# Patient Record
Sex: Female | Born: 1945 | Race: White | Hispanic: No | Marital: Married | Smoking: Former smoker
Health system: Southern US, Community
[De-identification: ages and names within clinical notes are randomized; demographics above are authoritative.]

## PROBLEM LIST (undated history)

## (undated) DIAGNOSIS — J189 Pneumonia, unspecified organism: Secondary | ICD-10-CM

## (undated) DIAGNOSIS — I209 Angina pectoris, unspecified: Secondary | ICD-10-CM

## (undated) DIAGNOSIS — M1990 Unspecified osteoarthritis, unspecified site: Secondary | ICD-10-CM

## (undated) DIAGNOSIS — K5720 Diverticulitis of large intestine with perforation and abscess without bleeding: Secondary | ICD-10-CM

## (undated) DIAGNOSIS — K769 Liver disease, unspecified: Secondary | ICD-10-CM

## (undated) DIAGNOSIS — K5792 Diverticulitis of intestine, part unspecified, without perforation or abscess without bleeding: Secondary | ICD-10-CM

## (undated) DIAGNOSIS — E7800 Pure hypercholesterolemia, unspecified: Secondary | ICD-10-CM

## (undated) DIAGNOSIS — I739 Peripheral vascular disease, unspecified: Secondary | ICD-10-CM

## (undated) DIAGNOSIS — G4730 Sleep apnea, unspecified: Secondary | ICD-10-CM

## (undated) DIAGNOSIS — K219 Gastro-esophageal reflux disease without esophagitis: Secondary | ICD-10-CM

## (undated) DIAGNOSIS — I499 Cardiac arrhythmia, unspecified: Secondary | ICD-10-CM

## (undated) DIAGNOSIS — M797 Fibromyalgia: Secondary | ICD-10-CM

## (undated) DIAGNOSIS — I639 Cerebral infarction, unspecified: Secondary | ICD-10-CM

## (undated) DIAGNOSIS — I2510 Atherosclerotic heart disease of native coronary artery without angina pectoris: Secondary | ICD-10-CM

## (undated) DIAGNOSIS — E119 Type 2 diabetes mellitus without complications: Secondary | ICD-10-CM

## (undated) DIAGNOSIS — I10 Essential (primary) hypertension: Secondary | ICD-10-CM

## (undated) HISTORY — PX: FRACTURE SURGERY: SHX138

## (undated) HISTORY — PX: GASTROSTOMY W/ FEEDING TUBE: SUR642

## (undated) HISTORY — DX: Pure hypercholesterolemia, unspecified: E78.00

## (undated) HISTORY — PX: COLOSTOMY: SHX63

## (undated) HISTORY — PX: CARDIAC CATHETERIZATION: SHX172

## (undated) HISTORY — DX: Diverticulitis of large intestine with perforation and abscess without bleeding: K57.20

## (undated) HISTORY — DX: Diverticulitis of intestine, part unspecified, without perforation or abscess without bleeding: K57.92

---
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
## 1984-06-11 HISTORY — PX: CORONARY ARTERY BYPASS GRAFT: SHX141

## 1994-06-11 HISTORY — PX: CHOLECYSTECTOMY: SHX55

## 1998-07-25 ENCOUNTER — Ambulatory Visit (HOSPITAL_COMMUNITY): Admission: RE | Admit: 1998-07-25 | Discharge: 1998-07-25 | Payer: Self-pay | Admitting: Family Medicine

## 2000-04-02 ENCOUNTER — Other Ambulatory Visit: Admission: RE | Admit: 2000-04-02 | Discharge: 2000-04-02 | Payer: Self-pay | Admitting: Family Medicine

## 2000-06-11 HISTORY — PX: EYE SURGERY: SHX253

## 2000-10-14 ENCOUNTER — Encounter: Payer: Self-pay | Admitting: Family Medicine

## 2000-10-14 ENCOUNTER — Encounter: Admission: RE | Admit: 2000-10-14 | Discharge: 2000-10-14 | Payer: Self-pay | Admitting: Family Medicine

## 2001-03-18 ENCOUNTER — Other Ambulatory Visit: Admission: RE | Admit: 2001-03-18 | Discharge: 2001-03-18 | Payer: Self-pay | Admitting: Family Medicine

## 2001-07-08 ENCOUNTER — Encounter: Payer: Self-pay | Admitting: Family Medicine

## 2001-07-08 ENCOUNTER — Ambulatory Visit (HOSPITAL_COMMUNITY): Admission: RE | Admit: 2001-07-08 | Discharge: 2001-07-08 | Payer: Self-pay | Admitting: Family Medicine

## 2002-01-12 ENCOUNTER — Encounter: Payer: Self-pay | Admitting: Family Medicine

## 2002-01-12 ENCOUNTER — Encounter: Admission: RE | Admit: 2002-01-12 | Discharge: 2002-01-12 | Payer: Self-pay | Admitting: Family Medicine

## 2003-04-02 ENCOUNTER — Encounter: Admission: RE | Admit: 2003-04-02 | Discharge: 2003-04-02 | Payer: Self-pay | Admitting: Family Medicine

## 2003-04-02 ENCOUNTER — Encounter: Payer: Self-pay | Admitting: Family Medicine

## 2003-04-06 ENCOUNTER — Other Ambulatory Visit: Admission: RE | Admit: 2003-04-06 | Discharge: 2003-04-06 | Payer: Self-pay | Admitting: Family Medicine

## 2005-04-09 ENCOUNTER — Other Ambulatory Visit: Admission: RE | Admit: 2005-04-09 | Discharge: 2005-04-09 | Payer: Self-pay | Admitting: Family Medicine

## 2005-09-12 ENCOUNTER — Ambulatory Visit (HOSPITAL_BASED_OUTPATIENT_CLINIC_OR_DEPARTMENT_OTHER): Admission: RE | Admit: 2005-09-12 | Discharge: 2005-09-12 | Payer: Self-pay | Admitting: Orthopedic Surgery

## 2007-01-07 ENCOUNTER — Encounter: Admission: RE | Admit: 2007-01-07 | Discharge: 2007-01-07 | Payer: Self-pay | Admitting: Family Medicine

## 2007-07-10 ENCOUNTER — Other Ambulatory Visit: Admission: RE | Admit: 2007-07-10 | Discharge: 2007-07-10 | Payer: Self-pay | Admitting: Family Medicine

## 2007-10-06 ENCOUNTER — Encounter: Admission: RE | Admit: 2007-10-06 | Discharge: 2007-10-06 | Payer: Self-pay | Admitting: Family Medicine

## 2009-05-03 ENCOUNTER — Encounter (HOSPITAL_COMMUNITY): Admission: RE | Admit: 2009-05-03 | Discharge: 2009-06-10 | Payer: Self-pay | Admitting: Unknown Physician Specialty

## 2009-06-11 DIAGNOSIS — I639 Cerebral infarction, unspecified: Secondary | ICD-10-CM

## 2009-06-11 HISTORY — DX: Cerebral infarction, unspecified: I63.9

## 2009-06-27 ENCOUNTER — Encounter (HOSPITAL_COMMUNITY): Admission: RE | Admit: 2009-06-27 | Discharge: 2009-09-25 | Payer: Self-pay | Admitting: Unknown Physician Specialty

## 2010-10-27 NOTE — Op Note (Signed)
NAME:  [name], CHRETIEN              ACCOUNT NO.:  [id number]   MEDICAL RECORD NO.:  [id number]          PATIENT TYPE:  AMB   LOCATION:  DSC                          FACILITY:  [facility]   PHYSICIAN:  Matthew A. Weingold, M.D.DATE OF BIRTH:  Aug 30, 1945   DATE OF PROCEDURE:  09/12/2005  DATE OF DISCHARGE:                                 OPERATIVE REPORT   PREOPERATIVE DIAGNOSIS:  Left distal radius malunion.   POSTOPERATIVE DIAGNOSIS:  Left distal radius malunion.   PROCEDURE:  Takedown of malunion, left distal radius, and open reduction and  internal fixation with DVR plate and screws.   SURGEONS:  1.  Artist Pais Mina Marble, M.D.  2.  Cindee Salt, M.D.   ANESTHESIA:  Axillary block.   TOURNIQUET TIME:  One hour.   COMPLICATIONS:  No complications.   DRAINS:  No drains.   OPERATIVE REPORT:  The patient was taken to the operating room and after the  induction of adequate axillary block analgesia and IV sedation, the left  upper extremity was prepped and draped in the usual sterile fashion.  An  Esmarch was used to exsanguinate the limb.  Tourniquet was inflated to 250  mmHg.  At this point in time, an FCR approach to the distal radius was  undertaken and the skin was incised for 8 cm over the palpable border of the  FCR tendon.  Skin was incised and sheath over the FCR was incised; this was  retracted to the midline.  The radial artery to the lateral-sided interval  was developed down to the level of the pronator quadratus.  The pronator  quadratus was subperiosteally stripped off the distal radius, exposing a  volarly angulated malunion of the distal radius.  The malunion was taken  down carefully using curettes, rongeurs and osteotomes.  Once this was done,  the DVR plate was fastened to the proximal aspect of the radius and a  reduction was performed.  Intraoperative fluoroscopy revealed adequate  reduction in both lateral and lateral plane.  K-wires were then used to  transfix  the distal fragment to the plate; this was then followed by  placement of the remaining cortical screws proximally and smooth pegs  distally.  Next, cancellous graft was placed into a fairly large void  created by the takedown of the malunion; this was packed tightly until  fluoroscopy revealed no lucency.  The wound was then thoroughly irrigated.  The pronator  quadratus was repaired with 3-0 Vicryl and the skin with a 3-0 Prolene  subcuticular stitch.  Steri-Strips, 4 x 4's, fluffs and a compressive  dressing were applied.  The patient tolerated the procedure well and went to  the recovery room in stable fashion.      Artist Pais Mina Marble, M.D.  Electronically Signed     MAW/MEDQ  D:  09/13/2005  T:  09/14/2005  Job:  [id number]

## 2011-01-10 DIAGNOSIS — K769 Liver disease, unspecified: Secondary | ICD-10-CM

## 2011-01-10 HISTORY — DX: Liver disease, unspecified: K76.9

## 2011-06-12 HISTORY — PX: EYE SURGERY: SHX253

## 2011-08-21 DIAGNOSIS — H409 Unspecified glaucoma: Secondary | ICD-10-CM | POA: Diagnosis not present

## 2011-08-21 DIAGNOSIS — H4011X0 Primary open-angle glaucoma, stage unspecified: Secondary | ICD-10-CM | POA: Diagnosis not present

## 2011-08-23 DIAGNOSIS — I10 Essential (primary) hypertension: Secondary | ICD-10-CM | POA: Diagnosis not present

## 2011-08-23 DIAGNOSIS — Z951 Presence of aortocoronary bypass graft: Secondary | ICD-10-CM | POA: Diagnosis not present

## 2011-08-23 DIAGNOSIS — R0602 Shortness of breath: Secondary | ICD-10-CM | POA: Diagnosis not present

## 2011-08-23 DIAGNOSIS — I2510 Atherosclerotic heart disease of native coronary artery without angina pectoris: Secondary | ICD-10-CM | POA: Diagnosis not present

## 2011-09-03 DIAGNOSIS — I10 Essential (primary) hypertension: Secondary | ICD-10-CM | POA: Diagnosis not present

## 2011-09-03 DIAGNOSIS — I25810 Atherosclerosis of coronary artery bypass graft(s) without angina pectoris: Secondary | ICD-10-CM | POA: Diagnosis not present

## 2011-09-03 DIAGNOSIS — E785 Hyperlipidemia, unspecified: Secondary | ICD-10-CM | POA: Diagnosis not present

## 2011-09-03 DIAGNOSIS — E119 Type 2 diabetes mellitus without complications: Secondary | ICD-10-CM | POA: Diagnosis not present

## 2011-09-05 DIAGNOSIS — R82998 Other abnormal findings in urine: Secondary | ICD-10-CM | POA: Diagnosis not present

## 2011-09-05 DIAGNOSIS — E119 Type 2 diabetes mellitus without complications: Secondary | ICD-10-CM | POA: Diagnosis not present

## 2011-09-05 DIAGNOSIS — H6120 Impacted cerumen, unspecified ear: Secondary | ICD-10-CM | POA: Diagnosis not present

## 2011-09-12 DIAGNOSIS — M1990 Unspecified osteoarthritis, unspecified site: Secondary | ICD-10-CM | POA: Diagnosis not present

## 2011-09-12 DIAGNOSIS — M25549 Pain in joints of unspecified hand: Secondary | ICD-10-CM | POA: Diagnosis not present

## 2011-09-12 DIAGNOSIS — M069 Rheumatoid arthritis, unspecified: Secondary | ICD-10-CM | POA: Diagnosis not present

## 2011-09-21 DIAGNOSIS — R109 Unspecified abdominal pain: Secondary | ICD-10-CM | POA: Diagnosis not present

## 2011-09-21 DIAGNOSIS — R82998 Other abnormal findings in urine: Secondary | ICD-10-CM | POA: Diagnosis not present

## 2011-10-03 DIAGNOSIS — H2510 Age-related nuclear cataract, unspecified eye: Secondary | ICD-10-CM | POA: Diagnosis not present

## 2011-10-10 DIAGNOSIS — H2510 Age-related nuclear cataract, unspecified eye: Secondary | ICD-10-CM | POA: Diagnosis not present

## 2011-10-12 DIAGNOSIS — M069 Rheumatoid arthritis, unspecified: Secondary | ICD-10-CM | POA: Diagnosis not present

## 2011-10-15 DIAGNOSIS — H269 Unspecified cataract: Secondary | ICD-10-CM | POA: Diagnosis not present

## 2011-10-15 DIAGNOSIS — IMO0002 Reserved for concepts with insufficient information to code with codable children: Secondary | ICD-10-CM | POA: Diagnosis not present

## 2011-10-15 DIAGNOSIS — H2510 Age-related nuclear cataract, unspecified eye: Secondary | ICD-10-CM | POA: Diagnosis not present

## 2011-10-22 DIAGNOSIS — H2510 Age-related nuclear cataract, unspecified eye: Secondary | ICD-10-CM | POA: Diagnosis not present

## 2011-10-29 DIAGNOSIS — IMO0002 Reserved for concepts with insufficient information to code with codable children: Secondary | ICD-10-CM | POA: Diagnosis not present

## 2011-10-29 DIAGNOSIS — H269 Unspecified cataract: Secondary | ICD-10-CM | POA: Diagnosis not present

## 2011-10-29 DIAGNOSIS — H2510 Age-related nuclear cataract, unspecified eye: Secondary | ICD-10-CM | POA: Diagnosis not present

## 2011-10-30 DIAGNOSIS — M47814 Spondylosis without myelopathy or radiculopathy, thoracic region: Secondary | ICD-10-CM | POA: Diagnosis not present

## 2011-10-30 DIAGNOSIS — M546 Pain in thoracic spine: Secondary | ICD-10-CM | POA: Diagnosis not present

## 2011-10-30 DIAGNOSIS — M069 Rheumatoid arthritis, unspecified: Secondary | ICD-10-CM | POA: Diagnosis not present

## 2011-11-06 DIAGNOSIS — M545 Low back pain: Secondary | ICD-10-CM | POA: Diagnosis not present

## 2011-11-06 DIAGNOSIS — M546 Pain in thoracic spine: Secondary | ICD-10-CM | POA: Diagnosis not present

## 2011-11-08 DIAGNOSIS — M069 Rheumatoid arthritis, unspecified: Secondary | ICD-10-CM | POA: Diagnosis not present

## 2011-11-08 DIAGNOSIS — M545 Low back pain: Secondary | ICD-10-CM | POA: Diagnosis not present

## 2011-11-08 DIAGNOSIS — Z79899 Other long term (current) drug therapy: Secondary | ICD-10-CM | POA: Diagnosis not present

## 2011-11-08 DIAGNOSIS — M546 Pain in thoracic spine: Secondary | ICD-10-CM | POA: Diagnosis not present

## 2011-11-12 DIAGNOSIS — M069 Rheumatoid arthritis, unspecified: Secondary | ICD-10-CM | POA: Diagnosis not present

## 2011-11-13 DIAGNOSIS — M545 Low back pain: Secondary | ICD-10-CM | POA: Diagnosis not present

## 2011-11-13 DIAGNOSIS — M546 Pain in thoracic spine: Secondary | ICD-10-CM | POA: Diagnosis not present

## 2011-11-15 DIAGNOSIS — M545 Low back pain: Secondary | ICD-10-CM | POA: Diagnosis not present

## 2011-11-15 DIAGNOSIS — M546 Pain in thoracic spine: Secondary | ICD-10-CM | POA: Diagnosis not present

## 2011-11-20 DIAGNOSIS — M546 Pain in thoracic spine: Secondary | ICD-10-CM | POA: Diagnosis not present

## 2011-11-20 DIAGNOSIS — M545 Low back pain: Secondary | ICD-10-CM | POA: Diagnosis not present

## 2011-11-27 DIAGNOSIS — M545 Low back pain: Secondary | ICD-10-CM | POA: Diagnosis not present

## 2011-11-27 DIAGNOSIS — M546 Pain in thoracic spine: Secondary | ICD-10-CM | POA: Diagnosis not present

## 2011-11-30 DIAGNOSIS — M546 Pain in thoracic spine: Secondary | ICD-10-CM | POA: Diagnosis not present

## 2011-11-30 DIAGNOSIS — M545 Low back pain: Secondary | ICD-10-CM | POA: Diagnosis not present

## 2011-12-04 DIAGNOSIS — M546 Pain in thoracic spine: Secondary | ICD-10-CM | POA: Diagnosis not present

## 2011-12-04 DIAGNOSIS — M545 Low back pain: Secondary | ICD-10-CM | POA: Diagnosis not present

## 2011-12-06 DIAGNOSIS — M545 Low back pain: Secondary | ICD-10-CM | POA: Diagnosis not present

## 2011-12-06 DIAGNOSIS — M546 Pain in thoracic spine: Secondary | ICD-10-CM | POA: Diagnosis not present

## 2011-12-12 DIAGNOSIS — M545 Low back pain: Secondary | ICD-10-CM | POA: Diagnosis not present

## 2011-12-12 DIAGNOSIS — M546 Pain in thoracic spine: Secondary | ICD-10-CM | POA: Diagnosis not present

## 2011-12-18 DIAGNOSIS — M546 Pain in thoracic spine: Secondary | ICD-10-CM | POA: Diagnosis not present

## 2011-12-18 DIAGNOSIS — M545 Low back pain: Secondary | ICD-10-CM | POA: Diagnosis not present

## 2011-12-24 DIAGNOSIS — M1990 Unspecified osteoarthritis, unspecified site: Secondary | ICD-10-CM | POA: Diagnosis not present

## 2011-12-24 DIAGNOSIS — M069 Rheumatoid arthritis, unspecified: Secondary | ICD-10-CM | POA: Diagnosis not present

## 2011-12-24 DIAGNOSIS — M542 Cervicalgia: Secondary | ICD-10-CM | POA: Diagnosis not present

## 2011-12-24 DIAGNOSIS — M5030 Other cervical disc degeneration, unspecified cervical region: Secondary | ICD-10-CM | POA: Diagnosis not present

## 2011-12-25 DIAGNOSIS — M546 Pain in thoracic spine: Secondary | ICD-10-CM | POA: Diagnosis not present

## 2011-12-25 DIAGNOSIS — M545 Low back pain: Secondary | ICD-10-CM | POA: Diagnosis not present

## 2011-12-28 DIAGNOSIS — R109 Unspecified abdominal pain: Secondary | ICD-10-CM | POA: Diagnosis not present

## 2011-12-28 DIAGNOSIS — R82998 Other abnormal findings in urine: Secondary | ICD-10-CM | POA: Diagnosis not present

## 2011-12-28 DIAGNOSIS — I10 Essential (primary) hypertension: Secondary | ICD-10-CM | POA: Diagnosis not present

## 2011-12-28 DIAGNOSIS — R0602 Shortness of breath: Secondary | ICD-10-CM | POA: Diagnosis not present

## 2011-12-28 DIAGNOSIS — M069 Rheumatoid arthritis, unspecified: Secondary | ICD-10-CM | POA: Diagnosis not present

## 2011-12-28 DIAGNOSIS — E119 Type 2 diabetes mellitus without complications: Secondary | ICD-10-CM | POA: Diagnosis not present

## 2012-01-03 DIAGNOSIS — R109 Unspecified abdominal pain: Secondary | ICD-10-CM | POA: Diagnosis not present

## 2012-01-03 DIAGNOSIS — K5730 Diverticulosis of large intestine without perforation or abscess without bleeding: Secondary | ICD-10-CM | POA: Diagnosis not present

## 2012-02-28 DIAGNOSIS — I10 Essential (primary) hypertension: Secondary | ICD-10-CM | POA: Diagnosis not present

## 2012-02-28 DIAGNOSIS — E7800 Pure hypercholesterolemia, unspecified: Secondary | ICD-10-CM | POA: Diagnosis not present

## 2012-02-28 DIAGNOSIS — G459 Transient cerebral ischemic attack, unspecified: Secondary | ICD-10-CM | POA: Diagnosis not present

## 2012-02-28 DIAGNOSIS — I2510 Atherosclerotic heart disease of native coronary artery without angina pectoris: Secondary | ICD-10-CM | POA: Diagnosis not present

## 2012-02-28 DIAGNOSIS — Z951 Presence of aortocoronary bypass graft: Secondary | ICD-10-CM | POA: Diagnosis not present

## 2012-02-28 DIAGNOSIS — R0602 Shortness of breath: Secondary | ICD-10-CM | POA: Diagnosis not present

## 2012-03-03 DIAGNOSIS — H43819 Vitreous degeneration, unspecified eye: Secondary | ICD-10-CM | POA: Diagnosis not present

## 2012-03-17 ENCOUNTER — Encounter (INDEPENDENT_AMBULATORY_CARE_PROVIDER_SITE_OTHER): Payer: Medicare Other | Admitting: Ophthalmology

## 2012-03-17 DIAGNOSIS — H43819 Vitreous degeneration, unspecified eye: Secondary | ICD-10-CM

## 2012-03-17 DIAGNOSIS — I10 Essential (primary) hypertension: Secondary | ICD-10-CM

## 2012-03-17 DIAGNOSIS — H4310 Vitreous hemorrhage, unspecified eye: Secondary | ICD-10-CM

## 2012-03-17 DIAGNOSIS — E1165 Type 2 diabetes mellitus with hyperglycemia: Secondary | ICD-10-CM

## 2012-03-17 DIAGNOSIS — H348192 Central retinal vein occlusion, unspecified eye, stable: Secondary | ICD-10-CM

## 2012-03-17 DIAGNOSIS — H35039 Hypertensive retinopathy, unspecified eye: Secondary | ICD-10-CM

## 2012-03-17 DIAGNOSIS — E11319 Type 2 diabetes mellitus with unspecified diabetic retinopathy without macular edema: Secondary | ICD-10-CM

## 2012-03-18 NOTE — H&P (Signed)
[name] is an 66 y.o. female.   Chief Complaint:loss of vision right eye HPI: History of Central retinal vein occlusion right eye  Now sudden loss of vision with vitreous hemorrhage right eye  No past medical history on file.  No past surgical history on file.  No family history on file. Social History:  does not have a smoking history on file. She does not have any smokeless tobacco history on file. Her alcohol and drug histories not on file.  Allergies: Allergies not on file  No prescriptions prior to admission    Review of systems otherwise negative  There were no vitals taken for this visit.  Physical exam: Mental status: oriented x3. Eyes: See eye exam associated with this date of surgery in media tab.  Scanned in by scanning center Ears, Nose, Throat: within normal limits Neck: Within Normal limits General: within normal limits Chest: Within normal limits Breast: deferred Heart: Within normal limits Abdomen: Within normal limits GU: deferred Extremities: within normal limits Skin: within normal limits  Assessment/Plan Vitreous hemorrhage Plan: To [facility] for Pars plana vitrectomy, laser therapy right eye  Sherrie George 03/18/2012, 3:29 PM

## 2012-03-19 ENCOUNTER — Encounter (HOSPITAL_COMMUNITY): Payer: Self-pay | Admitting: Pharmacy Technician

## 2012-03-20 ENCOUNTER — Ambulatory Visit (HOSPITAL_COMMUNITY): Admission: RE | Admit: 2012-03-20 | Payer: Medicare Other | Source: Ambulatory Visit

## 2012-03-20 ENCOUNTER — Encounter (HOSPITAL_COMMUNITY): Payer: Self-pay

## 2012-03-20 ENCOUNTER — Encounter (HOSPITAL_COMMUNITY)
Admission: RE | Admit: 2012-03-20 | Discharge: 2012-03-20 | Disposition: A | Discharge status: Home / Self Care | Payer: Medicare Other | Source: Ambulatory Visit | Attending: Ophthalmology | Admitting: Ophthalmology

## 2012-03-20 DIAGNOSIS — H349 Unspecified retinal vascular occlusion: Secondary | ICD-10-CM | POA: Diagnosis not present

## 2012-03-20 DIAGNOSIS — IMO0001 Reserved for inherently not codable concepts without codable children: Secondary | ICD-10-CM | POA: Diagnosis not present

## 2012-03-20 DIAGNOSIS — F17200 Nicotine dependence, unspecified, uncomplicated: Secondary | ICD-10-CM | POA: Diagnosis not present

## 2012-03-20 DIAGNOSIS — E119 Type 2 diabetes mellitus without complications: Secondary | ICD-10-CM | POA: Diagnosis not present

## 2012-03-20 DIAGNOSIS — I2510 Atherosclerotic heart disease of native coronary artery without angina pectoris: Secondary | ICD-10-CM | POA: Diagnosis not present

## 2012-03-20 DIAGNOSIS — Z23 Encounter for immunization: Secondary | ICD-10-CM | POA: Diagnosis not present

## 2012-03-20 DIAGNOSIS — K219 Gastro-esophageal reflux disease without esophagitis: Secondary | ICD-10-CM | POA: Diagnosis not present

## 2012-03-20 DIAGNOSIS — I739 Peripheral vascular disease, unspecified: Secondary | ICD-10-CM | POA: Diagnosis not present

## 2012-03-20 DIAGNOSIS — Z951 Presence of aortocoronary bypass graft: Secondary | ICD-10-CM | POA: Diagnosis not present

## 2012-03-20 DIAGNOSIS — G4730 Sleep apnea, unspecified: Secondary | ICD-10-CM | POA: Diagnosis not present

## 2012-03-20 DIAGNOSIS — H4310 Vitreous hemorrhage, unspecified eye: Secondary | ICD-10-CM | POA: Diagnosis not present

## 2012-03-20 DIAGNOSIS — I10 Essential (primary) hypertension: Secondary | ICD-10-CM | POA: Diagnosis not present

## 2012-03-20 HISTORY — DX: Liver disease, unspecified: K76.9

## 2012-03-20 HISTORY — DX: Peripheral vascular disease, unspecified: I73.9

## 2012-03-20 HISTORY — DX: Cardiac arrhythmia, unspecified: I49.9

## 2012-03-20 HISTORY — DX: Essential (primary) hypertension: I10

## 2012-03-20 HISTORY — DX: Gastro-esophageal reflux disease without esophagitis: K21.9

## 2012-03-20 HISTORY — DX: Sleep apnea, unspecified: G47.30

## 2012-03-20 HISTORY — DX: Unspecified osteoarthritis, unspecified site: M19.90

## 2012-03-20 HISTORY — DX: Cerebral infarction, unspecified: I63.9

## 2012-03-20 HISTORY — DX: Angina pectoris, unspecified: I20.9

## 2012-03-20 HISTORY — DX: Type 2 diabetes mellitus without complications: E11.9

## 2012-03-20 HISTORY — DX: Fibromyalgia: M79.7

## 2012-03-20 LAB — SURGICAL PCR SCREEN: MRSA, PCR: NEGATIVE

## 2012-03-20 LAB — CBC
HCT: 34.6 % — ABNORMAL LOW (ref 36.0–46.0)
Hemoglobin: 11.7 g/dL — ABNORMAL LOW (ref 12.0–15.0)
MCH: 33.7 pg (ref 26.0–34.0)
MCV: 99.7 fL (ref 78.0–100.0)
RBC: 3.47 MIL/uL — ABNORMAL LOW (ref 3.87–5.11)

## 2012-03-20 LAB — BASIC METABOLIC PANEL
CO2: 25 mEq/L (ref 19–32)
Chloride: 102 mEq/L (ref 96–112)
Glucose, Bld: 133 mg/dL — ABNORMAL HIGH (ref 70–99)
Potassium: 4.8 mEq/L (ref 3.5–5.1)
Sodium: 140 mEq/L (ref 135–145)

## 2012-03-20 NOTE — Pre-Procedure Instructions (Signed)
20 [name]  03/20/2012   Your procedure is scheduled on:  03/25/2012 Tuesday  Report to [facility] Short Stay Center at 0945 AM.  Call this number if you have problems the morning of surgery: [phone]   Remember:   Do not eat food or drink liquids :After Midnight.      Take these medicines the morning of surgery with A SIP OF WATER: cymbalta  nexium  prempro  lortab  Metoprolol     Do not wear jewelry, make-up or nail polish.  Do not wear lotions, powders, or perfumes. You may wear deodorant.  Do not shave 48 hours prior to surgery. Men may shave face and neck.  Do not bring valuables to the hospital.  Contacts, dentures or bridgework may not be worn into surgery.  Leave suitcase in the car. After surgery it may be brought to your room.  For patients admitted to the hospital, checkout time is 11:00 AM the day of discharge.   Patients discharged the day of surgery will not be allowed to drive home.  Name and phone number of your driver: [name] [phone]  Special Instructions: Shower using CHG 2 nights before surgery and the night before surgery.  If you shower the day of surgery use CHG.  Use special wash - you have one bottle of CHG for all showers.  You should use approximately 1/3 of the bottle for each shower.   Please read over the following fact sheets that you were given: Pain Booklet, Coughing and Deep Breathing, Lab Information, MRSA Information and Surgical Site Infection Prevention

## 2012-03-21 ENCOUNTER — Encounter (HOSPITAL_COMMUNITY): Payer: Self-pay | Admitting: Vascular Surgery

## 2012-03-21 NOTE — Consult Note (Signed)
Anesthesia Chart Review:  Patient is a 66 year old female scheduled for pars plana vitrectomy, laser therapy, right eye by Dr. Ashley Royalty on 03/25/12.  History includes morbid obesity, OSA with CPAP use, HTN, GERD, PVD, DM2, CAD s/p CABG '86, TIA, RA, "enlarged liver" by history with normal AST/ALT on 12/28/11.  PCP is Dr. Felipa Eth.  Her Cardiologist is Dr. Jacinto Halim.  He last saw her on 02/28/12.  By notes, she was doing well from a cardiac standpoint.  No changes were made in her medications, and six month follow-up was recommended.  EKG then showed NSR, first degree AVB, low voltage complexes.  Lexiscan myocardial perfusion study from 02/16/2011 showed a very small sized apical ischemia (versus gut artifact) that does not reach statistical significance, normal wall motion and endocardial thickening. EF estimated at 70%. This represents a low risk study. Continued medical therapy was recommended.  Echo at [facility] on 06/29/10 showed no LVH, normal wall motion, LVEF normal and in access of 65%. Mild LA enlargement. Trace MR. No MVP or MS. Mild MAC. No AS or AR, and no LVOT obstruction. Trace TR with no pulmonary hypertension. RVSP 21 mmHg with RA mean 10.  Her last CXR from Dr. Felipa Eth was from 09/16/09.  Since it is > 53 year old, she will need a repeat CXR on the day of surgery.  Labs noted from 03/20/12.  AST was 36 and ALT was 22 on 12/28/11 [facility]), so will not plan to get any additional labs preoperatively.  If no significant change in her status then would anticipate she can proceed as planned.  Shonna Chock, PA-C

## 2012-03-24 MED ORDER — CEFAZOLIN SODIUM-DEXTROSE 2-3 GM-% IV SOLR
2.0000 g | INTRAVENOUS | Status: AC
  Administered 2012-03-25: 2 g via INTRAVENOUS
  Filled 2012-03-24: qty 50

## 2012-03-24 MED ORDER — GATIFLOXACIN 0.5 % OP SOLN
1.0000 [drp] | OPHTHALMIC | Status: AC | PRN
  Administered 2012-03-25 (×3): 1 [drp] via OPHTHALMIC
  Filled 2012-03-24: qty 2.5

## 2012-03-24 MED ORDER — CYCLOPENTOLATE HCL 1 % OP SOLN
1.0000 [drp] | OPHTHALMIC | Status: AC | PRN
  Administered 2012-03-25 (×3): 1 [drp] via OPHTHALMIC
  Filled 2012-03-24: qty 2

## 2012-03-24 MED ORDER — TROPICAMIDE 1 % OP SOLN
1.0000 [drp] | OPHTHALMIC | Status: AC | PRN
  Administered 2012-03-25 (×3): 1 [drp] via OPHTHALMIC
  Filled 2012-03-24: qty 3

## 2012-03-24 MED ORDER — PHENYLEPHRINE HCL 2.5 % OP SOLN
1.0000 [drp] | OPHTHALMIC | Status: AC | PRN
  Administered 2012-03-25 (×3): 1 [drp] via OPHTHALMIC
  Filled 2012-03-24: qty 3

## 2012-03-24 NOTE — Progress Notes (Signed)
Spoke with [facility] state they will fax over sleep study.

## 2012-03-25 ENCOUNTER — Encounter (HOSPITAL_COMMUNITY): Payer: Self-pay | Admitting: Vascular Surgery

## 2012-03-25 ENCOUNTER — Ambulatory Visit (HOSPITAL_COMMUNITY): Payer: Medicare Other

## 2012-03-25 ENCOUNTER — Encounter (HOSPITAL_COMMUNITY): Payer: Self-pay | Admitting: General Practice

## 2012-03-25 ENCOUNTER — Encounter (HOSPITAL_COMMUNITY)
Admission: RE | Disposition: A | Discharge status: Home / Self Care | Payer: Self-pay | Source: Ambulatory Visit | Attending: Ophthalmology

## 2012-03-25 ENCOUNTER — Ambulatory Visit (HOSPITAL_COMMUNITY): Payer: Medicare Other | Admitting: Vascular Surgery

## 2012-03-25 ENCOUNTER — Ambulatory Visit (HOSPITAL_COMMUNITY)
Admission: RE | Admit: 2012-03-25 | Discharge: 2012-03-26 | Disposition: A | Discharge status: Home / Self Care | Payer: Medicare Other | Source: Ambulatory Visit | Attending: Ophthalmology | Admitting: Ophthalmology

## 2012-03-25 DIAGNOSIS — IMO0001 Reserved for inherently not codable concepts without codable children: Secondary | ICD-10-CM | POA: Insufficient documentation

## 2012-03-25 DIAGNOSIS — Z951 Presence of aortocoronary bypass graft: Secondary | ICD-10-CM | POA: Insufficient documentation

## 2012-03-25 DIAGNOSIS — G4710 Hypersomnia, unspecified: Secondary | ICD-10-CM | POA: Diagnosis not present

## 2012-03-25 DIAGNOSIS — K219 Gastro-esophageal reflux disease without esophagitis: Secondary | ICD-10-CM | POA: Diagnosis not present

## 2012-03-25 DIAGNOSIS — G4730 Sleep apnea, unspecified: Secondary | ICD-10-CM | POA: Insufficient documentation

## 2012-03-25 DIAGNOSIS — I739 Peripheral vascular disease, unspecified: Secondary | ICD-10-CM | POA: Insufficient documentation

## 2012-03-25 DIAGNOSIS — H4310 Vitreous hemorrhage, unspecified eye: Secondary | ICD-10-CM | POA: Diagnosis not present

## 2012-03-25 DIAGNOSIS — I2510 Atherosclerotic heart disease of native coronary artery without angina pectoris: Secondary | ICD-10-CM | POA: Insufficient documentation

## 2012-03-25 DIAGNOSIS — I10 Essential (primary) hypertension: Secondary | ICD-10-CM | POA: Insufficient documentation

## 2012-03-25 DIAGNOSIS — Z01811 Encounter for preprocedural respiratory examination: Secondary | ICD-10-CM | POA: Diagnosis not present

## 2012-03-25 DIAGNOSIS — E119 Type 2 diabetes mellitus without complications: Secondary | ICD-10-CM | POA: Diagnosis not present

## 2012-03-25 DIAGNOSIS — H348192 Central retinal vein occlusion, unspecified eye, stable: Secondary | ICD-10-CM | POA: Diagnosis not present

## 2012-03-25 DIAGNOSIS — F17200 Nicotine dependence, unspecified, uncomplicated: Secondary | ICD-10-CM | POA: Insufficient documentation

## 2012-03-25 DIAGNOSIS — H349 Unspecified retinal vascular occlusion: Secondary | ICD-10-CM | POA: Diagnosis not present

## 2012-03-25 HISTORY — PX: PARS PLANA VITRECTOMY: SHX2166

## 2012-03-25 HISTORY — PX: GAS/FLUID EXCHANGE: SHX5334

## 2012-03-25 LAB — GLUCOSE, CAPILLARY
Glucose-Capillary: 134 mg/dL — ABNORMAL HIGH (ref 70–99)
Glucose-Capillary: 257 mg/dL — ABNORMAL HIGH (ref 70–99)

## 2012-03-25 SURGERY — PARS PLANA VITRECTOMY WITH 25 GAUGE
Anesthesia: General | Site: Eye | Laterality: Right | Wound class: Clean

## 2012-03-25 MED ORDER — LIDOCAINE HCL (CARDIAC) 20 MG/ML IV SOLN
INTRAVENOUS | Status: DC | PRN
  Administered 2012-03-25: 20 mg via INTRAVENOUS

## 2012-03-25 MED ORDER — ACETAZOLAMIDE SODIUM 500 MG IJ SOLR
500.0000 mg | Freq: Once | INTRAMUSCULAR | Status: AC
  Administered 2012-03-26: 500 mg via INTRAVENOUS
  Filled 2012-03-25: qty 500

## 2012-03-25 MED ORDER — HYPROMELLOSE (GONIOSCOPIC) 2.5 % OP SOLN
OPHTHALMIC | Status: AC
  Filled 2012-03-25: qty 15

## 2012-03-25 MED ORDER — BSS IO SOLN
INTRAOCULAR | Status: AC
  Filled 2012-03-25: qty 15

## 2012-03-25 MED ORDER — SODIUM CHLORIDE 0.9 % IJ SOLN
INTRAMUSCULAR | Status: DC | PRN
  Administered 2012-03-25: 12:00:00

## 2012-03-25 MED ORDER — GATIFLOXACIN 0.5 % OP SOLN
1.0000 [drp] | Freq: Four times a day (QID) | OPHTHALMIC | Status: DC
  Filled 2012-03-25: qty 2.5

## 2012-03-25 MED ORDER — BUPIVACAINE HCL 0.75 % IJ SOLN
INTRAMUSCULAR | Status: AC
  Filled 2012-03-25: qty 10

## 2012-03-25 MED ORDER — TOFACITINIB CITRATE 5 MG PO TABS: 5.0000 mg | ORAL_TABLET | Freq: Two times a day (BID) | ORAL | Status: DC

## 2012-03-25 MED ORDER — BACITRACIN-POLYMYXIN B 500-10000 UNIT/GM OP OINT
1.0000 "application " | TOPICAL_OINTMENT | Freq: Four times a day (QID) | OPHTHALMIC | Status: DC
  Filled 2012-03-25: qty 3.5

## 2012-03-25 MED ORDER — MORPHINE SULFATE 2 MG/ML IJ SOLN: 1.0000 mg | INTRAMUSCULAR | Status: DC | PRN

## 2012-03-25 MED ORDER — SODIUM CHLORIDE 0.45 % IV SOLN
INTRAVENOUS | Status: DC
  Administered 2012-03-25: 15:00:00 via INTRAVENOUS

## 2012-03-25 MED ORDER — HYDROCODONE-ACETAMINOPHEN 5-325 MG PO TABS
1.0000 | ORAL_TABLET | ORAL | Status: DC | PRN
  Administered 2012-03-25: 2 via ORAL
  Administered 2012-03-25: 1 via ORAL
  Filled 2012-03-25: qty 2
  Filled 2012-03-25: qty 1

## 2012-03-25 MED ORDER — HYALURONIDASE HUMAN 150 UNIT/ML IJ SOLN
INTRAMUSCULAR | Status: AC
  Filled 2012-03-25: qty 1

## 2012-03-25 MED ORDER — METOPROLOL SUCCINATE ER 25 MG PO TB24
25.0000 mg | ORAL_TABLET | Freq: Every day | ORAL | Status: DC
  Filled 2012-03-25: qty 1

## 2012-03-25 MED ORDER — HYDROCODONE-ACETAMINOPHEN 5-325 MG PO TABS: 1.0000 | ORAL_TABLET | ORAL | Status: DC | PRN

## 2012-03-25 MED ORDER — SODIUM CHLORIDE 0.9 % IV SOLN
INTRAVENOUS | Status: DC
  Administered 2012-03-25 (×2): via INTRAVENOUS

## 2012-03-25 MED ORDER — DULOXETINE HCL 60 MG PO CPEP
60.0000 mg | ORAL_CAPSULE | Freq: Every day | ORAL | Status: DC
  Filled 2012-03-25: qty 1

## 2012-03-25 MED ORDER — SPIRONOLACTONE 25 MG PO TABS
25.0000 mg | ORAL_TABLET | Freq: Every day | ORAL | Status: DC
  Filled 2012-03-25: qty 1

## 2012-03-25 MED ORDER — EPINEPHRINE HCL 1 MG/ML IJ SOLN
INTRAOCULAR | Status: DC | PRN
  Administered 2012-03-25: 12:00:00

## 2012-03-25 MED ORDER — AMLODIPINE BESYLATE 5 MG PO TABS
5.0000 mg | ORAL_TABLET | Freq: Every day | ORAL | Status: DC
  Filled 2012-03-25: qty 1

## 2012-03-25 MED ORDER — TETRACAINE HCL 0.5 % OP SOLN
2.0000 [drp] | Freq: Once | OPHTHALMIC | Status: DC
  Filled 2012-03-25: qty 2

## 2012-03-25 MED ORDER — OXYCODONE HCL 5 MG/5ML PO SOLN: 5.0000 mg | Freq: Once | ORAL | Status: DC | PRN

## 2012-03-25 MED ORDER — BRIMONIDINE TARTRATE 0.2 % OP SOLN
1.0000 [drp] | Freq: Two times a day (BID) | OPHTHALMIC | Status: DC
  Filled 2012-03-25: qty 5

## 2012-03-25 MED ORDER — HYDROMORPHONE HCL PF 1 MG/ML IJ SOLN: 0.2500 mg | INTRAMUSCULAR | Status: DC | PRN

## 2012-03-25 MED ORDER — ACETAZOLAMIDE SODIUM 500 MG IJ SOLR
INTRAMUSCULAR | Status: AC
  Filled 2012-03-25: qty 500

## 2012-03-25 MED ORDER — MINERAL OIL LIGHT 100 % EX OIL
TOPICAL_OIL | CUTANEOUS | Status: AC
  Filled 2012-03-25: qty 25

## 2012-03-25 MED ORDER — ASPIRIN EC 81 MG PO TBEC
81.0000 mg | DELAYED_RELEASE_TABLET | Freq: Every day | ORAL | Status: DC
  Filled 2012-03-25 (×2): qty 1

## 2012-03-25 MED ORDER — ATROPINE SULFATE 1 % OP SOLN
OPHTHALMIC | Status: AC
  Filled 2012-03-25: qty 2

## 2012-03-25 MED ORDER — MIDAZOLAM HCL 2 MG/2ML IJ SOLN: 0.5000 mg | Freq: Once | INTRAMUSCULAR | Status: DC | PRN

## 2012-03-25 MED ORDER — SODIUM HYALURONATE 10 MG/ML IO SOLN
INTRAOCULAR | Status: AC
  Filled 2012-03-25: qty 0.85

## 2012-03-25 MED ORDER — ONDANSETRON HCL 4 MG/2ML IJ SOLN: 4.0000 mg | Freq: Four times a day (QID) | INTRAMUSCULAR | Status: DC | PRN

## 2012-03-25 MED ORDER — MAGNESIUM HYDROXIDE 400 MG/5ML PO SUSP: 15.0000 mL | Freq: Four times a day (QID) | ORAL | Status: DC | PRN

## 2012-03-25 MED ORDER — INSULIN ASPART 100 UNIT/ML ~~LOC~~ SOLN
0.0000 [IU] | SUBCUTANEOUS | Status: DC
  Administered 2012-03-25: 5 [IU] via SUBCUTANEOUS
  Administered 2012-03-25: 8 [IU] via SUBCUTANEOUS
  Administered 2012-03-26: 3 [IU] via SUBCUTANEOUS

## 2012-03-25 MED ORDER — BSS PLUS IO SOLN
INTRAOCULAR | Status: AC
  Filled 2012-03-25: qty 500

## 2012-03-25 MED ORDER — INSULIN GLARGINE 100 UNIT/ML ~~LOC~~ SOLN
5.0000 [IU] | Freq: Every day | SUBCUTANEOUS | Status: DC
  Administered 2012-03-25: 5 [IU] via SUBCUTANEOUS

## 2012-03-25 MED ORDER — TAFLUPROST 0.0015 % OP SOLN: 1.0000 [drp] | Freq: Every day | OPHTHALMIC | Status: DC

## 2012-03-25 MED ORDER — HYDROCHLOROTHIAZIDE 25 MG PO TABS
25.0000 mg | ORAL_TABLET | Freq: Every day | ORAL | Status: DC
  Filled 2012-03-25: qty 1

## 2012-03-25 MED ORDER — GENTAMICIN SULFATE 40 MG/ML IJ SOLN
INTRAMUSCULAR | Status: AC
  Filled 2012-03-25: qty 2

## 2012-03-25 MED ORDER — METHOTREXATE 2.5 MG PO TABS: 12.5000 mg | ORAL_TABLET | ORAL | Status: DC

## 2012-03-25 MED ORDER — TEMAZEPAM 15 MG PO CAPS
15.0000 mg | ORAL_CAPSULE | Freq: Every evening | ORAL | Status: DC | PRN
  Administered 2012-03-25: 15 mg via ORAL
  Filled 2012-03-25: qty 1

## 2012-03-25 MED ORDER — PROMETHAZINE HCL 25 MG/ML IJ SOLN
6.2500 mg | INTRAMUSCULAR | Status: DC | PRN
Start: 2012-03-25 — End: 2012-03-25

## 2012-03-25 MED ORDER — INFLUENZA VIRUS VACC SPLIT PF IM SUSP
0.5000 mL | INTRAMUSCULAR | Status: AC
  Administered 2012-03-26: 0.5 mL via INTRAMUSCULAR
  Filled 2012-03-25: qty 0.5

## 2012-03-25 MED ORDER — OXYCODONE HCL 5 MG PO TABS: 5.0000 mg | ORAL_TABLET | Freq: Once | ORAL | Status: DC | PRN

## 2012-03-25 MED ORDER — ACETAMINOPHEN 325 MG PO TABS: 325.0000 mg | ORAL_TABLET | ORAL | Status: DC | PRN

## 2012-03-25 MED ORDER — PREDNISOLONE ACETATE 1 % OP SUSP
1.0000 [drp] | Freq: Four times a day (QID) | OPHTHALMIC | Status: DC
  Filled 2012-03-25: qty 1

## 2012-03-25 MED ORDER — PROPOFOL 10 MG/ML IV BOLUS
INTRAVENOUS | Status: DC | PRN
  Administered 2012-03-25: 30 mg via INTRAVENOUS
  Administered 2012-03-25: 130 mg via INTRAVENOUS

## 2012-03-25 MED ORDER — ATROPINE SULFATE 1 % OP SOLN
OPHTHALMIC | Status: DC | PRN
  Administered 2012-03-25: 1 [drp] via OPHTHALMIC

## 2012-03-25 MED ORDER — EPINEPHRINE HCL 1 MG/ML IJ SOLN
INTRAMUSCULAR | Status: AC
  Filled 2012-03-25: qty 1

## 2012-03-25 MED ORDER — POLYMYXIN B SULFATE 500000 UNITS IJ SOLR
INTRAMUSCULAR | Status: AC
  Filled 2012-03-25: qty 1

## 2012-03-25 MED ORDER — PANTOPRAZOLE SODIUM 40 MG PO TBEC: 40.0000 mg | DELAYED_RELEASE_TABLET | Freq: Every day | ORAL | Status: DC

## 2012-03-25 MED ORDER — PRENATAL 27-0.8 MG PO TABS
1.0000 | ORAL_TABLET | Freq: Every day | ORAL | Status: DC
  Filled 2012-03-25: qty 1

## 2012-03-25 MED ORDER — SUCCINYLCHOLINE CHLORIDE 20 MG/ML IJ SOLN
INTRAMUSCULAR | Status: DC | PRN
  Administered 2012-03-25: 100 mg via INTRAVENOUS

## 2012-03-25 MED ORDER — IRBESARTAN 300 MG PO TABS
300.0000 mg | ORAL_TABLET | Freq: Every day | ORAL | Status: DC
  Filled 2012-03-25: qty 1

## 2012-03-25 MED ORDER — LIDOCAINE HCL 2 % IJ SOLN
INTRAMUSCULAR | Status: AC
  Filled 2012-03-25: qty 20

## 2012-03-25 MED ORDER — ONDANSETRON HCL 4 MG/2ML IJ SOLN
INTRAMUSCULAR | Status: DC | PRN
  Administered 2012-03-25: 4 mg via INTRAVENOUS

## 2012-03-25 MED ORDER — MEPERIDINE HCL 25 MG/ML IJ SOLN: 6.2500 mg | INTRAMUSCULAR | Status: DC | PRN

## 2012-03-25 MED ORDER — BACITRACIN-POLYMYXIN B 500-10000 UNIT/GM OP OINT
TOPICAL_OINTMENT | OPHTHALMIC | Status: AC
  Filled 2012-03-25: qty 3.5

## 2012-03-25 MED ORDER — BUPIVACAINE HCL 0.75 % IJ SOLN
INTRAMUSCULAR | Status: DC | PRN
  Administered 2012-03-25: 10 mL

## 2012-03-25 MED ORDER — WHITE PETROLATUM GEL
Status: AC
  Administered 2012-03-25: 22:00:00
  Filled 2012-03-25: qty 5

## 2012-03-25 MED ORDER — BACITRACIN-POLYMYXIN B 500-10000 UNIT/GM OP OINT
TOPICAL_OINTMENT | OPHTHALMIC | Status: DC | PRN
  Administered 2012-03-25: 1 via OPHTHALMIC

## 2012-03-25 MED ORDER — LATANOPROST 0.005 % OP SOLN
1.0000 [drp] | Freq: Every day | OPHTHALMIC | Status: DC
  Filled 2012-03-25: qty 2.5

## 2012-03-25 MED ORDER — BSS IO SOLN
INTRAOCULAR | Status: DC | PRN
  Administered 2012-03-25: 15 mL via INTRAOCULAR

## 2012-03-25 MED ORDER — TEMAZEPAM 15 MG PO CAPS: 30.0000 mg | ORAL_CAPSULE | Freq: Every day | ORAL | Status: DC

## 2012-03-25 MED ORDER — OLMESARTAN-AMLODIPINE-HCTZ 40-5-25 MG PO TABS: 1.0000 | ORAL_TABLET | Freq: Every day | ORAL | Status: DC

## 2012-03-25 MED ORDER — FENTANYL CITRATE 0.05 MG/ML IJ SOLN
INTRAMUSCULAR | Status: DC | PRN
  Administered 2012-03-25: 100 ug via INTRAVENOUS
  Administered 2012-03-25: 50 ug via INTRAVENOUS

## 2012-03-25 MED ORDER — DOCUSATE SODIUM 100 MG PO CAPS
100.0000 mg | ORAL_CAPSULE | Freq: Two times a day (BID) | ORAL | Status: DC
  Administered 2012-03-25: 100 mg via ORAL
  Filled 2012-03-25: qty 1

## 2012-03-25 MED ORDER — DEXAMETHASONE SODIUM PHOSPHATE 10 MG/ML IJ SOLN
INTRAMUSCULAR | Status: AC
  Filled 2012-03-25: qty 1

## 2012-03-25 SURGICAL SUPPLY — 59 items
APPLICATOR DR MATTHEWS STRL (MISCELLANEOUS) IMPLANT
BLADE EYE CATARACT 19 1.4 BEAV (BLADE) IMPLANT
BLADE MVR KNIFE 19G (BLADE) IMPLANT
BLADE MVR KNIFE 20G (BLADE) IMPLANT
CANNULA DUAL BORE 23G (CANNULA) IMPLANT
CANNULA FLEX TIP 25G (CANNULA) IMPLANT
CLOTH BEACON ORANGE TIMEOUT ST (SAFETY) ×2 IMPLANT
CORDS BIPOLAR (ELECTRODE) IMPLANT
COTTONBALL LRG STERILE PKG (GAUZE/BANDAGES/DRESSINGS) ×6 IMPLANT
DRAPE INCISE 51X51 W/FILM STRL (DRAPES) IMPLANT
DRAPE OPHTHALMIC 77X100 STRL (CUSTOM PROCEDURE TRAY) ×2 IMPLANT
FILTER BLUE MILLIPORE (MISCELLANEOUS) ×2 IMPLANT
FORCEPS ECKARDT ILM 25G SERR (OPHTHALMIC RELATED) IMPLANT
GLOVE SS BIOGEL STRL SZ 6.5 (GLOVE) ×1 IMPLANT
GLOVE SS BIOGEL STRL SZ 7 (GLOVE) ×1 IMPLANT
GLOVE SUPERSENSE BIOGEL SZ 6.5 (GLOVE) ×1
GLOVE SUPERSENSE BIOGEL SZ 7 (GLOVE) ×1
GLOVE SURG 8.5 LATEX PF (GLOVE) ×2 IMPLANT
GOWN STRL NON-REIN LRG LVL3 (GOWN DISPOSABLE) ×6 IMPLANT
ILLUMINATOR CHOW PICK 25GA (MISCELLANEOUS) ×2 IMPLANT
KIT BASIN OR (CUSTOM PROCEDURE TRAY) ×2 IMPLANT
KIT ROOM TURNOVER OR (KITS) ×2 IMPLANT
KNIFE CRESCENT 2.5 55 ANG (BLADE) IMPLANT
LENS BIOM SUPER VIEW SET DISP (OPHTHALMIC RELATED) IMPLANT
MARKER SKIN DUAL TIP RULER LAB (MISCELLANEOUS) IMPLANT
MASK EYE SHIELD (GAUZE/BANDAGES/DRESSINGS) ×2 IMPLANT
MICROPICK 25G (MISCELLANEOUS)
NEEDLE 18GX1X1/2 (RX/OR ONLY) (NEEDLE) ×2 IMPLANT
NEEDLE 25GX 5/8IN NON SAFETY (NEEDLE) ×2 IMPLANT
NEEDLE 27GAX1X1/2 (NEEDLE) IMPLANT
NEEDLE FILTER BLUNT 18X 1/2SAF (NEEDLE) ×1
NEEDLE FILTER BLUNT 18X1 1/2 (NEEDLE) ×1 IMPLANT
NEEDLE HYPO 30X.5 LL (NEEDLE) ×6 IMPLANT
NS IRRIG 1000ML POUR BTL (IV SOLUTION) ×2 IMPLANT
PACK VITRECTOMY CUSTOM (CUSTOM PROCEDURE TRAY) ×2 IMPLANT
PAD ARMBOARD 7.5X6 YLW CONV (MISCELLANEOUS) ×4 IMPLANT
PAD EYE OVAL STERILE LF (GAUZE/BANDAGES/DRESSINGS) ×2 IMPLANT
PAK VITRECTOMY PIK 25 GA (OPHTHALMIC RELATED) ×2 IMPLANT
PENCIL BIPOLAR 25GA STR DISP (OPHTHALMIC RELATED) IMPLANT
PICK MICROPICK 25G (MISCELLANEOUS) IMPLANT
PROBE DIRECTIONAL LASER (MISCELLANEOUS) ×2 IMPLANT
PROBE VITREOUS 25+ ACCURUS (MISCELLANEOUS) ×2 IMPLANT
ROLLS DENTAL (MISCELLANEOUS) ×4 IMPLANT
SPONGE SURGIFOAM ABS GEL 12-7 (HEMOSTASIS) ×2 IMPLANT
STOPCOCK 4 WAY LG BORE MALE ST (IV SETS) IMPLANT
SUT CHROMIC 7 0 TG140 8 (SUTURE) IMPLANT
SUT ETHILON 10 0 CS140 6 (SUTURE) IMPLANT
SUT ETHILON 9 0 TG140 8 (SUTURE) IMPLANT
SYR 20CC LL (SYRINGE) ×2 IMPLANT
SYR 5ML LL (SYRINGE) IMPLANT
SYR BULB 3OZ (MISCELLANEOUS) ×2 IMPLANT
SYR TB 1ML LUER SLIP (SYRINGE) ×2 IMPLANT
SYRINGE 10CC LL (SYRINGE) IMPLANT
TAPE SURG TRANSPORE 1 IN (GAUZE/BANDAGES/DRESSINGS) ×1 IMPLANT
TAPE SURGICAL TRANSPORE 1 IN (GAUZE/BANDAGES/DRESSINGS) ×1
TOWEL OR 17X24 6PK STRL BLUE (TOWEL DISPOSABLE) ×4 IMPLANT
TROCAR CANNULA 25GA (CANNULA) ×2 IMPLANT
WATER STERILE IRR 1000ML POUR (IV SOLUTION) ×2 IMPLANT
WIPE INSTRUMENT VISIWIPE 73X73 (MISCELLANEOUS) ×2 IMPLANT

## 2012-03-25 NOTE — H&P (Signed)
I examined the patient today and there is no change in the medical status 

## 2012-03-25 NOTE — Anesthesia Preprocedure Evaluation (Addendum)
Anesthesia Evaluation  Patient identified by MRN, date of birth, ID band Patient awake    Reviewed: Allergy & Precautions, H&P , NPO status , Patient's Chart, lab work & pertinent test results, reviewed documented beta blocker date and time   History of Anesthesia Complications Negative for: history of anesthetic complications  Airway Mallampati: II TM Distance: >3 FB Neck ROM: Full    Dental  (+) Edentulous Upper and Edentulous Lower   Pulmonary sleep apnea and Continuous Positive Airway Pressure Ventilation , former smoker (quit 30 year ago),  breath sounds clear to auscultation  Pulmonary exam normal       Cardiovascular hypertension, Pt. on medications and Pt. on home beta blockers - angina+ CAD, + CABG and + Peripheral Vascular Disease - dysrhythmias Rhythm:Regular Rate:Normal  '12 myoview: small apical ischemia vs artifact, EF 70%, low risk scan   Neuro/Psych TIAnegative psych ROS   GI/Hepatic Neg liver ROS, GERD-  Medicated and Controlled,  Endo/Other  diabetes (diet controlled, glu 134), Well Controlled, Type obesity  Renal/GU negative Renal ROS     Musculoskeletal  (+) Fibromyalgia -, narcotic dependent  Abdominal (+) + obese,   Peds  Hematology   Anesthesia Other Findings   Reproductive/Obstetrics                          Anesthesia Physical Anesthesia Plan  ASA: III  Anesthesia Plan: General   Post-op Pain Management:    Induction: Intravenous  Airway Management Planned: Oral ETT  Additional Equipment:   Intra-op Plan:   Post-operative Plan: Extubation in OR  Informed Consent: I have reviewed the patients History and Physical, chart, labs and discussed the procedure including the risks, benefits and alternatives for the proposed anesthesia with the patient or authorized representative who has indicated his/her understanding and acceptance.   Dental advisory  given  Plan Discussed with: CRNA and Surgeon  Anesthesia Plan Comments: (Plan routine monitors, GETA)        Anesthesia Quick Evaluation

## 2012-03-25 NOTE — Progress Notes (Signed)
Dentures returned to pt per pts request 

## 2012-03-25 NOTE — Brief Op Note (Signed)
Brief Operative note   Preoperative diagnosis:  Pre-Op Diagnosis Codes:    * Vitreous hemorrhage [379.23] Postoperative diagnosis  Post-Op Diagnosis Codes:    * Vitreous hemorrhage [379.23]  Procedures: Pars plana vitrectomy right eye with laser and gas injection  Surgeon:  Sherrie George, MD...  Assistant:  Rosalie Doctor SA   Anesthesia: General  Specimen: none  Estimated blood loss:  1cc  Complications: none  Patient sent to PACU in good condition  Composed by Sherrie George MD  Dictation number: [id number]

## 2012-03-25 NOTE — Transfer of Care (Signed)
Immediate Anesthesia Transfer of Care Note  Patient: [name]  Procedure(s) Performed: Procedure(s) (LRB) with comments: PARS PLANA VITRECTOMY WITH 25 GAUGE (Right) GAS/FLUID EXCHANGE (Right) PHOTOCOAGULATION WITH LASER (Right)  Patient Location: PACU  Anesthesia Type: General  Level of Consciousness: awake, alert  and oriented  Airway & Oxygen Therapy: Patient Spontanous Breathing and Patient connected to nasal cannula oxygen  Post-op Assessment: Report given to PACU RN and Post -op Vital signs reviewed and stable  Post vital signs: Reviewed and stable  Complications: No apparent anesthesia complications

## 2012-03-25 NOTE — Anesthesia Procedure Notes (Signed)
Procedure Name: Intubation Date/Time: 03/25/2012 12:03 PM Performed by: Arlice Colt B Pre-anesthesia Checklist: Patient identified, Emergency Drugs available, Suction available, Patient being monitored and Timeout performed Patient Re-evaluated:Patient Re-evaluated prior to inductionOxygen Delivery Method: Circle system utilized Preoxygenation: Pre-oxygenation with 100% oxygen Intubation Type: IV induction and Rapid sequence Laryngoscope Size: Mac and 3 Grade View: Grade I Tube type: Oral Tube size: 7.5 mm Number of attempts: 1 Airway Equipment and Method: Stylet Placement Confirmation: ETT inserted through vocal cords under direct vision,  positive ETCO2 and breath sounds checked- equal and bilateral Secured at: 21 cm Tube secured with: Tape Dental Injury: Teeth and Oropharynx as per pre-operative assessment

## 2012-03-25 NOTE — Anesthesia Postprocedure Evaluation (Signed)
  Anesthesia Post-op Note  Patient: [name]  Procedure(s) Performed: Procedure(s) (LRB) with comments: PARS PLANA VITRECTOMY WITH 25 GAUGE (Right) GAS/FLUID EXCHANGE (Right) PHOTOCOAGULATION WITH LASER (Right)  Patient Location: PACU  Anesthesia Type: General  Level of Consciousness: awake, alert , oriented and patient cooperative  Airway and Oxygen Therapy: Patient Spontanous Breathing  Post-op Pain: none  Post-op Assessment: Post-op Vital signs reviewed, Patient's Cardiovascular Status Stable, Respiratory Function Stable, Patent Airway, No signs of Nausea or vomiting and Pain level controlled  Post-op Vital Signs: Reviewed and stable  Complications: No apparent anesthesia complications

## 2012-03-26 ENCOUNTER — Encounter (HOSPITAL_COMMUNITY): Payer: Self-pay | Admitting: Ophthalmology

## 2012-03-26 LAB — GLUCOSE, CAPILLARY

## 2012-03-26 MED ORDER — BACITRACIN-POLYMYXIN B 500-10000 UNIT/GM OP OINT: 1.0000 "application " | TOPICAL_OINTMENT | Freq: Four times a day (QID) | OPHTHALMIC | Status: DC

## 2012-03-26 MED ORDER — GATIFLOXACIN 0.5 % OP SOLN: 1.0000 [drp] | Freq: Four times a day (QID) | OPHTHALMIC | Status: DC

## 2012-03-26 MED ORDER — LATANOPROST 0.005 % OP SOLN: 1.0000 [drp] | Freq: Every day | OPHTHALMIC | Status: DC

## 2012-03-26 MED ORDER — BRIMONIDINE TARTRATE 0.2 % OP SOLN: 1.0000 [drp] | Freq: Two times a day (BID) | OPHTHALMIC | Status: DC

## 2012-03-26 MED ORDER — PREDNISOLONE ACETATE 1 % OP SUSP: 1.0000 [drp] | Freq: Four times a day (QID) | OPHTHALMIC | Status: DC

## 2012-03-26 NOTE — Op Note (Signed)
NAMENNENNA, MEADOR NO.:  [id number]  MEDICAL RECORD NO.:  [id number]  LOCATION:  [id number]                        FACILITY:  [facility]  PHYSICIAN:  Ainslie Mazurek D. Ashley Royalty, M.D. DATE OF BIRTH:  1945/06/30  DATE OF PROCEDURE:  03/25/2012 DATE OF DISCHARGE:                              OPERATIVE REPORT   ADMISSION DIAGNOSES:  Vitreous hemorrhage, central retinal vein occlusion, right eye.  PROCEDURES:  Pars plana vitrectomy with retinal photocoagulation gas fluid exchange, right eye.  SURGEON:  Beulah Gandy. Ashley Royalty, M.D.  ASSISTANT:  Rosalie Doctor, SA.  ANESTHESIA:  General.  DETAILS:  Usual prep and drape, 25-gauge trocars placed at 8, 10, and 2 o'clock.  Provisc placed on the corneal surface and the biome viewing system was moved into place.  Infusion was placed at 8 o'clock.  Lighted pick and the cutter were placed at 10 and 2 o'clock respectively.  Pars plana vitrectomy was begun just behind the pseudophakos where dark red blood was encountered.  This was carefully removed under low suction and rapid cutting.  The vitrectomy was carried down to the macular surface and core vitrectomy was performed.  Some previous laser marks were seen. The vitrectomy was carried in the mid periphery where surface proliferation was removed with the vitreous cutter and the pick.  The vitrectomy is carried to the far periphery where the vitreous base was examined and all blood was removed from the vitreous base.  Scleral depression was used at 6 o'clock.  The wide field viewing system was then moved into place.  Additional vitrectomy was carried out removing all vitreous from the anterior vitreous areas.  Once this was accomplished, the endolaser was positioned in the eye, 1219 burns were placed around the periphery.  The power was 1000 mW 1000 microns each and 0.1 seconds each.  A 30% gas fluid exchange was performed.  The instruments were removed from the eye.  The 25-gauge trocars  were removed.  The wounds were held until tight and tested until secure. Polymyxin and gentamicin were irrigated into tenon space, atropine solution was applied.  Closing pressure was 10 with a Risk manager. Marcaine was injected around the globe for postop pain.  Decadron 10 mg was injected into the lower subconjunctival space.  Polysporin ophthalmic ointment and patch and shield were placed.  The patient was awakened and taken to recovery in satisfactory condition.  COMPLICATIONS:  None.  DURATION:  1 hour.     Beulah Gandy. Ashley Royalty, M.D.     JDM/MEDQ  D:  03/25/2012  T:  03/26/2012  Job:  [id number]

## 2012-03-26 NOTE — Progress Notes (Signed)
Patient discharged to home with husband.  Discharge teaching completed including follow up care, medications, activity and eye care.  Verbalizes understanding with no further questions.  Vital signs stable, no complaints of pain.

## 2012-03-26 NOTE — Discharge Summary (Signed)
Discharge summary not needed on [facility] patients per medical records. 

## 2012-03-26 NOTE — Progress Notes (Signed)
03/26/2012, 6:42 AM  Mental Status:  Awake, Alert, Oriented  Anterior segment: Cornea  Clear    Anterior Chamber Clear    Lens:    IOL  Intra Ocular Pressure 28 mmHg with Tonopen  Vitreous: Clear 30%gas bubble   Retina:  Attached Good laser reaction   Impression: Excellent result Retina attached   Final Diagnosis: Active Problems:  Vitreous hemorrhage   Plan: start post operative eye drops.  Discharge to home.  Give post operative instructions  Sherrie George 03/26/2012, 6:42 AM

## 2012-04-01 DIAGNOSIS — Z23 Encounter for immunization: Secondary | ICD-10-CM | POA: Diagnosis not present

## 2012-04-01 DIAGNOSIS — M546 Pain in thoracic spine: Secondary | ICD-10-CM | POA: Diagnosis not present

## 2012-04-01 DIAGNOSIS — M069 Rheumatoid arthritis, unspecified: Secondary | ICD-10-CM | POA: Diagnosis not present

## 2012-04-02 ENCOUNTER — Inpatient Hospital Stay (INDEPENDENT_AMBULATORY_CARE_PROVIDER_SITE_OTHER): Payer: Medicare Other | Admitting: Ophthalmology

## 2012-04-02 DIAGNOSIS — H348192 Central retinal vein occlusion, unspecified eye, stable: Secondary | ICD-10-CM

## 2012-04-17 DIAGNOSIS — R5383 Other fatigue: Secondary | ICD-10-CM | POA: Diagnosis not present

## 2012-04-17 DIAGNOSIS — R5381 Other malaise: Secondary | ICD-10-CM | POA: Diagnosis not present

## 2012-04-17 DIAGNOSIS — M1990 Unspecified osteoarthritis, unspecified site: Secondary | ICD-10-CM | POA: Diagnosis not present

## 2012-04-21 DIAGNOSIS — Z1331 Encounter for screening for depression: Secondary | ICD-10-CM | POA: Diagnosis not present

## 2012-04-21 DIAGNOSIS — R5381 Other malaise: Secondary | ICD-10-CM | POA: Diagnosis not present

## 2012-04-21 DIAGNOSIS — R5383 Other fatigue: Secondary | ICD-10-CM | POA: Diagnosis not present

## 2012-04-21 DIAGNOSIS — E119 Type 2 diabetes mellitus without complications: Secondary | ICD-10-CM | POA: Diagnosis not present

## 2012-04-21 DIAGNOSIS — E039 Hypothyroidism, unspecified: Secondary | ICD-10-CM | POA: Diagnosis not present

## 2012-04-23 ENCOUNTER — Encounter (INDEPENDENT_AMBULATORY_CARE_PROVIDER_SITE_OTHER): Payer: Medicare Other | Admitting: Ophthalmology

## 2012-05-01 ENCOUNTER — Encounter (INDEPENDENT_AMBULATORY_CARE_PROVIDER_SITE_OTHER): Payer: Medicare Other | Admitting: Ophthalmology

## 2012-05-01 DIAGNOSIS — H348192 Central retinal vein occlusion, unspecified eye, stable: Secondary | ICD-10-CM

## 2012-05-05 DIAGNOSIS — M069 Rheumatoid arthritis, unspecified: Secondary | ICD-10-CM | POA: Diagnosis not present

## 2012-05-05 DIAGNOSIS — M1990 Unspecified osteoarthritis, unspecified site: Secondary | ICD-10-CM | POA: Diagnosis not present

## 2012-05-20 DIAGNOSIS — R109 Unspecified abdominal pain: Secondary | ICD-10-CM | POA: Diagnosis not present

## 2012-05-26 DIAGNOSIS — B029 Zoster without complications: Secondary | ICD-10-CM | POA: Diagnosis not present

## 2012-07-10 ENCOUNTER — Encounter (INDEPENDENT_AMBULATORY_CARE_PROVIDER_SITE_OTHER): Payer: Medicare Other | Admitting: Ophthalmology

## 2012-07-31 ENCOUNTER — Encounter (INDEPENDENT_AMBULATORY_CARE_PROVIDER_SITE_OTHER): Payer: Medicare Other | Admitting: Ophthalmology

## 2012-07-31 DIAGNOSIS — R109 Unspecified abdominal pain: Secondary | ICD-10-CM | POA: Diagnosis not present

## 2012-07-31 DIAGNOSIS — J209 Acute bronchitis, unspecified: Secondary | ICD-10-CM | POA: Diagnosis not present

## 2012-09-02 DIAGNOSIS — E119 Type 2 diabetes mellitus without complications: Secondary | ICD-10-CM | POA: Diagnosis not present

## 2012-09-02 DIAGNOSIS — I25810 Atherosclerosis of coronary artery bypass graft(s) without angina pectoris: Secondary | ICD-10-CM | POA: Diagnosis not present

## 2012-09-02 DIAGNOSIS — M069 Rheumatoid arthritis, unspecified: Secondary | ICD-10-CM | POA: Diagnosis not present

## 2012-09-02 DIAGNOSIS — I10 Essential (primary) hypertension: Secondary | ICD-10-CM | POA: Diagnosis not present

## 2012-09-02 DIAGNOSIS — K219 Gastro-esophageal reflux disease without esophagitis: Secondary | ICD-10-CM | POA: Diagnosis not present

## 2012-09-02 DIAGNOSIS — E039 Hypothyroidism, unspecified: Secondary | ICD-10-CM | POA: Diagnosis not present

## 2012-09-03 DIAGNOSIS — R82998 Other abnormal findings in urine: Secondary | ICD-10-CM | POA: Diagnosis not present

## 2012-09-03 DIAGNOSIS — I10 Essential (primary) hypertension: Secondary | ICD-10-CM | POA: Diagnosis not present

## 2012-09-03 DIAGNOSIS — E785 Hyperlipidemia, unspecified: Secondary | ICD-10-CM | POA: Diagnosis not present

## 2012-11-04 DIAGNOSIS — M069 Rheumatoid arthritis, unspecified: Secondary | ICD-10-CM | POA: Diagnosis not present

## 2012-11-04 DIAGNOSIS — B359 Dermatophytosis, unspecified: Secondary | ICD-10-CM | POA: Diagnosis not present

## 2012-11-04 DIAGNOSIS — M1990 Unspecified osteoarthritis, unspecified site: Secondary | ICD-10-CM | POA: Diagnosis not present

## 2012-11-21 DIAGNOSIS — M069 Rheumatoid arthritis, unspecified: Secondary | ICD-10-CM | POA: Diagnosis not present

## 2012-12-02 DIAGNOSIS — J13 Pneumonia due to Streptococcus pneumoniae: Secondary | ICD-10-CM | POA: Diagnosis not present

## 2012-12-02 DIAGNOSIS — I10 Essential (primary) hypertension: Secondary | ICD-10-CM | POA: Diagnosis not present

## 2012-12-02 DIAGNOSIS — J309 Allergic rhinitis, unspecified: Secondary | ICD-10-CM | POA: Diagnosis not present

## 2012-12-11 DIAGNOSIS — M1990 Unspecified osteoarthritis, unspecified site: Secondary | ICD-10-CM | POA: Diagnosis not present

## 2012-12-11 DIAGNOSIS — M069 Rheumatoid arthritis, unspecified: Secondary | ICD-10-CM | POA: Diagnosis not present

## 2013-01-07 DIAGNOSIS — H524 Presbyopia: Secondary | ICD-10-CM | POA: Diagnosis not present

## 2013-01-07 DIAGNOSIS — I10 Essential (primary) hypertension: Secondary | ICD-10-CM | POA: Diagnosis not present

## 2013-01-07 DIAGNOSIS — H35039 Hypertensive retinopathy, unspecified eye: Secondary | ICD-10-CM | POA: Diagnosis not present

## 2013-01-14 DIAGNOSIS — R0609 Other forms of dyspnea: Secondary | ICD-10-CM | POA: Diagnosis not present

## 2013-01-14 DIAGNOSIS — R0989 Other specified symptoms and signs involving the circulatory and respiratory systems: Secondary | ICD-10-CM | POA: Diagnosis not present

## 2013-01-14 DIAGNOSIS — E039 Hypothyroidism, unspecified: Secondary | ICD-10-CM | POA: Diagnosis not present

## 2013-01-14 DIAGNOSIS — IMO0001 Reserved for inherently not codable concepts without codable children: Secondary | ICD-10-CM | POA: Diagnosis not present

## 2013-01-14 DIAGNOSIS — M069 Rheumatoid arthritis, unspecified: Secondary | ICD-10-CM | POA: Diagnosis not present

## 2013-01-14 DIAGNOSIS — I10 Essential (primary) hypertension: Secondary | ICD-10-CM | POA: Diagnosis not present

## 2013-01-14 DIAGNOSIS — R0602 Shortness of breath: Secondary | ICD-10-CM | POA: Diagnosis not present

## 2013-01-14 DIAGNOSIS — I25810 Atherosclerosis of coronary artery bypass graft(s) without angina pectoris: Secondary | ICD-10-CM | POA: Diagnosis not present

## 2013-01-14 DIAGNOSIS — E1169 Type 2 diabetes mellitus with other specified complication: Secondary | ICD-10-CM | POA: Diagnosis not present

## 2013-02-11 DIAGNOSIS — M1990 Unspecified osteoarthritis, unspecified site: Secondary | ICD-10-CM | POA: Diagnosis not present

## 2013-02-11 DIAGNOSIS — M069 Rheumatoid arthritis, unspecified: Secondary | ICD-10-CM | POA: Diagnosis not present

## 2013-02-11 DIAGNOSIS — M25569 Pain in unspecified knee: Secondary | ICD-10-CM | POA: Diagnosis not present

## 2013-02-12 DIAGNOSIS — IMO0001 Reserved for inherently not codable concepts without codable children: Secondary | ICD-10-CM | POA: Diagnosis not present

## 2013-02-12 DIAGNOSIS — I10 Essential (primary) hypertension: Secondary | ICD-10-CM | POA: Diagnosis not present

## 2013-02-12 DIAGNOSIS — Z6841 Body Mass Index (BMI) 40.0 and over, adult: Secondary | ICD-10-CM | POA: Diagnosis not present

## 2013-02-12 DIAGNOSIS — M069 Rheumatoid arthritis, unspecified: Secondary | ICD-10-CM | POA: Diagnosis not present

## 2013-03-03 DIAGNOSIS — M069 Rheumatoid arthritis, unspecified: Secondary | ICD-10-CM | POA: Diagnosis not present

## 2013-03-03 DIAGNOSIS — M2550 Pain in unspecified joint: Secondary | ICD-10-CM | POA: Diagnosis not present

## 2013-03-03 DIAGNOSIS — M47817 Spondylosis without myelopathy or radiculopathy, lumbosacral region: Secondary | ICD-10-CM | POA: Diagnosis not present

## 2013-03-03 DIAGNOSIS — G894 Chronic pain syndrome: Secondary | ICD-10-CM | POA: Diagnosis not present

## 2013-03-19 DIAGNOSIS — M47814 Spondylosis without myelopathy or radiculopathy, thoracic region: Secondary | ICD-10-CM | POA: Diagnosis not present

## 2013-04-16 DIAGNOSIS — M546 Pain in thoracic spine: Secondary | ICD-10-CM | POA: Diagnosis not present

## 2013-04-16 DIAGNOSIS — G894 Chronic pain syndrome: Secondary | ICD-10-CM | POA: Diagnosis not present

## 2013-04-20 ENCOUNTER — Other Ambulatory Visit (HOSPITAL_COMMUNITY): Payer: Self-pay | Admitting: Pain Medicine

## 2013-04-20 DIAGNOSIS — M47814 Spondylosis without myelopathy or radiculopathy, thoracic region: Secondary | ICD-10-CM

## 2013-04-28 ENCOUNTER — Ambulatory Visit (HOSPITAL_COMMUNITY)
Admission: RE | Admit: 2013-04-28 | Discharge: 2013-04-28 | Disposition: A | Discharge status: Home / Self Care | Payer: Medicare Other | Source: Ambulatory Visit | Attending: Pain Medicine | Admitting: Pain Medicine

## 2013-04-28 DIAGNOSIS — M546 Pain in thoracic spine: Secondary | ICD-10-CM | POA: Insufficient documentation

## 2013-04-28 DIAGNOSIS — M47814 Spondylosis without myelopathy or radiculopathy, thoracic region: Secondary | ICD-10-CM

## 2013-04-28 DIAGNOSIS — IMO0002 Reserved for concepts with insufficient information to code with codable children: Secondary | ICD-10-CM | POA: Insufficient documentation

## 2013-04-28 DIAGNOSIS — M5124 Other intervertebral disc displacement, thoracic region: Secondary | ICD-10-CM | POA: Insufficient documentation

## 2013-04-30 DIAGNOSIS — IMO0002 Reserved for concepts with insufficient information to code with codable children: Secondary | ICD-10-CM | POA: Diagnosis not present

## 2013-05-12 DIAGNOSIS — R0609 Other forms of dyspnea: Secondary | ICD-10-CM | POA: Diagnosis not present

## 2013-05-12 DIAGNOSIS — R5381 Other malaise: Secondary | ICD-10-CM | POA: Diagnosis not present

## 2013-05-12 DIAGNOSIS — R0989 Other specified symptoms and signs involving the circulatory and respiratory systems: Secondary | ICD-10-CM | POA: Diagnosis not present

## 2013-05-12 DIAGNOSIS — Z79899 Other long term (current) drug therapy: Secondary | ICD-10-CM | POA: Diagnosis not present

## 2013-05-12 DIAGNOSIS — K219 Gastro-esophageal reflux disease without esophagitis: Secondary | ICD-10-CM | POA: Diagnosis not present

## 2013-05-12 DIAGNOSIS — I10 Essential (primary) hypertension: Secondary | ICD-10-CM | POA: Diagnosis not present

## 2013-05-12 DIAGNOSIS — R82998 Other abnormal findings in urine: Secondary | ICD-10-CM | POA: Diagnosis not present

## 2013-05-12 DIAGNOSIS — E039 Hypothyroidism, unspecified: Secondary | ICD-10-CM | POA: Diagnosis not present

## 2013-05-12 DIAGNOSIS — IMO0001 Reserved for inherently not codable concepts without codable children: Secondary | ICD-10-CM | POA: Diagnosis not present

## 2013-05-12 DIAGNOSIS — E119 Type 2 diabetes mellitus without complications: Secondary | ICD-10-CM | POA: Diagnosis not present

## 2013-05-12 DIAGNOSIS — R0902 Hypoxemia: Secondary | ICD-10-CM | POA: Diagnosis not present

## 2013-05-13 ENCOUNTER — Other Ambulatory Visit (HOSPITAL_COMMUNITY): Payer: Self-pay | Admitting: Internal Medicine

## 2013-05-13 DIAGNOSIS — R0902 Hypoxemia: Secondary | ICD-10-CM

## 2013-05-13 LAB — PULMONARY FUNCTION TEST
DL/VA: 3.7 ml/min/mmHg/L
DLCO cor % pred: 61 %
DLCO cor: 15.69 ml/min/mmHg
DLCO unc % pred: 61 %
DLCO unc: 15.69 ml/min/mmHg
FEF 25-75 Post: 4.28 L/sec
FEF 25-75 Pre: 4.16 L/sec
FEF2575-%Pred-Pre: 206 %
FEV6-%Change-Post: -1 %
FEV6-%Pred-Post: 100 %
FEV6-%Pred-Pre: 101 %
FEV6-Pre: 3.09 L
FEV6FVC-%Pred-Post: 104 %
FVC-%Change-Post: -1 %
FVC-%Pred-Post: 96 %
FVC-Post: 3.04 L
FVC-Pre: 3.09 L
Post FEV6/FVC ratio: 100 %
RV % pred: 71 %

## 2013-05-18 DIAGNOSIS — R0902 Hypoxemia: Secondary | ICD-10-CM | POA: Diagnosis not present

## 2013-05-18 DIAGNOSIS — R109 Unspecified abdominal pain: Secondary | ICD-10-CM | POA: Diagnosis not present

## 2013-05-18 DIAGNOSIS — Z1331 Encounter for screening for depression: Secondary | ICD-10-CM | POA: Diagnosis not present

## 2013-05-18 DIAGNOSIS — IMO0001 Reserved for inherently not codable concepts without codable children: Secondary | ICD-10-CM | POA: Diagnosis not present

## 2013-05-18 DIAGNOSIS — Z6841 Body Mass Index (BMI) 40.0 and over, adult: Secondary | ICD-10-CM | POA: Diagnosis not present

## 2013-05-18 DIAGNOSIS — M069 Rheumatoid arthritis, unspecified: Secondary | ICD-10-CM | POA: Diagnosis not present

## 2013-05-18 DIAGNOSIS — I25810 Atherosclerosis of coronary artery bypass graft(s) without angina pectoris: Secondary | ICD-10-CM | POA: Diagnosis not present

## 2013-05-20 ENCOUNTER — Ambulatory Visit (HOSPITAL_COMMUNITY)
Admission: RE | Admit: 2013-05-20 | Discharge: 2013-05-20 | Disposition: A | Discharge status: Home / Self Care | Payer: Medicare Other | Source: Ambulatory Visit | Attending: Internal Medicine | Admitting: Internal Medicine

## 2013-05-20 DIAGNOSIS — R0902 Hypoxemia: Secondary | ICD-10-CM | POA: Diagnosis not present

## 2013-05-20 MED ORDER — ALBUTEROL SULFATE (5 MG/ML) 0.5% IN NEBU
2.5000 mg | INHALATION_SOLUTION | Freq: Once | RESPIRATORY_TRACT | Status: AC
  Administered 2013-05-20: 2.5 mg via RESPIRATORY_TRACT

## 2013-05-28 DIAGNOSIS — R0602 Shortness of breath: Secondary | ICD-10-CM | POA: Diagnosis not present

## 2013-05-28 DIAGNOSIS — R5381 Other malaise: Secondary | ICD-10-CM | POA: Diagnosis not present

## 2013-05-28 DIAGNOSIS — Z951 Presence of aortocoronary bypass graft: Secondary | ICD-10-CM | POA: Diagnosis not present

## 2013-05-28 DIAGNOSIS — I2510 Atherosclerotic heart disease of native coronary artery without angina pectoris: Secondary | ICD-10-CM | POA: Diagnosis not present

## 2013-06-06 ENCOUNTER — Encounter (HOSPITAL_COMMUNITY): Payer: Self-pay | Admitting: Emergency Medicine

## 2013-06-06 ENCOUNTER — Emergency Department (HOSPITAL_COMMUNITY)
Admission: EM | Admit: 2013-06-06 | Discharge: 2013-06-06 | Discharge status: Against Medical Advice | Payer: Medicare Other | Attending: Emergency Medicine | Admitting: Emergency Medicine

## 2013-06-06 DIAGNOSIS — Z87891 Personal history of nicotine dependence: Secondary | ICD-10-CM | POA: Insufficient documentation

## 2013-06-06 DIAGNOSIS — E119 Type 2 diabetes mellitus without complications: Secondary | ICD-10-CM | POA: Insufficient documentation

## 2013-06-06 DIAGNOSIS — R110 Nausea: Secondary | ICD-10-CM | POA: Diagnosis not present

## 2013-06-06 DIAGNOSIS — I209 Angina pectoris, unspecified: Secondary | ICD-10-CM | POA: Insufficient documentation

## 2013-06-06 DIAGNOSIS — Z8673 Personal history of transient ischemic attack (TIA), and cerebral infarction without residual deficits: Secondary | ICD-10-CM | POA: Insufficient documentation

## 2013-06-06 DIAGNOSIS — R109 Unspecified abdominal pain: Secondary | ICD-10-CM | POA: Diagnosis not present

## 2013-06-06 DIAGNOSIS — Z951 Presence of aortocoronary bypass graft: Secondary | ICD-10-CM | POA: Diagnosis not present

## 2013-06-06 DIAGNOSIS — I10 Essential (primary) hypertension: Secondary | ICD-10-CM | POA: Diagnosis not present

## 2013-06-06 NOTE — ED Notes (Signed)
Pt c/o abd pain and nausea for several weeks.  Reports feeling very tired recently.  Says was recently put on home 02.  C/O chills.  Reports has had diarrhea this week up until 2 days ago.  Says hasn't had bm in 2 days.  Denies vomiting.

## 2013-06-15 DIAGNOSIS — M069 Rheumatoid arthritis, unspecified: Secondary | ICD-10-CM | POA: Diagnosis not present

## 2013-06-15 DIAGNOSIS — M1990 Unspecified osteoarthritis, unspecified site: Secondary | ICD-10-CM | POA: Diagnosis not present

## 2013-06-15 DIAGNOSIS — R109 Unspecified abdominal pain: Secondary | ICD-10-CM | POA: Diagnosis not present

## 2013-06-20 ENCOUNTER — Observation Stay (HOSPITAL_COMMUNITY)
Admission: EM | Admit: 2013-06-20 | Discharge: 2013-06-23 | Disposition: A | Discharge status: Home / Self Care | Payer: Medicare Other | Attending: Internal Medicine | Admitting: Internal Medicine

## 2013-06-20 ENCOUNTER — Emergency Department (HOSPITAL_COMMUNITY): Payer: Medicare Other

## 2013-06-20 ENCOUNTER — Encounter (HOSPITAL_COMMUNITY): Payer: Self-pay | Admitting: Emergency Medicine

## 2013-06-20 DIAGNOSIS — Z951 Presence of aortocoronary bypass graft: Secondary | ICD-10-CM | POA: Diagnosis not present

## 2013-06-20 DIAGNOSIS — R82998 Other abnormal findings in urine: Secondary | ICD-10-CM | POA: Diagnosis not present

## 2013-06-20 DIAGNOSIS — I10 Essential (primary) hypertension: Secondary | ICD-10-CM | POA: Diagnosis present

## 2013-06-20 DIAGNOSIS — E119 Type 2 diabetes mellitus without complications: Secondary | ICD-10-CM | POA: Diagnosis present

## 2013-06-20 DIAGNOSIS — M1990 Unspecified osteoarthritis, unspecified site: Secondary | ICD-10-CM | POA: Diagnosis not present

## 2013-06-20 DIAGNOSIS — IMO0001 Reserved for inherently not codable concepts without codable children: Secondary | ICD-10-CM | POA: Insufficient documentation

## 2013-06-20 DIAGNOSIS — K219 Gastro-esophageal reflux disease without esophagitis: Secondary | ICD-10-CM | POA: Insufficient documentation

## 2013-06-20 DIAGNOSIS — I739 Peripheral vascular disease, unspecified: Secondary | ICD-10-CM | POA: Insufficient documentation

## 2013-06-20 DIAGNOSIS — G4733 Obstructive sleep apnea (adult) (pediatric): Secondary | ICD-10-CM | POA: Insufficient documentation

## 2013-06-20 DIAGNOSIS — G459 Transient cerebral ischemic attack, unspecified: Secondary | ICD-10-CM | POA: Diagnosis not present

## 2013-06-20 DIAGNOSIS — R209 Unspecified disturbances of skin sensation: Secondary | ICD-10-CM | POA: Diagnosis not present

## 2013-06-20 DIAGNOSIS — Z7982 Long term (current) use of aspirin: Secondary | ICD-10-CM | POA: Diagnosis not present

## 2013-06-20 DIAGNOSIS — I2510 Atherosclerotic heart disease of native coronary artery without angina pectoris: Secondary | ICD-10-CM | POA: Insufficient documentation

## 2013-06-20 DIAGNOSIS — I129 Hypertensive chronic kidney disease with stage 1 through stage 4 chronic kidney disease, or unspecified chronic kidney disease: Secondary | ICD-10-CM | POA: Insufficient documentation

## 2013-06-20 DIAGNOSIS — I509 Heart failure, unspecified: Secondary | ICD-10-CM | POA: Diagnosis not present

## 2013-06-20 DIAGNOSIS — R29898 Other symptoms and signs involving the musculoskeletal system: Secondary | ICD-10-CM | POA: Diagnosis present

## 2013-06-20 DIAGNOSIS — M069 Rheumatoid arthritis, unspecified: Secondary | ICD-10-CM | POA: Insufficient documentation

## 2013-06-20 DIAGNOSIS — E7800 Pure hypercholesterolemia, unspecified: Secondary | ICD-10-CM | POA: Diagnosis not present

## 2013-06-20 DIAGNOSIS — G8929 Other chronic pain: Secondary | ICD-10-CM | POA: Diagnosis present

## 2013-06-20 DIAGNOSIS — H4310 Vitreous hemorrhage, unspecified eye: Secondary | ICD-10-CM

## 2013-06-20 DIAGNOSIS — Z87891 Personal history of nicotine dependence: Secondary | ICD-10-CM | POA: Insufficient documentation

## 2013-06-20 DIAGNOSIS — N189 Chronic kidney disease, unspecified: Secondary | ICD-10-CM | POA: Insufficient documentation

## 2013-06-20 DIAGNOSIS — Z8673 Personal history of transient ischemic attack (TIA), and cerebral infarction without residual deficits: Secondary | ICD-10-CM | POA: Insufficient documentation

## 2013-06-20 LAB — DIFFERENTIAL
BASOS ABS: 0 10*3/uL (ref 0.0–0.1)
BASOS PCT: 1 % (ref 0–1)
EOS PCT: 2 % (ref 0–5)
Eosinophils Absolute: 0.2 10*3/uL (ref 0.0–0.7)
LYMPHS PCT: 17 % (ref 12–46)
Lymphs Abs: 1.5 10*3/uL (ref 0.7–4.0)
MONO ABS: 1.2 10*3/uL — AB (ref 0.1–1.0)
Monocytes Relative: 13 % — ABNORMAL HIGH (ref 3–12)
NEUTROS ABS: 5.9 10*3/uL (ref 1.7–7.7)
Neutrophils Relative %: 67 % (ref 43–77)

## 2013-06-20 LAB — URINALYSIS, ROUTINE W REFLEX MICROSCOPIC
BILIRUBIN URINE: NEGATIVE
Glucose, UA: NEGATIVE mg/dL
Hgb urine dipstick: NEGATIVE
KETONES UR: NEGATIVE mg/dL
NITRITE: NEGATIVE
PROTEIN: NEGATIVE mg/dL
Specific Gravity, Urine: 1.02 (ref 1.005–1.030)
UROBILINOGEN UA: 0.2 mg/dL (ref 0.0–1.0)
pH: 5.5 (ref 5.0–8.0)

## 2013-06-20 LAB — COMPREHENSIVE METABOLIC PANEL
ALBUMIN: 4 g/dL (ref 3.5–5.2)
ALT: 18 U/L (ref 0–35)
AST: 23 U/L (ref 0–37)
Alkaline Phosphatase: 51 U/L (ref 39–117)
BUN: 29 mg/dL — ABNORMAL HIGH (ref 6–23)
CALCIUM: 9.4 mg/dL (ref 8.4–10.5)
CO2: 22 meq/L (ref 19–32)
CREATININE: 1.36 mg/dL — AB (ref 0.50–1.10)
Chloride: 100 mEq/L (ref 96–112)
GFR calc Af Amer: 45 mL/min — ABNORMAL LOW (ref >90)
GFR calc non Af Amer: 39 mL/min — ABNORMAL LOW (ref >90)
Glucose, Bld: 127 mg/dL — ABNORMAL HIGH (ref 70–99)
Potassium: 4.4 mEq/L (ref 3.7–5.3)
SODIUM: 138 meq/L (ref 137–147)
Total Bilirubin: 0.2 mg/dL — ABNORMAL LOW (ref 0.3–1.2)
Total Protein: 7.3 g/dL (ref 6.0–8.3)

## 2013-06-20 LAB — CBC
HEMATOCRIT: 36.1 % (ref 36.0–46.0)
Hemoglobin: 12.1 g/dL (ref 12.0–15.0)
MCH: 32.5 pg (ref 26.0–34.0)
MCHC: 33.5 g/dL (ref 30.0–36.0)
MCV: 97 fL (ref 78.0–100.0)
PLATELETS: 318 10*3/uL (ref 150–400)
RBC: 3.72 MIL/uL — ABNORMAL LOW (ref 3.87–5.11)
RDW: 13.8 % (ref 11.5–15.5)
WBC: 8.7 10*3/uL (ref 4.0–10.5)

## 2013-06-20 LAB — RAPID URINE DRUG SCREEN, HOSP PERFORMED
AMPHETAMINES: NOT DETECTED
BARBITURATES: NOT DETECTED
BENZODIAZEPINES: NOT DETECTED
COCAINE: NOT DETECTED
Opiates: NOT DETECTED
TETRAHYDROCANNABINOL: NOT DETECTED

## 2013-06-20 LAB — URINE MICROSCOPIC-ADD ON

## 2013-06-20 LAB — PROTIME-INR
INR: 0.91 (ref 0.00–1.49)
PROTHROMBIN TIME: 12.1 s (ref 11.6–15.2)

## 2013-06-20 LAB — TROPONIN I

## 2013-06-20 LAB — GLUCOSE, CAPILLARY: GLUCOSE-CAPILLARY: 125 mg/dL — AB (ref 70–99)

## 2013-06-20 LAB — ETHANOL

## 2013-06-20 LAB — APTT: APTT: 28 s (ref 24–37)

## 2013-06-20 MED ORDER — TOFACITINIB CITRATE 5 MG PO TABS
5.0000 mg | ORAL_TABLET | Freq: Two times a day (BID) | ORAL | Status: DC
  Administered 2013-06-21: 5 mg via ORAL
  Filled 2013-06-20: qty 1

## 2013-06-20 MED ORDER — ADULT MULTIVITAMIN W/MINERALS CH
1.0000 | ORAL_TABLET | Freq: Every day | ORAL | Status: DC
  Administered 2013-06-21 – 2013-06-23 (×3): 1 via ORAL
  Filled 2013-06-20 (×3): qty 1

## 2013-06-20 MED ORDER — IRBESARTAN 300 MG PO TABS
300.0000 mg | ORAL_TABLET | Freq: Every day | ORAL | Status: DC
Start: 2013-06-21 — End: 2013-06-21
  Filled 2013-06-20: qty 1

## 2013-06-20 MED ORDER — GATIFLOXACIN 0.5 % OP SOLN
1.0000 [drp] | Freq: Four times a day (QID) | OPHTHALMIC | Status: DC
  Filled 2013-06-20: qty 2.5

## 2013-06-20 MED ORDER — LATANOPROST 0.005 % OP SOLN
1.0000 [drp] | Freq: Every day | OPHTHALMIC | Status: DC
  Administered 2013-06-20 – 2013-06-22 (×2): 1 [drp] via OPHTHALMIC
  Filled 2013-06-20 (×3): qty 2.5

## 2013-06-20 MED ORDER — ACETAMINOPHEN 325 MG PO TABS
650.0000 mg | ORAL_TABLET | ORAL | Status: DC | PRN
  Administered 2013-06-22: 650 mg via ORAL
  Filled 2013-06-20: qty 2

## 2013-06-20 MED ORDER — SPIRONOLACTONE 25 MG PO TABS
25.0000 mg | ORAL_TABLET | Freq: Every day | ORAL | Status: DC
  Administered 2013-06-21 – 2013-06-23 (×2): 25 mg via ORAL
  Filled 2013-06-20 (×3): qty 1

## 2013-06-20 MED ORDER — ENOXAPARIN SODIUM 40 MG/0.4ML ~~LOC~~ SOLN
40.0000 mg | SUBCUTANEOUS | Status: DC
  Administered 2013-06-21 – 2013-06-23 (×3): 40 mg via SUBCUTANEOUS
  Filled 2013-06-20 (×3): qty 0.4

## 2013-06-20 MED ORDER — OLMESARTAN-AMLODIPINE-HCTZ 40-5-25 MG PO TABS: 1.0000 | ORAL_TABLET | Freq: Every day | ORAL | Status: DC

## 2013-06-20 MED ORDER — ASPIRIN EC 81 MG PO TBEC
81.0000 mg | DELAYED_RELEASE_TABLET | Freq: Every day | ORAL | Status: DC
  Filled 2013-06-20: qty 1

## 2013-06-20 MED ORDER — CONJ ESTROG-MEDROXYPROGEST ACE 0.625-5 MG PO TABS: 1.0000 | ORAL_TABLET | Freq: Every day | ORAL | Status: DC

## 2013-06-20 MED ORDER — TEMAZEPAM 7.5 MG PO CAPS
30.0000 mg | ORAL_CAPSULE | Freq: Every day | ORAL | Status: DC
  Administered 2013-06-21 – 2013-06-22 (×2): 30 mg via ORAL
  Filled 2013-06-20 (×2): qty 4

## 2013-06-20 MED ORDER — PREDNISOLONE ACETATE 1 % OP SUSP
1.0000 [drp] | Freq: Four times a day (QID) | OPHTHALMIC | Status: DC
  Filled 2013-06-20: qty 1

## 2013-06-20 MED ORDER — ESTROGENS CONJUGATED 0.625 MG PO TABS
0.6250 mg | ORAL_TABLET | Freq: Every day | ORAL | Status: DC
  Administered 2013-06-21 – 2013-06-23 (×3): 0.625 mg via ORAL
  Filled 2013-06-20 (×3): qty 1

## 2013-06-20 MED ORDER — METHOTREXATE 2.5 MG PO TABS
12.5000 mg | ORAL_TABLET | ORAL | Status: DC
Start: 2013-06-20 — End: 2013-06-20

## 2013-06-20 MED ORDER — HYDROCHLOROTHIAZIDE 25 MG PO TABS
25.0000 mg | ORAL_TABLET | Freq: Every day | ORAL | Status: DC
Start: 2013-06-21 — End: 2013-06-23
  Administered 2013-06-22 – 2013-06-23 (×2): 25 mg via ORAL
  Filled 2013-06-20 (×3): qty 1

## 2013-06-20 MED ORDER — BACITRACIN-POLYMYXIN B 500-10000 UNIT/GM OP OINT
1.0000 "application " | TOPICAL_OINTMENT | Freq: Four times a day (QID) | OPHTHALMIC | Status: DC
  Filled 2013-06-20: qty 3.5

## 2013-06-20 MED ORDER — PRENATAL 27-0.8 MG PO TABS: 1.0000 | ORAL_TABLET | Freq: Every day | ORAL | Status: DC

## 2013-06-20 MED ORDER — PANTOPRAZOLE SODIUM 40 MG PO TBEC
40.0000 mg | DELAYED_RELEASE_TABLET | Freq: Every day | ORAL | Status: DC
  Administered 2013-06-21 – 2013-06-23 (×3): 40 mg via ORAL
  Filled 2013-06-20 (×2): qty 1

## 2013-06-20 MED ORDER — BRIMONIDINE TARTRATE 0.2 % OP SOLN
1.0000 [drp] | Freq: Two times a day (BID) | OPHTHALMIC | Status: DC
  Filled 2013-06-20: qty 5

## 2013-06-20 MED ORDER — METOPROLOL SUCCINATE ER 25 MG PO TB24
25.0000 mg | ORAL_TABLET | Freq: Every day | ORAL | Status: DC
  Administered 2013-06-22 – 2013-06-23 (×2): 25 mg via ORAL
  Filled 2013-06-20 (×3): qty 1

## 2013-06-20 MED ORDER — MEDROXYPROGESTERONE ACETATE 5 MG PO TABS
5.0000 mg | ORAL_TABLET | Freq: Every day | ORAL | Status: DC
  Administered 2013-06-21 – 2013-06-23 (×3): 5 mg via ORAL
  Filled 2013-06-20 (×3): qty 1

## 2013-06-20 MED ORDER — DULOXETINE HCL 60 MG PO CPEP
60.0000 mg | ORAL_CAPSULE | Freq: Every day | ORAL | Status: DC
  Administered 2013-06-21 – 2013-06-23 (×2): 60 mg via ORAL
  Filled 2013-06-20 (×3): qty 1

## 2013-06-20 NOTE — ED Notes (Signed)
Episode around 1400 today where she could not move her left arm.  States she could not use it to pick up the telephone. States her arm was just "flopping around"  Lasted around 5 minutes.

## 2013-06-20 NOTE — ED Provider Notes (Signed)
CSN: [id number]     Arrival date & time 06/20/13  1624 History   First MD Initiated Contact with Patient 06/20/13 1726     Chief Complaint  Patient presents with  . Numbness   (Consider location/radiation/quality/duration/timing/severity/associated sxs/prior Treatment) HPI 68 year old prior TIA has diabetes hypertension obesity sleep apnea last known well 4:00 this afternoon at 5 minutes spell left arm weakness numbness incoordination it resolves completely she feels back to baseline since then no pain in the arm no neck pain no headache no confusion no change in speech vision swallowing or understanding no weakness or numbness or incoordination to her right arm or legs she was able to walk and talk during her episode. There is no treatment prior to arrival other than 4 baby aspirin that she swallowed without difficulty at home. Her last TIA symptoms were about 5 years ago. Past Medical History  Diagnosis Date  . Hypertension   . Dysrhythmia   . Sleep apnea     sleep study  oct 2012  . Anginal pain     sees Dr Einar Gip.   . Diabetes mellitus without complication     no meds -diet controlled  . Stroke 2011    [place]    . Peripheral vascular disease     legs  . GERD (gastroesophageal reflux disease)   . Arthritis     rheumatoid ..   . Fibromyalgia   . Liver disease 08- 2012    per Dr Dagmar Hait pt has enlarged liver   Past Surgical History  Procedure Laterality Date  . Coronary artery bypass graft  1986  . Cardiac catheterization  1996  1986  . Eye surgery  2013    cat ext bilateral .  . Eye surgery  2002    laser  . Fracture surgery  -1988- 2007    rod right leg - right wrist plate  . Cholecystectomy  1996  . Pars plana vitrectomy  03/25/2012    Procedure: PARS PLANA VITRECTOMY WITH 25 GAUGE;  Surgeon: Hayden Pedro, MD;  Location: [facility];  Service: Ophthalmology;  Laterality: Right;  . Gas/fluid exchange  03/25/2012    Procedure: GAS/FLUID EXCHANGE;  Surgeon: Hayden Pedro, MD;  Location: [facility];  Service: Ophthalmology;  Laterality: Right;   Family History  Problem Relation Age of Onset  . Hypertension Daughter    History  Substance Use Topics  . Smoking status: Former Research scientist (life sciences)  . Smokeless tobacco: Not on file  . Alcohol Use: 0.0 oz/week     Comment: occ   OB History   Grav Para Term Preterm Abortions TAB SAB Ect Mult Living                 Review of Systems 10 Systems reviewed and are negative for acute change except as noted in the HPI. Allergies  Fentanyl; Lactase; Butrans; and Simvastatin  Home Medications   No current outpatient prescriptions on file. BP 136/65  Pulse 85  Temp(Src) 97.9 F (36.6 C) (Oral)  Resp 18  Ht 5\' 5"  (1.651 m)  Wt 268 lb 14.4 oz (121.972 kg)  BMI 44.75 kg/m2  SpO2 95% Physical Exam  Nursing note and vitals reviewed. Constitutional:  Awake, alert, nontoxic appearance with baseline speech for patient.  HENT:  Head: Atraumatic.  Mouth/Throat: No oropharyngeal exudate.  Eyes: EOM are normal. Pupils are equal, round, and reactive to light. Right eye exhibits no discharge. Left eye exhibits no discharge.  Neck: Neck supple.  Cardiovascular:  Normal rate and regular rhythm.   No murmur heard. Pulmonary/Chest: Effort normal and breath sounds normal. No stridor. No respiratory distress. She has no wheezes. She has no rales. She exhibits no tenderness.  Abdominal: Soft. Bowel sounds are normal. She exhibits no mass. There is no tenderness. There is no rebound.  Musculoskeletal: She exhibits no tenderness.  Baseline ROM, moves extremities with no obvious new focal weakness.  Lymphadenopathy:    She has no cervical adenopathy.  Neurological: She is alert.  Awake, alert, cooperative and aware of situation; motor strength 5/5 bilaterally; sensation normal to light touch bilaterally; peripheral visual fields full to confrontation; no facial asymmetry; tongue midline; major cranial nerves appear intact; no  pronator drift, normal finger to nose bilaterally, Pt and husband report Pt has baseline gait without new ataxia upon arrival  Skin: No rash noted.  Psychiatric: She has a normal mood and affect.    ED Course  Procedures (including critical care time) Seen by [facility] in ED who arranged transfer to [facility] since no MRI at [facility] this weekend.1950 Patient / Family / Caregiver informed of clinical course, understand medical decision-making process, and agree with plan. Labs Review Labs Reviewed  CBC - Abnormal; Notable for the following:    RBC 3.72 (*)    All other components within normal limits  DIFFERENTIAL - Abnormal; Notable for the following:    Monocytes Relative 13 (*)    Monocytes Absolute 1.2 (*)    All other components within normal limits  COMPREHENSIVE METABOLIC PANEL - Abnormal; Notable for the following:    Glucose, Bld 127 (*)    BUN 29 (*)    Creatinine, Ser 1.36 (*)    Total Bilirubin 0.2 (*)    GFR calc non Af Amer 39 (*)    GFR calc Af Amer 45 (*)    All other components within normal limits  URINALYSIS, ROUTINE W REFLEX MICROSCOPIC - Abnormal; Notable for the following:    Leukocytes, UA TRACE (*)    All other components within normal limits  GLUCOSE, CAPILLARY - Abnormal; Notable for the following:    Glucose-Capillary 125 (*)    All other components within normal limits  URINE MICROSCOPIC-ADD ON - Abnormal; Notable for the following:    Squamous Epithelial / LPF FEW (*)    All other components within normal limits  LIPID PANEL - Abnormal; Notable for the following:    Triglycerides 175 (*)    All other components within normal limits  GLUCOSE, CAPILLARY - Abnormal; Notable for the following:    Glucose-Capillary 106 (*)    All other components within normal limits  GLUCOSE, CAPILLARY - Abnormal; Notable for the following:    Glucose-Capillary 117 (*)    All other components within normal limits  ETHANOL  PROTIME-INR  APTT  URINE RAPID DRUG SCREEN (HOSP  PERFORMED)  TROPONIN I  HEMOGLOBIN A1C   Imaging Review Ct Head Wo Contrast  06/20/2013   CLINICAL DATA:  Transient ischemic attack, numbness.  EXAM: CT HEAD WITHOUT CONTRAST  TECHNIQUE: Contiguous axial images were obtained from the base of the skull through the vertex without intravenous contrast.  COMPARISON:  CT scan of October 06, 2007.  FINDINGS: Bony calvarium appears intact. Mild diffuse cortical atrophy is noted. No mass effect or midline shift is noted. Ventricular size is within normal limits. There is no evidence of mass lesion, hemorrhage or acute infarction.  IMPRESSION: Mild diffuse cortical atrophy. No acute intracranial abnormality seen.   Electronically Signed  By: Sabino Dick M.D.   On: 06/20/2013 18:29    EKG Interpretation    Date/Time:  Saturday June 20 2013 18:26:41 EST Ventricular Rate:  79 PR Interval:  168 QRS Duration: 88 QT Interval:  388 QTC Calculation: 444 R Axis:   65 Text Interpretation:  Normal sinus rhythm Low voltage QRS Nonspecific T wave abnormality No previous ECGs available Confirmed by [facility]  MD, Joneric Streight ([id number]) on 06/20/2013 6:40:20 PM            MDM   1. TIA (transient ischemic attack)   2. Chronic pain   3. DM (diabetes mellitus)   4. HTN (hypertension)   5. Left arm weakness    The patient appears reasonably stabilized for admission considering the current resources, flow, and capabilities available in the ED at this time, and I doubt any other [facility] requiring further screening and/or treatment in the ED prior to admission.    Babette Relic, MD 06/21/13 1323

## 2013-06-20 NOTE — ED Notes (Signed)
Report called to Merleen Nicely, RN on unit 4N at [facility].

## 2013-06-20 NOTE — Progress Notes (Signed)
Received report from [place] at [place] at 2118. Room ready for pt arrival.

## 2013-06-20 NOTE — ED Notes (Signed)
Neuro assessment normal.  Pt states she is ok right now.

## 2013-06-20 NOTE — Progress Notes (Signed)
[facility] progress note. Chief complaint. Transfer note. History of present illness. This 68 year old female presented to [facility] emergency room with complaints of left arm weakness and tingling. Symptoms thought likely secondary to TIA no CVA and C-spine degenerative joint disease also considerations. No acute findings per CT of the head. Patient felt to require transfer to [facility] for TIA workup. I'm seeing the patient at bedside to ensure she remains stable post transfer and all orders transferred here appropriately. Vital signs. Temperature 98.1, pulse 87, respirations 17, blood pressure 106/72. O2 sats 94%. General appearance. Morbidly obese elderly female who is alert, cooperative, and in no distress. Cardiac. Rate and rhythm regular. Lungs. Breath sounds clear and equal. Abdomen. Soft obese with positive bowel sounds. No pain. Neurologic. Cranial nerves 2-12 grossly intact. No unilateral or focal defects noted. Impression/plan. Problem #1. Left arm weakness. Etiology is yet unclear TIA versus CVA versus C-spine degenerative joint disease. Patient for TIA workup and neurology consult if MRI is positive. Patient appears clinically stable post transfer. All orders appear to transferred appropriately.

## 2013-06-20 NOTE — H&P (Signed)
[facility] History and Physical  [name] NFA:[id number] DOB: [birth year] DOA: 06/20/2013  Referring physician:  PCP: Tivis Ringer, MD  Specialists:   Chief Complaint: L arm weakness, tingling   HPI: [name] is a 68 y.o. female with PMH of DM,. HTN, HPL, CKD, DJD, OSA, chronic pain, h/o congenital heart disease, h/o TIA presented with [facility] ED with L arm weakness, tingling; she reports symptoms onset at 4:00 this afternoon lasted 5 minutes mostly weakness, and numbness of left arm which resolved completely; patient denies any other focal neurological symptoms, no vertigo, gait ataxia, no other constitutional symptom s, no fever, chills, no chest pain, no SOB; but reports chronic fatigue, tiredness and DOE; she had similar episode 5 years ago;  -due to unavailability of MRI at [facility], patient is being TF to [facility] to complete TIA work up    Review of Systems: The patient denies anorexia, fever, weight loss,, vision loss, decreased hearing, hoarseness, chest pain, syncope, dyspnea on exertion, peripheral edema, balance deficits, hemoptysis, abdominal pain, melena, hematochezia, severe indigestion/heartburn, hematuria, incontinence, genital sores, muscle weakness, suspicious skin lesions, transient blindness, difficulty walking, depression, unusual weight change, abnormal bleeding, enlarged lymph nodes, angioedema, and breast masses.   Past Medical History  Diagnosis Date  . Hypertension   . Dysrhythmia   . Sleep apnea     sleep study  oct 2012  . Anginal pain     sees Dr Einar Gip.   . Diabetes mellitus without complication     no meds -diet controlled  . Stroke 2011    [place]    . Peripheral vascular disease     legs  . GERD (gastroesophageal reflux disease)   . Arthritis     rheumatoid ..   . Fibromyalgia   . Liver disease 08- 2012    per Dr Dagmar Hait pt has enlarged liver   Past Surgical History  Procedure Laterality Date  . Coronary artery bypass graft  1986   . Cardiac catheterization  1996  1986  . Eye surgery  2013    cat ext bilateral .  . Eye surgery  2002    laser  . Fracture surgery  -1988- 2007    rod right leg - right wrist plate  . Cholecystectomy  1996  . Pars plana vitrectomy  03/25/2012    Procedure: PARS PLANA VITRECTOMY WITH 25 GAUGE;  Surgeon: Hayden Pedro, MD;  Location: [facility];  Service: Ophthalmology;  Laterality: Right;  . Gas/fluid exchange  03/25/2012    Procedure: GAS/FLUID EXCHANGE;  Surgeon: Hayden Pedro, MD;  Location: [place];  Service: Ophthalmology;  Laterality: Right;   Social History:  reports that she has quit smoking. She does not have any smokeless tobacco history on file. She reports that she drinks alcohol. She reports that she does not use illicit drugs. Home;  where does patient live--home, ALF, SNF? and with whom if at home? Yes;  Can patient participate in ADLs?  Allergies  Allergen Reactions  . Fentanyl   . Other     butrans patch  . Simvastatin Rash    Family History  Problem Relation Age of Onset  . Hypertension Daughter     (be sure to complete)  Prior to Admission medications   Medication Sig Start Date End Date Taking? Authorizing Provider  aspirin EC 81 MG tablet Take 81 mg by mouth daily.    Historical Provider, MD  bacitracin-polymyxin b (POLYSPORIN) ophthalmic ointment Place 1 application into the  right eye 4 (four) times daily. apply to eye every 12 hours while awake 03/26/12   Hayden Pedro, MD  brimonidine [facility]) 0.2 % ophthalmic solution Place 1 drop into the right eye 2 (two) times daily. 03/26/12   Hayden Pedro, MD  DULoxetine (CYMBALTA) 60 MG capsule Take 60 mg by mouth daily.    Historical Provider, MD  esomeprazole (NEXIUM) 40 MG capsule Take 40 mg by mouth daily before breakfast.    Historical Provider, MD  estrogen, conjugated,-medroxyprogesterone (PREMPRO) 0.625-5 MG per tablet Take 1 tablet by mouth daily.    Historical Provider, MD  gatifloxacin (ZYMAXID)  0.5 % SOLN Place 1 drop into the right eye 4 (four) times daily. 03/26/12   Hayden Pedro, MD  HYDROcodone-acetaminophen (LORTAB) 7.5-500 MG per tablet Take 1 tablet by mouth 4 (four) times daily as needed. For pain    Historical Provider, MD  latanoprost (XALATAN) 0.005 % ophthalmic solution Place 1 drop into the right eye at bedtime. 03/26/12   Hayden Pedro, MD  methotrexate (RHEUMATREX) 2.5 MG tablet Take 12.5 mg by mouth once a week. Thursday; Caution:Chemotherapy. Protect from light.    Historical Provider, MD  metoprolol succinate (TOPROL-XL) 25 MG 24 hr tablet Take 25 mg by mouth daily.    Historical Provider, MD  Olmesartan-Amlodipine-HCTZ (TRIBENZOR) 40-5-25 MG TABS Take 1 tablet by mouth daily.    Historical Provider, MD  prednisoLONE acetate (PRED FORTE) 1 % ophthalmic suspension Place 1 drop into the right eye 4 (four) times daily. 03/26/12   Hayden Pedro, MD  Prenatal Vit-Fe Fumarate-FA (MULTIVITAMIN-PRENATAL) 27-0.8 MG TABS Take 1 tablet by mouth daily.    Historical Provider, MD  spironolactone (ALDACTONE) 25 MG tablet Take 25 mg by mouth daily.    Historical Provider, MD  Tafluprost (ZIOPTAN) 0.0015 % SOLN Place 1 drop into both eyes at bedtime.    Historical Provider, MD  temazepam (RESTORIL) 30 MG capsule Take 30 mg by mouth at bedtime.    Historical Provider, MD  Tofacitinib Citrate (XELJANZ) 5 MG TABS Take 5 mg by mouth 2 (two) times daily.    Historical Provider, MD   Physical Exam: Filed Vitals:   06/20/13 1844  BP: 124/52  Pulse: 81  Temp:   Resp: 18     General:  alert  Eyes: EOM-I, perrla   ENT: no oral ulcers   Neck: supple, no JVD  Cardiovascular: s1,s2 rrr  Respiratory: CTA BL  Abdomen: soft, obese, NT  Skin: no rash   Musculoskeletal: non pitting edema LE  Psychiatric: no hallucinations   Neurologic: CN 2-12 intact, motor 5/5 BL  Labs on Admission:  Basic Metabolic Panel:  Recent Labs Lab 06/20/13 1833  NA 138  K 4.4  CL 100   CO2 22  GLUCOSE 127*  BUN 29*  CREATININE 1.36*  CALCIUM 9.4   Liver Function Tests:  Recent Labs Lab 06/20/13 1833  AST 23  ALT 18  ALKPHOS 51  BILITOT 0.2*  PROT 7.3  ALBUMIN 4.0   No results found for this basename: LIPASE, AMYLASE,  in the last 168 hours No results found for this basename: AMMONIA,  in the last 168 hours CBC:  Recent Labs Lab 06/20/13 1833  WBC 8.7  NEUTROABS 5.9  HGB 12.1  HCT 36.1  MCV 97.0  PLT 318   Cardiac Enzymes:  Recent Labs Lab 06/20/13 1833  TROPONINI <0.30    BNP (last 3 results) No results found for this basename: PROBNP,  in the last 8760 hours CBG:  Recent Labs Lab 06/20/13 1841  GLUCAP 125*    Radiological Exams on Admission: Ct Head Wo Contrast  06/20/2013   CLINICAL DATA:  Transient ischemic attack, numbness.  EXAM: CT HEAD WITHOUT CONTRAST  TECHNIQUE: Contiguous axial images were obtained from the base of the skull through the vertex without intravenous contrast.  COMPARISON:  CT scan of October 06, 2007.  FINDINGS: Bony calvarium appears intact. Mild diffuse cortical atrophy is noted. No mass effect or midline shift is noted. Ventricular size is within normal limits. There is no evidence of mass lesion, hemorrhage or acute infarction.  IMPRESSION: Mild diffuse cortical atrophy. No acute intracranial abnormality seen.   Electronically Signed   By: Sabino Dick M.D.   On: 06/20/2013 18:29    EKG: Independently reviewed. NSR, low voltage, no specific t wave changes   Assessment/Plan Active Problems:   TIA (transient ischemic attack)   Left arm weakness   DM (diabetes mellitus)   HTN (hypertension)   Chronic pain  68 y.o. female with PMH of DM,. HTN, HPL, DJD, CKD, OSA, chronic pain, h/o congenital heart disease, h/o TIA presented with [facility] ED with L arm weakness, tingling  1. L arm weakness, paraesthesia likely TIA; r/o CVA vs probable C spine DJD, but + risk factors for CVA -symptoms resolved, CT head: no acute  findings; neuro exam non focal;  -cont ASA; control risk factors, to complete TIA work up; TF to [facility]; if MRI positive may need neurology eval;   2. HTN stable; cont home meds; titrate per response  3. DM no recent HA1C; patient does not take meds at home;  -ISS if need while inpatient; check A1C;   4. DJD, chronic pain; off opioids; methotrexate, cont prn tylenol;   5. Asymptomatic bacteruria; cont monitoring   None;  if consultant consulted, please document name and whether formally or informally consulted  Code Status: full (must indicate code status--if unknown or must be presumed, indicate so) Family Communication: d/w patient, ED physician at [facility]  (indicate person spoken with, if applicable, with phone number if by telephone) Disposition Plan: TF to [facility] (indicate anticipated LOS)  Time spent: >35 minutes   [place], [facility] Pager [phone]  If 7PM-7AM, please contact night-coverage [email or web address] Password [facility] 06/20/2013, 7:53 PM

## 2013-06-20 NOTE — ED Notes (Signed)
Report called to Lajoyce Corners w/[facility].

## 2013-06-20 NOTE — ED Notes (Signed)
Dr. Stevie Kern at bedside,

## 2013-06-21 DIAGNOSIS — I10 Essential (primary) hypertension: Secondary | ICD-10-CM | POA: Diagnosis not present

## 2013-06-21 DIAGNOSIS — G459 Transient cerebral ischemic attack, unspecified: Secondary | ICD-10-CM

## 2013-06-21 DIAGNOSIS — R29898 Other symptoms and signs involving the musculoskeletal system: Secondary | ICD-10-CM | POA: Diagnosis not present

## 2013-06-21 DIAGNOSIS — E119 Type 2 diabetes mellitus without complications: Secondary | ICD-10-CM | POA: Diagnosis not present

## 2013-06-21 LAB — GLUCOSE, CAPILLARY
GLUCOSE-CAPILLARY: 106 mg/dL — AB (ref 70–99)
GLUCOSE-CAPILLARY: 117 mg/dL — AB (ref 70–99)
GLUCOSE-CAPILLARY: 162 mg/dL — AB (ref 70–99)
GLUCOSE-CAPILLARY: 125 mg/dL — AB (ref 70–99)

## 2013-06-21 LAB — HEMOGLOBIN A1C
Hgb A1c MFr Bld: 6.7 % — ABNORMAL HIGH (ref <5.7)
MEAN PLASMA GLUCOSE: 146 mg/dL — AB (ref <117)

## 2013-06-21 LAB — LIPID PANEL
Cholesterol: 145 mg/dL (ref 0–200)
HDL: 45 mg/dL (ref >39)
LDL Cholesterol: 65 mg/dL (ref 0–99)
Total CHOL/HDL Ratio: 3.2 RATIO
Triglycerides: 175 mg/dL — ABNORMAL HIGH (ref <150)
VLDL: 35 mg/dL (ref 0–40)

## 2013-06-21 MED ORDER — LORAZEPAM 2 MG/ML IJ SOLN
0.5000 mg | Freq: Once | INTRAMUSCULAR | Status: AC
  Administered 2013-06-22: 0.5 mg via INTRAVENOUS
  Filled 2013-06-21: qty 1

## 2013-06-21 MED ORDER — CLOPIDOGREL BISULFATE 75 MG PO TABS
75.0000 mg | ORAL_TABLET | Freq: Every day | ORAL | Status: DC
  Administered 2013-06-21 – 2013-06-23 (×3): 75 mg via ORAL
  Filled 2013-06-21 (×3): qty 1

## 2013-06-21 MED ORDER — SODIUM CHLORIDE 0.9 % IV SOLN
INTRAVENOUS | Status: AC
  Administered 2013-06-21: 15:00:00 via INTRAVENOUS

## 2013-06-21 MED ORDER — ALUM & MAG HYDROXIDE-SIMETH 200-200-20 MG/5ML PO SUSP
30.0000 mL | ORAL | Status: DC | PRN
  Administered 2013-06-21: 30 mL via ORAL
  Filled 2013-06-21: qty 30

## 2013-06-21 MED ORDER — ONDANSETRON HCL 4 MG/2ML IJ SOLN
4.0000 mg | Freq: Four times a day (QID) | INTRAMUSCULAR | Status: DC | PRN
  Administered 2013-06-21: 4 mg via INTRAVENOUS
  Filled 2013-06-21: qty 2

## 2013-06-21 NOTE — Consult Note (Signed)
Referring Physician: Dr Candiss Norse    Chief Complaint: left upper extremity numbness and weakness  HPI: [name] is a 68 y.o. female who was seated in a chair yesterday at home around 4 PM when she noticed that her left arm felt tingly. She went to raise her left arm and found it to be weak and difficult to control. According to the patient it was flopping around. She tried to reach behind her and pickup the phone but she could not hold the phone in her hand. Her fingers seem to contract. The episode lasted about 3 minutes. She immediately took 4 baby aspirin and had her husband bring her to the emergency department and she was admitted.  She reports that 5 years ago she had a similar episode which was diagnosed as a TIA. This occurred in [facility]. She was seen at the hospital there and had an MRI which apparently was unrevealing. She has been taking aspirin 81 mg daily ever since. That episode also lasted approximately 3 minutes.  Today, while in the hospital, at approximately 10 or 11 AM the patient had a third such episode which was witnessed by her husband. The nurse was called; however, the episode had essentially resolved by the time the nurse arrived. The patient was changed to Plavix and a neurology consult was requested. An MRI has also been ordered.    Past Medical History  Diagnosis Date  . Hypertension   . Dysrhythmia   . Sleep apnea     sleep study  oct 2012  . Anginal pain     sees Dr Einar Gip.   . Diabetes mellitus without complication     no meds -diet controlled  . Stroke 2011    [place]     . Peripheral vascular disease     legs  . GERD (gastroesophageal reflux disease)   . Arthritis     rheumatoid ..   . Fibromyalgia   . Liver disease 08- 2012    per Dr Dagmar Hait pt has enlarged liver    Past Surgical History  Procedure Laterality Date  . Coronary artery bypass graft  1986  . Cardiac catheterization  1996  1986  . Eye surgery  2013    cat  ext bilateral .  . Eye surgery  2002    laser  . Fracture surgery  -1988- 2007    rod right leg - right wrist plate  . Cholecystectomy  1996  . Pars plana vitrectomy  03/25/2012    Procedure: PARS PLANA VITRECTOMY WITH 25 GAUGE;  Surgeon: Hayden Pedro, MD;  Location: [place];  Service: Ophthalmology;  Laterality: Right;  . Gas/fluid exchange  03/25/2012    Procedure: GAS/FLUID EXCHANGE;  Surgeon: Hayden Pedro, MD;  Location: [place];  Service: Ophthalmology;  Laterality: Right;    Family History  Problem Relation Age of Onset  . Hypertension Daughter   The patient's father died from an MI at age 64. Her mother died at age 83 from heart disease. There is no significant family history of strokes.  Social History:  reports that she has quit smoking. She does not have any smokeless tobacco history on file. She reports that she drinks alcohol. She reports that she does not use illicit drugs.  Allergies:  Allergies  Allergen Reactions  . Fentanyl Shortness Of Breath  . Lactase   . Butrans [Buprenorphine] Rash and Other (See Comments)    Infected skin underneath application  . Simvastatin Rash  Medications:  Scheduled: . clopidogrel  75 mg Oral Q breakfast  . DULoxetine  60 mg Oral Daily  . enoxaparin (LOVENOX) injection  40 mg Subcutaneous Q24H  . estrogens (conjugated)  0.625 mg Oral Daily  . hydrochlorothiazide  25 mg Oral Daily  . latanoprost  1 drop Right Eye QHS  . LORazepam  0.5 mg Intravenous Once  . medroxyPROGESTERone  5 mg Oral Daily  . metoprolol succinate  25 mg Oral Daily  . multivitamin with minerals  1 tablet Oral Daily  . pantoprazole  40 mg Oral Daily  . spironolactone  25 mg Oral Daily  . temazepam  30 mg Oral QHS  . Tofacitinib Citrate  5 mg Oral BID    ROS: History obtained from the patient  General ROS: negative for - fever, night sweats, weight gain or weight loss. Positive for chills and fatigue for the past year. Psychological ROS: negative for  - behavioral disorder, hallucinations, memory difficulties, mood swings or suicidal ideation Ophthalmic ROS: negative for - blurry vision, double vision, eye pain or loss of vision ENT ROS: negative for - epistaxis, nasal discharge, oral lesions, sore throat, tinnitus or vertigo Allergy and Immunology ROS: negative for - hives or itchy/watery eyes Hematological and Lymphatic ROS: negative for - bleeding problems, bruising or swollen lymph nodes Endocrine ROS: negative for - galactorrhea, hair pattern changes, polydipsia/polyuria or temperature intolerance Respiratory ROS: negative for - cough, hemoptysis, shortness of breath or wheezing Cardiovascular ROS: negative for - chest pain, edema or irregular ositiheartbeat. Positive for dyspnea on exertion. Gastrointestinal ROS: negative for - abdominal pain, diarrhea, hematemesis, nausea/vomiting or stool incontinence. Positive for a burning sensation in her stomach which stopped recently when she discontinued Naprosyn. Genito-Urinary ROS: negative for - dysuria, hematuria, incontinence or urinary frequency/urgency Musculoskeletal ROS: negative for - joint swelling or muscular weakness Neurological ROS: as noted in HPI Dermatological ROS: negative for rash and skin lesion changes   Physical Examination: Blood pressure 125/67, pulse 97, temperature 98.2 F (36.8 C), temperature source Oral, resp. rate 20, height 5\' 5"  (1.651 m), weight 121.972 kg (268 lb 14.4 oz), SpO2 97.00%.  Neurologic Examination: Mental Status: Alert, oriented, thought content appropriate.  Speech fluent without evidence of aphasia.  Able to follow 3 step commands without difficulty. Cranial Nerves: II: Discs not visualized; Visual fields grossly normal, pupils equal, round, reactive to light and accommodation III,IV, VI: ptosis not present, extra-ocular motions intact bilaterally V,VII: smile symmetric, facial light touch sensation normal bilaterally VIII: hearing normal  bilaterally IX,X: gag reflex present XI: bilateral shoulder shrug XII: midline tongue extension Motor: Right : Upper extremity   5/5    Left:     Upper extremity   5/5  Lower extremity   5/5     Lower extremity   5/5 Tone and bulk:normal tone throughout; no atrophy noted Sensory: Pinprick and light touch intact throughout, bilaterally Deep Tendon Reflexes: 2+ and symmetric throughout Plantars: Right: downgoing   Left: downgoing Cerebellar: normal finger-to-nose, normal rapid alternating movements and normal heel-to-shin test Gait: deferred at this time   Laboratory Studies:  Basic Metabolic Panel:  Recent Labs Lab 06/20/13 1833  NA 138  K 4.4  CL 100  CO2 22  GLUCOSE 127*  BUN 29*  CREATININE 1.36*  CALCIUM 9.4    Liver Function Tests:  Recent Labs Lab 06/20/13 1833  AST 23  ALT 18  ALKPHOS 51  BILITOT 0.2*  PROT 7.3  ALBUMIN 4.0   No results found for  this basename: LIPASE, AMYLASE,  in the last 168 hours No results found for this basename: AMMONIA,  in the last 168 hours  CBC:  Recent Labs Lab 06/20/13 1833  WBC 8.7  NEUTROABS 5.9  HGB 12.1  HCT 36.1  MCV 97.0  PLT 318    Cardiac Enzymes:  Recent Labs Lab 06/20/13 1833  TROPONINI <0.30    BNP: No components found with this basename: POCBNP,   CBG:  Recent Labs Lab 06/20/13 1841 06/21/13 0636 06/21/13 1145  GLUCAP 125* 106* 117*    Microbiology: Results for orders placed during the hospital encounter of 03/20/12  SURGICAL PCR SCREEN     Status: None   Collection Time    03/20/12  2:06 PM      Result Value Range Status   MRSA, PCR NEGATIVE  NEGATIVE Final   Staphylococcus aureus NEGATIVE  NEGATIVE Final   Comment:            The Xpert SA Assay (FDA     approved for NASAL specimens     in patients over 32 years of age),     is one component of     a comprehensive surveillance     program.  Test performance has     been validated by [employer] for patients greater      than or equal to 83 year old.     It is not intended     to diagnose infection nor to     guide or monitor treatment.    Coagulation Studies:  Recent Labs  06/20/13 1833  LABPROT 12.1  INR 0.91    Urinalysis:  Recent Labs Lab 06/20/13 1832  COLORURINE YELLOW  LABSPEC 1.020  PHURINE 5.5  GLUCOSEU NEGATIVE  HGBUR NEGATIVE  BILIRUBINUR NEGATIVE  KETONESUR NEGATIVE  PROTEINUR NEGATIVE  UROBILINOGEN 0.2  NITRITE NEGATIVE  LEUKOCYTESUR TRACE*    Lipid Panel:    Component Value Date/Time   CHOL 145 06/21/2013 0410   TRIG 175* 06/21/2013 0410   HDL 45 06/21/2013 0410   CHOLHDL 3.2 06/21/2013 0410   VLDL 35 06/21/2013 0410   LDLCALC 65 06/21/2013 0410    HgbA1C:  Lab Results  Component Value Date   HGBA1C 6.7* 06/21/2013    Urine Drug Screen:     Component Value Date/Time   LABOPIA NONE DETECTED 06/20/2013 1830   COCAINSCRNUR NONE DETECTED 06/20/2013 1830   LABBENZ NONE DETECTED 06/20/2013 1830   AMPHETMU NONE DETECTED 06/20/2013 1830   THCU NONE DETECTED 06/20/2013 1830   LABBARB NONE DETECTED 06/20/2013 1830    Alcohol Level:  Recent Labs Lab 06/20/13 1833  ETH <11    Other results: EKG: sinus rhythm rate 79 beats per minute.  Imaging:  Ct Head Wo Contrast 06/20/2013    Mild diffuse cortical atrophy. No acute intracranial abnormality seen.     Assessment: 68 y.o. female with at least 3 episodes of transient left upper extremity weakness with sensory disturbance. Recurrent TIAs cannot be ruled out. She is to have an MRI. An MRA should be considered as well. It might be worthwhile to perform an EEG to rule out possible seizure activity.   Stroke Risk Factors - diabetes mellitus and hypertension  Plan: 1. HgbA1c, fasting lipid panel 2. MRI, MRA  of the brain without contrast 3. PT consult, OT consult, Speech consult 4. Echocardiogram 5. Carotid dopplers 6. Prophylactic therapy- Plavix 75 mg daily 7. Risk factor modification 8. Telemetry  monitoring  9. Frequent neuro checks   Lowry Ram [facility] Pager [phone] 06/21/2013, 4:20 PM  I personally participate in this patient's evaluation and management, including formulating the above clinical assessment and management recommendations.  Rush Farmer M.D. [facility] [phone]

## 2013-06-21 NOTE — Progress Notes (Signed)
Utilization review completed.  

## 2013-06-21 NOTE — Progress Notes (Signed)
[facility]                                                                                Patient Demographics  [name], is a 68 y.o. female, DOB - 1946/06/03, UVO:[id number]  Admit date - 06/20/2013   Admitting Physician Kinnie Feil, MD  Outpatient Primary MD for the patient is Tivis Ringer, MD  LOS - 1   Chief Complaint  Patient presents with  . Numbness        Assessment & Plan    1. 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  Switch from aspirin to Plavix for now  Complete TIA/stroke workup and new to consulted.      2. HTN stable; cont home meds; titrate per response      3. DM 2 on diet control - no recent HA1C; patient does not take meds at home; ISS as needed  No results found for this basename: HGBA1C   CBG (last 3)   Recent Labs  06/20/13 1841 06/21/13 0636  GLUCAP 125* 106*        4. DJD, chronic pain; off opioids; methotrexate, cont prn tylenol;     5. Asymptomatic bacteruria; cont monitoring     6. Borderline creatinine. Avoid contrast, gentle hydration an outpatient followup by PCP .     Code Status: Full  Family Communication:    Disposition Plan: home   Procedures CT head, MRI MRA brain, echo, carotid duplex   Consults Neuro   Medications  Scheduled Meds: . clopidogrel  75 mg Oral Q breakfast  . DULoxetine  60 mg Oral Daily  . enoxaparin (LOVENOX) injection  40 mg Subcutaneous Q24H  . estrogens (conjugated)  0.625 mg Oral Daily  . hydrochlorothiazide  25 mg Oral Daily  . irbesartan  300 mg Oral Daily  . latanoprost  1 drop Right Eye QHS  . LORazepam  0.5 mg Intravenous Once  . medroxyPROGESTERone  5 mg Oral Daily  . metoprolol succinate  25 mg Oral Daily  . multivitamin with minerals  1  tablet Oral Daily  . pantoprazole  40 mg Oral Daily  . spironolactone  25 mg Oral Daily  . temazepam  30 mg Oral QHS  . Tofacitinib Citrate  5 mg Oral BID   Continuous Infusions:  PRN Meds:.acetaminophen  DVT Prophylaxis  Lovenox    Lab Results  Component Value Date   PLT 318 06/20/2013    Antibiotics   Anti-infectives   None          Subjective:   [name] today has, No headache, No chest pain, No abdominal pain - No Nausea, No new weakness tingling or numbness, No Cough - SOB.    Objective:   Filed Vitals:   06/21/13 0600 06/21/13 0822 06/21/13 0828 06/21/13 1031  BP: 113/56 94/42 97/34  136/65  Pulse: 84 73  85  Temp: 97.5 F (36.4 C) 97.8 F (36.6 C)  97.9 F (36.6 C)  TempSrc: Oral Oral  Oral  Resp: 18 16  18   Height:      Weight:      SpO2: 100% 96%  95%    Wt Readings from Last 3 Encounters:  06/20/13 121.972 kg (268 lb 14.4 oz)  06/06/13 122.471 kg (270 lb)  03/25/12 113.399 kg (250 lb)    No intake or output data in the 24 hours ending 06/21/13 1051  Exam Awake Alert, Oriented X 3, No new F.N deficits, Normal affect [place].AT,PERRAL Supple Neck,No JVD, No cervical lymphadenopathy appriciated.  Symmetrical Chest wall movement, Good air movement bilaterally, CTAB RRR,No Gallops,Rubs or new Murmurs, No Parasternal Heave +ve B.Sounds, Abd Soft, Non tender, No organomegaly appriciated, No rebound - guarding or rigidity. No Cyanosis, Clubbing or edema, No new Rash or bruise    Data Review   Micro Results No results found for this or any previous visit (from the past 240 hour(s)).  Radiology Reports Ct Head Wo Contrast  06/20/2013   CLINICAL DATA:  Transient ischemic attack, numbness.  EXAM: CT HEAD WITHOUT CONTRAST  TECHNIQUE: Contiguous axial images were obtained from the base of the skull through the vertex without intravenous contrast.  COMPARISON:  CT scan of October 06, 2007.  FINDINGS: Bony calvarium appears intact. Mild diffuse cortical  atrophy is noted. No mass effect or midline shift is noted. Ventricular size is within normal limits. There is no evidence of mass lesion, hemorrhage or acute infarction.  IMPRESSION: Mild diffuse cortical atrophy. No acute intracranial abnormality seen.   Electronically Signed   By: Sabino Dick M.D.   On: 06/20/2013 18:29    CBC  Recent Labs Lab 06/20/13 1833  WBC 8.7  HGB 12.1  HCT 36.1  PLT 318  MCV 97.0  MCH 32.5  MCHC 33.5  RDW 13.8  LYMPHSABS 1.5  MONOABS 1.2*  EOSABS 0.2  BASOSABS 0.0    Chemistries   Recent Labs Lab 06/20/13 1833  NA 138  K 4.4  CL 100  CO2 22  GLUCOSE 127*  BUN 29*  CREATININE 1.36*  CALCIUM 9.4  AST 23  ALT 18  ALKPHOS 51  BILITOT 0.2*   ------------------------------------------------------------------------------------------------------------------ estimated creatinine clearance is 51.9 ml/min (by C-G formula based on Cr of 1.36). ------------------------------------------------------------------------------------------------------------------ No results found for this basename: HGBA1C,  in the last 72 hours ------------------------------------------------------------------------------------------------------------------  Recent Labs  06/21/13 0410  CHOL 145  HDL 45  LDLCALC 65  TRIG 175*  CHOLHDL 3.2   ------------------------------------------------------------------------------------------------------------------ No results found for this basename: TSH, T4TOTAL, FREET3, T3FREE, THYROIDAB,  in the last 72 hours ------------------------------------------------------------------------------------------------------------------ No results found for this basename: VITAMINB12, FOLATE, FERRITIN, TIBC, IRON, RETICCTPCT,  in the last 72 hours  Coagulation profile  Recent Labs Lab 06/20/13 1833  INR 0.91    No results found for this basename: DDIMER,  in the last 72 hours  Cardiac Enzymes  Recent Labs Lab 06/20/13 1833   TROPONINI <0.30   ------------------------------------------------------------------------------------------------------------------ No components found with this basename: POCBNP,      Time Spent in minutes   35   Lala Lund K M.D on 06/21/2013 at 10:51 AM  Between 7am to 7pm - Pager - [phone]  After 7pm go to [email or web address] - password [email or web address]  And look for the night coverage person covering for me after hours  [facility] Office  [phone]

## 2013-06-21 NOTE — Progress Notes (Signed)
RN called to room. Pt stated she thought she was having another TIA. Pt left hand was weak and numb. Within two minutes symptoms resolved. MD notified. Orders given.

## 2013-06-22 ENCOUNTER — Observation Stay (HOSPITAL_COMMUNITY): Payer: Medicare Other

## 2013-06-22 DIAGNOSIS — R0602 Shortness of breath: Secondary | ICD-10-CM | POA: Diagnosis not present

## 2013-06-22 DIAGNOSIS — G8929 Other chronic pain: Secondary | ICD-10-CM | POA: Diagnosis not present

## 2013-06-22 DIAGNOSIS — R29818 Other symptoms and signs involving the nervous system: Secondary | ICD-10-CM | POA: Diagnosis not present

## 2013-06-22 DIAGNOSIS — G459 Transient cerebral ischemic attack, unspecified: Secondary | ICD-10-CM | POA: Diagnosis not present

## 2013-06-22 DIAGNOSIS — R29898 Other symptoms and signs involving the musculoskeletal system: Secondary | ICD-10-CM

## 2013-06-22 DIAGNOSIS — I672 Cerebral atherosclerosis: Secondary | ICD-10-CM | POA: Diagnosis not present

## 2013-06-22 DIAGNOSIS — E119 Type 2 diabetes mellitus without complications: Secondary | ICD-10-CM | POA: Diagnosis not present

## 2013-06-22 DIAGNOSIS — I5042 Chronic combined systolic (congestive) and diastolic (congestive) heart failure: Secondary | ICD-10-CM | POA: Diagnosis not present

## 2013-06-22 DIAGNOSIS — I10 Essential (primary) hypertension: Secondary | ICD-10-CM | POA: Diagnosis not present

## 2013-06-22 LAB — PRO B NATRIURETIC PEPTIDE: Pro B Natriuretic peptide (BNP): 444.9 pg/mL — ABNORMAL HIGH (ref 0–125)

## 2013-06-22 LAB — BASIC METABOLIC PANEL
BUN: 19 mg/dL (ref 6–23)
CALCIUM: 8.5 mg/dL (ref 8.4–10.5)
CO2: 23 mEq/L (ref 19–32)
Chloride: 103 mEq/L (ref 96–112)
Creatinine, Ser: 1.07 mg/dL (ref 0.50–1.10)
GFR, EST AFRICAN AMERICAN: 60 mL/min — AB (ref >90)
GFR, EST NON AFRICAN AMERICAN: 52 mL/min — AB (ref >90)
Glucose, Bld: 115 mg/dL — ABNORMAL HIGH (ref 70–99)
POTASSIUM: 4 meq/L (ref 3.7–5.3)
SODIUM: 139 meq/L (ref 137–147)

## 2013-06-22 LAB — GLUCOSE, CAPILLARY
GLUCOSE-CAPILLARY: 115 mg/dL — AB (ref 70–99)
Glucose-Capillary: 114 mg/dL — ABNORMAL HIGH (ref 70–99)
Glucose-Capillary: 117 mg/dL — ABNORMAL HIGH (ref 70–99)
Glucose-Capillary: 118 mg/dL — ABNORMAL HIGH (ref 70–99)

## 2013-06-22 MED ORDER — FUROSEMIDE 20 MG PO TABS
20.0000 mg | ORAL_TABLET | Freq: Once | ORAL | Status: DC
  Filled 2013-06-22: qty 1

## 2013-06-22 MED ORDER — IOHEXOL 350 MG/ML SOLN
100.0000 mL | Freq: Once | INTRAVENOUS | Status: AC | PRN
  Administered 2013-06-22: 100 mL via INTRAVENOUS

## 2013-06-22 MED ORDER — SODIUM CHLORIDE 0.9 % IV SOLN
INTRAVENOUS | Status: AC
  Administered 2013-06-22: 12:00:00 via INTRAVENOUS

## 2013-06-22 NOTE — Consult Note (Signed)
CARDIOLOGY CONSULT NOTE  Patient ID: [name] MRN: [id number] DOB/AGE: 69/27/1947 69 y.o.  Admit date: 06/20/2013 Referring Physician  Lala Lund, MD Primary Physician:  Tivis Ringer, MD Reason for Consultation  Tachycardia and CAD  HPI: The patient is a 68 year old female who presents for evaluation of abdominal discomfort.  Patient has chronic fibromyalgia and has been on NSAIDs for a long time, had developed significant abdominal discomfort and well-being evaluated she also complained of left arm weakness of the sudden in onset 2 days ago.  She had a recurrence of the same yesterday again she states that her whole left side of the arms and left upper body felt very weak.  No other associated symptoms of slurred speech, visual disturbances, altered mental status.  She is being worked up for TIA.  Nursing has also noticed that with physical activity, urine minimal activity she gets profoundly tachycardic with a heart rate jumping from 140-150 bpm from resting heart rate of 80s.  Due to worsening dyspnea over the past one year and marked tachycardic response even with been able activity, she also underwent CT angiogram of the chest this morning which did not reveal any pulmonary embolus however did reveal pulmonary infiltrates.  Patient states that over the past one year she has not felt well, noticed worsening dyspnea, worsening fatigue, worsening ill feeling.  She also has history of known coronary artery disease, had CABG in 1986 (records not available).She also has HTN, Hypercholesterolemia, DM type 2, Morbid Obesity, Rheumatoid arthritis, Fobromyalgia, OSA and prior h/o TIA.  Past Medical History  Diagnosis Date  . Hypertension   . Dysrhythmia   . Sleep apnea     sleep study  oct 2012  . Anginal pain     sees Dr Einar Gip.   . Diabetes mellitus without complication     no meds -diet controlled  . Stroke 2011    [place]    . Peripheral vascular disease     legs   . GERD (gastroesophageal reflux disease)   . Arthritis     rheumatoid ..   . Fibromyalgia   . Liver disease 08- 2012    per Dr Dagmar Hait pt has enlarged liver     Past Surgical History  Procedure Laterality Date  . Coronary artery bypass graft  1986  . Cardiac catheterization  1996  1986  . Eye surgery  2013    cat ext bilateral .  . Eye surgery  2002    laser  . Fracture surgery  -1988- 2007    rod right leg - right wrist plate  . Cholecystectomy  1996  . Pars plana vitrectomy  03/25/2012    Procedure: PARS PLANA VITRECTOMY WITH 25 GAUGE;  Surgeon: Hayden Pedro, MD;  Location: [facility];  Service: Ophthalmology;  Laterality: Right;  . Gas/fluid exchange  03/25/2012    Procedure: GAS/FLUID EXCHANGE;  Surgeon: Hayden Pedro, MD;  Location: [facility];  Service: Ophthalmology;  Laterality: Right;     Family History  Problem Relation Age of Onset  . Hypertension Daughter      Social History: History   Social History  . Marital Status: Married    Spouse Name: N/A    Number of Children: N/A  . Years of Education: N/A   Occupational History  . Not on file.   Social History Main Topics  . Smoking status: Former Research scientist (life sciences)  . Smokeless tobacco: Not on file  . Alcohol Use: 0.0 oz/week  Comment: occ  . Drug Use: No  . Sexual Activity: Yes   Other Topics Concern  . Not on file   Social History Narrative  . No narrative on file     Prescriptions prior to admission  Medication Sig Dispense Refill  . aspirin EC 81 MG tablet Take 81 mg by mouth daily.      Marland Kitchen esomeprazole (NEXIUM) 40 MG capsule Take 40 mg by mouth 2 (two) times daily.      Marland Kitchen estrogen, conjugated,-medroxyprogesterone (PREMPRO) 0.625-5 MG per tablet Take 1 tablet by mouth daily.      Marland Kitchen HYDROcodone-acetaminophen (NORCO) 7.5-325 MG per tablet Take 1 tablet by mouth daily as needed for moderate pain.      . metoprolol succinate (TOPROL-XL) 25 MG 24 hr tablet Take 25 mg by mouth daily.      .  Olmesartan-Amlodipine-HCTZ (TRIBENZOR) 40-5-25 MG TABS Take 1 tablet by mouth daily.      Donell Sievert IN Inhale into the lungs as needed.      . pravastatin (PRAVACHOL) 40 MG tablet Take 40 mg by mouth at bedtime.      Marland Kitchen spironolactone (ALDACTONE) 25 MG tablet Take 25 mg by mouth at bedtime.      . Tafluprost (ZIOPTAN) 0.0015 % SOLN Place 1 drop into both eyes at bedtime.      . temazepam (RESTORIL) 30 MG capsule Take 30 mg by mouth at bedtime.      . Tofacitinib Citrate (XELJANZ) 5 MG TABS Take 5 mg by mouth 2 (two) times daily.        Scheduled Meds: . clopidogrel  75 mg Oral Q breakfast  . DULoxetine  60 mg Oral Daily  . enoxaparin (LOVENOX) injection  40 mg Subcutaneous Q24H  . estrogens (conjugated)  0.625 mg Oral Daily  . furosemide  20 mg Oral Once  . hydrochlorothiazide  25 mg Oral Daily  . latanoprost  1 drop Right Eye QHS  . medroxyPROGESTERone  5 mg Oral Daily  . metoprolol succinate  25 mg Oral Daily  . multivitamin with minerals  1 tablet Oral Daily  . pantoprazole  40 mg Oral Daily  . spironolactone  25 mg Oral Daily  . temazepam  30 mg Oral QHS  . Tofacitinib Citrate  5 mg Oral BID   Continuous Infusions:  PRN Meds:.acetaminophen, alum & mag hydroxide-simeth, ondansetron (ZOFRAN) IV  ROS: General:Present- Fatigue and Feeling well. Not Present- Fever, Night Sweats, Weight Gain > 10lbs. and Weight Loss > 10lbs.. Skin:Not Present- Itching and Rash. HEENT:Not Present- Headache. Respiratory:Present- Difficulty Breathing. Cardiovascular:Present- Swelling of Extremities ((Mild)). Not Present- Claudications, Fainting and Orthopnea. Gastrointestinal:Not Present- Abdominal Pain, Constipation, Diarrhea, Nausea and Vomiting. Musculoskeletal:Present- Back Pain, Backache and Joint Pain. Not Present- Joint Swelling. Neurological:Not Present- Headaches. Hematology:Not Present- Blood Clots, Easy Bruising and Nose Bleed.    Physical Exam: Blood pressure 132/74,  pulse 87, temperature 98.1 F (36.7 C), temperature source Oral, resp. rate 18, height 5\' 5"  (1.651 m), weight 121.972 kg (268 lb 14.4 oz), SpO2 92.00%.    General: Well built and Morbidly obese body habitus who is in no acute distress. Appears stated age. Alert Ox3.   There is no cyanosis. HEENT: normal limits. PERRLA, No JVD.   CARDIAC EXAM: S1, S2 normal, no gallop present. No murmur.   CHEST EXAM: No tenderness of chest wall. LUNGS: Clear to percuss and auscultate.  ABDOMEN: V. obese. Pannus present. No obvious hepatosplenomegaly. BS normal in all 4 quadrants. Abdomen is non-tender.  EXTREMITY: No clubbing or cyanosis, Minimal edema on legs. No calf tenderness, no cords palpable, Homan's sign- Negative.  NEUROLOGIC EXAM: Grossly intact without any focal deficits. Alert O x 3.   VASCULAR EXAM: No skin breakdown. Carotids normal. Extremities: Femoral pulse distant and feeble without bruit. Popliteal pulse feeblel ; Pedal pulse normal. Otherwise No prominent pulse felt in the abdomen. No varicose veins.  Labs:   Lab Results  Component Value Date   WBC 8.7 06/20/2013   HGB 12.1 06/20/2013   HCT 36.1 06/20/2013   MCV 97.0 06/20/2013   PLT 318 06/20/2013    Recent Labs Lab 06/20/13 1833 06/22/13 0346  NA 138 139  K 4.4 4.0  CL 100 103  CO2 22 23  BUN 29* 19  CREATININE 1.36* 1.07  CALCIUM 9.4 8.5  PROT 7.3  --   BILITOT 0.2*  --   ALKPHOS 51  --   ALT 18  --   AST 23  --   GLUCOSE 127* 115*   Lab Results  Component Value Date   TROPONINI <0.30 06/20/2013    Lipid Panel     Component Value Date/Time   CHOL 145 06/21/2013 0410   TRIG 175* 06/21/2013 0410   HDL 45 06/21/2013 0410   CHOLHDL 3.2 06/21/2013 0410   VLDL 35 06/21/2013 0410   LDLCALC 65 06/21/2013 0410    EKG: normal sinus rhythm at rate of 76 bpm, normal axis.  Incomplete right bundle branch block.  Nonspecific diffuse the abnormality.  No significant change from prior EKG.    Radiology:   IMPRESSION: Within the limitations of respiratory motion artifact and artifact caused by the patient's habitus, no pulmonary embolus is noted.  Mosaic pulmonary parenchymal changes throughout both lungs. Considerations include changes secondary to asymmetric pulmonary edema, chronic changes, pneumonitis whether related to inflammatory process or infectious process or combination of the above.  Slight nodularity lung parenchyma in the most notable anterior right middle lobe measuring up to 6.3 mm (7 series 7, image 56). If the patient is at high risk for bronchogenic carcinoma, follow-up chest CT at 6-12 months is recommended. If the patient is at low risk for bronchogenic carcinoma, follow-up chest CT at 12 months is recommended. This recommendation follows the consensus statement: Guidelines for Management of Small Pulmonary Nodules Detected on CT Scans: A Statement from the [place] as published in Radiology 2005;237:395-400.  Post CABG.  Cardiomegaly.   Electronically Signed   By: Chauncey Cruel M.D.   On: 06/22/2013 13:59   MRI brain: 06/11/2013: IMPRESSION: No acute infarct.  Intracranial atherosclerotic type changes as detailed above.   Electronically Signed   By: Chauncey Cruel M.D.   On: 06/22/2013 08:56    ASSESSMENT AND PLAN:  1. Shortness of breath and dyspnea on exertion. 2. Fatigue 3. Fibromyalgia 4. Morbid obesity with alveolar hypoventilation. 5. Coronary atherosclerosis of native coronary artery  EKG 08/27/2012 SR @ 91/min with left atrial abnormality. Low voltage complexes. NO ischemia. Otherwise normal EKG.  6. CAD S/P CABG 1986 ([place]) records not available  Lexiscan Stress: 02/16/11: Non diagnostic EKG. Very small apical ischemia vs gut artifact. Normal EF. Low risk stress.  Echo 1/12: normal echocardiogram. Poor echo window. Echo 06/22/2013: Hyperdynamic LV. Mod. RV dilatation. No significant valvular abnormality. Poor echo window.  7. Type II or unspecified type diabetes  mellitus without mention of complication, uncontrolled (250.02)  8. Pure hypercholesterolemia   9. History of TIA (transient ischemic attack) and stroke  Admitted  in [place], [place] in January 2012 with TIA. She developed numbness in the left side of her face and also left arm. MRI Head 06/28/10 No Stroke. Now admitted with left arm weakness and has had two episodes in the hospital and last episode yesterday.  Carotid duplex pending.   Recommendation: From cardiac standpoint I do not think that she needs any further  Cardiac evaluation at this time in the inpatient setting.  Her marked dyspnea is due to obesity hypoventilation and tachycardia could be a sign of deconditioning versus chronic cor pulmonale due to morbid obesity and obesity hypoventilation syndrome.  Echocardiogram does reveal moderate RV dilatation, however the RV systolic function was well-preserved.  The  CT scan of the chest did not reveal any significant pulmonary arterial dilatation hence pulmonary hypertension is probably less likely so is chronic pulmonary embolism.  I would recommend holding off on giving Lasix tonight, agree with following up off BNP, consider furosemide in the morning.  Will also consider increasing beta blockers as tolerated.  I am not very convinced whether she has truly had TIAs however I'll leave to the judgment of  Neurology at this point.  Patient is on appropriate medical therapy.  I'll make a final recommendation after the availability of BMP in the morning.   Laverda Page, MD 06/22/2013, 10:07 PM [facility]. [place] Pager: [phone] Office: [phone] If no answer Cell [phone]

## 2013-06-22 NOTE — Progress Notes (Signed)
VASCULAR LAB PRELIMINARY  PRELIMINARY  PRELIMINARY  PRELIMINARY  Carotid duplex  completed.    Preliminary report:  Bilateral:  1-39% ICA stenosis.  Vertebral artery flow is antegrade.      Cooper Stamp, RVT 06/22/2013, 4:36 PM

## 2013-06-22 NOTE — Progress Notes (Signed)
Pateint's heart rate goes upto 150s and become SOB when walk to the bathroom, But once she is back HR sustains in 90s-100s and oxygen Sat at 99% on RA

## 2013-06-22 NOTE — Progress Notes (Signed)
Stroke Team Progress Note  HISTORY [name] is a 69 y.o. female who was seated in a chair yesterday 06/20/2013 at home when around 4 PM when she noticed that her left arm felt tingly. She went to raise her left arm and found it to be weak and difficult to control. According to the patient it was flopping around. She tried to reach behind her and pickup the phone but she could not hold the phone in her hand. Her fingers seem to contract. The episode lasted about 3 minutes. She immediately took 4 baby aspirin and had her husband bring her to the emergency department and she was admitted.   She reports that 5 years ago she had a similar episode which was diagnosed as a TIA. This occurred in [facility]. She was seen at the hospital there and had an MRI which apparently was unrevealing. She has been taking aspirin 81 mg daily ever since. That episode also lasted approximately 3 minutes.  Today 06/21/2013, while in the hospital, at approximately 10 or 11 AM the patient had a third such episode which was witnessed by her husband. The nurse was called; however, the episode had essentially resolved by the time the nurse arrived. The patient was changed to Plavix and a neurology consult was requested. An MRI has also been ordered.   Patient was not a TPA candidate secondary to symptom resolution.   SUBJECTIVE Her husband is at the bedside, she is in the EEG lab undergoing testing.  Overall she feels her condition is stable.   OBJECTIVE Most recent Vital Signs: Filed Vitals:   06/21/13 2140 06/22/13 0149 06/22/13 0544 06/22/13 1003  BP: 132/53 133/53 122/48 138/54  Pulse: 95 95 86 71  Temp: 98.6 F (37 C) 97.9 F (36.6 C) 98.1 F (36.7 C) 97.6 F (36.4 C)  TempSrc: Oral Oral Oral Oral  Resp: 18 18 18 18   Height:      Weight:      SpO2: 92% 98% 94% 93%   CBG (last 3)   Recent Labs  06/21/13 1723 06/21/13 2142 06/22/13 0642  GLUCAP 162* 125* 114*    IV Fluid Intake:    . sodium chloride      MEDICATIONS  . clopidogrel  75 mg Oral Q breakfast  . DULoxetine  60 mg Oral Daily  . enoxaparin (LOVENOX) injection  40 mg Subcutaneous Q24H  . estrogens (conjugated)  0.625 mg Oral Daily  . hydrochlorothiazide  25 mg Oral Daily  . latanoprost  1 drop Right Eye QHS  . medroxyPROGESTERone  5 mg Oral Daily  . metoprolol succinate  25 mg Oral Daily  . multivitamin with minerals  1 tablet Oral Daily  . pantoprazole  40 mg Oral Daily  . spironolactone  25 mg Oral Daily  . temazepam  30 mg Oral QHS  . Tofacitinib Citrate  5 mg Oral BID   PRN:  acetaminophen, alum & mag hydroxide-simeth, ondansetron (ZOFRAN) IV  Diet:  Cardiac thin liquids Activity:   Bathroom privileges with assistance DVT Prophylaxis:  Lovenox 40 mg sq daily   CLINICALLY SIGNIFICANT STUDIES Basic Metabolic Panel:   Recent Labs Lab 06/20/13 1833 06/22/13 0346  NA 138 139  K 4.4 4.0  CL 100 103  CO2 22 23  GLUCOSE 127* 115*  BUN 29* 19  CREATININE 1.36* 1.07  CALCIUM 9.4 8.5   Liver Function Tests:   Recent Labs Lab 06/20/13 1833  AST 23  ALT 18  ALKPHOS  51  BILITOT 0.2*  PROT 7.3  ALBUMIN 4.0   CBC:   Recent Labs Lab 06/20/13 1833  WBC 8.7  NEUTROABS 5.9  HGB 12.1  HCT 36.1  MCV 97.0  PLT 318   Coagulation:   Recent Labs Lab 06/20/13 1833  LABPROT 12.1  INR 0.91   Cardiac Enzymes:   Recent Labs Lab 06/20/13 1833  TROPONINI <0.30   Urinalysis:   Recent Labs Lab 06/20/13 1832  COLORURINE YELLOW  LABSPEC 1.020  PHURINE 5.5  GLUCOSEU NEGATIVE  HGBUR NEGATIVE  BILIRUBINUR NEGATIVE  KETONESUR NEGATIVE  PROTEINUR NEGATIVE  UROBILINOGEN 0.2  NITRITE NEGATIVE  LEUKOCYTESUR TRACE*   Lipid Panel    Component Value Date/Time   CHOL 145 06/21/2013 0410   TRIG 175* 06/21/2013 0410   HDL 45 06/21/2013 0410   CHOLHDL 3.2 06/21/2013 0410   VLDL 35 06/21/2013 0410   LDLCALC 65 06/21/2013 0410   HgbA1C  Lab Results  Component Value Date   HGBA1C  6.7* 06/21/2013    Urine Drug Screen:     Component Value Date/Time   LABOPIA NONE DETECTED 06/20/2013 1830   COCAINSCRNUR NONE DETECTED 06/20/2013 1830   LABBENZ NONE DETECTED 06/20/2013 1830   AMPHETMU NONE DETECTED 06/20/2013 1830   THCU NONE DETECTED 06/20/2013 1830   LABBARB NONE DETECTED 06/20/2013 1830    Alcohol Level:   Recent Labs Lab 06/20/13 1833  ETH <11     CT of the brain  06/20/2013    Transient ischemic attack, numbness.   Mild diffuse cortical atrophy. No acute intracranial abnormality seen.   MRI of the brain  06/22/2013    No acute infarct.   MRA of the brain  06/22/2013    Intracranial atherosclerotic type changes   2D Echocardiogram    Carotid Doppler  No evidence of hemodynamically significant internal carotid artery stenosis. Vertebral artery flow is antegrade.   EKG  Normal sinus rhythm. Low voltage QRS. Nonspecific T wave abnormality   EEG    Therapy Recommendations   Physical Exam   Middle aged obese lady not in distress.Awake alert. Afebrile. Head is nontraumatic. Neck is supple without bruit. Hearing is normal. Cardiac exam no murmur or gallop. Lungs are clear to auscultation. Distal pulses are well felt. Neurological Exam ;  Awake  Alert oriented x 3. Normal speech and language.eye movements full without nystagmus.fundi were not visualized. Vision acuity and fields appear normal. Hearing is normal. Palatal movements are normal. Face symmetric. Tongue midline. Normal strength, tone, reflexes and coordination. Normal sensation. Gait deferred. ASSESSMENT [name] is a 68 y.o. female presenting with left upper extremity weakness and numbness. Imaging confirms no acute infarct. Diagnosis: Right brain TIA. TIA etiology unclear at this time. On aspirin 81 mg orally every day prior to admission. Now on clopidogrel 75 mg orally every day for secondary stroke prevention. Patient with resultant resolved neurologic deficits. Work up  underway.   Hypertension  Obstructive sleep apnea  Diabetes, hemoglobin A1c 6.7  History of stroke 2011, [place]  Coronary artery disease-CABG 1986  Alcohol use Hyperlipidemia, LDL 65, on Pravachol 40 PTA, now on no statin, goal LDL < 100 (< 70 for diabetics)   Hospital day # 2  TREATMENT/PLAN  Continue clopidogrel 75 mg orally every day for secondary stroke prevention.  Resume statin  Follow 2-D echocardiogram  Follow up EEG  SHARON BIBY, MSN, RN, ANVP-BC, ANP-BC, GNP-BC [facility] Pager: [phone] 06/22/2013 11:18 AM  I have personally  obtained a history, examined the patient, evaluated imaging results, and formulated the assessment and plan of care. I agree with the above. Antony Contras, MD

## 2013-06-22 NOTE — Progress Notes (Signed)
Pt removed cpap saying it makes too much noise.  The machine she was placed on was the only type we have available.  Rt removed machine from pts room, as to not to charge pt for the night, but left the circuit and mask in case the pt changes her mind.

## 2013-06-22 NOTE — Progress Notes (Addendum)
[facility]                                                                                Patient Demographics  [name], is a 68 y.o. female, DOB - 10-10-1945, [id number]  Admit date - 06/20/2013   Admitting Physician Kinnie Feil, MD  Outpatient Primary MD for the patient is Tivis Ringer, MD  LOS - 2   Chief Complaint  Patient presents with  . Numbness        Assessment & Plan     1.L arm weakness, paraesthesia likely TIA; r/o CVA vs probable C spine DJD, but + risk factors for CVA -   Left arm pain and numbness again happened this morning, so far negative CT head, completely resolved with no focal deficits at this time.  Switch from aspirin to Plavix for now  Complete TIA/stroke workup and new to consulted.  Neuro seeing patient - EEG ordered by [facility] is pending along with echogram and carotid duplex, MRI MRA brain which appears stable       2. HTN stable; cont home meds; titrate per response      3. DM 2 on diet control - no recent HA1C; patient does not take meds at home; ISS as needed  Lab Results  Component Value Date   HGBA1C 6.7* 06/21/2013   CBG (last 3)   Recent Labs  06/21/13 1723 06/21/13 2142 06/22/13 0642  GLUCAP 162* 125* 114*        4. DJD, chronic pain; off opioids; methotrexate, cont prn tylenol;      5. Asymptomatic bacteruria; cont monitoring      6. Borderline creatinine. Stable now after gentle hydration continue, follow with PCP post DC.    7. She provides long-standing history of exertional shortness of breath and palpitations. She was due for her outpatient stress test by cardiologist Dr. Einar Gip, discussed the case with him in detail. Prudent to rule out PE. Will order CT angiogram also.     Code Status: Full  Family Communication:  husband  Disposition Plan: home   Procedures CT head, MRI MRA brain, echo, carotid duplex, EEG, CTA chest   EEG -  This is a normal EEG recording  during wakefulness. No evidence of an epileptic disorder was demonstrated.    Carotid - Bilateral: 1-39% ICA stenosis. Vertebral artery flow is antegrade.    Echo -    Consults Neuro, Cards   Medications  Scheduled Meds: . clopidogrel  75 mg Oral Q breakfast  . DULoxetine  60 mg Oral Daily  . enoxaparin (LOVENOX) injection  40 mg Subcutaneous Q24H  . estrogens (conjugated)  0.625 mg Oral Daily  . hydrochlorothiazide  25 mg Oral Daily  . latanoprost  1 drop Right Eye QHS  . medroxyPROGESTERone  5 mg Oral Daily  . metoprolol succinate  25 mg Oral Daily  . multivitamin with minerals  1 tablet Oral Daily  . pantoprazole  40 mg Oral Daily  . spironolactone  25 mg Oral Daily  . temazepam  30 mg Oral QHS  . Tofacitinib Citrate  5 mg Oral BID   Continuous Infusions: . sodium chloride  PRN Meds:.acetaminophen, alum & mag hydroxide-simeth, ondansetron (ZOFRAN) IV  DVT Prophylaxis  Lovenox    Lab Results  Component Value Date   PLT 318 06/20/2013    Antibiotics   Anti-infectives   None          Subjective:   [name] today has, No headache, No chest pain, No abdominal pain - No Nausea, No new weakness tingling or numbness, No Cough - SOB.    Objective:   Filed Vitals:   06/21/13 2140 06/22/13 0149 06/22/13 0544 06/22/13 1003  BP: 132/53 133/53 122/48 138/54  Pulse: 95 95 86 71  Temp: 98.6 F (37 C) 97.9 F (36.6 C) 98.1 F (36.7 C) 97.6 F (36.4 C)  TempSrc: Oral Oral Oral Oral  Resp: 18 18 18 18   Height:      Weight:      SpO2: 92% 98% 94% 93%    Wt Readings from Last 3 Encounters:  06/20/13 121.972 kg (268 lb 14.4 oz)  06/06/13 122.471 kg (270 lb)  03/25/12 113.399 kg (250 lb)     Intake/Output Summary (Last 24 hours) at 06/22/13 1129 Last data filed at 06/22/13 0900  Gross per 24 hour  Intake    600 ml  Output      0 ml  Net    600 ml    Exam Awake Alert, Oriented X 3, No new F.N deficits, Normal affect [place].AT,PERRAL Supple  Neck,No JVD, No cervical lymphadenopathy appriciated.  Symmetrical Chest wall movement, Good air movement bilaterally, CTAB RRR,No Gallops,Rubs or new Murmurs, No Parasternal Heave +ve B.Sounds, Abd Soft, Non tender, No organomegaly appriciated, No rebound - guarding or rigidity. No Cyanosis, Clubbing or edema, No new Rash or bruise    Data Review   Micro Results No results found for this or any previous visit (from the past 240 hour(s)).  Radiology Reports Ct Head Wo Contrast  06/20/2013   CLINICAL DATA:  Transient ischemic attack, numbness.  EXAM: CT HEAD WITHOUT CONTRAST  TECHNIQUE: Contiguous axial images were obtained from the base of the skull through the vertex without intravenous contrast.  COMPARISON:  CT scan of October 06, 2007.  FINDINGS: Bony calvarium appears intact. Mild diffuse cortical atrophy is noted. No mass effect or midline shift is noted. Ventricular size is within normal limits. There is no evidence of mass lesion, hemorrhage or acute infarction.  IMPRESSION: Mild diffuse cortical atrophy. No acute intracranial abnormality seen.   Electronically Signed   By: Sabino Dick M.D.   On: 06/20/2013 18:29    CBC  Recent Labs Lab 06/20/13 1833  WBC 8.7  HGB 12.1  HCT 36.1  PLT 318  MCV 97.0  MCH 32.5  MCHC 33.5  RDW 13.8  LYMPHSABS 1.5  MONOABS 1.2*  EOSABS 0.2  BASOSABS 0.0    Chemistries   Recent Labs Lab 06/20/13 1833 06/22/13 0346  NA 138 139  K 4.4 4.0  CL 100 103  CO2 22 23  GLUCOSE 127* 115*  BUN 29* 19  CREATININE 1.36* 1.07  CALCIUM 9.4 8.5  AST 23  --   ALT 18  --   ALKPHOS 51  --   BILITOT 0.2*  --    ------------------------------------------------------------------------------------------------------------------ estimated creatinine clearance is 65.9 ml/min (by C-G formula based on Cr of 1.07). ------------------------------------------------------------------------------------------------------------------  Recent Labs   06/21/13 0410  HGBA1C 6.7*   ------------------------------------------------------------------------------------------------------------------  Recent Labs  06/21/13 0410  CHOL 145  HDL 45  LDLCALC 65  TRIG 175*  CHOLHDL  3.2   ------------------------------------------------------------------------------------------------------------------ No results found for this basename: TSH, T4TOTAL, FREET3, T3FREE, THYROIDAB,  in the last 72 hours ------------------------------------------------------------------------------------------------------------------ No results found for this basename: VITAMINB12, FOLATE, FERRITIN, TIBC, IRON, RETICCTPCT,  in the last 72 hours  Coagulation profile  Recent Labs Lab 06/20/13 1833  INR 0.91    No results found for this basename: DDIMER,  in the last 72 hours  Cardiac Enzymes  Recent Labs Lab 06/20/13 1833  TROPONINI <0.30   ------------------------------------------------------------------------------------------------------------------ No components found with this basename: POCBNP,      Time Spent in minutes   35   Lala Lund K M.D on 06/22/2013 at 11:29 AM  Between 7am to 7pm - Pager - [phone]  After 7pm go to [email or web address] - password [email or web address]  And look for the night coverage person covering for me after hours  [facility] Office  [phone]

## 2013-06-22 NOTE — Progress Notes (Signed)
EEG completed; results pending.    

## 2013-06-22 NOTE — Procedures (Signed)
ELECTROENCEPHALOGRAM REPORT  Patient: [name]       Room #: 6Q22 EEG No. ID: [id number] Age: 68 y.o.        Sex: female Referring Physician: Signh Report Date:  06/22/2013        Interpreting Physician: Anthony Sar  History: [name] is an 68 y.o. female with recurrent spells of transient weakness and numbness involving left upper extremity.  Indications for study:  Rule out focal seizure disorder.  Technique: This is an 18 channel routine scalp EEG performed at the bedside with bipolar and monopolar montages arranged in accordance to the international 10/20 system of electrode placement.   Description: This EEG recording was performed during wakefulness. The background activity consists of 9 Hz symmetrical alpha rhythm recorder from posterior head regions as well as low amplitude beta activity reported from central and frontal regions. Photic stimulation was not performed. Hyperventilation was not performed. No epileptiform discharges were recorded. Significant muscle artifact was recorded throughout the study. There was no abnormal slowing of cerebral activity.  Interpretation: This is a normal EEG recording during wakefulness. No evidence of an epileptic disorder was demonstrated.   Rush Farmer M.D. [facility] [phone]

## 2013-06-22 NOTE — Progress Notes (Signed)
Echo Lab  2D Echocardiogram completed.  [place], RDCS 06/22/2013 8:50 AM

## 2013-06-22 NOTE — Progress Notes (Signed)
Pt has refused use of cpap for the night.  She states she was placed on our machine and mask last night and it was terribly uncomfortable and was too loud.  She had planned to have her husband bring hers from home but forgot, but says she will be fine w/o it for the night and she is being discharged tomorrow.  Rt left circuit and mask in case pt decided to use cpap.

## 2013-06-23 DIAGNOSIS — G459 Transient cerebral ischemic attack, unspecified: Secondary | ICD-10-CM | POA: Diagnosis not present

## 2013-06-23 LAB — GLUCOSE, CAPILLARY
Glucose-Capillary: 108 mg/dL — ABNORMAL HIGH (ref 70–99)
Glucose-Capillary: 137 mg/dL — ABNORMAL HIGH (ref 70–99)

## 2013-06-23 MED ORDER — CLOPIDOGREL BISULFATE 75 MG PO TABS: 75.0000 mg | ORAL_TABLET | Freq: Every day | ORAL | Status: DC

## 2013-06-23 MED ORDER — LOSARTAN POTASSIUM 25 MG PO TABS
25.0000 mg | ORAL_TABLET | Freq: Every day | ORAL | Status: DC
Start: 2013-06-23 — End: 2013-06-23
  Filled 2013-06-23: qty 1

## 2013-06-23 NOTE — Progress Notes (Addendum)
Subjective:  No further arm weakness. Laying flat in bed comfortably. No chest pain or dyspnea on rest.  Objective:  Vital Signs in the last 24 hours: Temp:  [97.6 F (36.4 C)-98.1 F (36.7 C)] 98 F (36.7 C) (01/13 0803) Pulse Rate:  [71-94] 93 (01/13 0803) Resp:  [16-18] 18 (01/13 0803) BP: (119-142)/(54-74) 133/68 mmHg (01/13 0803) SpO2:  [84 %-97 %] 97 % (01/13 0803)  Intake/Output from previous day: 01/12 0701 - 01/13 0700 In: 240 [P.O.:240] Out: -   Physical Exam: General: Well built and Morbidly obese body habitus who is in no acute distress. Appears stated age. Alert Ox3.  There is no cyanosis. HEENT: normal limits. PERRLA, No JVD.  CARDIAC EXAM: S1, S2 normal, no gallop present. No murmur.  CHEST EXAM: No tenderness of chest wall. LUNGS: Clear to percuss and auscultate.  ABDOMEN: V. obese. Pannus present. No obvious hepatosplenomegaly. BS normal in all 4 quadrants. Abdomen is non-tender.  EXTREMITY: No clubbing or cyanosis, Minimal edema on legs. No calf tenderness, no cords palpable, Homan's sign- Negative.  NEUROLOGIC EXAM: Grossly intact without any focal deficits. Alert O x 3.  VASCULAR EXAM: No skin breakdown. Carotids normal. Extremities: Femoral pulse distant and feeble without bruit. Popliteal pulse feeble ; Pedal pulse normal. Otherwise No obvious prominent pulse felt in the abdomen. No varicose veins. Lab Results:  Recent Labs  06/20/13 1833  WBC 8.7  HGB 12.1  PLT 318    Recent Labs  06/20/13 1833 06/22/13 0346  NA 138 139  K 4.4 4.0  CL 100 103  CO2 22 23  GLUCOSE 127* 115*  BUN 29* 19  CREATININE 1.36* 1.07    Recent Labs  06/20/13 1833  TROPONINI <0.30   Hepatic Function Panel  Recent Labs  06/20/13 1833  PROT 7.3  ALBUMIN 4.0  AST 23  ALT 18  ALKPHOS 51  BILITOT 0.2*    Recent Labs  06/21/13 0410  CHOL 145     Lipid Panel     Component Value Date/Time   CHOL 145 06/21/2013 0410   TRIG 175* 06/21/2013 0410   HDL 45  06/21/2013 0410   CHOLHDL 3.2 06/21/2013 0410   VLDL 35 06/21/2013 0410   LDLCALC 65 06/21/2013 0410    Scheduled Meds: . clopidogrel  75 mg Oral Q breakfast  . DULoxetine  60 mg Oral Daily  . enoxaparin (LOVENOX) injection  40 mg Subcutaneous Q24H  . estrogens (conjugated)  0.625 mg Oral Daily  . furosemide  20 mg Oral Once  . hydrochlorothiazide  25 mg Oral Daily  . latanoprost  1 drop Right Eye QHS  . medroxyPROGESTERone  5 mg Oral Daily  . metoprolol succinate  25 mg Oral Daily  . multivitamin with minerals  1 tablet Oral Daily  . pantoprazole  40 mg Oral Daily  . spironolactone  25 mg Oral Daily  . temazepam  30 mg Oral QHS  . Tofacitinib Citrate  5 mg Oral BID   Continuous Infusions:  PRN Meds:.acetaminophen, alum & mag hydroxide-simeth, ondansetron (ZOFRAN) IV   Assessment/Plan:  Please see my consult note for complete assessment and plan in detail.  1. Chronic diastolic heart failure, patient takes Lasix on a when necessary basis, would recommend the same without any change. 2. Shortness of breath and dyspnea on exertion due to multifactorial etiology including diastolic dysfunction, morbid obesity contributing to obesity hypoventilation and worsening the diastolic dysfunction, deconditioning due to comorbidities including severe backache, patient states that she's unable  to do any kind of activity even for 5 minutes without having to sit down. 3. Spinal stenosis and chronic backache 4. Morbid obesity with alveolar hypoventilation.  5. Coronary atherosclerosis of native coronary artery  EKG 08/27/2012 SR @ 91/min with left atrial abnormality. Low voltage complexes. NO ischemia. Otherwise normal EKG.  6. CAD S/P CABG 1986 ([place]) records not available  Lexiscan Stress: 02/16/11: Non diagnostic EKG. Very small apical ischemia vs gut artifact. Normal EF. Low risk stress.  Echo 1/12: normal echocardiogram. Poor echo window. Echo 06/22/2013: Hyperdynamic LV. Mod. RV dilatation.  No significant valvular abnormality. Poor echo window.  7. Type II or unspecified type diabetes mellitus without mention of complication, uncontrolled (250.02)  8. Pure hypercholesterolemia  9. History of TIA (transient ischemic attack) and stroke  Admitted in [place], [place] in January 2012 with TIA. She developed numbness in the left side of her face and also left arm. MRI Head 06/28/10 No Stroke. Now admitted with left arm weakness and has had two episodes in the hospital and last episode two days ago.   Recommendation: From cardiac standpoint no further inpatient cardiac workup is indicated at this time. I had a long and lengthy discussion with the patient regarding weight loss and probably joining water aerobics. Consideration for neurosurgical consultation for evaluation of back pain is also indicated. Patient has already been treated conservatively by Dr. Vira Blanco without any relief.  And Cozaar 25 mg by mouth daily for CHF and diastolic dysfunction. She will follow up with me in the outpatient basis and will also set her up for a stress test in the outpatient basis. Change on discharge losartan HCT as patient is also on hydrochlorothiazide. Outpatient BMP in about 10 days.     Laverda Page, M.D. 06/23/2013, 9:17 AM [facility] Pager: [phone] Office: [phone] If no answer: [phone]

## 2013-06-23 NOTE — Progress Notes (Signed)
Stroke Team Progress Note  HISTORY [name] is a 68 y.o. female who was seated in a chair yesterday 06/20/2013 at home when around 4 PM when she noticed that her left arm felt tingly. She went to raise her left arm and found it to be weak and difficult to control. According to the patient it was flopping around. She tried to reach behind her and pickup the phone but she could not hold the phone in her hand. Her fingers seem to contract. The episode lasted about 3 minutes. She immediately took 4 baby aspirin and had her husband bring her to the emergency department and she was admitted.   She reports that 5 years ago she had a similar episode which was diagnosed as a TIA. This occurred in [facility] She was seen at the hospital there and had an MRI which apparently was unrevealing. She has been taking aspirin 81 mg daily ever since. That episode also lasted approximately 3 minutes.  Today 06/21/2013, while in the hospital, at approximately 10 or 11 AM the patient had a third such episode which was witnessed by her husband. The nurse was called; however, the episode had essentially resolved by the time the nurse arrived. The patient was changed to Plavix and a neurology consult was requested. An MRI has also been ordered.   Patient was not a TPA candidate secondary to symptom resolution.   SUBJECTIVE Patient lying in the bed. No complaints this am.   OBJECTIVE Most recent Vital Signs: Filed Vitals:   06/22/13 2050 06/23/13 0134 06/23/13 0504 06/23/13 0803  BP: 132/74 142/73 125/70 133/68  Pulse: 87 87 87 93  Temp: 98.1 F (36.7 C) 97.8 F (36.6 C) 97.9 F (36.6 C) 98 F (36.7 C)  TempSrc: Oral Oral Oral Axillary  Resp: 18 16 18 18   Height:      Weight:      SpO2: 92% 97% 96% 97%   CBG (last 3)   Recent Labs  06/22/13 1715 06/22/13 2150 06/23/13 0609  GLUCAP 115* 118* 108*    IV Fluid Intake:      MEDICATIONS  . clopidogrel  75 mg Oral Q breakfast  .  DULoxetine  60 mg Oral Daily  . enoxaparin (LOVENOX) injection  40 mg Subcutaneous Q24H  . estrogens (conjugated)  0.625 mg Oral Daily  . furosemide  20 mg Oral Once  . hydrochlorothiazide  25 mg Oral Daily  . latanoprost  1 drop Right Eye QHS  . losartan  25 mg Oral Daily  . medroxyPROGESTERone  5 mg Oral Daily  . metoprolol succinate  25 mg Oral Daily  . multivitamin with minerals  1 tablet Oral Daily  . pantoprazole  40 mg Oral Daily  . spironolactone  25 mg Oral Daily  . temazepam  30 mg Oral QHS  . Tofacitinib Citrate  5 mg Oral BID   PRN:  acetaminophen, alum & mag hydroxide-simeth, ondansetron (ZOFRAN) IV  Diet:  Cardiac thin liquids Activity:   Bathroom privileges with assistance DVT Prophylaxis:  Lovenox 40 mg sq daily   CLINICALLY SIGNIFICANT STUDIES Basic Metabolic Panel:   Recent Labs Lab 06/20/13 1833 06/22/13 0346  NA 138 139  K 4.4 4.0  CL 100 103  CO2 22 23  GLUCOSE 127* 115*  BUN 29* 19  CREATININE 1.36* 1.07  CALCIUM 9.4 8.5   Liver Function Tests:   Recent Labs Lab 06/20/13 1833  AST 23  ALT 18  ALKPHOS 51  BILITOT 0.2*  PROT 7.3  ALBUMIN 4.0   CBC:   Recent Labs Lab 06/20/13 1833  WBC 8.7  NEUTROABS 5.9  HGB 12.1  HCT 36.1  MCV 97.0  PLT 318   Coagulation:   Recent Labs Lab 06/20/13 1833  LABPROT 12.1  INR 0.91   Cardiac Enzymes:   Recent Labs Lab 06/20/13 1833  TROPONINI <0.30   Urinalysis:   Recent Labs Lab 06/20/13 1832  COLORURINE YELLOW  LABSPEC 1.020  PHURINE 5.5  GLUCOSEU NEGATIVE  HGBUR NEGATIVE  BILIRUBINUR NEGATIVE  KETONESUR NEGATIVE  PROTEINUR NEGATIVE  UROBILINOGEN 0.2  NITRITE NEGATIVE  LEUKOCYTESUR TRACE*   Lipid Panel    Component Value Date/Time   CHOL 145 06/21/2013 0410   TRIG 175* 06/21/2013 0410   HDL 45 06/21/2013 0410   CHOLHDL 3.2 06/21/2013 0410   VLDL 35 06/21/2013 0410   LDLCALC 65 06/21/2013 0410   HgbA1C  Lab Results  Component Value Date   HGBA1C 6.7* 06/21/2013     Urine Drug Screen:     Component Value Date/Time   LABOPIA NONE DETECTED 06/20/2013 1830   COCAINSCRNUR NONE DETECTED 06/20/2013 1830   LABBENZ NONE DETECTED 06/20/2013 1830   AMPHETMU NONE DETECTED 06/20/2013 1830   THCU NONE DETECTED 06/20/2013 1830   LABBARB NONE DETECTED 06/20/2013 1830    Alcohol Level:   Recent Labs Lab 06/20/13 1833  ETH <11     CT of the brain  06/20/2013    Transient ischemic attack, numbness.   Mild diffuse cortical atrophy. No acute intracranial abnormality seen.   MRI of the brain  06/22/2013    No acute infarct.   MRA of the brain  06/22/2013    Intracranial atherosclerotic type changes   2D Echocardiogram  EF 65-70% with no source of embolus.   Carotid Doppler  No evidence of hemodynamically significant internal carotid artery stenosis. Vertebral artery flow is antegrade.   EKG  Normal sinus rhythm. Low voltage QRS. Nonspecific T wave abnormality   EEG  normal EEG recording during wakefulness. No evidence of an epileptic disorder was demonstrated  Therapy Recommendations   Physical Exam   Middle aged obese lady not in distress.Awake alert. Afebrile. Head is nontraumatic. Neck is supple without bruit. Hearing is normal. Cardiac exam no murmur or gallop. Lungs are clear to auscultation. Distal pulses are well felt. Neurological Exam ;  Awake  Alert oriented x 3. Normal speech and language.eye movements full without nystagmus.fundi were not visualized. Vision acuity and fields appear normal. Hearing is normal. Palatal movements are normal. Face symmetric. Tongue midline. Normal strength, tone, reflexes and coordination. Normal sensation. Gait deferred.  ASSESSMENT [name] is a 68 y.o. female presenting with left upper extremity weakness and numbness. Imaging confirms no acute infarct. Diagnosis: Right brain TIA. TIA etiology unclear at this time. On aspirin 81 mg orally every day prior to admission. Now on clopidogrel 75 mg orally every  day for secondary stroke prevention. Patient with resultant resolved neurologic deficits. Work up completed.    Hypertension  Obstructive sleep apnea, uses CPAP at home (unable to tolerate mask here)  Diabetes, hemoglobin A1c 6.7  History of stroke 2011, [place]  Coronary artery disease-CABG 1986  Alcohol use Hyperlipidemia, LDL 65, on Pravachol 40 PTA, now on no statin, goal LDL < 100 (< 70 for diabetics) Morbid obesity, Body mass index is 44.75 kg/(m^2).   Hospital day # 3  TREATMENT/PLAN  Continue clopidogrel 75 mg orally every day  for secondary stroke prevention.  Resume statin at discharge - takes pravachol at home, has rash allergy to zocor, our substitute  Ongoing risk factor control  No further stroke workup indicated. Patient has a 10-15% risk of having another stroke over the next year, the highest risk is within 2 weeks of the most recent stroke/TIA (risk of having a stroke following a stroke or TIA is the same). Ongoing risk factor control by Primary Care Physician Stroke Service will sign off. Please call should any needs arise. Follow up with Dr. Leonie Man, [facility], in 2 months.   Burnetta Sabin, MSN, RN, ANVP-BC, ANP-BC, [facility] Pager: [phone] 06/23/2013 10:04 AM  I have personally obtained a history, examined the patient, evaluated imaging results, and formulated the assessment and plan of care. I agree with the above.  Antony Contras, MD

## 2013-06-23 NOTE — Discharge Instructions (Signed)
Follow with Primary MD Tivis Ringer, MD in 7 days   Get CBC, CMP, checked 7 days by Primary MD and again as instructed by your Primary MD. Get a 2 view Chest X ray done next visit if you had Pneumonia of Lung problems at the Hospital.   Activity: As tolerated with Full fall precautions use walker/cane & assistance as needed   Disposition Home     Diet:  Heart Healthy Low carb  Accuchecks 4 times/day, Once in AM empty stomach and then before each meal. Log in all results and show them to your Prim.MD in 3 days. If any glucose reading is under 80 or above 300 call your Prim MD immidiately. Follow Low glucose instructions for glucose under 80 as instructed.   For Heart failure patients - Check your Weight same time everyday, if you gain over 2 pounds, or you develop in leg swelling, experience more shortness of breath or chest pain, call your Primary MD immediately. Follow Cardiac Low Salt Diet and 1.8 lit/day fluid restriction.   On your next visit with her primary care physician please Get Medicines reviewed and adjusted.  Please request your Prim.MD to go over all Hospital Tests and Procedure/Radiological results at the follow up, please get all Hospital records sent to your Prim MD by signing hospital release before you go home.   If you experience worsening of your admission symptoms, develop shortness of breath, life threatening emergency, suicidal or homicidal thoughts you must seek medical attention immediately by calling 911 or calling your MD immediately  if symptoms less severe.  You Must read complete instructions/literature along with all the possible adverse reactions/side effects for all the Medicines you take and that have been prescribed to you. Take any new Medicines after you have completely understood and accpet all the possible adverse reactions/side effects.   Do not drive and provide baby sitting services if your were admitted for syncope or siezures until you  have seen by Primary MD or a Neurologist and advised to do so again.  Do not drive when taking Pain medications.    Do not take more than prescribed Pain, Sleep and Anxiety Medications  Special Instructions: If you have smoked or chewed Tobacco  in the last 2 yrs please stop smoking, stop any regular Alcohol  and or any Recreational drug use.  Wear Seat belts while driving.   Please note  You were cared for by a hospitalist during your hospital stay. If you have any questions about your discharge medications or the care you received while you were in the hospital after you are discharged, you can call the unit and asked to speak with the hospitalist on call if the hospitalist that took care of you is not available. Once you are discharged, your primary care physician will handle any further medical issues. Please note that NO REFILLS for any discharge medications will be authorized once you are discharged, as it is imperative that you return to your primary care physician (or establish a relationship with a primary care physician if you do not have one) for your aftercare needs so that they can reassess your need for medications and monitor your lab values.

## 2013-06-23 NOTE — Progress Notes (Signed)
Patient is discharged from room 4N07 at this time. Alert and in stable condition. IV site d/c'd as well as tele. Instructions read to patient and understanding verbalized. All questions answered appropriately. Husband at side and took all belongings. Left unit via wheelchair.

## 2013-06-23 NOTE — Discharge Summary (Signed)
[facility]                                                                                   [name], is a 68 y.o. female  DOB [birth year]  MRN [id number].  Admission date:  06/20/2013  Admitting Physician  Kinnie Feil, MD  Discharge Date:  06/23/2013   Primary MD  Tivis Ringer, MD  Recommendations for primary care physician for things to follow:   Kindly ensure patient follows with recommended subspecialists in a rapid timeframe   Admission Diagnosis  TIA (transient ischemic attack) [435.9] HTN (hypertension) [401.9] Chronic pain [338.29] DM (diabetes mellitus) [250.00] Left arm weakness [729.89]  Discharge Diagnosis   TIA  Active Problems:   TIA (transient ischemic attack)   Left arm weakness   DM (diabetes mellitus)   HTN (hypertension)   Chronic pain      Past Medical History  Diagnosis Date  . Hypertension   . Dysrhythmia   . Sleep apnea     sleep study  oct 2012  . Anginal pain     sees Dr Einar Gip.   . Diabetes mellitus without complication     no meds -diet controlled  . Stroke 2011    [place]    . Peripheral vascular disease     legs  . GERD (gastroesophageal reflux disease)   . Arthritis     rheumatoid ..   . Fibromyalgia   . Liver disease 08- 2012    per Dr Dagmar Hait pt has enlarged liver    Past Surgical History  Procedure Laterality Date  . Coronary artery bypass graft  1986  . Cardiac catheterization  1996  1986  . Eye surgery  2013    cat ext bilateral .  . Eye surgery  2002    laser  . Fracture surgery  -1988- 2007    rod right leg - right wrist plate  . Cholecystectomy  1996  . Pars plana vitrectomy  03/25/2012    Procedure: PARS PLANA VITRECTOMY WITH 25 GAUGE;  Surgeon: Hayden Pedro, MD;  Location: [place];  Service: Ophthalmology;  Laterality: Right;  . Gas/fluid exchange  03/25/2012    Procedure: GAS/FLUID EXCHANGE;  Surgeon: Hayden Pedro, MD;  Location: [place] [facility];  Service: Ophthalmology;  Laterality: Right;      Discharge Condition: Stable   Follow-up Information   Follow up with Tivis Ringer, MD. Schedule an appointment as soon as possible for a visit in 1 week.   Specialty:  Internal Medicine   Contact information:   [facility] [place] [address] [phone]       Follow up with [facility], MD. Schedule an appointment as soon as possible for a visit in 1 week.   Specialty:  Pulmonary Disease   Contact information:   [place] [address] [phone]       Follow up with Laverda Page, MD. Schedule an appointment as soon as possible for a visit in 1 week.   Specialty:  Cardiology   Contact information:   [address] [address] [phone]  Follow up with Forbes Cellar, MD. Schedule an appointment as soon as possible for a visit in 1 week.   Specialties:  Neurology, Radiology   Contact information:   [address] [place] [address] [phone]         [place], [facility]   Discharge Medications      Medication List    STOP taking these medications       aspirin EC 81 MG tablet      TAKE these medications       clopidogrel 75 MG tablet  Commonly known as:  PLAVIX  Take 1 tablet (75 mg total) by mouth daily with breakfast.     esomeprazole 40 MG capsule  Commonly known as:  NEXIUM  Take 40 mg by mouth 2 (two) times daily.     estrogen (conjugated)-medroxyprogesterone 0.625-5 MG per tablet  Commonly known as:  PREMPRO  Take 1 tablet by mouth daily.     HYDROcodone-acetaminophen 7.5-325 MG per tablet  Commonly known as:  NORCO  Take 1 tablet by mouth daily as needed for moderate pain.     metoprolol succinate 25 MG 24 hr tablet  Commonly known as:  TOPROL-XL  Take 25 mg by mouth daily.     OXYGEN-HELIUM IN  Inhale into the lungs as needed.     pravastatin 40 MG tablet  Commonly known as:  PRAVACHOL   Take 40 mg by mouth at bedtime.     spironolactone 25 MG tablet  Commonly known as:  ALDACTONE  Take 25 mg by mouth at bedtime.     temazepam 30 MG capsule  Commonly known as:  RESTORIL  Take 30 mg by mouth at bedtime.     TRIBENZOR 40-5-25 MG Tabs  Generic drug:  Olmesartan-Amlodipine-HCTZ  Take 1 tablet by mouth daily.     XELJANZ 5 MG Tabs  Generic drug:  Tofacitinib Citrate  Take 5 mg by mouth 2 (two) times daily.     ZIOPTAN 0.0015 % Soln  Generic drug:  Tafluprost  Place 1 drop into both eyes at bedtime.         Diet and Activity recommendation: See Discharge Instructions below   Discharge Instructions     Follow with Primary MD Tivis Ringer, MD in 7 days   Get CBC, CMP, checked 7 days by Primary MD and again as instructed by your Primary MD. Get a 2 view Chest X ray done next visit if you had Pneumonia of Lung problems at the Hospital.   Activity: As tolerated with Full fall precautions use walker/cane & assistance as needed   Disposition Home     Diet:  Heart Healthy Low carb  Accuchecks 4 times/day, Once in AM empty stomach and then before each meal. Log in all results and show them to your Prim.MD in 3 days. If any glucose reading is under 80 or above 300 call your Prim MD immidiately. Follow Low glucose instructions for glucose under 80 as instructed.   For Heart failure patients - Check your Weight same time everyday, if you gain over 2 pounds, or you develop in leg swelling, experience more shortness of breath or chest pain, call your Primary MD immediately. Follow Cardiac Low Salt Diet and 1.8 lit/day fluid restriction.   On your next visit with her primary care physician please Get Medicines reviewed and adjusted.  Please request your Prim.MD to go over all Hospital Tests and Procedure/Radiological results at the follow up, please  get all Hospital records sent to your Prim MD by signing hospital release before you go home.   If you  experience worsening of your admission symptoms, develop shortness of breath, life threatening emergency, suicidal or homicidal thoughts you must seek medical attention immediately by calling 911 or calling your MD immediately  if symptoms less severe.  You Must read complete instructions/literature along with all the possible adverse reactions/side effects for all the Medicines you take and that have been prescribed to you. Take any new Medicines after you have completely understood and accpet all the possible adverse reactions/side effects.   Do not drive and provide baby sitting services if your were admitted for syncope or siezures until you have seen by Primary MD or a Neurologist and advised to do so again.  Do not drive when taking Pain medications.    Do not take more than prescribed Pain, Sleep and Anxiety Medications  Special Instructions: If you have smoked or chewed Tobacco  in the last 2 yrs please stop smoking, stop any regular Alcohol  and or any Recreational drug use.  Wear Seat belts while driving.   Please note  You were cared for by a hospitalist during your hospital stay. If you have any questions about your discharge medications or the care you received while you were in the hospital after you are discharged, you can call the unit and asked to speak with the hospitalist on call if the hospitalist that took care of you is not available. Once you are discharged, your primary care physician will handle any further medical issues. Please note that NO REFILLS for any discharge medications will be authorized once you are discharged, as it is imperative that you return to your primary care physician (or establish a relationship with a primary care physician if you do not have one) for your aftercare needs so that they can reassess your need for medications and monitor your lab values.    Major procedures and Radiology Reports - PLEASE review detailed and final reports for all  details, in brief -     EEG - This is a normal EEG recording during wakefulness. No evidence of an epileptic disorder was demonstrated.    Carotid - Bilateral: 1-39% ICA stenosis. Vertebral artery flow is antegrade.    Echo - Left ventricle: The cavity size was normal. There was moderate concentric hypertrophy. Systolic function was vigorous. The estimated ejection fraction was in the range of 65% to 70%. Wall motion was normal; there were no regional wall motion abnormalities. Left ventricular diastolic function parameters were normal. - Mitral valve: Calcified annulus. - Left atrium: The atrium was mildly to moderately dilated. - Right ventricle: The cavity size was moderately dilated. - Right atrium: The atrium was mildly dilated.       Ct Head Wo Contrast  06/20/2013   CLINICAL DATA:  Transient ischemic attack, numbness.  EXAM: CT HEAD WITHOUT CONTRAST  TECHNIQUE: Contiguous axial images were obtained from the base of the skull through the vertex without intravenous contrast.  COMPARISON:  CT scan of October 06, 2007.  FINDINGS: Bony calvarium appears intact. Mild diffuse cortical atrophy is noted. No mass effect or midline shift is noted. Ventricular size is within normal limits. There is no evidence of mass lesion, hemorrhage or acute infarction.  IMPRESSION: Mild diffuse cortical atrophy. No acute intracranial abnormality seen.   Electronically Signed   By: Roque Lias M.D.   On: 06/20/2013 18:29   Ct Angio Chest Pe  W/cm &/or Wo Cm  06/22/2013   CLINICAL DATA:  Shortness breath for the past 18 months worsening over the past 3 months. Hypertensive diabetic hyperlipidemia patient.  EXAM: CT ANGIOGRAPHY CHEST WITH CONTRAST  TECHNIQUE: Multidetector CT imaging of the chest was performed using the standard protocol during bolus administration of intravenous contrast. Multiplanar CT image reconstructions including MIPs were obtained to evaluate the vascular anatomy.  CONTRAST:  152mL  OMNIPAQUE IOHEXOL 350 MG/ML SOLN  COMPARISON:  None.  FINDINGS: Within the limitations of respiratory motion artifact and artifact caused by the patient's habitus, no pulmonary embolus is noted.  Mosaic pulmonary parenchymal changes throughout both lungs. Considerations include changes secondary to asymmetric pulmonary edema, chronic changes, pneumonitis whether related to inflammatory process or infectious process or combination of the above.  Slight nodularity lung parenchyma in the most notable anterior right middle lobe measuring up to 6.3 mm (7 series 7, image 56). If the patient is at high risk for bronchogenic carcinoma, follow-up chest CT at 6-12 months is recommended. If the patient is at low risk for bronchogenic carcinoma, follow-up chest CT at 12 months is recommended. This recommendation follows the consensus statement: Guidelines for Management of Small Pulmonary Nodules Detected on CT Scans: A Statement from the [employer] as published in Radiology 2005;237:395-400.  Scattered calcified granuloma consistent with prior granulomatous exposure.  No bony destructive lesion.  Post CABG.  Cardiomegaly.  Coronary artery calcifications.  Atherosclerotic type changes of the thoracic aorta without focal aneurysm or evidence of dissection.  No mediastinal or hilar are adenopathy.  Patulous esophagus.  Fatty liver. Post cholecystectomy. Radiopaque structure left upper abdomen of questionable etiology. No free intraperitoneal air.  Review of the MIP images confirms the above findings.  IMPRESSION: Within the limitations of respiratory motion artifact and artifact caused by the patient's habitus, no pulmonary embolus is noted.  Mosaic pulmonary parenchymal changes throughout both lungs. Considerations include changes secondary to asymmetric pulmonary edema, chronic changes, pneumonitis whether related to inflammatory process or infectious process or combination of the above.  Slight nodularity lung  parenchyma in the most notable anterior right middle lobe measuring up to 6.3 mm (7 series 7, image 56). If the patient is at high risk for bronchogenic carcinoma, follow-up chest CT at 6-12 months is recommended. If the patient is at low risk for bronchogenic carcinoma, follow-up chest CT at 12 months is recommended. This recommendation follows the consensus statement: Guidelines for Management of Small Pulmonary Nodules Detected on CT Scans: A Statement from the [employer] as published in Radiology 2005;237:395-400.  Post CABG.  Cardiomegaly.   Electronically Signed   By: Chauncey Cruel M.D.   On: 06/22/2013 13:59   Mr Jodene Nam Head Wo Contrast  06/22/2013   CLINICAL DATA:  Left arm weakness.  EXAM: MRI HEAD WITHOUT CONTRAST  MRA HEAD WITHOUT CONTRAST  TECHNIQUE: Multiplanar, multiecho pulse sequences of the brain and surrounding structures were obtained without intravenous contrast. Angiographic images of the head were obtained using MRA technique without contrast.  COMPARISON:  06/20/2013 CT.  10/22/2007 MR.  FINDINGS: MRI HEAD FINDINGS  No acute infarct.  Minimal small vessel disease type changes.  No intracranial hemorrhage.  No intracranial mass lesion noted on this unenhanced exam.  Mild atrophy without hydrocephalus.  Top-normal size pituitary gland. Cervical medullary junction, pineal region and orbital structures unremarkable.  Minimal to mild paranasal sinus mucosal thickening most notable ethmoid sinus air cells.  MRA HEAD FINDINGS  Mild narrowing and ectasia cavernous segment left internal  carotid artery.  Mild to moderate narrowing proximal M1 segment right middle cerebral artery.  Mild narrowing A1 segment anterior cerebral artery bilaterally.  Middle cerebral artery mild branch vessel irregularity bilaterally.  Left vertebral artery is dominant. Mild ectasia and irregularity of the left vertebral artery.  Mild to moderate narrowing distal right vertebral artery.  Mild to moderate narrowing  proximal basilar artery.  Mild irregularity of the posterior inferior cerebellar artery bilaterally.  Poor delineation of the anterior inferior cerebellar artery bilaterally.  Mild irregularity and narrowing of portions of the superior cerebellar artery and posterior cerebral arteries bilaterally.  No aneurysm noted.  IMPRESSION: No acute infarct.  Intracranial atherosclerotic type changes as detailed above.  Please see above.   Electronically Signed   By: Chauncey Cruel M.D.   On: 06/22/2013 08:56   Mri Brain Without Contrast  06/22/2013   CLINICAL DATA:  Left arm weakness.  EXAM: MRI HEAD WITHOUT CONTRAST  MRA HEAD WITHOUT CONTRAST  TECHNIQUE: Multiplanar, multiecho pulse sequences of the brain and surrounding structures were obtained without intravenous contrast. Angiographic images of the head were obtained using MRA technique without contrast.  COMPARISON:  06/20/2013 CT.  10/22/2007 MR.  FINDINGS: MRI HEAD FINDINGS  No acute infarct.  Minimal small vessel disease type changes.  No intracranial hemorrhage.  No intracranial mass lesion noted on this unenhanced exam.  Mild atrophy without hydrocephalus.  Top-normal size pituitary gland. Cervical medullary junction, pineal region and orbital structures unremarkable.  Minimal to mild paranasal sinus mucosal thickening most notable ethmoid sinus air cells.  MRA HEAD FINDINGS  Mild narrowing and ectasia cavernous segment left internal carotid artery.  Mild to moderate narrowing proximal M1 segment right middle cerebral artery.  Mild narrowing A1 segment anterior cerebral artery bilaterally.  Middle cerebral artery mild branch vessel irregularity bilaterally.  Left vertebral artery is dominant. Mild ectasia and irregularity of the left vertebral artery.  Mild to moderate narrowing distal right vertebral artery.  Mild to moderate narrowing proximal basilar artery.  Mild irregularity of the posterior inferior cerebellar artery bilaterally.  Poor delineation of the  anterior inferior cerebellar artery bilaterally.  Mild irregularity and narrowing of portions of the superior cerebellar artery and posterior cerebral arteries bilaterally.  No aneurysm noted.  IMPRESSION: No acute infarct.  Intracranial atherosclerotic type changes as detailed above.  Please see above.   Electronically Signed   By: Chauncey Cruel M.D.   On: 06/22/2013 08:56    Micro Results      No results found for this or any previous visit (from the past 240 hour(s)).   History of present illness and  Hospital Course:     Kindly see H&P for history of present illness and admission details, please review complete Labs, Consult reports and Test reports for all details in brief [name], is a 68 y.o. female, patient with history of  diabetes mellitus type 2, hypertension, possible TIA in the past, DJD, obstructive sleep apnea, chronic pain, [facility] New[facility] with intermittent left arm tingling and numbness which happened for a few minutes x2.   Was seen by neurology, underwent a thorough stroke workup which was essentially unremarkable including CT scan of the brain, MRI MRA of the brain, echo gram, carotid duplex and EEG.    She also provided history of exertional shortness of breath for the last several months, for which she scheduled to have an outpatient stress test with her cardiologist Dr. Einar Gip, due to this history she underwent  CT angiogram of the chest which showed a lung nodule and some chronic lung findings suggestive more of inflammation , he not consistent with acute infection. I have requested her to follow with the pulmonologist in the outpatient setting in addition to her personal cardiologist.    She is now symptom-free. Most likely her left arm tingling numbness was secondary to TIA, she was on aspirin and will be placed on Plavix thereafter, she will follow with her primary care physician post discharge along with a neurologist one time. We'll request PCP  to kindly monitor her glycemic control, A1c, blood pressure and lipids for secondary prevention in this factor modulation. Her other home medications for her chronic medical issues will remain unchanged .      Today   Subjective:   [name] today has no headache,no chest abdominal pain,no new weakness tingling or numbness, feels much better wants to go home today.   Objective:   Blood pressure 133/68, pulse 93, temperature 98 F (36.7 C), temperature source Axillary, resp. rate 18, height 5\' 5"  (1.651 m), weight 121.972 kg (268 lb 14.4 oz), SpO2 97.00%.  No intake or output data in the 24 hours ending 06/23/13 0935  Exam Awake Alert, Oriented *3, No new F.N deficits, Normal affect [place].AT,PERRAL Supple Neck,No JVD, No cervical lymphadenopathy appriciated.  Symmetrical Chest wall movement, Good air movement bilaterally, CTAB RRR,No Gallops,Rubs or new Murmurs, No Parasternal Heave +ve B.Sounds, Abd Soft, Non tender, No organomegaly appriciated, No rebound -guarding or rigidity. No Cyanosis, Clubbing or edema, No new Rash or bruise  Data Review   Lab Results  Component Value Date   HGBA1C 6.7* 06/21/2013    Lab Results  Component Value Date   CHOL 145 06/21/2013   HDL 45 06/21/2013   LDLCALC 65 06/21/2013   TRIG 175* 06/21/2013   CHOLHDL 3.2 06/21/2013    CBC w Diff:  Lab Results  Component Value Date   WBC 8.7 06/20/2013   HGB 12.1 06/20/2013   HCT 36.1 06/20/2013   PLT 318 06/20/2013   LYMPHOPCT 17 06/20/2013   MONOPCT 13* 06/20/2013   EOSPCT 2 06/20/2013   BASOPCT 1 06/20/2013    CMP:  Lab Results  Component Value Date   NA 139 06/22/2013   K 4.0 06/22/2013   CL 103 06/22/2013   CO2 23 06/22/2013   BUN 19 06/22/2013   CREATININE 1.07 06/22/2013   PROT 7.3 06/20/2013   ALBUMIN 4.0 06/20/2013   BILITOT 0.2* 06/20/2013   ALKPHOS 51 06/20/2013   AST 23 06/20/2013   ALT 18 06/20/2013  .   Total Time in preparing paper work, data evaluation and todays exam - 35  minutes  Thurnell Lose M.D on 06/23/2013 at 9:35 AM  [place]  [phone]

## 2013-06-29 ENCOUNTER — Telehealth: Payer: Self-pay | Admitting: Internal Medicine

## 2013-06-29 DIAGNOSIS — E119 Type 2 diabetes mellitus without complications: Secondary | ICD-10-CM | POA: Diagnosis not present

## 2013-06-29 DIAGNOSIS — I10 Essential (primary) hypertension: Secondary | ICD-10-CM | POA: Diagnosis not present

## 2013-06-29 DIAGNOSIS — R0609 Other forms of dyspnea: Secondary | ICD-10-CM | POA: Diagnosis not present

## 2013-06-29 NOTE — Telephone Encounter (Signed)
Pt is a new patient, never seen in office.  Was seen by Pulmonary in hospital.  D/C on TUE 06/22/13--advised to followed with MR within 1 week of D/C  Please advise Anderson [place] where patient can be scheduled as MR schedule is booked up. Thanks.

## 2013-06-30 NOTE — Telephone Encounter (Signed)
Called and spoke with pt. She confirmed she did not see any of our docs in the hospital. She is scheduled to come in and see MW tomorrow at 12 PM. She is aware to arrive 15 min early and to bring her medications with her. Also aware of the address. Nothing further needed

## 2013-06-30 NOTE — Telephone Encounter (Signed)
Follow up with RAMASWAMY,MURALI, MD. Schedule an appointment as soon as possible for a visit in 1 week.  Patient returning call.

## 2013-06-30 NOTE — Telephone Encounter (Signed)
Pt did NOT see pulmonary in hospital so will need consult with doc. MR first available is not until 07-29-13. Is pt ok with another MD? [id number] with the pt husband.

## 2013-07-01 ENCOUNTER — Encounter (INDEPENDENT_AMBULATORY_CARE_PROVIDER_SITE_OTHER): Payer: Self-pay

## 2013-07-01 ENCOUNTER — Encounter: Payer: Self-pay | Admitting: Internal Medicine

## 2013-07-01 ENCOUNTER — Ambulatory Visit (INDEPENDENT_AMBULATORY_CARE_PROVIDER_SITE_OTHER): Payer: Medicare Other | Admitting: Internal Medicine

## 2013-07-01 VITALS — BP 110/68 | HR 86 | Temp 97.9°F | Ht 65.0 in | Wt 274.0 lb

## 2013-07-01 DIAGNOSIS — R918 Other nonspecific abnormal finding of lung field: Secondary | ICD-10-CM

## 2013-07-01 DIAGNOSIS — R0609 Other forms of dyspnea: Secondary | ICD-10-CM

## 2013-07-01 DIAGNOSIS — R0989 Other specified symptoms and signs involving the circulatory and respiratory systems: Secondary | ICD-10-CM

## 2013-07-01 DIAGNOSIS — R0600 Dyspnea, unspecified: Secondary | ICD-10-CM

## 2013-07-01 NOTE — Patient Instructions (Signed)
Incentive spirometer at least four times daily   Please schedule a follow up visit in 3 months but call sooner if needed with cxr

## 2013-07-01 NOTE — Progress Notes (Signed)
   Subjective:    Patient ID: [name], female    DOB: 1945-12-27    MRN: [id number]  HPI  27 yowf  With RA/ on cpap for MO quit smoking 1986  [employer] wife travel all over including [place] and [place] on the [place] with new doe x 2014 and ct a showing multiple nodules referred 07/01/2013 to pulmonary clinic  07/01/2013 1st  Pulmonary office visit/ Alex Mcmanigal cc new onset doe x 1 year on 02 x 2 months. Onset was insidious, min progressive, limited to slow pace flat on 02   No obvious day to day or daytime variabilty or assoc chronic cough or cp or chest tightness, subjective wheeze overt sinus or hb symptoms. No unusual exp hx or h/o childhood pna/ asthma or knowledge of premature birth.  Sleeping ok without nocturnal  or early am exacerbation  of respiratory  c/o's or need for noct saba. Also denies any obvious fluctuation of symptoms with weather or environmental changes or other aggravating or alleviating factors except as outlined above   Current Medications, Allergies, Complete Past Medical History, Past Surgical History, Family History, and Social History were reviewed in [employer] record.   y.        Review of Systems  Constitutional: Negative for fever, chills and unexpected weight change.  HENT: Positive for sneezing. Negative for congestion, dental problem, ear pain, nosebleeds, postnasal drip, rhinorrhea, sinus pressure, sore throat, trouble swallowing and voice change.   Eyes: Negative for visual disturbance.  Respiratory: Positive for shortness of breath. Negative for cough and choking.   Cardiovascular: Negative for chest pain and leg swelling.  Gastrointestinal: Negative for vomiting, abdominal pain and diarrhea.  Genitourinary: Negative for difficulty urinating.       Indigestion  Musculoskeletal: Negative for arthralgias.  Skin: Negative for rash.  Neurological: Negative for tremors, syncope and headaches.  Hematological: Does not  bruise/bleed easily.       Objective:   Physical Exam   Obese wf nad at rest RA  Wt Readings from Last 3 Encounters:  07/01/13 274 lb (124.286 kg)  06/20/13 268 lb 14.4 oz (121.972 kg)  06/06/13 270 lb (122.471 kg)        HEENT: nl dentition, turbinates, and orophanx. Nl external ear canals without cough reflex   NECK :  without JVD/Nodes/TM/ nl carotid upstrokes bilaterally   LUNGS: no acc muscle use, clear to A and P bilaterally without cough on insp or exp maneuvers   CV:  RRR  no s3 or murmur or increase in P2, no edema   ABD:  soft and nontender with nl excursion in the supine position. No bruits or organomegaly, bowel sounds nl  MS:  warm without deformities, calf tenderness, cyanosis or clubbing  SKIN: warm and dry without lesions    NEURO:  alert, approp, no deficits         Assessment & Plan:

## 2013-07-03 DIAGNOSIS — I5030 Unspecified diastolic (congestive) heart failure: Secondary | ICD-10-CM | POA: Diagnosis not present

## 2013-07-03 DIAGNOSIS — R0609 Other forms of dyspnea: Secondary | ICD-10-CM | POA: Diagnosis not present

## 2013-07-03 DIAGNOSIS — R079 Chest pain, unspecified: Secondary | ICD-10-CM | POA: Diagnosis not present

## 2013-07-03 DIAGNOSIS — R918 Other nonspecific abnormal finding of lung field: Secondary | ICD-10-CM | POA: Insufficient documentation

## 2013-07-03 NOTE — Assessment & Plan Note (Signed)
Although there are clearly abnormalities on CT scan, they should probably be considered "microscopic" since not obvious on plain cxr .     In the setting of obvious "macroscopic" health issues,  I am very reluctatnt to embark on an invasive w/u at this point but will arrange consevative  follow up and in the meantime see what we can do to address the patient's subjective concerns.    She is now 1 dep resp failure and very debilitated from RA/ obesity > best option for now is Rx with IS to offset obesity and f/u conservatively with cxr's only  Discussed in detail all the  indications, usual  risks and alternatives  relative to the benefits with patient who agrees to proceed with conservative f/u as outlined.

## 2013-07-06 DIAGNOSIS — E669 Obesity, unspecified: Secondary | ICD-10-CM | POA: Diagnosis not present

## 2013-07-06 DIAGNOSIS — M479 Spondylosis, unspecified: Secondary | ICD-10-CM | POA: Diagnosis not present

## 2013-07-08 DIAGNOSIS — M47812 Spondylosis without myelopathy or radiculopathy, cervical region: Secondary | ICD-10-CM | POA: Diagnosis not present

## 2013-07-08 DIAGNOSIS — M5020 Other cervical disc displacement, unspecified cervical region: Secondary | ICD-10-CM | POA: Diagnosis not present

## 2013-07-08 DIAGNOSIS — M4802 Spinal stenosis, cervical region: Secondary | ICD-10-CM | POA: Diagnosis not present

## 2013-07-08 DIAGNOSIS — M479 Spondylosis, unspecified: Secondary | ICD-10-CM | POA: Diagnosis not present

## 2013-07-09 DIAGNOSIS — M5412 Radiculopathy, cervical region: Secondary | ICD-10-CM | POA: Diagnosis not present

## 2013-07-09 DIAGNOSIS — E669 Obesity, unspecified: Secondary | ICD-10-CM | POA: Diagnosis not present

## 2013-07-10 ENCOUNTER — Telehealth: Payer: Self-pay | Admitting: Neurology

## 2013-07-10 NOTE — Telephone Encounter (Signed)
I spoke to the patient and explained the risk of recurrent TIA/stroke being  maximum the first 3 months. Since she had a TIA on 06/22/13 recommend waiting 3 months at least before stopping Plavix. The patient is in agreement.

## 2013-07-10 NOTE — Telephone Encounter (Signed)
Patient is going to see a neurosurgeon to get an injection in her neck and he is wondering if she needs to come off of her Plavix medication. Please call patient and advise.

## 2013-07-13 ENCOUNTER — Ambulatory Visit: Payer: Medicare Other | Admitting: Gastroenterology

## 2013-07-13 DIAGNOSIS — J111 Influenza due to unidentified influenza virus with other respiratory manifestations: Secondary | ICD-10-CM | POA: Diagnosis not present

## 2013-07-13 DIAGNOSIS — R059 Cough, unspecified: Secondary | ICD-10-CM | POA: Diagnosis not present

## 2013-07-13 DIAGNOSIS — I10 Essential (primary) hypertension: Secondary | ICD-10-CM | POA: Diagnosis not present

## 2013-07-13 DIAGNOSIS — R5383 Other fatigue: Secondary | ICD-10-CM | POA: Diagnosis not present

## 2013-07-13 DIAGNOSIS — Z6841 Body Mass Index (BMI) 40.0 and over, adult: Secondary | ICD-10-CM | POA: Diagnosis not present

## 2013-07-13 DIAGNOSIS — E119 Type 2 diabetes mellitus without complications: Secondary | ICD-10-CM | POA: Diagnosis not present

## 2013-07-13 DIAGNOSIS — I25810 Atherosclerosis of coronary artery bypass graft(s) without angina pectoris: Secondary | ICD-10-CM | POA: Diagnosis not present

## 2013-07-13 DIAGNOSIS — R05 Cough: Secondary | ICD-10-CM | POA: Diagnosis not present

## 2013-07-13 DIAGNOSIS — J029 Acute pharyngitis, unspecified: Secondary | ICD-10-CM | POA: Diagnosis not present

## 2013-07-13 DIAGNOSIS — R5381 Other malaise: Secondary | ICD-10-CM | POA: Diagnosis not present

## 2013-07-15 ENCOUNTER — Telehealth: Payer: Self-pay | Admitting: Internal Medicine

## 2013-07-15 NOTE — Telephone Encounter (Signed)
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 They have been having problems with [facility] regarding dx for pt O2. I advised her of what MW documented in pt chart. Nothing further needed

## 2013-07-22 DIAGNOSIS — G459 Transient cerebral ischemic attack, unspecified: Secondary | ICD-10-CM | POA: Diagnosis not present

## 2013-07-22 DIAGNOSIS — R918 Other nonspecific abnormal finding of lung field: Secondary | ICD-10-CM | POA: Diagnosis not present

## 2013-07-22 DIAGNOSIS — R0609 Other forms of dyspnea: Secondary | ICD-10-CM | POA: Diagnosis not present

## 2013-07-22 DIAGNOSIS — E119 Type 2 diabetes mellitus without complications: Secondary | ICD-10-CM | POA: Diagnosis not present

## 2013-07-22 DIAGNOSIS — M069 Rheumatoid arthritis, unspecified: Secondary | ICD-10-CM | POA: Diagnosis not present

## 2013-07-22 DIAGNOSIS — E039 Hypothyroidism, unspecified: Secondary | ICD-10-CM | POA: Diagnosis not present

## 2013-07-22 DIAGNOSIS — I10 Essential (primary) hypertension: Secondary | ICD-10-CM | POA: Diagnosis not present

## 2013-07-22 DIAGNOSIS — R0902 Hypoxemia: Secondary | ICD-10-CM | POA: Diagnosis not present

## 2013-07-27 DIAGNOSIS — M542 Cervicalgia: Secondary | ICD-10-CM | POA: Diagnosis not present

## 2013-07-27 DIAGNOSIS — M6281 Muscle weakness (generalized): Secondary | ICD-10-CM | POA: Diagnosis not present

## 2013-07-27 DIAGNOSIS — M47814 Spondylosis without myelopathy or radiculopathy, thoracic region: Secondary | ICD-10-CM | POA: Diagnosis not present

## 2013-07-30 DIAGNOSIS — I10 Essential (primary) hypertension: Secondary | ICD-10-CM | POA: Diagnosis not present

## 2013-07-30 DIAGNOSIS — I25810 Atherosclerosis of coronary artery bypass graft(s) without angina pectoris: Secondary | ICD-10-CM | POA: Diagnosis not present

## 2013-07-30 DIAGNOSIS — R0602 Shortness of breath: Secondary | ICD-10-CM | POA: Diagnosis not present

## 2013-07-30 DIAGNOSIS — M069 Rheumatoid arthritis, unspecified: Secondary | ICD-10-CM | POA: Diagnosis not present

## 2013-07-30 DIAGNOSIS — J984 Other disorders of lung: Secondary | ICD-10-CM | POA: Diagnosis not present

## 2013-07-30 DIAGNOSIS — K219 Gastro-esophageal reflux disease without esophagitis: Secondary | ICD-10-CM | POA: Diagnosis not present

## 2013-07-30 DIAGNOSIS — E039 Hypothyroidism, unspecified: Secondary | ICD-10-CM | POA: Diagnosis not present

## 2013-07-30 DIAGNOSIS — Z1331 Encounter for screening for depression: Secondary | ICD-10-CM | POA: Diagnosis not present

## 2013-07-30 DIAGNOSIS — J9610 Chronic respiratory failure, unspecified whether with hypoxia or hypercapnia: Secondary | ICD-10-CM | POA: Diagnosis not present

## 2013-08-07 ENCOUNTER — Emergency Department (HOSPITAL_COMMUNITY): Payer: Medicare Other

## 2013-08-07 ENCOUNTER — Encounter (HOSPITAL_COMMUNITY): Payer: Self-pay | Admitting: Emergency Medicine

## 2013-08-07 ENCOUNTER — Emergency Department (HOSPITAL_COMMUNITY)
Admission: EM | Admit: 2013-08-07 | Discharge: 2013-08-07 | Disposition: A | Discharge status: Home / Self Care | Payer: Medicare Other | Attending: Emergency Medicine | Admitting: Emergency Medicine

## 2013-08-07 DIAGNOSIS — D649 Anemia, unspecified: Secondary | ICD-10-CM | POA: Insufficient documentation

## 2013-08-07 DIAGNOSIS — M2669 Other specified disorders of temporomandibular joint: Secondary | ICD-10-CM | POA: Insufficient documentation

## 2013-08-07 DIAGNOSIS — I10 Essential (primary) hypertension: Secondary | ICD-10-CM | POA: Diagnosis not present

## 2013-08-07 DIAGNOSIS — K219 Gastro-esophageal reflux disease without esophagitis: Secondary | ICD-10-CM | POA: Diagnosis not present

## 2013-08-07 DIAGNOSIS — M069 Rheumatoid arthritis, unspecified: Secondary | ICD-10-CM | POA: Diagnosis not present

## 2013-08-07 DIAGNOSIS — M79609 Pain in unspecified limb: Secondary | ICD-10-CM | POA: Insufficient documentation

## 2013-08-07 DIAGNOSIS — Z7902 Long term (current) use of antithrombotics/antiplatelets: Secondary | ICD-10-CM | POA: Insufficient documentation

## 2013-08-07 DIAGNOSIS — Z880 Allergy status to penicillin: Secondary | ICD-10-CM | POA: Insufficient documentation

## 2013-08-07 DIAGNOSIS — Z862 Personal history of diseases of the blood and blood-forming organs and certain disorders involving the immune mechanism: Secondary | ICD-10-CM | POA: Diagnosis not present

## 2013-08-07 DIAGNOSIS — Z8639 Personal history of other endocrine, nutritional and metabolic disease: Secondary | ICD-10-CM | POA: Insufficient documentation

## 2013-08-07 DIAGNOSIS — Z79899 Other long term (current) drug therapy: Secondary | ICD-10-CM | POA: Diagnosis not present

## 2013-08-07 DIAGNOSIS — I209 Angina pectoris, unspecified: Secondary | ICD-10-CM | POA: Diagnosis not present

## 2013-08-07 DIAGNOSIS — Z8673 Personal history of transient ischemic attack (TIA), and cerebral infarction without residual deficits: Secondary | ICD-10-CM | POA: Insufficient documentation

## 2013-08-07 DIAGNOSIS — M26609 Unspecified temporomandibular joint disorder, unspecified side: Secondary | ICD-10-CM | POA: Diagnosis not present

## 2013-08-07 DIAGNOSIS — Z951 Presence of aortocoronary bypass graft: Secondary | ICD-10-CM | POA: Insufficient documentation

## 2013-08-07 DIAGNOSIS — E119 Type 2 diabetes mellitus without complications: Secondary | ICD-10-CM | POA: Insufficient documentation

## 2013-08-07 DIAGNOSIS — Z87891 Personal history of nicotine dependence: Secondary | ICD-10-CM | POA: Insufficient documentation

## 2013-08-07 DIAGNOSIS — M26629 Arthralgia of temporomandibular joint, unspecified side: Secondary | ICD-10-CM

## 2013-08-07 DIAGNOSIS — Z9889 Other specified postprocedural states: Secondary | ICD-10-CM | POA: Diagnosis not present

## 2013-08-07 DIAGNOSIS — IMO0001 Reserved for inherently not codable concepts without codable children: Secondary | ICD-10-CM | POA: Diagnosis not present

## 2013-08-07 DIAGNOSIS — G4730 Sleep apnea, unspecified: Secondary | ICD-10-CM | POA: Diagnosis not present

## 2013-08-07 DIAGNOSIS — M79602 Pain in left arm: Secondary | ICD-10-CM

## 2013-08-07 LAB — CBC WITH DIFFERENTIAL/PLATELET
Basophils Absolute: 0.1 10*3/uL (ref 0.0–0.1)
Basophils Relative: 1 % (ref 0–1)
EOS ABS: 0.2 10*3/uL (ref 0.0–0.7)
Eosinophils Relative: 2 % (ref 0–5)
HEMATOCRIT: 34.3 % — AB (ref 36.0–46.0)
Hemoglobin: 11.5 g/dL — ABNORMAL LOW (ref 12.0–15.0)
Lymphocytes Relative: 24 % (ref 12–46)
Lymphs Abs: 2.3 10*3/uL (ref 0.7–4.0)
MCH: 32.2 pg (ref 26.0–34.0)
MCHC: 33.5 g/dL (ref 30.0–36.0)
MCV: 96.1 fL (ref 78.0–100.0)
MONO ABS: 0.9 10*3/uL (ref 0.1–1.0)
MONOS PCT: 9 % (ref 3–12)
Neutro Abs: 6.4 10*3/uL (ref 1.7–7.7)
Neutrophils Relative %: 65 % (ref 43–77)
Platelets: 330 10*3/uL (ref 150–400)
RBC: 3.57 MIL/uL — ABNORMAL LOW (ref 3.87–5.11)
RDW: 14 % (ref 11.5–15.5)
WBC: 9.9 10*3/uL (ref 4.0–10.5)

## 2013-08-07 LAB — COMPREHENSIVE METABOLIC PANEL
ALT: 11 U/L (ref 0–35)
AST: 20 U/L (ref 0–37)
Albumin: 3.7 g/dL (ref 3.5–5.2)
Alkaline Phosphatase: 42 U/L (ref 39–117)
BUN: 27 mg/dL — AB (ref 6–23)
CO2: 21 mEq/L (ref 19–32)
Calcium: 8.9 mg/dL (ref 8.4–10.5)
Chloride: 103 mEq/L (ref 96–112)
Creatinine, Ser: 1.13 mg/dL — ABNORMAL HIGH (ref 0.50–1.10)
GFR calc Af Amer: 57 mL/min — ABNORMAL LOW (ref >90)
GFR calc non Af Amer: 49 mL/min — ABNORMAL LOW (ref >90)
GLUCOSE: 145 mg/dL — AB (ref 70–99)
Potassium: 4.2 mEq/L (ref 3.7–5.3)
Sodium: 140 mEq/L (ref 137–147)
TOTAL PROTEIN: 7.1 g/dL (ref 6.0–8.3)
Total Bilirubin: 0.3 mg/dL (ref 0.3–1.2)

## 2013-08-07 LAB — TROPONIN I
Troponin I: <0.3 ng/mL (ref <0.30)
Troponin I: <0.3 ng/mL (ref <0.30)

## 2013-08-07 LAB — CK: CK TOTAL: 94 U/L (ref 7–177)

## 2013-08-07 LAB — I-STAT CG4 LACTIC ACID, ED: LACTIC ACID, VENOUS: 0.83 mmol/L (ref 0.5–2.2)

## 2013-08-07 MED ORDER — KETOROLAC TROMETHAMINE 30 MG/ML IJ SOLN
30.0000 mg | Freq: Once | INTRAMUSCULAR | Status: AC
Start: 2013-08-07 — End: 2013-08-07
  Administered 2013-08-07: 30 mg via INTRAVENOUS
  Filled 2013-08-07: qty 1

## 2013-08-07 MED ORDER — NITROGLYCERIN 0.4 MG SL SUBL
0.4000 mg | SUBLINGUAL_TABLET | SUBLINGUAL | Status: DC | PRN
  Administered 2013-08-07: 0.4 mg via SUBLINGUAL
  Filled 2013-08-07: qty 25

## 2013-08-07 MED ORDER — DOCUSATE SODIUM 100 MG PO CAPS: 100.0000 mg | ORAL_CAPSULE | Freq: Two times a day (BID) | ORAL | Status: DC

## 2013-08-07 MED ORDER — ASPIRIN 81 MG PO CHEW
324.0000 mg | CHEWABLE_TABLET | Freq: Once | ORAL | Status: AC
  Administered 2013-08-07: 324 mg via ORAL
  Filled 2013-08-07: qty 4

## 2013-08-07 NOTE — ED Notes (Signed)
Pt. Reports that the pain in her jaw "feels like somebody knocked me in the jaw" but denies injury. Pt. Reports pain with movement of jaw. Pt. Reports that left arm pain began overnight. Pt. Reports taking pain medication with no relief. Pt. Denies injury to left arm. Pt. Reports that pain does not increase with arm movement and is not tender to touch. Pt. Denies chest pain, dizziness, n/v/d.   Pt. Placed on 2L of oxygen for comfort. Pt. Wears 2L PRN at home.

## 2013-08-07 NOTE — ED Provider Notes (Signed)
CSN: [id number]     Arrival date & time 08/07/13  0454 History   First MD Initiated Contact with Patient 08/07/13 0503     Chief Complaint  Patient presents with  . Jaw Pain  . Arm Pain     (Consider location/radiation/quality/duration/timing/severity/associated sxs/prior Treatment) Patient is a 68 y.o. female presenting with arm pain. The history is provided by the patient.  Arm Pain  He had onset yesterday afternoon of a dull, achy pain in the right side of her jaw and her left upper arm. Pain is constant and severe and she rates it at 6/10. Jaw pain is worse with speaking, but arm pain is not affected by anything. She tried taking hydrocodone-acetaminophen with no relief. She applied Voltaren gel with no benefit. She denies dyspnea, nausea, diaphoresis. She denies fever or chills. She denies cough. She denies any recent injury your unusual activity. Onset was at rest.  Past Medical History  Diagnosis Date  . Hypertension   . Dysrhythmia   . Sleep apnea     sleep study  oct 2012  . Anginal pain     sees Dr Einar Gip.   . Diabetes mellitus without complication     no meds -diet controlled  . Stroke 2011    [place]    . Peripheral vascular disease     legs  . GERD (gastroesophageal reflux disease)   . Arthritis     rheumatoid ..   . Fibromyalgia   . Liver disease 08- 2012    per Dr Dagmar Hait pt has enlarged liver  . Thyroid disease    Past Surgical History  Procedure Laterality Date  . Coronary artery bypass graft  1986  . Cardiac catheterization  1996  1986  . Eye surgery  2013    cat ext bilateral .  . Eye surgery  2002    laser  . Fracture surgery  -1988- 2007    rod right leg - right wrist plate  . Cholecystectomy  1996  . Pars plana vitrectomy  03/25/2012    Procedure: PARS PLANA VITRECTOMY WITH 25 GAUGE;  Surgeon: Hayden Pedro, MD;  Location: [place];  Service: Ophthalmology;  Laterality: Right;  . Gas/fluid exchange  03/25/2012    Procedure: GAS/FLUID  EXCHANGE;  Surgeon: Hayden Pedro, MD;  Location: [place];  Service: Ophthalmology;  Laterality: Right;   Family History  Problem Relation Age of Onset  . Hypertension Daughter   . Rheum arthritis Maternal Grandmother    History  Substance Use Topics  . Smoking status: Former Smoker -- 0.25 packs/day for 5 years    Types: Cigarettes    Quit date: 06/11/1984  . Smokeless tobacco: Never Used  . Alcohol Use: 0.0 oz/week     Comment: occ   OB History   Grav Para Term Preterm Abortions TAB SAB Ect Mult Living                 Review of Systems  All other systems reviewed and are negative.      Allergies  Fentanyl; Lactase; Butrans; Penicillins; and Simvastatin  Home Medications   Current Outpatient Rx  Name  Route  Sig  Dispense  Refill  . clopidogrel (PLAVIX) 75 MG tablet   Oral   Take 1 tablet (75 mg total) by mouth daily with breakfast.   30 tablet   0   . esomeprazole (NEXIUM) 40 MG capsule   Oral   Take 40 mg by mouth 2 (two)  times daily.         Marland Kitchen estrogen, conjugated,-medroxyprogesterone (PREMPRO) 0.625-5 MG per tablet   Oral   Take 1 tablet by mouth daily.         Marland Kitchen HYDROcodone-acetaminophen (NORCO) 7.5-325 MG per tablet   Oral   Take 1 tablet by mouth daily as needed for moderate pain.         . metoprolol succinate (TOPROL-XL) 25 MG 24 hr tablet   Oral   Take 25 mg by mouth daily.         . Olmesartan-Amlodipine-HCTZ (TRIBENZOR) 40-5-25 MG TABS   Oral   Take 1 tablet by mouth daily.         Donell Sievert [place]      DME- [facility] o2 2 lpm as needed         . pravastatin (PRAVACHOL) 40 MG tablet   Oral   Take 40 mg by mouth at bedtime.         Marland Kitchen spironolactone (ALDACTONE) 25 MG tablet   Oral   Take 25 mg by mouth at bedtime.         . Tafluprost (ZIOPTAN) 0.0015 % SOLN   Both Eyes   Place 1 drop into both eyes at bedtime.         . temazepam (RESTORIL) 30 MG capsule   Oral   Take 30 mg by mouth at bedtime.         .  Tofacitinib Citrate (XELJANZ) 5 MG TABS   Oral   Take 5 mg by mouth 2 (two) times daily.          BP 127/61  Pulse 79  Temp(Src) 97.8 F (36.6 C) (Oral)  Resp 16  SpO2 97% Physical Exam  Nursing note and vitals reviewed.  68 year old female, resting comfortably and in no acute distress. Vital signs are normal. Oxygen saturation is 97%, which is normal. Head is normocephalic and atraumatic. PERRLA, EOMI. Oropharynx is clear. Neck is nontender and supple without adenopathy or JVD. Back is nontender and there is no CVA tenderness. Lungs are clear without rales, wheezes, or rhonchi. Chest is nontender. Heart has regular rate and rhythm without murmur. Abdomen is soft, flat, nontender without masses or hepatosplenomegaly and peristalsis is normoactive. Extremities have trace edema, full range of motion is present. Arms and legs are diffusely tender, which she states is normal for her with her fibromyalgia. Skin is warm and dry without rash. Neurologic: Mental status is normal, cranial nerves are intact, there are no motor or sensory deficits.  ED Course  Procedures (including critical care time) Labs Review Results for orders placed during the hospital encounter of 08/07/13  CBC WITH DIFFERENTIAL      Result Value Ref Range   WBC 9.9  4.0 - 10.5 K/uL   RBC 3.57 (*) 3.87 - 5.11 MIL/uL   Hemoglobin 11.5 (*) 12.0 - 15.0 g/dL   HCT 34.3 (*) 36.0 - 46.0 %   MCV 96.1  78.0 - 100.0 fL   MCH 32.2  26.0 - 34.0 pg   MCHC 33.5  30.0 - 36.0 g/dL   RDW 14.0  11.5 - 15.5 %   Platelets 330  150 - 400 K/uL   Neutrophils Relative % 65  43 - 77 %   Neutro Abs 6.4  1.7 - 7.7 K/uL   Lymphocytes Relative 24  12 - 46 %   Lymphs Abs 2.3  0.7 - 4.0 K/uL   Monocytes Relative 9  3 - 12 %   Monocytes Absolute 0.9  0.1 - 1.0 K/uL   Eosinophils Relative 2  0 - 5 %   Eosinophils Absolute 0.2  0.0 - 0.7 K/uL   Basophils Relative 1  0 - 1 %   Basophils Absolute 0.1  0.0 - 0.1 K/uL  COMPREHENSIVE  METABOLIC PANEL      Result Value Ref Range   Sodium 140  137 - 147 mEq/L   Potassium 4.2  3.7 - 5.3 mEq/L   Chloride 103  96 - 112 mEq/L   CO2 21  19 - 32 mEq/L   Glucose, Bld 145 (*) 70 - 99 mg/dL   BUN 27 (*) 6 - 23 mg/dL   Creatinine, Ser 1.13 (*) 0.50 - 1.10 mg/dL   Calcium 8.9  8.4 - 10.5 mg/dL   Total Protein 7.1  6.0 - 8.3 g/dL   Albumin 3.7  3.5 - 5.2 g/dL   AST 20  0 - 37 U/L   ALT 11  0 - 35 U/L   Alkaline Phosphatase 42  39 - 117 U/L   Total Bilirubin 0.3  0.3 - 1.2 mg/dL   GFR calc non Af Amer 49 (*) >90 mL/min   GFR calc Af Amer 57 (*) >90 mL/min  CK      Result Value Ref Range   Total CK 94  7 - 177 U/L  TROPONIN I      Result Value Ref Range   Troponin I <0.30  <0.30 ng/mL  I-STAT CG4 LACTIC ACID, ED      Result Value Ref Range   Lactic Acid, Venous 0.83  0.5 - 2.2 mmol/L   EKG Interpretation    Date/Time:  Friday August 07 2013 05:05:34 EST Ventricular Rate:  81 PR Interval:  174 QRS Duration: 84 QT Interval:  386 QTC Calculation: 448 R Axis:   94 Text Interpretation:  Normal sinus rhythm Rightward axis Low voltage QRS Nonspecific ST abnormality Abnormal ECG When compared with ECG of 20-Jun-2013 18:26, No significant change was found Confirmed by [facility]  MD, Nyellie Yetter ([id number]) on 08/07/2013 5:12:57 AM            MDM   Final diagnoses:  Pain of left arm  TMJ tenderness  Anemia    Jaw and arm pain of uncertain cause. ECG is unchanged from previous. Screening labs will be obtained.  Initial workup is unremarkable. She is given a dose of ketorolac with significant but not complete relief of pain. She also be given nitroglycerin. She states that she had a normal stress test 3 weeks ago.  Arm pain is completely relieved with one nitroglycerin. Jaw pain has improved but still present. It is worse with opening and closing her jaw. On reexam, she is clearly tender over the right TMJ and pain is elicited with both opening and closing her jaw against  resistance. I feel that her jaw pain is clearly related to her TMJ. Plan is to admit her for serial troponins.  Delora Fuel, MD [id number]

## 2013-08-07 NOTE — ED Provider Notes (Signed)
9:09 AM  Care from Dr Roxanne Mins. Pt feels better. Troponin x 2 negative. ecg unchanged. Doubt ACS. Nuclear stress test 3 weeks ago normal. Point tenderness of right TMJ. Intraorally no lesions or changed noted. Upper and lower dentures. 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 Has vicodin at home. Recommended colace to take with home vicodin  Outpatient follow up with pcp  1. Pain of left arm   2. TMJ tenderness   3. Anemia    Ct Maxillofacial Wo Cm  08/07/2013   CLINICAL DATA:  68 year old female with right TMJ pain, right side mandible and upper extremity numbness. Initial encounter.  EXAM: CT MAXILLOFACIAL WITHOUT CONTRAST  TECHNIQUE: Multidetector CT imaging of the maxillofacial structures was performed. Multiplanar CT image reconstructions were also generated. A small metallic BB was placed on the right temple in order to reliably differentiate right from left.  COMPARISON:  Cervical spine MRI 07/08/2013. Brain MRI 06/22/2013. Head CT 06/20/2013 and earlier.  FINDINGS: Grossly stable visualized non contrast brain parenchyma. Extensive Calcified atherosclerosis at the skull base.  Stable and negative orbits soft tissues. Retropharyngeal course of both carotids with calcified plaque. Negative visualized parapharyngeal spaces, pharyngeal and laryngeal contours, sublingual space, submandibular glands, and parotid glands.  Paranasal sinuses and mastoids are clear.  Mild osseous degenerative changes at the right TMJ which otherwise appears normal. The patient is edentulous. Mandible intact. No acute osseous abnormality identified.  IMPRESSION: Mild osseous degenerative changes at the right TMJ. No acute findings identified in the face. Retropharyngeal carotids and moderately advanced calcified atherosclerosis.   Electronically Signed   By: Lars Pinks M.D.   On: 08/07/2013 08:53   Results for orders placed during the hospital encounter of 08/07/13  CBC WITH DIFFERENTIAL      Result Value Ref Range   WBC  9.9  4.0 - 10.5 K/uL   RBC 3.57 (*) 3.87 - 5.11 MIL/uL   Hemoglobin 11.5 (*) 12.0 - 15.0 g/dL   HCT 34.3 (*) 36.0 - 46.0 %   MCV 96.1  78.0 - 100.0 fL   MCH 32.2  26.0 - 34.0 pg   MCHC 33.5  30.0 - 36.0 g/dL   RDW 14.0  11.5 - 15.5 %   Platelets 330  150 - 400 K/uL   Neutrophils Relative % 65  43 - 77 %   Neutro Abs 6.4  1.7 - 7.7 K/uL   Lymphocytes Relative 24  12 - 46 %   Lymphs Abs 2.3  0.7 - 4.0 K/uL   Monocytes Relative 9  3 - 12 %   Monocytes Absolute 0.9  0.1 - 1.0 K/uL   Eosinophils Relative 2  0 - 5 %   Eosinophils Absolute 0.2  0.0 - 0.7 K/uL   Basophils Relative 1  0 - 1 %   Basophils Absolute 0.1  0.0 - 0.1 K/uL  COMPREHENSIVE METABOLIC PANEL      Result Value Ref Range   Sodium 140  137 - 147 mEq/L   Potassium 4.2  3.7 - 5.3 mEq/L   Chloride 103  96 - 112 mEq/L   CO2 21  19 - 32 mEq/L   Glucose, Bld 145 (*) 70 - 99 mg/dL   BUN 27 (*) 6 - 23 mg/dL   Creatinine, Ser 1.13 (*) 0.50 - 1.10 mg/dL   Calcium 8.9  8.4 - 10.5 mg/dL   Total Protein 7.1  6.0 - 8.3 g/dL   Albumin 3.7  3.5 - 5.2 g/dL   AST 20  0 - 37 U/L   ALT 11  0 - 35 U/L   Alkaline Phosphatase 42  39 - 117 U/L   Total Bilirubin 0.3  0.3 - 1.2 mg/dL   GFR calc non Af Amer 49 (*) >90 mL/min   GFR calc Af Amer 57 (*) >90 mL/min  CK      Result Value Ref Range   Total CK 94  7 - 177 U/L  TROPONIN I      Result Value Ref Range   Troponin I <0.30  <0.30 ng/mL  TROPONIN I      Result Value Ref Range   Troponin I <0.30  <0.30 ng/mL  I-STAT CG4 LACTIC ACID, ED      Result Value Ref Range   Lactic Acid, Venous 0.83  0.5 - 2.2 mmol/L       Hoy Morn, MD 08/07/13 [phone]

## 2013-08-07 NOTE — ED Notes (Addendum)
Pt. States constant right jaw pain starting last night. Pt. Reports left arm pain starting this morning. Left arm pain is constant and aching. Pt. Denies chest pain.

## 2013-08-26 DIAGNOSIS — R0602 Shortness of breath: Secondary | ICD-10-CM | POA: Diagnosis not present

## 2013-08-26 DIAGNOSIS — I2510 Atherosclerotic heart disease of native coronary artery without angina pectoris: Secondary | ICD-10-CM | POA: Diagnosis not present

## 2013-08-26 DIAGNOSIS — G4733 Obstructive sleep apnea (adult) (pediatric): Secondary | ICD-10-CM | POA: Diagnosis not present

## 2013-09-04 DIAGNOSIS — H409 Unspecified glaucoma: Secondary | ICD-10-CM | POA: Diagnosis not present

## 2013-09-04 DIAGNOSIS — H35039 Hypertensive retinopathy, unspecified eye: Secondary | ICD-10-CM | POA: Diagnosis not present

## 2013-09-04 DIAGNOSIS — H4011X0 Primary open-angle glaucoma, stage unspecified: Secondary | ICD-10-CM | POA: Diagnosis not present

## 2013-09-17 ENCOUNTER — Telehealth: Payer: Self-pay | Admitting: *Deleted

## 2013-09-17 NOTE — Telephone Encounter (Signed)
Message copied by Rosana Berger on Thu Sep 17, 2013  3:36 PM ------      Message from: Tanda Rockers      Created: Fri Jul 03, 2013 11:04 AM       Needs ov with cxr now ------

## 2013-09-17 NOTE — Telephone Encounter (Signed)
Spoke with the pt and scheduled ov with cxr 10/05/13 at 10:45 am

## 2013-09-23 ENCOUNTER — Encounter: Payer: Self-pay | Admitting: Neurology

## 2013-09-23 ENCOUNTER — Ambulatory Visit (INDEPENDENT_AMBULATORY_CARE_PROVIDER_SITE_OTHER): Payer: Medicare Other | Admitting: Neurology

## 2013-09-23 VITALS — BP 123/69 | HR 83 | Temp 97.2°F | Ht 66.0 in | Wt 280.0 lb

## 2013-09-23 DIAGNOSIS — E669 Obesity, unspecified: Secondary | ICD-10-CM | POA: Diagnosis not present

## 2013-09-23 DIAGNOSIS — G459 Transient cerebral ischemic attack, unspecified: Secondary | ICD-10-CM

## 2013-09-23 NOTE — Patient Instructions (Signed)
I had a long discussion the patient husband regarding TIAs, results of hospital evaluation, stroke risk factors and risk factor modification and answered questions. Continue Plavix for secondary stroke prevention and strict control of hypertension with blood pressure goal below 130/90, diabetes with hemoglobin A1c goal below 7 and lipids his LDL cholesterol goal below 70 mg percent. I also advised her to exercise and lose some weight. She has neck pain and wants neck surgery but wants to wait for another 3 months to come out of the maximum risk. period for recurrent TIAs. Return for followup in 6 months.  Stroke Prevention Some medical conditions and behaviors are associated with an increased chance of having a stroke. You may prevent a stroke by making healthy choices and managing medical conditions. HOW CAN I REDUCE MY RISK OF HAVING A STROKE?   Stay physically active. Get at least 30 minutes of activity on most or all days.  Do not smoke. It may also be helpful to avoid exposure to secondhand smoke.  Limit alcohol use. Moderate alcohol use is considered to be:  No more than 2 drinks per day for men.  No more than 1 drink per day for nonpregnant women.  Eat healthy foods. This involves  Eating 5 or more servings of fruits and vegetables a day.  Following a diet that addresses high blood pressure (hypertension), high cholesterol, diabetes, or obesity.  Manage your cholesterol levels.  A diet low in saturated fat, trans fat, and cholesterol and high in fiber may control cholesterol levels.  Take any prescribed medicines to control cholesterol as directed by your health care provider.  Manage your diabetes.  A controlled-carbohydrate, controlled-sugar diet is recommended to manage diabetes.  Take any prescribed medicines to control diabetes as directed by your health care provider.  Control your hypertension.  A low-salt (sodium), low-saturated fat, low-trans fat, and  low-cholesterol diet is recommended to manage hypertension.  Take any prescribed medicines to control hypertension as directed by your health care provider.  Maintain a healthy weight.  A reduced-calorie, low-sodium, low-saturated fat, low-trans fat, low-cholesterol diet is recommended to manage weight.  Stop drug abuse.  Avoid taking birth control pills.  Talk to your health care provider about the risks of taking birth control pills if you are over 17 years old, smoke, get migraines, or have ever had a blood clot.  Get evaluated for sleep disorders (sleep apnea).  Talk to your health care provider about getting a sleep evaluation if you snore a lot or have excessive sleepiness.  Take medicines as directed by your health care provider.  For some people, aspirin or blood thinners (anticoagulants) are helpful in reducing the risk of forming abnormal blood clots that can lead to stroke. If you have the irregular heart rhythm of atrial fibrillation, you should be on a blood thinner unless there is a good reason you cannot take them.  Understand all your medicine instructions.  Make sure that other other conditions (such as anemia or atherosclerosis) are addressed. SEEK IMMEDIATE MEDICAL CARE IF:   You have sudden weakness or numbness of the face, arm, or leg, especially on one side of the body.  Your face or eyelid droops to one side.  You have sudden confusion.  You have trouble speaking (aphasia) or understanding.  You have sudden trouble seeing in one or both eyes.  You have sudden trouble walking.  You have dizziness.  You have a loss of balance or coordination.  You have a sudden, severe  headache with no known cause.  You have new chest pain or an irregular heartbeat. Any of these symptoms may represent a serious problem that is an emergency. Do not wait to see if the symptoms will go away. Get medical help at once. Call your local emergency services  (911 in [place] Do  not drive yourself to the hospital. Document Released: 07/05/2004 Document Revised: 03/18/2013 Document Reviewed: 11/28/2012 [facility] Patient Information 2014 [place].

## 2013-09-23 NOTE — Progress Notes (Signed)
[facility] [address] [place]. [place] [address] [phone]       OFFICE FOLLOW-UP NOTE  [name] Date of Birth:  August 19, 1945 Medical Record Number:  [id number]   HPI: 32 year Caucasian lady seen today for first office followup visit following hospital consideration for TIA and 06/20/13. She presented with sudden onset of left hand weakness numbness and inability to hold objects as well as tingling sensation. And was flopping around. She could not reach behind her and hold the telephone her hand. These symptoms lasted only 3-4 minutes and resolved completely. She took 4 tablets of baby aspirin and came to the emergency room. She had a somewhat similar episode 5 years ago in [facility] and at that time evaluation including MRI 1 revealing. She had been taking aspirin 81 mg daily since then. Following this recent episode she was switched to Plavix. Rest of her hospital evaluation was fairly unremarkable including MRI scan of the brain which showed no acute infarct MRA showed no intracranial large vessel stenosis carotid ultrasound showed no extracranial stenosis lipid profile was normal hemoglobin A1c was borderline at 6.7. Urine drug screen was negative. Transthoracic echo showed normal ejection fraction without corrective source of embolism. EKG showed sinus rhythm and telemetry monitoring did not reveal any arrhythmia. Urine drug screen was normal. She states she's done well since discharge according to without bleeding, bruising or other side effects we did she is not been able to exercise a lot because of severe neck and back pain. She has in fact seen neurosurgeon who recommends next is she the patient is reluctant to hold Plavix for fear of recurrent TIA/stroke. She states her sugars have been well controlled with fasting sugars being in the 120s. Blood pressure is quite good and today it is 1236/69 office. She does use CPAP every night.  ROS:   14  system review of systems is positive for fatigue, shortness of breath, joint pain only and all other systems negative PMH:  Past Medical History  Diagnosis Date  . Hypertension   . Dysrhythmia   . Sleep apnea     sleep study  oct 2012  . Anginal pain     sees Dr Einar Gip.   . Diabetes mellitus without complication     no meds -diet controlled  . Stroke 2011    [place]    . Peripheral vascular disease     legs  . GERD (gastroesophageal reflux disease)   . Arthritis     rheumatoid ..   . Fibromyalgia   . Liver disease 08- 2012    per Dr Dagmar Hait pt has enlarged liver  . Thyroid disease     Social History:  History   Social History  . Marital Status: Married    Spouse Name: Herbie Baltimore    Number of Children: 2  . Years of Education: 12   Occupational History  . Retired    Social History Main Topics  . Smoking status: Former Smoker -- 0.25 packs/day for 5 years    Types: Cigarettes    Quit date: 06/11/1984  . Smokeless tobacco: Never Used  . Alcohol Use: 0.0 oz/week     Comment: occ  . Drug Use: No  . Sexual Activity: Yes   Other Topics Concern  . Not on file   Social History Narrative  . No narrative on file    Medications:   Current Outpatient Prescriptions on File Prior to Visit  Medication Sig  Dispense Refill  . clopidogrel (PLAVIX) 75 MG tablet Take 1 tablet (75 mg total) by mouth daily with breakfast.  30 tablet  0  . escitalopram (LEXAPRO) 10 MG tablet Take 10 mg by mouth daily.      Marland Kitchen esomeprazole (NEXIUM) 40 MG capsule Take 40 mg by mouth 2 (two) times daily.      Marland Kitchen estrogen, conjugated,-medroxyprogesterone (PREMPRO) 0.625-5 MG per tablet Take 1 tablet by mouth daily.      Marland Kitchen HYDROcodone-acetaminophen (NORCO) 7.5-325 MG per tablet Take 1 tablet by mouth daily as needed for moderate pain. Up to 4 times daily      . metoprolol succinate (TOPROL-XL) 25 MG 24 hr tablet Take 25 mg by mouth daily.      . Olmesartan-Amlodipine-HCTZ (TRIBENZOR) 40-5-25 MG TABS  Take 1 tablet by mouth daily.      Donell Sievert [facility] [employer] o2 2 lpm as needed      . pravastatin (PRAVACHOL) 40 MG tablet Take 40 mg by mouth at bedtime.      Marland Kitchen spironolactone (ALDACTONE) 25 MG tablet Take 25 mg by mouth at bedtime.      . Tafluprost (ZIOPTAN) 0.0015 % SOLN Place 1 drop into both eyes at bedtime.      . temazepam (RESTORIL) 30 MG capsule Take 30 mg by mouth at bedtime.      . Tofacitinib Citrate (XELJANZ) 5 MG TABS Take 5 mg by mouth 2 (two) times daily.      Marland Kitchen docusate sodium (COLACE) 100 MG capsule Take 1 capsule (100 mg total) by mouth every 12 (twelve) hours.  60 capsule  0  . levothyroxine (SYNTHROID, LEVOTHROID) 50 MCG tablet Take 50 mcg by mouth daily before breakfast.       No current facility-administered medications on file prior to visit.    Allergies:   Allergies  Allergen Reactions  . Fentanyl Shortness Of Breath  . Lactase   . Butrans [Buprenorphine] Rash and Other (See Comments)    Infected skin underneath application  . Penicillins Rash    Facial rash  . Simvastatin Rash    Physical Exam General: Obese middle-age Caucasian lady seated, in no evident distress Head: head normocephalic and atraumatic. Orohparynx benign Neck: supple with no carotid or supraclavicular bruits Cardiovascular: regular rate and rhythm, no murmurs Musculoskeletal: no deformity Skin:  no rash/petichiae Vascular:  Normal pulses all extremities Filed Vitals:   09/23/13 1505  BP: 123/69  Pulse: 83  Temp: 97.2 F (36.2 C)   Neurologic Exam Mental Status: Awake and fully alert. Oriented to place and time. Recent and remote memory intact. Attention span, concentration and fund of knowledge appropriate. Mood and affect appropriate.  Cranial Nerves: Fundoscopic exam reveals sharp disc margins. Pupils equal, briskly reactive to light. Extraocular movements full without nystagmus. Visual fields full to confrontation. Hearing intact. Facial sensation intact. Face, tongue,  palate moves normally and symmetrically.  Motor: Normal bulk and tone. Normal strength in all tested extremity muscles. Sensory.: intact to touch and pinprick and vibratory sensation.  Coordination: Rapid alternating movements normal in all extremities. Finger-to-nose and heel-to-shin performed accurately bilaterally. Gait and Station: Arises from chair without difficulty. Stance is normal. Gait demonstrates normal stride length and balance . Unable to heel, toe and tandem walk without difficulty.  Reflexes: 1+ and symmetric. Toes downgoing.   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    PLAN: I had a long discussion the patient husband regarding TIAs, results of hospital evaluation, stroke risk factors and risk factor modification and answered questions. Continue Plavix for secondary stroke prevention and strict control of hypertension with blood pressure goal below 130/90, diabetes with hemoglobin A1c goal below 7 and lipids his LDL cholesterol goal below 70 mg percent. I also advised her to exercise and lose some weight. She has neck pain and wants neck surgery but wants to wait for another 3 months to come out of the maximum risk. period for recurrent TIAs. Return for followup in 6 months.     Note: This document was prepared with digital dictation and possible smart phrase technology. Any transcriptional errors that result from this process are unintentional

## 2013-09-28 DIAGNOSIS — M069 Rheumatoid arthritis, unspecified: Secondary | ICD-10-CM | POA: Diagnosis not present

## 2013-09-28 DIAGNOSIS — IMO0001 Reserved for inherently not codable concepts without codable children: Secondary | ICD-10-CM | POA: Diagnosis not present

## 2013-09-28 DIAGNOSIS — M1990 Unspecified osteoarthritis, unspecified site: Secondary | ICD-10-CM | POA: Diagnosis not present

## 2013-10-05 ENCOUNTER — Ambulatory Visit: Payer: Medicare Other | Admitting: Internal Medicine

## 2013-10-12 DIAGNOSIS — N390 Urinary tract infection, site not specified: Secondary | ICD-10-CM | POA: Diagnosis not present

## 2013-10-12 DIAGNOSIS — M069 Rheumatoid arthritis, unspecified: Secondary | ICD-10-CM | POA: Diagnosis not present

## 2013-12-02 DIAGNOSIS — IMO0001 Reserved for inherently not codable concepts without codable children: Secondary | ICD-10-CM | POA: Diagnosis not present

## 2013-12-02 DIAGNOSIS — M069 Rheumatoid arthritis, unspecified: Secondary | ICD-10-CM | POA: Diagnosis not present

## 2013-12-02 DIAGNOSIS — M79609 Pain in unspecified limb: Secondary | ICD-10-CM | POA: Diagnosis not present

## 2013-12-02 DIAGNOSIS — M1990 Unspecified osteoarthritis, unspecified site: Secondary | ICD-10-CM | POA: Diagnosis not present

## 2013-12-04 DIAGNOSIS — Z8673 Personal history of transient ischemic attack (TIA), and cerebral infarction without residual deficits: Secondary | ICD-10-CM | POA: Diagnosis not present

## 2013-12-04 DIAGNOSIS — I2510 Atherosclerotic heart disease of native coronary artery without angina pectoris: Secondary | ICD-10-CM | POA: Diagnosis not present

## 2013-12-04 DIAGNOSIS — R002 Palpitations: Secondary | ICD-10-CM | POA: Diagnosis not present

## 2013-12-12 ENCOUNTER — Encounter (HOSPITAL_COMMUNITY): Payer: Self-pay | Admitting: Emergency Medicine

## 2013-12-12 ENCOUNTER — Emergency Department (HOSPITAL_COMMUNITY)
Admission: EM | Admit: 2013-12-12 | Discharge: 2013-12-12 | Disposition: A | Discharge status: Home / Self Care | Payer: Medicare Other | Attending: Emergency Medicine | Admitting: Emergency Medicine

## 2013-12-12 DIAGNOSIS — E079 Disorder of thyroid, unspecified: Secondary | ICD-10-CM | POA: Diagnosis not present

## 2013-12-12 DIAGNOSIS — K769 Liver disease, unspecified: Secondary | ICD-10-CM | POA: Diagnosis not present

## 2013-12-12 DIAGNOSIS — Y929 Unspecified place or not applicable: Secondary | ICD-10-CM | POA: Insufficient documentation

## 2013-12-12 DIAGNOSIS — I10 Essential (primary) hypertension: Secondary | ICD-10-CM | POA: Diagnosis not present

## 2013-12-12 DIAGNOSIS — S0083XA Contusion of other part of head, initial encounter: Secondary | ICD-10-CM | POA: Insufficient documentation

## 2013-12-12 DIAGNOSIS — S0003XA Contusion of scalp, initial encounter: Secondary | ICD-10-CM | POA: Diagnosis not present

## 2013-12-12 DIAGNOSIS — Z8673 Personal history of transient ischemic attack (TIA), and cerebral infarction without residual deficits: Secondary | ICD-10-CM | POA: Diagnosis not present

## 2013-12-12 DIAGNOSIS — Z87891 Personal history of nicotine dependence: Secondary | ICD-10-CM | POA: Insufficient documentation

## 2013-12-12 DIAGNOSIS — I499 Cardiac arrhythmia, unspecified: Secondary | ICD-10-CM | POA: Diagnosis not present

## 2013-12-12 DIAGNOSIS — Z79899 Other long term (current) drug therapy: Secondary | ICD-10-CM | POA: Insufficient documentation

## 2013-12-12 DIAGNOSIS — I209 Angina pectoris, unspecified: Secondary | ICD-10-CM | POA: Diagnosis not present

## 2013-12-12 DIAGNOSIS — M531 Cervicobrachial syndrome: Secondary | ICD-10-CM | POA: Insufficient documentation

## 2013-12-12 DIAGNOSIS — G4730 Sleep apnea, unspecified: Secondary | ICD-10-CM | POA: Insufficient documentation

## 2013-12-12 DIAGNOSIS — I739 Peripheral vascular disease, unspecified: Secondary | ICD-10-CM | POA: Insufficient documentation

## 2013-12-12 DIAGNOSIS — M5481 Occipital neuralgia: Secondary | ICD-10-CM

## 2013-12-12 DIAGNOSIS — S1093XA Contusion of unspecified part of neck, initial encounter: Secondary | ICD-10-CM

## 2013-12-12 DIAGNOSIS — Z7902 Long term (current) use of antithrombotics/antiplatelets: Secondary | ICD-10-CM | POA: Insufficient documentation

## 2013-12-12 DIAGNOSIS — IMO0001 Reserved for inherently not codable concepts without codable children: Secondary | ICD-10-CM | POA: Insufficient documentation

## 2013-12-12 DIAGNOSIS — M129 Arthropathy, unspecified: Secondary | ICD-10-CM | POA: Diagnosis not present

## 2013-12-12 DIAGNOSIS — Y939 Activity, unspecified: Secondary | ICD-10-CM | POA: Insufficient documentation

## 2013-12-12 DIAGNOSIS — K219 Gastro-esophageal reflux disease without esophagitis: Secondary | ICD-10-CM | POA: Insufficient documentation

## 2013-12-12 DIAGNOSIS — E119 Type 2 diabetes mellitus without complications: Secondary | ICD-10-CM | POA: Insufficient documentation

## 2013-12-12 MED ORDER — GABAPENTIN 100 MG PO CAPS
100.0000 mg | ORAL_CAPSULE | Freq: Three times a day (TID) | ORAL | Status: DC
Start: 2013-12-12 — End: 2014-03-24

## 2013-12-12 MED ORDER — BUPIVACAINE HCL (PF) 0.5 % IJ SOLN
10.0000 mL | Freq: Once | INTRAMUSCULAR | Status: DC
  Filled 2013-12-12: qty 30

## 2013-12-12 NOTE — Discharge Instructions (Signed)
Occipital Neuralgia Neuralgias are attacks of pain. Often sharp and shock like but can also be dull, burning, etc. They may be intermittent (comes and goes) or constant in nature. They may be brief attacks that last seconds to minutes and may come back for days to weeks. TYPES OF NEURALGIA  When these pains are located in the back of the head and neck they are called occipital neuralgias.  When the pain is located between ribs it is called intercostal neuralgia.  When the pain is located in the face it is called trigeminal neuralgia. This is the most common neuralgia. It causes sharp, shock like pain on one side of your face. The attacks of pain may come from injury or inflammation (irritation) to a nerve. Often the cause is unknown. The episodes of pain may be caused by light touch, movement, or even eating and sneezing. Usually these neuralgias occur after age 46. The neuralgias following shingles and trigeminal neuralgia are the most common. Although painful, these episodes do not threaten life and tend to lessen as we grow older. TREATMENT  There are many medications that may be helpful in the treatment of this disorder. Sometimes several medications may have to be tried before the right combination can be found for you. Some of these medications are:  Only take over-the-counter or prescription medications for pain, discomfort, or fever as directed by your caregiver.  Narcotic medications may be used to control the pain.  Antidepressants and medications used in epilepsy (seizure disorders) may be useful. LET YOUR CAREGIVER KNOW ABOUT:  If you do not obtain relief from medications.  Problems that are getting worse rather than better.  Troubling side effects that you think are coming from the medication. Do not be discouraged if you do not obtain instant relief from the medications or help given you. Your caregiver can help you get through these episodes of pain with some persistence  (continued trying) on your part also. Document Released: 05/22/2001 Document Revised: 08/20/2011 Document Reviewed: 05/20/2013 [facility] Patient Information 2015 [place], [place]. This information is not intended to replace advice given to you by your health care provider. Make sure you discuss any questions you have with your health care provider.

## 2013-12-12 NOTE — ED Provider Notes (Signed)
CSN: [id number]     Arrival date & time 12/12/13  1047 History  This chart was scribed for Virgel Manifold, MD by Martinique Peace, ED Scribe. The patient was seen in [facility]. The patient's care was started at 12:38 PM.    Chief Complaint  Patient presents with  . Headache      Patient is a 68 y.o. female presenting with headaches. The history is provided by the patient and the spouse. No language interpreter was used.  Headache Associated symptoms: no cough, no fever and no nausea    HPI Comments: [name] is a 68 y.o. female who presents to the Emergency Department complaining severe constant headache onset 2 or 3 days ago. Pt describes the pain as sharp that transitions into a dull ache and states that it radiates to other parts of her head intermittently. Pt describes that pain as being towards the lateral aspect of the occipital region of the right side of her head. She further goes on to relate the pain to the feeling of getting hit in the head with a hammer. Pt states that the pain is exacerbated with touching to that area. She reports that she did discover a tick bite on the crown aspect of her head but denies any recent falls or hitting her head on anything. She further denies any fever, cough, nausea, or vision changes.   Past Medical History  Diagnosis Date  . Hypertension   . Dysrhythmia   . Sleep apnea     sleep study  oct 2012  . Anginal pain     sees Dr Einar Gip.   . Diabetes mellitus without complication     no meds -diet controlled  . Stroke 2011    [place]    . Peripheral vascular disease     legs  . GERD (gastroesophageal reflux disease)   . Arthritis     rheumatoid ..   . Fibromyalgia   . Liver disease 08- 2012    per Dr Dagmar Hait pt has enlarged liver  . Thyroid disease    Past Surgical History  Procedure Laterality Date  . Coronary artery bypass graft  1986  . Cardiac catheterization  1996  1986  . Eye surgery  2013    cat ext bilateral .  .  Eye surgery  2002    laser  . Fracture surgery  -1988- 2007    rod right leg - right wrist plate  . Cholecystectomy  1996  . Pars plana vitrectomy  03/25/2012    Procedure: PARS PLANA VITRECTOMY WITH 25 GAUGE;  Surgeon: Hayden Pedro, MD;  Location: [facility];  Service: Ophthalmology;  Laterality: Right;  . Gas/fluid exchange  03/25/2012    Procedure: GAS/FLUID EXCHANGE;  Surgeon: Hayden Pedro, MD;  Location: [facility];  Service: Ophthalmology;  Laterality: Right;   Family History  Problem Relation Age of Onset  . Hypertension Daughter   . Rheum arthritis Maternal Grandmother    History  Substance Use Topics  . Smoking status: Former Smoker -- 0.25 packs/day for 5 years    Types: Cigarettes    Quit date: 06/11/1984  . Smokeless tobacco: Never Used  . Alcohol Use: 0.0 oz/week     Comment: occ   OB History   Grav Para Term Preterm Abortions TAB SAB Ect Mult Living                 Review of Systems  Constitutional: Negative for fever.  Eyes: Negative  for visual disturbance.  Respiratory: Negative for cough.   Gastrointestinal: Negative for nausea.  Skin: Positive for wound (tick bite).  Neurological: Positive for headaches.  All other systems reviewed and are negative.     Allergies  Fentanyl; Lactose intolerance (gi); Butrans; Penicillins; and Simvastatin  Home Medications   Prior to Admission medications   Medication Sig Start Date End Date Taking? Authorizing Provider  clopidogrel (PLAVIX) 75 MG tablet Take 1 tablet (75 mg total) by mouth daily with breakfast. 06/23/13  Yes Thurnell Lose, MD  diclofenac sodium (VOLTAREN) 1 % GEL Apply 2 g topically daily as needed (Pain).   Yes Historical Provider, MD  escitalopram (LEXAPRO) 10 MG tablet Take 10 mg by mouth daily.   Yes Historical Provider, MD  esomeprazole (NEXIUM) 40 MG capsule Take 40 mg by mouth daily.    Yes Historical Provider, MD  estrogen, conjugated,-medroxyprogesterone (PREMPRO) 0.625-5 MG per tablet Take  1 tablet by mouth daily.   Yes Historical Provider, MD  HYDROcodone-acetaminophen (NORCO) 7.5-325 MG per tablet Take 1 tablet by mouth every 6 (six) hours as needed for moderate pain. Up to 4 times daily   Yes Historical Provider, MD  metoprolol succinate (TOPROL-XL) 25 MG 24 hr tablet Take 25 mg by mouth daily.   Yes Historical Provider, MD  Naltrexone HCl POWD Take 5 mg by mouth daily. Has it compounded into tablet form   Yes Historical Provider, MD  Olmesartan-Amlodipine-HCTZ (TRIBENZOR) 40-5-25 MG TABS Take 1 tablet by mouth daily.   Yes Historical Provider, MD  pravastatin (PRAVACHOL) 40 MG tablet Take 40 mg by mouth at bedtime.   Yes Historical Provider, MD  spironolactone (ALDACTONE) 25 MG tablet Take 25 mg by mouth daily.    Yes Historical Provider, MD  Tafluprost (ZIOPTAN) 0.0015 % SOLN Place 1 drop into both eyes at bedtime.   Yes Historical Provider, MD  temazepam (RESTORIL) 30 MG capsule Take 30 mg by mouth at bedtime.   Yes Historical Provider, MD  Tofacitinib Citrate (XELJANZ) 5 MG TABS Take 5 mg by mouth 2 (two) times daily.   Yes Historical Provider, MD   Triage Vitals: BP 131/71  Pulse 91  Temp(Src) 97.9 F (36.6 C) (Oral)  Resp 18  Ht 5\' 6"  (1.676 m)  Wt 260 lb (117.935 kg)  BMI 41.99 kg/m2  SpO2 96% Physical Exam  Nursing note and vitals reviewed. Constitutional: She is oriented to person, place, and time. She appears well-developed and well-nourished. No distress.  HENT:  Head: Normocephalic and atraumatic.  Eyes: Conjunctivae and EOM are normal.  Neck: Neck supple. No rigidity. No tracheal deviation present.  Cardiovascular: Normal rate.   Pulmonary/Chest: Effort normal. No respiratory distress.  Musculoskeletal: Normal range of motion. She exhibits tenderness.  Scalp tenderness over left occipital nerve.   Neurological: She is alert and oriented to person, place, and time. She has normal reflexes. No cranial nerve deficit.  5/5 strength in UE and LE.  Skin: Skin  is warm and dry.  No concerning skin lesions.  Psychiatric: She has a normal mood and affect. Her behavior is normal.    ED Course  NERVE BLOCK Date/Time: 12/12/2013 12:30 PM Performed by: Virgel Manifold Authorized by: Virgel Manifold Consent: Verbal consent obtained. Risks and benefits: risks, benefits and alternatives were discussed Consent given by: patient Patient identity confirmed: provided demographic data and verbally with patient Time out: Immediately prior to procedure a "time out" was called to verify the correct patient, procedure, equipment, support staff and site/side  marked as required. Indications: pain relief Body area: head Nerve: greater occipital Laterality: left Patient sedated: no Preparation: Patient was prepped and draped in the usual sterile fashion. Patient position: sitting Needle gauge: 25 G Location technique: anatomical landmarks Local anesthetic: bupivacaine 0.5% without epinephrine Anesthetic total: 2 ml Outcome: pain improved Patient tolerance: Patient tolerated the procedure well with no immediate complications.   (including critical care time)   DIAGNOSTIC STUDIES: Oxygen Saturation is 96% on room air, adequate by my interpretation.    COORDINATION OF CARE: 12:43 PM- Treatment plan was discussed with patient who verbalizes understanding and agrees.     Labs Review Labs Reviewed - No data to display  Imaging Review No results found.   EKG Interpretation None     Medications - No data to display   MDM   Final diagnoses:  Occipital neuralgia of left side    68yF with intermittent sharp pain in occipital region consistent with occipital neuralgia. Given nerve block. Symptoms improved. I suspect tick bite unrelated. Return precautions discussed.   I personally preformed the services scribed in my presence. The recorded information has been reviewed is accurate. Virgel Manifold, MD.    Virgel Manifold, MD 12/22/13 [phone]

## 2013-12-12 NOTE — ED Notes (Addendum)
C/o pain left lower  posterior head.  Times 2 days.  States she can feel a lump .  Tick bite on head 7 days ago. Pt also wants to let us know that she is  wearing a halter monitor.,  And can we look at the monitor while she is here.

## 2014-01-12 DIAGNOSIS — I2510 Atherosclerotic heart disease of native coronary artery without angina pectoris: Secondary | ICD-10-CM | POA: Diagnosis not present

## 2014-01-12 DIAGNOSIS — Z8673 Personal history of transient ischemic attack (TIA), and cerebral infarction without residual deficits: Secondary | ICD-10-CM | POA: Diagnosis not present

## 2014-01-12 DIAGNOSIS — R002 Palpitations: Secondary | ICD-10-CM | POA: Diagnosis not present

## 2014-01-25 ENCOUNTER — Encounter: Payer: Self-pay | Admitting: Neurology

## 2014-01-25 ENCOUNTER — Ambulatory Visit (INDEPENDENT_AMBULATORY_CARE_PROVIDER_SITE_OTHER): Payer: Medicare Other | Admitting: Neurology

## 2014-01-25 VITALS — BP 140/75 | HR 88 | Temp 98.0°F | Ht 65.0 in | Wt 271.0 lb

## 2014-01-25 DIAGNOSIS — G4733 Obstructive sleep apnea (adult) (pediatric): Secondary | ICD-10-CM

## 2014-01-25 DIAGNOSIS — I10 Essential (primary) hypertension: Secondary | ICD-10-CM

## 2014-01-25 DIAGNOSIS — E669 Obesity, unspecified: Secondary | ICD-10-CM

## 2014-01-25 DIAGNOSIS — G459 Transient cerebral ischemic attack, unspecified: Secondary | ICD-10-CM | POA: Diagnosis not present

## 2014-01-25 NOTE — Patient Instructions (Addendum)
We will try you on a autoPAP for a month or two and then I will see you back. Use your machine for now until we can get you on autoPAP. I will fax my order [place]. Call us back if you don't hear from them. They will likely give you a loaner CPAP machine.  Use biotene before bedtime and use your humidifier.

## 2014-01-25 NOTE — Progress Notes (Signed)
Subjective:    Patient ID: [name] is a 68 y.o. female.  HPI    Star Age, MD, PhD [facility] [address] [place], [place] [address]  Dear [name],   I saw your patient, [name], upon your kind request in my neurologic clinic today for initial consultation of her sleep disorder, in particular her prior diagnosis of OSA and for reevaluation. The patient is unaccompanied by today. As you know, [name] is a 68 year old right-handed woman with an underlying medical history of hyperlipidemia, type 2 diabetes, hypertension, and heart disease, status post CABG in 1986, obesity, and history of TIA in 2012, who was diagnosed with obstructive sleep apnea several years ago with intermittent CPAP use. She has not been tolerating it and feels that she sleeps better without it. Her last sleep study was about 3 years ago. I do not have any sleep related records available for review. She reports difficulty with her mask and the pressure and c/o severe mouth dryness. She tells me up front, that she does not wish to have another sleep study. She has a Geophysicist/field seismologist, on a pressure of 11 cm and uses a small Quattro FFM. She has sinus issues and uses a saline spray. She has not used her CPAP for the last 3 months. I reviewed the patient's CPAP compliance data from 08/09/13 to 01/24/14, which is a total of 170 days, during which time the patient used CPAP only on 15 nights. Residual AHI was 3.6/hour. She reports a prior diagnosis of severe sleep apnea. She has seen Dr. Leonie Braun for her TIA.  Her typical bedtime is reported to be around 10 PM and reads for an hour and takes Temazepam 30 mg nightly, per PCP, Dr. Dagmar Braun. Her usual wake time is around 6 to 8 AM. Sleep onset typically occurs within an hour. She reports feeling adequately rested upon awakening, but not well with CPAP. She wakes up on an average 1 times in the middle of the night and has to go to the  bathroom 1 times on a typical night. She denies morning headaches. She does not nap. She drinks one caffeine beverage per day and rarely drinks alcohol, she quit smoking.  She reports excessive daytime somnolence (EDS) and Her Epworth Sleepiness Score (ESS) is 5/24 today. She has not fallen asleep while driving. The patient has not been taking a planned nap maybe 2-3 times per week.   Her Past Medical History Is Significant For: Past Medical History  Diagnosis Date  . Hypertension   . Dysrhythmia   . Sleep apnea     sleep study  oct 2012  . Anginal pain     sees Dr [name].   . Diabetes mellitus without complication     no meds -diet controlled  . Stroke 2011    [place]    . Peripheral vascular disease     legs  . GERD (gastroesophageal reflux disease)   . Arthritis     rheumatoid ..   . Fibromyalgia   . Liver disease 08- 2012    per Dr [name] pt has enlarged liver  . Thyroid disease   . Diverticulitis   . High cholesterol     Her Past Surgical History Is Significant For: Past Surgical History  Procedure Laterality Date  . Coronary artery bypass graft  1986  . Cardiac catheterization  1996  1986  . Eye surgery  2013    cat ext  bilateral .  . Eye surgery  2002    laser  . Fracture surgery  -1988- 2007    rod right leg - right wrist plate  . Cholecystectomy  1996  . Pars plana vitrectomy  03/25/2012    Procedure: PARS PLANA VITRECTOMY WITH 25 GAUGE;  Surgeon: Melody Pedro, MD;  Location: [facility];  Service: Ophthalmology;  Laterality: Right;  . Gas/fluid exchange  03/25/2012    Procedure: GAS/FLUID EXCHANGE;  Surgeon: Melody Pedro, MD;  Location: [facility];  Service: Ophthalmology;  Laterality: Right;    Her Family History Is Significant For: Family History  Problem Relation Age of Onset  . Hypertension Daughter   . Rheum arthritis Maternal Grandmother     Her Social History Is Significant For: History   Social History  . Marital Status: Married    Spouse  Name: [name]    Number of Children: 2  . Years of Education: 12   Occupational History  . Retired    Social History Main Topics  . Smoking status: Former Smoker -- 0.25 packs/day for 5 years    Types: Cigarettes    Quit date: 06/11/1984  . Smokeless tobacco: Never Used  . Alcohol Use: 0.0 oz/week     Comment: occ  . Drug Use: No  . Sexual Activity: Yes   Other Topics Concern  . None   Social History Narrative  . None    Her Allergies Are:  Allergies  Allergen Reactions  . Fentanyl Anaphylaxis and Shortness Of Breath  . Lactose Intolerance (Gi) Other (See Comments)    G.I. Upset  . Butrans [Buprenorphine] Rash and Other (See Comments)    Infected skin underneath application  . Penicillins Rash    Facial rash  . Simvastatin Rash  :   Her Current Medications Are:  Outpatient Encounter Prescriptions as of 01/25/2014  Medication Sig  . clopidogrel (PLAVIX) 75 MG tablet Take 1 tablet (75 mg total) by mouth daily with breakfast.  . diclofenac sodium (VOLTAREN) 1 % GEL Apply 2 g topically daily as needed (Pain).  Marland Kitchen escitalopram (LEXAPRO) 10 MG tablet Take 10 mg by mouth daily.  Marland Kitchen esomeprazole (NEXIUM) 40 MG capsule Take 40 mg by mouth daily.   Marland Kitchen estrogen, conjugated,-medroxyprogesterone (PREMPRO) 0.625-5 MG per tablet Take 1 tablet by mouth daily.  Marland Kitchen gabapentin (NEURONTIN) 100 MG capsule Take 1 capsule (100 mg total) by mouth 3 (three) times daily.  Marland Kitchen HYDROcodone-acetaminophen (NORCO) 7.5-325 MG per tablet Take 1 tablet by mouth every 6 (six) hours as needed for moderate pain. Up to 4 times daily  . metoprolol succinate (TOPROL-XL) 25 MG 24 hr tablet Take 25 mg by mouth daily.  . Naltrexone HCl POWD Take 5 mg by mouth daily. Has it compounded into tablet form  . Olmesartan-Amlodipine-HCTZ (TRIBENZOR) 40-5-25 MG TABS Take 1 tablet by mouth daily.  . pravastatin (PRAVACHOL) 40 MG tablet Take 40 mg by mouth at bedtime.  Marland Kitchen spironolactone (ALDACTONE) 25 MG tablet Take 25 mg by  mouth daily.   . temazepam (RESTORIL) 30 MG capsule Take 30 mg by mouth at bedtime.  . Tofacitinib Citrate (XELJANZ) 5 MG TABS Take 5 mg by mouth 2 (two) times daily.  . [DISCONTINUED] Tafluprost (ZIOPTAN) 0.0015 % SOLN Place 1 drop into both eyes at bedtime.  :  Review of Systems:  Out of a complete 14 point review of systems, all are reviewed and negative with the exception of these symptoms as listed below:   Review  of Systems  Constitutional: Positive for fatigue.  Musculoskeletal:       Joint pain  Allergic/Immunologic:       Allergies  Psychiatric/Behavioral:       Decreased energy    Objective:  Neurologic Exam  Physical Exam Physical Examination:   Filed Vitals:   01/25/14 1034  BP: 140/75  Pulse: 88  Temp: 98 F (36.7 C)    General Examination: The patient is a very pleasant 68 y.o. female in no acute distress. She appears well-developed and well-nourished and well groomed. She is obese.   HEENT: Normocephalic, atraumatic, pupils are equal, round and reactive to light and accommodation. Funduscopic exam is normal with sharp disc margins noted. Extraocular tracking is good without limitation to gaze excursion or nystagmus noted. Normal smooth pursuit is noted. Hearing is grossly intact. Tympanic membranes are clear bilaterally. Face is symmetric with normal facial animation and normal facial sensation. Speech is clear with no dysarthria noted. There is no hypophonia. There is no lip, neck/head, jaw or voice tremor. Neck is supple with full range of passive and active motion. There are no carotid bruits on auscultation. Oropharynx exam reveals: moderate mouth dryness, adequate dental hygiene and marked airway crowding, due to large tongue and redundant soft palate . Mallampati is class III. Tongue protrudes centrally and palate elevates symmetrically. Tonsils are absent. Neck size is 17 inches.    Chest: Clear to auscultation without wheezing, rhonchi or crackles  noted.  Heart: S1+S2+0, regular and normal without murmurs, rubs or gallops noted.   Abdomen: Soft, non-tender and non-distended with normal bowel sounds appreciated on auscultation.  Extremities: There is no pitting edema in the distal lower extremities bilaterally. Pedal pulses are intact.  Skin: Warm and dry without trophic changes noted. There are no varicose veins.  Musculoskeletal: exam reveals no obvious joint deformities, tenderness or joint swelling or erythema.   Neurologically:  Mental status: The patient is awake, alert and oriented in all 4 spheres. Her immediate and remote memory, attention, language skills and fund of knowledge are appropriate. There is no evidence of aphasia, agnosia, apraxia or anomia. Speech is clear with normal prosody and enunciation. Thought process is linear. Mood is normal and affect is normal.  Cranial nerves II - XII are as described above under HEENT exam. In addition: shoulder shrug is normal with equal shoulder height noted. Motor exam: Normal bulk, strength and tone is noted. There is no drift, tremor or rebound. Romberg is negative. Reflexes are 2+ throughout. Fine motor skills and coordination: intact with normal finger taps, normal hand movements, normal rapid alternating patting, normal foot taps and normal foot agility.  Cerebellar testing: No dysmetria or intention tremor on finger to nose testing. There is no truncal or gait ataxia.  Sensory exam: intact to light touch, vibration, temperature sense in the upper and lower extremities.  Gait, station and balance: She stands with mild difficulty due to back pain. No veering to one side is noted. No leaning to one side is noted. Posture is age-appropriate and stance is narrow based. Gait shows normal stride length and normal pace. No problems turning are noted. She turns en bloc.                Assessment and Plan:   In summary, Taylynn Easton Ewalt is a very pleasant 68 y.o.-year old female with an  underlying medical history of hyperlipidemia, type 2 diabetes, hypertension, and heart disease, status post CABG in 1986, obesity, and history of TIA  in 2012, who was diagnosed with obstructive sleep apnea several years ago and she has not been compliant with CPAP use. She feels that she sleeps that her without it. She does report a prior diagnosis of severe sleep apnea and her pressure currently is at 11. She uses a full face mask. She does not seem to have a trouble with the mask or the pressure per se but her main complaint is mouth dryness. When she uses her CPAP she may have adequate treatment as her residual AHI was less than 5 per hour on for download. She had only used her machine 15 days out of the last 170 days. Given her medical history of heart disease and TIA she is at risk for cardiovascular complications especially if she truly has severe sleep apnea. I advised her very strongly to be fully compliant with treatment. She is extremely hesitant to come back for another sleep study. I explained to her that the only way I could really tell if she is well treated and how that her underlying sleep apnea is is to do another sleep study during which we would adjust her treatment and see if we can get away with a smaller pressure and a different mask. At this juncture however I would be willing to change her CPAP setting to an auto Pap and I have advised her to be compliant with that for a month or 2 and I will see her back with a download. For her mouth dryness I have advised her to increase her humidifier setting to maximum and also try Biotene before bedtime. I would like to see previous test results. She thinks she may have a report on her sleep study at home. If she truly has severe sleep apnea, positive airway pressure treatment is really the only successful treatment option she may have.  I answered all her questions today and the patient was in agreement. I would like to see her back in about 2-3  months, once she has had a chance to be on AutoPAP.  Thank you very much for allowing me to participate in the care of this nice patient. If I can be of any further assistance to you please do not hesitate to call me at [phone].  Sincerely,   Star Age, MD, PhD

## 2014-02-17 DIAGNOSIS — R002 Palpitations: Secondary | ICD-10-CM | POA: Diagnosis not present

## 2014-02-17 DIAGNOSIS — R5383 Other fatigue: Secondary | ICD-10-CM | POA: Diagnosis not present

## 2014-02-17 DIAGNOSIS — R5381 Other malaise: Secondary | ICD-10-CM | POA: Diagnosis not present

## 2014-02-17 DIAGNOSIS — I499 Cardiac arrhythmia, unspecified: Secondary | ICD-10-CM | POA: Diagnosis not present

## 2014-02-17 DIAGNOSIS — I498 Other specified cardiac arrhythmias: Secondary | ICD-10-CM | POA: Diagnosis not present

## 2014-03-10 DIAGNOSIS — M069 Rheumatoid arthritis, unspecified: Secondary | ICD-10-CM | POA: Diagnosis not present

## 2014-03-10 DIAGNOSIS — M1990 Unspecified osteoarthritis, unspecified site: Secondary | ICD-10-CM | POA: Diagnosis not present

## 2014-03-10 DIAGNOSIS — Z23 Encounter for immunization: Secondary | ICD-10-CM | POA: Diagnosis not present

## 2014-03-19 DIAGNOSIS — I10 Essential (primary) hypertension: Secondary | ICD-10-CM | POA: Diagnosis not present

## 2014-03-19 DIAGNOSIS — M069 Rheumatoid arthritis, unspecified: Secondary | ICD-10-CM | POA: Diagnosis not present

## 2014-03-19 DIAGNOSIS — D649 Anemia, unspecified: Secondary | ICD-10-CM | POA: Diagnosis not present

## 2014-03-19 DIAGNOSIS — M797 Fibromyalgia: Secondary | ICD-10-CM | POA: Diagnosis not present

## 2014-03-19 DIAGNOSIS — G459 Transient cerebral ischemic attack, unspecified: Secondary | ICD-10-CM | POA: Diagnosis not present

## 2014-03-19 DIAGNOSIS — J9610 Chronic respiratory failure, unspecified whether with hypoxia or hypercapnia: Secondary | ICD-10-CM | POA: Diagnosis not present

## 2014-03-19 DIAGNOSIS — E119 Type 2 diabetes mellitus without complications: Secondary | ICD-10-CM | POA: Diagnosis not present

## 2014-03-19 DIAGNOSIS — Z79899 Other long term (current) drug therapy: Secondary | ICD-10-CM | POA: Diagnosis not present

## 2014-03-24 ENCOUNTER — Encounter: Payer: Self-pay | Admitting: Neurology

## 2014-03-24 ENCOUNTER — Ambulatory Visit: Payer: Medicare Other | Admitting: Neurology

## 2014-03-24 ENCOUNTER — Ambulatory Visit (INDEPENDENT_AMBULATORY_CARE_PROVIDER_SITE_OTHER): Payer: Medicare Other | Admitting: Neurology

## 2014-03-24 VITALS — BP 131/71 | HR 88 | Wt 276.0 lb

## 2014-03-24 DIAGNOSIS — IMO0001 Reserved for inherently not codable concepts without codable children: Secondary | ICD-10-CM | POA: Insufficient documentation

## 2014-03-24 DIAGNOSIS — R209 Unspecified disturbances of skin sensation: Secondary | ICD-10-CM

## 2014-03-24 DIAGNOSIS — G5602 Carpal tunnel syndrome, left upper limb: Secondary | ICD-10-CM

## 2014-03-24 DIAGNOSIS — G5600 Carpal tunnel syndrome, unspecified upper limb: Secondary | ICD-10-CM | POA: Insufficient documentation

## 2014-03-24 DIAGNOSIS — G5603 Carpal tunnel syndrome, bilateral upper limbs: Secondary | ICD-10-CM

## 2014-03-24 DIAGNOSIS — G5601 Carpal tunnel syndrome, right upper limb: Secondary | ICD-10-CM | POA: Diagnosis not present

## 2014-03-24 NOTE — Patient Instructions (Signed)
I had a long discussion with the patient and her husband regarding her new complaints of hand history she is a night which likely represent mild carpal tunnel syndrome. She is refusing diagnostic workup for that and I've advised her to wear wrist extension splints as long as possible and to avoid activities with rapid repetitive wrist flexion movements. The patient has been diagnosed with anemia recently and is likely going to get an endoscopy procedures. She is neurologically cleared to hold the Plavix for 3-5 days prior to the procedure and to restart it when it is safe postprocedure. She may also want to coordinate and get the epidural steroid injection, neurosurgeon during this same period when Plavix is on hold. She was again counseled to maintain strict control of hypertension with blood pressure goal below 130/90 and lipids the LDL cholesterol goal below 70 mg percent. Return for followup in 6 months or call as he necessary  Carpal Tunnel Syndrome The carpal tunnel is a narrow area located on the palm side of your wrist. The tunnel is formed by the wrist bones and ligaments. Nerves, blood vessels, and tendons pass through the carpal tunnel. Repeated wrist motion or certain diseases may cause swelling within the tunnel. This swelling pinches the main nerve in the wrist (median nerve) and causes the painful hand and arm condition called carpal tunnel syndrome. CAUSES   Repeated wrist motions.  Wrist injuries.  Certain diseases like arthritis, diabetes, alcoholism, hyperthyroidism, and kidney failure.  Obesity.  Pregnancy. SYMPTOMS   A "pins and needles" feeling in your fingers or hand, especially in your thumb, index and middle fingers.  Tingling or numbness in your fingers or hand.  An aching feeling in your entire arm, especially when your wrist and elbow are bent for long periods of time.  Wrist pain that goes up your arm to your shoulder.  Pain that goes down into your palm or  fingers.  A weak feeling in your hands. DIAGNOSIS  Your health care provider will take your history and perform a physical exam. An electromyography test may be needed. This test measures electrical signals sent out by your nerves into the muscles. The electrical signals are usually slowed by carpal tunnel syndrome. You may also need X-rays. TREATMENT  Carpal tunnel syndrome may clear up by itself. Your health care provider may recommend a wrist splint or medicine such as a nonsteroidal anti-inflammatory medicine. Cortisone injections may help. Sometimes, surgery may be needed to free the pinched nerve.  HOME CARE INSTRUCTIONS   Take all medicine as directed by your health care provider. Only take over-the-counter or prescription medicines for pain, discomfort, or fever as directed by your health care provider.  If you were given a splint to keep your wrist from bending, wear it as directed. It is important to wear the splint at night. Wear the splint for as long as you have pain or numbness in your hand, arm, or wrist. This may take 1 to 2 months.  Rest your wrist from any activity that may be causing your pain. If your symptoms are work-related, you may need to talk to your employer about changing to a job that does not require using your wrist.  Put ice on your wrist after long periods of wrist activity.  Put ice in a plastic bag.  Place a towel between your skin and the bag.  Leave the ice on for 15-20 minutes, 03-04 times a day.  Keep all follow-up visits as directed by your  health care provider. This includes any orthopedic referrals, physical therapy, and rehabilitation. Any delay in getting necessary care could result in a delay or failure of your condition to heal. SEEK IMMEDIATE MEDICAL CARE IF:   You have new, unexplained symptoms.  Your symptoms get worse and are not helped or controlled with medicines. MAKE SURE YOU:   Understand these instructions.  Will watch your  condition.  Will get help right away if you are not doing well or get worse. Document Released: 05/25/2000 Document Revised: 10/12/2013 Document Reviewed: 04/13/2011 [facility] Patient Information 2015 [place], [place]. This information is not intended to replace advice given to you by your health care provider. Make sure you discuss any questions you have with your health care provider.

## 2014-03-24 NOTE — Progress Notes (Signed)
[facility] [address] [place]. [place] [address] [phone]       OFFICE FOLLOW-UP NOTE  [name] Date of Birth:  June 25, 1945 Medical Record Number:  [id number]   HPI: 75 year Caucasian lady seen today for first office followup visit following hospital consideration for TIA and 06/20/13. She presented with sudden onset of left hand weakness numbness and inability to hold objects as well as tingling sensation. And was flopping around. She could not reach behind her and hold the telephone her hand. These symptoms lasted only 3-4 minutes and resolved completely. She took 4 tablets of baby aspirin and came to the emergency room. She had a somewhat similar episode 5 years ago in [facility] and at that time evaluation including MRI 1 revealing. She had been taking aspirin 81 mg daily since then. Following this recent episode she was switched to Plavix. Rest of her hospital evaluation was fairly unremarkable including MRI scan of the brain which showed no acute infarct MRA showed no intracranial large vessel stenosis carotid ultrasound showed no extracranial stenosis lipid profile was normal hemoglobin A1c was borderline at 6.7. Urine drug screen was negative. Transthoracic echo showed normal ejection fraction without corrective source of embolism. EKG showed sinus rhythm and telemetry monitoring did not reveal any arrhythmia. Urine drug screen was normal. She states she's done well since discharge according to without bleeding, bruising or other side effects we did she is not been able to exercise a lot because of severe neck and back pain. She has in fact seen neurosurgeon who recommends next is she the patient is reluctant to hold Plavix for fear of recurrent TIA/stroke. She states her sugars have been well controlled with fasting sugars being in the 120s. Blood pressure is quite good and today it is 1236/69 office. She does use CPAP every night. Update  03/24/2014 ; she returns for followup after her last visit with me 6 months ago. She continues to do well from a neurovascular standpoint without TIA or stroke symptoms. She had some palpitations and underwent a 53-week-old the monitor which showed only some couplet beats. She was on Toprol which was discontinued the palpitations were not helped. She had been started by her rheumatologist on naltrexone which she discontinued and seems to have helped the palpitations. She has also been having intermittent occipital headache and neck pain for which she was seen in the ER and was treated with nonsteroidal and inflammatory drugs with modest relief. She still has some intermittent pain. She has some tender points in the back of her head. She has recently been diagnosed with mild anemia but her rheumatologist and is planning to work up for that. She had a stool exam today and plans to see a gastroenterologist for endoscopy procedures. She has also been advised epidural steroid injection in her neck and she is considering that as well by her neurosurgeon. She also doesn't complain of tingling and numbness in her hands particularly night she wakes up with these symptoms and she gets partial relief by shaking her hands. She has not formally been diagnosed with carpal tunnel. She is wondering to try new medications or undergo EMG nerve conduction testing. She has questions about holding Plavix for her pending endoscopy procedures and epidural injections. ROS:   14 system review of systems is positive for fatigue, appetite change, chills, eye itching, palpitations, apnea, and joint pain, back pain, neck pain, walking difficulty, itching and all other systems negative PMH:  Past  Medical History  Diagnosis Date  . Hypertension   . Dysrhythmia   . Sleep apnea     sleep study  oct 2012  . Anginal pain     sees Dr Einar Gip.   . Diabetes mellitus without complication     no meds -diet controlled  . Stroke 2011     [place]    . Peripheral vascular disease     legs  . GERD (gastroesophageal reflux disease)   . Arthritis     rheumatoid ..   . Fibromyalgia   . Liver disease 08- 2012    per Dr Dagmar Hait pt has enlarged liver  . Thyroid disease   . Diverticulitis   . High cholesterol     Social History:  History   Social History  . Marital Status: Married    Spouse Name: Herbie Baltimore    Number of Children: 2  . Years of Education: 12   Occupational History  . Retired    Social History Main Topics  . Smoking status: Former Smoker -- 0.25 packs/day for 5 years    Types: Cigarettes    Quit date: 06/11/1984  . Smokeless tobacco: Never Used  . Alcohol Use: 0.0 oz/week     Comment: occ  . Drug Use: No  . Sexual Activity: Yes   Other Topics Concern  . Not on file   Social History Narrative  . No narrative on file    Medications:   Current Outpatient Prescriptions on File Prior to Visit  Medication Sig Dispense Refill  . clopidogrel (PLAVIX) 75 MG tablet Take 1 tablet (75 mg total) by mouth daily with breakfast.  30 tablet  0  . diclofenac sodium (VOLTAREN) 1 % GEL Apply 2 g topically daily as needed (Pain).      Marland Kitchen escitalopram (LEXAPRO) 10 MG tablet Take 10 mg by mouth daily.      Marland Kitchen esomeprazole (NEXIUM) 40 MG capsule Take 40 mg by mouth daily.       Marland Kitchen estrogen, conjugated,-medroxyprogesterone (PREMPRO) 0.625-5 MG per tablet Take 1 tablet by mouth daily.      . metoprolol succinate (TOPROL-XL) 25 MG 24 hr tablet Take 25 mg by mouth daily.      . Naltrexone HCl POWD Take 5 mg by mouth daily. Has it compounded into tablet form      . Olmesartan-Amlodipine-HCTZ (TRIBENZOR) 40-5-25 MG TABS Take 1 tablet by mouth daily.      . pravastatin (PRAVACHOL) 40 MG tablet Take 40 mg by mouth at bedtime.      Marland Kitchen spironolactone (ALDACTONE) 25 MG tablet Take 25 mg by mouth daily.       . temazepam (RESTORIL) 30 MG capsule Take 30 mg by mouth at bedtime.      . Tofacitinib Citrate (XELJANZ) 5 MG TABS  Take 5 mg by mouth 2 (two) times daily.       No current facility-administered medications on file prior to visit.    Allergies:   Allergies  Allergen Reactions  . Fentanyl Anaphylaxis and Shortness Of Breath  . Lactose Intolerance (Gi) Other (See Comments)    G.I. Upset  . Butrans [Buprenorphine] Rash and Other (See Comments)    Infected skin underneath application  . Penicillins Rash    Facial rash  . Simvastatin Rash    Physical Exam General: Obese middle-age Caucasian lady seated, in no evident distress Head: head normocephalic and atraumatic.   Neck: supple with no carotid or supraclavicular bruits Cardiovascular: regular rate  and rhythm, no murmurs Musculoskeletal: no deformity. Tender points bilateral occipital grooves Skin:  no rash/petichiae Vascular:  Normal pulses all extremities Filed Vitals:   03/24/14 1528  BP: 131/71  Pulse: 88   Neurologic Exam Mental Status: Awake and fully alert. Oriented to place and time. Recent and remote memory intact. Attention span, concentration and fund of knowledge appropriate. Mood and affect appropriate.  Cranial Nerves: Fundoscopic exam reveals sharp disc margins. Pupils equal, briskly reactive to light. Extraocular movements full without nystagmus. Visual fields full to confrontation. Hearing intact. Facial sensation intact. Face, tongue, palate moves normally and symmetrically.  Motor: Normal bulk and tone. Normal strength in all tested extremity muscles. Sensory.: intact to touch and pinprick and vibratory sensation. Positive Tinel`s sign at right wrist. Coordination: Rapid alternating movements normal in all extremities. Finger-to-nose and heel-to-shin performed accurately bilaterally. Gait and Station: Arises from chair without difficulty. Stance is normal. Gait demonstrates normal stride length and balance . Unable to heel, toe and tandem walk without difficulty.  Reflexes: 1+ and symmetric. Toes downgoing.      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 New complain of bilateral hand paresthesias likely from carpal tunnel syndrome    PLAN:  I had a long discussion with the patient and her husband regarding her new complaints of hand history she is a night which likely represent mild carpal tunnel syndrome. She is refusing diagnostic workup for that and I've advised her to wear wrist extension splints as long as possible and to avoid activities with rapid repetitive wrist flexion movements. The patient has been diagnosed with anemia recently and is likely going to get an endoscopy procedures. She is neurologically cleared to hold the Plavix for 3-5 days prior to the procedure and to restart it when it is safe postprocedure. She may also want to coordinate and get the epidural steroid injection, neurosurgeon during this same period when Plavix is on hold. She was again counseled to maintain strict control of hypertension with blood pressure goal below 130/90 and lipids the LDL cholesterol goal below 70 mg percent. Return for followup in 6 months or call as he necessary       Note: This document was prepared with digital dictation and possible smart phrase technology. Any transcriptional errors that result from this process are unintentional

## 2014-03-25 DIAGNOSIS — Z1212 Encounter for screening for malignant neoplasm of rectum: Secondary | ICD-10-CM | POA: Diagnosis not present

## 2014-04-02 DIAGNOSIS — H34813 Central retinal vein occlusion, bilateral: Secondary | ICD-10-CM | POA: Diagnosis not present

## 2014-04-02 DIAGNOSIS — H4011X1 Primary open-angle glaucoma, mild stage: Secondary | ICD-10-CM | POA: Diagnosis not present

## 2014-04-29 ENCOUNTER — Ambulatory Visit (INDEPENDENT_AMBULATORY_CARE_PROVIDER_SITE_OTHER): Payer: Medicare Other | Admitting: Nurse Practitioner

## 2014-04-29 ENCOUNTER — Encounter: Payer: Self-pay | Admitting: Nurse Practitioner

## 2014-04-29 VITALS — BP 135/68 | HR 87 | Temp 96.8°F | Ht 66.0 in | Wt 275.0 lb

## 2014-04-29 DIAGNOSIS — G4733 Obstructive sleep apnea (adult) (pediatric): Secondary | ICD-10-CM

## 2014-04-29 NOTE — Progress Notes (Signed)
PATIENT: [name] DOB: [birth year]  REASON FOR VISIT: routine follow up for OSA HISTORY FROM: patient  HISTORY OF PRESENT ILLNESS: [name] is seen today as followup for her prior diagnosis of OSA.   [name] is a 68 year old right-handed woman with an underlying medical history of hyperlipidemia, type 2 diabetes, hypertension, and heart disease, status post CABG in 1986, obesity, and history of TIA in 2012, who was diagnosed with obstructive sleep apnea several years ago with intermittent CPAP use. She has not been tolerating it and feels that she sleeps better without it. Her last sleep study was about 3 years ago. I do not have any sleep related records available for review. She reports difficulty with her mask and the pressure and c/o severe mouth dryness. She tells me up front, that she does not wish to have another sleep study. She has a Geophysicist/field seismologist, on a pressure of 11 cm and uses a small Quattro FFM. She has sinus issues and uses a saline spray. She has not used her CPAP for the last 3 months. I reviewed the patient's CPAP compliance data from 08/09/13 to 01/24/14, which is a total of 170 days, during which time the patient used CPAP only on 15 nights. Residual AHI was 3.6/hour. She reports a prior diagnosis of severe sleep apnea. She has seen Dr. Leonie Man for her TIA.  Her typical bedtime is reported to be around 10 PM and reads for an hour and takes Temazepam 30 mg nightly, per PCP, Dr. Dagmar Hait. Her usual wake time is around 6 to 8 AM. Sleep onset typically occurs within an hour. She reports feeling adequately rested upon awakening, but not well with CPAP. She wakes up on an average 1 times in the middle of the night and has to go to the bathroom 1 times on a typical night. She denies morning headaches. She does not nap. She drinks one caffeine beverage per day and rarely drinks alcohol, she quit smoking.  She reports excessive daytime somnolence (EDS) and Her Epworth Sleepiness  Score (ESS) is 5/24 today. She has not fallen asleep while driving. The patient has not been taking a planned nap maybe 2-3 times per week.   Update 04/29/14: [name] comes to the office today for review of her her last 3 month cpap compliance download. She states that she has worn her mask every night except one. Her Epworth Sleepiness Score (ESS) is 9/24 today and her Fatigue Severity Score is 59. She is interested in getting a new CPAP machine with auto-titration and the nasal pillow instead of the full-face mask.  No compliance data could be retrieved from her SD card during the visit because the only laptop with the software is broken. Dr. Rexene Alberts will need to get compliance data from [facility] and contact her with recommendations.   REVIEW OF SYSTEMS: Full 14 system review of systems performed and notable only for: chills, fatigue, sleep apnea, daytime sleepiness, joint pain, back pain, aching muscles, muscle cramps, walking difficulty, neck pain   ALLERGIES: Allergies  Allergen Reactions  . Fentanyl Anaphylaxis and Shortness Of Breath  . Lactose Intolerance (Gi) Other (See Comments)    G.I. Upset  . Butrans [Buprenorphine] Rash and Other (See Comments)    Infected skin underneath application  . Penicillins Rash    Facial rash  . Simvastatin Rash    HOME MEDICATIONS: Outpatient Prescriptions Prior to Visit  Medication Sig Dispense Refill  . clopidogrel (PLAVIX) 75 MG tablet Take  1 tablet (75 mg total) by mouth daily with breakfast. 30 tablet 0  . diclofenac sodium (VOLTAREN) 1 % GEL Apply 2 g topically daily as needed (Pain).    Marland Kitchen escitalopram (LEXAPRO) 10 MG tablet Take 10 mg by mouth daily.    Marland Kitchen esomeprazole (NEXIUM) 40 MG capsule Take 40 mg by mouth daily.     Marland Kitchen estrogen, conjugated,-medroxyprogesterone (PREMPRO) 0.625-5 MG per tablet Take 1 tablet by mouth daily.    . metoprolol succinate (TOPROL-XL) 25 MG 24 hr tablet Take 25 mg by mouth daily.    . Naltrexone HCl POWD Take 5  mg by mouth daily. Has it compounded into tablet form    . Olmesartan-Amlodipine-HCTZ (TRIBENZOR) 40-5-25 MG TABS Take 1 tablet by mouth daily.    . pravastatin (PRAVACHOL) 40 MG tablet Take 40 mg by mouth at bedtime.    Marland Kitchen spironolactone (ALDACTONE) 25 MG tablet Take 25 mg by mouth daily.     . Tafluprost (ZIOPTAN) 0.0015 % SOLN Apply 1 drop to eye daily. Both eyes.    Marland Kitchen temazepam (RESTORIL) 30 MG capsule Take 30 mg by mouth at bedtime.    . Tofacitinib Citrate (XELJANZ) 5 MG TABS Take 5 mg by mouth 2 (two) times daily.     No facility-administered medications prior to visit.    PHYSICAL EXAM Filed Vitals:   04/29/14 1351  BP: 135/68  Pulse: 87  Temp: 96.8 F (36 C)  TempSrc: Oral  Height: 5\' 6"  (1.676 m)  Weight: 275 lb (124.739 kg)   Body mass index is 44.41 kg/(m^2).  General Examination: The patient is a very pleasant 68 y.o. female in no acute distress. She appears well-developed and well-nourished and well groomed. She is obese.   HEENT: Normocephalic, atraumatic, pupils are equal, round and reactive to light and accommodation. Funduscopic exam is normal with sharp disc margins noted. Extraocular tracking is good without limitation to gaze excursion or nystagmus noted. Normal smooth pursuit is noted. Hearing is grossly intact. Tympanic membranes are clear bilaterally. Face is symmetric with normal facial animation and normal facial sensation. Speech is clear with no dysarthria noted. There is no hypophonia. There is no lip, neck/head, jaw or voice tremor. Neck is supple with full range of passive and active motion. There are no carotid bruits on auscultation. Oropharynx exam reveals: moderate mouth dryness, adequate dental hygiene and marked airway crowding, due to large tongue and redundant soft palate . Mallampati is class III. Tongue protrudes centrally and palate elevates symmetrically. Tonsils are absent. Neck size is 17 inches.    Chest: Clear to auscultation without wheezing,  rhonchi or crackles noted.  Heart: S1+S2+0, regular and normal without murmurs, rubs or gallops noted.   Abdomen: Soft, non-tender and non-distended with normal bowel sounds appreciated on auscultation.  Extremities: There is no pitting edema in the distal lower extremities bilaterally. Pedal pulses are intact.  Skin: Warm and dry without trophic changes noted. There are no varicose veins.  Musculoskeletal: exam reveals no obvious joint deformities, tenderness or joint swelling or erythema.   Neurologically:  Mental status: The patient is awake, alert and oriented in all 4 spheres. Her immediate and remote memory, attention, language skills and fund of knowledge are appropriate. There is no evidence of aphasia, agnosia, apraxia or anomia. Speech is clear with normal prosody and enunciation. Thought process is linear. Mood is normal and affect is normal.  Cranial nerves II - XII are as described above under HEENT exam. In addition: shoulder shrug is  normal with equal shoulder height noted. Motor exam: Normal bulk, strength and tone is noted. There is no drift, tremor or rebound. Romberg is negative. Reflexes are 2+ throughout. Fine motor skills and coordination: intact with normal finger taps, normal hand movements, normal rapid alternating patting, normal foot taps and normal foot agility.  Cerebellar testing: No dysmetria or intention tremor on finger to nose testing. There is no truncal or gait ataxia.  Sensory exam: intact to light touch, vibration, temperature sense in the upper and lower extremities.  Gait, station and balance: She stands with mild difficulty due to back pain. No veering to one side is noted. No leaning to one side is noted. Posture is age-appropriate and stance is narrow based. Gait shows normal stride length and normal pace. No problems turning are noted. She turns en bloc.                Assessment and Plan:   In summary, [name] is a very pleasant 68 y.o.-year  old female with an underlying medical history of hyperlipidemia, type 2 diabetes, hypertension, and heart disease, status post CABG in 1986, obesity, and history of TIA in 2012, who was diagnosed with obstructive sleep apnea several years ago and she has not been compliant with CPAP use. She feels that she sleeps better without it. She does report a prior diagnosis of severe sleep apnea and her pressure currently is at 11. She uses a full face mask. She does not seem to have a trouble with the mask or the pressure per se but her main complaint is mouth dryness. When she uses her CPAP she may have adequate treatment as her residual AHI was less than 5 per hour on for download. She had only used her machine 15 days out of the last 170 days previously.  Given her medical history of heart disease and TIA she is at risk for cardiovascular complications especially if she truly has severe sleep apnea. I advised her very strongly to be fully compliant with treatment. She is extremely hesitant to come back for another sleep study. I explained to her that the only way I could really tell if she is well treated and how that her underlying sleep apnea is is to do another sleep study during which we would adjust her treatment and see if we can get away with a smaller pressure and a different mask.   She returns for 3 month revisit today for a compliance check. Unfortunately we could not get her dcard downloaded to evaluate the information.   PLAN: Dr. Rexene Alberts will need to get compliance data from [facility] and contact her with recommendations.  Patient would like a new CPAP machine that is autotitrated, with a nasal pillow instead of the full-face mask.    Melody Rummage Donita Newland, MSN, FNP-BC, A/GNP-C 04/29/2014, 2:10 PM [facility] [address] [place], [place] [address] [phone]  Note: This document was prepared with digital dictation and possible smart phrase technology. Any transcriptional errors  that result from this process are unintentional.  I reviewed the above note and documentation by the Nurse Practitioner and agree with the history, physical exam, assessment and plan as outlined above. I was immediately available for face-to-face consultation. Star Age, MD, PhD [facility])

## 2014-05-10 DIAGNOSIS — R899 Unspecified abnormal finding in specimens from other organs, systems and tissues: Secondary | ICD-10-CM | POA: Diagnosis not present

## 2014-05-14 DIAGNOSIS — H4011X2 Primary open-angle glaucoma, moderate stage: Secondary | ICD-10-CM | POA: Diagnosis not present

## 2014-06-06 DIAGNOSIS — R0602 Shortness of breath: Secondary | ICD-10-CM | POA: Diagnosis not present

## 2014-06-07 DIAGNOSIS — Z6841 Body Mass Index (BMI) 40.0 and over, adult: Secondary | ICD-10-CM | POA: Diagnosis not present

## 2014-06-07 DIAGNOSIS — R0600 Dyspnea, unspecified: Secondary | ICD-10-CM | POA: Diagnosis not present

## 2014-06-07 DIAGNOSIS — R0609 Other forms of dyspnea: Secondary | ICD-10-CM | POA: Diagnosis not present

## 2014-06-07 DIAGNOSIS — J189 Pneumonia, unspecified organism: Secondary | ICD-10-CM | POA: Diagnosis not present

## 2014-06-07 DIAGNOSIS — R05 Cough: Secondary | ICD-10-CM | POA: Diagnosis not present

## 2014-06-14 ENCOUNTER — Emergency Department (HOSPITAL_COMMUNITY): Payer: Medicare Other

## 2014-06-14 ENCOUNTER — Encounter (HOSPITAL_COMMUNITY): Payer: Self-pay | Admitting: Emergency Medicine

## 2014-06-14 ENCOUNTER — Emergency Department (HOSPITAL_COMMUNITY)
Admission: EM | Admit: 2014-06-14 | Discharge: 2014-06-14 | Disposition: A | Discharge status: Home / Self Care | Payer: Medicare Other | Attending: Emergency Medicine | Admitting: Emergency Medicine

## 2014-06-14 DIAGNOSIS — I10 Essential (primary) hypertension: Secondary | ICD-10-CM | POA: Insufficient documentation

## 2014-06-14 DIAGNOSIS — R059 Cough, unspecified: Secondary | ICD-10-CM

## 2014-06-14 DIAGNOSIS — Z955 Presence of coronary angioplasty implant and graft: Secondary | ICD-10-CM | POA: Insufficient documentation

## 2014-06-14 DIAGNOSIS — Z8673 Personal history of transient ischemic attack (TIA), and cerebral infarction without residual deficits: Secondary | ICD-10-CM | POA: Diagnosis not present

## 2014-06-14 DIAGNOSIS — M797 Fibromyalgia: Secondary | ICD-10-CM | POA: Insufficient documentation

## 2014-06-14 DIAGNOSIS — K219 Gastro-esophageal reflux disease without esophagitis: Secondary | ICD-10-CM | POA: Diagnosis not present

## 2014-06-14 DIAGNOSIS — Z880 Allergy status to penicillin: Secondary | ICD-10-CM | POA: Diagnosis not present

## 2014-06-14 DIAGNOSIS — J209 Acute bronchitis, unspecified: Secondary | ICD-10-CM | POA: Diagnosis not present

## 2014-06-14 DIAGNOSIS — R05 Cough: Secondary | ICD-10-CM | POA: Diagnosis not present

## 2014-06-14 DIAGNOSIS — Z791 Long term (current) use of non-steroidal anti-inflammatories (NSAID): Secondary | ICD-10-CM | POA: Insufficient documentation

## 2014-06-14 DIAGNOSIS — M069 Rheumatoid arthritis, unspecified: Secondary | ICD-10-CM | POA: Diagnosis not present

## 2014-06-14 DIAGNOSIS — R1110 Vomiting, unspecified: Secondary | ICD-10-CM | POA: Diagnosis not present

## 2014-06-14 DIAGNOSIS — I517 Cardiomegaly: Secondary | ICD-10-CM | POA: Diagnosis not present

## 2014-06-14 DIAGNOSIS — E780 Pure hypercholesterolemia: Secondary | ICD-10-CM | POA: Diagnosis not present

## 2014-06-14 DIAGNOSIS — J40 Bronchitis, not specified as acute or chronic: Secondary | ICD-10-CM

## 2014-06-14 DIAGNOSIS — Z8669 Personal history of other diseases of the nervous system and sense organs: Secondary | ICD-10-CM | POA: Diagnosis not present

## 2014-06-14 DIAGNOSIS — E119 Type 2 diabetes mellitus without complications: Secondary | ICD-10-CM | POA: Insufficient documentation

## 2014-06-14 DIAGNOSIS — Z8781 Personal history of (healed) traumatic fracture: Secondary | ICD-10-CM | POA: Insufficient documentation

## 2014-06-14 DIAGNOSIS — J189 Pneumonia, unspecified organism: Secondary | ICD-10-CM | POA: Diagnosis not present

## 2014-06-14 DIAGNOSIS — Z7902 Long term (current) use of antithrombotics/antiplatelets: Secondary | ICD-10-CM | POA: Diagnosis not present

## 2014-06-14 DIAGNOSIS — Z87891 Personal history of nicotine dependence: Secondary | ICD-10-CM | POA: Diagnosis not present

## 2014-06-14 DIAGNOSIS — R0602 Shortness of breath: Secondary | ICD-10-CM | POA: Diagnosis present

## 2014-06-14 LAB — CBC WITH DIFFERENTIAL/PLATELET
BASOS PCT: 1 % (ref 0–1)
Basophils Absolute: 0.1 10*3/uL (ref 0.0–0.1)
Eosinophils Absolute: 0.1 10*3/uL (ref 0.0–0.7)
Eosinophils Relative: 1 % (ref 0–5)
HCT: 39.5 % (ref 36.0–46.0)
HEMOGLOBIN: 12.9 g/dL (ref 12.0–15.0)
Lymphocytes Relative: 12 % (ref 12–46)
Lymphs Abs: 1.2 10*3/uL (ref 0.7–4.0)
MCH: 30.6 pg (ref 26.0–34.0)
MCHC: 32.7 g/dL (ref 30.0–36.0)
MCV: 93.6 fL (ref 78.0–100.0)
Monocytes Absolute: 1.1 10*3/uL — ABNORMAL HIGH (ref 0.1–1.0)
Monocytes Relative: 11 % (ref 3–12)
NEUTROS PCT: 75 % (ref 43–77)
Neutro Abs: 7.4 10*3/uL (ref 1.7–7.7)
PLATELETS: 480 10*3/uL — AB (ref 150–400)
RBC: 4.22 MIL/uL (ref 3.87–5.11)
RDW: 14.3 % (ref 11.5–15.5)
WBC: 9.9 10*3/uL (ref 4.0–10.5)

## 2014-06-14 LAB — COMPREHENSIVE METABOLIC PANEL
ALK PHOS: 46 U/L (ref 39–117)
ALT: 28 U/L (ref 0–35)
AST: 30 U/L (ref 0–37)
Albumin: 4.4 g/dL (ref 3.5–5.2)
Anion gap: 11 (ref 5–15)
BILIRUBIN TOTAL: 0.5 mg/dL (ref 0.3–1.2)
BUN: 21 mg/dL (ref 6–23)
CO2: 24 mmol/L (ref 19–32)
Calcium: 9.8 mg/dL (ref 8.4–10.5)
Chloride: 99 mEq/L (ref 96–112)
Creatinine, Ser: 1.29 mg/dL — ABNORMAL HIGH (ref 0.50–1.10)
GFR calc Af Amer: 48 mL/min — ABNORMAL LOW (ref >90)
GFR calc non Af Amer: 41 mL/min — ABNORMAL LOW (ref >90)
GLUCOSE: 126 mg/dL — AB (ref 70–99)
POTASSIUM: 4.7 mmol/L (ref 3.5–5.1)
SODIUM: 134 mmol/L — AB (ref 135–145)
Total Protein: 8 g/dL (ref 6.0–8.3)

## 2014-06-14 LAB — BRAIN NATRIURETIC PEPTIDE: B Natriuretic Peptide: 34 pg/mL (ref 0.0–100.0)

## 2014-06-14 MED ORDER — IPRATROPIUM-ALBUTEROL 0.5-2.5 (3) MG/3ML IN SOLN
3.0000 mL | Freq: Once | RESPIRATORY_TRACT | Status: AC
  Administered 2014-06-14: 3 mL via RESPIRATORY_TRACT
  Filled 2014-06-14: qty 3

## 2014-06-14 MED ORDER — AZITHROMYCIN 250 MG PO TABS
ORAL_TABLET | ORAL | Status: AC
  Filled 2014-06-14: qty 2

## 2014-06-14 MED ORDER — LORAZEPAM 2 MG/ML IJ SOLN
0.5000 mg | Freq: Once | INTRAMUSCULAR | Status: AC
  Administered 2014-06-14: 0.5 mg via INTRAVENOUS
  Filled 2014-06-14: qty 1

## 2014-06-14 MED ORDER — IPRATROPIUM BROMIDE 0.02 % IN SOLN: 0.5000 mg | Freq: Once | RESPIRATORY_TRACT | Status: DC

## 2014-06-14 MED ORDER — AZITHROMYCIN 250 MG PO TABS
500.0000 mg | ORAL_TABLET | Freq: Once | ORAL | Status: AC
  Administered 2014-06-14: 500 mg via ORAL

## 2014-06-14 MED ORDER — SODIUM CHLORIDE 0.9 % IV BOLUS (SEPSIS)
1000.0000 mL | Freq: Once | INTRAVENOUS | Status: AC
  Administered 2014-06-14: 1000 mL via INTRAVENOUS

## 2014-06-14 MED ORDER — ALBUTEROL SULFATE (2.5 MG/3ML) 0.083% IN NEBU: 5.0000 mg | INHALATION_SOLUTION | Freq: Once | RESPIRATORY_TRACT | Status: DC

## 2014-06-14 MED ORDER — HYDROCOD POLST-CHLORPHEN POLST 10-8 MG/5ML PO LQCR: 5.0000 mL | Freq: Two times a day (BID) | ORAL | Status: DC | PRN

## 2014-06-14 MED ORDER — AZITHROMYCIN 250 MG PO TABS: 250.0000 mg | ORAL_TABLET | Freq: Every day | ORAL | Status: DC

## 2014-06-14 MED ORDER — GUAIFENESIN-DM 100-10 MG/5ML PO SYRP
5.0000 mL | ORAL_SOLUTION | Freq: Once | ORAL | Status: AC
  Administered 2014-06-14: 5 mL via ORAL
  Filled 2014-06-14: qty 5

## 2014-06-14 NOTE — ED Notes (Signed)
Patient given discharge instruction, verbalized understand. IV removed, band aid applied. Patient ambulatory out of the department.  

## 2014-06-14 NOTE — Discharge Instructions (Signed)
Follow up with your md the end of the week

## 2014-06-14 NOTE — ED Provider Notes (Signed)
CSN: [id number]     Arrival date & time 06/14/14  1221 History  This chart was scribed for Melody Diego, MD by Stephania Fragmin, ED Scribe. This patient was seen in room APA12/APA12 and the patient's care was started at 1:57 PM.    Chief Complaint  Patient presents with  . Shortness of Breath   Patient is a 69 y.o. female presenting with shortness of breath. The history is provided by the patient. No language interpreter was used.  Shortness of Breath Severity:  Moderate Onset quality:  Gradual Duration:  1 week Timing:  Constant Progression:  Unchanged Chronicity:  New Context comment:  Pneumonia Relieved by:  Nothing Worsened by:  Nothing tried Ineffective treatments: antibiotics. Associated symptoms: cough, headaches and vomiting   Associated symptoms: no abdominal pain, no chest pain and no rash   Risk factors comment:  Recent pneumonia infection    HPI Comments: [name] is a 69 y.o. female with a recent diagnosis of pneumonia who presents to the Emergency Department complaining of SOB that has not improved since starting treatment 1 week ago. She complains of associated 1-2 episodes of vomiting per day and loss of appetite, stating that she hasn't eaten anything for over a week. Patient was diagnosed with pneumonia in her lower left lung 1 week ago and prescribed antibiotics once a day. Although she can't remember the name of the antibiotic, she states she took one pill of it the first day.    Past Medical History  Diagnosis Date  . Hypertension   . Dysrhythmia   . Sleep apnea     sleep study  oct 2012  . Anginal pain     sees Dr Einar Gip.   . Diabetes mellitus without complication     no meds -diet controlled  . Stroke 2011    [place]    . Peripheral vascular disease     legs  . GERD (gastroesophageal reflux disease)   . Arthritis     rheumatoid ..   . Fibromyalgia   . Liver disease 08- 2012    per Dr Dagmar Hait pt has enlarged liver  . Thyroid disease   .  Diverticulitis   . High cholesterol    Past Surgical History  Procedure Laterality Date  . Coronary artery bypass graft  1986  . Cardiac catheterization  1996  1986  . Eye surgery  2013    cat ext bilateral .  . Eye surgery  2002    laser  . Fracture surgery  -1988- 2007    rod right leg - right wrist plate  . Cholecystectomy  1996  . Pars plana vitrectomy  03/25/2012    Procedure: PARS PLANA VITRECTOMY WITH 25 GAUGE;  Surgeon: Hayden Pedro, MD;  Location: [facility];  Service: Ophthalmology;  Laterality: Right;  . Gas/fluid exchange  03/25/2012    Procedure: GAS/FLUID EXCHANGE;  Surgeon: Hayden Pedro, MD;  Location: [facility];  Service: Ophthalmology;  Laterality: Right;   Family History  Problem Relation Age of Onset  . Hypertension Daughter   . Rheum arthritis Maternal Grandmother    History  Substance Use Topics  . Smoking status: Former Smoker -- 0.25 packs/day for 5 years    Types: Cigarettes    Quit date: 06/11/1984  . Smokeless tobacco: Never Used  . Alcohol Use: 0.0 oz/week     Comment: occ   OB History    No data available     Review of Systems  Constitutional: Positive for appetite change. Negative for fatigue.  HENT: Negative for congestion, ear discharge and sinus pressure.   Eyes: Negative for discharge.  Respiratory: Positive for cough and shortness of breath.   Cardiovascular: Negative for chest pain.  Gastrointestinal: Positive for vomiting. Negative for abdominal pain and diarrhea.  Genitourinary: Negative for frequency and hematuria.  Musculoskeletal: Negative for back pain.  Skin: Negative for rash.  Neurological: Positive for headaches. Negative for seizures.  Psychiatric/Behavioral: Negative for hallucinations.      Allergies  Fentanyl; Lactose intolerance (gi); Butrans; Penicillins; and Simvastatin  Home Medications   Prior to Admission medications   Medication Sig Start Date End Date Taking? Authorizing Provider  clopidogrel (PLAVIX) 75  MG tablet Take 1 tablet (75 mg total) by mouth daily with breakfast. 06/23/13   Thurnell Lose, MD  diclofenac sodium (VOLTAREN) 1 % GEL Apply 2 g topically daily as needed (Pain).    Historical Provider, MD  escitalopram (LEXAPRO) 10 MG tablet Take 10 mg by mouth daily.    Historical Provider, MD  esomeprazole (NEXIUM) 40 MG capsule Take 40 mg by mouth daily.     Historical Provider, MD  estrogen, conjugated,-medroxyprogesterone (PREMPRO) 0.625-5 MG per tablet Take 1 tablet by mouth daily.    Historical Provider, MD  metoprolol succinate (TOPROL-XL) 25 MG 24 hr tablet Take 25 mg by mouth daily.    Historical Provider, MD  Naltrexone HCl POWD Take 5 mg by mouth daily. Has it compounded into tablet form    Historical Provider, MD  Olmesartan-Amlodipine-HCTZ (TRIBENZOR) 40-5-25 MG TABS Take 1 tablet by mouth daily.    Historical Provider, MD  pravastatin (PRAVACHOL) 40 MG tablet Take 40 mg by mouth at bedtime.    Historical Provider, MD  spironolactone (ALDACTONE) 25 MG tablet Take 25 mg by mouth daily.     Historical Provider, MD  Tafluprost (ZIOPTAN) 0.0015 % SOLN Apply 1 drop to eye daily. Both eyes.    Historical Provider, MD  temazepam (RESTORIL) 30 MG capsule Take 30 mg by mouth at bedtime.    Historical Provider, MD  Tofacitinib Citrate (XELJANZ) 5 MG TABS Take 5 mg by mouth 2 (two) times daily.    Historical Provider, MD   BP 136/55 mmHg  Pulse 88  Temp(Src) 98.2 F (36.8 C) (Oral)  Resp 20  Ht 5' 5.5" (1.664 m)  Wt 270 lb (122.471 kg)  BMI 44.23 kg/m2  SpO2 98% Physical Exam  Constitutional: She is oriented to person, place, and time. She appears well-developed. She appears distressed.  HENT:  Head: Normocephalic.  Eyes: Conjunctivae and EOM are normal. No scleral icterus.  Neck: Neck supple. No thyromegaly present.  Cardiovascular: Normal rate and regular rhythm.  Exam reveals no gallop and no friction rub.   No murmur heard. Pulmonary/Chest: No stridor. She has wheezes  (moderate wheezing bilaterally). She has no rales. She exhibits no tenderness.  Abdominal: She exhibits no distension. There is no tenderness. There is no rebound.  Musculoskeletal: Normal range of motion. She exhibits no edema.  Lymphadenopathy:    She has no cervical adenopathy.  Neurological: She is oriented to person, place, and time. She exhibits normal muscle tone. Coordination normal.  Skin: No rash noted. No erythema.  Psychiatric: She has a normal mood and affect. Her behavior is normal.  Nursing note and vitals reviewed.   ED Course  Procedures (including critical care time)  DIAGNOSTIC STUDIES: Oxygen Saturation is 98% on room air, normal by my interpretation.  COORDINATION OF CARE: 2:04 PM - Discussed treatment plan with pt at bedside which includes fluids and breathing treatment and pt agreed to plan.   Labs Review Labs Reviewed - No data to display  Imaging Review Dg Chest 2 View  06/14/2014   CLINICAL DATA:  History of LEFT lower lobe pneumonia with recent antibiotic treatment.  EXAM: CHEST  2 VIEW  COMPARISON:  No recent chest radiographs. Prior chest 03/25/2012. CT chest 06/22/2013.  FINDINGS: Cardiomegaly. Prior CABG. No mediastinal masses. Cholecystectomy. Both lungs are clear. The visualized skeletal structures are unremarkable.  IMPRESSION: Cardiomegaly.  No active disease.   Electronically Signed   By: Rolla Flatten M.D.   On: 06/14/2014 13:14     EKG Interpretation None      MDM   Final diagnoses:  Cough    Bronchitis,  zpak and tussionex    The chart was scribed for me under my direct supervision.  I personally performed the history, physical, and medical decision making and all procedures in the evaluation of this patient.Melody Diego, MD 06/14/14 [phone]

## 2014-06-14 NOTE — ED Notes (Addendum)
PT reports recent dx of left lower lobe pneumonia and started on antibiotics approx about a week ago. PT reports SOB on exertion and decreased appetite and reports not feeling any better since start of antibitoics. PT reports a productive yellow sputum cough.

## 2014-06-14 NOTE — ED Notes (Signed)
Pt has finished antibiotic, no appetite, sob, cough productive with yellow sputum, Headache

## 2014-07-09 DIAGNOSIS — I25719 Atherosclerosis of autologous vein coronary artery bypass graft(s) with unspecified angina pectoris: Secondary | ICD-10-CM | POA: Diagnosis not present

## 2014-07-09 DIAGNOSIS — G459 Transient cerebral ischemic attack, unspecified: Secondary | ICD-10-CM | POA: Diagnosis not present

## 2014-07-09 DIAGNOSIS — R05 Cough: Secondary | ICD-10-CM | POA: Diagnosis not present

## 2014-07-09 DIAGNOSIS — Z6841 Body Mass Index (BMI) 40.0 and over, adult: Secondary | ICD-10-CM | POA: Diagnosis not present

## 2014-07-09 DIAGNOSIS — J189 Pneumonia, unspecified organism: Secondary | ICD-10-CM | POA: Diagnosis not present

## 2014-07-09 DIAGNOSIS — R0600 Dyspnea, unspecified: Secondary | ICD-10-CM | POA: Diagnosis not present

## 2014-07-09 DIAGNOSIS — E039 Hypothyroidism, unspecified: Secondary | ICD-10-CM | POA: Diagnosis not present

## 2014-07-09 DIAGNOSIS — E118 Type 2 diabetes mellitus with unspecified complications: Secondary | ICD-10-CM | POA: Diagnosis not present

## 2014-07-09 DIAGNOSIS — M069 Rheumatoid arthritis, unspecified: Secondary | ICD-10-CM | POA: Diagnosis not present

## 2014-07-09 DIAGNOSIS — I10 Essential (primary) hypertension: Secondary | ICD-10-CM | POA: Diagnosis not present

## 2014-07-09 DIAGNOSIS — K219 Gastro-esophageal reflux disease without esophagitis: Secondary | ICD-10-CM | POA: Diagnosis not present

## 2014-07-09 DIAGNOSIS — Z1389 Encounter for screening for other disorder: Secondary | ICD-10-CM | POA: Diagnosis not present

## 2014-07-12 DIAGNOSIS — M797 Fibromyalgia: Secondary | ICD-10-CM | POA: Diagnosis not present

## 2014-07-12 DIAGNOSIS — M0579 Rheumatoid arthritis with rheumatoid factor of multiple sites without organ or systems involvement: Secondary | ICD-10-CM | POA: Diagnosis not present

## 2014-07-12 DIAGNOSIS — M5136 Other intervertebral disc degeneration, lumbar region: Secondary | ICD-10-CM | POA: Diagnosis not present

## 2014-07-15 DIAGNOSIS — R002 Palpitations: Secondary | ICD-10-CM | POA: Diagnosis not present

## 2014-07-15 DIAGNOSIS — I2510 Atherosclerotic heart disease of native coronary artery without angina pectoris: Secondary | ICD-10-CM | POA: Diagnosis not present

## 2014-07-15 DIAGNOSIS — G4733 Obstructive sleep apnea (adult) (pediatric): Secondary | ICD-10-CM | POA: Diagnosis not present

## 2014-07-22 DIAGNOSIS — J181 Lobar pneumonia, unspecified organism: Secondary | ICD-10-CM | POA: Diagnosis not present

## 2014-07-22 DIAGNOSIS — J029 Acute pharyngitis, unspecified: Secondary | ICD-10-CM | POA: Diagnosis not present

## 2014-10-08 ENCOUNTER — Ambulatory Visit: Payer: Medicare Other | Admitting: Neurology

## 2014-11-01 DIAGNOSIS — R5383 Other fatigue: Secondary | ICD-10-CM | POA: Diagnosis not present

## 2014-11-01 DIAGNOSIS — E559 Vitamin D deficiency, unspecified: Secondary | ICD-10-CM | POA: Diagnosis not present

## 2014-11-01 DIAGNOSIS — Z79899 Other long term (current) drug therapy: Secondary | ICD-10-CM | POA: Diagnosis not present

## 2014-11-01 DIAGNOSIS — I10 Essential (primary) hypertension: Secondary | ICD-10-CM | POA: Diagnosis not present

## 2014-11-01 DIAGNOSIS — M797 Fibromyalgia: Secondary | ICD-10-CM | POA: Diagnosis not present

## 2014-11-01 DIAGNOSIS — M899 Disorder of bone, unspecified: Secondary | ICD-10-CM | POA: Diagnosis not present

## 2014-11-01 DIAGNOSIS — K219 Gastro-esophageal reflux disease without esophagitis: Secondary | ICD-10-CM | POA: Diagnosis not present

## 2014-11-01 DIAGNOSIS — E785 Hyperlipidemia, unspecified: Secondary | ICD-10-CM | POA: Diagnosis not present

## 2014-11-04 ENCOUNTER — Other Ambulatory Visit (HOSPITAL_COMMUNITY): Payer: Self-pay | Admitting: Internal Medicine

## 2014-11-04 DIAGNOSIS — Z1231 Encounter for screening mammogram for malignant neoplasm of breast: Secondary | ICD-10-CM

## 2014-11-12 DIAGNOSIS — M5136 Other intervertebral disc degeneration, lumbar region: Secondary | ICD-10-CM | POA: Diagnosis not present

## 2014-11-12 DIAGNOSIS — M0579 Rheumatoid arthritis with rheumatoid factor of multiple sites without organ or systems involvement: Secondary | ICD-10-CM | POA: Diagnosis not present

## 2014-11-12 DIAGNOSIS — M797 Fibromyalgia: Secondary | ICD-10-CM | POA: Diagnosis not present

## 2014-11-18 ENCOUNTER — Ambulatory Visit (HOSPITAL_COMMUNITY)
Admission: RE | Admit: 2014-11-18 | Discharge: 2014-11-18 | Disposition: A | Discharge status: Home / Self Care | Payer: Medicare Other | Source: Ambulatory Visit | Attending: Internal Medicine | Admitting: Internal Medicine

## 2014-11-18 DIAGNOSIS — Z1231 Encounter for screening mammogram for malignant neoplasm of breast: Secondary | ICD-10-CM | POA: Insufficient documentation

## 2014-11-24 DIAGNOSIS — I10 Essential (primary) hypertension: Secondary | ICD-10-CM | POA: Diagnosis not present

## 2014-11-24 DIAGNOSIS — E119 Type 2 diabetes mellitus without complications: Secondary | ICD-10-CM | POA: Diagnosis not present

## 2014-11-24 DIAGNOSIS — E785 Hyperlipidemia, unspecified: Secondary | ICD-10-CM | POA: Diagnosis not present

## 2014-11-24 DIAGNOSIS — Z01419 Encounter for gynecological examination (general) (routine) without abnormal findings: Secondary | ICD-10-CM | POA: Diagnosis not present

## 2014-11-24 DIAGNOSIS — E559 Vitamin D deficiency, unspecified: Secondary | ICD-10-CM | POA: Diagnosis not present

## 2014-12-21 ENCOUNTER — Other Ambulatory Visit: Payer: Self-pay | Admitting: Internal Medicine

## 2014-12-21 DIAGNOSIS — R928 Other abnormal and inconclusive findings on diagnostic imaging of breast: Secondary | ICD-10-CM

## 2014-12-31 ENCOUNTER — Ambulatory Visit (INDEPENDENT_AMBULATORY_CARE_PROVIDER_SITE_OTHER): Payer: Medicare Other | Admitting: Neurology

## 2014-12-31 ENCOUNTER — Encounter: Payer: Self-pay | Admitting: Neurology

## 2014-12-31 VITALS — BP 130/74 | HR 81 | Ht 66.0 in | Wt 276.0 lb

## 2014-12-31 DIAGNOSIS — I6529 Occlusion and stenosis of unspecified carotid artery: Secondary | ICD-10-CM

## 2014-12-31 MED ORDER — CLOPIDOGREL BISULFATE 75 MG PO TABS: 75.0000 mg | ORAL_TABLET | Freq: Every day | ORAL | Status: DC

## 2014-12-31 NOTE — Progress Notes (Signed)
[facility] [address] [place]. [place] [address] [phone]       OFFICE FOLLOW-UP NOTE  [name] Date of Birth:  1946/05/08 Medical Record Number:  [id number]   HPI: 60 year Caucasian lady seen today for first office followup visit following hospital consideration for TIA and 06/20/13. She presented with sudden onset of left hand weakness numbness and inability to hold objects as well as tingling sensation. And was flopping around. She could not reach behind her and hold the telephone her hand. These symptoms lasted only 3-4 minutes and resolved completely. She took 4 tablets of baby aspirin and came to the emergency room. She had a somewhat similar episode 5 years ago in [facility] and at that time evaluation including MRI 1 revealing. She had been taking aspirin 81 mg daily since then. Following this recent episode she was switched to Plavix. Rest of her hospital evaluation was fairly unremarkable including MRI scan of the brain which showed no acute infarct MRA showed no intracranial large vessel stenosis carotid ultrasound showed no extracranial stenosis lipid profile was normal hemoglobin A1c was borderline at 6.7. Urine drug screen was negative. Transthoracic echo showed normal ejection fraction without corrective source of embolism. EKG showed sinus rhythm and telemetry monitoring did not reveal any arrhythmia. Urine drug screen was normal. She states she's done well since discharge according to without bleeding, bruising or other side effects we did she is not been able to exercise a lot because of severe neck and back pain. She has in fact seen neurosurgeon who recommends next is she the patient is reluctant to hold Plavix for fear of recurrent TIA/stroke. She states her sugars have been well controlled with fasting sugars being in the 120s. Blood pressure is quite good and today it is 1236/69 office. She does use CPAP every night. Update  03/24/2014 ; she returns for followup after her last visit with me 6 months ago. She continues to do well from a neurovascular standpoint without TIA or stroke symptoms. She had some palpitations and underwent a 25-week-old the monitor which showed only some couplet beats. She was on Toprol which was discontinued the palpitations were not helped. She had been started by her rheumatologist on naltrexone which she discontinued and seems to have helped the palpitations. She has also been having intermittent occipital headache and neck pain for which she was seen in the ER and was treated with nonsteroidal and inflammatory drugs with modest relief. She still has some intermittent pain. She has some tender points in the back of her head. She has recently been diagnosed with mild anemia but her rheumatologist and is planning to work up for that. She had a stool exam today and plans to see a gastroenterologist for endoscopy procedures. She has also been advised epidural steroid injection in her neck and she is considering that as well by her neurosurgeon. She also doesn't complain of tingling and numbness in her hands particularly night she wakes up with these symptoms and she gets partial relief by shaking her hands. She has not formally been diagnosed with carpal tunnel. She is wondering to try new medications or undergo EMG nerve conduction testing. She has questions about holding Plavix for her pending endoscopy procedures and epidural injections. Update 12/31/2014 : She returns for follow-up after last visit with Charlott Holler in November 2015. She has not had any recurrent TIA or stroke symptoms now for almost 2 years. She remains on Plavix which is  tolerating well without bleeding or bruising. She states her last lipid profile was checked a month ago and was fine. She remains on protocol which is tolerating well without myalgias or arthralgias. She does get some occasional night cramps in her legs. She has recently just  changed her primary care physician to Dr. Edwyna Ready call in [place]. She was found to have low vitamin D levels and is currently being treated for it. She has been intolerant to using her CPAP as well as several different mass that she tried for sleep apnea and states that she is sleeping much more comfortably without them. She was recently diagnosed with diabetes and has started metformin and follow-up hemoglobin A1c is pending. She is not exercising as much as she would like or watching her diet and has not lost weight but plans to do so. ROS:   14 system review of systems is positive for fatigue, back pain, muscle cramps, neck pain and all other systems negative PMH:  Past Medical History  Diagnosis Date  . Hypertension   . Dysrhythmia   . Sleep apnea     sleep study  oct 2012  . Anginal pain     sees Dr Einar Gip.   . Diabetes mellitus without complication     no meds -diet controlled  . Stroke 2011    [place]    . Peripheral vascular disease     legs  . GERD (gastroesophageal reflux disease)   . Arthritis     rheumatoid ..   . Fibromyalgia   . Liver disease 08- 2012    per Dr Dagmar Hait pt has enlarged liver  . Thyroid disease   . Diverticulitis   . High cholesterol     Social History:  History   Social History  . Marital Status: Married    Spouse Name: Herbie Baltimore  . Number of Children: 2  . Years of Education: 12   Occupational History  . Retired    Social History Main Topics  . Smoking status: Former Smoker -- 0.25 packs/day for 5 years    Types: Cigarettes    Quit date: 06/11/1984  . Smokeless tobacco: Never Used  . Alcohol Use: 0.0 oz/week     Comment: occ  . Drug Use: No  . Sexual Activity: Yes   Other Topics Concern  . Not on file   Social History Narrative    Medications:   Current Outpatient Prescriptions on File Prior to Visit  Medication Sig Dispense Refill  . diclofenac sodium (VOLTAREN) 1 % GEL Apply 2 g topically daily as needed (Pain).    Marland Kitchen  escitalopram (LEXAPRO) 10 MG tablet Take 10 mg by mouth daily.    Marland Kitchen esomeprazole (NEXIUM) 40 MG capsule Take 40 mg by mouth daily.     Marland Kitchen estrogen, conjugated,-medroxyprogesterone (PREMPRO) 0.625-5 MG per tablet Take 1 tablet by mouth daily.    . Olmesartan-Amlodipine-HCTZ (TRIBENZOR) 40-5-25 MG TABS Take 1 tablet by mouth daily.    Marland Kitchen oxyCODONE-acetaminophen (PERCOCET) 7.5-325 MG per tablet Take 1 tablet by mouth every 6 (six) hours as needed for pain.    . pravastatin (PRAVACHOL) 40 MG tablet Take 40 mg by mouth at bedtime.    Marland Kitchen spironolactone (ALDACTONE) 25 MG tablet Take 25 mg by mouth daily.     . Tafluprost (ZIOPTAN) 0.0015 % SOLN Apply 1 drop to eye daily. Both eyes.    Marland Kitchen temazepam (RESTORIL) 30 MG capsule Take 30 mg by mouth at bedtime.    Marland Kitchen  levothyroxine (SYNTHROID, LEVOTHROID) 50 MCG tablet Take 50 mcg by mouth daily before breakfast.    . Tofacitinib Citrate (XELJANZ) 5 MG TABS Take 5 mg by mouth 2 (two) times daily.     No current facility-administered medications on file prior to visit.    Allergies:   Allergies  Allergen Reactions  . Fentanyl Anaphylaxis and Shortness Of Breath  . Cefdinir   . Lactose Intolerance (Gi) Other (See Comments)    G.I. Upset  . Butrans [Buprenorphine] Rash and Other (See Comments)    Infected skin underneath application  . Penicillins Rash    Facial rash  . Simvastatin Rash    Physical Exam General: Obese middle-age Caucasian lady seated, in no evident distress Head: head normocephalic and atraumatic.   Neck: supple with no carotid or supraclavicular bruits Cardiovascular: regular rate and rhythm, no murmurs Musculoskeletal: no deformity. Tender points bilateral occipital grooves Skin:  no rash/petichiae Vascular:  Normal pulses all extremities Filed Vitals:   12/31/14 0947  BP: 130/74  Pulse: 81   Neurologic Exam Mental Status: Awake and fully alert. Oriented to place and time. Recent and remote memory intact. Attention span,  concentration and fund of knowledge appropriate. Mood and affect appropriate.  Cranial Nerves: Fundoscopic exam not done  . Pupils equal, briskly reactive to light. Extraocular movements full without nystagmus. Visual fields full to confrontation. Hearing intact. Facial sensation intact. Face, tongue, palate moves normally and symmetrically.  Motor: Normal bulk and tone. Normal strength in all tested extremity muscles. Sensory.: intact to touch and pinprick and vibratory sensation. Positive Tinel`s sign at right wrist. Coordination: Rapid alternating movements normal in all extremities. Finger-to-nose and heel-to-shin performed accurately bilaterally. Gait and Station: Arises from chair without difficulty. Stance is normal. Gait demonstrates normal stride length and balance . Unable to heel, toe and tandem walk without difficulty.  Reflexes: 1+ and symmetric. Toes downgoing.     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    PLAN:  I had a long d/w patient about her remote TIA, risk for recurrent stroke/TIAs, personally independently reviewed imaging studies and stroke evaluation results and answered questions.Continue Plavix  for secondary stroke prevention and maintain strict control of hypertension with blood pressure goal below 130/90, diabetes with hemoglobin A1c goal below 6.5% and lipids with LDL cholesterol goal below 100 mg/dL. I also advised the patient to eat a healthy diet with plenty of whole grains, cereals, fruits and vegetables, exercise regularly and maintain ideal body weight. She was counseled to be compliant with her CPAP but she states that she is tried several different mask and CPAP and his better off without it. She was given a given a refill for Plavix and we will check screening carotid ultrasound study. No routine  follow-up appointment is necessary with me. Followup in the future with me only as needed  Antony Contras, MD      Note: This document was prepared with digital dictation and possible smart phrase technology. Any transcriptional errors that result from this process are unintentional

## 2014-12-31 NOTE — Patient Instructions (Signed)
I had a long d/w patient about her remote TIA, risk for recurrent stroke/TIAs, personally independently reviewed imaging studies and stroke evaluation results and answered questions.Continue Plavix  for secondary stroke prevention and maintain strict control of hypertension with blood pressure goal below 130/90, diabetes with hemoglobin A1c goal below 6.5% and lipids with LDL cholesterol goal below 100 mg/dL. I also advised the patient to eat a healthy diet with plenty of whole grains, cereals, fruits and vegetables, exercise regularly and maintain ideal body weight. She was counseled to be compliant with her CPAP but she states that she is tried several different mask and CPAP and his better off without it. She was given a given a refill for Plavix and we will check screening carotid ultrasound study. No routine follow-up appointment is necessary with me. Followup in the future with me only as needed

## 2015-01-06 ENCOUNTER — Other Ambulatory Visit: Payer: Medicare Other

## 2015-01-19 DIAGNOSIS — H4011X2 Primary open-angle glaucoma, moderate stage: Secondary | ICD-10-CM | POA: Diagnosis not present

## 2015-01-19 DIAGNOSIS — E1165 Type 2 diabetes mellitus with hyperglycemia: Secondary | ICD-10-CM | POA: Diagnosis not present

## 2015-01-21 DIAGNOSIS — M7741 Metatarsalgia, right foot: Secondary | ICD-10-CM | POA: Diagnosis not present

## 2015-01-21 DIAGNOSIS — M722 Plantar fascial fibromatosis: Secondary | ICD-10-CM | POA: Diagnosis not present

## 2015-01-24 ENCOUNTER — Ambulatory Visit (INDEPENDENT_AMBULATORY_CARE_PROVIDER_SITE_OTHER): Payer: Medicare Other | Admitting: Orthopedic Surgery

## 2015-01-24 ENCOUNTER — Encounter: Payer: Self-pay | Admitting: Orthopedic Surgery

## 2015-01-24 ENCOUNTER — Ambulatory Visit (INDEPENDENT_AMBULATORY_CARE_PROVIDER_SITE_OTHER): Payer: Medicare Other

## 2015-01-24 VITALS — BP 137/72 | Ht 66.0 in | Wt 276.0 lb

## 2015-01-24 DIAGNOSIS — S83207A Unspecified tear of unspecified meniscus, current injury, left knee, initial encounter: Secondary | ICD-10-CM | POA: Diagnosis not present

## 2015-01-24 DIAGNOSIS — M25562 Pain in left knee: Secondary | ICD-10-CM

## 2015-01-24 DIAGNOSIS — I6529 Occlusion and stenosis of unspecified carotid artery: Secondary | ICD-10-CM

## 2015-01-24 DIAGNOSIS — M1712 Unilateral primary osteoarthritis, left knee: Secondary | ICD-10-CM | POA: Diagnosis not present

## 2015-01-24 DIAGNOSIS — M069 Rheumatoid arthritis, unspecified: Secondary | ICD-10-CM

## 2015-01-24 NOTE — Progress Notes (Signed)
Patient ID: [name], female   DOB: 11-10-1945, 69 y.o.   MRN: [id number]  Chief Complaint  Patient presents with  . Knee Pain    left knee pain x 6 months, no known injury, REFER Z HALL    HPI [name] is a 69 y.o. female.  The patient says she carries a diagnosis of fibromyalgia and of rheumatoid arthritis and presents with history of bypass grafting of the heart, cholecystectomy, IM nailing right tibia, ORIF left wrist for fracture. She is on Plavix and Nexium and hydrocodone 10 mg  The patient reports a six-month history of popping in her left knee with occasional catching she's had 2 episodes with severe pain. She also complains of severe pain when she climbs or negotiates steps. Regular ambulation doesn't seem to bother her unless she pivots or twists the left knee.  System review fatigue hearing loss sinus problem claustrophobia seasonal allergies lightheadedness back pain chronic stiff joints. Joint pain. Remaining systems are normal  She is also diabetic  Review of Systems Review of Systems  Past Medical History  Diagnosis Date  . Hypertension   . Dysrhythmia   . Sleep apnea     sleep study  oct 2012  . Anginal pain     sees Dr Einar Gip.   . Diabetes mellitus without complication     no meds -diet controlled  . Stroke 2011    [place]    . Peripheral vascular disease     legs  . GERD (gastroesophageal reflux disease)   . Arthritis     rheumatoid ..   . Fibromyalgia   . Liver disease 08- 2012    per Dr Dagmar Hait pt has enlarged liver  . Thyroid disease   . Diverticulitis   . High cholesterol     Past Surgical History  Procedure Laterality Date  . Coronary artery bypass graft  1986  . Cardiac catheterization  1996  1986  . Eye surgery  2013    cat ext bilateral .  . Eye surgery  2002    laser  . Fracture surgery  -1988- 2007    rod right leg - right wrist plate  . Cholecystectomy  1996  . Pars plana vitrectomy  03/25/2012    Procedure: PARS  PLANA VITRECTOMY WITH 25 GAUGE;  Surgeon: Hayden Pedro, MD;  Location: [place];  Service: Ophthalmology;  Laterality: Right;  . Gas/fluid exchange  03/25/2012    Procedure: GAS/FLUID EXCHANGE;  Surgeon: Hayden Pedro, MD;  Location: [place];  Service: Ophthalmology;  Laterality: Right;    Family History  Problem Relation Age of Onset  . Hypertension Daughter   . Rheum arthritis Maternal Grandmother     Social History Social History  Substance Use Topics  . Smoking status: Former Smoker -- 0.25 packs/day for 5 years    Types: Cigarettes    Quit date: 06/11/1984  . Smokeless tobacco: Never Used  . Alcohol Use: 0.0 oz/week     Comment: occ    Allergies  Allergen Reactions  . Fentanyl Anaphylaxis and Shortness Of Breath  . Cefdinir   . Lactose Intolerance (Gi) Other (See Comments)    G.I. Upset  . Butrans [Buprenorphine] Rash and Other (See Comments)    Infected skin underneath application  . Penicillins Rash    Facial rash  . Simvastatin Rash    Current Outpatient Prescriptions  Medication Sig Dispense Refill  . Blood Glucose Monitoring Suppl (FREESTYLE LITE) DEVI See admin  instructions.  0  . clopidogrel (PLAVIX) 75 MG tablet Take 1 tablet (75 mg total) by mouth daily with breakfast. 90 tablet 6  . diclofenac sodium (VOLTAREN) 1 % GEL Apply 2 g topically daily as needed (Pain).    Marland Kitchen escitalopram (LEXAPRO) 10 MG tablet Take 10 mg by mouth daily.    Marland Kitchen esomeprazole (NEXIUM) 40 MG capsule Take 40 mg by mouth daily.     Marland Kitchen estrogen, conjugated,-medroxyprogesterone (PREMPRO) 0.625-5 MG per tablet Take 1 tablet by mouth daily.    Marland Kitchen levothyroxine (SYNTHROID, LEVOTHROID) 50 MCG tablet Take 50 mcg by mouth daily before breakfast.    . Olmesartan-Amlodipine-HCTZ (TRIBENZOR) 40-5-25 MG TABS Take 1 tablet by mouth daily.    Marland Kitchen oxyCODONE-acetaminophen (PERCOCET) 7.5-325 MG per tablet Take 1 tablet by mouth every 6 (six) hours as needed for pain.    . pravastatin (PRAVACHOL) 40 MG tablet  Take 40 mg by mouth at bedtime.    Marland Kitchen spironolactone (ALDACTONE) 25 MG tablet Take 25 mg by mouth daily.     . Tafluprost (ZIOPTAN) 0.0015 % SOLN Apply 1 drop to eye daily. Both eyes.    Marland Kitchen temazepam (RESTORIL) 30 MG capsule Take 30 mg by mouth at bedtime.    . Tofacitinib Citrate (XELJANZ) 5 MG TABS Take 5 mg by mouth 2 (two) times daily.     No current facility-administered medications for this visit.       Physical Exam Physical Exam Blood pressure 137/72, height 5\' 6"  (1.676 m), weight 276 lb (125.193 kg). Well-developed well-nourished overweight female with body mass index Body mass index is 44.57 kg/(m^2). Oriented 3 mood pleasant  She says she can do physical therapy she says she is claustrophobic she says that her physical therapy don't  She comes in without support of gait  She has medial joint line tenderness. Her knee flexion arc is 120 with flexion contracture 5. Knee stable. McMurray sign positive. Tenderness along the medial joint line and medial femoral condyle. Neurovascular exam intact skin normal.   Data Reviewed Imaging studies show moderate arthritis of the medial compartment of the left knee with severe arthritis of the patellofemoral joint this is marked by joint space narrowing and not the typical findings of osteoarthritis which would include bone spurs and cyst formation.  Assessment    Her clinical exam suggests meniscal tear her history suggests patellofemoral arthritis  Recommend MRI to differentiate meniscal tear versus arthritis  She will come back for her result review and possible shot    Plan    Mri knee        Arther Abbott 01/24/2015, 2:41 PM

## 2015-01-24 NOTE — Patient Instructions (Signed)
We will schedule MRI for you and call you with appt and results 

## 2015-01-25 DIAGNOSIS — M150 Primary generalized (osteo)arthritis: Secondary | ICD-10-CM | POA: Diagnosis not present

## 2015-01-25 DIAGNOSIS — M797 Fibromyalgia: Secondary | ICD-10-CM | POA: Diagnosis not present

## 2015-01-25 DIAGNOSIS — M0589 Other rheumatoid arthritis with rheumatoid factor of multiple sites: Secondary | ICD-10-CM | POA: Diagnosis not present

## 2015-02-09 ENCOUNTER — Ambulatory Visit (INDEPENDENT_AMBULATORY_CARE_PROVIDER_SITE_OTHER): Payer: Medicare Other

## 2015-02-09 DIAGNOSIS — I6529 Occlusion and stenosis of unspecified carotid artery: Secondary | ICD-10-CM | POA: Diagnosis not present

## 2015-02-11 ENCOUNTER — Ambulatory Visit
Admission: RE | Admit: 2015-02-11 | Discharge: 2015-02-11 | Disposition: A | Discharge status: Home / Self Care | Payer: Medicare Other | Source: Ambulatory Visit | Attending: Orthopedic Surgery | Admitting: Orthopedic Surgery

## 2015-02-11 DIAGNOSIS — S83207A Unspecified tear of unspecified meniscus, current injury, left knee, initial encounter: Secondary | ICD-10-CM

## 2015-02-11 DIAGNOSIS — M25562 Pain in left knee: Secondary | ICD-10-CM | POA: Diagnosis not present

## 2015-02-21 ENCOUNTER — Telehealth: Payer: Self-pay | Admitting: Orthopedic Surgery

## 2015-02-21 NOTE — Telephone Encounter (Signed)
MESSAGE LEFT TO CALL BACK TO ARRANGE SURGERY ARTHROSCOPY OF THE KNEE  IMPRESSION: 1. Lateral meniscal tears. 2. Intact ligamentous structures and no acute bony findings. 3. 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 4. Small joint effusion.

## 2015-02-22 ENCOUNTER — Telehealth: Payer: Self-pay | Admitting: Orthopedic Surgery

## 2015-02-22 NOTE — Telephone Encounter (Signed)
WE CAN DISCUSS THEN

## 2015-02-22 NOTE — Telephone Encounter (Signed)
Dr. Aline Brochure [name] received your message regarding her MRI results, I have her scheduled for an ov on Monday 03/06/14, but she would like to speak to you before then because she is concerned about some of her medical history you mentioned on her voice message, please advise?

## 2015-03-01 ENCOUNTER — Telehealth: Payer: Self-pay | Admitting: *Deleted

## 2015-03-01 NOTE — Telephone Encounter (Signed)
Spoke w/ pt about normal carotid ultrasound study. Pt verbalized understanding.

## 2015-03-01 NOTE — Telephone Encounter (Signed)
-----   Message from Garvin Fila, MD sent at 02/28/2015  5:46 PM EDT ----- Mitchell Heir inform the patient that carotid ultrasound study done in our office was normal

## 2015-03-04 DIAGNOSIS — M722 Plantar fascial fibromatosis: Secondary | ICD-10-CM | POA: Diagnosis not present

## 2015-03-07 ENCOUNTER — Encounter: Payer: Self-pay | Admitting: Orthopedic Surgery

## 2015-03-07 ENCOUNTER — Ambulatory Visit (INDEPENDENT_AMBULATORY_CARE_PROVIDER_SITE_OTHER): Payer: Medicare Other | Admitting: Orthopedic Surgery

## 2015-03-07 VITALS — BP 131/78 | Ht 66.0 in | Wt 276.0 lb

## 2015-03-07 DIAGNOSIS — S83207D Unspecified tear of unspecified meniscus, current injury, left knee, subsequent encounter: Secondary | ICD-10-CM

## 2015-03-07 DIAGNOSIS — M069 Rheumatoid arthritis, unspecified: Secondary | ICD-10-CM

## 2015-03-07 DIAGNOSIS — I6529 Occlusion and stenosis of unspecified carotid artery: Secondary | ICD-10-CM

## 2015-03-07 NOTE — Progress Notes (Signed)
Patient ID: [name], female   DOB: 1946/03/02, 69 y.o.   MRN: [id number]  Follow up visit  Chief Complaint  Patient presents with  . Follow-up    Recheck left knee.    BP 131/78 mmHg  Ht 5\' 6"  (1.676 m)  Wt 276 lb (125.193 kg)  BMI 44.57 kg/m2  No diagnosis found.  Preop evaluation and discussion of the patient's ongoing condition regarding her left knee. She had an MRI which showed she had torn lateral meniscus and some arthritis in all 3 compartments. However at this point the patient is not having any pain in her knee just popping and she wishes to delay surgery. We showed her her imaging studies went over the images with her discussed the areas in her medical record she says she was never told she had any liver disease and she does not have any ongoing angina although she did have a bypass graft  Time spent more than 15 but less than 20 minutes

## 2015-03-09 DIAGNOSIS — E782 Mixed hyperlipidemia: Secondary | ICD-10-CM | POA: Diagnosis not present

## 2015-03-09 DIAGNOSIS — I10 Essential (primary) hypertension: Secondary | ICD-10-CM | POA: Diagnosis not present

## 2015-03-09 DIAGNOSIS — E119 Type 2 diabetes mellitus without complications: Secondary | ICD-10-CM | POA: Diagnosis not present

## 2015-03-09 DIAGNOSIS — E559 Vitamin D deficiency, unspecified: Secondary | ICD-10-CM | POA: Diagnosis not present

## 2015-04-01 DIAGNOSIS — N390 Urinary tract infection, site not specified: Secondary | ICD-10-CM | POA: Diagnosis not present

## 2015-04-29 DIAGNOSIS — R319 Hematuria, unspecified: Secondary | ICD-10-CM | POA: Diagnosis not present

## 2015-05-13 DIAGNOSIS — M0589 Other rheumatoid arthritis with rheumatoid factor of multiple sites: Secondary | ICD-10-CM | POA: Diagnosis not present

## 2015-05-13 DIAGNOSIS — M150 Primary generalized (osteo)arthritis: Secondary | ICD-10-CM | POA: Diagnosis not present

## 2015-05-13 DIAGNOSIS — M797 Fibromyalgia: Secondary | ICD-10-CM | POA: Diagnosis not present

## 2015-05-13 DIAGNOSIS — R5383 Other fatigue: Secondary | ICD-10-CM | POA: Diagnosis not present

## 2015-05-27 DIAGNOSIS — N390 Urinary tract infection, site not specified: Secondary | ICD-10-CM | POA: Diagnosis not present

## 2015-06-15 DIAGNOSIS — M25531 Pain in right wrist: Secondary | ICD-10-CM | POA: Diagnosis not present

## 2015-06-29 DIAGNOSIS — N390 Urinary tract infection, site not specified: Secondary | ICD-10-CM | POA: Diagnosis not present

## 2015-06-30 DIAGNOSIS — N390 Urinary tract infection, site not specified: Secondary | ICD-10-CM | POA: Diagnosis not present

## 2015-07-05 DIAGNOSIS — M67431 Ganglion, right wrist: Secondary | ICD-10-CM | POA: Diagnosis not present

## 2015-07-08 ENCOUNTER — Other Ambulatory Visit (HOSPITAL_COMMUNITY): Payer: Self-pay | Admitting: Internal Medicine

## 2015-07-08 DIAGNOSIS — R509 Fever, unspecified: Secondary | ICD-10-CM

## 2015-07-08 DIAGNOSIS — R109 Unspecified abdominal pain: Secondary | ICD-10-CM

## 2015-07-11 ENCOUNTER — Ambulatory Visit (HOSPITAL_COMMUNITY): Admission: RE | Admit: 2015-07-11 | Payer: Medicare Other | Source: Ambulatory Visit

## 2015-07-14 ENCOUNTER — Other Ambulatory Visit (HOSPITAL_COMMUNITY): Payer: Medicare Other

## 2015-07-15 ENCOUNTER — Encounter (HOSPITAL_COMMUNITY): Payer: Self-pay

## 2015-07-15 ENCOUNTER — Ambulatory Visit (HOSPITAL_COMMUNITY)
Admission: RE | Admit: 2015-07-15 | Discharge: 2015-07-15 | Disposition: A | Discharge status: Home / Self Care | Payer: Medicare Other | Source: Ambulatory Visit | Attending: Internal Medicine | Admitting: Internal Medicine

## 2015-07-15 DIAGNOSIS — N200 Calculus of kidney: Secondary | ICD-10-CM | POA: Diagnosis not present

## 2015-07-15 DIAGNOSIS — R109 Unspecified abdominal pain: Secondary | ICD-10-CM | POA: Diagnosis not present

## 2015-07-15 DIAGNOSIS — K760 Fatty (change of) liver, not elsewhere classified: Secondary | ICD-10-CM | POA: Diagnosis not present

## 2015-07-15 DIAGNOSIS — I7090 Unspecified atherosclerosis: Secondary | ICD-10-CM | POA: Diagnosis not present

## 2015-07-15 DIAGNOSIS — R509 Fever, unspecified: Secondary | ICD-10-CM

## 2015-07-15 DIAGNOSIS — K5730 Diverticulosis of large intestine without perforation or abscess without bleeding: Secondary | ICD-10-CM | POA: Insufficient documentation

## 2015-07-15 DIAGNOSIS — J479 Bronchiectasis, uncomplicated: Secondary | ICD-10-CM | POA: Diagnosis not present

## 2015-07-15 MED ORDER — IOHEXOL 300 MG/ML  SOLN
100.0000 mL | Freq: Once | INTRAMUSCULAR | Status: AC | PRN
  Administered 2015-07-15: 100 mL via INTRAVENOUS

## 2015-07-22 DIAGNOSIS — I2510 Atherosclerotic heart disease of native coronary artery without angina pectoris: Secondary | ICD-10-CM | POA: Diagnosis not present

## 2015-07-22 DIAGNOSIS — G4733 Obstructive sleep apnea (adult) (pediatric): Secondary | ICD-10-CM | POA: Diagnosis not present

## 2015-07-22 DIAGNOSIS — R002 Palpitations: Secondary | ICD-10-CM | POA: Diagnosis not present

## 2015-07-22 DIAGNOSIS — E1122 Type 2 diabetes mellitus with diabetic chronic kidney disease: Secondary | ICD-10-CM | POA: Diagnosis not present

## 2015-08-03 DIAGNOSIS — E119 Type 2 diabetes mellitus without complications: Secondary | ICD-10-CM | POA: Diagnosis not present

## 2015-08-03 DIAGNOSIS — I10 Essential (primary) hypertension: Secondary | ICD-10-CM | POA: Diagnosis not present

## 2015-08-03 DIAGNOSIS — E559 Vitamin D deficiency, unspecified: Secondary | ICD-10-CM | POA: Diagnosis not present

## 2015-08-05 DIAGNOSIS — E782 Mixed hyperlipidemia: Secondary | ICD-10-CM | POA: Diagnosis not present

## 2015-08-05 DIAGNOSIS — M069 Rheumatoid arthritis, unspecified: Secondary | ICD-10-CM | POA: Diagnosis not present

## 2015-08-05 DIAGNOSIS — E119 Type 2 diabetes mellitus without complications: Secondary | ICD-10-CM | POA: Diagnosis not present

## 2015-08-05 DIAGNOSIS — K219 Gastro-esophageal reflux disease without esophagitis: Secondary | ICD-10-CM | POA: Diagnosis not present

## 2015-08-05 DIAGNOSIS — I10 Essential (primary) hypertension: Secondary | ICD-10-CM | POA: Diagnosis not present

## 2015-08-05 DIAGNOSIS — M797 Fibromyalgia: Secondary | ICD-10-CM | POA: Diagnosis not present

## 2015-08-05 DIAGNOSIS — E559 Vitamin D deficiency, unspecified: Secondary | ICD-10-CM | POA: Diagnosis not present

## 2015-08-11 DIAGNOSIS — M150 Primary generalized (osteo)arthritis: Secondary | ICD-10-CM | POA: Diagnosis not present

## 2015-08-11 DIAGNOSIS — M797 Fibromyalgia: Secondary | ICD-10-CM | POA: Diagnosis not present

## 2015-08-11 DIAGNOSIS — M0589 Other rheumatoid arthritis with rheumatoid factor of multiple sites: Secondary | ICD-10-CM | POA: Diagnosis not present

## 2015-08-11 DIAGNOSIS — N183 Chronic kidney disease, stage 3 (moderate): Secondary | ICD-10-CM | POA: Diagnosis not present

## 2015-08-12 ENCOUNTER — Ambulatory Visit (INDEPENDENT_AMBULATORY_CARE_PROVIDER_SITE_OTHER): Payer: Medicare Other | Admitting: Urology

## 2015-08-12 DIAGNOSIS — N390 Urinary tract infection, site not specified: Secondary | ICD-10-CM

## 2015-08-29 DIAGNOSIS — J060 Acute laryngopharyngitis: Secondary | ICD-10-CM | POA: Diagnosis not present

## 2015-08-29 DIAGNOSIS — R05 Cough: Secondary | ICD-10-CM | POA: Diagnosis not present

## 2015-08-30 DIAGNOSIS — H04123 Dry eye syndrome of bilateral lacrimal glands: Secondary | ICD-10-CM | POA: Diagnosis not present

## 2015-08-30 DIAGNOSIS — H348112 Central retinal vein occlusion, right eye, stable: Secondary | ICD-10-CM | POA: Diagnosis not present

## 2015-08-30 DIAGNOSIS — E1165 Type 2 diabetes mellitus with hyperglycemia: Secondary | ICD-10-CM | POA: Diagnosis not present

## 2015-08-30 DIAGNOSIS — H21512 Anterior synechiae (iris), left eye: Secondary | ICD-10-CM | POA: Diagnosis not present

## 2015-08-30 DIAGNOSIS — H26493 Other secondary cataract, bilateral: Secondary | ICD-10-CM | POA: Diagnosis not present

## 2015-08-30 DIAGNOSIS — H401132 Primary open-angle glaucoma, bilateral, moderate stage: Secondary | ICD-10-CM | POA: Diagnosis not present

## 2015-09-09 ENCOUNTER — Ambulatory Visit: Payer: Medicare Other | Admitting: Urology

## 2015-10-02 ENCOUNTER — Emergency Department (HOSPITAL_COMMUNITY)
Admission: EM | Admit: 2015-10-02 | Discharge: 2015-10-02 | Disposition: A | Discharge status: Home / Self Care | Payer: Medicare Other | Attending: Emergency Medicine | Admitting: Emergency Medicine

## 2015-10-02 ENCOUNTER — Emergency Department (HOSPITAL_COMMUNITY): Payer: Medicare Other

## 2015-10-02 ENCOUNTER — Encounter (HOSPITAL_COMMUNITY): Payer: Self-pay | Admitting: Emergency Medicine

## 2015-10-02 DIAGNOSIS — Z87891 Personal history of nicotine dependence: Secondary | ICD-10-CM | POA: Insufficient documentation

## 2015-10-02 DIAGNOSIS — I10 Essential (primary) hypertension: Secondary | ICD-10-CM | POA: Insufficient documentation

## 2015-10-02 DIAGNOSIS — E119 Type 2 diabetes mellitus without complications: Secondary | ICD-10-CM | POA: Insufficient documentation

## 2015-10-02 DIAGNOSIS — Z79899 Other long term (current) drug therapy: Secondary | ICD-10-CM | POA: Insufficient documentation

## 2015-10-02 DIAGNOSIS — Z8673 Personal history of transient ischemic attack (TIA), and cerebral infarction without residual deficits: Secondary | ICD-10-CM | POA: Diagnosis not present

## 2015-10-02 DIAGNOSIS — R109 Unspecified abdominal pain: Secondary | ICD-10-CM | POA: Diagnosis not present

## 2015-10-02 DIAGNOSIS — N200 Calculus of kidney: Secondary | ICD-10-CM | POA: Insufficient documentation

## 2015-10-02 DIAGNOSIS — N12 Tubulo-interstitial nephritis, not specified as acute or chronic: Secondary | ICD-10-CM | POA: Diagnosis not present

## 2015-10-02 DIAGNOSIS — Z7984 Long term (current) use of oral hypoglycemic drugs: Secondary | ICD-10-CM | POA: Insufficient documentation

## 2015-10-02 LAB — COMPREHENSIVE METABOLIC PANEL
ALT: 15 U/L (ref 14–54)
ANION GAP: 12 (ref 5–15)
AST: 24 U/L (ref 15–41)
Albumin: 4.4 g/dL (ref 3.5–5.0)
Alkaline Phosphatase: 30 U/L — ABNORMAL LOW (ref 38–126)
BUN: 22 mg/dL — ABNORMAL HIGH (ref 6–20)
CO2: 22 mmol/L (ref 22–32)
Calcium: 8.7 mg/dL — ABNORMAL LOW (ref 8.9–10.3)
Chloride: 104 mmol/L (ref 101–111)
Creatinine, Ser: 1.11 mg/dL — ABNORMAL HIGH (ref 0.44–1.00)
GFR calc Af Amer: 57 mL/min — ABNORMAL LOW (ref >60)
GFR calc non Af Amer: 49 mL/min — ABNORMAL LOW (ref >60)
Glucose, Bld: 129 mg/dL — ABNORMAL HIGH (ref 65–99)
POTASSIUM: 4 mmol/L (ref 3.5–5.1)
SODIUM: 138 mmol/L (ref 135–145)
Total Bilirubin: 0.6 mg/dL (ref 0.3–1.2)
Total Protein: 7.4 g/dL (ref 6.5–8.1)

## 2015-10-02 LAB — CBC WITH DIFFERENTIAL/PLATELET
BASOS ABS: 0 10*3/uL (ref 0.0–0.1)
BASOS PCT: 0 %
EOS PCT: 1 %
Eosinophils Absolute: 0.1 10*3/uL (ref 0.0–0.7)
HCT: 35.1 % — ABNORMAL LOW (ref 36.0–46.0)
Hemoglobin: 11.8 g/dL — ABNORMAL LOW (ref 12.0–15.0)
Lymphocytes Relative: 15 %
Lymphs Abs: 1.2 10*3/uL (ref 0.7–4.0)
MCH: 31.9 pg (ref 26.0–34.0)
MCHC: 33.6 g/dL (ref 30.0–36.0)
MCV: 94.9 fL (ref 78.0–100.0)
MONO ABS: 0.4 10*3/uL (ref 0.1–1.0)
Monocytes Relative: 5 %
Neutro Abs: 6.1 10*3/uL (ref 1.7–7.7)
Neutrophils Relative %: 79 %
Platelets: 270 10*3/uL (ref 150–400)
RBC: 3.7 MIL/uL — AB (ref 3.87–5.11)
RDW: 14.2 % (ref 11.5–15.5)
WBC: 7.8 10*3/uL (ref 4.0–10.5)

## 2015-10-02 LAB — URINALYSIS, ROUTINE W REFLEX MICROSCOPIC
Bilirubin Urine: NEGATIVE
GLUCOSE, UA: NEGATIVE mg/dL
Ketones, ur: NEGATIVE mg/dL
NITRITE: NEGATIVE
PH: 5.5 (ref 5.0–8.0)
Protein, ur: NEGATIVE mg/dL
Specific Gravity, Urine: 1.01 (ref 1.005–1.030)

## 2015-10-02 LAB — URINE MICROSCOPIC-ADD ON

## 2015-10-02 MED ORDER — DIPHENHYDRAMINE HCL 50 MG/ML IJ SOLN
25.0000 mg | Freq: Once | INTRAMUSCULAR | Status: AC
  Administered 2015-10-02: 25 mg via INTRAVENOUS
  Filled 2015-10-02: qty 1

## 2015-10-02 MED ORDER — HYDROCODONE-ACETAMINOPHEN 5-325 MG PO TABS
2.0000 | ORAL_TABLET | Freq: Once | ORAL | Status: DC
  Filled 2015-10-02: qty 2

## 2015-10-02 MED ORDER — MORPHINE SULFATE (PF) 4 MG/ML IV SOLN
4.0000 mg | Freq: Once | INTRAVENOUS | Status: AC
  Administered 2015-10-02: 4 mg via INTRAVENOUS
  Filled 2015-10-02: qty 1

## 2015-10-02 MED ORDER — HYDROMORPHONE HCL 1 MG/ML IJ SOLN
1.0000 mg | Freq: Once | INTRAMUSCULAR | Status: AC
  Administered 2015-10-02: 1 mg via INTRAMUSCULAR
  Filled 2015-10-02: qty 1

## 2015-10-02 MED ORDER — ONDANSETRON HCL 4 MG/2ML IJ SOLN
4.0000 mg | Freq: Once | INTRAMUSCULAR | Status: AC
  Administered 2015-10-02: 4 mg via INTRAVENOUS
  Filled 2015-10-02: qty 2

## 2015-10-02 MED ORDER — CIPROFLOXACIN HCL 500 MG PO TABS: 500.0000 mg | ORAL_TABLET | Freq: Two times a day (BID) | ORAL | Status: DC

## 2015-10-02 MED ORDER — CIPROFLOXACIN HCL 250 MG PO TABS
500.0000 mg | ORAL_TABLET | Freq: Once | ORAL | Status: AC
  Administered 2015-10-02: 500 mg via ORAL
  Filled 2015-10-02: qty 2

## 2015-10-02 MED ORDER — SODIUM CHLORIDE 0.9 % IV BOLUS (SEPSIS)
1000.0000 mL | Freq: Once | INTRAVENOUS | Status: AC
  Administered 2015-10-02: 1000 mL via INTRAVENOUS

## 2015-10-02 NOTE — ED Notes (Signed)
Patient states that she takes hydrocodone on daily basis and that he has not helped the pain. Per patient can just take hers instead of taking EDP prescribed medication. EDP made aware. EDP to room to talk to patient.

## 2015-10-02 NOTE — ED Notes (Signed)
Pt states she began having right sided flank pain three days ago. States hx of kidney stones, denies fever, V/D/, dysuria, or polyuria. States pain across right flank area with N/.

## 2015-10-02 NOTE — Discharge Instructions (Signed)
It was our pleasure to provide your ER care today - we hope that you feel better.  The lab work shows a probable urine infection - take antibiotic as prescribed. A urine culture was sent the results of which will be back in 2-3 days - have your doctor follow up on that result then.  Take your pain medication as need.  Follow up with your doctor this week if symptoms fail to improve/resolve.  Return to ER if worse, new symptoms, persistent vomiting, other concern.  You were given pain medication in the ER - no driving for the next 4 hours.    Flank Pain Flank pain refers to pain that is located on the side of the body between the upper abdomen and the back. The pain may occur over a short period of time (acute) or may be long-term or reoccurring (chronic). It may be mild or severe. Flank pain can be caused by many things. CAUSES  Some of the more common causes of flank pain include:  Muscle strains.   Muscle spasms.   A disease of your spine (vertebral disk disease).   A lung infection (pneumonia).   Fluid around your lungs (pulmonary edema).   A kidney infection.   Kidney stones.   A very painful skin rash caused by the chickenpox virus (shingles).   Gallbladder disease.  [place] care will depend on the cause of your pain. In general,  Rest as directed by your caregiver.  Drink enough fluids to keep your urine clear or pale yellow.  Only take over-the-counter or prescription medicines as directed by your caregiver. Some medicines may help relieve the pain.  Tell your caregiver about any changes in your pain.  Follow up with your caregiver as directed. SEEK IMMEDIATE MEDICAL CARE IF:   Your pain is not controlled with medicine.   You have new or worsening symptoms.  Your pain increases.   You have abdominal pain.   You have shortness of breath.   You have persistent nausea or vomiting.   You have swelling in your abdomen.    You feel faint or pass out.   You have blood in your urine.  You have a fever or persistent symptoms for more than 2-3 days.  You have a fever and your symptoms suddenly get worse. MAKE SURE YOU:   Understand these instructions.  Will watch your condition.  Will get help right away if you are not doing well or get worse.   This information is not intended to replace advice given to you by your health care provider. Make sure you discuss any questions you have with your health care provider.   Document Released: 07/19/2005 Document Revised: 02/20/2012 Document Reviewed: 01/10/2012 Elsevier Interactive Patient Education 2016 Elsevier Inc.    Pyelonephritis, Adult Pyelonephritis is a kidney infection. The kidneys are the organs that filter a person's blood and move waste out of the bloodstream and into the urine. Urine passes from the kidneys, through the ureters, and into the bladder. There are two main types of pyelonephritis:  Infections that come on quickly without any warning (acute pyelonephritis).  Infections that last for a long period of time (chronic pyelonephritis). In most cases, the infection clears up with treatment and does not cause further problems. More severe infections or chronic infections can sometimes spread to the bloodstream or lead to other problems with the kidneys. CAUSES This condition is usually caused by:  Bacteria traveling from the bladder to  the kidney through infected urine. The urine in the bladder can become infected with bacteria from:  Bladder infection (cystitis).  Inflammation of the prostate gland (prostatitis).  Sexual intercourse, in females.  Bacteria traveling from the bloodstream to the kidney. RISK FACTORS This condition is more likely to develop in:  Pregnant women.  Older people.  People who have diabetes.  People who have kidney stones or bladder stones.  People who have other abnormalities of the kidney or  ureter.  People who have a catheter placed in the bladder.  People who have cancer.  People who are sexually active.  Women who use spermicides.  People who have had a prior urinary tract infection. SYMPTOMS Symptoms of this condition include:  Frequent urination.  Strong or persistent urge to urinate.  Burning or stinging when urinating.  Abdominal pain.  Back pain.  Pain in the side or flank area.  Fever.  Chills.  Blood in the urine, or dark urine.  Nausea.  Vomiting. DIAGNOSIS This condition may be diagnosed based on:  Medical history and physical exam.  Urine tests.  Blood tests. You may also have imaging tests of the kidneys, such as an ultrasound or CT scan. TREATMENT Treatment for this condition may depend on the severity of the infection.  If the infection is mild and is found early, you may be treated with antibiotic medicines taken by mouth. You will need to drink fluids to remain hydrated.  If the infection is more severe, you may need to stay in the hospital and receive antibiotics given directly into a vein through an IV tube. You may also need to receive fluids through an IV tube if you are not able to remain hydrated. After your hospital stay, you may need to take oral antibiotics for a period of time. Other treatments may be required, depending on the cause of the infection. HOME CARE INSTRUCTIONS Medicines  Take over-the-counter and prescription medicines only as told by your health care provider.  If you were prescribed an antibiotic medicine, take it as told by your health care provider. Do not stop taking the antibiotic even if you start to feel better. General Instructions  Drink enough fluid to keep your urine clear or pale yellow.  Avoid caffeine, tea, and carbonated beverages. They tend to irritate the bladder.  Urinate often. Avoid holding in urine for long periods of time.  Urinate before and after sex.  After a bowel  movement, women should cleanse from front to back. Use each tissue only once.  Keep all follow-up visits as told by your health care provider. This is important. SEEK MEDICAL CARE IF:  Your symptoms do not get better after 2 days of treatment.  Your symptoms get worse.  You have a fever. SEEK IMMEDIATE MEDICAL CARE IF:  You are unable to take your antibiotics or fluids.  You have shaking chills.  You vomit.  You have severe flank or back pain.  You have extreme weakness or fainting.   This information is not intended to replace advice given to you by your health care provider. Make sure you discuss any questions you have with your health care provider.   Document Released: 05/28/2005 Document Revised: 02/16/2015 Document Reviewed: 09/20/2014 Elsevier Interactive Patient Education [employer].

## 2015-10-02 NOTE — ED Notes (Signed)
Patient reports improvement in pain and itching.

## 2015-10-02 NOTE — ED Provider Notes (Signed)
Signed out at [place] by Dr Dina Rich that patient was awaiting ct r/o ureteral stone.  CT neg for acute process. ua pending.  Will cath for ua.   UA with tntc wbc and many bact, right cva tenderness on exam.  Will culture and rx.  Confirmed w pt, only abx allergies are pcn/ceph.  Cipro.   Pt requests pain shot (pt had inadvertently pulled iv out).  Dilaudid 1 mg im.   Po fluids.  abd soft nt. Tolerating po.   Filed Vitals:   10/02/15 0641 10/02/15 0905  BP: 130/89 149/88  Pulse: 92 92  Temp: 98.1 F (36.7 C) 97.7 F (36.5 C)  Resp: 20 15   Pt currently appears stable for d/c.   Return precautions provided.     Lajean Saver, MD 10/02/15 1012

## 2015-10-02 NOTE — ED Notes (Signed)
Patient c/o itching after morphine administered. EDP made aware-verbal order given.

## 2015-10-02 NOTE — ED Provider Notes (Signed)
CSN: [id number]     Arrival date & time 10/02/15  [id number] History   First MD Initiated Contact with Patient 10/02/15 [phone]     Chief Complaint  Patient presents with  . Nephrolithiasis     (Consider location/radiation/quality/duration/timing/severity/associated sxs/prior Treatment) HPI  This is a 70 year old female with history of hypertension, diabetes, coronary artery disease who presents with right flank pain. Patient reports 3 day history of colicky right flank pain. She states that radiates from her back forward into her right abdomen. It comes and goes. Currently her pain is 7 out of 10. It is sharp in nature. Patient does report a history of kidney stones proximally 25 years ago. Patient denies any hematuria or dysuria. She denies any fevers.  She reports nausea without vomiting. No diarrhea. Denies abdominal pain.  Of note, patient had a CT scan in February which showed a 2 mm stone in the right renal pelvis.  Past Medical History  Diagnosis Date  . Hypertension   . Dysrhythmia   . Sleep apnea     sleep study  oct 2012  . Anginal pain [facility])     sees Dr Einar Gip.   . Diabetes mellitus without complication (HCC)     Metformin 2.3.2017  . Stroke [facility]) 2011    [facility]    . Peripheral vascular disease (HCC)     legs  . GERD (gastroesophageal reflux disease)   . Arthritis     rheumatoid ..   . Fibromyalgia   . Liver disease 08- 2012    per Dr Dagmar Hait pt has enlarged liver  . Thyroid disease   . Diverticulitis   . High cholesterol    Past Surgical History  Procedure Laterality Date  . Coronary artery bypass graft  1986  . Cardiac catheterization  1996  1986  . Eye surgery  2013    cat ext bilateral .  . Eye surgery  2002    laser  . Fracture surgery  -1988- 2007    rod right leg - right wrist plate  . Cholecystectomy  1996  . Pars plana vitrectomy  03/25/2012    Procedure: PARS PLANA VITRECTOMY WITH 25 GAUGE;  Surgeon: Hayden Pedro, MD;  Location: [place];   Service: Ophthalmology;  Laterality: Right;  . Gas/fluid exchange  03/25/2012    Procedure: GAS/FLUID EXCHANGE;  Surgeon: Hayden Pedro, MD;  Location: [place];  Service: Ophthalmology;  Laterality: Right;   Family History  Problem Relation Age of Onset  . Hypertension Daughter   . Rheum arthritis Maternal Grandmother    Social History  Substance Use Topics  . Smoking status: Former Smoker -- 0.25 packs/day for 5 years    Types: Cigarettes    Quit date: 06/11/1984  . Smokeless tobacco: Never Used  . Alcohol Use: 0.0 oz/week     Comment: occ   OB History    No data available     Review of Systems  Constitutional: Negative for fever.  Respiratory: Negative for cough, chest tightness and shortness of breath.   Cardiovascular: Negative for chest pain.  Gastrointestinal: Positive for nausea. Negative for vomiting and abdominal pain.  Genitourinary: Positive for flank pain. Negative for dysuria.  Skin: Negative for rash.  Neurological: Negative for headaches.  All other systems reviewed and are negative.     Allergies  Fentanyl; Cefdinir; Lactose intolerance (gi); Butrans; Penicillins; and Simvastatin  Home Medications   Prior to Admission medications   Medication Sig Start Date End Date  Taking? Authorizing Provider  Blood Glucose Monitoring Suppl (FREESTYLE LITE) DEVI See admin instructions. 12/22/14   Historical Provider, MD  clopidogrel (PLAVIX) 75 MG tablet Take 1 tablet (75 mg total) by mouth daily with breakfast. 12/31/14   Garvin Fila, MD  diclofenac sodium (VOLTAREN) 1 % GEL Apply 2 g topically daily as needed (Pain).    Historical Provider, MD  escitalopram (LEXAPRO) 10 MG tablet Take 10 mg by mouth daily.    Historical Provider, MD  esomeprazole (NEXIUM) 40 MG capsule Take 40 mg by mouth daily.     Historical Provider, MD  estrogen, conjugated,-medroxyprogesterone (PREMPRO) 0.625-5 MG per tablet Take 1 tablet by mouth daily.    Historical Provider, MD   levothyroxine (SYNTHROID, LEVOTHROID) 50 MCG tablet Take 50 mcg by mouth daily before breakfast.    Historical Provider, MD  Olmesartan-Amlodipine-HCTZ (TRIBENZOR) 40-5-25 MG TABS Take 1 tablet by mouth daily.    Historical Provider, MD  oxyCODONE-acetaminophen (PERCOCET) 7.5-325 MG per tablet Take 1 tablet by mouth every 6 (six) hours as needed for pain.    Historical Provider, MD  pravastatin (PRAVACHOL) 40 MG tablet Take 40 mg by mouth at bedtime.    Historical Provider, MD  spironolactone (ALDACTONE) 25 MG tablet Take 25 mg by mouth daily.     Historical Provider, MD  Tafluprost (ZIOPTAN) 0.0015 % SOLN Apply 1 drop to eye daily. Both eyes.    Historical Provider, MD  temazepam (RESTORIL) 30 MG capsule Take 30 mg by mouth at bedtime.    Historical Provider, MD  Tofacitinib Citrate (XELJANZ) 5 MG TABS Take 5 mg by mouth 2 (two) times daily.    Historical Provider, MD   BP 130/89 mmHg  Pulse 92  Temp(Src) 98.1 F (36.7 C) (Oral)  Resp 20  Ht 5\' 6"  (1.676 m)  Wt 270 lb (122.471 kg)  BMI 43.60 kg/m2  SpO2 97% Physical Exam  Constitutional: She is oriented to person, place, and time. She appears well-developed and well-nourished. No distress.  Obese, uncomfortable appearing  HENT:  Head: Normocephalic and atraumatic.  Cardiovascular: Normal rate, regular rhythm and normal heart sounds.   No murmur heard. Pulmonary/Chest: Effort normal and breath sounds normal. No respiratory distress. She has no wheezes.  Midline sternotomy scar  Abdominal: Soft. Bowel sounds are normal. There is no tenderness. There is no rebound and no guarding.  No rash  Neurological: She is alert and oriented to person, place, and time.  Skin: Skin is warm and dry. No rash noted.  Psychiatric: She has a normal mood and affect.  Nursing note and vitals reviewed.   ED Course  Procedures (including critical care time) Labs Review Labs Reviewed  CBC WITH DIFFERENTIAL/PLATELET  COMPREHENSIVE METABOLIC PANEL   URINALYSIS, ROUTINE W REFLEX MICROSCOPIC (NOT AT [facility])    Imaging Review No results found. I have personally reviewed and evaluated these images and lab results as part of my medical decision-making.   EKG Interpretation None      MDM   Final diagnoses:  Flank pain    Patient presents with right flank pain. History and physical exam suggestive of kidney stones. She is status post cholecystectomy. She is relatively nontender on exam. She is uncomfortable appearing.  Vital signs are reassuring. Basic lab work and urinalysis obtained. Patient was given pain and nausea medication as well as fluids. Unfortunately I do not have access to ultrasound at this time. I feel this is most likely a stone and we'll treat her as such. Given  the patient's age, I do feel like she should have a study to evaluate her kidneys to ensure that there is no significant hydronephrosis. Given this, will obtain a renal stone study. This will also evaluate for possible other etiologies pain.  Signed out pending lab work and imaging.    Merryl Hacker, MD 10/02/15 [phone]

## 2015-10-03 DIAGNOSIS — N10 Acute tubulo-interstitial nephritis: Secondary | ICD-10-CM | POA: Diagnosis not present

## 2015-10-05 LAB — URINE CULTURE

## 2015-10-06 ENCOUNTER — Telehealth: Payer: Self-pay | Admitting: *Deleted

## 2015-10-06 NOTE — ED Notes (Signed)
Post ED Visit - Positive Culture Follow-up  Culture report reviewed by antimicrobial stewardship pharmacist:  []  Elenor Quinones, Pharm.D. [x]  Heide Guile, Pharm.D., BCPS []  Parks Neptune, Pharm.D. []  Alycia Rossetti, Pharm.D., BCPS []  [place], Pharm.D., BCPS, AAHIVP []  Legrand Como, Pharm.D., BCPS, AAHIVP []  Milus Glazier, Pharm.D. []  Stephens November, Pharm.D.  Positive urine culture Treated with Ciprofloxacin, organism sensitive to the same and no further patient follow-up is required at this time.  Harlon Flor [facility] 10/06/2015, 12:09 PM

## 2015-10-17 DIAGNOSIS — B029 Zoster without complications: Secondary | ICD-10-CM | POA: Diagnosis not present

## 2015-11-18 DIAGNOSIS — M797 Fibromyalgia: Secondary | ICD-10-CM | POA: Diagnosis not present

## 2015-11-18 DIAGNOSIS — M150 Primary generalized (osteo)arthritis: Secondary | ICD-10-CM | POA: Diagnosis not present

## 2015-11-18 DIAGNOSIS — N183 Chronic kidney disease, stage 3 (moderate): Secondary | ICD-10-CM | POA: Diagnosis not present

## 2015-11-18 DIAGNOSIS — M0589 Other rheumatoid arthritis with rheumatoid factor of multiple sites: Secondary | ICD-10-CM | POA: Diagnosis not present

## 2016-01-03 ENCOUNTER — Other Ambulatory Visit: Payer: Self-pay | Admitting: Neurology

## 2016-01-11 DIAGNOSIS — M545 Low back pain: Secondary | ICD-10-CM | POA: Diagnosis not present

## 2016-01-25 DIAGNOSIS — M5412 Radiculopathy, cervical region: Secondary | ICD-10-CM | POA: Diagnosis not present

## 2016-01-25 DIAGNOSIS — Z6841 Body Mass Index (BMI) 40.0 and over, adult: Secondary | ICD-10-CM | POA: Diagnosis not present

## 2016-01-26 DIAGNOSIS — E559 Vitamin D deficiency, unspecified: Secondary | ICD-10-CM | POA: Diagnosis not present

## 2016-01-26 DIAGNOSIS — E119 Type 2 diabetes mellitus without complications: Secondary | ICD-10-CM | POA: Diagnosis not present

## 2016-01-30 DIAGNOSIS — I10 Essential (primary) hypertension: Secondary | ICD-10-CM | POA: Diagnosis not present

## 2016-01-30 DIAGNOSIS — Z0000 Encounter for general adult medical examination without abnormal findings: Secondary | ICD-10-CM | POA: Diagnosis not present

## 2016-01-30 DIAGNOSIS — E782 Mixed hyperlipidemia: Secondary | ICD-10-CM | POA: Diagnosis not present

## 2016-01-30 DIAGNOSIS — E119 Type 2 diabetes mellitus without complications: Secondary | ICD-10-CM | POA: Diagnosis not present

## 2016-01-30 DIAGNOSIS — M797 Fibromyalgia: Secondary | ICD-10-CM | POA: Diagnosis not present

## 2016-01-30 DIAGNOSIS — K219 Gastro-esophageal reflux disease without esophagitis: Secondary | ICD-10-CM | POA: Diagnosis not present

## 2016-01-30 DIAGNOSIS — E559 Vitamin D deficiency, unspecified: Secondary | ICD-10-CM | POA: Diagnosis not present

## 2016-02-17 DIAGNOSIS — M797 Fibromyalgia: Secondary | ICD-10-CM | POA: Diagnosis not present

## 2016-02-17 DIAGNOSIS — N183 Chronic kidney disease, stage 3 (moderate): Secondary | ICD-10-CM | POA: Diagnosis not present

## 2016-02-17 DIAGNOSIS — M67441 Ganglion, right hand: Secondary | ICD-10-CM | POA: Diagnosis not present

## 2016-02-17 DIAGNOSIS — M25561 Pain in right knee: Secondary | ICD-10-CM | POA: Diagnosis not present

## 2016-02-17 DIAGNOSIS — M0589 Other rheumatoid arthritis with rheumatoid factor of multiple sites: Secondary | ICD-10-CM | POA: Diagnosis not present

## 2016-02-17 DIAGNOSIS — M150 Primary generalized (osteo)arthritis: Secondary | ICD-10-CM | POA: Diagnosis not present

## 2016-03-09 DIAGNOSIS — H401132 Primary open-angle glaucoma, bilateral, moderate stage: Secondary | ICD-10-CM | POA: Diagnosis not present

## 2016-03-09 DIAGNOSIS — E1165 Type 2 diabetes mellitus with hyperglycemia: Secondary | ICD-10-CM | POA: Diagnosis not present

## 2016-03-09 DIAGNOSIS — H04123 Dry eye syndrome of bilateral lacrimal glands: Secondary | ICD-10-CM | POA: Diagnosis not present

## 2016-03-09 DIAGNOSIS — H348112 Central retinal vein occlusion, right eye, stable: Secondary | ICD-10-CM | POA: Diagnosis not present

## 2016-03-19 DIAGNOSIS — M5412 Radiculopathy, cervical region: Secondary | ICD-10-CM | POA: Diagnosis not present

## 2016-03-19 DIAGNOSIS — Z6841 Body Mass Index (BMI) 40.0 and over, adult: Secondary | ICD-10-CM | POA: Diagnosis not present

## 2016-04-09 DIAGNOSIS — M5412 Radiculopathy, cervical region: Secondary | ICD-10-CM | POA: Diagnosis not present

## 2016-04-09 DIAGNOSIS — Z6841 Body Mass Index (BMI) 40.0 and over, adult: Secondary | ICD-10-CM | POA: Diagnosis not present

## 2016-05-09 DIAGNOSIS — Z23 Encounter for immunization: Secondary | ICD-10-CM | POA: Diagnosis not present

## 2016-05-18 DIAGNOSIS — N183 Chronic kidney disease, stage 3 (moderate): Secondary | ICD-10-CM | POA: Diagnosis not present

## 2016-05-18 DIAGNOSIS — M7989 Other specified soft tissue disorders: Secondary | ICD-10-CM | POA: Diagnosis not present

## 2016-05-18 DIAGNOSIS — M150 Primary generalized (osteo)arthritis: Secondary | ICD-10-CM | POA: Diagnosis not present

## 2016-05-18 DIAGNOSIS — M797 Fibromyalgia: Secondary | ICD-10-CM | POA: Diagnosis not present

## 2016-05-18 DIAGNOSIS — M67441 Ganglion, right hand: Secondary | ICD-10-CM | POA: Diagnosis not present

## 2016-05-18 DIAGNOSIS — M0589 Other rheumatoid arthritis with rheumatoid factor of multiple sites: Secondary | ICD-10-CM | POA: Diagnosis not present

## 2016-05-18 DIAGNOSIS — M25561 Pain in right knee: Secondary | ICD-10-CM | POA: Diagnosis not present

## 2016-05-18 DIAGNOSIS — Z6841 Body Mass Index (BMI) 40.0 and over, adult: Secondary | ICD-10-CM | POA: Diagnosis not present

## 2016-06-17 DIAGNOSIS — J189 Pneumonia, unspecified organism: Secondary | ICD-10-CM

## 2016-06-17 HISTORY — DX: Pneumonia, unspecified organism: J18.9

## 2016-06-18 ENCOUNTER — Other Ambulatory Visit (HOSPITAL_COMMUNITY): Payer: Self-pay | Admitting: Adult Health Nurse Practitioner

## 2016-06-18 ENCOUNTER — Ambulatory Visit (HOSPITAL_COMMUNITY)
Admission: RE | Admit: 2016-06-18 | Discharge: 2016-06-18 | Disposition: A | Discharge status: Home / Self Care | Payer: Medicare Other | Source: Ambulatory Visit | Attending: Adult Health Nurse Practitioner | Admitting: Adult Health Nurse Practitioner

## 2016-06-18 DIAGNOSIS — R509 Fever, unspecified: Secondary | ICD-10-CM

## 2016-06-18 DIAGNOSIS — R05 Cough: Secondary | ICD-10-CM | POA: Insufficient documentation

## 2016-06-18 DIAGNOSIS — R918 Other nonspecific abnormal finding of lung field: Secondary | ICD-10-CM | POA: Diagnosis not present

## 2016-06-18 DIAGNOSIS — R059 Cough, unspecified: Secondary | ICD-10-CM

## 2016-06-18 DIAGNOSIS — J180 Bronchopneumonia, unspecified organism: Secondary | ICD-10-CM | POA: Diagnosis not present

## 2016-06-18 DIAGNOSIS — J060 Acute laryngopharyngitis: Secondary | ICD-10-CM | POA: Diagnosis not present

## 2016-06-27 ENCOUNTER — Inpatient Hospital Stay (HOSPITAL_COMMUNITY)
Admission: EM | Admit: 2016-06-27 | Discharge: 2016-06-29 | DRG: 683 | Disposition: A | Discharge status: Home / Self Care | Payer: Medicare Other | Attending: Internal Medicine | Admitting: Internal Medicine

## 2016-06-27 ENCOUNTER — Emergency Department (HOSPITAL_COMMUNITY): Payer: Medicare Other

## 2016-06-27 ENCOUNTER — Inpatient Hospital Stay (HOSPITAL_COMMUNITY): Payer: Medicare Other

## 2016-06-27 ENCOUNTER — Encounter (HOSPITAL_COMMUNITY): Payer: Self-pay | Admitting: Emergency Medicine

## 2016-06-27 DIAGNOSIS — Z880 Allergy status to penicillin: Secondary | ICD-10-CM | POA: Diagnosis not present

## 2016-06-27 DIAGNOSIS — E119 Type 2 diabetes mellitus without complications: Secondary | ICD-10-CM

## 2016-06-27 DIAGNOSIS — N179 Acute kidney failure, unspecified: Principal | ICD-10-CM | POA: Diagnosis present

## 2016-06-27 DIAGNOSIS — Z7984 Long term (current) use of oral hypoglycemic drugs: Secondary | ICD-10-CM

## 2016-06-27 DIAGNOSIS — K219 Gastro-esophageal reflux disease without esophagitis: Secondary | ICD-10-CM | POA: Diagnosis present

## 2016-06-27 DIAGNOSIS — Y929 Unspecified place or not applicable: Secondary | ICD-10-CM

## 2016-06-27 DIAGNOSIS — R05 Cough: Secondary | ICD-10-CM | POA: Diagnosis not present

## 2016-06-27 DIAGNOSIS — I10 Essential (primary) hypertension: Secondary | ICD-10-CM | POA: Diagnosis present

## 2016-06-27 DIAGNOSIS — S199XXA Unspecified injury of neck, initial encounter: Secondary | ICD-10-CM | POA: Diagnosis not present

## 2016-06-27 DIAGNOSIS — R061 Stridor: Secondary | ICD-10-CM | POA: Diagnosis not present

## 2016-06-27 DIAGNOSIS — Z79891 Long term (current) use of opiate analgesic: Secondary | ICD-10-CM

## 2016-06-27 DIAGNOSIS — Z8673 Personal history of transient ischemic attack (TIA), and cerebral infarction without residual deficits: Secondary | ICD-10-CM | POA: Diagnosis not present

## 2016-06-27 DIAGNOSIS — Z951 Presence of aortocoronary bypass graft: Secondary | ICD-10-CM

## 2016-06-27 DIAGNOSIS — N141 Nephropathy induced by other drugs, medicaments and biological substances: Secondary | ICD-10-CM | POA: Diagnosis present

## 2016-06-27 DIAGNOSIS — D649 Anemia, unspecified: Secondary | ICD-10-CM | POA: Diagnosis present

## 2016-06-27 DIAGNOSIS — R42 Dizziness and giddiness: Secondary | ICD-10-CM | POA: Diagnosis not present

## 2016-06-27 DIAGNOSIS — W010XXA Fall on same level from slipping, tripping and stumbling without subsequent striking against object, initial encounter: Secondary | ICD-10-CM | POA: Diagnosis present

## 2016-06-27 DIAGNOSIS — E669 Obesity, unspecified: Secondary | ICD-10-CM | POA: Diagnosis present

## 2016-06-27 DIAGNOSIS — Z87891 Personal history of nicotine dependence: Secondary | ICD-10-CM

## 2016-06-27 DIAGNOSIS — Z6841 Body Mass Index (BMI) 40.0 and over, adult: Secondary | ICD-10-CM

## 2016-06-27 DIAGNOSIS — Z8249 Family history of ischemic heart disease and other diseases of the circulatory system: Secondary | ICD-10-CM

## 2016-06-27 DIAGNOSIS — E079 Disorder of thyroid, unspecified: Secondary | ICD-10-CM | POA: Diagnosis present

## 2016-06-27 DIAGNOSIS — I959 Hypotension, unspecified: Secondary | ICD-10-CM | POA: Diagnosis not present

## 2016-06-27 DIAGNOSIS — E875 Hyperkalemia: Secondary | ICD-10-CM | POA: Diagnosis present

## 2016-06-27 DIAGNOSIS — M069 Rheumatoid arthritis, unspecified: Secondary | ICD-10-CM | POA: Diagnosis not present

## 2016-06-27 DIAGNOSIS — G4730 Sleep apnea, unspecified: Secondary | ICD-10-CM | POA: Diagnosis present

## 2016-06-27 DIAGNOSIS — F419 Anxiety disorder, unspecified: Secondary | ICD-10-CM | POA: Diagnosis present

## 2016-06-27 DIAGNOSIS — T445X5A Adverse effect of predominantly beta-adrenoreceptor agonists, initial encounter: Secondary | ICD-10-CM | POA: Diagnosis present

## 2016-06-27 DIAGNOSIS — M542 Cervicalgia: Secondary | ICD-10-CM | POA: Diagnosis present

## 2016-06-27 DIAGNOSIS — R404 Transient alteration of awareness: Secondary | ICD-10-CM | POA: Diagnosis not present

## 2016-06-27 DIAGNOSIS — E860 Dehydration: Secondary | ICD-10-CM | POA: Diagnosis present

## 2016-06-27 DIAGNOSIS — Z888 Allergy status to other drugs, medicaments and biological substances status: Secondary | ICD-10-CM

## 2016-06-27 DIAGNOSIS — W19XXXA Unspecified fall, initial encounter: Secondary | ICD-10-CM

## 2016-06-27 DIAGNOSIS — E739 Lactose intolerance, unspecified: Secondary | ICD-10-CM | POA: Diagnosis present

## 2016-06-27 DIAGNOSIS — Z79899 Other long term (current) drug therapy: Secondary | ICD-10-CM

## 2016-06-27 DIAGNOSIS — Z7902 Long term (current) use of antithrombotics/antiplatelets: Secondary | ICD-10-CM

## 2016-06-27 HISTORY — DX: Pneumonia, unspecified organism: J18.9

## 2016-06-27 LAB — URINALYSIS, ROUTINE W REFLEX MICROSCOPIC
Bilirubin Urine: NEGATIVE
GLUCOSE, UA: NEGATIVE mg/dL
HGB URINE DIPSTICK: NEGATIVE
Ketones, ur: NEGATIVE mg/dL
LEUKOCYTES UA: NEGATIVE
Nitrite: NEGATIVE
PH: 5.5 (ref 5.0–8.0)
Protein, ur: NEGATIVE mg/dL
SPECIFIC GRAVITY, URINE: 1.02 (ref 1.005–1.030)

## 2016-06-27 LAB — CBC WITH DIFFERENTIAL/PLATELET
Basophils Absolute: 0 10*3/uL (ref 0.0–0.1)
Basophils Relative: 0 %
EOS ABS: 0.1 10*3/uL (ref 0.0–0.7)
Eosinophils Relative: 1 %
HEMATOCRIT: 32.3 % — AB (ref 36.0–46.0)
Hemoglobin: 10.8 g/dL — ABNORMAL LOW (ref 12.0–15.0)
LYMPHS ABS: 0.6 10*3/uL — AB (ref 0.7–4.0)
Lymphocytes Relative: 6 %
MCH: 31.8 pg (ref 26.0–34.0)
MCHC: 33.4 g/dL (ref 30.0–36.0)
MCV: 95 fL (ref 78.0–100.0)
MONOS PCT: 9 %
Monocytes Absolute: 0.9 10*3/uL (ref 0.1–1.0)
NEUTROS PCT: 84 %
Neutro Abs: 8.5 10*3/uL — ABNORMAL HIGH (ref 1.7–7.7)
PLATELETS: 283 10*3/uL (ref 150–400)
RBC: 3.4 MIL/uL — AB (ref 3.87–5.11)
RDW: 13.7 % (ref 11.5–15.5)
WBC: 10.1 10*3/uL (ref 4.0–10.5)

## 2016-06-27 LAB — COMPREHENSIVE METABOLIC PANEL
ALBUMIN: 3.5 g/dL (ref 3.5–5.0)
ALT: 29 U/L (ref 14–54)
AST: 24 U/L (ref 15–41)
Alkaline Phosphatase: 38 U/L (ref 38–126)
Anion gap: 10 (ref 5–15)
BUN: 72 mg/dL — AB (ref 6–20)
CHLORIDE: 99 mmol/L — AB (ref 101–111)
CO2: 21 mmol/L — ABNORMAL LOW (ref 22–32)
CREATININE: 2.77 mg/dL — AB (ref 0.44–1.00)
Calcium: 8.9 mg/dL (ref 8.9–10.3)
GFR calc Af Amer: 19 mL/min — ABNORMAL LOW (ref >60)
GFR calc non Af Amer: 16 mL/min — ABNORMAL LOW (ref >60)
Glucose, Bld: 128 mg/dL — ABNORMAL HIGH (ref 65–99)
POTASSIUM: 5.4 mmol/L — AB (ref 3.5–5.1)
Sodium: 130 mmol/L — ABNORMAL LOW (ref 135–145)
Total Bilirubin: 0.8 mg/dL (ref 0.3–1.2)
Total Protein: 6.7 g/dL (ref 6.5–8.1)

## 2016-06-27 LAB — I-STAT TROPONIN, ED: Troponin i, poc: 0 ng/mL (ref 0.00–0.08)

## 2016-06-27 LAB — I-STAT CG4 LACTIC ACID, ED: Lactic Acid, Venous: 1.68 mmol/L (ref 0.5–1.9)

## 2016-06-27 LAB — TROPONIN I: Troponin I: <0.03 ng/mL (ref <0.03)

## 2016-06-27 MED ORDER — SODIUM CHLORIDE 0.9 % IV SOLN
INTRAVENOUS | Status: AC
  Administered 2016-06-27 – 2016-06-28 (×4): via INTRAVENOUS

## 2016-06-27 MED ORDER — PANTOPRAZOLE SODIUM 40 MG PO TBEC
40.0000 mg | DELAYED_RELEASE_TABLET | Freq: Every day | ORAL | Status: DC
  Administered 2016-06-28 – 2016-06-29 (×2): 40 mg via ORAL
  Filled 2016-06-27 (×2): qty 1

## 2016-06-27 MED ORDER — SODIUM CHLORIDE 0.9 % IV BOLUS (SEPSIS)
1000.0000 mL | Freq: Once | INTRAVENOUS | Status: AC
  Administered 2016-06-27: 1000 mL via INTRAVENOUS

## 2016-06-27 MED ORDER — PRAVASTATIN SODIUM 40 MG PO TABS
40.0000 mg | ORAL_TABLET | Freq: Every day | ORAL | Status: DC
  Administered 2016-06-27 – 2016-06-28 (×2): 40 mg via ORAL
  Filled 2016-06-27 (×3): qty 1

## 2016-06-27 MED ORDER — INSULIN ASPART 100 UNIT/ML ~~LOC~~ SOLN
0.0000 [IU] | Freq: Three times a day (TID) | SUBCUTANEOUS | Status: DC
  Administered 2016-06-28 (×3): 1 [IU] via SUBCUTANEOUS

## 2016-06-27 MED ORDER — ENSURE ENLIVE PO LIQD
237.0000 mL | Freq: Two times a day (BID) | ORAL | Status: DC
  Administered 2016-06-28 – 2016-06-29 (×2): 237 mL via ORAL

## 2016-06-27 MED ORDER — LATANOPROST 0.005 % OP SOLN
OPHTHALMIC | Status: AC
  Filled 2016-06-27: qty 2.5

## 2016-06-27 MED ORDER — CLOPIDOGREL BISULFATE 75 MG PO TABS
75.0000 mg | ORAL_TABLET | Freq: Every day | ORAL | Status: DC
  Administered 2016-06-28 – 2016-06-29 (×2): 75 mg via ORAL
  Filled 2016-06-27 (×2): qty 1

## 2016-06-27 MED ORDER — LATANOPROST 0.005 % OP SOLN
1.0000 [drp] | Freq: Every day | OPHTHALMIC | Status: DC
  Administered 2016-06-27 – 2016-06-28 (×2): 1 [drp] via OPHTHALMIC
  Filled 2016-06-27: qty 2.5

## 2016-06-27 MED ORDER — ONDANSETRON HCL 4 MG PO TABS: 4.0000 mg | ORAL_TABLET | Freq: Four times a day (QID) | ORAL | Status: DC | PRN

## 2016-06-27 MED ORDER — ESCITALOPRAM OXALATE 10 MG PO TABS
10.0000 mg | ORAL_TABLET | Freq: Every day | ORAL | Status: DC
  Administered 2016-06-28 – 2016-06-29 (×2): 10 mg via ORAL
  Filled 2016-06-27 (×2): qty 1

## 2016-06-27 MED ORDER — SENNOSIDES-DOCUSATE SODIUM 8.6-50 MG PO TABS: 1.0000 | ORAL_TABLET | Freq: Every evening | ORAL | Status: DC | PRN

## 2016-06-27 MED ORDER — ESTROGENS CONJUGATED 0.625 MG PO TABS
0.6250 mg | ORAL_TABLET | Freq: Every day | ORAL | Status: DC
  Administered 2016-06-28 – 2016-06-29 (×2): 0.625 mg via ORAL
  Filled 2016-06-27 (×2): qty 1

## 2016-06-27 MED ORDER — ONDANSETRON HCL 4 MG/2ML IJ SOLN
4.0000 mg | Freq: Four times a day (QID) | INTRAMUSCULAR | Status: DC | PRN
  Administered 2016-06-28: 4 mg via INTRAVENOUS
  Filled 2016-06-27: qty 2

## 2016-06-27 MED ORDER — TEMAZEPAM 15 MG PO CAPS
30.0000 mg | ORAL_CAPSULE | Freq: Every evening | ORAL | Status: DC | PRN
  Administered 2016-06-27 – 2016-06-28 (×2): 30 mg via ORAL
  Filled 2016-06-27 (×2): qty 2

## 2016-06-27 MED ORDER — CONJ ESTROG-MEDROXYPROGEST ACE 0.625-5 MG PO TABS: 1.0000 | ORAL_TABLET | Freq: Every day | ORAL | Status: DC

## 2016-06-27 MED ORDER — MEDROXYPROGESTERONE ACETATE 5 MG PO TABS
5.0000 mg | ORAL_TABLET | Freq: Every day | ORAL | Status: DC
  Administered 2016-06-28 – 2016-06-29 (×2): 5 mg via ORAL
  Filled 2016-06-27 (×5): qty 1

## 2016-06-27 MED ORDER — TOFACITINIB CITRATE 5 MG PO TABS: 5.0000 mg | ORAL_TABLET | Freq: Two times a day (BID) | ORAL | Status: DC

## 2016-06-27 MED ORDER — HEPARIN SODIUM (PORCINE) 5000 UNIT/ML IJ SOLN
5000.0000 [IU] | Freq: Three times a day (TID) | INTRAMUSCULAR | Status: DC
  Administered 2016-06-27 – 2016-06-29 (×5): 5000 [IU] via SUBCUTANEOUS
  Filled 2016-06-27 (×6): qty 1

## 2016-06-27 NOTE — ED Triage Notes (Signed)
Per EMS: Pt reports weakness, dizziness for a couple days.  Pt fell Sunday night and hit back of neck and head and right ribs, without LOC.  Pt not on blood thinners. Hypotensive en route. 100/70, 80/50, 96%, 90 hr, 151 cbg

## 2016-06-27 NOTE — ED Provider Notes (Signed)
[facility] DEPT Provider Note   CSN: [id number] Arrival date & time: 06/27/16  1446     History   Chief Complaint Chief Complaint  Patient presents with  . Hypotension    HPI [name] is a 71 y.o. female.  HPI Patient presents with dizziness and lightheadedness. She's had for last 2 weeks. States that she was diagnosed with pneumonia by her doctor. States she's been feeling bad since. States she has to have people holder so she can walk. States feels like she's been off pass out. Hypotensive for EMS. States she fell 4 days ago and has pain in her right chest and her neck. Had a loss of consciousness prior to the fall. No fevers or chills. States she did have fever 2 weeks ago with pneumonia but has not had a fever since. She has had a decreased appetite. No black stools. No nausea vomiting diarrhea. No localizing numbness or weakness.   Past Medical History:  Diagnosis Date  . Anginal pain [facility])    sees Dr Einar Gip.   . Arthritis    rheumatoid ..   . Diabetes mellitus without complication (HCC)    Metformin 2.3.2017  . Diverticulitis   . Dysrhythmia   . Fibromyalgia   . GERD (gastroesophageal reflux disease)   . High cholesterol   . Hypertension   . Liver disease 08- 2012   per Dr Dagmar Hait pt has enlarged liver  . Peripheral vascular disease (HCC)    legs  . Pneumonia 06/17/2016  . Sleep apnea    sleep study  oct 2012  . Stroke [facility]) 2011   [facility]    . Thyroid disease     Patient Active Problem List   Diagnosis Date Noted  . Carpal tunnel syndrome 03/24/2014  . Paresthesias/numbness 03/24/2014  . Obesity, unspecified 09/23/2013  . Pulmonary nodules 07/03/2013  . TIA (transient ischemic attack) 06/20/2013  . Left arm weakness 06/20/2013  . DM (diabetes mellitus) ([place]) 06/20/2013  . HTN (hypertension) 06/20/2013  . Chronic pain 06/20/2013  . Vitreous hemorrhage ([place]) 03/25/2012    Past Surgical History:  Procedure Laterality Date  . [place]  . CHOLECYSTECTOMY  1996  . CORONARY ARTERY BYPASS GRAFT  1986  . EYE SURGERY  2013   cat ext bilateral .  . EYE SURGERY  2002   laser  . [place]- 2007   rod right leg - right wrist plate  . GAS/FLUID EXCHANGE  03/25/2012   Procedure: GAS/FLUID EXCHANGE;  Surgeon: Hayden Pedro, MD;  Location: [facility];  Service: Ophthalmology;  Laterality: Right;  . PARS PLANA VITRECTOMY  03/25/2012   Procedure: PARS PLANA VITRECTOMY WITH 25 GAUGE;  Surgeon: Hayden Pedro, MD;  Location: [facility];  Service: Ophthalmology;  Laterality: Right;    OB History    No data available       Home Medications    Prior to Admission medications   Medication Sig Start Date End Date Taking? Authorizing Provider  Blood Glucose Monitoring Suppl (FREESTYLE LITE) DEVI See admin instructions. 12/22/14   Historical Provider, MD  ciprofloxacin (CIPRO) 500 MG tablet Take 1 tablet (500 mg total) by mouth 2 (two) times daily. 10/02/15   Lajean Saver, MD  clopidogrel (PLAVIX) 75 MG tablet Take 1 tablet (75 mg total) by mouth daily with breakfast. 12/31/14   Garvin Fila, MD  diclofenac sodium (VOLTAREN) 1 % GEL Apply 2 g topically daily as needed (Pain).  Historical Provider, MD  escitalopram (LEXAPRO) 10 MG tablet Take 10 mg by mouth daily.    Historical Provider, MD  esomeprazole (NEXIUM) 40 MG capsule Take 40 mg by mouth daily.     Historical Provider, MD  estrogen, conjugated,-medroxyprogesterone (PREMPRO) 0.625-5 MG per tablet Take 1 tablet by mouth daily.    Historical Provider, MD  Olmesartan-Amlodipine-HCTZ (TRIBENZOR) 40-5-25 MG TABS Take 1 tablet by mouth daily.    Historical Provider, MD  pravastatin (PRAVACHOL) 40 MG tablet Take 40 mg by mouth at bedtime.    Historical Provider, MD  spironolactone (ALDACTONE) 25 MG tablet Take 25 mg by mouth daily.     Historical Provider, MD  Tafluprost (ZIOPTAN) 0.0015 % SOLN Place 1 drop into both eyes daily.     Historical  Provider, MD  temazepam (RESTORIL) 30 MG capsule Take 30 mg by mouth at bedtime.    Historical Provider, MD  Tofacitinib Citrate (XELJANZ) 5 MG TABS Take 5 mg by mouth 2 (two) times daily.    Historical Provider, MD    Family History Family History  Problem Relation Age of Onset  . Hypertension Daughter   . Rheum arthritis Maternal Grandmother     Social History Social History  Substance Use Topics  . Smoking status: Former Smoker    Packs/day: 0.25    Years: 5.00    Types: Cigarettes    Quit date: 06/11/1984  . Smokeless tobacco: Never Used  . Alcohol use 0.0 oz/week     Comment: occ     Allergies   Fentanyl; Cefdinir; Lactose intolerance (gi); Butrans [buprenorphine]; Penicillins; and Simvastatin   Review of Systems Review of Systems  Constitutional: Positive for appetite change. Negative for fever.  HENT: Negative for congestion.   Respiratory: Positive for cough.   Cardiovascular: Negative for chest pain.  Gastrointestinal: Negative for abdominal pain.  Genitourinary: Negative for flank pain.  Musculoskeletal: Positive for neck pain. Negative for back pain.  Neurological: Positive for weakness and light-headedness. Negative for numbness.  Psychiatric/Behavioral: Negative for confusion.     Physical Exam Updated Vital Signs BP 90/70   Pulse 81   Temp 98.5 F (36.9 C) (Oral)   Resp 14   Ht 5\' 6"  (1.676 m)   Wt 260 lb (117.9 kg)   SpO2 99%   BMI 41.97 kg/m   Physical Exam  Constitutional: She appears well-developed.  HENT:  Head: Normocephalic.  Eyes: EOM are normal.  Neck:  Mild inferior cervical spine tenderness without step-off or deformity.  Cardiovascular: Normal rate.   Pulmonary/Chest:  Mildly harsh breath sounds. No respiratory distress.  Abdominal: Soft. There is no tenderness.  Musculoskeletal: She exhibits no tenderness.  Neurological: She is alert.  Skin: Skin is warm.  Psychiatric:  Patient somewhat anxious and states that she gets  panic attacks and is having one right now.     ED Treatments / Results  Labs (all labs ordered are listed, but only abnormal results are displayed) Labs Reviewed  CBC WITH DIFFERENTIAL/PLATELET - Abnormal; Notable for the following:       Result Value   RBC 3.40 (*)    Hemoglobin 10.8 (*)    HCT 32.3 (*)    Neutro Abs 8.5 (*)    Lymphs Abs 0.6 (*)    All other components within normal limits  COMPREHENSIVE METABOLIC PANEL - Abnormal; Notable for the following:    Sodium 130 (*)    Potassium 5.4 (*)    Chloride 99 (*)    CO2  21 (*)    Glucose, Bld 128 (*)    BUN 72 (*)    Creatinine, Ser 2.77 (*)    GFR calc non Af Amer 16 (*)    GFR calc Af Amer 19 (*)    All other components within normal limits  TROPONIN I  URINALYSIS, ROUTINE W REFLEX MICROSCOPIC  I-STAT CG4 LACTIC ACID, ED    EKG  EKG Interpretation  Date/Time:  Wednesday June 27 2016 14:54:29 EST Ventricular Rate:  94 PR Interval:    QRS Duration: 100 QT Interval:  343 QTC Calculation: 406 R Axis:   98 Text Interpretation:  Sinus rhythm Low voltage, precordial leads Probable right ventricular hypertrophy Confirmed by Alvino Chapel  MD, Ovid Curd [phone]) on 06/27/2016 3:35:13 PM       Radiology Dg Chest Portable 1 View  Result Date: 06/27/2016 CLINICAL DATA:  Cough, dizziness, and weakness. Coronary artery disease. EXAM: PORTABLE CHEST 1 VIEW COMPARISON:  06/18/2016 FINDINGS: The heart size and mediastinal contours are within normal limits. Both lungs are clear. No evidence of pneumothorax or pleural effusion. Prior CABG again noted. IMPRESSION: No active disease. Electronically Signed   By: Earle Gell M.D.   On: 06/27/2016 15:32    Procedures Procedures (including critical care time)  Medications Ordered in ED Medications  sodium chloride 0.9 % bolus 1,000 mL (0 mLs Intravenous Stopped 06/27/16 1610)  sodium chloride 0.9 % bolus 1,000 mL (1,000 mLs Intravenous New Bag/Given 06/27/16 1627)     Initial  Impression / Assessment and Plan / ED Course  I have reviewed the triage vital signs and the nursing notes.  Pertinent labs & imaging results that were available during my care of the patient were reviewed by me and considered in my medical decision making (see chart for details).  Clinical Course   Patient presents with generalized weakness and lightheadedness. Found to be hypotensive. Has been treated with Levaquin for pneumonia. Afebrile here. White count was not elevated. Blood pressure improved somewhat with IV fluids. Patient states she feels better. Has had a little bit of cough without production at this time. Creatinine has increased. I think likely the hypotension is due to dehydration and the fact she has not been eating much for the last 2 weeks. Will admit to internal medicine. Normal lactic acid.  Final Clinical Impressions(s) / ED Diagnoses   Final diagnoses:  Hypotension, unspecified hypotension type  Dehydration  Acute kidney injury [facility])    New Prescriptions New Prescriptions   No medications on file     Davonna Belling, MD 06/27/16 1745

## 2016-06-27 NOTE — H&P (Signed)
Patient Demographics:    [name], is a 71 y.o. female  MRN: [id number]   DOB - 08-Jan-1946  Admit Date - 06/27/2016  Outpatient Primary MD for the patient is Wende Neighbors, MD   Assessment & Plan:    Active Problems:   AKI (acute kidney injury) ([place])   AKI- Creatinine of 2.77 and BUN of 72 on admission, likely prerenal- due to reduced by mouth intake, and ongoing use of diuretics. With hyponatremia of 130, and hypotension with dizziness- systolic as low as [id number], which responded to total of 3 L given in the ED.  - Admit to Inpt, tele - continue IV fluids normal saline at 150 mL/hour for 1 day, reassess need and rate - With hypotension and PNA will get blood cultures considering patient is on immunomodulatory medications - am BMP- Cr, K  Prior pneumonia - diagnosed 06/18/2016, with chest x-ray- LLL airspace dx, was placed on a 10 day course of antibiotic, has 1 day left. Patient doesn't know name of medication. Chest x-ray today- 1/17, unremarkable. - Confirm name of medication, [facility] closed, 9 days of antibiotic enough for CAP, can d/c last dose.  History of essential Hypertension- patient has been hypotensive here due to dehydration and ongoing diuretic use. Home blood pressure medication include Spironolactone 25, Olmesa-Amlodi-HCTZ- 40-5-25mg . - Will hold home blood pressure medications - IV fluid hydration  Fall- patient says she slipped and fell 1 wk ago, from dizziness and wearing socks. She has had persistent neck pain since then, with worsening RUE numbness - Cervical CT Wo Contrast  Hyperkalemia- potassium of 5.4, likely related to ARB use. - Hold ARB  Chronic Anemia- mild drop to 10.8 from Baseline 11-12.. Likely due to chronic dx. - am CBC, check Hgb, trend - ferritin, Serum Fe and  TIBC   Rheumatoid arthritis- agent is on immunomodulatory drug tofacitinib, she is compliant with and takes twice a day. No sign of flare.  - continue while inpatient - Also on chronic opioids hydrocodone/acetaminophen 10/325 4 times daily.   Diabetes - blood sugars in the low to mid 100s. On metformin at home. Hgba1c- 06/21/13- 6.7. - Carb Modified diet  - sensitive SSI  Hx of CABG- in 1986. Patient is on Plavix and pravastatin. - Cont Plavix and statin while inpatient   Anxiety- she is on temazepam-30 mg daily and on SSRI escitalopram. - She is compliant with both, cont while inpt.   Menopausal symptoms- hot flashes. - Continue home conjugated estrogen with progesterone  With History of - Reviewed by me  Past Medical History:  Diagnosis Date  . Anginal pain [facility])    sees Dr Einar Gip.   . Arthritis    rheumatoid ..   . Diabetes mellitus without complication (HCC)    Metformin 2.3.2017  . Diverticulitis   . Dysrhythmia   . Fibromyalgia   . GERD (gastroesophageal reflux disease)   . High cholesterol   . Hypertension   .  Liver disease 08- 2012   per Dr Dagmar Hait pt has enlarged liver  . Peripheral vascular disease (HCC)    legs  . Pneumonia 06/17/2016  . Sleep apnea    sleep study  oct 2012  . Stroke [facility]) 2011   [facility]    . Thyroid disease       Past Surgical History:  Procedure Laterality Date  . [place]  . CHOLECYSTECTOMY  1996  . CORONARY ARTERY BYPASS GRAFT  1986  . EYE SURGERY  2013   cat ext bilateral .  . EYE SURGERY  2002   laser  . [place]- 2007   rod right leg - right wrist plate  . GAS/FLUID EXCHANGE  03/25/2012   Procedure: GAS/FLUID EXCHANGE;  Surgeon: Hayden Pedro, MD;  Location: [facility];  Service: Ophthalmology;  Laterality: Right;  . PARS PLANA VITRECTOMY  03/25/2012   Procedure: PARS PLANA VITRECTOMY WITH 25 GAUGE;  Surgeon: Hayden Pedro, MD;  Location: [place];  Service: Ophthalmology;   Laterality: Right;      Chief Complaint  Patient presents with  . Hypotension      HPI:    [name]  is a 71 y.o. female, With past medical history of obesity, hypertension rheumatoid arthritis and diabetes mellitus. Patient presented to the ED with complaints of dizziness for about 2 weeks.  About 2 weeks ago she was diagnosed with pneumonia by her PCP following a chest x-ray, she had done here. She was placed on the medication for 10 days, ?Name, she has been compliant. She endorses poor by mouth intake over the past 2 weeks, with fall. She has also been compliant with her blood pressure medications , which includes a diuretic. She has been making urine.  She had a fever at that time she was diagnosed with pneumonia and productive cough, no fever for at least 10 days and cough is improving. She denies chest pain, or shortness of breath, she denies dysuria.  In the ED- patient was hypotensive with a pressure of 85/59, temperature 98.5. She was given total of 3L of NS, with improvements in her blood pressure to 95/72, creatinine elevated at 2.7, BUN of 72, sodium of 130.  Lactic acid was normal at 1.68. Chest x-ray, UA unremarkable.   Review of systems:    In addition to the HPI above,   A full 12 point Review of 10 Systems was done, except as stated above, all other Review of 10 Systems were negative.    Social History:  Reviewed by me    Social History  Substance Use Topics  . Smoking status: Former Smoker    Packs/day: 0.25    Years: 5.00    Types: Cigarettes    Quit date: 06/11/1984  . Smokeless tobacco: Never Used  . Alcohol use 0.0 oz/week     Comment: occ     Family History :  Reviewed by me    Family History  Problem Relation Age of Onset  . Hypertension Daughter   . Rheum arthritis Maternal Grandmother      Home Medications:   Prior to Admission medications   Medication Sig Start Date End Date Taking? Authorizing Provider  Blood Glucose Monitoring  Suppl (FREESTYLE LITE) DEVI See admin instructions. 12/22/14   Historical Provider, MD  ciprofloxacin (CIPRO) 500 MG tablet Take 1 tablet (500 mg total) by mouth 2 (two) times daily. 10/02/15   Lajean Saver, MD  clopidogrel (  PLAVIX) 75 MG tablet Take 1 tablet (75 mg total) by mouth daily with breakfast. 12/31/14   Garvin Fila, MD  diclofenac sodium (VOLTAREN) 1 % GEL Apply 2 g topically daily as needed (Pain).    Historical Provider, MD  escitalopram (LEXAPRO) 10 MG tablet Take 10 mg by mouth daily.    Historical Provider, MD  esomeprazole (NEXIUM) 40 MG capsule Take 40 mg by mouth daily.     Historical Provider, MD  estrogen, conjugated,-medroxyprogesterone (PREMPRO) 0.625-5 MG per tablet Take 1 tablet by mouth daily.    Historical Provider, MD  Olmesartan-Amlodipine-HCTZ (TRIBENZOR) 40-5-25 MG TABS Take 1 tablet by mouth daily.    Historical Provider, MD  pravastatin (PRAVACHOL) 40 MG tablet Take 40 mg by mouth at bedtime.    Historical Provider, MD  spironolactone (ALDACTONE) 25 MG tablet Take 25 mg by mouth daily.     Historical Provider, MD  Tafluprost (ZIOPTAN) 0.0015 % SOLN Place 1 drop into both eyes daily.     Historical Provider, MD  temazepam (RESTORIL) 30 MG capsule Take 30 mg by mouth at bedtime.    Historical Provider, MD  Tofacitinib Citrate (XELJANZ) 5 MG TABS Take 5 mg by mouth 2 (two) times daily.    Historical Provider, MD     Allergies:     Allergies  Allergen Reactions  . Fentanyl Anaphylaxis and Shortness Of Breath  . Cefdinir     Patient isn't sure if she is allergic to this medication.  . Lactose Intolerance (Gi) Other (See Comments)    G.I. Upset  . Butrans [Buprenorphine] Rash and Other (See Comments)    Infected skin underneath application  . Penicillins Rash    Facial rash Has patient had a PCN reaction causing immediate rash, facial/tongue/throat swelling, SOB or lightheadedness with hypotension: Yes Has patient had a PCN reaction causing severe rash  involving mucus membranes or skin necrosis: No Has patient had a PCN reaction that required hospitalization No Has patient had a PCN reaction occurring within the last 10 years: Yes If all of the above answers are "NO", then may proceed with Cephalosporin use.   . Simvastatin Rash     Physical Exam:   Vitals  Blood pressure 90/70, pulse 81, temperature 98.5 F (36.9 C), temperature source Oral, resp. rate 14, height 5\' 6"  (1.676 m), weight 117.9 kg (260 lb), SpO2 99 %.  Physical Examination: General appearance - alert, well appearing, and in no distress Mental status - alert, oriented to person, place, and time Eyes - sclera anicteric, PERRL Neck - supple, no tenderness on palpation of neck, but pt obese. Chest - clear  to auscultation bilaterally, symmetrical air movement, Heart - S1 and S2 normal,  no murmurs  Abdomen - soft, obese, minimal tenderness suprapubic area, nondistended, no masses or organomegaly Neurological - screening mental status exam normal, neck supple without rigidity, cranial nerves II through XII intact, DTR's normal and symmetric, strength 5/ 5 all extremities  Extremities - no pedal edema noted, warm and well-perfused  Skin - warm, dry   Data Review:    CBC  Recent Labs Lab 06/27/16 1517  WBC 10.1  HGB 10.8*  HCT 32.3*  PLT 283  MCV 95.0  MCH 31.8  MCHC 33.4  RDW 13.7  LYMPHSABS 0.6*  MONOABS 0.9  EOSABS 0.1  BASOSABS 0.0   ------------------------------------------------------------------------------------------------------------------  Chemistries   Recent Labs Lab 06/27/16 1517  NA 130*  K 5.4*  CL 99*  CO2 21*  GLUCOSE 128*  BUN 72*  CREATININE 2.77*  CALCIUM 8.9  AST 24  ALT 29  ALKPHOS 38  BILITOT 0.8   ------------------------------------------------------------------------------  Cardiac Enzymes  Recent Labs Lab 06/27/16 1517  TROPONINI <0.03    ------------------------------------------------------------------------------------------------------------------    Component Value Date/Time   BNP 34.0 06/14/2014 1415     ---------------------------------------------------------------------------------------------------------------  Urinalysis    Component Value Date/Time   COLORURINE YELLOW 10/02/2015 0908   APPEARANCEUR HAZY (A) 10/02/2015 0908   LABSPEC 1.010 10/02/2015 0908   PHURINE 5.5 10/02/2015 0908   GLUCOSEU NEGATIVE 10/02/2015 0908   HGBUR TRACE (A) 10/02/2015 0908   BILIRUBINUR NEGATIVE 10/02/2015 0908   KETONESUR NEGATIVE 10/02/2015 0908   PROTEINUR NEGATIVE 10/02/2015 0908   UROBILINOGEN 0.2 06/20/2013 1832   NITRITE NEGATIVE 10/02/2015 0908   LEUKOCYTESUR LARGE (A) 10/02/2015 0908    ----------------------------------------------------------------------------------------------------------------   Imaging Results:    Dg Chest Portable 1 View  Result Date: 06/27/2016 CLINICAL DATA:  Cough, dizziness, and weakness. Coronary artery disease. EXAM: PORTABLE CHEST 1 VIEW COMPARISON:  06/18/2016 FINDINGS: The heart size and mediastinal contours are within normal limits. Both lungs are clear. No evidence of pneumothorax or pleural effusion. Prior CABG again noted. IMPRESSION: No active disease. Electronically Signed   By: Earle Gell M.D.   On: 06/27/2016 15:32    Radiological Exams on Admission: Dg Chest Portable 1 View  Result Date: 06/27/2016 CLINICAL DATA:  Cough, dizziness, and weakness. Coronary artery disease. EXAM: PORTABLE CHEST 1 VIEW COMPARISON:  06/18/2016 FINDINGS: The heart size and mediastinal contours are within normal limits. Both lungs are clear. No evidence of pneumothorax or pleural effusion. Prior CABG again noted. IMPRESSION: No active disease. Electronically Signed   By: Earle Gell M.D.   On: 06/27/2016 15:32    DVT Prophylaxis -Heparin  AM Labs Ordered, also please review Full  Orders  Family Communication: Admission, patients condition and plan of care including tests being ordered have been discussed with the patient and  who indicate understanding and agree with the plan   Code Status - Full Code  Likely DC to  Home  Condition   STAble  Bethena Roys M.D on 06/27/2016 at 5:54 PM   Between 7am to 7pm - Pager - [phone]  After 7pm go to [email or web address] - password [email or web address]  [facility] - Office  [phone]  Dragon dictation system was used to create this note, attempts have been made to correct errors, however presence of uncorrected errors is not a reflection quality of care provided.

## 2016-06-27 NOTE — ED Notes (Signed)
Pt refused rectal temp.

## 2016-06-28 DIAGNOSIS — M069 Rheumatoid arthritis, unspecified: Secondary | ICD-10-CM

## 2016-06-28 DIAGNOSIS — I10 Essential (primary) hypertension: Secondary | ICD-10-CM

## 2016-06-28 LAB — CBC
HCT: 31.3 % — ABNORMAL LOW (ref 36.0–46.0)
HEMOGLOBIN: 10.5 g/dL — AB (ref 12.0–15.0)
MCH: 31.9 pg (ref 26.0–34.0)
MCHC: 33.5 g/dL (ref 30.0–36.0)
MCV: 95.1 fL (ref 78.0–100.0)
PLATELETS: 286 10*3/uL (ref 150–400)
RBC: 3.29 MIL/uL — ABNORMAL LOW (ref 3.87–5.11)
RDW: 13.8 % (ref 11.5–15.5)
WBC: 8.7 10*3/uL (ref 4.0–10.5)

## 2016-06-28 LAB — BASIC METABOLIC PANEL
ANION GAP: 10 (ref 5–15)
BUN: 49 mg/dL — ABNORMAL HIGH (ref 6–20)
CALCIUM: 8.2 mg/dL — AB (ref 8.9–10.3)
CO2: 19 mmol/L — ABNORMAL LOW (ref 22–32)
Chloride: 108 mmol/L (ref 101–111)
Creatinine, Ser: 1.78 mg/dL — ABNORMAL HIGH (ref 0.44–1.00)
GFR calc Af Amer: 32 mL/min — ABNORMAL LOW (ref >60)
GFR, EST NON AFRICAN AMERICAN: 28 mL/min — AB (ref >60)
GLUCOSE: 117 mg/dL — AB (ref 65–99)
Potassium: 4.7 mmol/L (ref 3.5–5.1)
Sodium: 137 mmol/L (ref 135–145)

## 2016-06-28 LAB — GLUCOSE, CAPILLARY
GLUCOSE-CAPILLARY: 138 mg/dL — AB (ref 65–99)
Glucose-Capillary: 122 mg/dL — ABNORMAL HIGH (ref 65–99)
Glucose-Capillary: 134 mg/dL — ABNORMAL HIGH (ref 65–99)
Glucose-Capillary: 129 mg/dL — ABNORMAL HIGH (ref 65–99)

## 2016-06-28 MED ORDER — SALINE SPRAY 0.65 % NA SOLN: 1.0000 | NASAL | Status: DC | PRN

## 2016-06-28 NOTE — Progress Notes (Signed)
Initial Nutrition Assessment  DOCUMENTATION CODES:  Morbid obesity  INTERVENTION:  Ensure Enlive po BID, each supplement provides 350 kcal and 20 grams of protein  Food requests  NUTRITION DIAGNOSIS:  Inadequate oral intake related to poor appetite, acute illness as evidenced by per patient/family report.  GOAL:  Patient will meet greater than or equal to 90% of their needs  MONITOR:  PO intake, Supplement acceptance  REASON FOR ASSESSMENT:  Malnutrition Screening Tool    ASSESSMENT:  71 y/o female, PMHx HTN, DM, GERD, PVD, OSA, CVA. Presented with dizziness and lightheadedness x2 weeks.  Also fell 4 days ago and has pain in R chest and neck. Reports she recently had pneumonia. Has decreased appetite. Worked up for AKI, likely r/t poor PO intake  On RD arrival, there was a very large number of individuals in room. Did not divulge any patient history or ask about how she was doing PTA.   Focused on what could be done now to improve PO intake. She says she has no appetite what so ever. She does not think there is any food that she wants at this time. She has been drinking Ensure. She likes these if they are cold. She also asked for just buttered toast and unsweetened tea.   She had a large gap in her weight history, unable to determine if any wt loss attributed to this acute illness.   NFPE: Deferred.   Labs: Renal labs improved, K now WDL, BG 120-140 mg/dl. a1c: 06/21/13- 6.7. Medications: PPI, IVF   Recent Labs Lab 06/27/16 1517 06/28/16 0523  NA 130* 137  K 5.4* 4.7  CL 99* 108  CO2 21* 19*  BUN 72* 49*  CREATININE 2.77* 1.78*  CALCIUM 8.9 8.2*  GLUCOSE 128* 117*    Diet Order:  Diet Carb Modified Fluid consistency: Thin; Room service appropriate? Yes  Skin:  Reviewed, no issues  Last BM:  1/16  Height:  Ht Readings from Last 1 Encounters:  06/27/16 5\' 6"  (1.676 m)   Weight:  Wt Readings from Last 1 Encounters:  06/27/16 264 lb 9.6 oz (120 kg)   Wt  Readings from Last 10 Encounters:  06/27/16 264 lb 9.6 oz (120 kg)  10/02/15 270 lb (122.5 kg)  03/07/15 276 lb (125.2 kg)  01/24/15 276 lb (125.2 kg)  12/31/14 276 lb (125.2 kg)  06/14/14 270 lb (122.5 kg)  04/29/14 275 lb (124.7 kg)  03/24/14 276 lb (125.2 kg)  01/25/14 271 lb (122.9 kg)  12/12/13 260 lb (117.9 kg)   Ideal Body Weight:  59.1 kg  BMI:  Body mass index is 42.71 kg/m.  Estimated Nutritional Needs:  Kcal:  1600-1800 kcals Protein:  65-77 g (1.1-1.3 g/kg ibw) Fluid:  1.6-1.8 L   EDUCATION NEEDS:  No education needs identified at this time  Burtis Junes RD, LDN, [place] Clinical Nutrition Pager: [phone] 06/28/2016 3:46 PM

## 2016-06-28 NOTE — Progress Notes (Signed)
Dr. Truman Hayward made aware of 3 beat run of Vtach. Order to d/c tele.

## 2016-06-28 NOTE — Progress Notes (Signed)
Pt  Requesting something for congestion and nasal drip. Dr. Truman Hayward paged.

## 2016-06-28 NOTE — Progress Notes (Signed)
Pt has BP 92/37, asymptomatic. MD aware, will continue to monitor.

## 2016-06-28 NOTE — Progress Notes (Signed)
PROGRESS NOTE    [name]  [id number] DOB: [birth year] DOA: 06/27/2016 PCP: Wende Neighbors, MD    Brief Narrative: patient 71 yo with hx of DM, HTN, RA, admitted with AKI felt to be pre renal.  Her admission Cr was 2.7, baseline 1.1, and improved today to 1.78. HCTZ held. Her Olmesa Amlodipine HCTZ and Aldactone was held.    Assessment & Plan:   Principal Problem:   AKI (acute kidney injury) ([place]) Active Problems:   DM (diabetes mellitus) ([place])   HTN (hypertension)   Rheumatoid arthritis ([place])  History of essential Hypertension- patient has been hypotensive here due to dehydration and ongoing diuretic use. Home blood pressure medication include Spironolactone 25, Olmesa-Amlodi-HCTZ- 40-5-25mg . - Will hold home blood pressure medications - IV fluid hydration   Hyperkalemia- potassium of 5.4, likely related to ARB use. - Hold ARB, resolved.   Chronic Anemia- mild drop to 10.8 from Baseline 11-12.. Likely due to chronic dx. - am CBC, check Hgb, trend - ferritin, Serum Fe and TIBC   Rheumatoid arthritis- agent is on immunomodulatory drug tofacitinib, she is compliant with and takes twice a day. No sign of flare.  - continue while inpatient - Also on chronic opioids hydrocodone/acetaminophen 10/325 4 times daily.   Diabetes - blood sugars in the low to mid 100s. On metformin at home. Hgba1c- 06/21/13- 6.7. - Carb Modified diet  - sensitive SSI  Hx of CABG- in 1986. Patient is on Plavix and pravastatin. - Cont Plavix and statin while inpatient   Anxiety- she is on temazepam-30 mg daily and on SSRI escitalopram. - She is compliant with both, cont while inpt.    DVT prophylaxis: Heparin.  Code Status: FULL CODE. Family Communication: All members of family. Disposition Plan: To home when appropriate  Consultants:   None.   Procedures:   None.   Antimicrobials: Anti-infectives    None       Subjective:  Feeling better.   Wanting to go home.    Objective: Vitals:   06/27/16 2048 06/28/16 0610 06/28/16 0804 06/28/16 1400  BP:  (!) 92/37 (!) 103/56 (!) 138/59  Pulse:  89 95 98  Resp: 18 18    Temp:  98.4 F (36.9 C) 98.4 F (36.9 C) 98.5 F (36.9 C)  TempSrc: Oral Oral  Oral  SpO2:  95% 96% 97%  Weight: 120 kg (264 lb 9.6 oz)     Height: 5\' 6"  (1.676 m)       Intake/Output Summary (Last 24 hours) at 06/28/16 1519 Last data filed at 06/28/16 1518  Gross per 24 hour  Intake           4572.5 ml  Output              600 ml  Net           3972.5 ml   Filed Weights   06/27/16 1451 06/27/16 2048  Weight: 117.9 kg (260 lb) 120 kg (264 lb 9.6 oz)    Examination:  General exam: Appears calm and comfortable  Respiratory system: Clear to auscultation. Respiratory effort normal. Cardiovascular system: S1 & S2 heard, RRR. No JVD, murmurs, rubs, gallops or clicks. No pedal edema. Gastrointestinal system: Abdomen is nondistended, soft and nontender. No organomegaly or masses felt. Normal bowel sounds heard. Central nervous system: Alert and oriented. No focal neurological deficits. Extremities: Symmetric 5 x 5 power. Skin: No rashes, lesions or ulcers Psychiatry: Judgement and insight appear normal. Mood & affect appropriate.  Data Reviewed: I have personally reviewed following labs and imaging studies  CBC:  Recent Labs Lab 06/27/16 1517 06/28/16 0523  WBC 10.1 8.7  NEUTROABS 8.5*  --   HGB 10.8* 10.5*  HCT 32.3* 31.3*  MCV 95.0 95.1  PLT 283 [id number]   Basic Metabolic Panel:  Recent Labs Lab 06/27/16 1517 06/28/16 0523  NA 130* 137  K 5.4* 4.7  CL 99* 108  CO2 21* 19*  GLUCOSE 128* 117*  BUN 72* 49*  CREATININE 2.77* 1.78*  CALCIUM 8.9 8.2*   GFR: Estimated Creatinine Clearance: 38.3 mL/min (by C-G formula based on SCr of 1.78 mg/dL (H)). Liver Function Tests:  Recent Labs Lab 06/27/16 1517  AST 24  ALT 29  ALKPHOS 38  BILITOT 0.8  PROT 6.7  ALBUMIN 3.5    Recent Labs Lab 06/27/16 1517   TROPONINI <0.03   CBG:  Recent Labs Lab 06/28/16 0752 06/28/16 1231  GLUCAP 122* 138*   Sepsis Labs:  Recent Labs Lab 06/27/16 1554  LATICACIDVEN 1.68    Recent Results (from the past 240 hour(s))  Culture, blood (routine x 2)     Status: None (Preliminary result)   Collection Time: 06/27/16  9:05 PM  Result Value Ref Range Status   Specimen Description BLOOD RIGHT ARM  Final   Special Requests BOTTLES DRAWN AEROBIC AND ANAEROBIC 6CC EACH  Final   Culture NO GROWTH < 12 HOURS  Final   Report Status PENDING  Incomplete  Culture, blood (routine x 2)     Status: None (Preliminary result)   Collection Time: 06/27/16  9:20 PM  Result Value Ref Range Status   Specimen Description BLOOD LEFT ARM  Final   Special Requests BOTTLES DRAWN AEROBIC AND ANAEROBIC 4CC EACH  Final   Culture NO GROWTH < 12 HOURS  Final   Report Status PENDING  Incomplete     Radiology Studies: Ct Cervical Spine Wo Contrast  Result Date: 06/27/2016 CLINICAL DATA:  Posterior neck pain, fell 2 weeks ago striking back of neck on table. EXAM: CT CERVICAL SPINE WITHOUT CONTRAST TECHNIQUE: Multidetector CT imaging of the cervical spine was performed without intravenous contrast. Multiplanar CT image reconstructions were also generated. COMPARISON:  MRI cervical spine July 08, 2013 FINDINGS: Large body habitus results in overall noisy image quality. ALIGNMENT: Straightened lordosis. Vertebral bodies in alignment. SKULL BASE AND VERTEBRAE: Cervical vertebral bodies and posterior elements are intact. Vascular channel RIGHT C7 spinous process. Moderate C6-7, mild C5-6 disc height loss is similar with similar C3-4 thru C6-7 endplate spurring. Mild to moderate facet arthropathy. No destructive bony lesions. C1-2 articulation maintained, mild osteoarthrosis. SOFT TISSUES AND SPINAL CANAL: Nonacute. Moderate calcific atherosclerosis the carotid siphons. DISC LEVELS: No significant osseous canal stenosis. Moderate C5-6 and  C6-7 neural foraminal narrowing. UPPER CHEST: Lung apices are clear. OTHER: Patient is edentulous. Median sternotomy. Surgical clips at the thoracic inlet. IMPRESSION: No acute fracture or malalignment on this habitus limited examination. Electronically Signed   By: Elon Alas M.D.   On: 06/27/2016 21:18   Dg Chest Portable 1 View  Result Date: 06/27/2016 CLINICAL DATA:  Cough, dizziness, and weakness. Coronary artery disease. EXAM: PORTABLE CHEST 1 VIEW COMPARISON:  06/18/2016 FINDINGS: The heart size and mediastinal contours are within normal limits. Both lungs are clear. No evidence of pneumothorax or pleural effusion. Prior CABG again noted. IMPRESSION: No active disease. Electronically Signed   By: Earle Gell M.D.   On: 06/27/2016 15:32    Scheduled Meds: .  clopidogrel  75 mg Oral Q breakfast  . escitalopram  10 mg Oral Daily  . estrogens (conjugated)  0.625 mg Oral Daily  . feeding supplement (ENSURE ENLIVE)  237 mL Oral BID BM  . heparin  5,000 Units Subcutaneous Q8H  . insulin aspart  0-9 Units Subcutaneous TID WC  . latanoprost  1 drop Both Eyes QHS  . medroxyPROGESTERone  5 mg Oral Daily  . pantoprazole  40 mg Oral Daily  . pravastatin  40 mg Oral QHS  . Tofacitinib Citrate  5 mg Oral BID   Continuous Infusions: . sodium chloride 150 mL/hr at 06/28/16 1237     LOS: 1 day   Jassmin Kemmerer, MD FACP Hospitalist.   If 7PM-7AM, please contact night-coverage [email or web address] Password [facility] 06/28/2016, 3:19 PM

## 2016-06-29 LAB — BASIC METABOLIC PANEL
ANION GAP: 9 (ref 5–15)
BUN: 26 mg/dL — ABNORMAL HIGH (ref 6–20)
CO2: 20 mmol/L — ABNORMAL LOW (ref 22–32)
Calcium: 8 mg/dL — ABNORMAL LOW (ref 8.9–10.3)
Chloride: 109 mmol/L (ref 101–111)
Creatinine, Ser: 1.22 mg/dL — ABNORMAL HIGH (ref 0.44–1.00)
GFR calc Af Amer: 50 mL/min — ABNORMAL LOW (ref >60)
GFR, EST NON AFRICAN AMERICAN: 43 mL/min — AB (ref >60)
GLUCOSE: 104 mg/dL — AB (ref 65–99)
Potassium: 4.1 mmol/L (ref 3.5–5.1)
SODIUM: 138 mmol/L (ref 135–145)

## 2016-06-29 LAB — GLUCOSE, CAPILLARY: Glucose-Capillary: 115 mg/dL — ABNORMAL HIGH (ref 65–99)

## 2016-06-29 MED ORDER — SENNOSIDES-DOCUSATE SODIUM 8.6-50 MG PO TABS: 1.0000 | ORAL_TABLET | Freq: Every evening | ORAL | 1 refills | Status: DC | PRN

## 2016-06-29 MED ORDER — MEDROXYPROGESTERONE ACETATE 5 MG PO TABS: 5.0000 mg | ORAL_TABLET | Freq: Every day | ORAL | 0 refills | Status: DC

## 2016-06-29 NOTE — Discharge Summary (Signed)
Physician Discharge Summary  [name] [id number] DOB: August 31, 1945 DOA: 06/27/2016  PCP: Wende Neighbors, MD  Admit date: 06/27/2016 Discharge date: 06/29/2016  Admitted From: Home.  Disposition:  Home.   Recommendations for Outpatient Follow-up:  1. Follow up with PCP in 1-2 weeks  Home Health: None.  Equipment/Devices: None.  Discharge Condition: Improved.  Cr of 1.29 CODE STATUS: FULL CODE.  Diet recommendation: carb modified cardiac diet.   Brief/Interim Summary: patient was admitted for AKI by Dr Denton Brick on Jun 27, 2016.  As per his prior H and P:  "  [name]  is a 71 y.o. female, With past medical history of obesity, hypertension rheumatoid arthritis and diabetes mellitus. Patient presented to the ED with complaints of dizziness for about 2 weeks.  About 2 weeks ago she was diagnosed with pneumonia by her PCP following a chest x-ray, she had done here. She was placed on the medication for 10 days, ?Name, she has been compliant. She endorses poor by mouth intake over the past 2 weeks, with fall. She has also been compliant with her blood pressure medications , which includes a diuretic. She has been making urine.  She had a fever at that time she was diagnosed with pneumonia and productive cough, no fever for at least 10 days and cough is improving. She denies chest pain, or shortness of breath, she denies dysuria.  In the ED- patient was hypotensive with a pressure of 85/59, temperature 98.5. She was given total of 3L of NS, with improvements in her blood pressure to 95/72, creatinine elevated at 2.7, BUN of 72, sodium of 130.  Lactic acid was normal at 1.68. Chest x-ray, UA unremarkable.  HOSPITAL COURSE:  Patient was admitted for AKI, felt to be due to volume depletion and due to ACE I.  She was given IVF, and her Tribenzor medication was discontinued.  Her aldactone was held as well.  Given IVF, her Cr went from 2.7, to 1.29 after 2 days of hydration.  Her pulmonary system  was unremarkable, and her prior antibiotic was not continued.  She feels well and is anxious to go home.  She will be discharged to home today.  Her meds will be adjusted as followed.  No ACE I or ARB for now.  Suggest resuming Norvasc pending BP response.  Now it is only 100, and she doesn't need any BP meds.  No diuretics.  She will see her PCP next week for follow up.  Thank you so much for allowing me to participate in her care.  Good Day.   Discharge Diagnoses:  Principal Problem:   AKI (acute kidney injury) ([place]) Active Problems:   DM (diabetes mellitus) ([place])   HTN (hypertension)   Rheumatoid arthritis ([place])    Discharge Instructions  Discharge Instructions    Diet - low sodium heart healthy    Complete by:  As directed    Increase activity slowly    Complete by:  As directed      Allergies as of 06/29/2016      Reactions   Fentanyl Anaphylaxis, Shortness Of Breath   Cefdinir    Patient isn't sure if she is allergic to this medication.   Lactose Intolerance (gi) Other (See Comments)   G.IUnice Cobble [buprenorphine] Rash, Other (See Comments)   Infected skin underneath application   Penicillins Rash   Facial rash Has patient had a PCN reaction causing immediate rash, facial/tongue/throat swelling, SOB or lightheadedness with hypotension:  Yes Has patient had a PCN reaction causing severe rash involving mucus membranes or skin necrosis: No Has patient had a PCN reaction that required hospitalization No Has patient had a PCN reaction occurring within the last 10 years: Yes If all of the above answers are "NO", then may proceed with Cephalosporin use.   Simvastatin Rash      Medication List    STOP taking these medications   levofloxacin 750 MG tablet Commonly known as:  LEVAQUIN   spironolactone 25 MG tablet Commonly known as:  ALDACTONE   TRIBENZOR 40-5-25 MG Tabs Generic drug:  Olmesartan-Amlodipine-HCTZ     TAKE these medications   clopidogrel 75 MG  tablet Commonly known as:  PLAVIX Take 1 tablet (75 mg total) by mouth daily with breakfast.   diclofenac sodium 1 % Gel Commonly known as:  VOLTAREN Apply 2 g topically daily as needed (Pain).   escitalopram 10 MG tablet Commonly known as:  LEXAPRO Take 10 mg by mouth daily.   esomeprazole 40 MG capsule Commonly known as:  NEXIUM Take 40 mg by mouth daily.   estrogen (conjugated)-medroxyprogesterone 0.625-5 MG tablet Commonly known as:  PREMPRO Take 1 tablet by mouth daily.   FREESTYLE LITE Kerrin Mo See admin instructions.   HYDROcodone-acetaminophen 10-325 MG tablet Commonly known as:  NORCO Take 1 tablet by mouth 4 (four) times daily.   medroxyPROGESTERone 5 MG tablet Commonly known as:  PROVERA Take 1 tablet (5 mg total) by mouth daily. Start taking on:  06/30/2016   pravastatin 40 MG tablet Commonly known as:  PRAVACHOL Take 40 mg by mouth at bedtime.   senna-docusate 8.6-50 MG tablet Commonly known as:  Senokot-S Take 1 tablet by mouth at bedtime as needed for mild constipation.   temazepam 30 MG capsule Commonly known as:  RESTORIL Take 30 mg by mouth at bedtime.   XELJANZ 5 MG Tabs Generic drug:  Tofacitinib Citrate Take 5 mg by mouth 2 (two) times daily.   ZIOPTAN 0.0015 % Soln Generic drug:  Tafluprost Place 1 drop into both eyes at bedtime.       Allergies  Allergen Reactions  . Fentanyl Anaphylaxis and Shortness Of Breath  . Cefdinir     Patient isn't sure if she is allergic to this medication.  . Lactose Intolerance (Gi) Other (See Comments)    G.I. Upset  . Butrans [Buprenorphine] Rash and Other (See Comments)    Infected skin underneath application  . Penicillins Rash    Facial rash Has patient had a PCN reaction causing immediate rash, facial/tongue/throat swelling, SOB or lightheadedness with hypotension: Yes Has patient had a PCN reaction causing severe rash involving mucus membranes or skin necrosis: No Has patient had a PCN reaction  that required hospitalization No Has patient had a PCN reaction occurring within the last 10 years: Yes If all of the above answers are "NO", then may proceed with Cephalosporin use.   . Simvastatin Rash    Consultations:  None;    Procedures/Studies: Dg Chest 2 View  Result Date: 06/18/2016 CLINICAL DATA:  71 year old female with cough and fever. History of angina, diabetes hypertension and CABG EXAM: CHEST  2 VIEW COMPARISON:  Prior chest x-ray 06/14/2014 FINDINGS: Cardiac and mediastinal contours are within normal limits. Patient is status post median sternotomy with evidence of prior multivessel CABG. Focal patchy airspace opacity in the left lower lobe best visualized on the lateral view concerning for bronchopneumonia. New mild background bronchitic changes. Otherwise, the lungs are clear. No  acute osseous abnormality. IMPRESSION: 1. Patchy left lower lobe airspace opacity concerning for bronchopneumonia. 2. Otherwise, stable chest x-ray. Electronically Signed   By: Jacqulynn Cadet M.D.   On: 06/18/2016 16:51   Ct Cervical Spine Wo Contrast  Result Date: 06/27/2016 CLINICAL DATA:  Posterior neck pain, fell 2 weeks ago striking back of neck on table. EXAM: CT CERVICAL SPINE WITHOUT CONTRAST TECHNIQUE: Multidetector CT imaging of the cervical spine was performed without intravenous contrast. Multiplanar CT image reconstructions were also generated. COMPARISON:  MRI cervical spine July 08, 2013 FINDINGS: Large body habitus results in overall noisy image quality. ALIGNMENT: Straightened lordosis. Vertebral bodies in alignment. SKULL BASE AND VERTEBRAE: Cervical vertebral bodies and posterior elements are intact. Vascular channel RIGHT C7 spinous process. Moderate C6-7, mild C5-6 disc height loss is similar with similar C3-4 thru C6-7 endplate spurring. Mild to moderate facet arthropathy. No destructive bony lesions. C1-2 articulation maintained, mild osteoarthrosis. SOFT TISSUES AND SPINAL  CANAL: Nonacute. Moderate calcific atherosclerosis the carotid siphons. DISC LEVELS: No significant osseous canal stenosis. Moderate C5-6 and C6-7 neural foraminal narrowing. UPPER CHEST: Lung apices are clear. OTHER: Patient is edentulous. Median sternotomy. Surgical clips at the thoracic inlet. IMPRESSION: No acute fracture or malalignment on this habitus limited examination. Electronically Signed   By: Elon Alas M.D.   On: 06/27/2016 21:18   Dg Chest Portable 1 View  Result Date: 06/27/2016 CLINICAL DATA:  Cough, dizziness, and weakness. Coronary artery disease. EXAM: PORTABLE CHEST 1 VIEW COMPARISON:  06/18/2016 FINDINGS: The heart size and mediastinal contours are within normal limits. Both lungs are clear. No evidence of pneumothorax or pleural effusion. Prior CABG again noted. IMPRESSION: No active disease. Electronically Signed   By: Earle Gell M.D.   On: 06/27/2016 15:32       Subjective: Feels well.   Discharge Exam: Vitals:   06/28/16 2044 06/29/16 0645  BP: 100/63 100/69  Pulse: 100 85  Resp: 20 20  Temp: 98.1 F (36.7 C) 98.3 F (36.8 C)   Vitals:   06/28/16 1631 06/28/16 2044 06/28/16 2321 06/29/16 0645  BP: 128/60 100/63  100/69  Pulse: 97 100  85  Resp:  20  20  Temp:  98.1 F (36.7 C)  98.3 F (36.8 C)  TempSrc:  Oral  Oral  SpO2:  99% 99% 98%  Weight:      Height:        General: Pt is alert, awake, not in acute distress Cardiovascular: RRR, S1/S2 +, no rubs, no gallops Respiratory: CTA bilaterally, no wheezing, no rhonchi Abdominal: Soft, NT, ND, bowel sounds + Extremities: no edema, no cyanosis    The results of significant diagnostics from this hospitalization (including imaging, microbiology, ancillary and laboratory) are listed below for reference.     Microbiology: Recent Results (from the past 240 hour(s))  Culture, blood (routine x 2)     Status: None (Preliminary result)   Collection Time: 06/27/16  9:05 PM  Result Value Ref  Range Status   Specimen Description BLOOD RIGHT ARM  Final   Special Requests BOTTLES DRAWN AEROBIC AND ANAEROBIC [place]  Final   Culture NO GROWTH 2 DAYS  Final   Report Status PENDING  Incomplete  Culture, blood (routine x 2)     Status: None (Preliminary result)   Collection Time: 06/27/16  9:20 PM  Result Value Ref Range Status   Specimen Description BLOOD LEFT ARM  Final   Special Requests BOTTLES DRAWN AEROBIC AND ANAEROBIC [place]  Final   Culture NO GROWTH 2 DAYS  Final   Report Status PENDING  Incomplete     Labs: BNP (last 3 results) No results for input(s): BNP in the last 8760 hours. Basic Metabolic Panel:  Recent Labs Lab 06/27/16 1517 06/28/16 0523 06/29/16 0423  NA 130* 137 138  K 5.4* 4.7 4.1  CL 99* 108 109  CO2 21* 19* 20*  GLUCOSE 128* 117* 104*  BUN 72* 49* 26*  CREATININE 2.77* 1.78* 1.22*  CALCIUM 8.9 8.2* 8.0*   Liver Function Tests:  Recent Labs Lab 06/27/16 1517  AST 24  ALT 29  ALKPHOS 38  BILITOT 0.8  PROT 6.7  ALBUMIN 3.5   CBC:  Recent Labs Lab 06/27/16 1517 06/28/16 0523  WBC 10.1 8.7  NEUTROABS 8.5*  --   HGB 10.8* 10.5*  HCT 32.3* 31.3*  MCV 95.0 95.1  PLT 283 286   Cardiac Enzymes:  Recent Labs Lab 06/27/16 1517  TROPONINI <0.03   BNP: Invalid input(s): POCBNP CBG:  Recent Labs Lab 06/28/16 0752 06/28/16 1231 06/28/16 1641 06/28/16 2154 06/29/16 0742  GLUCAP 122* 138* 134* 129* 115*    Urinalysis    Component Value Date/Time   COLORURINE YELLOW 06/27/2016 1700   APPEARANCEUR HAZY (A) 06/27/2016 1700   LABSPEC 1.020 06/27/2016 1700   PHURINE 5.5 06/27/2016 1700   GLUCOSEU NEGATIVE 06/27/2016 1700   HGBUR NEGATIVE 06/27/2016 1700   BILIRUBINUR NEGATIVE 06/27/2016 1700   KETONESUR NEGATIVE 06/27/2016 1700   PROTEINUR NEGATIVE 06/27/2016 1700   UROBILINOGEN 0.2 06/20/2013 1832   NITRITE NEGATIVE 06/27/2016 1700   LEUKOCYTESUR NEGATIVE 06/27/2016 1700   Microbiology Recent Results (from the  past 240 hour(s))  Culture, blood (routine x 2)     Status: None (Preliminary result)   Collection Time: 06/27/16  9:05 PM  Result Value Ref Range Status   Specimen Description BLOOD RIGHT ARM  Final   Special Requests BOTTLES DRAWN AEROBIC AND ANAEROBIC 6CC EACH  Final   Culture NO GROWTH 2 DAYS  Final   Report Status PENDING  Incomplete  Culture, blood (routine x 2)     Status: None (Preliminary result)   Collection Time: 06/27/16  9:20 PM  Result Value Ref Range Status   Specimen Description BLOOD LEFT ARM  Final   Special Requests BOTTLES DRAWN AEROBIC AND ANAEROBIC 4CC EACH  Final   Culture NO GROWTH 2 DAYS  Final   Report Status PENDING  Incomplete    Time coordinating discharge: Over 30 minutes SIGNED:  Orvan Falconer, MD FACP [facility] 06/29/2016, 11:05 AM   If 7PM-7AM, please contact night-coverage [email or web address] Password [email or web address]

## 2016-06-29 NOTE — Progress Notes (Signed)
Patient being d/c home with instructions. IV cath removed and intact. No c/o pain at this time or at site. Patient verbalizes understanding.

## 2016-07-02 LAB — CULTURE, BLOOD (ROUTINE X 2)
CULTURE: NO GROWTH
CULTURE: NO GROWTH

## 2016-07-11 DIAGNOSIS — R5383 Other fatigue: Secondary | ICD-10-CM | POA: Diagnosis not present

## 2016-07-11 DIAGNOSIS — I10 Essential (primary) hypertension: Secondary | ICD-10-CM | POA: Diagnosis not present

## 2016-07-11 DIAGNOSIS — E119 Type 2 diabetes mellitus without complications: Secondary | ICD-10-CM | POA: Diagnosis not present

## 2016-07-11 DIAGNOSIS — F5101 Primary insomnia: Secondary | ICD-10-CM | POA: Diagnosis not present

## 2016-07-11 DIAGNOSIS — Z09 Encounter for follow-up examination after completed treatment for conditions other than malignant neoplasm: Secondary | ICD-10-CM | POA: Diagnosis not present

## 2016-07-11 DIAGNOSIS — R944 Abnormal results of kidney function studies: Secondary | ICD-10-CM | POA: Diagnosis not present

## 2016-07-11 DIAGNOSIS — J189 Pneumonia, unspecified organism: Secondary | ICD-10-CM | POA: Diagnosis not present

## 2016-07-16 DIAGNOSIS — N178 Other acute kidney failure: Secondary | ICD-10-CM | POA: Diagnosis not present

## 2016-07-16 DIAGNOSIS — J189 Pneumonia, unspecified organism: Secondary | ICD-10-CM | POA: Diagnosis not present

## 2016-07-16 DIAGNOSIS — E119 Type 2 diabetes mellitus without complications: Secondary | ICD-10-CM | POA: Diagnosis not present

## 2016-07-16 DIAGNOSIS — R5383 Other fatigue: Secondary | ICD-10-CM | POA: Diagnosis not present

## 2016-07-16 DIAGNOSIS — F5101 Primary insomnia: Secondary | ICD-10-CM | POA: Diagnosis not present

## 2016-07-16 DIAGNOSIS — D649 Anemia, unspecified: Secondary | ICD-10-CM | POA: Diagnosis not present

## 2016-07-16 DIAGNOSIS — I2510 Atherosclerotic heart disease of native coronary artery without angina pectoris: Secondary | ICD-10-CM | POA: Diagnosis not present

## 2016-07-18 DIAGNOSIS — I2510 Atherosclerotic heart disease of native coronary artery without angina pectoris: Secondary | ICD-10-CM | POA: Diagnosis not present

## 2016-07-18 DIAGNOSIS — R0602 Shortness of breath: Secondary | ICD-10-CM | POA: Diagnosis not present

## 2016-07-18 DIAGNOSIS — N183 Chronic kidney disease, stage 3 (moderate): Secondary | ICD-10-CM | POA: Diagnosis not present

## 2016-07-18 DIAGNOSIS — E1122 Type 2 diabetes mellitus with diabetic chronic kidney disease: Secondary | ICD-10-CM | POA: Diagnosis not present

## 2016-07-25 DIAGNOSIS — Z6841 Body Mass Index (BMI) 40.0 and over, adult: Secondary | ICD-10-CM | POA: Diagnosis not present

## 2016-07-25 DIAGNOSIS — I10 Essential (primary) hypertension: Secondary | ICD-10-CM | POA: Diagnosis not present

## 2016-08-01 DIAGNOSIS — I10 Essential (primary) hypertension: Secondary | ICD-10-CM | POA: Diagnosis not present

## 2016-08-01 DIAGNOSIS — E119 Type 2 diabetes mellitus without complications: Secondary | ICD-10-CM | POA: Diagnosis not present

## 2016-08-01 DIAGNOSIS — E559 Vitamin D deficiency, unspecified: Secondary | ICD-10-CM | POA: Diagnosis not present

## 2016-08-01 DIAGNOSIS — E782 Mixed hyperlipidemia: Secondary | ICD-10-CM | POA: Diagnosis not present

## 2016-08-01 DIAGNOSIS — R5383 Other fatigue: Secondary | ICD-10-CM | POA: Diagnosis not present

## 2016-08-16 DIAGNOSIS — M150 Primary generalized (osteo)arthritis: Secondary | ICD-10-CM | POA: Diagnosis not present

## 2016-08-16 DIAGNOSIS — M0589 Other rheumatoid arthritis with rheumatoid factor of multiple sites: Secondary | ICD-10-CM | POA: Diagnosis not present

## 2016-08-16 DIAGNOSIS — N183 Chronic kidney disease, stage 3 (moderate): Secondary | ICD-10-CM | POA: Diagnosis not present

## 2016-08-16 DIAGNOSIS — M67441 Ganglion, right hand: Secondary | ICD-10-CM | POA: Diagnosis not present

## 2016-08-16 DIAGNOSIS — M797 Fibromyalgia: Secondary | ICD-10-CM | POA: Diagnosis not present

## 2016-08-16 DIAGNOSIS — Z6841 Body Mass Index (BMI) 40.0 and over, adult: Secondary | ICD-10-CM | POA: Diagnosis not present

## 2016-08-16 DIAGNOSIS — M25561 Pain in right knee: Secondary | ICD-10-CM | POA: Diagnosis not present

## 2016-08-27 DIAGNOSIS — Z6841 Body Mass Index (BMI) 40.0 and over, adult: Secondary | ICD-10-CM | POA: Diagnosis not present

## 2016-08-27 DIAGNOSIS — E559 Vitamin D deficiency, unspecified: Secondary | ICD-10-CM | POA: Diagnosis not present

## 2016-08-27 DIAGNOSIS — I10 Essential (primary) hypertension: Secondary | ICD-10-CM | POA: Diagnosis not present

## 2016-08-27 DIAGNOSIS — E119 Type 2 diabetes mellitus without complications: Secondary | ICD-10-CM | POA: Diagnosis not present

## 2016-08-27 DIAGNOSIS — D649 Anemia, unspecified: Secondary | ICD-10-CM | POA: Diagnosis not present

## 2016-08-27 DIAGNOSIS — E782 Mixed hyperlipidemia: Secondary | ICD-10-CM | POA: Diagnosis not present

## 2016-09-13 ENCOUNTER — Emergency Department (HOSPITAL_COMMUNITY): Payer: Medicare Other

## 2016-09-13 ENCOUNTER — Emergency Department (HOSPITAL_COMMUNITY)
Admission: EM | Admit: 2016-09-13 | Discharge: 2016-09-13 | Disposition: A | Discharge status: Home / Self Care | Payer: Medicare Other | Attending: Emergency Medicine | Admitting: Emergency Medicine

## 2016-09-13 ENCOUNTER — Encounter (HOSPITAL_COMMUNITY): Payer: Self-pay

## 2016-09-13 DIAGNOSIS — Z79899 Other long term (current) drug therapy: Secondary | ICD-10-CM | POA: Insufficient documentation

## 2016-09-13 DIAGNOSIS — I10 Essential (primary) hypertension: Secondary | ICD-10-CM | POA: Insufficient documentation

## 2016-09-13 DIAGNOSIS — Z87891 Personal history of nicotine dependence: Secondary | ICD-10-CM | POA: Diagnosis not present

## 2016-09-13 DIAGNOSIS — I2510 Atherosclerotic heart disease of native coronary artery without angina pectoris: Secondary | ICD-10-CM | POA: Insufficient documentation

## 2016-09-13 DIAGNOSIS — M25572 Pain in left ankle and joints of left foot: Secondary | ICD-10-CM

## 2016-09-13 DIAGNOSIS — E119 Type 2 diabetes mellitus without complications: Secondary | ICD-10-CM | POA: Diagnosis not present

## 2016-09-13 DIAGNOSIS — S99912A Unspecified injury of left ankle, initial encounter: Secondary | ICD-10-CM | POA: Diagnosis not present

## 2016-09-13 MED ORDER — ACETAMINOPHEN 325 MG PO TABS: 650.0000 mg | ORAL_TABLET | Freq: Once | ORAL | Status: DC

## 2016-09-13 MED ORDER — OXYCODONE-ACETAMINOPHEN 5-325 MG PO TABS
2.0000 | ORAL_TABLET | Freq: Once | ORAL | Status: AC
  Administered 2016-09-13: 2 via ORAL
  Filled 2016-09-13: qty 2

## 2016-09-13 NOTE — ED Provider Notes (Signed)
Medical screening examination/treatment/procedure(s) were conducted as a shared visit with non-physician practitioner(s) and myself.  I personally evaluated the patient during the encounter.  Clinical Impression:   Final diagnoses:  Acute left ankle pain      71 year old female on Plavix for prior coronary disease status post bypass grafting who states that last night when she got up out of her chair she felt a catch or pain in her left ankle, she tried to walk back to the bedroom but by the time she got there she had such severe pain that she could not bear weight. She tried taking Vicodin with minimal improvement. This pain is persistent when she is ambulating, it is relieved when she has the foot up in the air, there is no associated swelling that she notes. Of note the patient does wear compression stockings.  Pain is constant, worse with ambulation, associated with mild swelling (not seen on exam), pain was acute in onset  On exam the patient does have significant decreased range of motion of the left ankle secondary to pain. The pain is mostly palpated distal to the tip of the lateral malleolus but there is also pain with all range of motion of the ankle. There is no tenderness to palpation over the medial malleolus or the bones of the foot. There is no obvious swelling redness or warmth to the joint. She has no history of gout, no history of recent surgery to suggest a septic arthritis, no history of trauma to suggest a hemarthrosis however I would consider that this could be a ligamentous injury, would also consider an effusion,  The patient was informed that she will get an x-ray, some pain medication for the pain, she is in agreement with the plan. Anticipate a walker and immobilization. She has been seen by Dr. Aline Brochure in the past for prior surgery, this would be a reasonable referral for outpatient follow-up if x-rays are negative.   Noemi Chapel, MD 09/14/16 [id number]

## 2016-09-13 NOTE — ED Provider Notes (Signed)
[facility] DEPT Provider Note   CSN: [id number] Arrival date & time: 09/13/16  2694     History   Chief Complaint Chief Complaint  Patient presents with  . Ankle Pain    HPI [name] is a 71 y.o. female.  Pt w PMHx fibromyalgia, RA, CAD s/p bypass graft, presents w acute onset L ankle pain that began last night when standing up from chair. Pt reports feeling sharp pain immediately after standing and could not bear weight. Denies injury to ankle or fall. Reports she has pain at rest and worse w movement. Denies N/T, calf pain, CP, SOB. No hx gout or DVT. Pt is taking plavix.       Past Medical History:  Diagnosis Date  . Anginal pain [facility])    sees Dr Einar Gip.   . Arthritis    rheumatoid ..   . Diabetes mellitus without complication (HCC)    Metformin 2.3.2017  . Diverticulitis   . Dysrhythmia   . Fibromyalgia   . GERD (gastroesophageal reflux disease)   . High cholesterol   . Hypertension   . Liver disease 08- 2012   per Dr Dagmar Hait pt has enlarged liver  . Peripheral vascular disease (HCC)    legs  . Pneumonia 06/17/2016  . Sleep apnea    sleep study  oct 2012  . Stroke [facility]) 2011   [facility]      Patient Active Problem List   Diagnosis Date Noted  . AKI (acute kidney injury) ([place]) 06/27/2016  . Rheumatoid arthritis ([place]) 06/27/2016  . Carpal tunnel syndrome 03/24/2014  . Paresthesias/numbness 03/24/2014  . Obesity, unspecified 09/23/2013  . Pulmonary nodules 07/03/2013  . TIA (transient ischemic attack) 06/20/2013  . Left arm weakness 06/20/2013  . DM (diabetes mellitus) ([place]) 06/20/2013  . HTN (hypertension) 06/20/2013  . Chronic pain 06/20/2013  . Vitreous hemorrhage ([place]) 03/25/2012    Past Surgical History:  Procedure Laterality Date  . [place]  . CHOLECYSTECTOMY  1996  . CORONARY ARTERY BYPASS GRAFT  1986  . EYE SURGERY  2013   cat ext bilateral .  . EYE SURGERY  2002   laser  . [place]- 2007   rod right leg - right wrist plate  . GAS/FLUID EXCHANGE  03/25/2012   Procedure: GAS/FLUID EXCHANGE;  Surgeon: Hayden Pedro, MD;  Location: [place];  Service: Ophthalmology;  Laterality: Right;  . PARS PLANA VITRECTOMY  03/25/2012   Procedure: PARS PLANA VITRECTOMY WITH 25 GAUGE;  Surgeon: Hayden Pedro, MD;  Location: [place];  Service: Ophthalmology;  Laterality: Right;    OB History    No data available       Home Medications    Prior to Admission medications   Medication Sig Start Date End Date Taking? Authorizing Provider  Blood Glucose Monitoring Suppl (FREESTYLE LITE) DEVI See admin instructions. 12/22/14  Yes Historical Provider, MD  clopidogrel (PLAVIX) 75 MG tablet Take 1 tablet (75 mg total) by mouth daily with breakfast. 12/31/14  Yes Garvin Fila, MD  diclofenac sodium (VOLTAREN) 1 % GEL Apply 2 g topically daily as needed (Pain).   Yes Historical Provider, MD  escitalopram (LEXAPRO) 10 MG tablet Take 10 mg by mouth daily.   Yes Historical Provider, MD  esomeprazole (NEXIUM) 40 MG capsule Take 40 mg by mouth daily.    Yes Historical Provider, MD  estrogen, conjugated,-medroxyprogesterone (PREMPRO) 0.625-5 MG per tablet Take 1 tablet by mouth  daily.   Yes Historical Provider, MD  HYDROcodone-acetaminophen (NORCO) 10-325 MG tablet Take 1 tablet by mouth 4 (four) times daily.   Yes Historical Provider, MD  medroxyPROGESTERone (PROVERA) 5 MG tablet Take 1 tablet (5 mg total) by mouth daily. 06/30/16  Yes Orvan Falconer, MD  pravastatin (PRAVACHOL) 40 MG tablet Take 40 mg by mouth at bedtime.   Yes Historical Provider, MD  senna-docusate (SENOKOT-S) 8.6-50 MG tablet Take 1 tablet by mouth at bedtime as needed for mild constipation. 06/29/16  Yes Orvan Falconer, MD  Tafluprost (ZIOPTAN) 0.0015 % SOLN Place 1 drop into both eyes at bedtime.    Yes Historical Provider, MD  temazepam (RESTORIL) 30 MG capsule Take 30 mg by mouth at bedtime.   Yes Historical Provider, MD  Tofacitinib  Citrate (XELJANZ) 5 MG TABS Take 5 mg by mouth 2 (two) times daily.    Historical Provider, MD    Family History Family History  Problem Relation Age of Onset  . Hypertension Daughter   . Rheum arthritis Maternal Grandmother     Social History Social History  Substance Use Topics  . Smoking status: Former Smoker    Packs/day: 0.25    Years: 5.00    Types: Cigarettes    Quit date: 06/11/1984  . Smokeless tobacco: Never Used  . Alcohol use 0.0 oz/week     Comment: occ     Allergies   Fentanyl; Cefdinir; Lactose intolerance (gi); Butrans [buprenorphine]; Penicillins; and Simvastatin   Review of Systems Review of Systems  Constitutional: Negative for chills and fever.  Respiratory: Negative for shortness of breath.   Cardiovascular: Negative for chest pain.  Musculoskeletal: Positive for arthralgias (L ankle pain).  Skin: Negative for color change and wound.  Neurological: Negative for numbness.  All other systems reviewed and are negative.    Physical Exam Updated Vital Signs BP (!) 145/75 (BP Location: Left Arm)   Pulse 99   Temp 98.2 F (36.8 C) (Oral)   Resp 19   Ht 5\' 6"  (1.676 m)   Wt 120.2 kg   SpO2 95%   BMI 42.77 kg/m   Physical Exam  Constitutional: She is oriented to person, place, and time. She appears well-developed and well-nourished. No distress.  HENT:  Head: Normocephalic and atraumatic.  Eyes: Conjunctivae are normal.  Neck: Normal range of motion.  Cardiovascular: Normal rate.   Pulmonary/Chest: Effort normal.  Musculoskeletal:  L ankle TTP just below lateral malleolus, dec ROM w dorsiflexion and plantar flexion 2/t pain w normal inversion/eversion of ankle. no ecchymosis, no gross deformities  Neurological: She is alert and oriented to person, place, and time.  Skin: Skin is warm and dry.  Psychiatric: She has a normal mood and affect. Her behavior is normal.     ED Treatments / Results  Labs (all labs ordered are listed, but only  abnormal results are displayed) Labs Reviewed - No data to display  EKG  EKG Interpretation None       Radiology Dg Ankle Complete Left  Result Date: 09/13/2016 CLINICAL DATA:  sitting in her chair watching TV last night and when she stood up to go to bed, it felt like her left ankle was kinked. --knot under her ankle. Limited ROM No injury EXAM: LEFT ANKLE COMPLETE - 3+ VIEW COMPARISON:  None. FINDINGS: There is no evidence for acute fracture or subluxation. There are plantar and calcaneal spurs. There is calcification in the Achilles tendon. Surgical clips identified along the medial aspect of the  lower leg and ankle. IMPRESSION: No evidence for acute  abnormality. Electronically Signed   By: Nolon Nations M.D.   On: 09/13/2016 08:14   Dg Foot Complete Left  Result Date: 09/13/2016 CLINICAL DATA:  Left ankle pain. EXAM: LEFT FOOT - COMPLETE 3+ VIEW COMPARISON:  10/13/2016 FINDINGS: Surgical clips identified along medial aspect of the ankle. There is no acute fracture or subluxation. Mild degenerative changes are identified at the inner phalangeal joints. Plantar and calcaneal spurs are present. There is calcification in the Achilles tendon. IMPRESSION: 1.  No evidence for acute  abnormality. 2. Calcification of the Achilles tendon. Electronically Signed   By: Nolon Nations M.D.   On: 09/13/2016 08:16    Procedures Procedures (including critical care time)  Medications Ordered in ED Medications  oxyCODONE-acetaminophen (PERCOCET/ROXICET) 5-325 MG per tablet 2 tablet (2 tablets Oral Given 09/13/16 0820)     Initial Impression / Assessment and Plan / ED Course  I have reviewed the triage vital signs and the nursing notes.  Pertinent labs & imaging results that were available during my care of the patient were reviewed by me and considered in my medical decision making (see chart for details).     Pt w PMHX RA, fibromyalgia, CAD s/p bypass graft, presents w acute onset L ankle  pain w/o assoc trauma. Xray ankle and foot wnl; NV intact. Pt afebrile, nontoxic. No hx gout, no recent surgeries; septic arthritis unlikely. Likely ligamentous or musculotendinous injury. Pt is unable to weight bear 2/t pain; will send w cam walking boot and walker. She can supplement PTA vicodin w advil for a few days for pain relief. Follow up w orthopedist, Dr. Aline Brochure in 3 days. Pt safe for d/c home.  Patient discussed with and seen by Dr. Sabra Heck. Discussed results, findings, treatment and follow up. Patient advised of return precautions. Patient verbalized understanding and agreed with plan.   Final Clinical Impressions(s) / ED Diagnoses   Final diagnoses:  Acute left ankle pain    New Prescriptions New Prescriptions   No medications on file     [place] N Russo, PA-C 09/13/16 [place], MD 09/14/16 [phone]

## 2016-09-13 NOTE — ED Triage Notes (Signed)
Patient states that she was sitting in her chair watching TV last night and when she stood up to go to bed, it felt like her left ankle was kinked.  Felt similar to a broken bone.  Patient states there is a knot under her ankle.  Denies any injury.

## 2016-09-13 NOTE — Discharge Instructions (Signed)
Please read instructions below. Apply ice to your ankle for 20 minutes at a time. Elevate your ankle, wear the walking boot when you are walking and only bear weight as tolerated. You can take advil (ibuprofen) in addition to your home prescription of Vicodin for the next few days. Follow up with Dr. Aline Brochure. Return to ER if you develop a fever or redness around the ankle.

## 2016-09-13 NOTE — ED Notes (Signed)
Pt made aware to return if symptoms worsen or if any life threatening symptoms occur.   

## 2016-09-17 ENCOUNTER — Ambulatory Visit (INDEPENDENT_AMBULATORY_CARE_PROVIDER_SITE_OTHER): Payer: Medicare Other | Admitting: Orthopedic Surgery

## 2016-09-17 VITALS — BP 141/83 | HR 91 | Ht 66.0 in | Wt 265.0 lb

## 2016-09-17 DIAGNOSIS — M7672 Peroneal tendinitis, left leg: Secondary | ICD-10-CM

## 2016-09-17 NOTE — Progress Notes (Signed)
Patient ID: [name], female   DOB: [birth year], 71 y.o.   MRN: [id number]  Chief Complaint  Patient presents with  . Ankle Injury    ER follow up on left ankle, no injury, pain started on 09-12-16.    HPI [name] is a 71 y.o. female.  Presents for evaluation of lateral ankle and foot pain  The patient reports atraumatic onset of pain on the lateral aspect of her left foot. She denies trauma. She says she sat down for a while got up and then couldn't put weight on her foot. Acute onset of lateral sharp slightly burning type pain over the lateral portion of the foot associated with inability to weight-bear and swelling.  She went to the ER x-rays were negative for fracture she was placed in a Cam Walker placed in a walking device  Review of Systems Review of Systems  Constitutional: Negative for fever.  Musculoskeletal: Positive for gait problem, joint swelling and myalgias.  Skin: Negative for color change.  Neurological: Negative for weakness.   (2 MINIMUM)  Past Medical History:  Diagnosis Date  . Anginal pain [facility])    sees Dr Einar Gip.   . Arthritis    rheumatoid ..   . Diabetes mellitus without complication (HCC)    Metformin 2.3.2017  . Diverticulitis   . Dysrhythmia   . Fibromyalgia   . GERD (gastroesophageal reflux disease)   . High cholesterol   . Hypertension   . Liver disease 08- 2012   per Dr Dagmar Hait pt has enlarged liver  . Peripheral vascular disease (HCC)    legs  . Pneumonia 06/17/2016  . Sleep apnea    sleep study  oct 2012  . Stroke [facility]) 2011   [facility]      Past Surgical History:  Procedure Laterality Date  . [place]  . CHOLECYSTECTOMY  1996  . CORONARY ARTERY BYPASS GRAFT  1986  . EYE SURGERY  2013   cat ext bilateral .  . EYE SURGERY  2002   laser  . [place]- 2007   rod right leg - right wrist plate  . GAS/FLUID EXCHANGE  03/25/2012   Procedure: GAS/FLUID EXCHANGE;  Surgeon: Hayden Pedro, MD;  Location: [place];  Service: Ophthalmology;  Laterality: Right;  . PARS PLANA VITRECTOMY  03/25/2012   Procedure: PARS PLANA VITRECTOMY WITH 25 GAUGE;  Surgeon: Hayden Pedro, MD;  Location: [place];  Service: Ophthalmology;  Laterality: Right;    Social History Social History  Substance Use Topics  . Smoking status: Former Smoker    Packs/day: 0.25    Years: 5.00    Types: Cigarettes    Quit date: 06/11/1984  . Smokeless tobacco: Never Used  . Alcohol use 0.0 oz/week     Comment: occ    Allergies  Allergen Reactions  . Fentanyl Anaphylaxis and Shortness Of Breath  . Cefdinir     Patient isn't sure if she is allergic to this medication.  . Lactose Intolerance (Gi) Other (See Comments)    G.I. Upset  . Butrans [Buprenorphine] Rash and Other (See Comments)    Infected skin underneath application  . Penicillins Rash    Facial rash Has patient had a PCN reaction causing immediate rash, facial/tongue/throat swelling, SOB or lightheadedness with hypotension: Yes Has patient had a PCN reaction causing severe rash involving mucus membranes or skin necrosis: No Has patient had a PCN reaction that required hospitalization  No Has patient had a PCN reaction occurring within the last 10 years: Yes If all of the above answers are "NO", then may proceed with Cephalosporin use.   . Simvastatin Rash    No outpatient prescriptions have been marked as taking for the 09/17/16 encounter (Office Visit) with Carole Civil, MD.      Physical Exam Physical Exam 1.BP (!) 141/83   Pulse 91   Ht 5\' 6"  (1.676 m)   Wt 265 lb (120.2 kg)   BMI 42.77 kg/m   2. Gen. appearance. The patient is well-developed and well-nourished, grooming and hygiene are normal. There are no gross congenital abnormalities  3. The patient is alert and oriented to person place and time  4. Mood and affect are normal  5. Ambulation She walks with limited weightbearing favoring the left foot she is  using a walker  On inspection we find tenderness in the peroneal tendons with slight swelling. Most of the tenderness and pain are distal to the retinaculum. Ankle range of motion is painful and limited by pain the ankle is stable she has weakness of her E for orders painful inversion of the foot the skin is without rash ulceration or erythema sensation remains intact she has a good pulse and normal capillary refill Examination reveals the following: MEDICAL DECISION MAKING:    Data Reviewed X-rays were done at the hospital  There is no fracture seen there some degenerative changes in the foot  Assessment Peroneal tendon tendinitis versus partial rupture  Plan Immobilization for 6 weeks come back reexamine if no improvement may need to perform debridement and/or tenodesis  Arther Abbott 09/17/2016, 12:37 PM

## 2016-09-17 NOTE — Patient Instructions (Signed)
PERONEAL TENDON TEAR

## 2016-09-25 DIAGNOSIS — H401132 Primary open-angle glaucoma, bilateral, moderate stage: Secondary | ICD-10-CM | POA: Diagnosis not present

## 2016-09-25 DIAGNOSIS — H04123 Dry eye syndrome of bilateral lacrimal glands: Secondary | ICD-10-CM | POA: Diagnosis not present

## 2016-09-25 DIAGNOSIS — H524 Presbyopia: Secondary | ICD-10-CM | POA: Diagnosis not present

## 2016-09-25 DIAGNOSIS — H5213 Myopia, bilateral: Secondary | ICD-10-CM | POA: Diagnosis not present

## 2016-09-25 DIAGNOSIS — H348112 Central retinal vein occlusion, right eye, stable: Secondary | ICD-10-CM | POA: Diagnosis not present

## 2016-09-25 DIAGNOSIS — H52223 Regular astigmatism, bilateral: Secondary | ICD-10-CM | POA: Diagnosis not present

## 2016-09-25 DIAGNOSIS — E1165 Type 2 diabetes mellitus with hyperglycemia: Secondary | ICD-10-CM | POA: Diagnosis not present

## 2016-09-27 DIAGNOSIS — D649 Anemia, unspecified: Secondary | ICD-10-CM | POA: Diagnosis not present

## 2016-10-03 DIAGNOSIS — R05 Cough: Secondary | ICD-10-CM | POA: Diagnosis not present

## 2016-10-03 DIAGNOSIS — J060 Acute laryngopharyngitis: Secondary | ICD-10-CM | POA: Diagnosis not present

## 2016-10-29 ENCOUNTER — Ambulatory Visit (INDEPENDENT_AMBULATORY_CARE_PROVIDER_SITE_OTHER): Payer: Medicare Other | Admitting: Orthopedic Surgery

## 2016-10-29 ENCOUNTER — Encounter: Payer: Self-pay | Admitting: Orthopedic Surgery

## 2016-10-29 DIAGNOSIS — M7672 Peroneal tendinitis, left leg: Secondary | ICD-10-CM

## 2016-10-29 NOTE — Progress Notes (Signed)
Follow-up   Chief Complaint  Patient presents with  . Follow-up    LEFT FOOT/ANKLE    The patient peroneal tendinitis Saturday when walking which Achilles tendon. She did apply Voltaren gel is feeling better but still symptomatic   Review of Systems  Neurological: Negative for tingling.   Physical Exam  Constitutional: She is oriented to person, place, and time. She appears well-developed and well-nourished.  Neurological: She is alert and oriented to person, place, and time.  Psychiatric: She has a normal mood and affect.   The patient peroneal tendon is nontender she has normal inversion and eversion including strength and range of motion in the ankle is stable she is neurovascularly intact but she is having tenderness over the Achilles. She is ambulatory with the boot with appropriate gait  Recommend 2 weeks of ice, Voltaren gel and Cam Walker  Call us if no improvement in the Achilles  Encounter Diagnosis  Name Primary?  . Peroneus brevis tendinitis, left Yes   Achilles tendonitis

## 2016-11-20 DIAGNOSIS — N183 Chronic kidney disease, stage 3 (moderate): Secondary | ICD-10-CM | POA: Diagnosis not present

## 2016-11-20 DIAGNOSIS — M67441 Ganglion, right hand: Secondary | ICD-10-CM | POA: Diagnosis not present

## 2016-11-20 DIAGNOSIS — M797 Fibromyalgia: Secondary | ICD-10-CM | POA: Diagnosis not present

## 2016-11-20 DIAGNOSIS — M150 Primary generalized (osteo)arthritis: Secondary | ICD-10-CM | POA: Diagnosis not present

## 2016-11-20 DIAGNOSIS — M0589 Other rheumatoid arthritis with rheumatoid factor of multiple sites: Secondary | ICD-10-CM | POA: Diagnosis not present

## 2016-11-20 DIAGNOSIS — L989 Disorder of the skin and subcutaneous tissue, unspecified: Secondary | ICD-10-CM | POA: Diagnosis not present

## 2016-11-20 DIAGNOSIS — M25561 Pain in right knee: Secondary | ICD-10-CM | POA: Diagnosis not present

## 2016-11-20 DIAGNOSIS — Z6841 Body Mass Index (BMI) 40.0 and over, adult: Secondary | ICD-10-CM | POA: Diagnosis not present

## 2016-12-03 DIAGNOSIS — N390 Urinary tract infection, site not specified: Secondary | ICD-10-CM | POA: Diagnosis not present

## 2016-12-10 DIAGNOSIS — L821 Other seborrheic keratosis: Secondary | ICD-10-CM | POA: Diagnosis not present

## 2016-12-10 DIAGNOSIS — X32XXXA Exposure to sunlight, initial encounter: Secondary | ICD-10-CM | POA: Diagnosis not present

## 2016-12-10 DIAGNOSIS — L570 Actinic keratosis: Secondary | ICD-10-CM | POA: Diagnosis not present

## 2016-12-27 DIAGNOSIS — E119 Type 2 diabetes mellitus without complications: Secondary | ICD-10-CM | POA: Diagnosis not present

## 2016-12-27 DIAGNOSIS — E559 Vitamin D deficiency, unspecified: Secondary | ICD-10-CM | POA: Diagnosis not present

## 2016-12-27 DIAGNOSIS — I10 Essential (primary) hypertension: Secondary | ICD-10-CM | POA: Diagnosis not present

## 2017-01-09 DIAGNOSIS — I10 Essential (primary) hypertension: Secondary | ICD-10-CM | POA: Diagnosis not present

## 2017-01-09 DIAGNOSIS — Z6841 Body Mass Index (BMI) 40.0 and over, adult: Secondary | ICD-10-CM | POA: Diagnosis not present

## 2017-01-09 DIAGNOSIS — D509 Iron deficiency anemia, unspecified: Secondary | ICD-10-CM | POA: Diagnosis not present

## 2017-01-09 DIAGNOSIS — E782 Mixed hyperlipidemia: Secondary | ICD-10-CM | POA: Diagnosis not present

## 2017-01-09 DIAGNOSIS — E559 Vitamin D deficiency, unspecified: Secondary | ICD-10-CM | POA: Diagnosis not present

## 2017-01-09 DIAGNOSIS — E119 Type 2 diabetes mellitus without complications: Secondary | ICD-10-CM | POA: Diagnosis not present

## 2017-01-28 DIAGNOSIS — H6121 Impacted cerumen, right ear: Secondary | ICD-10-CM | POA: Diagnosis not present

## 2017-01-28 DIAGNOSIS — Z6841 Body Mass Index (BMI) 40.0 and over, adult: Secondary | ICD-10-CM | POA: Diagnosis not present

## 2017-01-28 DIAGNOSIS — H9190 Unspecified hearing loss, unspecified ear: Secondary | ICD-10-CM | POA: Diagnosis not present

## 2017-02-18 DIAGNOSIS — F410 Panic disorder [episodic paroxysmal anxiety] without agoraphobia: Secondary | ICD-10-CM | POA: Diagnosis not present

## 2017-02-18 DIAGNOSIS — F411 Generalized anxiety disorder: Secondary | ICD-10-CM | POA: Diagnosis not present

## 2017-02-18 DIAGNOSIS — Z6841 Body Mass Index (BMI) 40.0 and over, adult: Secondary | ICD-10-CM | POA: Diagnosis not present

## 2017-02-18 DIAGNOSIS — R197 Diarrhea, unspecified: Secondary | ICD-10-CM | POA: Diagnosis not present

## 2017-02-20 DIAGNOSIS — M67441 Ganglion, right hand: Secondary | ICD-10-CM | POA: Diagnosis not present

## 2017-02-20 DIAGNOSIS — N183 Chronic kidney disease, stage 3 (moderate): Secondary | ICD-10-CM | POA: Diagnosis not present

## 2017-02-20 DIAGNOSIS — M25561 Pain in right knee: Secondary | ICD-10-CM | POA: Diagnosis not present

## 2017-02-20 DIAGNOSIS — L989 Disorder of the skin and subcutaneous tissue, unspecified: Secondary | ICD-10-CM | POA: Diagnosis not present

## 2017-02-20 DIAGNOSIS — M797 Fibromyalgia: Secondary | ICD-10-CM | POA: Diagnosis not present

## 2017-02-20 DIAGNOSIS — Z6841 Body Mass Index (BMI) 40.0 and over, adult: Secondary | ICD-10-CM | POA: Diagnosis not present

## 2017-02-20 DIAGNOSIS — M150 Primary generalized (osteo)arthritis: Secondary | ICD-10-CM | POA: Diagnosis not present

## 2017-02-20 DIAGNOSIS — M0589 Other rheumatoid arthritis with rheumatoid factor of multiple sites: Secondary | ICD-10-CM | POA: Diagnosis not present

## 2017-03-20 DIAGNOSIS — F410 Panic disorder [episodic paroxysmal anxiety] without agoraphobia: Secondary | ICD-10-CM | POA: Diagnosis not present

## 2017-03-20 DIAGNOSIS — Z23 Encounter for immunization: Secondary | ICD-10-CM | POA: Diagnosis not present

## 2017-03-20 DIAGNOSIS — R197 Diarrhea, unspecified: Secondary | ICD-10-CM | POA: Diagnosis not present

## 2017-03-20 DIAGNOSIS — Z6841 Body Mass Index (BMI) 40.0 and over, adult: Secondary | ICD-10-CM | POA: Diagnosis not present

## 2017-03-20 DIAGNOSIS — F419 Anxiety disorder, unspecified: Secondary | ICD-10-CM | POA: Diagnosis not present

## 2017-03-27 DIAGNOSIS — H401132 Primary open-angle glaucoma, bilateral, moderate stage: Secondary | ICD-10-CM | POA: Diagnosis not present

## 2017-03-27 DIAGNOSIS — H348112 Central retinal vein occlusion, right eye, stable: Secondary | ICD-10-CM | POA: Diagnosis not present

## 2017-03-27 DIAGNOSIS — H04123 Dry eye syndrome of bilateral lacrimal glands: Secondary | ICD-10-CM | POA: Diagnosis not present

## 2017-03-27 DIAGNOSIS — E1165 Type 2 diabetes mellitus with hyperglycemia: Secondary | ICD-10-CM | POA: Diagnosis not present

## 2017-05-13 DIAGNOSIS — I10 Essential (primary) hypertension: Secondary | ICD-10-CM | POA: Diagnosis not present

## 2017-05-13 DIAGNOSIS — E559 Vitamin D deficiency, unspecified: Secondary | ICD-10-CM | POA: Diagnosis not present

## 2017-05-13 DIAGNOSIS — E119 Type 2 diabetes mellitus without complications: Secondary | ICD-10-CM | POA: Diagnosis not present

## 2017-05-13 DIAGNOSIS — E782 Mixed hyperlipidemia: Secondary | ICD-10-CM | POA: Diagnosis not present

## 2017-05-15 DIAGNOSIS — Z0001 Encounter for general adult medical examination with abnormal findings: Secondary | ICD-10-CM | POA: Diagnosis not present

## 2017-05-15 DIAGNOSIS — I10 Essential (primary) hypertension: Secondary | ICD-10-CM | POA: Diagnosis not present

## 2017-05-15 DIAGNOSIS — E782 Mixed hyperlipidemia: Secondary | ICD-10-CM | POA: Diagnosis not present

## 2017-05-15 DIAGNOSIS — E559 Vitamin D deficiency, unspecified: Secondary | ICD-10-CM | POA: Diagnosis not present

## 2017-05-15 DIAGNOSIS — D509 Iron deficiency anemia, unspecified: Secondary | ICD-10-CM | POA: Diagnosis not present

## 2017-05-15 DIAGNOSIS — Z1389 Encounter for screening for other disorder: Secondary | ICD-10-CM | POA: Diagnosis not present

## 2017-05-15 DIAGNOSIS — Z8673 Personal history of transient ischemic attack (TIA), and cerebral infarction without residual deficits: Secondary | ICD-10-CM | POA: Diagnosis not present

## 2017-05-15 DIAGNOSIS — E119 Type 2 diabetes mellitus without complications: Secondary | ICD-10-CM | POA: Diagnosis not present

## 2017-05-15 DIAGNOSIS — R944 Abnormal results of kidney function studies: Secondary | ICD-10-CM | POA: Diagnosis not present

## 2017-05-15 DIAGNOSIS — M797 Fibromyalgia: Secondary | ICD-10-CM | POA: Diagnosis not present

## 2017-06-26 ENCOUNTER — Ambulatory Visit (INDEPENDENT_AMBULATORY_CARE_PROVIDER_SITE_OTHER): Payer: Medicare Other | Admitting: Orthopedic Surgery

## 2017-06-26 ENCOUNTER — Encounter: Payer: Self-pay | Admitting: Orthopedic Surgery

## 2017-06-26 ENCOUNTER — Ambulatory Visit (INDEPENDENT_AMBULATORY_CARE_PROVIDER_SITE_OTHER): Payer: Medicare Other

## 2017-06-26 VITALS — BP 121/69 | HR 85 | Ht 66.0 in | Wt 275.0 lb

## 2017-06-26 DIAGNOSIS — M84375A Stress fracture, left foot, initial encounter for fracture: Secondary | ICD-10-CM

## 2017-06-26 DIAGNOSIS — M79672 Pain in left foot: Secondary | ICD-10-CM

## 2017-06-26 NOTE — Progress Notes (Signed)
Progress Note   Patient ID: [name], female   DOB: 1945/07/25, 72 y.o.   MRN: [id number]  Chief Complaint  Patient presents with  . Follow-up    Left achilles    72 year old female with a history of peroneal tendinitis and Achilles tendinitis presents with increasing pain in the left foot for 1 week.  She denies any trauma.  Initially she said it was the tendon again but her pain is in a different place.  She complains of pain at the proximal aspect of the metatarsal which radiates distally involving the left small toe and the fourth digit.  She has tried various methods to get relief including wearing a Cam walker, walking on her heel, ibuprofen which gave her some relief from the throbbing, Voltaren gel, Ace bandage, ice and rest.  SHE C/O DULL THROBBING SEVERE ACHING PAIN X 1 WEEK IN THE LEFT FOOT      Review of Systems  Constitutional: Negative for chills and fever.  Skin: Negative.   Neurological: Negative for tingling, tremors and sensory change.   Current Meds  Medication Sig  . Blood Glucose Monitoring Suppl (FREESTYLE LITE) DEVI See admin instructions.  . clopidogrel (PLAVIX) 75 MG tablet Take 1 tablet (75 mg total) by mouth daily with breakfast.  . diclofenac sodium (VOLTAREN) 1 % GEL Apply 2 g topically daily as needed (Pain).  Marland Kitchen escitalopram (LEXAPRO) 10 MG tablet Take 10 mg by mouth daily.  Marland Kitchen esomeprazole (NEXIUM) 40 MG capsule Take 40 mg by mouth daily.   Marland Kitchen HYDROcodone-acetaminophen (NORCO) 10-325 MG tablet Take 1 tablet by mouth 4 (four) times daily.  . Olmesartan-Amlodipine-HCTZ (TRIBENZOR) 40-5-25 MG TABS Take by mouth.  . pravastatin (PRAVACHOL) 40 MG tablet Take 40 mg by mouth at bedtime.  . senna-docusate (SENOKOT-S) 8.6-50 MG tablet Take 1 tablet by mouth at bedtime as needed for mild constipation.  . Tafluprost (ZIOPTAN) 0.0015 % SOLN Place 1 drop into both eyes at bedtime.   . temazepam (RESTORIL) 30 MG capsule Take 30 mg by mouth at bedtime.  .  Tofacitinib Citrate (XELJANZ) 5 MG TABS Take 5 mg by mouth 2 (two) times daily.    Past Medical History:  Diagnosis Date  . Anginal pain [facility])    sees Dr Einar Gip.   . Arthritis    rheumatoid ..   . Diabetes mellitus without complication (HCC)    Metformin 2.3.2017  . Diverticulitis   . Dysrhythmia   . Fibromyalgia   . GERD (gastroesophageal reflux disease)   . High cholesterol   . Hypertension   . Liver disease 08- 2012   per Dr Dagmar Hait pt has enlarged liver  . Peripheral vascular disease (HCC)    legs  . Pneumonia 06/17/2016  . Sleep apnea    sleep study  oct 2012  . Stroke [facility]) 2011   [place]       Allergies  Allergen Reactions  . Fentanyl Anaphylaxis and Shortness Of Breath  . Cefdinir     Patient isn't sure if she is allergic to this medication.  . Lactose Intolerance (Gi) Other (See Comments)    G.I. Upset  . Butrans [Buprenorphine] Rash and Other (See Comments)    Infected skin underneath application  . Penicillins Rash    Facial rash Has patient had a PCN reaction causing immediate rash, facial/tongue/throat swelling, SOB or lightheadedness with hypotension: Yes Has patient had a PCN reaction causing severe rash involving mucus membranes or skin necrosis: No Has patient had a  PCN reaction that required hospitalization No Has patient had a PCN reaction occurring within the last 10 years: Yes If all of the above answers are "NO", then may proceed with Cephalosporin use.   . Simvastatin Rash    BP 121/69   Pulse 85   Ht 5\' 6"  (1.676 m)   Wt 275 lb (124.7 kg)   BMI 44.39 kg/m    Physical Exam  Constitutional: She is oriented to person, place, and time. She appears well-developed and well-nourished.  Musculoskeletal:       Feet:  Neurological: She is alert and oriented to person, place, and time. Gait abnormal.  Psychiatric: She has a normal mood and affect. Judgment normal.  Vitals reviewed.   Ortho Exam   Medical  decision-making  Imaging: X-ray was obtained left foot no obvious fracture please see my dictated report    Encounter Diagnoses  Name Primary?  . Left foot pain Yes  . Stress fracture, left foot, initial encounter for fracture      Current Outpatient Medications:  .  Blood Glucose Monitoring Suppl (FREESTYLE LITE) DEVI, See admin instructions., Disp: , Rfl: 0 .  clopidogrel (PLAVIX) 75 MG tablet, Take 1 tablet (75 mg total) by mouth daily with breakfast., Disp: 90 tablet, Rfl: 6 .  diclofenac sodium (VOLTAREN) 1 % GEL, Apply 2 g topically daily as needed (Pain)., Disp: , Rfl:  .  escitalopram (LEXAPRO) 10 MG tablet, Take 10 mg by mouth daily., Disp: , Rfl:  .  esomeprazole (NEXIUM) 40 MG capsule, Take 40 mg by mouth daily. , Disp: , Rfl:  .  HYDROcodone-acetaminophen (NORCO) 10-325 MG tablet, Take 1 tablet by mouth 4 (four) times daily., Disp: , Rfl:  .  Olmesartan-Amlodipine-HCTZ (TRIBENZOR) 40-5-25 MG TABS, Take by mouth., Disp: , Rfl:  .  pravastatin (PRAVACHOL) 40 MG tablet, Take 40 mg by mouth at bedtime., Disp: , Rfl:  .  senna-docusate (SENOKOT-S) 8.6-50 MG tablet, Take 1 tablet by mouth at bedtime as needed for mild constipation., Disp: 60 tablet, Rfl: 1 .  Tafluprost (ZIOPTAN) 0.0015 % SOLN, Place 1 drop into both eyes at bedtime. , Disp: , Rfl:  .  temazepam (RESTORIL) 30 MG capsule, Take 30 mg by mouth at bedtime., Disp: , Rfl:  .  Tofacitinib Citrate (XELJANZ) 5 MG TABS, Take 5 mg by mouth 2 (two) times daily., Disp: , Rfl:   X-RAYS LEFT FOOT: SEE DICTATED REPORT   MRI ordered to rule out a stress fracture  Arther Abbott, MD 06/26/2017 10:27 AM

## 2017-07-04 ENCOUNTER — Ambulatory Visit (HOSPITAL_COMMUNITY)
Admission: RE | Admit: 2017-07-04 | Discharge: 2017-07-04 | Disposition: A | Discharge status: Home / Self Care | Payer: Medicare Other | Source: Ambulatory Visit | Attending: Orthopedic Surgery | Admitting: Orthopedic Surgery

## 2017-07-04 DIAGNOSIS — M25475 Effusion, left foot: Secondary | ICD-10-CM | POA: Diagnosis not present

## 2017-07-04 DIAGNOSIS — M79672 Pain in left foot: Secondary | ICD-10-CM | POA: Insufficient documentation

## 2017-07-04 DIAGNOSIS — M25476 Effusion, unspecified foot: Secondary | ICD-10-CM | POA: Diagnosis not present

## 2017-07-08 ENCOUNTER — Ambulatory Visit (INDEPENDENT_AMBULATORY_CARE_PROVIDER_SITE_OTHER): Payer: Medicare Other | Admitting: Orthopedic Surgery

## 2017-07-08 VITALS — BP 126/78 | HR 73 | Ht 66.0 in | Wt 275.0 lb

## 2017-07-08 DIAGNOSIS — M1990 Unspecified osteoarthritis, unspecified site: Secondary | ICD-10-CM

## 2017-07-08 MED ORDER — PREDNISONE 10 MG (48) PO TBPK: ORAL_TABLET | Freq: Every day | ORAL | 1 refills | Status: DC

## 2017-07-08 NOTE — Patient Instructions (Signed)
Short course prednisone  Boot when out  Rest  Topical voltaren

## 2017-07-08 NOTE — Progress Notes (Signed)
Progress Note   Patient ID: [name], female   DOB: 1945/11/04, 72 y.o.   MRN: [id number]  Chief Complaint  Patient presents with  . Follow-up    MRI results of left foot    72 year old female comes in for repeat evaluation of her foot with left foot and pain inflammation trouble weightbearing she reports some mild improvement but still has persistent pain and inability to bear weight and we are going to read her MRI     Review of Systems  Constitutional: Negative for fever.  Neurological: Negative for tingling.   No outpatient medications have been marked as taking for the 07/08/17 encounter (Office Visit) with Carole Civil, MD.    Allergies  Allergen Reactions  . Fentanyl Anaphylaxis and Shortness Of Breath  . Cefdinir     Patient isn't sure if she is allergic to this medication.  . Lactose Intolerance (Gi) Other (See Comments)    G.I. Upset  . Butrans [Buprenorphine] Rash and Other (See Comments)    Infected skin underneath application  . Penicillins Rash    Facial rash Has patient had a PCN reaction causing immediate rash, facial/tongue/throat swelling, SOB or lightheadedness with hypotension: Yes Has patient had a PCN reaction causing severe rash involving mucus membranes or skin necrosis: No Has patient had a PCN reaction that required hospitalization No Has patient had a PCN reaction occurring within the last 10 years: Yes If all of the above answers are "NO", then may proceed with Cephalosporin use.   . Simvastatin Rash     BP 126/78   Pulse 73   Ht 5\' 6"  (1.676 m)   Wt 275 lb (124.7 kg)   BMI 44.39 kg/m   Physical Exam  Constitutional: She is oriented to person, place, and time. She appears well-developed and well-nourished.  Musculoskeletal:  Decreased swelling in the left foot.  Still has some tenderness at the MTP joint especially on the lateral side of the foot skin remains intact no sensory deficits good pulse and perfusion  Neurological:  She is alert and oriented to person, place, and time.  Psychiatric: She has a normal mood and affect. Judgment normal.  Vitals reviewed.    Medical decision-making Encounter Diagnosis  Name Primary?  . Inflammatory arthritis Yes   MRI.  I read as inflammation of the small joints of the foot.  No fracture no stress fracture.  Recommend steroid Dosepak for 12 days continue rest topical anti-inflammatories  Follow-up in 2 months  Meds ordered this encounter  Medications  . predniSONE (STERAPRED UNI-PAK 48 TAB) 10 MG (48) TBPK tablet    Sig: Take by mouth daily. 10 mg 12-day double strength Dosepak as directed    Dispense:  48 tablet    Refill:  1     Arther Abbott, MD 07/08/2017 11:11 AM

## 2017-07-11 DIAGNOSIS — L989 Disorder of the skin and subcutaneous tissue, unspecified: Secondary | ICD-10-CM | POA: Diagnosis not present

## 2017-07-11 DIAGNOSIS — M0589 Other rheumatoid arthritis with rheumatoid factor of multiple sites: Secondary | ICD-10-CM | POA: Diagnosis not present

## 2017-07-11 DIAGNOSIS — M150 Primary generalized (osteo)arthritis: Secondary | ICD-10-CM | POA: Diagnosis not present

## 2017-07-11 DIAGNOSIS — M67441 Ganglion, right hand: Secondary | ICD-10-CM | POA: Diagnosis not present

## 2017-07-11 DIAGNOSIS — N183 Chronic kidney disease, stage 3 (moderate): Secondary | ICD-10-CM | POA: Diagnosis not present

## 2017-07-11 DIAGNOSIS — Z6841 Body Mass Index (BMI) 40.0 and over, adult: Secondary | ICD-10-CM | POA: Diagnosis not present

## 2017-07-11 DIAGNOSIS — M79672 Pain in left foot: Secondary | ICD-10-CM | POA: Diagnosis not present

## 2017-07-11 DIAGNOSIS — M797 Fibromyalgia: Secondary | ICD-10-CM | POA: Diagnosis not present

## 2017-07-11 DIAGNOSIS — M25561 Pain in right knee: Secondary | ICD-10-CM | POA: Diagnosis not present

## 2017-07-17 DIAGNOSIS — E1122 Type 2 diabetes mellitus with diabetic chronic kidney disease: Secondary | ICD-10-CM | POA: Diagnosis not present

## 2017-07-17 DIAGNOSIS — N183 Chronic kidney disease, stage 3 (moderate): Secondary | ICD-10-CM | POA: Diagnosis not present

## 2017-07-17 DIAGNOSIS — I2510 Atherosclerotic heart disease of native coronary artery without angina pectoris: Secondary | ICD-10-CM | POA: Diagnosis not present

## 2017-07-17 DIAGNOSIS — I493 Ventricular premature depolarization: Secondary | ICD-10-CM | POA: Diagnosis not present

## 2017-09-02 DIAGNOSIS — R350 Frequency of micturition: Secondary | ICD-10-CM | POA: Diagnosis not present

## 2017-09-02 DIAGNOSIS — R944 Abnormal results of kidney function studies: Secondary | ICD-10-CM | POA: Diagnosis not present

## 2017-09-02 DIAGNOSIS — M545 Low back pain: Secondary | ICD-10-CM | POA: Diagnosis not present

## 2017-09-03 ENCOUNTER — Other Ambulatory Visit: Payer: Self-pay | Admitting: Internal Medicine

## 2017-09-03 DIAGNOSIS — M546 Pain in thoracic spine: Secondary | ICD-10-CM

## 2017-09-04 ENCOUNTER — Encounter: Payer: Self-pay | Admitting: Orthopedic Surgery

## 2017-09-04 ENCOUNTER — Ambulatory Visit (INDEPENDENT_AMBULATORY_CARE_PROVIDER_SITE_OTHER): Payer: Medicare Other | Admitting: Orthopedic Surgery

## 2017-09-04 VITALS — BP 145/85 | HR 94 | Ht 66.0 in | Wt 270.0 lb

## 2017-09-04 DIAGNOSIS — M1990 Unspecified osteoarthritis, unspecified site: Secondary | ICD-10-CM | POA: Diagnosis not present

## 2017-09-04 DIAGNOSIS — M79672 Pain in left foot: Secondary | ICD-10-CM

## 2017-09-04 NOTE — Addendum Note (Signed)
Addended byCandice Camp on: 09/04/2017 11:35 AM   Modules accepted: Orders

## 2017-09-04 NOTE — Patient Instructions (Signed)
[employer]: CALL TO SCHEDULE APPT [phone] [id number]

## 2017-09-04 NOTE — Progress Notes (Signed)
Progress Note   Patient ID: [name], female   DOB: 01-Oct-1945, 72 y.o.   MRN: [id number]  Chief Complaint  Patient presents with  . Foot Pain    left / feels better has used 2 prednisone tapers now on pred 5mg / day from Rheumatologist    72 year old female with appointment today to reassess her left foot.  She has had various bouts of rheumatoid arthritis had an MRI showed joint effusions she is now on a 5 mg/day prednisone dose after 2 rounds of prednisone taper which seems to control pain and swelling of the left foot she did have some right foot metatarsalgia last week which seems to resolve on its own    Review of Systems  Musculoskeletal: Positive for joint pain.   Current Meds  Medication Sig  . Blood Glucose Monitoring Suppl (FREESTYLE LITE) DEVI See admin instructions.  . clopidogrel (PLAVIX) 75 MG tablet Take 1 tablet (75 mg total) by mouth daily with breakfast.  . diclofenac sodium (VOLTAREN) 1 % GEL Apply 2 g topically daily as needed (Pain).  Marland Kitchen esomeprazole (NEXIUM) 40 MG capsule Take 40 mg by mouth daily.   Marland Kitchen HYDROcodone-acetaminophen (NORCO) 10-325 MG tablet Take 1 tablet by mouth 4 (four) times daily.  . Olmesartan-Amlodipine-HCTZ (TRIBENZOR) 40-5-25 MG TABS Take by mouth.  . pravastatin (PRAVACHOL) 40 MG tablet Take 40 mg by mouth at bedtime.  . senna-docusate (SENOKOT-S) 8.6-50 MG tablet Take 1 tablet by mouth at bedtime as needed for mild constipation.  . Tafluprost (ZIOPTAN) 0.0015 % SOLN Place 1 drop into both eyes at bedtime.   . temazepam (RESTORIL) 30 MG capsule Take 30 mg by mouth at bedtime.  . Tofacitinib Citrate (XELJANZ) 5 MG TABS Take 5 mg by mouth 2 (two) times daily.    Allergies  Allergen Reactions  . Fentanyl Anaphylaxis and Shortness Of Breath  . Cefdinir     Patient isn't sure if she is allergic to this medication.  . Lactose Intolerance (Gi) Other (See Comments)    G.I. Upset  . Butrans [Buprenorphine] Rash and Other (See Comments)   Infected skin underneath application  . Penicillins Rash    Facial rash Has patient had a PCN reaction causing immediate rash, facial/tongue/throat swelling, SOB or lightheadedness with hypotension: Yes Has patient had a PCN reaction causing severe rash involving mucus membranes or skin necrosis: No Has patient had a PCN reaction that required hospitalization No Has patient had a PCN reaction occurring within the last 10 years: Yes If all of the above answers are "NO", then may proceed with Cephalosporin use.   . Simvastatin Rash     BP (!) 145/85   Pulse 94   Ht 5\' 6"  (1.676 m)   Wt 270 lb (122.5 kg)   BMI 43.58 kg/m   Physical Exam  Constitutional: She is oriented to person, place, and time. She appears well-developed and well-nourished.  Musculoskeletal:       Feet:  Neurological: She is alert and oriented to person, place, and time.  Psychiatric: She has a normal mood and affect. Judgment normal.  Vitals reviewed.    Medical decision-making Encounter Diagnoses  Name Primary?  . Left foot pain Yes  . Inflammatory arthritis      Recommend [employer] for molded foot orthotics   Arther Abbott, MD 09/04/2017 11:26 AM

## 2017-09-05 DIAGNOSIS — N183 Chronic kidney disease, stage 3 (moderate): Secondary | ICD-10-CM | POA: Diagnosis not present

## 2017-09-05 DIAGNOSIS — Z6841 Body Mass Index (BMI) 40.0 and over, adult: Secondary | ICD-10-CM | POA: Diagnosis not present

## 2017-09-05 DIAGNOSIS — M25561 Pain in right knee: Secondary | ICD-10-CM | POA: Diagnosis not present

## 2017-09-05 DIAGNOSIS — M797 Fibromyalgia: Secondary | ICD-10-CM | POA: Diagnosis not present

## 2017-09-05 DIAGNOSIS — M150 Primary generalized (osteo)arthritis: Secondary | ICD-10-CM | POA: Diagnosis not present

## 2017-09-05 DIAGNOSIS — M67441 Ganglion, right hand: Secondary | ICD-10-CM | POA: Diagnosis not present

## 2017-09-05 DIAGNOSIS — M79672 Pain in left foot: Secondary | ICD-10-CM | POA: Diagnosis not present

## 2017-09-05 DIAGNOSIS — M0589 Other rheumatoid arthritis with rheumatoid factor of multiple sites: Secondary | ICD-10-CM | POA: Diagnosis not present

## 2017-09-05 DIAGNOSIS — L989 Disorder of the skin and subcutaneous tissue, unspecified: Secondary | ICD-10-CM | POA: Diagnosis not present

## 2017-09-08 ENCOUNTER — Ambulatory Visit
Admission: RE | Admit: 2017-09-08 | Discharge: 2017-09-08 | Disposition: A | Discharge status: Home / Self Care | Payer: Medicare Other | Source: Ambulatory Visit | Attending: Internal Medicine | Admitting: Internal Medicine

## 2017-09-08 DIAGNOSIS — M546 Pain in thoracic spine: Secondary | ICD-10-CM

## 2017-09-08 DIAGNOSIS — M5124 Other intervertebral disc displacement, thoracic region: Secondary | ICD-10-CM | POA: Diagnosis not present

## 2017-09-16 DIAGNOSIS — M545 Low back pain: Secondary | ICD-10-CM | POA: Diagnosis not present

## 2017-09-16 DIAGNOSIS — N183 Chronic kidney disease, stage 3 (moderate): Secondary | ICD-10-CM | POA: Diagnosis not present

## 2017-09-16 DIAGNOSIS — R3915 Urgency of urination: Secondary | ICD-10-CM | POA: Diagnosis not present

## 2017-09-16 DIAGNOSIS — Z6841 Body Mass Index (BMI) 40.0 and over, adult: Secondary | ICD-10-CM | POA: Diagnosis not present

## 2017-10-04 DIAGNOSIS — G43819 Other migraine, intractable, without status migrainosus: Secondary | ICD-10-CM | POA: Diagnosis not present

## 2017-10-04 DIAGNOSIS — H348112 Central retinal vein occlusion, right eye, stable: Secondary | ICD-10-CM | POA: Diagnosis not present

## 2017-10-04 DIAGNOSIS — H401132 Primary open-angle glaucoma, bilateral, moderate stage: Secondary | ICD-10-CM | POA: Diagnosis not present

## 2017-10-04 DIAGNOSIS — E1165 Type 2 diabetes mellitus with hyperglycemia: Secondary | ICD-10-CM | POA: Diagnosis not present

## 2017-10-04 DIAGNOSIS — H10413 Chronic giant papillary conjunctivitis, bilateral: Secondary | ICD-10-CM | POA: Diagnosis not present

## 2017-10-04 DIAGNOSIS — H04123 Dry eye syndrome of bilateral lacrimal glands: Secondary | ICD-10-CM | POA: Diagnosis not present

## 2017-10-14 DIAGNOSIS — N390 Urinary tract infection, site not specified: Secondary | ICD-10-CM | POA: Diagnosis not present

## 2017-10-30 DIAGNOSIS — M546 Pain in thoracic spine: Secondary | ICD-10-CM | POA: Diagnosis not present

## 2017-10-30 DIAGNOSIS — Z6841 Body Mass Index (BMI) 40.0 and over, adult: Secondary | ICD-10-CM | POA: Diagnosis not present

## 2017-10-30 DIAGNOSIS — M5412 Radiculopathy, cervical region: Secondary | ICD-10-CM | POA: Diagnosis not present

## 2017-10-30 DIAGNOSIS — G8929 Other chronic pain: Secondary | ICD-10-CM | POA: Diagnosis not present

## 2017-11-07 DIAGNOSIS — M542 Cervicalgia: Secondary | ICD-10-CM | POA: Diagnosis not present

## 2017-11-07 DIAGNOSIS — I10 Essential (primary) hypertension: Secondary | ICD-10-CM | POA: Diagnosis not present

## 2017-11-07 DIAGNOSIS — Z6841 Body Mass Index (BMI) 40.0 and over, adult: Secondary | ICD-10-CM | POA: Diagnosis not present

## 2017-11-07 DIAGNOSIS — M5412 Radiculopathy, cervical region: Secondary | ICD-10-CM | POA: Diagnosis not present

## 2017-11-07 DIAGNOSIS — M546 Pain in thoracic spine: Secondary | ICD-10-CM | POA: Diagnosis not present

## 2017-11-08 DIAGNOSIS — H04123 Dry eye syndrome of bilateral lacrimal glands: Secondary | ICD-10-CM | POA: Diagnosis not present

## 2017-11-08 DIAGNOSIS — H401132 Primary open-angle glaucoma, bilateral, moderate stage: Secondary | ICD-10-CM | POA: Diagnosis not present

## 2017-11-08 DIAGNOSIS — E1165 Type 2 diabetes mellitus with hyperglycemia: Secondary | ICD-10-CM | POA: Diagnosis not present

## 2017-11-20 ENCOUNTER — Telehealth: Payer: Self-pay

## 2017-11-20 NOTE — Telephone Encounter (Signed)
LEft vm for Dr. Maryjean Ka assistant. RN receive clearance from  [facility] about patient needing clearance. RN left vm that patient was last seen 7/2016t by Dr. Leonie Man. PT was release back to her primary doctor. Rn also left message that Dr. Leonie Man does not prescribed her plavix. RN also left message that form states cardiac clearance request. Rn stated we are not her cardiologist. Rn also left on vm that they may want to reach out to patients PCP. Rn left contact number if she had any questions.

## 2017-11-25 DIAGNOSIS — E559 Vitamin D deficiency, unspecified: Secondary | ICD-10-CM | POA: Diagnosis not present

## 2017-11-25 DIAGNOSIS — E782 Mixed hyperlipidemia: Secondary | ICD-10-CM | POA: Diagnosis not present

## 2017-11-25 DIAGNOSIS — E119 Type 2 diabetes mellitus without complications: Secondary | ICD-10-CM | POA: Diagnosis not present

## 2017-11-27 DIAGNOSIS — N3281 Overactive bladder: Secondary | ICD-10-CM | POA: Diagnosis not present

## 2017-11-27 DIAGNOSIS — N183 Chronic kidney disease, stage 3 (moderate): Secondary | ICD-10-CM | POA: Diagnosis not present

## 2017-11-27 DIAGNOSIS — I10 Essential (primary) hypertension: Secondary | ICD-10-CM | POA: Diagnosis not present

## 2017-11-27 DIAGNOSIS — M545 Low back pain: Secondary | ICD-10-CM | POA: Diagnosis not present

## 2017-11-27 DIAGNOSIS — E782 Mixed hyperlipidemia: Secondary | ICD-10-CM | POA: Diagnosis not present

## 2017-11-27 DIAGNOSIS — G459 Transient cerebral ischemic attack, unspecified: Secondary | ICD-10-CM | POA: Diagnosis not present

## 2017-11-27 DIAGNOSIS — E559 Vitamin D deficiency, unspecified: Secondary | ICD-10-CM | POA: Diagnosis not present

## 2017-11-27 DIAGNOSIS — E1169 Type 2 diabetes mellitus with other specified complication: Secondary | ICD-10-CM | POA: Diagnosis not present

## 2017-11-27 DIAGNOSIS — M797 Fibromyalgia: Secondary | ICD-10-CM | POA: Diagnosis not present

## 2017-11-27 DIAGNOSIS — R1310 Dysphagia, unspecified: Secondary | ICD-10-CM | POA: Diagnosis not present

## 2017-11-27 DIAGNOSIS — D509 Iron deficiency anemia, unspecified: Secondary | ICD-10-CM | POA: Diagnosis not present

## 2017-11-27 DIAGNOSIS — G894 Chronic pain syndrome: Secondary | ICD-10-CM | POA: Diagnosis not present

## 2017-12-17 DIAGNOSIS — Z6841 Body Mass Index (BMI) 40.0 and over, adult: Secondary | ICD-10-CM | POA: Diagnosis not present

## 2017-12-17 DIAGNOSIS — N183 Chronic kidney disease, stage 3 (moderate): Secondary | ICD-10-CM | POA: Diagnosis not present

## 2017-12-17 DIAGNOSIS — M150 Primary generalized (osteo)arthritis: Secondary | ICD-10-CM | POA: Diagnosis not present

## 2017-12-17 DIAGNOSIS — M797 Fibromyalgia: Secondary | ICD-10-CM | POA: Diagnosis not present

## 2017-12-17 DIAGNOSIS — M79672 Pain in left foot: Secondary | ICD-10-CM | POA: Diagnosis not present

## 2017-12-17 DIAGNOSIS — M25561 Pain in right knee: Secondary | ICD-10-CM | POA: Diagnosis not present

## 2017-12-17 DIAGNOSIS — M67441 Ganglion, right hand: Secondary | ICD-10-CM | POA: Diagnosis not present

## 2017-12-17 DIAGNOSIS — L989 Disorder of the skin and subcutaneous tissue, unspecified: Secondary | ICD-10-CM | POA: Diagnosis not present

## 2017-12-17 DIAGNOSIS — M0589 Other rheumatoid arthritis with rheumatoid factor of multiple sites: Secondary | ICD-10-CM | POA: Diagnosis not present

## 2017-12-23 DIAGNOSIS — M546 Pain in thoracic spine: Secondary | ICD-10-CM | POA: Diagnosis not present

## 2017-12-24 ENCOUNTER — Ambulatory Visit (INDEPENDENT_AMBULATORY_CARE_PROVIDER_SITE_OTHER): Payer: Medicare Other | Admitting: Internal Medicine

## 2017-12-24 ENCOUNTER — Encounter (INDEPENDENT_AMBULATORY_CARE_PROVIDER_SITE_OTHER): Payer: Self-pay | Admitting: Internal Medicine

## 2017-12-24 ENCOUNTER — Encounter (INDEPENDENT_AMBULATORY_CARE_PROVIDER_SITE_OTHER): Payer: Self-pay | Admitting: *Deleted

## 2017-12-24 VITALS — BP 180/82 | HR 72 | Temp 97.5°F | Ht 65.5 in | Wt 266.7 lb

## 2017-12-24 DIAGNOSIS — R1310 Dysphagia, unspecified: Secondary | ICD-10-CM | POA: Diagnosis not present

## 2017-12-24 DIAGNOSIS — E1169 Type 2 diabetes mellitus with other specified complication: Secondary | ICD-10-CM | POA: Diagnosis not present

## 2017-12-24 DIAGNOSIS — G894 Chronic pain syndrome: Secondary | ICD-10-CM | POA: Diagnosis not present

## 2017-12-24 DIAGNOSIS — M545 Low back pain: Secondary | ICD-10-CM | POA: Diagnosis not present

## 2017-12-24 DIAGNOSIS — Z0001 Encounter for general adult medical examination with abnormal findings: Secondary | ICD-10-CM | POA: Diagnosis not present

## 2017-12-24 DIAGNOSIS — R1319 Other dysphagia: Secondary | ICD-10-CM

## 2017-12-24 DIAGNOSIS — Z1389 Encounter for screening for other disorder: Secondary | ICD-10-CM | POA: Diagnosis not present

## 2017-12-24 DIAGNOSIS — D509 Iron deficiency anemia, unspecified: Secondary | ICD-10-CM | POA: Diagnosis not present

## 2017-12-24 DIAGNOSIS — Z6841 Body Mass Index (BMI) 40.0 and over, adult: Secondary | ICD-10-CM | POA: Diagnosis not present

## 2017-12-24 DIAGNOSIS — G459 Transient cerebral ischemic attack, unspecified: Secondary | ICD-10-CM | POA: Diagnosis not present

## 2017-12-24 DIAGNOSIS — N3281 Overactive bladder: Secondary | ICD-10-CM | POA: Diagnosis not present

## 2017-12-24 DIAGNOSIS — R350 Frequency of micturition: Secondary | ICD-10-CM | POA: Diagnosis not present

## 2017-12-24 DIAGNOSIS — I10 Essential (primary) hypertension: Secondary | ICD-10-CM | POA: Diagnosis not present

## 2017-12-24 NOTE — Progress Notes (Signed)
Subjective:    Patient ID: [name], female    DOB: 19-Jul-1945, 72 y.o.   MRN: [id number]  HPI Referred by Dr. Wende Neighbors for dysphagia. She has had dysphagia x 4 -5 months.  Cold water and cold drinks bother eat.  She says any foods feel like they are slow to go down. She says she feels a lump sometimes in her esophagus.  Her appetite is not good. She says her weight is up and down. She has eating spurts at time. GERD controlled with Omeprazole.  She denies any abdominal pain.   Married. Worked at [employer] 4 children. Two deceased. One died at birth. One died of a brain aneurysm at age 31.    Plavix 75mg   For TIAs  Hx of diabetes x 3 yrs.  Hx of CABG in 1986 Review of Systems Past Medical History:  Diagnosis Date  . Anginal pain [facility])    sees Dr Einar Gip.   . Arthritis    rheumatoid ..   . Diabetes mellitus without complication (HCC)    Metformin 2.3.2017  . Diverticulitis   . Dysrhythmia   . Fibromyalgia   . GERD (gastroesophageal reflux disease)   . High cholesterol   . Hypertension   . Liver disease 08- 2012   per Dr Dagmar Hait pt has enlarged liver  . Peripheral vascular disease (HCC)    legs  . Pneumonia 06/17/2016  . Sleep apnea    sleep study  oct 2012  . Stroke [facility]) 2011   [facility]      Past Surgical History:  Procedure Laterality Date  . [place]  . CHOLECYSTECTOMY  1996  . CORONARY ARTERY BYPASS GRAFT  1986  . EYE SURGERY  2013   cat ext bilateral .  . EYE SURGERY  2002   laser  . [place]- 2007   rod right leg - right wrist plate  . GAS/FLUID EXCHANGE  03/25/2012   Procedure: GAS/FLUID EXCHANGE;  Surgeon: Hayden Pedro, MD;  Location: [place];  Service: Ophthalmology;  Laterality: Right;  . PARS PLANA VITRECTOMY  03/25/2012   Procedure: PARS PLANA VITRECTOMY WITH 25 GAUGE;  Surgeon: Hayden Pedro, MD;  Location: [place];  Service: Ophthalmology;  Laterality: Right;    Allergies  Allergen Reactions   . Fentanyl Anaphylaxis and Shortness Of Breath  . Cefdinir     Patient isn't sure if she is allergic to this medication.  . Lactose Intolerance (Gi) Other (See Comments)    G.I. Upset  . Butrans [Buprenorphine] Rash and Other (See Comments)    Infected skin underneath application  . Penicillins Rash    Facial rash Has patient had a PCN reaction causing immediate rash, facial/tongue/throat swelling, SOB or lightheadedness with hypotension: Yes Has patient had a PCN reaction causing severe rash involving mucus membranes or skin necrosis: No Has patient had a PCN reaction that required hospitalization No Has patient had a PCN reaction occurring within the last 10 years: Yes If all of the above answers are "NO", then may proceed with Cephalosporin use.   . Simvastatin Rash    Current Outpatient Medications on File Prior to Visit  Medication Sig Dispense Refill  . clopidogrel (PLAVIX) 75 MG tablet Take 1 tablet (75 mg total) by mouth daily with breakfast. 90 tablet 6  . diclofenac sodium (VOLTAREN) 1 % GEL Apply 2 g topically daily as needed (Pain).    Marland Kitchen diclofenac sodium (VOLTAREN) 1 %  GEL Apply topically 4 (four) times daily.    Marland Kitchen glipiZIDE (GLUCOTROL) 10 MG tablet Take 10 mg by mouth daily before breakfast.    . HYDROcodone-acetaminophen (NORCO) 10-325 MG tablet Take 1 tablet by mouth 4 (four) times daily.    Marland Kitchen leflunomide (ARAVA) 20 MG tablet Take 20 mg by mouth daily.    . metoprolol succinate (TOPROL-XL) 25 MG 24 hr tablet Take 25 mg by mouth daily.    . Olmesartan-amLODIPine-HCTZ (TRIBENZOR) 40-5-25 MG TABS Take by mouth daily.    Marland Kitchen OMEPRAZOLE PO Take 40 mg by mouth.    . pravastatin (PRAVACHOL) 40 MG tablet Take 40 mg by mouth at bedtime.    . predniSONE (DELTASONE) 5 MG tablet Take 5 mg by mouth daily with breakfast.    . Tafluprost (ZIOPTAN) 0.0015 % SOLN Place 1 drop into both eyes at bedtime.     . temazepam (RESTORIL) 30 MG capsule Take 30 mg by mouth at bedtime.    .  timolol (BETIMOL) 0.5 % ophthalmic solution 1 drop 2 (two) times daily.    Marland Kitchen VITAMIN D, ERGOCALCIFEROL, PO Take 50,000 Units by mouth once a week.    . Blood Glucose Monitoring Suppl (FREESTYLE LITE) DEVI See admin instructions.  0   No current facility-administered medications on file prior to visit.         Objective:   Physical Exam Blood pressure (!) 180/82, pulse 72, temperature (!) 97.5 F (36.4 C), height 5' 5.5" (1.664 m), weight 266 lb 11.2 oz (121 kg). Alert and oriented. Skin warm and dry. Oral mucosa is moist.   . Sclera anicteric, conjunctivae is pink. Thyroid not enlarged. No cervical lymphadenopathy. Lungs clear. Heart regular rate and rhythm.  Abdomen is soft. Bowel sounds are positive. No hepatomegaly. No abdominal masses felt. No tenderness.  No edema to lower extremities.          Assessment & Plan:  Dysphagia to solid and liquids. Esophagram.

## 2017-12-24 NOTE — Patient Instructions (Addendum)
Esophagram.  

## 2017-12-26 ENCOUNTER — Ambulatory Visit (HOSPITAL_COMMUNITY)
Admission: RE | Admit: 2017-12-26 | Discharge: 2017-12-26 | Disposition: A | Discharge status: Home / Self Care | Payer: Medicare Other | Source: Ambulatory Visit | Attending: Internal Medicine | Admitting: Internal Medicine

## 2017-12-26 DIAGNOSIS — R1310 Dysphagia, unspecified: Secondary | ICD-10-CM | POA: Diagnosis not present

## 2017-12-26 DIAGNOSIS — R1319 Other dysphagia: Secondary | ICD-10-CM

## 2017-12-26 DIAGNOSIS — T17320A Food in larynx causing asphyxiation, initial encounter: Secondary | ICD-10-CM | POA: Diagnosis not present

## 2017-12-26 DIAGNOSIS — K224 Dyskinesia of esophagus: Secondary | ICD-10-CM | POA: Diagnosis not present

## 2018-01-02 ENCOUNTER — Telehealth (INDEPENDENT_AMBULATORY_CARE_PROVIDER_SITE_OTHER): Payer: Self-pay | Admitting: Internal Medicine

## 2018-01-02 ENCOUNTER — Encounter (INDEPENDENT_AMBULATORY_CARE_PROVIDER_SITE_OTHER): Payer: Self-pay | Admitting: *Deleted

## 2018-01-02 ENCOUNTER — Other Ambulatory Visit (INDEPENDENT_AMBULATORY_CARE_PROVIDER_SITE_OTHER): Payer: Self-pay | Admitting: Internal Medicine

## 2018-01-02 DIAGNOSIS — R1319 Other dysphagia: Secondary | ICD-10-CM

## 2018-01-02 DIAGNOSIS — R1310 Dysphagia, unspecified: Secondary | ICD-10-CM

## 2018-01-02 NOTE — Telephone Encounter (Signed)
EGD/ED. Taking Plavix

## 2018-01-02 NOTE — Telephone Encounter (Signed)
err

## 2018-01-02 NOTE — Telephone Encounter (Signed)
EGD/ED sch'd 01/13/18 at 11:15, patient aware, instructions mailed

## 2018-01-03 DIAGNOSIS — R1319 Other dysphagia: Secondary | ICD-10-CM | POA: Insufficient documentation

## 2018-01-03 DIAGNOSIS — R1310 Dysphagia, unspecified: Secondary | ICD-10-CM | POA: Insufficient documentation

## 2018-01-06 ENCOUNTER — Telehealth (INDEPENDENT_AMBULATORY_CARE_PROVIDER_SITE_OTHER): Payer: Self-pay | Admitting: *Deleted

## 2018-01-06 DIAGNOSIS — R110 Nausea: Secondary | ICD-10-CM | POA: Diagnosis not present

## 2018-01-06 DIAGNOSIS — N3281 Overactive bladder: Secondary | ICD-10-CM | POA: Diagnosis not present

## 2018-01-06 DIAGNOSIS — E782 Mixed hyperlipidemia: Secondary | ICD-10-CM | POA: Diagnosis not present

## 2018-01-06 DIAGNOSIS — R1084 Generalized abdominal pain: Secondary | ICD-10-CM | POA: Diagnosis not present

## 2018-01-06 DIAGNOSIS — R1310 Dysphagia, unspecified: Secondary | ICD-10-CM | POA: Diagnosis not present

## 2018-01-06 DIAGNOSIS — R350 Frequency of micturition: Secondary | ICD-10-CM | POA: Diagnosis not present

## 2018-01-06 DIAGNOSIS — I10 Essential (primary) hypertension: Secondary | ICD-10-CM | POA: Diagnosis not present

## 2018-01-06 DIAGNOSIS — R142 Eructation: Secondary | ICD-10-CM | POA: Diagnosis not present

## 2018-01-06 DIAGNOSIS — E119 Type 2 diabetes mellitus without complications: Secondary | ICD-10-CM | POA: Diagnosis not present

## 2018-01-06 DIAGNOSIS — D509 Iron deficiency anemia, unspecified: Secondary | ICD-10-CM | POA: Diagnosis not present

## 2018-01-06 DIAGNOSIS — N183 Chronic kidney disease, stage 3 (moderate): Secondary | ICD-10-CM | POA: Diagnosis not present

## 2018-01-06 DIAGNOSIS — R1013 Epigastric pain: Secondary | ICD-10-CM | POA: Diagnosis not present

## 2018-01-06 DIAGNOSIS — Z6841 Body Mass Index (BMI) 40.0 and over, adult: Secondary | ICD-10-CM | POA: Diagnosis not present

## 2018-01-06 DIAGNOSIS — G459 Transient cerebral ischemic attack, unspecified: Secondary | ICD-10-CM | POA: Diagnosis not present

## 2018-01-06 DIAGNOSIS — M545 Low back pain: Secondary | ICD-10-CM | POA: Diagnosis not present

## 2018-01-06 DIAGNOSIS — G894 Chronic pain syndrome: Secondary | ICD-10-CM | POA: Diagnosis not present

## 2018-01-06 DIAGNOSIS — E1169 Type 2 diabetes mellitus with other specified complication: Secondary | ICD-10-CM | POA: Diagnosis not present

## 2018-01-06 NOTE — Telephone Encounter (Signed)
Per Caryl Pina with Dr Nevada Crane it is ok for patient stop Plavix 5 days prior to EGD 01/13/18, patient aware

## 2018-01-13 ENCOUNTER — Encounter (HOSPITAL_COMMUNITY): Payer: Self-pay | Admitting: *Deleted

## 2018-01-13 ENCOUNTER — Encounter (HOSPITAL_COMMUNITY)
Admission: RE | Disposition: A | Discharge status: Home / Self Care | Payer: Self-pay | Source: Ambulatory Visit | Attending: Internal Medicine

## 2018-01-13 ENCOUNTER — Ambulatory Visit (HOSPITAL_COMMUNITY)
Admission: RE | Admit: 2018-01-13 | Discharge: 2018-01-13 | Disposition: A | Discharge status: Home / Self Care | Payer: Medicare Other | Source: Ambulatory Visit | Attending: Internal Medicine | Admitting: Internal Medicine

## 2018-01-13 ENCOUNTER — Other Ambulatory Visit: Payer: Self-pay

## 2018-01-13 DIAGNOSIS — D175 Benign lipomatous neoplasm of intra-abdominal organs: Secondary | ICD-10-CM | POA: Insufficient documentation

## 2018-01-13 DIAGNOSIS — Z8673 Personal history of transient ischemic attack (TIA), and cerebral infarction without residual deficits: Secondary | ICD-10-CM | POA: Diagnosis not present

## 2018-01-13 DIAGNOSIS — R1310 Dysphagia, unspecified: Secondary | ICD-10-CM

## 2018-01-13 DIAGNOSIS — I499 Cardiac arrhythmia, unspecified: Secondary | ICD-10-CM | POA: Diagnosis not present

## 2018-01-13 DIAGNOSIS — M797 Fibromyalgia: Secondary | ICD-10-CM | POA: Diagnosis not present

## 2018-01-13 DIAGNOSIS — G4730 Sleep apnea, unspecified: Secondary | ICD-10-CM | POA: Diagnosis not present

## 2018-01-13 DIAGNOSIS — E739 Lactose intolerance, unspecified: Secondary | ICD-10-CM | POA: Insufficient documentation

## 2018-01-13 DIAGNOSIS — Z7984 Long term (current) use of oral hypoglycemic drugs: Secondary | ICD-10-CM | POA: Insufficient documentation

## 2018-01-13 DIAGNOSIS — Z951 Presence of aortocoronary bypass graft: Secondary | ICD-10-CM | POA: Diagnosis not present

## 2018-01-13 DIAGNOSIS — Z9049 Acquired absence of other specified parts of digestive tract: Secondary | ICD-10-CM | POA: Diagnosis not present

## 2018-01-13 DIAGNOSIS — I10 Essential (primary) hypertension: Secondary | ICD-10-CM | POA: Diagnosis not present

## 2018-01-13 DIAGNOSIS — Z888 Allergy status to other drugs, medicaments and biological substances status: Secondary | ICD-10-CM | POA: Diagnosis not present

## 2018-01-13 DIAGNOSIS — Z880 Allergy status to penicillin: Secondary | ICD-10-CM | POA: Insufficient documentation

## 2018-01-13 DIAGNOSIS — R1314 Dysphagia, pharyngoesophageal phase: Secondary | ICD-10-CM | POA: Diagnosis not present

## 2018-01-13 DIAGNOSIS — E1151 Type 2 diabetes mellitus with diabetic peripheral angiopathy without gangrene: Secondary | ICD-10-CM | POA: Diagnosis not present

## 2018-01-13 DIAGNOSIS — Z87891 Personal history of nicotine dependence: Secondary | ICD-10-CM | POA: Diagnosis not present

## 2018-01-13 DIAGNOSIS — K219 Gastro-esophageal reflux disease without esophagitis: Secondary | ICD-10-CM | POA: Insufficient documentation

## 2018-01-13 DIAGNOSIS — Z8711 Personal history of peptic ulcer disease: Secondary | ICD-10-CM | POA: Diagnosis not present

## 2018-01-13 DIAGNOSIS — Z885 Allergy status to narcotic agent status: Secondary | ICD-10-CM | POA: Insufficient documentation

## 2018-01-13 DIAGNOSIS — Z8261 Family history of arthritis: Secondary | ICD-10-CM | POA: Diagnosis not present

## 2018-01-13 DIAGNOSIS — K449 Diaphragmatic hernia without obstruction or gangrene: Secondary | ICD-10-CM | POA: Insufficient documentation

## 2018-01-13 DIAGNOSIS — Z7902 Long term (current) use of antithrombotics/antiplatelets: Secondary | ICD-10-CM | POA: Insufficient documentation

## 2018-01-13 DIAGNOSIS — M069 Rheumatoid arthritis, unspecified: Secondary | ICD-10-CM | POA: Diagnosis not present

## 2018-01-13 DIAGNOSIS — K2950 Unspecified chronic gastritis without bleeding: Secondary | ICD-10-CM | POA: Diagnosis not present

## 2018-01-13 DIAGNOSIS — M1990 Unspecified osteoarthritis, unspecified site: Secondary | ICD-10-CM | POA: Insufficient documentation

## 2018-01-13 DIAGNOSIS — K2970 Gastritis, unspecified, without bleeding: Secondary | ICD-10-CM | POA: Diagnosis not present

## 2018-01-13 DIAGNOSIS — E7800 Pure hypercholesterolemia, unspecified: Secondary | ICD-10-CM | POA: Insufficient documentation

## 2018-01-13 DIAGNOSIS — R160 Hepatomegaly, not elsewhere classified: Secondary | ICD-10-CM | POA: Diagnosis not present

## 2018-01-13 DIAGNOSIS — Z8249 Family history of ischemic heart disease and other diseases of the circulatory system: Secondary | ICD-10-CM | POA: Insufficient documentation

## 2018-01-13 DIAGNOSIS — R1013 Epigastric pain: Secondary | ICD-10-CM | POA: Diagnosis not present

## 2018-01-13 DIAGNOSIS — Z881 Allergy status to other antibiotic agents status: Secondary | ICD-10-CM | POA: Insufficient documentation

## 2018-01-13 DIAGNOSIS — Z79899 Other long term (current) drug therapy: Secondary | ICD-10-CM | POA: Insufficient documentation

## 2018-01-13 DIAGNOSIS — K228 Other specified diseases of esophagus: Secondary | ICD-10-CM | POA: Diagnosis not present

## 2018-01-13 DIAGNOSIS — R1319 Other dysphagia: Secondary | ICD-10-CM

## 2018-01-13 DIAGNOSIS — K3189 Other diseases of stomach and duodenum: Secondary | ICD-10-CM | POA: Insufficient documentation

## 2018-01-13 HISTORY — PX: ESOPHAGOGASTRODUODENOSCOPY: SHX5428

## 2018-01-13 HISTORY — PX: ESOPHAGEAL DILATION: SHX303

## 2018-01-13 LAB — GLUCOSE, CAPILLARY: GLUCOSE-CAPILLARY: 163 mg/dL — AB (ref 70–99)

## 2018-01-13 SURGERY — EGD (ESOPHAGOGASTRODUODENOSCOPY)
Anesthesia: Moderate Sedation

## 2018-01-13 MED ORDER — MIDAZOLAM HCL 5 MG/5ML IJ SOLN
INTRAMUSCULAR | Status: DC | PRN
  Administered 2018-01-13 (×2): 1 mg via INTRAVENOUS
  Administered 2018-01-13 (×2): 2 mg via INTRAVENOUS

## 2018-01-13 MED ORDER — MEPERIDINE HCL 50 MG/ML IJ SOLN
INTRAMUSCULAR | Status: AC
  Filled 2018-01-13: qty 1

## 2018-01-13 MED ORDER — STERILE WATER FOR IRRIGATION IR SOLN
Status: DC | PRN
  Administered 2018-01-13: 12:00:00

## 2018-01-13 MED ORDER — MIDAZOLAM HCL 5 MG/5ML IJ SOLN
INTRAMUSCULAR | Status: AC
  Filled 2018-01-13: qty 10

## 2018-01-13 MED ORDER — MEPERIDINE HCL 50 MG/ML IJ SOLN
INTRAMUSCULAR | Status: DC | PRN
  Administered 2018-01-13 (×2): 25 mg via INTRAVENOUS

## 2018-01-13 MED ORDER — LIDOCAINE VISCOUS HCL 2 % MT SOLN
OROMUCOSAL | Status: AC
  Filled 2018-01-13: qty 15

## 2018-01-13 MED ORDER — SODIUM CHLORIDE 0.9 % IV SOLN
INTRAVENOUS | Status: DC
  Administered 2018-01-13: 11:00:00 via INTRAVENOUS

## 2018-01-13 NOTE — Discharge Instructions (Signed)
No aspirin or NSAIDs for 24 hours. Resume clopidogrel/Plavix on 01/14/2018. Resume other medications as before. Resume usual diet. No driving for 24 hours. Physician will call with biopsy results.   Upper Endoscopy, Care After Refer to this sheet in the next few weeks. These instructions provide you with information about caring for yourself after your procedure. Your health care provider may also give you more specific instructions. Your treatment has been planned according to current medical practices, but problems sometimes occur. Call your health care provider if you have any problems or questions after your procedure. What can I expect after the procedure? After the procedure, it is common to have:  A sore throat.  Bloating.  Nausea.  Follow these instructions at home:  Follow instructions from your health care provider about what to eat or drink after your procedure.  Return to your normal activities as told by your health care provider. Ask your health care provider what activities are safe for you.  Take over-the-counter and prescription medicines only as told by your health care provider.  Do not drive for 24 hours if you received a sedative.  Keep all follow-up visits as told by your health care provider. This is important. Contact a health care provider if:  You have a sore throat that lasts longer than one day.  You have trouble swallowing. Get help right away if:  You have a fever.  You vomit blood or your vomit looks like coffee grounds.  You have bloody, black, or tarry stools.  You have a severe sore throat or you cannot swallow.  You have difficulty breathing.  You have severe pain in your chest or belly. This information is not intended to replace advice given to you by your health care provider. Make sure you discuss any questions you have with your health care provider. Document Released: 11/27/2011 Document Revised: 11/03/2015 Document Reviewed:  03/10/2015 Elsevier Interactive Patient Education  [employer].

## 2018-01-13 NOTE — Op Note (Addendum)
[facility] Patient Name: [name] Procedure Date: 01/13/2018 11:10 AM MRN: [id number] Date of Birth: 1946/04/29 Attending MD: Hildred Laser , MD CSN: [id number] Age: 72 Admit Type: Outpatient Procedure:                Upper GI endoscopy Indications:              Epigastric abdominal pain, Esophageal dysphagia Providers:                Hildred Laser, MD, Hinton Rao, RN, Randa Spike, Technician Referring MD:             Delphina Cahill, MD Medicines:                Lidocaine lelly, Meperidine 50 mg IV, Midazolam 6                            mg IV Complications:            No immediate complications. Estimated Blood Loss:     Estimated blood loss was minimal. Procedure:                Pre-Anesthesia Assessment:                           - Prior to the procedure, a History and Physical                            was performed, and patient medications and                            allergies were reviewed. The patient's tolerance of                            previous anesthesia was also reviewed. The risks                            and benefits of the procedure and the sedation                            options and risks were discussed with the patient.                            All questions were answered, and informed consent                            was obtained. Prior Anticoagulants: The patient                            last took Plavix (clopidogrel) 5 days prior to the                            procedure. ASA Grade Assessment: III - A patient  with severe systemic disease. After reviewing the                            risks and benefits, the patient was deemed in                            satisfactory condition to undergo the procedure.                           After obtaining informed consent, the endoscope was                            passed under direct vision. Throughout the   procedure, the patient's blood pressure, pulse, and                            oxygen saturations were monitored continuously. The                            GIF-H190 ([id number]) scope was introduced through the                            mouth, and advanced to the second part of duodenum.                            The upper GI endoscopy was accomplished without                            difficulty. The patient tolerated the procedure                            well. Scope In: 11:41:23 AM Scope Out: 11:56:26 AM Total Procedure Duration: 0 hours 15 minutes 3 seconds  Findings:      There was a small lipoma 5 mm in diameter in the distal esophagus, 34 cm       from the incisors.      The Z-line was irregular and was found 36 cm from the incisors.      A 2 cm hiatal hernia was present.      No endoscopic abnormality was evident in the esophagus to explain the       patient's complaint of dysphagia. It was decided, however, to proceed       with dilation of the entire esophagus. The dilation site was examined       following endoscope reinsertion and showed no change and no bleeding,       mucosal tear or perforation.      There was a small lipoma, 5 to 7 mm in diameter, in the gastric fundus.       It was palpated wirh foreceps.      A healed ulcer was found in the prepyloric region of the stomach.      Patchy mild inflammation characterized by congestion (edema) and       erythema was found in the gastric antrum and in the prepyloric region of       the stomach. Biopsies were taken with a cold forceps for histology.  The exam of the stomach was otherwise normal.      The duodenal bulb and second portion of the duodenum were normal. Impression:               - Small esophageal lipoma.                           - Z-line irregular, 36 cm from the incisors.                           - 2 cm hiatal hernia.                           - No endoscopic esophageal abnormality to explain                             patient's dysphagia. Esophagus dilated.                           - Small gastric lipoma.                           - Scar in the prepyloric region of the stomach.                           - Gastritis. Biopsied.                           - Normal duodenal bulb and second portion of the                            duodenum. Moderate Sedation:      Moderate (conscious) sedation was administered by the endoscopy nurse       and supervised by the endoscopist. The following parameters were       monitored: oxygen saturation, heart rate, blood pressure, CO2       capnography and response to care. Total physician intraservice time was       25 minutes. Recommendation:           - Patient has a contact number available for                            emergencies. The signs and symptoms of potential                            delayed complications were discussed with the                            patient. Return to normal activities tomorrow.                            Written discharge instructions were provided to the                            patient.                           -  Resume previous diet today.                           - Continue present medications.                           - No aspirin, ibuprofen, naproxen, or other                            non-steroidal anti-inflammatory drugs for 1 day.                           - Resume Plavix (clopidogrel) at prior dose                            tomorrow.                           - Await pathology results. Procedure Code(s):        --- Professional ---                           [phone], Esophagogastroduodenoscopy, flexible,                            transoral; with biopsy, single or multiple                           G0500, Moderate sedation services provided by the                            same physician or other qualified health care                            professional performing a gastrointestinal                             endoscopic service that sedation supports,                            requiring the presence of an independent trained                            observer to assist in the monitoring of the                            patient's level of consciousness and physiological                            status; initial 15 minutes of intra-service time;                            patient age 29 years or older (additional time may                            be reported with [phone], as  appropriate)                           [id number], Moderate sedation services provided by the                            same physician or other qualified health care                            professional performing the diagnostic or                            therapeutic service that the sedation supports,                            requiring the presence of an independent trained                            observer to assist in the monitoring of the                            patient's level of consciousness and physiological                            status; each additional 15 minutes intraservice                            time (List separately in addition to code for                            primary service) Diagnosis Code(s):        --- Professional ---                           D17.5, Benign lipomatous neoplasm of                            intra-abdominal organs                           K22.8, Other specified diseases of esophagus                           K44.9, Diaphragmatic hernia without obstruction or                            gangrene                           K31.89, Other diseases of stomach and duodenum                           K29.70, Gastritis, unspecified, without bleeding                           R10.13, Epigastric pain  R13.14, Dysphagia, pharyngoesophageal phase CPT copyright 2017 American [employer] Association. All rights reserved. The codes documented in  this report are preliminary and upon coder review may  be revised to meet current compliance requirements. Hildred Laser, MD Hildred Laser, MD 01/13/2018 12:09:04 PM This report has been signed electronically. Number of Addenda: 1 Addendum Number: 1   Addendum Date: 02/10/2018 11:01:43 AM      Esophagus dilated by passing 27 Fr Maloney dilator.      Post dilation exam revealed no mucosa disruption. Hildred Laser, MD Hildred Laser, MD 02/10/2018 11:04:17 AM This report has been signed electronically.

## 2018-01-13 NOTE — H&P (Signed)
[name] is an 72 y.o. female.   Chief Complaint: Patient is here for EGD and ED. HPI: Patient is 71 year old Caucasian female with multiple medical problems who presents with 6 months history of dysphagia primarily to solids but she also has difficulty with cold liquids.  She points to the suprasternal area sort of bolus obstruction.  She also has noted burning epigastric pain and frequent burping.  She has not had any nausea or vomiting.  She also denies melena.  She had a barium swallow recently suggested esophageal dysmotility and barium pill got held at [facility] before passing down. Plavix has been on hold for 5 days.  Past Medical History:  Diagnosis Date  . Anginal pain [name])    sees [name].   . Arthritis    rheumatoid ..   . Diabetes mellitus without complication (HCC)    Metformin 2.3.2017  . Diverticulitis   . Dysrhythmia   . Fibromyalgia   . GERD (gastroesophageal reflux disease)   . High cholesterol   . Hypertension   . Liver disease 08- 2012   per [name] pt has enlarged liver  . Peripheral vascular disease (HCC)    legs  . Pneumonia 06/17/2016  . Sleep apnea    sleep study  oct 2012  . Stroke [name]) 2011   [name] [place]      Past Surgical History:  Procedure Laterality Date  . [place]  . CHOLECYSTECTOMY  1996  . CORONARY ARTERY BYPASS GRAFT  1986  . EYE SURGERY  2013   cat ext bilateral .  . EYE SURGERY  2002   laser  . [place]- 2007   rod right leg - right wrist plate  . GAS/FLUID EXCHANGE  03/25/2012   Procedure: GAS/FLUID EXCHANGE;  Surgeon: Hayden Pedro, MD;  Location: [facility];  Service: Ophthalmology;  Laterality: Right;  . PARS PLANA VITRECTOMY  03/25/2012   Procedure: PARS PLANA VITRECTOMY WITH 25 GAUGE;  Surgeon: Hayden Pedro, MD;  Location: [facility];  Service: Ophthalmology;  Laterality: Right;    Family History  Problem Relation Age of Onset  . Hypertension Daughter   . Rheum arthritis  Maternal Grandmother    Social History:  reports that she quit smoking about 33 years ago. Her smoking use included cigarettes. She has a 1.25 pack-year smoking history. She has never used smokeless tobacco. She reports that she drinks alcohol. She reports that she does not use drugs.  Allergies:  Allergies  Allergen Reactions  . Fentanyl Anaphylaxis and Shortness Of Breath  . Cefdinir     Patient isn't sure if she is allergic to this medication.  . Lactose Intolerance (Gi) Other (See Comments)    G.I. Upset  . Butrans [Buprenorphine] Rash and Other (See Comments)    Infected skin underneath application  . Penicillins Rash    Facial rash Has patient had a PCN reaction causing immediate rash, facial/tongue/throat swelling, SOB or lightheadedness with hypotension: Yes Has patient had a PCN reaction causing severe rash involving mucus membranes or skin necrosis: No Has patient had a PCN reaction that required hospitalization No Has patient had a PCN reaction occurring within the last 10 years: Yes If all of the above answers are "NO", then may proceed with Cephalosporin use.   . Simvastatin Rash    Medications Prior to Admission  Medication Sig Dispense Refill  . diclofenac sodium (VOLTAREN) 1 % GEL Apply 2 g topically 4 (  four) times daily as needed (Pain).     Marland Kitchen glipiZIDE (GLUCOTROL) 10 MG tablet Take 10 mg by mouth daily before breakfast.    . HYDROcodone-acetaminophen (NORCO) 10-325 MG tablet Take 1 tablet by mouth 4 (four) times daily.    Marland Kitchen ibuprofen (ADVIL,MOTRIN) 200 MG tablet Take 200 mg by mouth every 6 (six) hours as needed.    . leflunomide (ARAVA) 20 MG tablet Take 20 mg by mouth daily.    . metoprolol succinate (TOPROL-XL) 25 MG 24 hr tablet Take 25 mg by mouth daily.    . Olmesartan-amLODIPine-HCTZ (TRIBENZOR) 40-5-25 MG TABS Take 1 tablet by mouth daily.     . pantoprazole (PROTONIX) 40 MG tablet Take 40 mg by mouth daily.    . pravastatin (PRAVACHOL) 40 MG tablet Take  40 mg by mouth at bedtime.    . Tafluprost (ZIOPTAN) 0.0015 % SOLN Place 1 drop into both eyes at bedtime.     . temazepam (RESTORIL) 30 MG capsule Take 30 mg by mouth at bedtime.    . timolol (BETIMOL) 0.5 % ophthalmic solution 1 drop 2 (two) times daily.    Marland Kitchen VITAMIN D, ERGOCALCIFEROL, PO Take 50,000 Units by mouth once a week.    . Blood Glucose Monitoring Suppl (FREESTYLE LITE) DEVI See admin instructions.  0  . clopidogrel (PLAVIX) 75 MG tablet Take 1 tablet (75 mg total) by mouth daily with breakfast. 90 tablet 6    Results for orders placed or performed during the Braun encounter of 01/13/18 (from the past 48 hour(s))  Glucose, capillary     Status: Abnormal   Collection Time: 01/13/18 10:31 AM  Result Value Ref Range   Glucose-Capillary 163 (H) 70 - 99 mg/dL   No results found.  ROS  Blood pressure (!) 116/57, pulse 86, temperature 97.7 F (36.5 C), temperature source Oral, resp. rate 12, height 5' 5.5" (1.664 m), weight 266 lb (120.7 kg), SpO2 93 %. Physical Exam  Constitutional: She appears well-developed and well-nourished.  HENT:  Mouth/Throat: Oropharynx is clear and moist.  Eyes: Conjunctivae are normal. No scleral icterus.  Neck: No thyromegaly present.  Cardiovascular: Normal rate, regular rhythm and normal heart sounds.  No murmur heard. Respiratory: Effort normal and breath sounds normal.  GI:  Abdomen is full.  Soft and nontender with organomegaly or masses.  Musculoskeletal: She exhibits no edema.  Lymphadenopathy:    She has no cervical adenopathy.  Neurological: She is alert.  Skin: Skin is warm and dry.     Assessment/Plan Dysphagia to solids and cold liquids. Epigastric pain. EGD with ED.  Hildred Laser, MD 01/13/2018, 11:27 AM

## 2018-01-17 ENCOUNTER — Encounter (HOSPITAL_COMMUNITY): Payer: Self-pay | Admitting: Internal Medicine

## 2018-01-20 DIAGNOSIS — Z6841 Body Mass Index (BMI) 40.0 and over, adult: Secondary | ICD-10-CM | POA: Diagnosis not present

## 2018-01-20 DIAGNOSIS — M546 Pain in thoracic spine: Secondary | ICD-10-CM | POA: Diagnosis not present

## 2018-01-20 DIAGNOSIS — I10 Essential (primary) hypertension: Secondary | ICD-10-CM | POA: Diagnosis not present

## 2018-01-29 DIAGNOSIS — K220 Achalasia of cardia: Secondary | ICD-10-CM | POA: Diagnosis not present

## 2018-01-29 DIAGNOSIS — R1310 Dysphagia, unspecified: Secondary | ICD-10-CM | POA: Diagnosis not present

## 2018-02-06 ENCOUNTER — Ambulatory Visit (HOSPITAL_COMMUNITY)
Admission: RE | Admit: 2018-02-06 | Discharge: 2018-02-06 | Disposition: A | Discharge status: Home / Self Care | Payer: Medicare Other | Source: Ambulatory Visit | Attending: Internal Medicine | Admitting: Internal Medicine

## 2018-02-06 ENCOUNTER — Other Ambulatory Visit: Payer: Self-pay | Admitting: Internal Medicine

## 2018-02-06 DIAGNOSIS — N200 Calculus of kidney: Secondary | ICD-10-CM | POA: Insufficient documentation

## 2018-02-06 DIAGNOSIS — K760 Fatty (change of) liver, not elsewhere classified: Secondary | ICD-10-CM | POA: Diagnosis not present

## 2018-02-06 DIAGNOSIS — R109 Unspecified abdominal pain: Secondary | ICD-10-CM | POA: Diagnosis not present

## 2018-02-06 DIAGNOSIS — R634 Abnormal weight loss: Secondary | ICD-10-CM | POA: Insufficient documentation

## 2018-02-06 DIAGNOSIS — R748 Abnormal levels of other serum enzymes: Secondary | ICD-10-CM

## 2018-02-06 DIAGNOSIS — Z6839 Body mass index (BMI) 39.0-39.9, adult: Secondary | ICD-10-CM | POA: Diagnosis not present

## 2018-02-06 DIAGNOSIS — R1013 Epigastric pain: Secondary | ICD-10-CM | POA: Diagnosis not present

## 2018-02-06 DIAGNOSIS — K449 Diaphragmatic hernia without obstruction or gangrene: Secondary | ICD-10-CM | POA: Diagnosis not present

## 2018-02-06 DIAGNOSIS — R110 Nausea: Secondary | ICD-10-CM | POA: Diagnosis not present

## 2018-02-06 DIAGNOSIS — R142 Eructation: Secondary | ICD-10-CM | POA: Diagnosis not present

## 2018-02-06 DIAGNOSIS — K5730 Diverticulosis of large intestine without perforation or abscess without bleeding: Secondary | ICD-10-CM | POA: Insufficient documentation

## 2018-02-07 ENCOUNTER — Other Ambulatory Visit (INDEPENDENT_AMBULATORY_CARE_PROVIDER_SITE_OTHER): Payer: Self-pay | Admitting: *Deleted

## 2018-02-07 ENCOUNTER — Telehealth (INDEPENDENT_AMBULATORY_CARE_PROVIDER_SITE_OTHER): Payer: Self-pay | Admitting: Internal Medicine

## 2018-02-07 DIAGNOSIS — R935 Abnormal findings on diagnostic imaging of other abdominal regions, including retroperitoneum: Secondary | ICD-10-CM

## 2018-02-07 MED ORDER — ISOSORBIDE DINITRATE 5 MG PO TABS: 5.0000 mg | ORAL_TABLET | Freq: Three times a day (TID) | ORAL | 1 refills | Status: DC

## 2018-02-07 NOTE — Telephone Encounter (Signed)
Esophageal manometry results reviewed with patient. 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 Treatment options reviewed with the patient over the phone Will try her on isosorbide dinitrate 5 mg 30 minutes before each meal.  Patient informed of potential side effects.  Patient is in the process of being scheduled for colonoscopy because of abnormal CT. Vernard Gambles advised to hold off Plavix after dose on February 09, 2018.

## 2018-02-11 ENCOUNTER — Telehealth (INDEPENDENT_AMBULATORY_CARE_PROVIDER_SITE_OTHER): Payer: Self-pay | Admitting: *Deleted

## 2018-02-11 ENCOUNTER — Encounter (INDEPENDENT_AMBULATORY_CARE_PROVIDER_SITE_OTHER): Payer: Self-pay | Admitting: *Deleted

## 2018-02-11 DIAGNOSIS — R935 Abnormal findings on diagnostic imaging of other abdominal regions, including retroperitoneum: Secondary | ICD-10-CM | POA: Insufficient documentation

## 2018-02-11 MED ORDER — SUPREP BOWEL PREP KIT 17.5-3.13-1.6 GM/177ML PO SOLN: 1.0000 | Freq: Once | ORAL | 0 refills | Status: AC

## 2018-02-11 NOTE — Telephone Encounter (Signed)
Patient suprep 

## 2018-02-11 NOTE — Telephone Encounter (Signed)
TCS sch'd 03/03/18, left detailed message for patient, instructions mailed

## 2018-02-13 ENCOUNTER — Ambulatory Visit (HOSPITAL_COMMUNITY)
Admission: RE | Admit: 2018-02-13 | Discharge: 2018-02-13 | Disposition: A | Discharge status: Home / Self Care | Payer: Medicare Other | Source: Ambulatory Visit | Attending: Internal Medicine | Admitting: Internal Medicine

## 2018-02-13 ENCOUNTER — Other Ambulatory Visit: Payer: Self-pay

## 2018-02-13 ENCOUNTER — Encounter (HOSPITAL_COMMUNITY)
Admission: RE | Disposition: A | Discharge status: Home / Self Care | Payer: Self-pay | Source: Ambulatory Visit | Attending: Internal Medicine

## 2018-02-13 ENCOUNTER — Encounter (HOSPITAL_COMMUNITY): Payer: Self-pay

## 2018-02-13 DIAGNOSIS — K5730 Diverticulosis of large intestine without perforation or abscess without bleeding: Secondary | ICD-10-CM | POA: Diagnosis not present

## 2018-02-13 DIAGNOSIS — Z880 Allergy status to penicillin: Secondary | ICD-10-CM | POA: Insufficient documentation

## 2018-02-13 DIAGNOSIS — R935 Abnormal findings on diagnostic imaging of other abdominal regions, including retroperitoneum: Secondary | ICD-10-CM | POA: Diagnosis not present

## 2018-02-13 DIAGNOSIS — Z951 Presence of aortocoronary bypass graft: Secondary | ICD-10-CM | POA: Insufficient documentation

## 2018-02-13 DIAGNOSIS — I10 Essential (primary) hypertension: Secondary | ICD-10-CM | POA: Diagnosis not present

## 2018-02-13 DIAGNOSIS — Z7902 Long term (current) use of antithrombotics/antiplatelets: Secondary | ICD-10-CM | POA: Insufficient documentation

## 2018-02-13 DIAGNOSIS — E7800 Pure hypercholesterolemia, unspecified: Secondary | ICD-10-CM | POA: Insufficient documentation

## 2018-02-13 DIAGNOSIS — M797 Fibromyalgia: Secondary | ICD-10-CM | POA: Diagnosis not present

## 2018-02-13 DIAGNOSIS — E1151 Type 2 diabetes mellitus with diabetic peripheral angiopathy without gangrene: Secondary | ICD-10-CM | POA: Insufficient documentation

## 2018-02-13 DIAGNOSIS — Z79899 Other long term (current) drug therapy: Secondary | ICD-10-CM | POA: Diagnosis not present

## 2018-02-13 DIAGNOSIS — K644 Residual hemorrhoidal skin tags: Secondary | ICD-10-CM | POA: Insufficient documentation

## 2018-02-13 DIAGNOSIS — Z87891 Personal history of nicotine dependence: Secondary | ICD-10-CM | POA: Diagnosis not present

## 2018-02-13 DIAGNOSIS — X58XXXA Exposure to other specified factors, initial encounter: Secondary | ICD-10-CM | POA: Insufficient documentation

## 2018-02-13 DIAGNOSIS — Z8673 Personal history of transient ischemic attack (TIA), and cerebral infarction without residual deficits: Secondary | ICD-10-CM | POA: Insufficient documentation

## 2018-02-13 DIAGNOSIS — G4730 Sleep apnea, unspecified: Secondary | ICD-10-CM | POA: Diagnosis not present

## 2018-02-13 DIAGNOSIS — T184XXA Foreign body in colon, initial encounter: Secondary | ICD-10-CM | POA: Insufficient documentation

## 2018-02-13 DIAGNOSIS — K219 Gastro-esophageal reflux disease without esophagitis: Secondary | ICD-10-CM | POA: Diagnosis not present

## 2018-02-13 DIAGNOSIS — Z885 Allergy status to narcotic agent status: Secondary | ICD-10-CM | POA: Insufficient documentation

## 2018-02-13 DIAGNOSIS — Y929 Unspecified place or not applicable: Secondary | ICD-10-CM | POA: Diagnosis not present

## 2018-02-13 DIAGNOSIS — Z7984 Long term (current) use of oral hypoglycemic drugs: Secondary | ICD-10-CM | POA: Diagnosis not present

## 2018-02-13 HISTORY — PX: COLONOSCOPY: SHX5424

## 2018-02-13 HISTORY — PX: FOREIGN BODY REMOVAL: SHX962

## 2018-02-13 LAB — GLUCOSE, CAPILLARY: Glucose-Capillary: 143 mg/dL — ABNORMAL HIGH (ref 70–99)

## 2018-02-13 SURGERY — COLONOSCOPY
Anesthesia: Moderate Sedation | Site: Abdomen

## 2018-02-13 MED ORDER — BENEFIBER DRINK MIX PO PACK: 4.0000 g | PACK | Freq: Every day | ORAL | Status: DC

## 2018-02-13 MED ORDER — MEPERIDINE HCL 50 MG/ML IJ SOLN
INTRAMUSCULAR | Status: AC
  Filled 2018-02-13: qty 1

## 2018-02-13 MED ORDER — MIDAZOLAM HCL 5 MG/5ML IJ SOLN
INTRAMUSCULAR | Status: AC
  Filled 2018-02-13: qty 10

## 2018-02-13 MED ORDER — STERILE WATER FOR IRRIGATION IR SOLN
Status: DC | PRN
  Administered 2018-02-13: 15:00:00

## 2018-02-13 MED ORDER — MEPERIDINE HCL 50 MG/ML IJ SOLN
INTRAMUSCULAR | Status: DC | PRN
  Administered 2018-02-13 (×2): 25 mg

## 2018-02-13 MED ORDER — MIDAZOLAM HCL 5 MG/5ML IJ SOLN
INTRAMUSCULAR | Status: DC | PRN
  Administered 2018-02-13: 2 mg via INTRAVENOUS
  Administered 2018-02-13: 1 mg via INTRAVENOUS
  Administered 2018-02-13: 2 mg via INTRAVENOUS
  Administered 2018-02-13 (×2): 1 mg via INTRAVENOUS
  Administered 2018-02-13: 2 mg via INTRAVENOUS

## 2018-02-13 MED ORDER — DOCUSATE SODIUM 100 MG PO CAPS: 200.0000 mg | ORAL_CAPSULE | Freq: Every day | ORAL | 0 refills | Status: DC

## 2018-02-13 MED ORDER — SODIUM CHLORIDE 0.9 % IV SOLN
INTRAVENOUS | Status: DC
  Administered 2018-02-13: 14:00:00 via INTRAVENOUS

## 2018-02-13 NOTE — Discharge Instructions (Signed)
Resume usual medications including Plavix/clopidogrel as before. Colace 200 mg by mouth every night(stool softener). Benefiber or equivalent 4 g by mouth daily at bedtime. High-fiber diet. No driving for 24 hours.      Colonoscopy, Adult, Care After This sheet gives you information about how to care for yourself after your procedure. Your doctor may also give you more specific instructions. If you have problems or questions, call your doctor. Follow these instructions at home: General instructions   For the first 24 hours after the procedure: ? Do not drive or use machinery. ? Do not sign important documents. ? Do not drink alcohol. ? Do your daily activities more slowly than normal. ? Eat foods that are soft and easy to digest. ? Rest often.  Take over-the-counter or prescription medicines only as told by your doctor.  It is up to you to get the results of your procedure. Ask your doctor, or the department performing the procedure, when your results will be ready. To help cramping and bloating:  Try walking around.  Put heat on your belly (abdomen) as told by your doctor. Use a heat source that your doctor recommends, such as a moist heat pack or a heating pad. ? Put a towel between your skin and the heat source. ? Leave the heat on for 20-30 minutes. ? Remove the heat if your skin turns bright red. This is especially important if you cannot feel pain, heat, or cold. You can get burned. Eating and drinking  Drink enough fluid to keep your pee (urine) clear or pale yellow.  Return to your normal diet as told by your doctor. Avoid heavy or fried foods that are hard to digest.  Avoid drinking alcohol for as long as told by your doctor. Contact a doctor if:  You have blood in your poop (stool) 2-3 days after the procedure. Get help right away if:  You have more than a small amount of blood in your poop.  You see large clumps of tissue (blood clots) in your poop.  Your  belly is swollen.  You feel sick to your stomach (nauseous).  You throw up (vomit).  You have a fever.  You have belly pain that gets worse, and medicine does not help your pain. This information is not intended to replace advice given to you by your health care provider. Make sure you discuss any questions you have with your health care provider. Document Released: 06/30/2010 Document Revised: 02/20/2016 Document Reviewed: 02/20/2016 Elsevier Interactive Patient Education  2017 Elsevier Inc.     Diverticulosis Diverticulosis is a condition that develops when small pouches (diverticula) form in the wall of the large intestine (colon). The colon is where water is absorbed and stool is formed. The pouches form when the inside layer of the colon pushes through weak spots in the outer layers of the colon. You may have a few pouches or many of them. What are the causes? The cause of this condition is not known. What increases the risk? The following factors may make you more likely to develop this condition:  Being older than age 37. Your risk for this condition increases with age. Diverticulosis is rare among people younger than age 59. By age 32, many people have it.  Eating a low-fiber diet.  Having frequent constipation.  Being overweight.  Not getting enough exercise.  Smoking.  Taking over-the-counter pain medicines, like aspirin and ibuprofen.  Having a family history of diverticulosis.  What are the signs or  symptoms? In most people, there are no symptoms of this condition. If you do have symptoms, they may include:  Bloating.  Cramps in the abdomen.  Constipation or diarrhea.  Pain in the lower left side of the abdomen.  How is this diagnosed? This condition is most often diagnosed during an exam for other colon problems. Because diverticulosis usually has no symptoms, it often cannot be diagnosed independently. This condition may be diagnosed by:  Using a  flexible scope to examine the colon (colonoscopy).  Taking an X-ray of the colon after dye has been put into the colon (barium enema).  Doing a CT scan.  How is this treated? You may not need treatment for this condition if you have never developed an infection related to diverticulosis. If you have had an infection before, treatment may include:  Eating a high-fiber diet. This may include eating more fruits, vegetables, and grains.  Taking a fiber supplement.  Taking a live bacteria supplement (probiotic).  Taking medicine to relax your colon.  Taking antibiotic medicines.  Follow these instructions at home:  Drink 6-8 glasses of water or more each day to prevent constipation.  Try not to strain when you have a bowel movement.  If you have had an infection before: ? Eat more fiber as directed by your health care provider or your diet and nutrition specialist (dietitian). ? Take a fiber supplement or probiotic, if your health care provider approves.  Take over-the-counter and prescription medicines only as told by your health care provider.  If you were prescribed an antibiotic, take it as told by your health care provider. Do not stop taking the antibiotic even if you start to feel better.  Keep all follow-up visits as told by your health care provider. This is important. Contact a health care provider if:  You have pain in your abdomen.  You have bloating.  You have cramps.  You have not had a bowel movement in 3 days. Get help right away if:  Your pain gets worse.  Your bloating becomes very bad.  You have a fever or chills, and your symptoms suddenly get worse.  You vomit.  You have bowel movements that are bloody or black.  You have bleeding from your rectum. Summary  Diverticulosis is a condition that develops when small pouches (diverticula) form in the wall of the large intestine (colon).  You may have a few pouches or many of them.  This  condition is most often diagnosed during an exam for other colon problems.  If you have had an infection related to diverticulosis, treatment may include increasing the fiber in your diet, taking supplements, or taking medicines. This information is not intended to replace advice given to you by your health care provider. Make sure you discuss any questions you have with your health care provider. Document Released: 02/23/2004 Document Revised: 04/16/2016 Document Reviewed: 04/16/2016 Elsevier Interactive Patient Education  2017 [place].    High-Fiber Diet Fiber, also called dietary fiber, is a type of carbohydrate found in fruits, vegetables, whole grains, and beans. A high-fiber diet can have many health benefits. Your health care provider may recommend a high-fiber diet to help:  Prevent constipation. Fiber can make your bowel movements more regular.  Lower your cholesterol.  Relieve hemorrhoids, uncomplicated diverticulosis, or irritable bowel syndrome.  Prevent overeating as part of a weight-loss plan.  Prevent heart disease, type 2 diabetes, and certain cancers.  What is my plan? The recommended daily intake of fiber  includes:  38 grams for men under age 59.  65 grams for men over age 34.  82 grams for women under age 14.  64 grams for women over age 28.  You can get the recommended daily intake of dietary fiber by eating a variety of fruits, vegetables, grains, and beans. Your health care provider may also recommend a fiber supplement if it is not possible to get enough fiber through your diet. What do I need to know about a high-fiber diet?  Fiber supplements have not been widely studied for their effectiveness, so it is better to get fiber through food sources.  Always check the fiber content on thenutrition facts label of any prepackaged food. Look for foods that contain at least 5 grams of fiber per serving.  Ask your dietitian if you have questions about  specific foods that are related to your condition, especially if those foods are not listed in the following section.  Increase your daily fiber consumption gradually. Increasing your intake of dietary fiber too quickly may cause bloating, cramping, or gas.  Drink plenty of water. Water helps you to digest fiber. What foods can I eat? Grains Whole-grain breads. Multigrain cereal. Oats and oatmeal. Brown rice. Barley. Bulgur wheat. [place]. Bran muffins. Popcorn. Rye wafer crackers. Vegetables Sweet potatoes. Spinach. Kale. Artichokes. Cabbage. Broccoli. Green peas. Carrots. Squash. Fruits Berries. Pears. Apples. Oranges. Avocados. Prunes and raisins. Dried figs. Meats and Other Protein Sources [place], kidney, pinto, and soy beans. Split peas. Lentils. Nuts and seeds. Dairy Fiber-fortified yogurt. Beverages Fiber-fortified soy milk. Fiber-fortified orange juice. Other Fiber bars. The items listed above may not be a complete list of recommended foods or beverages. Contact your dietitian for more options. What foods are not recommended? Grains White bread. Pasta made with refined flour. White rice. Vegetables Fried potatoes. Canned vegetables. Well-cooked vegetables. Fruits Fruit juice. Cooked, strained fruit. Meats and Other Protein Sources Fatty cuts of meat. Fried Sales executive or fried fish. Dairy Milk. Yogurt. Cream cheese. Sour cream. Beverages Soft drinks. Other Cakes and pastries. Butter and oils. The items listed above may not be a complete list of foods and beverages to avoid. Contact your dietitian for more information. What are some tips for including high-fiber foods in my diet?  Eat a wide variety of high-fiber foods.  Make sure that half of all grains consumed each day are whole grains.  Replace breads and cereals made from refined flour or white flour with whole-grain breads and cereals.  Replace white rice with brown rice, bulgur wheat, or millet.  Start the day  with a breakfast that is high in fiber, such as a cereal that contains at least 5 grams of fiber per serving.  Use beans in place of meat in soups, salads, or pasta.  Eat high-fiber snacks, such as berries, raw vegetables, nuts, or popcorn. This information is not intended to replace advice given to you by your health care provider. Make sure you discuss any questions you have with your health care provider. Document Released: 05/28/2005 Document Revised: 11/03/2015 Document Reviewed: 11/10/2013 Elsevier Interactive Patient Education  [employer].

## 2018-02-13 NOTE — Op Note (Addendum)
[facility] Patient Name: [name] Procedure Date: 02/13/2018 2:36 PM MRN: [id number] Date of Birth: [birth year] Attending MD: Hildred Laser , MD CSN: [id number] Age: 72 Admit Type: Outpatient Procedure:                Colonoscopy Indications:              Abnormal CT of the GI tract Providers:                Hildred Laser, MD, Otis Peak B. Sharon Seller, RN, Aram Candela Referring MD:             Pablo Lawrence, PA Medicines:                Meperidine 50 mg IV, Midazolam 9 mg IV Complications:            No immediate complications. Estimated Blood Loss:     Estimated blood loss: none. Procedure:                Pre-Anesthesia Assessment:                           - Prior to the procedure, a History and Physical                            was performed, and patient medications and                            allergies were reviewed. The patient's tolerance of                            previous anesthesia was also reviewed. The risks                            and benefits of the procedure and the sedation                            options and risks were discussed with the patient.                            All questions were answered, and informed consent                            was obtained. Prior Anticoagulants: The patient                            last took Plavix (clopidogrel) 5 days prior to the                            procedure. ASA Grade Assessment: III - A patient                            with severe systemic disease. After reviewing the  risks and benefits, the patient was deemed in                            satisfactory condition to undergo the procedure.                           After obtaining informed consent, the colonoscope                            was passed under direct vision. Throughout the                            procedure, the patient's blood pressure, pulse, and   oxygen saturations were monitored continuously. The                            [id number]-[id number] ([id number]) was introduced through the                            anus and advanced to the the cecum, identified by                            appendiceal orifice and ileocecal valve. The                            colonoscopy was somewhat difficult due to multiple                            diverticula in the colon. The patient tolerated the                            procedure well. The quality of the bowel                            preparation was adequate. The ileocecal valve,                            appendiceal orifice, and rectum were photographed. Scope In: 3:08:22 PM Scope Out: 3:31:16 PM Scope Withdrawal Time: 0 hours 6 minutes 55 seconds  Total Procedure Duration: 0 hours 22 minutes 54 seconds  Findings:      The perianal and digital rectal examinations were normal.      The hepatic flexure, ascending colon, cecum, appendiceal orifice and       ileocecal valve appeared normal.      Multiple small and large-mouthed diverticula were found in the sigmoid       colon.      A few medium-mouthed diverticula were found in the descending colon,       splenic flexure and transverse colon. Slender foreign body removed from       sigmoid colon.      External hemorrhoids were found during retroflexion. The hemorrhoids       were small. Impression:               - The hepatic flexure, ascending colon, cecum,  appendiceal orifice and ileocecal valve are normal.                           - Diverticulosis in the sigmoid colon.                           - Diverticulosis in the descending colon, at the                            splenic flexure and in the transverse colon.                           - External hemorrhoids.                           - Small foreign body (fruit stem removed from                            sigmoid colon. Moderate Sedation:      Moderate  (conscious) sedation was administered by the endoscopy nurse       and supervised by the endoscopist. The following parameters were       monitored: oxygen saturation, heart rate, blood pressure, CO2       capnography and response to care. Total physician intraservice time was       29 minutes. Recommendation:           - Patient has a contact number available for                            emergencies. The signs and symptoms of potential                            delayed complications were discussed with the                            patient. Return to normal activities tomorrow.                            Written discharge instructions were provided to the                            patient.                           - High fiber diet and diabetic (ADA) diet today.                           - Continue present medications.                           - Resume Plavix (clopidogrel) at prior dose today.                           - No repeat colonoscopy due to age and the absence  of advanced adenomas. Procedure Code(s):        --- Professional ---                           [phone], Colonoscopy, flexible; diagnostic, including                            collection of specimen(s) by brushing or washing,                            when performed (separate procedure)                           G0500, Moderate sedation services provided by the                            same physician or other qualified health care                            professional performing a gastrointestinal                            endoscopic service that sedation supports,                            requiring the presence of an independent trained                            observer to assist in the monitoring of the                            patient's level of consciousness and physiological                            status; initial 15 minutes of intra-service time;                             patient age 82 years or older (additional time may                            be reported with [phone], as appropriate)                           [phone], Moderate sedation services provided by the                            same physician or other qualified health care                            professional performing the diagnostic or                            therapeutic service that the sedation supports,  requiring the presence of an independent trained                            observer to assist in the monitoring of the                            patient's level of consciousness and physiological                            status; each additional 15 minutes intraservice                            time (List separately in addition to code for                            primary service) Diagnosis Code(s):        --- Professional ---                           K64.4, Residual hemorrhoidal skin tags                           K57.30, Diverticulosis of large intestine without                            perforation or abscess without bleeding                           R93.3, Abnormal findings on diagnostic imaging of                            other parts of digestive tract CPT copyright 2017 American [employer]. All rights reserved. The codes documented in this report are preliminary and upon coder review may  be revised to meet current compliance requirements. Hildred Laser, MD Hildred Laser, MD 02/13/2018 4:04:54 PM This report has been signed electronically. Number of Addenda: 0

## 2018-02-13 NOTE — H&P (Signed)
[name] is an 72 y.o. female.   Chief Complaint: Patient is here for colonoscopy. HPI: Patient is 72 year old Caucasian female underwent abdominal pelvic CT recently for abdominal pain.  She was noted to have colonic wall thickening in the region of ascending colon and as well as sigmoid colon.  She is also found to have extensive sigmoid colon diverticulosis.  There is no history of rectal bleeding.  She has never been screened for CRC. Last Plavix dose was 5 days ago. History is negative for CRC.  Past Medical History:  Diagnosis Date  . Anginal pain [facility])    sees Dr Einar Gip.   . Arthritis    rheumatoid ..   . Diabetes mellitus without complication (HCC)    Metformin 2.3.2017  . Diverticulitis   . Dysrhythmia   . Fibromyalgia   . GERD (gastroesophageal reflux disease)   . High cholesterol   . Hypertension   . Liver disease 08- 2012   per Dr Dagmar Hait pt has enlarged liver  . Peripheral vascular disease (HCC)    legs  . Pneumonia 06/17/2016  . Sleep apnea    sleep study  oct 2012  . Stroke [facility]) 2011   [facility]      Past Surgical History:  Procedure Laterality Date  . [place]  . CHOLECYSTECTOMY  1996  . CORONARY ARTERY BYPASS GRAFT  1986  . ESOPHAGEAL DILATION N/A 01/13/2018   Procedure: ESOPHAGEAL DILATION;  Surgeon: Rogene Houston, MD;  [facility];  Service: Endoscopy;  Laterality: N/A;  . ESOPHAGOGASTRODUODENOSCOPY N/A 01/13/2018   Procedure: ESOPHAGOGASTRODUODENOSCOPY (EGD);  Surgeon: Rogene Houston, MD;  [facility];  Service: Endoscopy;  Laterality: N/A;  . EYE SURGERY  2013   cat ext bilateral .  . EYE SURGERY  2002   laser  . [place]- 2007   rod right leg - right wrist plate  . GAS/FLUID EXCHANGE  03/25/2012   Procedure: GAS/FLUID EXCHANGE;  Surgeon: Hayden Pedro, MD;  Location: [place];  Service: Ophthalmology;  Laterality: Right;  . PARS PLANA VITRECTOMY  03/25/2012   Procedure:  PARS PLANA VITRECTOMY WITH 25 GAUGE;  Surgeon: Hayden Pedro, MD;  Location: [place];  Service: Ophthalmology;  Laterality: Right;    Family History  Problem Relation Age of Onset  . Hypertension Daughter   . Rheum arthritis Maternal Grandmother    Social History:  reports that she quit smoking about 33 years ago. Her smoking use included cigarettes. She has a 1.25 pack-year smoking history. She has never used smokeless tobacco. She reports that she drinks alcohol. She reports that she does not use drugs.  Allergies:  Allergies  Allergen Reactions  . Fentanyl Anaphylaxis and Shortness Of Breath  . Lactose Intolerance (Gi) Other (See Comments)    G.I. Upset  . Butrans [Buprenorphine] Rash and Other (See Comments)    Infected skin underneath application  . Penicillins Rash    Facial rash Has patient had a PCN reaction causing immediate rash, facial/tongue/throat swelling, SOB or lightheadedness with hypotension: Yes Has patient had a PCN reaction causing severe rash involving mucus membranes or skin necrosis: No Has patient had a PCN reaction that required hospitalization No Has patient had a PCN reaction occurring within the last 10 years: Yes If all of the above answers are "NO", then may proceed with Cephalosporin use.   . Simvastatin Rash    Medications Prior to Admission  Medication  Sig Dispense Refill  . clopidogrel (PLAVIX) 75 MG tablet Take 1 tablet (75 mg total) by mouth daily with breakfast. 90 tablet 6  . diclofenac sodium (VOLTAREN) 1 % GEL Apply 2 g topically 4 (four) times daily as needed (Pain).     Marland Kitchen glipiZIDE (GLUCOTROL) 10 MG tablet Take 10 mg by mouth daily before breakfast.    . HYDROcodone-acetaminophen (NORCO) 10-325 MG tablet Take 1 tablet by mouth 4 (four) times daily.    . IRON PO Take 1 tablet by mouth daily.    . isosorbide dinitrate (ISORDIL) 5 MG tablet Take 1 tablet (5 mg total) by mouth 3 (three) times daily before meals. 90 tablet 1  . leflunomide  (ARAVA) 20 MG tablet Take 20 mg by mouth daily.    . metoprolol succinate (TOPROL-XL) 25 MG 24 hr tablet Take 25 mg by mouth daily.    . Olmesartan-amLODIPine-HCTZ (TRIBENZOR) 40-5-25 MG TABS Take 1 tablet by mouth daily.     . pantoprazole (PROTONIX) 40 MG tablet Take 40 mg by mouth daily.    . pravastatin (PRAVACHOL) 40 MG tablet Take 40 mg by mouth at bedtime.    . Tafluprost (ZIOPTAN) 0.0015 % SOLN Place 1 drop into both eyes at bedtime.     . temazepam (RESTORIL) 30 MG capsule Take 30 mg by mouth at bedtime.    . timolol (BETIMOL) 0.5 % ophthalmic solution Place 1 drop into both eyes 2 (two) times daily.     Marland Kitchen VITAMIN D, ERGOCALCIFEROL, PO Take 50,000 Units by mouth once a week. Thursdays    . Blood Glucose Monitoring Suppl (FREESTYLE LITE) DEVI See admin instructions.  0    Results for orders placed or performed during the hospital encounter of 02/13/18 (from the past 48 hour(s))  Glucose, capillary     Status: Abnormal   Collection Time: 02/13/18  1:40 PM  Result Value Ref Range   Glucose-Capillary 143 (H) 70 - 99 mg/dL   No results found.  ROS  Blood pressure (!) 109/53, pulse 84, temperature 97.7 F (36.5 C), temperature source Oral, resp. rate 15, height 5\' 5"  (1.651 m), weight 116.1 kg, SpO2 91 %. Physical Exam  Constitutional: She appears well-developed and well-nourished.  HENT:  Mouth/Throat: Oropharynx is clear and moist.  Eyes: Conjunctivae are normal. No scleral icterus.  Neck: No thyromegaly present.  Cardiovascular: Normal rate, regular rhythm and normal heart sounds.  No murmur heard. Respiratory: Effort normal and breath sounds normal.  Midsternal scar.  GI:  Abdomen is full but soft and nontender with organomegaly or masses.  Musculoskeletal: She exhibits no edema.  Lymphadenopathy:    She has no cervical adenopathy.  Neurological: She is alert.  Skin: Skin is warm and dry.     Assessment/Plan Abnormal abdominopelvic CT. Diagnostic  colonoscopy.  Hildred Laser, MD 02/13/2018, 2:55 PM

## 2018-02-13 NOTE — Progress Notes (Signed)
Foreign body which per Dr. Laural Golden appears as stem food debri shown to patient by Dr. Laural Golden

## 2018-02-18 ENCOUNTER — Encounter (HOSPITAL_COMMUNITY): Payer: Self-pay | Admitting: Internal Medicine

## 2018-02-25 ENCOUNTER — Other Ambulatory Visit (HOSPITAL_COMMUNITY)
Admission: RE | Admit: 2018-02-25 | Discharge: 2018-02-25 | Disposition: A | Discharge status: Home / Self Care | Payer: Medicare Other | Source: Other Acute Inpatient Hospital | Attending: Urology | Admitting: Urology

## 2018-02-25 ENCOUNTER — Ambulatory Visit (INDEPENDENT_AMBULATORY_CARE_PROVIDER_SITE_OTHER): Payer: Medicare Other | Admitting: Urology

## 2018-02-25 DIAGNOSIS — N3941 Urge incontinence: Secondary | ICD-10-CM | POA: Diagnosis not present

## 2018-02-25 DIAGNOSIS — R3915 Urgency of urination: Secondary | ICD-10-CM | POA: Diagnosis not present

## 2018-02-26 LAB — URINE CULTURE: Culture: <10000 — AB

## 2018-03-12 DIAGNOSIS — M79606 Pain in leg, unspecified: Secondary | ICD-10-CM | POA: Diagnosis not present

## 2018-03-12 DIAGNOSIS — M797 Fibromyalgia: Secondary | ICD-10-CM | POA: Diagnosis not present

## 2018-03-12 DIAGNOSIS — Z6838 Body mass index (BMI) 38.0-38.9, adult: Secondary | ICD-10-CM | POA: Diagnosis not present

## 2018-03-13 ENCOUNTER — Other Ambulatory Visit (HOSPITAL_COMMUNITY): Payer: Self-pay | Admitting: Adult Health Nurse Practitioner

## 2018-03-13 DIAGNOSIS — I7389 Other specified peripheral vascular diseases: Secondary | ICD-10-CM

## 2018-03-19 ENCOUNTER — Ambulatory Visit (HOSPITAL_COMMUNITY)
Admission: RE | Admit: 2018-03-19 | Discharge: 2018-03-19 | Disposition: A | Discharge status: Home / Self Care | Payer: Medicare Other | Source: Ambulatory Visit | Attending: Adult Health Nurse Practitioner | Admitting: Adult Health Nurse Practitioner

## 2018-03-19 DIAGNOSIS — I7389 Other specified peripheral vascular diseases: Secondary | ICD-10-CM | POA: Insufficient documentation

## 2018-03-19 DIAGNOSIS — M79661 Pain in right lower leg: Secondary | ICD-10-CM | POA: Diagnosis not present

## 2018-03-19 DIAGNOSIS — M79662 Pain in left lower leg: Secondary | ICD-10-CM | POA: Diagnosis not present

## 2018-03-25 DIAGNOSIS — M546 Pain in thoracic spine: Secondary | ICD-10-CM | POA: Diagnosis not present

## 2018-03-25 DIAGNOSIS — M7918 Myalgia, other site: Secondary | ICD-10-CM | POA: Diagnosis not present

## 2018-03-26 DIAGNOSIS — M150 Primary generalized (osteo)arthritis: Secondary | ICD-10-CM | POA: Diagnosis not present

## 2018-03-26 DIAGNOSIS — Z6841 Body Mass Index (BMI) 40.0 and over, adult: Secondary | ICD-10-CM | POA: Diagnosis not present

## 2018-03-26 DIAGNOSIS — M0589 Other rheumatoid arthritis with rheumatoid factor of multiple sites: Secondary | ICD-10-CM | POA: Diagnosis not present

## 2018-03-26 DIAGNOSIS — M79672 Pain in left foot: Secondary | ICD-10-CM | POA: Diagnosis not present

## 2018-03-26 DIAGNOSIS — L989 Disorder of the skin and subcutaneous tissue, unspecified: Secondary | ICD-10-CM | POA: Diagnosis not present

## 2018-03-26 DIAGNOSIS — M797 Fibromyalgia: Secondary | ICD-10-CM | POA: Diagnosis not present

## 2018-03-26 DIAGNOSIS — M67441 Ganglion, right hand: Secondary | ICD-10-CM | POA: Diagnosis not present

## 2018-03-26 DIAGNOSIS — M25561 Pain in right knee: Secondary | ICD-10-CM | POA: Diagnosis not present

## 2018-03-26 DIAGNOSIS — N183 Chronic kidney disease, stage 3 (moderate): Secondary | ICD-10-CM | POA: Diagnosis not present

## 2018-04-04 DIAGNOSIS — R142 Eructation: Secondary | ICD-10-CM | POA: Diagnosis not present

## 2018-04-04 DIAGNOSIS — E1169 Type 2 diabetes mellitus with other specified complication: Secondary | ICD-10-CM | POA: Diagnosis not present

## 2018-04-04 DIAGNOSIS — R110 Nausea: Secondary | ICD-10-CM | POA: Diagnosis not present

## 2018-04-04 DIAGNOSIS — M545 Low back pain: Secondary | ICD-10-CM | POA: Diagnosis not present

## 2018-04-04 DIAGNOSIS — N3281 Overactive bladder: Secondary | ICD-10-CM | POA: Diagnosis not present

## 2018-04-04 DIAGNOSIS — R1013 Epigastric pain: Secondary | ICD-10-CM | POA: Diagnosis not present

## 2018-04-04 DIAGNOSIS — R109 Unspecified abdominal pain: Secondary | ICD-10-CM | POA: Diagnosis not present

## 2018-04-04 DIAGNOSIS — R300 Dysuria: Secondary | ICD-10-CM | POA: Diagnosis not present

## 2018-04-04 DIAGNOSIS — G459 Transient cerebral ischemic attack, unspecified: Secondary | ICD-10-CM | POA: Diagnosis not present

## 2018-04-04 DIAGNOSIS — R1084 Generalized abdominal pain: Secondary | ICD-10-CM | POA: Diagnosis not present

## 2018-04-04 DIAGNOSIS — R350 Frequency of micturition: Secondary | ICD-10-CM | POA: Diagnosis not present

## 2018-04-04 DIAGNOSIS — G894 Chronic pain syndrome: Secondary | ICD-10-CM | POA: Diagnosis not present

## 2018-04-05 ENCOUNTER — Encounter (HOSPITAL_COMMUNITY): Payer: Self-pay | Admitting: Emergency Medicine

## 2018-04-05 ENCOUNTER — Other Ambulatory Visit: Payer: Self-pay

## 2018-04-05 ENCOUNTER — Emergency Department (HOSPITAL_COMMUNITY): Payer: Medicare Other

## 2018-04-05 ENCOUNTER — Inpatient Hospital Stay (HOSPITAL_COMMUNITY)
Admission: EM | Admit: 2018-04-05 | Discharge: 2018-05-13 | DRG: 853 | Disposition: A | Discharge status: Skilled Nursing Facility | Payer: Medicare Other | Attending: Internal Medicine | Admitting: Internal Medicine

## 2018-04-05 DIAGNOSIS — I10 Essential (primary) hypertension: Secondary | ICD-10-CM | POA: Diagnosis present

## 2018-04-05 DIAGNOSIS — G4733 Obstructive sleep apnea (adult) (pediatric): Secondary | ICD-10-CM | POA: Diagnosis present

## 2018-04-05 DIAGNOSIS — L0291 Cutaneous abscess, unspecified: Secondary | ICD-10-CM

## 2018-04-05 DIAGNOSIS — K651 Peritoneal abscess: Secondary | ICD-10-CM | POA: Diagnosis not present

## 2018-04-05 DIAGNOSIS — Z7984 Long term (current) use of oral hypoglycemic drugs: Secondary | ICD-10-CM

## 2018-04-05 DIAGNOSIS — E1169 Type 2 diabetes mellitus with other specified complication: Secondary | ICD-10-CM | POA: Diagnosis not present

## 2018-04-05 DIAGNOSIS — N183 Chronic kidney disease, stage 3 unspecified: Secondary | ICD-10-CM | POA: Diagnosis present

## 2018-04-05 DIAGNOSIS — Z23 Encounter for immunization: Secondary | ICD-10-CM | POA: Diagnosis not present

## 2018-04-05 DIAGNOSIS — R609 Edema, unspecified: Secondary | ICD-10-CM

## 2018-04-05 DIAGNOSIS — T82868A Thrombosis of vascular prosthetic devices, implants and grafts, initial encounter: Secondary | ICD-10-CM | POA: Diagnosis not present

## 2018-04-05 DIAGNOSIS — R52 Pain, unspecified: Secondary | ICD-10-CM | POA: Diagnosis not present

## 2018-04-05 DIAGNOSIS — E876 Hypokalemia: Secondary | ICD-10-CM | POA: Diagnosis not present

## 2018-04-05 DIAGNOSIS — I959 Hypotension, unspecified: Secondary | ICD-10-CM | POA: Diagnosis not present

## 2018-04-05 DIAGNOSIS — I129 Hypertensive chronic kidney disease with stage 1 through stage 4 chronic kidney disease, or unspecified chronic kidney disease: Secondary | ICD-10-CM | POA: Diagnosis present

## 2018-04-05 DIAGNOSIS — K5732 Diverticulitis of large intestine without perforation or abscess without bleeding: Secondary | ICD-10-CM | POA: Diagnosis not present

## 2018-04-05 DIAGNOSIS — Z6841 Body Mass Index (BMI) 40.0 and over, adult: Secondary | ICD-10-CM

## 2018-04-05 DIAGNOSIS — D649 Anemia, unspecified: Secondary | ICD-10-CM | POA: Diagnosis present

## 2018-04-05 DIAGNOSIS — Z79899 Other long term (current) drug therapy: Secondary | ICD-10-CM

## 2018-04-05 DIAGNOSIS — G4700 Insomnia, unspecified: Secondary | ICD-10-CM | POA: Diagnosis present

## 2018-04-05 DIAGNOSIS — K5792 Diverticulitis of intestine, part unspecified, without perforation or abscess without bleeding: Secondary | ICD-10-CM

## 2018-04-05 DIAGNOSIS — R197 Diarrhea, unspecified: Secondary | ICD-10-CM | POA: Diagnosis not present

## 2018-04-05 DIAGNOSIS — Z452 Encounter for adjustment and management of vascular access device: Secondary | ICD-10-CM

## 2018-04-05 DIAGNOSIS — Z7902 Long term (current) use of antithrombotics/antiplatelets: Secondary | ICD-10-CM

## 2018-04-05 DIAGNOSIS — Z8673 Personal history of transient ischemic attack (TIA), and cerebral infarction without residual deficits: Secondary | ICD-10-CM

## 2018-04-05 DIAGNOSIS — I82621 Acute embolism and thrombosis of deep veins of right upper extremity: Secondary | ICD-10-CM | POA: Diagnosis not present

## 2018-04-05 DIAGNOSIS — Z880 Allergy status to penicillin: Secondary | ICD-10-CM

## 2018-04-05 DIAGNOSIS — Z951 Presence of aortocoronary bypass graft: Secondary | ICD-10-CM

## 2018-04-05 DIAGNOSIS — E1151 Type 2 diabetes mellitus with diabetic peripheral angiopathy without gangrene: Secondary | ICD-10-CM | POA: Diagnosis present

## 2018-04-05 DIAGNOSIS — I2510 Atherosclerotic heart disease of native coronary artery without angina pectoris: Secondary | ICD-10-CM | POA: Diagnosis present

## 2018-04-05 DIAGNOSIS — E119 Type 2 diabetes mellitus without complications: Secondary | ICD-10-CM

## 2018-04-05 DIAGNOSIS — G4730 Sleep apnea, unspecified: Secondary | ICD-10-CM | POA: Diagnosis present

## 2018-04-05 DIAGNOSIS — K660 Peritoneal adhesions (postprocedural) (postinfection): Secondary | ICD-10-CM | POA: Diagnosis present

## 2018-04-05 DIAGNOSIS — R0600 Dyspnea, unspecified: Secondary | ICD-10-CM

## 2018-04-05 DIAGNOSIS — N179 Acute kidney failure, unspecified: Secondary | ICD-10-CM | POA: Diagnosis present

## 2018-04-05 DIAGNOSIS — H409 Unspecified glaucoma: Secondary | ICD-10-CM | POA: Diagnosis present

## 2018-04-05 DIAGNOSIS — Z888 Allergy status to other drugs, medicaments and biological substances status: Secondary | ICD-10-CM

## 2018-04-05 DIAGNOSIS — F329 Major depressive disorder, single episode, unspecified: Secondary | ICD-10-CM | POA: Diagnosis present

## 2018-04-05 DIAGNOSIS — E1122 Type 2 diabetes mellitus with diabetic chronic kidney disease: Secondary | ICD-10-CM | POA: Diagnosis present

## 2018-04-05 DIAGNOSIS — Z1611 Resistance to penicillins: Secondary | ICD-10-CM | POA: Diagnosis present

## 2018-04-05 DIAGNOSIS — D509 Iron deficiency anemia, unspecified: Secondary | ICD-10-CM | POA: Diagnosis present

## 2018-04-05 DIAGNOSIS — D6489 Other specified anemias: Secondary | ICD-10-CM | POA: Diagnosis present

## 2018-04-05 DIAGNOSIS — E875 Hyperkalemia: Secondary | ICD-10-CM | POA: Diagnosis not present

## 2018-04-05 DIAGNOSIS — E7800 Pure hypercholesterolemia, unspecified: Secondary | ICD-10-CM | POA: Diagnosis present

## 2018-04-05 DIAGNOSIS — D631 Anemia in chronic kidney disease: Secondary | ICD-10-CM | POA: Diagnosis present

## 2018-04-05 DIAGNOSIS — K5720 Diverticulitis of large intestine with perforation and abscess without bleeding: Secondary | ICD-10-CM

## 2018-04-05 DIAGNOSIS — G894 Chronic pain syndrome: Secondary | ICD-10-CM | POA: Diagnosis present

## 2018-04-05 DIAGNOSIS — A419 Sepsis, unspecified organism: Secondary | ICD-10-CM | POA: Diagnosis not present

## 2018-04-05 DIAGNOSIS — Z87891 Personal history of nicotine dependence: Secondary | ICD-10-CM

## 2018-04-05 DIAGNOSIS — Y832 Surgical operation with anastomosis, bypass or graft as the cause of abnormal reaction of the patient, or of later complication, without mention of misadventure at the time of the procedure: Secondary | ICD-10-CM | POA: Diagnosis not present

## 2018-04-05 DIAGNOSIS — K219 Gastro-esophageal reflux disease without esophagitis: Secondary | ICD-10-CM | POA: Diagnosis present

## 2018-04-05 DIAGNOSIS — M069 Rheumatoid arthritis, unspecified: Secondary | ICD-10-CM | POA: Diagnosis present

## 2018-04-05 DIAGNOSIS — K631 Perforation of intestine (nontraumatic): Secondary | ICD-10-CM | POA: Diagnosis present

## 2018-04-05 DIAGNOSIS — M797 Fibromyalgia: Secondary | ICD-10-CM | POA: Diagnosis present

## 2018-04-05 DIAGNOSIS — E11649 Type 2 diabetes mellitus with hypoglycemia without coma: Secondary | ICD-10-CM | POA: Diagnosis not present

## 2018-04-05 DIAGNOSIS — I471 Supraventricular tachycardia: Secondary | ICD-10-CM | POA: Diagnosis present

## 2018-04-05 DIAGNOSIS — N289 Disorder of kidney and ureter, unspecified: Secondary | ICD-10-CM

## 2018-04-05 DIAGNOSIS — E8770 Fluid overload, unspecified: Secondary | ICD-10-CM | POA: Diagnosis not present

## 2018-04-05 DIAGNOSIS — R5381 Other malaise: Secondary | ICD-10-CM | POA: Diagnosis not present

## 2018-04-05 HISTORY — DX: Diverticulitis of large intestine with perforation and abscess without bleeding: K57.20

## 2018-04-05 LAB — CBC WITH DIFFERENTIAL/PLATELET
Abs Immature Granulocytes: 0.05 10*3/uL (ref 0.00–0.07)
BASOS PCT: 0 %
Basophils Absolute: 0.1 10*3/uL (ref 0.0–0.1)
EOS ABS: 0.1 10*3/uL (ref 0.0–0.5)
EOS PCT: 1 %
HEMATOCRIT: 31.9 % — AB (ref 36.0–46.0)
Hemoglobin: 10 g/dL — ABNORMAL LOW (ref 12.0–15.0)
Immature Granulocytes: 0 %
LYMPHS ABS: 0.5 10*3/uL — AB (ref 0.7–4.0)
Lymphocytes Relative: 3 %
MCH: 30.6 pg (ref 26.0–34.0)
MCHC: 31.3 g/dL (ref 30.0–36.0)
MCV: 97.6 fL (ref 80.0–100.0)
Monocytes Absolute: 0.5 10*3/uL (ref 0.1–1.0)
Monocytes Relative: 4 %
NRBC: 0 % (ref 0.0–0.2)
Neutro Abs: 12.7 10*3/uL — ABNORMAL HIGH (ref 1.7–7.7)
Neutrophils Relative %: 92 %
PLATELETS: 353 10*3/uL (ref 150–400)
RBC: 3.27 MIL/uL — ABNORMAL LOW (ref 3.87–5.11)
RDW: 16.8 % — AB (ref 11.5–15.5)
WBC: 13.8 10*3/uL — ABNORMAL HIGH (ref 4.0–10.5)

## 2018-04-05 LAB — I-STAT CHEM 8, ED
BUN: 31 mg/dL — AB (ref 8–23)
CALCIUM ION: 0.72 mmol/L — AB (ref 1.15–1.40)
CREATININE: 1.5 mg/dL — AB (ref 0.44–1.00)
Chloride: 103 mmol/L (ref 98–111)
GLUCOSE: 101 mg/dL — AB (ref 70–99)
HCT: 32 % — ABNORMAL LOW (ref 36.0–46.0)
Hemoglobin: 10.9 g/dL — ABNORMAL LOW (ref 12.0–15.0)
Potassium: 3.8 mmol/L (ref 3.5–5.1)
Sodium: 137 mmol/L (ref 135–145)
TCO2: 25 mmol/L (ref 22–32)

## 2018-04-05 LAB — GLUCOSE, CAPILLARY: GLUCOSE-CAPILLARY: 74 mg/dL (ref 70–99)

## 2018-04-05 LAB — URINALYSIS, ROUTINE W REFLEX MICROSCOPIC
BILIRUBIN URINE: NEGATIVE
GLUCOSE, UA: NEGATIVE mg/dL
Hgb urine dipstick: NEGATIVE
KETONES UR: NEGATIVE mg/dL
Leukocytes, UA: NEGATIVE
Nitrite: NEGATIVE
PH: 5 (ref 5.0–8.0)
PROTEIN: NEGATIVE mg/dL
Specific Gravity, Urine: 1.021 (ref 1.005–1.030)

## 2018-04-05 LAB — COMPREHENSIVE METABOLIC PANEL
ALK PHOS: 59 U/L (ref 38–126)
ALT: 43 U/L (ref 0–44)
AST: 75 U/L — AB (ref 15–41)
Albumin: 3.3 g/dL — ABNORMAL LOW (ref 3.5–5.0)
Anion gap: 8 (ref 5–15)
BUN: 33 mg/dL — ABNORMAL HIGH (ref 8–23)
CALCIUM: 8.7 mg/dL — AB (ref 8.9–10.3)
CO2: 26 mmol/L (ref 22–32)
CREATININE: 1.43 mg/dL — AB (ref 0.44–1.00)
Chloride: 102 mmol/L (ref 98–111)
GFR calc Af Amer: 41 mL/min — ABNORMAL LOW (ref >60)
GFR calc non Af Amer: 35 mL/min — ABNORMAL LOW (ref >60)
Glucose, Bld: 102 mg/dL — ABNORMAL HIGH (ref 70–99)
Potassium: 3.7 mmol/L (ref 3.5–5.1)
SODIUM: 136 mmol/L (ref 135–145)
TOTAL PROTEIN: 6.9 g/dL (ref 6.5–8.1)
Total Bilirubin: 0.7 mg/dL (ref 0.3–1.2)

## 2018-04-05 LAB — I-STAT CG4 LACTIC ACID, ED: Lactic Acid, Venous: 1.06 mmol/L (ref 0.5–1.9)

## 2018-04-05 MED ORDER — HYDROMORPHONE HCL 1 MG/ML IJ SOLN
1.0000 mg | Freq: Once | INTRAMUSCULAR | Status: AC
  Administered 2018-04-05: 1 mg via INTRAVENOUS
  Filled 2018-04-05: qty 1

## 2018-04-05 MED ORDER — PANTOPRAZOLE SODIUM 40 MG PO TBEC
40.0000 mg | DELAYED_RELEASE_TABLET | Freq: Every day | ORAL | Status: DC
  Administered 2018-04-06 – 2018-04-29 (×23): 40 mg via ORAL
  Filled 2018-04-05 (×24): qty 1

## 2018-04-05 MED ORDER — IOPAMIDOL (ISOVUE-300) INJECTION 61%
80.0000 mL | Freq: Once | INTRAVENOUS | Status: AC | PRN
  Administered 2018-04-05: 80 mL via INTRAVENOUS

## 2018-04-05 MED ORDER — ONDANSETRON HCL 4 MG/2ML IJ SOLN
4.0000 mg | Freq: Four times a day (QID) | INTRAMUSCULAR | Status: DC | PRN
  Administered 2018-04-06 – 2018-04-29 (×6): 4 mg via INTRAVENOUS
  Filled 2018-04-05 (×6): qty 2

## 2018-04-05 MED ORDER — INSULIN ASPART 100 UNIT/ML ~~LOC~~ SOLN: 0.0000 [IU] | Freq: Three times a day (TID) | SUBCUTANEOUS | Status: DC

## 2018-04-05 MED ORDER — LATANOPROST 0.005 % OP SOLN
1.0000 [drp] | Freq: Every day | OPHTHALMIC | Status: DC
  Administered 2018-04-06 – 2018-05-12 (×33): 1 [drp] via OPHTHALMIC
  Filled 2018-04-05 (×3): qty 2.5

## 2018-04-05 MED ORDER — ONDANSETRON HCL 4 MG/2ML IJ SOLN
4.0000 mg | Freq: Once | INTRAMUSCULAR | Status: AC
  Administered 2018-04-05: 4 mg via INTRAVENOUS
  Filled 2018-04-05: qty 2

## 2018-04-05 MED ORDER — LEFLUNOMIDE 20 MG PO TABS
20.0000 mg | ORAL_TABLET | Freq: Every day | ORAL | Status: DC
Start: 2018-04-06 — End: 2018-04-09
  Administered 2018-04-06: 20 mg via ORAL
  Filled 2018-04-05 (×6): qty 1

## 2018-04-05 MED ORDER — ISOSORBIDE DINITRATE 5 MG PO TABS
5.0000 mg | ORAL_TABLET | Freq: Three times a day (TID) | ORAL | Status: DC
  Filled 2018-04-05 (×5): qty 1

## 2018-04-05 MED ORDER — CIPROFLOXACIN IN D5W 400 MG/200ML IV SOLN
400.0000 mg | Freq: Once | INTRAVENOUS | Status: AC
  Administered 2018-04-05: 400 mg via INTRAVENOUS
  Filled 2018-04-05: qty 200

## 2018-04-05 MED ORDER — ACETAMINOPHEN 325 MG PO TABS
650.0000 mg | ORAL_TABLET | Freq: Four times a day (QID) | ORAL | Status: DC | PRN
  Administered 2018-04-06 – 2018-04-19 (×9): 650 mg via ORAL
  Filled 2018-04-05 (×10): qty 2

## 2018-04-05 MED ORDER — SODIUM CHLORIDE 0.9 % IV SOLN
INTRAVENOUS | Status: DC
  Administered 2018-04-05: via INTRAVENOUS

## 2018-04-05 MED ORDER — METOPROLOL SUCCINATE ER 25 MG PO TB24
25.0000 mg | ORAL_TABLET | Freq: Every day | ORAL | Status: DC
  Administered 2018-04-06: 25 mg via ORAL
  Filled 2018-04-05: qty 1

## 2018-04-05 MED ORDER — METRONIDAZOLE IN NACL 5-0.79 MG/ML-% IV SOLN
500.0000 mg | Freq: Once | INTRAVENOUS | Status: AC
  Administered 2018-04-05: 500 mg via INTRAVENOUS
  Filled 2018-04-05: qty 100

## 2018-04-05 MED ORDER — PRAVASTATIN SODIUM 40 MG PO TABS
40.0000 mg | ORAL_TABLET | Freq: Every day | ORAL | Status: DC
  Administered 2018-04-06 – 2018-04-29 (×24): 40 mg via ORAL
  Filled 2018-04-05 (×24): qty 1

## 2018-04-05 MED ORDER — TIMOLOL HEMIHYDRATE 0.5 % OP SOLN
1.0000 [drp] | Freq: Two times a day (BID) | OPHTHALMIC | Status: DC
  Filled 2018-04-05: qty 5

## 2018-04-05 MED ORDER — METRONIDAZOLE IN NACL 5-0.79 MG/ML-% IV SOLN
500.0000 mg | Freq: Three times a day (TID) | INTRAVENOUS | Status: DC
  Administered 2018-04-06: 500 mg via INTRAVENOUS
  Filled 2018-04-05: qty 100

## 2018-04-05 MED ORDER — ACETAMINOPHEN 650 MG RE SUPP: 650.0000 mg | Freq: Four times a day (QID) | RECTAL | Status: DC | PRN

## 2018-04-05 MED ORDER — CLOPIDOGREL BISULFATE 75 MG PO TABS
75.0000 mg | ORAL_TABLET | Freq: Every day | ORAL | Status: DC
  Administered 2018-04-06: 75 mg via ORAL
  Filled 2018-04-05: qty 1

## 2018-04-05 MED ORDER — HYDROMORPHONE HCL 1 MG/ML IJ SOLN
0.5000 mg | INTRAMUSCULAR | Status: DC | PRN
  Administered 2018-04-06 – 2018-04-09 (×14): 0.5 mg via INTRAVENOUS
  Filled 2018-04-05 (×14): qty 0.5

## 2018-04-05 MED ORDER — OLMESARTAN-AMLODIPINE-HCTZ 40-5-25 MG PO TABS: 1.0000 | ORAL_TABLET | Freq: Every day | ORAL | Status: DC

## 2018-04-05 MED ORDER — DOCUSATE SODIUM 100 MG PO CAPS
200.0000 mg | ORAL_CAPSULE | Freq: Every day | ORAL | Status: DC
  Administered 2018-04-06 – 2018-04-08 (×3): 200 mg via ORAL
  Filled 2018-04-05 (×3): qty 2

## 2018-04-05 MED ORDER — TEMAZEPAM 15 MG PO CAPS
30.0000 mg | ORAL_CAPSULE | Freq: Every day | ORAL | Status: DC
Start: 2018-04-06 — End: 2018-04-14
  Administered 2018-04-06 – 2018-04-13 (×8): 30 mg via ORAL
  Filled 2018-04-05 (×8): qty 2

## 2018-04-05 MED ORDER — ENOXAPARIN SODIUM 40 MG/0.4ML ~~LOC~~ SOLN
40.0000 mg | SUBCUTANEOUS | Status: DC
  Administered 2018-04-06: 40 mg via SUBCUTANEOUS
  Filled 2018-04-05: qty 0.4

## 2018-04-05 MED ORDER — SODIUM CHLORIDE 0.9 % IV BOLUS
2000.0000 mL | Freq: Once | INTRAVENOUS | Status: AC
  Administered 2018-04-05: 2000 mL via INTRAVENOUS

## 2018-04-05 NOTE — H&P (Addendum)
TRH H&P   Patient Demographics:    Valeria Boza, is a 72 y.o. female  MRN: [id number]   DOB - Oct 19, 1945  Admit Date - 04/05/2018  Outpatient Primary MD for the patient is [name], MD  Referring MD/NP/PA:  Denton Meek  Outpatient Specialists:     Patient coming from: home  Chief Complaint  Patient presents with  . Abdominal Pain      HPI:    Tommye Lehenbauer  is a 72 y.o. female, w hypertension, hyperlipidemia, dm2, pvd, CVA, OSA, Rheumatoid arthritis apparently c/o LLQ pain over the past several weeks, just started on abx yesterday, pt notes subjective fever as well as nausea.  Pt denies emesis, constipation, diarrhea, brbpr, black stool.  Pt presented due to worsening abdominal pain today.   In ED,  T 99.9 P 86  Bp 102/50 as low as 95/53 pox 96%  CT scan abd/ pelvis IMPRESSION: 1. Acute sigmoid diverticulitis with significant fluid in the sigmoid mesentery. There is no evidence for perforation or abscess. 2. Coronary artery calcifications. 3. Hepatic steatosis.  Cholecystectomy. 4. Nonobstructing intrarenal calculus in the LOWER pole the RIGHT kidney. 5. Normal appendix. 6.  Aortic atherosclerosis. 7. Probably benign ganglion cyst within the LEFT hip abductors.   Na 136, K 3.7, Bun 33, Creatinine 1.43  Ast 75, Alt 43  Wbc 13.8, Hgb 10.0, Plt 353  Pt will be admitted for acute diverticulitis w failure of oral abx, and low bp and anemia.     Review of systems:    In addition to the HPI above,  No Fever-chills, No Headache, No changes with Vision or hearing, No problems swallowing food or Liquids, No Chest pain, Cough or Shortness of Breath,  No Vommitting, Bowel movements are regular, No Blood in stool or Urine, No dysuria, No new skin rashes or bruises, No new joints pains-aches,  No new weakness, tingling, numbness in any extremity, No  recent weight gain or loss, No polyuria, polydypsia or polyphagia, No significant Mental Stressors.  A full 10 point Review of Systems was done, except as stated above, all other Review of Systems were negative.   With Past History of the following :    Past Medical History:  Diagnosis Date  . Anginal pain [facility])    sees Dr Einar Gip.   . Arthritis    rheumatoid ..   . Diabetes mellitus without complication (HCC)    Metformin 2.3.2017  . Diverticulitis   . Dysrhythmia   . Fibromyalgia   . GERD (gastroesophageal reflux disease)   . High cholesterol   . Hypertension   . Liver disease 08- 2012   per Dr Dagmar Hait pt has enlarged liver  . Peripheral vascular disease (HCC)    legs  . Pneumonia 06/17/2016  . Sleep apnea    sleep study  oct 2012  . Stroke [facility]) 2011   [place]  [place]        Past Surgical History:  Procedure Laterality Date  . [place]  . CHOLECYSTECTOMY  1996  . COLONOSCOPY N/A 02/13/2018   Procedure: COLONOSCOPY;  Surgeon: Rogene Houston, MD;  [facility];  Service: Endoscopy;  Laterality: N/A;  8:30  . CORONARY ARTERY BYPASS GRAFT  1986  . ESOPHAGEAL DILATION N/A 01/13/2018   Procedure: ESOPHAGEAL DILATION;  Surgeon: Rogene Houston, MD;  [facility];  Service: Endoscopy;  Laterality: N/A;  . ESOPHAGOGASTRODUODENOSCOPY N/A 01/13/2018   Procedure: ESOPHAGOGASTRODUODENOSCOPY (EGD);  Surgeon: Rogene Houston, MD;  [facility];  Service: Endoscopy;  Laterality: N/A;  . EYE SURGERY  2013   cat ext bilateral .  . EYE SURGERY  2002   laser  . FOREIGN BODY REMOVAL  02/13/2018   Procedure: FOREIGN BODY REMOVAL;  Surgeon: Rogene Houston, MD;  [facility];  Service: Endoscopy;;  foreign body removal which appears to be food debri in a stem form removed from colon by DR. Rehman  . [place]- 2007   rod right leg - right wrist plate  . GAS/FLUID EXCHANGE  03/25/2012   Procedure: GAS/FLUID  EXCHANGE;  Surgeon: Hayden Pedro, MD;  Location: [place];  Service: Ophthalmology;  Laterality: Right;  . PARS PLANA VITRECTOMY  03/25/2012   Procedure: PARS PLANA VITRECTOMY WITH 25 GAUGE;  Surgeon: Hayden Pedro, MD;  Location: [place];  Service: Ophthalmology;  Laterality: Right;      Social History:     Social History   Tobacco Use  . Smoking status: Former Smoker    Packs/day: 0.25    Years: 5.00    Pack years: 1.25    Types: Cigarettes    Last attempt to quit: 06/11/1984    Years since quitting: 33.8  . Smokeless tobacco: Never Used  Substance Use Topics  . Alcohol use: Yes    Comment: occ     Lives - at home  Mobility - walks by self   Family History :     Family History  Problem Relation Age of Onset  . Hypertension Daughter   . Rheum arthritis Maternal Grandmother        Home Medications:   Prior to Admission medications   Medication Sig Start Date End Date Taking? Authorizing Provider  Blood Glucose Monitoring Suppl (FREESTYLE LITE) DEVI See admin instructions. 12/22/14   [provider]  clopidogrel (PLAVIX) 75 MG tablet Take 1 tablet (75 mg total) by mouth daily with breakfast. 01/14/18   Rehman, Mechele Dawley, MD  diclofenac sodium (VOLTAREN) 1 % GEL Apply 2 g topically 4 (four) times daily as needed (Pain).     [provider]  docusate sodium (COLACE) 100 MG capsule Take 2 capsules (200 mg total) by mouth at bedtime. 02/13/18   Rehman, Mechele Dawley, MD  glipiZIDE (GLUCOTROL) 10 MG tablet Take 10 mg by mouth daily before breakfast.    [provider]  HYDROcodone-acetaminophen (NORCO) 10-325 MG tablet Take 1 tablet by mouth 4 (four) times daily.    [provider]  IRON PO Take 1 tablet by mouth daily.    [provider]  isosorbide dinitrate (ISORDIL) 5 MG tablet Take 1 tablet (5 mg total) by mouth 3 (three) times daily before meals. 02/07/18   Rehman, Mechele Dawley, MD  leflunomide (ARAVA) 20 MG tablet Take 20 mg by mouth  daily.  [provider]  metoprolol succinate (TOPROL-XL) 25 MG 24 hr tablet Take 25 mg by mouth daily.    [provider]  Olmesartan-amLODIPine-HCTZ (TRIBENZOR) 40-5-25 MG TABS Take 1 tablet by mouth daily.     [provider]  pantoprazole (PROTONIX) 40 MG tablet Take 40 mg by mouth daily.    [provider]  pravastatin (PRAVACHOL) 40 MG tablet Take 40 mg by mouth at bedtime.    [provider]  Tafluprost (ZIOPTAN) 0.0015 % SOLN Place 1 drop into both eyes at bedtime.     [provider]  temazepam (RESTORIL) 30 MG capsule Take 30 mg by mouth at bedtime.    [provider]  timolol (BETIMOL) 0.5 % ophthalmic solution Place 1 drop into both eyes 2 (two) times daily.     [provider]  VITAMIN D, ERGOCALCIFEROL, PO Take 50,000 Units by mouth once a week. Thursdays    [provider]  Wheat Dextrin (BENEFIBER DRINK MIX) PACK Take 4 g by mouth at bedtime. 02/13/18   Rogene Houston, MD     Allergies:     Allergies  Allergen Reactions  . Fentanyl Anaphylaxis and Shortness Of Breath  . Lactose Intolerance (Gi) Other (See Comments)    G.I. Upset  . Butrans [Buprenorphine] Rash and Other (See Comments)    Infected skin underneath application  . Penicillins Rash    Facial rash Has patient had a PCN reaction causing immediate rash, facial/tongue/throat swelling, SOB or lightheadedness with hypotension: Yes Has patient had a PCN reaction causing severe rash involving mucus membranes or skin necrosis: No Has patient had a PCN reaction that required hospitalization No Has patient had a PCN reaction occurring within the last 10 years: Yes If all of the above answers are "NO", then may proceed with Cephalosporin use.   . Simvastatin Rash     Physical Exam:   Vitals  Blood pressure (!) 98/47, pulse 85, temperature 99.9 F (37.7 C), temperature source Oral, resp. rate 19, weight 116.1 kg, SpO2 100 %.   1.  General  lying in bed in NAD,  2. Normal affect and insight, Not Suicidal or Homicidal, Awake Alert, Oriented X 3.  3. No F.N deficits, ALL C.Nerves Intact, Strength 5/5 all 4 extremities, Sensation intact all 4 extremities, Plantars down going.  4. Ears and Eyes appear Normal, Conjunctivae clear, PERRLA. Moist Oral Mucosa.  5. Supple Neck, No JVD, No cervical lymphadenopathy appriciated, No Carotid Bruits.  6. Symmetrical Chest wall movement, Good air movement bilaterally, CTAB.  7. RRR, No Gallops, Rubs or Murmurs, No Parasternal Heave.  8. Positive Bowel Sounds, Abdomen Soft, tenderness in the LLQ, No organomegaly appriciated,No rebound -guarding or rigidity.  9.  No Cyanosis, Normal Skin Turgor, No Skin Rash or Bruise.  10. Good muscle tone,  joints appear normal , no effusions, Normal ROM.  11. No Palpable Lymph Nodes in Neck or Axillae      Data Review:    CBC Recent Labs  Lab 04/05/18 1948 04/05/18 1952  WBC 13.8*  --   HGB 10.0* 10.9*  HCT 31.9* 32.0*  PLT 353  --   MCV 97.6  --   MCH 30.6  --   MCHC 31.3  --   RDW 16.8*  --   LYMPHSABS 0.5*  --   MONOABS 0.5  --   EOSABS 0.1  --   BASOSABS 0.1  --    ------------------------------------------------------------------------------------------------------------------  Chemistries  Recent Labs  Lab 04/05/18  1948 04/05/18 1952  NA 136 137  K 3.7 3.8  CL 102 103  CO2 26  --   GLUCOSE 102* 101*  BUN 33* 31*  CREATININE 1.43* 1.50*  CALCIUM 8.7*  --   AST 75*  --   ALT 43  --   ALKPHOS 59  --   BILITOT 0.7  --    ------------------------------------------------------------------------------------------------------------------ estimated creatinine clearance is 42.5 mL/min (A) (by C-G formula based on SCr of 1.5 mg/dL (H)). ------------------------------------------------------------------------------------------------------------------ No results for input(s): TSH, T4TOTAL, T3FREE, THYROIDAB in the  last 72 hours.  Invalid input(s): FREET3  Coagulation profile No results for input(s): INR, PROTIME in the last 168 hours. ------------------------------------------------------------------------------------------------------------------- No results for input(s): DDIMER in the last 72 hours. -------------------------------------------------------------------------------------------------------------------  Cardiac Enzymes No results for input(s): CKMB, TROPONINI, MYOGLOBIN in the last 168 hours.  Invalid input(s): CK ------------------------------------------------------------------------------------------------------------------    Component Value Date/Time   BNP 34.0 06/14/2014 1415     ---------------------------------------------------------------------------------------------------------------  Urinalysis    Component Value Date/Time   COLORURINE YELLOW 06/27/2016 1700   APPEARANCEUR HAZY (A) 06/27/2016 1700   LABSPEC 1.020 06/27/2016 1700   PHURINE 5.5 06/27/2016 1700   GLUCOSEU NEGATIVE 06/27/2016 1700   HGBUR NEGATIVE 06/27/2016 1700   BILIRUBINUR NEGATIVE 06/27/2016 1700   KETONESUR NEGATIVE 06/27/2016 1700   PROTEINUR NEGATIVE 06/27/2016 1700   UROBILINOGEN 0.2 06/20/2013 1832   NITRITE NEGATIVE 06/27/2016 1700   LEUKOCYTESUR NEGATIVE 06/27/2016 1700    ----------------------------------------------------------------------------------------------------------------   Imaging Results:    Ct Abdomen Pelvis W Contrast  Result Date: 04/05/2018 CLINICAL DATA:  Patient has bilateral lower abdominal sharp intermittent pain with nausea without vomiting. H/x diverticulitis patient states pain and symptoms similar to past. EXAM: CT ABDOMEN AND PELVIS WITH CONTRAST TECHNIQUE: Multidetector CT imaging of the abdomen and pelvis was performed using the standard protocol following bolus administration of intravenous contrast. CONTRAST:  29mL ISOVUE-300 IOPAMIDOL (ISOVUE-300)  INJECTION 61% COMPARISON:  02/06/2018 FINDINGS: Lower chest: There is scarring in both lung bases. Coronary artery calcifications are present. The heart is normal in size. Hepatobiliary: Liver is diffusely low attenuation. No focal liver lesions. Cholecystectomy. Pancreas: Unremarkable. No pancreatic ductal dilatation or surrounding inflammatory changes. Spleen: Normal in size without focal abnormality. Adrenals/Urinary Tract: Adrenal glands are normal. There is symmetric enhancement and excretion from the kidneys. 1 millimeter intrarenal calcification identified in the LOWER pole of the RIGHT kidney, not associated with obstruction. Ureters are unremarkable. The bladder and visualized portion of the urethra are normal. Stomach/Bowel: The stomach is normal in appearance. Small bowel loops are normal in caliber and wall thickness. The appendix is well seen and has a normal appearance. There is significant inflammatory change in the region of sigmoid colon and associated numerous diverticula in this segment. Fluid extends in the sigmoid mesentery. The diseased segment of sigmoid is adjacent to the uterus and the urinary bladder. No evidence for abscess or perforation. Vascular/Lymphatic: There is atherosclerotic calcification of the abdominal aorta. There is normal vascular opacification of the celiac axis, superior mesenteric artery, and inferior mesenteric artery. Normal appearance of the portal venous system and inferior vena cava. Reproductive: The uterus is present and is surrounded by inflammatory changes from the sigmoid diverticulitis. No adnexal mass. Other: Trace amount of free pelvic fluid. Musculoskeletal: Incompletely imaged fluid attenuation collection within the LEFT hip abductors, measuring at least 7.3 x 4.8 centimeters. The appearance is stable since 2017. There are degenerative changes in the lumbar spine with 7 millimeters anterolisthesis of L4 on L5. Median sternotomy. IMPRESSION:  1. Acute sigmoid  diverticulitis with significant fluid in the sigmoid mesentery. There is no evidence for perforation or abscess. 2. Coronary artery calcifications. 3. Hepatic steatosis.  Cholecystectomy. 4. Nonobstructing intrarenal calculus in the LOWER pole the RIGHT kidney. 5. Normal appendix. 6.  Aortic atherosclerosis. 7. Probably benign ganglion cyst within the LEFT hip abductors. Electronically Signed   By: Nolon Nations M.D.   On: 04/05/2018 22:06       Assessment & Plan:    Principal Problem:   Diverticulitis Active Problems:   DM (diabetes mellitus) ([place])   HTN (hypertension)   Anemia   Renal insufficiency    Acute diverticulitis  Blood culture x2 pending Clear liquids overnite Cipro iv, Flagyl iv pharmacy to dose  Anemia Check cbc in am  Renal insufficiency Hydrate with ns iv Check cmp in am  Dm2 HOLD glipizide fsbs ac and qhs, ISS  Hypertension, PVD Cont Plavix 75mg  po qday Cont Toprol XL 25mg   po qday Cont Tribenzor  Hyperlipidemia Cont Pravastatin 40mg  po qhs  Gerd Cont PPI  Insomnia Cont Restoril 30mg  po qhs prn   Rheumatoid arthritis Cont Arava 20mg  po qday  Glaucoma  Cont Timolol Cont Zioptan   DVT Prophylaxis   Lovenox - SCDs  AM Labs Ordered, also please review Full Orders  Family Communication: Admission, patients condition and plan of care including tests being ordered have been discussed with the patient  who indicate understanding and agree with the plan and Code Status.  Code Status  FULL CODE  Likely DC to home  Condition GUARDED   Consults called:    none  Admission status:  observation  Time spent in minutes : 15   [name] M.D on 04/05/2018 at 10:37 PM  Between 7am to 7pm - Pager - [phone]  . After 7pm go to [email or web address] - password [email or web address]  [facility] - Office  [phone]

## 2018-04-05 NOTE — ED Triage Notes (Signed)
Pt C/O LLQ pain that began "a couple days ago." Pt states that she has taken 10mg  hydrocodone around 1300 with no relief. Pt denies N/V/D. Pt has also finished course of antibiotic.

## 2018-04-05 NOTE — ED Notes (Signed)
Pt's O2 staying at 87-89, placed on [place] 1L/min. O2 at 93 now

## 2018-04-05 NOTE — ED Provider Notes (Signed)
[facility] EMERGENCY DEPARTMENT Provider Note   CSN: [id number] Arrival date & time: 04/05/18  1902     History   Chief Complaint Chief Complaint  Patient presents with  . Abdominal Pain    HPI [name] is a 72 y.o. female.  Patient complains of abdominal pain.  She believes it is her diverticulitis acting up again.  She has been on antibiotics p.o. and the pain is getting worse  The history is provided by the patient. No language interpreter was used.  Abdominal Pain   This is a recurrent problem. The current episode started 2 days ago. The problem occurs constantly. The problem has not changed since onset.The pain is associated with an unknown factor. The pain is located in the LLQ. The quality of the pain is aching. The pain is at a severity of 6/10. The pain is moderate. Associated symptoms include anorexia. Pertinent negatives include diarrhea, frequency, hematuria and headaches. Nothing aggravates the symptoms. Nothing relieves the symptoms. Past workup does not include GI consult. Her past medical history does not include PUD.    Past Medical History:  Diagnosis Date  . Anginal pain [facility])    sees Dr Einar Gip.   . Arthritis    rheumatoid ..   . Diabetes mellitus without complication (HCC)    Metformin 2.3.2017  . Diverticulitis   . Dysrhythmia   . Fibromyalgia   . GERD (gastroesophageal reflux disease)   . High cholesterol   . Hypertension   . Liver disease 08- 2012   per Dr Dagmar Hait pt has enlarged liver  . Peripheral vascular disease (HCC)    legs  . Pneumonia 06/17/2016  . Sleep apnea    sleep study  oct 2012  . Stroke [facility]) 2011   [facility]      Patient Active Problem List   Diagnosis Date Noted  . Abnormal abdominal CT scan 02/11/2018  . Esophageal dysphagia 01/03/2018  . AKI (acute kidney injury) ([place]) 06/27/2016  . Rheumatoid arthritis ([place]) 06/27/2016  . Carpal tunnel syndrome 03/24/2014  . Paresthesias/numbness 03/24/2014  . Obesity,  unspecified 09/23/2013  . Pulmonary nodules 07/03/2013  . TIA (transient ischemic attack) 06/20/2013  . Left arm weakness 06/20/2013  . DM (diabetes mellitus) ([place]) 06/20/2013  . HTN (hypertension) 06/20/2013  . Chronic pain 06/20/2013  . Vitreous hemorrhage ([place]) 03/25/2012    Past Surgical History:  Procedure Laterality Date  . [place]  . CHOLECYSTECTOMY  1996  . COLONOSCOPY N/A 02/13/2018   Procedure: COLONOSCOPY;  Surgeon: Rogene Houston, MD;  Location: [facility] ENDO [facility]ITE;  Service: Endoscopy;  Laterality: N/A;  8:30  . CORONARY ARTERY BYPASS GRAFT  1986  . ESOPHAGEAL DILATION N/A 01/13/2018   Procedure: ESOPHAGEAL DILATION;  Surgeon: Rogene Houston, MD;  Location: [facility] ENDO SUITE;  Service: Endoscopy;  Laterality: N/A;  . ESOPHAGOGASTRODUODENOSCOPY N/A 01/13/2018   Procedure: ESOPHAGOGASTRODUODENOSCOPY (EGD);  Surgeon: Rogene Houston, MD;  Location: [facility] ENDO [facility]ITE;  Service: Endoscopy;  Laterality: N/A;  . EYE SURGERY  2013   cat ext bilateral .  . EYE SURGERY  2002   laser  . FOREIGN BODY REMOVAL  02/13/2018   Procedure: FOREIGN BODY REMOVAL;  Surgeon: Rogene Houston, MD;  Location: [facility] END[facility];  Service: Endoscopy;;  foreign body removal which appears to be food debri in a stem form removed from colon by DR. Rehman  . [place]- 2007   rod right leg - right wrist  plate  . GAS/FLUID EXCHANGE  03/25/2012   Procedure: GAS/FLUID EXCHANGE;  Surgeon: Hayden Pedro, MD;  Location: [place];  Service: Ophthalmology;  Laterality: Right;  . PARS PLANA VITRECTOMY  03/25/2012   Procedure: PARS PLANA VITRECTOMY WITH 25 GAUGE;  Surgeon: Hayden Pedro, MD;  Location: [place];  Service: Ophthalmology;  Laterality: Right;     OB History   None      Home Medications    Prior to Admission medications   Medication Sig Start Date End Date Taking? Authorizing Provider  Blood Glucose Monitoring Suppl (FREESTYLE LITE) DEVI See admin instructions. 12/22/14    [provider]  clopidogrel (PLAVIX) 75 MG tablet Take 1 tablet (75 mg total) by mouth daily with breakfast. 01/14/18   Rehman, Mechele Dawley, MD  diclofenac sodium (VOLTAREN) 1 % GEL Apply 2 g topically 4 (four) times daily as needed (Pain).     [provider]  docusate sodium (COLACE) 100 MG capsule Take 2 capsules (200 mg total) by mouth at bedtime. 02/13/18   Rehman, Mechele Dawley, MD  glipiZIDE (GLUCOTROL) 10 MG tablet Take 10 mg by mouth daily before breakfast.    [provider]  HYDROcodone-acetaminophen (NORCO) 10-325 MG tablet Take 1 tablet by mouth 4 (four) times daily.    [provider]  IRON PO Take 1 tablet by mouth daily.    [provider]  isosorbide dinitrate (ISORDIL) 5 MG tablet Take 1 tablet (5 mg total) by mouth 3 (three) times daily before meals. 02/07/18   Rehman, Mechele Dawley, MD  leflunomide (ARAVA) 20 MG tablet Take 20 mg by mouth daily.    [provider]  metoprolol succinate (TOPROL-XL) 25 MG 24 hr tablet Take 25 mg by mouth daily.    [provider]  Olmesartan-amLODIPine-HCTZ (TRIBENZOR) 40-5-25 MG TABS Take 1 tablet by mouth daily.     [provider]  pantoprazole (PROTONIX) 40 MG tablet Take 40 mg by mouth daily.    [provider]  pravastatin (PRAVACHOL) 40 MG tablet Take 40 mg by mouth at bedtime.    [provider]  Tafluprost (ZIOPTAN) 0.0015 % SOLN Place 1 drop into both eyes at bedtime.     [provider]  temazepam (RESTORIL) 30 MG capsule Take 30 mg by mouth at bedtime.    [provider]  timolol (BETIMOL) 0.5 % ophthalmic solution Place 1 drop into both eyes 2 (two) times daily.     [provider]  VITAMIN D, ERGOCALCIFEROL, PO Take 50,000 Units by mouth once a week. Thursdays    [provider]  Wheat Dextrin (BENEFIBER DRINK MIX) PACK Take 4 g by mouth at bedtime. 02/13/18   Rogene Houston, MD    Family History Family History  Problem  Relation Age of Onset  . Hypertension Daughter   . Rheum arthritis Maternal Grandmother     Social History Social History   Tobacco Use  . Smoking status: Former Smoker    Packs/day: 0.25    Years: 5.00    Pack years: 1.25    Types: Cigarettes    Last attempt to quit: 06/11/1984    Years since quitting: 33.8  . Smokeless tobacco: Never Used  Substance Use Topics  . Alcohol use: Yes    Comment: occ  . Drug use: No    Types: Hydrocodone     Allergies   Fentanyl; Lactose intolerance (gi); Butrans [buprenorphine]; Penicillins; and Simvastatin   Review of Systems Review  of Systems  Constitutional: Negative for appetite change and fatigue.  HENT: Negative for congestion, ear discharge and sinus pressure.   Eyes: Negative for discharge.  Respiratory: Negative for cough.   Cardiovascular: Negative for chest pain.  Gastrointestinal: Positive for abdominal pain and anorexia. Negative for diarrhea.  Genitourinary: Negative for frequency and hematuria.  Musculoskeletal: Negative for back pain.  Skin: Negative for rash.  Neurological: Negative for seizures and headaches.  Psychiatric/Behavioral: Negative for hallucinations.     Physical Exam Updated Vital Signs BP (!) 98/47   Pulse 85   Temp 99.9 F (37.7 C) (Oral)   Resp 19   Wt 116.1 kg   SpO2 100%   BMI 42.59 kg/m   Physical Exam  Constitutional: She is oriented to person, place, and time. She appears well-developed.  HENT:  Head: Normocephalic.  Eyes: Conjunctivae and EOM are normal. No scleral icterus.  Neck: Neck supple. No thyromegaly present.  Cardiovascular: Normal rate and regular rhythm. Exam reveals no gallop and no friction rub.  No murmur heard. Pulmonary/Chest: No stridor. She has no wheezes. She has no rales. She exhibits no tenderness.  Abdominal: She exhibits no distension. There is tenderness. There is no rebound.  Tender left lower quadrant  Musculoskeletal: Normal range of motion. She exhibits  no edema.  Lymphadenopathy:    She has no cervical adenopathy.  Neurological: She is oriented to person, place, and time. She exhibits normal muscle tone. Coordination normal.  Skin: No rash noted. No erythema.  Psychiatric: She has a normal mood and affect. Her behavior is normal.     ED Treatments / Results  Labs (all labs ordered are listed, but only abnormal results are displayed) Labs Reviewed  COMPREHENSIVE METABOLIC PANEL - Abnormal; Notable for the following components:      Result Value   Glucose, Bld 102 (*)    BUN 33 (*)    Creatinine, Ser 1.43 (*)    Calcium 8.7 (*)    Albumin 3.3 (*)    AST 75 (*)    GFR calc non Af Amer 35 (*)    GFR calc Af Amer 41 (*)    All other components within normal limits  CBC WITH DIFFERENTIAL/PLATELET - Abnormal; Notable for the following components:   WBC 13.8 (*)    RBC 3.27 (*)    Hemoglobin 10.0 (*)    HCT 31.9 (*)    RDW 16.8 (*)    Neutro Abs 12.7 (*)    Lymphs Abs 0.5 (*)    All other components within normal limits  I-STAT CHEM 8, ED - Abnormal; Notable for the following components:   BUN 31 (*)    Creatinine, Ser 1.50 (*)    Glucose, Bld 101 (*)    Calcium, Ion 0.72 (*)    Hemoglobin 10.9 (*)    HCT 32.0 (*)    All other components within normal limits  CULTURE, BLOOD (ROUTINE X 2)  CULTURE, BLOOD (ROUTINE X 2)  URINALYSIS, ROUTINE W REFLEX MICROSCOPIC  I-STAT CG4 LACTIC ACID, ED  I-STAT CG4 LACTIC ACID, ED    EKG None  Radiology Ct Abdomen Pelvis W Contrast  Result Date: 04/05/2018 CLINICAL DATA:  Patient has bilateral lower abdominal sharp intermittent pain with nausea without vomiting. H/x diverticulitis patient states pain and symptoms similar to past. EXAM: CT ABDOMEN AND PELVIS WITH CONTRAST TECHNIQUE: Multidetector CT imaging of the abdomen and pelvis was performed using the standard protocol following bolus administration of intravenous contrast. CONTRAST:  18mL  ISOVUE-300 IOPAMIDOL (ISOVUE-300)  INJECTION 61% COMPARISON:  02/06/2018 FINDINGS: Lower chest: There is scarring in both lung bases. Coronary artery calcifications are present. The heart is normal in size. Hepatobiliary: Liver is diffusely low attenuation. No focal liver lesions. Cholecystectomy. Pancreas: Unremarkable. No pancreatic ductal dilatation or surrounding inflammatory changes. Spleen: Normal in size without focal abnormality. Adrenals/Urinary Tract: Adrenal glands are normal. There is symmetric enhancement and excretion from the kidneys. 1 millimeter intrarenal calcification identified in the LOWER pole of the RIGHT kidney, not associated with obstruction. Ureters are unremarkable. The bladder and visualized portion of the urethra are normal. Stomach/Bowel: The stomach is normal in appearance. Small bowel loops are normal in caliber and wall thickness. The appendix is well seen and has a normal appearance. There is significant inflammatory change in the region of sigmoid colon and associated numerous diverticula in this segment. Fluid extends in the sigmoid mesentery. The diseased segment of sigmoid is adjacent to the uterus and the urinary bladder. No evidence for abscess or perforation. Vascular/Lymphatic: There is atherosclerotic calcification of the abdominal aorta. There is normal vascular opacification of the celiac axis, superior mesenteric artery, and inferior mesenteric artery. Normal appearance of the portal venous system and inferior vena cava. Reproductive: The uterus is present and is surrounded by inflammatory changes from the sigmoid diverticulitis. No adnexal mass. Other: Trace amount of free pelvic fluid. Musculoskeletal: Incompletely imaged fluid attenuation collection within the LEFT hip abductors, measuring at least 7.3 x 4.8 centimeters. The appearance is stable since 2017. There are degenerative changes in the lumbar spine with 7 millimeters anterolisthesis of L4 on L5. Median sternotomy. IMPRESSION: 1. Acute sigmoid  diverticulitis with significant fluid in the sigmoid mesentery. There is no evidence for perforation or abscess. 2. Coronary artery calcifications. 3. Hepatic steatosis.  Cholecystectomy. 4. Nonobstructing intrarenal calculus in the LOWER pole the RIGHT kidney. 5. Normal appendix. 6.  Aortic atherosclerosis. 7. Probably benign ganglion cyst within the LEFT hip abductors. Electronically Signed   By: Nolon Nations M.D.   On: 04/05/2018 22:06    Procedures Procedures (including critical care time)  Medications Ordered in ED Medications  sodium chloride 0.9 % bolus 2,000 mL (0 mLs Intravenous Stopped 04/05/18 2124)  ciprofloxacin (CIPRO) IVPB 400 mg (0 mg Intravenous Stopped 04/05/18 2217)  metroNIDAZOLE (FLAGYL) IVPB 500 mg (0 mg Intravenous Stopped 04/05/18 2102)  HYDROmorphone (DILAUDID) injection 1 mg (1 mg Intravenous Given 04/05/18 2043)  ondansetron (ZOFRAN) injection 4 mg (4 mg Intravenous Given 04/05/18 2044)  iopamidol (ISOVUE-300) 61 % injection 80 mL (80 mLs Intravenous Contrast Given 04/05/18 2123)     Initial Impression / Assessment and Plan / ED Course  I have reviewed the triage vital signs and the nursing notes.  Pertinent labs & imaging results that were available during my care of the patient were reviewed by me and considered in my medical decision making (see chart for details).     Patient with diverticulitis that is failed outpatient treatment.  She will be admitted to the hospital for IV antibiotics and pain control  Final Clinical Impressions(s) / ED Diagnoses   Final diagnoses:  Diverticulitis    ED Discharge Orders    None       Milton Ferguson, MD 04/05/18 2235

## 2018-04-06 ENCOUNTER — Observation Stay (HOSPITAL_COMMUNITY): Payer: Medicare Other

## 2018-04-06 DIAGNOSIS — M069 Rheumatoid arthritis, unspecified: Secondary | ICD-10-CM

## 2018-04-06 DIAGNOSIS — A419 Sepsis, unspecified organism: Secondary | ICD-10-CM | POA: Diagnosis not present

## 2018-04-06 DIAGNOSIS — R0600 Dyspnea, unspecified: Secondary | ICD-10-CM | POA: Diagnosis not present

## 2018-04-06 DIAGNOSIS — K5732 Diverticulitis of large intestine without perforation or abscess without bleeding: Secondary | ICD-10-CM | POA: Diagnosis not present

## 2018-04-06 DIAGNOSIS — N183 Chronic kidney disease, stage 3 unspecified: Secondary | ICD-10-CM | POA: Diagnosis present

## 2018-04-06 DIAGNOSIS — K5792 Diverticulitis of intestine, part unspecified, without perforation or abscess without bleeding: Secondary | ICD-10-CM | POA: Diagnosis not present

## 2018-04-06 DIAGNOSIS — M797 Fibromyalgia: Secondary | ICD-10-CM | POA: Diagnosis not present

## 2018-04-06 LAB — CBC
HCT: 25.7 % — ABNORMAL LOW (ref 36.0–46.0)
HEMATOCRIT: 27 % — AB (ref 36.0–46.0)
HEMOGLOBIN: 8.3 g/dL — AB (ref 12.0–15.0)
Hemoglobin: 7.7 g/dL — ABNORMAL LOW (ref 12.0–15.0)
MCH: 30.4 pg (ref 26.0–34.0)
MCH: 29.7 pg (ref 26.0–34.0)
MCHC: 30.7 g/dL (ref 30.0–36.0)
MCHC: 30 g/dL (ref 30.0–36.0)
MCV: 98.9 fL (ref 80.0–100.0)
MCV: 99.2 fL (ref 80.0–100.0)
NRBC: 0 % (ref 0.0–0.2)
PLATELETS: 257 10*3/uL (ref 150–400)
Platelets: 291 10*3/uL (ref 150–400)
RBC: 2.73 MIL/uL — AB (ref 3.87–5.11)
RBC: 2.59 MIL/uL — ABNORMAL LOW (ref 3.87–5.11)
RDW: 16.9 % — ABNORMAL HIGH (ref 11.5–15.5)
RDW: 17.2 % — AB (ref 11.5–15.5)
WBC: 11 10*3/uL — ABNORMAL HIGH (ref 4.0–10.5)
WBC: 13.2 10*3/uL — AB (ref 4.0–10.5)
nRBC: 0 % (ref 0.0–0.2)

## 2018-04-06 LAB — BASIC METABOLIC PANEL
ANION GAP: 8 (ref 5–15)
BUN: 30 mg/dL — ABNORMAL HIGH (ref 8–23)
CALCIUM: 7.1 mg/dL — AB (ref 8.9–10.3)
CO2: 23 mmol/L (ref 22–32)
CREATININE: 1.81 mg/dL — AB (ref 0.44–1.00)
Chloride: 105 mmol/L (ref 98–111)
GFR, EST AFRICAN AMERICAN: 31 mL/min — AB (ref >60)
GFR, EST NON AFRICAN AMERICAN: 27 mL/min — AB (ref >60)
Glucose, Bld: 88 mg/dL (ref 70–99)
Potassium: 3.5 mmol/L (ref 3.5–5.1)
SODIUM: 136 mmol/L (ref 135–145)

## 2018-04-06 LAB — GLUCOSE, CAPILLARY
GLUCOSE-CAPILLARY: 116 mg/dL — AB (ref 70–99)
GLUCOSE-CAPILLARY: 38 mg/dL — AB (ref 70–99)
GLUCOSE-CAPILLARY: 40 mg/dL — AB (ref 70–99)
GLUCOSE-CAPILLARY: 95 mg/dL (ref 70–99)
GLUCOSE-CAPILLARY: 64 mg/dL — AB (ref 70–99)
GLUCOSE-CAPILLARY: 103 mg/dL — AB (ref 70–99)
Glucose-Capillary: 69 mg/dL — ABNORMAL LOW (ref 70–99)
Glucose-Capillary: 90 mg/dL (ref 70–99)

## 2018-04-06 LAB — LACTIC ACID, PLASMA
Lactic Acid, Venous: 1.1 mmol/L (ref 0.5–1.9)
Lactic Acid, Venous: 1 mmol/L (ref 0.5–1.9)

## 2018-04-06 LAB — APTT: APTT: 33 s (ref 24–36)

## 2018-04-06 LAB — COMPREHENSIVE METABOLIC PANEL
ALBUMIN: 2.7 g/dL — AB (ref 3.5–5.0)
ALT: 38 U/L (ref 0–44)
ANION GAP: 7 (ref 5–15)
AST: 58 U/L — AB (ref 15–41)
Alkaline Phosphatase: 45 U/L (ref 38–126)
BILIRUBIN TOTAL: 0.6 mg/dL (ref 0.3–1.2)
BUN: 28 mg/dL — AB (ref 8–23)
CHLORIDE: 104 mmol/L (ref 98–111)
CO2: 25 mmol/L (ref 22–32)
Calcium: 7.8 mg/dL — ABNORMAL LOW (ref 8.9–10.3)
Creatinine, Ser: 1.38 mg/dL — ABNORMAL HIGH (ref 0.44–1.00)
GFR calc Af Amer: 43 mL/min — ABNORMAL LOW (ref >60)
GFR calc non Af Amer: 37 mL/min — ABNORMAL LOW (ref >60)
GLUCOSE: 106 mg/dL — AB (ref 70–99)
Potassium: 3.4 mmol/L — ABNORMAL LOW (ref 3.5–5.1)
SODIUM: 136 mmol/L (ref 135–145)
Total Protein: 5.8 g/dL — ABNORMAL LOW (ref 6.5–8.1)

## 2018-04-06 LAB — PROCALCITONIN: Procalcitonin: 3.25 ng/mL

## 2018-04-06 LAB — PROTIME-INR
INR: 1.22
Prothrombin Time: 15.3 seconds — ABNORMAL HIGH (ref 11.4–15.2)

## 2018-04-06 MED ORDER — SODIUM CHLORIDE 0.9% FLUSH: 10.0000 mL | INTRAVENOUS | Status: DC | PRN

## 2018-04-06 MED ORDER — CHLORHEXIDINE GLUCONATE CLOTH 2 % EX PADS
6.0000 | MEDICATED_PAD | Freq: Every day | CUTANEOUS | Status: DC
  Administered 2018-04-06 – 2018-05-13 (×29): 6 via TOPICAL

## 2018-04-06 MED ORDER — INFLUENZA VAC SPLIT HIGH-DOSE 0.5 ML IM SUSY
0.5000 mL | PREFILLED_SYRINGE | INTRAMUSCULAR | Status: AC
  Administered 2018-04-07: 0.5 mL via INTRAMUSCULAR
  Filled 2018-04-06: qty 0.5

## 2018-04-06 MED ORDER — IRBESARTAN 300 MG PO TABS: 300.0000 mg | ORAL_TABLET | Freq: Every day | ORAL | Status: DC

## 2018-04-06 MED ORDER — POTASSIUM CHLORIDE IN NACL 20-0.9 MEQ/L-% IV SOLN
INTRAVENOUS | Status: DC
  Administered 2018-04-06 – 2018-04-14 (×14): via INTRAVENOUS

## 2018-04-06 MED ORDER — SODIUM CHLORIDE 0.9 % IV SOLN
1.0000 g | Freq: Two times a day (BID) | INTRAVENOUS | Status: DC
  Administered 2018-04-06 – 2018-04-09 (×7): 1 g via INTRAVENOUS
  Filled 2018-04-06 (×11): qty 1

## 2018-04-06 MED ORDER — SODIUM CHLORIDE 0.9 % IV BOLUS
1000.0000 mL | Freq: Once | INTRAVENOUS | Status: AC
  Administered 2018-04-06: 1000 mL via INTRAVENOUS

## 2018-04-06 MED ORDER — CIPROFLOXACIN IN D5W 400 MG/200ML IV SOLN
400.0000 mg | Freq: Two times a day (BID) | INTRAVENOUS | Status: DC
  Administered 2018-04-06: 400 mg via INTRAVENOUS
  Filled 2018-04-06: qty 200

## 2018-04-06 MED ORDER — SODIUM CHLORIDE 0.9% FLUSH
10.0000 mL | Freq: Two times a day (BID) | INTRAVENOUS | Status: DC
  Administered 2018-04-06 – 2018-04-09 (×6): 10 mL
  Administered 2018-04-09: 20 mL
  Administered 2018-04-10 – 2018-04-11 (×3): 10 mL
  Administered 2018-04-11: 20 mL
  Administered 2018-04-12 – 2018-04-13 (×4): 10 mL
  Administered 2018-04-14: 30 mL
  Administered 2018-04-19 – 2018-04-25 (×8): 10 mL

## 2018-04-06 MED ORDER — SODIUM CHLORIDE 0.9 % IV SOLN: 1.0000 g | Freq: Three times a day (TID) | INTRAVENOUS | Status: DC

## 2018-04-06 MED ORDER — DEXTROSE 50 % IV SOLN
INTRAVENOUS | Status: AC
  Administered 2018-04-06: 25 mL
  Filled 2018-04-06: qty 50

## 2018-04-06 MED ORDER — TIMOLOL MALEATE 0.5 % OP SOLN
1.0000 [drp] | Freq: Two times a day (BID) | OPHTHALMIC | Status: DC
  Administered 2018-04-06 – 2018-05-13 (×70): 1 [drp] via OPHTHALMIC
  Filled 2018-04-06 (×4): qty 5

## 2018-04-06 MED ORDER — HYDROCORTISONE NA SUCCINATE PF 100 MG IJ SOLR
100.0000 mg | Freq: Three times a day (TID) | INTRAMUSCULAR | Status: DC
  Administered 2018-04-06 – 2018-04-08 (×5): 100 mg via INTRAVENOUS
  Filled 2018-04-06 (×5): qty 2

## 2018-04-06 MED ORDER — AMLODIPINE BESYLATE 5 MG PO TABS: 5.0000 mg | ORAL_TABLET | Freq: Every day | ORAL | Status: DC

## 2018-04-06 MED ORDER — HYDROCHLOROTHIAZIDE 25 MG PO TABS: 25.0000 mg | ORAL_TABLET | Freq: Every day | ORAL | Status: DC

## 2018-04-06 NOTE — Progress Notes (Signed)
Xcover CT scan shows perforation of sigmoid colon Pt resting comfortably at the present time. No abdominal pain but she is on dilaudid  Exam: T afebrile,  P 75  Sbp 94 pox 97%  Heent: anicteric Neck: no jvd Heart: rrr s1, s2,  Lung: ctab ABd: soft, obese, slight tenderness LLQ,  no guarding, no rebound , no rigidity, + bs Ext: no c/c/e  A/P Perforated sigmoid diverticulitis NPO except for medication Cont cipro iv, Flagyl iv Surgery consulted,  Spoke with Dr. Arnoldo Morale, wishes her to be NPO as above. Appreciate input.

## 2018-04-06 NOTE — Progress Notes (Signed)
Responded to nursing call: hypotension 80s over 40s after fluid bolus    Subjective: Pt c/o abd pain but states it is "20% better".  C/o nausea.  No vomiting, cp, sob, diarrhea  Vitals:   04/06/18 1239 04/06/18 1451 04/06/18 1542 04/06/18 1602  BP: (!) 86/53 (!) 71/31 (!) 69/39 (!) 82/46  Pulse: 84 76 85   Resp:  16    Temp:  (!) 101.2 F (38.4 C) 100.3 F (37.9 C)   TempSrc:  Oral Oral   SpO2:  99% 94%   Weight:       CV--RRR Lung-diminished BS at bases, basilar crackles Abd--soft+BS/LLQ tender LUQ tender     Assessment/Plan: Sepsis -due to diverticulitis -question perforation with free air under diaphragm on CXR -repeat CT abd/pelvis -repeat bolus IV NS -transfer to stepdown -check lactate, PCT, coags, cortisol -check cbc BMP -consult general surgery -place PICC/midline -start IV hydrocortisone -if no improvement--start vasopressors -patient and daughter updated at [place], DO [facility]

## 2018-04-06 NOTE — Progress Notes (Addendum)
PROGRESS NOTE  [name] EPP:[id number] DOB: 08/31/1945 DOA: 04/05/2018 PCP: [name], MD  Brief History:  72 year old female with a history of coronary artery disease, hyperlipidemia, stroke, hypertension, diabetes mellitus, rheumatoid arthritis, fibromyalgia/chronic pain presenting with 2-week history of lower abdominal pain.  The patient states that has progressively worsened.  She felt that she may have had some UTI type symptoms and contacted her primary care provider on 04/03/2018.  The patient was prescribed antibiotic the name of which she does not remember.  She began taking antibiotics on the evening of 04/03/2018.  Her abdominal pain persisted.  As result, the patient presented for further evaluation.  Patient had complained of fevers and chills up to 103.0 F at home.  She denied any headache, neck pain, chest pain, coughing, vomiting, diarrhea, hematochezia, melena, dysuria, hematuria.  She has chronic shortness of breath which she states has not worsened.  Patient has been using hydrocodone for her abdominal pain.  She has been receiving hydrocodone from her primary care provider for her rheumatoid arthritis and fibromyalgia.  Upon presentation, the patient was noted to have WBC 13.8 with a fever 102.5 F.  CT of the abdomen and pelvis showed acute sigmoid diverticulitis with significant fluid around the sigmoid mesentery.  There was no perforation or abscess.  The patient was started on Cipro and Flagyl and IV fluids  Assessment/Plan: Sepsis -Present at the time of admission -Patient presented with relative hypotension, fever and leukocytosis -Continue IV fluids -Continue IV Cipro and Flagyl -Lactic acid 1.06 -Follow blood cultures--suspect they may be compromised as the patient was on outpatient antibiotics prior to admission -04/05/18 UA--no pyuria  Acute sigmoid diverticulitis -Continue Cipro and Flagyl -Clear liquid diet -Judicious IV Dilaudid  CKD stage  III -Baseline creatinine 1.2-1.5 -A.m. BMP  Essential hypertension -Holding amlodipine, ARB, and HCTZ secondary to hypotension and soft blood pressure -Continue metoprolol succinate as blood pressure allows  Dyspnea -obtain CXR  Diabetes mellitus type 2 -Holding glipizide -Hemoglobin A1c -NovoLog sliding scale  Coronary artery disease -No chest pain presently -Continue Plavix -Continue metoprolol succinate as blood pressure allows  Rheumatoid arthritis -Continue Arava -pt follows Dr. Amil Amen in outpatient setting  Hyperlipidemia -Continue statin  Chronic pain syndrome/Fibromyalgia -continue home dose hydrocodone    Disposition Plan:   Home in 2-3 days  Family Communication:   Family at bedside  Consultants:    Code Status:  FULL / DNR  DVT Prophylaxis:  [place] Heparin / [place] Lovenox   Procedures: As Listed in Progress Note Above  Antibiotics: cipro 10/26>>> Flagyl 10/26>>>    Subjective: Patient continues to have lower abdominal pain isolated and suprapubic and left lower quadrant.  She states it is little better than yesterday.  She denies any headache, chest pain, shortness breath, vomiting, diarrhea, hematochezia, melena.  There is no coughing or hemoptysis.  Objective: Vitals:   04/05/18 2300 04/05/18 2331 04/06/18 0604 04/06/18 0637  BP: (!) 102/50 (!) 102/54 (!) 83/33 (!) 111/54  Pulse: 86 83 87 90  Resp: 16 14 18 18   Temp:  99.5 F (37.5 C) (!) 102.5 F (39.2 C) (!) 100.6 F (38.1 C)  TempSrc:  Oral Oral Oral  SpO2: 96% 99% 98% 97%  Weight:    118.1 kg    Intake/Output Summary (Last 24 hours) at 04/06/2018 0844 Last data filed at 04/06/2018 0600 Gross per 24 hour  Intake 2771.15 ml  Output -  Net 2771.15 ml  Weight change:  Exam:   General:  Pt is alert, follows commands appropriately, not in acute distress  HEENT: No icterus, No thrush, No neck mass, [place]/AT  Cardiovascular: RRR, S1/S2, no rubs, no gallops  Respiratory: CTA  bilaterally, no wheezing, no crackles, no rhonchi  Abdomen: Soft/+BS, LLQ tender, non distended, no guarding  Extremities: No edema, No lymphangitis, No petechiae, No rashes, no synovitis   Data Reviewed: I have personally reviewed following labs and imaging studies Basic Metabolic Panel: Recent Labs  Lab 04/05/18 1948 04/05/18 1952 04/06/18 0625  NA 136 137 136  K 3.7 3.8 3.4*  CL 102 103 104  CO2 26  --  25  GLUCOSE 102* 101* 106*  BUN 33* 31* 28*  CREATININE 1.43* 1.50* 1.38*  CALCIUM 8.7*  --  7.8*   Liver Function Tests: Recent Labs  Lab 04/05/18 1948 04/06/18 0625  AST 75* 58*  ALT 43 38  ALKPHOS 59 45  BILITOT 0.7 0.6  PROT 6.9 5.8*  ALBUMIN 3.3* 2.7*   No results for input(s): LIPASE, AMYLASE in the last 168 hours. No results for input(s): AMMONIA in the last 168 hours. Coagulation Profile: No results for input(s): INR, PROTIME in the last 168 hours. CBC: Recent Labs  Lab 04/05/18 1948 04/05/18 1952 04/06/18 0625  WBC 13.8*  --  11.0*  NEUTROABS 12.7*  --   --   HGB 10.0* 10.9* 8.3*  HCT 31.9* 32.0* 27.0*  MCV 97.6  --  98.9  PLT 353  --  291   Cardiac Enzymes: No results for input(s): CKTOTAL, CKMB, CKMBINDEX, TROPONINI in the last 168 hours. BNP: Invalid input(s): POCBNP CBG: Recent Labs  Lab 04/05/18 2346 04/06/18 0354 04/06/18 0413 04/06/18 0423 04/06/18 0730  GLUCAP 74 38* 40* 116* 95   HbA1C: No results for input(s): HGBA1C in the last 72 hours. Urine analysis:    Component Value Date/Time   COLORURINE YELLOW 04/05/2018 1924   APPEARANCEUR CLEAR 04/05/2018 1924   LABSPEC 1.021 04/05/2018 1924   PHURINE 5.0 04/05/2018 1924   GLUCOSEU NEGATIVE 04/05/2018 1924   HGBUR NEGATIVE 04/05/2018 1924   BILIRUBINUR NEGATIVE 04/05/2018 [place] NEGATIVE 04/05/2018 1924   PROTEINUR NEGATIVE 04/05/2018 1924   UROBILINOGEN 0.2 06/20/2013 1832   NITRITE NEGATIVE 04/05/2018 1924   LEUKOCYTESUR NEGATIVE 04/05/2018 1924   Sepsis  Labs: @LABRCNTIP (procalcitonin:4,lacticidven:4) ) Recent Results (from the past 240 hour(s))  Blood Culture (routine x 2)     Status: None (Preliminary result)   Collection Time: 04/05/18  7:48 PM  Result Value Ref Range Status   Specimen Description BLOOD LEFT FOREARM  Final   Special Requests   Final    BOTTLES DRAWN AEROBIC AND ANAEROBIC Blood Culture adequate volume   Culture   Final    NO GROWTH < 12 HOURS Performed at [facility], [address], [place], [place] [address]    Report Status PENDING  Incomplete  Blood Culture (routine x 2)     Status: None (Preliminary result)   Collection Time: 04/05/18  7:57 PM  Result Value Ref Range Status   Specimen Description RIGHT ANTECUBITAL  Final   Special Requests   Final    BOTTLES DRAWN AEROBIC ONLY Blood Culture adequate volume   Culture   Final    NO GROWTH < 12 HOURS Performed at [facility], [address], [place], [place] [address]    Report Status PENDING  Incomplete     Scheduled Meds: . clopidogrel  75 mg Oral  Q breakfast  . docusate sodium  200 mg Oral QHS  . enoxaparin (LOVENOX) injection  40 mg Subcutaneous Q24H  . [START ON 04/07/2018] Influenza vac split quadrivalent PF  0.5 mL Intramuscular Tomorrow-1000  . insulin aspart  0-9 Units Subcutaneous TID WC  . latanoprost  1 drop Both Eyes QHS  . leflunomide  20 mg Oral Daily  . metoprolol succinate  25 mg Oral Daily  . pantoprazole  40 mg Oral Daily  . pravastatin  40 mg Oral QHS  . temazepam  30 mg Oral QHS  . timolol  1 drop Both Eyes BID   Continuous Infusions: . 0.9 % NaCl with KCl 20 mEq / L    . ciprofloxacin    . metronidazole 500 mg (04/06/18 0400)    Procedures/Studies: Ct Abdomen Pelvis W Contrast  Result Date: 04/05/2018 CLINICAL DATA:  Patient has bilateral lower abdominal sharp intermittent pain with nausea without vomiting. H/x diverticulitis patient states pain and symptoms similar to past. EXAM: CT ABDOMEN AND PELVIS WITH CONTRAST  TECHNIQUE: Multidetector CT imaging of the abdomen and pelvis was performed using the standard protocol following bolus administration of intravenous contrast. CONTRAST:  87mL ISOVUE-300 IOPAMIDOL (ISOVUE-300) INJECTION 61% COMPARISON:  02/06/2018 FINDINGS: Lower chest: There is scarring in both lung bases. Coronary artery calcifications are present. The heart is normal in size. Hepatobiliary: Liver is diffusely low attenuation. No focal liver lesions. Cholecystectomy. Pancreas: Unremarkable. No pancreatic ductal dilatation or surrounding inflammatory changes. Spleen: Normal in size without focal abnormality. Adrenals/Urinary Tract: Adrenal glands are normal. There is symmetric enhancement and excretion from the kidneys. 1 millimeter intrarenal calcification identified in the LOWER pole of the RIGHT kidney, not associated with obstruction. Ureters are unremarkable. The bladder and visualized portion of the urethra are normal. Stomach/Bowel: The stomach is normal in appearance. Small bowel loops are normal in caliber and wall thickness. The appendix is well seen and has a normal appearance. There is significant inflammatory change in the region of sigmoid colon and associated numerous diverticula in this segment. Fluid extends in the sigmoid mesentery. The diseased segment of sigmoid is adjacent to the uterus and the urinary bladder. No evidence for abscess or perforation. Vascular/Lymphatic: There is atherosclerotic calcification of the abdominal aorta. There is normal vascular opacification of the celiac axis, superior mesenteric artery, and inferior mesenteric artery. Normal appearance of the portal venous system and inferior vena cava. Reproductive: The uterus is present and is surrounded by inflammatory changes from the sigmoid diverticulitis. No adnexal mass. Other: Trace amount of free pelvic fluid. Musculoskeletal: Incompletely imaged fluid attenuation collection within the LEFT hip abductors, measuring at  least 7.3 x 4.8 centimeters. The appearance is stable since 2017. There are degenerative changes in the lumbar spine with 7 millimeters anterolisthesis of L4 on L5. Median sternotomy. IMPRESSION: 1. Acute sigmoid diverticulitis with significant fluid in the sigmoid mesentery. There is no evidence for perforation or abscess. 2. Coronary artery calcifications. 3. Hepatic steatosis.  Cholecystectomy. 4. Nonobstructing intrarenal calculus in the LOWER pole the RIGHT kidney. 5. Normal appendix. 6.  Aortic atherosclerosis. 7. Probably benign ganglion cyst within the LEFT hip abductors. Electronically Signed   By: Nolon Nations M.D.   On: 04/05/2018 22:06   US Arterial Abi (screening Lower Extremity)  Result Date: 03/19/2018 CLINICAL DATA:  72 year old female with chronic bilateral lower extremity pain EXAM: NONINVASIVE PHYSIOLOGIC VASCULAR STUDY OF BILATERAL LOWER EXTREMITIES TECHNIQUE: Evaluation of both lower extremities were performed at rest, including calculation of ankle-brachial indices with single  level Doppler, pressure and pulse volume recording. COMPARISON:  None. FINDINGS: Right ABI:  1.2 Left ABI:  1.1 Right Lower Extremity:  Normal arterial waveforms at the ankle. Left Lower Extremity:  Normal arterial waveforms at the ankle. 1.0-1.4 Normal IMPRESSION: Normal examination. No evidence of hemodynamically significant peripheral arterial disease. Signed, Criselda Peaches, MD, [facility] Radiology Electronically Signed   By: Jacqulynn Cadet M.D.   On: 03/19/2018 16:28    Orson Eva, DO  [facility] Pager [phone]  If 7PM-7AM, please contact night-coverage [email or web address] Password [email or web address] 04/06/2018, 8:44 AM   LOS: 0 days

## 2018-04-06 NOTE — Progress Notes (Signed)
Notified by tech that BS was 38, snack was immediately given by tech and re-checked with reading of 40. Dextrose administered, BS 116 within 10 mins. Will continue to monitor.

## 2018-04-06 NOTE — Progress Notes (Signed)
Pharmacy Antibiotic Note  [name] is a 72 y.o. female admitted on 04/05/2018 with intra-abdominal infection.  Pharmacy has been consulted for Cipro dosing.  Plan: Cipro 400 mg IV every 12 hours  Monitor labs, c/s, and patient improvement.  Weight: 260 lb 5.8 oz (118.1 kg)  Temp (24hrs), Avg:100.6 F (38.1 C), Min:99.5 F (37.5 C), Max:102.5 F (39.2 C)  Recent Labs  Lab 04/05/18 1948 04/05/18 1952  WBC 13.8*  --   CREATININE 1.43* 1.50*  LATICACIDVEN  --  1.06    Estimated Creatinine Clearance: 42.9 mL/min (A) (by C-G formula based on SCr of 1.5 mg/dL (H)).    Allergies  Allergen Reactions  . Fentanyl Anaphylaxis and Shortness Of Breath  . Lactose Intolerance (Gi) Other (See Comments)    G.I. Upset  . Butrans [Buprenorphine] Rash and Other (See Comments)    Infected skin underneath application  . Penicillins Rash    Facial rash Has patient had a PCN reaction causing immediate rash, facial/tongue/throat swelling, SOB or lightheadedness with hypotension: Yes Has patient had a PCN reaction causing severe rash involving mucus membranes or skin necrosis: No Has patient had a PCN reaction that required hospitalization No Has patient had a PCN reaction occurring within the last 10 years: Yes If all of the above answers are "NO", then may proceed with Cephalosporin use.   . Simvastatin Rash    Antimicrobials this admission: Cipro 10/26 >>  Flagyl 10/26 >>   Dose adjustments this admission: N/A  Microbiology results: 10/26 BCx: pending   Thank you for allowing pharmacy to be a part of this patient's care.  Ramond Craver 04/06/2018 7:33 AM

## 2018-04-06 NOTE — Progress Notes (Addendum)
Responded to nursing call:  hypotension   Subjective: Pt awake and lucid.  C/o abd pain a little better than yesterday.  Denies cp, vomiting.  Breathing comfortably currently.    Vitals:   04/05/18 2331 04/06/18 0604 04/06/18 0637 04/06/18 1109  BP: (!) 102/54 (!) 83/33 (!) 111/54 (!) 70/42  Pulse: 83 87 90 87  Resp: 14 18 18 16   Temp: 99.5 F (37.5 C) (!) 102.5 F (39.2 C) (!) 100.6 F (38.1 C) 99 F (37.2 C)  TempSrc: Oral Oral Oral Oral  SpO2: 99% 98% 97% 96%  Weight:   118.1 kg    CV--RRR Lung--CTA Abd--soft+BS/LLQ tender   Assessment/Plan: Hypotension -repeated BP--97/60 -give 1L bolus -broaden abx coverage pending culture data--d/c cipro/flagyl;  -start merrem -spouse updated at bedside     Orson Eva, DO [facility]

## 2018-04-07 DIAGNOSIS — I129 Hypertensive chronic kidney disease with stage 1 through stage 4 chronic kidney disease, or unspecified chronic kidney disease: Secondary | ICD-10-CM | POA: Diagnosis present

## 2018-04-07 DIAGNOSIS — T82868A Thrombosis of vascular prosthetic devices, implants and grafts, initial encounter: Secondary | ICD-10-CM | POA: Diagnosis not present

## 2018-04-07 DIAGNOSIS — L0291 Cutaneous abscess, unspecified: Secondary | ICD-10-CM | POA: Diagnosis not present

## 2018-04-07 DIAGNOSIS — E1169 Type 2 diabetes mellitus with other specified complication: Secondary | ICD-10-CM | POA: Diagnosis not present

## 2018-04-07 DIAGNOSIS — M069 Rheumatoid arthritis, unspecified: Secondary | ICD-10-CM | POA: Diagnosis present

## 2018-04-07 DIAGNOSIS — K5780 Diverticulitis of intestine, part unspecified, with perforation and abscess without bleeding: Secondary | ICD-10-CM | POA: Diagnosis not present

## 2018-04-07 DIAGNOSIS — K651 Peritoneal abscess: Secondary | ICD-10-CM | POA: Diagnosis not present

## 2018-04-07 DIAGNOSIS — I639 Cerebral infarction, unspecified: Secondary | ICD-10-CM | POA: Diagnosis not present

## 2018-04-07 DIAGNOSIS — K631 Perforation of intestine (nontraumatic): Secondary | ICD-10-CM

## 2018-04-07 DIAGNOSIS — Z1611 Resistance to penicillins: Secondary | ICD-10-CM | POA: Diagnosis present

## 2018-04-07 DIAGNOSIS — K5732 Diverticulitis of large intestine without perforation or abscess without bleeding: Secondary | ICD-10-CM | POA: Diagnosis present

## 2018-04-07 DIAGNOSIS — D509 Iron deficiency anemia, unspecified: Secondary | ICD-10-CM | POA: Diagnosis present

## 2018-04-07 DIAGNOSIS — K219 Gastro-esophageal reflux disease without esophagitis: Secondary | ICD-10-CM | POA: Diagnosis not present

## 2018-04-07 DIAGNOSIS — Z432 Encounter for attention to ileostomy: Secondary | ICD-10-CM | POA: Diagnosis not present

## 2018-04-07 DIAGNOSIS — R9431 Abnormal electrocardiogram [ECG] [EKG]: Secondary | ICD-10-CM | POA: Diagnosis not present

## 2018-04-07 DIAGNOSIS — N739 Female pelvic inflammatory disease, unspecified: Secondary | ICD-10-CM | POA: Diagnosis not present

## 2018-04-07 DIAGNOSIS — M797 Fibromyalgia: Secondary | ICD-10-CM | POA: Diagnosis present

## 2018-04-07 DIAGNOSIS — K5792 Diverticulitis of intestine, part unspecified, without perforation or abscess without bleeding: Secondary | ICD-10-CM | POA: Diagnosis present

## 2018-04-07 DIAGNOSIS — N179 Acute kidney failure, unspecified: Secondary | ICD-10-CM | POA: Diagnosis present

## 2018-04-07 DIAGNOSIS — R109 Unspecified abdominal pain: Secondary | ICD-10-CM | POA: Diagnosis not present

## 2018-04-07 DIAGNOSIS — D6489 Other specified anemias: Secondary | ICD-10-CM | POA: Diagnosis present

## 2018-04-07 DIAGNOSIS — E739 Lactose intolerance, unspecified: Secondary | ICD-10-CM | POA: Diagnosis not present

## 2018-04-07 DIAGNOSIS — I82621 Acute embolism and thrombosis of deep veins of right upper extremity: Secondary | ICD-10-CM | POA: Diagnosis not present

## 2018-04-07 DIAGNOSIS — M6281 Muscle weakness (generalized): Secondary | ICD-10-CM | POA: Diagnosis not present

## 2018-04-07 DIAGNOSIS — E11649 Type 2 diabetes mellitus with hypoglycemia without coma: Secondary | ICD-10-CM | POA: Diagnosis not present

## 2018-04-07 DIAGNOSIS — Z452 Encounter for adjustment and management of vascular access device: Secondary | ICD-10-CM | POA: Diagnosis not present

## 2018-04-07 DIAGNOSIS — Y832 Surgical operation with anastomosis, bypass or graft as the cause of abnormal reaction of the patient, or of later complication, without mention of misadventure at the time of the procedure: Secondary | ICD-10-CM | POA: Diagnosis not present

## 2018-04-07 DIAGNOSIS — N200 Calculus of kidney: Secondary | ICD-10-CM | POA: Diagnosis not present

## 2018-04-07 DIAGNOSIS — M2550 Pain in unspecified joint: Secondary | ICD-10-CM | POA: Diagnosis not present

## 2018-04-07 DIAGNOSIS — I739 Peripheral vascular disease, unspecified: Secondary | ICD-10-CM | POA: Diagnosis not present

## 2018-04-07 DIAGNOSIS — Z6841 Body Mass Index (BMI) 40.0 and over, adult: Secondary | ICD-10-CM | POA: Diagnosis not present

## 2018-04-07 DIAGNOSIS — I82409 Acute embolism and thrombosis of unspecified deep veins of unspecified lower extremity: Secondary | ICD-10-CM | POA: Diagnosis not present

## 2018-04-07 DIAGNOSIS — Z888 Allergy status to other drugs, medicaments and biological substances status: Secondary | ICD-10-CM | POA: Diagnosis not present

## 2018-04-07 DIAGNOSIS — F329 Major depressive disorder, single episode, unspecified: Secondary | ICD-10-CM | POA: Diagnosis present

## 2018-04-07 DIAGNOSIS — N183 Chronic kidney disease, stage 3 (moderate): Secondary | ICD-10-CM | POA: Diagnosis present

## 2018-04-07 DIAGNOSIS — D631 Anemia in chronic kidney disease: Secondary | ICD-10-CM | POA: Diagnosis present

## 2018-04-07 DIAGNOSIS — A419 Sepsis, unspecified organism: Secondary | ICD-10-CM | POA: Diagnosis present

## 2018-04-07 DIAGNOSIS — K5730 Diverticulosis of large intestine without perforation or abscess without bleeding: Secondary | ICD-10-CM | POA: Diagnosis not present

## 2018-04-07 DIAGNOSIS — I471 Supraventricular tachycardia: Secondary | ICD-10-CM | POA: Diagnosis present

## 2018-04-07 DIAGNOSIS — I2510 Atherosclerotic heart disease of native coronary artery without angina pectoris: Secondary | ICD-10-CM | POA: Diagnosis present

## 2018-04-07 DIAGNOSIS — Z48815 Encounter for surgical aftercare following surgery on the digestive system: Secondary | ICD-10-CM | POA: Diagnosis not present

## 2018-04-07 DIAGNOSIS — Z7401 Bed confinement status: Secondary | ICD-10-CM | POA: Diagnosis not present

## 2018-04-07 DIAGNOSIS — I82402 Acute embolism and thrombosis of unspecified deep veins of left lower extremity: Secondary | ICD-10-CM | POA: Diagnosis not present

## 2018-04-07 DIAGNOSIS — Z951 Presence of aortocoronary bypass graft: Secondary | ICD-10-CM | POA: Diagnosis not present

## 2018-04-07 DIAGNOSIS — Z23 Encounter for immunization: Secondary | ICD-10-CM | POA: Diagnosis present

## 2018-04-07 DIAGNOSIS — R160 Hepatomegaly, not elsewhere classified: Secondary | ICD-10-CM | POA: Diagnosis not present

## 2018-04-07 DIAGNOSIS — G4733 Obstructive sleep apnea (adult) (pediatric): Secondary | ICD-10-CM | POA: Diagnosis not present

## 2018-04-07 DIAGNOSIS — K660 Peritoneal adhesions (postprocedural) (postinfection): Secondary | ICD-10-CM | POA: Diagnosis not present

## 2018-04-07 DIAGNOSIS — E785 Hyperlipidemia, unspecified: Secondary | ICD-10-CM | POA: Diagnosis not present

## 2018-04-07 DIAGNOSIS — E1122 Type 2 diabetes mellitus with diabetic chronic kidney disease: Secondary | ICD-10-CM | POA: Diagnosis present

## 2018-04-07 DIAGNOSIS — R262 Difficulty in walking, not elsewhere classified: Secondary | ICD-10-CM | POA: Diagnosis not present

## 2018-04-07 DIAGNOSIS — I10 Essential (primary) hypertension: Secondary | ICD-10-CM | POA: Diagnosis not present

## 2018-04-07 DIAGNOSIS — K5720 Diverticulitis of large intestine with perforation and abscess without bleeding: Secondary | ICD-10-CM | POA: Diagnosis present

## 2018-04-07 DIAGNOSIS — E1121 Type 2 diabetes mellitus with diabetic nephropathy: Secondary | ICD-10-CM | POA: Diagnosis not present

## 2018-04-07 LAB — BASIC METABOLIC PANEL
ANION GAP: 9 (ref 5–15)
BUN: 33 mg/dL — ABNORMAL HIGH (ref 8–23)
CHLORIDE: 108 mmol/L (ref 98–111)
CO2: 21 mmol/L — ABNORMAL LOW (ref 22–32)
Calcium: 7.2 mg/dL — ABNORMAL LOW (ref 8.9–10.3)
Creatinine, Ser: 1.65 mg/dL — ABNORMAL HIGH (ref 0.44–1.00)
GFR calc Af Amer: 34 mL/min — ABNORMAL LOW (ref >60)
GFR calc non Af Amer: 30 mL/min — ABNORMAL LOW (ref >60)
GLUCOSE: 84 mg/dL (ref 70–99)
POTASSIUM: 3.9 mmol/L (ref 3.5–5.1)
Sodium: 138 mmol/L (ref 135–145)

## 2018-04-07 LAB — CBC
HEMATOCRIT: 25 % — AB (ref 36.0–46.0)
HEMOGLOBIN: 7.7 g/dL — AB (ref 12.0–15.0)
MCH: 30.6 pg (ref 26.0–34.0)
MCHC: 30.8 g/dL (ref 30.0–36.0)
MCV: 99.2 fL (ref 80.0–100.0)
Platelets: 246 10*3/uL (ref 150–400)
RBC: 2.52 MIL/uL — AB (ref 3.87–5.11)
RDW: 17 % — ABNORMAL HIGH (ref 11.5–15.5)
WBC: 13.6 10*3/uL — AB (ref 4.0–10.5)
nRBC: 0 % (ref 0.0–0.2)

## 2018-04-07 LAB — CORTISOL-PM, BLOOD: Cortisol - PM: 82.6 ug/dL — ABNORMAL HIGH (ref <10.0)

## 2018-04-07 LAB — PREPARE RBC (CROSSMATCH)

## 2018-04-07 LAB — PHOSPHORUS: Phosphorus: 3.5 mg/dL (ref 2.5–4.6)

## 2018-04-07 LAB — ABO/RH: ABO/RH(D): O POS

## 2018-04-07 LAB — MAGNESIUM: MAGNESIUM: 1.3 mg/dL — AB (ref 1.7–2.4)

## 2018-04-07 LAB — PROCALCITONIN: Procalcitonin: 3.23 ng/mL

## 2018-04-07 MED ORDER — SODIUM CHLORIDE 0.9% IV SOLUTION
Freq: Once | INTRAVENOUS | Status: AC
  Administered 2018-04-07: 12:00:00 via INTRAVENOUS

## 2018-04-07 MED ORDER — DEXTROSE 50 % IV SOLN
INTRAVENOUS | Status: AC
  Administered 2018-04-07: 25 mL
  Filled 2018-04-07: qty 50

## 2018-04-07 MED ORDER — MAGNESIUM SULFATE 2 GM/50ML IV SOLN
2.0000 g | Freq: Once | INTRAVENOUS | Status: AC
  Administered 2018-04-07: 2 g via INTRAVENOUS
  Filled 2018-04-07: qty 50

## 2018-04-07 NOTE — Consult Note (Signed)
Reason for Consult: Pneumoperitoneum Referring Physician: Dr. Norva Pavlov [name] is an 72 y.o. female.  HPI: Patient is a 72 year old white female with multiple medical problems who presented to the emergency room with worsening left lower quadrant abdominal pain which has been present over the past few weeks.  Patient has a history of diverticulosis.  She is status post colonoscopy in early September of this year.  She was admitted to the hospital on 04/05/2018 for treatment of her left lower quadrant abdominal pain.  Yesterday afternoon, the patient was noted to have worsening abdominal pain.  A chest x-ray was performed which revealed pneumoperitoneum.  CT scan of the abdomen shows increased free air under the left diaphragm.  No frank abscess is seen.  She does have a fluid collection in the mesentery of the sigmoid colon which has been present for many years.  The patient has multiple medical problems.  She has been on Plavix.  She states that her abdominal pain is not worse than yesterday.  She is very anxious.  Past Medical History:  Diagnosis Date  . Anginal pain [facility])    sees Dr Einar Gip.   . Arthritis    rheumatoid ..   . Diabetes mellitus without complication (HCC)    Metformin 2.3.2017  . Diverticulitis   . Dysrhythmia   . Fibromyalgia   . GERD (gastroesophageal reflux disease)   . High cholesterol   . Hypertension   . Liver disease 08- 2012   per Dr Dagmar Hait pt has enlarged liver  . Peripheral vascular disease (HCC)    legs  . Pneumonia 06/17/2016  . Sleep apnea    sleep study  oct 2012  . Stroke [facility]) 2011   [facility]      Past Surgical History:  Procedure Laterality Date  . [place]  . CHOLECYSTECTOMY  1996  . COLONOSCOPY N/A 02/13/2018   Procedure: COLONOSCOPY;  Surgeon: Rogene Houston, MD;  [facility];  Service: Endoscopy;  Laterality: N/A;  8:30  . CORONARY ARTERY BYPASS GRAFT  1986  . ESOPHAGEAL DILATION N/A 01/13/2018    Procedure: ESOPHAGEAL DILATION;  Surgeon: Rogene Houston, MD;  [facility];  Service: Endoscopy;  Laterality: N/A;  . ESOPHAGOGASTRODUODENOSCOPY N/A 01/13/2018   Procedure: ESOPHAGOGASTRODUODENOSCOPY (EGD);  Surgeon: Rogene Houston, MD;  [facility];  Service: Endoscopy;  Laterality: N/A;  . EYE SURGERY  2013   cat ext bilateral .  . EYE SURGERY  2002   laser  . FOREIGN BODY REMOVAL  02/13/2018   Procedure: FOREIGN BODY REMOVAL;  Surgeon: Rogene Houston, MD;  [facility];  Service: Endoscopy;;  foreign body removal which appears to be food debri in a stem form removed from colon by DR. Rehman  . [place]- 2007   rod right leg - right wrist plate  . GAS/FLUID EXCHANGE  03/25/2012   Procedure: GAS/FLUID EXCHANGE;  Surgeon: Hayden Pedro, MD;  Location: [place];  Service: Ophthalmology;  Laterality: Right;  . PARS PLANA VITRECTOMY  03/25/2012   Procedure: PARS PLANA VITRECTOMY WITH 25 GAUGE;  Surgeon: Hayden Pedro, MD;  Location: [place];  Service: Ophthalmology;  Laterality: Right;    Family History  Problem Relation Age of Onset  . Hypertension Daughter   . Rheum arthritis Maternal Grandmother     Social History:  reports that she quit smoking about 33 years ago. Her smoking use included  cigarettes. She has a 1.25 pack-year smoking history. She has never used smokeless tobacco. She reports that she drinks alcohol. She reports that she does not use drugs.  Allergies:  Allergies  Allergen Reactions  . Fentanyl Anaphylaxis and Shortness Of Breath  . Lactose Intolerance (Gi) Other (See Comments)    G.I. Upset  . Butrans [Buprenorphine] Rash and Other (See Comments)    Infected skin underneath application  . Penicillins Rash    Facial rash Has patient had a PCN reaction causing immediate rash, facial/tongue/throat swelling, SOB or lightheadedness with hypotension: Yes Has patient had a PCN reaction causing severe rash involving  mucus membranes or skin necrosis: No Has patient had a PCN reaction that required hospitalization No Has patient had a PCN reaction occurring within the last 10 years: Yes If all of the above answers are "NO", then may proceed with Cephalosporin use.   . Simvastatin Rash    Medications: I have reviewed the patient's current medications.  Results for orders placed or performed during the hospital encounter of 04/05/18 (from the past 48 hour(s))  Urinalysis, Routine w reflex microscopic     Status: None   Collection Time: 04/05/18  7:24 PM  Result Value Ref Range   Color, Urine YELLOW YELLOW   APPearance CLEAR CLEAR   Specific Gravity, Urine 1.021 1.005 - 1.030   pH 5.0 5.0 - 8.0   Glucose, UA NEGATIVE NEGATIVE mg/dL   Hgb urine dipstick NEGATIVE NEGATIVE   Bilirubin Urine NEGATIVE NEGATIVE   Ketones, ur NEGATIVE NEGATIVE mg/dL   Protein, ur NEGATIVE NEGATIVE mg/dL   Nitrite NEGATIVE NEGATIVE   Leukocytes, UA NEGATIVE NEGATIVE    Comment: Performed at [facility], [address], [place], [place] [address]  Comprehensive metabolic panel     Status: Abnormal   Collection Time: 04/05/18  7:48 PM  Result Value Ref Range   Sodium 136 135 - 145 mmol/L   Potassium 3.7 3.5 - 5.1 mmol/L   Chloride 102 98 - 111 mmol/L   CO2 26 22 - 32 mmol/L   Glucose, Bld 102 (H) 70 - 99 mg/dL   BUN 33 (H) 8 - 23 mg/dL   Creatinine, Ser 1.43 (H) 0.44 - 1.00 mg/dL   Calcium 8.7 (L) 8.9 - 10.3 mg/dL   Total Protein 6.9 6.5 - 8.1 g/dL   Albumin 3.3 (L) 3.5 - 5.0 g/dL   AST 75 (H) 15 - 41 U/L   ALT 43 0 - 44 U/L   Alkaline Phosphatase 59 38 - 126 U/L   Total Bilirubin 0.7 0.3 - 1.2 mg/dL   GFR calc non Af Amer 35 (L) >60 mL/min   GFR calc Af Amer 41 (L) >60 mL/min    Comment: (NOTE) The eGFR has been calculated using the CKD EPI equation. This calculation has not been validated in all clinical situations. eGFR's persistently <60 mL/min signify possible Chronic Kidney Disease.    Anion gap 8 5 -  15    Comment: Performed at [facility], [address], [place], [place] [address]  CBC WITH DIFFERENTIAL     Status: Abnormal   Collection Time: 04/05/18  7:48 PM  Result Value Ref Range   WBC 13.8 (H) 4.0 - 10.5 K/uL   RBC 3.27 (L) 3.87 - 5.11 MIL/uL   Hemoglobin 10.0 (L) 12.0 - 15.0 g/dL   HCT 31.9 (L) 36.0 - 46.0 %   MCV 97.6 80.0 - 100.0 fL   MCH 30.6 26.0 - 34.0 pg  MCHC 31.3 30.0 - 36.0 g/dL   RDW 16.8 (H) 11.5 - 15.5 %   Platelets 353 150 - 400 K/uL   nRBC 0.0 0.0 - 0.2 %   Neutrophils Relative % 92 %   Neutro Abs 12.7 (H) 1.7 - 7.7 K/uL   Lymphocytes Relative 3 %   Lymphs Abs 0.5 (L) 0.7 - 4.0 K/uL   Monocytes Relative 4 %   Monocytes Absolute 0.5 0.1 - 1.0 K/uL   Eosinophils Relative 1 %   Eosinophils Absolute 0.1 0.0 - 0.5 K/uL   Basophils Relative 0 %   Basophils Absolute 0.1 0.0 - 0.1 K/uL   Immature Granulocytes 0 %   Abs Immature Granulocytes 0.05 0.00 - 0.07 K/uL    Comment: Performed at [facility], [address] [place], [place] [address]  Blood Culture (routine x 2)     Status: None (Preliminary result)   Collection Time: 04/05/18  7:48 PM  Result Value Ref Range   Specimen Description BLOOD LEFT FOREARM    Special Requests      BOTTLES DRAWN AEROBIC AND ANAEROBIC Blood Culture adequate volume   Culture      NO GROWTH < 12 HOURS Performed at [facility], [address], [place], [place] [address]    Report Status PENDING   I-Stat CG4 Lactic Acid, ED  (not at  [facility])     Status: None   Collection Time: 04/05/18  7:52 PM  Result Value Ref Range   Lactic Acid, Venous 1.06 0.5 - 1.9 mmol/L  I-stat chem 8, ed     Status: Abnormal   Collection Time: 04/05/18  7:52 PM  Result Value Ref Range   Sodium 137 135 - 145 mmol/L   Potassium 3.8 3.5 - 5.1 mmol/L   Chloride 103 98 - 111 mmol/L   BUN 31 (H) 8 - 23 mg/dL   Creatinine, Ser 1.50 (H) 0.44 - 1.00 mg/dL   Glucose, Bld 101 (H) 70 - 99 mg/dL   Calcium, Ion 0.72 (LL) 1.15 - 1.40 mmol/L   TCO2 25 22 -  32 mmol/L   Hemoglobin 10.9 (L) 12.0 - 15.0 g/dL   HCT 32.0 (L) 36.0 - 46.0 %   Comment NOTIFIED PHYSICIAN   Blood Culture (routine x 2)     Status: None (Preliminary result)   Collection Time: 04/05/18  7:57 PM  Result Value Ref Range   Specimen Description RIGHT ANTECUBITAL    Special Requests      BOTTLES DRAWN AEROBIC ONLY Blood Culture adequate volume   Culture      NO GROWTH < 12 HOURS Performed at [facility], [address] [place], [place] [address]    Report Status PENDING   Glucose, capillary     Status: None   Collection Time: 04/05/18 11:46 PM  Result Value Ref Range   Glucose-Capillary 74 70 - 99 mg/dL   Comment 1 Notify RN    Comment 2 Document in Chart   Glucose, capillary     Status: Abnormal   Collection Time: 04/06/18  3:54 AM  Result Value Ref Range   Glucose-Capillary 38 (LL) 70 - 99 mg/dL   Comment 1 Notify RN    Comment 2 Document in Chart   Glucose, capillary     Status: Abnormal   Collection Time: 04/06/18  4:13 AM  Result Value Ref Range   Glucose-Capillary 40 (LL) 70 - 99 mg/dL   Comment 1 Notify RN    Comment 2 Document in  Chart   Glucose, capillary     Status: Abnormal   Collection Time: 04/06/18  4:23 AM  Result Value Ref Range   Glucose-Capillary 116 (H) 70 - 99 mg/dL   Comment 1 Notify RN    Comment 2 Document in Chart   Comprehensive metabolic panel     Status: Abnormal   Collection Time: 04/06/18  6:25 AM  Result Value Ref Range   Sodium 136 135 - 145 mmol/L   Potassium 3.4 (L) 3.5 - 5.1 mmol/L   Chloride 104 98 - 111 mmol/L   CO2 25 22 - 32 mmol/L   Glucose, Bld 106 (H) 70 - 99 mg/dL   BUN 28 (H) 8 - 23 mg/dL   Creatinine, Ser 1.38 (H) 0.44 - 1.00 mg/dL   Calcium 7.8 (L) 8.9 - 10.3 mg/dL   Total Protein 5.8 (L) 6.5 - 8.1 g/dL   Albumin 2.7 (L) 3.5 - 5.0 g/dL   AST 58 (H) 15 - 41 U/L   ALT 38 0 - 44 U/L   Alkaline Phosphatase 45 38 - 126 U/L   Total Bilirubin 0.6 0.3 - 1.2 mg/dL   GFR calc non Af Amer 37 (L) >60 mL/min   GFR  calc Af Amer 43 (L) >60 mL/min    Comment: (NOTE) The eGFR has been calculated using the CKD EPI equation. This calculation has not been validated in all clinical situations. eGFR's persistently <60 mL/min signify possible Chronic Kidney Disease.    Anion gap 7 5 - 15    Comment: Performed at [facility], [address], [place], [place] [address]  CBC     Status: Abnormal   Collection Time: 04/06/18  6:25 AM  Result Value Ref Range   WBC 11.0 (H) 4.0 - 10.5 K/uL   RBC 2.73 (L) 3.87 - 5.11 MIL/uL   Hemoglobin 8.3 (L) 12.0 - 15.0 g/dL   HCT 27.0 (L) 36.0 - 46.0 %   MCV 98.9 80.0 - 100.0 fL   MCH 30.4 26.0 - 34.0 pg   MCHC 30.7 30.0 - 36.0 g/dL   RDW 16.9 (H) 11.5 - 15.5 %   Platelets 291 150 - 400 K/uL   nRBC 0.0 0.0 - 0.2 %    Comment: Performed at [facility], [address] [place], [place] [address]  Glucose, capillary     Status: None   Collection Time: 04/06/18  7:30 AM  Result Value Ref Range   Glucose-Capillary 95 70 - 99 mg/dL  Glucose, capillary     Status: Abnormal   Collection Time: 04/06/18 11:19 AM  Result Value Ref Range   Glucose-Capillary 69 (L) 70 - 99 mg/dL  Glucose, capillary     Status: None   Collection Time: 04/06/18 12:06 PM  Result Value Ref Range   Glucose-Capillary 90 70 - 99 mg/dL  Glucose, capillary     Status: Abnormal   Collection Time: 04/06/18  4:23 PM  Result Value Ref Range   Glucose-Capillary 64 (L) 70 - 99 mg/dL  Glucose, capillary     Status: Abnormal   Collection Time: 04/06/18  5:28 PM  Result Value Ref Range   Glucose-Capillary 103 (H) 70 - 99 mg/dL  Lactic acid, plasma     Status: None   Collection Time: 04/06/18  7:51 PM  Result Value Ref Range   Lactic Acid, Venous 1.1 0.5 - 1.9 mmol/L    Comment: Performed at [facility], [address] [place], [place] [address]  Basic metabolic panel  Status: Abnormal   Collection Time: 04/06/18  7:51 PM  Result Value Ref Range   Sodium 136 135 - 145 mmol/L   Potassium 3.5 3.5 -  5.1 mmol/L   Chloride 105 98 - 111 mmol/L   CO2 23 22 - 32 mmol/L   Glucose, Bld 88 70 - 99 mg/dL   BUN 30 (H) 8 - 23 mg/dL   Creatinine, Ser 1.81 (H) 0.44 - 1.00 mg/dL   Calcium 7.1 (L) 8.9 - 10.3 mg/dL   GFR calc non Af Amer 27 (L) >60 mL/min   GFR calc Af Amer 31 (L) >60 mL/min    Comment: (NOTE) The eGFR has been calculated using the CKD EPI equation. This calculation has not been validated in all clinical situations. eGFR's persistently <60 mL/min signify possible Chronic Kidney Disease.    Anion gap 8 5 - 15    Comment: Performed at [facility], [address], [place], [place] [address]  CBC     Status: Abnormal   Collection Time: 04/06/18  7:51 PM  Result Value Ref Range   WBC 13.2 (H) 4.0 - 10.5 K/uL   RBC 2.59 (L) 3.87 - 5.11 MIL/uL   Hemoglobin 7.7 (L) 12.0 - 15.0 g/dL   HCT 25.7 (L) 36.0 - 46.0 %   MCV 99.2 80.0 - 100.0 fL   MCH 29.7 26.0 - 34.0 pg   MCHC 30.0 30.0 - 36.0 g/dL   RDW 17.2 (H) 11.5 - 15.5 %   Platelets 257 150 - 400 K/uL   nRBC 0.0 0.0 - 0.2 %    Comment: Performed at [facility], [address], [place], [place] [address]  Procalcitonin - Baseline     Status: None   Collection Time: 04/06/18  7:51 PM  Result Value Ref Range   Procalcitonin 3.25 ng/mL    Comment:        Interpretation: PCT > 2 ng/mL: Systemic infection (sepsis) is likely, unless other causes are known. (NOTE)       Sepsis PCT Algorithm           Lower Respiratory Tract                                      Infection PCT Algorithm    ----------------------------     ----------------------------         PCT < 0.25 ng/mL                PCT < 0.10 ng/mL         Strongly encourage             Strongly discourage   discontinuation of antibiotics    initiation of antibiotics    ----------------------------     -----------------------------       PCT 0.25 - 0.50 ng/mL            PCT 0.10 - 0.25 ng/mL               OR       >80% decrease in PCT            Discourage initiation of                                             antibiotics  Encourage discontinuation           of antibiotics    ----------------------------     -----------------------------         PCT >= 0.50 ng/mL              PCT 0.26 - 0.50 ng/mL               AND       <80% decrease in PCT              Encourage initiation of                                             antibiotics       Encourage continuation           of antibiotics    ----------------------------     -----------------------------        PCT >= 0.50 ng/mL                  PCT > 0.50 ng/mL               AND         increase in PCT                  Strongly encourage                                      initiation of antibiotics    Strongly encourage escalation           of antibiotics                                     -----------------------------                                           PCT <= 0.25 ng/mL                                                 OR                                        > 80% decrease in PCT                                     Discontinue / Do not initiate                                             antibiotics Performed at [facility], [address] [place], [place] [address]   APTT     Status: None   Collection Time: 04/06/18  7:52 PM  Result Value Ref Range  aPTT 33 24 - 36 seconds    Comment: Performed at [facility], [address] [place], [place] [address]  Protime-INR     Status: Abnormal   Collection Time: 04/06/18  7:52 PM  Result Value Ref Range   Prothrombin Time 15.3 (H) 11.4 - 15.2 seconds   INR 1.22     Comment: Performed at [facility], [address] [place], [place] [address]  Lactic acid, plasma     Status: None   Collection Time: 04/06/18 11:08 PM  Result Value Ref Range   Lactic Acid, Venous 1.0 0.5 - 1.9 mmol/L    Comment: Performed at [facility], [address], [place], [place] [address]  Basic metabolic panel     Status: Abnormal   Collection Time: 04/07/18   4:50 AM  Result Value Ref Range   Sodium 138 135 - 145 mmol/L   Potassium 3.9 3.5 - 5.1 mmol/L   Chloride 108 98 - 111 mmol/L   CO2 21 (L) 22 - 32 mmol/L   Glucose, Bld 84 70 - 99 mg/dL   BUN 33 (H) 8 - 23 mg/dL   Creatinine, Ser 1.65 (H) 0.44 - 1.00 mg/dL   Calcium 7.2 (L) 8.9 - 10.3 mg/dL   GFR calc non Af Amer 30 (L) >60 mL/min   GFR calc Af Amer 34 (L) >60 mL/min    Comment: (NOTE) The eGFR has been calculated using the CKD EPI equation. This calculation has not been validated in all clinical situations. eGFR's persistently <60 mL/min signify possible Chronic Kidney Disease.    Anion gap 9 5 - 15    Comment: Performed at [facility], [address] [place], [place] [address]  CBC     Status: Abnormal   Collection Time: 04/07/18  4:50 AM  Result Value Ref Range   WBC 13.6 (H) 4.0 - 10.5 K/uL   RBC 2.52 (L) 3.87 - 5.11 MIL/uL   Hemoglobin 7.7 (L) 12.0 - 15.0 g/dL   HCT 25.0 (L) 36.0 - 46.0 %   MCV 99.2 80.0 - 100.0 fL   MCH 30.6 26.0 - 34.0 pg   MCHC 30.8 30.0 - 36.0 g/dL   RDW 17.0 (H) 11.5 - 15.5 %   Platelets 246 150 - 400 K/uL   nRBC 0.0 0.0 - 0.2 %    Comment: Performed at [facility], [address], [place], [place] [address]  Magnesium     Status: Abnormal   Collection Time: 04/07/18  4:50 AM  Result Value Ref Range   Magnesium 1.3 (L) 1.7 - 2.4 mg/dL    Comment: Performed at [facility], [address], [place], [place] [address]  Procalcitonin     Status: None   Collection Time: 04/07/18  4:50 AM  Result Value Ref Range   Procalcitonin 3.23 ng/mL    Comment:        Interpretation: PCT > 2 ng/mL: Systemic infection (sepsis) is likely, unless other causes are known. (NOTE)       Sepsis PCT Algorithm           Lower Respiratory Tract                                      Infection PCT Algorithm    ----------------------------     ----------------------------         PCT < 0.25 ng/mL  PCT < 0.10 ng/mL         Strongly encourage              Strongly discourage   discontinuation of antibiotics    initiation of antibiotics    ----------------------------     -----------------------------       PCT 0.25 - 0.50 ng/mL            PCT 0.10 - 0.25 ng/mL               OR       >80% decrease in PCT            Discourage initiation of                                            antibiotics      Encourage discontinuation           of antibiotics    ----------------------------     -----------------------------         PCT >= 0.50 ng/mL              PCT 0.26 - 0.50 ng/mL               AND       <80% decrease in PCT              Encourage initiation of                                             antibiotics       Encourage continuation           of antibiotics    ----------------------------     -----------------------------        PCT >= 0.50 ng/mL                  PCT > 0.50 ng/mL               AND         increase in PCT                  Strongly encourage                                      initiation of antibiotics    Strongly encourage escalation           of antibiotics                                     -----------------------------                                           PCT <= 0.25 ng/mL                                                 OR                                        >  80% decrease in PCT                                     Discontinue / Do not initiate                                             antibiotics Performed at [facility], [address], [place], [place] [address]     Ct Abdomen Pelvis Wo Contrast  Result Date: 04/06/2018 CLINICAL DATA:  72 year old female with concern for perforated diverticulitis. EXAM: CT ABDOMEN AND PELVIS WITHOUT CONTRAST TECHNIQUE: Multidetector CT imaging of the abdomen and pelvis was performed following the standard protocol without IV contrast. COMPARISON:  Chest radiograph dated 04/06/2018 and CT dated 04/05/2018 FINDINGS: Evaluation of this exam is limited in the  absence of intravenous contrast. Lower chest: Diffuse interstitial coarsening and streaky atelectasis/scarring of the lung bases. There is mild cardiomegaly. There is hypoattenuation of the cardiac blood pool suggestive of a degree of anemia. Clinical correlation is recommended. There is pneumoperitoneum and small amount of free fluid within the pelvis. Hepatobiliary: Advanced fatty infiltration of the liver. No intrahepatic biliary ductal dilatation. Cholecystectomy. Pancreas: Unremarkable. No pancreatic ductal dilatation or surrounding inflammatory changes. Spleen: Normal in size without focal abnormality. Adrenals/Urinary Tract: The adrenal glands are unremarkable. There is no hydronephrosis on either side. Excreted contrast in the renal collecting systems bilaterally. The visualized ureters appear unremarkable. There is trabeculated appearance of the bladder wall likely related to chronic bladder dysfunction. Correlation with urinalysis recommended to exclude cystitis. Stomach/Bowel: There is extensive sigmoid diverticulosis. There is associated inflammatory changes and perforation of the sigmoid colon. There is increased size of the extraluminal air since the prior CT. No drainable fluid collection is noted at this time. There is no bowel obstruction. Normal appendix. Vascular/Lymphatic: There is advanced aortoiliac atherosclerotic disease. No portal venous gas. There is no adenopathy. Reproductive: The uterus is grossly unremarkable. Other: None Musculoskeletal: Degenerative changes of the spine. No acute osseous pathology. IMPRESSION: 1. Perforated sigmoid diverticulitis with increased size of the extraluminal air since the prior CT. No drainable fluid collection noted at this time. 2. No bowel obstruction. Normal appendix. 3. Advanced fatty infiltration of the liver. These results were called by telephone at the time of interpretation on 04/06/2018 at 11:09 pm to nurse Hearn, who verbally acknowledged these  results. Electronically Signed   By: Anner Crete M.D.   On: 04/06/2018 23:25   Dg Chest 2 View  Result Date: 04/06/2018 CLINICAL DATA:  Dyspnea, sepsis, pt listless and unable to cooperate EXAM: CHEST - 2 VIEW COMPARISON:  06/27/2016 and 06/18/2016. FINDINGS: On the lateral view, there is evidence of a small amount free intraperitoneal air under the left hemidiaphragm. Lung volumes are low. There is opacity at the bases that is likely due to atelectasis along with bronchovascular crowding. No convincing pneumonia and no evidence of pulmonary edema. 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 Cardiac silhouette is normal in size. No mediastinal hilar masses. No pleural effusion.  No pneumothorax. Skeletal structures are grossly intact. IMPRESSION: 1. Small amount of free intraperitoneal air. Recommend follow-up abdomen and pelvis CT with contrast for further assessment. 2. Somewhat limited study due to low lung volumes. Allowing for this, no acute cardiopulmonary disease. Electronically Signed   By: Lajean Manes  M.D.   On: 04/06/2018 11:23   Ct Abdomen Pelvis W Contrast  Result Date: 04/05/2018 CLINICAL DATA:  Patient has bilateral lower abdominal sharp intermittent pain with nausea without vomiting. H/x diverticulitis patient states pain and symptoms similar to past. EXAM: CT ABDOMEN AND PELVIS WITH CONTRAST TECHNIQUE: Multidetector CT imaging of the abdomen and pelvis was performed using the standard protocol following bolus administration of intravenous contrast. CONTRAST:  101m ISOVUE-300 IOPAMIDOL (ISOVUE-300) INJECTION 61% COMPARISON:  02/06/2018 FINDINGS: Lower chest: There is scarring in both lung bases. Coronary artery calcifications are present. The heart is normal in size. Hepatobiliary: Liver is diffusely low attenuation. No focal liver lesions. Cholecystectomy. Pancreas: Unremarkable. No pancreatic ductal dilatation or surrounding inflammatory changes. Spleen: Normal in size without focal  abnormality. Adrenals/Urinary Tract: Adrenal glands are normal. There is symmetric enhancement and excretion from the kidneys. 1 millimeter intrarenal calcification identified in the LOWER pole of the RIGHT kidney, not associated with obstruction. Ureters are unremarkable. The bladder and visualized portion of the urethra are normal. Stomach/Bowel: The stomach is normal in appearance. Small bowel loops are normal in caliber and wall thickness. The appendix is well seen and has a normal appearance. There is significant inflammatory change in the region of sigmoid colon and associated numerous diverticula in this segment. Fluid extends in the sigmoid mesentery. The diseased segment of sigmoid is adjacent to the uterus and the urinary bladder. No evidence for abscess or perforation. Vascular/Lymphatic: There is atherosclerotic calcification of the abdominal aorta. There is normal vascular opacification of the celiac axis, superior mesenteric artery, and inferior mesenteric artery. Normal appearance of the portal venous system and inferior vena cava. Reproductive: The uterus is present and is surrounded by inflammatory changes from the sigmoid diverticulitis. No adnexal mass. Other: Trace amount of free pelvic fluid. Musculoskeletal: Incompletely imaged fluid attenuation collection within the LEFT hip abductors, measuring at least 7.3 x 4.8 centimeters. The appearance is stable since 2017. There are degenerative changes in the lumbar spine with 7 millimeters anterolisthesis of L4 on L5. Median sternotomy. IMPRESSION: 1. Acute sigmoid diverticulitis with significant fluid in the sigmoid mesentery. There is no evidence for perforation or abscess. 2. Coronary artery calcifications. 3. Hepatic steatosis.  Cholecystectomy. 4. Nonobstructing intrarenal calculus in the LOWER pole the RIGHT kidney. 5. Normal appendix. 6.  Aortic atherosclerosis. 7. Probably benign ganglion cyst within the LEFT hip abductors. Electronically  Signed   By: ENolon NationsM.D.   On: 04/05/2018 22:06    ROS:  Pertinent items are noted in HPI.  Blood pressure 113/60, pulse 74, temperature (!) 97.5 F (36.4 C), resp. rate 12, weight 118.1 kg, SpO2 98 %. Physical Exam: Anxious white female who is well-developed well-nourished Head is normocephalic, atraumatic Lungs clear to auscultation with good breath sounds bilaterally Heart examination reveals regular rate and rhythm without S3, S4, murmurs Abdomen is soft with tenderness noted primarily in left lower quadrant and suprapubic region.  She has referred pain when palpating the right side of her abdomen.  She is obese in nature.  She has no rigidity.  Minimal bowel sounds appreciated.  CT scan images personally reviewed Labs reviewed.  Hypomagnesemia present.  Hematocrit 25.  Platelet count within normal limits.  Assessment/Plan: Impression: Pneumoperitoneum secondary to perforated sigmoid diverticulitis.  Patient at high risk for surgical intervention given her multiple medical problems, Plavix use, and anemia.  Should surgery be required, there is a high likelihood of needing a colostomy.  At this point, she does not have frank peritonitis.  Will perform serial exams to see how she progresses.  I have ordered 2 units of packed red blood cells.  Stop her Plavix.  Her hypomagnesemia will be treated.  This was discussed with Dr. Carles Collet.  Aviva Signs 04/07/2018, 7:27 AM

## 2018-04-07 NOTE — Progress Notes (Addendum)
PROGRESS NOTE  [name] MWN:[id number] DOB: [birth year] DOA: 04/05/2018 PCP: [name], MD  Brief History:  72 year old female with a history of coronary artery disease, hyperlipidemia, stroke, hypertension, diabetes mellitus, rheumatoid arthritis, fibromyalgia/chronic pain presenting with 2-week history of lower abdominal pain.  The patient states that has progressively worsened.  She felt that she may have had some UTI type symptoms and contacted her primary care provider on 04/03/2018.  The patient was prescribed antibiotic the name of which she does not remember.  She began taking antibiotics on the evening of 04/03/2018.  Her abdominal pain persisted.  As result, the patient presented for further evaluation.  Patient had complained of fevers and chills up to 103.0 F at home.  She denied any headache, neck pain, chest pain, coughing, vomiting, diarrhea, hematochezia, melena, dysuria, hematuria.  She has chronic shortness of breath which she states has not worsened.  Patient has been using hydrocodone for her abdominal pain.  She has been receiving hydrocodone from her primary care provider for her rheumatoid arthritis and fibromyalgia.  Upon presentation, the patient was noted to have WBC 13.8 with a fever 102.5 F.  CT of the abdomen and pelvis showed acute sigmoid diverticulitis with significant fluid around the sigmoid mesentery.  There was no perforation or abscess.  The patient was started on Cipro and Flagyl and IV fluids initially.  On the afternoon of 04/06/2018, the patient became progressively hypotensive.  Chest x-ray was obtained at that time and suggested possible free air under the diaphragm.  Repeat CT of the abdomen and pelvis was obtained, and the patient's antibiotic coverage was broadened to meropenem.  The patient was transferred to the stepdown unit.  Subsequently, repeat CT of the abdomen and pelvis showed increasing pneumoperitoneum.  General surgery was  consulted.  Assessment/Plan: Sepsis -Present at the time of admission -secondary to perforated diverticulitis -Continue IV fluids--increased rate to 100 cc/h -Broaden abx coverage to Merrem -Lactic acid 1.1 -Follow blood cultures--suspect they may be compromised as the patient was on outpatient antibiotics prior to admission -04/05/18 UA--no pyuria -PICC placed 04/06/2018 -Continue Solu-Cortef -Procalcitonin peaked 3.25  Perforated sigmoid diverticulitis -Continue Merrem -04/06/2018 CT abdomen--diffuse interstitial coarsening.  Pneumoperitoneum present.  Diverticulitis with inflammatory changes and perforation of the sigmoid colon.  Increased extraluminal air. -NPO for possible surgery -10/28--case discussed with general surgery--Dr. Arnoldo Morale -Judicious IV Dilaudid  CKD stage III -Baseline creatinine 1.2-1.5 -A.m. BMP  Essential hypertension -Holding amlodipine, ARB, and HCTZ secondary to hypotension and soft blood pressure -Holding metoprolol succinate due to hypotension   Diabetes mellitus type 2 -Holding glipizide -Hemoglobin A1c -NovoLog sliding scale  Coronary artery disease -No chest pain presently -Continue Plavix -Continue metoprolol succinate as blood pressure allows  Rheumatoid arthritis -Continue Arava -pt follows Dr. Amil Amen in outpatient setting  Hyperlipidemia -restart statin once able to tolerate po  Chronic pain syndrome/Fibromyalgia -continue home dose hydrocodone when able to tolerate po    Disposition Plan:   remain in stepdown Family Communication:   Family at bedside 10/28--The patient is critically ill with multiple organ systems failure and requires high complexity decision making for assessment and support, frequent evaluation and titration of therapies, application of advanced monitoring technologies and extensive interpretation of multiple databases.  Critical care time - 35 mins.    Consultants:  general surgery  Code  Status:  FULL  DVT Prophylaxis:  holding lovenox for surgery   Procedures: As Listed in Progress Note  Above  Antibiotics: cipro 10/26>>>10/27 Flagyl 10/26>>>10/27 Merrem 10/27>>>       Subjective: Patient continues to complain of abdominal pain about the same as yesterday.  She denies any vomiting, diarrhea, chest pain, shortness of breath, dysuria, hematuria.  She denies any fevers, chills, headache, neck pain.  There is no emesis.  Objective: Vitals:   04/07/18 0600 04/07/18 0615 04/07/18 0630 04/07/18 0645  BP: (!) 90/56 (!) 104/53 (!) 100/54 113/60  Pulse: 73 74 73 74  Resp: 11 12 12 12   Temp:      TempSrc:      SpO2: 98% 98% 96% 98%  Weight:        Intake/Output Summary (Last 24 hours) at 04/07/2018 0738 Last data filed at 04/07/2018 0651 Gross per 24 hour  Intake 3483.09 ml  Output -  Net 3483.09 ml   Weight change:  Exam:   General:  Pt is alert, follows commands appropriately, not in acute distress  HEENT: No icterus, No thrush, No neck mass, [place]/AT  Cardiovascular: RRR, S1/S2, no rubs, no gallops  Respiratory: Bibasilar crackles but no wheeze.  Good air movement.  Abdomen: Soft/+BS, generalized tender, non distended, + peritoneal signs  Extremities: No edema, No lymphangitis, No petechiae, No rashes, no synovitis   Data Reviewed: I have personally reviewed following labs and imaging studies Basic Metabolic Panel: Recent Labs  Lab 04/05/18 1948 04/05/18 1952 04/06/18 0625 04/06/18 1951 04/07/18 0450  NA 136 137 136 136 138  K 3.7 3.8 3.4* 3.5 3.9  CL 102 103 104 105 108  CO2 26  --  25 23 21*  GLUCOSE 102* 101* 106* 88 84  BUN 33* 31* 28* 30* 33*  CREATININE 1.43* 1.50* 1.38* 1.81* 1.65*  CALCIUM 8.7*  --  7.8* 7.1* 7.2*  MG  --   --   --   --  1.3*   Liver Function Tests: Recent Labs  Lab 04/05/18 1948 04/06/18 0625  AST 75* 58*  ALT 43 38  ALKPHOS 59 45  BILITOT 0.7 0.6  PROT 6.9 5.8*  ALBUMIN 3.3* 2.7*   No results  for input(s): LIPASE, AMYLASE in the last 168 hours. No results for input(s): AMMONIA in the last 168 hours. Coagulation Profile: Recent Labs  Lab 04/06/18 1952  INR 1.22   CBC: Recent Labs  Lab 04/05/18 1948 04/05/18 1952 04/06/18 0625 04/06/18 1951 04/07/18 0450  WBC 13.8*  --  11.0* 13.2* 13.6*  NEUTROABS 12.7*  --   --   --   --   HGB 10.0* 10.9* 8.3* 7.7* 7.7*  HCT 31.9* 32.0* 27.0* 25.7* 25.0*  MCV 97.6  --  98.9 99.2 99.2  PLT 353  --  291 257 246   Cardiac Enzymes: No results for input(s): CKTOTAL, CKMB, CKMBINDEX, TROPONINI in the last 168 hours. BNP: Invalid input(s): POCBNP CBG: Recent Labs  Lab 04/06/18 0730 04/06/18 1119 04/06/18 1206 04/06/18 1623 04/06/18 1728  GLUCAP 95 69* 90 64* 103*   HbA1C: No results for input(s): HGBA1C in the last 72 hours. Urine analysis:    Component Value Date/Time   COLORURINE YELLOW 04/05/2018 1924   APPEARANCEUR CLEAR 04/05/2018 1924   LABSPEC 1.021 04/05/2018 1924   PHURINE 5.0 04/05/2018 1924   GLUCOSEU NEGATIVE 04/05/2018 1924   HGBUR NEGATIVE 04/05/2018 1924   BILIRUBINUR NEGATIVE 04/05/2018 1924   KETONESUR NEGATIVE 04/05/2018 1924   PROTEINUR NEGATIVE 04/05/2018 1924   UROBILINOGEN 0.2 06/20/2013 1832   NITRITE NEGATIVE 04/05/2018 1924   LEUKOCYTESUR NEGATIVE  04/05/2018 1924   Sepsis Labs: @LABRCNTIP (procalcitonin:4,lacticidven:4) ) Recent Results (from the past 240 hour(s))  Blood Culture (routine x 2)     Status: None (Preliminary result)   Collection Time: 04/05/18  7:48 PM  Result Value Ref Range Status   Specimen Description BLOOD LEFT FOREARM  Final   Special Requests   Final    BOTTLES DRAWN AEROBIC AND ANAEROBIC Blood Culture adequate volume   Culture   Final    NO GROWTH < 12 HOURS Performed at [facility], [address] [place], [place] [address]    Report Status PENDING  Incomplete  Blood Culture (routine x 2)     Status: None (Preliminary result)   Collection Time: 04/05/18  7:57  PM  Result Value Ref Range Status   Specimen Description RIGHT ANTECUBITAL  Final   Special Requests   Final    BOTTLES DRAWN AEROBIC ONLY Blood Culture adequate volume   Culture   Final    NO GROWTH < 12 HOURS Performed at [facility], [address], [place], [place] [address]    Report Status PENDING  Incomplete     Scheduled Meds: . sodium chloride   Intravenous Once  . Chlorhexidine Gluconate Cloth  6 each Topical Daily  . docusate sodium  200 mg Oral QHS  . hydrocortisone sod succinate (SOLU-CORTEF) inj  100 mg Intravenous Q8H  . Influenza vac split quadrivalent PF  0.5 mL Intramuscular Tomorrow-1000  . latanoprost  1 drop Both Eyes QHS  . leflunomide  20 mg Oral Daily  . pantoprazole  40 mg Oral Daily  . pravastatin  40 mg Oral QHS  . sodium chloride flush  10-40 mL Intracatheter Q12H  . temazepam  30 mg Oral QHS  . timolol  1 drop Both Eyes BID   Continuous Infusions: . 0.9 % NaCl with KCl 20 mEq / L 100 mL/hr at 04/07/18 0651  . magnesium sulfate 1 - 4 g bolus IVPB    . meropenem (MERREM) IV Stopped (04/06/18 2317)    Procedures/Studies: Ct Abdomen Pelvis Wo Contrast  Result Date: 04/06/2018 CLINICAL DATA:  72 year old female with concern for perforated diverticulitis. EXAM: CT ABDOMEN AND PELVIS WITHOUT CONTRAST TECHNIQUE: Multidetector CT imaging of the abdomen and pelvis was performed following the standard protocol without IV contrast. COMPARISON:  Chest radiograph dated 04/06/2018 and CT dated 04/05/2018 FINDINGS: Evaluation of this exam is limited in the absence of intravenous contrast. Lower chest: Diffuse interstitial coarsening and streaky atelectasis/scarring of the lung bases. There is mild cardiomegaly. There is hypoattenuation of the cardiac blood pool suggestive of a degree of anemia. Clinical correlation is recommended. There is pneumoperitoneum and small amount of free fluid within the pelvis. Hepatobiliary: Advanced fatty infiltration of the liver. No  intrahepatic biliary ductal dilatation. Cholecystectomy. Pancreas: Unremarkable. No pancreatic ductal dilatation or surrounding inflammatory changes. Spleen: Normal in size without focal abnormality. Adrenals/Urinary Tract: The adrenal glands are unremarkable. There is no hydronephrosis on either side. Excreted contrast in the renal collecting systems bilaterally. The visualized ureters appear unremarkable. There is trabeculated appearance of the bladder wall likely related to chronic bladder dysfunction. Correlation with urinalysis recommended to exclude cystitis. Stomach/Bowel: There is extensive sigmoid diverticulosis. There is associated inflammatory changes and perforation of the sigmoid colon. There is increased size of the extraluminal air since the prior CT. No drainable fluid collection is noted at this time. There is no bowel obstruction. Normal appendix. Vascular/Lymphatic: There is advanced aortoiliac atherosclerotic disease. No portal venous gas. There  is no adenopathy. Reproductive: The uterus is grossly unremarkable. Other: None Musculoskeletal: Degenerative changes of the spine. No acute osseous pathology. IMPRESSION: 1. Perforated sigmoid diverticulitis with increased size of the extraluminal air since the prior CT. No drainable fluid collection noted at this time. 2. No bowel obstruction. Normal appendix. 3. Advanced fatty infiltration of the liver. These results were called by telephone at the time of interpretation on 04/06/2018 at 11:09 pm to nurse Hearn, who verbally acknowledged these results. Electronically Signed   By: Anner Crete M.D.   On: 04/06/2018 23:25   Dg Chest 2 View  Result Date: 04/06/2018 CLINICAL DATA:  Dyspnea, sepsis, pt listless and unable to cooperate EXAM: CHEST - 2 VIEW COMPARISON:  06/27/2016 and 06/18/2016. FINDINGS: On the lateral view, there is evidence of a small amount free intraperitoneal air under the left hemidiaphragm. Lung volumes are low. There is  opacity at the bases that is likely due to atelectasis along with bronchovascular crowding. No convincing pneumonia and no evidence of pulmonary edema. 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 Cardiac silhouette is normal in size. No mediastinal hilar masses. No pleural effusion.  No pneumothorax. Skeletal structures are grossly intact. IMPRESSION: 1. Small amount of free intraperitoneal air. Recommend follow-up abdomen and pelvis CT with contrast for further assessment. 2. Somewhat limited study due to low lung volumes. Allowing for this, no acute cardiopulmonary disease. Electronically Signed   By: Lajean Manes M.D.   On: 04/06/2018 11:23   Ct Abdomen Pelvis W Contrast  Result Date: 04/05/2018 CLINICAL DATA:  Patient has bilateral lower abdominal sharp intermittent pain with nausea without vomiting. H/x diverticulitis patient states pain and symptoms similar to past. EXAM: CT ABDOMEN AND PELVIS WITH CONTRAST TECHNIQUE: Multidetector CT imaging of the abdomen and pelvis was performed using the standard protocol following bolus administration of intravenous contrast. CONTRAST:  74mL ISOVUE-300 IOPAMIDOL (ISOVUE-300) INJECTION 61% COMPARISON:  02/06/2018 FINDINGS: Lower chest: There is scarring in both lung bases. Coronary artery calcifications are present. The heart is normal in size. Hepatobiliary: Liver is diffusely low attenuation. No focal liver lesions. Cholecystectomy. Pancreas: Unremarkable. No pancreatic ductal dilatation or surrounding inflammatory changes. Spleen: Normal in size without focal abnormality. Adrenals/Urinary Tract: Adrenal glands are normal. There is symmetric enhancement and excretion from the kidneys. 1 millimeter intrarenal calcification identified in the LOWER pole of the RIGHT kidney, not associated with obstruction. Ureters are unremarkable. The bladder and visualized portion of the urethra are normal. Stomach/Bowel: The stomach is normal in appearance. Small bowel loops are normal  in caliber and wall thickness. The appendix is well seen and has a normal appearance. There is significant inflammatory change in the region of sigmoid colon and associated numerous diverticula in this segment. Fluid extends in the sigmoid mesentery. The diseased segment of sigmoid is adjacent to the uterus and the urinary bladder. No evidence for abscess or perforation. Vascular/Lymphatic: There is atherosclerotic calcification of the abdominal aorta. There is normal vascular opacification of the celiac axis, superior mesenteric artery, and inferior mesenteric artery. Normal appearance of the portal venous system and inferior vena cava. Reproductive: The uterus is present and is surrounded by inflammatory changes from the sigmoid diverticulitis. No adnexal mass. Other: Trace amount of free pelvic fluid. Musculoskeletal: Incompletely imaged fluid attenuation collection within the LEFT hip abductors, measuring at least 7.3 x 4.8 centimeters. The appearance is stable since 2017. There are degenerative changes in the lumbar spine with 7 millimeters anterolisthesis of L4 on L5. Median sternotomy. IMPRESSION: 1. Acute sigmoid  diverticulitis with significant fluid in the sigmoid mesentery. There is no evidence for perforation or abscess. 2. Coronary artery calcifications. 3. Hepatic steatosis.  Cholecystectomy. 4. Nonobstructing intrarenal calculus in the LOWER pole the RIGHT kidney. 5. Normal appendix. 6.  Aortic atherosclerosis. 7. Probably benign ganglion cyst within the LEFT hip abductors. Electronically Signed   By: Nolon Nations M.D.   On: 04/05/2018 22:06   US Arterial Abi (screening Lower Extremity)  Result Date: 03/19/2018 CLINICAL DATA:  72 year old female with chronic bilateral lower extremity pain EXAM: NONINVASIVE PHYSIOLOGIC VASCULAR STUDY OF BILATERAL LOWER EXTREMITIES TECHNIQUE: Evaluation of both lower extremities were performed at rest, including calculation of ankle-brachial indices with single  level Doppler, pressure and pulse volume recording. COMPARISON:  None. FINDINGS: Right ABI:  1.2 Left ABI:  1.1 Right Lower Extremity:  Normal arterial waveforms at the ankle. Left Lower Extremity:  Normal arterial waveforms at the ankle. 1.0-1.4 Normal IMPRESSION: Normal examination. No evidence of hemodynamically significant peripheral arterial disease. Signed, Criselda Peaches, MD, [facility] Electronically Signed   By: Jacqulynn Cadet M.D.   On: 03/19/2018 16:28    Orson Eva, DO  [facility] Pager [phone]  If 7PM-7AM, please contact night-coverage [email or web address] Password [email or web address] 04/07/2018, 7:38 AM   LOS: 0 days

## 2018-04-07 NOTE — Progress Notes (Signed)
Initial Nutrition Assessment  DOCUMENTATION CODES:  Morbid obesity  INTERVENTION:  Pt intake PTA sounds to have been suboptimal, but not minimal. TPN not warranted at this time  Will monitor for diet advancement. She is agreeable to supplements when this occurs.   NUTRITION DIAGNOSIS:  Inadequate oral intake related to inability to eat as evidenced by NPO status.  GOAL:  Patient will meet greater than or equal to 90% of their needs  MONITOR:  PO intake, Supplement acceptance, Diet advancement, Labs, Weight trends, I & O's  REASON FOR ASSESSMENT:  Malnutrition Screening Tool    ASSESSMENT:  72 y/o female PMHx CAD, HLD/HTN, CVA, DM2, RA, Fibromyalgia/chronic pain. Presented w/ 2 weeks of lower abd pain. Had recently been dx w/ UTI and palced on abx, but pain continued. Initial CT scan showed diverticulitis w/o perf, but pt developed hypotension and repeat CT did show perforation. Surg consulted.   Pt currently NPO in hopes pneumoperitoneum will resolve w/ conservative tx. No definitive plans for surgery as of yet.   On interview, pt is fairly poor historian. She gets confused with her thoughts easily and demonstrates some issues word finding "says metalozim instead of magnesium".  RD noted he was seeing her to assess nutrition status PTA. She says she has not been eating well "for 3 months". She says she had been undergoing workups for numerous health problems recently, including a "persistent acidic/metallic taste" in her mouth and also a "throat spasm" which she claims was triggered by cold food.   Because of aforementioned issues, she was eating only 2x/day. She gives meals examples: Lunch would be a sandwich and her Dinner would be cereal. She does note she started purchasing premier protein because she was worried about protein levels. She would usually drink 1/day,  But sometimes 2. She took several vitamins/minerals, but she could not name them. Normally, she does not follow any  therapeutic diet- "I eat anything", but says she tries to chew everything well before swallowing.  Wt wise, she says her UBW is 275 lbs. Per chart, she was 266 lbs in July. She is 260.4 lbs now. It appears she had been between 265-275 lbs for several years, though many of these documented measurements suspicious for only being reported.  At this time, pt is NPO and will likely remain so for atleast a couple days. Her intake PTA sounds to have been suboptimal, but not minimal. TPN not warranted at this time. Will monitor for diet advancement. She is agreeable to supplements when this occurs.   Labs: WBC:13.6, BUN/Creat:33/1.65, Albumin: 2.7 Meds: Hydrocortisone, Doc sodium, PPI, IVF, IV abx, PRN opioids, BGs-numerous hypoglycemic episodes, 70-100 more recently  Recent Labs  Lab 04/06/18 0625 04/06/18 1951 04/07/18 0450  NA 136 136 138  K 3.4* 3.5 3.9  CL 104 105 108  CO2 25 23 21*  BUN 28* 30* 33*  CREATININE 1.38* 1.81* 1.65*  CALCIUM 7.8* 7.1* 7.2*  MG  --   --  1.3*  PHOS  --   --  3.5  GLUCOSE 106* 88 84   NUTRITION - FOCUSED PHYSICAL EXAM:   Most Recent Value  Orbital Region  No depletion  Upper Arm Region  No depletion  Thoracic and Lumbar Region  No depletion  Buccal Region  No depletion  Temple Region  No depletion  Clavicle Bone Region  No depletion  Clavicle and Acromion Bone Region  No depletion  Scapular Bone Region  No depletion  Dorsal Hand  No depletion  Patellar Region  No depletion  Anterior Thigh Region  No depletion  Posterior Calf Region  No depletion  Hair  Reviewed  Eyes  Reviewed  Mouth  Reviewed  Skin  Reviewed  Nails  Reviewed       Diet Order:   Diet Order            Diet NPO time specified Except for: Sips with Meds  Diet effective now             EDUCATION NEEDS:  No education needs have been identified at this time  Skin:  Skin Assessment: Reviewed RN Assessment  Last BM:  10/26  Height:  Ht Readings from Last 1 Encounters:   02/13/18 5\' 5"  (1.651 m)   Weight:  Wt Readings from Last 1 Encounters:  04/06/18 118.1 kg   Wt Readings from Last 10 Encounters:  04/06/18 118.1 kg  02/13/18 116.1 kg  01/13/18 120.7 kg  12/24/17 121 kg  09/04/17 122.5 kg  07/08/17 124.7 kg  06/26/17 124.7 kg  09/17/16 120.2 kg  09/13/16 120.2 kg  06/27/16 120 kg   Ideal Body Weight:  56.82 kg  BMI:  Body mass index is 43.33 kg/m.  Estimated Nutritional Needs:  Kcal:  1600-1800 (MSJ +/- 100) Protein:  85-100g Pro (1.5-1.8g/kg ibw) Fluid:  1.6-1.8 L fluid (57ml/kcal)  Burtis Junes RD, LDN, CNSC Clinical Nutrition Available Tues-Sat via Pager: [phone] 04/07/2018 1:05 PM

## 2018-04-08 DIAGNOSIS — N183 Chronic kidney disease, stage 3 unspecified: Secondary | ICD-10-CM | POA: Diagnosis present

## 2018-04-08 DIAGNOSIS — N179 Acute kidney failure, unspecified: Secondary | ICD-10-CM | POA: Diagnosis present

## 2018-04-08 LAB — BPAM RBC
BLOOD PRODUCT EXPIRATION DATE: 201912012359
Blood Product Expiration Date: 201912012359
ISSUE DATE / TIME: 201910281125
ISSUE DATE / TIME: 201910281526
Unit Type and Rh: 5100
Unit Type and Rh: 5100

## 2018-04-08 LAB — COMPREHENSIVE METABOLIC PANEL
ALBUMIN: 2.3 g/dL — AB (ref 3.5–5.0)
ALT: 33 U/L (ref 0–44)
AST: 46 U/L — AB (ref 15–41)
Alkaline Phosphatase: 48 U/L (ref 38–126)
Anion gap: 8 (ref 5–15)
BUN: 35 mg/dL — AB (ref 8–23)
CHLORIDE: 112 mmol/L — AB (ref 98–111)
CO2: 21 mmol/L — ABNORMAL LOW (ref 22–32)
CREATININE: 1.18 mg/dL — AB (ref 0.44–1.00)
Calcium: 7.3 mg/dL — ABNORMAL LOW (ref 8.9–10.3)
GFR calc Af Amer: 52 mL/min — ABNORMAL LOW (ref >60)
GFR calc non Af Amer: 45 mL/min — ABNORMAL LOW (ref >60)
Glucose, Bld: 102 mg/dL — ABNORMAL HIGH (ref 70–99)
Potassium: 4 mmol/L (ref 3.5–5.1)
Sodium: 141 mmol/L (ref 135–145)
Total Bilirubin: 0.6 mg/dL (ref 0.3–1.2)
Total Protein: 5.6 g/dL — ABNORMAL LOW (ref 6.5–8.1)

## 2018-04-08 LAB — GLUCOSE, CAPILLARY
GLUCOSE-CAPILLARY: 60 mg/dL — AB (ref 70–99)
GLUCOSE-CAPILLARY: 100 mg/dL — AB (ref 70–99)
GLUCOSE-CAPILLARY: 105 mg/dL — AB (ref 70–99)
GLUCOSE-CAPILLARY: 116 mg/dL — AB (ref 70–99)
GLUCOSE-CAPILLARY: 96 mg/dL (ref 70–99)
Glucose-Capillary: 76 mg/dL (ref 70–99)
Glucose-Capillary: 76 mg/dL (ref 70–99)
Glucose-Capillary: 80 mg/dL (ref 70–99)
Glucose-Capillary: 83 mg/dL (ref 70–99)
Glucose-Capillary: 87 mg/dL (ref 70–99)
Glucose-Capillary: 88 mg/dL (ref 70–99)
Glucose-Capillary: 90 mg/dL (ref 70–99)
Glucose-Capillary: 95 mg/dL (ref 70–99)
Glucose-Capillary: 103 mg/dL — ABNORMAL HIGH (ref 70–99)

## 2018-04-08 LAB — TYPE AND SCREEN
ABO/RH(D): O POS
Antibody Screen: NEGATIVE
UNIT DIVISION: 0
Unit division: 0

## 2018-04-08 LAB — CBC
HEMATOCRIT: 32.4 % — AB (ref 36.0–46.0)
HEMOGLOBIN: 10.4 g/dL — AB (ref 12.0–15.0)
MCH: 30.1 pg (ref 26.0–34.0)
MCHC: 32.1 g/dL (ref 30.0–36.0)
MCV: 93.6 fL (ref 80.0–100.0)
Platelets: 266 10*3/uL (ref 150–400)
RBC: 3.46 MIL/uL — ABNORMAL LOW (ref 3.87–5.11)
RDW: 17.1 % — AB (ref 11.5–15.5)
WBC: 15.9 10*3/uL — ABNORMAL HIGH (ref 4.0–10.5)
nRBC: 0 % (ref 0.0–0.2)

## 2018-04-08 LAB — MRSA PCR SCREENING: MRSA BY PCR: NEGATIVE

## 2018-04-08 LAB — MAGNESIUM: Magnesium: 1.9 mg/dL (ref 1.7–2.4)

## 2018-04-08 LAB — HEMOGLOBIN A1C
Hgb A1c MFr Bld: 5.9 % — ABNORMAL HIGH (ref 4.8–5.6)
MEAN PLASMA GLUCOSE: 122.63 mg/dL

## 2018-04-08 LAB — PHOSPHORUS: Phosphorus: 2.9 mg/dL (ref 2.5–4.6)

## 2018-04-08 LAB — PROCALCITONIN: Procalcitonin: 1.75 ng/mL

## 2018-04-08 MED ORDER — HYDROCORTISONE NA SUCCINATE PF 100 MG IJ SOLR
50.0000 mg | Freq: Two times a day (BID) | INTRAMUSCULAR | Status: AC
  Administered 2018-04-08 – 2018-04-09 (×2): 50 mg via INTRAVENOUS
  Filled 2018-04-08 (×2): qty 2

## 2018-04-08 MED ORDER — HYDROCORTISONE NA SUCCINATE PF 100 MG IJ SOLR: 50.0000 mg | Freq: Two times a day (BID) | INTRAMUSCULAR | Status: DC

## 2018-04-08 MED ORDER — HYDROCORTISONE NA SUCCINATE PF 100 MG IJ SOLR
50.0000 mg | Freq: Three times a day (TID) | INTRAMUSCULAR | Status: DC
  Administered 2018-04-08: 50 mg via INTRAVENOUS
  Filled 2018-04-08: qty 2

## 2018-04-08 NOTE — Progress Notes (Signed)
Subjective: Patient states her abdominal pain seems to have eased.  She is passing some flatus.  Objective: Vital signs in last 24 hours: Temp:  [97.4 F (36.3 C)-98.3 F (36.8 C)] 97.6 F (36.4 C) (10/29 0400) Pulse Rate:  [71-86] 76 (10/29 0730) Resp:  [9-22] 14 (10/29 0730) BP: (84-144)/(44-82) 128/81 (10/29 0700) SpO2:  [86 %-99 %] 95 % (10/29 0730) Weight:  [119.3 kg] 119.3 kg (10/29 0500) Last BM Date: 04/04/18(per pt)  Intake/Output from previous day: 10/28 0701 - 10/29 0700 In: 3182.7 [I.V.:2249.7; Blood:633; IV Piggyback:300] Out: 650 [Urine:650] Intake/Output this shift: No intake/output data recorded.  General appearance: alert, cooperative and no distress GI: Soft with minimal tenderness to deep palpation in the left lower quadrant.  No referred pain when palpating the right side of her abdomen.  No rigidity is noted.  Occasional bowel sounds appreciated.  Lab Results:  Recent Labs    04/07/18 0450 04/08/18 0415  WBC 13.6* 15.9*  HGB 7.7* 10.4*  HCT 25.0* 32.4*  PLT 246 266   BMET Recent Labs    04/07/18 0450 04/08/18 0415  NA 138 141  K 3.9 4.0  CL 108 112*  CO2 21* 21*  GLUCOSE 84 102*  BUN 33* 35*  CREATININE 1.65* 1.18*  CALCIUM 7.2* 7.3*   PT/INR Recent Labs    04/06/18 1952  LABPROT 15.3*  INR 1.22    Studies/Results: Ct Abdomen Pelvis Wo Contrast  Result Date: 04/06/2018 CLINICAL DATA:  72 year old female with concern for perforated diverticulitis. EXAM: CT ABDOMEN AND PELVIS WITHOUT CONTRAST TECHNIQUE: Multidetector CT imaging of the abdomen and pelvis was performed following the standard protocol without IV contrast. COMPARISON:  Chest radiograph dated 04/06/2018 and CT dated 04/05/2018 FINDINGS: Evaluation of this exam is limited in the absence of intravenous contrast. Lower chest: Diffuse interstitial coarsening and streaky atelectasis/scarring of the lung bases. There is mild cardiomegaly. There is hypoattenuation of the  cardiac blood pool suggestive of a degree of anemia. Clinical correlation is recommended. There is pneumoperitoneum and small amount of free fluid within the pelvis. Hepatobiliary: Advanced fatty infiltration of the liver. No intrahepatic biliary ductal dilatation. Cholecystectomy. Pancreas: Unremarkable. No pancreatic ductal dilatation or surrounding inflammatory changes. Spleen: Normal in size without focal abnormality. Adrenals/Urinary Tract: The adrenal glands are unremarkable. There is no hydronephrosis on either side. Excreted contrast in the renal collecting systems bilaterally. The visualized ureters appear unremarkable. There is trabeculated appearance of the bladder wall likely related to chronic bladder dysfunction. Correlation with urinalysis recommended to exclude cystitis. Stomach/Bowel: There is extensive sigmoid diverticulosis. There is associated inflammatory changes and perforation of the sigmoid colon. There is increased size of the extraluminal air since the prior CT. No drainable fluid collection is noted at this time. There is no bowel obstruction. Normal appendix. Vascular/Lymphatic: There is advanced aortoiliac atherosclerotic disease. No portal venous gas. There is no adenopathy. Reproductive: The uterus is grossly unremarkable. Other: None Musculoskeletal: Degenerative changes of the spine. No acute osseous pathology. IMPRESSION: 1. Perforated sigmoid diverticulitis with increased size of the extraluminal air since the prior CT. No drainable fluid collection noted at this time. 2. No bowel obstruction. Normal appendix. 3. Advanced fatty infiltration of the liver. These results were called by telephone at the time of interpretation on 04/06/2018 at 11:09 pm to nurse Hearn, who verbally acknowledged these results. Electronically Signed   By: Anner Crete M.D.   On: 04/06/2018 23:25   Dg Chest 2 View  Result Date: 04/06/2018 CLINICAL DATA:  Dyspnea,  sepsis, pt listless and unable to  cooperate EXAM: CHEST - 2 VIEW COMPARISON:  06/27/2016 and 06/18/2016. FINDINGS: On the lateral view, there is evidence of a small amount free intraperitoneal air under the left hemidiaphragm. Lung volumes are low. There is opacity at the bases that is likely due to atelectasis along with bronchovascular crowding. No convincing pneumonia and no evidence of pulmonary edema. 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 Cardiac silhouette is normal in size. No mediastinal hilar masses. No pleural effusion.  No pneumothorax. Skeletal structures are grossly intact. IMPRESSION: 1. Small amount of free intraperitoneal air. Recommend follow-up abdomen and pelvis CT with contrast for further assessment. 2. Somewhat limited study due to low lung volumes. Allowing for this, no acute cardiopulmonary disease. Electronically Signed   By: Lajean Manes M.D.   On: 04/06/2018 11:23    Anti-infectives: Anti-infectives (From admission, onward)   Start     Dose/Rate Route Frequency Ordered Stop   04/06/18 1300  meropenem (MERREM) 1 g in sodium chloride 0.9 % 100 mL IVPB     1 g 200 mL/hr over 30 Minutes Intravenous Every 12 hours 04/06/18 1145     04/06/18 1145  meropenem (MERREM) 1 g in sodium chloride 0.9 % 100 mL IVPB  Status:  Discontinued     1 g 200 mL/hr over 30 Minutes Intravenous Every 8 hours 04/06/18 1139 04/06/18 1145   04/06/18 0900  ciprofloxacin (CIPRO) IVPB 400 mg  Status:  Discontinued     400 mg 200 mL/hr over 60 Minutes Intravenous Every 12 hours 04/06/18 0732 04/06/18 1139   04/06/18 0300  metroNIDAZOLE (FLAGYL) IVPB 500 mg  Status:  Discontinued     500 mg 100 mL/hr over 60 Minutes Intravenous Every 8 hours 04/05/18 2335 04/06/18 1139   04/05/18 1930  ciprofloxacin (CIPRO) IVPB 400 mg     400 mg 200 mL/hr over 60 Minutes Intravenous  Once 04/05/18 1925 04/05/18 2217   04/05/18 1930  metroNIDAZOLE (FLAGYL) IVPB 500 mg     500 mg 100 mL/hr over 60 Minutes Intravenous  Once 04/05/18 1925 04/05/18  2102      Assessment/Plan: Impression: Perforated sigmoid diverticulitis, improving.  No peritoneal signs requiring surgical intervention. Plan: May have sips of clears.  Continue IV antibiotics.  May get up in chair.  LOS: 1 day    Aviva Signs 04/08/2018

## 2018-04-08 NOTE — Progress Notes (Addendum)
PROGRESS NOTE  [name] ZOX:[id number] DOB: [birth year] DOA: 04/05/2018 PCP: [name], MD  Brief History:  72 year old female with a history of coronary artery disease, hyperlipidemia, stroke, hypertension, diabetes mellitus, rheumatoid arthritis, fibromyalgia/chronic pain presenting with 2-week history of lower abdominal pain. The patient states that has progressively worsened. She felt that she may have had some UTI type symptoms and contacted her primary care provider on 04/03/2018. The patient was prescribed antibiotic the name of which she does not remember. She began taking antibiotics on the evening of 04/03/2018. Her abdominal pain persisted. As result, the patient presented for further evaluation. Patient had complained of fevers and chills up to 103.0 F at home. She denied any headache, neck pain, chest pain, coughing, vomiting, diarrhea, hematochezia, melena, dysuria, hematuria. She has chronic shortness of breath which she states has not worsened. Patient has been using hydrocodone for her abdominal pain. She has been receiving hydrocodone from her primary care provider for her rheumatoid arthritis and fibromyalgia. Upon presentation, the patient was noted to have WBC 13.8 with a fever 102.5 F. CT of the abdomen and pelvis showed acute sigmoid diverticulitis with significant fluid around the sigmoid mesentery. There was no perforation or abscess. The patient was started on Cipro and Flagyl and IV fluids initially.  On the afternoon of 04/06/2018, the patient became progressively hypotensive.  Chest x-ray was obtained at that time and suggested possible free air under the diaphragm.  Repeat CT of the abdomen and pelvis was obtained, and the patient's antibiotic coverage was broadened to meropenem.  The patient was transferred to the stepdown unit.  Subsequently, repeat CT of the abdomen and pelvis showed increasing pneumoperitoneum.  General surgery was  consulted.  Assessment/Plan: Sepsis -Present at the time of admission -secondary to perforated diverticulitis -Continue IV fluids -Broadened abx coverage to Merrem -Lactic acid 1.1 -Follow blood cultures--suspect they may be compromised as the patient was on outpatient antibiotics prior to admission -04/05/18 UA--no pyuria -PICC placed 04/06/2018 -Continue Solu-Cortef>>>wean -Procalcitonin peaked 3.25>>1.75  Perforated sigmoid diverticulitis -Continue Merrem -04/06/2018 CT abdomen--diffuse interstitial coarsening.  Pneumoperitoneum present.  Diverticulitis with inflammatory changes and perforation of the sigmoid colon.  Increased extraluminal air. -10/29--case discussed with general surgery--Dr. Edythe Lynn of clears only; continue non-operative tx -Judicious IV Dilaudid -WBC increased due to 2 units PRBC (10/28) given, and steroids  Acute on chronic renal failure-CKD stage III -Baseline creatinine 1.2-1.5 -serum creatinine peaked 1.81 -due to sepsis -A.m. BMP  Essential hypertension -Holding amlodipine, ARB, and HCTZ secondary to hypotension  -Holding metoprolol succinate due to hypotension  Diabetes mellitus type 2 -Holding glipizide -Hemoglobin A1c--5.9 -NovoLog sliding scale  Coronary artery disease -No chest pain presently -Holding Plavix in case pt needs to go to surgery -Holding metoprolol succinate due to hypotension  Rheumatoid arthritis -Discontinue Arava--pt states she no longer takes it -pt follows Dr. Amil Amen in outpatient setting  Hyperlipidemia -restart statin once able to tolerate po  Chronic pain syndrome/Fibromyalgia -continue home dose hydrocodone when able to tolerate po    Disposition Plan: remain in stepdown Family Communication: spouse updated at bedside 10/29  Consultants: general surgery  Code Status: FULL  DVT Prophylaxis: [place] heparin   Procedures: As Listed in Progress Note  Above  Antibiotics: cipro 10/26>>>10/27 Flagyl 10/26>>>10/27 Merrem 10/27>>>   Subjective:   Objective: Vitals:   04/08/18 1400 04/08/18 1500 04/08/18 1600 04/08/18 1631  BP:  (!) 154/81 (!) 148/86   Pulse: 84 87 89  Resp: 19 12 20    Temp:    98.3 F (36.8 C)  TempSrc:    Oral  SpO2: 91% 90% 92%   Weight:        Intake/Output Summary (Last 24 hours) at 04/08/2018 1806 Last data filed at 04/08/2018 1634 Gross per 24 hour  Intake 2580.64 ml  Output 1100 ml  Net 1480.64 ml   Weight change:  Exam:   General:  Pt is alert, follows commands appropriately, not in acute distress  HEENT: No icterus, No thrush, No neck mass, [place]/AT  Cardiovascular: RRR, S1/S2, no rubs, no gallops  Respiratory: CTA bilaterally, no wheezing, no crackles, no rhonchi  Abdomen: Soft/+BS, non tender, non distended, no guarding  Extremities: No edema, No lymphangitis, No petechiae, No rashes, no synovitis   Data Reviewed: I have personally reviewed following labs and imaging studies Basic Metabolic Panel: Recent Labs  Lab 04/05/18 1948 04/05/18 1952 04/06/18 0625 04/06/18 1951 04/07/18 0450 04/08/18 0415  NA 136 137 136 136 138 141  K 3.7 3.8 3.4* 3.5 3.9 4.0  CL 102 103 104 105 108 112*  CO2 26  --  25 23 21* 21*  GLUCOSE 102* 101* 106* 88 84 102*  BUN 33* 31* 28* 30* 33* 35*  CREATININE 1.43* 1.50* 1.38* 1.81* 1.65* 1.18*  CALCIUM 8.7*  --  7.8* 7.1* 7.2* 7.3*  MG  --   --   --   --  1.3* 1.9  PHOS  --   --   --   --  3.5 2.9   Liver Function Tests: Recent Labs  Lab 04/05/18 1948 04/06/18 0625 04/08/18 0415  AST 75* 58* 46*  ALT 43 38 33  ALKPHOS 59 45 48  BILITOT 0.7 0.6 0.6  PROT 6.9 5.8* 5.6*  ALBUMIN 3.3* 2.7* 2.3*   No results for input(s): LIPASE, AMYLASE in the last 168 hours. No results for input(s): AMMONIA in the last 168 hours. Coagulation Profile: Recent Labs  Lab 04/06/18 1952  INR 1.22   CBC: Recent Labs  Lab 04/05/18 1948 04/05/18 1952  04/06/18 0625 04/06/18 1951 04/07/18 0450 04/08/18 0415  WBC 13.8*  --  11.0* 13.2* 13.6* 15.9*  NEUTROABS 12.7*  --   --   --   --   --   HGB 10.0* 10.9* 8.3* 7.7* 7.7* 10.4*  HCT 31.9* 32.0* 27.0* 25.7* 25.0* 32.4*  MCV 97.6  --  98.9 99.2 99.2 93.6  PLT 353  --  291 257 246 266   Cardiac Enzymes: No results for input(s): CKTOTAL, CKMB, CKMBINDEX, TROPONINI in the last 168 hours. BNP: Invalid input(s): POCBNP CBG: Recent Labs  Lab 04/08/18 0013 04/08/18 0431 04/08/18 0851 04/08/18 1119 04/08/18 1633  GLUCAP 80 95 103* 105* 116*   HbA1C: Recent Labs    04/07/18 0450  HGBA1C 5.9*   Urine analysis:    Component Value Date/Time   COLORURINE YELLOW 04/05/2018 1924   APPEARANCEUR CLEAR 04/05/2018 1924   LABSPEC 1.021 04/05/2018 1924   PHURINE 5.0 04/05/2018 1924   GLUCOSEU NEGATIVE 04/05/2018 1924   HGBUR NEGATIVE 04/05/2018 1924   BILIRUBINUR NEGATIVE 04/05/2018 [place] NEGATIVE 04/05/2018 1924   PROTEINUR NEGATIVE 04/05/2018 1924   UROBILINOGEN 0.2 06/20/2013 1832   NITRITE NEGATIVE 04/05/2018 1924   LEUKOCYTESUR NEGATIVE 04/05/2018 1924   Sepsis Labs: @LABRCNTIP (procalcitonin:4,lacticidven:4) ) Recent Results (from the past 240 hour(s))  Blood Culture (routine x 2)     Status: None (Preliminary result)   Collection Time: 04/05/18  7:48 PM  Result Value Ref Range Status   Specimen Description BLOOD LEFT FOREARM  Final   Special Requests   Final    BOTTLES DRAWN AEROBIC AND ANAEROBIC Blood Culture adequate volume   Culture   Final    NO GROWTH 3 DAYS Performed at [facility], [address] [place], [place] [address]    Report Status PENDING  Incomplete  Blood Culture (routine x 2)     Status: None (Preliminary result)   Collection Time: 04/05/18  7:57 PM  Result Value Ref Range Status   Specimen Description RIGHT ANTECUBITAL  Final   Special Requests   Final    BOTTLES DRAWN AEROBIC ONLY Blood Culture adequate volume   Culture   Final     NO GROWTH 3 DAYS Performed at [facility], [address], [place], [place] [address]    Report Status PENDING  Incomplete  MRSA PCR Screening     Status: None   Collection Time: 04/07/18  9:08 PM  Result Value Ref Range Status   MRSA by PCR NEGATIVE NEGATIVE Final    Comment:        The GeneXpert MRSA Assay (FDA approved for NASAL specimens only), is one component of a comprehensive MRSA colonization surveillance program. It is not intended to diagnose MRSA infection nor to guide or monitor treatment for MRSA infections. Performed at [facility], [address], [place], [place] [address]      Scheduled Meds: . Chlorhexidine Gluconate Cloth  6 each Topical Daily  . docusate sodium  200 mg Oral QHS  . hydrocortisone sod succinate (SOLU-CORTEF) inj  50 mg Intravenous Q8H  . latanoprost  1 drop Both Eyes QHS  . leflunomide  20 mg Oral Daily  . pantoprazole  40 mg Oral Daily  . pravastatin  40 mg Oral QHS  . sodium chloride flush  10-40 mL Intracatheter Q12H  . temazepam  30 mg Oral QHS  . timolol  1 drop Both Eyes BID   Continuous Infusions: . 0.9 % NaCl with KCl 20 mEq / L 100 mL/hr at 04/08/18 1200  . meropenem (MERREM) IV 1 g (04/08/18 1112)    Procedures/Studies: Ct Abdomen Pelvis Wo Contrast  Result Date: 04/06/2018 CLINICAL DATA:  72 year old female with concern for perforated diverticulitis. EXAM: CT ABDOMEN AND PELVIS WITHOUT CONTRAST TECHNIQUE: Multidetector CT imaging of the abdomen and pelvis was performed following the standard protocol without IV contrast. COMPARISON:  Chest radiograph dated 04/06/2018 and CT dated 04/05/2018 FINDINGS: Evaluation of this exam is limited in the absence of intravenous contrast. Lower chest: Diffuse interstitial coarsening and streaky atelectasis/scarring of the lung bases. There is mild cardiomegaly. There is hypoattenuation of the cardiac blood pool suggestive of a degree of anemia. Clinical correlation is recommended. There is  pneumoperitoneum and small amount of free fluid within the pelvis. Hepatobiliary: Advanced fatty infiltration of the liver. No intrahepatic biliary ductal dilatation. Cholecystectomy. Pancreas: Unremarkable. No pancreatic ductal dilatation or surrounding inflammatory changes. Spleen: Normal in size without focal abnormality. Adrenals/Urinary Tract: The adrenal glands are unremarkable. There is no hydronephrosis on either side. Excreted contrast in the renal collecting systems bilaterally. The visualized ureters appear unremarkable. There is trabeculated appearance of the bladder wall likely related to chronic bladder dysfunction. Correlation with urinalysis recommended to exclude cystitis. Stomach/Bowel: There is extensive sigmoid diverticulosis. There is associated inflammatory changes and perforation of the sigmoid colon. There is increased size of the extraluminal air since the prior CT. No drainable fluid collection  is noted at this time. There is no bowel obstruction. Normal appendix. Vascular/Lymphatic: There is advanced aortoiliac atherosclerotic disease. No portal venous gas. There is no adenopathy. Reproductive: The uterus is grossly unremarkable. Other: None Musculoskeletal: Degenerative changes of the spine. No acute osseous pathology. IMPRESSION: 1. Perforated sigmoid diverticulitis with increased size of the extraluminal air since the prior CT. No drainable fluid collection noted at this time. 2. No bowel obstruction. Normal appendix. 3. Advanced fatty infiltration of the liver. These results were called by telephone at the time of interpretation on 04/06/2018 at 11:09 pm to nurse Hearn, who verbally acknowledged these results. Electronically Signed   By: Anner Crete M.D.   On: 04/06/2018 23:25   Dg Chest 2 View  Result Date: 04/06/2018 CLINICAL DATA:  Dyspnea, sepsis, pt listless and unable to cooperate EXAM: CHEST - 2 VIEW COMPARISON:  06/27/2016 and 06/18/2016. FINDINGS: On the lateral  view, there is evidence of a small amount free intraperitoneal air under the left hemidiaphragm. Lung volumes are low. There is opacity at the bases that is likely due to atelectasis along with bronchovascular crowding. No convincing pneumonia and no evidence of pulmonary edema. 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 Cardiac silhouette is normal in size. No mediastinal hilar masses. No pleural effusion.  No pneumothorax. Skeletal structures are grossly intact. IMPRESSION: 1. Small amount of free intraperitoneal air. Recommend follow-up abdomen and pelvis CT with contrast for further assessment. 2. Somewhat limited study due to low lung volumes. Allowing for this, no acute cardiopulmonary disease. Electronically Signed   By: Lajean Manes M.D.   On: 04/06/2018 11:23   Ct Abdomen Pelvis W Contrast  Result Date: 04/05/2018 CLINICAL DATA:  Patient has bilateral lower abdominal sharp intermittent pain with nausea without vomiting. H/x diverticulitis patient states pain and symptoms similar to past. EXAM: CT ABDOMEN AND PELVIS WITH CONTRAST TECHNIQUE: Multidetector CT imaging of the abdomen and pelvis was performed using the standard protocol following bolus administration of intravenous contrast. CONTRAST:  79mL ISOVUE-300 IOPAMIDOL (ISOVUE-300) INJECTION 61% COMPARISON:  02/06/2018 FINDINGS: Lower chest: There is scarring in both lung bases. Coronary artery calcifications are present. The heart is normal in size. Hepatobiliary: Liver is diffusely low attenuation. No focal liver lesions. Cholecystectomy. Pancreas: Unremarkable. No pancreatic ductal dilatation or surrounding inflammatory changes. Spleen: Normal in size without focal abnormality. Adrenals/Urinary Tract: Adrenal glands are normal. There is symmetric enhancement and excretion from the kidneys. 1 millimeter intrarenal calcification identified in the LOWER pole of the RIGHT kidney, not associated with obstruction. Ureters are unremarkable. The bladder  and visualized portion of the urethra are normal. Stomach/Bowel: The stomach is normal in appearance. Small bowel loops are normal in caliber and wall thickness. The appendix is well seen and has a normal appearance. There is significant inflammatory change in the region of sigmoid colon and associated numerous diverticula in this segment. Fluid extends in the sigmoid mesentery. The diseased segment of sigmoid is adjacent to the uterus and the urinary bladder. No evidence for abscess or perforation. Vascular/Lymphatic: There is atherosclerotic calcification of the abdominal aorta. There is normal vascular opacification of the celiac axis, superior mesenteric artery, and inferior mesenteric artery. Normal appearance of the portal venous system and inferior vena cava. Reproductive: The uterus is present and is surrounded by inflammatory changes from the sigmoid diverticulitis. No adnexal mass. Other: Trace amount of free pelvic fluid. Musculoskeletal: Incompletely imaged fluid attenuation collection within the LEFT hip abductors, measuring at least 7.3 x 4.8 centimeters. The appearance is stable  since 2017. There are degenerative changes in the lumbar spine with 7 millimeters anterolisthesis of L4 on L5. Median sternotomy. IMPRESSION: 1. Acute sigmoid diverticulitis with significant fluid in the sigmoid mesentery. There is no evidence for perforation or abscess. 2. Coronary artery calcifications. 3. Hepatic steatosis.  Cholecystectomy. 4. Nonobstructing intrarenal calculus in the LOWER pole the RIGHT kidney. 5. Normal appendix. 6.  Aortic atherosclerosis. 7. Probably benign ganglion cyst within the LEFT hip abductors. Electronically Signed   By: Nolon Nations M.D.   On: 04/05/2018 22:06   US Arterial Abi (screening Lower Extremity)  Result Date: 03/19/2018 CLINICAL DATA:  72 year old female with chronic bilateral lower extremity pain EXAM: NONINVASIVE PHYSIOLOGIC VASCULAR STUDY OF BILATERAL LOWER EXTREMITIES  TECHNIQUE: Evaluation of both lower extremities were performed at rest, including calculation of ankle-brachial indices with single level Doppler, pressure and pulse volume recording. COMPARISON:  None. FINDINGS: Right ABI:  1.2 Left ABI:  1.1 Right Lower Extremity:  Normal arterial waveforms at the ankle. Left Lower Extremity:  Normal arterial waveforms at the ankle. 1.0-1.4 Normal IMPRESSION: Normal examination. No evidence of hemodynamically significant peripheral arterial disease. Signed, Criselda Peaches, MD, [facility] Electronically Signed   By: Jacqulynn Cadet M.D.   On: 03/19/2018 16:28    Orson Eva, DO  [facility] Pager [phone]  If 7PM-7AM, please contact night-coverage [email or web address] Password [email or web address] 04/08/2018, 6:06 PM   LOS: 1 day

## 2018-04-09 LAB — BASIC METABOLIC PANEL
ANION GAP: 7 (ref 5–15)
BUN: 44 mg/dL — ABNORMAL HIGH (ref 8–23)
CO2: 21 mmol/L — AB (ref 22–32)
CREATININE: 1.13 mg/dL — AB (ref 0.44–1.00)
Calcium: 7.3 mg/dL — ABNORMAL LOW (ref 8.9–10.3)
Chloride: 111 mmol/L (ref 98–111)
GFR calc non Af Amer: 47 mL/min — ABNORMAL LOW (ref >60)
GFR, EST AFRICAN AMERICAN: 54 mL/min — AB (ref >60)
GLUCOSE: 165 mg/dL — AB (ref 70–99)
Potassium: 4.6 mmol/L (ref 3.5–5.1)
Sodium: 139 mmol/L (ref 135–145)

## 2018-04-09 LAB — CBC
HEMATOCRIT: 33.7 % — AB (ref 36.0–46.0)
HEMATOCRIT: 34.2 % — AB (ref 36.0–46.0)
HEMOGLOBIN: 11 g/dL — AB (ref 12.0–15.0)
HEMOGLOBIN: 11.1 g/dL — AB (ref 12.0–15.0)
MCH: 29.7 pg (ref 26.0–34.0)
MCH: 29.6 pg (ref 26.0–34.0)
MCHC: 32.6 g/dL (ref 30.0–36.0)
MCHC: 32.5 g/dL (ref 30.0–36.0)
MCV: 91.1 fL (ref 80.0–100.0)
MCV: 91.2 fL (ref 80.0–100.0)
NRBC: 0.2 % (ref 0.0–0.2)
Platelets: 313 10*3/uL (ref 150–400)
Platelets: 322 10*3/uL (ref 150–400)
RBC: 3.7 MIL/uL — AB (ref 3.87–5.11)
RBC: 3.75 MIL/uL — ABNORMAL LOW (ref 3.87–5.11)
RDW: 17.2 % — ABNORMAL HIGH (ref 11.5–15.5)
RDW: 17.4 % — ABNORMAL HIGH (ref 11.5–15.5)
WBC: 13.2 10*3/uL — ABNORMAL HIGH (ref 4.0–10.5)
WBC: 15.1 10*3/uL — AB (ref 4.0–10.5)
nRBC: 0 % (ref 0.0–0.2)

## 2018-04-09 LAB — COMPREHENSIVE METABOLIC PANEL
ALT: 36 U/L (ref 0–44)
ANION GAP: 7 (ref 5–15)
AST: 54 U/L — ABNORMAL HIGH (ref 15–41)
Albumin: 2.2 g/dL — ABNORMAL LOW (ref 3.5–5.0)
Alkaline Phosphatase: 55 U/L (ref 38–126)
BUN: 40 mg/dL — ABNORMAL HIGH (ref 8–23)
CHLORIDE: 112 mmol/L — AB (ref 98–111)
CO2: 22 mmol/L (ref 22–32)
Calcium: 7.2 mg/dL — ABNORMAL LOW (ref 8.9–10.3)
Creatinine, Ser: 0.99 mg/dL (ref 0.44–1.00)
GFR calc non Af Amer: 55 mL/min — ABNORMAL LOW (ref >60)
Glucose, Bld: 127 mg/dL — ABNORMAL HIGH (ref 70–99)
Potassium: 4.1 mmol/L (ref 3.5–5.1)
SODIUM: 141 mmol/L (ref 135–145)
Total Bilirubin: 0.5 mg/dL (ref 0.3–1.2)
Total Protein: 5.2 g/dL — ABNORMAL LOW (ref 6.5–8.1)

## 2018-04-09 LAB — GLUCOSE, CAPILLARY
GLUCOSE-CAPILLARY: 123 mg/dL — AB (ref 70–99)
GLUCOSE-CAPILLARY: 169 mg/dL — AB (ref 70–99)
GLUCOSE-CAPILLARY: 167 mg/dL — AB (ref 70–99)
Glucose-Capillary: 107 mg/dL — ABNORMAL HIGH (ref 70–99)
Glucose-Capillary: 133 mg/dL — ABNORMAL HIGH (ref 70–99)
Glucose-Capillary: 96 mg/dL (ref 70–99)

## 2018-04-09 LAB — LACTIC ACID, PLASMA: LACTIC ACID, VENOUS: 1.2 mmol/L (ref 0.5–1.9)

## 2018-04-09 LAB — TROPONIN I: Troponin I: 0.07 ng/mL (ref <0.03)

## 2018-04-09 LAB — MAGNESIUM: Magnesium: 1.8 mg/dL (ref 1.7–2.4)

## 2018-04-09 MED ORDER — HYDROMORPHONE HCL 1 MG/ML IJ SOLN
1.0000 mg | Freq: Once | INTRAMUSCULAR | Status: AC
  Administered 2018-04-09: 1 mg via INTRAVENOUS
  Filled 2018-04-09: qty 1

## 2018-04-09 MED ORDER — METOPROLOL TARTRATE 5 MG/5ML IV SOLN
INTRAVENOUS | Status: AC
  Administered 2018-04-09: 5 mg
  Filled 2018-04-09: qty 5

## 2018-04-09 MED ORDER — HEPARIN (PORCINE) IN NACL 100-0.45 UNIT/ML-% IJ SOLN
INTRAMUSCULAR | Status: AC
  Filled 2018-04-09: qty 250

## 2018-04-09 MED ORDER — HYDROMORPHONE HCL 1 MG/ML IJ SOLN
1.0000 mg | INTRAMUSCULAR | Status: DC | PRN
  Administered 2018-04-09 – 2018-04-14 (×28): 1 mg via INTRAVENOUS
  Filled 2018-04-09 (×28): qty 1

## 2018-04-09 MED ORDER — SODIUM CHLORIDE 0.9 % IV SOLN
1.0000 g | Freq: Three times a day (TID) | INTRAVENOUS | Status: DC
  Administered 2018-04-09 – 2018-04-14 (×14): 1 g via INTRAVENOUS
  Filled 2018-04-09 (×19): qty 1

## 2018-04-09 MED ORDER — HYDRALAZINE HCL 20 MG/ML IJ SOLN: 10.0000 mg | Freq: Four times a day (QID) | INTRAMUSCULAR | Status: DC | PRN

## 2018-04-09 MED ORDER — LORAZEPAM 2 MG/ML IJ SOLN
1.0000 mg | INTRAMUSCULAR | Status: DC | PRN
  Administered 2018-04-10 – 2018-04-13 (×3): 1 mg via INTRAVENOUS
  Filled 2018-04-09 (×3): qty 1

## 2018-04-09 MED ORDER — SODIUM CHLORIDE 0.9 % IV BOLUS
500.0000 mL | Freq: Once | INTRAVENOUS | Status: AC
  Administered 2018-04-09: 500 mL via INTRAVENOUS

## 2018-04-09 MED ORDER — METOPROLOL TARTRATE 5 MG/5ML IV SOLN
5.0000 mg | INTRAVENOUS | Status: DC | PRN
  Administered 2018-04-09 (×2): 5 mg via INTRAVENOUS
  Filled 2018-04-09 (×2): qty 5

## 2018-04-09 MED ORDER — SENNOSIDES-DOCUSATE SODIUM 8.6-50 MG PO TABS
2.0000 | ORAL_TABLET | Freq: Two times a day (BID) | ORAL | Status: DC
  Administered 2018-04-09 – 2018-04-14 (×9): 2 via ORAL
  Filled 2018-04-09 (×10): qty 2

## 2018-04-09 NOTE — Progress Notes (Signed)
[facility] Progress Note Patient Name: [name] DOB: 04/16/1946 MRN: [id number]   Date of Service  04/09/2018  HPI/Events of Note  HR 180s on routine monitoring.  Patient denies chest pain, given dilaudid 1 mg IV earlier for lower abdominal pain which she has had since admission. EKG appears to be in SVT  eICU Interventions  Metoprolol 5 mg IV q 9mins ordered. CBC, electrolytes and troponins pending.  Discussed with bedside MD     Intervention Category Major Interventions: Arrhythmia - evaluation and management  Judd Lien 04/09/2018, 9:45 PM

## 2018-04-09 NOTE — Progress Notes (Signed)
Patient Demographics:    [name], is a 72 y.o. female, DOB - 17-Feb-1946, CBS:[id number]  Admit date - 04/05/2018   Admitting Physician [name], MD  Outpatient Primary MD for the patient is [name], MD  LOS - 2   Chief Complaint  Patient presents with  . Abdominal Pain        Subjective:    [name] today has no fevers, no emesis,  No chest pain, abdominal pain especially in the left lower quadrant persists, passing gas, had small BM, husband and sister at bedside, questions answered  Assessment  & Plan :    Principal Problem:   Diverticulitis Active Problems:   DM (diabetes mellitus) ([place])   HTN (hypertension)   Rheumatoid arthritis ([place])   Anemia   Renal insufficiency   Sepsis due to undetermined organism ([place])   CKD (chronic kidney disease) stage 3, GFR 30-59 ml/min (HCC)   Fibromyalgia   Diverticulitis large intestine w/o perforation or abscess w/o bleeding   Acute diverticulitis   Perforated sigmoid colon ([place])   Acute renal failure superimposed on stage 3 chronic kidney disease (HCC)  Brief Summary- 72 year old with past medical history relevant for CAD, prior stroke/TIA, dyslipidemia morbid obesity DM2, hypertension rheumatoid arthritis fibromyalgia and chronic pain syndrome admitted 04/05/2018 with acute sigmoid vasculitis with perforation   Plan:- 1) acute perforated sigmoid diverticulitis--- procalcitonin is down to 1.75 from 3.23, white count is down to 13.2 from 15.9, abdominal pain persist, general surgeon Dr. Arnoldo Morale has adjusted patient's pain medications, continue IV meropenem started on 04/06/2018, patient remains n.p.o., no plans for surgical intervention at this time per general surgeon  2)AKI----acute kidney injury on CKD stage - III    creatinine on admission= 1.43 ,   baseline creatinine = 1.2 to 1.3    , creatinine is now= 0.99 (peaked at 1.81)    ,  renally adjust medications, avoid nephrotoxic agents/dehydration/hypotension  3)FEN----consider TPN, continue normal saline with potassium at this time,   4)HTN--okay to continue to Hold isosorbide, metoprolol, also Hold olmesartan/amlodipine/HCTZ combo ,   may use IV Hydralazine 10 mg  Every 4 hours Prn for systolic blood pressure over 160 mmhg  5)DM2- .  Excellent control with A1c of 5.9, currently n.p.o., may need TPN, Use Novolog/Humalog Sliding scale insulin with Accu-Cheks/Fingersticks as ordered    6)H/o CAD/TIA--- previously on Plavix and pravastatin, may use rectal aspirin for now  7)Morbid obesity--BMI over 44 , this complicates overall care  8) sepsis--- patient met sepsis criteria on admission due to #1 above, decrease stress dose steroids to hydrocortisone 25 mg every 12 hours  9) acute on chronic anemia--- suspect some degree of anemia chronic disease in patient with rheumatoid arthritis, H&H improved after transfusion of 2 units of packed red blood cells on 04/07/2017  Disposition/Need for in-Hospital Stay- patient unable to be discharged at this time due to   Code Status : full   Disposition Plan  : TBD  Consults  :  Gen surgery   DVT Prophylaxis  :   SCDs  Lab Results  Component Value Date   PLT 313 04/09/2018    Inpatient Medications  Scheduled Meds: . Chlorhexidine Gluconate Cloth  6 each Topical Daily  . docusate  sodium  200 mg Oral QHS  . hydrocortisone sod succinate (SOLU-CORTEF) inj  50 mg Intravenous Q12H  . latanoprost  1 drop Both Eyes QHS  . leflunomide  20 mg Oral Daily  . pantoprazole  40 mg Oral Daily  . pravastatin  40 mg Oral QHS  . sodium chloride flush  10-40 mL Intracatheter Q12H  . temazepam  30 mg Oral QHS  . timolol  1 drop Both Eyes BID   Continuous Infusions: . 0.9 % NaCl with KCl 20 mEq / L 100 mL/hr at 04/09/18 0826  . meropenem (MERREM) IV 1 g (04/09/18 1011)   PRN Meds:.acetaminophen **OR** acetaminophen, HYDROmorphone  (DILAUDID) injection, LORazepam, ondansetron (ZOFRAN) IV, sodium chloride flush    Anti-infectives (From admission, onward)   Start     Dose/Rate Route Frequency Ordered Stop   04/06/18 1300  meropenem (MERREM) 1 g in sodium chloride 0.9 % 100 mL IVPB     1 g 200 mL/hr over 30 Minutes Intravenous Every 12 hours 04/06/18 1145     04/06/18 1145  meropenem (MERREM) 1 g in sodium chloride 0.9 % 100 mL IVPB  Status:  Discontinued     1 g 200 mL/hr over 30 Minutes Intravenous Every 8 hours 04/06/18 1139 04/06/18 1145   04/06/18 0900  ciprofloxacin (CIPRO) IVPB 400 mg  Status:  Discontinued     400 mg 200 mL/hr over 60 Minutes Intravenous Every 12 hours 04/06/18 0732 04/06/18 1139   04/06/18 0300  metroNIDAZOLE (FLAGYL) IVPB 500 mg  Status:  Discontinued     500 mg 100 mL/hr over 60 Minutes Intravenous Every 8 hours 04/05/18 2335 04/06/18 1139   04/05/18 1930  ciprofloxacin (CIPRO) IVPB 400 mg     400 mg 200 mL/hr over 60 Minutes Intravenous  Once 04/05/18 1925 04/05/18 2217   04/05/18 1930  metroNIDAZOLE (FLAGYL) IVPB 500 mg     500 mg 100 mL/hr over 60 Minutes Intravenous  Once 04/05/18 1925 04/05/18 2102        Objective:   Vitals:   04/09/18 0500 04/09/18 0600 04/09/18 0756 04/09/18 0900  BP:  129/66 (!) 134/40 126/62  Pulse:  (!) 108 80   Resp:  (!) 22 20   Temp:  98.9 F (37.2 C) 97.7 F (36.5 C)   TempSrc:  Oral Oral   SpO2:  90% 94%   Weight: 121.6 kg       Wt Readings from Last 3 Encounters:  04/09/18 121.6 kg  02/13/18 116.1 kg  01/13/18 120.7 kg     Intake/Output Summary (Last 24 hours) at 04/09/2018 1141 Last data filed at 04/09/2018 1011 Gross per 24 hour  Intake 2587.38 ml  Output 900 ml  Net 1687.38 ml     Physical Exam Patient is examined daily including today on 04/09/18 , exams remain the same as of yesterday except that has changed   Gen:- Awake Alert, morbidly obese, uncomfortable with pain HEENT:- [place].AT, No sclera icterus Neck-Supple  Neck,No JVD,.  Lungs-  CTAB , fairly symmetrical air movement CV- S1, S2 normal Abd-  +ve B.Sounds, Abd Soft, generalized abdominal tenderness worse in the left lower quadrant, no significant distention Extremity/Skin:- No  edema,   good pulses, PICC line site is clean dry and intact Psych-affect is appropriate, oriented x3 Neuro-no new focal deficits, no tremors   Data Review:   Micro Results Recent Results (from the past 240 hour(s))  Blood Culture (routine x 2)     Status: None (Preliminary result)  Collection Time: 04/05/18  7:48 PM  Result Value Ref Range Status   Specimen Description BLOOD LEFT FOREARM  Final   Special Requests   Final    BOTTLES DRAWN AEROBIC AND ANAEROBIC Blood Culture adequate volume   Culture   Final    NO GROWTH 4 DAYS Performed at [facility], [address] [place], [place] [address]    Report Status PENDING  Incomplete  Blood Culture (routine x 2)     Status: None (Preliminary result)   Collection Time: 04/05/18  7:57 PM  Result Value Ref Range Status   Specimen Description RIGHT ANTECUBITAL  Final   Special Requests   Final    BOTTLES DRAWN AEROBIC ONLY Blood Culture adequate volume   Culture   Final    NO GROWTH 4 DAYS Performed at [facility], [address] [place], [place] [address]    Report Status PENDING  Incomplete  MRSA PCR Screening     Status: None   Collection Time: 04/07/18  9:08 PM  Result Value Ref Range Status   MRSA by PCR NEGATIVE NEGATIVE Final    Comment:        The GeneXpert MRSA Assay (FDA approved for NASAL specimens only), is one component of a comprehensive MRSA colonization surveillance program. It is not intended to diagnose MRSA infection nor to guide or monitor treatment for MRSA infections. Performed at [facility], [address], [place], [place] [address]     Radiology Reports Ct Abdomen Pelvis Wo Contrast  Result Date: 04/06/2018 CLINICAL DATA:  72 year old female with concern for perforated  diverticulitis. EXAM: CT ABDOMEN AND PELVIS WITHOUT CONTRAST TECHNIQUE: Multidetector CT imaging of the abdomen and pelvis was performed following the standard protocol without IV contrast. COMPARISON:  Chest radiograph dated 04/06/2018 and CT dated 04/05/2018 FINDINGS: Evaluation of this exam is limited in the absence of intravenous contrast. Lower chest: Diffuse interstitial coarsening and streaky atelectasis/scarring of the lung bases. There is mild cardiomegaly. There is hypoattenuation of the cardiac blood pool suggestive of a degree of anemia. Clinical correlation is recommended. There is pneumoperitoneum and small amount of free fluid within the pelvis. Hepatobiliary: Advanced fatty infiltration of the liver. No intrahepatic biliary ductal dilatation. Cholecystectomy. Pancreas: Unremarkable. No pancreatic ductal dilatation or surrounding inflammatory changes. Spleen: Normal in size without focal abnormality. Adrenals/Urinary Tract: The adrenal glands are unremarkable. There is no hydronephrosis on either side. Excreted contrast in the renal collecting systems bilaterally. The visualized ureters appear unremarkable. There is trabeculated appearance of the bladder wall likely related to chronic bladder dysfunction. Correlation with urinalysis recommended to exclude cystitis. Stomach/Bowel: There is extensive sigmoid diverticulosis. There is associated inflammatory changes and perforation of the sigmoid colon. There is increased size of the extraluminal air since the prior CT. No drainable fluid collection is noted at this time. There is no bowel obstruction. Normal appendix. Vascular/Lymphatic: There is advanced aortoiliac atherosclerotic disease. No portal venous gas. There is no adenopathy. Reproductive: The uterus is grossly unremarkable. Other: None Musculoskeletal: Degenerative changes of the spine. No acute osseous pathology. IMPRESSION: 1. Perforated sigmoid diverticulitis with increased size of the  extraluminal air since the prior CT. No drainable fluid collection noted at this time. 2. No bowel obstruction. Normal appendix. 3. Advanced fatty infiltration of the liver. These results were called by telephone at the time of interpretation on 04/06/2018 at 11:09 pm to nurse Hearn, who verbally acknowledged these results. Electronically Signed   By: Anner Crete M.D.   On: 04/06/2018  23:25   Dg Chest 2 View  Result Date: 04/06/2018 CLINICAL DATA:  Dyspnea, sepsis, pt listless and unable to cooperate EXAM: CHEST - 2 VIEW COMPARISON:  06/27/2016 and 06/18/2016. FINDINGS: On the lateral view, there is evidence of a small amount free intraperitoneal air under the left hemidiaphragm. Lung volumes are low. There is opacity at the bases that is likely due to atelectasis along with bronchovascular crowding. No convincing pneumonia and no evidence of pulmonary edema. 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 Cardiac silhouette is normal in size. No mediastinal hilar masses. No pleural effusion.  No pneumothorax. Skeletal structures are grossly intact. IMPRESSION: 1. Small amount of free intraperitoneal air. Recommend follow-up abdomen and pelvis CT with contrast for further assessment. 2. Somewhat limited study due to low lung volumes. Allowing for this, no acute cardiopulmonary disease. Electronically Signed   By: Lajean Manes M.D.   On: 04/06/2018 11:23   Ct Abdomen Pelvis W Contrast  Result Date: 04/05/2018 CLINICAL DATA:  Patient has bilateral lower abdominal sharp intermittent pain with nausea without vomiting. H/x diverticulitis patient states pain and symptoms similar to past. EXAM: CT ABDOMEN AND PELVIS WITH CONTRAST TECHNIQUE: Multidetector CT imaging of the abdomen and pelvis was performed using the standard protocol following bolus administration of intravenous contrast. CONTRAST:  38m ISOVUE-300 IOPAMIDOL (ISOVUE-300) INJECTION 61% COMPARISON:  02/06/2018 FINDINGS: Lower chest: There is scarring  in both lung bases. Coronary artery calcifications are present. The heart is normal in size. Hepatobiliary: Liver is diffusely low attenuation. No focal liver lesions. Cholecystectomy. Pancreas: Unremarkable. No pancreatic ductal dilatation or surrounding inflammatory changes. Spleen: Normal in size without focal abnormality. Adrenals/Urinary Tract: Adrenal glands are normal. There is symmetric enhancement and excretion from the kidneys. 1 millimeter intrarenal calcification identified in the LOWER pole of the RIGHT kidney, not associated with obstruction. Ureters are unremarkable. The bladder and visualized portion of the urethra are normal. Stomach/Bowel: The stomach is normal in appearance. Small bowel loops are normal in caliber and wall thickness. The appendix is well seen and has a normal appearance. There is significant inflammatory change in the region of sigmoid colon and associated numerous diverticula in this segment. Fluid extends in the sigmoid mesentery. The diseased segment of sigmoid is adjacent to the uterus and the urinary bladder. No evidence for abscess or perforation. Vascular/Lymphatic: There is atherosclerotic calcification of the abdominal aorta. There is normal vascular opacification of the celiac axis, superior mesenteric artery, and inferior mesenteric artery. Normal appearance of the portal venous system and inferior vena cava. Reproductive: The uterus is present and is surrounded by inflammatory changes from the sigmoid diverticulitis. No adnexal mass. Other: Trace amount of free pelvic fluid. Musculoskeletal: Incompletely imaged fluid attenuation collection within the LEFT hip abductors, measuring at least 7.3 x 4.8 centimeters. The appearance is stable since 2017. There are degenerative changes in the lumbar spine with 7 millimeters anterolisthesis of L4 on L5. Median sternotomy. IMPRESSION: 1. Acute sigmoid diverticulitis with significant fluid in the sigmoid mesentery. There is no  evidence for perforation or abscess. 2. Coronary artery calcifications. 3. Hepatic steatosis.  Cholecystectomy. 4. Nonobstructing intrarenal calculus in the LOWER pole the RIGHT kidney. 5. Normal appendix. 6.  Aortic atherosclerosis. 7. Probably benign ganglion cyst within the LEFT hip abductors. Electronically Signed   By: ENolon NationsM.D.   On: 04/05/2018 22:06   UKoreaArterial Abi (screening Lower Extremity)  Result Date: 03/19/2018 CLINICAL DATA:  72year old female with chronic bilateral lower extremity pain EXAM: NONINVASIVE PHYSIOLOGIC VASCULAR STUDY  OF BILATERAL LOWER EXTREMITIES TECHNIQUE: Evaluation of both lower extremities were performed at rest, including calculation of ankle-brachial indices with single level Doppler, pressure and pulse volume recording. COMPARISON:  None. FINDINGS: Right ABI:  1.2 Left ABI:  1.1 Right Lower Extremity:  Normal arterial waveforms at the ankle. Left Lower Extremity:  Normal arterial waveforms at the ankle. 1.0-1.4 Normal IMPRESSION: Normal examination. No evidence of hemodynamically significant peripheral arterial disease. Signed, Criselda Peaches, MD, [facility] Radiology Electronically Signed   By: Jacqulynn Cadet M.D.   On: 03/19/2018 16:28     CBC Recent Labs  Lab 04/05/18 1948  04/06/18 0625 04/06/18 1951 04/07/18 0450 04/08/18 0415 04/09/18 0355  WBC 13.8*  --  11.0* 13.2* 13.6* 15.9* 13.2*  HGB 10.0*   < > 8.3* 7.7* 7.7* 10.4* 11.0*  HCT 31.9*   < > 27.0* 25.7* 25.0* 32.4* 33.7*  PLT 353  --  291 257 246 266 313  MCV 97.6  --  98.9 99.2 99.2 93.6 91.1  MCH 30.6  --  30.4 29.7 30.6 30.1 29.7  MCHC 31.3  --  30.7 30.0 30.8 32.1 32.6  RDW 16.8*  --  16.9* 17.2* 17.0* 17.1* 17.2*  LYMPHSABS 0.5*  --   --   --   --   --   --   MONOABS 0.5  --   --   --   --   --   --   EOSABS 0.1  --   --   --   --   --   --   BASOSABS 0.1  --   --   --   --   --   --    < > = values in this  interval not displayed.    Chemistries  Recent Labs  Lab 04/05/18 1948  04/06/18 0625 04/06/18 1951 04/07/18 0450 04/08/18 0415 04/09/18 0355  NA 136   < > 136 136 138 141 141  K 3.7   < > 3.4* 3.5 3.9 4.0 4.1  CL 102   < > 104 105 108 112* 112*  CO2 26  --  25 23 21* 21* 22  GLUCOSE 102*   < > 106* 88 84 102* 127*  BUN 33*   < > 28* 30* 33* 35* 40*  CREATININE 1.43*   < > 1.38* 1.81* 1.65* 1.18* 0.99  CALCIUM 8.7*  --  7.8* 7.1* 7.2* 7.3* 7.2*  MG  --   --   --   --  1.3* 1.9  --   AST 75*  --  58*  --   --  46* 54*  ALT 43  --  38  --   --  33 36  ALKPHOS 59  --  45  --   --  48 55  BILITOT 0.7  --  0.6  --   --  0.6 0.5   < > = values in this interval not displayed.   ------------------------------------------------------------------------------------------------------------------ No results for input(s): CHOL, HDL, LDLCALC, TRIG, CHOLHDL, LDLDIRECT in the last 72 hours.  Lab Results  Component Value Date   HGBA1C 5.9 (H) 04/07/2018   ------------------------------------------------------------------------------------------------------------------ No results for input(s): TSH, T4TOTAL, T3FREE, THYROIDAB in the last 72 hours.  Invalid input(s): FREET3 ------------------------------------------------------------------------------------------------------------------ No results for input(s): VITAMINB12, FOLATE, FERRITIN, TIBC, IRON, RETICCTPCT in the last 72 hours.  Coagulation profile Recent Labs  Lab 04/06/18 1952  INR 1.22    No results for input(s):  DDIMER in the last 72 hours.  Cardiac Enzymes No results for input(s): CKMB, TROPONINI, MYOGLOBIN in the last 168 hours.  Invalid input(s): CK ------------------------------------------------------------------------------------------------------------------    Component Value Date/Time   BNP 34.0 06/14/2014 1415    Eliu Batch M.D on 04/09/2018 at 11:41 AM  Pager---[phone] Go to [email or web address] -  password [email or web address] for contact info  [facility] - Office  [phone]

## 2018-04-09 NOTE — Progress Notes (Signed)
Pt was Med Surg so she was not hooked up to the monitor. When NT came in room to get bedtime VS her HR was reading 179 on the dinamap so I hooked her up to the monitor and she was still reading in the 170s and 180s. Pt had no c/o chest pain. Got a 12 lead EKG to determine if rate was correct and it read critical results as a STEMI. Paged Dr. Myna Hidalgo and Warren Lacy MD chimed into room. Got STAT labs and gave 3- 5mg  doses of Metoprolol to try to slow rate down enough to see underlying rhythm. Rate came down to 140s, Gave 500cc bolus and rate dropped to 80s. Had pt status changed from med surg back to stepdown.  Critical troponin of 0.07 was called to Dr. Myna Hidalgo

## 2018-04-09 NOTE — Progress Notes (Signed)
  Subjective: Patient having more left lower quadrant abdominal pain and cramping.  This seems to be related to her starting to move her bowels and having multiple episodes of flatus.  Objective: Vital Braun in last 24 hours: Temp:  [97.7 F (36.5 C)-98.9 F (37.2 C)] 97.7 F (36.5 C) (10/30 0756) Pulse Rate:  [80-108] 80 (10/30 0756) Resp:  [11-22] 20 (10/30 0756) BP: (122-154)/(40-101) 126/62 (10/30 0900) SpO2:  [90 %-94 %] 94 % (10/30 0756) Weight:  [121.6 kg] 121.6 kg (10/30 0500) Last BM Date: 04/08/18  Intake/Output from previous day: 10/29 0701 - 10/30 0700 In: 2389.9 [I.V.:2189.9; IV Piggyback:200] Out: 1100 [Urine:1100] Intake/Output this shift: Total I/O In: 217.5 [I.V.:217.5] Out: 700 [Urine:700]  General appearance: alert, cooperative and mild distress GI: Soft with some tenderness in the left lower quadrant to deep palpation.  She does not have referred pain or rigidity when palpating the right side of her abdomen.  Lab Results:  Recent Labs    04/08/18 0415 04/09/18 0355  WBC 15.9* 13.2*  HGB 10.4* 11.0*  HCT 32.4* 33.7*  PLT 266 313   BMET Recent Labs    04/08/18 0415 04/09/18 0355  NA 141 141  K 4.0 4.1  CL 112* 112*  CO2 21* 22  GLUCOSE 102* 127*  BUN 35* 40*  CREATININE 1.18* 0.99  CALCIUM 7.3* 7.2*   PT/INR Recent Labs    04/06/18 1952  LABPROT 15.3*  INR 1.22    Studies/Results: No results found.  Anti-infectives: Anti-infectives (From admission, onward)   Start     Dose/Rate Route Frequency Ordered Stop   04/06/18 1300  meropenem (MERREM) 1 g in sodium chloride 0.9 % 100 mL IVPB     1 g 200 mL/hr over 30 Minutes Intravenous Every 12 hours 04/06/18 1145     04/06/18 1145  meropenem (MERREM) 1 g in sodium chloride 0.9 % 100 mL IVPB  Status:  Discontinued     1 g 200 mL/hr over 30 Minutes Intravenous Every 8 hours 04/06/18 1139 04/06/18 1145   04/06/18 0900  ciprofloxacin (CIPRO) IVPB 400 mg  Status:  Discontinued     400  mg 200 mL/hr over 60 Minutes Intravenous Every 12 hours 04/06/18 0732 04/06/18 1139   04/06/18 0300  metroNIDAZOLE (FLAGYL) IVPB 500 mg  Status:  Discontinued     500 mg 100 mL/hr over 60 Minutes Intravenous Every 8 hours 04/05/18 2335 04/06/18 1139   04/05/18 1930  ciprofloxacin (CIPRO) IVPB 400 mg     400 mg 200 mL/hr over 60 Minutes Intravenous  Once 04/05/18 1925 04/05/18 2217   04/05/18 1930  metroNIDAZOLE (FLAGYL) IVPB 500 mg     500 mg 100 mL/hr over 60 Minutes Intravenous  Once 04/05/18 1925 04/05/18 2102      Assessment/Plan: Impression: Perforated sigmoid diverticulitis.  Leukocytosis is resolving.  She still does not have peritoneal Braun which would warrant emergent colectomy.  Would like to avoid emergent surgery if at all possible.  Elective colectomy in the future would lessen the chances for a colostomy.  Would continue n.p.o. status.  Have adjusted her IV pain medications accordingly.  LOS: 2 days    [name] 04/09/2018

## 2018-04-10 ENCOUNTER — Other Ambulatory Visit (HOSPITAL_COMMUNITY): Payer: Medicare Other

## 2018-04-10 ENCOUNTER — Inpatient Hospital Stay (HOSPITAL_COMMUNITY): Payer: Medicare Other

## 2018-04-10 DIAGNOSIS — R9431 Abnormal electrocardiogram [ECG] [EKG]: Secondary | ICD-10-CM

## 2018-04-10 LAB — COMPREHENSIVE METABOLIC PANEL
ALK PHOS: 51 U/L (ref 38–126)
ALT: 30 U/L (ref 0–44)
AST: 41 U/L (ref 15–41)
Albumin: 2.2 g/dL — ABNORMAL LOW (ref 3.5–5.0)
Anion gap: 7 (ref 5–15)
BUN: 47 mg/dL — ABNORMAL HIGH (ref 8–23)
CALCIUM: 7.4 mg/dL — AB (ref 8.9–10.3)
CO2: 21 mmol/L — ABNORMAL LOW (ref 22–32)
CREATININE: 1.11 mg/dL — AB (ref 0.44–1.00)
Chloride: 113 mmol/L — ABNORMAL HIGH (ref 98–111)
GFR, EST AFRICAN AMERICAN: 56 mL/min — AB (ref >60)
GFR, EST NON AFRICAN AMERICAN: 48 mL/min — AB (ref >60)
Glucose, Bld: 128 mg/dL — ABNORMAL HIGH (ref 70–99)
Potassium: 4.5 mmol/L (ref 3.5–5.1)
SODIUM: 141 mmol/L (ref 135–145)
Total Bilirubin: 0.7 mg/dL (ref 0.3–1.2)
Total Protein: 5.5 g/dL — ABNORMAL LOW (ref 6.5–8.1)

## 2018-04-10 LAB — GLUCOSE, CAPILLARY
GLUCOSE-CAPILLARY: 100 mg/dL — AB (ref 70–99)
GLUCOSE-CAPILLARY: 101 mg/dL — AB (ref 70–99)
GLUCOSE-CAPILLARY: 111 mg/dL — AB (ref 70–99)
GLUCOSE-CAPILLARY: 124 mg/dL — AB (ref 70–99)
Glucose-Capillary: 114 mg/dL — ABNORMAL HIGH (ref 70–99)
Glucose-Capillary: 101 mg/dL — ABNORMAL HIGH (ref 70–99)
Glucose-Capillary: 84 mg/dL (ref 70–99)

## 2018-04-10 LAB — CULTURE, BLOOD (ROUTINE X 2)
CULTURE: NO GROWTH
CULTURE: NO GROWTH
SPECIAL REQUESTS: ADEQUATE
SPECIAL REQUESTS: ADEQUATE

## 2018-04-10 LAB — CBC
HCT: 33 % — ABNORMAL LOW (ref 36.0–46.0)
Hemoglobin: 10.9 g/dL — ABNORMAL LOW (ref 12.0–15.0)
MCH: 30.4 pg (ref 26.0–34.0)
MCHC: 33 g/dL (ref 30.0–36.0)
MCV: 92.2 fL (ref 80.0–100.0)
NRBC: 0 % (ref 0.0–0.2)
Platelets: 314 10*3/uL (ref 150–400)
RBC: 3.58 MIL/uL — AB (ref 3.87–5.11)
RDW: 17.2 % — ABNORMAL HIGH (ref 11.5–15.5)
WBC: 14.4 10*3/uL — ABNORMAL HIGH (ref 4.0–10.5)

## 2018-04-10 LAB — TROPONIN I
TROPONIN I: 0.06 ng/mL — AB (ref <0.03)
Troponin I: 0.08 ng/mL (ref <0.03)

## 2018-04-10 LAB — ECHOCARDIOGRAM COMPLETE: Weight: 4363.34 oz

## 2018-04-10 MED ORDER — BOOST / RESOURCE BREEZE PO LIQD CUSTOM
1.0000 | Freq: Three times a day (TID) | ORAL | Status: DC
  Administered 2018-04-10: 1 via ORAL

## 2018-04-10 NOTE — Progress Notes (Signed)
  Subjective: Patient denies any abdominal pain.  She also denies any chest pain.  She is passing gas.  Objective: Vital signs in last 24 hours: Temp:  [97.5 F (36.4 C)-98.2 F (36.8 C)] 97.5 F (36.4 C) (10/31 0435) Pulse Rate:  [78-183] 78 (10/30 2245) Resp:  [11-22] 12 (10/30 2245) BP: (85-126)/(39-102) 117/58 (10/30 2245) SpO2:  [90 %-98 %] 97 % (10/30 2245) Weight:  [123.7 kg] 123.7 kg (10/31 0435) Last BM Date: 04/08/18  Intake/Output from previous day: 10/30 0701 - 10/31 0700 In: 1209.4 [I.V.:1108.5; IV Piggyback:100.9] Out: 700 [Urine:700] Intake/Output this shift: No intake/output data recorded.  General appearance: alert, cooperative and no distress GI: soft, non-tender; bowel sounds normal; no masses,  no organomegaly  Lab Results:  Recent Labs    04/09/18 2139 04/10/18 0351  WBC 15.1* 14.4*  HGB 11.1* 10.9*  HCT 34.2* 33.0*  PLT 322 314   BMET Recent Labs    04/09/18 2139 04/10/18 0351  NA 139 141  K 4.6 4.5  CL 111 113*  CO2 21* 21*  GLUCOSE 165* 128*  BUN 44* 47*  CREATININE 1.13* 1.11*  CALCIUM 7.3* 7.4*   PT/INR No results for input(s): LABPROT, INR in the last 72 hours.  Studies/Results: No results found.  Anti-infectives: Anti-infectives (From admission, onward)   Start     Dose/Rate Route Frequency Ordered Stop   04/09/18 2200  meropenem (MERREM) 1 g in sodium chloride 0.9 % 100 mL IVPB     1 g 200 mL/hr over 30 Minutes Intravenous Every 8 hours 04/09/18 1244     04/06/18 1300  meropenem (MERREM) 1 g in sodium chloride 0.9 % 100 mL IVPB  Status:  Discontinued     1 g 200 mL/hr over 30 Minutes Intravenous Every 12 hours 04/06/18 1145 04/09/18 1244   04/06/18 1145  meropenem (MERREM) 1 g in sodium chloride 0.9 % 100 mL IVPB  Status:  Discontinued     1 g 200 mL/hr over 30 Minutes Intravenous Every 8 hours 04/06/18 1139 04/06/18 1145   04/06/18 0900  ciprofloxacin (CIPRO) IVPB 400 mg  Status:  Discontinued     400 mg 200 mL/hr  over 60 Minutes Intravenous Every 12 hours 04/06/18 0732 04/06/18 1139   04/06/18 0300  metroNIDAZOLE (FLAGYL) IVPB 500 mg  Status:  Discontinued     500 mg 100 mL/hr over 60 Minutes Intravenous Every 8 hours 04/05/18 2335 04/06/18 1139   04/05/18 1930  ciprofloxacin (CIPRO) IVPB 400 mg     400 mg 200 mL/hr over 60 Minutes Intravenous  Once 04/05/18 1925 04/05/18 2217   04/05/18 1930  metroNIDAZOLE (FLAGYL) IVPB 500 mg     500 mg 100 mL/hr over 60 Minutes Intravenous  Once 04/05/18 1925 04/05/18 2102      Assessment/Plan: Impression: Perforated sigmoid diverticulitis, resolving.  Leukocytosis slowly resolving.  Clinically, she has no evidence of peritonitis.  She does not need surgical intervention at this time. Events of last night noted.  She does have rising troponin levels.  LOS: 3 days    Aviva Signs 04/10/2018

## 2018-04-10 NOTE — Progress Notes (Signed)
Patient Demographics:    [name], is a 72 y.o. female, DOB - 01-04-1946, TKZ:[id number]  Admit date - 04/05/2018   Admitting Physician [name], MD  Outpatient Primary MD for the patient is [name], MD  LOS - 3   Chief Complaint  Patient presents with  . Abdominal Pain        Subjective:    [name] today has no fevers, no emesis,  No chest pain, overnight patient had episode of tachycardia, initially felt to be SVT but upon further review patient has sinus tachycardia, overall no significant shortness of breath at this time,  Assessment  & Plan :    Principal Problem:   Diverticulitis Active Problems:   DM (diabetes mellitus) (HCC)   HTN (hypertension)   Rheumatoid arthritis (HCC)   Anemia   Renal insufficiency   Sepsis due to undetermined organism (HCC)   CKD (chronic kidney disease) stage 3, GFR 30-59 ml/min (HCC)   Fibromyalgia   Diverticulitis large intestine w/o perforation or abscess w/o bleeding   Acute diverticulitis   Perforated sigmoid colon (HCC)   Acute renal failure superimposed on stage 3 chronic kidney disease (HCC)  Brief Summary- 72 year old with past medical history relevant for CAD, prior stroke/TIA, dyslipidemia morbid obesity DM2, hypertension rheumatoid arthritis fibromyalgia and chronic pain syndrome admitted 04/05/2018 with acute sigmoid diverticulitis with perforation   Plan:- 1) acute perforated sigmoid diverticulitis--- procalcitonin is down to 1.75 from 3.23, white count is 14.4 , abdominal pain persist, general surgeon Dr. Arnoldo Morale advised liquid diet,  continue IV meropenem started on 04/06/2018, patient remains n.p.o., no plans for surgical intervention at this time per general surgeon  2)AKI----acute kidney injury on CKD stage - III    creatinine on admission= 1.43 ,   baseline creatinine = 1.2 to 1.3    , creatinine is now= 0.99 (peaked at  1.81)    , renally adjust medications, avoid nephrotoxic agents/dehydration/hypotension  3)FEN----if unable to tolerate liquid diet we will consider TPN, continue normal saline with potassium at this time,   4)HTN--okay to continue to Hold isosorbide, metoprolol, also Hold olmesartan/amlodipine/HCTZ combo ,   may use IV Hydralazine 10 mg  Every 4 hours Prn for systolic blood pressure over 160 mmhg  5)DM2- .  Excellent control with A1c of 5.9, currently n.p.o., may need TPN, Use Novolog/Humalog Sliding scale insulin with Accu-Cheks/Fingersticks as ordered    6)H/o CAD/TIA--- previously on Plavix and pravastatin, may use rectal aspirin for now  7)Morbid obesity--BMI over 44 , this complicates overall care  8) sepsis--- patient met sepsis criteria on admission due to #1 above, completed stress dose steroids  hydrocortisone    9) acute on chronic anemia--- suspect some degree of anemia chronic disease in patient with rheumatoid arthritis, H&H improved after transfusion of 2 units of packed red blood cells on 04/07/2018  Disposition/Need for in-Hospital Stay- patient unable to be discharged at this time due to   Code Status : full   Disposition Plan  : TBD  Consults  :  Gen surgery   DVT Prophylaxis  :   SCDs  Lab Results  Component Value Date   PLT 314 04/10/2018    Inpatient Medications  Scheduled Meds: . Chlorhexidine Gluconate Cloth  6 each Topical Daily  . feeding supplement  1 Container Oral TID BM  . latanoprost  1 drop Both Eyes QHS  . pantoprazole  40 mg Oral Daily  . pravastatin  40 mg Oral QHS  . senna-docusate  2 tablet Oral BID  . sodium chloride flush  10-40 mL Intracatheter Q12H  . temazepam  30 mg Oral QHS  . timolol  1 drop Both Eyes BID   Continuous Infusions: . 0.9 % NaCl with KCl 20 mEq / L 100 mL/hr at 04/10/18 1733  . meropenem (MERREM) IV Stopped (04/10/18 1408)   PRN Meds:.acetaminophen **OR** acetaminophen, hydrALAZINE, HYDROmorphone (DILAUDID)  injection, LORazepam, metoprolol tartrate, ondansetron (ZOFRAN) IV, sodium chloride flush    Anti-infectives (From admission, onward)   Start     Dose/Rate Route Frequency Ordered Stop   04/09/18 2200  meropenem (MERREM) 1 g in sodium chloride 0.9 % 100 mL IVPB     1 g 200 mL/hr over 30 Minutes Intravenous Every 8 hours 04/09/18 1244     04/06/18 1300  meropenem (MERREM) 1 g in sodium chloride 0.9 % 100 mL IVPB  Status:  Discontinued     1 g 200 mL/hr over 30 Minutes Intravenous Every 12 hours 04/06/18 1145 04/09/18 1244   04/06/18 1145  meropenem (MERREM) 1 g in sodium chloride 0.9 % 100 mL IVPB  Status:  Discontinued     1 g 200 mL/hr over 30 Minutes Intravenous Every 8 hours 04/06/18 1139 04/06/18 1145   04/06/18 0900  ciprofloxacin (CIPRO) IVPB 400 mg  Status:  Discontinued     400 mg 200 mL/hr over 60 Minutes Intravenous Every 12 hours 04/06/18 0732 04/06/18 1139   04/06/18 0300  metroNIDAZOLE (FLAGYL) IVPB 500 mg  Status:  Discontinued     500 mg 100 mL/hr over 60 Minutes Intravenous Every 8 hours 04/05/18 2335 04/06/18 1139   04/05/18 1930  ciprofloxacin (CIPRO) IVPB 400 mg     400 mg 200 mL/hr over 60 Minutes Intravenous  Once 04/05/18 1925 04/05/18 2217   04/05/18 1930  metroNIDAZOLE (FLAGYL) IVPB 500 mg     500 mg 100 mL/hr over 60 Minutes Intravenous  Once 04/05/18 1925 04/05/18 2102        Objective:   Vitals:   04/10/18 1400 04/10/18 1500 04/10/18 1600 04/10/18 1700  BP: 131/71 133/81 90/76 129/85  Pulse: 97 96 96 100  Resp: 12 14 17 16   Temp:      TempSrc:      SpO2: 91% 94% 94% 93%  Weight:        Wt Readings from Last 3 Encounters:  04/10/18 123.7 kg  02/13/18 116.1 kg  01/13/18 120.7 kg     Intake/Output Summary (Last 24 hours) at 04/10/2018 1951 Last data filed at 04/10/2018 1733 Gross per 24 hour  Intake 2852.43 ml  Output -  Net 2852.43 ml     Physical Exam Patient is examined daily including today on 04/10/18 , exams remain the same as  of yesterday except that has changed   Gen:- Awake Alert, morbidly obese, uncomfortable with pain HEENT:- [place].AT, No sclera icterus Neck-Supple Neck,No JVD,.  Lungs-  CTAB , fairly symmetrical air movement CV- S1, S2 normal Abd-  +ve B.Sounds, Abd Soft, generalized abdominal tenderness worse in the left lower quadrant, no significant distention Extremity/Skin:- No  edema,   good pulses, PICC line site is clean dry and intact Psych-affect is appropriate, oriented x3 Neuro-no new focal deficits, no tremors  Data Review:   Micro Results Recent Results (from the past 240 hour(s))  Blood Culture (routine x 2)     Status: None   Collection Time: 04/05/18  7:48 PM  Result Value Ref Range Status   Specimen Description BLOOD LEFT FOREARM  Final   Special Requests   Final    BOTTLES DRAWN AEROBIC AND ANAEROBIC Blood Culture adequate volume   Culture   Final    NO GROWTH 5 DAYS Performed at [facility], [address], [place], [place] [address]    Report Status 04/10/2018 FINAL  Final  Blood Culture (routine x 2)     Status: None   Collection Time: 04/05/18  7:57 PM  Result Value Ref Range Status   Specimen Description RIGHT ANTECUBITAL  Final   Special Requests   Final    BOTTLES DRAWN AEROBIC ONLY Blood Culture adequate volume   Culture   Final    NO GROWTH 5 DAYS Performed at [facility], [address], [place], [place] [address]    Report Status 04/10/2018 FINAL  Final  MRSA PCR Screening     Status: None   Collection Time: 04/07/18  9:08 PM  Result Value Ref Range Status   MRSA by PCR NEGATIVE NEGATIVE Final    Comment:        The GeneXpert MRSA Assay (FDA approved for NASAL specimens only), is one component of a comprehensive MRSA colonization surveillance program. It is not intended to diagnose MRSA infection nor to guide or monitor treatment for MRSA infections. Performed at [facility], [address] [place], [place] [address]     Radiology Reports Ct  Abdomen Pelvis Wo Contrast  Result Date: 04/06/2018 CLINICAL DATA:  72 year old female with concern for perforated diverticulitis. EXAM: CT ABDOMEN AND PELVIS WITHOUT CONTRAST TECHNIQUE: Multidetector CT imaging of the abdomen and pelvis was performed following the standard protocol without IV contrast. COMPARISON:  Chest radiograph dated 04/06/2018 and CT dated 04/05/2018 FINDINGS: Evaluation of this exam is limited in the absence of intravenous contrast. Lower chest: Diffuse interstitial coarsening and streaky atelectasis/scarring of the lung bases. There is mild cardiomegaly. There is hypoattenuation of the cardiac blood pool suggestive of a degree of anemia. Clinical correlation is recommended. There is pneumoperitoneum and small amount of free fluid within the pelvis. Hepatobiliary: Advanced fatty infiltration of the liver. No intrahepatic biliary ductal dilatation. Cholecystectomy. Pancreas: Unremarkable. No pancreatic ductal dilatation or surrounding inflammatory changes. Spleen: Normal in size without focal abnormality. Adrenals/Urinary Tract: The adrenal glands are unremarkable. There is no hydronephrosis on either side. Excreted contrast in the renal collecting systems bilaterally. The visualized ureters appear unremarkable. There is trabeculated appearance of the bladder wall likely related to chronic bladder dysfunction. Correlation with urinalysis recommended to exclude cystitis. Stomach/Bowel: There is extensive sigmoid diverticulosis. There is associated inflammatory changes and perforation of the sigmoid colon. There is increased size of the extraluminal air since the prior CT. No drainable fluid collection is noted at this time. There is no bowel obstruction. Normal appendix. Vascular/Lymphatic: There is advanced aortoiliac atherosclerotic disease. No portal venous gas. There is no adenopathy. Reproductive: The uterus is grossly unremarkable. Other: None Musculoskeletal: Degenerative changes of  the spine. No acute osseous pathology. IMPRESSION: 1. Perforated sigmoid diverticulitis with increased size of the extraluminal air since the prior CT. No drainable fluid collection noted at this time. 2. No bowel obstruction. Normal appendix. 3. Advanced fatty infiltration of the liver. These results were called by telephone at the time of  interpretation on 04/06/2018 at 11:09 pm to nurse Hearn, who verbally acknowledged these results. Electronically Signed   By: Anner Crete M.D.   On: 04/06/2018 23:25   Dg Chest 2 View  Result Date: 04/06/2018 CLINICAL DATA:  Dyspnea, sepsis, pt listless and unable to cooperate EXAM: CHEST - 2 VIEW COMPARISON:  06/27/2016 and 06/18/2016. FINDINGS: On the lateral view, there is evidence of a small amount free intraperitoneal air under the left hemidiaphragm. Lung volumes are low. There is opacity at the bases that is likely due to atelectasis along with bronchovascular crowding. No convincing pneumonia and no evidence of pulmonary edema. 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 Cardiac silhouette is normal in size. No mediastinal hilar masses. No pleural effusion.  No pneumothorax. Skeletal structures are grossly intact. IMPRESSION: 1. Small amount of free intraperitoneal air. Recommend follow-up abdomen and pelvis CT with contrast for further assessment. 2. Somewhat limited study due to low lung volumes. Allowing for this, no acute cardiopulmonary disease. Electronically Signed   By: Lajean Manes M.D.   On: 04/06/2018 11:23   Ct Abdomen Pelvis W Contrast  Result Date: 04/05/2018 CLINICAL DATA:  Patient has bilateral lower abdominal sharp intermittent pain with nausea without vomiting. H/x diverticulitis patient states pain and symptoms similar to past. EXAM: CT ABDOMEN AND PELVIS WITH CONTRAST TECHNIQUE: Multidetector CT imaging of the abdomen and pelvis was performed using the standard protocol following bolus administration of intravenous contrast. CONTRAST:  34m  ISOVUE-300 IOPAMIDOL (ISOVUE-300) INJECTION 61% COMPARISON:  02/06/2018 FINDINGS: Lower chest: There is scarring in both lung bases. Coronary artery calcifications are present. The heart is normal in size. Hepatobiliary: Liver is diffusely low attenuation. No focal liver lesions. Cholecystectomy. Pancreas: Unremarkable. No pancreatic ductal dilatation or surrounding inflammatory changes. Spleen: Normal in size without focal abnormality. Adrenals/Urinary Tract: Adrenal glands are normal. There is symmetric enhancement and excretion from the kidneys. 1 millimeter intrarenal calcification identified in the LOWER pole of the RIGHT kidney, not associated with obstruction. Ureters are unremarkable. The bladder and visualized portion of the urethra are normal. Stomach/Bowel: The stomach is normal in appearance. Small bowel loops are normal in caliber and wall thickness. The appendix is well seen and has a normal appearance. There is significant inflammatory change in the region of sigmoid colon and associated numerous diverticula in this segment. Fluid extends in the sigmoid mesentery. The diseased segment of sigmoid is adjacent to the uterus and the urinary bladder. No evidence for abscess or perforation. Vascular/Lymphatic: There is atherosclerotic calcification of the abdominal aorta. There is normal vascular opacification of the celiac axis, superior mesenteric artery, and inferior mesenteric artery. Normal appearance of the portal venous system and inferior vena cava. Reproductive: The uterus is present and is surrounded by inflammatory changes from the sigmoid diverticulitis. No adnexal mass. Other: Trace amount of free pelvic fluid. Musculoskeletal: Incompletely imaged fluid attenuation collection within the LEFT hip abductors, measuring at least 7.3 x 4.8 centimeters. The appearance is stable since 2017. There are degenerative changes in the lumbar spine with 7 millimeters anterolisthesis of L4 on L5. Median  sternotomy. IMPRESSION: 1. Acute sigmoid diverticulitis with significant fluid in the sigmoid mesentery. There is no evidence for perforation or abscess. 2. Coronary artery calcifications. 3. Hepatic steatosis.  Cholecystectomy. 4. Nonobstructing intrarenal calculus in the LOWER pole the RIGHT kidney. 5. Normal appendix. 6.  Aortic atherosclerosis. 7. Probably benign ganglion cyst within the LEFT hip abductors. Electronically Signed   By: ENolon NationsM.D.   On: 04/05/2018 22:06  US Arterial Abi (screening Lower Extremity)  Result Date: 03/19/2018 CLINICAL DATA:  72 year old female with chronic bilateral lower extremity pain EXAM: NONINVASIVE PHYSIOLOGIC VASCULAR STUDY OF BILATERAL LOWER EXTREMITIES TECHNIQUE: Evaluation of both lower extremities were performed at rest, including calculation of ankle-brachial indices with single level Doppler, pressure and pulse volume recording. COMPARISON:  None. FINDINGS: Right ABI:  1.2 Left ABI:  1.1 Right Lower Extremity:  Normal arterial waveforms at the ankle. Left Lower Extremity:  Normal arterial waveforms at the ankle. 1.0-1.4 Normal IMPRESSION: Normal examination. No evidence of hemodynamically significant peripheral arterial disease. Signed, Criselda Peaches, MD, [facility] Radiology Electronically Signed   By: Jacqulynn Cadet M.D.   On: 03/19/2018 16:28     CBC Recent Labs  Lab 04/05/18 1948  04/07/18 0450 04/08/18 0415 04/09/18 0355 04/09/18 2139 04/10/18 0351  WBC 13.8*   < > 13.6* 15.9* 13.2* 15.1* 14.4*  HGB 10.0*   < > 7.7* 10.4* 11.0* 11.1* 10.9*  HCT 31.9*   < > 25.0* 32.4* 33.7* 34.2* 33.0*  PLT 353   < > 246 266 313 322 314  MCV 97.6   < > 99.2 93.6 91.1 91.2 92.2  MCH 30.6   < > 30.6 30.1 29.7 29.6 30.4  MCHC 31.3   < > 30.8 32.1 32.6 32.5 33.0  RDW 16.8*   < > 17.0* 17.1* 17.2* 17.4* 17.2*  LYMPHSABS 0.5*  --   --   --   --   --   --   MONOABS 0.5  --   --   --   --    --   --   EOSABS 0.1  --   --   --   --   --   --   BASOSABS 0.1  --   --   --   --   --   --    < > = values in this interval not displayed.    Chemistries  Recent Labs  Lab 04/05/18 1948  04/06/18 0625  04/07/18 0450 04/08/18 0415 04/09/18 0355 04/09/18 2139 04/10/18 0351  NA 136   < > 136   < > 138 141 141 139 141  K 3.7   < > 3.4*   < > 3.9 4.0 4.1 4.6 4.5  CL 102   < > 104   < > 108 112* 112* 111 113*  CO2 26  --  25   < > 21* 21* 22 21* 21*  GLUCOSE 102*   < > 106*   < > 84 102* 127* 165* 128*  BUN 33*   < > 28*   < > 33* 35* 40* 44* 47*  CREATININE 1.43*   < > 1.38*   < > 1.65* 1.18* 0.99 1.13* 1.11*  CALCIUM 8.7*  --  7.8*   < > 7.2* 7.3* 7.2* 7.3* 7.4*  MG  --   --   --   --  1.3* 1.9  --  1.8  --   AST 75*  --  58*  --   --  46* 54*  --  41  ALT 43  --  38  --   --  33 36  --  30  ALKPHOS 59  --  45  --   --  48 55  --  51  BILITOT 0.7  --  0.6  --   --  0.6 0.5  --  0.7   < > =  values in this interval not displayed.   ------------------------------------------------------------------------------------------------------------------ No results for input(s): CHOL, HDL, LDLCALC, TRIG, CHOLHDL, LDLDIRECT in the last 72 hours.  Lab Results  Component Value Date   HGBA1C 5.9 (H) 04/07/2018   ------------------------------------------------------------------------------------------------------------------ No results for input(s): TSH, T4TOTAL, T3FREE, THYROIDAB in the last 72 hours.  Invalid input(s): FREET3 ------------------------------------------------------------------------------------------------------------------ No results for input(s): VITAMINB12, FOLATE, FERRITIN, TIBC, IRON, RETICCTPCT in the last 72 hours.  Coagulation profile Recent Labs  Lab 04/06/18 1952  INR 1.22    No results for input(s): DDIMER in the last 72 hours.  Cardiac Enzymes Recent Labs  Lab 04/09/18 2139 04/10/18 0351 04/10/18 0945  TROPONINI 0.07* 0.08* 0.06*    ------------------------------------------------------------------------------------------------------------------    Component Value Date/Time   BNP 34.0 06/14/2014 1415    Byanca Kasper M.D on 04/10/2018 at 7:51 PM  Pager---[phone] Go to [email or web address] - password [email or web address] for contact info  [facility] - Office  [phone]

## 2018-04-10 NOTE — Progress Notes (Signed)
Echocardiogram 2D Echocardiogram has been performed.  [name] 04/10/2018, 4:05 PM

## 2018-04-11 ENCOUNTER — Inpatient Hospital Stay (HOSPITAL_COMMUNITY): Payer: Medicare Other

## 2018-04-11 HISTORY — PX: OTHER SURGICAL HISTORY: SHX169

## 2018-04-11 LAB — BASIC METABOLIC PANEL
Anion gap: 8 (ref 5–15)
BUN: 44 mg/dL — ABNORMAL HIGH (ref 8–23)
CALCIUM: 7.7 mg/dL — AB (ref 8.9–10.3)
CO2: 20 mmol/L — AB (ref 22–32)
CREATININE: 0.96 mg/dL (ref 0.44–1.00)
Chloride: 115 mmol/L — ABNORMAL HIGH (ref 98–111)
GFR calc Af Amer: >60 mL/min (ref >60)
GFR calc non Af Amer: 57 mL/min — ABNORMAL LOW (ref >60)
GLUCOSE: 86 mg/dL (ref 70–99)
Potassium: 4.6 mmol/L (ref 3.5–5.1)
Sodium: 143 mmol/L (ref 135–145)

## 2018-04-11 LAB — CBC
HEMATOCRIT: 36.6 % (ref 36.0–46.0)
HEMOGLOBIN: 11.3 g/dL — AB (ref 12.0–15.0)
MCH: 29.3 pg (ref 26.0–34.0)
MCHC: 30.9 g/dL (ref 30.0–36.0)
MCV: 94.8 fL (ref 80.0–100.0)
Platelets: 273 10*3/uL (ref 150–400)
RBC: 3.86 MIL/uL — ABNORMAL LOW (ref 3.87–5.11)
RDW: 18.2 % — AB (ref 11.5–15.5)
WBC: 11.1 10*3/uL — ABNORMAL HIGH (ref 4.0–10.5)
nRBC: 0 % (ref 0.0–0.2)

## 2018-04-11 LAB — GLUCOSE, CAPILLARY
GLUCOSE-CAPILLARY: 80 mg/dL (ref 70–99)
Glucose-Capillary: 82 mg/dL (ref 70–99)
Glucose-Capillary: 80 mg/dL (ref 70–99)
Glucose-Capillary: 77 mg/dL (ref 70–99)
Glucose-Capillary: 77 mg/dL (ref 70–99)

## 2018-04-11 MED ORDER — ENSURE ENLIVE PO LIQD
237.0000 mL | Freq: Three times a day (TID) | ORAL | Status: DC
  Administered 2018-04-11 – 2018-04-14 (×5): 237 mL via ORAL

## 2018-04-11 MED ORDER — ALBUTEROL SULFATE (2.5 MG/3ML) 0.083% IN NEBU: 2.5000 mg | INHALATION_SOLUTION | Freq: Once | RESPIRATORY_TRACT | Status: DC

## 2018-04-11 MED ORDER — METOPROLOL TARTRATE 25 MG PO TABS
12.5000 mg | ORAL_TABLET | Freq: Two times a day (BID) | ORAL | Status: DC
  Administered 2018-04-11 – 2018-04-13 (×4): 12.5 mg via ORAL
  Filled 2018-04-11 (×4): qty 1

## 2018-04-11 NOTE — Progress Notes (Signed)
Nutrition Follow-up  DOCUMENTATION CODES:  Morbid obesity  INTERVENTION:  D/C Boost Breeze  Ensure Enlive po TID, each supplement provides 350 kcal and 20 grams of protein  If pt is unable to tolerate FL diet, would recommend initiation of TPN given prolonged period w/o nutrition.   NUTRITION DIAGNOSIS:  Inadequate oral intake related to inability to eat as evidenced by NPO/CL until just now.  Ongoing  GOAL:  Patient will meet greater than or equal to 90% of their needs   Not met  MONITOR:  PO intake, Supplement acceptance, Diet advancement, Labs, Weight trends, I & O's  ASSESSMENT:  72 y/o female PMHx CAD, HLD/HTN, CVA, DM2, RA, Fibromyalgia/chronic pain. Presented w/ 2 weeks of lower abd pain. Had recently been dx w/ UTI and palced on abx, but pain continued. Initial CT scan showed diverticulitis w/o perf, but pt developed hypotension and repeat CT did show perforation. Surg consulted.   Interim hx since 10/28. Pt remained NPO up until 10/26-10/31 10/31: 8oz  CL at [employer] and [place]  11/1: advance to [facility] after Breakfast.    Pt went approximately 5d w/o nutrition. She says she did not experience any significant pain or intolerance upon diet advancement to clears. However, she apparently only consumed little amounts d/t not liking broth ("too salty").  Husband is at bedside and reports she has had colicky like abdominal pain every couple hours, though nurse notes she only reports this when family is in room. No pain with palpation. Surgeon has advanced diet given tolerance to clears. She did not like the breeze. She is agreeable to Ensure. RD urged her to consume this slowly given how long she has gone without nutrition, these may cause GI upset due to high fat content.   Will change to ensure and monitor diet tolreance  Spouse was able to provide further information regarding pt hx PTA. She says pt was eating 50% of her normal for ~1 month PTA. She was eating 2 meals, which is  baseline, but portions were 50% normal.   Wt wise, she had said her UBW is 275 lbs. Per chart, she was 266 lbs in July. Admit wt: 260.4 lbs. PTA. It appears she had been between 265-275 lbs for several years. Current weight is 281 lbs, indicating significant fluid gain since arrival. She is noticeable more edematous today in upper body. Albumin has been decreasing    Labs: WBC:11.1 BUN/Creat:44/.96, Albumin: 2.2 (2.7 on admit), BGs 80-110 Meds:  senna, PPI, IVF, IV abx, PRN opioids, IVF w/ K  Recent Labs  Lab 04/07/18 0450 04/08/18 0415  04/09/18 2139 04/10/18 0351 04/11/18 0920  NA 138 141   < > 139 141 143  K 3.9 4.0   < > 4.6 4.5 4.6  CL 108 112*   < > 111 113* 115*  CO2 21* 21*   < > 21* 21* 20*  BUN 33* 35*   < > 44* 47* 44*  CREATININE 1.65* 1.18*   < > 1.13* 1.11* 0.96  CALCIUM 7.2* 7.3*   < > 7.3* 7.4* 7.7*  MG 1.3* 1.9  --  1.8  --   --   PHOS 3.5 2.9  --   --   --   --   GLUCOSE 84 102*   < > 165* 128* 86   < > = values in this interval not displayed.   Diet Order:   Diet Order            Diet full  liquid Room service appropriate? Yes; Fluid consistency: Thin  Diet effective now             EDUCATION NEEDS:  No education needs have been identified at this time  Skin:  Skin Assessment: Reviewed RN Assessment  Last BM:  10/29   Height:  Ht Readings from Last 1 Encounters:  02/13/18 _0  (1.651 m)   Weight:  Wt Readings from Last 1 Encounters:  04/11/18 127.4 kg   Wt Readings from Last 10 Encounters:  04/11/18 127.4 kg  02/13/18 116.1 kg  01/13/18 120.7 kg  12/24/17 121 kg  09/04/17 122.5 kg  07/08/17 124.7 kg  06/26/17 124.7 kg  09/17/16 120.2 kg  09/13/16 120.2 kg  06/27/16 120 kg   Ideal Body Weight:  56.82 kg  BMI:  Body mass index is 46.74 kg/m.  Estimated Nutritional Needs:  Kcal:  1600-1800 (MSJ +/- 100) Protein:  85-100g Pro (1.5-1.8g/kg ibw) Fluid:  1.6-1.8 L fluid (30m/kcal)  NBurtis JunesRD, LDN, CNSC Clinical  Nutrition Available Tues-Sat via Pager: [phone]/06/2017 11:23 AM

## 2018-04-11 NOTE — Progress Notes (Signed)
Picc line noted to be 1 cm out and dressing undone this morning. Expiratory wheezes present during assessment. Dr. Denton Brick notified. Fluids stopped and portable cxr ordered.

## 2018-04-11 NOTE — Progress Notes (Signed)
Patient Demographics:    [name], is a 72 y.o. female, DOB - Mar 01, 1946, KVQ:[id number]  Admit date - 04/05/2018   Admitting Physician [name], MD  Outpatient Primary MD for the patient is [name], MD  LOS - 4   Chief Complaint  Patient presents with  . Abdominal Pain        Subjective:    [name] today has no fevers, no emesis,  No chest pain, no significant acute events overnight, tolerating clear liquid diet well okay, no BM, no significant flatus  Assessment  & Plan :    Principal Problem:   Diverticulitis Active Problems:   DM (diabetes mellitus) (HCC)   HTN (hypertension)   Rheumatoid arthritis (HCC)   Anemia   Renal insufficiency   Sepsis due to undetermined organism (HCC)   CKD (chronic kidney disease) stage 3, GFR 30-59 ml/min (HCC)   Fibromyalgia   Diverticulitis large intestine w/o perforation or abscess w/o bleeding   Acute diverticulitis   Perforated sigmoid colon (HCC)   Acute renal failure superimposed on stage 3 chronic kidney disease (HCC)  Brief Summary- 72 year old with past medical history relevant for CAD, prior stroke/TIA, dyslipidemia morbid obesity DM2, hypertension rheumatoid arthritis fibromyalgia and chronic pain syndrome admitted 04/05/2018 with acute sigmoid diverticulitis with perforation   Plan:- 1) acute perforated sigmoid diverticulitis--- abdominal pain is not worse, tolerating clear liquid diet, general surgeon advises advancing diet to full liquid, white count is down to 11.1 from 15.9, procalcitonin is down to 1.75 from 3.23,  continue IV meropenem started on 04/06/2018,  no plans for surgical intervention at this time per general surgeon  2)AKI----acute kidney injury on CKD stage - III    creatinine on admission= 1.43 ,   baseline creatinine = 1.2 to 1.3    , creatinine is now= 0.9 (peaked at 1.81)    , renally adjust medications,  avoid nephrotoxic agents/dehydration/hypotension  3)FEN----advance to full liquid diet as recommended by general surgeon , if unable to tolerate liquid diet we will consider TPN, decrease normal saline to 40 mL an hour  4)HTN--okay to continue to Hold isosorbide, metoprolol, also Hold olmesartan/amlodipine/HCTZ combo ,   may use IV Hydralazine 10 mg  Every 4 hours Prn for systolic blood pressure over 160 mmhg  5)DM2- .  Excellent control with A1c of 5.9, currently n.p.o., may need TPN, Use Novolog/Humalog Sliding scale insulin with Accu-Cheks/Fingersticks as ordered    6)H/o CAD/TIA--- previously on Plavix and pravastatin, restart Plavix as no surgical procedures planned  7)Morbid obesity--BMI over 44 , this complicates overall care  8) sepsis--- patient met sepsis criteria on admission due to #1 above, completed stress dose steroids  hydrocortisone    9) acute on chronic anemia--- suspect some degree of anemia chronic disease in patient with rheumatoid arthritis, H&H improved and currently stable at 11.3 after transfusion of 2 units of packed red blood cells on 04/07/2018  Disposition/Need for in-Hospital Stay- patient unable to be discharged at this time due to need for IV antibiotics for perforated diverticulitis, need for IV fluids due to poor oral intake in the setting of perforated viscus  Code Status : full   Disposition Plan  : TBD  Consults  :  Gen surgery  DVT Prophylaxis  :   SCDs  Lab Results  Component Value Date   PLT 273 04/11/2018    Inpatient Medications  Scheduled Meds: . albuterol  2.5 mg Nebulization Once  . Chlorhexidine Gluconate Cloth  6 each Topical Daily  . feeding supplement (ENSURE ENLIVE)  237 mL Oral TID BM  . latanoprost  1 drop Both Eyes QHS  . metoprolol tartrate  12.5 mg Oral BID  . pantoprazole  40 mg Oral Daily  . pravastatin  40 mg Oral QHS  . senna-docusate  2 tablet Oral BID  . sodium chloride flush  10-40 mL Intracatheter Q12H  .  temazepam  30 mg Oral QHS  . timolol  1 drop Both Eyes BID   Continuous Infusions: . 0.9 % NaCl with KCl 20 mEq / L 100 mL/hr at 04/11/18 1622  . meropenem (MERREM) IV Stopped (04/11/18 1336)   PRN Meds:.acetaminophen **OR** acetaminophen, hydrALAZINE, HYDROmorphone (DILAUDID) injection, LORazepam, ondansetron (ZOFRAN) IV, sodium chloride flush    Anti-infectives (From admission, onward)   Start     Dose/Rate Route Frequency Ordered Stop   04/09/18 2200  meropenem (MERREM) 1 g in sodium chloride 0.9 % 100 mL IVPB     1 g 200 mL/hr over 30 Minutes Intravenous Every 8 hours 04/09/18 1244     04/06/18 1300  meropenem (MERREM) 1 g in sodium chloride 0.9 % 100 mL IVPB  Status:  Discontinued     1 g 200 mL/hr over 30 Minutes Intravenous Every 12 hours 04/06/18 1145 04/09/18 1244   04/06/18 1145  meropenem (MERREM) 1 g in sodium chloride 0.9 % 100 mL IVPB  Status:  Discontinued     1 g 200 mL/hr over 30 Minutes Intravenous Every 8 hours 04/06/18 1139 04/06/18 1145   04/06/18 0900  ciprofloxacin (CIPRO) IVPB 400 mg  Status:  Discontinued     400 mg 200 mL/hr over 60 Minutes Intravenous Every 12 hours 04/06/18 0732 04/06/18 1139   04/06/18 0300  metroNIDAZOLE (FLAGYL) IVPB 500 mg  Status:  Discontinued     500 mg 100 mL/hr over 60 Minutes Intravenous Every 8 hours 04/05/18 2335 04/06/18 1139   04/05/18 1930  ciprofloxacin (CIPRO) IVPB 400 mg     400 mg 200 mL/hr over 60 Minutes Intravenous  Once 04/05/18 1925 04/05/18 2217   04/05/18 1930  metroNIDAZOLE (FLAGYL) IVPB 500 mg     500 mg 100 mL/hr over 60 Minutes Intravenous  Once 04/05/18 1925 04/05/18 2102        Objective:   Vitals:   04/11/18 1503 04/11/18 1600 04/11/18 1622 04/11/18 1700  BP: (!) 106/57 129/85  93/65  Pulse: 95 96  (!) 102  Resp: 13 18  12   Temp:   97.8 F (36.6 C)   TempSrc:   Oral   SpO2: 97% 98%  97%  Weight:        Wt Readings from Last 3 Encounters:  04/11/18 127.4 kg  02/13/18 116.1 kg  01/13/18  120.7 kg     Intake/Output Summary (Last 24 hours) at 04/11/2018 1749 Last data filed at 04/11/2018 1356 Gross per 24 hour  Intake 1976.27 ml  Output -  Net 1976.27 ml     Physical Exam Patient is examined daily including today on 04/11/18 , exams remain the same as of yesterday except that has changed   Gen:- Awake Alert, morbidly obese, no acute distress HEENT:- [place].AT, No sclera icterus Neck-Supple Neck,No JVD,.  Lungs-  CTAB , fairly  symmetrical air movement CV- S1, S2 normal Abd-  +ve B.Sounds, Abd Soft, generalized abdominal tenderness worse in the left lower quadrant, no significant distention, no acute abdomen likely Extremity/Skin:- No  edema,   good pulses, left arm PICC line site is clean dry and intact Psych-affect is appropriate, oriented x3 Neuro-no new focal deficits, no tremors   Data Review:   Micro Results Recent Results (from the past 240 hour(s))  Blood Culture (routine x 2)     Status: None   Collection Time: 04/05/18  7:48 PM  Result Value Ref Range Status   Specimen Description BLOOD LEFT FOREARM  Final   Special Requests   Final    BOTTLES DRAWN AEROBIC AND ANAEROBIC Blood Culture adequate volume   Culture   Final    NO GROWTH 5 DAYS Performed at [facility], [address] [place], [place] [address]    Report Status 04/10/2018 FINAL  Final  Blood Culture (routine x 2)     Status: None   Collection Time: 04/05/18  7:57 PM  Result Value Ref Range Status   Specimen Description RIGHT ANTECUBITAL  Final   Special Requests   Final    BOTTLES DRAWN AEROBIC ONLY Blood Culture adequate volume   Culture   Final    NO GROWTH 5 DAYS Performed at [facility], [address], [place], [place] [address]    Report Status 04/10/2018 FINAL  Final  MRSA PCR Screening     Status: None   Collection Time: 04/07/18  9:08 PM  Result Value Ref Range Status   MRSA by PCR NEGATIVE NEGATIVE Final    Comment:        The GeneXpert MRSA Assay (FDA approved for NASAL  specimens only), is one component of a comprehensive MRSA colonization surveillance program. It is not intended to diagnose MRSA infection nor to guide or monitor treatment for MRSA infections. Performed at [facility], [address] [place], [place] [address]     Radiology Reports Ct Abdomen Pelvis Wo Contrast  Result Date: 04/06/2018 CLINICAL DATA:  72 year old female with concern for perforated diverticulitis. EXAM: CT ABDOMEN AND PELVIS WITHOUT CONTRAST TECHNIQUE: Multidetector CT imaging of the abdomen and pelvis was performed following the standard protocol without IV contrast. COMPARISON:  Chest radiograph dated 04/06/2018 and CT dated 04/05/2018 FINDINGS: Evaluation of this exam is limited in the absence of intravenous contrast. Lower chest: Diffuse interstitial coarsening and streaky atelectasis/scarring of the lung bases. There is mild cardiomegaly. There is hypoattenuation of the cardiac blood pool suggestive of a degree of anemia. Clinical correlation is recommended. There is pneumoperitoneum and small amount of free fluid within the pelvis. Hepatobiliary: Advanced fatty infiltration of the liver. No intrahepatic biliary ductal dilatation. Cholecystectomy. Pancreas: Unremarkable. No pancreatic ductal dilatation or surrounding inflammatory changes. Spleen: Normal in size without focal abnormality. Adrenals/Urinary Tract: The adrenal glands are unremarkable. There is no hydronephrosis on either side. Excreted contrast in the renal collecting systems bilaterally. The visualized ureters appear unremarkable. There is trabeculated appearance of the bladder wall likely related to chronic bladder dysfunction. Correlation with urinalysis recommended to exclude cystitis. Stomach/Bowel: There is extensive sigmoid diverticulosis. There is associated inflammatory changes and perforation of the sigmoid colon. There is increased size of the extraluminal air since the prior CT. No drainable fluid  collection is noted at this time. There is no bowel obstruction. Normal appendix. Vascular/Lymphatic: There is advanced aortoiliac atherosclerotic disease. No portal venous gas. There is no adenopathy. Reproductive: The uterus is grossly  unremarkable. Other: None Musculoskeletal: Degenerative changes of the spine. No acute osseous pathology. IMPRESSION: 1. Perforated sigmoid diverticulitis with increased size of the extraluminal air since the prior CT. No drainable fluid collection noted at this time. 2. No bowel obstruction. Normal appendix. 3. Advanced fatty infiltration of the liver. These results were called by telephone at the time of interpretation on 04/06/2018 at 11:09 pm to nurse Hearn, who verbally acknowledged these results. Electronically Signed   By: Anner Crete M.D.   On: 04/06/2018 23:25   Dg Chest 2 View  Result Date: 04/06/2018 CLINICAL DATA:  Dyspnea, sepsis, pt listless and unable to cooperate EXAM: CHEST - 2 VIEW COMPARISON:  06/27/2016 and 06/18/2016. FINDINGS: On the lateral view, there is evidence of a small amount free intraperitoneal air under the left hemidiaphragm. Lung volumes are low. There is opacity at the bases that is likely due to atelectasis along with bronchovascular crowding. No convincing pneumonia and no evidence of pulmonary edema. 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 Cardiac silhouette is normal in size. No mediastinal hilar masses. No pleural effusion.  No pneumothorax. Skeletal structures are grossly intact. IMPRESSION: 1. Small amount of free intraperitoneal air. Recommend follow-up abdomen and pelvis CT with contrast for further assessment. 2. Somewhat limited study due to low lung volumes. Allowing for this, no acute cardiopulmonary disease. Electronically Signed   By: Lajean Manes M.D.   On: 04/06/2018 11:23   Ct Abdomen Pelvis W Contrast  Result Date: 04/05/2018 CLINICAL DATA:  Patient has bilateral lower abdominal sharp intermittent pain with nausea  without vomiting. H/x diverticulitis patient states pain and symptoms similar to past. EXAM: CT ABDOMEN AND PELVIS WITH CONTRAST TECHNIQUE: Multidetector CT imaging of the abdomen and pelvis was performed using the standard protocol following bolus administration of intravenous contrast. CONTRAST:  26m ISOVUE-300 IOPAMIDOL (ISOVUE-300) INJECTION 61% COMPARISON:  02/06/2018 FINDINGS: Lower chest: There is scarring in both lung bases. Coronary artery calcifications are present. The heart is normal in size. Hepatobiliary: Liver is diffusely low attenuation. No focal liver lesions. Cholecystectomy. Pancreas: Unremarkable. No pancreatic ductal dilatation or surrounding inflammatory changes. Spleen: Normal in size without focal abnormality. Adrenals/Urinary Tract: Adrenal glands are normal. There is symmetric enhancement and excretion from the kidneys. 1 millimeter intrarenal calcification identified in the LOWER pole of the RIGHT kidney, not associated with obstruction. Ureters are unremarkable. The bladder and visualized portion of the urethra are normal. Stomach/Bowel: The stomach is normal in appearance. Small bowel loops are normal in caliber and wall thickness. The appendix is well seen and has a normal appearance. There is significant inflammatory change in the region of sigmoid colon and associated numerous diverticula in this segment. Fluid extends in the sigmoid mesentery. The diseased segment of sigmoid is adjacent to the uterus and the urinary bladder. No evidence for abscess or perforation. Vascular/Lymphatic: There is atherosclerotic calcification of the abdominal aorta. There is normal vascular opacification of the celiac axis, superior mesenteric artery, and inferior mesenteric artery. Normal appearance of the portal venous system and inferior vena cava. Reproductive: The uterus is present and is surrounded by inflammatory changes from the sigmoid diverticulitis. No adnexal mass. Other: Trace amount of  free pelvic fluid. Musculoskeletal: Incompletely imaged fluid attenuation collection within the LEFT hip abductors, measuring at least 7.3 x 4.8 centimeters. The appearance is stable since 2017. There are degenerative changes in the lumbar spine with 7 millimeters anterolisthesis of L4 on L5. Median sternotomy. IMPRESSION: 1. Acute sigmoid diverticulitis with significant fluid in the sigmoid mesentery.  There is no evidence for perforation or abscess. 2. Coronary artery calcifications. 3. Hepatic steatosis.  Cholecystectomy. 4. Nonobstructing intrarenal calculus in the LOWER pole the RIGHT kidney. 5. Normal appendix. 6.  Aortic atherosclerosis. 7. Probably benign ganglion cyst within the LEFT hip abductors. Electronically Signed   By: Nolon Nations M.D.   On: 04/05/2018 22:06   US Arterial Abi (screening Lower Extremity)  Result Date: 03/19/2018 CLINICAL DATA:  72 year old female with chronic bilateral lower extremity pain EXAM: NONINVASIVE PHYSIOLOGIC VASCULAR STUDY OF BILATERAL LOWER EXTREMITIES TECHNIQUE: Evaluation of both lower extremities were performed at rest, including calculation of ankle-brachial indices with single level Doppler, pressure and pulse volume recording. COMPARISON:  None. FINDINGS: Right ABI:  1.2 Left ABI:  1.1 Right Lower Extremity:  Normal arterial waveforms at the ankle. Left Lower Extremity:  Normal arterial waveforms at the ankle. 1.0-1.4 Normal IMPRESSION: Normal examination. No evidence of hemodynamically significant peripheral arterial disease. Signed, Criselda Peaches, MD, [facility] Radiology Electronically Signed   By: Jacqulynn Cadet M.D.   On: 03/19/2018 16:28   Dg Chest Port 1 View  Result Date: 04/11/2018 CLINICAL DATA:  PICC placement. EXAM: PORTABLE CHEST 1 VIEW COMPARISON:  Chest radiograph 04/06/2018. FINDINGS: Cardiomegaly.  Mild vascular congestion.  Prior CABG. RIGHT arm PICC line tip appears to lie  distal SVC. This is marked with an arrow. No pneumothorax. IMPRESSION: RIGHT arm PICC line tip appears to lie distal SVC. No pneumothorax. Cardiomegaly with mild vascular congestion, stable from priors. Electronically Signed   By: Staci Righter M.D.   On: 04/11/2018 09:34     CBC Recent Labs  Lab 04/05/18 1948  04/08/18 0415 04/09/18 0355 04/09/18 2139 04/10/18 0351 04/11/18 0920  WBC 13.8*   < > 15.9* 13.2* 15.1* 14.4* 11.1*  HGB 10.0*   < > 10.4* 11.0* 11.1* 10.9* 11.3*  HCT 31.9*   < > 32.4* 33.7* 34.2* 33.0* 36.6  PLT 353   < > 266 313 322 314 273  MCV 97.6   < > 93.6 91.1 91.2 92.2 94.8  MCH 30.6   < > 30.1 29.7 29.6 30.4 29.3  MCHC 31.3   < > 32.1 32.6 32.5 33.0 30.9  RDW 16.8*   < > 17.1* 17.2* 17.4* 17.2* 18.2*  LYMPHSABS 0.5*  --   --   --   --   --   --   MONOABS 0.5  --   --   --   --   --   --   EOSABS 0.1  --   --   --   --   --   --   BASOSABS 0.1  --   --   --   --   --   --    < > = values in this interval not displayed.    Chemistries  Recent Labs  Lab 04/05/18 1948  04/06/18 0625  04/07/18 0450 04/08/18 0415 04/09/18 0355 04/09/18 2139 04/10/18 0351 04/11/18 0920  NA 136   < > 136   < > 138 141 141 139 141 143  K 3.7   < > 3.4*   < > 3.9 4.0 4.1 4.6 4.5 4.6  CL 102   < > 104   < > 108 112* 112* 111 113* 115*  CO2 26  --  25   < > 21* 21* 22 21* 21* 20*  GLUCOSE 102*   < > 106*   < > 84  102* 127* 165* 128* 86  BUN 33*   < > 28*   < > 33* 35* 40* 44* 47* 44*  CREATININE 1.43*   < > 1.38*   < > 1.65* 1.18* 0.99 1.13* 1.11* 0.96  CALCIUM 8.7*  --  7.8*   < > 7.2* 7.3* 7.2* 7.3* 7.4* 7.7*  MG  --   --   --   --  1.3* 1.9  --  1.8  --   --   AST 75*  --  58*  --   --  46* 54*  --  41  --   ALT 43  --  38  --   --  33 36  --  30  --   ALKPHOS 59  --  45  --   --  48 55  --  51  --   BILITOT 0.7  --  0.6  --   --  0.6 0.5  --  0.7  --    < > = values in this interval not displayed.    ------------------------------------------------------------------------------------------------------------------ No results for input(s): CHOL, HDL, LDLCALC, TRIG, CHOLHDL, LDLDIRECT in the last 72 hours.  Lab Results  Component Value Date   HGBA1C 5.9 (H) 04/07/2018   ------------------------------------------------------------------------------------------------------------------ No results for input(s): TSH, T4TOTAL, T3FREE, THYROIDAB in the last 72 hours.  Invalid input(s): FREET3 ------------------------------------------------------------------------------------------------------------------ No results for input(s): VITAMINB12, FOLATE, FERRITIN, TIBC, IRON, RETICCTPCT in the last 72 hours.  Coagulation profile Recent Labs  Lab 04/06/18 1952  INR 1.22    No results for input(s): DDIMER in the last 72 hours.  Cardiac Enzymes Recent Labs  Lab 04/09/18 2139 04/10/18 0351 04/10/18 0945  TROPONINI 0.07* 0.08* 0.06*   ------------------------------------------------------------------------------------------------------------------    Component Value Date/Time   BNP 34.0 06/14/2014 1415    Alyn Riedinger M.D on 04/11/2018 at 5:49 PM  Pager---[phone] Go to [email or web address] - [email or web address] for contact info  [facility] - Office  [phone]

## 2018-04-11 NOTE — Care Management Important Message (Signed)
Important Message  Patient Details  Name: [name] MRN: [id number] Date of Birth: [birth year]   Medicare Important Message Given:  Yes    Shelda Altes 04/11/2018, 2:47 PM

## 2018-04-11 NOTE — Progress Notes (Signed)
  Subjective: Patient anxious, but denies any abdominal pain.  She is passing flatus.  She does not like the taste of the clear liquids.  No nausea or vomiting have been noted.  Objective: Vital signs in last 24 hours: Temp:  [97.5 F (36.4 C)-98.8 F (37.1 C)] 98.4 F (36.9 C) (11/01 0812) Pulse Rate:  [82-109] 90 (11/01 0812) Resp:  [12-18] 17 (11/01 0812) BP: (88-146)/(56-85) 127/83 (11/01 0700) SpO2:  [91 %-99 %] 98 % (11/01 0812) Weight:  [127.4 kg] 127.4 kg (11/01 0600) Last BM Date: 04/08/18  Intake/Output from previous day: 10/31 0701 - 11/01 0700 In: 4044.3 [P.O.:480; I.V.:3164.2; IV Piggyback:400.1] Out: -  Intake/Output this shift: No intake/output data recorded.  General appearance: alert and cooperative GI: soft, non-tender; bowel sounds normal; no masses,  no organomegaly  Lab Results:  Recent Labs    04/09/18 2139 04/10/18 0351  WBC 15.1* 14.4*  HGB 11.1* 10.9*  HCT 34.2* 33.0*  PLT 322 314   BMET Recent Labs    04/09/18 2139 04/10/18 0351  NA 139 141  K 4.6 4.5  CL 111 113*  CO2 21* 21*  GLUCOSE 165* 128*  BUN 44* 47*  CREATININE 1.13* 1.11*  CALCIUM 7.3* 7.4*   PT/INR No results for input(s): LABPROT, INR in the last 72 hours.  Studies/Results: No results found.  Anti-infectives: Anti-infectives (From admission, onward)   Start     Dose/Rate Route Frequency Ordered Stop   04/09/18 2200  meropenem (MERREM) 1 g in sodium chloride 0.9 % 100 mL IVPB     1 g 200 mL/hr over 30 Minutes Intravenous Every 8 hours 04/09/18 1244     04/06/18 1300  meropenem (MERREM) 1 g in sodium chloride 0.9 % 100 mL IVPB  Status:  Discontinued     1 g 200 mL/hr over 30 Minutes Intravenous Every 12 hours 04/06/18 1145 04/09/18 1244   04/06/18 1145  meropenem (MERREM) 1 g in sodium chloride 0.9 % 100 mL IVPB  Status:  Discontinued     1 g 200 mL/hr over 30 Minutes Intravenous Every 8 hours 04/06/18 1139 04/06/18 1145   04/06/18 0900  ciprofloxacin (CIPRO)  IVPB 400 mg  Status:  Discontinued     400 mg 200 mL/hr over 60 Minutes Intravenous Every 12 hours 04/06/18 0732 04/06/18 1139   04/06/18 0300  metroNIDAZOLE (FLAGYL) IVPB 500 mg  Status:  Discontinued     500 mg 100 mL/hr over 60 Minutes Intravenous Every 8 hours 04/05/18 2335 04/06/18 1139   04/05/18 1930  ciprofloxacin (CIPRO) IVPB 400 mg     400 mg 200 mL/hr over 60 Minutes Intravenous  Once 04/05/18 1925 04/05/18 2217   04/05/18 1930  metroNIDAZOLE (FLAGYL) IVPB 500 mg     500 mg 100 mL/hr over 60 Minutes Intravenous  Once 04/05/18 1925 04/05/18 2102      Assessment/Plan: Impression: Perforated sigmoid diverticulitis.  Resolving nicely.  Leukocytosis slowly resolving.  No peritoneal signs. Plan: Continue current IV medications.  Will advance to full liquid diet.  LOS: 4 days    Aviva Signs 04/11/2018

## 2018-04-12 LAB — GLUCOSE, CAPILLARY
GLUCOSE-CAPILLARY: 101 mg/dL — AB (ref 70–99)
GLUCOSE-CAPILLARY: 117 mg/dL — AB (ref 70–99)
GLUCOSE-CAPILLARY: 120 mg/dL — AB (ref 70–99)
GLUCOSE-CAPILLARY: 109 mg/dL — AB (ref 70–99)
Glucose-Capillary: 102 mg/dL — ABNORMAL HIGH (ref 70–99)
Glucose-Capillary: 93 mg/dL (ref 70–99)
Glucose-Capillary: 99 mg/dL (ref 70–99)

## 2018-04-12 NOTE — Progress Notes (Signed)
Subjective: Patient feels weak, but denies abdominal pain.  She is passing flatus and moving her bowels.  Objective: Vital signs in last 24 hours: Temp:  [97.4 F (36.3 C)-98.5 F (36.9 C)] 98.2 F (36.8 C) (11/02 0800) Pulse Rate:  [87-107] 87 (11/02 0900) Resp:  [10-22] 13 (11/02 0900) BP: (93-159)/(57-148) 133/77 (11/02 0900) SpO2:  [94 %-100 %] 97 % (11/02 0900) Weight:  [130.3 kg] 130.3 kg (11/02 0458) Last BM Date: 04/08/18  Intake/Output from previous day: 11/01 0701 - 11/02 0700 In: 1475.4 [I.V.:1175.4; IV Piggyback:300] Out: 700 [Urine:700] Intake/Output this shift: No intake/output data recorded.  General appearance: alert, cooperative, fatigued and no distress GI: soft, non-tender; bowel sounds normal; no masses,  no organomegaly  Lab Results:  Recent Labs    04/10/18 0351 04/11/18 0920  WBC 14.4* 11.1*  HGB 10.9* 11.3*  HCT 33.0* 36.6  PLT 314 273   BMET Recent Labs    04/10/18 0351 04/11/18 0920  NA 141 143  K 4.5 4.6  CL 113* 115*  CO2 21* 20*  GLUCOSE 128* 86  BUN 47* 44*  CREATININE 1.11* 0.96  CALCIUM 7.4* 7.7*   PT/INR No results for input(s): LABPROT, INR in the last 72 hours.  Studies/Results: Dg Chest Port 1 View  Result Date: 04/11/2018 CLINICAL DATA:  PICC placement. EXAM: PORTABLE CHEST 1 VIEW COMPARISON:  Chest radiograph 04/06/2018. FINDINGS: Cardiomegaly.  Mild vascular congestion.  Prior CABG. RIGHT arm PICC line tip appears to lie distal SVC. This is marked with an arrow. No pneumothorax. IMPRESSION: RIGHT arm PICC line tip appears to lie distal SVC. No pneumothorax. Cardiomegaly with mild vascular congestion, stable from priors. Electronically Signed   By: Staci Righter M.D.   On: 04/11/2018 09:34    Anti-infectives: Anti-infectives (From admission, onward)   Start     Dose/Rate Route Frequency Ordered Stop   04/09/18 2200  meropenem (MERREM) 1 g in sodium chloride 0.9 % 100 mL IVPB     1 g 200 mL/hr over 30 Minutes  Intravenous Every 8 hours 04/09/18 1244     04/06/18 1300  meropenem (MERREM) 1 g in sodium chloride 0.9 % 100 mL IVPB  Status:  Discontinued     1 g 200 mL/hr over 30 Minutes Intravenous Every 12 hours 04/06/18 1145 04/09/18 1244   04/06/18 1145  meropenem (MERREM) 1 g in sodium chloride 0.9 % 100 mL IVPB  Status:  Discontinued     1 g 200 mL/hr over 30 Minutes Intravenous Every 8 hours 04/06/18 1139 04/06/18 1145   04/06/18 0900  ciprofloxacin (CIPRO) IVPB 400 mg  Status:  Discontinued     400 mg 200 mL/hr over 60 Minutes Intravenous Every 12 hours 04/06/18 0732 04/06/18 1139   04/06/18 0300  metroNIDAZOLE (FLAGYL) IVPB 500 mg  Status:  Discontinued     500 mg 100 mL/hr over 60 Minutes Intravenous Every 8 hours 04/05/18 2335 04/06/18 1139   04/05/18 1930  ciprofloxacin (CIPRO) IVPB 400 mg     400 mg 200 mL/hr over 60 Minutes Intravenous  Once 04/05/18 1925 04/05/18 2217   04/05/18 1930  metroNIDAZOLE (FLAGYL) IVPB 500 mg     500 mg 100 mL/hr over 60 Minutes Intravenous  Once 04/05/18 1925 04/05/18 2102      Assessment/Plan: Impression: Perforated sigmoid diverticulitis, resolving nicely.  Bowel function returning. Plan: No need for surgical intervention at this time.  May start ambulating patient as able.  LOS: 5 days    Aviva Signs 04/12/2018

## 2018-04-12 NOTE — Progress Notes (Signed)
Patient Demographics:    [name], is a 72 y.o. female, DOB - 27-Nov-1945, XJO:[id number]  Admit date - 04/05/2018   Admitting Physician [name], MD  Outpatient Primary MD for the patient is [name], MD  LOS - 5   Chief Complaint  Patient presents with  . Abdominal Pain        Subjective:    [name] today has no fevers, no emesis,  No chest pain, passing gas, had BM, abd pain is improving  Assessment  & Plan :    Principal Problem:   Diverticulitis Active Problems:   DM (diabetes mellitus) (HCC)   HTN (hypertension)   Rheumatoid arthritis ([place])   Anemia   Renal insufficiency   Sepsis due to undetermined organism (HCC)   CKD (chronic kidney disease) stage 3, GFR 30-59 ml/min (HCC)   Fibromyalgia   Diverticulitis large intestine w/o perforation or abscess w/o bleeding   Acute diverticulitis   Perforated sigmoid colon (HCC)   Acute renal failure superimposed on stage 3 chronic kidney disease (HCC)  Brief Summary- 72 year old with past medical history relevant for CAD, prior stroke/TIA, dyslipidemia morbid obesity DM2, hypertension rheumatoid arthritis fibromyalgia and chronic pain syndrome admitted 04/05/2018 with acute sigmoid diverticulitis with perforation   Plan:- 1)Acute perforated sigmoid diverticulitis--- abdominal pain is slowly improving, tolerating  liquid diet, advance diet further as per general surgeon , passing gas and having BM,  white count is down to 11.1 from 15.9, procalcitonin is down to 1.75 from 3.23,  continue IV meropenem started on 04/06/2018,  no plans for surgical intervention at this time per general surgeon  2)AKI----acute kidney injury on CKD stage - III    creatinine on admission= 1.43 ,   baseline creatinine = 1.2 to 1.3    , creatinine is now= 0.9 (peaked at 1.81)    , renally adjust medications, avoid nephrotoxic  agents/dehydration/hypotension  3)FEN----tolerated liquid diet, advance diet further as per general surgeon ,decrease normal saline to KVO  4)HTN--okay to continue to Hold isosorbide, metoprolol, also Hold olmesartan/amlodipine/HCTZ combo ,   may use IV Hydralazine 10 mg  Every 4 hours Prn for systolic blood pressure over 160 mmhg  5)DM2- .  Excellent control with A1c of 5.9,  , Use Novolog/Humalog Sliding scale insulin with Accu-Cheks/Fingersticks as ordered    6)H/o CAD/TIA--- previously on Plavix and pravastatin, restart Plavix as no surgical procedures planned  7)Morbid obesity--BMI over 44 , this complicates overall care  8) sepsis--- patient met sepsis criteria on admission due to #1 above,  9) acute on chronic anemia--- suspect some degree of anemia chronic disease in patient with rheumatoid arthritis, H&H improved and currently stable at 11.3 after transfusion of 2 units of packed red blood cells on 04/07/2018  Disposition/Need for in-Hospital Stay- patient unable to be discharged at this time due to need for IV antibiotics for perforated diverticulitis, need for IV fluids due to poor oral intake in the setting of perforated viscus  Code Status : full   Disposition Plan  : TBD  Consults  :  Gen surgery   DVT Prophylaxis  :   SCDs  Lab Results  Component Value Date   PLT 273 04/11/2018    Inpatient Medications  Scheduled  Meds: . albuterol  2.5 mg Nebulization Once  . Chlorhexidine Gluconate Cloth  6 each Topical Daily  . feeding supplement (ENSURE ENLIVE)  237 mL Oral TID BM  . latanoprost  1 drop Both Eyes QHS  . metoprolol tartrate  12.5 mg Oral BID  . pantoprazole  40 mg Oral Daily  . pravastatin  40 mg Oral QHS  . senna-docusate  2 tablet Oral BID  . sodium chloride flush  10-40 mL Intracatheter Q12H  . temazepam  30 mg Oral QHS  . timolol  1 drop Both Eyes BID   Continuous Infusions: . 0.9 % NaCl with KCl 20 mEq / L 40 mL/hr at 04/12/18 1657  . meropenem  (MERREM) IV Stopped (04/12/18 1337)   PRN Meds:.acetaminophen **OR** acetaminophen, hydrALAZINE, HYDROmorphone (DILAUDID) injection, LORazepam, ondansetron (ZOFRAN) IV, sodium chloride flush    Anti-infectives (From admission, onward)   Start     Dose/Rate Route Frequency Ordered Stop   04/09/18 2200  meropenem (MERREM) 1 g in sodium chloride 0.9 % 100 mL IVPB     1 g 200 mL/hr over 30 Minutes Intravenous Every 8 hours 04/09/18 1244     04/06/18 1300  meropenem (MERREM) 1 g in sodium chloride 0.9 % 100 mL IVPB  Status:  Discontinued     1 g 200 mL/hr over 30 Minutes Intravenous Every 12 hours 04/06/18 1145 04/09/18 1244   04/06/18 1145  meropenem (MERREM) 1 g in sodium chloride 0.9 % 100 mL IVPB  Status:  Discontinued     1 g 200 mL/hr over 30 Minutes Intravenous Every 8 hours 04/06/18 1139 04/06/18 1145   04/06/18 0900  ciprofloxacin (CIPRO) IVPB 400 mg  Status:  Discontinued     400 mg 200 mL/hr over 60 Minutes Intravenous Every 12 hours 04/06/18 0732 04/06/18 1139   04/06/18 0300  metroNIDAZOLE (FLAGYL) IVPB 500 mg  Status:  Discontinued     500 mg 100 mL/hr over 60 Minutes Intravenous Every 8 hours 04/05/18 2335 04/06/18 1139   04/05/18 1930  ciprofloxacin (CIPRO) IVPB 400 mg     400 mg 200 mL/hr over 60 Minutes Intravenous  Once 04/05/18 1925 04/05/18 2217   04/05/18 1930  metroNIDAZOLE (FLAGYL) IVPB 500 mg     500 mg 100 mL/hr over 60 Minutes Intravenous  Once 04/05/18 1925 04/05/18 2102        Objective:   Vitals:   04/12/18 1000 04/12/18 1200 04/12/18 1500 04/12/18 1600  BP: 133/88     Pulse: 94  94 (!) 102  Resp: 15  17 17   Temp:  97.9 F (36.6 C)    TempSrc:  Oral    SpO2: 94%     Weight:        Wt Readings from Last 3 Encounters:  04/12/18 130.3 kg  02/13/18 116.1 kg  01/13/18 120.7 kg     Intake/Output Summary (Last 24 hours) at 04/12/2018 1803 Last data filed at 04/12/2018 1344 Gross per 24 hour  Intake 2413.67 ml  Output 700 ml  Net 1713.67 ml      Physical Exam Patient is examined daily including today on 04/12/18 , exams remain the same as of yesterday except that has changed   Gen:- Awake Alert, morbidly obese, no acute distress HEENT:- [place].AT, No sclera icterus Neck-Supple Neck,No JVD,.  Lungs-  CTAB , fairly symmetrical air movement CV- S1, S2 normal Abd-  +ve B.Sounds, Abd Soft, generalized abdominal tenderness worse in the left lower quadrant, no significant distention, no  acute abdomen likely Extremity/Skin:- No  edema,   good pulses, Rt arm PICC line site is clean dry and intact Psych-affect is appropriate, oriented x3 Neuro-no new focal deficits, no tremors   Data Review:   Micro Results Recent Results (from the past 240 hour(s))  Blood Culture (routine x 2)     Status: None   Collection Time: 04/05/18  7:48 PM  Result Value Ref Range Status   Specimen Description BLOOD LEFT FOREARM  Final   Special Requests   Final    BOTTLES DRAWN AEROBIC AND ANAEROBIC Blood Culture adequate volume   Culture   Final    NO GROWTH 5 DAYS Performed at [facility], [address] [place], [place] [address]    Report Status 04/10/2018 FINAL  Final  Blood Culture (routine x 2)     Status: None   Collection Time: 04/05/18  7:57 PM  Result Value Ref Range Status   Specimen Description RIGHT ANTECUBITAL  Final   Special Requests   Final    BOTTLES DRAWN AEROBIC ONLY Blood Culture adequate volume   Culture   Final    NO GROWTH 5 DAYS Performed at [facility], [address], [place], [place] [address]    Report Status 04/10/2018 FINAL  Final  MRSA PCR Screening     Status: None   Collection Time: 04/07/18  9:08 PM  Result Value Ref Range Status   MRSA by PCR NEGATIVE NEGATIVE Final    Comment:        The GeneXpert MRSA Assay (FDA approved for NASAL specimens only), is one component of a comprehensive MRSA colonization surveillance program. It is not intended to diagnose MRSA infection nor to guide or monitor treatment  for MRSA infections. Performed at [facility], [address] [place], [place] [address]     Radiology Reports Ct Abdomen Pelvis Wo Contrast  Result Date: 04/06/2018 CLINICAL DATA:  72 year old female with concern for perforated diverticulitis. EXAM: CT ABDOMEN AND PELVIS WITHOUT CONTRAST TECHNIQUE: Multidetector CT imaging of the abdomen and pelvis was performed following the standard protocol without IV contrast. COMPARISON:  Chest radiograph dated 04/06/2018 and CT dated 04/05/2018 FINDINGS: Evaluation of this exam is limited in the absence of intravenous contrast. Lower chest: Diffuse interstitial coarsening and streaky atelectasis/scarring of the lung bases. There is mild cardiomegaly. There is hypoattenuation of the cardiac blood pool suggestive of a degree of anemia. Clinical correlation is recommended. There is pneumoperitoneum and small amount of free fluid within the pelvis. Hepatobiliary: Advanced fatty infiltration of the liver. No intrahepatic biliary ductal dilatation. Cholecystectomy. Pancreas: Unremarkable. No pancreatic ductal dilatation or surrounding inflammatory changes. Spleen: Normal in size without focal abnormality. Adrenals/Urinary Tract: The adrenal glands are unremarkable. There is no hydronephrosis on either side. Excreted contrast in the renal collecting systems bilaterally. The visualized ureters appear unremarkable. There is trabeculated appearance of the bladder wall likely related to chronic bladder dysfunction. Correlation with urinalysis recommended to exclude cystitis. Stomach/Bowel: There is extensive sigmoid diverticulosis. There is associated inflammatory changes and perforation of the sigmoid colon. There is increased size of the extraluminal air since the prior CT. No drainable fluid collection is noted at this time. There is no bowel obstruction. Normal appendix. Vascular/Lymphatic: There is advanced aortoiliac atherosclerotic disease. No portal venous gas. There  is no adenopathy. Reproductive: The uterus is grossly unremarkable. Other: None Musculoskeletal: Degenerative changes of the spine. No acute osseous pathology. IMPRESSION: 1. Perforated sigmoid diverticulitis with increased size of the extraluminal air since  the prior CT. No drainable fluid collection noted at this time. 2. No bowel obstruction. Normal appendix. 3. Advanced fatty infiltration of the liver. These results were called by telephone at the time of interpretation on 04/06/2018 at 11:09 pm to nurse Hearn, who verbally acknowledged these results. Electronically Signed   By: Anner Crete M.D.   On: 04/06/2018 23:25   Dg Chest 2 View  Result Date: 04/06/2018 CLINICAL DATA:  Dyspnea, sepsis, pt listless and unable to cooperate EXAM: CHEST - 2 VIEW COMPARISON:  06/27/2016 and 06/18/2016. FINDINGS: On the lateral view, there is evidence of a small amount free intraperitoneal air under the left hemidiaphragm. Lung volumes are low. There is opacity at the bases that is likely due to atelectasis along with bronchovascular crowding. No convincing pneumonia and no evidence of pulmonary edema. 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 Cardiac silhouette is normal in size. No mediastinal hilar masses. No pleural effusion.  No pneumothorax. Skeletal structures are grossly intact. IMPRESSION: 1. Small amount of free intraperitoneal air. Recommend follow-up abdomen and pelvis CT with contrast for further assessment. 2. Somewhat limited study due to low lung volumes. Allowing for this, no acute cardiopulmonary disease. Electronically Signed   By: Lajean Manes M.D.   On: 04/06/2018 11:23   Ct Abdomen Pelvis W Contrast  Result Date: 04/05/2018 CLINICAL DATA:  Patient has bilateral lower abdominal sharp intermittent pain with nausea without vomiting. H/x diverticulitis patient states pain and symptoms similar to past. EXAM: CT ABDOMEN AND PELVIS WITH CONTRAST TECHNIQUE: Multidetector CT imaging of the abdomen and  pelvis was performed using the standard protocol following bolus administration of intravenous contrast. CONTRAST:  67m ISOVUE-300 IOPAMIDOL (ISOVUE-300) INJECTION 61% COMPARISON:  02/06/2018 FINDINGS: Lower chest: There is scarring in both lung bases. Coronary artery calcifications are present. The heart is normal in size. Hepatobiliary: Liver is diffusely low attenuation. No focal liver lesions. Cholecystectomy. Pancreas: Unremarkable. No pancreatic ductal dilatation or surrounding inflammatory changes. Spleen: Normal in size without focal abnormality. Adrenals/Urinary Tract: Adrenal glands are normal. There is symmetric enhancement and excretion from the kidneys. 1 millimeter intrarenal calcification identified in the LOWER pole of the RIGHT kidney, not associated with obstruction. Ureters are unremarkable. The bladder and visualized portion of the urethra are normal. Stomach/Bowel: The stomach is normal in appearance. Small bowel loops are normal in caliber and wall thickness. The appendix is well seen and has a normal appearance. There is significant inflammatory change in the region of sigmoid colon and associated numerous diverticula in this segment. Fluid extends in the sigmoid mesentery. The diseased segment of sigmoid is adjacent to the uterus and the urinary bladder. No evidence for abscess or perforation. Vascular/Lymphatic: There is atherosclerotic calcification of the abdominal aorta. There is normal vascular opacification of the celiac axis, superior mesenteric artery, and inferior mesenteric artery. Normal appearance of the portal venous system and inferior vena cava. Reproductive: The uterus is present and is surrounded by inflammatory changes from the sigmoid diverticulitis. No adnexal mass. Other: Trace amount of free pelvic fluid. Musculoskeletal: Incompletely imaged fluid attenuation collection within the LEFT hip abductors, measuring at least 7.3 x 4.8 centimeters. The appearance is stable  since 2017. There are degenerative changes in the lumbar spine with 7 millimeters anterolisthesis of L4 on L5. Median sternotomy. IMPRESSION: 1. Acute sigmoid diverticulitis with significant fluid in the sigmoid mesentery. There is no evidence for perforation or abscess. 2. Coronary artery calcifications. 3. Hepatic steatosis.  Cholecystectomy. 4. Nonobstructing intrarenal calculus in the LOWER pole the  RIGHT kidney. 5. Normal appendix. 6.  Aortic atherosclerosis. 7. Probably benign ganglion cyst within the LEFT hip abductors. Electronically Signed   By: Nolon Nations M.D.   On: 04/05/2018 22:06   US Arterial Abi (screening Lower Extremity)  Result Date: 03/19/2018 CLINICAL DATA:  72 year old female with chronic bilateral lower extremity pain EXAM: NONINVASIVE PHYSIOLOGIC VASCULAR STUDY OF BILATERAL LOWER EXTREMITIES TECHNIQUE: Evaluation of both lower extremities were performed at rest, including calculation of ankle-brachial indices with single level Doppler, pressure and pulse volume recording. COMPARISON:  None. FINDINGS: Right ABI:  1.2 Left ABI:  1.1 Right Lower Extremity:  Normal arterial waveforms at the ankle. Left Lower Extremity:  Normal arterial waveforms at the ankle. 1.0-1.4 Normal IMPRESSION: Normal examination. No evidence of hemodynamically significant peripheral arterial disease. Signed, Criselda Peaches, MD, [facility] Radiology Electronically Signed   By: Jacqulynn Cadet M.D.   On: 03/19/2018 16:28   Dg Chest Port 1 View  Result Date: 04/11/2018 CLINICAL DATA:  PICC placement. EXAM: PORTABLE CHEST 1 VIEW COMPARISON:  Chest radiograph 04/06/2018. FINDINGS: Cardiomegaly.  Mild vascular congestion.  Prior CABG. RIGHT arm PICC line tip appears to lie distal SVC. This is marked with an arrow. No pneumothorax. IMPRESSION: RIGHT arm PICC line tip appears to lie distal SVC. No pneumothorax. Cardiomegaly with mild vascular congestion,  stable from priors. Electronically Signed   By: Staci Righter M.D.   On: 04/11/2018 09:34     CBC Recent Labs  Lab 04/05/18 1948  04/08/18 0415 04/09/18 0355 04/09/18 2139 04/10/18 0351 04/11/18 0920  WBC 13.8*   < > 15.9* 13.2* 15.1* 14.4* 11.1*  HGB 10.0*   < > 10.4* 11.0* 11.1* 10.9* 11.3*  HCT 31.9*   < > 32.4* 33.7* 34.2* 33.0* 36.6  PLT 353   < > 266 313 322 314 273  MCV 97.6   < > 93.6 91.1 91.2 92.2 94.8  MCH 30.6   < > 30.1 29.7 29.6 30.4 29.3  MCHC 31.3   < > 32.1 32.6 32.5 33.0 30.9  RDW 16.8*   < > 17.1* 17.2* 17.4* 17.2* 18.2*  LYMPHSABS 0.5*  --   --   --   --   --   --   MONOABS 0.5  --   --   --   --   --   --   EOSABS 0.1  --   --   --   --   --   --   BASOSABS 0.1  --   --   --   --   --   --    < > = values in this interval not displayed.    Chemistries  Recent Labs  Lab 04/05/18 1948  04/06/18 0625  04/07/18 0450 04/08/18 0415 04/09/18 0355 04/09/18 2139 04/10/18 0351 04/11/18 0920  NA 136   < > 136   < > 138 141 141 139 141 143  K 3.7   < > 3.4*   < > 3.9 4.0 4.1 4.6 4.5 4.6  CL 102   < > 104   < > 108 112* 112* 111 113* 115*  CO2 26  --  25   < > 21* 21* 22 21* 21* 20*  GLUCOSE 102*   < > 106*   < > 84 102* 127* 165* 128* 86  BUN 33*   < > 28*   < > 33* 35* 40* 44* 47* 44*  CREATININE 1.43*   < >  1.38*   < > 1.65* 1.18* 0.99 1.13* 1.11* 0.96  CALCIUM 8.7*  --  7.8*   < > 7.2* 7.3* 7.2* 7.3* 7.4* 7.7*  MG  --   --   --   --  1.3* 1.9  --  1.8  --   --   AST 75*  --  58*  --   --  46* 54*  --  41  --   ALT 43  --  38  --   --  33 36  --  30  --   ALKPHOS 59  --  45  --   --  48 55  --  51  --   BILITOT 0.7  --  0.6  --   --  0.6 0.5  --  0.7  --    < > = values in this interval not displayed.   ------------------------------------------------------------------------------------------------------------------ No results for input(s): CHOL, HDL, LDLCALC, TRIG, CHOLHDL, LDLDIRECT in the last 72 hours.  Lab Results  Component Value Date    HGBA1C 5.9 (H) 04/07/2018   ------------------------------------------------------------------------------------------------------------------ No results for input(s): TSH, T4TOTAL, T3FREE, THYROIDAB in the last 72 hours.  Invalid input(s): FREET3 ------------------------------------------------------------------------------------------------------------------ No results for input(s): VITAMINB12, FOLATE, FERRITIN, TIBC, IRON, RETICCTPCT in the last 72 hours.  Coagulation profile Recent Labs  Lab 04/06/18 1952  INR 1.22    No results for input(s): DDIMER in the last 72 hours.  Cardiac Enzymes Recent Labs  Lab 04/09/18 2139 04/10/18 0351 04/10/18 0945  TROPONINI 0.07* 0.08* 0.06*   ------------------------------------------------------------------------------------------------------------------    Component Value Date/Time   BNP 34.0 06/14/2014 1415    Aquiles Ruffini M.D on 04/12/2018 at 6:03 PM  Pager---[phone] Go to [email or web address] - password [email or web address] for contact info  [facility] - Office  [phone]

## 2018-04-13 LAB — BASIC METABOLIC PANEL
Anion gap: 5 (ref 5–15)
BUN: 35 mg/dL — ABNORMAL HIGH (ref 8–23)
CALCIUM: 8.5 mg/dL — AB (ref 8.9–10.3)
CHLORIDE: 112 mmol/L — AB (ref 98–111)
CO2: 24 mmol/L (ref 22–32)
CREATININE: 0.77 mg/dL (ref 0.44–1.00)
GFR calc Af Amer: >60 mL/min (ref >60)
Glucose, Bld: 109 mg/dL — ABNORMAL HIGH (ref 70–99)
POTASSIUM: 4.5 mmol/L (ref 3.5–5.1)
Sodium: 141 mmol/L (ref 135–145)

## 2018-04-13 LAB — GLUCOSE, CAPILLARY
GLUCOSE-CAPILLARY: 90 mg/dL (ref 70–99)
GLUCOSE-CAPILLARY: 97 mg/dL (ref 70–99)
Glucose-Capillary: 110 mg/dL — ABNORMAL HIGH (ref 70–99)
Glucose-Capillary: 108 mg/dL — ABNORMAL HIGH (ref 70–99)
Glucose-Capillary: 104 mg/dL — ABNORMAL HIGH (ref 70–99)

## 2018-04-13 LAB — CBC
HCT: 32.8 % — ABNORMAL LOW (ref 36.0–46.0)
Hemoglobin: 10.5 g/dL — ABNORMAL LOW (ref 12.0–15.0)
MCH: 30.3 pg (ref 26.0–34.0)
MCHC: 32 g/dL (ref 30.0–36.0)
MCV: 94.5 fL (ref 80.0–100.0)
PLATELETS: 290 10*3/uL (ref 150–400)
RBC: 3.47 MIL/uL — AB (ref 3.87–5.11)
RDW: 17.4 % — ABNORMAL HIGH (ref 11.5–15.5)
WBC: 11.7 10*3/uL — ABNORMAL HIGH (ref 4.0–10.5)
nRBC: 0 % (ref 0.0–0.2)

## 2018-04-13 MED ORDER — METOPROLOL TARTRATE 5 MG/5ML IV SOLN
5.0000 mg | Freq: Once | INTRAVENOUS | Status: AC
  Administered 2018-04-13: 5 mg via INTRAVENOUS
  Filled 2018-04-13: qty 5

## 2018-04-13 MED ORDER — METOPROLOL TARTRATE 25 MG PO TABS
25.0000 mg | ORAL_TABLET | Freq: Two times a day (BID) | ORAL | Status: DC
  Administered 2018-04-13 – 2018-04-22 (×15): 25 mg via ORAL
  Filled 2018-04-13 (×17): qty 1

## 2018-04-13 NOTE — Progress Notes (Signed)
Pt refusing to eat for family and nursing staff. Pt currently in no pain, just states she doesn't have an appetite. Will continue to monitor.

## 2018-04-13 NOTE — Progress Notes (Signed)
Patient's heart rate elevated and sustaining in the 130's. Vital signs obtained. Denies chest pain at this time. Mid Level made aware. Will continue to monitor.

## 2018-04-13 NOTE — Progress Notes (Signed)
Patient Demographics:    [name], is a 72 y.o. female, DOB - July 03, 1945, DUK:[id number]  Admit date - 04/05/2018   Admitting Physician [name], MD  Outpatient Primary MD for the patient is [name], MD  LOS - 6   Chief Complaint  Patient presents with  . Abdominal Pain        Subjective:    [name] today has no fevers, no emesis,  No chest pain, passing gas, had BM, abd pain is improving  Assessment  & Plan :    Principal Problem:   Diverticulitis Active Problems:   DM (diabetes mellitus) (HCC)   HTN (hypertension)   Rheumatoid arthritis ([place])   Anemia   Renal insufficiency   Sepsis due to undetermined organism ([place])   CKD (chronic kidney disease) stage 3, GFR 30-59 ml/min (HCC)   Fibromyalgia   Diverticulitis large intestine w/o perforation or abscess w/o bleeding   Acute diverticulitis   Perforated sigmoid colon (HCC)   Acute renal failure superimposed on stage 3 chronic kidney disease (HCC)  Brief Summary- 72 year old with past medical history relevant for CAD, prior stroke/TIA, dyslipidemia morbid obesity DM2, hypertension rheumatoid arthritis fibromyalgia and chronic pain syndrome admitted 04/05/2018 with acute sigmoid diverticulitis with perforation--Dr. Arnoldo Morale the general surgeon is helping to manage   Plan:- 1)Acute perforated sigmoid diverticulitis--- T-max 100.4,  tolerating  liquid diet, abdomen is less tender, advance diet further as per general surgeon , passing gas and had small BM on 04/2018,  white count is down to 11.7 (peak was 15.9), procalcitonin  trended down (peaked at 3.23),  continue IV meropenem started on 04/06/2018,  no plans for surgical intervention at this time per general surgeon  2)AKI----acute kidney injury on CKD stage - III  , overall AKI is resolved , creatinine on admission= 1.43 ,   baseline creatinine = 1.2 to 1.3   , creatinine is  now= 0.7 (peaked at 1.81) , renally adjust medications, avoid nephrotoxic agents / dehydration /hypotension  3)FEN----Tolerated liquid diet, advance diet further as per general surgeon , continue IV fluid at 20 mL an hour  4)HTN--stable, continue Metoprolol 25 mg daily, but continue to  Hold isosorbide,  also Hold olmesartan/amlodipine/HCTZ combo ,   may use IV Hydralazine 10 mg  Every 4 hours Prn for systolic blood pressure over 160 mmhg  5)DM2-   Excellent control with A1c of 5.9, avoid over aggressive diabetic/glycemic control due to poor oral intake with risk of hypoglycemia, Use Novolog/Humalog Sliding scale insulin with Accu-Cheks/Fingersticks as ordered   6)H/o CAD/TIA--- previously on Plavix and pravastatin, c/n Plavix as no surgical procedures planned  7)Morbid obesity--BMI over 44 , this complicates overall care  8)Sepsis--- patient met sepsis criteria on admission due to perforated sigmoid diverticulitis as in  #1 above,   9) acute on chronic anemia--- suspect some degree of anemia chronic disease in patient with rheumatoid arthritis, H&H improved and currently stable at 10.5 after transfusion of 2 units of packed red blood cells on 04/07/2018  Disposition/Need for in-Hospital Stay- patient unable to be discharged at this time due to need for IV antibiotics for perforated diverticulitis, need for IV fluids due to poor oral intake in the setting of perforated viscus  Code Status :  full  Disposition Plan  : TBD, get PT eval , may need SNF  Consults  :  Gen Surgery  DVT Prophylaxis  :   SCDs  Lab Results  Component Value Date   PLT 290 04/13/2018    Inpatient Medications  Scheduled Meds: . albuterol  2.5 mg Nebulization Once  . Chlorhexidine Gluconate Cloth  6 each Topical Daily  . feeding supplement (ENSURE ENLIVE)  237 mL Oral TID BM  . latanoprost  1 drop Both Eyes QHS  . metoprolol tartrate  25 mg Oral BID  . pantoprazole  40 mg Oral Daily  . pravastatin  40 mg  Oral QHS  . senna-docusate  2 tablet Oral BID  . sodium chloride flush  10-40 mL Intracatheter Q12H  . temazepam  30 mg Oral QHS  . timolol  1 drop Both Eyes BID   Continuous Infusions: . 0.9 % NaCl with KCl 20 mEq / L 20 mL/hr at 04/13/18 0100  . meropenem (MERREM) IV 1 g (04/13/18 0514)   PRN Meds:.acetaminophen **OR** acetaminophen, hydrALAZINE, HYDROmorphone (DILAUDID) injection, LORazepam, ondansetron (ZOFRAN) IV, sodium chloride flush    Anti-infectives (From admission, onward)   Start     Dose/Rate Route Frequency Ordered Stop   04/09/18 2200  meropenem (MERREM) 1 g in sodium chloride 0.9 % 100 mL IVPB     1 g 200 mL/hr over 30 Minutes Intravenous Every 8 hours 04/09/18 1244     04/06/18 1300  meropenem (MERREM) 1 g in sodium chloride 0.9 % 100 mL IVPB  Status:  Discontinued     1 g 200 mL/hr over 30 Minutes Intravenous Every 12 hours 04/06/18 1145 04/09/18 1244   04/06/18 1145  meropenem (MERREM) 1 g in sodium chloride 0.9 % 100 mL IVPB  Status:  Discontinued     1 g 200 mL/hr over 30 Minutes Intravenous Every 8 hours 04/06/18 1139 04/06/18 1145   04/06/18 0900  ciprofloxacin (CIPRO) IVPB 400 mg  Status:  Discontinued     400 mg 200 mL/hr over 60 Minutes Intravenous Every 12 hours 04/06/18 0732 04/06/18 1139   04/06/18 0300  metroNIDAZOLE (FLAGYL) IVPB 500 mg  Status:  Discontinued     500 mg 100 mL/hr over 60 Minutes Intravenous Every 8 hours 04/05/18 2335 04/06/18 1139   04/05/18 1930  ciprofloxacin (CIPRO) IVPB 400 mg     400 mg 200 mL/hr over 60 Minutes Intravenous  Once 04/05/18 1925 04/05/18 2217   04/05/18 1930  metroNIDAZOLE (FLAGYL) IVPB 500 mg     500 mg 100 mL/hr over 60 Minutes Intravenous  Once 04/05/18 1925 04/05/18 2102        Objective:   Vitals:   04/13/18 0600 04/13/18 0750 04/13/18 0800 04/13/18 0934  BP: (!) 145/72  (!) 146/80 (!) 141/82  Pulse: 94  91 92  Resp: 15  15   Temp:  98.2 F (36.8 C)    TempSrc:  Axillary    SpO2: 97%  97%     Weight:        Wt Readings from Last 3 Encounters:  04/13/18 128 kg  02/13/18 116.1 kg  01/13/18 120.7 kg     Intake/Output Summary (Last 24 hours) at 04/13/2018 1125 Last data filed at 04/13/2018 0600 Gross per 24 hour  Intake 2239.84 ml  Output 500 ml  Net 1739.84 ml     Physical Exam Patient is examined daily including today on 04/13/18 , exams remain the same as of yesterday except that  has changed   Gen:- Awake Alert, morbidly obese, no acute distress HEENT:- [place].AT, No sclera icterus Neck-Supple Neck,No JVD,.  Lungs-  CTAB , fairly symmetrical air movement CV- S1, S2 normal, regular Abd-  +ve B.Sounds, Abd Soft, left lower quadrant is less tender without rebound or guarding, no significant distention, no acute abdomen Extremity/Skin:- No  edema,   good pulses, Rt arm PICC line site is clean dry and intact Psych-affect is appropriate, oriented x3 Neuro-generalized weakness without new focal deficits, no tremors   Data Review:   Micro Results Recent Results (from the past 240 hour(s))  Blood Culture (routine x 2)     Status: None   Collection Time: 04/05/18  7:48 PM  Result Value Ref Range Status   Specimen Description BLOOD LEFT FOREARM  Final   Special Requests   Final    BOTTLES DRAWN AEROBIC AND ANAEROBIC Blood Culture adequate volume   Culture   Final    NO GROWTH 5 DAYS Performed at [facility], [address], [place], [place] [address]    Report Status 04/10/2018 FINAL  Final  Blood Culture (routine x 2)     Status: None   Collection Time: 04/05/18  7:57 PM  Result Value Ref Range Status   Specimen Description RIGHT ANTECUBITAL  Final   Special Requests   Final    BOTTLES DRAWN AEROBIC ONLY Blood Culture adequate volume   Culture   Final    NO GROWTH 5 DAYS Performed at [facility], [address], [place], [place] [address]    Report Status 04/10/2018 FINAL  Final  MRSA PCR Screening     Status: None   Collection Time: 04/07/18  9:08 PM  Result  Value Ref Range Status   MRSA by PCR NEGATIVE NEGATIVE Final    Comment:        The GeneXpert MRSA Assay (FDA approved for NASAL specimens only), is one component of a comprehensive MRSA colonization surveillance program. It is not intended to diagnose MRSA infection nor to guide or monitor treatment for MRSA infections. Performed at [facility], [address], [place], [place] [address]     Radiology Reports Ct Abdomen Pelvis Wo Contrast  Result Date: 04/06/2018 CLINICAL DATA:  72 year old female with concern for perforated diverticulitis. EXAM: CT ABDOMEN AND PELVIS WITHOUT CONTRAST TECHNIQUE: Multidetector CT imaging of the abdomen and pelvis was performed following the standard protocol without IV contrast. COMPARISON:  Chest radiograph dated 04/06/2018 and CT dated 04/05/2018 FINDINGS: Evaluation of this exam is limited in the absence of intravenous contrast. Lower chest: Diffuse interstitial coarsening and streaky atelectasis/scarring of the lung bases. There is mild cardiomegaly. There is hypoattenuation of the cardiac blood pool suggestive of a degree of anemia. Clinical correlation is recommended. There is pneumoperitoneum and small amount of free fluid within the pelvis. Hepatobiliary: Advanced fatty infiltration of the liver. No intrahepatic biliary ductal dilatation. Cholecystectomy. Pancreas: Unremarkable. No pancreatic ductal dilatation or surrounding inflammatory changes. Spleen: Normal in size without focal abnormality. Adrenals/Urinary Tract: The adrenal glands are unremarkable. There is no hydronephrosis on either side. Excreted contrast in the renal collecting systems bilaterally. The visualized ureters appear unremarkable. There is trabeculated appearance of the bladder wall likely related to chronic bladder dysfunction. Correlation with urinalysis recommended to exclude cystitis. Stomach/Bowel: There is extensive sigmoid diverticulosis. There is associated inflammatory  changes and perforation of the sigmoid colon. There is increased size of the extraluminal air since the prior CT. No drainable fluid collection is noted at  this time. There is no bowel obstruction. Normal appendix. Vascular/Lymphatic: There is advanced aortoiliac atherosclerotic disease. No portal venous gas. There is no adenopathy. Reproductive: The uterus is grossly unremarkable. Other: None Musculoskeletal: Degenerative changes of the spine. No acute osseous pathology. IMPRESSION: 1. Perforated sigmoid diverticulitis with increased size of the extraluminal air since the prior CT. No drainable fluid collection noted at this time. 2. No bowel obstruction. Normal appendix. 3. Advanced fatty infiltration of the liver. These results were called by telephone at the time of interpretation on 04/06/2018 at 11:09 pm to nurse Hearn, who verbally acknowledged these results. Electronically Signed   By: Anner Crete M.D.   On: 04/06/2018 23:25   Dg Chest 2 View  Result Date: 04/06/2018 CLINICAL DATA:  Dyspnea, sepsis, pt listless and unable to cooperate EXAM: CHEST - 2 VIEW COMPARISON:  06/27/2016 and 06/18/2016. FINDINGS: On the lateral view, there is evidence of a small amount free intraperitoneal air under the left hemidiaphragm. Lung volumes are low. There is opacity at the bases that is likely due to atelectasis along with bronchovascular crowding. No convincing pneumonia and no evidence of pulmonary edema. 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 Cardiac silhouette is normal in size. No mediastinal hilar masses. No pleural effusion.  No pneumothorax. Skeletal structures are grossly intact. IMPRESSION: 1. Small amount of free intraperitoneal air. Recommend follow-up abdomen and pelvis CT with contrast for further assessment. 2. Somewhat limited study due to low lung volumes. Allowing for this, no acute cardiopulmonary disease. Electronically Signed   By: Lajean Manes M.D.   On: 04/06/2018 11:23   Ct Abdomen  Pelvis W Contrast  Result Date: 04/05/2018 CLINICAL DATA:  Patient has bilateral lower abdominal sharp intermittent pain with nausea without vomiting. H/x diverticulitis patient states pain and symptoms similar to past. EXAM: CT ABDOMEN AND PELVIS WITH CONTRAST TECHNIQUE: Multidetector CT imaging of the abdomen and pelvis was performed using the standard protocol following bolus administration of intravenous contrast. CONTRAST:  25m ISOVUE-300 IOPAMIDOL (ISOVUE-300) INJECTION 61% COMPARISON:  02/06/2018 FINDINGS: Lower chest: There is scarring in both lung bases. Coronary artery calcifications are present. The heart is normal in size. Hepatobiliary: Liver is diffusely low attenuation. No focal liver lesions. Cholecystectomy. Pancreas: Unremarkable. No pancreatic ductal dilatation or surrounding inflammatory changes. Spleen: Normal in size without focal abnormality. Adrenals/Urinary Tract: Adrenal glands are normal. There is symmetric enhancement and excretion from the kidneys. 1 millimeter intrarenal calcification identified in the LOWER pole of the RIGHT kidney, not associated with obstruction. Ureters are unremarkable. The bladder and visualized portion of the urethra are normal. Stomach/Bowel: The stomach is normal in appearance. Small bowel loops are normal in caliber and wall thickness. The appendix is well seen and has a normal appearance. There is significant inflammatory change in the region of sigmoid colon and associated numerous diverticula in this segment. Fluid extends in the sigmoid mesentery. The diseased segment of sigmoid is adjacent to the uterus and the urinary bladder. No evidence for abscess or perforation. Vascular/Lymphatic: There is atherosclerotic calcification of the abdominal aorta. There is normal vascular opacification of the celiac axis, superior mesenteric artery, and inferior mesenteric artery. Normal appearance of the portal venous system and inferior vena cava. Reproductive: The  uterus is present and is surrounded by inflammatory changes from the sigmoid diverticulitis. No adnexal mass. Other: Trace amount of free pelvic fluid. Musculoskeletal: Incompletely imaged fluid attenuation collection within the LEFT hip abductors, measuring at least 7.3 x 4.8 centimeters. The appearance is stable since 2017. There  are degenerative changes in the lumbar spine with 7 millimeters anterolisthesis of L4 on L5. Median sternotomy. IMPRESSION: 1. Acute sigmoid diverticulitis with significant fluid in the sigmoid mesentery. There is no evidence for perforation or abscess. 2. Coronary artery calcifications. 3. Hepatic steatosis.  Cholecystectomy. 4. Nonobstructing intrarenal calculus in the LOWER pole the RIGHT kidney. 5. Normal appendix. 6.  Aortic atherosclerosis. 7. Probably benign ganglion cyst within the LEFT hip abductors. Electronically Signed   By: Nolon Nations M.D.   On: 04/05/2018 22:06   US Arterial Abi (screening Lower Extremity)  Result Date: 03/19/2018 CLINICAL DATA:  72 year old female with chronic bilateral lower extremity pain EXAM: NONINVASIVE PHYSIOLOGIC VASCULAR STUDY OF BILATERAL LOWER EXTREMITIES TECHNIQUE: Evaluation of both lower extremities were performed at rest, including calculation of ankle-brachial indices with single level Doppler, pressure and pulse volume recording. COMPARISON:  None. FINDINGS: Right ABI:  1.2 Left ABI:  1.1 Right Lower Extremity:  Normal arterial waveforms at the ankle. Left Lower Extremity:  Normal arterial waveforms at the ankle. 1.0-1.4 Normal IMPRESSION: Normal examination. No evidence of hemodynamically significant peripheral arterial disease. Signed, Criselda Peaches, MD, [facility] Radiology Electronically Signed   By: Jacqulynn Cadet M.D.   On: 03/19/2018 16:28   Dg Chest Port 1 View  Result Date: 04/11/2018 CLINICAL DATA:  PICC placement. EXAM: PORTABLE CHEST 1 VIEW COMPARISON:   Chest radiograph 04/06/2018. FINDINGS: Cardiomegaly.  Mild vascular congestion.  Prior CABG. RIGHT arm PICC line tip appears to lie distal SVC. This is marked with an arrow. No pneumothorax. IMPRESSION: RIGHT arm PICC line tip appears to lie distal SVC. No pneumothorax. Cardiomegaly with mild vascular congestion, stable from priors. Electronically Signed   By: Staci Righter M.D.   On: 04/11/2018 09:34     CBC Recent Labs  Lab 04/09/18 0355 04/09/18 2139 04/10/18 0351 04/11/18 0920 04/13/18 0438  WBC 13.2* 15.1* 14.4* 11.1* 11.7*  HGB 11.0* 11.1* 10.9* 11.3* 10.5*  HCT 33.7* 34.2* 33.0* 36.6 32.8*  PLT 313 322 314 273 290  MCV 91.1 91.2 92.2 94.8 94.5  MCH 29.7 29.6 30.4 29.3 30.3  MCHC 32.6 32.5 33.0 30.9 32.0  RDW 17.2* 17.4* 17.2* 18.2* 17.4*    Chemistries  Recent Labs  Lab 04/07/18 0450 04/08/18 0415 04/09/18 0355 04/09/18 2139 04/10/18 0351 04/11/18 0920 04/13/18 0438  NA 138 141 141 139 141 143 141  K 3.9 4.0 4.1 4.6 4.5 4.6 4.5  CL 108 112* 112* 111 113* 115* 112*  CO2 21* 21* 22 21* 21* 20* 24  GLUCOSE 84 102* 127* 165* 128* 86 109*  BUN 33* 35* 40* 44* 47* 44* 35*  CREATININE 1.65* 1.18* 0.99 1.13* 1.11* 0.96 0.77  CALCIUM 7.2* 7.3* 7.2* 7.3* 7.4* 7.7* 8.5*  MG 1.3* 1.9  --  1.8  --   --   --   AST  --  46* 54*  --  41  --   --   ALT  --  33 36  --  30  --   --   ALKPHOS  --  48 55  --  51  --   --   BILITOT  --  0.6 0.5  --  0.7  --   --    ------------------------------------------------------------------------------------------------------------------ No results for input(s): CHOL, HDL, LDLCALC, TRIG, CHOLHDL, LDLDIRECT in the last 72 hours.  Lab Results  Component Value Date   HGBA1C 5.9 (H) 04/07/2018   ------------------------------------------------------------------------------------------------------------------ No results for input(s): TSH, T4TOTAL,  T3FREE, THYROIDAB in the last 72 hours.  Invalid input(s):  FREET3 ------------------------------------------------------------------------------------------------------------------ No results for input(s): VITAMINB12, FOLATE, FERRITIN, TIBC, IRON, RETICCTPCT in the last 72 hours.  Coagulation profile Recent Labs  Lab 04/06/18 1952  INR 1.22    No results for input(s): DDIMER in the last 72 hours.  Cardiac Enzymes Recent Labs  Lab 04/09/18 2139 04/10/18 0351 04/10/18 0945  TROPONINI 0.07* 0.08* 0.06*   ------------------------------------------------------------------------------------------------------------------    Component Value Date/Time   BNP 34.0 06/14/2014 1415    Kaylene Dawn M.D on 04/13/2018 at 11:25 AM  Pager---[phone] Go to [email or web address] - password [email or web address] for contact info  [facility] - Office  [phone]

## 2018-04-13 NOTE — Progress Notes (Addendum)
  Subjective: Patient resting comfortably.  She is tolerating full liquid diet well.  She did have a small bowel movement yesterday.  Objective: Vital signs in last 24 hours: Temp:  [97.9 F (36.6 C)-100.4 F (38 C)] 98.2 F (36.8 C) (11/03 0750) Pulse Rate:  [80-107] 92 (11/03 0934) Resp:  [14-21] 15 (11/03 0800) BP: (110-163)/(59-105) 141/82 (11/03 0934) SpO2:  [96 %-98 %] 97 % (11/03 0800) Weight:  [703 kg] 128 kg (11/03 0500) Last BM Date: 05/01/18  Intake/Output from previous day: 11/02 0701 - 11/03 0700 In: 2613.7 [P.O.:220; I.V.:543.6; IV Piggyback:1850.1] Out: 500 [Urine:500] Intake/Output this shift: No intake/output data recorded.  General appearance: Resting GI: soft, non-tender; bowel sounds normal; no masses,  no organomegaly  Lab Results:  Recent Labs    04/11/18 0920 04/13/18 0438  WBC 11.1* 11.7*  HGB 11.3* 10.5*  HCT 36.6 32.8*  PLT 273 290   BMET Recent Labs    04/11/18 0920 04/13/18 0438  NA 143 141  K 4.6 4.5  CL 115* 112*  CO2 20* 24  GLUCOSE 86 109*  BUN 44* 35*  CREATININE 0.96 0.77  CALCIUM 7.7* 8.5*   PT/INR No results for input(s): LABPROT, INR in the last 72 hours.  Studies/Results: No results found.  Anti-infectives: Anti-infectives (From admission, onward)   Start     Dose/Rate Route Frequency Ordered Stop   04/09/18 2200  meropenem (MERREM) 1 g in sodium chloride 0.9 % 100 mL IVPB     1 g 200 mL/hr over 30 Minutes Intravenous Every 8 hours 04/09/18 1244     04/06/18 1300  meropenem (MERREM) 1 g in sodium chloride 0.9 % 100 mL IVPB  Status:  Discontinued     1 g 200 mL/hr over 30 Minutes Intravenous Every 12 hours 04/06/18 1145 04/09/18 1244   04/06/18 1145  meropenem (MERREM) 1 g in sodium chloride 0.9 % 100 mL IVPB  Status:  Discontinued     1 g 200 mL/hr over 30 Minutes Intravenous Every 8 hours 04/06/18 1139 04/06/18 1145   04/06/18 0900  ciprofloxacin (CIPRO) IVPB 400 mg  Status:  Discontinued     400 mg 200  mL/hr over 60 Minutes Intravenous Every 12 hours 04/06/18 0732 04/06/18 1139   04/06/18 0300  metroNIDAZOLE (FLAGYL) IVPB 500 mg  Status:  Discontinued     500 mg 100 mL/hr over 60 Minutes Intravenous Every 8 hours 04/05/18 2335 04/06/18 1139   04/05/18 1930  ciprofloxacin (CIPRO) IVPB 400 mg     400 mg 200 mL/hr over 60 Minutes Intravenous  Once 04/05/18 1925 04/05/18 2217   04/05/18 1930  metroNIDAZOLE (FLAGYL) IVPB 500 mg     500 mg 100 mL/hr over 60 Minutes Intravenous  Once 04/05/18 1925 04/05/18 2102      Assessment/Plan: Impression: Perforated sigmoid diverticulitis, resolving.  Bowel function starting return. Plan: Agree with transfer out of the ICU.  May advance diet once bowel function fully returns.  Continue full liquid diet.  LOS: 6 days    Aviva Signs 04/13/2018

## 2018-04-14 LAB — GLUCOSE, CAPILLARY
GLUCOSE-CAPILLARY: 94 mg/dL (ref 70–99)
Glucose-Capillary: 107 mg/dL — ABNORMAL HIGH (ref 70–99)
Glucose-Capillary: 85 mg/dL (ref 70–99)
Glucose-Capillary: 87 mg/dL (ref 70–99)
Glucose-Capillary: 89 mg/dL (ref 70–99)
Glucose-Capillary: 97 mg/dL (ref 70–99)

## 2018-04-14 MED ORDER — FUROSEMIDE 20 MG PO TABS
20.0000 mg | ORAL_TABLET | Freq: Two times a day (BID) | ORAL | Status: DC
  Administered 2018-04-14 – 2018-04-17 (×4): 20 mg via ORAL
  Filled 2018-04-14 (×5): qty 1

## 2018-04-14 MED ORDER — SENNOSIDES-DOCUSATE SODIUM 8.6-50 MG PO TABS: 2.0000 | ORAL_TABLET | Freq: Every evening | ORAL | Status: DC | PRN

## 2018-04-14 NOTE — Plan of Care (Signed)
  Problem: Acute Rehab PT Goals(only PT should resolve) Goal: Pt Will Go Supine/Side To Sit Outcome: Progressing Flowsheets (Taken 04/14/2018 1615) Pt will go Supine/Side to Sit: with moderate assist Goal: Patient Will Transfer Sit To/From Stand Outcome: Progressing Flowsheets (Taken 04/14/2018 1615) Patient will transfer sit to/from stand: with moderate assist Goal: Pt Will Transfer Bed To Chair/Chair To Bed Outcome: Progressing Flowsheets (Taken 04/14/2018 1615) Pt will Transfer Bed to Chair/Chair to Bed: with mod assist Goal: Pt Will Ambulate Outcome: Progressing Flowsheets (Taken 04/14/2018 1615) Pt will Ambulate: 25 feet; with moderate assist; with rolling walker   4:15 PM, 04/14/18 Lonell Grandchild, MPT Physical Therapist with [facility] [phone] office [phone] mobile phone

## 2018-04-14 NOTE — Progress Notes (Signed)
Upon assessing patient, noticed R arm very tight and swollen. Stop IV infusion and elevated extremity. Tried to flush triple lumens and was unsuccessful. Paged MD of findings. MD called back ans asked to hold all IV pushes for tonight and monitor extremity. Will monitor throughout the night.

## 2018-04-14 NOTE — Evaluation (Signed)
Physical Therapy Evaluation Patient Details Name: [name] MRN: [id number] DOB: Nov 22, 1945 Today's Date: 04/14/2018   History of Present Illness  [name]  is a 72 y.o. female, w hypertension, hyperlipidemia, dm2, pvd, CVA, OSA, Rheumatoid arthritis apparently c/o LLQ pain over the past several weeks, just started on abx yesterday, pt notes subjective fever as well as nausea.  Pt denies emesis, constipation, diarrhea, brbpr, black stool.  Pt presented due to worsening abdominal pain today.     Clinical Impression  Patient demonstrates slow labored movement and required much time to sit up at bedside due to c/o pain with pressure to legs/hands, unable to complete a sit to stand due to BLE weakness and c/o severe abdominal/stomach pain.  Patient put back to bed with 2 person assist and bed in head down position to reposition in bed.  Patient will benefit from continued physical therapy in hospital and recommended venue below to increase strength, balance, endurance for safe ADLs and gait.    Follow Up Recommendations SNF    Equipment Recommendations  None recommended by PT    Recommendations for Other Services       Precautions / Restrictions Precautions Precautions: Fall Restrictions Weight Bearing Restrictions: No      Mobility  Bed Mobility Overal bed mobility: Needs Assistance Bed Mobility: Supine to Sit     Supine to sit: Max assist     General bed mobility comments: slow labored movement with much time  Transfers                    Ambulation/Gait                Stairs            Wheelchair Mobility    Modified Rankin (Stroke Patients Only)       Balance Overall balance assessment: Needs assistance Sitting-balance support: Feet supported;No upper extremity supported Sitting balance-Leahy Scale: Fair         Standing balance comment: unable to stand due to weakness, abdominal pain                              Pertinent Vitals/Pain Pain Assessment: Faces Faces Pain Scale: Hurts whole lot Pain Location: abdomen/stomach Pain Descriptors / Indicators: Cramping;Grimacing;Sharp Pain Intervention(s): Limited activity within patient's tolerance;Monitored during session    Home Living Family/patient expects to be discharged to:: Private residence Living Arrangements: Spouse/significant other Available Help at Discharge: Family Type of Home: [place]: Environmental consultant - 4 wheels;Wheelchair - [employer];Shower seat;Cane - single point      Prior Function Level of Independence: Independent with assistive device(s)         Comments: household ambulator with Rollator     Hand Dominance        Extremity/Trunk Assessment   Upper Extremity Assessment Upper Extremity Assessment: Generalized weakness    Lower Extremity Assessment Lower Extremity Assessment: Generalized weakness    Cervical / Trunk Assessment Cervical / Trunk Assessment: Normal  Communication   Communication: No difficulties  Cognition Arousal/Alertness: Awake/alert Behavior During Therapy: WFL for tasks assessed/performed Overall Cognitive Status: Within Functional Limits for tasks assessed                                        General Comments  Exercises     Assessment/Plan    PT Assessment Patient needs continued PT services  PT Problem List Decreased strength;Decreased activity tolerance;Decreased balance;Decreased mobility;Pain(stomach pain)       PT Treatment Interventions Gait training;Stair training;Functional mobility training;Therapeutic activities;Therapeutic exercise;Patient/family education    PT Goals (Current goals can be found in the Care Plan section)  Acute Rehab PT Goals Patient Stated Goal: return home PT Goal Formulation: With patient/family Time For Goal Achievement: 04/28/18 Potential to Achieve Goals: Fair    Frequency Min  3X/week   Barriers to discharge        Co-evaluation               AM-PAC PT "6 Clicks" Daily Activity  Outcome Measure Difficulty turning over in bed (including adjusting bedclothes, sheets and blankets)?: Unable Difficulty moving from lying on back to sitting on the side of the bed? : Unable Difficulty sitting down on and standing up from a chair with arms (e.g., wheelchair, bedside commode, etc,.)?: Unable Help needed moving to and from a bed to chair (including a wheelchair)?: Total Help needed walking in hospital room?: Total Help needed climbing 3-5 steps with a railing? : Total 6 Click Score: 6    End of Session Equipment Utilized During Treatment: Gait belt Activity Tolerance: Patient limited by pain;Patient limited by fatigue Patient left: in bed;with call bell/phone within reach;with bed alarm set;with family/visitor present Nurse Communication: Mobility status PT Visit Diagnosis: Unsteadiness on feet (R26.81);Other abnormalities of gait and mobility (R26.89);Muscle weakness (generalized) (M62.81)    Time: 1552-0802 PT Time Calculation (min) (ACUTE ONLY): 24 min   Charges:   PT Evaluation $PT Eval Moderate Complexity: 1 Mod PT Treatments $Therapeutic Activity: 23-37 mins        4:13 PM, 04/14/18 Lonell Grandchild, MPT Physical Therapist with [facility] [phone] office [phone] mobile phone

## 2018-04-14 NOTE — Progress Notes (Signed)
  Subjective: Patient appears depressed.  Denies any abdominal pain.  Is starting to move her bowels.  Objective: Vital Braun in last 24 hours: Temp:  [98 F (36.7 C)-99.2 F (37.3 C)] 99.2 F (37.3 C) (11/04 0547) Pulse Rate:  [82-137] 92 (11/04 0547) Resp:  [12-18] 18 (11/04 0547) BP: (101-149)/(58-84) 132/65 (11/04 0547) SpO2:  [96 %-100 %] 98 % (11/04 0547) Weight:  [128.9 kg] 128.9 kg (11/04 0500) Last BM Date: 04/13/18  Intake/Output from previous day: 11/03 0701 - 11/04 0700 In: 361.9 [I.V.:261.9; IV Piggyback:100] Out: 1700 [Urine:1700] Intake/Output this shift: Total I/O In: 30 [I.V.:30] Out: -   General appearance: alert, cooperative, fatigued and no distress GI: soft, non-tender; bowel sounds normal; no masses,  no organomegaly  Lab Results:  Recent Labs    04/13/18 0438  WBC 11.7*  HGB 10.5*  HCT 32.8*  PLT 290   BMET Recent Labs    04/13/18 0438  NA 141  K 4.5  CL 112*  CO2 24  GLUCOSE 109*  BUN 35*  CREATININE 0.77  CALCIUM 8.5*   PT/INR No results for input(s): LABPROT, INR in the last 72 hours.  Studies/Results: No results found.  Anti-infectives: Anti-infectives (From admission, onward)   Start     Dose/Rate Route Frequency Ordered Stop   04/09/18 2200  meropenem (MERREM) 1 g in sodium chloride 0.9 % 100 mL IVPB     1 g 200 mL/hr over 30 Minutes Intravenous Every 8 hours 04/09/18 1244     04/06/18 1300  meropenem (MERREM) 1 g in sodium chloride 0.9 % 100 mL IVPB  Status:  Discontinued     1 g 200 mL/hr over 30 Minutes Intravenous Every 12 hours 04/06/18 1145 04/09/18 1244   04/06/18 1145  meropenem (MERREM) 1 g in sodium chloride 0.9 % 100 mL IVPB  Status:  Discontinued     1 g 200 mL/hr over 30 Minutes Intravenous Every 8 hours 04/06/18 1139 04/06/18 1145   04/06/18 0900  ciprofloxacin (CIPRO) IVPB 400 mg  Status:  Discontinued     400 mg 200 mL/hr over 60 Minutes Intravenous Every 12 hours 04/06/18 0732 04/06/18 1139   04/06/18 0300  metroNIDAZOLE (FLAGYL) IVPB 500 mg  Status:  Discontinued     500 mg 100 mL/hr over 60 Minutes Intravenous Every 8 hours 04/05/18 2335 04/06/18 1139   04/05/18 1930  ciprofloxacin (CIPRO) IVPB 400 mg     400 mg 200 mL/hr over 60 Minutes Intravenous  Once 04/05/18 1925 04/05/18 2217   04/05/18 1930  metroNIDAZOLE (FLAGYL) IVPB 500 mg     500 mg 100 mL/hr over 60 Minutes Intravenous  Once 04/05/18 1925 04/05/18 2102      Assessment/Plan: Impression: Perforated sigmoid colitis, resolving.  No evidence of peritoneal Braun.  Leukocytosis normalized. Plan: I told the patient that she needed to be sitting in wheelchair and starting to ambulate.  She is deconditioned.  We may need to consider short-term rehab for the patient.  Will advance diet as patient tolerates.  LOS: 7 days    [name] 04/14/2018

## 2018-04-14 NOTE — Progress Notes (Signed)
Patient c/o abdominal discomfort this afternoon after receiving tylenol as ordered PRN pain. Patient and family aware that sedating pain mediations have been discontinued per MD since she had sedation with them. Verbalized understanding. Patient asked "how long can I keep dreaming" and then mumbled something else. Alert and oriented to self, place and situation. Reoriented to time. Patient drifted off to sleep while nurse was taking vitals. bedalarm remains on for safety. Encouraged family to call if any concerns or questions. Notified Dr. Jonnie Finner. Also notiifed him patient has had two bowel movements today. Order received to change senokot to PRN at bedtime. Donavan Foil, RN

## 2018-04-14 NOTE — Progress Notes (Signed)
IV metoprolol 5 mg given at 2344. Patients heart rate 82. Vitals are stable. Pt denies chest pain. Will continue to monitor.

## 2018-04-14 NOTE — Progress Notes (Signed)
Patient Demographics:    [name], is a 72 y.o. female, DOB - 1946-03-26, OYD:[id number]  Admit date - 04/05/2018   Admitting Physician [name], MD  Outpatient Primary MD for the patient is [name], MD  LOS - 7   Chief Complaint  Patient presents with  . Abdominal Pain        Subjective:    [name]   Pt per family has been in and out of consciousness the last few days, forgetful and very sleepy.  Patient ate better today than the past few days per family, pt will awaken w/ stimulatoin and falls back asleep.     Assessment  & P lan :    Principal Problem:   Diverticulitis Active Problems:   DM (diabetes mellitus) (HCC)   HTN (hypertension)   Rheumatoid arthritis ([place])   Anemia   Renal insufficiency   Sepsis due to undetermined organism (HCC)   CKD (chronic kidney disease) stage 3, GFR 30-59 ml/min (HCC)   Fibromyalgia   Diverticulitis large intestine w/o perforation or abscess w/o bleeding   Acute diverticulitis   Perforated sigmoid colon (HCC)   Acute renal failure superimposed on stage 3 chronic kidney disease (HCC)  Brief Summary- 72 year old with past medical history relevant for CAD, prior stroke/TIA, dyslipidemia morbid obesity DM2, hypertension rheumatoid arthritis fibromyalgia and chronic pain syndrome admitted 04/05/2018 with acute sigmoid diverticulitis with perforation--Dr. Arnoldo Morale the general surgeon is helping to manage   Plan:- 1)Acute perforated sigmoid diverticulitis---  - d#9 IV abx will dc - +BM, appreciate surg assistance - has not needed surg intervention , f/b gen surg  2) Lethargy/ AMS - per family confused and lethargic last few days.  Has been getting IV dilaudid prn q 2 hrs.  Oversedated now mostly d/t narcotics, needs to get OOB.  Will dc all narcotics and use tylenol for now.  Have d/w family, patient is not able to carry on a  conversation.  3)AKI----acute kidney injury on CKD3. Baseline creat 1.2.   - AKI resolved w/ IVF but creat down to 0.7 now, vol overloaded on exam and up 8-9kg by wt - will start po lasix 20 bid  4)HTN--stable - continue Metoprolol 25 mg daily - continue to hold isosorbide/ olmesartan/amlodipine/HCTZ combo - may use IV Hydralazine 10 mg  Every 4 hours Prn for systolic blood pressure over 160   5)DM2-   Excellent control with A1c of 5.9, avoid over aggressive diabetic/glycemic control due to poor oral intake with risk of hypoglycemia, Use Novolog/Humalog Sliding scale insulin with Accu-Cheks/Fingersticks as ordered   6)H/o CAD/TIA--- previously on Plavix and pravastatin, c/n Plavix as no surgical procedures planned  7) Debility - needs to get up, will get PT eval when patient is more alert and awake  8)Sepsis--- patient met sepsis criteria on admission due to perforated sigmoid diverticulitis as in  #1 above, now is SP IV abx course, dc'd abx today 11/4  9) acute on chronic anemia--- suspect some degree of anemia chronic disease in patient with rheumatoid arthritis, H&H improved and currently stable at 10.5 after transfusion of 2 units of packed red blood cells on 04/07/2018  Disposition/Need for in-Hospital Stay- patient unable to be discharged at this time due to debility  and oversedation, needs to get OOB, stopping all narcotics  Code Status : full  Disposition Plan  : TBD, may need SNF  Consults  :  Gen Surgery  DVT Prophylaxis  :   SCDs  Lab Results  Component Value Date   PLT 290 04/13/2018    Inpatient Medications  Scheduled Meds: . albuterol  2.5 mg Nebulization Once  . Chlorhexidine Gluconate Cloth  6 each Topical Daily  . feeding supplement (ENSURE ENLIVE)  237 mL Oral TID BM  . furosemide  20 mg Oral BID  . latanoprost  1 drop Both Eyes QHS  . metoprolol tartrate  25 mg Oral BID  . pantoprazole  40 mg Oral Daily  . pravastatin  40 mg Oral QHS  . senna-docusate   2 tablet Oral BID  . sodium chloride flush  10-40 mL Intracatheter Q12H  . timolol  1 drop Both Eyes BID   Continuous Infusions:  PRN Meds:.acetaminophen **OR** acetaminophen, hydrALAZINE, ondansetron (ZOFRAN) IV, sodium chloride flush    Anti-infectives (From admission, onward)   Start     Dose/Rate Route Frequency Ordered Stop   04/09/18 2200  meropenem (MERREM) 1 g in sodium chloride 0.9 % 100 mL IVPB  Status:  Discontinued     1 g 200 mL/hr over 30 Minutes Intravenous Every 8 hours 04/09/18 1244 04/14/18 0944   04/06/18 1300  meropenem (MERREM) 1 g in sodium chloride 0.9 % 100 mL IVPB  Status:  Discontinued     1 g 200 mL/hr over 30 Minutes Intravenous Every 12 hours 04/06/18 1145 04/09/18 1244   04/06/18 1145  meropenem (MERREM) 1 g in sodium chloride 0.9 % 100 mL IVPB  Status:  Discontinued     1 g 200 mL/hr over 30 Minutes Intravenous Every 8 hours 04/06/18 1139 04/06/18 1145   04/06/18 0900  ciprofloxacin (CIPRO) IVPB 400 mg  Status:  Discontinued     400 mg 200 mL/hr over 60 Minutes Intravenous Every 12 hours 04/06/18 0732 04/06/18 1139   04/06/18 0300  metroNIDAZOLE (FLAGYL) IVPB 500 mg  Status:  Discontinued     500 mg 100 mL/hr over 60 Minutes Intravenous Every 8 hours 04/05/18 2335 04/06/18 1139   04/05/18 1930  ciprofloxacin (CIPRO) IVPB 400 mg     400 mg 200 mL/hr over 60 Minutes Intravenous  Once 04/05/18 1925 04/05/18 2217   04/05/18 1930  metroNIDAZOLE (FLAGYL) IVPB 500 mg     500 mg 100 mL/hr over 60 Minutes Intravenous  Once 04/05/18 1925 04/05/18 2102        Objective:   Vitals:   04/14/18 0057 04/14/18 0500 04/14/18 0547 04/14/18 0951  BP: (!) 101/58  132/65   Pulse: 82  92   Resp:   18   Temp:   99.2 F (37.3 C)   TempSrc:   Oral   SpO2: 96%  98% 96%  Weight:  128.9 kg      Wt Readings from Last 3 Encounters:  04/14/18 128.9 kg  02/13/18 116.1 kg  01/13/18 120.7 kg     Intake/Output Summary (Last 24 hours) at 04/14/2018 1113 Last data  filed at 04/14/2018 9169 Gross per 24 hour  Intake 751.85 ml  Output 1700 ml  Net -948.15 ml     Physical Exam Patient is examined daily including today on 04/13/18 , exams remain the same as of yesterday except that has changed   Gen:- Awake Alert, morbidly obese, no acute distress HEENT:- [place].AT, No sclera icterus Neck-Supple  Neck,No JVD,.  Lungs-  CTAB , fairly symmetrical air movement CV- S1, S2 normal, regular Abd-  +ve B.Sounds, Abd Soft, left lower quadrant is less tender without rebound or guarding, no significant distention, no acute abdomen Extremity/Skin:- No  edema,   good pulses, Rt arm PICC line site is clean dry and intact Ext - diffuse UE edema 2+, 1+ LE edema Psych-affect is appropriate, oriented x3 Neuro-generalized weakness without new focal deficits, no tremors, very lethargic and falling asleep during questioning   Data Review:   Micro Results Recent Results (from the past 240 hour(s))  Blood Culture (routine x 2)     Status: None   Collection Time: 04/05/18  7:48 PM  Result Value Ref Range Status   Specimen Description BLOOD LEFT FOREARM  Final   Special Requests   Final    BOTTLES DRAWN AEROBIC AND ANAEROBIC Blood Culture adequate volume   Culture   Final    NO GROWTH 5 DAYS Performed at [facility], [address], [place], [place] [address]    Report Status 04/10/2018 FINAL  Final  Blood Culture (routine x 2)     Status: None   Collection Time: 04/05/18  7:57 PM  Result Value Ref Range Status   Specimen Description RIGHT ANTECUBITAL  Final   Special Requests   Final    BOTTLES DRAWN AEROBIC ONLY Blood Culture adequate volume   Culture   Final    NO GROWTH 5 DAYS Performed at [facility], [address] [place], [place] [address]    Report Status 04/10/2018 FINAL  Final  MRSA PCR Screening     Status: None   Collection Time: 04/07/18  9:08 PM  Result Value Ref Range Status   MRSA by PCR NEGATIVE NEGATIVE Final    Comment:        The GeneXpert  MRSA Assay (FDA approved for NASAL specimens only), is one component of a comprehensive MRSA colonization surveillance program. It is not intended to diagnose MRSA infection nor to guide or monitor treatment for MRSA infections. Performed at [facility], [address], [place], [place] [address]     Radiology Reports Ct Abdomen Pelvis Wo Contrast  Result Date: 04/06/2018 CLINICAL DATA:  72 year old female with concern for perforated diverticulitis. EXAM: CT ABDOMEN AND PELVIS WITHOUT CONTRAST TECHNIQUE: Multidetector CT imaging of the abdomen and pelvis was performed following the standard protocol without IV contrast. COMPARISON:  Chest radiograph dated 04/06/2018 and CT dated 04/05/2018 FINDINGS: Evaluation of this exam is limited in the absence of intravenous contrast. Lower chest: Diffuse interstitial coarsening and streaky atelectasis/scarring of the lung bases. There is mild cardiomegaly. There is hypoattenuation of the cardiac blood pool suggestive of a degree of anemia. Clinical correlation is recommended. There is pneumoperitoneum and small amount of free fluid within the pelvis. Hepatobiliary: Advanced fatty infiltration of the liver. No intrahepatic biliary ductal dilatation. Cholecystectomy. Pancreas: Unremarkable. No pancreatic ductal dilatation or surrounding inflammatory changes. Spleen: Normal in size without focal abnormality. Adrenals/Urinary Tract: The adrenal glands are unremarkable. There is no hydronephrosis on either side. Excreted contrast in the renal collecting systems bilaterally. The visualized ureters appear unremarkable. There is trabeculated appearance of the bladder wall likely related to chronic bladder dysfunction. Correlation with urinalysis recommended to exclude cystitis. Stomach/Bowel: There is extensive sigmoid diverticulosis. There is associated inflammatory changes and perforation of the sigmoid colon. There is increased size of the extraluminal air since  the prior CT. No drainable fluid collection is noted at this time.  There is no bowel obstruction. Normal appendix. Vascular/Lymphatic: There is advanced aortoiliac atherosclerotic disease. No portal venous gas. There is no adenopathy. Reproductive: The uterus is grossly unremarkable. Other: None Musculoskeletal: Degenerative changes of the spine. No acute osseous pathology. IMPRESSION: 1. Perforated sigmoid diverticulitis with increased size of the extraluminal air since the prior CT. No drainable fluid collection noted at this time. 2. No bowel obstruction. Normal appendix. 3. Advanced fatty infiltration of the liver. These results were called by telephone at the time of interpretation on 04/06/2018 at 11:09 pm to nurse Hearn, who verbally acknowledged these results. Electronically Signed   By: Arash  Radparvar M.D.   On: 04/06/2018 23:25   Dg Chest 2 View  Result Date: 04/06/2018 CLINICAL DATA:  Dyspnea, sepsis, pt listless and unable to cooperate EXAM: CHEST - 2 VIEW COMPARISON:  06/27/2016 and 06/18/2016. FINDINGS: On the lateral view, there is evidence of a small amount free intraperitoneal air under the left hemidiaphragm. Lung volumes are low. There is opacity at the bases that is likely due to atelectasis along with bronchovascular crowding. No convincing pneumonia and no evidence of pulmonary edema. 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 Cardiac silhouette is normal in size. No mediastinal hilar masses. No pleural effusion.  No pneumothorax. Skeletal structures are grossly intact. IMPRESSION: 1. Small amount of free intraperitoneal air. Recommend follow-up abdomen and pelvis CT with contrast for further assessment. 2. Somewhat limited study due to low lung volumes. Allowing for this, no acute cardiopulmonary disease. Electronically Signed   By: David  Ormond M.D.   On: 04/06/2018 11:23   Ct Abdomen Pelvis W Contrast  Result Date: 04/05/2018 CLINICAL DATA:  Patient has bilateral lower abdominal  sharp intermittent pain with nausea without vomiting. H/x diverticulitis patient states pain and symptoms similar to past. EXAM: CT ABDOMEN AND PELVIS WITH CONTRAST TECHNIQUE: Multidetector CT imaging of the abdomen and pelvis was performed using the standard protocol following bolus administration of intravenous contrast. CONTRAST:  80mL ISOVUE-300 IOPAMIDOL (ISOVUE-300) INJECTION 61% COMPARISON:  02/06/2018 FINDINGS: Lower chest: There is scarring in both lung bases. Coronary artery calcifications are present. The heart is normal in size. Hepatobiliary: Liver is diffusely low attenuation. No focal liver lesions. Cholecystectomy. Pancreas: Unremarkable. No pancreatic ductal dilatation or surrounding inflammatory changes. Spleen: Normal in size without focal abnormality. Adrenals/Urinary Tract: Adrenal glands are normal. There is symmetric enhancement and excretion from the kidneys. 1 millimeter intrarenal calcification identified in the LOWER pole of the RIGHT kidney, not associated with obstruction. Ureters are unremarkable. The bladder and visualized portion of the urethra are normal. Stomach/Bowel: The stomach is normal in appearance. Small bowel loops are normal in caliber and wall thickness. The appendix is well seen and has a normal appearance. There is significant inflammatory change in the region of sigmoid colon and associated numerous diverticula in this segment. Fluid extends in the sigmoid mesentery. The diseased segment of sigmoid is adjacent to the uterus and the urinary bladder. No evidence for abscess or perforation. Vascular/Lymphatic: There is atherosclerotic calcification of the abdominal aorta. There is normal vascular opacification of the celiac axis, superior mesenteric artery, and inferior mesenteric artery. Normal appearance of the portal venous system and inferior vena cava. Reproductive: The uterus is present and is surrounded by inflammatory changes from the sigmoid diverticulitis. No  adnexal mass. Other: Trace amount of free pelvic fluid. Musculoskeletal: Incompletely imaged fluid attenuation collection within the LEFT hip abductors, measuring at least 7.3 x 4.8 centimeters. The appearance is stable since 2017. There are degenerative   changes in the lumbar spine with 7 millimeters anterolisthesis of L4 on L5. Median sternotomy. IMPRESSION: 1. Acute sigmoid diverticulitis with significant fluid in the sigmoid mesentery. There is no evidence for perforation or abscess. 2. Coronary artery calcifications. 3. Hepatic steatosis.  Cholecystectomy. 4. Nonobstructing intrarenal calculus in the LOWER pole the RIGHT kidney. 5. Normal appendix. 6.  Aortic atherosclerosis. 7. Probably benign ganglion cyst within the LEFT hip abductors. Electronically Signed   By: Nolon Nations M.D.   On: 04/05/2018 22:06   US Arterial Abi (screening Lower Extremity)  Result Date: 03/19/2018 CLINICAL DATA:  72 year old female with chronic bilateral lower extremity pain EXAM: NONINVASIVE PHYSIOLOGIC VASCULAR STUDY OF BILATERAL LOWER EXTREMITIES TECHNIQUE: Evaluation of both lower extremities were performed at rest, including calculation of ankle-brachial indices with single level Doppler, pressure and pulse volume recording. COMPARISON:  None. FINDINGS: Right ABI:  1.2 Left ABI:  1.1 Right Lower Extremity:  Normal arterial waveforms at the ankle. Left Lower Extremity:  Normal arterial waveforms at the ankle. 1.0-1.4 Normal IMPRESSION: Normal examination. No evidence of hemodynamically significant peripheral arterial disease. Signed, Criselda Peaches, MD, [facility] Radiology Electronically Signed   By: Jacqulynn Cadet M.D.   On: 03/19/2018 16:28   Dg Chest Port 1 View  Result Date: 04/11/2018 CLINICAL DATA:  PICC placement. EXAM: PORTABLE CHEST 1 VIEW COMPARISON:  Chest radiograph 04/06/2018. FINDINGS: Cardiomegaly.  Mild vascular congestion.  Prior CABG.  RIGHT arm PICC line tip appears to lie distal SVC. This is marked with an arrow. No pneumothorax. IMPRESSION: RIGHT arm PICC line tip appears to lie distal SVC. No pneumothorax. Cardiomegaly with mild vascular congestion, stable from priors. Electronically Signed   By: Staci Righter M.D.   On: 04/11/2018 09:34     CBC Recent Labs  Lab 04/09/18 0355 04/09/18 2139 04/10/18 0351 04/11/18 0920 04/13/18 0438  WBC 13.2* 15.1* 14.4* 11.1* 11.7*  HGB 11.0* 11.1* 10.9* 11.3* 10.5*  HCT 33.7* 34.2* 33.0* 36.6 32.8*  PLT 313 322 314 273 290  MCV 91.1 91.2 92.2 94.8 94.5  MCH 29.7 29.6 30.4 29.3 30.3  MCHC 32.6 32.5 33.0 30.9 32.0  RDW 17.2* 17.4* 17.2* 18.2* 17.4*    Chemistries  Recent Labs  Lab 04/08/18 0415 04/09/18 0355 04/09/18 2139 04/10/18 0351 04/11/18 0920 04/13/18 0438  NA 141 141 139 141 143 141  K 4.0 4.1 4.6 4.5 4.6 4.5  CL 112* 112* 111 113* 115* 112*  CO2 21* 22 21* 21* 20* 24  GLUCOSE 102* 127* 165* 128* 86 109*  BUN 35* 40* 44* 47* 44* 35*  CREATININE 1.18* 0.99 1.13* 1.11* 0.96 0.77  CALCIUM 7.3* 7.2* 7.3* 7.4* 7.7* 8.5*  MG 1.9  --  1.8  --   --   --   AST 46* 54*  --  41  --   --   ALT 33 36  --  30  --   --   ALKPHOS 48 55  --  51  --   --   BILITOT 0.6 0.5  --  0.7  --   --    ------------------------------------------------------------------------------------------------------------------ No results for input(s): CHOL, HDL, LDLCALC, TRIG, CHOLHDL, LDLDIRECT in the last 72 hours.  Lab Results  Component Value Date   HGBA1C 5.9 (H) 04/07/2018   ------------------------------------------------------------------------------------------------------------------ No results for input(s): TSH, T4TOTAL, T3FREE, THYROIDAB in the last 72 hours.  Invalid input(s): FREET3 ------------------------------------------------------------------------------------------------------------------ No results for input(s): VITAMINB12, FOLATE, FERRITIN, TIBC, IRON, RETICCTPCT  in the  last 72 hours.  Coagulation profile No results for input(s): INR, PROTIME in the last 168 hours.  No results for input(s): DDIMER in the last 72 hours.  Cardiac Enzymes Recent Labs  Lab 04/09/18 2139 04/10/18 0351 04/10/18 0945  TROPONINI 0.07* 0.08* 0.06*   ------------------------------------------------------------------------------------------------------------------    Component Value Date/Time   BNP 34.0 06/14/2014 1415    Sol Blazing M.D on 04/14/2018 at 11:13 AM  Pager---[phone] Go to [email or web address] - password [email or web address] for contact info  [facility] - Office  [phone]

## 2018-04-15 ENCOUNTER — Inpatient Hospital Stay (HOSPITAL_COMMUNITY): Payer: Medicare Other

## 2018-04-15 LAB — CBC
HCT: 31.3 % — ABNORMAL LOW (ref 36.0–46.0)
Hemoglobin: 10.1 g/dL — ABNORMAL LOW (ref 12.0–15.0)
MCH: 29.7 pg (ref 26.0–34.0)
MCHC: 32.3 g/dL (ref 30.0–36.0)
MCV: 92.1 fL (ref 80.0–100.0)
PLATELETS: 205 10*3/uL (ref 150–400)
RBC: 3.4 MIL/uL — ABNORMAL LOW (ref 3.87–5.11)
RDW: 17 % — AB (ref 11.5–15.5)
WBC: 8.9 10*3/uL (ref 4.0–10.5)
nRBC: 0 % (ref 0.0–0.2)

## 2018-04-15 LAB — COMPREHENSIVE METABOLIC PANEL
ALT: 43 U/L (ref 0–44)
ANION GAP: 9 (ref 5–15)
AST: 69 U/L — ABNORMAL HIGH (ref 15–41)
Albumin: 2.1 g/dL — ABNORMAL LOW (ref 3.5–5.0)
Alkaline Phosphatase: 63 U/L (ref 38–126)
BUN: 22 mg/dL (ref 8–23)
CHLORIDE: 104 mmol/L (ref 98–111)
CO2: 26 mmol/L (ref 22–32)
Calcium: 8.3 mg/dL — ABNORMAL LOW (ref 8.9–10.3)
Creatinine, Ser: 0.83 mg/dL (ref 0.44–1.00)
Glucose, Bld: 103 mg/dL — ABNORMAL HIGH (ref 70–99)
POTASSIUM: 3.6 mmol/L (ref 3.5–5.1)
Sodium: 139 mmol/L (ref 135–145)
Total Bilirubin: 1 mg/dL (ref 0.3–1.2)
Total Protein: 5.6 g/dL — ABNORMAL LOW (ref 6.5–8.1)

## 2018-04-15 LAB — GLUCOSE, CAPILLARY
GLUCOSE-CAPILLARY: 97 mg/dL (ref 70–99)
GLUCOSE-CAPILLARY: 94 mg/dL (ref 70–99)
Glucose-Capillary: 86 mg/dL (ref 70–99)
Glucose-Capillary: 88 mg/dL (ref 70–99)
Glucose-Capillary: 97 mg/dL (ref 70–99)
Glucose-Capillary: 98 mg/dL (ref 70–99)

## 2018-04-15 MED ORDER — ALTEPLASE 2 MG IJ SOLR: 2.0000 mg | Freq: Once | INTRAMUSCULAR | Status: DC

## 2018-04-15 MED ORDER — MORPHINE SULFATE (PF) 2 MG/ML IV SOLN
2.0000 mg | Freq: Once | INTRAVENOUS | Status: DC
  Filled 2018-04-15 (×2): qty 1

## 2018-04-15 MED ORDER — SODIUM CHLORIDE 0.9 % IV SOLN
1.0000 g | Freq: Three times a day (TID) | INTRAVENOUS | Status: DC
  Administered 2018-04-16 – 2018-04-27 (×34): 1 g via INTRAVENOUS
  Filled 2018-04-15 (×40): qty 1

## 2018-04-15 MED ORDER — HEPARIN (PORCINE) IN NACL 100-0.45 UNIT/ML-% IJ SOLN
1500.0000 [IU]/h | INTRAMUSCULAR | Status: DC
  Administered 2018-04-15 – 2018-04-16 (×2): 1500 [IU]/h via INTRAVENOUS
  Filled 2018-04-15 (×2): qty 250

## 2018-04-15 MED ORDER — HYDROCODONE-ACETAMINOPHEN 10-325 MG PO TABS
1.0000 | ORAL_TABLET | Freq: Four times a day (QID) | ORAL | Status: DC | PRN
  Administered 2018-04-16 – 2018-04-25 (×15): 1 via ORAL
  Filled 2018-04-15 (×16): qty 1

## 2018-04-15 MED ORDER — ONDANSETRON HCL 4 MG PO TABS
4.0000 mg | ORAL_TABLET | Freq: Once | ORAL | Status: AC
  Administered 2018-04-15: 4 mg via ORAL
  Filled 2018-04-15: qty 1

## 2018-04-15 MED ORDER — HEPARIN BOLUS VIA INFUSION
5000.0000 [IU] | Freq: Once | INTRAVENOUS | Status: AC
  Administered 2018-04-15: 5000 [IU] via INTRAVENOUS
  Filled 2018-04-15: qty 5000

## 2018-04-15 MED ORDER — IOPAMIDOL (ISOVUE-300) INJECTION 61%
50.0000 mL | Freq: Once | INTRAVENOUS | Status: AC | PRN
  Administered 2018-04-15: 30 mL via ORAL

## 2018-04-15 NOTE — Progress Notes (Signed)
Notified MD that IV antibiotic cannot be administered at this time due to one peripheral IV access and heparin drip transfusing. Stated okay to administer once patient transferred to [facility] and PICC line occlusion treated. Donavan Foil, RN

## 2018-04-15 NOTE — Progress Notes (Addendum)
Follow up CT abdomen done is showing multiple fluid collections in the pelvis, have d/w surgery Dr Arnoldo Morale who recommends drainage by IR, he thinks she will require 2 drains minimum.   Pt will need higher of level of care w/o IR here so will plan transfer to [facility].  Will consult IR at [facility] to evaluate for fluid collection drain placement and also IV access if need be. Pt also has new R brachial vein DVT related to the PICC line and the PICC is occluded, planning to TPA the PICC line. Will start IV heparin for RUE DVT when IV access obtained.  Will resume IV abx when IV access obtained also.  Have d/w pt and family and quetsions answered.   Kelly Splinter MD [facility] pgr [phone] 11/03/2017, 9:25 AM

## 2018-04-15 NOTE — Progress Notes (Signed)
Physical Therapy Treatment Patient Details Name: [name] MRN: [id number] DOB: 25-Oct-1945 Today's Date: 04/15/2018    History of Present Illness [name]  is a 72 y.o. female, w hypertension, hyperlipidemia, dm2, pvd, CVA, OSA, Rheumatoid arthritis apparently c/o LLQ pain over the past several weeks, just started on abx yesterday, pt notes subjective fever as well as nausea.  Pt denies emesis, constipation, diarrhea, brbpr, black stool.  Pt presented due to worsening abdominal pain today.     PT Comments    Patient presents seated in chair (assisted by nursing staff, 2 person assist) and agreeable for therapy.  Patient able complete BLE ROM exercises with verbal cues and some demonstration, had difficulty completing left hip raises due to weakness requiring active assistance, unable to stand after 3 attempts due to c/o generalized weakness and abdominal pain.  Patient tolerated staying up in chair after therapy - nursing staff notified that patient will need 2 person assist or mechanical lift to put back to bed.  Patient will benefit from continued physical therapy in hospital and recommended venue below to increase strength, balance, endurance for safe ADLs and gait.    Follow Up Recommendations  SNF     Equipment Recommendations  None recommended by PT    Recommendations for Other Services       Precautions / Restrictions Precautions Precautions: Fall Restrictions Weight Bearing Restrictions: No    Mobility  Bed Mobility               General bed mobility comments: Patient presents up in chair (assisted by nursing staff)  Transfers                    Ambulation/Gait                 Stairs             Wheelchair Mobility    Modified Rankin (Stroke Patients Only)       Balance Overall balance assessment: Needs assistance Sitting-balance support: Feet supported;No upper extremity supported Sitting balance-Leahy Scale: Fair          Standing balance comment: unable to stand due to weakness, abdominal pain                            Cognition Arousal/Alertness: Awake/alert Behavior During Therapy: WFL for tasks assessed/performed Overall Cognitive Status: Within Functional Limits for tasks assessed                                        Exercises General Exercises - Lower Extremity Long Arc Quad: Seated;Strengthening;AROM;Both;10 reps Hip Flexion/Marching: Seated;AROM;AAROM;Strengthening;Both;10 reps Toe Raises: Seated;AROM;Strengthening;Both;10 reps Heel Raises: Seated;AROM;Strengthening;Both;10 reps    General Comments        Pertinent Vitals/Pain Pain Assessment: Faces Faces Pain Scale: Hurts even more Pain Location: abdomen/stomach Pain Descriptors / Indicators: Cramping;Grimacing;Sharp Pain Intervention(s): Limited activity within patient's tolerance;Monitored during session    Home Living                      Prior Function            PT Goals (current goals can now be found in the care plan section) Acute Rehab PT Goals Patient Stated Goal: return home PT Goal Formulation: With patient/family Time For Goal Achievement: 04/28/18 Potential to Achieve Goals:  Fair Progress towards PT goals: Progressing toward goals    Frequency    Min 3X/week      PT Plan Current plan remains appropriate    Co-evaluation              AM-PAC PT "6 Clicks" Daily Activity  Outcome Measure  Difficulty turning over in bed (including adjusting bedclothes, sheets and blankets)?: Unable Difficulty moving from lying on back to sitting on the side of the bed? : Unable Difficulty sitting down on and standing up from a chair with arms (e.g., wheelchair, bedside commode, etc,.)?: Unable Help needed moving to and from a bed to chair (including a wheelchair)?: A Lot Help needed walking in hospital room?: Total Help needed climbing 3-5 steps with a railing? :  Total 6 Click Score: 7    End of Session Equipment Utilized During Treatment: Gait belt Activity Tolerance: Patient limited by fatigue;Patient limited by pain;Patient tolerated treatment well Patient left: in chair;with call bell/phone within reach;with family/visitor present Nurse Communication: Mobility status PT Visit Diagnosis: Unsteadiness on feet (R26.81);Other abnormalities of gait and mobility (R26.89);Muscle weakness (generalized) (M62.81)     Time: 3212-2482 PT Time Calculation (min) (ACUTE ONLY): 31 min  Charges:  $Therapeutic Exercise: 23-37 mins                     11:46 AM, 04/15/18 Lonell Grandchild, MPT Physical Therapist with [facility] 336 [phone] office [phone] mobile phone

## 2018-04-15 NOTE — Clinical Social Work Note (Signed)
Clinical Social Work Assessment  Patient Details  Name: [name] MRN: [id number] Date of Birth: Jul 19, 1945  Date of referral:  04/14/18               Reason for consult:  Facility Placement                Permission sought to share information with:    Permission granted to share information::     Name::        Agency::     Relationship::     Contact Information:  son, Mortimer Fries, [place] Caro [place], husband all at bedside   Housing/Transportation Living arrangements for the past 2 months:  Single Family Home Source of Information:  Patient, Adult Children Patient Interpreter Needed:  None Criminal Activity/Legal Involvement Pertinent to Current Situation/Hospitalization:  No - Comment as needed Significant Relationships:  Adult Children, Spouse Lives with:  Spouse Do you feel safe going back to the place where you live?  Yes Need for family participation in patient care:  Yes (Comment)  Care giving concerns: None identified at baseline.     Social Worker assessment / plan:  In the home, patient ambulates independently in the home. She has a shower chair and is continent.  When in the community she uses an Conservator, museum/gallery in stores and her own Web designer at [employer]. Patient and family are agreeable to short term rehab at [facility].   Employment status:  Retired Forensic scientist:  Medicare PT Recommendations:  [place] / Referral to community resources:  [place]  Patient/Family's Response to care:  Patient and family are agreeable to short term rehab at [facility].   Patient/Family's Understanding of and Emotional Response to Diagnosis, Current Treatment, and Prognosis:  Patient and family understand patient's diagnosis, treatment and prognosis and feel patient can benefit from short term rehab at SNF to get back to her baseline functioning.   Emotional Assessment Appearance:  Appears stated age Attitude/Demeanor/Rapport:    Affect  (typically observed):  Calm, Accepting Orientation:  Oriented to Self, Oriented to Place, Oriented to  Time, Oriented to Situation Alcohol / Substance use:  Not Applicable Psych involvement (Current and /or in the community):  No (Comment)  Discharge Needs  Concerns to be addressed:  Discharge Planning Concerns Readmission within the last 30 days:  No Current discharge risk:  None Barriers to Discharge:  No Barriers Identified   Ihor Gully, LCSW 04/15/2018, 1:31 PM

## 2018-04-15 NOTE — Care Management Note (Signed)
Case Management Note  Patient Details  Name: CHELCEY CAPUTO MRN: [id number] Date of Birth: [birth year]  If discussed at Long Length of Stay Meetings, dates discussed:  04/15/18  Additional Comments:  Sherald Barge, RN 04/15/2018, 12:42 PM

## 2018-04-15 NOTE — Progress Notes (Signed)
Patient to transfer to [facility] [id number]-23C. Called report to [place], receiving nurse. [facility] notified by nursing secretary of need for transport. Awaiting [facility] arrival. MD aware. Donavan Foil, RN

## 2018-04-15 NOTE — Progress Notes (Signed)
  Subjective: Patient still has multiple complaints.  Primarily, she feels tired.  She is having multiple bowel movements.  Her appetite is still depressed.  Objective: Vital signs in last 24 hours: Temp:  [98.2 F (36.8 C)-99.5 F (37.5 C)] 98.6 F (37 C) (11/05 0347) Pulse Rate:  [90-105] 105 (11/05 0642) Resp:  [18] 18 (11/04 1600) BP: (133-164)/(75-91) 164/75 (11/05 0642) SpO2:  [96 %-98 %] 97 % (11/05 0642) Weight:  [135.8 kg] 135.8 kg (11/05 0500) Last BM Date: 04/14/18  Intake/Output from previous day: 11/04 0701 - 11/05 0700 In: 4259 [P.O.:1390; I.V.:30] Out: 506 [Urine:500; Stool:6] Intake/Output this shift: No intake/output data recorded.  General appearance: alert, cooperative and fatigued GI: Soft with minimal tenderness in the left lower quadrant to palpation.  No rigidity noted.  Lab Results:  Recent Labs    04/13/18 0438  WBC 11.7*  HGB 10.5*  HCT 32.8*  PLT 290   BMET Recent Labs    04/13/18 0438  NA 141  K 4.5  CL 112*  CO2 24  GLUCOSE 109*  BUN 35*  CREATININE 0.77  CALCIUM 8.5*   PT/INR No results for input(s): LABPROT, INR in the last 72 hours.  Studies/Results: No results found.  Anti-infectives: Anti-infectives (From admission, onward)   Start     Dose/Rate Route Frequency Ordered Stop   04/09/18 2200  meropenem (MERREM) 1 g in sodium chloride 0.9 % 100 mL IVPB  Status:  Discontinued     1 g 200 mL/hr over 30 Minutes Intravenous Every 8 hours 04/09/18 1244 04/14/18 0944   04/06/18 1300  meropenem (MERREM) 1 g in sodium chloride 0.9 % 100 mL IVPB  Status:  Discontinued     1 g 200 mL/hr over 30 Minutes Intravenous Every 12 hours 04/06/18 1145 04/09/18 1244   04/06/18 1145  meropenem (MERREM) 1 g in sodium chloride 0.9 % 100 mL IVPB  Status:  Discontinued     1 g 200 mL/hr over 30 Minutes Intravenous Every 8 hours 04/06/18 1139 04/06/18 1145   04/06/18 0900  ciprofloxacin (CIPRO) IVPB 400 mg  Status:  Discontinued     400  mg 200 mL/hr over 60 Minutes Intravenous Every 12 hours 04/06/18 0732 04/06/18 1139   04/06/18 0300  metroNIDAZOLE (FLAGYL) IVPB 500 mg  Status:  Discontinued     500 mg 100 mL/hr over 60 Minutes Intravenous Every 8 hours 04/05/18 2335 04/06/18 1139   04/05/18 1930  ciprofloxacin (CIPRO) IVPB 400 mg     400 mg 200 mL/hr over 60 Minutes Intravenous  Once 04/05/18 1925 04/05/18 2217   04/05/18 1930  metroNIDAZOLE (FLAGYL) IVPB 500 mg     500 mg 100 mL/hr over 60 Minutes Intravenous  Once 04/05/18 1925 04/05/18 2102      Assessment/Plan: Impression: Perforated sigmoid diverticulitis, resolving.  Agree with follow-up CAT scan to rule out any intra-abdominal abscess.  Antibiotics have been stopped.  Discussed with Dr. Jonnie Finner.  LOS: 8 days    Aviva Signs 04/15/2018

## 2018-04-15 NOTE — Progress Notes (Signed)
Tele battery replaced this am during bedside report. Patient noted to have heart rate tachy in the 140s. Regular rhythm on ausculation. Patient asymptomatic lying in bed. States she has just been changed and having some abdominal discomfort. Notified Dr. Jonnie Finner. Donavan Foil, RN

## 2018-04-15 NOTE — Progress Notes (Signed)
ANTICOAGULATION CONSULT NOTE - Initial Consult  Pharmacy Consult for heparin Indication: RUE DVT  Allergies  Allergen Reactions  . Fentanyl Anaphylaxis and Shortness Of Breath  . Lactose Intolerance (Gi) Other (See Comments)    G.I. Upset  . Butrans [Buprenorphine] Rash and Other (See Comments)    Infected skin underneath application  . Penicillins Rash    Facial rash Has patient had a PCN reaction causing immediate rash, facial/tongue/throat swelling, SOB or lightheadedness with hypotension: Yes Has patient had a PCN reaction causing severe rash involving mucus membranes or skin necrosis: No Has patient had a PCN reaction that required hospitalization No Has patient had a PCN reaction occurring within the last 10 years: Yes If all of the above answers are "NO", then may proceed with Cephalosporin use.   . Simvastatin Rash    Patient Measurements: Weight: 265 lb 6.9 oz (120.4 kg) Heparin Dosing Weight: 86 kg  Vital Signs: Temp: 97.9 F (36.6 C) (11/05 1437) Temp Source: Oral (11/05 0642) BP: 145/80 (11/05 1437) Pulse Rate: 96 (11/05 1437)  Labs: Recent Labs    04/13/18 0438 04/15/18 0859  HGB 10.5* 10.1*  HCT 32.8* 31.3*  PLT 290 205  CREATININE 0.77 0.83    Estimated Creatinine Clearance: 78.5 mL/min (by C-G formula based on SCr of 0.83 mg/dL).   Medical History: Past Medical History:  Diagnosis Date  . Anginal pain [facility])    sees Dr Einar Gip.   . Arthritis    rheumatoid ..   . Diabetes mellitus without complication (HCC)    Metformin 2.3.2017  . Diverticulitis   . Dysrhythmia   . Fibromyalgia   . GERD (gastroesophageal reflux disease)   . High cholesterol   . Hypertension   . Liver disease 08- 2012   per Dr Dagmar Hait pt has enlarged liver  . Peripheral vascular disease (HCC)    legs  . Pneumonia 06/17/2016  . Sleep apnea    sleep study  oct 2012  . Stroke [facility]) 2011   [place]      Medications:  Medications Prior to Admission  Medication Sig  Dispense Refill Last Dose  . Blood Glucose Monitoring Suppl (FREESTYLE LITE) DEVI See admin instructions.  0 04/05/2018 at Unknown time  . ciprofloxacin (CIPRO) 500 MG/5ML (10%) suspension Take by mouth 2 (two) times daily.   04/05/2018 at Unknown time  . clopidogrel (PLAVIX) 75 MG tablet Take 1 tablet (75 mg total) by mouth daily with breakfast. 90 tablet 6 04/05/2018 at 0800  . Dexlansoprazole 30 MG capsule Take 40 mg by mouth daily.   04/05/2018 at Unknown time  . diclofenac sodium (VOLTAREN) 1 % GEL Apply 2 g topically 4 (four) times daily as needed (Pain).    04/05/2018 at Unknown time  . glipiZIDE (GLUCOTROL) 10 MG tablet Take 10 mg by mouth daily before breakfast.   04/05/2018 at Unknown time  . HYDROcodone-acetaminophen (NORCO) 10-325 MG tablet Take 1 tablet by mouth 4 (four) times daily.   04/05/2018 at Unknown time  . IRON PO Take 1 tablet by mouth daily.   04/05/2018 at Unknown time  . isosorbide dinitrate (ISORDIL) 5 MG tablet Take 1 tablet (5 mg total) by mouth 3 (three) times daily before meals. 90 tablet 1 04/05/2018 at Unknown time  . leflunomide (ARAVA) 20 MG tablet Take 20 mg by mouth daily.   04/05/2018 at Unknown time  . metoprolol succinate (TOPROL-XL) 25 MG 24 hr tablet Take 25 mg by mouth daily.   04/05/2018 at 0800  .  metroNIDAZOLE (FLAGYL) 250 MG tablet Take 250 mg by mouth 3 (three) times daily.   04/05/2018 at Unknown time  . Olmesartan-amLODIPine-HCTZ (TRIBENZOR) 40-5-25 MG TABS Take 1 tablet by mouth daily.    04/05/2018 at Unknown time  . pravastatin (PRAVACHOL) 40 MG tablet Take 40 mg by mouth at bedtime.   04/05/2018 at Unknown time  . predniSONE (DELTASONE) 5 MG tablet Take 5 mg by mouth daily with breakfast.   04/05/2018 at Unknown time  . pregabalin (LYRICA) 75 MG capsule Take 75 mg by mouth 3 (three) times daily.   04/05/2018 at Unknown time  . Tafluprost (ZIOPTAN) 0.0015 % SOLN Place 1 drop into both eyes at bedtime.    04/05/2018 at Unknown time  . temazepam  (RESTORIL) 30 MG capsule Take 30 mg by mouth at bedtime.   04/05/2018 at Unknown time  . timolol (BETIMOL) 0.5 % ophthalmic solution Place 1 drop into both eyes 2 (two) times daily.    04/05/2018 at Unknown time  . Tofacitinib Citrate (XELJANZ) 5 MG TABS Take 1 tablet by mouth daily.   04/05/2018 at Unknown time  . VITAMIN D, ERGOCALCIFEROL, PO Take 50,000 Units by mouth once a week. Thursdays   04/05/2018 at Unknown time  . docusate sodium (COLACE) 100 MG capsule Take 2 capsules (200 mg total) by mouth at bedtime. (Patient taking differently: Take 200 mg by mouth as needed. ) 10 capsule 0 unknown    Assessment: Pharmacy consulted to dose heparin in patient with RUE DVT due to PICC line.  Due to PICC line problems, are currently working on IV access.  Goal of Therapy:  Heparin level 0.3-0.7 units/ml Monitor platelets by anticoagulation protocol: Yes   Plan:  Give 5000 units bolus x 1 Start heparin infusion at 1500 units/hr Check anti-Xa level in 8 hours and daily while on heparin Continue to monitor H&H and platelets  Revonda Standard Rosario Duey 04/15/2018,2:56 PM

## 2018-04-15 NOTE — Progress Notes (Addendum)
Patient Demographics:    [name], is a 72 y.o. female, DOB - [birth year], AYT:[id number]  Admit date - 04/05/2018   Admitting Physician [name], MD  Outpatient Primary MD for the patient is [name], MD  LOS - 8   Chief Complaint  Patient presents with  . Abdominal Pain        Subjective:    [name]   Pt per family has been in and out of consciousness the last few days, forgetful and very sleepy.  Patient ate better today than the past few days per family, pt will awaken w/ stimulatoin and falls back asleep.     Assessment  & P lan :    Principal Problem:   Diverticulitis Active Problems:   DM (diabetes mellitus) (HCC)   HTN (hypertension)   Rheumatoid arthritis ([place])   Anemia   Renal insufficiency   Sepsis due to undetermined organism (HCC)   CKD (chronic kidney disease) stage 3, GFR 30-59 ml/min (HCC)   Fibromyalgia   Diverticulitis large intestine w/o perforation or abscess w/o bleeding   Acute diverticulitis   Perforated sigmoid colon (HCC)   Acute renal failure superimposed on stage 3 chronic kidney disease (HCC)  Brief Summary- 72 year old with past medical history relevant for CAD, prior stroke/TIA, dyslipidemia morbid obesity DM2, hypertension rheumatoid arthritis fibromyalgia and chronic pain syndrome admitted 04/05/2018 with acute sigmoid diverticulitis with perforation--Dr. Arnoldo Morale the general surgeon is helping to manage   Plan:- 1) Acute perforated sigmoid diverticulitis---  -  SP 9 day IV abx - +diarrhea, have dc'd scheduled senokot - has not needed surg intervention , f/b gen surg  - have d/w surg will ^ diet to soft and get OOB, they will order f/u CT as well  2) Lethargy/ AMS - resolved off of IV pain meds, ok to get OOB now  3) AKI----acute kidney injury on CKD3. Baseline creat 1.2.   - AKI resolved w/ IVF but creat down to 0.7 now, vol overloaded  on exam and up 8-9kg by wt - started po lasix 20 bid  4)HTN--stable - continue Metoprolol 25 mg bid - cont holding home isosorbide/ olmesartan/amlodipine/HCTZ combo   5) DM2-   BS's are good, cont Novolog/Humalog Sliding scale insulin  6) H/o CAD sp CABG /hx TIA--- previously on Plavix and pravastatin, c/n Plavix as no surgical procedures planned  7) Debility - needs to get OOB , PT seeing patient , SNF recommended, SW consulted  8) Sepsis--- patient met sepsis criteria on admission due to perforated sigmoid diverticulitis as in  #1 above, now is SP IV abx course, dc'd abx 11/4  9) acute on chronic anemia--- sp 2 u prbc's, will repeat cbc today   Code Status : full  Disposition Plan  : TBD, will prob need SNF  Consults  :  Gen Surgery  DVT Prophylaxis  :   SCDs  Kelly Splinter MD [facility] pgr [phone] 11/03/2017, 9:25 AM  Lab Results  Component Value Date   PLT 290 04/13/2018    Inpatient Medications  Scheduled Meds: . albuterol  2.5 mg Nebulization Once  . Chlorhexidine Gluconate Cloth  6 each Topical Daily  . feeding supplement (ENSURE ENLIVE)  237 mL Oral TID  BM  . furosemide  20 mg Oral BID  . latanoprost  1 drop Both Eyes QHS  . metoprolol tartrate  25 mg Oral BID  . pantoprazole  40 mg Oral Daily  . pravastatin  40 mg Oral QHS  . sodium chloride flush  10-40 mL Intracatheter Q12H  . timolol  1 drop Both Eyes BID   Continuous Infusions:  PRN Meds:.acetaminophen **OR** acetaminophen, hydrALAZINE, ondansetron (ZOFRAN) IV, senna-docusate, sodium chloride flush    Anti-infectives (From admission, onward)   Start     Dose/Rate Route Frequency Ordered Stop   04/09/18 2200  meropenem (MERREM) 1 g in sodium chloride 0.9 % 100 mL IVPB  Status:  Discontinued     1 g 200 mL/hr over 30 Minutes Intravenous Every 8 hours 04/09/18 1244 04/14/18 0944   04/06/18 1300  meropenem (MERREM) 1 g in sodium chloride 0.9 % 100 mL IVPB  Status:  Discontinued      1 g 200 mL/hr over 30 Minutes Intravenous Every 12 hours 04/06/18 1145 04/09/18 1244   04/06/18 1145  meropenem (MERREM) 1 g in sodium chloride 0.9 % 100 mL IVPB  Status:  Discontinued     1 g 200 mL/hr over 30 Minutes Intravenous Every 8 hours 04/06/18 1139 04/06/18 1145   04/06/18 0900  ciprofloxacin (CIPRO) IVPB 400 mg  Status:  Discontinued     400 mg 200 mL/hr over 60 Minutes Intravenous Every 12 hours 04/06/18 0732 04/06/18 1139   04/06/18 0300  metroNIDAZOLE (FLAGYL) IVPB 500 mg  Status:  Discontinued     500 mg 100 mL/hr over 60 Minutes Intravenous Every 8 hours 04/05/18 2335 04/06/18 1139   04/05/18 1930  ciprofloxacin (CIPRO) IVPB 400 mg     400 mg 200 mL/hr over 60 Minutes Intravenous  Once 04/05/18 1925 04/05/18 2217   04/05/18 1930  metroNIDAZOLE (FLAGYL) IVPB 500 mg     500 mg 100 mL/hr over 60 Minutes Intravenous  Once 04/05/18 1925 04/05/18 2102        Objective:   Vitals:   04/14/18 1600 04/14/18 2052 04/15/18 0500 04/15/18 0642  BP: 133/78 (!) 148/91  (!) 164/75  Pulse: 93 90  (!) 105  Resp: 18     Temp: 99.5 F (37.5 C) 98.2 F (36.8 C)  98.6 F (37 C)  TempSrc: Oral Oral  Oral  SpO2: 98% 98%  97%  Weight:   135.8 kg     Wt Readings from Last 3 Encounters:  04/15/18 135.8 kg  02/13/18 116.1 kg  01/13/18 120.7 kg     Intake/Output Summary (Last 24 hours) at 04/15/2018 0824 Last data filed at 04/15/2018 0420 Gross per 24 hour  Intake 1420 ml  Output 506 ml  Net 914 ml     Physical Exam Patient is examined daily including today on 04/13/18 , exams remain the same as of yesterday except that has changed   Gen:- Awake Alert, morbidly obese, no acute distress HEENT:- [place].AT, No sclera icterus Neck-Supple Neck,No JVD,.  Lungs-  CTAB , fairly symmetrical air movement CV- S1, S2 normal, regular Abd-  +ve B.Sounds, Abd Soft, left lower quadrant is less tender without rebound or guarding, no significant distention, no acute abdomen Extremity/Skin:-  No  edema,   good pulses, Rt arm PICC line site is clean dry and intact Ext - diffuse UE edema 2+, 1+ LE edema Psych-affect is appropriate, oriented x3 Neuro-generalized weakness without new focal deficits, no tremors, very lethargic and falling asleep during questioning  Data Review:   Micro Results Recent Results (from the past 240 hour(s))  Blood Culture (routine x 2)     Status: None   Collection Time: 04/05/18  7:48 PM  Result Value Ref Range Status   Specimen Description BLOOD LEFT FOREARM  Final   Special Requests   Final    BOTTLES DRAWN AEROBIC AND ANAEROBIC Blood Culture adequate volume   Culture   Final    NO GROWTH 5 DAYS Performed at [facility], [address] [place], [place] [address]    Report Status 04/10/2018 FINAL  Final  Blood Culture (routine x 2)     Status: None   Collection Time: 04/05/18  7:57 PM  Result Value Ref Range Status   Specimen Description RIGHT ANTECUBITAL  Final   Special Requests   Final    BOTTLES DRAWN AEROBIC ONLY Blood Culture adequate volume   Culture   Final    NO GROWTH 5 DAYS Performed at [facility], [address], [place], [place] [address]    Report Status 04/10/2018 FINAL  Final  MRSA PCR Screening     Status: None   Collection Time: 04/07/18  9:08 PM  Result Value Ref Range Status   MRSA by PCR NEGATIVE NEGATIVE Final    Comment:        The GeneXpert MRSA Assay (FDA approved for NASAL specimens only), is one component of a comprehensive MRSA colonization surveillance program. It is not intended to diagnose MRSA infection nor to guide or monitor treatment for MRSA infections. Performed at [facility], [address] [place], [place] [address]     Radiology Reports Ct Abdomen Pelvis Wo Contrast  Result Date: 04/06/2018 CLINICAL DATA:  72 year old female with concern for perforated diverticulitis. EXAM: CT ABDOMEN AND PELVIS WITHOUT CONTRAST TECHNIQUE: Multidetector CT imaging of the abdomen and pelvis was  performed following the standard protocol without IV contrast. COMPARISON:  Chest radiograph dated 04/06/2018 and CT dated 04/05/2018 FINDINGS: Evaluation of this exam is limited in the absence of intravenous contrast. Lower chest: Diffuse interstitial coarsening and streaky atelectasis/scarring of the lung bases. There is mild cardiomegaly. There is hypoattenuation of the cardiac blood pool suggestive of a degree of anemia. Clinical correlation is recommended. There is pneumoperitoneum and small amount of free fluid within the pelvis. Hepatobiliary: Advanced fatty infiltration of the liver. No intrahepatic biliary ductal dilatation. Cholecystectomy. Pancreas: Unremarkable. No pancreatic ductal dilatation or surrounding inflammatory changes. Spleen: Normal in size without focal abnormality. Adrenals/Urinary Tract: The adrenal glands are unremarkable. There is no hydronephrosis on either side. Excreted contrast in the renal collecting systems bilaterally. The visualized ureters appear unremarkable. There is trabeculated appearance of the bladder wall likely related to chronic bladder dysfunction. Correlation with urinalysis recommended to exclude cystitis. Stomach/Bowel: There is extensive sigmoid diverticulosis. There is associated inflammatory changes and perforation of the sigmoid colon. There is increased size of the extraluminal air since the prior CT. No drainable fluid collection is noted at this time. There is no bowel obstruction. Normal appendix. Vascular/Lymphatic: There is advanced aortoiliac atherosclerotic disease. No portal venous gas. There is no adenopathy. Reproductive: The uterus is grossly unremarkable. Other: None Musculoskeletal: Degenerative changes of the spine. No acute osseous pathology. IMPRESSION: 1. Perforated sigmoid diverticulitis with increased size of the extraluminal air since the prior CT. No drainable fluid collection noted at this time. 2. No bowel obstruction. Normal appendix. 3.  Advanced fatty infiltration of the liver. These results were called by telephone at the time of  interpretation on 04/06/2018 at 11:09 pm to nurse Hearn, who verbally acknowledged these results. Electronically Signed   By: Anner Crete M.D.   On: 04/06/2018 23:25   Dg Chest 2 View  Result Date: 04/06/2018 CLINICAL DATA:  Dyspnea, sepsis, pt listless and unable to cooperate EXAM: CHEST - 2 VIEW COMPARISON:  06/27/2016 and 06/18/2016. FINDINGS: On the lateral view, there is evidence of a small amount free intraperitoneal air under the left hemidiaphragm. Lung volumes are low. There is opacity at the bases that is likely due to atelectasis along with bronchovascular crowding. No convincing pneumonia and no evidence of pulmonary edema. 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 Cardiac silhouette is normal in size. No mediastinal hilar masses. No pleural effusion.  No pneumothorax. Skeletal structures are grossly intact. IMPRESSION: 1. Small amount of free intraperitoneal air. Recommend follow-up abdomen and pelvis CT with contrast for further assessment. 2. Somewhat limited study due to low lung volumes. Allowing for this, no acute cardiopulmonary disease. Electronically Signed   By: Lajean Manes M.D.   On: 04/06/2018 11:23   Ct Abdomen Pelvis W Contrast  Result Date: 04/05/2018 CLINICAL DATA:  Patient has bilateral lower abdominal sharp intermittent pain with nausea without vomiting. H/x diverticulitis patient states pain and symptoms similar to past. EXAM: CT ABDOMEN AND PELVIS WITH CONTRAST TECHNIQUE: Multidetector CT imaging of the abdomen and pelvis was performed using the standard protocol following bolus administration of intravenous contrast. CONTRAST:  45m ISOVUE-300 IOPAMIDOL (ISOVUE-300) INJECTION 61% COMPARISON:  02/06/2018 FINDINGS: Lower chest: There is scarring in both lung bases. Coronary artery calcifications are present. The heart is normal in size. Hepatobiliary: Liver is diffusely low  attenuation. No focal liver lesions. Cholecystectomy. Pancreas: Unremarkable. No pancreatic ductal dilatation or surrounding inflammatory changes. Spleen: Normal in size without focal abnormality. Adrenals/Urinary Tract: Adrenal glands are normal. There is symmetric enhancement and excretion from the kidneys. 1 millimeter intrarenal calcification identified in the LOWER pole of the RIGHT kidney, not associated with obstruction. Ureters are unremarkable. The bladder and visualized portion of the urethra are normal. Stomach/Bowel: The stomach is normal in appearance. Small bowel loops are normal in caliber and wall thickness. The appendix is well seen and has a normal appearance. There is significant inflammatory change in the region of sigmoid colon and associated numerous diverticula in this segment. Fluid extends in the sigmoid mesentery. The diseased segment of sigmoid is adjacent to the uterus and the urinary bladder. No evidence for abscess or perforation. Vascular/Lymphatic: There is atherosclerotic calcification of the abdominal aorta. There is normal vascular opacification of the celiac axis, superior mesenteric artery, and inferior mesenteric artery. Normal appearance of the portal venous system and inferior vena cava. Reproductive: The uterus is present and is surrounded by inflammatory changes from the sigmoid diverticulitis. No adnexal mass. Other: Trace amount of free pelvic fluid. Musculoskeletal: Incompletely imaged fluid attenuation collection within the LEFT hip abductors, measuring at least 7.3 x 4.8 centimeters. The appearance is stable since 2017. There are degenerative changes in the lumbar spine with 7 millimeters anterolisthesis of L4 on L5. Median sternotomy. IMPRESSION: 1. Acute sigmoid diverticulitis with significant fluid in the sigmoid mesentery. There is no evidence for perforation or abscess. 2. Coronary artery calcifications. 3. Hepatic steatosis.  Cholecystectomy. 4. Nonobstructing  intrarenal calculus in the LOWER pole the RIGHT kidney. 5. Normal appendix. 6.  Aortic atherosclerosis. 7. Probably benign ganglion cyst within the LEFT hip abductors. Electronically Signed   By: ENolon NationsM.D.   On: 04/05/2018 22:06  US Arterial Abi (screening Lower Extremity)  Result Date: 03/19/2018 CLINICAL DATA:  72 year old female with chronic bilateral lower extremity pain EXAM: NONINVASIVE PHYSIOLOGIC VASCULAR STUDY OF BILATERAL LOWER EXTREMITIES TECHNIQUE: Evaluation of both lower extremities were performed at rest, including calculation of ankle-brachial indices with single level Doppler, pressure and pulse volume recording. COMPARISON:  None. FINDINGS: Right ABI:  1.2 Left ABI:  1.1 Right Lower Extremity:  Normal arterial waveforms at the ankle. Left Lower Extremity:  Normal arterial waveforms at the ankle. 1.0-1.4 Normal IMPRESSION: Normal examination. No evidence of hemodynamically significant peripheral arterial disease. Signed, Criselda Peaches, MD, [facility] Radiology Electronically Signed   By: Jacqulynn Cadet M.D.   On: 03/19/2018 16:28   Dg Chest Port 1 View  Result Date: 04/11/2018 CLINICAL DATA:  PICC placement. EXAM: PORTABLE CHEST 1 VIEW COMPARISON:  Chest radiograph 04/06/2018. FINDINGS: Cardiomegaly.  Mild vascular congestion.  Prior CABG. RIGHT arm PICC line tip appears to lie distal SVC. This is marked with an arrow. No pneumothorax. IMPRESSION: RIGHT arm PICC line tip appears to lie distal SVC. No pneumothorax. Cardiomegaly with mild vascular congestion, stable from priors. Electronically Signed   By: Staci Righter M.D.   On: 04/11/2018 09:34     CBC Recent Labs  Lab 04/09/18 0355 04/09/18 2139 04/10/18 0351 04/11/18 0920 04/13/18 0438  WBC 13.2* 15.1* 14.4* 11.1* 11.7*  HGB 11.0* 11.1* 10.9* 11.3* 10.5*  HCT 33.7* 34.2* 33.0* 36.6 32.8*  PLT 313 322 314 273 290  MCV 91.1 91.2 92.2 94.8 94.5  MCH  29.7 29.6 30.4 29.3 30.3  MCHC 32.6 32.5 33.0 30.9 32.0  RDW 17.2* 17.4* 17.2* 18.2* 17.4*    Chemistries  Recent Labs  Lab 04/09/18 0355 04/09/18 2139 04/10/18 0351 04/11/18 0920 04/13/18 0438  NA 141 139 141 143 141  K 4.1 4.6 4.5 4.6 4.5  CL 112* 111 113* 115* 112*  CO2 22 21* 21* 20* 24  GLUCOSE 127* 165* 128* 86 109*  BUN 40* 44* 47* 44* 35*  CREATININE 0.99 1.13* 1.11* 0.96 0.77  CALCIUM 7.2* 7.3* 7.4* 7.7* 8.5*  MG  --  1.8  --   --   --   AST 54*  --  41  --   --   ALT 36  --  30  --   --   ALKPHOS 55  --  51  --   --   BILITOT 0.5  --  0.7  --   --    ------------------------------------------------------------------------------------------------------------------ No results for input(s): CHOL, HDL, LDLCALC, TRIG, CHOLHDL, LDLDIRECT in the last 72 hours.  Lab Results  Component Value Date   HGBA1C 5.9 (H) 04/07/2018   ------------------------------------------------------------------------------------------------------------------ No results for input(s): TSH, T4TOTAL, T3FREE, THYROIDAB in the last 72 hours.  Invalid input(s): FREET3 ------------------------------------------------------------------------------------------------------------------ No results for input(s): VITAMINB12, FOLATE, FERRITIN, TIBC, IRON, RETICCTPCT in the last 72 hours.  Coagulation profile No results for input(s): INR, PROTIME in the last 168 hours.  No results for input(s): DDIMER in the last 72 hours.  Cardiac Enzymes Recent Labs  Lab 04/09/18 2139 04/10/18 0351 04/10/18 0945  TROPONINI 0.07* 0.08* 0.06*   ------------------------------------------------------------------------------------------------------------------    Component Value Date/Time   BNP 34.0 06/14/2014 1415     [facility] - Office  [phone]

## 2018-04-15 NOTE — Progress Notes (Signed)
Melody Hazell Braun is a 72 y.o. female patient admitted from [facility] by [place]. Pt awake, alert - oriented  X 4 - no acute distress noted.  VSS - Blood pressure 134/75, pulse (!) 101, temperature 99.3 F (37.4 C), resp. rate 19, weight 120.4 kg, SpO2 98 %. Per pt pain has decreased to 6/10. IV in place in [place], occlusive dsg intact without redness. IV team came to see her upon admission and reports blood return in [place] line, free of clots and ready to use.  Orientation to room, and floor completed with information packet given to patient. Patient declined safety video at this time.  Admission INP armband ID verified with patient, and in place. Restricted extremity and high fall risk bands placed. SCDs placed.  Fall assessment complete, with patient and family able to verbalize understanding of risk associated with falls, and verbalized understanding to call nsg before up out of bed.  Call light within reach, patient able to voice, and demonstrate understanding.  Skin assessment complete.        Will cont to eval and treat per MD orders.  Valorie Roosevelt, RN 04/15/2018 10:14 PM

## 2018-04-15 NOTE — Progress Notes (Addendum)
Pharmacy Antibiotic Note  [name] is a 72 y.o. female admitted on 04/05/2018 with intra-abdominal infection and fluid collection in pelvis which will require surgery for drainage.  Patient has previously been on meropenem for 8 days earlier in admission.  Pharmacy has been consulted for meropenem dosing.  Plan: Meropenem 1000 mg IV every 8 hours.  Monitor labs, c/s, and patient improvement.  Weight: 265 lb 6.9 oz (120.4 kg)  Temp (24hrs), Avg:98.6 F (37 C), Min:97.9 F (36.6 C), Max:99.5 F (37.5 C)  Recent Labs  Lab 04/09/18 2139 04/10/18 0351 04/11/18 0920 04/13/18 0438 04/15/18 0859  WBC 15.1* 14.4* 11.1* 11.7* 8.9  CREATININE 1.13* 1.11* 0.96 0.77 0.83  LATICACIDVEN 1.2  --   --   --   --     Estimated Creatinine Clearance: 78.5 mL/min (by C-G formula based on SCr of 0.83 mg/dL).    Allergies  Allergen Reactions  . Fentanyl Anaphylaxis and Shortness Of Breath  . Lactose Intolerance (Gi) Other (See Comments)    G.I. Upset  . Butrans [Buprenorphine] Rash and Other (See Comments)    Infected skin underneath application  . Penicillins Rash    Facial rash Has patient had a PCN reaction causing immediate rash, facial/tongue/throat swelling, SOB or lightheadedness with hypotension: Yes Has patient had a PCN reaction causing severe rash involving mucus membranes or skin necrosis: No Has patient had a PCN reaction that required hospitalization No Has patient had a PCN reaction occurring within the last 10 years: Yes If all of the above answers are "NO", then may proceed with Cephalosporin use.   . Simvastatin Rash    Antimicrobials this admission: Cipro 10/26  >> 10/27 Flagyl 10/26 >> 10/27 Merrem 10/27 >> 11/4     11/5 >>  Microbiology results: 10/26 BCx: ng 10/28 MRSA PCR: neg   Thank you for allowing pharmacy to be a part of this patient's care.  Ramond Craver 04/15/2018 3:27 PM

## 2018-04-15 NOTE — Progress Notes (Signed)
Late entry for this afternoon. Notified Dr. Jonnie Finner of CT abdomen and ultrasound of right arm results. Requested nursing notify Dr. Arnoldo Morale of CT abdomen results. Discussed with Dr. Arnoldo Morale who spoke with Dr. Jonnie Finner. Discussed occluded right arm PICC line with MD and that we could have IV team assess for new PICC line or vascular access could potentially place IJ line. Patient is having loose stools and incontinent at times, so she is not good candidate for femoral line placement. Orders received to remove current PICC line and have IV team place new PICC line. Spoke with IV team nurse who called back regarding protocols to leave PICC line in place for occlusion treatment. Provided MD contact number for them to discuss. MD requested nursing attempt peripheral IV access to start heparin drip. Peripheral IV access obtained, heparin drip verified by two RNs and administered as ordered. MD stated to leave PICC line in place and have tPA for occlusion. Will be completed once transferred to [facility] since nursing staff is not checked off on tPA administration. MD aware and stated okay. Patient and family aware of plan and that we are awaiting bed at [facility]. Donavan Foil, RN

## 2018-04-15 NOTE — NC FL2 (Signed)
[place] LEVEL OF CARE SCREENING TOOL     IDENTIFICATION  Patient Name: [name] Birthdate: 1945/11/13 Sex: female Admission Date (Current Location): 04/05/2018  [facility] and [place] Number:  [employer] and Address:  [place] [address] [place]      Provider Number: [id number]  Attending Physician Name and Address:  Roney Jaffe, MD  Relative Name and Phone Number:  Orland Mustard (husband) [phone]    Current Level of Care: Hospital Recommended Level of Care: [place] Prior Approval Number:    Date Approved/Denied:   PASRR Number: [id number]  Discharge Plan: SNF    Current Diagnoses: Patient Active Problem List   Diagnosis Date Noted  . Acute renal failure superimposed on stage 3 chronic kidney disease ([place]) 04/08/2018  . Acute diverticulitis 04/07/2018  . Perforated sigmoid colon ([place]) 04/07/2018  . Diverticulitis large intestine w/o perforation or abscess w/o bleeding   . Sepsis due to undetermined organism ([place]) 04/06/2018  . CKD (chronic kidney disease) stage 3, GFR 30-59 ml/min ([facility]) 04/06/2018  . Fibromyalgia 04/06/2018  . Diverticulitis 04/05/2018  . Anemia 04/05/2018  . Renal insufficiency 04/05/2018  . Abnormal abdominal CT scan 02/11/2018  . Esophageal dysphagia 01/03/2018  . AKI (acute kidney injury) ([place]) 06/27/2016  . Rheumatoid arthritis ([place]) 06/27/2016  . Carpal tunnel syndrome 03/24/2014  . Paresthesias/numbness 03/24/2014  . Obesity, unspecified 09/23/2013  . Pulmonary nodules 07/03/2013  . TIA (transient ischemic attack) 06/20/2013  . Left arm weakness 06/20/2013  . DM (diabetes mellitus) ([place]) 06/20/2013  . HTN (hypertension) 06/20/2013  . Chronic pain 06/20/2013  . Vitreous hemorrhage ([place]) 03/25/2012    Orientation RESPIRATION BLADDER Height & Weight     Self, Time, Situation, Place  O2(see dc summary) Continent Weight: 265 lb 6.9 oz (120.4  kg) Height:     BEHAVIORAL SYMPTOMS/MOOD NEUROLOGICAL BOWEL NUTRITION STATUS      Continent Diet(see dc summary)  AMBULATORY STATUS COMMUNICATION OF NEEDS Skin   Extensive Assist Verbally Normal                       Personal Care Assistance Level of Assistance  Bathing, Feeding, Dressing Bathing Assistance: Limited assistance Feeding assistance: Independent Dressing Assistance: Limited assistance     Functional Limitations Info  Sight, Hearing, Speech Sight Info: Adequate Hearing Info: Adequate Speech Info: Adequate    SPECIAL CARE FACTORS FREQUENCY  PT (By licensed PT)     PT Frequency: 5 times week              Contractures Contractures Info: Not present    Additional Factors Info  Code Status, Allergies Code Status Info: full Allergies Info: Fentanyl, Lactose Intolerance, Butrans, Penicillins, Simvastatin           Current Medications (04/15/2018):  This is the current hospital active medication list Current Facility-Administered Medications  Medication Dose Route Frequency Provider Last Rate Last Dose  . acetaminophen (TYLENOL) tablet 650 mg  650 mg Oral Q6H PRN Tat, Shanon Brow, MD   650 mg at 04/14/18 1452   Or  . acetaminophen (TYLENOL) suppository 650 mg  650 mg Rectal Q6H PRN Tat, David, MD      . albuterol (PROVENTIL) (2.5 MG/3ML) 0.083% nebulizer solution 2.5 mg  2.5 mg Nebulization Once Emokpae, Courage, MD      . Chlorhexidine Gluconate Cloth 2 % PADS 6 each  6 each Topical Daily Tat, Shanon Brow, MD   6 each at  04/13/18 9935  . feeding supplement (ENSURE ENLIVE) (ENSURE ENLIVE) liquid 237 mL  237 mL Oral TID BM Emokpae, Courage, MD   237 mL at 04/14/18 0925  . furosemide (LASIX) tablet 20 mg  20 mg Oral BID Roney Jaffe, MD   20 mg at 04/14/18 2054  . hydrALAZINE (APRESOLINE) injection 10 mg  10 mg Intravenous Q6H PRN Emokpae, Courage, MD      . HYDROcodone-acetaminophen (NORCO) 10-325 MG per tablet 1 tablet  1 tablet Oral Q6H PRN Roney Jaffe, MD       . latanoprost (XALATAN) 0.005 % ophthalmic solution 1 drop  1 drop Both Eyes QHS Orson Eva, MD   1 drop at 04/14/18 2054  . metoprolol tartrate (LOPRESSOR) tablet 25 mg  25 mg Oral BID Roxan Hockey, MD   25 mg at 04/14/18 2054  . ondansetron (ZOFRAN) injection 4 mg  4 mg Intravenous Q6H PRN Orson Eva, MD   4 mg at 04/06/18 2125  . pantoprazole (PROTONIX) EC tablet 40 mg  40 mg Oral Daily Tat, David, MD   40 mg at 04/14/18 0925  . pravastatin (PRAVACHOL) tablet 40 mg  40 mg Oral Benay Pike, MD   40 mg at 04/14/18 2054  . senna-docusate (Senokot-S) tablet 2 tablet  2 tablet Oral QHS PRN Roney Jaffe, MD      . sodium chloride flush (NS) 0.9 % injection 10-40 mL  10-40 mL Intracatheter Therisa Doyne, MD   30 mL at 04/14/18 0921  . sodium chloride flush (NS) 0.9 % injection 10-40 mL  10-40 mL Intracatheter PRN Tat, David, MD      . timolol (TIMOPTIC) 0.5 % ophthalmic solution 1 drop  1 drop Both Eyes BID Tat, David, MD   1 drop at 04/15/18 7017     Discharge Medications: Please see discharge summary for a list of discharge medications.  Relevant Imaging Results:  Relevant Lab Results:   Additional Information SSN: [id number]  Shade Flood, LCSW

## 2018-04-16 ENCOUNTER — Inpatient Hospital Stay (HOSPITAL_COMMUNITY): Payer: Medicare Other

## 2018-04-16 ENCOUNTER — Encounter (HOSPITAL_COMMUNITY): Payer: Self-pay | Admitting: Radiology

## 2018-04-16 DIAGNOSIS — I82402 Acute embolism and thrombosis of unspecified deep veins of left lower extremity: Secondary | ICD-10-CM

## 2018-04-16 LAB — CBC
HCT: 28.2 % — ABNORMAL LOW (ref 36.0–46.0)
Hemoglobin: 9.3 g/dL — ABNORMAL LOW (ref 12.0–15.0)
MCH: 29.7 pg (ref 26.0–34.0)
MCHC: 33 g/dL (ref 30.0–36.0)
MCV: 90.1 fL (ref 80.0–100.0)
Platelets: 172 10*3/uL (ref 150–400)
RBC: 3.13 MIL/uL — ABNORMAL LOW (ref 3.87–5.11)
RDW: 16.5 % — ABNORMAL HIGH (ref 11.5–15.5)
WBC: 8.4 10*3/uL (ref 4.0–10.5)
nRBC: 0 % (ref 0.0–0.2)

## 2018-04-16 LAB — GLUCOSE, CAPILLARY
GLUCOSE-CAPILLARY: 82 mg/dL (ref 70–99)
GLUCOSE-CAPILLARY: 79 mg/dL (ref 70–99)
GLUCOSE-CAPILLARY: 103 mg/dL — AB (ref 70–99)
Glucose-Capillary: 83 mg/dL (ref 70–99)
Glucose-Capillary: 81 mg/dL (ref 70–99)
Glucose-Capillary: 79 mg/dL (ref 70–99)

## 2018-04-16 LAB — HEPARIN LEVEL (UNFRACTIONATED): Heparin Unfractionated: 0.51 IU/mL (ref 0.30–0.70)

## 2018-04-16 MED ORDER — LIDOCAINE HCL 1 % IJ SOLN
INTRAMUSCULAR | Status: AC
  Filled 2018-04-16: qty 20

## 2018-04-16 MED ORDER — ENSURE ENLIVE PO LIQD
237.0000 mL | Freq: Three times a day (TID) | ORAL | Status: DC
  Administered 2018-04-18 – 2018-04-21 (×3): 237 mL via ORAL

## 2018-04-16 MED ORDER — MIDAZOLAM HCL 2 MG/2ML IJ SOLN
INTRAMUSCULAR | Status: AC
  Filled 2018-04-16: qty 4

## 2018-04-16 MED ORDER — HEPARIN (PORCINE) 25000 UT/250ML-% IV SOLN
1500.0000 [IU]/h | INTRAVENOUS | Status: AC
  Administered 2018-04-17 – 2018-04-22 (×9): 1500 [IU]/h via INTRAVENOUS
  Filled 2018-04-16 (×9): qty 250

## 2018-04-16 MED ORDER — HEPARIN (PORCINE) IN NACL 100-0.45 UNIT/ML-% IJ SOLN: 1500.0000 [IU]/h | INTRAMUSCULAR | Status: DC

## 2018-04-16 MED ORDER — HEPARIN (PORCINE) 25000 UT/250ML-% IV SOLN: 1500.0000 [IU]/h | INTRAVENOUS | Status: DC

## 2018-04-16 MED ORDER — HEPARIN (PORCINE) IN NACL 100-0.45 UNIT/ML-% IJ SOLN
1500.0000 [IU]/h | INTRAMUSCULAR | Status: DC
  Filled 2018-04-16: qty 250

## 2018-04-16 MED ORDER — MORPHINE SULFATE (PF) 2 MG/ML IV SOLN
INTRAVENOUS | Status: AC | PRN
  Administered 2018-04-16 (×4): 1 mg via INTRAVENOUS

## 2018-04-16 MED ORDER — MORPHINE SULFATE (PF) 2 MG/ML IV SOLN
INTRAVENOUS | Status: AC
  Filled 2018-04-16: qty 2

## 2018-04-16 MED ORDER — ALBUMIN HUMAN 25 % IV SOLN
12.5000 g | Freq: Once | INTRAVENOUS | Status: AC
  Administered 2018-04-16: 12.5 g via INTRAVENOUS
  Filled 2018-04-16: qty 50

## 2018-04-16 MED ORDER — MIDAZOLAM HCL 2 MG/2ML IJ SOLN
INTRAMUSCULAR | Status: AC | PRN
  Administered 2018-04-16 (×4): 1 mg via INTRAVENOUS

## 2018-04-16 MED ORDER — SODIUM CHLORIDE 0.9% FLUSH
5.0000 mL | Freq: Three times a day (TID) | INTRAVENOUS | Status: DC
  Administered 2018-04-16 – 2018-04-26 (×24): 5 mL

## 2018-04-16 MED FILL — Hydromorphone HCl Inj 2 MG/ML: INTRAMUSCULAR | Qty: 1 | Status: AC

## 2018-04-16 NOTE — Progress Notes (Addendum)
PT Cancellation Note  Patient Details Name: [name] MRN: [id number] DOB: 07-Oct-1945   Cancelled Treatment:    Reason Eval/Treat Not Completed: Patient at procedure or test/unavailable,  Gone to CT, will see as able.   After CT, pt defers any therapy today.  04/16/2018  Donnella Sham, [place] [phone]  (pager) [phone]  (office)   Tessie Fass Willisha Sligar 04/16/2018, 1:36 PM

## 2018-04-16 NOTE — Procedures (Signed)
Pelvic abscess drain times two Pus EBL 0 Comp 0

## 2018-04-16 NOTE — Progress Notes (Signed)
Chief Complaint: Patient was seen in consultation today for abdominal abscess at the request of Dr. Waldron Labs  Referring Physician(s): Dr. Waldron Labs  Supervising Physician: Marybelle Killings  Patient Status: [facility] - In-pt  History of Present Illness: [name] is a 72 y.o. female who was initially admitted to [facility] with abd pain, found to have diverticulitis with small associated gas locule. Was treated conservatively on IV abx but has not made much clinical progress. Repeat CT yesterday shows significant abscess formation. She has been transferred to [facility] for higher level of care and surgery has recommended IR consult for percutaneous drain. PMHx, meds, labs, imaging, allergies reviewed. DVT of (R)UE associated with PICC. Has been on IV heparin gtt. Also has (L)UE PIV Has been NPO Husband at bedside.    Past Medical History:  Diagnosis Date  . Anginal pain [facility])    sees Dr Einar Gip.   . Arthritis    rheumatoid ..   . Diabetes mellitus without complication (HCC)    Metformin 2.3.2017  . Diverticulitis   . Dysrhythmia   . Fibromyalgia   . GERD (gastroesophageal reflux disease)   . High cholesterol   . Hypertension   . Liver disease 08- 2012   per Dr Dagmar Hait pt has enlarged liver  . Peripheral vascular disease (HCC)    legs  . Pneumonia 06/17/2016  . Sleep apnea    sleep study  oct 2012  . Stroke [facility]) 2011   [facility]      Past Surgical History:  Procedure Laterality Date  . [place]  . CHOLECYSTECTOMY  1996  . COLONOSCOPY N/A 02/13/2018   Procedure: COLONOSCOPY;  Surgeon: Rogene Houston, MD;  Location[facility];  Service: Endoscopy;  Laterality: N/A;  8:30  . CORONARY ARTERY BYPASS GRAFT  1986  . ESOPHAGEAL DILATION N/A 01/13/2018   Procedure: ESOPHAGEAL DILATION;  Surgeon: Rogene Houston, MD;  Location[facility];  Service: Endoscopy;  Laterality: N/A;  . ESOPHAGOGASTRODUODENOSCOPY N/A 01/13/2018   Procedure:  ESOPHAGOGASTRODUODENOSCOPY (EGD);  Surgeon: Rogene Houston, MD;  Location: [facility];  Service: Endoscopy;  Laterality: N/A;  . EYE SURGERY  2013   cat ext bilateral .  . EYE SURGERY  2002   laser  . FOREIGN BODY REMOVAL  02/13/2018   Procedure: FOREIGN BODY REMOVAL;  Surgeon: Rogene Houston, MD;  Location[facility];  Service: Endoscopy;;  foreign body removal which appears to be food debri in a stem form removed from colon by DR. Rehman  . [place]- 2007   rod right leg - right wrist plate  . GAS/FLUID EXCHANGE  03/25/2012   Procedure: GAS/FLUID EXCHANGE;  Surgeon: Hayden Pedro, MD;  Location: [place];  Service: Ophthalmology;  Laterality: Right;  . PARS PLANA VITRECTOMY  03/25/2012   Procedure: PARS PLANA VITRECTOMY WITH 25 GAUGE;  Surgeon: Hayden Pedro, MD;  Location: [place];  Service: Ophthalmology;  Laterality: Right;    Allergies: Fentanyl; Lactose intolerance (gi); Butrans [buprenorphine]; Penicillins; and Simvastatin  Medications:  Current Facility-Administered Medications:  .  acetaminophen (TYLENOL) tablet 650 mg, 650 mg, Oral, Q6H PRN, 650 mg at 04/14/18 1452 **OR** acetaminophen (TYLENOL) suppository 650 mg, 650 mg, Rectal, Q6H PRN, Tat, David, MD .  albuterol (PROVENTIL) (2.5 MG/3ML) 0.083% nebulizer solution 2.5 mg, 2.5 mg, Nebulization, Once, Emokpae, Courage, MD .  alteplase (CATHFLO ACTIVASE) injection 2 mg, 2 mg, Intracatheter, Once, Roney Jaffe, MD .  Chlorhexidine  Gluconate Cloth 2 % PADS 6 each, 6 each, Topical, Daily, Tat, David, MD, 6 each at 04/13/18 [phone] .  feeding supplement (ENSURE ENLIVE) (ENSURE ENLIVE) liquid 237 mL, 237 mL, Oral, TID BM, Emokpae, Courage, MD, 237 mL at 04/14/18 0925 .  furosemide (LASIX) tablet 20 mg, 20 mg, Oral, BID, Roney Jaffe, MD, 20 mg at 04/16/18 0901 .  heparin ADULT infusion 100 units/mL (25000 units/238mL sodium chloride 0.45%), 1,500 Units/hr, Intravenous, Continuous, Roney Jaffe, MD, Last  Rate: 15 mL/hr at 04/16/18 0228, 1,500 Units/hr at 04/16/18 0228 .  hydrALAZINE (APRESOLINE) injection 10 mg, 10 mg, Intravenous, Q6H PRN, Emokpae, Courage, MD .  HYDROcodone-acetaminophen (NORCO) 10-325 MG per tablet 1 tablet, 1 tablet, Oral, Q6H PRN, Roney Jaffe, MD, 1 tablet at 04/16/18 1191 .  latanoprost (XALATAN) 0.005 % ophthalmic solution 1 drop, 1 drop, Both Eyes, QHS, Tat, David, MD, 1 drop at 04/14/18 2054 .  meropenem (MERREM) 1 g in sodium chloride 0.9 % 100 mL IVPB, 1 g, Intravenous, Q8H, Roney Jaffe, MD, Last Rate: 200 mL/hr at 04/16/18 0229, 1 g at 04/16/18 0229 .  metoprolol tartrate (LOPRESSOR) tablet 25 mg, 25 mg, Oral, BID, Emokpae, Courage, MD, 25 mg at 04/15/18 2338 .  morphine 2 MG/ML injection 2 mg, 2 mg, Intravenous, Once, Schorr, Rhetta Mura, NP .  ondansetron [facility]) injection 4 mg, 4 mg, Intravenous, Q6H PRN, Tat, David, MD, 4 mg at 04/15/18 2037 .  pantoprazole (PROTONIX) EC tablet 40 mg, 40 mg, Oral, Daily, Tat, David, MD, 40 mg at 04/16/18 0901 .  pravastatin (PRAVACHOL) tablet 40 mg, 40 mg, Oral, QHS, Tat, Shanon Brow, MD, 40 mg at 04/15/18 2338 .  senna-docusate (Senokot-S) tablet 2 tablet, 2 tablet, Oral, QHS PRN, Roney Jaffe, MD .  sodium chloride flush (NS) 0.9 % injection 10-40 mL, 10-40 mL, Intracatheter, Q12H, Tat, David, MD, 30 mL at 04/14/18 0921 .  sodium chloride flush (NS) 0.9 % injection 10-40 mL, 10-40 mL, Intracatheter, PRN, Tat, David, MD .  timolol (TIMOPTIC) 0.5 % ophthalmic solution 1 drop, 1 drop, Both Eyes, BID, Tat, David, MD, 1 drop at 04/15/18 4782    Family History  Problem Relation Age of Onset  . Hypertension Daughter   . Rheum arthritis Maternal Grandmother     Social History   Socioeconomic History  . Marital status: Married    Spouse name: Herbie Baltimore  . Number of children: 2  . Years of education: 36  . Highest education level: Not on file  Occupational History  . Occupation: Retired  Scientific laboratory technician  . Financial resource  strain: Not on file  . Food insecurity:    Worry: Not on file    Inability: Not on file  . Transportation needs:    Medical: Not on file    Non-medical: Not on file  Tobacco Use  . Smoking status: Former Smoker    Packs/day: 0.25    Years: 5.00    Pack years: 1.25    Types: Cigarettes    Last attempt to quit: 06/11/1984    Years since quitting: 33.8  . Smokeless tobacco: Never Used  Substance and Sexual Activity  . Alcohol use: Yes    Comment: occ  . Drug use: No    Types: Hydrocodone  . Sexual activity: Yes  Lifestyle  . Physical activity:    Days per week: Not on file    Minutes per session: Not on file  . Stress: Not on file  Relationships  . Social connections:  Talks on phone: Not on file    Gets together: Not on file    Attends religious service: Not on file    Active member of club or organization: Not on file    Attends meetings of clubs or organizations: Not on file    Relationship status: Not on file  Other Topics Concern  . Not on file  Social History Narrative  . Not on file    Review of Systems: A 12 point ROS discussed and pertinent positives are indicated in the HPI above.  All other systems are negative.  Review of Systems  Vital Signs: BP (!) 129/58 (BP Location: Left Arm)   Pulse 93   Temp 98.3 F (36.8 C)   Resp 19   Ht 5' 5.5" (1.664 m)   Wt 126 kg   SpO2 98%   BMI 45.52 kg/m   Physical Exam  Constitutional: She is oriented to person, place, and time. She appears well-developed. No distress.  HENT:  Head: Normocephalic.  Mouth/Throat: Oropharynx is clear and moist.  Neck: Normal range of motion. No JVD present.  Cardiovascular: Normal rate, regular rhythm and normal heart sounds.  Pulmonary/Chest: Effort normal and breath sounds normal. No respiratory distress.  Abdominal: Soft. There is tenderness.  Neurological: She is alert and oriented to person, place, and time.  Skin: Skin is warm and dry.  Psychiatric: Judgment normal.       Imaging: Ct Abdomen Pelvis Wo Contrast  Result Date: 04/15/2018 CLINICAL DATA:  Acute lower abdominal pain. EXAM: CT ABDOMEN AND PELVIS WITHOUT CONTRAST TECHNIQUE: Multidetector CT imaging of the abdomen and pelvis was performed following the standard protocol without IV contrast. COMPARISON:  CT scan of April 06, 2018. FINDINGS: Lower chest: No acute abnormality. Hepatobiliary: No focal liver abnormality is seen. Status post cholecystectomy. No biliary dilatation. Pancreas: Unremarkable. No pancreatic ductal dilatation or surrounding inflammatory changes. Spleen: Normal in size without focal abnormality. Adrenals/Urinary Tract: Adrenal glands appear normal. Small nonobstructive right renal calculus is noted. No hydronephrosis or renal obstruction is noted. Urinary bladder is unremarkable. Stomach/Bowel: The stomach and appendix are unremarkable. Sigmoid diverticulosis is noted. Some degree of residual diverticulitis cannot be excluded. However, 7.3 x 5.8 cm fluid collection is noted in left lower quadrant, which communicates with smaller fluid collection measuring 5.1 x 3.6 cm. This is concerning for abscess status post previous perforated diverticulitis. Air-fluid collection measuring 8.2 x 5.9 cm is seen in the right lower quadrant also concerning for possible abscess. 8.9 x 5.3 cm fluid and stool collection is seen posterior to the uterus and anterior to the rectum concerning for abscess or contained perforation. Vascular/Lymphatic: Aortic atherosclerosis. No enlarged abdominal or pelvic lymph nodes. Reproductive: Uterus and bilateral adnexa are unremarkable. Other: Stable 6.9 x 4.5 cm fluid collection in left inguinal region. This most likely is benign. Musculoskeletal: No acute or significant osseous findings. IMPRESSION: There are at least 3 fluid collections and probable abscesses seen in the lower abdomen and pelvis, following previously perforated diverticulitis. 7.3 x 5.8 cm fluid  collection is noted in left lower quadrant, as well as air-fluid collection measuring 8.2 x 5.9 cm seen in the right lower quadrant. Also noted is 8.9 x 5.3 cm fluid collection in the pelvis in the pre rectal space which appears to contain stool and fluid. These results will be called to the ordering clinician or representative by the Radiologist Assistant, and communication documented in the PACS or zVision Dashboard. Small nonobstructive right renal calculus. Aortic Atherosclerosis (ICD10-I70.0).  Electronically Signed   By: Marijo Conception, M.D.   On: 04/15/2018 14:10   Ct Abdomen Pelvis Wo Contrast  Result Date: 04/06/2018 CLINICAL DATA:  72 year old female with concern for perforated diverticulitis. EXAM: CT ABDOMEN AND PELVIS WITHOUT CONTRAST TECHNIQUE: Multidetector CT imaging of the abdomen and pelvis was performed following the standard protocol without IV contrast. COMPARISON:  Chest radiograph dated 04/06/2018 and CT dated 04/05/2018 FINDINGS: Evaluation of this exam is limited in the absence of intravenous contrast. Lower chest: Diffuse interstitial coarsening and streaky atelectasis/scarring of the lung bases. There is mild cardiomegaly. There is hypoattenuation of the cardiac blood pool suggestive of a degree of anemia. Clinical correlation is recommended. There is pneumoperitoneum and small amount of free fluid within the pelvis. Hepatobiliary: Advanced fatty infiltration of the liver. No intrahepatic biliary ductal dilatation. Cholecystectomy. Pancreas: Unremarkable. No pancreatic ductal dilatation or surrounding inflammatory changes. Spleen: Normal in size without focal abnormality. Adrenals/Urinary Tract: The adrenal glands are unremarkable. There is no hydronephrosis on either side. Excreted contrast in the renal collecting systems bilaterally. The visualized ureters appear unremarkable. There is trabeculated appearance of the bladder wall likely related to chronic bladder dysfunction.  Correlation with urinalysis recommended to exclude cystitis. Stomach/Bowel: There is extensive sigmoid diverticulosis. There is associated inflammatory changes and perforation of the sigmoid colon. There is increased size of the extraluminal air since the prior CT. No drainable fluid collection is noted at this time. There is no bowel obstruction. Normal appendix. Vascular/Lymphatic: There is advanced aortoiliac atherosclerotic disease. No portal venous gas. There is no adenopathy. Reproductive: The uterus is grossly unremarkable. Other: None Musculoskeletal: Degenerative changes of the spine. No acute osseous pathology. IMPRESSION: 1. Perforated sigmoid diverticulitis with increased size of the extraluminal air since the prior CT. No drainable fluid collection noted at this time. 2. No bowel obstruction. Normal appendix. 3. Advanced fatty infiltration of the liver. These results were called by telephone at the time of interpretation on 04/06/2018 at 11:09 pm to nurse Hearn, who verbally acknowledged these results. Electronically Signed   By: Anner Crete M.D.   On: 04/06/2018 23:25   Dg Chest 2 View  Result Date: 04/06/2018 CLINICAL DATA:  Dyspnea, sepsis, pt listless and unable to cooperate EXAM: CHEST - 2 VIEW COMPARISON:  06/27/2016 and 06/18/2016. FINDINGS: On the lateral view, there is evidence of a small amount free intraperitoneal air under the left hemidiaphragm. Lung volumes are low. There is opacity at the bases that is likely due to atelectasis along with bronchovascular crowding. No convincing pneumonia and no evidence of pulmonary edema. 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 Cardiac silhouette is normal in size. No mediastinal hilar masses. No pleural effusion.  No pneumothorax. Skeletal structures are grossly intact. IMPRESSION: 1. Small amount of free intraperitoneal air. Recommend follow-up abdomen and pelvis CT with contrast for further assessment. 2. Somewhat limited study due to low  lung volumes. Allowing for this, no acute cardiopulmonary disease. Electronically Signed   By: Lajean Manes M.D.   On: 04/06/2018 11:23   Ct Abdomen Pelvis W Contrast  Result Date: 04/05/2018 CLINICAL DATA:  Patient has bilateral lower abdominal sharp intermittent pain with nausea without vomiting. H/x diverticulitis patient states pain and symptoms similar to past. EXAM: CT ABDOMEN AND PELVIS WITH CONTRAST TECHNIQUE: Multidetector CT imaging of the abdomen and pelvis was performed using the standard protocol following bolus administration of intravenous contrast. CONTRAST:  47mL ISOVUE-300 IOPAMIDOL (ISOVUE-300) INJECTION 61% COMPARISON:  02/06/2018 FINDINGS: Lower chest: There is scarring in  both lung bases. Coronary artery calcifications are present. The heart is normal in size. Hepatobiliary: Liver is diffusely low attenuation. No focal liver lesions. Cholecystectomy. Pancreas: Unremarkable. No pancreatic ductal dilatation or surrounding inflammatory changes. Spleen: Normal in size without focal abnormality. Adrenals/Urinary Tract: Adrenal glands are normal. There is symmetric enhancement and excretion from the kidneys. 1 millimeter intrarenal calcification identified in the LOWER pole of the RIGHT kidney, not associated with obstruction. Ureters are unremarkable. The bladder and visualized portion of the urethra are normal. Stomach/Bowel: The stomach is normal in appearance. Small bowel loops are normal in caliber and wall thickness. The appendix is well seen and has a normal appearance. There is significant inflammatory change in the region of sigmoid colon and associated numerous diverticula in this segment. Fluid extends in the sigmoid mesentery. The diseased segment of sigmoid is adjacent to the uterus and the urinary bladder. No evidence for abscess or perforation. Vascular/Lymphatic: There is atherosclerotic calcification of the abdominal aorta. There is normal vascular opacification of the celiac  axis, superior mesenteric artery, and inferior mesenteric artery. Normal appearance of the portal venous system and inferior vena cava. Reproductive: The uterus is present and is surrounded by inflammatory changes from the sigmoid diverticulitis. No adnexal mass. Other: Trace amount of free pelvic fluid. Musculoskeletal: Incompletely imaged fluid attenuation collection within the LEFT hip abductors, measuring at least 7.3 x 4.8 centimeters. The appearance is stable since 2017. There are degenerative changes in the lumbar spine with 7 millimeters anterolisthesis of L4 on L5. Median sternotomy. IMPRESSION: 1. Acute sigmoid diverticulitis with significant fluid in the sigmoid mesentery. There is no evidence for perforation or abscess. 2. Coronary artery calcifications. 3. Hepatic steatosis.  Cholecystectomy. 4. Nonobstructing intrarenal calculus in the LOWER pole the RIGHT kidney. 5. Normal appendix. 6.  Aortic atherosclerosis. 7. Probably benign ganglion cyst within the LEFT hip abductors. Electronically Signed   By: Nolon Nations M.D.   On: 04/05/2018 22:06   US Venous Img Upper Uni Right  Result Date: 04/15/2018 CLINICAL DATA:  None EXAM: RIGHT UPPER EXTREMITY VENOUS DOPPLER ULTRASOUND TECHNIQUE: Gray-scale sonography with graded compression, as well as color Doppler and duplex ultrasound were performed to evaluate the upper extremity deep venous system from the level of the subclavian vein and including the jugular, axillary, basilic, radial, ulnar and upper cephalic vein. Spectral Doppler was utilized to evaluate flow at rest and with distal augmentation maneuvers. COMPARISON:  None. FINDINGS: Contralateral Subclavian Vein: Respiratory phasicity is normal and symmetric with the symptomatic side. No evidence of thrombus. Normal compressibility. Internal Jugular Vein: No evidence of thrombus. Normal compressibility, respiratory phasicity and response to augmentation. Subclavian Vein: No evidence of thrombus.  Normal compressibility, respiratory phasicity and response to augmentation. Axillary Vein: No evidence of thrombus. Normal compressibility, respiratory phasicity and response to augmentation. Cephalic Vein: No evidence of thrombus. Normal compressibility, respiratory phasicity and response to augmentation. Basilic Vein: No evidence of thrombus. Normal compressibility, respiratory phasicity and response to augmentation. Brachial Veins: Occlusive thrombus of the right brachial vein, paired brachial veins. Radial Veins: No evidence of thrombus. Normal compressibility, respiratory phasicity and response to augmentation. Ulnar Veins: No evidence of thrombus. Normal compressibility, respiratory phasicity and response to augmentation. Other Findings:  Edema IMPRESSION: Sonographic survey of the right upper extremity is positive for occlusive DVT of the paired right brachial vein. Electronically Signed   By: Corrie Mckusick D.O.   On: 04/15/2018 14:17   US Arterial Abi (screening Lower Extremity)  Result Date: 03/19/2018 CLINICAL DATA:  72 year old female with chronic bilateral lower extremity pain EXAM: NONINVASIVE PHYSIOLOGIC VASCULAR STUDY OF BILATERAL LOWER EXTREMITIES TECHNIQUE: Evaluation of both lower extremities were performed at rest, including calculation of ankle-brachial indices with single level Doppler, pressure and pulse volume recording. COMPARISON:  None. FINDINGS: Right ABI:  1.2 Left ABI:  1.1 Right Lower Extremity:  Normal arterial waveforms at the ankle. Left Lower Extremity:  Normal arterial waveforms at the ankle. 1.0-1.4 Normal IMPRESSION: Normal examination. No evidence of hemodynamically significant peripheral arterial disease. Signed, Criselda Peaches, MD, [facility] Radiology Electronically Signed   By: Jacqulynn Cadet M.D.   On: 03/19/2018 16:28   Dg Chest Port 1 View  Result Date: 04/11/2018 CLINICAL DATA:  PICC placement. EXAM:  PORTABLE CHEST 1 VIEW COMPARISON:  Chest radiograph 04/06/2018. FINDINGS: Cardiomegaly.  Mild vascular congestion.  Prior CABG. RIGHT arm PICC line tip appears to lie distal SVC. This is marked with an arrow. No pneumothorax. IMPRESSION: RIGHT arm PICC line tip appears to lie distal SVC. No pneumothorax. Cardiomegaly with mild vascular congestion, stable from priors. Electronically Signed   By: Staci Righter M.D.   On: 04/11/2018 09:34    Labs:  CBC: Recent Labs    04/11/18 0920 04/13/18 0438 04/15/18 0859 04/16/18 0423  WBC 11.1* 11.7* 8.9 8.4  HGB 11.3* 10.5* 10.1* 9.3*  HCT 36.6 32.8* 31.3* 28.2*  PLT 273 290 205 172    COAGS: Recent Labs    04/06/18 1952  INR 1.22  APTT 33    BMP: Recent Labs    04/10/18 0351 04/11/18 0920 04/13/18 0438 04/15/18 0859  NA 141 143 141 139  K 4.5 4.6 4.5 3.6  CL 113* 115* 112* 104  CO2 21* 20* 24 26  GLUCOSE 128* 86 109* 103*  BUN 47* 44* 35* 22  CALCIUM 7.4* 7.7* 8.5* 8.3*  CREATININE 1.11* 0.96 0.77 0.83  GFRNONAA 48* 57* >60 >60  GFRAA 56* >60 >60 >60    LIVER FUNCTION TESTS: Recent Labs    04/08/18 0415 04/09/18 0355 04/10/18 0351 04/15/18 0859  BILITOT 0.6 0.5 0.7 1.0  AST 46* 54* 41 69*  ALT 33 36 30 43  ALKPHOS 48 55 51 63  PROT 5.6* 5.2* 5.5* 5.6*  ALBUMIN 2.3* 2.2* 2.2* 2.1*    TUMOR MARKERS: No results for input(s): AFPTM, CEA, CA199, CHROMGRNA in the last 8760 hours.  Assessment and Plan: Diverticulitis with multiple anterior and deep pelvic abscesses. Imaging reviewed, amenable to perc drainage X 2. Hold heparin until after procedure. Risks and benefits discussed with the patient including bleeding, infection, damage to adjacent structures, bowel perforation/fistula connection, and sepsis.  All of the patient's questions were answered, patient is agreeable to proceed. Consent signed and in chart.    Thank you for this interesting consult.  I greatly enjoyed meeting Quanta Robertshaw Sforza and look  forward to participating in their care.  A copy of this report was sent to the requesting provider on this date.  Electronically Signed: Ascencion Dike, PA-C 04/16/2018, 9:10 AM   I spent a total of 25 minutes in face to face in clinical consultation, greater than 50% of which was counseling/coordinating care for perc abscess drains

## 2018-04-16 NOTE — Progress Notes (Signed)
ANTICOAGULATION CONSULT NOTE - Follow Up Consult  Pharmacy Consult for heparin Indication: DVT   Labs: Recent Labs    04/15/18 0859 04/16/18 0423  HGB 10.1* 9.3*  HCT 31.3* 28.2*  PLT 205 172  HEPARINUNFRC  --  0.51  CREATININE 0.83  --     Assessment/Plan:  72yo female therapeutic on heparin with initial dosing for DVT. Will continue gtt at current rate and confirm stable with additional level.   Wynona Neat, PharmD, BCPS  04/16/2018,6:28 AM

## 2018-04-16 NOTE — Consult Note (Signed)
Pt out of room to IR, RN to call IV Team back when pt is back in room

## 2018-04-16 NOTE — Progress Notes (Signed)
ANTICOAGULATION CONSULT NOTE - Follow Up Consult  Pharmacy Consult for heparin Indication: DVT  Allergies  Allergen Reactions  . Fentanyl Anaphylaxis and Shortness Of Breath  . Lactose Intolerance (Gi) Other (See Comments)    G.I. Upset  . Butrans [Buprenorphine] Rash and Other (See Comments)    Infected skin underneath application  . Penicillins Rash    Facial rash Has patient had a PCN reaction causing immediate rash, facial/tongue/throat swelling, SOB or lightheadedness with hypotension: Yes Has patient had a PCN reaction causing severe rash involving mucus membranes or skin necrosis: No Has patient had a PCN reaction that required hospitalization No Has patient had a PCN reaction occurring within the last 10 years: Yes If all of the above answers are "NO", then may proceed with Cephalosporin use.   . Simvastatin Rash    Patient Measurements: Height: 5' 5.5" (166.4 cm) Weight: 277 lb 12.5 oz (126 kg) IBW/kg (Calculated) : 58.15 Heparin Dosing Weight:  88.7 kg  Vital Signs: Temp: 98.3 F (36.8 C) (11/06 0633) BP: 99/72 (11/06 1440) Pulse Rate: 83 (11/06 1440)  Labs: Recent Labs    04/15/18 0859 04/16/18 0423  HGB 10.1* 9.3*  HCT 31.3* 28.2*  PLT 205 172  HEPARINUNFRC  --  0.51  CREATININE 0.83  --     Estimated Creatinine Clearance: 81.3 mL/min (by C-G formula based on SCr of 0.83 mg/dL).   Assessment: Anticoag: DVT from PICC. IV heparin. HL 0.51. Hgb down to 9.3. Plts down 172. Off this AM for IR drain placement. Spoke with Dr. Barbie Banner who ok'd restart 1 hr post-procedure.  Goal of Therapy:  Heparin level 0.3-0.7 units/ml Monitor platelets by anticoagulation protocol: Yes   Plan:  IV heparin to resume 1 hr after IR procedure: 1500 units/hr. Check heparin level after 6 hrs to confirm level Daily HL and CBC  [name] [name], PharmD, [place] Clinical Staff Pharmacist [name] 04/16/2018,3:27 PM

## 2018-04-16 NOTE — Progress Notes (Signed)
                                           Patient Demographics:    [name], is a 72 y.o. female, DOB - 10/23/1945, MRN:[id number]  Admit date - 04/05/2018   Admitting Physician James Kim, MD  Outpatient Primary MD for the patient is Hall, John Z, MD  LOS - 9   Chief Complaint  Patient presents with  . Abdominal Pain      Brief narrative:  72yo female PMH HTN, DM-II, CKD, RA, H/o CAD s/p CABG (on plavix,), who was admitted to [facility] 10/26 with acute sigmoid diverticulitis without perforation or abscess. Initially felt to be improving on IV merrem, but she continued to have abdominal pain, CT abdomen pelvis was obtained 04/15/2018, was significant for worsening diverticulitis now with multiple abdominal fluid collections.  Patient developed acute right upper extremity DVT in the setting of PICC line, she was transferred from any pain to [facility]: 04/15/2018 for evaluation by IR.   Subjective:    Brenae Bulthuis denies any fever or chills overnight, report some diarrhea, but 1 or 2 episodes per day, reports abdominal pain at baseline,   Assessment  & P lan :    Principal Problem:   Diverticulitis Active Problems:   DM (diabetes mellitus) (HCC)   HTN (hypertension)   Rheumatoid arthritis (HCC)   Anemia   Renal insufficiency   Sepsis due to undetermined organism (HCC)   CKD (chronic kidney disease) stage 3, GFR 30-59 ml/min (HCC)   Fibromyalgia   Diverticulitis large intestine w/o perforation or abscess w/o bleeding   Acute diverticulitis   Perforated sigmoid colon (HCC)   Acute renal failure superimposed on stage 3 chronic kidney disease (HCC)   Sepsis due to acute perforated sigmoid diverticulitis---  -sepsis criteria met on admission - imaging 04/06/2018 on admission significant for perforated sigmoid diverticulitis, was managed conservatively on IV meropenem, clear liquid diet, . -Repeat imaging 04/15/2018 with large abscesses in the  abdomen, transferred to [facility] for further care, general surgery consult greatly appreciated, continue with bowel rest, IR has been consulted to drain abscesses in the abdomen and pelvis. -Patient with low albumin, some anasarca, discussed with general surgery, likely n.p.o. for no more than 1 day, so we will start on Ensure and some supplements tomorrow we will give albumin today given soft blood pressure and need for diuresis.  Acute right upper extremity DVT -In the setting of PICC line insertion, start on anticoagulation, continue with heparin for anticoagulation until no surgery or further procedure is indicated. -Will discontinue PICC line  acute kidney injury on CKD3. Baseline creat 1.2.   - AKI resolved w/ IVF but creat down to 0.7 now, appears to be volume overloaded, continue with diuresis as blood pressure tolerates.  Hypertension -Continue with metoprolol, given soft blood pressure continue to hold home medication including as  isosorbide/ olmesartan/amlodipine/HCTZ combo   DM2 -   BS's are good, cont Novolog/Humalog Sliding scale insulin  H/o CAD sp CABG /hx TIA - previously on Plavix and pravastatin, continue to hold Plavix may need further procedures or surgery  Debility - needs to get OOB , PT seeing patient , SNF recommended, SW consulted   acute on chronic anemia--- sp 2 u prbc's, will repeat cbc today   Code Status : full  Disposition Plan  : TBD, will   prob need SNF  Consults  :  Gen Surgery, IR  DVT Prophylaxis  :   SCDs, heparin GTT   Lab Results  Component Value Date   PLT 172 04/16/2018    Inpatient Medications  Scheduled Meds: . midazolam      . Chlorhexidine Gluconate Cloth  6 each Topical Daily  . furosemide  20 mg Oral BID  . latanoprost  1 drop Both Eyes QHS  . lidocaine      . metoprolol tartrate  25 mg Oral BID  .  morphine injection  2 mg Intravenous Once  . pantoprazole  40 mg Oral Daily  . pravastatin  40 mg Oral QHS  . sodium  chloride flush  10-40 mL Intracatheter Q12H  . timolol  1 drop Both Eyes BID   Continuous Infusions: . heparin Stopped (04/16/18 0930)  . meropenem (MERREM) IV 1 g (04/16/18 1137)   PRN Meds:.acetaminophen **OR** acetaminophen, hydrALAZINE, HYDROcodone-acetaminophen, ondansetron (ZOFRAN) IV, senna-docusate, sodium chloride flush    Anti-infectives (From admission, onward)   Start     Dose/Rate Route Frequency Ordered Stop   04/15/18 1700  meropenem (MERREM) 1 g in sodium chloride 0.9 % 100 mL IVPB     1 g 200 mL/hr over 30 Minutes Intravenous Every 8 hours 04/15/18 1534     04/09/18 2200  meropenem (MERREM) 1 g in sodium chloride 0.9 % 100 mL IVPB  Status:  Discontinued     1 g 200 mL/hr over 30 Minutes Intravenous Every 8 hours 04/09/18 1244 04/14/18 0944   04/06/18 1300  meropenem (MERREM) 1 g in sodium chloride 0.9 % 100 mL IVPB  Status:  Discontinued     1 g 200 mL/hr over 30 Minutes Intravenous Every 12 hours 04/06/18 1145 04/09/18 1244   04/06/18 1145  meropenem (MERREM) 1 g in sodium chloride 0.9 % 100 mL IVPB  Status:  Discontinued     1 g 200 mL/hr over 30 Minutes Intravenous Every 8 hours 04/06/18 1139 04/06/18 1145   04/06/18 0900  ciprofloxacin (CIPRO) IVPB 400 mg  Status:  Discontinued     400 mg 200 mL/hr over 60 Minutes Intravenous Every 12 hours 04/06/18 0732 04/06/18 1139   04/06/18 0300  metroNIDAZOLE (FLAGYL) IVPB 500 mg  Status:  Discontinued     500 mg 100 mL/hr over 60 Minutes Intravenous Every 8 hours 04/05/18 2335 04/06/18 1139   04/05/18 1930  ciprofloxacin (CIPRO) IVPB 400 mg     400 mg 200 mL/hr over 60 Minutes Intravenous  Once 04/05/18 1925 04/05/18 2217   04/05/18 1930  metroNIDAZOLE (FLAGYL) IVPB 500 mg     500 mg 100 mL/hr over 60 Minutes Intravenous  Once 04/05/18 1925 04/05/18 2102        Objective:   Vitals:   04/15/18 2152 04/16/18 0300 04/16/18 0633 04/16/18 1323  BP: 134/75  (!) 129/58 140/82  Pulse: (!) 101  93 92  Resp: 19   (!) 25   Temp: 99.3 F (37.4 C)  98.3 F (36.8 C)   TempSrc:      SpO2: 98%  98% 100%  Weight:  126 kg    Height:  5' 5.5" (1.664 m)      Wt Readings from Last 3 Encounters:  04/16/18 126 kg  02/13/18 116.1 kg  01/13/18 120.7 kg     Intake/Output Summary (Last 24 hours) at 04/16/2018 1330 Last data filed at 04/16/2018 0917 Gross per 24 hour  Intake 357.8 ml  Output -  Net 357.8 ml     Physical Exam  Awake Alert, Oriented X 3, No new F.N deficits, Normal affect Symmetrical Chest wall movement, Good air movement bilaterally, CTAB RRR,No Gallops,Rubs or new Murmurs, No Parasternal Heave +ve B.Sounds, Abd Soft, abdomen tenderness, mainly in the left side, no rebound, no guarding  No Cyanosis, Clubbing, bilateral +1 lower extremity edema, right upper extremity edema, PICC line in right arm      Data Review:   Micro Results Recent Results (from the past 240 hour(s))  MRSA PCR Screening     Status: None   Collection Time: 04/07/18  9:08 PM  Result Value Ref Range Status   MRSA by PCR NEGATIVE NEGATIVE Final    Comment:        The GeneXpert MRSA Assay (FDA approved for NASAL specimens only), is one component of a comprehensive MRSA colonization surveillance program. It is not intended to diagnose MRSA infection nor to guide or monitor treatment for MRSA infections. Performed at [facility], [address] [place], [place] [address]     Radiology Reports Ct Abdomen Pelvis Wo Contrast  Result Date: 04/15/2018 CLINICAL DATA:  Acute lower abdominal pain. EXAM: CT ABDOMEN AND PELVIS WITHOUT CONTRAST TECHNIQUE: Multidetector CT imaging of the abdomen and pelvis was performed following the standard protocol without IV contrast. COMPARISON:  CT scan of April 06, 2018. FINDINGS: Lower chest: No acute abnormality. Hepatobiliary: No focal liver abnormality is seen. Status post cholecystectomy. No biliary dilatation. Pancreas: Unremarkable. No pancreatic ductal dilatation or  surrounding inflammatory changes. Spleen: Normal in size without focal abnormality. Adrenals/Urinary Tract: Adrenal glands appear normal. Small nonobstructive right renal calculus is noted. No hydronephrosis or renal obstruction is noted. Urinary bladder is unremarkable. Stomach/Bowel: The stomach and appendix are unremarkable. Sigmoid diverticulosis is noted. Some degree of residual diverticulitis cannot be excluded. However, 7.3 x 5.8 cm fluid collection is noted in left lower quadrant, which communicates with smaller fluid collection measuring 5.1 x 3.6 cm. This is concerning for abscess status post previous perforated diverticulitis. Air-fluid collection measuring 8.2 x 5.9 cm is seen in the right lower quadrant also concerning for possible abscess. 8.9 x 5.3 cm fluid and stool collection is seen posterior to the uterus and anterior to the rectum concerning for abscess or contained perforation. Vascular/Lymphatic: Aortic atherosclerosis. No enlarged abdominal or pelvic lymph nodes. Reproductive: Uterus and bilateral adnexa are unremarkable. Other: Stable 6.9 x 4.5 cm fluid collection in left inguinal region. This most likely is benign. Musculoskeletal: No acute or significant osseous findings. IMPRESSION: There are at least 3 fluid collections and probable abscesses seen in the lower abdomen and pelvis, following previously perforated diverticulitis. 7.3 x 5.8 cm fluid collection is noted in left lower quadrant, as well as air-fluid collection measuring 8.2 x 5.9 cm seen in the right lower quadrant. Also noted is 8.9 x 5.3 cm fluid collection in the pelvis in the pre rectal space which appears to contain stool and fluid. These results will be called to the ordering clinician or representative by the Radiologist Assistant, and communication documented in the PACS or zVision Dashboard. Small nonobstructive right renal calculus. Aortic Atherosclerosis (ICD10-I70.0). Electronically Signed   By: James  Green Jr, M.D.    On: 04/15/2018 14:10   Ct Abdomen Pelvis Wo Contrast  Result Date: 04/06/2018 CLINICAL DATA:  72-year-old female with concern for perforated diverticulitis. EXAM: CT ABDOMEN AND PELVIS WITHOUT CONTRAST TECHNIQUE: Multidetector CT imaging of the abdomen and pelvis was performed following the standard protocol without   IV contrast. COMPARISON:  Chest radiograph dated 04/06/2018 and CT dated 04/05/2018 FINDINGS: Evaluation of this exam is limited in the absence of intravenous contrast. Lower chest: Diffuse interstitial coarsening and streaky atelectasis/scarring of the lung bases. There is mild cardiomegaly. There is hypoattenuation of the cardiac blood pool suggestive of a degree of anemia. Clinical correlation is recommended. There is pneumoperitoneum and small amount of free fluid within the pelvis. Hepatobiliary: Advanced fatty infiltration of the liver. No intrahepatic biliary ductal dilatation. Cholecystectomy. Pancreas: Unremarkable. No pancreatic ductal dilatation or surrounding inflammatory changes. Spleen: Normal in size without focal abnormality. Adrenals/Urinary Tract: The adrenal glands are unremarkable. There is no hydronephrosis on either side. Excreted contrast in the renal collecting systems bilaterally. The visualized ureters appear unremarkable. There is trabeculated appearance of the bladder wall likely related to chronic bladder dysfunction. Correlation with urinalysis recommended to exclude cystitis. Stomach/Bowel: There is extensive sigmoid diverticulosis. There is associated inflammatory changes and perforation of the sigmoid colon. There is increased size of the extraluminal air since the prior CT. No drainable fluid collection is noted at this time. There is no bowel obstruction. Normal appendix. Vascular/Lymphatic: There is advanced aortoiliac atherosclerotic disease. No portal venous gas. There is no adenopathy. Reproductive: The uterus is grossly unremarkable. Other: None  Musculoskeletal: Degenerative changes of the spine. No acute osseous pathology. IMPRESSION: 1. Perforated sigmoid diverticulitis with increased size of the extraluminal air since the prior CT. No drainable fluid collection noted at this time. 2. No bowel obstruction. Normal appendix. 3. Advanced fatty infiltration of the liver. These results were called by telephone at the time of interpretation on 04/06/2018 at 11:09 pm to nurse Hearn, who verbally acknowledged these results. Electronically Signed   By: Arash  Radparvar M.D.   On: 04/06/2018 23:25   Dg Chest 2 View  Result Date: 04/06/2018 CLINICAL DATA:  Dyspnea, sepsis, pt listless and unable to cooperate EXAM: CHEST - 2 VIEW COMPARISON:  06/27/2016 and 06/18/2016. FINDINGS: On the lateral view, there is evidence of a small amount free intraperitoneal air under the left hemidiaphragm. Lung volumes are low. There is opacity at the bases that is likely due to atelectasis along with bronchovascular crowding. No convincing pneumonia and no evidence of pulmonary edema. 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 Cardiac silhouette is normal in size. No mediastinal hilar masses. No pleural effusion.  No pneumothorax. Skeletal structures are grossly intact. IMPRESSION: 1. Small amount of free intraperitoneal air. Recommend follow-up abdomen and pelvis CT with contrast for further assessment. 2. Somewhat limited study due to low lung volumes. Allowing for this, no acute cardiopulmonary disease. Electronically Signed   By: David  Ormond M.D.   On: 04/06/2018 11:23   Ct Abdomen Pelvis W Contrast  Result Date: 04/05/2018 CLINICAL DATA:  Patient has bilateral lower abdominal sharp intermittent pain with nausea without vomiting. H/x diverticulitis patient states pain and symptoms similar to past. EXAM: CT ABDOMEN AND PELVIS WITH CONTRAST TECHNIQUE: Multidetector CT imaging of the abdomen and pelvis was performed using the standard protocol following bolus administration  of intravenous contrast. CONTRAST:  80mL ISOVUE-300 IOPAMIDOL (ISOVUE-300) INJECTION 61% COMPARISON:  02/06/2018 FINDINGS: Lower chest: There is scarring in both lung bases. Coronary artery calcifications are present. The heart is normal in size. Hepatobiliary: Liver is diffusely low attenuation. No focal liver lesions. Cholecystectomy. Pancreas: Unremarkable. No pancreatic ductal dilatation or surrounding inflammatory changes. Spleen: Normal in size without focal abnormality. Adrenals/Urinary Tract: Adrenal glands are normal. There is symmetric enhancement and excretion from the kidneys. 1 millimeter   intrarenal calcification identified in the LOWER pole of the RIGHT kidney, not associated with obstruction. Ureters are unremarkable. The bladder and visualized portion of the urethra are normal. Stomach/Bowel: The stomach is normal in appearance. Small bowel loops are normal in caliber and wall thickness. The appendix is well seen and has a normal appearance. There is significant inflammatory change in the region of sigmoid colon and associated numerous diverticula in this segment. Fluid extends in the sigmoid mesentery. The diseased segment of sigmoid is adjacent to the uterus and the urinary bladder. No evidence for abscess or perforation. Vascular/Lymphatic: There is atherosclerotic calcification of the abdominal aorta. There is normal vascular opacification of the celiac axis, superior mesenteric artery, and inferior mesenteric artery. Normal appearance of the portal venous system and inferior vena cava. Reproductive: The uterus is present and is surrounded by inflammatory changes from the sigmoid diverticulitis. No adnexal mass. Other: Trace amount of free pelvic fluid. Musculoskeletal: Incompletely imaged fluid attenuation collection within the LEFT hip abductors, measuring at least 7.3 x 4.8 centimeters. The appearance is stable since 2017. There are degenerative changes in the lumbar spine with 7 millimeters  anterolisthesis of L4 on L5. Median sternotomy. IMPRESSION: 1. Acute sigmoid diverticulitis with significant fluid in the sigmoid mesentery. There is no evidence for perforation or abscess. 2. Coronary artery calcifications. 3. Hepatic steatosis.  Cholecystectomy. 4. Nonobstructing intrarenal calculus in the LOWER pole the RIGHT kidney. 5. Normal appendix. 6.  Aortic atherosclerosis. 7. Probably benign ganglion cyst within the LEFT hip abductors. Electronically Signed   By: Elizabeth  Brown M.D.   On: 04/05/2018 22:06   Us Venous Img Upper Uni Right  Result Date: 04/15/2018 CLINICAL DATA:  None EXAM: RIGHT UPPER EXTREMITY VENOUS DOPPLER ULTRASOUND TECHNIQUE: Gray-scale sonography with graded compression, as well as color Doppler and duplex ultrasound were performed to evaluate the upper extremity deep venous system from the level of the subclavian vein and including the jugular, axillary, basilic, radial, ulnar and upper cephalic vein. Spectral Doppler was utilized to evaluate flow at rest and with distal augmentation maneuvers. COMPARISON:  None. FINDINGS: Contralateral Subclavian Vein: Respiratory phasicity is normal and symmetric with the symptomatic side. No evidence of thrombus. Normal compressibility. Internal Jugular Vein: No evidence of thrombus. Normal compressibility, respiratory phasicity and response to augmentation. Subclavian Vein: No evidence of thrombus. Normal compressibility, respiratory phasicity and response to augmentation. Axillary Vein: No evidence of thrombus. Normal compressibility, respiratory phasicity and response to augmentation. Cephalic Vein: No evidence of thrombus. Normal compressibility, respiratory phasicity and response to augmentation. Basilic Vein: No evidence of thrombus. Normal compressibility, respiratory phasicity and response to augmentation. Brachial Veins: Occlusive thrombus of the right brachial vein, paired brachial veins. Radial Veins: No evidence of thrombus.  Normal compressibility, respiratory phasicity and response to augmentation. Ulnar Veins: No evidence of thrombus. Normal compressibility, respiratory phasicity and response to augmentation. Other Findings:  Edema IMPRESSION: Sonographic survey of the right upper extremity is positive for occlusive DVT of the paired right brachial vein. Electronically Signed   By: Jaime  Wagner D.O.   On: 04/15/2018 14:17   Us Arterial Abi (screening Lower Extremity)  Result Date: 03/19/2018 CLINICAL DATA:  72-year-old female with chronic bilateral lower extremity pain EXAM: NONINVASIVE PHYSIOLOGIC VASCULAR STUDY OF BILATERAL LOWER EXTREMITIES TECHNIQUE: Evaluation of both lower extremities were performed at rest, including calculation of ankle-brachial indices with single level Doppler, pressure and pulse volume recording. COMPARISON:  None. FINDINGS: Right ABI:  1.2 Left ABI:  1.1 Right Lower Extremity:  Normal arterial   waveforms at the ankle. Left Lower Extremity:  Normal arterial waveforms at the ankle. 1.0-1.4 Normal IMPRESSION: Normal examination. No evidence of hemodynamically significant peripheral arterial disease. Signed, Heath K. McCullough, MD, RPVI [facility] Electronically Signed   By: Heath  McCullough M.D.   On: 03/19/2018 16:28   Dg Chest Port 1 View  Result Date: 04/11/2018 CLINICAL DATA:  PICC placement. EXAM: PORTABLE CHEST 1 VIEW COMPARISON:  Chest radiograph 04/06/2018. FINDINGS: Cardiomegaly.  Mild vascular congestion.  Prior CABG. RIGHT arm PICC line tip appears to lie distal SVC. This is marked with an arrow. No pneumothorax. IMPRESSION: RIGHT arm PICC line tip appears to lie distal SVC. No pneumothorax. Cardiomegaly with mild vascular congestion, stable from priors. Electronically Signed   By: John T Curnes M.D.   On: 04/11/2018 09:34     CBC Recent Labs  Lab 04/10/18 0351 04/11/18 0920 04/13/18 0438 04/15/18 0859 04/16/18 0423    WBC 14.4* 11.1* 11.7* 8.9 8.4  HGB 10.9* 11.3* 10.5* 10.1* 9.3*  HCT 33.0* 36.6 32.8* 31.3* 28.2*  PLT 314 273 290 205 172  MCV 92.2 94.8 94.5 92.1 90.1  MCH 30.4 29.3 30.3 29.7 29.7  MCHC 33.0 30.9 32.0 32.3 33.0  RDW 17.2* 18.2* 17.4* 17.0* 16.5*    Chemistries  Recent Labs  Lab 04/09/18 2139 04/10/18 0351 04/11/18 0920 04/13/18 0438 04/15/18 0859  NA 139 141 143 141 139  K 4.6 4.5 4.6 4.5 3.6  CL 111 113* 115* 112* 104  CO2 21* 21* 20* 24 26  GLUCOSE 165* 128* 86 109* 103*  BUN 44* 47* 44* 35* 22  CREATININE 1.13* 1.11* 0.96 0.77 0.83  CALCIUM 7.3* 7.4* 7.7* 8.5* 8.3*  MG 1.8  --   --   --   --   AST  --  41  --   --  69*  ALT  --  30  --   --  43  ALKPHOS  --  51  --   --  63  BILITOT  --  0.7  --   --  1.0   ------------------------------------------------------------------------------------------------------------------ No results for input(s): CHOL, HDL, LDLCALC, TRIG, CHOLHDL, LDLDIRECT in the last 72 hours.  Lab Results  Component Value Date   HGBA1C 5.9 (H) 04/07/2018   ------------------------------------------------------------------------------------------------------------------ No results for input(s): TSH, T4TOTAL, T3FREE, THYROIDAB in the last 72 hours.  Invalid input(s): FREET3 ------------------------------------------------------------------------------------------------------------------ No results for input(s): VITAMINB12, FOLATE, FERRITIN, TIBC, IRON, RETICCTPCT in the last 72 hours.  Coagulation profile No results for input(s): INR, PROTIME in the last 168 hours.  No results for input(s): DDIMER in the last 72 hours.  Cardiac Enzymes Recent Labs  Lab 04/09/18 2139 04/10/18 0351 04/10/18 0945  TROPONINI 0.07* 0.08* 0.06*   ------------------------------------------------------------------------------------------------------------------    Component Value Date/Time   BNP 34.0 06/14/2014 1415      MD [facility] pgr [phone] 11/03/2017, 9:25 AM  [facility] - Office  [phone]        

## 2018-04-16 NOTE — Progress Notes (Signed)
Nutrition Follow-up  DOCUMENTATION CODES:   Morbid obesity  INTERVENTION:   - If unable to advance diet s/p placement of drains, recommend initiation of TPN given inadequate nutrition since initial admission to [facility] (NPO 10/26-10/30, meal completion 0-20% since 10/30)  - d/c Ensure Enlive due to NPO status  NUTRITION DIAGNOSIS:   Inadequate oral intake related to inability to eat as evidenced by (NPO/CL until just now).  Ongoing, pt now NPO  GOAL:   Patient will meet greater than or equal to 90% of their needs  Unmet  MONITOR:   PO intake, Supplement acceptance, Diet advancement, Labs, Weight trends, I & O's  REASON FOR ASSESSMENT:   Malnutrition Screening Tool    ASSESSMENT:   72 year old female with PMH significant for CAD, HLD/HTN, CVA, T2DM, RA, and fibromyalgia/chronic pain. Pt presented w/ 2 weeks of lower abdominal pain. Had recently been dx w/ UTI and palced on abx, but pain continued. Initial CT scan showed diverticulitis w/o perf, but pt developed hypotension and repeat CT did show perforation. Surgery consulted.   10/26-10/31 - NPO 10/31 - advanced to clear liquid diet 11/1 - advanced to full liquid diet 11/5 - advanced to soft diet (had 2 meals) 11/6 - NPO per surgery for bowel rest  Follow-up CT of abdomen on 11/5 showed multiple fluid collections in the pelvis. Pt transferred to [facility] for drain placements by IR.  Noted plan for IR to place 2 percutaneous drains today. Surgery recommending bowel rest at this time.  Spoke with pt and pt's husband at bedside. Pt's husband reports that she has had 2 solid food meals since admission to [facility] and only ate 25% of each of those meals (chicken pot pie, meatballs and rice). Pt has also been able to tolerate a cup of fruit and has had 3 Ensure Enlive supplements total since admission.  Discussed possibility of initiating TPN with surgery PA. Surgery to consider initiation of TPN tomorrow if unable to advance diet. Will  follow along for diet advancement vs TPN.  Pt leaving for IR drain placements at end of RD visit.  Medications reviewed and include: Lasix 20 mg BID, Protonix 40 mg daily, IV antibiotics  Labs reviewed: hemoglobin 9.3 (L) CBG's: 103, 79, 82, 94, 97 x 24 hours  Diet Order:   Diet Order            Diet NPO time specified  Diet effective now              EDUCATION NEEDS:   No education needs have been identified at this time  Skin:  Skin Assessment: Reviewed RN Assessment  Last BM:  10/29   Height:   Ht Readings from Last 1 Encounters:  04/16/18 5' 5.5" (1.664 m)    Weight:   Wt Readings from Last 1 Encounters:  04/16/18 126 kg    Ideal Body Weight:  56.82 kg  BMI:  Body mass index is 45.52 kg/m.  Estimated Nutritional Needs:   Kcal:  1600-1800 (MSJ +/- 100)  Protein:  85-100g Pro (1.5-1.8g/kg ibw)  Fluid:  1.6-1.8 L fluid (48ml/kcal)    Gaynell Face, MS, RD, LDN Inpatient Clinical Dietitian Pager: [phone] Weekend/After Hours: [phone]

## 2018-04-16 NOTE — Consult Note (Signed)
[facility] Surgery Consult Note  [name] [facility] 12/11/1945  [id number].    Requesting MD: Phillips Climes Chief Complaint/Reason for Consult: diverticulitis  HPI:  [name] is a 72yo female PMH HTN, DM-II, CKD, RA, H/o CAD s/p CABG (on plavix, last dose 10/27), who was admitted to [facility] 10/26 with acute sigmoid diverticulitis without perforation or abscess. Initially felt to be improving on IV merrem, as patient had advanced to a solid food diet yesterday. However, patient was transferred to [facility] yesterday due to a follow up CT scan revealing worsening diverticulitis now with multiple abdominal fluid collections. IR is following and plans to place 2 percutaneous drains today.  States that she has been feeling bad for a few weeks. Abdominal pain was initially mild and intermittent, but on 10/26 it became constant and severe. Patient nearly passed out due to the pain. States that her first bout of diverticulitis was in the 1980's, most recent bout was 10-12 years ago. Last colonoscopy was 02/13/18 with Dr. Laural Golden in [place]; no polyps were found, and she had diverticulosis in the sigmoid colon, descending colon, at the splenic flexure and in the transverse colon.  -Abdominal surgical history: cholecystectomy -Blood thinning medications: Plavix (last dose 10/27) -Lives at home with husband -Typically ambulates without assistive device, but does have cane/walker/powerchair that she uses PRN  ROS: Review of Systems  Constitutional: Positive for malaise/fatigue.  HENT: Negative.   Eyes: Negative.   Respiratory: Negative.   Cardiovascular: Positive for leg swelling.  Gastrointestinal: Positive for abdominal pain, diarrhea and nausea. Negative for vomiting.  Genitourinary: Negative.   Musculoskeletal: Positive for back pain and joint pain.  Skin: Negative.   Neurological: Negative.    All systems reviewed and otherwise negative except for as above  Family History  Problem  Relation Age of Onset  . Hypertension Daughter   . Rheum arthritis Maternal Grandmother     Past Medical History:  Diagnosis Date  . Anginal pain [facility])    sees Dr Einar Gip.   . Arthritis    rheumatoid ..   . Diabetes mellitus without complication (HCC)    Metformin 2.3.2017  . Diverticulitis   . Dysrhythmia   . Fibromyalgia   . GERD (gastroesophageal reflux disease)   . High cholesterol   . Hypertension   . Liver disease 08- 2012   per Dr Dagmar Hait pt has enlarged liver  . Peripheral vascular disease (HCC)    legs  . Pneumonia 06/17/2016  . Sleep apnea    sleep study  oct 2012  . Stroke [facility]) 2011   [facility]      Past Surgical History:  Procedure Laterality Date  . [place]  . CHOLECYSTECTOMY  1996  . COLONOSCOPY N/A 02/13/2018   Procedure: COLONOSCOPY;  Surgeon: Rogene Houston, MD;  [facility];  Service: Endoscopy;  Laterality: N/A;  8:30  . CORONARY ARTERY BYPASS GRAFT  1986  . ESOPHAGEAL DILATION N/A 01/13/2018   Procedure: ESOPHAGEAL DILATION;  Surgeon: Rogene Houston, MD;  Location: [facility];  Service: Endoscopy;  Laterality: N/A;  . ESOPHAGOGASTRODUODENOSCOPY N/A 01/13/2018   Procedure: ESOPHAGOGASTRODUODENOSCOPY (EGD);  Surgeon: Rogene Houston, MD;  Location: [facility];  Service: Endoscopy;  Laterality: N/A;  . EYE SURGERY  2013   cat ext bilateral .  . EYE SURGERY  2002   laser  . FOREIGN BODY REMOVAL  02/13/2018   Procedure: FOREIGN BODY REMOVAL;  Surgeon: Rogene Houston, MD;  Location: [facility];  Service: Endoscopy;;  foreign body removal which appears to be food debri in a stem form removed from colon by DR. Rehman  . [place]- 2007   rod right leg - right wrist plate  . GAS/FLUID EXCHANGE  03/25/2012   Procedure: GAS/FLUID EXCHANGE;  Surgeon: Hayden Pedro, MD;  Location: [place];  Service: Ophthalmology;  Laterality: Right;  . PARS PLANA VITRECTOMY  03/25/2012   Procedure: PARS PLANA  VITRECTOMY WITH 25 GAUGE;  Surgeon: Hayden Pedro, MD;  Location: [place];  Service: Ophthalmology;  Laterality: Right;    Social History:  reports that she quit smoking about 33 years ago. Her smoking use included cigarettes. She has a 1.25 pack-year smoking history. She has never used smokeless tobacco. She reports that she drinks alcohol. She reports that she does not use drugs.  Allergies:  Allergies  Allergen Reactions  . Fentanyl Anaphylaxis and Shortness Of Breath  . Lactose Intolerance (Gi) Other (See Comments)    G.I. Upset  . Butrans [Buprenorphine] Rash and Other (See Comments)    Infected skin underneath application  . Penicillins Rash    Facial rash Has patient had a PCN reaction causing immediate rash, facial/tongue/throat swelling, SOB or lightheadedness with hypotension: Yes Has patient had a PCN reaction causing severe rash involving mucus membranes or skin necrosis: No Has patient had a PCN reaction that required hospitalization No Has patient had a PCN reaction occurring within the last 10 years: Yes If all of the above answers are "NO", then may proceed with Cephalosporin use.   . Simvastatin Rash    Medications Prior to Admission  Medication Sig Dispense Refill  . Blood Glucose Monitoring Suppl (FREESTYLE LITE) DEVI See admin instructions.  0  . ciprofloxacin (CIPRO) 500 MG/5ML (10%) suspension Take by mouth 2 (two) times daily.    . clopidogrel (PLAVIX) 75 MG tablet Take 1 tablet (75 mg total) by mouth daily with breakfast. 90 tablet 6  . Dexlansoprazole 30 MG capsule Take 40 mg by mouth daily.    . diclofenac sodium (VOLTAREN) 1 % GEL Apply 2 g topically 4 (four) times daily as needed (Pain).     Marland Kitchen glipiZIDE (GLUCOTROL) 10 MG tablet Take 10 mg by mouth daily before breakfast.    . HYDROcodone-acetaminophen (NORCO) 10-325 MG tablet Take 1 tablet by mouth 4 (four) times daily.    . IRON PO Take 1 tablet by mouth daily.    . isosorbide dinitrate (ISORDIL) 5 MG  tablet Take 1 tablet (5 mg total) by mouth 3 (three) times daily before meals. 90 tablet 1  . leflunomide (ARAVA) 20 MG tablet Take 20 mg by mouth daily.    . metoprolol succinate (TOPROL-XL) 25 MG 24 hr tablet Take 25 mg by mouth daily.    . metroNIDAZOLE (FLAGYL) 250 MG tablet Take 250 mg by mouth 3 (three) times daily.    . Olmesartan-amLODIPine-HCTZ (TRIBENZOR) 40-5-25 MG TABS Take 1 tablet by mouth daily.     . pravastatin (PRAVACHOL) 40 MG tablet Take 40 mg by mouth at bedtime.    . predniSONE (DELTASONE) 5 MG tablet Take 5 mg by mouth daily with breakfast.    . pregabalin (LYRICA) 75 MG capsule Take 75 mg by mouth 3 (three) times daily.    . Tafluprost (ZIOPTAN) 0.0015 % SOLN Place 1 drop into both eyes at bedtime.     . temazepam (RESTORIL) 30 MG capsule Take 30 mg by mouth at  bedtime.    . timolol (BETIMOL) 0.5 % ophthalmic solution Place 1 drop into both eyes 2 (two) times daily.     . Tofacitinib Citrate (XELJANZ) 5 MG TABS Take 1 tablet by mouth daily.    Marland Kitchen VITAMIN D, ERGOCALCIFEROL, PO Take 50,000 Units by mouth once a week. Thursdays    . docusate sodium (COLACE) 100 MG capsule Take 2 capsules (200 mg total) by mouth at bedtime. (Patient taking differently: Take 200 mg by mouth as needed. ) 10 capsule 0    Prior to Admission medications   Medication Sig Start Date End Date Taking? Authorizing Provider  Blood Glucose Monitoring Suppl (FREESTYLE LITE) DEVI See admin instructions. 12/22/14  Yes [provider]  ciprofloxacin (CIPRO) 500 MG/5ML (10%) suspension Take by mouth 2 (two) times daily.   Yes [provider]  clopidogrel (PLAVIX) 75 MG tablet Take 1 tablet (75 mg total) by mouth daily with breakfast. 01/14/18  Yes Rehman, Mechele Dawley, MD  Dexlansoprazole 30 MG capsule Take 40 mg by mouth daily.   Yes [provider]  diclofenac sodium (VOLTAREN) 1 % GEL Apply 2 g topically 4 (four) times daily as needed (Pain).    Yes [provider]  glipiZIDE  (GLUCOTROL) 10 MG tablet Take 10 mg by mouth daily before breakfast.   Yes [provider]  HYDROcodone-acetaminophen (NORCO) 10-325 MG tablet Take 1 tablet by mouth 4 (four) times daily.   Yes [provider]  IRON PO Take 1 tablet by mouth daily.   Yes [provider]  isosorbide dinitrate (ISORDIL) 5 MG tablet Take 1 tablet (5 mg total) by mouth 3 (three) times daily before meals. 02/07/18  Yes Rehman, Mechele Dawley, MD  leflunomide (ARAVA) 20 MG tablet Take 20 mg by mouth daily.   Yes [provider]  metoprolol succinate (TOPROL-XL) 25 MG 24 hr tablet Take 25 mg by mouth daily.   Yes [provider]  metroNIDAZOLE (FLAGYL) 250 MG tablet Take 250 mg by mouth 3 (three) times daily.   Yes [provider]  Olmesartan-amLODIPine-HCTZ (TRIBENZOR) 40-5-25 MG TABS Take 1 tablet by mouth daily.    Yes [provider]  pravastatin (PRAVACHOL) 40 MG tablet Take 40 mg by mouth at bedtime.   Yes [provider]  predniSONE (DELTASONE) 5 MG tablet Take 5 mg by mouth daily with breakfast.   Yes [provider]  pregabalin (LYRICA) 75 MG capsule Take 75 mg by mouth 3 (three) times daily.   Yes [provider]  Tafluprost (ZIOPTAN) 0.0015 % SOLN Place 1 drop into both eyes at bedtime.    Yes [provider]  temazepam (RESTORIL) 30 MG capsule Take 30 mg by mouth at bedtime.   Yes [provider]  timolol (BETIMOL) 0.5 % ophthalmic solution Place 1 drop into both eyes 2 (two) times daily.    Yes [provider]  Tofacitinib Citrate (XELJANZ) 5 MG TABS Take 1 tablet by mouth daily.   Yes [provider]  VITAMIN D, ERGOCALCIFEROL, PO Take 50,000 Units by mouth once a week. Thursdays   Yes [provider]  docusate sodium (COLACE) 100 MG capsule Take 2 capsules (200 mg total) by mouth at bedtime. Patient taking differently: Take 200 mg by mouth as needed.  02/13/18   Rehman, Mechele Dawley, MD     Blood pressure (!) 129/58, pulse 93, temperature 98.3 F (36.8 C), resp. rate 19, height 5' 5.5" (1.664 m), weight 126 kg, SpO2 98 %.  Physical Exam: General: pleasant, WD/WN white female who is laying in bed in NAD HEENT: head is normocephalic, atraumatic.  Sclera are noninjected.  Pupils equal and round.  Ears and nose without any masses or lesions.  Mouth is pink and moist. Dentition fair Heart: regular, rate, and rhythm.  No obvious murmurs, gallops, or rubs noted.  Palpable pedal pulses bilaterally Lungs: CTAB, no wheezes, rhonchi, or rales noted.  Respiratory effort nonlabored Abd: obese, soft, ND, +BS, no masses, hernias, or organomegaly. Moderate TTP LLQ and suprapubic regions without peritonitis MS: no gross deformities. Edema noted to BUE/BLE Skin: warm and dry with no masses, lesions, or rashes Psych: A&Ox3 with an appropriate affect. Neuro: cranial nerves grossly intact, extremity CSM intact bilaterally, normal speech  Results for orders placed or performed during the hospital encounter of 04/05/18 (from the past 48 hour(s))  Glucose, capillary     Status: None   Collection Time: 04/14/18 11:44 AM  Result Value Ref Range   Glucose-Capillary 97 70 - 99 mg/dL   Comment 1 Notify RN    Comment 2 Document in Chart   Glucose, capillary     Status: Abnormal   Collection Time: 04/14/18  4:30 PM  Result Value Ref Range   Glucose-Capillary 107 (H) 70 - 99 mg/dL  Glucose, capillary     Status: None   Collection Time: 04/14/18  8:49 PM  Result Value Ref Range   Glucose-Capillary 87 70 - 99 mg/dL  Glucose, capillary     Status: None   Collection Time: 04/15/18 12:30 AM  Result Value Ref Range   Glucose-Capillary 98 70 - 99 mg/dL  Glucose, capillary     Status: None   Collection Time: 04/15/18  4:19 AM  Result Value Ref Range   Glucose-Capillary 97 70 - 99 mg/dL   Comment 1 Notify RN    Comment 2 Document in Chart   Glucose, capillary     Status: None   Collection Time:  04/15/18  7:52 AM  Result Value Ref Range   Glucose-Capillary 88 70 - 99 mg/dL  CBC     Status: Abnormal   Collection Time: 04/15/18  8:59 AM  Result Value Ref Range   WBC 8.9 4.0 - 10.5 K/uL   RBC 3.40 (L) 3.87 - 5.11 MIL/uL   Hemoglobin 10.1 (L) 12.0 - 15.0 g/dL   HCT 31.3 (L) 36.0 - 46.0 %   MCV 92.1 80.0 - 100.0 fL   MCH 29.7 26.0 - 34.0 pg   MCHC 32.3 30.0 - 36.0 g/dL   RDW 17.0 (H) 11.5 - 15.5 %   Platelets 205 150 - 400 K/uL   nRBC 0.0 0.0 - 0.2 %    Comment: Performed at [facility], [address] [place], [place] [address]  Comprehensive metabolic panel     Status: Abnormal   Collection Time: 04/15/18  8:59 AM  Result Value Ref Range   Sodium 139 135 - 145 mmol/L   Potassium 3.6 3.5 - 5.1 mmol/L   Chloride 104 98 - 111 mmol/L   CO2 26 22 - 32 mmol/L   Glucose, Bld 103 (H) 70 - 99 mg/dL   BUN 22 8 - 23 mg/dL   Creatinine, Ser 0.83 0.44 - 1.00 mg/dL   Calcium 8.3 (L) 8.9 - 10.3 mg/dL   Total Protein 5.6 (L) 6.5 - 8.1 g/dL   Albumin 2.1 (L) 3.5 - 5.0 g/dL   AST 69 (H) 15 - 41 U/L   ALT 43 0 -  44 U/L   Alkaline Phosphatase 63 38 - 126 U/L   Total Bilirubin 1.0 0.3 - 1.2 mg/dL   GFR calc non Af Amer >60 >60 mL/min   GFR calc Af Amer >60 >60 mL/min    Comment: (NOTE) The eGFR has been calculated using the CKD EPI equation. This calculation has not been validated in all clinical situations. eGFR's persistently <60 mL/min signify possible Chronic Kidney Disease.    Anion gap 9 5 - 15    Comment: Performed at [facility], [address], [place], [place] [address]  Glucose, capillary     Status: None   Collection Time: 04/15/18 11:03 AM  Result Value Ref Range   Glucose-Capillary 86 70 - 99 mg/dL  Glucose, capillary     Status: None   Collection Time: 04/15/18  4:06 PM  Result Value Ref Range   Glucose-Capillary 97 70 - 99 mg/dL   Comment 1 Notify RN    Comment 2 Document in Chart   Glucose, capillary     Status: None   Collection Time: 04/15/18 10:08 PM   Result Value Ref Range   Glucose-Capillary 94 70 - 99 mg/dL  Glucose, capillary     Status: None   Collection Time: 04/16/18  1:47 AM  Result Value Ref Range   Glucose-Capillary 82 70 - 99 mg/dL  Glucose, capillary     Status: None   Collection Time: 04/16/18  4:07 AM  Result Value Ref Range   Glucose-Capillary 79 70 - 99 mg/dL  Heparin level (unfractionated)     Status: None   Collection Time: 04/16/18  4:23 AM  Result Value Ref Range   Heparin Unfractionated 0.51 0.30 - 0.70 IU/mL    Comment: (NOTE) If heparin results are below expected values, and patient dosage has  been confirmed, suggest follow up testing of antithrombin III levels. Performed at [facility], [place] [address] [place], [place] [address]   CBC     Status: Abnormal   Collection Time: 04/16/18  4:23 AM  Result Value Ref Range   WBC 8.4 4.0 - 10.5 K/uL   RBC 3.13 (L) 3.87 - 5.11 MIL/uL   Hemoglobin 9.3 (L) 12.0 - 15.0 g/dL   HCT 28.2 (L) 36.0 - 46.0 %   MCV 90.1 80.0 - 100.0 fL   MCH 29.7 26.0 - 34.0 pg   MCHC 33.0 30.0 - 36.0 g/dL   RDW 16.5 (H) 11.5 - 15.5 %   Platelets 172 150 - 400 K/uL   nRBC 0.0 0.0 - 0.2 %    Comment: Performed at [facility], [place] [address] [place], [place] [address]  Glucose, capillary     Status: Abnormal   Collection Time: 04/16/18  8:04 AM  Result Value Ref Range   Glucose-Capillary 103 (H) 70 - 99 mg/dL   Ct Abdomen Pelvis Wo Contrast  Result Date: 04/15/2018 CLINICAL DATA:  Acute lower abdominal pain. EXAM: CT ABDOMEN AND PELVIS WITHOUT CONTRAST TECHNIQUE: Multidetector CT imaging of the abdomen and pelvis was performed following the standard protocol without IV contrast. COMPARISON:  CT scan of April 06, 2018. FINDINGS: Lower chest: No acute abnormality. Hepatobiliary: No focal liver abnormality is seen. Status post cholecystectomy. No biliary dilatation. Pancreas: Unremarkable. No pancreatic ductal dilatation or surrounding inflammatory changes. Spleen:  Normal in size without focal abnormality. Adrenals/Urinary Tract: Adrenal glands appear normal. Small nonobstructive right renal calculus is noted. No hydronephrosis or renal obstruction is noted. Urinary bladder is unremarkable. Stomach/Bowel:  The stomach and appendix are unremarkable. Sigmoid diverticulosis is noted. Some degree of residual diverticulitis cannot be excluded. However, 7.3 x 5.8 cm fluid collection is noted in left lower quadrant, which communicates with smaller fluid collection measuring 5.1 x 3.6 cm. This is concerning for abscess status post previous perforated diverticulitis. Air-fluid collection measuring 8.2 x 5.9 cm is seen in the right lower quadrant also concerning for possible abscess. 8.9 x 5.3 cm fluid and stool collection is seen posterior to the uterus and anterior to the rectum concerning for abscess or contained perforation. Vascular/Lymphatic: Aortic atherosclerosis. No enlarged abdominal or pelvic lymph nodes. Reproductive: Uterus and bilateral adnexa are unremarkable. Other: Stable 6.9 x 4.5 cm fluid collection in left inguinal region. This most likely is benign. Musculoskeletal: No acute or significant osseous findings. IMPRESSION: There are at least 3 fluid collections and probable abscesses seen in the lower abdomen and pelvis, following previously perforated diverticulitis. 7.3 x 5.8 cm fluid collection is noted in left lower quadrant, as well as air-fluid collection measuring 8.2 x 5.9 cm seen in the right lower quadrant. Also noted is 8.9 x 5.3 cm fluid collection in the pelvis in the pre rectal space which appears to contain stool and fluid. These results will be called to the ordering clinician or representative by the Radiologist Assistant, and communication documented in the PACS or zVision Dashboard. Small nonobstructive right renal calculus. Aortic Atherosclerosis (ICD10-I70.0). Electronically Signed   By: Marijo Conception, M.D.   On: 04/15/2018 14:10   US Venous Img  Upper Uni Right  Result Date: 04/15/2018 CLINICAL DATA:  None EXAM: RIGHT UPPER EXTREMITY VENOUS DOPPLER ULTRASOUND TECHNIQUE: Gray-scale sonography with graded compression, as well as color Doppler and duplex ultrasound were performed to evaluate the upper extremity deep venous system from the level of the subclavian vein and including the jugular, axillary, basilic, radial, ulnar and upper cephalic vein. Spectral Doppler was utilized to evaluate flow at rest and with distal augmentation maneuvers. COMPARISON:  None. FINDINGS: Contralateral Subclavian Vein: Respiratory phasicity is normal and symmetric with the symptomatic side. No evidence of thrombus. Normal compressibility. Internal Jugular Vein: No evidence of thrombus. Normal compressibility, respiratory phasicity and response to augmentation. Subclavian Vein: No evidence of thrombus. Normal compressibility, respiratory phasicity and response to augmentation. Axillary Vein: No evidence of thrombus. Normal compressibility, respiratory phasicity and response to augmentation. Cephalic Vein: No evidence of thrombus. Normal compressibility, respiratory phasicity and response to augmentation. Basilic Vein: No evidence of thrombus. Normal compressibility, respiratory phasicity and response to augmentation. Brachial Veins: Occlusive thrombus of the right brachial vein, paired brachial veins. Radial Veins: No evidence of thrombus. Normal compressibility, respiratory phasicity and response to augmentation. Ulnar Veins: No evidence of thrombus. Normal compressibility, respiratory phasicity and response to augmentation. Other Findings:  Edema IMPRESSION: Sonographic survey of the right upper extremity is positive for occlusive DVT of the paired right brachial vein. Electronically Signed   By: Corrie Mckusick D.O.   On: 04/15/2018 14:17   Anti-infectives (From admission, onward)   Start     Dose/Rate Route Frequency Ordered Stop   04/15/18 1700  meropenem (MERREM) 1 g  in sodium chloride 0.9 % 100 mL IVPB     1 g 200 mL/hr over 30 Minutes Intravenous Every 8 hours 04/15/18 1534     04/09/18 2200  meropenem (MERREM) 1 g in sodium chloride 0.9 % 100 mL IVPB  Status:  Discontinued     1 g 200 mL/hr over 30 Minutes Intravenous Every 8  hours 04/09/18 1244 04/14/18 0944   04/06/18 1300  meropenem (MERREM) 1 g in sodium chloride 0.9 % 100 mL IVPB  Status:  Discontinued     1 g 200 mL/hr over 30 Minutes Intravenous Every 12 hours 04/06/18 1145 04/09/18 1244   04/06/18 1145  meropenem (MERREM) 1 g in sodium chloride 0.9 % 100 mL IVPB  Status:  Discontinued     1 g 200 mL/hr over 30 Minutes Intravenous Every 8 hours 04/06/18 1139 04/06/18 1145   04/06/18 0900  ciprofloxacin (CIPRO) IVPB 400 mg  Status:  Discontinued     400 mg 200 mL/hr over 60 Minutes Intravenous Every 12 hours 04/06/18 0732 04/06/18 1139   04/06/18 0300  metroNIDAZOLE (FLAGYL) IVPB 500 mg  Status:  Discontinued     500 mg 100 mL/hr over 60 Minutes Intravenous Every 8 hours 04/05/18 2335 04/06/18 1139   04/05/18 1930  ciprofloxacin (CIPRO) IVPB 400 mg     400 mg 200 mL/hr over 60 Minutes Intravenous  Once 04/05/18 1925 04/05/18 2217   04/05/18 1930  metroNIDAZOLE (FLAGYL) IVPB 500 mg     500 mg 100 mL/hr over 60 Minutes Intravenous  Once 04/05/18 1925 04/05/18 2102        Assessment/Plan HTN HLD 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 - on plavix (last dose 10/27) H/o CVA  Morbidly obese Chronic pain RUE DVT - on IV heparin  Acute perforated sigmoid diverticulitis with multiple abscesses - CT scan 11/5 reveals at least 3 fluid collections in the lower abdomen/pelvis with previous perforated diverticulitis; fluid collections are 7.3 x 5.8 cm in left lower quadrant, air-fluid collection measuring 8.2 x 5.9 cm in the right lower quadrant, and 8.9 x 5.3 cm fluid collection in the pelvis in the pre rectal space which appears to contain stool and fluid - IR to place percutaneous drains  today - Hopefully this will resolve without surgical intervention. Recommend continue IV antibiotics and bowel rest. Will continue to follow.  ID - currently merrem 10/27>> VTE - SCDs, IV heparin FEN - IVF, NPO Foley - none Follow up - TBD  Wellington Hampshire, [facility] 04/16/2018, 9:39 AM Pager: [phone] Mon 7:00 am -11:30 AM Tues-Fri 7:00 am-4:30 pm Sat-Sun 7:00 am-11:30 am

## 2018-04-17 LAB — BASIC METABOLIC PANEL
Anion gap: 9 (ref 5–15)
BUN: 21 mg/dL (ref 8–23)
CHLORIDE: 100 mmol/L (ref 98–111)
CO2: 28 mmol/L (ref 22–32)
CREATININE: 1.08 mg/dL — AB (ref 0.44–1.00)
Calcium: 7.5 mg/dL — ABNORMAL LOW (ref 8.9–10.3)
GFR, EST AFRICAN AMERICAN: 58 mL/min — AB (ref >60)
GFR, EST NON AFRICAN AMERICAN: 50 mL/min — AB (ref >60)
Glucose, Bld: 78 mg/dL (ref 70–99)
POTASSIUM: 3.1 mmol/L — AB (ref 3.5–5.1)
SODIUM: 137 mmol/L (ref 135–145)

## 2018-04-17 LAB — CBC
HCT: 27.3 % — ABNORMAL LOW (ref 36.0–46.0)
HEMOGLOBIN: 8.7 g/dL — AB (ref 12.0–15.0)
MCH: 28.7 pg (ref 26.0–34.0)
MCHC: 31.9 g/dL (ref 30.0–36.0)
MCV: 90.1 fL (ref 80.0–100.0)
PLATELETS: 188 10*3/uL (ref 150–400)
RBC: 3.03 MIL/uL — AB (ref 3.87–5.11)
RDW: 16.5 % — ABNORMAL HIGH (ref 11.5–15.5)
WBC: 7.4 10*3/uL (ref 4.0–10.5)
nRBC: 0 % (ref 0.0–0.2)

## 2018-04-17 LAB — GLUCOSE, CAPILLARY
GLUCOSE-CAPILLARY: 116 mg/dL — AB (ref 70–99)
GLUCOSE-CAPILLARY: 70 mg/dL (ref 70–99)
GLUCOSE-CAPILLARY: 78 mg/dL (ref 70–99)
Glucose-Capillary: 93 mg/dL (ref 70–99)
Glucose-Capillary: 106 mg/dL — ABNORMAL HIGH (ref 70–99)
Glucose-Capillary: 96 mg/dL (ref 70–99)

## 2018-04-17 LAB — HEPARIN LEVEL (UNFRACTIONATED)
HEPARIN UNFRACTIONATED: 0.4 [IU]/mL (ref 0.30–0.70)
HEPARIN UNFRACTIONATED: 0.33 [IU]/mL (ref 0.30–0.70)

## 2018-04-17 MED ORDER — SODIUM CHLORIDE 0.9 % IV BOLUS
500.0000 mL | Freq: Once | INTRAVENOUS | Status: AC
  Administered 2018-04-17: 500 mL via INTRAVENOUS

## 2018-04-17 MED ORDER — ESCITALOPRAM OXALATE 10 MG PO TABS
10.0000 mg | ORAL_TABLET | Freq: Every day | ORAL | Status: DC
  Administered 2018-04-17 – 2018-04-20 (×4): 10 mg via ORAL
  Filled 2018-04-17 (×4): qty 1

## 2018-04-17 MED ORDER — NYSTATIN 100000 UNIT/GM EX POWD
Freq: Three times a day (TID) | CUTANEOUS | Status: DC
  Administered 2018-04-17 – 2018-04-23 (×17): via TOPICAL
  Administered 2018-04-23 (×2): 1 g via TOPICAL
  Administered 2018-04-24 – 2018-04-28 (×12): via TOPICAL
  Administered 2018-04-28: 1 g via TOPICAL
  Administered 2018-04-29 – 2018-05-13 (×37): via TOPICAL
  Filled 2018-04-17: qty 15

## 2018-04-17 MED ORDER — POTASSIUM CHLORIDE CRYS ER 20 MEQ PO TBCR
40.0000 meq | EXTENDED_RELEASE_TABLET | ORAL | Status: AC
  Administered 2018-04-17 (×2): 40 meq via ORAL
  Filled 2018-04-17 (×2): qty 2

## 2018-04-17 MED ORDER — FUROSEMIDE 10 MG/ML IJ SOLN
40.0000 mg | Freq: Every day | INTRAMUSCULAR | Status: DC
  Administered 2018-04-17 – 2018-04-23 (×7): 40 mg via INTRAVENOUS
  Filled 2018-04-17 (×7): qty 4

## 2018-04-17 MED ORDER — DEXTROSE-NACL 5-0.45 % IV SOLN
INTRAVENOUS | Status: DC
  Administered 2018-04-17: 06:00:00 via INTRAVENOUS

## 2018-04-17 NOTE — Progress Notes (Signed)
Subjective/Chief Complaint: States she feels no better, just over-all down. "I can't get out of the bed." has had dome diarrhea and is embarrassed about having to be cleaned up by staff.   Objective: Vital signs in last 24 hours: Temp:  [98.1 F (36.7 C)-99.5 F (37.5 C)] 98.2 F (36.8 C) (11/07 0406) Pulse Rate:  [83-132] 90 (11/07 0406) Resp:  [16-30] 18 (11/06 2220) BP: (91-141)/(50-95) 141/64 (11/07 0406) SpO2:  [91 %-100 %] 94 % (11/07 0406) Last BM Date: 04/15/18  Intake/Output from previous day: 11/06 0701 - 11/07 0700 In: 360.3 [I.V.:250.3; IV Piggyback:100] Out: 150 [Drains:150] Intake/Output this shift: No intake/output data recorded.  General appearance: alert, cooperative and chronicall ill appearing Resp: unlabored respirations GI: obese, tender around drains Skin: Skin color, texture, turgor normal. No rashes or lesions drain output thin purulent   Lab Results:  Recent Labs    04/16/18 0423 04/17/18 0443  WBC 8.4 7.4  HGB 9.3* 8.7*  HCT 28.2* 27.3*  PLT 172 188   BMET Recent Labs    04/15/18 0859 04/17/18 0443  NA 139 137  K 3.6 3.1*  CL 104 100  CO2 26 28  GLUCOSE 103* 78  BUN 22 21  CREATININE 0.83 1.08*  CALCIUM 8.3* 7.5*   PT/INR No results for input(s): LABPROT, INR in the last 72 hours. ABG No results for input(s): PHART, HCO3 in the last 72 hours.  Invalid input(s): PCO2, PO2  Studies/Results: Ct Abdomen Pelvis Wo Contrast  Result Date: 04/15/2018 CLINICAL DATA:  Acute lower abdominal pain. EXAM: CT ABDOMEN AND PELVIS WITHOUT CONTRAST TECHNIQUE: Multidetector CT imaging of the abdomen and pelvis was performed following the standard protocol without IV contrast. COMPARISON:  CT scan of April 06, 2018. FINDINGS: Lower chest: No acute abnormality. Hepatobiliary: No focal liver abnormality is seen. Status post cholecystectomy. No biliary dilatation. Pancreas: Unremarkable. No pancreatic ductal dilatation or surrounding  inflammatory changes. Spleen: Normal in size without focal abnormality. Adrenals/Urinary Tract: Adrenal glands appear normal. Small nonobstructive right renal calculus is noted. No hydronephrosis or renal obstruction is noted. Urinary bladder is unremarkable. Stomach/Bowel: The stomach and appendix are unremarkable. Sigmoid diverticulosis is noted. Some degree of residual diverticulitis cannot be excluded. However, 7.3 x 5.8 cm fluid collection is noted in left lower quadrant, which communicates with smaller fluid collection measuring 5.1 x 3.6 cm. This is concerning for abscess status post previous perforated diverticulitis. Air-fluid collection measuring 8.2 x 5.9 cm is seen in the right lower quadrant also concerning for possible abscess. 8.9 x 5.3 cm fluid and stool collection is seen posterior to the uterus and anterior to the rectum concerning for abscess or contained perforation. Vascular/Lymphatic: Aortic atherosclerosis. No enlarged abdominal or pelvic lymph nodes. Reproductive: Uterus and bilateral adnexa are unremarkable. Other: Stable 6.9 x 4.5 cm fluid collection in left inguinal region. This most likely is benign. Musculoskeletal: No acute or significant osseous findings. IMPRESSION: There are at least 3 fluid collections and probable abscesses seen in the lower abdomen and pelvis, following previously perforated diverticulitis. 7.3 x 5.8 cm fluid collection is noted in left lower quadrant, as well as air-fluid collection measuring 8.2 x 5.9 cm seen in the right lower quadrant. Also noted is 8.9 x 5.3 cm fluid collection in the pelvis in the pre rectal space which appears to contain stool and fluid. These results will be called to the ordering clinician or representative by the Radiologist Assistant, and communication documented in the PACS or zVision Dashboard. Small nonobstructive right  renal calculus. Aortic Atherosclerosis (ICD10-I70.0). Electronically Signed   By: Marijo Conception, M.D.   On:  04/15/2018 14:10   US Venous Img Upper Uni Right  Result Date: 04/15/2018 CLINICAL DATA:  None EXAM: RIGHT UPPER EXTREMITY VENOUS DOPPLER ULTRASOUND TECHNIQUE: Gray-scale sonography with graded compression, as well as color Doppler and duplex ultrasound were performed to evaluate the upper extremity deep venous system from the level of the subclavian vein and including the jugular, axillary, basilic, radial, ulnar and upper cephalic vein. Spectral Doppler was utilized to evaluate flow at rest and with distal augmentation maneuvers. COMPARISON:  None. FINDINGS: Contralateral Subclavian Vein: Respiratory phasicity is normal and symmetric with the symptomatic side. No evidence of thrombus. Normal compressibility. Internal Jugular Vein: No evidence of thrombus. Normal compressibility, respiratory phasicity and response to augmentation. Subclavian Vein: No evidence of thrombus. Normal compressibility, respiratory phasicity and response to augmentation. Axillary Vein: No evidence of thrombus. Normal compressibility, respiratory phasicity and response to augmentation. Cephalic Vein: No evidence of thrombus. Normal compressibility, respiratory phasicity and response to augmentation. Basilic Vein: No evidence of thrombus. Normal compressibility, respiratory phasicity and response to augmentation. Brachial Veins: Occlusive thrombus of the right brachial vein, paired brachial veins. Radial Veins: No evidence of thrombus. Normal compressibility, respiratory phasicity and response to augmentation. Ulnar Veins: No evidence of thrombus. Normal compressibility, respiratory phasicity and response to augmentation. Other Findings:  Edema IMPRESSION: Sonographic survey of the right upper extremity is positive for occlusive DVT of the paired right brachial vein. Electronically Signed   By: Corrie Mckusick D.O.   On: 04/15/2018 14:17   Ct Image Guided Fluid Drain By Catheter  Result Date: 04/16/2018 INDICATION: Pelvic abscess x2  EXAM: PELVIC ABSCESS DRAIN X2 CT-GUIDED MEDICATIONS: The patient is currently admitted to the hospital and receiving intravenous antibiotics. The antibiotics were administered within an appropriate time frame prior to the initiation of the procedure. ANESTHESIA/SEDATION: Fentanyl 0 mcg IV; Versed 4 mg IV, 4 mg morphine sulfate Moderate Sedation Time:  53 minutes The patient was continuously monitored during the procedure by the interventional radiology nurse under my direct supervision. COMPLICATIONS: None immediate. PROCEDURE: Informed written consent was obtained from the patient after a thorough discussion of the procedural risks, benefits and alternatives. All questions were addressed. Maximal Sterile Barrier Technique was utilized including caps, mask, sterile gowns, sterile gloves, sterile drape, hand hygiene and skin antiseptic. A timeout was performed prior to the initiation of the procedure. In the prone position, the right gluteal region was prepped and draped in a sterile fashion. Under CT guidance, an 18 gauge needle was advanced into the pelvic abscess and removed over an Amplatz wire. Ten [place] dilator followed by a 10 [place] drain were inserted in the fluid collection. Pus was aspirated. The patient was placed supine. The lower abdomen was prepped and draped in a sterile fashion. 1% lidocaine was utilized for local anesthesia. Under CT guidance, an 18 gauge needle was inserted into the upper pelvic fluid collection and removed over an Amplatz wire. Ten [place] dilator followed by 10 [place] drain were inserted. Cloudy yellow fluid was aspirated. FINDINGS: Images demonstrate 5 French drain placement into the 2 fluid collections described above. IMPRESSION: Successful pelvic abscess drain x2 Electronically Signed   By: Marybelle Killings M.D.   On: 04/16/2018 16:01    Anti-infectives: Anti-infectives (From admission, onward)   Start     Dose/Rate Route Frequency Ordered Stop   04/15/18 1700  meropenem  (MERREM) 1 g in sodium chloride 0.9 %  100 mL IVPB     1 g 200 mL/hr over 30 Minutes Intravenous Every 8 hours 04/15/18 1534     04/09/18 2200  meropenem (MERREM) 1 g in sodium chloride 0.9 % 100 mL IVPB  Status:  Discontinued     1 g 200 mL/hr over 30 Minutes Intravenous Every 8 hours 04/09/18 1244 04/14/18 0944   04/06/18 1300  meropenem (MERREM) 1 g in sodium chloride 0.9 % 100 mL IVPB  Status:  Discontinued     1 g 200 mL/hr over 30 Minutes Intravenous Every 12 hours 04/06/18 1145 04/09/18 1244   04/06/18 1145  meropenem (MERREM) 1 g in sodium chloride 0.9 % 100 mL IVPB  Status:  Discontinued     1 g 200 mL/hr over 30 Minutes Intravenous Every 8 hours 04/06/18 1139 04/06/18 1145   04/06/18 0900  ciprofloxacin (CIPRO) IVPB 400 mg  Status:  Discontinued     400 mg 200 mL/hr over 60 Minutes Intravenous Every 12 hours 04/06/18 0732 04/06/18 1139   04/06/18 0300  metroNIDAZOLE (FLAGYL) IVPB 500 mg  Status:  Discontinued     500 mg 100 mL/hr over 60 Minutes Intravenous Every 8 hours 04/05/18 2335 04/06/18 1139   04/05/18 1930  ciprofloxacin (CIPRO) IVPB 400 mg     400 mg 200 mL/hr over 60 Minutes Intravenous  Once 04/05/18 1925 04/05/18 2217   04/05/18 1930  metroNIDAZOLE (FLAGYL) IVPB 500 mg     500 mg 100 mL/hr over 60 Minutes Intravenous  Once 04/05/18 1925 04/05/18 2102      Assessment/Plan HTN HLD 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 - on plavix (last dose 10/27) H/o CVA  Morbidly obese Chronic pain RUE DVT - on IV heparin  Acute perforated sigmoid diverticulitis with multiple abscesses - CT scan 11/5 reveals at least 3 fluid collections in the lower abdomen/pelvis with previous perforated diverticulitis; fluid collections are 7.3 x 5.8 cm in left lower quadrant, air-fluid collection measuring 8.2 x 5.9 cm in the right lower quadrant, and 8.9 x 5.3 cm fluid collection in the pelvis in the pre rectal space which appears to contain stool and fluid - s/p two perc drains 11/6 -  Hopefully this will resolve without surgical intervention. Recommend continue IV antibiotics. Will continue to follow.  ID - currently merrem 10/27>> VTE - SCDs, IV heparin FEN - IVF, advance to clears today, supplement with boost/ breeze Foley - she does have a wick, urine output has not been recorded. Follow up - TBD  Patient needs to mobilize. There is no reason she should need a wick or be unable to get out of the bed if she was able to do so prior to admission. PT already consulted.    LOS: 10 days    Clovis Riley 04/17/2018

## 2018-04-17 NOTE — Progress Notes (Signed)
Patient Demographics:    [name], is a 72 y.o. female, DOB - [birth year], WHQ:[id number]  Admit date - 04/05/2018   Admitting Physician [name], MD  Outpatient Primary MD for the patient is [name], MD  LOS - 10   Chief Complaint  Patient presents with  . Abdominal Pain      Brief narrative:  72yo female PMH HTN, DM-II, CKD, RA, H/o CAD s/p CABG (on plavix,), who was admitted to [facility] 10/26 with acute sigmoid diverticulitis without perforation or abscess. Initially felt to be improving on IV merrem, but she continued to have abdominal pain, CT abdomen pelvis was obtained 04/15/2018, was significant for worsening diverticulitis now with multiple abdominal fluid collections.  Patient developed acute right upper extremity DVT in the setting of PICC line, she was transferred from [facility] to [place] [facility] 04/15/2018 for evaluation by IR.   Subjective:    [name] denies any fever or chills overnight, reports abdominal pain at baseline, reports minimal diarrhea.  She did have some hypoglycemia overnight as she was n.p.o..   Assessment  & P lan :    Principal Problem:   Diverticulitis Active Problems:   DM (diabetes mellitus) ([place])   HTN (hypertension)   Rheumatoid arthritis ([place])   Anemia   Renal insufficiency   Sepsis due to undetermined organism ([place])   CKD (chronic kidney disease) stage 3, GFR 30-59 ml/min (HCC)   Fibromyalgia   Diverticulitis large intestine w/o perforation or abscess w/o bleeding   Acute diverticulitis   Perforated sigmoid colon (HCC)   Acute renal failure superimposed on stage 3 chronic kidney disease ([place])   Sepsis due to acute perforated sigmoid diverticulitis- -sepsis criteria met on admission - imaging 04/06/2018 on admission significant for perforated sigmoid diverticulitis, was managed conservatively on IV meropenem, clear liquid diet, . -Repeat imaging  04/15/2018 with large abscesses in the abdomen, transferred to [facility] for further care, general surgery consult greatly appreciated. -IR consult appreciated, status post drainage x2, for her diverticular abscess, with with drain, Gram stain showing gram-positive cocci, gram-positive rods as well, she is on meropenem, I will with ID there is any indication to change antibiotics.  Acute right upper extremity DVT -In the setting of PICC line insertion, start on anticoagulation, continue with heparin for anticoagulation until no surgery or further procedure is indicated. -Will discontinue PICC line  acute kidney injury on CKD3. Baseline creat 1.2.   - AKI resolved w/ IVF but creat down to 0.7 now, appears to be volume overloaded, continue with diuresis as blood pressure tolerates.  Hypertension -Continue with metoprolol, given soft blood pressure continue to hold home medication including as  isosorbide/ olmesartan/amlodipine/HCTZ combo -Patient's blood pressure has improved, so I will start her on IV diuresis and hold with resuming her the rest of antihypertensive medication and monitor blood pressure   DM2 -   BS's are good, cont Novolog/Humalog Sliding scale insulin  H/o CAD sp CABG /hx TIA - previously on Plavix and pravastatin, continue to hold Plavix may need further procedures or surgery  Debility - needs to get OOB , PT seeing patient , SNF recommended, SW consulted   acute on chronic anemia--- sp 2 u prbc's, will repeat cbc today  Patient seems to be in a depressed mood today, I will start on low-dose Lexapro  Code Status : full  Disposition Plan  : TBD, will prob need SNF  Consults  :  Gen Surgery, IR  DVT Prophylaxis  :   SCDs, heparin GTT   Lab Results  Component Value Date   PLT 188 04/17/2018    Inpatient Medications  Scheduled Meds: . Chlorhexidine Gluconate Cloth  6 each Topical Daily  . escitalopram  10 mg Oral QHS  . feeding supplement (ENSURE ENLIVE)   237 mL Oral TID BM  . furosemide  20 mg Oral BID  . latanoprost  1 drop Both Eyes QHS  . metoprolol tartrate  25 mg Oral BID  .  morphine injection  2 mg Intravenous Once  . nystatin   Topical TID  . pantoprazole  40 mg Oral Daily  . pravastatin  40 mg Oral QHS  . sodium chloride flush  10-40 mL Intracatheter Q12H  . sodium chloride flush  5 mL Intracatheter Q8H  . timolol  1 drop Both Eyes BID   Continuous Infusions: . dextrose 5 % and 0.45% NaCl 125 mL/hr at 04/17/18 0949  . heparin 1,500 Units/hr (04/17/18 0202)  . meropenem (MERREM) IV 1 g (04/17/18 0955)   PRN Meds:.acetaminophen **OR** acetaminophen, hydrALAZINE, HYDROcodone-acetaminophen, ondansetron (ZOFRAN) IV, senna-docusate, sodium chloride flush    Anti-infectives (From admission, onward)   Start     Dose/Rate Route Frequency Ordered Stop   04/15/18 1700  meropenem (MERREM) 1 g in sodium chloride 0.9 % 100 mL IVPB     1 g 200 mL/hr over 30 Minutes Intravenous Every 8 hours 04/15/18 1534     04/09/18 2200  meropenem (MERREM) 1 g in sodium chloride 0.9 % 100 mL IVPB  Status:  Discontinued     1 g 200 mL/hr over 30 Minutes Intravenous Every 8 hours 04/09/18 1244 04/14/18 0944   04/06/18 1300  meropenem (MERREM) 1 g in sodium chloride 0.9 % 100 mL IVPB  Status:  Discontinued     1 g 200 mL/hr over 30 Minutes Intravenous Every 12 hours 04/06/18 1145 04/09/18 1244   04/06/18 1145  meropenem (MERREM) 1 g in sodium chloride 0.9 % 100 mL IVPB  Status:  Discontinued     1 g 200 mL/hr over 30 Minutes Intravenous Every 8 hours 04/06/18 1139 04/06/18 1145   04/06/18 0900  ciprofloxacin (CIPRO) IVPB 400 mg  Status:  Discontinued     400 mg 200 mL/hr over 60 Minutes Intravenous Every 12 hours 04/06/18 0732 04/06/18 1139   04/06/18 0300  metroNIDAZOLE (FLAGYL) IVPB 500 mg  Status:  Discontinued     500 mg 100 mL/hr over 60 Minutes Intravenous Every 8 hours 04/05/18 2335 04/06/18 1139   04/05/18 1930  ciprofloxacin (CIPRO) IVPB  400 mg     400 mg 200 mL/hr over 60 Minutes Intravenous  Once 04/05/18 1925 04/05/18 2217   04/05/18 1930  metroNIDAZOLE (FLAGYL) IVPB 500 mg     500 mg 100 mL/hr over 60 Minutes Intravenous  Once 04/05/18 1925 04/05/18 2102        Objective:   Vitals:   04/16/18 2220 04/17/18 0406 04/17/18 0939 04/17/18 1431  BP: (!) 109/53 (!) 141/64 138/71 (!) 147/61  Pulse: 94 90 96 95  Resp: 18   18  Temp: 99.5 F (37.5 C) 98.2 F (36.8 C)  98.9 F (37.2 C)  TempSrc: Oral   Oral  SpO2: 97% 94%  91%  Weight:      Height:        Wt Readings from Last 3 Encounters:  04/16/18 126 kg  02/13/18 116.1 kg  01/13/18 120.7 kg     Intake/Output Summary (Last 24 hours) at 04/17/2018 1549 Last data filed at 04/17/2018 1315 Gross per 24 hour  Intake 940.27 ml  Output 850 ml  Net 90.27 ml     Physical Exam  Awake Alert, Oriented X 3, No new F.N deficits, Normal affect Symmetrical Chest wall movement, Good air movement bilaterally, CTAB RRR,No Gallops,Rubs or new Murmurs, No Parasternal Heave +ve B.Sounds, Abd Soft, she does have some generalized tenderness, with 2 drains present one in the left lower abdomen, and the right is posteriorly, with some purulent discharge, no tenderness, No rebound - guarding or rigidity. No Cyanosis, Clubbing  No new Rash or bruise  , she has lower extremity edema, as well right upper extremity edema      Data Review:   Micro Results Recent Results (from the past 240 hour(s))  MRSA PCR Screening     Status: None   Collection Time: 04/07/18  9:08 PM  Result Value Ref Range Status   MRSA by PCR NEGATIVE NEGATIVE Final    Comment:        The GeneXpert MRSA Assay (FDA approved for NASAL specimens only), is one component of a comprehensive MRSA colonization surveillance program. It is not intended to diagnose MRSA infection nor to guide or monitor treatment for MRSA infections. Performed at [facility], [address], [place], [place] [address]     Aerobic/Anaerobic Culture (surgical/deep wound)     Status: None (Preliminary result)   Collection Time: 04/16/18  3:13 PM  Result Value Ref Range Status   Specimen Description PELVIS ABSCESS  Final   Special Requests NONE  Final   Gram Stain   Final    FEW WBC PRESENT,BOTH PMN AND MONONUCLEAR FEW GRAM POSITIVE COCCI IN CLUSTERS FEW GRAM POSITIVE RODS    Culture   Final    CULTURE REINCUBATED FOR BETTER GROWTH Performed at [facility], [place] [address] [place], [place] [address]    Report Status PENDING  Incomplete  Aerobic/Anaerobic Culture (surgical/deep wound)     Status: None (Preliminary result)   Collection Time: 04/16/18  3:13 PM  Result Value Ref Range Status   Specimen Description ABSCESS PELVIS  Final   Special Requests SAMPLE NO 2  Final   Gram Stain   Final    FEW WBC PRESENT,BOTH PMN AND MONONUCLEAR NO ORGANISMS SEEN    Culture   Final    NO GROWTH < 24 HOURS Performed at [facility], [place] [address], [place], [place] [address]    Report Status PENDING  Incomplete    Radiology Reports Ct Abdomen Pelvis Wo Contrast  Result Date: 04/15/2018 CLINICAL DATA:  Acute lower abdominal pain. EXAM: CT ABDOMEN AND PELVIS WITHOUT CONTRAST TECHNIQUE: Multidetector CT imaging of the abdomen and pelvis was performed following the standard protocol without IV contrast. COMPARISON:  CT scan of April 06, 2018. FINDINGS: Lower chest: No acute abnormality. Hepatobiliary: No focal liver abnormality is seen. Status post cholecystectomy. No biliary dilatation. Pancreas: Unremarkable. No pancreatic ductal dilatation or surrounding inflammatory changes. Spleen: Normal in size without focal abnormality. Adrenals/Urinary Tract: Adrenal glands appear normal. Small nonobstructive right renal calculus is noted. No hydronephrosis or renal obstruction is noted. Urinary bladder is unremarkable. Stomach/Bowel: The stomach and appendix are unremarkable. Sigmoid diverticulosis  is noted.  Some degree of residual diverticulitis cannot be excluded. However, 7.3 x 5.8 cm fluid collection is noted in left lower quadrant, which communicates with smaller fluid collection measuring 5.1 x 3.6 cm. This is concerning for abscess status post previous perforated diverticulitis. Air-fluid collection measuring 8.2 x 5.9 cm is seen in the right lower quadrant also concerning for possible abscess. 8.9 x 5.3 cm fluid and stool collection is seen posterior to the uterus and anterior to the rectum concerning for abscess or contained perforation. Vascular/Lymphatic: Aortic atherosclerosis. No enlarged abdominal or pelvic lymph nodes. Reproductive: Uterus and bilateral adnexa are unremarkable. Other: Stable 6.9 x 4.5 cm fluid collection in left inguinal region. This most likely is benign. Musculoskeletal: No acute or significant osseous findings. IMPRESSION: There are at least 3 fluid collections and probable abscesses seen in the lower abdomen and pelvis, following previously perforated diverticulitis. 7.3 x 5.8 cm fluid collection is noted in left lower quadrant, as well as air-fluid collection measuring 8.2 x 5.9 cm seen in the right lower quadrant. Also noted is 8.9 x 5.3 cm fluid collection in the pelvis in the pre rectal space which appears to contain stool and fluid. These results will be called to the ordering clinician or representative by the Radiologist Assistant, and communication documented in the PACS or zVision Dashboard. Small nonobstructive right renal calculus. Aortic Atherosclerosis (ICD10-I70.0). Electronically Signed   By: Marijo Conception, M.D.   On: 04/15/2018 14:10   Ct Abdomen Pelvis Wo Contrast  Result Date: 04/06/2018 CLINICAL DATA:  72 year old female with concern for perforated diverticulitis. EXAM: CT ABDOMEN AND PELVIS WITHOUT CONTRAST TECHNIQUE: Multidetector CT imaging of the abdomen and pelvis was performed following the standard protocol without IV contrast. COMPARISON:  Chest  radiograph dated 04/06/2018 and CT dated 04/05/2018 FINDINGS: Evaluation of this exam is limited in the absence of intravenous contrast. Lower chest: Diffuse interstitial coarsening and streaky atelectasis/scarring of the lung bases. There is mild cardiomegaly. There is hypoattenuation of the cardiac blood pool suggestive of a degree of anemia. Clinical correlation is recommended. There is pneumoperitoneum and small amount of free fluid within the pelvis. Hepatobiliary: Advanced fatty infiltration of the liver. No intrahepatic biliary ductal dilatation. Cholecystectomy. Pancreas: Unremarkable. No pancreatic ductal dilatation or surrounding inflammatory changes. Spleen: Normal in size without focal abnormality. Adrenals/Urinary Tract: The adrenal glands are unremarkable. There is no hydronephrosis on either side. Excreted contrast in the renal collecting systems bilaterally. The visualized ureters appear unremarkable. There is trabeculated appearance of the bladder wall likely related to chronic bladder dysfunction. Correlation with urinalysis recommended to exclude cystitis. Stomach/Bowel: There is extensive sigmoid diverticulosis. There is associated inflammatory changes and perforation of the sigmoid colon. There is increased size of the extraluminal air since the prior CT. No drainable fluid collection is noted at this time. There is no bowel obstruction. Normal appendix. Vascular/Lymphatic: There is advanced aortoiliac atherosclerotic disease. No portal venous gas. There is no adenopathy. Reproductive: The uterus is grossly unremarkable. Other: None Musculoskeletal: Degenerative changes of the spine. No acute osseous pathology. IMPRESSION: 1. Perforated sigmoid diverticulitis with increased size of the extraluminal air since the prior CT. No drainable fluid collection noted at this time. 2. No bowel obstruction. Normal appendix. 3. Advanced fatty infiltration of the liver. These results were called by telephone  at the time of interpretation on 04/06/2018 at 11:09 pm to nurse Hearn, who verbally acknowledged these results. Electronically Signed   By: Anner Crete M.D.   On: 04/06/2018 23:25  Dg Chest 2 View  Result Date: 04/06/2018 CLINICAL DATA:  Dyspnea, sepsis, pt listless and unable to cooperate EXAM: CHEST - 2 VIEW COMPARISON:  06/27/2016 and 06/18/2016. FINDINGS: On the lateral view, there is evidence of a small amount free intraperitoneal air under the left hemidiaphragm. Lung volumes are low. There is opacity at the bases that is likely due to atelectasis along with bronchovascular crowding. No convincing pneumonia and no evidence of pulmonary edema. 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 Cardiac silhouette is normal in size. No mediastinal hilar masses. No pleural effusion.  No pneumothorax. Skeletal structures are grossly intact. IMPRESSION: 1. Small amount of free intraperitoneal air. Recommend follow-up abdomen and pelvis CT with contrast for further assessment. 2. Somewhat limited study due to low lung volumes. Allowing for this, no acute cardiopulmonary disease. Electronically Signed   By: Lajean Manes M.D.   On: 04/06/2018 11:23   Ct Abdomen Pelvis W Contrast  Result Date: 04/05/2018 CLINICAL DATA:  Patient has bilateral lower abdominal sharp intermittent pain with nausea without vomiting. H/x diverticulitis patient states pain and symptoms similar to past. EXAM: CT ABDOMEN AND PELVIS WITH CONTRAST TECHNIQUE: Multidetector CT imaging of the abdomen and pelvis was performed using the standard protocol following bolus administration of intravenous contrast. CONTRAST:  51m ISOVUE-300 IOPAMIDOL (ISOVUE-300) INJECTION 61% COMPARISON:  02/06/2018 FINDINGS: Lower chest: There is scarring in both lung bases. Coronary artery calcifications are present. The heart is normal in size. Hepatobiliary: Liver is diffusely low attenuation. No focal liver lesions. Cholecystectomy. Pancreas: Unremarkable. No  pancreatic ductal dilatation or surrounding inflammatory changes. Spleen: Normal in size without focal abnormality. Adrenals/Urinary Tract: Adrenal glands are normal. There is symmetric enhancement and excretion from the kidneys. 1 millimeter intrarenal calcification identified in the LOWER pole of the RIGHT kidney, not associated with obstruction. Ureters are unremarkable. The bladder and visualized portion of the urethra are normal. Stomach/Bowel: The stomach is normal in appearance. Small bowel loops are normal in caliber and wall thickness. The appendix is well seen and has a normal appearance. There is significant inflammatory change in the region of sigmoid colon and associated numerous diverticula in this segment. Fluid extends in the sigmoid mesentery. The diseased segment of sigmoid is adjacent to the uterus and the urinary bladder. No evidence for abscess or perforation. Vascular/Lymphatic: There is atherosclerotic calcification of the abdominal aorta. There is normal vascular opacification of the celiac axis, superior mesenteric artery, and inferior mesenteric artery. Normal appearance of the portal venous system and inferior vena cava. Reproductive: The uterus is present and is surrounded by inflammatory changes from the sigmoid diverticulitis. No adnexal mass. Other: Trace amount of free pelvic fluid. Musculoskeletal: Incompletely imaged fluid attenuation collection within the LEFT hip abductors, measuring at least 7.3 x 4.8 centimeters. The appearance is stable since 2017. There are degenerative changes in the lumbar spine with 7 millimeters anterolisthesis of L4 on L5. Median sternotomy. IMPRESSION: 1. Acute sigmoid diverticulitis with significant fluid in the sigmoid mesentery. There is no evidence for perforation or abscess. 2. Coronary artery calcifications. 3. Hepatic steatosis.  Cholecystectomy. 4. Nonobstructing intrarenal calculus in the LOWER pole the RIGHT kidney. 5. Normal appendix. 6.   Aortic atherosclerosis. 7. Probably benign ganglion cyst within the LEFT hip abductors. Electronically Signed   By: ENolon NationsM.D.   On: 04/05/2018 22:06   UKoreaVenous Img Upper Uni Right  Result Date: 04/15/2018 CLINICAL DATA:  None EXAM: RIGHT UPPER EXTREMITY VENOUS DOPPLER ULTRASOUND TECHNIQUE: Gray-scale sonography with graded compression, as well  as color Doppler and duplex ultrasound were performed to evaluate the upper extremity deep venous system from the level of the subclavian vein and including the jugular, axillary, basilic, radial, ulnar and upper cephalic vein. Spectral Doppler was utilized to evaluate flow at rest and with distal augmentation maneuvers. COMPARISON:  None. FINDINGS: Contralateral Subclavian Vein: Respiratory phasicity is normal and symmetric with the symptomatic side. No evidence of thrombus. Normal compressibility. Internal Jugular Vein: No evidence of thrombus. Normal compressibility, respiratory phasicity and response to augmentation. Subclavian Vein: No evidence of thrombus. Normal compressibility, respiratory phasicity and response to augmentation. Axillary Vein: No evidence of thrombus. Normal compressibility, respiratory phasicity and response to augmentation. Cephalic Vein: No evidence of thrombus. Normal compressibility, respiratory phasicity and response to augmentation. Basilic Vein: No evidence of thrombus. Normal compressibility, respiratory phasicity and response to augmentation. Brachial Veins: Occlusive thrombus of the right brachial vein, paired brachial veins. Radial Veins: No evidence of thrombus. Normal compressibility, respiratory phasicity and response to augmentation. Ulnar Veins: No evidence of thrombus. Normal compressibility, respiratory phasicity and response to augmentation. Other Findings:  Edema IMPRESSION: Sonographic survey of the right upper extremity is positive for occlusive DVT of the paired right brachial vein. Electronically Signed   By:  Corrie Mckusick D.O.   On: 04/15/2018 14:17   US Arterial Abi (screening Lower Extremity)  Result Date: 03/19/2018 CLINICAL DATA:  72 year old female with chronic bilateral lower extremity pain EXAM: NONINVASIVE PHYSIOLOGIC VASCULAR STUDY OF BILATERAL LOWER EXTREMITIES TECHNIQUE: Evaluation of both lower extremities were performed at rest, including calculation of ankle-brachial indices with single level Doppler, pressure and pulse volume recording. COMPARISON:  None. FINDINGS: Right ABI:  1.2 Left ABI:  1.1 Right Lower Extremity:  Normal arterial waveforms at the ankle. Left Lower Extremity:  Normal arterial waveforms at the ankle. 1.0-1.4 Normal IMPRESSION: Normal examination. No evidence of hemodynamically significant peripheral arterial disease. Signed, Criselda Peaches, MD, [facility] Electronically Signed   By: Jacqulynn Cadet M.D.   On: 03/19/2018 16:28   Dg Chest Port 1 View  Result Date: 04/11/2018 CLINICAL DATA:  PICC placement. EXAM: PORTABLE CHEST 1 VIEW COMPARISON:  Chest radiograph 04/06/2018. FINDINGS: Cardiomegaly.  Mild vascular congestion.  Prior CABG. RIGHT arm PICC line tip appears to lie distal SVC. This is marked with an arrow. No pneumothorax. IMPRESSION: RIGHT arm PICC line tip appears to lie distal SVC. No pneumothorax. Cardiomegaly with mild vascular congestion, stable from priors. Electronically Signed   By: Staci Righter M.D.   On: 04/11/2018 09:34   Ct Image Guided Fluid Drain By Catheter  Result Date: 04/16/2018 INDICATION: Pelvic abscess x2 EXAM: PELVIC ABSCESS DRAIN X2 CT-GUIDED MEDICATIONS: The patient is currently admitted to the hospital and receiving intravenous antibiotics. The antibiotics were administered within an appropriate time frame prior to the initiation of the procedure. ANESTHESIA/SEDATION: Fentanyl 0 mcg IV; Versed 4 mg IV, 4 mg morphine sulfate Moderate Sedation Time:  53 minutes The  patient was continuously monitored during the procedure by the interventional radiology nurse under my direct supervision. COMPLICATIONS: None immediate. PROCEDURE: Informed written consent was obtained from the patient after a thorough discussion of the procedural risks, benefits and alternatives. All questions were addressed. Maximal Sterile Barrier Technique was utilized including caps, mask, sterile gowns, sterile gloves, sterile drape, hand hygiene and skin antiseptic. A timeout was performed prior to the initiation of the procedure. In the prone position, the right gluteal region was prepped and draped in a sterile fashion.  Under CT guidance, an 18 gauge needle was advanced into the pelvic abscess and removed over an Amplatz wire. Ten [place] dilator followed by a 10 [place] drain were inserted in the fluid collection. Pus was aspirated. The patient was placed supine. The lower abdomen was prepped and draped in a sterile fashion. 1% lidocaine was utilized for local anesthesia. Under CT guidance, an 18 gauge needle was inserted into the upper pelvic fluid collection and removed over an Amplatz wire. Ten [place] dilator followed by 10 [place] drain were inserted. Cloudy yellow fluid was aspirated. FINDINGS: Images demonstrate 80 French drain placement into the 2 fluid collections described above. IMPRESSION: Successful pelvic abscess drain x2 Electronically Signed   By: Marybelle Killings M.D.   On: 04/16/2018 16:01     CBC Recent Labs  Lab 04/11/18 0920 04/13/18 0438 04/15/18 0859 04/16/18 0423 04/17/18 0443  WBC 11.1* 11.7* 8.9 8.4 7.4  HGB 11.3* 10.5* 10.1* 9.3* 8.7*  HCT 36.6 32.8* 31.3* 28.2* 27.3*  PLT 273 290 205 172 188  MCV 94.8 94.5 92.1 90.1 90.1  MCH 29.3 30.3 29.7 29.7 28.7  MCHC 30.9 32.0 32.3 33.0 31.9  RDW 18.2* 17.4* 17.0* 16.5* 16.5*    Chemistries  Recent Labs  Lab 04/11/18 0920 04/13/18 0438 04/15/18 0859 04/17/18 0443  NA 143 141 139 137  K 4.6 4.5 3.6 3.1*  CL 115* 112*  104 100  CO2 20* 24 26 28   GLUCOSE 86 109* 103* 78  BUN 44* 35* 22 21  CREATININE 0.96 0.77 0.83 1.08*  CALCIUM 7.7* 8.5* 8.3* 7.5*  AST  --   --  69*  --   ALT  --   --  43  --   ALKPHOS  --   --  63  --   BILITOT  --   --  1.0  --    ------------------------------------------------------------------------------------------------------------------ No results for input(s): CHOL, HDL, LDLCALC, TRIG, CHOLHDL, LDLDIRECT in the last 72 hours.  Lab Results  Component Value Date   HGBA1C 5.9 (H) 04/07/2018   ------------------------------------------------------------------------------------------------------------------ No results for input(s): TSH, T4TOTAL, T3FREE, THYROIDAB in the last 72 hours.  Invalid input(s): FREET3 ------------------------------------------------------------------------------------------------------------------ No results for input(s): VITAMINB12, FOLATE, FERRITIN, TIBC, IRON, RETICCTPCT in the last 72 hours.  Coagulation profile No results for input(s): INR, PROTIME in the last 168 hours.  No results for input(s): DDIMER in the last 72 hours.  Cardiac Enzymes No results for input(s): CKMB, TROPONINI, MYOGLOBIN in the last 168 hours.  Invalid input(s): CK ------------------------------------------------------------------------------------------------------------------    Component Value Date/Time   BNP 34.0 06/14/2014 1415    Trequan Marsolek MD [facility] pgr [phone] 11/03/2017, 9:25 AM  [facility] - Office  [phone]

## 2018-04-17 NOTE — Progress Notes (Signed)
ANTICOAGULATION CONSULT NOTE - Follow Up Consult  Pharmacy Consult for Heparin Indication: DVT  Allergies  Allergen Reactions  . Fentanyl Anaphylaxis and Shortness Of Breath  . Lactose Intolerance (Gi) Other (See Comments)    G.I. Upset  . Butrans [Buprenorphine] Rash and Other (See Comments)    Infected skin underneath application  . Penicillins Rash    Facial rash Has patient had a PCN reaction causing immediate rash, facial/tongue/throat swelling, SOB or lightheadedness with hypotension: Yes Has patient had a PCN reaction causing severe rash involving mucus membranes or skin necrosis: No Has patient had a PCN reaction that required hospitalization No Has patient had a PCN reaction occurring within the last 10 years: Yes If all of the above answers are "NO", then may proceed with Cephalosporin use.   . Simvastatin Rash    Patient Measurements: Height: 5' 5.5" (166.4 cm) Weight: 277 lb 12.5 oz (126 kg) IBW/kg (Calculated) : 58.15 Heparin Dosing Weight:  88.7 kg  Vital Signs: Temp: 98.2 F (36.8 C) (11/07 0406) Temp Source: Oral (11/06 2220) BP: 141/64 (11/07 0406) Pulse Rate: 90 (11/07 0406)  Labs: Recent Labs    04/15/18 0859 04/16/18 0423 04/17/18 0443  HGB 10.1* 9.3* 8.7*  HCT 31.3* 28.2* 27.3*  PLT 205 172 188  HEPARINUNFRC  --  0.51 0.40  CREATININE 0.83  --  1.08*    Estimated Creatinine Clearance: 62.5 mL/min (A) (by C-G formula based on SCr of 1.08 mg/dL (H)).   Assessment: Anticoag: DVT from PICC. IV heparin. HL 0.51. Hgb down to 9.3. Plts down 172. Off this AM for IR drain placement. Spoke with Dr. Barbie Banner who ok'd restart 1 hr post-procedure.  11/7 AM update: heparin level has been therapeutic twice on 1500 units/hr, Hgb down a little (watch)  Goal of Therapy:  Heparin level 0.3-0.7 units/ml Monitor platelets by anticoagulation protocol: Yes   Plan:  Cont heparin at 1500 units/hr Daily CBC/HL Trend Hgb  Narda Bonds, PharmD, BCPS Clinical  Pharmacist Phone: [phone]

## 2018-04-17 NOTE — Progress Notes (Signed)
Referring Physician(s): Elgergawy,D  Supervising Physician: Markus Daft  Patient Status:  [facility] - In-pt  Chief Complaint:  Abdominal/pelvic pain  Subjective: Pt doing fair today; states she still has pelvic discomfort; passing gas; she had fingers/toes/elbow pinched during CT yesterday   Allergies: Fentanyl; Lactose intolerance (gi); Butrans [buprenorphine]; Penicillins; and Simvastatin  Medications: Prior to Admission medications   Medication Sig Start Date End Date Taking? Authorizing Provider  Blood Glucose Monitoring Suppl (FREESTYLE LITE) DEVI See admin instructions. 12/22/14  Yes [provider]  ciprofloxacin (CIPRO) 500 MG/5ML (10%) suspension Take by mouth 2 (two) times daily.   Yes [provider]  clopidogrel (PLAVIX) 75 MG tablet Take 1 tablet (75 mg total) by mouth daily with breakfast. 01/14/18  Yes Rehman, Mechele Dawley, MD  Dexlansoprazole 30 MG capsule Take 40 mg by mouth daily.   Yes [provider]  diclofenac sodium (VOLTAREN) 1 % GEL Apply 2 g topically 4 (four) times daily as needed (Pain).    Yes [provider]  glipiZIDE (GLUCOTROL) 10 MG tablet Take 10 mg by mouth daily before breakfast.   Yes [provider]  HYDROcodone-acetaminophen (NORCO) 10-325 MG tablet Take 1 tablet by mouth 4 (four) times daily.   Yes [provider]  IRON PO Take 1 tablet by mouth daily.   Yes [provider]  isosorbide dinitrate (ISORDIL) 5 MG tablet Take 1 tablet (5 mg total) by mouth 3 (three) times daily before meals. 02/07/18  Yes Rehman, Mechele Dawley, MD  leflunomide (ARAVA) 20 MG tablet Take 20 mg by mouth daily.   Yes [provider]  metoprolol succinate (TOPROL-XL) 25 MG 24 hr tablet Take 25 mg by mouth daily.   Yes [provider]  metroNIDAZOLE (FLAGYL) 250 MG tablet Take 250 mg by mouth 3 (three) times daily.   Yes [provider]  Olmesartan-amLODIPine-HCTZ (TRIBENZOR) 40-5-25 MG TABS  Take 1 tablet by mouth daily.    Yes [provider]  pravastatin (PRAVACHOL) 40 MG tablet Take 40 mg by mouth at bedtime.   Yes [provider]  predniSONE (DELTASONE) 5 MG tablet Take 5 mg by mouth daily with breakfast.   Yes [provider]  pregabalin (LYRICA) 75 MG capsule Take 75 mg by mouth 3 (three) times daily.   Yes [provider]  Tafluprost (ZIOPTAN) 0.0015 % SOLN Place 1 drop into both eyes at bedtime.    Yes [provider]  temazepam (RESTORIL) 30 MG capsule Take 30 mg by mouth at bedtime.   Yes [provider]  timolol (BETIMOL) 0.5 % ophthalmic solution Place 1 drop into both eyes 2 (two) times daily.    Yes [provider]  Tofacitinib Citrate (XELJANZ) 5 MG TABS Take 1 tablet by mouth daily.   Yes [provider]  VITAMIN D, ERGOCALCIFEROL, PO Take 50,000 Units by mouth once a week. Thursdays   Yes [provider]  docusate sodium (COLACE) 100 MG capsule Take 2 capsules (200 mg total) by mouth at bedtime. Patient taking differently: Take 200 mg by mouth as needed.  02/13/18   Rogene Houston, MD     Vital Signs: BP (!) 141/64 (BP Location: Left Arm)   Pulse 90   Temp 98.2 F (36.8 C)   Resp 18   Ht 5' 5.5" (1.664 m)   Wt 277 lb 12.5 oz (126 kg)   SpO2 94%   BMI 45.52 kg/m   Physical Exam rt TG /left abd drain  intact, feculent appearing fluid in TG drain , more serous appearing in left drain; dressings dry, sites mildly tender  Imaging: Ct Abdomen Pelvis Wo Contrast  Result Date: 04/15/2018 CLINICAL DATA:  Acute lower abdominal pain. EXAM: CT ABDOMEN AND PELVIS WITHOUT CONTRAST TECHNIQUE: Multidetector CT imaging of the abdomen and pelvis was performed following the standard protocol without IV contrast. COMPARISON:  CT scan of April 06, 2018. FINDINGS: Lower chest: No acute abnormality. Hepatobiliary: No focal liver abnormality is seen. Status post cholecystectomy. No biliary  dilatation. Pancreas: Unremarkable. No pancreatic ductal dilatation or surrounding inflammatory changes. Spleen: Normal in size without focal abnormality. Adrenals/Urinary Tract: Adrenal glands appear normal. Small nonobstructive right renal calculus is noted. No hydronephrosis or renal obstruction is noted. Urinary bladder is unremarkable. Stomach/Bowel: The stomach and appendix are unremarkable. Sigmoid diverticulosis is noted. Some degree of residual diverticulitis cannot be excluded. However, 7.3 x 5.8 cm fluid collection is noted in left lower quadrant, which communicates with smaller fluid collection measuring 5.1 x 3.6 cm. This is concerning for abscess status post previous perforated diverticulitis. Air-fluid collection measuring 8.2 x 5.9 cm is seen in the right lower quadrant also concerning for possible abscess. 8.9 x 5.3 cm fluid and stool collection is seen posterior to the uterus and anterior to the rectum concerning for abscess or contained perforation. Vascular/Lymphatic: Aortic atherosclerosis. No enlarged abdominal or pelvic lymph nodes. Reproductive: Uterus and bilateral adnexa are unremarkable. Other: Stable 6.9 x 4.5 cm fluid collection in left inguinal region. This most likely is benign. Musculoskeletal: No acute or significant osseous findings. IMPRESSION: There are at least 3 fluid collections and probable abscesses seen in the lower abdomen and pelvis, following previously perforated diverticulitis. 7.3 x 5.8 cm fluid collection is noted in left lower quadrant, as well as air-fluid collection measuring 8.2 x 5.9 cm seen in the right lower quadrant. Also noted is 8.9 x 5.3 cm fluid collection in the pelvis in the pre rectal space which appears to contain stool and fluid. These results will be called to the ordering clinician or representative by the Radiologist Assistant, and communication documented in the PACS or zVision Dashboard. Small nonobstructive right renal calculus. Aortic  Atherosclerosis (ICD10-I70.0). Electronically Signed   By: Marijo Conception, M.D.   On: 04/15/2018 14:10   US Venous Img Upper Uni Right  Result Date: 04/15/2018 CLINICAL DATA:  None EXAM: RIGHT UPPER EXTREMITY VENOUS DOPPLER ULTRASOUND TECHNIQUE: Gray-scale sonography with graded compression, as well as color Doppler and duplex ultrasound were performed to evaluate the upper extremity deep venous system from the level of the subclavian vein and including the jugular, axillary, basilic, radial, ulnar and upper cephalic vein. Spectral Doppler was utilized to evaluate flow at rest and with distal augmentation maneuvers. COMPARISON:  None. FINDINGS: Contralateral Subclavian Vein: Respiratory phasicity is normal and symmetric with the symptomatic side. No evidence of thrombus. Normal compressibility. Internal Jugular Vein: No evidence of thrombus. Normal compressibility, respiratory phasicity and response to augmentation. Subclavian Vein: No evidence of thrombus. Normal compressibility, respiratory phasicity and response to augmentation. Axillary Vein: No evidence of thrombus. Normal compressibility, respiratory phasicity and response to augmentation. Cephalic Vein: No evidence of thrombus. Normal compressibility, respiratory phasicity and response to augmentation. Basilic Vein: No evidence of thrombus. Normal compressibility, respiratory phasicity and response to augmentation. Brachial Veins: Occlusive thrombus of the right brachial vein, paired brachial veins. Radial Veins: No evidence of thrombus. Normal compressibility, respiratory phasicity and response to augmentation. Ulnar Veins: No evidence of thrombus. Normal compressibility, respiratory  phasicity and response to augmentation. Other Findings:  Edema IMPRESSION: Sonographic survey of the right upper extremity is positive for occlusive DVT of the paired right brachial vein. Electronically Signed   By: Corrie Mckusick D.O.   On: 04/15/2018 14:17   Ct Image  Guided Fluid Drain By Catheter  Result Date: 04/16/2018 INDICATION: Pelvic abscess x2 EXAM: PELVIC ABSCESS DRAIN X2 CT-GUIDED MEDICATIONS: The patient is currently admitted to the hospital and receiving intravenous antibiotics. The antibiotics were administered within an appropriate time frame prior to the initiation of the procedure. ANESTHESIA/SEDATION: Fentanyl 0 mcg IV; Versed 4 mg IV, 4 mg morphine sulfate Moderate Sedation Time:  53 minutes The patient was continuously monitored during the procedure by the interventional radiology nurse under my direct supervision. COMPLICATIONS: None immediate. PROCEDURE: Informed written consent was obtained from the patient after a thorough discussion of the procedural risks, benefits and alternatives. All questions were addressed. Maximal Sterile Barrier Technique was utilized including caps, mask, sterile gowns, sterile gloves, sterile drape, hand hygiene and skin antiseptic. A timeout was performed prior to the initiation of the procedure. In the prone position, the right gluteal region was prepped and draped in a sterile fashion. Under CT guidance, an 18 gauge needle was advanced into the pelvic abscess and removed over an Amplatz wire. Ten [place] dilator followed by a 10 [place] drain were inserted in the fluid collection. Pus was aspirated. The patient was placed supine. The lower abdomen was prepped and draped in a sterile fashion. 1% lidocaine was utilized for local anesthesia. Under CT guidance, an 18 gauge needle was inserted into the upper pelvic fluid collection and removed over an Amplatz wire. Ten [place] dilator followed by 10 [place] drain were inserted. Cloudy yellow fluid was aspirated. FINDINGS: Images demonstrate 61 French drain placement into the 2 fluid collections described above. IMPRESSION: Successful pelvic abscess drain x2 Electronically Signed   By: Marybelle Killings M.D.   On: 04/16/2018 16:01    Labs:  CBC: Recent Labs    04/13/18 0438  04/15/18 0859 04/16/18 0423 04/17/18 0443  WBC 11.7* 8.9 8.4 7.4  HGB 10.5* 10.1* 9.3* 8.7*  HCT 32.8* 31.3* 28.2* 27.3*  PLT 290 205 172 188    COAGS: Recent Labs    04/06/18 1952  INR 1.22  APTT 33    BMP: Recent Labs    04/11/18 0920 04/13/18 0438 04/15/18 0859 04/17/18 0443  NA 143 141 139 137  K 4.6 4.5 3.6 3.1*  CL 115* 112* 104 100  CO2 20* 24 26 28   GLUCOSE 86 109* 103* 78  BUN 44* 35* 22 21  CALCIUM 7.7* 8.5* 8.3* 7.5*  CREATININE 0.96 0.77 0.83 1.08*  GFRNONAA 57* >60 >60 50*  GFRAA >60 >60 >60 58*    LIVER FUNCTION TESTS: Recent Labs    04/08/18 0415 04/09/18 0355 04/10/18 0351 04/15/18 0859  BILITOT 0.6 0.5 0.7 1.0  AST 46* 54* 41 69*  ALT 33 36 30 43  ALKPHOS 48 55 51 63  PROT 5.6* 5.2* 5.5* 5.6*  ALBUMIN 2.3* 2.2* 2.2* 2.1*    Assessment and Plan: Pt s/p drainage x 2 presumably diverticular abscesses 11/6; afebrile; WBC nl; hgb 8.7(9.3), creat 1.08, K 3.1- replace; fluid cx pend; cont drain irrigation tid, monitor outputs closely ; OOB /PT; will need f/u CT/likely drain injections within 1 week of placement   Electronically Signed: D. Rowe Robert, PA-C 04/17/2018, 9:10 AM   I spent a total of 15 minutes at the the  patient's bedside AND on the patient's hospital floor or unit, greater than 50% of which was counseling/coordinating care for pelvic abscess drains    Patient ID: [name], female   DOB: [birth year], 72 y.o.   MRN: [id number]

## 2018-04-17 NOTE — Progress Notes (Signed)
ANTICOAGULATION CONSULT NOTE - Follow Up Consult  Pharmacy Consult for Heparin Indication: DVT  Allergies  Allergen Reactions  . Fentanyl Anaphylaxis and Shortness Of Breath  . Lactose Intolerance (Gi) Other (See Comments)    G.I. Upset  . Butrans [Buprenorphine] Rash and Other (See Comments)    Infected skin underneath application  . Penicillins Rash    Facial rash Has patient had a PCN reaction causing immediate rash, facial/tongue/throat swelling, SOB or lightheadedness with hypotension: Yes Has patient had a PCN reaction causing severe rash involving mucus membranes or skin necrosis: No Has patient had a PCN reaction that required hospitalization No Has patient had a PCN reaction occurring within the last 10 years: Yes If all of the above answers are "NO", then may proceed with Cephalosporin use.   . Simvastatin Rash    Patient Measurements: Height: 5' 5.5" (166.4 cm) Weight: 277 lb 12.5 oz (126 kg) IBW/kg (Calculated) : 58.15 Heparin Dosing Weight:  88.7 kg  Vital Signs: Temp: 98.2 F (36.8 C) (11/07 0406) Temp Source: Oral (11/06 2220) BP: 141/64 (11/07 0406) Pulse Rate: 90 (11/07 0406)  Labs: Recent Labs    04/15/18 0859 04/16/18 0423 04/17/18 0443  HGB 10.1* 9.3* 8.7*  HCT 31.3* 28.2* 27.3*  PLT 205 172 188  HEPARINUNFRC  --  0.51 0.40  CREATININE 0.83  --  1.08*    Estimated Creatinine Clearance: 62.5 mL/min (A) (by C-G formula based on SCr of 1.08 mg/dL (H)).  Assessment:  Anticoag: DVT from PICC. IV heparin. HL 0.4 remains in goal range this AM. Hgb down to 8.7. Plts 188.   Goal of Therapy:  Heparin level 0.3-0.7 units/ml Monitor platelets by anticoagulation protocol: Yes   Plan:  Heparin 1500 units/hr Daily HL and CBC  [name], PharmD, BCPS Clinical Staff Pharmacist Pager [phone]  [name] 04/17/2018,7:42 AM

## 2018-04-17 NOTE — Progress Notes (Signed)
Physical Therapy Treatment Patient Details Name: [name] MRN: [id number] DOB: April 10, 1946 Today's Date: 04/17/2018    History of Present Illness [name]  is a 72 y.o. female, w hypertension, hyperlipidemia, dm2, pvd, CVA, OSA, Rheumatoid arthritis apparently c/o LLQ pain over the past several weeks, just started on abx yesterday, pt notes subjective fever as well as nausea.  Pt denies emesis, constipation, diarrhea, brbpr, black stool.  Pt presented due to worsening abdominal pain today.     PT Comments    Pt able to get up to chair today via the STEDY, standing x2 and able to get washed up in standing.   Follow Up Recommendations  SNF(working to build up to CIR level intensity)     Equipment Recommendations  None recommended by PT    Recommendations for Other Services       Precautions / Restrictions Precautions Precautions: Fall    Mobility  Bed Mobility Overal bed mobility: Needs Assistance Bed Mobility: Supine to Sit     Supine to sit: Max assist     General bed mobility comments: cues for sequencing, min assist to bridge to EOB x5, truncal assist up via R elbow.  Pt squared up at EOB with min guard.  Transfers Overall transfer level: Needs assistance   Transfers: Sit to/from Stand Sit to Stand: Max assist(pt helping significantly)         General transfer comment: Via STEDY, pt stood x2 in the [facility] : [facility]  Ambulation/Gait                 Stairs             Wheelchair Mobility    Modified Rankin (Stroke Patients Only)       Balance Overall balance assessment: Needs assistance Sitting-balance support: Feet supported Sitting balance-Leahy Scale: Fair Sitting balance - Comments: pt could scoot and square up at EOB without assist     Standing balance-Leahy Scale: Poor                              Cognition Arousal/Alertness: Awake/alert Behavior During Therapy: WFL for tasks assessed/performed Overall Cognitive  Status: Within Functional Limits for tasks assessed                                        Exercises Other Exercises Other Exercises: hip/knee AAROM flexion, resisted extension x10 reps bilaterally    General Comments        Pertinent Vitals/Pain Faces Pain Scale: Hurts even more Pain Location: abdomen/stomach Pain Descriptors / Indicators: Cramping;Grimacing;Sharp Pain Intervention(s): Monitored during session    Home Living                      Prior Function            PT Goals (current goals can now be found in the care plan section) Acute Rehab PT Goals Patient Stated Goal: return home PT Goal Formulation: With patient/family Time For Goal Achievement: 04/28/18 Potential to Achieve Goals: Fair Progress towards PT goals: Progressing toward goals    Frequency    Min 3X/week      PT Plan Current plan remains appropriate    Co-evaluation              AM-PAC PT "6 Clicks" Daily Activity  Outcome Measure  Difficulty  turning over in bed (including adjusting bedclothes, sheets and blankets)?: Unable Difficulty moving from lying on back to sitting on the side of the bed? : Unable Difficulty sitting down on and standing up from a chair with arms (e.g., wheelchair, bedside commode, etc,.)?: Unable Help needed moving to and from a bed to chair (including a wheelchair)?: A Lot Help needed walking in hospital room?: Total Help needed climbing 3-5 steps with a railing? : Total 6 Click Score: 7    End of Session   Activity Tolerance: Patient tolerated treatment well;Patient limited by fatigue;Patient limited by pain Patient left: in chair;with call bell/phone within reach;with family/visitor present Nurse Communication: Mobility status PT Visit Diagnosis: Unsteadiness on feet (R26.81);Muscle weakness (generalized) (M62.81)     Time: 6468-0321 PT Time Calculation (min) (ACUTE ONLY): 51 min  Charges:  $Therapeutic Activity: 38-52  mins                     04/17/2018  Donnella Sham, PT Acute Rehabilitation Services [phone]  (pager) [phone]  (office)   Tessie Fass Neaveh Belanger 04/17/2018, 11:21 AM

## 2018-04-17 NOTE — Progress Notes (Signed)
Patient not available during times I went by. Conard Novak, Chaplain

## 2018-04-17 NOTE — Progress Notes (Signed)
BS 70. Pt is NPO, so cannot administer juice. MD made aware. Will continue to monitor.

## 2018-04-18 LAB — CBC
HCT: 27.7 % — ABNORMAL LOW (ref 36.0–46.0)
Hemoglobin: 8.8 g/dL — ABNORMAL LOW (ref 12.0–15.0)
MCH: 28.6 pg (ref 26.0–34.0)
MCHC: 31.8 g/dL (ref 30.0–36.0)
MCV: 89.9 fL (ref 80.0–100.0)
NRBC: 0 % (ref 0.0–0.2)
PLATELETS: 202 10*3/uL (ref 150–400)
RBC: 3.08 MIL/uL — AB (ref 3.87–5.11)
RDW: 16.1 % — ABNORMAL HIGH (ref 11.5–15.5)
WBC: 8.5 10*3/uL (ref 4.0–10.5)

## 2018-04-18 LAB — BASIC METABOLIC PANEL
ANION GAP: 11 (ref 5–15)
BUN: 15 mg/dL (ref 8–23)
CO2: 28 mmol/L (ref 22–32)
Calcium: 7.3 mg/dL — ABNORMAL LOW (ref 8.9–10.3)
Chloride: 99 mmol/L (ref 98–111)
Creatinine, Ser: 1.09 mg/dL — ABNORMAL HIGH (ref 0.44–1.00)
GFR, EST AFRICAN AMERICAN: 57 mL/min — AB (ref >60)
GFR, EST NON AFRICAN AMERICAN: 49 mL/min — AB (ref >60)
GLUCOSE: 106 mg/dL — AB (ref 70–99)
POTASSIUM: 3.1 mmol/L — AB (ref 3.5–5.1)
Sodium: 138 mmol/L (ref 135–145)

## 2018-04-18 LAB — GLUCOSE, CAPILLARY
GLUCOSE-CAPILLARY: 103 mg/dL — AB (ref 70–99)
GLUCOSE-CAPILLARY: 107 mg/dL — AB (ref 70–99)
GLUCOSE-CAPILLARY: 109 mg/dL — AB (ref 70–99)
Glucose-Capillary: 116 mg/dL — ABNORMAL HIGH (ref 70–99)
Glucose-Capillary: 116 mg/dL — ABNORMAL HIGH (ref 70–99)
Glucose-Capillary: 114 mg/dL — ABNORMAL HIGH (ref 70–99)

## 2018-04-18 LAB — MAGNESIUM: MAGNESIUM: 0.8 mg/dL — AB (ref 1.7–2.4)

## 2018-04-18 LAB — HEPARIN LEVEL (UNFRACTIONATED): Heparin Unfractionated: 0.66 IU/mL (ref 0.30–0.70)

## 2018-04-18 MED ORDER — GLUCERNA SHAKE PO LIQD: 237.0000 mL | Freq: Three times a day (TID) | ORAL | Status: DC

## 2018-04-18 MED ORDER — MAGNESIUM SULFATE 2 GM/50ML IV SOLN
2.0000 g | Freq: Once | INTRAVENOUS | Status: AC
  Administered 2018-04-18: 2 g via INTRAVENOUS
  Filled 2018-04-18: qty 50

## 2018-04-18 MED ORDER — LOPERAMIDE HCL 2 MG PO CAPS
4.0000 mg | ORAL_CAPSULE | Freq: Once | ORAL | Status: AC
  Administered 2018-04-18: 4 mg via ORAL
  Filled 2018-04-18: qty 2

## 2018-04-18 MED ORDER — TEMAZEPAM 15 MG PO CAPS
30.0000 mg | ORAL_CAPSULE | Freq: Once | ORAL | Status: DC
  Filled 2018-04-18 (×3): qty 2

## 2018-04-18 MED ORDER — OXYCODONE HCL 5 MG PO TABS
2.5000 mg | ORAL_TABLET | Freq: Four times a day (QID) | ORAL | Status: DC | PRN
  Administered 2018-04-21 – 2018-04-23 (×3): 2.5 mg via ORAL
  Filled 2018-04-18 (×3): qty 1

## 2018-04-18 MED ORDER — POTASSIUM CHLORIDE CRYS ER 20 MEQ PO TBCR
40.0000 meq | EXTENDED_RELEASE_TABLET | ORAL | Status: AC
  Administered 2018-04-18 (×3): 40 meq via ORAL
  Filled 2018-04-18 (×3): qty 2

## 2018-04-18 MED ORDER — OXYCODONE HCL 5 MG PO TABS: 5.0000 mg | ORAL_TABLET | Freq: Four times a day (QID) | ORAL | Status: DC | PRN

## 2018-04-18 MED ORDER — ALBUTEROL SULFATE (2.5 MG/3ML) 0.083% IN NEBU: 2.5000 mg | INHALATION_SOLUTION | Freq: Four times a day (QID) | RESPIRATORY_TRACT | Status: DC | PRN

## 2018-04-18 NOTE — Progress Notes (Signed)
CSW will continue to follow patient for medical readiness.   Percell Locus Braxley Balandran LCSW [phone]

## 2018-04-18 NOTE — Progress Notes (Signed)
CRITICAL VALUE ALERT  Critical Value:  Magnesium 0.8  Date & Time Notified:  04/18/18 0425  Provider Notified:  X. Blount, NP  Orders Received/Actions taken:  2g Magnesium Sulfate IV

## 2018-04-18 NOTE — Progress Notes (Addendum)
Pharmacy Antibiotic Note + Heparin for DVT  [name] is a 72 y.o. female admitted on 04/05/2018 with Sepsis with pneumoperitoneum secondary to worsening perforated sigmoid diverticulitis with multiple abdominal fluid collections and multiple abscesses..  Pharmacy has been consulted for Merrem dosing.  Anticoag: DVT from PICC. IV heparin. HL 0.66. Hgb 8.8 low but stable.. Plts 202.   ID: Tmax 100.2.  WBC 8.5 WNL. Scr 1.09 Sepsis with pneumoperitoneum secondary to worsening perforated sigmoid diverticulitis with multiple abdominal fluid collections and multiple abscesses.2 drains placed 11/6.-resumed Merrem.   Cipro 10/26 >> 10/27 Flagyl 10/26 >> 10/27 Merrem 10/27>>11/4,  11/5 >>  10/26 BCx: negative 11/6: Pelvic abscess 1>> 11/6: Pelvic abscess 2: GPCC, GPR 10/28 MRSA PCR: neg   Plan: Continue Merrem 1g IV q 8 hrs Continue IV heparin at 1500 units/hr. Daily HL and CBC    Height: 5' 5.5" (166.4 cm) Weight: 272 lb 4.3 oz (123.5 kg) IBW/kg (Calculated) : 58.15  Temp (24hrs), Avg:99.3 F (37.4 C), Min:98.8 F (37.1 C), Max:100.2 F (37.9 C)  Recent Labs  Lab 04/11/18 0920 04/13/18 0438 04/15/18 0859 04/16/18 0423 04/17/18 0443 04/18/18 0309  WBC 11.1* 11.7* 8.9 8.4 7.4 8.5  CREATININE 0.96 0.77 0.83  --  1.08* 1.09*    Estimated Creatinine Clearance: 61.2 mL/min (A) (by C-G formula based on SCr of 1.09 mg/dL (H)).    Allergies  Allergen Reactions  . Fentanyl Anaphylaxis and Shortness Of Breath  . Lactose Intolerance (Gi) Other (See Comments)    G.I. Upset  . Butrans [Buprenorphine] Rash and Other (See Comments)    Infected skin underneath application  . Penicillins Rash    Facial rash Has patient had a PCN reaction causing immediate rash, facial/tongue/throat swelling, SOB or lightheadedness with hypotension: Yes Has patient had a PCN reaction causing severe rash involving mucus membranes or skin necrosis: No Has patient had a PCN reaction that required  hospitalization No Has patient had a PCN reaction occurring within the last 10 years: Yes If all of the above answers are "NO", then may proceed with Cephalosporin use.   . Simvastatin Rash    Farhiya Rosten S. Alford Highland, PharmD, BCPS Clinical Staff Pharmacist [name] 04/18/2018 7:12 AM

## 2018-04-18 NOTE — Progress Notes (Signed)
Patient Demographics:    [name], is a 72 y.o. female, DOB - December 19, 1945, LKJ:[id number]  Admit date - 04/05/2018   Admitting Physician [name], MD  Outpatient Primary MD for the patient is [name], MD  LOS - 11   Chief Complaint  Patient presents with  . Abdominal Pain      Brief narrative:  72yo female PMH HTN, DM-II, CKD, RA, H/o CAD s/p CABG (on plavix,), who was admitted to [facility] 10/26 with acute sigmoid diverticulitis without perforation or abscess. Initially felt to be improving on IV merrem, but she continued to have abdominal pain, CT abdomen pelvis was obtained 04/15/2018, was significant for worsening diverticulitis now with multiple abdominal fluid collections.  Patient developed acute right upper extremity DVT in the setting of PICC line, she was transferred from [facility] to [place] [facility] 04/15/2018 for evaluation by IR.  She has drain placed by IR 04/16/2018   Subjective:    [name] denies any fever or chills overnight, still reports some diarrhea, reports abdominal pain at baseline .   Assessment  & P lan :    Principal Problem:   Diverticulitis Active Problems:   DM (diabetes mellitus) ([place])   HTN (hypertension)   Rheumatoid arthritis ([place])   Anemia   Renal insufficiency   Sepsis due to undetermined organism ([place])   CKD (chronic kidney disease) stage 3, GFR 30-59 ml/min (HCC)   Fibromyalgia   Diverticulitis large intestine w/o perforation or abscess w/o bleeding   Acute diverticulitis   Perforated sigmoid colon (HCC)   Acute renal failure superimposed on stage 3 chronic kidney disease ([place])   Sepsis due to acute perforated sigmoid diverticulitis- -sepsis criteria met on admission - imaging 04/06/2018 on admission significant for perforated sigmoid diverticulitis, was managed conservatively on IV meropenem, clear liquid diet, . -Repeat imaging 04/15/2018 with  large abscesses in the abdomen, transferred to [facility] for further care, general surgery consult greatly appreciated. -IR consult appreciated, status post drainage x2, for her diverticular abscess, with with drain, Gram stain showing gram-positive cocci, gram-positive rods as well, she is on meropenem, can you with current regimen will await final sensitivity before making recommendation of antibiotic adjustment.  Acute right upper extremity DVT -In the setting of PICC line insertion, start on anticoagulation, continue with heparin for anticoagulation until no surgery or further procedure is indicated. -PICC line  discontinued 04/16/2018  acute kidney injury on CKD3. Baseline creat 1.2.   - AKI resolved w/ IVF but creat down to 0.7 now, appears to be volume overloaded, continue with diuresis as blood pressure tolerates.  Hypertension -Continue with metoprolol, given soft blood pressure continue to hold home medication including as  isosorbide/ olmesartan/amlodipine/HCTZ combo -Patient's blood pressure has improved, so I will start her on IV diuresis and hold with resuming her the rest of antihypertensive medication and monitor blood pressure   DM2 -   BS's are good, cont Novolog/Humalog Sliding scale insulin  H/o CAD sp CABG /hx TIA - previously on Plavix and pravastatin, continue to hold Plavix may need further procedures or surgery  Debility - needs to get OOB , PT seeing patient , SNF recommended, SW consulted   acute on chronic anemia--- sp 2 u prbc's,  Started on Lexapro for depression  hypoKalemia -Treated, recheck in a.m.  Code Status : full  Disposition Plan  : TBD, will prob need SNF  Consults  :  Gen Surgery, IR  DVT Prophylaxis  :   SCDs, heparin GTT   Lab Results  Component Value Date   PLT 202 04/18/2018    Inpatient Medications  Scheduled Meds: . Chlorhexidine Gluconate Cloth  6 each Topical Daily  . escitalopram  10 mg Oral QHS  . feeding supplement  (ENSURE ENLIVE)  237 mL Oral TID BM  . feeding supplement (GLUCERNA SHAKE)  237 mL Oral TID BM  . furosemide  40 mg Intravenous Daily  . latanoprost  1 drop Both Eyes QHS  . metoprolol tartrate  25 mg Oral BID  . nystatin   Topical TID  . pantoprazole  40 mg Oral Daily  . potassium chloride  40 mEq Oral Q4H  . pravastatin  40 mg Oral QHS  . sodium chloride flush  10-40 mL Intracatheter Q12H  . sodium chloride flush  5 mL Intracatheter Q8H  . timolol  1 drop Both Eyes BID   Continuous Infusions: . heparin 1,500 Units/hr (04/18/18 1331)  . meropenem (MERREM) IV 1 g (04/18/18 1303)   PRN Meds:.acetaminophen **OR** acetaminophen, hydrALAZINE, HYDROcodone-acetaminophen, ondansetron (ZOFRAN) IV, oxyCODONE, senna-docusate, sodium chloride flush    Anti-infectives (From admission, onward)   Start     Dose/Rate Route Frequency Ordered Stop   04/15/18 1700  meropenem (MERREM) 1 g in sodium chloride 0.9 % 100 mL IVPB     1 g 200 mL/hr over 30 Minutes Intravenous Every 8 hours 04/15/18 1534     04/09/18 2200  meropenem (MERREM) 1 g in sodium chloride 0.9 % 100 mL IVPB  Status:  Discontinued     1 g 200 mL/hr over 30 Minutes Intravenous Every 8 hours 04/09/18 1244 04/14/18 0944   04/06/18 1300  meropenem (MERREM) 1 g in sodium chloride 0.9 % 100 mL IVPB  Status:  Discontinued     1 g 200 mL/hr over 30 Minutes Intravenous Every 12 hours 04/06/18 1145 04/09/18 1244   04/06/18 1145  meropenem (MERREM) 1 g in sodium chloride 0.9 % 100 mL IVPB  Status:  Discontinued     1 g 200 mL/hr over 30 Minutes Intravenous Every 8 hours 04/06/18 1139 04/06/18 1145   04/06/18 0900  ciprofloxacin (CIPRO) IVPB 400 mg  Status:  Discontinued     400 mg 200 mL/hr over 60 Minutes Intravenous Every 12 hours 04/06/18 0732 04/06/18 1139   04/06/18 0300  metroNIDAZOLE (FLAGYL) IVPB 500 mg  Status:  Discontinued     500 mg 100 mL/hr over 60 Minutes Intravenous Every 8 hours 04/05/18 2335 04/06/18 1139   04/05/18 1930   ciprofloxacin (CIPRO) IVPB 400 mg     400 mg 200 mL/hr over 60 Minutes Intravenous  Once 04/05/18 1925 04/05/18 2217   04/05/18 1930  metroNIDAZOLE (FLAGYL) IVPB 500 mg     500 mg 100 mL/hr over 60 Minutes Intravenous  Once 04/05/18 1925 04/05/18 2102        Objective:   Vitals:   04/17/18 2026 04/18/18 0432 04/18/18 0440 04/18/18 1222  BP: 104/64 (!) 100/55  101/66  Pulse: (!) 155 80  84  Resp:  15    Temp:  98.8 F (37.1 C)    TempSrc:  Oral    SpO2: 93% 97%    Weight:   123.5 kg   Height:  Wt Readings from Last 3 Encounters:  04/18/18 123.5 kg  02/13/18 116.1 kg  01/13/18 120.7 kg     Intake/Output Summary (Last 24 hours) at 04/18/2018 1446 Last data filed at 04/18/2018 0631 Gross per 24 hour  Intake 1475.29 ml  Output 975 ml  Net 500.29 ml     Physical Exam  Awake Alert, Oriented X 3, No new F.N deficits, Normal affect Symmetrical Chest wall movement, Good air movement bilaterally, CTAB RRR,No Gallops,Rubs or new Murmurs, No Parasternal Heave +ve B.Sounds, Abd Soft, mild diffuse tenderness, has 2 drains one anteriorly one posteriorly, No rebound - guarding or rigidity. No Cyanosis, Clubbing , No new Rash or bruise, lower extremity edema is improving, right upper extremity edema present       Data Review:   Micro Results Recent Results (from the past 240 hour(s))  Aerobic/Anaerobic Culture (surgical/deep wound)     Status: Abnormal (Preliminary result)   Collection Time: 04/16/18  3:13 PM  Result Value Ref Range Status   Specimen Description PELVIS ABSCESS  Final   Special Requests NONE  Final   Gram Stain   Final    FEW WBC PRESENT,BOTH PMN AND MONONUCLEAR FEW GRAM POSITIVE COCCI IN CLUSTERS FEW GRAM POSITIVE RODS Performed at [facility], [place] [address] [place], [place] [address]    Culture (A)  Final    MULTIPLE ORGANISMS PRESENT, NONE PREDOMINANT NO ANAEROBES ISOLATED; CULTURE IN PROGRESS FOR 5 DAYS    Report Status PENDING   Incomplete  Aerobic/Anaerobic Culture (surgical/deep wound)     Status: None (Preliminary result)   Collection Time: 04/16/18  3:13 PM  Result Value Ref Range Status   Specimen Description ABSCESS PELVIS  Final   Special Requests SAMPLE NO 2  Final   Gram Stain   Final    FEW WBC PRESENT,BOTH PMN AND MONONUCLEAR NO ORGANISMS SEEN    Culture   Final    NO GROWTH 2 DAYS Performed at [facility], [place] [address], [place], [place] [address]    Report Status PENDING  Incomplete    Radiology Reports Ct Abdomen Pelvis Wo Contrast  Result Date: 04/15/2018 CLINICAL DATA:  Acute lower abdominal pain. EXAM: CT ABDOMEN AND PELVIS WITHOUT CONTRAST TECHNIQUE: Multidetector CT imaging of the abdomen and pelvis was performed following the standard protocol without IV contrast. COMPARISON:  CT scan of April 06, 2018. FINDINGS: Lower chest: No acute abnormality. Hepatobiliary: No focal liver abnormality is seen. Status post cholecystectomy. No biliary dilatation. Pancreas: Unremarkable. No pancreatic ductal dilatation or surrounding inflammatory changes. Spleen: Normal in size without focal abnormality. Adrenals/Urinary Tract: Adrenal glands appear normal. Small nonobstructive right renal calculus is noted. No hydronephrosis or renal obstruction is noted. Urinary bladder is unremarkable. Stomach/Bowel: The stomach and appendix are unremarkable. Sigmoid diverticulosis is noted. Some degree of residual diverticulitis cannot be excluded. However, 7.3 x 5.8 cm fluid collection is noted in left lower quadrant, which communicates with smaller fluid collection measuring 5.1 x 3.6 cm. This is concerning for abscess status post previous perforated diverticulitis. Air-fluid collection measuring 8.2 x 5.9 cm is seen in the right lower quadrant also concerning for possible abscess. 8.9 x 5.3 cm fluid and stool collection is seen posterior to the uterus and anterior to the rectum concerning for abscess or contained  perforation. Vascular/Lymphatic: Aortic atherosclerosis. No enlarged abdominal or pelvic lymph nodes. Reproductive: Uterus and bilateral adnexa are unremarkable. Other: Stable 6.9 x 4.5 cm fluid collection in left inguinal region. This most  likely is benign. Musculoskeletal: No acute or significant osseous findings. IMPRESSION: There are at least 3 fluid collections and probable abscesses seen in the lower abdomen and pelvis, following previously perforated diverticulitis. 7.3 x 5.8 cm fluid collection is noted in left lower quadrant, as well as air-fluid collection measuring 8.2 x 5.9 cm seen in the right lower quadrant. Also noted is 8.9 x 5.3 cm fluid collection in the pelvis in the pre rectal space which appears to contain stool and fluid. These results will be called to the ordering clinician or representative by the Radiologist Assistant, and communication documented in the PACS or zVision Dashboard. Small nonobstructive right renal calculus. Aortic Atherosclerosis (ICD10-I70.0). Electronically Signed   By: Marijo Conception, M.D.   On: 04/15/2018 14:10   Ct Abdomen Pelvis Wo Contrast  Result Date: 04/06/2018 CLINICAL DATA:  72 year old female with concern for perforated diverticulitis. EXAM: CT ABDOMEN AND PELVIS WITHOUT CONTRAST TECHNIQUE: Multidetector CT imaging of the abdomen and pelvis was performed following the standard protocol without IV contrast. COMPARISON:  Chest radiograph dated 04/06/2018 and CT dated 04/05/2018 FINDINGS: Evaluation of this exam is limited in the absence of intravenous contrast. Lower chest: Diffuse interstitial coarsening and streaky atelectasis/scarring of the lung bases. There is mild cardiomegaly. There is hypoattenuation of the cardiac blood pool suggestive of a degree of anemia. Clinical correlation is recommended. There is pneumoperitoneum and small amount of free fluid within the pelvis. Hepatobiliary: Advanced fatty infiltration of the liver. No intrahepatic biliary  ductal dilatation. Cholecystectomy. Pancreas: Unremarkable. No pancreatic ductal dilatation or surrounding inflammatory changes. Spleen: Normal in size without focal abnormality. Adrenals/Urinary Tract: The adrenal glands are unremarkable. There is no hydronephrosis on either side. Excreted contrast in the renal collecting systems bilaterally. The visualized ureters appear unremarkable. There is trabeculated appearance of the bladder wall likely related to chronic bladder dysfunction. Correlation with urinalysis recommended to exclude cystitis. Stomach/Bowel: There is extensive sigmoid diverticulosis. There is associated inflammatory changes and perforation of the sigmoid colon. There is increased size of the extraluminal air since the prior CT. No drainable fluid collection is noted at this time. There is no bowel obstruction. Normal appendix. Vascular/Lymphatic: There is advanced aortoiliac atherosclerotic disease. No portal venous gas. There is no adenopathy. Reproductive: The uterus is grossly unremarkable. Other: None Musculoskeletal: Degenerative changes of the spine. No acute osseous pathology. IMPRESSION: 1. Perforated sigmoid diverticulitis with increased size of the extraluminal air since the prior CT. No drainable fluid collection noted at this time. 2. No bowel obstruction. Normal appendix. 3. Advanced fatty infiltration of the liver. These results were called by telephone at the time of interpretation on 04/06/2018 at 11:09 pm to nurse Hearn, who verbally acknowledged these results. Electronically Signed   By: Anner Crete M.D.   On: 04/06/2018 23:25   Dg Chest 2 View  Result Date: 04/06/2018 CLINICAL DATA:  Dyspnea, sepsis, pt listless and unable to cooperate EXAM: CHEST - 2 VIEW COMPARISON:  06/27/2016 and 06/18/2016. FINDINGS: On the lateral view, there is evidence of a small amount free intraperitoneal air under the left hemidiaphragm. Lung volumes are low. There is opacity at the bases  that is likely due to atelectasis along with bronchovascular crowding. No convincing pneumonia and no evidence of pulmonary edema. 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 Cardiac silhouette is normal in size. No mediastinal hilar masses. No pleural effusion.  No pneumothorax. Skeletal structures are grossly intact. IMPRESSION: 1. Small amount of free intraperitoneal air. Recommend follow-up abdomen and pelvis CT with  contrast for further assessment. 2. Somewhat limited study due to low lung volumes. Allowing for this, no acute cardiopulmonary disease. Electronically Signed   By: Lajean Manes M.D.   On: 04/06/2018 11:23   Ct Abdomen Pelvis W Contrast  Result Date: 04/05/2018 CLINICAL DATA:  Patient has bilateral lower abdominal sharp intermittent pain with nausea without vomiting. H/x diverticulitis patient states pain and symptoms similar to past. EXAM: CT ABDOMEN AND PELVIS WITH CONTRAST TECHNIQUE: Multidetector CT imaging of the abdomen and pelvis was performed using the standard protocol following bolus administration of intravenous contrast. CONTRAST:  34m ISOVUE-300 IOPAMIDOL (ISOVUE-300) INJECTION 61% COMPARISON:  02/06/2018 FINDINGS: Lower chest: There is scarring in both lung bases. Coronary artery calcifications are present. The heart is normal in size. Hepatobiliary: Liver is diffusely low attenuation. No focal liver lesions. Cholecystectomy. Pancreas: Unremarkable. No pancreatic ductal dilatation or surrounding inflammatory changes. Spleen: Normal in size without focal abnormality. Adrenals/Urinary Tract: Adrenal glands are normal. There is symmetric enhancement and excretion from the kidneys. 1 millimeter intrarenal calcification identified in the LOWER pole of the RIGHT kidney, not associated with obstruction. Ureters are unremarkable. The bladder and visualized portion of the urethra are normal. Stomach/Bowel: The stomach is normal in appearance. Small bowel loops are normal in caliber and wall  thickness. The appendix is well seen and has a normal appearance. There is significant inflammatory change in the region of sigmoid colon and associated numerous diverticula in this segment. Fluid extends in the sigmoid mesentery. The diseased segment of sigmoid is adjacent to the uterus and the urinary bladder. No evidence for abscess or perforation. Vascular/Lymphatic: There is atherosclerotic calcification of the abdominal aorta. There is normal vascular opacification of the celiac axis, superior mesenteric artery, and inferior mesenteric artery. Normal appearance of the portal venous system and inferior vena cava. Reproductive: The uterus is present and is surrounded by inflammatory changes from the sigmoid diverticulitis. No adnexal mass. Other: Trace amount of free pelvic fluid. Musculoskeletal: Incompletely imaged fluid attenuation collection within the LEFT hip abductors, measuring at least 7.3 x 4.8 centimeters. The appearance is stable since 2017. There are degenerative changes in the lumbar spine with 7 millimeters anterolisthesis of L4 on L5. Median sternotomy. IMPRESSION: 1. Acute sigmoid diverticulitis with significant fluid in the sigmoid mesentery. There is no evidence for perforation or abscess. 2. Coronary artery calcifications. 3. Hepatic steatosis.  Cholecystectomy. 4. Nonobstructing intrarenal calculus in the LOWER pole the RIGHT kidney. 5. Normal appendix. 6.  Aortic atherosclerosis. 7. Probably benign ganglion cyst within the LEFT hip abductors. Electronically Signed   By: ENolon NationsM.D.   On: 04/05/2018 22:06   UKoreaVenous Img Upper Uni Right  Result Date: 04/15/2018 CLINICAL DATA:  None EXAM: RIGHT UPPER EXTREMITY VENOUS DOPPLER ULTRASOUND TECHNIQUE: Gray-scale sonography with graded compression, as well as color Doppler and duplex ultrasound were performed to evaluate the upper extremity deep venous system from the level of the subclavian vein and including the jugular, axillary,  basilic, radial, ulnar and upper cephalic vein. Spectral Doppler was utilized to evaluate flow at rest and with distal augmentation maneuvers. COMPARISON:  None. FINDINGS: Contralateral Subclavian Vein: Respiratory phasicity is normal and symmetric with the symptomatic side. No evidence of thrombus. Normal compressibility. Internal Jugular Vein: No evidence of thrombus. Normal compressibility, respiratory phasicity and response to augmentation. Subclavian Vein: No evidence of thrombus. Normal compressibility, respiratory phasicity and response to augmentation. Axillary Vein: No evidence of thrombus. Normal compressibility, respiratory phasicity and response to augmentation. Cephalic Vein: No evidence  of thrombus. Normal compressibility, respiratory phasicity and response to augmentation. Basilic Vein: No evidence of thrombus. Normal compressibility, respiratory phasicity and response to augmentation. Brachial Veins: Occlusive thrombus of the right brachial vein, paired brachial veins. Radial Veins: No evidence of thrombus. Normal compressibility, respiratory phasicity and response to augmentation. Ulnar Veins: No evidence of thrombus. Normal compressibility, respiratory phasicity and response to augmentation. Other Findings:  Edema IMPRESSION: Sonographic survey of the right upper extremity is positive for occlusive DVT of the paired right brachial vein. Electronically Signed   By: Corrie Mckusick D.O.   On: 04/15/2018 14:17   US Arterial Abi (screening Lower Extremity)  Result Date: 03/19/2018 CLINICAL DATA:  72 year old female with chronic bilateral lower extremity pain EXAM: NONINVASIVE PHYSIOLOGIC VASCULAR STUDY OF BILATERAL LOWER EXTREMITIES TECHNIQUE: Evaluation of both lower extremities were performed at rest, including calculation of ankle-brachial indices with single level Doppler, pressure and pulse volume recording. COMPARISON:  None. FINDINGS: Right ABI:  1.2 Left ABI:  1.1 Right Lower Extremity:   Normal arterial waveforms at the ankle. Left Lower Extremity:  Normal arterial waveforms at the ankle. 1.0-1.4 Normal IMPRESSION: Normal examination. No evidence of hemodynamically significant peripheral arterial disease. Signed, Criselda Peaches, MD, [facility] Rad[facility] Electronically Signed   By: Jacqulynn Cadet M.D.   On: 03/19/2018 16:28   Dg Chest Port 1 View  Result Date: 04/11/2018 CLINICAL DATA:  PICC placement. EXAM: PORTABLE CHEST 1 VIEW COMPARISON:  Chest radiograph 04/06/2018. FINDINGS: Cardiomegaly.  Mild vascular congestion.  Prior CABG. RIGHT arm PICC line tip appears to lie distal SVC. This is marked with an arrow. No pneumothorax. IMPRESSION: RIGHT arm PICC line tip appears to lie distal SVC. No pneumothorax. Cardiomegaly with mild vascular congestion, stable from priors. Electronically Signed   By: Staci Righter M.D.   On: 04/11/2018 09:34   Ct Image Guided Fluid Drain By Catheter  Result Date: 04/16/2018 INDICATION: Pelvic abscess x2 EXAM: PELVIC ABSCESS DRAIN X2 CT-GUIDED MEDICATIONS: The patient is currently admitted to the hospital and receiving intravenous antibiotics. The antibiotics were administered within an appropriate time frame prior to the initiation of the procedure. ANESTHESIA/SEDATION: Fentanyl 0 mcg IV; Versed 4 mg IV, 4 mg morphine sulfate Moderate Sedation Time:  53 minutes The patient was continuously monitored during the procedure by the interventional radiology nurse under my direct supervision. COMPLICATIONS: None immediate. PROCEDURE: Informed written consent was obtained from the patient after a thorough discussion of the procedural risks, benefits and alternatives. All questions were addressed. Maximal Sterile Barrier Technique was utilized including caps, mask, sterile gowns, sterile gloves, sterile drape, hand hygiene and skin antiseptic. A timeout was performed prior to the initiation of the procedure.  In the prone position, the right gluteal region was prepped and draped in a sterile fashion. Under CT guidance, an 18 gauge needle was advanced into the pelvic abscess and removed over an Amplatz wire. Ten [place] dilator followed by a 10 [place] drain were inserted in the fluid collection. Pus was aspirated. The patient was placed supine. The lower abdomen was prepped and draped in a sterile fashion. 1% lidocaine was utilized for local anesthesia. Under CT guidance, an 18 gauge needle was inserted into the upper pelvic fluid collection and removed over an Amplatz wire. Ten [place] dilator followed by 10 [place] drain were inserted. Cloudy yellow fluid was aspirated. FINDINGS: Images demonstrate 4 French drain placement into the 2 fluid collections described above. IMPRESSION: Successful pelvic abscess drain x2 Electronically Signed  By: Marybelle Killings M.D.   On: 04/16/2018 16:01     CBC Recent Labs  Lab 04/13/18 0438 04/15/18 0859 04/16/18 0423 04/17/18 0443 04/18/18 0309  WBC 11.7* 8.9 8.4 7.4 8.5  HGB 10.5* 10.1* 9.3* 8.7* 8.8*  HCT 32.8* 31.3* 28.2* 27.3* 27.7*  PLT 290 205 172 188 202  MCV 94.5 92.1 90.1 90.1 89.9  MCH 30.3 29.7 29.7 28.7 28.6  MCHC 32.0 32.3 33.0 31.9 31.8  RDW 17.4* 17.0* 16.5* 16.5* 16.1*    Chemistries  Recent Labs  Lab 04/13/18 0438 04/15/18 0859 04/17/18 0443 04/18/18 0309  NA 141 139 137 138  K 4.5 3.6 3.1* 3.1*  CL 112* 104 100 99  CO2 24 26 28 28   GLUCOSE 109* 103* 78 106*  BUN 35* 22 21 15   CREATININE 0.77 0.83 1.08* 1.09*  CALCIUM 8.5* 8.3* 7.5* 7.3*  MG  --   --   --  0.8*  AST  --  69*  --   --   ALT  --  43  --   --   ALKPHOS  --  63  --   --   BILITOT  --  1.0  --   --    ------------------------------------------------------------------------------------------------------------------ No results for input(s): CHOL, HDL, LDLCALC, TRIG, CHOLHDL, LDLDIRECT in the last 72 hours.  Lab Results  Component Value Date   HGBA1C 5.9 (H) 04/07/2018     ------------------------------------------------------------------------------------------------------------------ No results for input(s): TSH, T4TOTAL, T3FREE, THYROIDAB in the last 72 hours.  Invalid input(s): FREET3 ------------------------------------------------------------------------------------------------------------------ No results for input(s): VITAMINB12, FOLATE, FERRITIN, TIBC, IRON, RETICCTPCT in the last 72 hours.  Coagulation profile No results for input(s): INR, PROTIME in the last 168 hours.  No results for input(s): DDIMER in the last 72 hours.  Cardiac Enzymes No results for input(s): CKMB, TROPONINI, MYOGLOBIN in the last 168 hours.  Invalid input(s): CK ------------------------------------------------------------------------------------------------------------------    Component Value Date/Time   BNP 34.0 06/14/2014 1415    Donat Humble MD [facility] pgr [phone] 11/03/2017, 9:25 AM  [facility] - Office  [phone]

## 2018-04-18 NOTE — Progress Notes (Signed)
Physical Therapy Treatment Patient Details Name: [name] MRN: [id number] DOB: 04/01/1946 Today's Date: 04/18/2018    History of Present Illness [name]  is a 72 y.o. female, w hypertension, hyperlipidemia, dm2, pvd, CVA, OSA, Rheumatoid arthritis apparently c/o LLQ pain over the past several weeks, just started on abx yesterday, pt notes subjective fever as well as nausea.  Pt denies emesis, constipation, diarrhea, brbpr, black stool.  Pt presented due to worsening abdominal pain today.     PT Comments    Pt is improving slowly, limited by diarrhea and generalized pain.  Pt has continued to be participative and ability to stand is coming along with use of STEDY.   Follow Up Recommendations  SNF     Equipment Recommendations  None recommended by PT    Recommendations for Other Services       Precautions / Restrictions Precautions Precautions: Fall    Mobility  Bed Mobility Overal bed mobility: Needs Assistance Bed Mobility: Supine to Sit     Supine to sit: Max assist     General bed mobility comments: cues for direction, assist up via R elbow.  Increase abdominal pain with coming up.  Transfers Overall transfer level: Needs assistance   Transfers: Sit to/from Stand Sit to Stand: Max assist         General transfer comment: Via STEDY, pt stood x3 in the STEDY  Used STEDY to transfer from bed to [facility] and [facility] to chair.  Ambulation/Gait                 Stairs             Wheelchair Mobility    Modified Rankin (Stroke Patients Only)       Balance Overall balance assessment: Needs assistance   Sitting balance-Leahy Scale: Fair       Standing balance-Leahy Scale: Poor Standing balance comment: worked on upright stance and w/shifting with controlled descent .  Limited by a but of diarrhea and faily extensive clean up and pericare.                            Cognition Arousal/Alertness: Awake/alert Behavior During  Therapy: WFL for tasks assessed/performed Overall Cognitive Status: Within Functional Limits for tasks assessed                                        Exercises Other Exercises Other Exercises: hip/knee AAROM flexion, resisted extension x5 reps bilaterally    General Comments        Pertinent Vitals/Pain Pain Assessment: Faces Faces Pain Scale: Hurts even more Pain Location: abdomen/stomach Pain Descriptors / Indicators: Cramping;Discomfort;Grimacing;Moaning(gassy) Pain Intervention(s): Monitored during session;Patient requesting pain meds-RN notified    Home Living                      Prior Function            PT Goals (current goals can now be found in the care plan section) Acute Rehab PT Goals Patient Stated Goal: return home PT Goal Formulation: With patient/family Time For Goal Achievement: 04/28/18 Potential to Achieve Goals: Fair Progress towards PT goals: Progressing toward goals    Frequency    Min 3X/week      PT Plan Current plan remains appropriate    Co-evaluation  AM-PAC PT "6 Clicks" Daily Activity  Outcome Measure  Difficulty turning over in bed (including adjusting bedclothes, sheets and blankets)?: Unable Difficulty moving from lying on back to sitting on the side of the bed? : Unable Difficulty sitting down on and standing up from a chair with arms (e.g., wheelchair, bedside commode, etc,.)?: Unable Help needed moving to and from a bed to chair (including a wheelchair)?: A Lot Help needed walking in hospital room?: Total Help needed climbing 3-5 steps with a railing? : Total 6 Click Score: 7    End of Session   Activity Tolerance: Patient tolerated treatment well;Patient limited by pain;Other (comment)(limited by diarrhea) Patient left: in chair;with call bell/phone within reach;with family/visitor present Nurse Communication: Mobility status PT Visit Diagnosis: Unsteadiness on feet  (R26.81);Muscle weakness (generalized) (M62.81)     Time: 1660-6004 PT Time Calculation (min) (ACUTE ONLY): 46 min  Charges:  $Therapeutic Activity: 38-52 mins                     04/18/2018  Donnella Sham, PT Acute Rehabilitation Services [phone]  (pager) [phone]  (office)   Tessie Fass Brave Dack 04/18/2018, 1:19 PM

## 2018-04-18 NOTE — Progress Notes (Signed)
CC: abdominal pain   Subjective: She is still pretty tender.  The drain on the left is clear serous fluid, she has had some purulent fluid, but it's pretty clear now.  The transgluteal drain is feculent appearing, a very dark brown speckled appearing fluid.  She remains very tender to palpation on the right and left sides.    Objective: Vital signs in last 24 hours: Temp:  [98.8 F (37.1 C)-100.2 F (37.9 C)] 98.8 F (37.1 C) (11/08 0432) Pulse Rate:  [80-155] 80 (11/08 0432) Resp:  [15-18] 15 (11/08 0432) BP: (100-147)/(55-71) 100/55 (11/08 0432) SpO2:  [91 %-97 %] 97 % (11/08 0432) Weight:  [123.5 kg] 123.5 kg (11/08 0440) Last BM Date: 04/17/18 580 PO 1500 IV 1600 urine Drain 75 Stool x 3 TM 100.2 VSS, BP down some  K+ 3.1/Mag 0.8 WBC 8.5 H/H stable  Intake/Output from previous day: 11/07 0701 - 11/08 0700 In: 2075.3 [P.O.:580; I.V.:1121.1; IV Piggyback:349.2] Out: 3846 [Urine:1600; Drains:75] Intake/Output this shift: No intake/output data recorded.  General appearance: alert, cooperative and no distress Resp: clear to auscultation bilaterally and anterior exam GI: tender right side on palpation, also tender around drain site on the left, on clears, + BS.  Lab Results:  Recent Labs    04/17/18 0443 04/18/18 0309  WBC 7.4 8.5  HGB 8.7* 8.8*  HCT 27.3* 27.7*  PLT 188 202    BMET Recent Labs    04/17/18 0443 04/18/18 0309  NA 137 138  K 3.1* 3.1*  CL 100 99  CO2 28 28  GLUCOSE 78 106*  BUN 21 15  CREATININE 1.08* 1.09*  CALCIUM 7.5* 7.3*   PT/INR No results for input(s): LABPROT, INR in the last 72 hours.  Recent Labs  Lab 04/15/18 0859  AST 69*  ALT 43  ALKPHOS 63  BILITOT 1.0  PROT 5.6*  ALBUMIN 2.1*     Lipase  No results found for: LIPASE   Medications: . Chlorhexidine Gluconate Cloth  6 each Topical Daily  . escitalopram  10 mg Oral QHS  . feeding supplement (ENSURE ENLIVE)  237 mL Oral TID BM  . furosemide  40 mg  Intravenous Daily  . latanoprost  1 drop Both Eyes QHS  . metoprolol tartrate  25 mg Oral BID  . nystatin   Topical TID  . pantoprazole  40 mg Oral Daily  . potassium chloride  40 mEq Oral Q4H  . pravastatin  40 mg Oral QHS  . sodium chloride flush  10-40 mL Intracatheter Q12H  . sodium chloride flush  5 mL Intracatheter Q8H  . timolol  1 drop Both Eyes BID   . heparin 1,500 Units/hr (04/17/18 1954)  . meropenem (MERREM) IV 1 g (04/18/18 0248)    Assessment/Plan  HTN HLD 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 - on plavix (last dose 10/27) H/o CVA  Morbidly obese BMI 44.6 Chronic pain/fibromyalgia/RA - Hydrocodone daily at home RUE DVT - on IV heparin Hypomagnesemia/hypokalemia - being replaced    Acute perforated sigmoid diverticulitis with multiple abscesses - CT scan 11/5 revealsat least 3 fluid collectionsin the lower abdomen/pelvis with previous perforated diverticulitis; fluid collections are7.3 x 5.8 cm in left lower quadrant, air-fluid collection measuring 8.2 x 5.9 cm in the right lower quadrant, and8.9 x 5.3 cm fluid collection in the pelvis in the pre rectal space which appears to contain stool and fluid - IR perc drains x 2  - 04/16/18 - Hopefully this will resolve without  surgical intervention. Recommend continue IV antibiotics. Will continue to follow.  ID -currently merrem 10/27>> VTE -SCDs, IV heparin FEN -IVF, advance to clears today, supplement with boost/ breeze Foley -she does have a wick, urine output has not been recorded. Follow up -TBD  Plan:  Continue abx and drains.  Pain may be hard to follow she is on Norco 10/325 QID at home as baseline pain med.  K+/Mag being replaced.  She currently is on daily heparin levels and CBC's   LOS: 11 days    Husain Costabile 04/18/2018 [phone]

## 2018-04-18 NOTE — Progress Notes (Signed)
Referring Physician(s): Dr Georgiann Cocker  Supervising Physician: Jacqulynn Cadet  Patient Status:  [name] - In-pt  Chief Complaint:  Pelvic abscess- diverticular  Subjective:  Drains x 2 placed 11/6 Pt still uncomfortable TG drain is painful OP is great Both with milky brown color OP   Allergies: Fentanyl; Lactose intolerance (gi); Butrans [buprenorphine]; Penicillins; and Simvastatin  Medications: Prior to Admission medications   Medication Sig Start Date End Date Taking? Authorizing Provider  Blood Glucose Monitoring Suppl (FREESTYLE LITE) DEVI See admin instructions. 12/22/14  Yes [provider]  ciprofloxacin (CIPRO) 500 MG/5ML (10%) suspension Take by mouth 2 (two) times daily.   Yes [provider]  clopidogrel (PLAVIX) 75 MG tablet Take 1 tablet (75 mg total) by mouth daily with breakfast. 01/14/18  Yes Rehman, Mechele Dawley, MD  Dexlansoprazole 30 MG capsule Take 40 mg by mouth daily.   Yes [provider]  diclofenac sodium (VOLTAREN) 1 % GEL Apply 2 g topically 4 (four) times daily as needed (Pain).    Yes [provider]  glipiZIDE (GLUCOTROL) 10 MG tablet Take 10 mg by mouth daily before breakfast.   Yes [provider]  HYDROcodone-acetaminophen (NORCO) 10-325 MG tablet Take 1 tablet by mouth 4 (four) times daily.   Yes [provider]  IRON PO Take 1 tablet by mouth daily.   Yes [provider]  isosorbide dinitrate (ISORDIL) 5 MG tablet Take 1 tablet (5 mg total) by mouth 3 (three) times daily before meals. 02/07/18  Yes Rehman, Mechele Dawley, MD  leflunomide (ARAVA) 20 MG tablet Take 20 mg by mouth daily.   Yes [provider]  metoprolol succinate (TOPROL-XL) 25 MG 24 hr tablet Take 25 mg by mouth daily.   Yes [provider]  metroNIDAZOLE (FLAGYL) 250 MG tablet Take 250 mg by mouth 3 (three) times daily.   Yes [provider]  Olmesartan-amLODIPine-HCTZ (TRIBENZOR) 40-5-25 MG TABS  Take 1 tablet by mouth daily.    Yes [provider]  pravastatin (PRAVACHOL) 40 MG tablet Take 40 mg by mouth at bedtime.   Yes [provider]  predniSONE (DELTASONE) 5 MG tablet Take 5 mg by mouth daily with breakfast.   Yes [provider]  pregabalin (LYRICA) 75 MG capsule Take 75 mg by mouth 3 (three) times daily.   Yes [provider]  Tafluprost (ZIOPTAN) 0.0015 % SOLN Place 1 drop into both eyes at bedtime.    Yes [provider]  temazepam (RESTORIL) 30 MG capsule Take 30 mg by mouth at bedtime.   Yes [provider]  timolol (BETIMOL) 0.5 % ophthalmic solution Place 1 drop into both eyes 2 (two) times daily.    Yes [provider]  Tofacitinib Citrate (XELJANZ) 5 MG TABS Take 1 tablet by mouth daily.   Yes [provider]  VITAMIN D, ERGOCALCIFEROL, PO Take 50,000 Units by mouth once a week. Thursdays   Yes [provider]  docusate sodium (COLACE) 100 MG capsule Take 2 capsules (200 mg total) by mouth at bedtime. Patient taking differently: Take 200 mg by mouth as needed.  02/13/18   Rogene Houston, MD     Vital Signs: BP (!) 100/55 (BP Location: Left Wrist)   Pulse 80   Temp 98.8 F (37.1 C) (Oral)   Resp 15   Ht 5' 5.5" (1.664 m)   Wt 272 lb 4.3 oz (123.5 kg)   SpO2 97%   BMI 44.62 kg/m  Physical Exam  Abdominal: Bowel sounds are decreased. There is generalized tenderness.  Skin: Skin is warm and dry.  Sites are clean and dry Tender to touch OP milky brown fluid from both  NGTD from ant drain Pelvic drain hi wbcs-- pending cx   Vitals reviewed.   Imaging: Ct Abdomen Pelvis Wo Contrast  Result Date: 04/15/2018 CLINICAL DATA:  Acute lower abdominal pain. EXAM: CT ABDOMEN AND PELVIS WITHOUT CONTRAST TECHNIQUE: Multidetector CT imaging of the abdomen and pelvis was performed following the standard protocol without IV contrast. COMPARISON:  CT scan of April 06, 2018. FINDINGS: Lower  chest: No acute abnormality. Hepatobiliary: No focal liver abnormality is seen. Status post cholecystectomy. No biliary dilatation. Pancreas: Unremarkable. No pancreatic ductal dilatation or surrounding inflammatory changes. Spleen: Normal in size without focal abnormality. Adrenals/Urinary Tract: Adrenal glands appear normal. Small nonobstructive right renal calculus is noted. No hydronephrosis or renal obstruction is noted. Urinary bladder is unremarkable. Stomach/Bowel: The stomach and appendix are unremarkable. Sigmoid diverticulosis is noted. Some degree of residual diverticulitis cannot be excluded. However, 7.3 x 5.8 cm fluid collection is noted in left lower quadrant, which communicates with smaller fluid collection measuring 5.1 x 3.6 cm. This is concerning for abscess status post previous perforated diverticulitis. Air-fluid collection measuring 8.2 x 5.9 cm is seen in the right lower quadrant also concerning for possible abscess. 8.9 x 5.3 cm fluid and stool collection is seen posterior to the uterus and anterior to the rectum concerning for abscess or contained perforation. Vascular/Lymphatic: Aortic atherosclerosis. No enlarged abdominal or pelvic lymph nodes. Reproductive: Uterus and bilateral adnexa are unremarkable. Other: Stable 6.9 x 4.5 cm fluid collection in left inguinal region. This most likely is benign. Musculoskeletal: No acute or significant osseous findings. IMPRESSION: There are at least 3 fluid collections and probable abscesses seen in the lower abdomen and pelvis, following previously perforated diverticulitis. 7.3 x 5.8 cm fluid collection is noted in left lower quadrant, as well as air-fluid collection measuring 8.2 x 5.9 cm seen in the right lower quadrant. Also noted is 8.9 x 5.3 cm fluid collection in the pelvis in the pre rectal space which appears to contain stool and fluid. These results will be called to the ordering clinician or representative by the Radiologist Assistant,  and communication documented in the PACS or zVision Dashboard. Small nonobstructive right renal calculus. Aortic Atherosclerosis (ICD10-I70.0). Electronically Signed   By: Marijo Conception, M.D.   On: 04/15/2018 14:10   US Venous Img Upper Uni Right  Result Date: 04/15/2018 CLINICAL DATA:  None EXAM: RIGHT UPPER EXTREMITY VENOUS DOPPLER ULTRASOUND TECHNIQUE: Gray-scale sonography with graded compression, as well as color Doppler and duplex ultrasound were performed to evaluate the upper extremity deep venous system from the level of the subclavian vein and including the jugular, axillary, basilic, radial, ulnar and upper cephalic vein. Spectral Doppler was utilized to evaluate flow at rest and with distal augmentation maneuvers. COMPARISON:  None. FINDINGS: Contralateral Subclavian Vein: Respiratory phasicity is normal and symmetric with the symptomatic side. No evidence of thrombus. Normal compressibility. Internal Jugular Vein: No evidence of thrombus. Normal compressibility, respiratory phasicity and response to augmentation. Subclavian Vein: No evidence of thrombus. Normal compressibility, respiratory phasicity and response to augmentation. Axillary Vein: No evidence of thrombus. Normal compressibility, respiratory phasicity and response to augmentation. Cephalic Vein: No evidence of thrombus. Normal compressibility, respiratory phasicity and response to augmentation. Basilic Vein: No evidence of thrombus. Normal compressibility, respiratory phasicity and response to augmentation. Brachial Veins: Occlusive thrombus  of the right brachial vein, paired brachial veins. Radial Veins: No evidence of thrombus. Normal compressibility, respiratory phasicity and response to augmentation. Ulnar Veins: No evidence of thrombus. Normal compressibility, respiratory phasicity and response to augmentation. Other Findings:  Edema IMPRESSION: Sonographic survey of the right upper extremity is positive for occlusive DVT of the  paired right brachial vein. Electronically Signed   By: Corrie Mckusick D.O.   On: 04/15/2018 14:17   Ct Image Guided Fluid Drain By Catheter  Result Date: 04/16/2018 INDICATION: Pelvic abscess x2 EXAM: PELVIC ABSCESS DRAIN X2 CT-GUIDED MEDICATIONS: The patient is currently admitted to the hospital and receiving intravenous antibiotics. The antibiotics were administered within an appropriate time frame prior to the initiation of the procedure. ANESTHESIA/SEDATION: Fentanyl 0 mcg IV; Versed 4 mg IV, 4 mg morphine sulfate Moderate Sedation Time:  53 minutes The patient was continuously monitored during the procedure by the interventional radiology nurse under my direct supervision. COMPLICATIONS: None immediate. PROCEDURE: Informed written consent was obtained from the patient after a thorough discussion of the procedural risks, benefits and alternatives. All questions were addressed. Maximal Sterile Barrier Technique was utilized including caps, mask, sterile gowns, sterile gloves, sterile drape, hand hygiene and skin antiseptic. A timeout was performed prior to the initiation of the procedure. In the prone position, the right gluteal region was prepped and draped in a sterile fashion. Under CT guidance, an 18 gauge needle was advanced into the pelvic abscess and removed over an Amplatz wire. Ten [place] dilator followed by a 10 [place] drain were inserted in the fluid collection. Pus was aspirated. The patient was placed supine. The lower abdomen was prepped and draped in a sterile fashion. 1% lidocaine was utilized for local anesthesia. Under CT guidance, an 18 gauge needle was inserted into the upper pelvic fluid collection and removed over an Amplatz wire. Ten [place] dilator followed by 10 [place] drain were inserted. Cloudy yellow fluid was aspirated. FINDINGS: Images demonstrate 90 French drain placement into the 2 fluid collections described above. IMPRESSION: Successful pelvic abscess drain x2 Electronically  Signed   By: Marybelle Killings M.D.   On: 04/16/2018 16:01    Labs:  CBC: Recent Labs    04/15/18 0859 04/16/18 0423 04/17/18 0443 04/18/18 0309  WBC 8.9 8.4 7.4 8.5  HGB 10.1* 9.3* 8.7* 8.8*  HCT 31.3* 28.2* 27.3* 27.7*  PLT 205 172 188 202    COAGS: Recent Labs    04/06/18 1952  INR 1.22  APTT 33    BMP: Recent Labs    04/13/18 0438 04/15/18 0859 04/17/18 0443 04/18/18 0309  NA 141 139 137 138  K 4.5 3.6 3.1* 3.1*  CL 112* 104 100 99  CO2 24 26 28 28   GLUCOSE 109* 103* 78 106*  BUN 35* 22 21 15   CALCIUM 8.5* 8.3* 7.5* 7.3*  CREATININE 0.77 0.83 1.08* 1.09*  GFRNONAA >60 >60 50* 49*  GFRAA >60 >60 58* 57*    LIVER FUNCTION TESTS: Recent Labs    04/08/18 0415 04/09/18 0355 04/10/18 0351 04/15/18 0859  BILITOT 0.6 0.5 0.7 1.0  AST 46* 54* 41 69*  ALT 33 36 30 43  ALKPHOS 48 55 51 63  PROT 5.6* 5.2* 5.5* 5.6*  ALBUMIN 2.3* 2.2* 2.2* 2.1*    Assessment and Plan:  Pelvic abscess Drain placed x 2 in IR 11/6 Feeling minimally better Will follow   Electronically Signed: Jolyne Laye A, PA-C 04/18/2018, 10:40 AM   I spent a total of 15 Minutes at  the the patient's bedside AND on the patient's hospital floor or unit, greater than 50% of which was counseling/coordinating care for abscess drains

## 2018-04-19 LAB — MAGNESIUM: MAGNESIUM: 1.5 mg/dL — AB (ref 1.7–2.4)

## 2018-04-19 LAB — CBC
HEMATOCRIT: 27.1 % — AB (ref 36.0–46.0)
Hemoglobin: 9 g/dL — ABNORMAL LOW (ref 12.0–15.0)
MCH: 29.6 pg (ref 26.0–34.0)
MCHC: 33.2 g/dL (ref 30.0–36.0)
MCV: 89.1 fL (ref 80.0–100.0)
Platelets: 239 10*3/uL (ref 150–400)
RBC: 3.04 MIL/uL — ABNORMAL LOW (ref 3.87–5.11)
RDW: 16.4 % — AB (ref 11.5–15.5)
WBC: 7.2 10*3/uL (ref 4.0–10.5)
nRBC: 0 % (ref 0.0–0.2)

## 2018-04-19 LAB — BASIC METABOLIC PANEL
Anion gap: 8 (ref 5–15)
BUN: 20 mg/dL (ref 8–23)
CHLORIDE: 100 mmol/L (ref 98–111)
CO2: 30 mmol/L (ref 22–32)
Calcium: 7.4 mg/dL — ABNORMAL LOW (ref 8.9–10.3)
Creatinine, Ser: 1.07 mg/dL — ABNORMAL HIGH (ref 0.44–1.00)
GFR, EST AFRICAN AMERICAN: 58 mL/min — AB (ref >60)
GFR, EST NON AFRICAN AMERICAN: 50 mL/min — AB (ref >60)
Glucose, Bld: 96 mg/dL (ref 70–99)
Potassium: 3.5 mmol/L (ref 3.5–5.1)
Sodium: 138 mmol/L (ref 135–145)

## 2018-04-19 LAB — GLUCOSE, CAPILLARY
GLUCOSE-CAPILLARY: 104 mg/dL — AB (ref 70–99)
GLUCOSE-CAPILLARY: 133 mg/dL — AB (ref 70–99)
GLUCOSE-CAPILLARY: 112 mg/dL — AB (ref 70–99)
Glucose-Capillary: 88 mg/dL (ref 70–99)
Glucose-Capillary: 91 mg/dL (ref 70–99)
Glucose-Capillary: 98 mg/dL (ref 70–99)

## 2018-04-19 LAB — HEPARIN LEVEL (UNFRACTIONATED): Heparin Unfractionated: 0.51 IU/mL (ref 0.30–0.70)

## 2018-04-19 MED ORDER — MAGNESIUM SULFATE 2 GM/50ML IV SOLN
2.0000 g | Freq: Once | INTRAVENOUS | Status: AC
  Administered 2018-04-19: 2 g via INTRAVENOUS
  Filled 2018-04-19: qty 50

## 2018-04-19 MED ORDER — POTASSIUM CHLORIDE CRYS ER 20 MEQ PO TBCR
40.0000 meq | EXTENDED_RELEASE_TABLET | ORAL | Status: AC
  Administered 2018-04-19 (×2): 40 meq via ORAL
  Filled 2018-04-19 (×2): qty 2

## 2018-04-19 NOTE — Progress Notes (Signed)
Patient Demographics:    [name], is a 72 y.o. female, DOB - 01-18-1946, WTU:[id number]  Admit date - 04/05/2018   Admitting Physician [name], MD  Outpatient Primary MD for the patient is [name], MD  LOS - 12   Chief Complaint  Patient presents with  . Abdominal Pain      Brief narrative:  72yo female PMH HTN, DM-II, CKD, RA, H/o CAD s/p CABG (on plavix,), who was admitted to [facility] 10/26 with acute sigmoid diverticulitis without perforation or abscess. Initially felt to be improving on IV merrem, but she continued to have abdominal pain, CT abdomen pelvis was obtained 04/15/2018, was significant for worsening diverticulitis now with multiple abdominal fluid collections.  Patient developed acute right upper extremity DVT in the setting of PICC line, she was transferred from [facility] 04/15/2018 for evaluation by IR.  She has drain placed by IR 04/16/2018   Subjective:    [name] denies any fever or chills overnight, there is significantly subsided, abdominal pain has improved as well, she was able to sit in the recliner 3-1/2 hours yesterday .   Assessment  & P lan :    Principal Problem:   Diverticulitis Active Problems:   DM (diabetes mellitus) ([place])   HTN (hypertension)   Rheumatoid arthritis ([place])   Anemia   Renal insufficiency   Sepsis due to undetermined organism ([place])   CKD (chronic kidney disease) stage 3, GFR 30-59 ml/min (HCC)   Fibromyalgia   Diverticulitis large intestine w/o perforation or abscess w/o bleeding   Acute diverticulitis   Perforated sigmoid colon (HCC)   Acute renal failure superimposed on stage 3 chronic kidney disease ([place])   Sepsis due to acute perforated sigmoid diverticulitis- -sepsis criteria met on admission - imaging 04/06/2018 on admission significant for perforated sigmoid diverticulitis, was managed conservatively on IV  meropenem, clear liquid diet, . -Repeat imaging 04/15/2018 with large abscesses in the abdomen, transferred to [facility] for further care, general surgery consult greatly appreciated. -IR consult appreciated, status post drainage x2, for her diverticular abscess, with with drain, Gram stain showing gram-positive cocci, gram-positive rods as well, she is on meropenem, Gram stain showing multiple gram-positive species, her final culture showing multiple organisms, none predominant, with no anaerobes isolated, so we will continue with meropenem for now.  Acute right upper extremity DVT -In the setting of PICC line insertion, start on anticoagulation, continue with heparin for anticoagulation until no surgery or further procedure is indicated. -PICC line  discontinued 04/16/2018  acute kidney injury on CKD3. Baseline creat 1.2.   - AKI resolved w/ IVF but creat down to 0.7 now, appears to be volume overloaded, continue with diuresis as blood pressure tolerates.  Hypertension -Continue with metoprolol, given soft blood pressure continue to hold home medication including as  isosorbide/ olmesartan/amlodipine/HCTZ combo -Patient's blood pressure has improved, within normal limit, so no need to resume any antihypertensive meds right now, as I will continue with IV diuresis, as so far she is +20 L during this hospital stay.   DM2 -   BS's are good, cont Novolog/Humalog Sliding scale insulin  H/o CAD sp CABG /hx TIA - previously on Plavix and pravastatin, continue to hold Plavix may  need further procedures or surgery  Debility - needs to get OOB , PT seeing patient , SNF recommended, SW consulted   acute on chronic anemia--- sp 2 u prbc's,   Started on Lexapro for depression  hypoKalemia -repleted,  cont to return especially if she is on diuresis  Code Status : full  Disposition Plan  : TBD, will prob need SNF  Consults  :  Gen Surgery, IR  DVT Prophylaxis  :   SCDs, heparin GTT   Lab  Results  Component Value Date   PLT 239 04/19/2018    Inpatient Medications  Scheduled Meds: . Chlorhexidine Gluconate Cloth  6 each Topical Daily  . escitalopram  10 mg Oral QHS  . feeding supplement (ENSURE ENLIVE)  237 mL Oral TID BM  . feeding supplement (GLUCERNA SHAKE)  237 mL Oral TID BM  . furosemide  40 mg Intravenous Daily  . latanoprost  1 drop Both Eyes QHS  . metoprolol tartrate  25 mg Oral BID  . nystatin   Topical TID  . pantoprazole  40 mg Oral Daily  . potassium chloride  40 mEq Oral Q4H  . pravastatin  40 mg Oral QHS  . sodium chloride flush  10-40 mL Intracatheter Q12H  . sodium chloride flush  5 mL Intracatheter Q8H  . temazepam  30 mg Oral Once  . timolol  1 drop Both Eyes BID   Continuous Infusions: . heparin 1,500 Units/hr (04/19/18 0421)  . magnesium sulfate 1 - 4 g bolus IVPB    . meropenem (MERREM) IV 1 g (04/19/18 1027)   PRN Meds:.acetaminophen **OR** acetaminophen, albuterol, hydrALAZINE, HYDROcodone-acetaminophen, ondansetron (ZOFRAN) IV, oxyCODONE, senna-docusate, sodium chloride flush    Anti-infectives (From admission, onward)   Start     Dose/Rate Route Frequency Ordered Stop   04/15/18 1700  meropenem (MERREM) 1 g in sodium chloride 0.9 % 100 mL IVPB     1 g 200 mL/hr over 30 Minutes Intravenous Every 8 hours 04/15/18 1534     04/09/18 2200  meropenem (MERREM) 1 g in sodium chloride 0.9 % 100 mL IVPB  Status:  Discontinued     1 g 200 mL/hr over 30 Minutes Intravenous Every 8 hours 04/09/18 1244 04/14/18 0944   04/06/18 1300  meropenem (MERREM) 1 g in sodium chloride 0.9 % 100 mL IVPB  Status:  Discontinued     1 g 200 mL/hr over 30 Minutes Intravenous Every 12 hours 04/06/18 1145 04/09/18 1244   04/06/18 1145  meropenem (MERREM) 1 g in sodium chloride 0.9 % 100 mL IVPB  Status:  Discontinued     1 g 200 mL/hr over 30 Minutes Intravenous Every 8 hours 04/06/18 1139 04/06/18 1145   04/06/18 0900  ciprofloxacin (CIPRO) IVPB 400 mg   Status:  Discontinued     400 mg 200 mL/hr over 60 Minutes Intravenous Every 12 hours 04/06/18 0732 04/06/18 1139   04/06/18 0300  metroNIDAZOLE (FLAGYL) IVPB 500 mg  Status:  Discontinued     500 mg 100 mL/hr over 60 Minutes Intravenous Every 8 hours 04/05/18 2335 04/06/18 1139   04/05/18 1930  ciprofloxacin (CIPRO) IVPB 400 mg     400 mg 200 mL/hr over 60 Minutes Intravenous  Once 04/05/18 1925 04/05/18 2217   04/05/18 1930  metroNIDAZOLE (FLAGYL) IVPB 500 mg     500 mg 100 mL/hr over 60 Minutes Intravenous  Once 04/05/18 1925 04/05/18 2102        Objective:   Vitals:  04/18/18 2056 04/19/18 0355 04/19/18 0500 04/19/18 1504  BP: (!) 116/59 (!) 136/58  122/62  Pulse: 88 83  82  Resp: _0 Temp: 98.4 F (36.9 C) 98.5 F (36.9 C)  98.1 F (36.7 C)  TempSrc: Oral Oral  Oral  SpO2: 94% 94%  95%  Weight:   123 kg   Height:        Wt Readings from Last 3 Encounters:  04/19/18 123 kg  02/13/18 116.1 kg  01/13/18 120.7 kg     Intake/Output Summary (Last 24 hours) at 04/19/2018 1544 Last data filed at 04/19/2018 1421 Gross per 24 hour  Intake 756.31 ml  Output 520 ml  Net 236.31 ml     Physical Exam  Awake Alert, Oriented X 3, No new F.N deficits, Normal affect Symmetrical Chest wall movement, Good air movement bilaterally, CTAB RRR,No Gallops,Rubs or new Murmurs, No Parasternal Heave +ve B.Sounds, Abd Soft, mild tenderness, has abdominal drain anteriorly, and another one posteriorly, No rebound - guarding or rigidity. No Cyanosis, Clubbing , lower extremity edema significantly improved, still with significant right upper extremity edema, but it is improving as well       Data Review:   Micro Results Recent Results (from the past 240 hour(s))  Aerobic/Anaerobic Culture (surgical/deep wound)     Status: Abnormal (Preliminary result)   Collection Time: 04/16/18  3:13 PM  Result Value Ref Range Status   Specimen Description PELVIS ABSCESS  Final   Special  Requests NONE  Final   Gram Stain   Final    FEW WBC PRESENT,BOTH PMN AND MONONUCLEAR FEW GRAM POSITIVE COCCI IN CLUSTERS FEW GRAM POSITIVE RODS Performed at [facility], [place] [address], [place], [place] [address]    Culture (A)  Final    MULTIPLE ORGANISMS PRESENT, NONE PREDOMINANT NO ANAEROBES ISOLATED; CULTURE IN PROGRESS FOR 5 DAYS    Report Status PENDING  Incomplete  Aerobic/Anaerobic Culture (surgical/deep wound)     Status: None (Preliminary result)   Collection Time: 04/16/18  3:13 PM  Result Value Ref Range Status   Specimen Description ABSCESS PELVIS  Final   Special Requests SAMPLE NO 2  Final   Gram Stain   Final    FEW WBC PRESENT,BOTH PMN AND MONONUCLEAR NO ORGANISMS SEEN    Culture   Final    NO GROWTH 3 DAYS Performed at [facility], [place] [address] [place], [place] [address]    Report Status PENDING  Incomplete    Radiology Reports Ct Abdomen Pelvis Wo Contrast  Result Date: 04/15/2018 CLINICAL DATA:  Acute lower abdominal pain. EXAM: CT ABDOMEN AND PELVIS WITHOUT CONTRAST TECHNIQUE: Multidetector CT imaging of the abdomen and pelvis was performed following the standard protocol without IV contrast. COMPARISON:  CT scan of April 06, 2018. FINDINGS: Lower chest: No acute abnormality. Hepatobiliary: No focal liver abnormality is seen. Status post cholecystectomy. No biliary dilatation. Pancreas: Unremarkable. No pancreatic ductal dilatation or surrounding inflammatory changes. Spleen: Normal in size without focal abnormality. Adrenals/Urinary Tract: Adrenal glands appear normal. Small nonobstructive right renal calculus is noted. No hydronephrosis or renal obstruction is noted. Urinary bladder is unremarkable. Stomach/Bowel: The stomach and appendix are unremarkable. Sigmoid diverticulosis is noted. Some degree of residual diverticulitis cannot be excluded. However, 7.3 x 5.8 cm fluid collection is noted in left lower quadrant, which communicates with  smaller fluid collection measuring 5.1 x 3.6 cm. This is concerning for abscess status post previous perforated diverticulitis. Air-fluid  collection measuring 8.2 x 5.9 cm is seen in the right lower quadrant also concerning for possible abscess. 8.9 x 5.3 cm fluid and stool collection is seen posterior to the uterus and anterior to the rectum concerning for abscess or contained perforation. Vascular/Lymphatic: Aortic atherosclerosis. No enlarged abdominal or pelvic lymph nodes. Reproductive: Uterus and bilateral adnexa are unremarkable. Other: Stable 6.9 x 4.5 cm fluid collection in left inguinal region. This most likely is benign. Musculoskeletal: No acute or significant osseous findings. IMPRESSION: There are at least 3 fluid collections and probable abscesses seen in the lower abdomen and pelvis, following previously perforated diverticulitis. 7.3 x 5.8 cm fluid collection is noted in left lower quadrant, as well as air-fluid collection measuring 8.2 x 5.9 cm seen in the right lower quadrant. Also noted is 8.9 x 5.3 cm fluid collection in the pelvis in the pre rectal space which appears to contain stool and fluid. These results will be called to the ordering clinician or representative by the Radiologist Assistant, and communication documented in the PACS or zVision Dashboard. Small nonobstructive right renal calculus. Aortic Atherosclerosis (ICD10-I70.0). Electronically Signed   By: Marijo Conception, M.D.   On: 04/15/2018 14:10   Ct Abdomen Pelvis Wo Contrast  Result Date: 04/06/2018 CLINICAL DATA:  72 year old female with concern for perforated diverticulitis. EXAM: CT ABDOMEN AND PELVIS WITHOUT CONTRAST TECHNIQUE: Multidetector CT imaging of the abdomen and pelvis was performed following the standard protocol without IV contrast. COMPARISON:  Chest radiograph dated 04/06/2018 and CT dated 04/05/2018 FINDINGS: Evaluation of this exam is limited in the absence of intravenous contrast. Lower chest: Diffuse  interstitial coarsening and streaky atelectasis/scarring of the lung bases. There is mild cardiomegaly. There is hypoattenuation of the cardiac blood pool suggestive of a degree of anemia. Clinical correlation is recommended. There is pneumoperitoneum and small amount of free fluid within the pelvis. Hepatobiliary: Advanced fatty infiltration of the liver. No intrahepatic biliary ductal dilatation. Cholecystectomy. Pancreas: Unremarkable. No pancreatic ductal dilatation or surrounding inflammatory changes. Spleen: Normal in size without focal abnormality. Adrenals/Urinary Tract: The adrenal glands are unremarkable. There is no hydronephrosis on either side. Excreted contrast in the renal collecting systems bilaterally. The visualized ureters appear unremarkable. There is trabeculated appearance of the bladder wall likely related to chronic bladder dysfunction. Correlation with urinalysis recommended to exclude cystitis. Stomach/Bowel: There is extensive sigmoid diverticulosis. There is associated inflammatory changes and perforation of the sigmoid colon. There is increased size of the extraluminal air since the prior CT. No drainable fluid collection is noted at this time. There is no bowel obstruction. Normal appendix. Vascular/Lymphatic: There is advanced aortoiliac atherosclerotic disease. No portal venous gas. There is no adenopathy. Reproductive: The uterus is grossly unremarkable. Other: None Musculoskeletal: Degenerative changes of the spine. No acute osseous pathology. IMPRESSION: 1. Perforated sigmoid diverticulitis with increased size of the extraluminal air since the prior CT. No drainable fluid collection noted at this time. 2. No bowel obstruction. Normal appendix. 3. Advanced fatty infiltration of the liver. These results were called by telephone at the time of interpretation on 04/06/2018 at 11:09 pm to nurse Hearn, who verbally acknowledged these results. Electronically Signed   By: Anner Crete  M.D.   On: 04/06/2018 23:25   Dg Chest 2 View  Result Date: 04/06/2018 CLINICAL DATA:  Dyspnea, sepsis, pt listless and unable to cooperate EXAM: CHEST - 2 VIEW COMPARISON:  06/27/2016 and 06/18/2016. FINDINGS: On the lateral view, there is evidence of a small amount free intraperitoneal air under  the left hemidiaphragm. Lung volumes are low. There is opacity at the bases that is likely due to atelectasis along with bronchovascular crowding. No convincing pneumonia and no evidence of pulmonary edema. 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 Cardiac silhouette is normal in size. No mediastinal hilar masses. No pleural effusion.  No pneumothorax. Skeletal structures are grossly intact. IMPRESSION: 1. Small amount of free intraperitoneal air. Recommend follow-up abdomen and pelvis CT with contrast for further assessment. 2. Somewhat limited study due to low lung volumes. Allowing for this, no acute cardiopulmonary disease. Electronically Signed   By: Lajean Manes M.D.   On: 04/06/2018 11:23   Ct Abdomen Pelvis W Contrast  Result Date: 04/05/2018 CLINICAL DATA:  Patient has bilateral lower abdominal sharp intermittent pain with nausea without vomiting. H/x diverticulitis patient states pain and symptoms similar to past. EXAM: CT ABDOMEN AND PELVIS WITH CONTRAST TECHNIQUE: Multidetector CT imaging of the abdomen and pelvis was performed using the standard protocol following bolus administration of intravenous contrast. CONTRAST:  72m ISOVUE-300 IOPAMIDOL (ISOVUE-300) INJECTION 61% COMPARISON:  02/06/2018 FINDINGS: Lower chest: There is scarring in both lung bases. Coronary artery calcifications are present. The heart is normal in size. Hepatobiliary: Liver is diffusely low attenuation. No focal liver lesions. Cholecystectomy. Pancreas: Unremarkable. No pancreatic ductal dilatation or surrounding inflammatory changes. Spleen: Normal in size without focal abnormality. Adrenals/Urinary Tract: Adrenal glands are  normal. There is symmetric enhancement and excretion from the kidneys. 1 millimeter intrarenal calcification identified in the LOWER pole of the RIGHT kidney, not associated with obstruction. Ureters are unremarkable. The bladder and visualized portion of the urethra are normal. Stomach/Bowel: The stomach is normal in appearance. Small bowel loops are normal in caliber and wall thickness. The appendix is well seen and has a normal appearance. There is significant inflammatory change in the region of sigmoid colon and associated numerous diverticula in this segment. Fluid extends in the sigmoid mesentery. The diseased segment of sigmoid is adjacent to the uterus and the urinary bladder. No evidence for abscess or perforation. Vascular/Lymphatic: There is atherosclerotic calcification of the abdominal aorta. There is normal vascular opacification of the celiac axis, superior mesenteric artery, and inferior mesenteric artery. Normal appearance of the portal venous system and inferior vena cava. Reproductive: The uterus is present and is surrounded by inflammatory changes from the sigmoid diverticulitis. No adnexal mass. Other: Trace amount of free pelvic fluid. Musculoskeletal: Incompletely imaged fluid attenuation collection within the LEFT hip abductors, measuring at least 7.3 x 4.8 centimeters. The appearance is stable since 2017. There are degenerative changes in the lumbar spine with 7 millimeters anterolisthesis of L4 on L5. Median sternotomy. IMPRESSION: 1. Acute sigmoid diverticulitis with significant fluid in the sigmoid mesentery. There is no evidence for perforation or abscess. 2. Coronary artery calcifications. 3. Hepatic steatosis.  Cholecystectomy. 4. Nonobstructing intrarenal calculus in the LOWER pole the RIGHT kidney. 5. Normal appendix. 6.  Aortic atherosclerosis. 7. Probably benign ganglion cyst within the LEFT hip abductors. Electronically Signed   By: ENolon NationsM.D.   On: 04/05/2018 22:06    UKoreaVenous Img Upper Uni Right  Result Date: 04/15/2018 CLINICAL DATA:  None EXAM: RIGHT UPPER EXTREMITY VENOUS DOPPLER ULTRASOUND TECHNIQUE: Gray-scale sonography with graded compression, as well as color Doppler and duplex ultrasound were performed to evaluate the upper extremity deep venous system from the level of the subclavian vein and including the jugular, axillary, basilic, radial, ulnar and upper cephalic vein. Spectral Doppler was utilized to evaluate flow at rest and  with distal augmentation maneuvers. COMPARISON:  None. FINDINGS: Contralateral Subclavian Vein: Respiratory phasicity is normal and symmetric with the symptomatic side. No evidence of thrombus. Normal compressibility. Internal Jugular Vein: No evidence of thrombus. Normal compressibility, respiratory phasicity and response to augmentation. Subclavian Vein: No evidence of thrombus. Normal compressibility, respiratory phasicity and response to augmentation. Axillary Vein: No evidence of thrombus. Normal compressibility, respiratory phasicity and response to augmentation. Cephalic Vein: No evidence of thrombus. Normal compressibility, respiratory phasicity and response to augmentation. Basilic Vein: No evidence of thrombus. Normal compressibility, respiratory phasicity and response to augmentation. Brachial Veins: Occlusive thrombus of the right brachial vein, paired brachial veins. Radial Veins: No evidence of thrombus. Normal compressibility, respiratory phasicity and response to augmentation. Ulnar Veins: No evidence of thrombus. Normal compressibility, respiratory phasicity and response to augmentation. Other Findings:  Edema IMPRESSION: Sonographic survey of the right upper extremity is positive for occlusive DVT of the paired right brachial vein. Electronically Signed   By: Corrie Mckusick D.O.   On: 04/15/2018 14:17   Dg Chest Port 1 View  Result Date: 04/11/2018 CLINICAL DATA:  PICC placement. EXAM: PORTABLE CHEST 1 VIEW  COMPARISON:  Chest radiograph 04/06/2018. FINDINGS: Cardiomegaly.  Mild vascular congestion.  Prior CABG. RIGHT arm PICC line tip appears to lie distal SVC. This is marked with an arrow. No pneumothorax. IMPRESSION: RIGHT arm PICC line tip appears to lie distal SVC. No pneumothorax. Cardiomegaly with mild vascular congestion, stable from priors. Electronically Signed   By: Staci Righter M.D.   On: 04/11/2018 09:34   Ct Image Guided Fluid Drain By Catheter  Result Date: 04/16/2018 INDICATION: Pelvic abscess x2 EXAM: PELVIC ABSCESS DRAIN X2 CT-GUIDED MEDICATIONS: The patient is currently admitted to the hospital and receiving intravenous antibiotics. The antibiotics were administered within an appropriate time frame prior to the initiation of the procedure. ANESTHESIA/SEDATION: Fentanyl 0 mcg IV; Versed 4 mg IV, 4 mg morphine sulfate Moderate Sedation Time:  53 minutes The patient was continuously monitored during the procedure by the interventional radiology nurse under my direct supervision. COMPLICATIONS: None immediate. PROCEDURE: Informed written consent was obtained from the patient after a thorough discussion of the procedural risks, benefits and alternatives. All questions were addressed. Maximal Sterile Barrier Technique was utilized including caps, mask, sterile gowns, sterile gloves, sterile drape, hand hygiene and skin antiseptic. A timeout was performed prior to the initiation of the procedure. In the prone position, the right gluteal region was prepped and draped in a sterile fashion. Under CT guidance, an 18 gauge needle was advanced into the pelvic abscess and removed over an Amplatz wire. Ten [place] dilator followed by a 10 [place] drain were inserted in the fluid collection. Pus was aspirated. The patient was placed supine. The lower abdomen was prepped and draped in a sterile fashion. 1% lidocaine was utilized for local anesthesia. Under CT guidance, an 18 gauge needle was inserted into the upper  pelvic fluid collection and removed over an Amplatz wire. Ten [place] dilator followed by 10 [place] drain were inserted. Cloudy yellow fluid was aspirated. FINDINGS: Images demonstrate 23 French drain placement into the 2 fluid collections described above. IMPRESSION: Successful pelvic abscess drain x2 Electronically Signed   By: Marybelle Killings M.D.   On: 04/16/2018 16:01     CBC Recent Labs  Lab 04/15/18 0859 04/16/18 0423 04/17/18 0443 04/18/18 0309 04/19/18 0419  WBC 8.9 8.4 7.4 8.5 7.2  HGB 10.1* 9.3* 8.7* 8.8* 9.0*  HCT 31.3* 28.2* 27.3* 27.7* 27.1*  PLT 205  172 188 202 239  MCV 92.1 90.1 90.1 89.9 89.1  MCH 29.7 29.7 28.7 28.6 29.6  MCHC 32.3 33.0 31.9 31.8 33.2  RDW 17.0* 16.5* 16.5* 16.1* 16.4*    Chemistries  Recent Labs  Lab 04/13/18 0438 04/15/18 0859 04/17/18 0443 04/18/18 0309 04/19/18 0419  NA 141 139 137 138 138  K 4.5 3.6 3.1* 3.1* 3.5  CL 112* 104 100 99 100  CO2 _0 GLUCOSE 109* 103* 78 106* 96  BUN 35* _1 CREATININE 0.77 0.83 1.08* 1.09* 1.07*  CALCIUM 8.5* 8.3* 7.5* 7.3* 7.4*  MG  --   --   --  0.8* 1.5*  AST  --  69*  --   --   --   ALT  --  43  --   --   --   ALKPHOS  --  63  --   --   --   BILITOT  --  1.0  --   --   --    ------------------------------------------------------------------------------------------------------------------ No results for input(s): CHOL, HDL, LDLCALC, TRIG, CHOLHDL, LDLDIRECT in the last 72 hours.  Lab Results  Component Value Date   HGBA1C 5.9 (H) 04/07/2018   ------------------------------------------------------------------------------------------------------------------ No results for input(s): TSH, T4TOTAL, T3FREE, THYROIDAB in the last 72 hours.  Invalid input(s): FREET3 ------------------------------------------------------------------------------------------------------------------ No results for input(s): VITAMINB12, FOLATE, FERRITIN, TIBC, IRON, RETICCTPCT in the last 72  hours.  Coagulation profile No results for input(s): INR, PROTIME in the last 168 hours.  No results for input(s): DDIMER in the last 72 hours.  Cardiac Enzymes No results for input(s): CKMB, TROPONINI, MYOGLOBIN in the last 168 hours.  Invalid input(s): CK ------------------------------------------------------------------------------------------------------------------    Component Value Date/Time   BNP 34.0 06/14/2014 1415    Jamorris Ndiaye MD [facility] pgr [phone] 11/03/2017, 9:25 AM  [facility] - Office  [phone]

## 2018-04-19 NOTE — Progress Notes (Signed)
   04/19/18 1300  Clinical Encounter Type  Visited With Health care provider;Patient not available  Referral From Nurse  Consult/Referral To Chaplain   Ck'd in with RN to see if urgent.  Pt was sleeping.  Will ask next chaplain to f/u during day on Sat.  Myra Gianotti resident, [phone]

## 2018-04-19 NOTE — Progress Notes (Signed)
   04/19/18 1400  Clinical Encounter Type  Visited With Patient and family together  Visit Type Initial  Referral From Nurse  Consult/Referral To Chaplain  Stress Factors  Patient Stress Factors None identified  Family Stress Factors None identified   Responded to spiritual care consult and second attempt from previous Chaplain. PT was alert and family was at bedside. PT stated she was not interested in [place] services. Also, she shared that she  Recently had visits from two other pastors . Next, she was thankful for the attempt. I offered spiritual care with [facility]. Chaplain available as needed.   Chaplain Fidel Levy [phone]

## 2018-04-19 NOTE — Progress Notes (Signed)
ANTICOAGULATION CONSULT NOTE - Follow Up Consult  Pharmacy Consult for Heparin Indication: DVT  Allergies  Allergen Reactions  . Fentanyl Anaphylaxis and Shortness Of Breath  . Lactose Intolerance (Gi) Other (See Comments)    G.I. Upset  . Butrans [Buprenorphine] Rash and Other (See Comments)    Infected skin underneath application  . Penicillins Rash    Facial rash Has patient had a PCN reaction causing immediate rash, facial/tongue/throat swelling, SOB or lightheadedness with hypotension: Yes Has patient had a PCN reaction causing severe rash involving mucus membranes or skin necrosis: No Has patient had a PCN reaction that required hospitalization No Has patient had a PCN reaction occurring within the last 10 years: Yes If all of the above answers are "NO", then may proceed with Cephalosporin use.   . Simvastatin Rash     Labs: Recent Labs    04/17/18 0443 04/17/18 0924 04/18/18 0309 04/19/18 0419  HGB 8.7*  --  8.8* 9.0*  HCT 27.3*  --  27.7* 27.1*  PLT 188  --  202 239  HEPARINUNFRC 0.40 0.33 0.66 0.51  CREATININE 1.08*  --  1.09* 1.07*    Estimated Creatinine Clearance: 62.2 mL/min (A) (by C-G formula based on SCr of 1.07 mg/dL (H)).  Assessment:  Anticoag: DVT from PICC. IV heparin. HL 0.51 remains in goal range this AM.  CBC stable  Goal of Therapy:  Heparin level 0.3-0.7 units/ml Monitor platelets by anticoagulation protocol: Yes   Plan:  Continue Heparin at 1500 units/hr Daily HL and CBC  Thank you Anette Guarneri, PharmD [phone]  04/19/2018,11:23 AM

## 2018-04-19 NOTE — Progress Notes (Signed)
Subjective/Chief Complaint: Lower abdominal pain somewhat less   Objective: Vital signs in last 24 hours: Temp:  [98.4 F (36.9 C)-98.5 F (36.9 C)] 98.5 F (36.9 C) (11/09 0355) Pulse Rate:  [83-88] 83 (11/09 0355) Resp:  [17-18] 17 (11/09 0355) BP: (101-136)/(58-66) 136/58 (11/09 0355) SpO2:  [94 %] 94 % (11/09 0355) Weight:  [026 kg] 123 kg (11/09 0500) Last BM Date: 04/17/18  Intake/Output from previous day: 11/08 0701 - 11/09 0700 In: 743.3 [I.V.:513.2; IV Piggyback:200.1] Out: 520 [Urine:500; Drains:20] Intake/Output this shift: Total I/O In: 10 [I.V.:10] Out: -   General appearance: alert and cooperative Resp: clear to auscultation bilaterally GI: drain lower abd, mild tenderness  Lab Results:  Recent Labs    04/18/18 0309 04/19/18 0419  WBC 8.5 7.2  HGB 8.8* 9.0*  HCT 27.7* 27.1*  PLT 202 239   BMET Recent Labs    04/18/18 0309 04/19/18 0419  NA 138 138  K 3.1* 3.5  CL 99 100  CO2 28 30  GLUCOSE 106* 96  BUN 15 20  CREATININE 1.09* 1.07*  CALCIUM 7.3* 7.4*   PT/INR No results for input(s): LABPROT, INR in the last 72 hours. ABG No results for input(s): PHART, HCO3 in the last 72 hours.  Invalid input(s): PCO2, PO2  Studies/Results: No results found.  Anti-infectives: Anti-infectives (From admission, onward)   Start     Dose/Rate Route Frequency Ordered Stop   04/15/18 1700  meropenem (MERREM) 1 g in sodium chloride 0.9 % 100 mL IVPB     1 g 200 mL/hr over 30 Minutes Intravenous Every 8 hours 04/15/18 1534     04/09/18 2200  meropenem (MERREM) 1 g in sodium chloride 0.9 % 100 mL IVPB  Status:  Discontinued     1 g 200 mL/hr over 30 Minutes Intravenous Every 8 hours 04/09/18 1244 04/14/18 0944   04/06/18 1300  meropenem (MERREM) 1 g in sodium chloride 0.9 % 100 mL IVPB  Status:  Discontinued     1 g 200 mL/hr over 30 Minutes Intravenous Every 12 hours 04/06/18 1145 04/09/18 1244   04/06/18 1145  meropenem (MERREM) 1 g in sodium  chloride 0.9 % 100 mL IVPB  Status:  Discontinued     1 g 200 mL/hr over 30 Minutes Intravenous Every 8 hours 04/06/18 1139 04/06/18 1145   04/06/18 0900  ciprofloxacin (CIPRO) IVPB 400 mg  Status:  Discontinued     400 mg 200 mL/hr over 60 Minutes Intravenous Every 12 hours 04/06/18 0732 04/06/18 1139   04/06/18 0300  metroNIDAZOLE (FLAGYL) IVPB 500 mg  Status:  Discontinued     500 mg 100 mL/hr over 60 Minutes Intravenous Every 8 hours 04/05/18 2335 04/06/18 1139   04/05/18 1930  ciprofloxacin (CIPRO) IVPB 400 mg     400 mg 200 mL/hr over 60 Minutes Intravenous  Once 04/05/18 1925 04/05/18 2217   04/05/18 1930  metroNIDAZOLE (FLAGYL) IVPB 500 mg     500 mg 100 mL/hr over 60 Minutes Intravenous  Once 04/05/18 1925 04/05/18 2102      Assessment/Plan: Acute perforated sigmoid diverticulitis with multiple abscesses - CT scan 11/5 revealsat least 3 fluid collectionsin the lower abdomen/pelvis with previous perforated diverticulitis; fluid collections are7.3 x 5.8 cm in left lower quadrant, air-fluid collection measuring 8.2 x 5.9 cm in the right lower quadrant, and8.9 x 5.3 cm fluid collection in the pelvis in the pre rectal space which appears to contain stool and fluid - IR perc drains x  2  - 04/16/18 - Hopefully this will resolve without surgical intervention. Recommend continue IV antibiotics. Will continue to follow.  ID -currently merrem 10/27>> VTE -SCDs, IV heparin FEN -IVF, continue clears today I spoke with her husband  LOS: 12 days    Zenovia Jarred 04/19/2018

## 2018-04-20 LAB — GLUCOSE, CAPILLARY
GLUCOSE-CAPILLARY: 110 mg/dL — AB (ref 70–99)
GLUCOSE-CAPILLARY: 112 mg/dL — AB (ref 70–99)
GLUCOSE-CAPILLARY: 89 mg/dL (ref 70–99)
GLUCOSE-CAPILLARY: 90 mg/dL (ref 70–99)
GLUCOSE-CAPILLARY: 99 mg/dL (ref 70–99)
Glucose-Capillary: 104 mg/dL — ABNORMAL HIGH (ref 70–99)
Glucose-Capillary: 114 mg/dL — ABNORMAL HIGH (ref 70–99)

## 2018-04-20 LAB — CBC
HEMATOCRIT: 26.2 % — AB (ref 36.0–46.0)
HEMOGLOBIN: 8.6 g/dL — AB (ref 12.0–15.0)
MCH: 29.4 pg (ref 26.0–34.0)
MCHC: 32.8 g/dL (ref 30.0–36.0)
MCV: 89.4 fL (ref 80.0–100.0)
NRBC: 0.3 % — AB (ref 0.0–0.2)
Platelets: 292 10*3/uL (ref 150–400)
RBC: 2.93 MIL/uL — ABNORMAL LOW (ref 3.87–5.11)
RDW: 16 % — ABNORMAL HIGH (ref 11.5–15.5)
WBC: 7.4 10*3/uL (ref 4.0–10.5)

## 2018-04-20 LAB — HEPARIN LEVEL (UNFRACTIONATED): Heparin Unfractionated: 0.61 IU/mL (ref 0.30–0.70)

## 2018-04-20 MED ORDER — TRAMADOL HCL 50 MG PO TABS
50.0000 mg | ORAL_TABLET | Freq: Two times a day (BID) | ORAL | Status: DC | PRN
  Administered 2018-04-21 – 2018-04-25 (×3): 50 mg via ORAL
  Filled 2018-04-20 (×3): qty 1

## 2018-04-20 MED ORDER — PREGABALIN 25 MG PO CAPS
50.0000 mg | ORAL_CAPSULE | Freq: Every day | ORAL | Status: DC
  Administered 2018-04-20 – 2018-04-25 (×6): 50 mg via ORAL
  Filled 2018-04-20 (×7): qty 2

## 2018-04-20 NOTE — Progress Notes (Signed)
Patient Demographics:    [name], is a 72 y.o. female, DOB - Mar 31, 1946, EHM:[id number]  Admit date - 04/05/2018   Admitting Physician [name], MD  Outpatient Primary MD for the patient is [name], MD  LOS - 63   Chief Complaint  Patient presents with  . Abdominal Pain      Brief narrative:  72yo female PMH HTN, DM-II, CKD, RA, H/o CAD s/p CABG (on plavix,), who was admitted to [facility] 10/26 with acute sigmoid diverticulitis without perforation or abscess. Initially felt to be improving on IV merrem, but she continued to have abdominal pain, CT abdomen pelvis was obtained 04/15/2018, was significant for worsening diverticulitis now with multiple abdominal fluid collections.  Patient developed acute right upper extremity DVT in the setting of PICC line, she was transferred from [facility] 04/15/2018 for evaluation by IR.  She has drain placed by IR 04/16/2018   Subjective:    [name] denies any fever or chills overnight, no further diarrhea, reports abdominal pain at baseline,   Assessment  & P lan :    Principal Problem:   Diverticulitis Active Problems:   DM (diabetes mellitus) ([place])   HTN (hypertension)   Rheumatoid arthritis ([place])   Anemia   Renal insufficiency   Sepsis due to undetermined organism ([place])   CKD (chronic kidney disease) stage 3, GFR 30-59 ml/min ([facility])   Fibromyalgia   Diverticulitis large intestine w/o perforation or abscess w/o bleeding   Acute diverticulitis   Perforated sigmoid colon ([place])   Acute renal failure superimposed on stage 3 chronic kidney disease ([place])   Sepsis due to acute perforated sigmoid diverticulitis- -sepsis criteria met on admission - imaging 04/06/2018 on admission significant for perforated sigmoid diverticulitis, was managed conservatively on IV meropenem, clear liquid diet, . -Repeat imaging 04/15/2018 with large  abscesses in the abdomen, transferred to [facility] for further care, general surgery consult greatly appreciated. -IR consult appreciated, status post drainage x2, for her diverticular abscess, with with drain, Gram stain showing gram-positive cocci, gram-positive rods as well, she is on meropenem, Gram stain showing multiple gram-positive species, her final culture showing multiple organisms, none predominant, with no anaerobes isolated, so we will continue with meropenem for now.  Acute right upper extremity DVT -In the setting of PICC line insertion, start on anticoagulation, continue with heparin for anticoagulation until no surgery or further procedure is indicated. -PICC line  discontinued 04/16/2018  acute kidney injury on CKD3. Baseline creat 1.2.   - AKI resolved w/ IVF but creat down to 0.7 now, appears to be volume overloaded, continue with diuresis as blood pressure tolerates.   Fibromyalgia -Discussed with the patient, will start on Lyrica which she was taking at home  Hypertension -Continue with metoprolol, given soft blood pressure continue to hold home medication including as  isosorbide/ olmesartan/amlodipine/HCTZ combo -Patient's blood pressure has improved, within normal limit, so no need to resume any antihypertensive meds right now, as I will continue with IV diuresis, as so far she is +20 L during this hospital stay.   DM2 -   BS's are good, cont Novolog/Humalog Sliding scale insulin  H/o CAD sp CABG /hx TIA - previously on Plavix and pravastatin, continue to  hold Plavix may need further procedures or surgery  Debility - needs to get OOB , PT seeing patient , SNF recommended, SW consulted   acute on chronic anemia--- sp 2 u prbc's,   Started on Lexapro for depression  hypoKalemia -repleted,  cont to return especially if she is on diuresis  Code Status : full  Disposition Plan  : TBD, will prob need SNF  Consults  :  Gen Surgery, IR  DVT Prophylaxis  :    SCDs, heparin GTT   Lab Results  Component Value Date   PLT 292 04/20/2018    Inpatient Medications  Scheduled Meds: . Chlorhexidine Gluconate Cloth  6 each Topical Daily  . escitalopram  10 mg Oral QHS  . feeding supplement (ENSURE ENLIVE)  237 mL Oral TID BM  . feeding supplement (GLUCERNA SHAKE)  237 mL Oral TID BM  . furosemide  40 mg Intravenous Daily  . latanoprost  1 drop Both Eyes QHS  . metoprolol tartrate  25 mg Oral BID  . nystatin   Topical TID  . pantoprazole  40 mg Oral Daily  . pravastatin  40 mg Oral QHS  . pregabalin  50 mg Oral QHS  . sodium chloride flush  10-40 mL Intracatheter Q12H  . sodium chloride flush  5 mL Intracatheter Q8H  . temazepam  30 mg Oral Once  . timolol  1 drop Both Eyes BID   Continuous Infusions: . heparin 1,500 Units/hr (04/19/18 2314)  . meropenem (MERREM) IV 1 g (04/20/18 1019)   PRN Meds:.acetaminophen **OR** acetaminophen, albuterol, hydrALAZINE, HYDROcodone-acetaminophen, ondansetron (ZOFRAN) IV, oxyCODONE, senna-docusate, sodium chloride flush, traMADol    Anti-infectives (From admission, onward)   Start     Dose/Rate Route Frequency Ordered Stop   04/15/18 1700  meropenem (MERREM) 1 g in sodium chloride 0.9 % 100 mL IVPB     1 g 200 mL/hr over 30 Minutes Intravenous Every 8 hours 04/15/18 1534     04/09/18 2200  meropenem (MERREM) 1 g in sodium chloride 0.9 % 100 mL IVPB  Status:  Discontinued     1 g 200 mL/hr over 30 Minutes Intravenous Every 8 hours 04/09/18 1244 04/14/18 0944   04/06/18 1300  meropenem (MERREM) 1 g in sodium chloride 0.9 % 100 mL IVPB  Status:  Discontinued     1 g 200 mL/hr over 30 Minutes Intravenous Every 12 hours 04/06/18 1145 04/09/18 1244   04/06/18 1145  meropenem (MERREM) 1 g in sodium chloride 0.9 % 100 mL IVPB  Status:  Discontinued     1 g 200 mL/hr over 30 Minutes Intravenous Every 8 hours 04/06/18 1139 04/06/18 1145   04/06/18 0900  ciprofloxacin (CIPRO) IVPB 400 mg  Status:   Discontinued     400 mg 200 mL/hr over 60 Minutes Intravenous Every 12 hours 04/06/18 0732 04/06/18 1139   04/06/18 0300  metroNIDAZOLE (FLAGYL) IVPB 500 mg  Status:  Discontinued     500 mg 100 mL/hr over 60 Minutes Intravenous Every 8 hours 04/05/18 2335 04/06/18 1139   04/05/18 1930  ciprofloxacin (CIPRO) IVPB 400 mg     400 mg 200 mL/hr over 60 Minutes Intravenous  Once 04/05/18 1925 04/05/18 2217   04/05/18 1930  metroNIDAZOLE (FLAGYL) IVPB 500 mg     500 mg 100 mL/hr over 60 Minutes Intravenous  Once 04/05/18 1925 04/05/18 2102        Objective:   Vitals:   04/19/18 0500 04/19/18 1504 04/19/18 2121 04/20/18 0451  BP:  122/62 (!) 115/57 (!) 128/52  Pulse:  82 79 82  Resp:  16    Temp:  98.1 F (36.7 C) 98.8 F (37.1 C) 98.4 F (36.9 C)  TempSrc:  Oral    SpO2:  95% 98% 98%  Weight: 123 kg     Height:        Wt Readings from Last 3 Encounters:  04/19/18 123 kg  02/13/18 116.1 kg  01/13/18 120.7 kg     Intake/Output Summary (Last 24 hours) at 04/20/2018 1356 Last data filed at 04/20/2018 1015 Gross per 24 hour  Intake 738.2 ml  Output 510 ml  Net 228.2 ml     Physical Exam  Awake Alert, Oriented X 3, No new F.N deficits, Normal affect Symmetrical Chest wall movement, Good air movement bilaterally, CTAB RRR,No Gallops,Rubs or new Murmurs, No Parasternal Heave +ve B.Sounds, Abd Soft, mild tenderness, has abdominal drain anteriorly, and another one posteriorly, No rebound - guarding or rigidity. No Cyanosis, Clubbing , lower extremity edema significantly improved, and her right upper extremity edema significantly improved today as well        Data Review:   Micro Results Recent Results (from the past 240 hour(s))  Aerobic/Anaerobic Culture (surgical/deep wound)     Status: Abnormal (Preliminary result)   Collection Time: 04/16/18  3:13 PM  Result Value Ref Range Status   Specimen Description PELVIS ABSCESS  Final   Special Requests NONE  Final    Gram Stain   Final    FEW WBC PRESENT,BOTH PMN AND MONONUCLEAR FEW GRAM POSITIVE COCCI IN CLUSTERS FEW GRAM POSITIVE RODS Performed at [facility], [place] [address], [place], [place] [address]    Culture (A)  Final    MULTIPLE ORGANISMS PRESENT, NONE PREDOMINANT NO ANAEROBES ISOLATED; CULTURE IN PROGRESS FOR 5 DAYS    Report Status PENDING  Incomplete  Aerobic/Anaerobic Culture (surgical/deep wound)     Status: None (Preliminary result)   Collection Time: 04/16/18  3:13 PM  Result Value Ref Range Status   Specimen Description ABSCESS PELVIS  Final   Special Requests SAMPLE NO 2  Final   Gram Stain   Final    FEW WBC PRESENT,BOTH PMN AND MONONUCLEAR NO ORGANISMS SEEN    Culture   Final    NO GROWTH 4 DAYS NO ANAEROBES ISOLATED; CULTURE IN PROGRESS FOR 5 DAYS Performed at [facility], [place] [address], [place], [place] [address]    Report Status PENDING  Incomplete    Radiology Reports Ct Abdomen Pelvis Wo Contrast  Result Date: 04/15/2018 CLINICAL DATA:  Acute lower abdominal pain. EXAM: CT ABDOMEN AND PELVIS WITHOUT CONTRAST TECHNIQUE: Multidetector CT imaging of the abdomen and pelvis was performed following the standard protocol without IV contrast. COMPARISON:  CT scan of April 06, 2018. FINDINGS: Lower chest: No acute abnormality. Hepatobiliary: No focal liver abnormality is seen. Status post cholecystectomy. No biliary dilatation. Pancreas: Unremarkable. No pancreatic ductal dilatation or surrounding inflammatory changes. Spleen: Normal in size without focal abnormality. Adrenals/Urinary Tract: Adrenal glands appear normal. Small nonobstructive right renal calculus is noted. No hydronephrosis or renal obstruction is noted. Urinary bladder is unremarkable. Stomach/Bowel: The stomach and appendix are unremarkable. Sigmoid diverticulosis is noted. Some degree of residual diverticulitis cannot be excluded. However, 7.3 x 5.8 cm fluid collection is noted in left lower  quadrant, which communicates with smaller fluid collection measuring 5.1 x 3.6 cm. This is concerning for abscess status post previous perforated diverticulitis. Air-fluid collection  measuring 8.2 x 5.9 cm is seen in the right lower quadrant also concerning for possible abscess. 8.9 x 5.3 cm fluid and stool collection is seen posterior to the uterus and anterior to the rectum concerning for abscess or contained perforation. Vascular/Lymphatic: Aortic atherosclerosis. No enlarged abdominal or pelvic lymph nodes. Reproductive: Uterus and bilateral adnexa are unremarkable. Other: Stable 6.9 x 4.5 cm fluid collection in left inguinal region. This most likely is benign. Musculoskeletal: No acute or significant osseous findings. IMPRESSION: There are at least 3 fluid collections and probable abscesses seen in the lower abdomen and pelvis, following previously perforated diverticulitis. 7.3 x 5.8 cm fluid collection is noted in left lower quadrant, as well as air-fluid collection measuring 8.2 x 5.9 cm seen in the right lower quadrant. Also noted is 8.9 x 5.3 cm fluid collection in the pelvis in the pre rectal space which appears to contain stool and fluid. These results will be called to the ordering clinician or representative by the Radiologist Assistant, and communication documented in the PACS or zVision Dashboard. Small nonobstructive right renal calculus. Aortic Atherosclerosis (ICD10-I70.0). Electronically Signed   By: Marijo Conception, M.D.   On: 04/15/2018 14:10   Ct Abdomen Pelvis Wo Contrast  Result Date: 04/06/2018 CLINICAL DATA:  72 year old female with concern for perforated diverticulitis. EXAM: CT ABDOMEN AND PELVIS WITHOUT CONTRAST TECHNIQUE: Multidetector CT imaging of the abdomen and pelvis was performed following the standard protocol without IV contrast. COMPARISON:  Chest radiograph dated 04/06/2018 and CT dated 04/05/2018 FINDINGS: Evaluation of this exam is limited in the absence of intravenous  contrast. Lower chest: Diffuse interstitial coarsening and streaky atelectasis/scarring of the lung bases. There is mild cardiomegaly. There is hypoattenuation of the cardiac blood pool suggestive of a degree of anemia. Clinical correlation is recommended. There is pneumoperitoneum and small amount of free fluid within the pelvis. Hepatobiliary: Advanced fatty infiltration of the liver. No intrahepatic biliary ductal dilatation. Cholecystectomy. Pancreas: Unremarkable. No pancreatic ductal dilatation or surrounding inflammatory changes. Spleen: Normal in size without focal abnormality. Adrenals/Urinary Tract: The adrenal glands are unremarkable. There is no hydronephrosis on either side. Excreted contrast in the renal collecting systems bilaterally. The visualized ureters appear unremarkable. There is trabeculated appearance of the bladder wall likely related to chronic bladder dysfunction. Correlation with urinalysis recommended to exclude cystitis. Stomach/Bowel: There is extensive sigmoid diverticulosis. There is associated inflammatory changes and perforation of the sigmoid colon. There is increased size of the extraluminal air since the prior CT. No drainable fluid collection is noted at this time. There is no bowel obstruction. Normal appendix. Vascular/Lymphatic: There is advanced aortoiliac atherosclerotic disease. No portal venous gas. There is no adenopathy. Reproductive: The uterus is grossly unremarkable. Other: None Musculoskeletal: Degenerative changes of the spine. No acute osseous pathology. IMPRESSION: 1. Perforated sigmoid diverticulitis with increased size of the extraluminal air since the prior CT. No drainable fluid collection noted at this time. 2. No bowel obstruction. Normal appendix. 3. Advanced fatty infiltration of the liver. These results were called by telephone at the time of interpretation on 04/06/2018 at 11:09 pm to nurse Hearn, who verbally acknowledged these results. Electronically  Signed   By: Anner Crete M.D.   On: 04/06/2018 23:25   Dg Chest 2 View  Result Date: 04/06/2018 CLINICAL DATA:  Dyspnea, sepsis, pt listless and unable to cooperate EXAM: CHEST - 2 VIEW COMPARISON:  06/27/2016 and 06/18/2016. FINDINGS: On the lateral view, there is evidence of a small amount free intraperitoneal air under the  left hemidiaphragm. Lung volumes are low. There is opacity at the bases that is likely due to atelectasis along with bronchovascular crowding. No convincing pneumonia and no evidence of pulmonary edema. 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 Cardiac silhouette is normal in size. No mediastinal hilar masses. No pleural effusion.  No pneumothorax. Skeletal structures are grossly intact. IMPRESSION: 1. Small amount of free intraperitoneal air. Recommend follow-up abdomen and pelvis CT with contrast for further assessment. 2. Somewhat limited study due to low lung volumes. Allowing for this, no acute cardiopulmonary disease. Electronically Signed   By: Lajean Manes M.D.   On: 04/06/2018 11:23   Ct Abdomen Pelvis W Contrast  Result Date: 04/05/2018 CLINICAL DATA:  Patient has bilateral lower abdominal sharp intermittent pain with nausea without vomiting. H/x diverticulitis patient states pain and symptoms similar to past. EXAM: CT ABDOMEN AND PELVIS WITH CONTRAST TECHNIQUE: Multidetector CT imaging of the abdomen and pelvis was performed using the standard protocol following bolus administration of intravenous contrast. CONTRAST:  33m ISOVUE-300 IOPAMIDOL (ISOVUE-300) INJECTION 61% COMPARISON:  02/06/2018 FINDINGS: Lower chest: There is scarring in both lung bases. Coronary artery calcifications are present. The heart is normal in size. Hepatobiliary: Liver is diffusely low attenuation. No focal liver lesions. Cholecystectomy. Pancreas: Unremarkable. No pancreatic ductal dilatation or surrounding inflammatory changes. Spleen: Normal in size without focal abnormality.  Adrenals/Urinary Tract: Adrenal glands are normal. There is symmetric enhancement and excretion from the kidneys. 1 millimeter intrarenal calcification identified in the LOWER pole of the RIGHT kidney, not associated with obstruction. Ureters are unremarkable. The bladder and visualized portion of the urethra are normal. Stomach/Bowel: The stomach is normal in appearance. Small bowel loops are normal in caliber and wall thickness. The appendix is well seen and has a normal appearance. There is significant inflammatory change in the region of sigmoid colon and associated numerous diverticula in this segment. Fluid extends in the sigmoid mesentery. The diseased segment of sigmoid is adjacent to the uterus and the urinary bladder. No evidence for abscess or perforation. Vascular/Lymphatic: There is atherosclerotic calcification of the abdominal aorta. There is normal vascular opacification of the celiac axis, superior mesenteric artery, and inferior mesenteric artery. Normal appearance of the portal venous system and inferior vena cava. Reproductive: The uterus is present and is surrounded by inflammatory changes from the sigmoid diverticulitis. No adnexal mass. Other: Trace amount of free pelvic fluid. Musculoskeletal: Incompletely imaged fluid attenuation collection within the LEFT hip abductors, measuring at least 7.3 x 4.8 centimeters. The appearance is stable since 2017. There are degenerative changes in the lumbar spine with 7 millimeters anterolisthesis of L4 on L5. Median sternotomy. IMPRESSION: 1. Acute sigmoid diverticulitis with significant fluid in the sigmoid mesentery. There is no evidence for perforation or abscess. 2. Coronary artery calcifications. 3. Hepatic steatosis.  Cholecystectomy. 4. Nonobstructing intrarenal calculus in the LOWER pole the RIGHT kidney. 5. Normal appendix. 6.  Aortic atherosclerosis. 7. Probably benign ganglion cyst within the LEFT hip abductors. Electronically Signed   By:  ENolon NationsM.D.   On: 04/05/2018 22:06   UKoreaVenous Img Upper Uni Right  Result Date: 04/15/2018 CLINICAL DATA:  None EXAM: RIGHT UPPER EXTREMITY VENOUS DOPPLER ULTRASOUND TECHNIQUE: Gray-scale sonography with graded compression, as well as color Doppler and duplex ultrasound were performed to evaluate the upper extremity deep venous system from the level of the subclavian vein and including the jugular, axillary, basilic, radial, ulnar and upper cephalic vein. Spectral Doppler was utilized to evaluate flow at rest and with  distal augmentation maneuvers. COMPARISON:  None. FINDINGS: Contralateral Subclavian Vein: Respiratory phasicity is normal and symmetric with the symptomatic side. No evidence of thrombus. Normal compressibility. Internal Jugular Vein: No evidence of thrombus. Normal compressibility, respiratory phasicity and response to augmentation. Subclavian Vein: No evidence of thrombus. Normal compressibility, respiratory phasicity and response to augmentation. Axillary Vein: No evidence of thrombus. Normal compressibility, respiratory phasicity and response to augmentation. Cephalic Vein: No evidence of thrombus. Normal compressibility, respiratory phasicity and response to augmentation. Basilic Vein: No evidence of thrombus. Normal compressibility, respiratory phasicity and response to augmentation. Brachial Veins: Occlusive thrombus of the right brachial vein, paired brachial veins. Radial Veins: No evidence of thrombus. Normal compressibility, respiratory phasicity and response to augmentation. Ulnar Veins: No evidence of thrombus. Normal compressibility, respiratory phasicity and response to augmentation. Other Findings:  Edema IMPRESSION: Sonographic survey of the right upper extremity is positive for occlusive DVT of the paired right brachial vein. Electronically Signed   By: Corrie Mckusick D.O.   On: 04/15/2018 14:17   Dg Chest Port 1 View  Result Date: 04/11/2018 CLINICAL DATA:  PICC  placement. EXAM: PORTABLE CHEST 1 VIEW COMPARISON:  Chest radiograph 04/06/2018. FINDINGS: Cardiomegaly.  Mild vascular congestion.  Prior CABG. RIGHT arm PICC line tip appears to lie distal SVC. This is marked with an arrow. No pneumothorax. IMPRESSION: RIGHT arm PICC line tip appears to lie distal SVC. No pneumothorax. Cardiomegaly with mild vascular congestion, stable from priors. Electronically Signed   By: Staci Righter M.D.   On: 04/11/2018 09:34   Ct Image Guided Fluid Drain By Catheter  Result Date: 04/16/2018 INDICATION: Pelvic abscess x2 EXAM: PELVIC ABSCESS DRAIN X2 CT-GUIDED MEDICATIONS: The patient is currently admitted to the hospital and receiving intravenous antibiotics. The antibiotics were administered within an appropriate time frame prior to the initiation of the procedure. ANESTHESIA/SEDATION: Fentanyl 0 mcg IV; Versed 4 mg IV, 4 mg morphine sulfate Moderate Sedation Time:  53 minutes The patient was continuously monitored during the procedure by the interventional radiology nurse under my direct supervision. COMPLICATIONS: None immediate. PROCEDURE: Informed written consent was obtained from the patient after a thorough discussion of the procedural risks, benefits and alternatives. All questions were addressed. Maximal Sterile Barrier Technique was utilized including caps, mask, sterile gowns, sterile gloves, sterile drape, hand hygiene and skin antiseptic. A timeout was performed prior to the initiation of the procedure. In the prone position, the right gluteal region was prepped and draped in a sterile fashion. Under CT guidance, an 18 gauge needle was advanced into the pelvic abscess and removed over an Amplatz wire. Ten [place] dilator followed by a 10 [place] drain were inserted in the fluid collection. Pus was aspirated. The patient was placed supine. The lower abdomen was prepped and draped in a sterile fashion. 1% lidocaine was utilized for local anesthesia. Under CT guidance, an 18  gauge needle was inserted into the upper pelvic fluid collection and removed over an Amplatz wire. Ten [place] dilator followed by 10 [place] drain were inserted. Cloudy yellow fluid was aspirated. FINDINGS: Images demonstrate 33 French drain placement into the 2 fluid collections described above. IMPRESSION: Successful pelvic abscess drain x2 Electronically Signed   By: Marybelle Killings M.D.   On: 04/16/2018 16:01     CBC Recent Labs  Lab 04/16/18 0423 04/17/18 0443 04/18/18 0309 04/19/18 0419 04/20/18 0504  WBC 8.4 7.4 8.5 7.2 7.4  HGB 9.3* 8.7* 8.8* 9.0* 8.6*  HCT 28.2* 27.3* 27.7* 27.1* 26.2*  PLT 172 188  202 239 292  MCV 90.1 90.1 89.9 89.1 89.4  MCH 29.7 28.7 28.6 29.6 29.4  MCHC 33.0 31.9 31.8 33.2 32.8  RDW 16.5* 16.5* 16.1* 16.4* 16.0*    Chemistries  Recent Labs  Lab 04/15/18 0859 04/17/18 0443 04/18/18 0309 04/19/18 0419  NA 139 137 138 138  K 3.6 3.1* 3.1* 3.5  CL 104 100 99 100  CO2 26 28 28 30   GLUCOSE 103* 78 106* 96  BUN 22 21 15 20   CREATININE 0.83 1.08* 1.09* 1.07*  CALCIUM 8.3* 7.5* 7.3* 7.4*  MG  --   --  0.8* 1.5*  AST 69*  --   --   --   ALT 43  --   --   --   ALKPHOS 63  --   --   --   BILITOT 1.0  --   --   --    ------------------------------------------------------------------------------------------------------------------ No results for input(s): CHOL, HDL, LDLCALC, TRIG, CHOLHDL, LDLDIRECT in the last 72 hours.  Lab Results  Component Value Date   HGBA1C 5.9 (H) 04/07/2018   ------------------------------------------------------------------------------------------------------------------ No results for input(s): TSH, T4TOTAL, T3FREE, THYROIDAB in the last 72 hours.  Invalid input(s): FREET3 ------------------------------------------------------------------------------------------------------------------ No results for input(s): VITAMINB12, FOLATE, FERRITIN, TIBC, IRON, RETICCTPCT in the last 72 hours.  Coagulation profile No results for  input(s): INR, PROTIME in the last 168 hours.  No results for input(s): DDIMER in the last 72 hours.  Cardiac Enzymes No results for input(s): CKMB, TROPONINI, MYOGLOBIN in the last 168 hours.  Invalid input(s): CK ------------------------------------------------------------------------------------------------------------------    Component Value Date/Time   BNP 34.0 06/14/2014 1415    Anneth Brunell MD [facility] pgr [phone] 11/03/2017, 9:25 AM  [facility] - Office  [phone]

## 2018-04-20 NOTE — Progress Notes (Signed)
Referring Physician(s): Dr. Kae Heller  Supervising Physician: Jacqulynn Cadet  Patient Status:  [facility] - In-pt  Chief Complaint: Follow-up abdominal drains x 2 placed 11/6 by Dr. Barbie Banner  Subjective:  Still complains of abdominal pain, having diarrhea. Concerned she may need surgery.  Allergies: Fentanyl; Lactose intolerance (gi); Butrans [buprenorphine]; Penicillins; and Simvastatin  Medications: Prior to Admission medications   Medication Sig Start Date End Date Taking? Authorizing Provider  Blood Glucose Monitoring Suppl (FREESTYLE LITE) DEVI See admin instructions. 12/22/14  Yes [provider]  ciprofloxacin (CIPRO) 500 MG/5ML (10%) suspension Take by mouth 2 (two) times daily.   Yes [provider]  clopidogrel (PLAVIX) 75 MG tablet Take 1 tablet (75 mg total) by mouth daily with breakfast. 01/14/18  Yes Rehman, Mechele Dawley, MD  Dexlansoprazole 30 MG capsule Take 40 mg by mouth daily.   Yes [provider]  diclofenac sodium (VOLTAREN) 1 % GEL Apply 2 g topically 4 (four) times daily as needed (Pain).    Yes [provider]  glipiZIDE (GLUCOTROL) 10 MG tablet Take 10 mg by mouth daily before breakfast.   Yes [provider]  HYDROcodone-acetaminophen (NORCO) 10-325 MG tablet Take 1 tablet by mouth 4 (four) times daily.   Yes [provider]  IRON PO Take 1 tablet by mouth daily.   Yes [provider]  isosorbide dinitrate (ISORDIL) 5 MG tablet Take 1 tablet (5 mg total) by mouth 3 (three) times daily before meals. 02/07/18  Yes Rehman, Mechele Dawley, MD  leflunomide (ARAVA) 20 MG tablet Take 20 mg by mouth daily.   Yes [provider]  metoprolol succinate (TOPROL-XL) 25 MG 24 hr tablet Take 25 mg by mouth daily.   Yes [provider]  metroNIDAZOLE (FLAGYL) 250 MG tablet Take 250 mg by mouth 3 (three) times daily.   Yes [provider]  Olmesartan-amLODIPine-HCTZ (TRIBENZOR) 40-5-25 MG TABS Take 1 tablet  by mouth daily.    Yes [provider]  pravastatin (PRAVACHOL) 40 MG tablet Take 40 mg by mouth at bedtime.   Yes [provider]  predniSONE (DELTASONE) 5 MG tablet Take 5 mg by mouth daily with breakfast.   Yes [provider]  pregabalin (LYRICA) 75 MG capsule Take 75 mg by mouth 3 (three) times daily.   Yes [provider]  Tafluprost (ZIOPTAN) 0.0015 % SOLN Place 1 drop into both eyes at bedtime.    Yes [provider]  temazepam (RESTORIL) 30 MG capsule Take 30 mg by mouth at bedtime.   Yes [provider]  timolol (BETIMOL) 0.5 % ophthalmic solution Place 1 drop into both eyes 2 (two) times daily.    Yes [provider]  Tofacitinib Citrate (XELJANZ) 5 MG TABS Take 1 tablet by mouth daily.   Yes [provider]  VITAMIN D, ERGOCALCIFEROL, PO Take 50,000 Units by mouth once a week. Thursdays   Yes [provider]  docusate sodium (COLACE) 100 MG capsule Take 2 capsules (200 mg total) by mouth at bedtime. Patient taking differently: Take 200 mg by mouth as needed.  02/13/18   Rogene Houston, MD     Vital Signs: BP (!) 128/52 (BP Location: Left Leg)   Pulse 82   Temp 98.4 F (36.9 C)   Resp 16   Ht 5' 5.5" (1.664 m)   Wt 271 lb 2.7 oz (123 kg)   SpO2 98%   BMI 44.44 kg/m   Physical Exam  Constitutional: No distress.  Cardiovascular: Normal rate, regular rhythm and normal heart sounds.  Pulmonary/Chest: Effort normal and breath sounds normal.  Abdominal: Soft. There is tenderness.  Abdominal drains x 2 with purulent output  Neurological: She is alert.  Skin: Skin is warm and dry. She is not diaphoretic.  Nursing note and vitals reviewed.   Imaging: Ct Image Guided Fluid Drain By Catheter  Result Date: 04/16/2018 INDICATION: Pelvic abscess x2 EXAM: PELVIC ABSCESS DRAIN X2 CT-GUIDED MEDICATIONS: The patient is currently admitted to the hospital and receiving intravenous antibiotics. The  antibiotics were administered within an appropriate time frame prior to the initiation of the procedure. ANESTHESIA/SEDATION: Fentanyl 0 mcg IV; Versed 4 mg IV, 4 mg morphine sulfate Moderate Sedation Time:  53 minutes The patient was continuously monitored during the procedure by the interventional radiology nurse under my direct supervision. COMPLICATIONS: None immediate. PROCEDURE: Informed written consent was obtained from the patient after a thorough discussion of the procedural risks, benefits and alternatives. All questions were addressed. Maximal Sterile Barrier Technique was utilized including caps, mask, sterile gowns, sterile gloves, sterile drape, hand hygiene and skin antiseptic. A timeout was performed prior to the initiation of the procedure. In the prone position, the right gluteal region was prepped and draped in a sterile fashion. Under CT guidance, an 18 gauge needle was advanced into the pelvic abscess and removed over an Amplatz wire. Ten [place] dilator followed by a 10 [place] drain were inserted in the fluid collection. Pus was aspirated. The patient was placed supine. The lower abdomen was prepped and draped in a sterile fashion. 1% lidocaine was utilized for local anesthesia. Under CT guidance, an 18 gauge needle was inserted into the upper pelvic fluid collection and removed over an Amplatz wire. Ten [place] dilator followed by 10 [place] drain were inserted. Cloudy yellow fluid was aspirated. FINDINGS: Images demonstrate 27 French drain placement into the 2 fluid collections described above. IMPRESSION: Successful pelvic abscess drain x2 Electronically Signed   By: Marybelle Killings M.D.   On: 04/16/2018 16:01    Labs:  CBC: Recent Labs    04/17/18 0443 04/18/18 0309 04/19/18 0419 04/20/18 0504  WBC 7.4 8.5 7.2 7.4  HGB 8.7* 8.8* 9.0* 8.6*  HCT 27.3* 27.7* 27.1* 26.2*  PLT 188 202 239 292    COAGS: Recent Labs    04/06/18 1952  INR 1.22  APTT 33    BMP: Recent Labs     04/15/18 0859 04/17/18 0443 04/18/18 0309 04/19/18 0419  NA 139 137 138 138  K 3.6 3.1* 3.1* 3.5  CL 104 100 99 100  CO2 26 28 28 30   GLUCOSE 103* 78 106* 96  BUN 22 21 15 20   CALCIUM 8.3* 7.5* 7.3* 7.4*  CREATININE 0.83 1.08* 1.09* 1.07*  GFRNONAA >60 50* 49* 50*  GFRAA >60 58* 57* 58*    LIVER FUNCTION TESTS: Recent Labs    04/08/18 0415 04/09/18 0355 04/10/18 0351 04/15/18 0859  BILITOT 0.6 0.5 0.7 1.0  AST 46* 54* 41 69*  ALT 33 36 30 43  ALKPHOS 48 55 51 63  PROT 5.6* 5.2* 5.5* 5.6*  ALBUMIN 2.3* 2.2* 2.2* 2.1*    Assessment and Plan:  S/p abdominal abscess drains x 2 placed 11/6 in IR by Dr. Barbie Banner. Patient continues to have abdominal pain which surgery is following and considering possible further surgical intervention per chart. Right TG drain with 15 cc purulent output last 24 hours, left abdominal drain with 45 cc purulent output in last 24  hours. Cultures so far show gram positive cocci in clusters and gram positive rods - she is currently on meropenem per primary team.  Afebrile, WBC 7.4, hgb 8.6.  Continue current management - IR will continue to follow.   Electronically Signed: Joaquim Nam, PA-C 04/20/2018, 12:55 PM   I spent a total of 15 Minutes at the the patient's bedside AND on the patient's hospital floor or unit, greater than 50% of which was counseling/coordinating care for abdominal/pelvic abscess drains x2.

## 2018-04-20 NOTE — Progress Notes (Signed)
ANTICOAGULATION CONSULT NOTE - Follow Up Consult  Pharmacy Consult for Heparin Indication: DVT  Allergies  Allergen Reactions  . Fentanyl Anaphylaxis and Shortness Of Breath  . Lactose Intolerance (Gi) Other (See Comments)    G.I. Upset  . Butrans [Buprenorphine] Rash and Other (See Comments)    Infected skin underneath application  . Penicillins Rash    Facial rash Has patient had a PCN reaction causing immediate rash, facial/tongue/throat swelling, SOB or lightheadedness with hypotension: Yes Has patient had a PCN reaction causing severe rash involving mucus membranes or skin necrosis: No Has patient had a PCN reaction that required hospitalization No Has patient had a PCN reaction occurring within the last 10 years: Yes If all of the above answers are "NO", then may proceed with Cephalosporin use.   . Simvastatin Rash     Labs: Recent Labs    04/18/18 0309 04/19/18 0419 04/20/18 0504  HGB 8.8* 9.0* 8.6*  HCT 27.7* 27.1* 26.2*  PLT 202 239 292  HEPARINUNFRC 0.66 0.51 0.61  CREATININE 1.09* 1.07*  --     Estimated Creatinine Clearance: 62.2 mL/min (A) (by C-G formula based on SCr of 1.07 mg/dL (H)).  Assessment: 72 yo F with DVT from PICC. IV heparin. HL 0.61, CBC low but stable  Goal of Therapy:  Heparin level 0.3-0.7 units/ml Monitor platelets by anticoagulation protocol: Yes   Plan:  Continue Heparin at 1500 units/hr Daily HL and CBC  Elenor Quinones, PharmD, BCPS Clinical Pharmacist Phone number [phone] 04/20/2018 9:11 AM

## 2018-04-20 NOTE — Progress Notes (Signed)
[facility]/Trauma Progress Note      Assessment/Plan Acute perforated sigmoid diverticulitis with multiple abscesses - CT scan 11/5 revealsat least 3 fluid collectionsin the lower abdomen/pelvis with previous perforated diverticulitis; fluid collections are7.3 x 5.8 cm in left lower quadrant, air-fluid collection measuring 8.2 x 5.9 cm in the right lower quadrant, and8.9 x 5.3 cm fluid collection in the pelvis in the pre rectal space which appears to contain stool and fluid -IR perc drains x 2  - 04/16/18 - Hopefully this will resolve without surgical intervention. Recommend continue IV antibiotics. If pain does not improve she may need surgery. Will continue to follow and determine need for surgery tomorrow.   ID -currently merrem 10/27>> VTE -SCDs, IV heparin FEN -IVF,continue clears today   LOS: 13 days    Subjective: CC: abdominal pain  Pain about the same as yesterday. Having diarrhea and pain is worse with diarrhea. No fever or chills. No family at bedside. Discussed possible need for surgery with the patient.   Objective: Vital signs in last 24 hours: Temp:  [98.1 F (36.7 C)-98.8 F (37.1 C)] 98.4 F (36.9 C) (11/10 0451) Pulse Rate:  [79-82] 82 (11/10 0451) Resp:  [16] 16 (11/09 1504) BP: (115-128)/(52-62) 128/52 (11/10 0451) SpO2:  [95 %-98 %] 98 % (11/10 0451) Last BM Date: 04/17/18  Intake/Output from previous day: 11/09 0701 - 11/10 0700 In: 738.2 [I.V.:363.1; IV Piggyback:355.1] Out: 510 [Urine:450; Drains:60] Intake/Output this shift: Total I/O In: 10 [I.V.:10] Out: -   PE: Gen:  Alert, NAD Pulm: rate and effort normal Abd: Soft, obese, ND, +BS, drains x 2 both with yellow purulent drainage, moderate TTP with guarding. No peritonitis  Skin: no rashes noted, warm and dry   Anti-infectives: Anti-infectives (From admission, onward)   Start     Dose/Rate Route Frequency Ordered Stop   04/15/18 1700  meropenem (MERREM) 1 g in  sodium chloride 0.9 % 100 mL IVPB     1 g 200 mL/hr over 30 Minutes Intravenous Every 8 hours 04/15/18 1534     04/09/18 2200  meropenem (MERREM) 1 g in sodium chloride 0.9 % 100 mL IVPB  Status:  Discontinued     1 g 200 mL/hr over 30 Minutes Intravenous Every 8 hours 04/09/18 1244 04/14/18 0944   04/06/18 1300  meropenem (MERREM) 1 g in sodium chloride 0.9 % 100 mL IVPB  Status:  Discontinued     1 g 200 mL/hr over 30 Minutes Intravenous Every 12 hours 04/06/18 1145 04/09/18 1244   04/06/18 1145  meropenem (MERREM) 1 g in sodium chloride 0.9 % 100 mL IVPB  Status:  Discontinued     1 g 200 mL/hr over 30 Minutes Intravenous Every 8 hours 04/06/18 1139 04/06/18 1145   04/06/18 0900  ciprofloxacin (CIPRO) IVPB 400 mg  Status:  Discontinued     400 mg 200 mL/hr over 60 Minutes Intravenous Every 12 hours 04/06/18 0732 04/06/18 1139   04/06/18 0300  metroNIDAZOLE (FLAGYL) IVPB 500 mg  Status:  Discontinued     500 mg 100 mL/hr over 60 Minutes Intravenous Every 8 hours 04/05/18 2335 04/06/18 1139   04/05/18 1930  ciprofloxacin (CIPRO) IVPB 400 mg     400 mg 200 mL/hr over 60 Minutes Intravenous  Once 04/05/18 1925 04/05/18 2217   04/05/18 1930  metroNIDAZOLE (FLAGYL) IVPB 500 mg     500 mg 100 mL/hr over 60 Minutes Intravenous  Once 04/05/18 1925 04/05/18 2102      Lab  Results:  Recent Labs    04/19/18 0419 04/20/18 0504  WBC 7.2 7.4  HGB 9.0* 8.6*  HCT 27.1* 26.2*  PLT 239 292   BMET Recent Labs    04/18/18 0309 04/19/18 0419  NA 138 138  K 3.1* 3.5  CL 99 100  CO2 28 30  GLUCOSE 106* 96  BUN 15 20  CREATININE 1.09* 1.07*  CALCIUM 7.3* 7.4*   PT/INR No results for input(s): LABPROT, INR in the last 72 hours. CMP     Component Value Date/Time   NA 138 04/19/2018 0419   K 3.5 04/19/2018 0419   CL 100 04/19/2018 0419   CO2 30 04/19/2018 0419   GLUCOSE 96 04/19/2018 0419   BUN 20 04/19/2018 0419   CREATININE 1.07 (H) 04/19/2018 0419   CALCIUM 7.4 (L) 04/19/2018  0419   PROT 5.6 (L) 04/15/2018 0859   ALBUMIN 2.1 (L) 04/15/2018 0859   AST 69 (H) 04/15/2018 0859   ALT 43 04/15/2018 0859   ALKPHOS 63 04/15/2018 0859   BILITOT 1.0 04/15/2018 0859   GFRNONAA 50 (L) 04/19/2018 0419   GFRAA 58 (L) 04/19/2018 0419   Lipase  No results found for: LIPASE  Studies/Results: No results found.    Kalman Drape , [facility] 04/20/2018, 11:14 AM  Pager: [phone] Mon-Wed, Friday 7:00am-4:30pm Thurs 7am-11:30am  Consults: [phone]

## 2018-04-21 LAB — CBC
HEMATOCRIT: 26.1 % — AB (ref 36.0–46.0)
HEMOGLOBIN: 8.7 g/dL — AB (ref 12.0–15.0)
MCH: 29.5 pg (ref 26.0–34.0)
MCHC: 33.3 g/dL (ref 30.0–36.0)
MCV: 88.5 fL (ref 80.0–100.0)
Platelets: 334 10*3/uL (ref 150–400)
RBC: 2.95 MIL/uL — AB (ref 3.87–5.11)
RDW: 16.2 % — ABNORMAL HIGH (ref 11.5–15.5)
WBC: 7.8 10*3/uL (ref 4.0–10.5)
nRBC: 0.5 % — ABNORMAL HIGH (ref 0.0–0.2)

## 2018-04-21 LAB — BASIC METABOLIC PANEL
Anion gap: 10 (ref 5–15)
BUN: 20 mg/dL (ref 8–23)
CHLORIDE: 97 mmol/L — AB (ref 98–111)
CO2: 31 mmol/L (ref 22–32)
Calcium: 7.6 mg/dL — ABNORMAL LOW (ref 8.9–10.3)
Creatinine, Ser: 1.31 mg/dL — ABNORMAL HIGH (ref 0.44–1.00)
GFR calc Af Amer: 46 mL/min — ABNORMAL LOW (ref >60)
GFR calc non Af Amer: 39 mL/min — ABNORMAL LOW (ref >60)
GLUCOSE: 91 mg/dL (ref 70–99)
Potassium: 3.3 mmol/L — ABNORMAL LOW (ref 3.5–5.1)
Sodium: 138 mmol/L (ref 135–145)

## 2018-04-21 LAB — GLUCOSE, CAPILLARY
GLUCOSE-CAPILLARY: 121 mg/dL — AB (ref 70–99)
GLUCOSE-CAPILLARY: 106 mg/dL — AB (ref 70–99)
Glucose-Capillary: 101 mg/dL — ABNORMAL HIGH (ref 70–99)
Glucose-Capillary: 97 mg/dL (ref 70–99)
Glucose-Capillary: 104 mg/dL — ABNORMAL HIGH (ref 70–99)

## 2018-04-21 LAB — HEPARIN LEVEL (UNFRACTIONATED): Heparin Unfractionated: 0.57 IU/mL (ref 0.30–0.70)

## 2018-04-21 MED ORDER — SODIUM CHLORIDE 0.9 % IV BOLUS
1000.0000 mL | Freq: Once | INTRAVENOUS | Status: AC
  Administered 2018-04-21: 1000 mL via INTRAVENOUS

## 2018-04-21 MED ORDER — ESCITALOPRAM OXALATE 20 MG PO TABS
20.0000 mg | ORAL_TABLET | Freq: Every day | ORAL | Status: DC
  Administered 2018-04-21 – 2018-04-29 (×9): 20 mg via ORAL
  Filled 2018-04-21 (×9): qty 1

## 2018-04-21 MED ORDER — POTASSIUM CHLORIDE CRYS ER 20 MEQ PO TBCR
40.0000 meq | EXTENDED_RELEASE_TABLET | ORAL | Status: AC
  Administered 2018-04-21: 40 meq via ORAL
  Filled 2018-04-21 (×2): qty 2

## 2018-04-21 NOTE — Progress Notes (Signed)
CSW continuing to follow.  Shaundrea Carrigg LCSW [phone]  

## 2018-04-21 NOTE — Progress Notes (Signed)
ANTICOAGULATION CONSULT NOTE - Follow Up Consult  Pharmacy Consult for Heparin Indication: DVT  Allergies  Allergen Reactions  . Fentanyl Anaphylaxis and Shortness Of Breath  . Lactose Intolerance (Gi) Other (See Comments)    G.I. Upset  . Butrans [Buprenorphine] Rash and Other (See Comments)    Infected skin underneath application  . Penicillins Rash    Facial rash Has patient had a PCN reaction causing immediate rash, facial/tongue/throat swelling, SOB or lightheadedness with hypotension: Yes Has patient had a PCN reaction causing severe rash involving mucus membranes or skin necrosis: No Has patient had a PCN reaction that required hospitalization No Has patient had a PCN reaction occurring within the last 10 years: Yes If all of the above answers are "NO", then may proceed with Cephalosporin use.   . Simvastatin Rash     Labs: Recent Labs    04/19/18 0419 04/20/18 0504 04/21/18 0406  HGB 9.0* 8.6* 8.7*  HCT 27.1* 26.2* 26.1*  PLT 239 292 334  HEPARINUNFRC 0.51 0.61 0.57  CREATININE 1.07*  --  1.31*    Estimated Creatinine Clearance: 50.8 mL/min (A) (by C-G formula based on SCr of 1.31 mg/dL (H)).  Assessment: 72 yo F with acute RUE DVT from PICC. IV heparin. HL 057 on Heparin IV drip 1500 units/hr.  CBC low but stable. No bleeding reported.   Goal of Therapy:  Heparin level 0.3-0.7 units/ml Monitor platelets by anticoagulation protocol: Yes   Plan:  Continue Heparin at 1500 units/hr Daily HL and CBC  Nicole Cella, RPh Clinical Pharmacist Please check AMION for all [place] phone numbers After 10:00 PM, call [place] [phone] 04/21/2018 10:56 AM

## 2018-04-21 NOTE — Progress Notes (Signed)
Nutrition Follow-up  DOCUMENTATION CODES:   Morbid obesity  INTERVENTION:  -If unable to advance diet recommend initiation of TPN given inadequate nutrition since initial admission to [facility] 10/26  -D/c Ensure and Glucerna supplements, pt is on clear liquid diet and does not like them   NUTRITION DIAGNOSIS:   Inadequate oral intake related to inability to eat as evidenced by energy intake < or equal to 50% for > or equal to 5 days(NPO/clear liquid status >5 days).  Ongoing  GOAL:   Patient will meet greater than or equal to 90% of their needs  Not met   MONITOR:   PO intake, Diet advancement, Labs  REASON FOR ASSESSMENT:   Malnutrition Screening Tool    ASSESSMENT:   72 year old female with PMH significant for CAD, HLD/HTN, CVA, T2DM, RA, and fibromyalgia/chronic pain. Pt presented w/ 2 weeks of lower abdominal pain. Had recently been dx w/ UTI and palced on abx, but pain continued. Initial CT scan showed diverticulitis w/o perf, but pt developed hypotension and repeat CT did show perforation. Surgery consulted.  10/26 Admitted to [facility] with sepsis with pneumopreperitoneum secondary to worsening perforated sigmoid diverticulitis with multiple fluid collections and multiple abscesses.   11/5 transferred to [facility]  11/6 s/p drainage of diverticular abscesses   Visited pt at bedside with family members present. Pt has lost appetite and is not wanting to drink or eat sweet foods. She is also not liking broth and will not eat it. Pt has been NPO or clear liquid for majority of admission. She expresses desire to eat foods such as toast, scrambled eggs, coffee, which she cannot have on current diet. Educated family on the reasoning behind bowel rest in the setting of diverticulitis.   10/26-10/31 NPO 10/31 - advanced to clear liquid diet 11/1 - advanced to full liquid diet 11/5 - advanced to soft diet (2 meals) 11/6 - NPO  11/7-11/11 - clear liquid diet   Pt has  had orders for Ensure, Glucerna, Boost Breeze, and has said she doesn't like any of them and is tired of drinking sweet things. Pt also not drinking the broth and is tired of it. Pt has insufficient protein intake throughout hospitalization.   Considering pt's lack of intake since 10/26, supplements cannot be used to provide complete nutrition. If diet cannot be advanced, TPN is indicated for this pt.   Meds: lasix, Protonix Labs: CBGs 89-121, potassium 3.3, Cr 1.31, magnesium 1.5 (11/9), GFR 39   Diet Order:   Diet Order            Diet bariatric clear liquid Room service appropriate? Yes; Fluid consistency: Thin  Diet effective now              EDUCATION NEEDS:   Education needs have been addressed  Skin:  Skin Assessment: Reviewed RN Assessment  Last BM:  11/11  Height:   Ht Readings from Last 1 Encounters:  04/16/18 5' 5.5" (1.664 m)    Weight:   Wt Readings from Last 1 Encounters:  04/19/18 123 kg    Ideal Body Weight:  56.82 kg  BMI:  Body mass index is 44.44 kg/m.  Estimated Nutritional Needs:   Kcal:  1600-1800 (MSJ +/- 100)  Protein:  85-100g Pro (1.5-1.8g/kg ibw)  Fluid:  1.6-1.8 L fluid (42m/kcal)    Armistead Sult, Dietetic Intern [phone]

## 2018-04-21 NOTE — Progress Notes (Signed)
Referring Physician(s): Dr. Kae Heller  Supervising Physician: Dr. Annamaria Boots  Patient Status:  [facility] - In-pt  Chief Complaint: Follow-up abdominal drains x 2 placed 11/6 by Dr. Barbie Banner  Subjective:  Still complains of abdominal pain. Husband at bedside Not much appetite. Passing flatus and a few BMs  Allergies: Fentanyl; Lactose intolerance (gi); Butrans [buprenorphine]; Penicillins; and Simvastatin  Medications:  Current Facility-Administered Medications:  .  acetaminophen (TYLENOL) tablet 650 mg, 650 mg, Oral, Q6H PRN, 650 mg at 04/19/18 1641 **OR** acetaminophen (TYLENOL) suppository 650 mg, 650 mg, Rectal, Q6H PRN, Tat, David, MD .  albuterol (PROVENTIL) (2.5 MG/3ML) 0.083% nebulizer solution 2.5 mg, 2.5 mg, Nebulization, Q6H PRN, Elgergawy, Silver Huguenin, MD .  Chlorhexidine Gluconate Cloth 2 % PADS 6 each, 6 each, Topical, Daily, Tat, Shanon Brow, MD, 6 each at 04/21/18 0951 .  escitalopram (LEXAPRO) tablet 10 mg, 10 mg, Oral, QHS, Elgergawy, Silver Huguenin, MD, 10 mg at 04/20/18 2217 .  feeding supplement (ENSURE ENLIVE) (ENSURE ENLIVE) liquid 237 mL, 237 mL, Oral, TID BM, Elgergawy, Silver Huguenin, MD, 237 mL at 04/21/18 0947 .  feeding supplement (GLUCERNA SHAKE) (GLUCERNA SHAKE) liquid 237 mL, 237 mL, Oral, TID BM, Elgergawy, Dawood S, MD .  furosemide (LASIX) injection 40 mg, 40 mg, Intravenous, Daily, Elgergawy, Silver Huguenin, MD, 40 mg at 04/21/18 0942 .  heparin ADULT infusion 100 units/mL (25000 units/255mL sodium chloride 0.45%), 1,500 Units/hr, Intravenous, Continuous, Karren Cobble, RPH, Last Rate: 15 mL/hr at 04/21/18 0935, 1,500 Units/hr at 04/21/18 0935 .  hydrALAZINE (APRESOLINE) injection 10 mg, 10 mg, Intravenous, Q6H PRN, Emokpae, Courage, MD .  HYDROcodone-acetaminophen (NORCO) 10-325 MG per tablet 1 tablet, 1 tablet, Oral, Q6H PRN, Roney Jaffe, MD, 1 tablet at 04/20/18 1641 .  latanoprost (XALATAN) 0.005 % ophthalmic solution 1 drop, 1 drop, Both Eyes, QHS, Tat, David, MD, 1 drop at  04/20/18 2218 .  meropenem (MERREM) 1 g in sodium chloride 0.9 % 100 mL IVPB, 1 g, Intravenous, Q8H, Roney Jaffe, MD, Last Rate: 200 mL/hr at 04/21/18 0936, 1 g at 04/21/18 0936 .  metoprolol tartrate (LOPRESSOR) tablet 25 mg, 25 mg, Oral, BID, Emokpae, Courage, MD, 25 mg at 04/21/18 0957 .  nystatin (MYCOSTATIN/NYSTOP) topical powder, , Topical, TID, Elgergawy, Silver Huguenin, MD .  ondansetron (ZOFRAN) injection 4 mg, 4 mg, Intravenous, Q6H PRN, Tat, David, MD, 4 mg at 04/18/18 1415 .  oxyCODONE (Oxy IR/ROXICODONE) immediate release tablet 2.5 mg, 2.5 mg, Oral, Q6H PRN, Elgergawy, Silver Huguenin, MD, 2.5 mg at 04/21/18 0836 .  pantoprazole (PROTONIX) EC tablet 40 mg, 40 mg, Oral, Daily, Tat, David, MD, 40 mg at 04/21/18 0942 .  pravastatin (PRAVACHOL) tablet 40 mg, 40 mg, Oral, QHS, Tat, Shanon Brow, MD, 40 mg at 04/20/18 2217 .  pregabalin (LYRICA) capsule 50 mg, 50 mg, Oral, QHS, Elgergawy, Silver Huguenin, MD, 50 mg at 04/20/18 2217 .  senna-docusate (Senokot-S) tablet 2 tablet, 2 tablet, Oral, QHS PRN, Roney Jaffe, MD .  sodium chloride flush (NS) 0.9 % injection 10-40 mL, 10-40 mL, Intracatheter, Q12H, Tat, David, MD, 10 mL at 04/20/18 2221 .  sodium chloride flush (NS) 0.9 % injection 10-40 mL, 10-40 mL, Intracatheter, PRN, Tat, David, MD .  sodium chloride flush (NS) 0.9 % injection 5 mL, 5 mL, Intracatheter, Q8H, Hoss, Arthur, MD, 5 mL at 04/21/18 0541 .  temazepam (RESTORIL) capsule 30 mg, 30 mg, Oral, Once, Blount, Xenia T, NP .  timolol (TIMOPTIC) 0.5 % ophthalmic solution 1 drop, 1 drop, Both Eyes, BID, Tat,  Shanon Brow, MD, 1 drop at 04/21/18 0949 .  traMADol (ULTRAM) tablet 50 mg, 50 mg, Oral, Q12H PRN, Elgergawy, Silver Huguenin, MD    Vital Signs: BP 134/65   Pulse 86   Temp 98.6 F (37 C) (Oral)   Resp 15   Ht 5' 5.5" (1.664 m)   Wt 123 kg   SpO2 97%   BMI 44.44 kg/m   Physical Exam  Constitutional: No distress.  Cardiovascular: Normal rate, regular rhythm and normal heart sounds.    Pulmonary/Chest: Effort normal and breath sounds normal.  Abdominal: Soft. There is tenderness.  Abdominal drains x 2 with purulent output  Neurological: She is alert.  Skin: Skin is warm and dry. She is not diaphoretic.  Nursing note and vitals reviewed.   Imaging: No results found.  Labs:  CBC: Recent Labs    04/18/18 0309 04/19/18 0419 04/20/18 0504 04/21/18 0406  WBC 8.5 7.2 7.4 7.8  HGB 8.8* 9.0* 8.6* 8.7*  HCT 27.7* 27.1* 26.2* 26.1*  PLT 202 239 292 334    COAGS: Recent Labs    04/06/18 1952  INR 1.22  APTT 33    BMP: Recent Labs    04/17/18 0443 04/18/18 0309 04/19/18 0419 04/21/18 0406  NA 137 138 138 138  K 3.1* 3.1* 3.5 3.3*  CL 100 99 100 97*  CO2 28 28 30 31   GLUCOSE 78 106* 96 91  BUN 21 15 20 20   CALCIUM 7.5* 7.3* 7.4* 7.6*  CREATININE 1.08* 1.09* 1.07* 1.31*  GFRNONAA 50* 49* 50* 39*  GFRAA 58* 57* 58* 46*    LIVER FUNCTION TESTS: Recent Labs    04/08/18 0415 04/09/18 0355 04/10/18 0351 04/15/18 0859  BILITOT 0.6 0.5 0.7 1.0  AST 46* 54* 41 69*  ALT 33 36 30 43  ALKPHOS 48 55 51 63  PROT 5.6* 5.2* 5.5* 5.6*  ALBUMIN 2.3* 2.2* 2.2* 2.1*    Assessment and Plan:  S/p abdominal abscess drains x 2 placed 11/6 in IR by Dr. Barbie Banner. Patient continues to have abdominal pain which surgery is following and considering possible further surgical intervention per chart. Right TG drain with 15 cc purulent output last 24 hours, left abdominal drain with 45 cc purulent output in last 24 hours. Cultures so far show gram positive cocci in clusters and gram positive rods - she is currently on meropenem per primary team.   Afebrile, WBC 7.4, hgb 8.6.  Continue current management - IR will continue to follow.  Possible repeat scan soon.  Electronically Signed: Ascencion Dike, PA-C 04/21/2018, 10:52 AM   I spent a total of 15 Minutes at the the patient's bedside AND on the patient's hospital floor or unit, greater than 50% of which was  counseling/coordinating care for abdominal/pelvic abscess drains x2.

## 2018-04-21 NOTE — Progress Notes (Signed)
Physical Therapy Treatment Patient Details Name: [name] MRN: [id number] DOB: [birth year] Today's Date: 04/21/2018    History of Present Illness [name]  is a 72 y.o. female, w hypertension, hyperlipidemia, dm2, pvd, CVA, OSA, Rheumatoid arthritis apparently c/o LLQ pain over the past several weeks, just started on abx yesterday, pt notes subjective fever as well as nausea.  Pt denies emesis, constipation, diarrhea, brbpr, black stool.  Pt presented due to worsening abdominal pain today. Found to have acute perforated sigmoid diverticulitis with multiple abscesses. Pt is s/p abdominal abscess drains X2.     PT Comments    Pt very limited by back pain this session. Pt yelling and very tearful with any kind of movement. Attempted to sit at EOB, however, pt unable. Did roll side<>side with min A for clean up following BM. Printed and reviewed supine HEP for pt to perform throughout the day. Access code: [email or web address] on [place]. Current recommendations appropriate. Will continue to follow acutely to maximize functional mobility independence and safety.   Follow Up Recommendations  SNF     Equipment Recommendations  None recommended by PT    Recommendations for Other Services       Precautions / Restrictions Precautions Precautions: Fall;Other (comment) Precaution Comments: 2 abscess drains Restrictions Weight Bearing Restrictions: No    Mobility  Bed Mobility Overal bed mobility: Needs Assistance Bed Mobility: Rolling Rolling: Min assist         General bed mobility comments: Min A to roll from side to side for clean up following small BM. Attempted to sit at EOB, however, pt yelling and tearful secondary to pain in back. Further mobility deferred.   Transfers                    Ambulation/Gait                 Stairs             Wheelchair Mobility    Modified Rankin (Stroke Patients Only)       Balance                                             Cognition Arousal/Alertness: Awake/alert Behavior During Therapy: WFL for tasks assessed/performed Overall Cognitive Status: Within Functional Limits for tasks assessed                                        Exercises Other Exercises Other Exercises: Reviewed and printed out supine HEP for pt to perform throughout the day. Access code: [email or web address] on [place].     General Comments General comments (skin integrity, edema, etc.): Pt wanting to get into WC, so brought WC up in room, however, pt with increased pain with movement, so further mobility deferred.       Pertinent Vitals/Pain Pain Assessment: Faces Faces Pain Scale: Hurts whole lot Pain Location: back pain  Pain Descriptors / Indicators: Sharp;Constant;Crying Pain Intervention(s): Limited activity within patient's tolerance;Monitored during session;Repositioned    Home Living                      Prior Function            PT Goals (current goals can now be found in the care  plan section) Acute Rehab PT Goals Patient Stated Goal: to decrease back pain  PT Goal Formulation: With patient/family Time For Goal Achievement: 04/28/18 Potential to Achieve Goals: Fair Progress towards PT goals: Progressing toward goals    Frequency    Min 3X/week      PT Plan Current plan remains appropriate    Co-evaluation              AM-PAC PT "6 Clicks" Daily Activity  Outcome Measure  Difficulty turning over in bed (including adjusting bedclothes, sheets and blankets)?: Unable Difficulty moving from lying on back to sitting on the side of the bed? : Unable Difficulty sitting down on and standing up from a chair with arms (e.g., wheelchair, bedside commode, etc,.)?: Unable Help needed moving to and from a bed to chair (including a wheelchair)?: A Lot Help needed walking in hospital room?: Total Help needed climbing 3-5 steps with a railing? : Total 6 Click  Score: 7    End of Session   Activity Tolerance: Patient limited by pain Patient left: in bed;with call bell/phone within reach;with bed alarm set;with family/visitor present Nurse Communication: Mobility status PT Visit Diagnosis: Unsteadiness on feet (R26.81);Muscle weakness (generalized) (M62.81)     Time: 2122-4825 PT Time Calculation (min) (ACUTE ONLY): 37 min  Charges:  $Therapeutic Activity: 23-37 mins                     Leighton Ruff, PT, DPT  Acute Rehabilitation Services  Pager: [phone] Office: [phone]    Rudean Hitt 04/21/2018, 1:15 PM

## 2018-04-21 NOTE — Progress Notes (Signed)
Received call from tele that patient has had frequent pairs of PVC's and a 3 beat run of V-tach this morning. Dr. Waldron Labs notified. Will continue to monitor.

## 2018-04-21 NOTE — Progress Notes (Signed)
Patient Demographics:    [name], is a 72 y.o. female, DOB - 04/02/1946, HQI:[id number]  Admit date - 04/05/2018   Admitting Physician [name], MD  Outpatient Primary MD for the patient is [name], MD  LOS - 59   Chief Complaint  Patient presents with  . Abdominal Pain      Brief narrative:  72yo female PMH HTN, DM-II, CKD, RA, H/o CAD s/p CABG (on plavix,), who was admitted to [facility] 10/26 with acute sigmoid diverticulitis without perforation or abscess. Initially felt to be improving on IV merrem, but she continued to have abdominal pain, CT abdomen pelvis was obtained 04/15/2018, was significant for worsening diverticulitis now with multiple abdominal fluid collections.  Patient developed acute right upper extremity DVT in the setting of PICC line, she was transferred from [facility] to [facility] 04/15/2018 for evaluation by IR.  She has drain placed by IR 04/16/2018   Subjective:    [name] denies any fever or chills overnight, no further diarrhea, reports abdominal pain at baseline, reports lower back pain   Assessment  & P lan :    Principal Problem:   Diverticulitis Active Problems:   DM (diabetes mellitus) ([place])   HTN (hypertension)   Rheumatoid arthritis ([place])   Anemia   Renal insufficiency   Sepsis due to undetermined organism ([place])   CKD (chronic kidney disease) stage 3, GFR 30-59 ml/min ([facility])   Fibromyalgia   Diverticulitis large intestine w/o perforation or abscess w/o bleeding   Acute diverticulitis   Perforated sigmoid colon ([place])   Acute renal failure superimposed on stage 3 chronic kidney disease ([place])   Sepsis due to acute perforated sigmoid diverticulitis- -sepsis criteria met on admission - imaging 04/06/2018 on admission significant for perforated sigmoid diverticulitis, was managed conservatively on IV meropenem, clear liquid diet, . -Repeat imaging  04/15/2018 with large abscesses in the abdomen, transferred to [facility] for further care, general surgery consult greatly appreciated. -IR consult appreciated, status post drainage x2, for her diverticular abscess, with with drain, Gram stain showing gram-positive cocci, gram-positive rods as well, she is on meropenem, have discussed with ID, recommendation to continue with current regimen, no need to add new antibiotics.  Acute right upper extremity DVT -In the setting of PICC line insertion, start on anticoagulation, continue with heparin for anticoagulation until no surgery or further procedure is indicated. -PICC line  discontinued 04/16/2018  acute kidney injury on CKD3. Baseline creat 1.2.   - AKI resolved w/ IVF but creat down to 0.7 now, appears to be volume overloaded, continue with diuresis as blood pressure tolerates.  Fibromyalgia -Discussed with the patient, will start on Lyrica which she was taking at home  Hypertension -Continue with metoprolol, given soft blood pressure continue to hold home medication including as  isosorbide/ olmesartan/amlodipine/HCTZ combo -Patient's blood pressure has improved, within normal limit, so no need to resume any antihypertensive meds right now, as I will continue with IV diuresis, as so far she is +20 L during this hospital stay.   DM2 -   BS's are good, cont Novolog/Humalog Sliding scale insulin  H/o CAD sp CABG /hx TIA - previously on Plavix and pravastatin, continue to hold Plavix may need further procedures or  surgery  Debility - needs to get OOB , PT seeing patient , SNF recommended, SW consulted   acute on chronic anemia--- sp 2 u prbc's,   Started on Lexapro for depression  hypoKalemia -repleted,  cont to return especially if she is on diuresis  Code Status : full  Disposition Plan  : TBD, will prob need SNF  Consults  :  Gen Surgery, IR  DVT Prophylaxis  :   SCDs, heparin GTT   Lab Results  Component Value Date   PLT  334 04/21/2018    Inpatient Medications  Scheduled Meds: . Chlorhexidine Gluconate Cloth  6 each Topical Daily  . escitalopram  20 mg Oral QHS  . feeding supplement (ENSURE ENLIVE)  237 mL Oral TID BM  . feeding supplement (GLUCERNA SHAKE)  237 mL Oral TID BM  . furosemide  40 mg Intravenous Daily  . latanoprost  1 drop Both Eyes QHS  . metoprolol tartrate  25 mg Oral BID  . nystatin   Topical TID  . pantoprazole  40 mg Oral Daily  . potassium chloride  40 mEq Oral Q4H  . pravastatin  40 mg Oral QHS  . pregabalin  50 mg Oral QHS  . sodium chloride flush  10-40 mL Intracatheter Q12H  . sodium chloride flush  5 mL Intracatheter Q8H  . temazepam  30 mg Oral Once  . timolol  1 drop Both Eyes BID   Continuous Infusions: . heparin 1,500 Units/hr (04/21/18 0935)  . meropenem (MERREM) IV 1 g (04/21/18 0936)   PRN Meds:.acetaminophen **OR** acetaminophen, albuterol, hydrALAZINE, HYDROcodone-acetaminophen, ondansetron (ZOFRAN) IV, oxyCODONE, senna-docusate, sodium chloride flush, traMADol    Anti-infectives (From admission, onward)   Start     Dose/Rate Route Frequency Ordered Stop   04/15/18 1700  meropenem (MERREM) 1 g in sodium chloride 0.9 % 100 mL IVPB     1 g 200 mL/hr over 30 Minutes Intravenous Every 8 hours 04/15/18 1534     04/09/18 2200  meropenem (MERREM) 1 g in sodium chloride 0.9 % 100 mL IVPB  Status:  Discontinued     1 g 200 mL/hr over 30 Minutes Intravenous Every 8 hours 04/09/18 1244 04/14/18 0944   04/06/18 1300  meropenem (MERREM) 1 g in sodium chloride 0.9 % 100 mL IVPB  Status:  Discontinued     1 g 200 mL/hr over 30 Minutes Intravenous Every 12 hours 04/06/18 1145 04/09/18 1244   04/06/18 1145  meropenem (MERREM) 1 g in sodium chloride 0.9 % 100 mL IVPB  Status:  Discontinued     1 g 200 mL/hr over 30 Minutes Intravenous Every 8 hours 04/06/18 1139 04/06/18 1145   04/06/18 0900  ciprofloxacin (CIPRO) IVPB 400 mg  Status:  Discontinued     400 mg 200 mL/hr  over 60 Minutes Intravenous Every 12 hours 04/06/18 0732 04/06/18 1139   04/06/18 0300  metroNIDAZOLE (FLAGYL) IVPB 500 mg  Status:  Discontinued     500 mg 100 mL/hr over 60 Minutes Intravenous Every 8 hours 04/05/18 2335 04/06/18 1139   04/05/18 1930  ciprofloxacin (CIPRO) IVPB 400 mg     400 mg 200 mL/hr over 60 Minutes Intravenous  Once 04/05/18 1925 04/05/18 2217   04/05/18 1930  metroNIDAZOLE (FLAGYL) IVPB 500 mg     500 mg 100 mL/hr over 60 Minutes Intravenous  Once 04/05/18 1925 04/05/18 2102        Objective:   Vitals:   04/21/18 0451 04/21/18 0452 04/21/18 0957  04/21/18 1418  BP: (!) 124/57  134/65 (!) 120/44  Pulse: 85  86 84  Resp: 15   18  Temp: 98.6 F (37 C)   98.6 F (37 C)  TempSrc: Oral     SpO2: (!) 89% 97%  98%  Weight:      Height:        Wt Readings from Last 3 Encounters:  04/19/18 123 kg  02/13/18 116.1 kg  01/13/18 120.7 kg     Intake/Output Summary (Last 24 hours) at 04/21/2018 1647 Last data filed at 04/21/2018 1014 Gross per 24 hour  Intake 938.41 ml  Output 718 ml  Net 220.41 ml     Physical Exam  Awake Alert, Oriented X 3, No new F.N deficits, Normal affect Symmetrical Chest wall movement, Good air movement bilaterally, CTAB RRR,No Gallops,Rubs or new Murmurs, No Parasternal Heave +ve B.Sounds, Abd Soft, mild tenderness, has abdominal drain anteriorly, and another one posteriorly, No rebound - guarding or rigidity. No Cyanosis, Clubbing , lower extremity edema significantly improved, and her right upper extremity edema significantly improved today as well        Data Review:   Micro Results Recent Results (from the past 240 hour(s))  Aerobic/Anaerobic Culture (surgical/deep wound)     Status: Abnormal (Preliminary result)   Collection Time: 04/16/18  3:13 PM  Result Value Ref Range Status   Specimen Description PELVIS ABSCESS  Final   Special Requests NONE  Final   Gram Stain   Final    FEW WBC PRESENT,BOTH PMN AND  MONONUCLEAR FEW GRAM POSITIVE COCCI IN CLUSTERS FEW GRAM POSITIVE RODS Performed at [facility], [place] [address], [place], [place] [address]    Culture (A)  Final    MULTIPLE ORGANISMS PRESENT, NONE PREDOMINANT NO ANAEROBES ISOLATED; CULTURE IN PROGRESS FOR 5 DAYS    Report Status PENDING  Incomplete  Aerobic/Anaerobic Culture (surgical/deep wound)     Status: None (Preliminary result)   Collection Time: 04/16/18  3:13 PM  Result Value Ref Range Status   Specimen Description ABSCESS PELVIS  Final   Special Requests SAMPLE NO 2  Final   Gram Stain   Final    FEW WBC PRESENT,BOTH PMN AND MONONUCLEAR NO ORGANISMS SEEN Performed at [facility], [place] [address] [place], [place] [address]    Culture   Final    CULTURE REINCUBATED FOR BETTER GROWTH NO ANAEROBES ISOLATED; CULTURE IN PROGRESS FOR 5 DAYS   Report Status PENDING  Incomplete    Radiology Reports Ct Abdomen Pelvis Wo Contrast  Result Date: 04/15/2018 CLINICAL DATA:  Acute lower abdominal pain. EXAM: CT ABDOMEN AND PELVIS WITHOUT CONTRAST TECHNIQUE: Multidetector CT imaging of the abdomen and pelvis was performed following the standard protocol without IV contrast. COMPARISON:  CT scan of April 06, 2018. FINDINGS: Lower chest: No acute abnormality. Hepatobiliary: No focal liver abnormality is seen. Status post cholecystectomy. No biliary dilatation. Pancreas: Unremarkable. No pancreatic ductal dilatation or surrounding inflammatory changes. Spleen: Normal in size without focal abnormality. Adrenals/Urinary Tract: Adrenal glands appear normal. Small nonobstructive right renal calculus is noted. No hydronephrosis or renal obstruction is noted. Urinary bladder is unremarkable. Stomach/Bowel: The stomach and appendix are unremarkable. Sigmoid diverticulosis is noted. Some degree of residual diverticulitis cannot be excluded. However, 7.3 x 5.8 cm fluid collection is noted in left lower quadrant, which communicates with  smaller fluid collection measuring 5.1 x 3.6 cm. This is concerning for abscess status post previous perforated diverticulitis. Air-fluid collection  measuring 8.2 x 5.9 cm is seen in the right lower quadrant also concerning for possible abscess. 8.9 x 5.3 cm fluid and stool collection is seen posterior to the uterus and anterior to the rectum concerning for abscess or contained perforation. Vascular/Lymphatic: Aortic atherosclerosis. No enlarged abdominal or pelvic lymph nodes. Reproductive: Uterus and bilateral adnexa are unremarkable. Other: Stable 6.9 x 4.5 cm fluid collection in left inguinal region. This most likely is benign. Musculoskeletal: No acute or significant osseous findings. IMPRESSION: There are at least 3 fluid collections and probable abscesses seen in the lower abdomen and pelvis, following previously perforated diverticulitis. 7.3 x 5.8 cm fluid collection is noted in left lower quadrant, as well as air-fluid collection measuring 8.2 x 5.9 cm seen in the right lower quadrant. Also noted is 8.9 x 5.3 cm fluid collection in the pelvis in the pre rectal space which appears to contain stool and fluid. These results will be called to the ordering clinician or representative by the Radiologist Assistant, and communication documented in the PACS or zVision Dashboard. Small nonobstructive right renal calculus. Aortic Atherosclerosis (ICD10-I70.0). Electronically Signed   By: Marijo Conception, M.D.   On: 04/15/2018 14:10   Ct Abdomen Pelvis Wo Contrast  Result Date: 04/06/2018 CLINICAL DATA:  72 year old female with concern for perforated diverticulitis. EXAM: CT ABDOMEN AND PELVIS WITHOUT CONTRAST TECHNIQUE: Multidetector CT imaging of the abdomen and pelvis was performed following the standard protocol without IV contrast. COMPARISON:  Chest radiograph dated 04/06/2018 and CT dated 04/05/2018 FINDINGS: Evaluation of this exam is limited in the absence of intravenous contrast. Lower chest: Diffuse  interstitial coarsening and streaky atelectasis/scarring of the lung bases. There is mild cardiomegaly. There is hypoattenuation of the cardiac blood pool suggestive of a degree of anemia. Clinical correlation is recommended. There is pneumoperitoneum and small amount of free fluid within the pelvis. Hepatobiliary: Advanced fatty infiltration of the liver. No intrahepatic biliary ductal dilatation. Cholecystectomy. Pancreas: Unremarkable. No pancreatic ductal dilatation or surrounding inflammatory changes. Spleen: Normal in size without focal abnormality. Adrenals/Urinary Tract: The adrenal glands are unremarkable. There is no hydronephrosis on either side. Excreted contrast in the renal collecting systems bilaterally. The visualized ureters appear unremarkable. There is trabeculated appearance of the bladder wall likely related to chronic bladder dysfunction. Correlation with urinalysis recommended to exclude cystitis. Stomach/Bowel: There is extensive sigmoid diverticulosis. There is associated inflammatory changes and perforation of the sigmoid colon. There is increased size of the extraluminal air since the prior CT. No drainable fluid collection is noted at this time. There is no bowel obstruction. Normal appendix. Vascular/Lymphatic: There is advanced aortoiliac atherosclerotic disease. No portal venous gas. There is no adenopathy. Reproductive: The uterus is grossly unremarkable. Other: None Musculoskeletal: Degenerative changes of the spine. No acute osseous pathology. IMPRESSION: 1. Perforated sigmoid diverticulitis with increased size of the extraluminal air since the prior CT. No drainable fluid collection noted at this time. 2. No bowel obstruction. Normal appendix. 3. Advanced fatty infiltration of the liver. These results were called by telephone at the time of interpretation on 04/06/2018 at 11:09 pm to nurse Hearn, who verbally acknowledged these results. Electronically Signed   By: Anner Crete  M.D.   On: 04/06/2018 23:25   Dg Chest 2 View  Result Date: 04/06/2018 CLINICAL DATA:  Dyspnea, sepsis, pt listless and unable to cooperate EXAM: CHEST - 2 VIEW COMPARISON:  06/27/2016 and 06/18/2016. FINDINGS: On the lateral view, there is evidence of a small amount free intraperitoneal air under the  left hemidiaphragm. Lung volumes are low. There is opacity at the bases that is likely due to atelectasis along with bronchovascular crowding. No convincing pneumonia and no evidence of pulmonary edema. 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 Cardiac silhouette is normal in size. No mediastinal hilar masses. No pleural effusion.  No pneumothorax. Skeletal structures are grossly intact. IMPRESSION: 1. Small amount of free intraperitoneal air. Recommend follow-up abdomen and pelvis CT with contrast for further assessment. 2. Somewhat limited study due to low lung volumes. Allowing for this, no acute cardiopulmonary disease. Electronically Signed   By: Lajean Manes M.D.   On: 04/06/2018 11:23   Ct Abdomen Pelvis W Contrast  Result Date: 04/05/2018 CLINICAL DATA:  Patient has bilateral lower abdominal sharp intermittent pain with nausea without vomiting. H/x diverticulitis patient states pain and symptoms similar to past. EXAM: CT ABDOMEN AND PELVIS WITH CONTRAST TECHNIQUE: Multidetector CT imaging of the abdomen and pelvis was performed using the standard protocol following bolus administration of intravenous contrast. CONTRAST:  20m ISOVUE-300 IOPAMIDOL (ISOVUE-300) INJECTION 61% COMPARISON:  02/06/2018 FINDINGS: Lower chest: There is scarring in both lung bases. Coronary artery calcifications are present. The heart is normal in size. Hepatobiliary: Liver is diffusely low attenuation. No focal liver lesions. Cholecystectomy. Pancreas: Unremarkable. No pancreatic ductal dilatation or surrounding inflammatory changes. Spleen: Normal in size without focal abnormality. Adrenals/Urinary Tract: Adrenal glands are  normal. There is symmetric enhancement and excretion from the kidneys. 1 millimeter intrarenal calcification identified in the LOWER pole of the RIGHT kidney, not associated with obstruction. Ureters are unremarkable. The bladder and visualized portion of the urethra are normal. Stomach/Bowel: The stomach is normal in appearance. Small bowel loops are normal in caliber and wall thickness. The appendix is well seen and has a normal appearance. There is significant inflammatory change in the region of sigmoid colon and associated numerous diverticula in this segment. Fluid extends in the sigmoid mesentery. The diseased segment of sigmoid is adjacent to the uterus and the urinary bladder. No evidence for abscess or perforation. Vascular/Lymphatic: There is atherosclerotic calcification of the abdominal aorta. There is normal vascular opacification of the celiac axis, superior mesenteric artery, and inferior mesenteric artery. Normal appearance of the portal venous system and inferior vena cava. Reproductive: The uterus is present and is surrounded by inflammatory changes from the sigmoid diverticulitis. No adnexal mass. Other: Trace amount of free pelvic fluid. Musculoskeletal: Incompletely imaged fluid attenuation collection within the LEFT hip abductors, measuring at least 7.3 x 4.8 centimeters. The appearance is stable since 2017. There are degenerative changes in the lumbar spine with 7 millimeters anterolisthesis of L4 on L5. Median sternotomy. IMPRESSION: 1. Acute sigmoid diverticulitis with significant fluid in the sigmoid mesentery. There is no evidence for perforation or abscess. 2. Coronary artery calcifications. 3. Hepatic steatosis.  Cholecystectomy. 4. Nonobstructing intrarenal calculus in the LOWER pole the RIGHT kidney. 5. Normal appendix. 6.  Aortic atherosclerosis. 7. Probably benign ganglion cyst within the LEFT hip abductors. Electronically Signed   By: ENolon NationsM.D.   On: 04/05/2018 22:06    [place]Venous Img Upper Uni Right  Result Date: 04/15/2018 CLINICAL DATA:  None EXAM: RIGHT UPPER EXTREMITY VENOUS DOPPLER ULTRASOUND TECHNIQUE: Gray-scale sonography with graded compression, as well as color Doppler and duplex ultrasound were performed to evaluate the upper extremity deep venous system from the level of the subclavian vein and including the jugular, axillary, basilic, radial, ulnar and upper cephalic vein. Spectral Doppler was utilized to evaluate flow at rest and with  distal augmentation maneuvers. COMPARISON:  None. FINDINGS: Contralateral Subclavian Vein: Respiratory phasicity is normal and symmetric with the symptomatic side. No evidence of thrombus. Normal compressibility. Internal Jugular Vein: No evidence of thrombus. Normal compressibility, respiratory phasicity and response to augmentation. Subclavian Vein: No evidence of thrombus. Normal compressibility, respiratory phasicity and response to augmentation. Axillary Vein: No evidence of thrombus. Normal compressibility, respiratory phasicity and response to augmentation. Cephalic Vein: No evidence of thrombus. Normal compressibility, respiratory phasicity and response to augmentation. Basilic Vein: No evidence of thrombus. Normal compressibility, respiratory phasicity and response to augmentation. Brachial Veins: Occlusive thrombus of the right brachial vein, paired brachial veins. Radial Veins: No evidence of thrombus. Normal compressibility, respiratory phasicity and response to augmentation. Ulnar Veins: No evidence of thrombus. Normal compressibility, respiratory phasicity and response to augmentation. Other Findings:  Edema IMPRESSION: Sonographic survey of the right upper extremity is positive for occlusive DVT of the paired right brachial vein. Electronically Signed   By: Corrie Mckusick D.O.   On: 04/15/2018 14:17   Dg Chest Port 1 View  Result Date: 04/11/2018 CLINICAL DATA:  PICC placement. EXAM: PORTABLE CHEST 1 VIEW  COMPARISON:  Chest radiograph 04/06/2018. FINDINGS: Cardiomegaly.  Mild vascular congestion.  Prior CABG. RIGHT arm PICC line tip appears to lie distal SVC. This is marked with an arrow. No pneumothorax. IMPRESSION: RIGHT arm PICC line tip appears to lie distal SVC. No pneumothorax. Cardiomegaly with mild vascular congestion, stable from priors. Electronically Signed   By: Staci Righter M.D.   On: 04/11/2018 09:34   Ct Image Guided Fluid Drain By Catheter  Result Date: 04/16/2018 INDICATION: Pelvic abscess x2 EXAM: PELVIC ABSCESS DRAIN X2 CT-GUIDED MEDICATIONS: The patient is currently admitted to the hospital and receiving intravenous antibiotics. The antibiotics were administered within an appropriate time frame prior to the initiation of the procedure. ANESTHESIA/SEDATION: Fentanyl 0 mcg IV; Versed 4 mg IV, 4 mg morphine sulfate Moderate Sedation Time:  53 minutes The patient was continuously monitored during the procedure by the interventional radiology nurse under my direct supervision. COMPLICATIONS: None immediate. PROCEDURE: Informed written consent was obtained from the patient after a thorough discussion of the procedural risks, benefits and alternatives. All questions were addressed. Maximal Sterile Barrier Technique was utilized including caps, mask, sterile gowns, sterile gloves, sterile drape, hand hygiene and skin antiseptic. A timeout was performed prior to the initiation of the procedure. In the prone position, the right gluteal region was prepped and draped in a sterile fashion. Under CT guidance, an 18 gauge needle was advanced into the pelvic abscess and removed over an Amplatz wire. Ten [place] dilator followed by a 10 [place] drain were inserted in the fluid collection. Pus was aspirated. The patient was placed supine. The lower abdomen was prepped and draped in a sterile fashion. 1% lidocaine was utilized for local anesthesia. Under CT guidance, an 18 gauge needle was inserted into the upper  pelvic fluid collection and removed over an Amplatz wire. Ten [place] dilator followed by 10 [place] drain were inserted. Cloudy yellow fluid was aspirated. FINDINGS: Images demonstrate 11 French drain placement into the 2 fluid collections described above. IMPRESSION: Successful pelvic abscess drain x2 Electronically Signed   By: Marybelle Killings M.D.   On: 04/16/2018 16:01     CBC Recent Labs  Lab 04/17/18 0443 04/18/18 0309 04/19/18 0419 04/20/18 0504 04/21/18 0406  WBC 7.4 8.5 7.2 7.4 7.8  HGB 8.7* 8.8* 9.0* 8.6* 8.7*  HCT 27.3* 27.7* 27.1* 26.2* 26.1*  PLT 188 202  239 292 334  MCV 90.1 89.9 89.1 89.4 88.5  MCH 28.7 28.6 29.6 29.4 29.5  MCHC 31.9 31.8 33.2 32.8 33.3  RDW 16.5* 16.1* 16.4* 16.0* 16.2*    Chemistries  Recent Labs  Lab 04/15/18 0859 04/17/18 0443 04/18/18 0309 04/19/18 0419 04/21/18 0406  NA 139 137 138 138 138  K 3.6 3.1* 3.1* 3.5 3.3*  CL 104 100 99 100 97*  CO2 26 28 28 30 31   GLUCOSE 103* 78 106* 96 91  BUN 22 21 15 20 20   CREATININE 0.83 1.08* 1.09* 1.07* 1.31*  CALCIUM 8.3* 7.5* 7.3* 7.4* 7.6*  MG  --   --  0.8* 1.5*  --   AST 69*  --   --   --   --   ALT 43  --   --   --   --   ALKPHOS 63  --   --   --   --   BILITOT 1.0  --   --   --   --    ------------------------------------------------------------------------------------------------------------------ No results for input(s): CHOL, HDL, LDLCALC, TRIG, CHOLHDL, LDLDIRECT in the last 72 hours.  Lab Results  Component Value Date   HGBA1C 5.9 (H) 04/07/2018   ------------------------------------------------------------------------------------------------------------------ No results for input(s): TSH, T4TOTAL, T3FREE, THYROIDAB in the last 72 hours.  Invalid input(s): FREET3 ------------------------------------------------------------------------------------------------------------------ No results for input(s): VITAMINB12, FOLATE, FERRITIN, TIBC, IRON, RETICCTPCT in the last 72  hours.  Coagulation profile No results for input(s): INR, PROTIME in the last 168 hours.  No results for input(s): DDIMER in the last 72 hours.  Cardiac Enzymes No results for input(s): CKMB, TROPONINI, MYOGLOBIN in the last 168 hours.  Invalid input(s): CK ------------------------------------------------------------------------------------------------------------------    Component Value Date/Time   BNP 34.0 06/14/2014 1415    Gearlene Godsil MD [facility] pgr [phone] 11/03/2017, 9:25 AM  [facility] - Office  [phone]

## 2018-04-21 NOTE — Progress Notes (Signed)
[facility] Progress Note     Subjective: CC-  Lying in bed, husband at bedside. Main complaint is back pain, worse with movement and sitting in the chair. Abdominal pain still there, but improved from last week. Pain is mostly LLQ, intermittent and sharp at times. Unsure if PO intake makes pain worse. She is not drinking much of anything because she does not have an appetite. Tired of sweet things. WBC 7.8, afebrile.  Objective: Vital signs in last 24 hours: Temp:  [98.6 F (37 C)-99 F (37.2 C)] 98.6 F (37 C) (11/11 0451) Pulse Rate:  [85-91] 85 (11/11 0451) Resp:  [15-18] 15 (11/11 0451) BP: (124-133)/(46-57) 124/57 (11/11 0451) SpO2:  [89 %-97 %] 97 % (11/11 0452) Last BM Date: 04/20/18  Intake/Output from previous day: 11/10 0701 - 11/11 0700 In: 683.4 [I.V.:405; IV Piggyback:248.4] Out: 18 [Drains:18] Intake/Output this shift: Total I/O In: -  Out: 700 [Urine:700]  PE: Gen:  Alert, NAD HEENT: EOM's intact, pupils equal and round Pulm:  effort normal Abd: obese, soft, +BS, no HSM, drain x2, mild TTP RLQ without rebound or guarding - Drain 1: 10cc/24hr yellow/purulent drainage in bag - Drain 2: 8cc/24hr yellow/purulent drainage in bag  Psych: A&Ox3 Skin: no rashes noted, warm and dry  Lab Results:  Recent Labs    04/20/18 0504 04/21/18 0406  WBC 7.4 7.8  HGB 8.6* 8.7*  HCT 26.2* 26.1*  PLT 292 334   BMET Recent Labs    04/19/18 0419 04/21/18 0406  NA 138 138  K 3.5 3.3*  CL 100 97*  CO2 30 31  GLUCOSE 96 91  BUN 20 20  CREATININE 1.07* 1.31*  CALCIUM 7.4* 7.6*   PT/INR No results for input(s): LABPROT, INR in the last 72 hours. CMP     Component Value Date/Time   NA 138 04/21/2018 0406   K 3.3 (L) 04/21/2018 0406   CL 97 (L) 04/21/2018 0406   CO2 31 04/21/2018 0406   GLUCOSE 91 04/21/2018 0406   BUN 20 04/21/2018 0406   CREATININE 1.31 (H) 04/21/2018 0406   CALCIUM 7.6 (L) 04/21/2018 0406   PROT 5.6 (L) 04/15/2018 0859    ALBUMIN 2.1 (L) 04/15/2018 0859   AST 69 (H) 04/15/2018 0859   ALT 43 04/15/2018 0859   ALKPHOS 63 04/15/2018 0859   BILITOT 1.0 04/15/2018 0859   GFRNONAA 39 (L) 04/21/2018 0406   GFRAA 46 (L) 04/21/2018 0406   Lipase  No results found for: LIPASE     Studies/Results: No results found.  Anti-infectives: Anti-infectives (From admission, onward)   Start     Dose/Rate Route Frequency Ordered Stop   04/15/18 1700  meropenem (MERREM) 1 g in sodium chloride 0.9 % 100 mL IVPB     1 g 200 mL/hr over 30 Minutes Intravenous Every 8 hours 04/15/18 1534     04/09/18 2200  meropenem (MERREM) 1 g in sodium chloride 0.9 % 100 mL IVPB  Status:  Discontinued     1 g 200 mL/hr over 30 Minutes Intravenous Every 8 hours 04/09/18 1244 04/14/18 0944   04/06/18 1300  meropenem (MERREM) 1 g in sodium chloride 0.9 % 100 mL IVPB  Status:  Discontinued     1 g 200 mL/hr over 30 Minutes Intravenous Every 12 hours 04/06/18 1145 04/09/18 1244   04/06/18 1145  meropenem (MERREM) 1 g in sodium chloride 0.9 % 100 mL IVPB  Status:  Discontinued     1 g 200 mL/hr over  30 Minutes Intravenous Every 8 hours 04/06/18 1139 04/06/18 1145   04/06/18 0900  ciprofloxacin (CIPRO) IVPB 400 mg  Status:  Discontinued     400 mg 200 mL/hr over 60 Minutes Intravenous Every 12 hours 04/06/18 0732 04/06/18 1139   04/06/18 0300  metroNIDAZOLE (FLAGYL) IVPB 500 mg  Status:  Discontinued     500 mg 100 mL/hr over 60 Minutes Intravenous Every 8 hours 04/05/18 2335 04/06/18 1139   04/05/18 1930  ciprofloxacin (CIPRO) IVPB 400 mg     400 mg 200 mL/hr over 60 Minutes Intravenous  Once 04/05/18 1925 04/05/18 2217   04/05/18 1930  metroNIDAZOLE (FLAGYL) IVPB 500 mg     500 mg 100 mL/hr over 60 Minutes Intravenous  Once 04/05/18 1925 04/05/18 2102       Assessment/Plan HTN HLD 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 - on plavix (last dose 10/27) H/o CVA  Morbidly obese Chronic pain RUE DVT - on IV heparin  Acute  perforated sigmoid diverticulitis with multiple abscesses - CT scan 11/5 revealsat least 3 fluid collectionsin the lower abdomen/pelvis with previous perforated diverticulitis; fluid collections are7.3 x 5.8 cm in left lower quadrant, air-fluid collection measuring 8.2 x 5.9 cm in the right lower quadrant, and8.9 x 5.3 cm fluid collection in the pelvis in the pre rectal space which appears to contain stool and fluid - s/pIR perc drains x 2 - 04/16/18, culture with multiple organisms present/none predominant  ID -currently merrem 10/27>> VTE -SCDs, IV heparin FEN -IVF, bariatric clears (less sugar)  Plan: WBC has normalized and patient is afebrile, but she is TTP RLQ, no peritonitis. She is not eating because she does not have an appetite. Drain output now minimal. Will discuss repeating scan with MD.   LOS: 14 days    Wellington Hampshire , [facility] 04/21/2018, 9:33 AM Pager: [phone] Mon 7:00 am -11:30 AM Tues-Fri 7:00 am-4:30 pm Sat-Sun 7:00 am-11:30 am

## 2018-04-21 NOTE — Progress Notes (Signed)
Pharmacy Antibiotic Note  [name] is a 72 y.o. female admitted on 04/05/2018 with Sepsis with pneumoperitoneum secondary to worsening perforated sigmoid diverticulitis with multiple abdominal fluid collections and multiple abscesses.  Pharmacy dosing Merrem dosing.  Tmax 99, afebrile.  WBC 7.8 WNL. Scr 1.09>1.07>1.31 CrCl ~ 50 ml/min. Sepsis with pneumoperitoneum secondary to worsening perforated sigmoid diverticulitis with multiple abdominal fluid collections and multiple abscesses.2 drains placed 11/6.-resumed Merrem.  SCr increased today,  If continues to rise will need to adjust Merrem dose.   Cipro 10/26 >> 10/27 Flagyl 10/26 >> 10/27 Merrem 10/27>>11/4,  11/5 >>  10/26 BCx: negative 11/6: Pelvic abscess >>ngtd x4 days 11/6: Pelvic abscess 2: multiple organisms>>none predominant 10/28 MRSA PCR: neg   Plan: Continue Merrem 1g IV q 8 hrs SCr x1 in AM, if continues to rise will need adust merrem to q12h   Height: 5' 5.5" (166.4 cm) Weight: 271 lb 2.7 oz (123 kg) IBW/kg (Calculated) : 58.15  Temp (24hrs), Avg:98.9 F (37.2 C), Min:98.6 F (37 C), Max:99 F (37.2 C)  Recent Labs  Lab 04/15/18 0859  04/17/18 0443 04/18/18 0309 04/19/18 0419 04/20/18 0504 04/21/18 0406  WBC 8.9   < > 7.4 8.5 7.2 7.4 7.8  CREATININE 0.83  --  1.08* 1.09* 1.07*  --  1.31*   < > = values in this interval not displayed.    Estimated Creatinine Clearance: 50.8 mL/min (A) (by C-G formula based on SCr of 1.31 mg/dL (H)).    Allergies  Allergen Reactions  . Fentanyl Anaphylaxis and Shortness Of Breath  . Lactose Intolerance (Gi) Other (See Comments)    G.I. Upset  . Butrans [Buprenorphine] Rash and Other (See Comments)    Infected skin underneath application  . Penicillins Rash    Facial rash Has patient had a PCN reaction causing immediate rash, facial/tongue/throat swelling, SOB or lightheadedness with hypotension: Yes Has patient had a PCN reaction causing severe rash  involving mucus membranes or skin necrosis: No Has patient had a PCN reaction that required hospitalization No Has patient had a PCN reaction occurring within the last 10 years: Yes If all of the above answers are "NO", then may proceed with Cephalosporin use.   . Simvastatin Rash    Nicole Cella, RPh Clinical Pharmacist Please check AMION for all [place] phone numbers After 10:00 PM, call [place] [phone] 04/21/2018 11:10 AM

## 2018-04-22 ENCOUNTER — Inpatient Hospital Stay (HOSPITAL_COMMUNITY): Payer: Medicare Other

## 2018-04-22 LAB — AEROBIC/ANAEROBIC CULTURE (SURGICAL/DEEP WOUND)

## 2018-04-22 LAB — GLUCOSE, CAPILLARY
GLUCOSE-CAPILLARY: 106 mg/dL — AB (ref 70–99)
GLUCOSE-CAPILLARY: 99 mg/dL (ref 70–99)
Glucose-Capillary: 96 mg/dL (ref 70–99)
Glucose-Capillary: 105 mg/dL — ABNORMAL HIGH (ref 70–99)
Glucose-Capillary: 111 mg/dL — ABNORMAL HIGH (ref 70–99)
Glucose-Capillary: 93 mg/dL (ref 70–99)

## 2018-04-22 LAB — AEROBIC/ANAEROBIC CULTURE W GRAM STAIN (SURGICAL/DEEP WOUND)

## 2018-04-22 LAB — BASIC METABOLIC PANEL
Anion gap: 8 (ref 5–15)
BUN: 17 mg/dL (ref 8–23)
CHLORIDE: 97 mmol/L — AB (ref 98–111)
CO2: 34 mmol/L — ABNORMAL HIGH (ref 22–32)
Calcium: 7.7 mg/dL — ABNORMAL LOW (ref 8.9–10.3)
Creatinine, Ser: 1.02 mg/dL — ABNORMAL HIGH (ref 0.44–1.00)
GFR calc Af Amer: >60 mL/min (ref >60)
GFR calc non Af Amer: 53 mL/min — ABNORMAL LOW (ref >60)
Glucose, Bld: 109 mg/dL — ABNORMAL HIGH (ref 70–99)
POTASSIUM: 3.3 mmol/L — AB (ref 3.5–5.1)
SODIUM: 139 mmol/L (ref 135–145)

## 2018-04-22 LAB — HEPARIN LEVEL (UNFRACTIONATED): Heparin Unfractionated: 0.69 IU/mL (ref 0.30–0.70)

## 2018-04-22 LAB — CBC
HEMATOCRIT: 26.9 % — AB (ref 36.0–46.0)
HEMOGLOBIN: 9 g/dL — AB (ref 12.0–15.0)
MCH: 29.9 pg (ref 26.0–34.0)
MCHC: 33.5 g/dL (ref 30.0–36.0)
MCV: 89.4 fL (ref 80.0–100.0)
NRBC: 0.6 % — AB (ref 0.0–0.2)
Platelets: 358 10*3/uL (ref 150–400)
RBC: 3.01 MIL/uL — AB (ref 3.87–5.11)
RDW: 16.3 % — ABNORMAL HIGH (ref 11.5–15.5)
WBC: 7.7 10*3/uL (ref 4.0–10.5)

## 2018-04-22 LAB — PHOSPHORUS: Phosphorus: 3.4 mg/dL (ref 2.5–4.6)

## 2018-04-22 MED ORDER — METHOCARBAMOL 750 MG PO TABS
750.0000 mg | ORAL_TABLET | Freq: Three times a day (TID) | ORAL | Status: DC | PRN
  Administered 2018-04-24 – 2018-04-28 (×4): 750 mg via ORAL
  Filled 2018-04-22 (×4): qty 1

## 2018-04-22 MED ORDER — POTASSIUM CHLORIDE CRYS ER 20 MEQ PO TBCR
40.0000 meq | EXTENDED_RELEASE_TABLET | ORAL | Status: AC
  Administered 2018-04-22 (×2): 40 meq via ORAL
  Filled 2018-04-22 (×2): qty 2

## 2018-04-22 MED ORDER — METOPROLOL TARTRATE 25 MG PO TABS
37.5000 mg | ORAL_TABLET | Freq: Two times a day (BID) | ORAL | Status: DC
  Administered 2018-04-22 – 2018-04-30 (×16): 37.5 mg via ORAL
  Filled 2018-04-22 (×16): qty 1

## 2018-04-22 MED ORDER — SODIUM CHLORIDE 0.9 % IV SOLN
8.0000 mg | Freq: Once | INTRAVENOUS | Status: DC
  Filled 2018-04-22: qty 4

## 2018-04-22 NOTE — Progress Notes (Signed)
Patient Demographics:    [name], is a 72 y.o. female, DOB - [birth year], TWS:[id number]  Admit date - 04/05/2018   Admitting Physician Melody Gravel, MD  Outpatient Primary MD for the patient is Melody Squibb, MD  LOS - 15   Chief Complaint  Patient presents with  . Abdominal Pain      Brief narrative:  72yo female PMH HTN, DM-II, CKD, RA, H/o CAD s/p CABG (on plavix,), who was admitted to [facility] 10/26 with acute sigmoid diverticulitis without perforation or abscess. Initially felt to be improving on IV merrem, but she continued to have abdominal pain, CT abdomen pelvis was obtained 04/15/2018, was significant for worsening diverticulitis now with multiple abdominal fluid collections.  Patient developed acute right upper extremity DVT in the setting of PICC line, she was transferred from [facility] to [facility] 04/15/2018 for evaluation by IR.  She has drain placed by IR 04/16/2018   Subjective:    [name] denies any fever or chills overnight, no further diarrhea, reports abdominal pain at baseline, reports lower back pain, patient with an episode of PAT overnight   Assessment  & P lan :    Principal Problem:   Diverticulitis Active Problems:   DM (diabetes mellitus) ([place])   HTN (hypertension)   Rheumatoid arthritis ([place])   Anemia   Renal insufficiency   Sepsis due to undetermined organism ([place])   CKD (chronic kidney disease) stage 3, GFR 30-59 ml/min ([facility])   Fibromyalgia   Diverticulitis large intestine w/o perforation or abscess w/o bleeding   Acute diverticulitis   Perforated sigmoid colon ([place])   Acute renal failure superimposed on stage 3 chronic kidney disease ([place])   Sepsis due to acute perforated sigmoid diverticulitis- -sepsis criteria met on admission - imaging 04/06/2018 on admission significant for perforated sigmoid diverticulitis, was managed conservatively on IV meropenem,  clear liquid diet, . -Repeat imaging 04/15/2018 with large abscesses in the abdomen, transferred to [facility] for further care, general surgery consult greatly appreciated. -IR consulted, they have placed to drainage in her mesenteric abscess, pelvic abscess, cultures were growing multiple organisms, none predominant, no anaerobes isolated, continue with meropenem. -Repeat CT abdomen pelvis today showing large pelvis abscess, as well 1 of the mesenteric abscesses has resolved, though communicating with another still present abscess , and still present large right abdomen gas area, further recommendation surgery, awaiting their recommendation .  Acute right upper extremity DVT -In the setting of PICC line insertion, start on anticoagulation, now continue with heparin GTT for anticoagulation, we will not initiate any oral anticoagulation for possible need for surgical intervention, or further procedures. -PICC line  discontinued 04/16/2018  acute kidney injury on CKD3. Baseline creat 1.2.   - AKI resolved w/ IVF but creat down to 0.7 now, appears to be volume overloaded, continue with diuresis as blood pressure tolerates.  Fibromyalgia -Discussed with the patient, and on her home dose Lyrica  Hypertension -Continue with metoprolol, given soft blood pressure continue to hold home medication including as  isosorbide/ olmesartan/amlodipine/HCTZ combo -Patient's blood pressure has improved, within normal limit, so no need to resume any antihypertensive meds right now, as I will continue with IV diuresis, as so far she is +20 L during this hospital  stay.   DM2 -   BS's are good, cont Novolog/Humalog Sliding scale insulin  H/o CAD sp CABG /hx TIA - previously on Plavix and pravastatin, continue to hold Plavix may need further procedures or surgery  Debility - needs to get OOB , PT seeing patient , SNF recommended, SW consulted   acute on chronic anemia--- sp 2 u prbc's,   Started on Lexapro for  depression  Had an episode of PAT/SVT overnight her heart rate went into the 160s, resolved without intervention, she is already on beta-blockers, I will decrease the dose, I will correct her hypokalemia but she had recent echo with a preserved EF.   Code Status : full  Family communication: Husband at bedside  Disposition Plan  : TBD, will prob need SNF  Consults  :  Gen Surgery, IR  DVT Prophylaxis  :   SCDs, heparin GTT   Lab Results  Component Value Date   PLT 358 04/22/2018    Inpatient Medications  Scheduled Meds: . Chlorhexidine Gluconate Cloth  6 each Topical Daily  . escitalopram  20 mg Oral QHS  . feeding supplement (ENSURE ENLIVE)  237 mL Oral TID BM  . feeding supplement (GLUCERNA SHAKE)  237 mL Oral TID BM  . furosemide  40 mg Intravenous Daily  . latanoprost  1 drop Both Eyes QHS  . metoprolol tartrate  25 mg Oral BID  . nystatin   Topical TID  . pantoprazole  40 mg Oral Daily  . potassium chloride  40 mEq Oral Q4H  . pravastatin  40 mg Oral QHS  . pregabalin  50 mg Oral QHS  . sodium chloride flush  10-40 mL Intracatheter Q12H  . sodium chloride flush  5 mL Intracatheter Q8H  . temazepam  30 mg Oral Once  . timolol  1 drop Both Eyes BID   Continuous Infusions: . heparin 1,500 Units/hr (04/22/18 0222)  . meropenem (MERREM) IV 1 g (04/22/18 1014)  . ondansetron (ZOFRAN) IV     PRN Meds:.acetaminophen **OR** acetaminophen, albuterol, hydrALAZINE, HYDROcodone-acetaminophen, methocarbamol, ondansetron (ZOFRAN) IV, oxyCODONE, senna-docusate, sodium chloride flush, traMADol    Anti-infectives (From admission, onward)   Start     Dose/Rate Route Frequency Ordered Stop   04/15/18 1700  meropenem (MERREM) 1 g in sodium chloride 0.9 % 100 mL IVPB     1 g 200 mL/hr over 30 Minutes Intravenous Every 8 hours 04/15/18 1534     04/09/18 2200  meropenem (MERREM) 1 g in sodium chloride 0.9 % 100 mL IVPB  Status:  Discontinued     1 g 200 mL/hr over 30 Minutes  Intravenous Every 8 hours 04/09/18 1244 04/14/18 0944   04/06/18 1300  meropenem (MERREM) 1 g in sodium chloride 0.9 % 100 mL IVPB  Status:  Discontinued     1 g 200 mL/hr over 30 Minutes Intravenous Every 12 hours 04/06/18 1145 04/09/18 1244   04/06/18 1145  meropenem (MERREM) 1 g in sodium chloride 0.9 % 100 mL IVPB  Status:  Discontinued     1 g 200 mL/hr over 30 Minutes Intravenous Every 8 hours 04/06/18 1139 04/06/18 1145   04/06/18 0900  ciprofloxacin (CIPRO) IVPB 400 mg  Status:  Discontinued     400 mg 200 mL/hr over 60 Minutes Intravenous Every 12 hours 04/06/18 0732 04/06/18 1139   04/06/18 0300  metroNIDAZOLE (FLAGYL) IVPB 500 mg  Status:  Discontinued     500 mg 100 mL/hr over 60 Minutes Intravenous Every 8  hours 04/05/18 2335 04/06/18 1139   04/05/18 1930  ciprofloxacin (CIPRO) IVPB 400 mg     400 mg 200 mL/hr over 60 Minutes Intravenous  Once 04/05/18 1925 04/05/18 2217   04/05/18 1930  metroNIDAZOLE (FLAGYL) IVPB 500 mg     500 mg 100 mL/hr over 60 Minutes Intravenous  Once 04/05/18 1925 04/05/18 2102        Objective:   Vitals:   04/21/18 2216 04/22/18 0415 04/22/18 0953 04/22/18 1353  BP: 96/77 135/63 140/77 136/67  Pulse: (!) 162 92 87 85  Resp:    20  Temp: 98.4 F (36.9 C) 98 F (36.7 C)  99 F (37.2 C)  TempSrc: Oral Oral  Oral  SpO2: 97% 97%  98%  Weight:      Height:        Wt Readings from Last 3 Encounters:  04/19/18 123 kg  02/13/18 116.1 kg  01/13/18 120.7 kg     Intake/Output Summary (Last 24 hours) at 04/22/2018 1416 Last data filed at 04/22/2018 1100 Gross per 24 hour  Intake 300 ml  Output 700 ml  Net -400 ml     Physical Exam  Awake Alert, Oriented X 3, No new F.N deficits, Normal affect Symmetrical Chest wall movement, Good air movement bilaterally, CTAB RRR,No Gallops,Rubs or new Murmurs, No Parasternal Heave +ve B.Sounds, Abd Soft, right abdomen tenderness, patient has percutaneous drain x2 no tenderness, No rebound -  guarding or rigidity. No Cyanosis, Clubbing or edema, No new Rash or bruise        Data Review:   Micro Results Recent Results (from the past 240 hour(s))  Aerobic/Anaerobic Culture (surgical/deep wound)     Status: Abnormal   Collection Time: 04/16/18  3:13 PM  Result Value Ref Range Status   Specimen Description PELVIS ABSCESS  Final   Special Requests NONE  Final   Gram Stain   Final    FEW WBC PRESENT,BOTH PMN AND MONONUCLEAR FEW GRAM POSITIVE COCCI IN CLUSTERS FEW GRAM POSITIVE RODS    Culture (A)  Final    MULTIPLE ORGANISMS PRESENT, NONE PREDOMINANT NO ANAEROBES ISOLATED Performed at [facility], [place] [address] [place], [place] [address]    Report Status 04/22/2018 FINAL  Final  Aerobic/Anaerobic Culture (surgical/deep wound)     Status: None (Preliminary result)   Collection Time: 04/16/18  3:13 PM  Result Value Ref Range Status   Specimen Description ABSCESS PELVIS  Final   Special Requests SAMPLE NO 2  Final   Gram Stain   Final    FEW WBC PRESENT,BOTH PMN AND MONONUCLEAR NO ORGANISMS SEEN    Culture   Final    CULTURE REINCUBATED FOR BETTER GROWTH HOLDING FOR POSSIBLE ANAEROBE Performed at [facility], [address] [place], [place] [address]    Report Status PENDING  Incomplete    Radiology Reports Ct Abdomen Pelvis Wo Contrast  Result Date: 04/22/2018 CLINICAL DATA:  Follow-up diverticulitis EXAM: CT ABDOMEN AND PELVIS WITHOUT CONTRAST TECHNIQUE: Multidetector CT imaging of the abdomen and pelvis was performed following the standard protocol without IV contrast. Sagittal and coronal MPR images reconstructed from axial data set. Patient drank dilute oral contrast for exam COMPARISON:  04/15/2018 FINDINGS: Lower chest: Bibasilar atelectasis and tiny pleural effusions. Calcified granulomata at lung bases. Hepatobiliary: Gallbladder surgically absent. No focal hepatic abnormalities. Pancreas: Normal appearance Spleen: Normal appearance  Adrenals/Urinary Tract: Adrenal glands normal appearance. Tiny nonobstructing calculus at inferior pole LEFT kidney. Kidneys, ureters, and  bladder otherwise normal appearance. Stomach/Bowel: Stomach under distended with suboptimal assessment of wall thickness. Diffuse diverticulosis of the transverse, descending, and sigmoid colon. Persistent wall thickening of the sigmoid colon. Wall thickening also identified at the cecum extending to the ileocecal valve, slightly increased since previous study. No evidence of bowel obstruction. Small bowel loops unremarkable. Vascular/Lymphatic: Atherosclerotic calcifications aorta and iliac arteries. No adenopathy Reproductive: Uterus unremarkable.  Ovaries poorly visualized. Other: Pigtail drainage catheter in pelvis with minimal residual fluid at the previously identified large pelvic abscess which was drained. Pigtail drainage catheter within the mesentery in the mid abdomen with minimal residual fluid within the previously seen abscess collection. This residual fluid extends to a second persistent collection of fluid within the mesentery adjacent to a small bowel loop, consistent with residual abscess, 4.4 x 3.5 cm previously 5.1 x 3.6 cm. Additional large gas and minimal fluid containing collection in the RIGHT pelvis medial and inferior to the cecum measures 8.2 x 5.0 x 6.8 cm previously 8.2 x 5.9 x 8.0 cm. No new abscess collections. No free intraperitoneal air. Stranding of tissue planes in the pelvis again identified with minimal fluid at the LEFT pericolic gutter and perisplenic. Musculoskeletal: No acute osseous findings. IMPRESSION: Interval resolution of large pelvic abscess collection post drainage. Minimal residual mesenteric abscess collection post drainage though the minimal residual fluid appears to communicate with a second collection in the mesentery in the LEFT mid abdomen adjacent to a small bowel loop measuring 4.4 x 3.5 cm slightly decreased since  previous exam. Persistent large extraluminal gas collection in the RIGHT pelvis 8.2 x 5.0 x 6.8 cm, only slightly decreased. Diffuse colonic diverticulosis with persistent wall thickening of the sigmoid colon. Electronically Signed   By: Lavonia Dana M.D.   On: 04/22/2018 13:05   Ct Abdomen Pelvis Wo Contrast  Result Date: 04/15/2018 CLINICAL DATA:  Acute lower abdominal pain. EXAM: CT ABDOMEN AND PELVIS WITHOUT CONTRAST TECHNIQUE: Multidetector CT imaging of the abdomen and pelvis was performed following the standard protocol without IV contrast. COMPARISON:  CT scan of April 06, 2018. FINDINGS: Lower chest: No acute abnormality. Hepatobiliary: No focal liver abnormality is seen. Status post cholecystectomy. No biliary dilatation. Pancreas: Unremarkable. No pancreatic ductal dilatation or surrounding inflammatory changes. Spleen: Normal in size without focal abnormality. Adrenals/Urinary Tract: Adrenal glands appear normal. Small nonobstructive right renal calculus is noted. No hydronephrosis or renal obstruction is noted. Urinary bladder is unremarkable. Stomach/Bowel: The stomach and appendix are unremarkable. Sigmoid diverticulosis is noted. Some degree of residual diverticulitis cannot be excluded. However, 7.3 x 5.8 cm fluid collection is noted in left lower quadrant, which communicates with smaller fluid collection measuring 5.1 x 3.6 cm. This is concerning for abscess status post previous perforated diverticulitis. Air-fluid collection measuring 8.2 x 5.9 cm is seen in the right lower quadrant also concerning for possible abscess. 8.9 x 5.3 cm fluid and stool collection is seen posterior to the uterus and anterior to the rectum concerning for abscess or contained perforation. Vascular/Lymphatic: Aortic atherosclerosis. No enlarged abdominal or pelvic lymph nodes. Reproductive: Uterus and bilateral adnexa are unremarkable. Other: Stable 6.9 x 4.5 cm fluid collection in left inguinal region. This most  likely is benign. Musculoskeletal: No acute or significant osseous findings. IMPRESSION: There are at least 3 fluid collections and probable abscesses seen in the lower abdomen and pelvis, following previously perforated diverticulitis. 7.3 x 5.8 cm fluid collection is noted in left lower quadrant, as well as air-fluid collection measuring 8.2 x 5.9 cm  seen in the right lower quadrant. Also noted is 8.9 x 5.3 cm fluid collection in the pelvis in the pre rectal space which appears to contain stool and fluid. These results will be called to the ordering clinician or representative by the Radiologist Assistant, and communication documented in the PACS or zVision Dashboard. Small nonobstructive right renal calculus. Aortic Atherosclerosis (ICD10-I70.0). Electronically Signed   By: Marijo Conception, M.D.   On: 04/15/2018 14:10   Ct Abdomen Pelvis Wo Contrast  Result Date: 04/06/2018 CLINICAL DATA:  72 year old female with concern for perforated diverticulitis. EXAM: CT ABDOMEN AND PELVIS WITHOUT CONTRAST TECHNIQUE: Multidetector CT imaging of the abdomen and pelvis was performed following the standard protocol without IV contrast. COMPARISON:  Chest radiograph dated 04/06/2018 and CT dated 04/05/2018 FINDINGS: Evaluation of this exam is limited in the absence of intravenous contrast. Lower chest: Diffuse interstitial coarsening and streaky atelectasis/scarring of the lung bases. There is mild cardiomegaly. There is hypoattenuation of the cardiac blood pool suggestive of a degree of anemia. Clinical correlation is recommended. There is pneumoperitoneum and small amount of free fluid within the pelvis. Hepatobiliary: Advanced fatty infiltration of the liver. No intrahepatic biliary ductal dilatation. Cholecystectomy. Pancreas: Unremarkable. No pancreatic ductal dilatation or surrounding inflammatory changes. Spleen: Normal in size without focal abnormality. Adrenals/Urinary Tract: The adrenal glands are unremarkable.  There is no hydronephrosis on either side. Excreted contrast in the renal collecting systems bilaterally. The visualized ureters appear unremarkable. There is trabeculated appearance of the bladder wall likely related to chronic bladder dysfunction. Correlation with urinalysis recommended to exclude cystitis. Stomach/Bowel: There is extensive sigmoid diverticulosis. There is associated inflammatory changes and perforation of the sigmoid colon. There is increased size of the extraluminal air since the prior CT. No drainable fluid collection is noted at this time. There is no bowel obstruction. Normal appendix. Vascular/Lymphatic: There is advanced aortoiliac atherosclerotic disease. No portal venous gas. There is no adenopathy. Reproductive: The uterus is grossly unremarkable. Other: None Musculoskeletal: Degenerative changes of the spine. No acute osseous pathology. IMPRESSION: 1. Perforated sigmoid diverticulitis with increased size of the extraluminal air since the prior CT. No drainable fluid collection noted at this time. 2. No bowel obstruction. Normal appendix. 3. Advanced fatty infiltration of the liver. These results were called by telephone at the time of interpretation on 04/06/2018 at 11:09 pm to nurse Hearn, who verbally acknowledged these results. Electronically Signed   By: Anner Crete M.D.   On: 04/06/2018 23:25   Dg Chest 2 View  Result Date: 04/06/2018 CLINICAL DATA:  Dyspnea, sepsis, pt listless and unable to cooperate EXAM: CHEST - 2 VIEW COMPARISON:  06/27/2016 and 06/18/2016. FINDINGS: On the lateral view, there is evidence of a small amount free intraperitoneal air under the left hemidiaphragm. Lung volumes are low. There is opacity at the bases that is likely due to atelectasis along with bronchovascular crowding. No convincing pneumonia and no evidence of pulmonary edema. 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 Cardiac silhouette is normal in size. No mediastinal hilar masses. No  pleural effusion.  No pneumothorax. Skeletal structures are grossly intact. IMPRESSION: 1. Small amount of free intraperitoneal air. Recommend follow-up abdomen and pelvis CT with contrast for further assessment. 2. Somewhat limited study due to low lung volumes. Allowing for this, no acute cardiopulmonary disease. Electronically Signed   By: Lajean Manes M.D.   On: 04/06/2018 11:23   Ct Abdomen Pelvis W Contrast  Result Date: 04/05/2018 CLINICAL DATA:  Patient has bilateral lower abdominal sharp  intermittent pain with nausea without vomiting. H/x diverticulitis patient states pain and symptoms similar to past. EXAM: CT ABDOMEN AND PELVIS WITH CONTRAST TECHNIQUE: Multidetector CT imaging of the abdomen and pelvis was performed using the standard protocol following bolus administration of intravenous contrast. CONTRAST:  40m ISOVUE-300 IOPAMIDOL (ISOVUE-300) INJECTION 61% COMPARISON:  02/06/2018 FINDINGS: Lower chest: There is scarring in both lung bases. Coronary artery calcifications are present. The heart is normal in size. Hepatobiliary: Liver is diffusely low attenuation. No focal liver lesions. Cholecystectomy. Pancreas: Unremarkable. No pancreatic ductal dilatation or surrounding inflammatory changes. Spleen: Normal in size without focal abnormality. Adrenals/Urinary Tract: Adrenal glands are normal. There is symmetric enhancement and excretion from the kidneys. 1 millimeter intrarenal calcification identified in the LOWER pole of the RIGHT kidney, not associated with obstruction. Ureters are unremarkable. The bladder and visualized portion of the urethra are normal. Stomach/Bowel: The stomach is normal in appearance. Small bowel loops are normal in caliber and wall thickness. The appendix is well seen and has a normal appearance. There is significant inflammatory change in the region of sigmoid colon and associated numerous diverticula in this segment. Fluid extends in the sigmoid mesentery. The  diseased segment of sigmoid is adjacent to the uterus and the urinary bladder. No evidence for abscess or perforation. Vascular/Lymphatic: There is atherosclerotic calcification of the abdominal aorta. There is normal vascular opacification of the celiac axis, superior mesenteric artery, and inferior mesenteric artery. Normal appearance of the portal venous system and inferior vena cava. Reproductive: The uterus is present and is surrounded by inflammatory changes from the sigmoid diverticulitis. No adnexal mass. Other: Trace amount of free pelvic fluid. Musculoskeletal: Incompletely imaged fluid attenuation collection within the LEFT hip abductors, measuring at least 7.3 x 4.8 centimeters. The appearance is stable since 2017. There are degenerative changes in the lumbar spine with 7 millimeters anterolisthesis of L4 on L5. Median sternotomy. IMPRESSION: 1. Acute sigmoid diverticulitis with significant fluid in the sigmoid mesentery. There is no evidence for perforation or abscess. 2. Coronary artery calcifications. 3. Hepatic steatosis.  Cholecystectomy. 4. Nonobstructing intrarenal calculus in the LOWER pole the RIGHT kidney. 5. Normal appendix. 6.  Aortic atherosclerosis. 7. Probably benign ganglion cyst within the LEFT hip abductors. Electronically Signed   By: ENolon NationsM.D.   On: 04/05/2018 22:06   UKoreaVenous Img Upper Uni Right  Result Date: 04/15/2018 CLINICAL DATA:  None EXAM: RIGHT UPPER EXTREMITY VENOUS DOPPLER ULTRASOUND TECHNIQUE: Gray-scale sonography with graded compression, as well as color Doppler and duplex ultrasound were performed to evaluate the upper extremity deep venous system from the level of the subclavian vein and including the jugular, axillary, basilic, radial, ulnar and upper cephalic vein. Spectral Doppler was utilized to evaluate flow at rest and with distal augmentation maneuvers. COMPARISON:  None. FINDINGS: Contralateral Subclavian Vein: Respiratory phasicity is normal  and symmetric with the symptomatic side. No evidence of thrombus. Normal compressibility. Internal Jugular Vein: No evidence of thrombus. Normal compressibility, respiratory phasicity and response to augmentation. Subclavian Vein: No evidence of thrombus. Normal compressibility, respiratory phasicity and response to augmentation. Axillary Vein: No evidence of thrombus. Normal compressibility, respiratory phasicity and response to augmentation. Cephalic Vein: No evidence of thrombus. Normal compressibility, respiratory phasicity and response to augmentation. Basilic Vein: No evidence of thrombus. Normal compressibility, respiratory phasicity and response to augmentation. Brachial Veins: Occlusive thrombus of the right brachial vein, paired brachial veins. Radial Veins: No evidence of thrombus. Normal compressibility, respiratory phasicity and response to augmentation. Ulnar Veins: No evidence  of thrombus. Normal compressibility, respiratory phasicity and response to augmentation. Other Findings:  Edema IMPRESSION: Sonographic survey of the right upper extremity is positive for occlusive DVT of the paired right brachial vein. Electronically Signed   By: Corrie Mckusick D.O.   On: 04/15/2018 14:17   Dg Chest Port 1 View  Result Date: 04/11/2018 CLINICAL DATA:  PICC placement. EXAM: PORTABLE CHEST 1 VIEW COMPARISON:  Chest radiograph 04/06/2018. FINDINGS: Cardiomegaly.  Mild vascular congestion.  Prior CABG. RIGHT arm PICC line tip appears to lie distal SVC. This is marked with an arrow. No pneumothorax. IMPRESSION: RIGHT arm PICC line tip appears to lie distal SVC. No pneumothorax. Cardiomegaly with mild vascular congestion, stable from priors. Electronically Signed   By: Staci Righter M.D.   On: 04/11/2018 09:34   Ct Image Guided Fluid Drain By Catheter  Result Date: 04/16/2018 INDICATION: Pelvic abscess x2 EXAM: PELVIC ABSCESS DRAIN X2 CT-GUIDED MEDICATIONS: The patient is currently admitted to the hospital and  receiving intravenous antibiotics. The antibiotics were administered within an appropriate time frame prior to the initiation of the procedure. ANESTHESIA/SEDATION: Fentanyl 0 mcg IV; Versed 4 mg IV, 4 mg morphine sulfate Moderate Sedation Time:  53 minutes The patient was continuously monitored during the procedure by the interventional radiology nurse under my direct supervision. COMPLICATIONS: None immediate. PROCEDURE: Informed written consent was obtained from the patient after a thorough discussion of the procedural risks, benefits and alternatives. All questions were addressed. Maximal Sterile Barrier Technique was utilized including caps, mask, sterile gowns, sterile gloves, sterile drape, hand hygiene and skin antiseptic. A timeout was performed prior to the initiation of the procedure. In the prone position, the right gluteal region was prepped and draped in a sterile fashion. Under CT guidance, an 18 gauge needle was advanced into the pelvic abscess and removed over an Amplatz wire. Ten [place] dilator followed by a 10 [place] drain were inserted in the fluid collection. Pus was aspirated. The patient was placed supine. The lower abdomen was prepped and draped in a sterile fashion. 1% lidocaine was utilized for local anesthesia. Under CT guidance, an 18 gauge needle was inserted into the upper pelvic fluid collection and removed over an Amplatz wire. Ten [place] dilator followed by 10 [place] drain were inserted. Cloudy yellow fluid was aspirated. FINDINGS: Images demonstrate 18 French drain placement into the 2 fluid collections described above. IMPRESSION: Successful pelvic abscess drain x2 Electronically Signed   By: Marybelle Killings M.D.   On: 04/16/2018 16:01     CBC Recent Labs  Lab 04/18/18 0309 04/19/18 0419 04/20/18 0504 04/21/18 0406 04/22/18 0359  WBC 8.5 7.2 7.4 7.8 7.7  HGB 8.8* 9.0* 8.6* 8.7* 9.0*  HCT 27.7* 27.1* 26.2* 26.1* 26.9*  PLT 202 239 292 334 358  MCV 89.9 89.1 89.4 88.5  89.4  MCH 28.6 29.6 29.4 29.5 29.9  MCHC 31.8 33.2 32.8 33.3 33.5  RDW 16.1* 16.4* 16.0* 16.2* 16.3*    Chemistries  Recent Labs  Lab 04/17/18 0443 04/18/18 0309 04/19/18 0419 04/21/18 0406 04/22/18 0359  NA 137 138 138 138 139  K 3.1* 3.1* 3.5 3.3* 3.3*  CL 100 99 100 97* 97*  CO2 _0 34*  GLUCOSE 78 106* 96 91 109*  BUN _1 CREATININE 1.08* 1.09* 1.07* 1.31* 1.02*  CALCIUM 7.5* 7.3* 7.4* 7.6* 7.7*  MG  --  0.8* 1.5*  --   --    ------------------------------------------------------------------------------------------------------------------ No results for input(s): CHOL,  HDL, LDLCALC, TRIG, CHOLHDL, LDLDIRECT in the last 72 hours.  Lab Results  Component Value Date   HGBA1C 5.9 (H) 04/07/2018   ------------------------------------------------------------------------------------------------------------------ No results for input(s): TSH, T4TOTAL, T3FREE, THYROIDAB in the last 72 hours.  Invalid input(s): FREET3 ------------------------------------------------------------------------------------------------------------------ No results for input(s): VITAMINB12, FOLATE, FERRITIN, TIBC, IRON, RETICCTPCT in the last 72 hours.  Coagulation profile No results for input(s): INR, PROTIME in the last 168 hours.  No results for input(s): DDIMER in the last 72 hours.  Cardiac Enzymes No results for input(s): CKMB, TROPONINI, MYOGLOBIN in the last 168 hours.  Invalid input(s): CK ------------------------------------------------------------------------------------------------------------------    Component Value Date/Time   BNP 34.0 06/14/2014 1415    Dawood Elgergawy MD [facility] pgr [phone] 11/03/2017, 9:25 AM  [facility] - Office  [phone]

## 2018-04-22 NOTE — Progress Notes (Signed)
Received call from CCMD that patient's HR high and in the 160's. Upon assessment, pt resting and denied any discomfort, but HR erratic and running 140's to 170's. Notified Bodenheimer, NP and obtained EKG. Administered fluid bolus per NP. Reassessment finds pt HR in 90's, still erratic but NSR at this time. Per NP, held metoprolol due to soft BP's. Will continue to monitor.

## 2018-04-22 NOTE — Progress Notes (Signed)
ANTICOAGULATION CONSULT NOTE - Follow Up Consult  Pharmacy Consult for Heparin Indication: DVT  Allergies  Allergen Reactions  . Fentanyl Anaphylaxis and Shortness Of Breath  . Lactose Intolerance (Gi) Other (See Comments)    G.I. Upset  . Butrans [Buprenorphine] Rash and Other (See Comments)    Infected skin underneath application  . Penicillins Rash    Facial rash Has patient had a PCN reaction causing immediate rash, facial/tongue/throat swelling, SOB or lightheadedness with hypotension: Yes Has patient had a PCN reaction causing severe rash involving mucus membranes or skin necrosis: No Has patient had a PCN reaction that required hospitalization No Has patient had a PCN reaction occurring within the last 10 years: Yes If all of the above answers are "NO", then may proceed with Cephalosporin use.   . Simvastatin Rash     Labs: Recent Labs    04/20/18 0504 04/21/18 0406 04/22/18 0359  HGB 8.6* 8.7* 9.0*  HCT 26.2* 26.1* 26.9*  PLT 292 334 358  HEPARINUNFRC 0.61 0.57 0.69  CREATININE  --  1.31* 1.02*    Estimated Creatinine Clearance: 65.2 mL/min (A) (by C-G formula based on SCr of 1.02 mg/dL (H)).  Assessment: 72 yo F with acute RUE DVT from PICC. IV heparin. HL 0.69 on Heparin IV drip 1500 units/hr.  CBC low but stable. No bleeding reported.  Awaiting further recommendation from surgery then will need to transition to oral anticoagulation.   Goal of Therapy:  Heparin level 0.3-0.7 units/ml Monitor platelets by anticoagulation protocol: Yes   Plan:  Continue Heparin at 1500 units/hr Daily HL and CBC F/u for oral anticoagulation plan.  Nicole Cella, RPh Clinical Pharmacist Please check AMION for all [place] phone numbers After 10:00 PM, call [place] [phone] 04/22/2018 3:03 PM

## 2018-04-22 NOTE — Progress Notes (Signed)
Physical Therapy Treatment Patient Details Name: [name] MRN: [id number] DOB: December 06, 1945 Today's Date: 04/22/2018    History of Present Illness [name]  is a 72 y.o. female, w hypertension, hyperlipidemia, dm2, pvd, CVA, OSA, Rheumatoid arthritis apparently c/o LLQ pain over the past several weeks, just started on abx yesterday, pt notes subjective fever as well as nausea.  Pt denies emesis, constipation, diarrhea, brbpr, black stool.  Pt presented due to worsening abdominal pain today. Found to have acute perforated sigmoid diverticulitis with multiple abscesses. Pt is s/p abdominal abscess drains X2.     PT Comments    Pt limited the session to bed exercises.  Discussed how sessions have to go after today.   Follow Up Recommendations  SNF     Equipment Recommendations  None recommended by PT    Recommendations for Other Services       Precautions / Restrictions      Mobility  Bed Mobility               General bed mobility comments: defferred OOB  Transfers                    Ambulation/Gait                 Stairs             Wheelchair Mobility    Modified Rankin (Stroke Patients Only)       Balance                                            Cognition                                              Exercises Other Exercises Other Exercises: AP x30, QS x10, heel slides with resisted extension x10, bicep/tricep presses x10 all bil.    General Comments        Pertinent Vitals/Pain      Home Living                      Prior Function            PT Goals (current goals can now be found in the care plan section) Acute Rehab PT Goals Patient Stated Goal: to decrease back pain  PT Goal Formulation: With patient/family Time For Goal Achievement: 04/28/18 Potential to Achieve Goals: Fair Progress towards PT goals: Not progressing toward goals - comment(pt has been  self limiting lately)    Frequency    Min 3X/week      PT Plan Current plan remains appropriate    Co-evaluation              AM-PAC PT "6 Clicks" Daily Activity  Outcome Measure  Difficulty turning over in bed (including adjusting bedclothes, sheets and blankets)?: Unable Difficulty moving from lying on back to sitting on the side of the bed? : Unable Difficulty sitting down on and standing up from a chair with arms (e.g., wheelchair, bedside commode, etc,.)?: Unable Help needed moving to and from a bed to chair (including a wheelchair)?: A Lot Help needed walking in hospital room?: Total Help needed climbing 3-5 steps with a railing? : Total 6 Click  Score: 7    End of Session   Activity Tolerance: Patient limited by pain Patient left: in bed;with call bell/phone within reach Nurse Communication: Mobility status PT Visit Diagnosis: Unsteadiness on feet (R26.81);Muscle weakness (generalized) (M62.81)     Time: 7544-9201 PT Time Calculation (min) (ACUTE ONLY): 21 min  Charges:  $Therapeutic Exercise: 8-22 mins                     04/22/2018  Donnella Sham, PT Acute Rehabilitation Services [phone]  (pager) [phone]  (office)   Tessie Fass Dolly Harbach 04/22/2018, 6:10 PM

## 2018-04-22 NOTE — Progress Notes (Signed)
Referring Physician(s): Dr Georgiann Cocker  Supervising Physician: Markus Daft  Patient Status:  [facility] - In-pt  Chief Complaint:  Intra abd abscess drains x 2 placed in IR 11/6 Diverticular abscess  Subjective:  In bed-- does not get out of bed Painful abd and back Drains intact TG Right and left mid abd 700 cc OP per chart -- ?? If correct Pt states nothing really comes out  Allergies: Fentanyl; Lactose intolerance (gi); Butrans [buprenorphine]; Penicillins; and Simvastatin  Medications: Prior to Admission medications   Medication Sig Start Date End Date Taking? Authorizing Provider  Blood Glucose Monitoring Suppl (FREESTYLE LITE) DEVI See admin instructions. 12/22/14  Yes [provider]  ciprofloxacin (CIPRO) 500 MG/5ML (10%) suspension Take by mouth 2 (two) times daily.   Yes [provider]  clopidogrel (PLAVIX) 75 MG tablet Take 1 tablet (75 mg total) by mouth daily with breakfast. 01/14/18  Yes Rehman, Mechele Dawley, MD  Dexlansoprazole 30 MG capsule Take 40 mg by mouth daily.   Yes [provider]  diclofenac sodium (VOLTAREN) 1 % GEL Apply 2 g topically 4 (four) times daily as needed (Pain).    Yes [provider]  glipiZIDE (GLUCOTROL) 10 MG tablet Take 10 mg by mouth daily before breakfast.   Yes [provider]  HYDROcodone-acetaminophen (NORCO) 10-325 MG tablet Take 1 tablet by mouth 4 (four) times daily.   Yes [provider]  IRON PO Take 1 tablet by mouth daily.   Yes [provider]  isosorbide dinitrate (ISORDIL) 5 MG tablet Take 1 tablet (5 mg total) by mouth 3 (three) times daily before meals. 02/07/18  Yes Rehman, Mechele Dawley, MD  leflunomide (ARAVA) 20 MG tablet Take 20 mg by mouth daily.   Yes [provider]  metoprolol succinate (TOPROL-XL) 25 MG 24 hr tablet Take 25 mg by mouth daily.   Yes [provider]  metroNIDAZOLE (FLAGYL) 250 MG tablet Take 250 mg by mouth 3 (three) times daily.    Yes [provider]  Olmesartan-amLODIPine-HCTZ (TRIBENZOR) 40-5-25 MG TABS Take 1 tablet by mouth daily.    Yes [provider]  pravastatin (PRAVACHOL) 40 MG tablet Take 40 mg by mouth at bedtime.   Yes [provider]  predniSONE (DELTASONE) 5 MG tablet Take 5 mg by mouth daily with breakfast.   Yes [provider]  pregabalin (LYRICA) 75 MG capsule Take 75 mg by mouth 3 (three) times daily.   Yes [provider]  Tafluprost (ZIOPTAN) 0.0015 % SOLN Place 1 drop into both eyes at bedtime.    Yes [provider]  temazepam (RESTORIL) 30 MG capsule Take 30 mg by mouth at bedtime.   Yes [provider]  timolol (BETIMOL) 0.5 % ophthalmic solution Place 1 drop into both eyes 2 (two) times daily.    Yes [provider]  Tofacitinib Citrate (XELJANZ) 5 MG TABS Take 1 tablet by mouth daily.   Yes [provider]  VITAMIN D, ERGOCALCIFEROL, PO Take 50,000 Units by mouth once a week. Thursdays   Yes [provider]  docusate sodium (COLACE) 100 MG capsule Take 2 capsules (200 mg total) by mouth at bedtime. Patient taking differently: Take 200 mg by mouth as needed.  02/13/18   Rogene Houston, MD     Vital Signs: BP 140/77   Pulse 87   Temp 98 F (36.7 C) (Oral)   Resp 18   Ht 5' 5.5" (1.664 m)   Wt  271 lb 2.7 oz (123 kg)   SpO2 97%   BMI 44.44 kg/m   Physical Exam  Abdominal: Normal appearance. Bowel sounds are decreased. There is generalized tenderness.  Skin: Skin is warm and dry.  Sites of drains are clean and dry Tender TG right drain OP brown thick fluid Odorous  Left abd drain OP milky yellow Site less tender  Multiple orgs per Cx  Vitals reviewed.   Imaging: No results found.  Labs:  CBC: Recent Labs    04/19/18 0419 04/20/18 0504 04/21/18 0406 04/22/18 0359  WBC 7.2 7.4 7.8 7.7  HGB 9.0* 8.6* 8.7* 9.0*  HCT 27.1* 26.2* 26.1* 26.9*  PLT 239 292 334 358     COAGS: Recent Labs    04/06/18 1952  INR 1.22  APTT 33    BMP: Recent Labs    04/18/18 0309 04/19/18 0419 04/21/18 0406 04/22/18 0359  NA 138 138 138 139  K 3.1* 3.5 3.3* 3.3*  CL 99 100 97* 97*  CO2 28 30 31  34*  GLUCOSE 106* 96 91 109*  BUN 15 20 20 17   CALCIUM 7.3* 7.4* 7.6* 7.7*  CREATININE 1.09* 1.07* 1.31* 1.02*  GFRNONAA 49* 50* 39* 53*  GFRAA 57* 58* 46* >60    LIVER FUNCTION TESTS: Recent Labs    04/08/18 0415 04/09/18 0355 04/10/18 0351 04/15/18 0859  BILITOT 0.6 0.5 0.7 1.0  AST 46* 54* 41 69*  ALT 33 36 30 43  ALKPHOS 48 55 51 63  PROT 5.6* 5.2* 5.5* 5.6*  ALBUMIN 2.3* 2.2* 2.2* 2.1*    Assessment and Plan:  Drains intact Rt TG purulent Left mid abd drain yellow- milky color Only 20 cc each this am Dr Kae Heller note: for re CT today--- considering OR dependent on results Will follow Plan per [facility]  Electronically Signed: Latangela Mccomas A, PA-C 04/22/2018, 10:10 AM   I spent a total of 25 Minutes at the the patient's bedside AND on the patient's hospital floor or unit, greater than 50% of which was counseling/coordinating care for Rt TG drain ; L mid abd drain

## 2018-04-22 NOTE — Consult Note (Signed)
[place] Nurse requested for preoperative stoma site marking  Discussed surgical procedure and stoma creation with patient and family.  Explained role of the [place] nurse team.  Provided the patient with educational booklet and provided samples of pouching options.  Answered patient and family questions.   Examined patient lying, sitting, and standing in order to place the marking in the patient's visual field, away from any creases or abdominal contour issues and within the rectus muscle.  Attempted to mark below the patient's belt line.   Marked for colostomy in the LUQ  __9__ cm to the left of the umbilicus and __7__NH above the umbilicus.  Patient's abdomen cleansed with CHG wipes at site markings, allowed to air dry prior to marking.Covered mark with thin film transparent dressing to preserve mark until date of surgery.   [place] Nurse team will follow up with patient after surgery for continue ostomy care and teaching.   Val Riles, RN, MSN, CWOCN, CNS-BC, pager [phone]

## 2018-04-22 NOTE — Progress Notes (Signed)
[facility] Progress Note     Subjective: CC-  Patient states that her abdominal pain is a little better today. Denies n/v. 3 loose BMs yesterday. States that she drank 2 Ensure yesterday. Main complaint is persistent back ok. Currently ok, but afraid to move as this may increase her pain. Did not sit in chair yesterday.  Objective: Vital signs in last 24 hours: Temp:  [98 F (36.7 C)-98.6 F (37 C)] 98 F (36.7 C) (11/12 0415) Pulse Rate:  [84-162] 92 (11/12 0415) Resp:  [18] 18 (11/11 1418) BP: (96-135)/(44-77) 135/63 (11/12 0415) SpO2:  [97 %-98 %] 97 % (11/12 0415) Last BM Date: 04/21/18  Intake/Output from previous day: 11/11 0701 - 11/12 0700 In: 280 [P.O.:280] Out: 700 [Urine:700] Intake/Output this shift: Total I/O In: -  Out: 700 [Drains:700]  PE: Gen:  Alert, NAD HEENT: EOM's intact, pupils equal and round Pulm:  effort normal Abd: obese, soft, +BS, no HSM, drain x2 with purulent drainage, mild diffuse tenderness/most significant in RLQ and LLQ, no peritonitis Psych: A&Ox3 Skin: no rashes noted, warm and dry  Lab Results:  Recent Labs    04/21/18 0406 04/22/18 0359  WBC 7.8 7.7  HGB 8.7* 9.0*  HCT 26.1* 26.9*  PLT 334 358   BMET Recent Labs    04/21/18 0406 04/22/18 0359  NA 138 139  K 3.3* 3.3*  CL 97* 97*  CO2 31 34*  GLUCOSE 91 109*  BUN 20 17  CREATININE 1.31* 1.02*  CALCIUM 7.6* 7.7*   PT/INR No results for input(s): LABPROT, INR in the last 72 hours. CMP     Component Value Date/Time   NA 139 04/22/2018 0359   K 3.3 (L) 04/22/2018 0359   CL 97 (L) 04/22/2018 0359   CO2 34 (H) 04/22/2018 0359   GLUCOSE 109 (H) 04/22/2018 0359   BUN 17 04/22/2018 0359   CREATININE 1.02 (H) 04/22/2018 0359   CALCIUM 7.7 (L) 04/22/2018 0359   PROT 5.6 (L) 04/15/2018 0859   ALBUMIN 2.1 (L) 04/15/2018 0859   AST 69 (H) 04/15/2018 0859   ALT 43 04/15/2018 0859   ALKPHOS 63 04/15/2018 0859   BILITOT 1.0 04/15/2018 0859   GFRNONAA  53 (L) 04/22/2018 0359   GFRAA >60 04/22/2018 0359   Lipase  No results found for: LIPASE     Studies/Results: No results found.  Anti-infectives: Anti-infectives (From admission, onward)   Start     Dose/Rate Route Frequency Ordered Stop   04/15/18 1700  meropenem (MERREM) 1 g in sodium chloride 0.9 % 100 mL IVPB     1 g 200 mL/hr over 30 Minutes Intravenous Every 8 hours 04/15/18 1534     04/09/18 2200  meropenem (MERREM) 1 g in sodium chloride 0.9 % 100 mL IVPB  Status:  Discontinued     1 g 200 mL/hr over 30 Minutes Intravenous Every 8 hours 04/09/18 1244 04/14/18 0944   04/06/18 1300  meropenem (MERREM) 1 g in sodium chloride 0.9 % 100 mL IVPB  Status:  Discontinued     1 g 200 mL/hr over 30 Minutes Intravenous Every 12 hours 04/06/18 1145 04/09/18 1244   04/06/18 1145  meropenem (MERREM) 1 g in sodium chloride 0.9 % 100 mL IVPB  Status:  Discontinued     1 g 200 mL/hr over 30 Minutes Intravenous Every 8 hours 04/06/18 1139 04/06/18 1145   04/06/18 0900  ciprofloxacin (CIPRO) IVPB 400 mg  Status:  Discontinued     400  mg 200 mL/hr over 60 Minutes Intravenous Every 12 hours 04/06/18 0732 04/06/18 1139   04/06/18 0300  metroNIDAZOLE (FLAGYL) IVPB 500 mg  Status:  Discontinued     500 mg 100 mL/hr over 60 Minutes Intravenous Every 8 hours 04/05/18 2335 04/06/18 1139   04/05/18 1930  ciprofloxacin (CIPRO) IVPB 400 mg     400 mg 200 mL/hr over 60 Minutes Intravenous  Once 04/05/18 1925 04/05/18 2217   04/05/18 1930  metroNIDAZOLE (FLAGYL) IVPB 500 mg     500 mg 100 mL/hr over 60 Minutes Intravenous  Once 04/05/18 1925 04/05/18 2102       Assessment/Plan HTN HLD 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 - on plavix (last dose 10/27) H/o CVA  Morbidly obese Chronic pain RUE DVT - on IV heparin  Acute perforated sigmoid diverticulitis with multiple abscesses - CT scan 11/5 revealsat least 3 fluid collectionsin the lower abdomen/pelvis with previous perforated  diverticulitis; fluid collections are7.3 x 5.8 cm in left lower quadrant, air-fluid collection measuring 8.2 x 5.9 cm in the right lower quadrant, and8.9 x 5.3 cm fluid collection in the pelvis in the pre rectal space which appears to contain stool and fluid - s/pIR perc drains x 2 - 04/16/18, culture with multiple organisms present/none predominant  ID -currently merrem 10/27>> VTE -SCDs, IV heparin FEN -IVF, bariatric clears, Ensure  Plan: Repeat CT scan today. Continue clears/Ensure for now. Continue drains and IV antibiotics. Add robaxin for back pain.   LOS: 15 days    Wellington [place] , [facility] Surgery 04/22/2018, 8:58 AM Pager: [phone] Mon 7:00 am -11:30 AM Tues-Fri 7:00 am-4:30 pm Sat-Sun 7:00 am-11:30 am

## 2018-04-23 ENCOUNTER — Encounter (HOSPITAL_COMMUNITY): Payer: Self-pay | Admitting: Physician Assistant

## 2018-04-23 ENCOUNTER — Inpatient Hospital Stay: Payer: Self-pay

## 2018-04-23 ENCOUNTER — Inpatient Hospital Stay (HOSPITAL_COMMUNITY): Payer: Medicare Other

## 2018-04-23 LAB — GLUCOSE, CAPILLARY
GLUCOSE-CAPILLARY: 91 mg/dL (ref 70–99)
Glucose-Capillary: 116 mg/dL — ABNORMAL HIGH (ref 70–99)
Glucose-Capillary: 83 mg/dL (ref 70–99)
Glucose-Capillary: 85 mg/dL (ref 70–99)
Glucose-Capillary: 94 mg/dL (ref 70–99)
Glucose-Capillary: 97 mg/dL (ref 70–99)

## 2018-04-23 LAB — CBC
HEMATOCRIT: 28.2 % — AB (ref 36.0–46.0)
HEMOGLOBIN: 9 g/dL — AB (ref 12.0–15.0)
MCH: 29.1 pg (ref 26.0–34.0)
MCHC: 31.9 g/dL (ref 30.0–36.0)
MCV: 91.3 fL (ref 80.0–100.0)
Platelets: 405 10*3/uL — ABNORMAL HIGH (ref 150–400)
RBC: 3.09 MIL/uL — AB (ref 3.87–5.11)
RDW: 16.5 % — ABNORMAL HIGH (ref 11.5–15.5)
WBC: 9.1 10*3/uL (ref 4.0–10.5)
nRBC: 0.9 % — ABNORMAL HIGH (ref 0.0–0.2)

## 2018-04-23 LAB — BASIC METABOLIC PANEL
Anion gap: 11 (ref 5–15)
BUN: 17 mg/dL (ref 8–23)
CALCIUM: 7.9 mg/dL — AB (ref 8.9–10.3)
CO2: 33 mmol/L — ABNORMAL HIGH (ref 22–32)
CREATININE: 1.11 mg/dL — AB (ref 0.44–1.00)
Chloride: 94 mmol/L — ABNORMAL LOW (ref 98–111)
GFR calc Af Amer: 56 mL/min — ABNORMAL LOW (ref >60)
GFR, EST NON AFRICAN AMERICAN: 48 mL/min — AB (ref >60)
Glucose, Bld: 97 mg/dL (ref 70–99)
POTASSIUM: 3.6 mmol/L (ref 3.5–5.1)
SODIUM: 138 mmol/L (ref 135–145)

## 2018-04-23 LAB — TYPE AND SCREEN
ABO/RH(D): O POS
ANTIBODY SCREEN: NEGATIVE

## 2018-04-23 LAB — AEROBIC/ANAEROBIC CULTURE W GRAM STAIN (SURGICAL/DEEP WOUND)

## 2018-04-23 LAB — PROTIME-INR
INR: 1.25
PROTHROMBIN TIME: 15.6 s — AB (ref 11.4–15.2)

## 2018-04-23 LAB — AEROBIC/ANAEROBIC CULTURE (SURGICAL/DEEP WOUND)

## 2018-04-23 LAB — ABO/RH: ABO/RH(D): O POS

## 2018-04-23 LAB — HEPARIN LEVEL (UNFRACTIONATED): HEPARIN UNFRACTIONATED: 0.58 [IU]/mL (ref 0.30–0.70)

## 2018-04-23 MED ORDER — HEPARIN (PORCINE) 25000 UT/250ML-% IV SOLN: 1500.0000 [IU]/h | INTRAVENOUS | Status: DC

## 2018-04-23 MED ORDER — SODIUM CHLORIDE 0.9% FLUSH
5.0000 mL | Freq: Three times a day (TID) | INTRAVENOUS | Status: DC
  Administered 2018-04-23 – 2018-05-06 (×34): 5 mL

## 2018-04-23 MED ORDER — HYDROMORPHONE HCL 1 MG/ML IJ SOLN
INTRAMUSCULAR | Status: AC | PRN
  Administered 2018-04-23: 0.5 mg via INTRAVENOUS

## 2018-04-23 MED ORDER — INSULIN ASPART 100 UNIT/ML ~~LOC~~ SOLN
0.0000 [IU] | SUBCUTANEOUS | Status: DC
  Administered 2018-04-24 – 2018-04-27 (×12): 2 [IU] via SUBCUTANEOUS

## 2018-04-23 MED ORDER — LIDOCAINE HCL 1 % IJ SOLN
INTRAMUSCULAR | Status: AC
  Filled 2018-04-23: qty 20

## 2018-04-23 MED ORDER — FUROSEMIDE 10 MG/ML IJ SOLN
40.0000 mg | Freq: Two times a day (BID) | INTRAMUSCULAR | Status: DC
  Administered 2018-04-23 – 2018-04-29 (×12): 40 mg via INTRAVENOUS
  Filled 2018-04-23 (×13): qty 4

## 2018-04-23 MED ORDER — HYDROMORPHONE HCL 1 MG/ML IJ SOLN
INTRAMUSCULAR | Status: AC
  Filled 2018-04-23: qty 2

## 2018-04-23 MED ORDER — MIDAZOLAM HCL 2 MG/2ML IJ SOLN
INTRAMUSCULAR | Status: AC
  Filled 2018-04-23: qty 4

## 2018-04-23 MED ORDER — MIDAZOLAM HCL 2 MG/2ML IJ SOLN
INTRAMUSCULAR | Status: AC | PRN
  Administered 2018-04-23: 1 mg via INTRAVENOUS

## 2018-04-23 MED ORDER — HEPARIN (PORCINE) 25000 UT/250ML-% IV SOLN
1500.0000 [IU]/h | INTRAVENOUS | Status: AC
  Administered 2018-04-23 – 2018-04-24 (×2): 1500 [IU]/h via INTRAVENOUS
  Filled 2018-04-23: qty 250

## 2018-04-23 MED ORDER — SODIUM CHLORIDE 0.9% FLUSH
10.0000 mL | INTRAVENOUS | Status: DC | PRN
  Administered 2018-04-29: 15 mL
  Administered 2018-04-30: 10 mL
  Administered 2018-04-30: 15 mL
  Administered 2018-05-06 – 2018-05-07 (×3): 10 mL
  Administered 2018-05-07: 20 mL
  Filled 2018-04-23 (×7): qty 40

## 2018-04-23 MED ORDER — TRAVASOL 10 % IV SOLN
INTRAVENOUS | Status: AC
  Administered 2018-04-23: 19:00:00 via INTRAVENOUS
  Filled 2018-04-23: qty 480

## 2018-04-23 NOTE — Procedures (Signed)
  Procedure: CT perc abscess drain 70f RLQ   EBL:   minimal Complications:  none immediate  See full dictation in [facility].  Dillard Cannon MD Main # [phone] Pager  [phone]

## 2018-04-23 NOTE — Progress Notes (Addendum)
Nutrition Follow-up  DOCUMENTATION CODES:   Morbid obesity  INTERVENTION:  -TPN per pharmacy   -Pt at refeeding risk; monitor potassium, magnesium, phosphorus and replete as needed   -D/c Ensure and Glucerna, pt is NPO and will meet 100% of needs through TPN   NUTRITION DIAGNOSIS:   Inadequate oral intake related to inability to eat as evidenced by energy intake < or equal to 50% for > or equal to 5 days(NPO/clear liquid status >5 days).  Ongoing  GOAL:   Patient will meet greater than or equal to 90% of their needs  To meet with TPN; starting at 40 ml/hr with goal rate 80 ml/hr  MONITOR:   PO intake, Diet advancement, Labs  REASON FOR ASSESSMENT:   Malnutrition Screening Tool    ASSESSMENT:   72 year old female with PMH significant for CAD, HLD/HTN, CVA, T2DM, RA, and fibromyalgia/chronic pain. Pt presented w/ 2 weeks of lower abdominal pain. Had recently been dx w/ UTI and palced on abx, but pain continued. Initial CT scan showed diverticulitis w/o perf, but pt developed hypotension and repeat CT did show perforation. Surgery consulted.  11/12 CT scan showed resolution of large pelvic abscess, minimal residual mesenteric abscess, additional drain placed  11/13 to replace PICC and start TPN  TPN to start at 40 ml/hr this evening.  Per pharmacy goal rate is 80 ml/hr providing 96 g protein, 326 g dextrose, and 38.4 g lipids providing 1877 kcal, meeting 100% of patient's needs. Standard electrolytes and MVI, trace elements included.   Spoke with pt and her husband in room today regarding initiation of TPN. Pt and husband seem concerned about PICC replacement considering DVT which occurred in the setting of last PICC, which was removed 11/6. Assured pt and husband that the TPN will be monitored closely.   Repeated physical exam. Difficulty deciphering muscle wasting due to swelling. Pt also reports pain in LE due to fibromyalgia. Pt appears to be slightly jaundiced  today.   Meds: lasix, novolog ss, Protonix, TPN @ 40 start 1800 Labs: CBGs 85-97, GFR 48, Hgb 9.0, Cr 1.11   NUTRITION - FOCUSED PHYSICAL EXAM:    Most Recent Value  Orbital Region  No depletion  Upper Arm Region  No depletion  Thoracic and Lumbar Region  No depletion  Buccal Region  No depletion  Temple Region  Mild depletion  Clavicle Bone Region  No depletion  Clavicle and Acromion Bone Region  No depletion  Scapular Bone Region  No depletion  Dorsal Hand  No depletion  Patellar Region  Mild depletion  Anterior Thigh Region  Mild depletion  Posterior Calf Region  Mild depletion  Edema (RD Assessment)  Moderate  Hair  Reviewed  Eyes  Reviewed  Mouth  Reviewed  Skin  Reviewed  Nails  Reviewed      Diet Order:   Diet Order            Diet NPO time specified  Diet effective midnight              EDUCATION NEEDS:   Education needs have been addressed  Skin:  Skin Assessment: Reviewed RN Assessment  Last BM:  11/11  Height:   Ht Readings from Last 1 Encounters:  04/16/18 5' 5.5" (1.664 m)    Weight:   Wt Readings from Last 1 Encounters:  04/23/18 118.2 kg    Ideal Body Weight:  56.82 kg  BMI:  Body mass index is 42.7 kg/m.  Estimated Nutritional Needs:  Kcal:  1600-1800 (MSJ +/- 100)  Protein:  85-100g Pro (1.5-1.8g/kg ibw)  Fluid:  1.6-1.8 L fluid (80ml/kcal)    Ashawna Hanback, Dietetic Intern [phone]

## 2018-04-23 NOTE — Progress Notes (Addendum)
ANTICOAGULATION CONSULT NOTE - Follow Up Consult  Pharmacy Consult for Heparin Indication: DVT  Allergies  Allergen Reactions  . Fentanyl Anaphylaxis and Shortness Of Breath  . Lactose Intolerance (Gi) Other (See Comments)    G.I. Upset  . Butrans [Buprenorphine] Rash and Other (See Comments)    Infected skin underneath application  . Penicillins Rash    Facial rash Has patient had a PCN reaction causing immediate rash, facial/tongue/throat swelling, SOB or lightheadedness with hypotension: Yes Has patient had a PCN reaction causing severe rash involving mucus membranes or skin necrosis: No Has patient had a PCN reaction that required hospitalization No Has patient had a PCN reaction occurring within the last 10 years: Yes If all of the above answers are "NO", then may proceed with Cephalosporin use.   . Simvastatin Rash     Labs: Recent Labs    04/21/18 0406 04/22/18 0359 04/23/18 0329  HGB 8.7* 9.0* 9.0*  HCT 26.1* 26.9* 28.2*  PLT 334 358 405*  LABPROT  --   --  15.6*  INR  --   --  1.25  HEPARINUNFRC 0.57 0.69 0.58  CREATININE 1.31* 1.02* 1.11*    Estimated Creatinine Clearance: 58.6 mL/min (A) (by C-G formula based on SCr of 1.11 mg/dL (H)).  Assessment: 72 yo F with acute RUE DVT from PICC. IV heparin.  HL 0.58 on heparin drip 1500 ut/hr (HL drawn before hep stopped at 4AM 11/13 for drain procedure) CBC low but stable.  No bleeding reported We will F/u for restart of Heparin drip  post CT guided drainage by percutaneous catheter  & will need transition to po anticoagulant.   Goal of Therapy:  Heparin level 0.3-0.7 units/ml Monitor platelets by anticoagulation protocol: Yes   Plan:  Heparin drip stopped at 4AM  11/13 per surgery PA for CT image guided drainage  by percutaneous catheter. F/u post IR for heparin restart.  Daily HL and CBC F/u plan for PO anticoagulation (new).   F/u for oral anticoagulation plan.  Nicole Cella, RPh Clinical  Pharmacist Please check AMION for all [facility] phone numbers After 10:00 PM, call [facility] [phone] 04/23/2018 11:08 AM   Addendum: S/p CT Perc abcess drain, restart IV heparin , d/w Dr. Waldron Labs, per IR guidelines wil restart IV heparin drip 6 hours post abscess drainage.  Exam ended 14:35  PLAN: Resume heparin at previous rate 1500 units/hr at 20:30 tonight. Check 8 hour HL Daily HL, CBC.   Nicole Cella, RPh Clinical Pharmacist 04/23/2018 2:55 PM

## 2018-04-23 NOTE — Progress Notes (Signed)
Patient Demographics:    [name], is a 72 y.o. female, DOB - 1945/08/16, [id number]  Admit date - 04/05/2018   Admitting Physician [name], MD  Outpatient Primary MD for the patient is [name], MD  LOS - 11   Chief Complaint  Patient presents with  . Abdominal Pain      Brief narrative:  72yo female PMH HTN, DM-II, CKD, RA, H/o CAD s/p CABG (on plavix,), who was admitted to [facility] 10/26 with acute sigmoid diverticulitis without perforation or abscess. Initially felt to be improving on IV merrem, but she continued to have abdominal pain, CT abdomen pelvis was obtained 04/15/2018, was significant for worsening diverticulitis now with multiple abdominal fluid collections.  Patient developed acute right upper extremity DVT in the setting of PICC line, she was transferred from [facility] to [facility] 04/15/2018 for evaluation by IR.  She has drain placed by IR 04/16/2018   Subjective:    [name] denies any fever or chills overnight, no further diarrhea, reports abdominal pain at baseline, no significant events overnight.   Assessment  & P lan :    Principal Problem:   Diverticulitis Active Problems:   DM (diabetes mellitus) ([place])   HTN (hypertension)   Rheumatoid arthritis ([place])   Anemia   Renal insufficiency   Sepsis due to undetermined organism ([place])   CKD (chronic kidney disease) stage 3, GFR 30-59 ml/min (HCC)   Fibromyalgia   Diverticulitis large intestine w/o perforation or abscess w/o bleeding   Acute diverticulitis   Perforated sigmoid colon (HCC)   Acute renal failure superimposed on stage 3 chronic kidney disease ([place])   Sepsis due to acute perforated sigmoid diverticulitis- -sepsis criteria met on admission - imaging 04/06/2018 on admission significant for perforated sigmoid diverticulitis, was managed conservatively on IV meropenem, clear liquid diet, . -Repeat  imaging 04/15/2018 with large abscesses in the abdomen, transferred to [facility] for further care, general surgery consult greatly appreciated. -IR consulted, they have placed to drainage in her mesenteric abscess, pelvic abscess, cultures were growing multiple organisms, none predominant, no anaerobes isolated, continue with meropenem. -Repeat CT abdomen pelvis today showing large pelvis abscess, as well 1 of the mesenteric abscesses has resolved, though communicating with another still present abscess , and still present large right abdomen gas/fluid area, discussed with general surgery, recommendation for IR to drain, IR to place drain in the right abdomen gas/fluid area . -Overall patient with very poor oral intake, was ordered to be started on TPN vaginal surgery, PICC line to be inserted (right upper extremity restricted secondary to acute DVT ).  Acute right upper extremity DVT -In the setting of PICC line insertion, start on anticoagulation, now continue with heparin GTT for anticoagulation, we will not initiate any oral anticoagulation for possible need for surgical intervention, or further procedures. -PICC line  discontinued 04/16/2018  acute kidney injury on CKD3. Baseline creat 1.2.   - AKI resolved w/ IVF but creat down to 0.7 now, appears to be volume overloaded, continue with diuresis as blood pressure tolerates.  Fibromyalgia -Discussed with the patient, and on her home dose Lyrica  Hypertension -Continue with metoprolol, given soft blood pressure continue to hold home medication including as  isosorbide/ olmesartan/amlodipine/HCTZ  combo -Patient remains on IV Lasix, still with some volume overload, will increase her Lasix to 40 twice daily   DM2 -   BS's are good, cont Novolog/Humalog Sliding scale insulin  H/o CAD sp CABG /hx TIA - previously on Plavix and pravastatin, continue to hold Plavix may need further procedures or surgery  Debility -PT is consulted, SNF is  recommended -Patient has not been very compliant with her physical therapy, the hard to convince to participate in PT, and sit on the chair, will continue to encourage with physical activity.   acute on chronic anemia--- sp 2 u prbc's,   Started on Lexapro for depression  Paroxysmal atrial tachycardia -She had episode of PAT 04/22/2018 overnight, sit for a few minutes, she had echo done hospital stay with a preserved EF, her beta-blocker has been increased, no recurrence since.    Code Status : full  Family communication: Husband at bedside  Disposition Plan  : TBD, will prob need SNF  Consults  :  Gen Surgery, IR  DVT Prophylaxis  :   SCDs, heparin GTT   Lab Results  Component Value Date   PLT 405 (H) 04/23/2018    Inpatient Medications  Scheduled Meds: . HYDROmorphone      . lidocaine      . Chlorhexidine Gluconate Cloth  6 each Topical Daily  . escitalopram  20 mg Oral QHS  . feeding supplement (ENSURE ENLIVE)  237 mL Oral TID BM  . feeding supplement (GLUCERNA SHAKE)  237 mL Oral TID BM  . furosemide  40 mg Intravenous Daily  . insulin aspart  0-15 Units Subcutaneous Q4H  . latanoprost  1 drop Both Eyes QHS  . metoprolol tartrate  37.5 mg Oral BID  . nystatin   Topical TID  . pantoprazole  40 mg Oral Daily  . pravastatin  40 mg Oral QHS  . pregabalin  50 mg Oral QHS  . sodium chloride flush  10-40 mL Intracatheter Q12H  . sodium chloride flush  5 mL Intracatheter Q8H  . timolol  1 drop Both Eyes BID   Continuous Infusions: . meropenem (MERREM) IV 1 g (04/23/18 1051)  . TPN ADULT (ION)     PRN Meds:.acetaminophen **OR** acetaminophen, albuterol, hydrALAZINE, HYDROcodone-acetaminophen, methocarbamol, ondansetron (ZOFRAN) IV, oxyCODONE, senna-docusate, sodium chloride flush, traMADol    Anti-infectives (From admission, onward)   Start     Dose/Rate Route Frequency Ordered Stop   04/15/18 1700  meropenem (MERREM) 1 g in sodium chloride 0.9 % 100 mL IVPB      1 g 200 mL/hr over 30 Minutes Intravenous Every 8 hours 04/15/18 1534     04/09/18 2200  meropenem (MERREM) 1 g in sodium chloride 0.9 % 100 mL IVPB  Status:  Discontinued     1 g 200 mL/hr over 30 Minutes Intravenous Every 8 hours 04/09/18 1244 04/14/18 0944   04/06/18 1300  meropenem (MERREM) 1 g in sodium chloride 0.9 % 100 mL IVPB  Status:  Discontinued     1 g 200 mL/hr over 30 Minutes Intravenous Every 12 hours 04/06/18 1145 04/09/18 1244   04/06/18 1145  meropenem (MERREM) 1 g in sodium chloride 0.9 % 100 mL IVPB  Status:  Discontinued     1 g 200 mL/hr over 30 Minutes Intravenous Every 8 hours 04/06/18 1139 04/06/18 1145   04/06/18 0900  ciprofloxacin (CIPRO) IVPB 400 mg  Status:  Discontinued     400 mg 200 mL/hr over 60 Minutes Intravenous Every  12 hours 04/06/18 0732 04/06/18 1139   04/06/18 0300  metroNIDAZOLE (FLAGYL) IVPB 500 mg  Status:  Discontinued     500 mg 100 mL/hr over 60 Minutes Intravenous Every 8 hours 04/05/18 2335 04/06/18 1139   04/05/18 1930  ciprofloxacin (CIPRO) IVPB 400 mg     400 mg 200 mL/hr over 60 Minutes Intravenous  Once 04/05/18 1925 04/05/18 2217   04/05/18 1930  metroNIDAZOLE (FLAGYL) IVPB 500 mg     500 mg 100 mL/hr over 60 Minutes Intravenous  Once 04/05/18 1925 04/05/18 2102        Objective:   Vitals:   04/22/18 1655 04/22/18 2015 04/23/18 0444 04/23/18 0500  BP:  (!) 100/51 123/61   Pulse:  87 83   Resp:  18 16   Temp: 99.6 F (37.6 C) 98.2 F (36.8 C) 98.1 F (36.7 C)   TempSrc: Oral Oral Oral   SpO2:  98% 98%   Weight:    118.2 kg  Height:        Wt Readings from Last 3 Encounters:  04/23/18 118.2 kg  02/13/18 116.1 kg  01/13/18 120.7 kg     Intake/Output Summary (Last 24 hours) at 04/23/2018 1330 Last data filed at 04/23/2018 1051 Gross per 24 hour  Intake 315 ml  Output 1200 ml  Net -885 ml     Physical Exam  Awake Alert, Oriented X 3, No new F.N deficits, Normal affect Symmetrical Chest wall movement,  Good air movement bilaterally, CTAB RRR,No Gallops,Rubs or new Murmurs, No Parasternal Heave +ve B.Sounds, Abd Soft, right abdomen tenderness, patient has percutaneous drain x2 no tenderness, No rebound - guarding or rigidity. Edematous with no clubbing or cyanosis, right upper extremity edema significantly improved      Data Review:   Micro Results Recent Results (from the past 240 hour(s))  Aerobic/Anaerobic Culture (surgical/deep wound)     Status: Abnormal   Collection Time: 04/16/18  3:13 PM  Result Value Ref Range Status   Specimen Description PELVIS ABSCESS  Final   Special Requests NONE  Final   Gram Stain   Final    FEW WBC PRESENT,BOTH PMN AND MONONUCLEAR FEW GRAM POSITIVE COCCI IN CLUSTERS FEW GRAM POSITIVE RODS    Culture (A)  Final    MULTIPLE ORGANISMS PRESENT, NONE PREDOMINANT NO ANAEROBES ISOLATED Performed at [facility], [place] [address] [place], [place] [address]    Report Status 04/22/2018 FINAL  Final  Aerobic/Anaerobic Culture (surgical/deep wound)     Status: None   Collection Time: 04/16/18  3:13 PM  Result Value Ref Range Status   Specimen Description ABSCESS PELVIS  Final   Special Requests SAMPLE NO 2  Final   Gram Stain   Final    FEW WBC PRESENT,BOTH PMN AND MONONUCLEAR NO ORGANISMS SEEN Performed at [facility], [address], [place], [place] [address]    Culture   Final    RARE CLOSTRIDIUM CLOSTRIDIOFORME RARE PEPTOSTREPTOCOCCUS ASACCHAROLYTICUS    Report Status 04/23/2018 FINAL  Final    Radiology Reports Ct Abdomen Pelvis Wo Contrast  Result Date: 04/22/2018 CLINICAL DATA:  Follow-up diverticulitis EXAM: CT ABDOMEN AND PELVIS WITHOUT CONTRAST TECHNIQUE: Multidetector CT imaging of the abdomen and pelvis was performed following the standard protocol without IV contrast. Sagittal and coronal MPR images reconstructed from axial data set. Patient drank dilute oral contrast for exam COMPARISON:  04/15/2018 FINDINGS: Lower  chest: Bibasilar atelectasis and tiny pleural effusions. Calcified granulomata at lung bases.  Hepatobiliary: Gallbladder surgically absent. No focal hepatic abnormalities. Pancreas: Normal appearance Spleen: Normal appearance Adrenals/Urinary Tract: Adrenal glands normal appearance. Tiny nonobstructing calculus at inferior pole LEFT kidney. Kidneys, ureters, and bladder otherwise normal appearance. Stomach/Bowel: Stomach under distended with suboptimal assessment of wall thickness. Diffuse diverticulosis of the transverse, descending, and sigmoid colon. Persistent wall thickening of the sigmoid colon. Wall thickening also identified at the cecum extending to the ileocecal valve, slightly increased since previous study. No evidence of bowel obstruction. Small bowel loops unremarkable. Vascular/Lymphatic: Atherosclerotic calcifications aorta and iliac arteries. No adenopathy Reproductive: Uterus unremarkable.  Ovaries poorly visualized. Other: Pigtail drainage catheter in pelvis with minimal residual fluid at the previously identified large pelvic abscess which was drained. Pigtail drainage catheter within the mesentery in the mid abdomen with minimal residual fluid within the previously seen abscess collection. This residual fluid extends to a second persistent collection of fluid within the mesentery adjacent to a small bowel loop, consistent with residual abscess, 4.4 x 3.5 cm previously 5.1 x 3.6 cm. Additional large gas and minimal fluid containing collection in the RIGHT pelvis medial and inferior to the cecum measures 8.2 x 5.0 x 6.8 cm previously 8.2 x 5.9 x 8.0 cm. No new abscess collections. No free intraperitoneal air. Stranding of tissue planes in the pelvis again identified with minimal fluid at the LEFT pericolic gutter and perisplenic. Musculoskeletal: No acute osseous findings. IMPRESSION: Interval resolution of large pelvic abscess collection post drainage. Minimal residual mesenteric abscess  collection post drainage though the minimal residual fluid appears to communicate with a second collection in the mesentery in the LEFT mid abdomen adjacent to a small bowel loop measuring 4.4 x 3.5 cm slightly decreased since previous exam. Persistent large extraluminal gas collection in the RIGHT pelvis 8.2 x 5.0 x 6.8 cm, only slightly decreased. Diffuse colonic diverticulosis with persistent wall thickening of the sigmoid colon. Electronically Signed   By: Lavonia Dana M.D.   On: 04/22/2018 13:05   Ct Abdomen Pelvis Wo Contrast  Result Date: 04/15/2018 CLINICAL DATA:  Acute lower abdominal pain. EXAM: CT ABDOMEN AND PELVIS WITHOUT CONTRAST TECHNIQUE: Multidetector CT imaging of the abdomen and pelvis was performed following the standard protocol without IV contrast. COMPARISON:  CT scan of April 06, 2018. FINDINGS: Lower chest: No acute abnormality. Hepatobiliary: No focal liver abnormality is seen. Status post cholecystectomy. No biliary dilatation. Pancreas: Unremarkable. No pancreatic ductal dilatation or surrounding inflammatory changes. Spleen: Normal in size without focal abnormality. Adrenals/Urinary Tract: Adrenal glands appear normal. Small nonobstructive right renal calculus is noted. No hydronephrosis or renal obstruction is noted. Urinary bladder is unremarkable. Stomach/Bowel: The stomach and appendix are unremarkable. Sigmoid diverticulosis is noted. Some degree of residual diverticulitis cannot be excluded. However, 7.3 x 5.8 cm fluid collection is noted in left lower quadrant, which communicates with smaller fluid collection measuring 5.1 x 3.6 cm. This is concerning for abscess status post previous perforated diverticulitis. Air-fluid collection measuring 8.2 x 5.9 cm is seen in the right lower quadrant also concerning for possible abscess. 8.9 x 5.3 cm fluid and stool collection is seen posterior to the uterus and anterior to the rectum concerning for abscess or contained perforation.  Vascular/Lymphatic: Aortic atherosclerosis. No enlarged abdominal or pelvic lymph nodes. Reproductive: Uterus and bilateral adnexa are unremarkable. Other: Stable 6.9 x 4.5 cm fluid collection in left inguinal region. This most likely is benign. Musculoskeletal: No acute or significant osseous findings. IMPRESSION: There are at least 3 fluid collections and probable abscesses seen in  the lower abdomen and pelvis, following previously perforated diverticulitis. 7.3 x 5.8 cm fluid collection is noted in left lower quadrant, as well as air-fluid collection measuring 8.2 x 5.9 cm seen in the right lower quadrant. Also noted is 8.9 x 5.3 cm fluid collection in the pelvis in the pre rectal space which appears to contain stool and fluid. These results will be called to the ordering clinician or representative by the Radiologist Assistant, and communication documented in the PACS or zVision Dashboard. Small nonobstructive right renal calculus. Aortic Atherosclerosis (ICD10-I70.0). Electronically Signed   By: Marijo Conception, M.D.   On: 04/15/2018 14:10   Ct Abdomen Pelvis Wo Contrast  Result Date: 04/06/2018 CLINICAL DATA:  72 year old female with concern for perforated diverticulitis. EXAM: CT ABDOMEN AND PELVIS WITHOUT CONTRAST TECHNIQUE: Multidetector CT imaging of the abdomen and pelvis was performed following the standard protocol without IV contrast. COMPARISON:  Chest radiograph dated 04/06/2018 and CT dated 04/05/2018 FINDINGS: Evaluation of this exam is limited in the absence of intravenous contrast. Lower chest: Diffuse interstitial coarsening and streaky atelectasis/scarring of the lung bases. There is mild cardiomegaly. There is hypoattenuation of the cardiac blood pool suggestive of a degree of anemia. Clinical correlation is recommended. There is pneumoperitoneum and small amount of free fluid within the pelvis. Hepatobiliary: Advanced fatty infiltration of the liver. No intrahepatic biliary ductal  dilatation. Cholecystectomy. Pancreas: Unremarkable. No pancreatic ductal dilatation or surrounding inflammatory changes. Spleen: Normal in size without focal abnormality. Adrenals/Urinary Tract: The adrenal glands are unremarkable. There is no hydronephrosis on either side. Excreted contrast in the renal collecting systems bilaterally. The visualized ureters appear unremarkable. There is trabeculated appearance of the bladder wall likely related to chronic bladder dysfunction. Correlation with urinalysis recommended to exclude cystitis. Stomach/Bowel: There is extensive sigmoid diverticulosis. There is associated inflammatory changes and perforation of the sigmoid colon. There is increased size of the extraluminal air since the prior CT. No drainable fluid collection is noted at this time. There is no bowel obstruction. Normal appendix. Vascular/Lymphatic: There is advanced aortoiliac atherosclerotic disease. No portal venous gas. There is no adenopathy. Reproductive: The uterus is grossly unremarkable. Other: None Musculoskeletal: Degenerative changes of the spine. No acute osseous pathology. IMPRESSION: 1. Perforated sigmoid diverticulitis with increased size of the extraluminal air since the prior CT. No drainable fluid collection noted at this time. 2. No bowel obstruction. Normal appendix. 3. Advanced fatty infiltration of the liver. These results were called by telephone at the time of interpretation on 04/06/2018 at 11:09 pm to nurse Hearn, who verbally acknowledged these results. Electronically Signed   By: Anner Crete M.D.   On: 04/06/2018 23:25   Dg Chest 2 View  Result Date: 04/06/2018 CLINICAL DATA:  Dyspnea, sepsis, pt listless and unable to cooperate EXAM: CHEST - 2 VIEW COMPARISON:  06/27/2016 and 06/18/2016. FINDINGS: On the lateral view, there is evidence of a small amount free intraperitoneal air under the left hemidiaphragm. Lung volumes are low. There is opacity at the bases that is  likely due to atelectasis along with bronchovascular crowding. No convincing pneumonia and no evidence of pulmonary edema. 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 Cardiac silhouette is normal in size. No mediastinal hilar masses. No pleural effusion.  No pneumothorax. Skeletal structures are grossly intact. IMPRESSION: 1. Small amount of free intraperitoneal air. Recommend follow-up abdomen and pelvis CT with contrast for further assessment. 2. Somewhat limited study due to low lung volumes. Allowing for this, no acute cardiopulmonary disease. Electronically Signed  By: Lajean Manes M.D.   On: 04/06/2018 11:23   Ct Abdomen Pelvis W Contrast  Result Date: 04/05/2018 CLINICAL DATA:  Patient has bilateral lower abdominal sharp intermittent pain with nausea without vomiting. H/x diverticulitis patient states pain and symptoms similar to past. EXAM: CT ABDOMEN AND PELVIS WITH CONTRAST TECHNIQUE: Multidetector CT imaging of the abdomen and pelvis was performed using the standard protocol following bolus administration of intravenous contrast. CONTRAST:  27m ISOVUE-300 IOPAMIDOL (ISOVUE-300) INJECTION 61% COMPARISON:  02/06/2018 FINDINGS: Lower chest: There is scarring in both lung bases. Coronary artery calcifications are present. The heart is normal in size. Hepatobiliary: Liver is diffusely low attenuation. No focal liver lesions. Cholecystectomy. Pancreas: Unremarkable. No pancreatic ductal dilatation or surrounding inflammatory changes. Spleen: Normal in size without focal abnormality. Adrenals/Urinary Tract: Adrenal glands are normal. There is symmetric enhancement and excretion from the kidneys. 1 millimeter intrarenal calcification identified in the LOWER pole of the RIGHT kidney, not associated with obstruction. Ureters are unremarkable. The bladder and visualized portion of the urethra are normal. Stomach/Bowel: The stomach is normal in appearance. Small bowel loops are normal in caliber and wall  thickness. The appendix is well seen and has a normal appearance. There is significant inflammatory change in the region of sigmoid colon and associated numerous diverticula in this segment. Fluid extends in the sigmoid mesentery. The diseased segment of sigmoid is adjacent to the uterus and the urinary bladder. No evidence for abscess or perforation. Vascular/Lymphatic: There is atherosclerotic calcification of the abdominal aorta. There is normal vascular opacification of the celiac axis, superior mesenteric artery, and inferior mesenteric artery. Normal appearance of the portal venous system and inferior vena cava. Reproductive: The uterus is present and is surrounded by inflammatory changes from the sigmoid diverticulitis. No adnexal mass. Other: Trace amount of free pelvic fluid. Musculoskeletal: Incompletely imaged fluid attenuation collection within the LEFT hip abductors, measuring at least 7.3 x 4.8 centimeters. The appearance is stable since 2017. There are degenerative changes in the lumbar spine with 7 millimeters anterolisthesis of L4 on L5. Median sternotomy. IMPRESSION: 1. Acute sigmoid diverticulitis with significant fluid in the sigmoid mesentery. There is no evidence for perforation or abscess. 2. Coronary artery calcifications. 3. Hepatic steatosis.  Cholecystectomy. 4. Nonobstructing intrarenal calculus in the LOWER pole the RIGHT kidney. 5. Normal appendix. 6.  Aortic atherosclerosis. 7. Probably benign ganglion cyst within the LEFT hip abductors. Electronically Signed   By: ENolon NationsM.D.   On: 04/05/2018 22:06   UKoreaVenous Img Upper Uni Right  Result Date: 04/15/2018 CLINICAL DATA:  None EXAM: RIGHT UPPER EXTREMITY VENOUS DOPPLER ULTRASOUND TECHNIQUE: Gray-scale sonography with graded compression, as well as color Doppler and duplex ultrasound were performed to evaluate the upper extremity deep venous system from the level of the subclavian vein and including the jugular, axillary,  basilic, radial, ulnar and upper cephalic vein. Spectral Doppler was utilized to evaluate flow at rest and with distal augmentation maneuvers. COMPARISON:  None. FINDINGS: Contralateral Subclavian Vein: Respiratory phasicity is normal and symmetric with the symptomatic side. No evidence of thrombus. Normal compressibility. Internal Jugular Vein: No evidence of thrombus. Normal compressibility, respiratory phasicity and response to augmentation. Subclavian Vein: No evidence of thrombus. Normal compressibility, respiratory phasicity and response to augmentation. Axillary Vein: No evidence of thrombus. Normal compressibility, respiratory phasicity and response to augmentation. Cephalic Vein: No evidence of thrombus. Normal compressibility, respiratory phasicity and response to augmentation. Basilic Vein: No evidence of thrombus. Normal compressibility, respiratory phasicity and response to augmentation.  Brachial Veins: Occlusive thrombus of the right brachial vein, paired brachial veins. Radial Veins: No evidence of thrombus. Normal compressibility, respiratory phasicity and response to augmentation. Ulnar Veins: No evidence of thrombus. Normal compressibility, respiratory phasicity and response to augmentation. Other Findings:  Edema IMPRESSION: Sonographic survey of the right upper extremity is positive for occlusive DVT of the paired right brachial vein. Electronically Signed   By: Corrie Mckusick D.O.   On: 04/15/2018 14:17   Dg Chest Port 1 View  Result Date: 04/11/2018 CLINICAL DATA:  PICC placement. EXAM: PORTABLE CHEST 1 VIEW COMPARISON:  Chest radiograph 04/06/2018. FINDINGS: Cardiomegaly.  Mild vascular congestion.  Prior CABG. RIGHT arm PICC line tip appears to lie distal SVC. This is marked with an arrow. No pneumothorax. IMPRESSION: RIGHT arm PICC line tip appears to lie distal SVC. No pneumothorax. Cardiomegaly with mild vascular congestion, stable from priors. Electronically Signed   By: Staci Righter  M.D.   On: 04/11/2018 09:34   Ct Image Guided Fluid Drain By Catheter  Result Date: 04/16/2018 INDICATION: Pelvic abscess x2 EXAM: PELVIC ABSCESS DRAIN X2 CT-GUIDED MEDICATIONS: The patient is currently admitted to the hospital and receiving intravenous antibiotics. The antibiotics were administered within an appropriate time frame prior to the initiation of the procedure. ANESTHESIA/SEDATION: Fentanyl 0 mcg IV; Versed 4 mg IV, 4 mg morphine sulfate Moderate Sedation Time:  53 minutes The patient was continuously monitored during the procedure by the interventional radiology nurse under my direct supervision. COMPLICATIONS: None immediate. PROCEDURE: Informed written consent was obtained from the patient after a thorough discussion of the procedural risks, benefits and alternatives. All questions were addressed. Maximal Sterile Barrier Technique was utilized including caps, mask, sterile gowns, sterile gloves, sterile drape, hand hygiene and skin antiseptic. A timeout was performed prior to the initiation of the procedure. In the prone position, the right gluteal region was prepped and draped in a sterile fashion. Under CT guidance, an 18 gauge needle was advanced into the pelvic abscess and removed over an Amplatz wire. Ten [place] dilator followed by a 10 [place] drain were inserted in the fluid collection. Pus was aspirated. The patient was placed supine. The lower abdomen was prepped and draped in a sterile fashion. 1% lidocaine was utilized for local anesthesia. Under CT guidance, an 18 gauge needle was inserted into the upper pelvic fluid collection and removed over an Amplatz wire. Ten [place] dilator followed by 10 [place] drain were inserted. Cloudy yellow fluid was aspirated. FINDINGS: Images demonstrate 63 French drain placement into the 2 fluid collections described above. IMPRESSION: Successful pelvic abscess drain x2 Electronically Signed   By: Marybelle Killings M.D.   On: 04/16/2018 16:01   [place] Ekg Site  Rite  Result Date: 04/23/2018 If Site Rite image not attached, placement could not be confirmed due to current cardiac rhythm.    CBC Recent Labs  Lab 04/19/18 0419 04/20/18 0504 04/21/18 0406 04/22/18 0359 04/23/18 0329  WBC 7.2 7.4 7.8 7.7 9.1  HGB 9.0* 8.6* 8.7* 9.0* 9.0*  HCT 27.1* 26.2* 26.1* 26.9* 28.2*  PLT 239 292 334 358 405*  MCV 89.1 89.4 88.5 89.4 91.3  MCH 29.6 29.4 29.5 29.9 29.1  MCHC 33.2 32.8 33.3 33.5 31.9  RDW 16.4* 16.0* 16.2* 16.3* 16.5*    Chemistries  Recent Labs  Lab 04/18/18 0309 04/19/18 0419 04/21/18 0406 04/22/18 0359 04/23/18 0329  NA 138 138 138 139 138  K 3.1* 3.5 3.3* 3.3* 3.6  CL 99 100 97* 97* 94*  CO2 _0 34* 33*  GLUCOSE 106* 96 91 109* 97  BUN _1 CREATININE 1.09* 1.07* 1.31* 1.02* 1.11*  CALCIUM 7.3* 7.4* 7.6* 7.7* 7.9*  MG 0.8* 1.5*  --   --   --    ------------------------------------------------------------------------------------------------------------------ No results for input(s): CHOL, HDL, LDLCALC, TRIG, CHOLHDL, LDLDIRECT in the last 72 hours.  Lab Results  Component Value Date   HGBA1C 5.9 (H) 04/07/2018   ------------------------------------------------------------------------------------------------------------------ No results for input(s): TSH, T4TOTAL, T3FREE, THYROIDAB in the last 72 hours.  Invalid input(s): FREET3 ------------------------------------------------------------------------------------------------------------------ No results for input(s): VITAMINB12, FOLATE, FERRITIN, TIBC, IRON, RETICCTPCT in the last 72 hours.  Coagulation profile Recent Labs  Lab 04/23/18 0329  INR 1.25    No results for input(s): DDIMER in the last 72 hours.  Cardiac Enzymes No results for input(s): CKMB, TROPONINI, MYOGLOBIN in the last 168 hours.  Invalid input(s): CK ------------------------------------------------------------------------------------------------------------------      Component Value Date/Time   BNP 34.0 06/14/2014 1415    Dajae Kizer MD [facility] pgr [phone] 11/03/2017, 9:25 AM  [facility] - Office  [phone]

## 2018-04-23 NOTE — Progress Notes (Signed)
[facility] Progress Note     Subjective: CC-  Feeling about the same as yesterday. Main complaint is still back pain. She did not try the muscle relaxer yesterday. Would not get OOB with PT. Also continues to complain of abdominal pain and suppressed appetite. She did not drink any Ensure yesterday. Drain output recorded as 700cc yesterday, no one knows if this is accurate or not.  CT scan yesterday showed resolution of large pelvic abscess, minimal residual mesenteric abscess although this fluid appears to communicate with a second collection in the mesentery in the LEFT mid abdomen adjacent to a small bowel loop measuring 4.4 x 3.5 cm, and persistent large extraluminal gas collection in the RIGHT pelvis 8.2 x 5.0 x 6.8 cm. Discussed with IR who plans to place another drain.  Objective: Vital signs in last 24 hours: Temp:  [98.1 F (36.7 C)-99.6 F (37.6 C)] 98.1 F (36.7 C) (11/13 0444) Pulse Rate:  [83-87] 83 (11/13 0444) Resp:  [16-20] 16 (11/13 0444) BP: (100-140)/(51-77) 123/61 (11/13 0444) SpO2:  [98 %] 98 % (11/13 0444) Weight:  [118.2 kg] 118.2 kg (11/13 0500) Last BM Date: 04/22/18  Intake/Output from previous day: 11/12 0701 - 11/13 0700 In: 515 [P.O.:500; I.V.:5] Out: 2700 [Urine:2000; Drains:700] Intake/Output this shift: No intake/output data recorded.  PE: Gen: Alert, NAD HEENT: EOM's intact, pupils equal and round Pulm: effort normal ZOX:WRUEA, soft, +BS, no HSM,drain x2 with purulent drainage, mild diffuse tenderness/most significant in LLQ, no peritonitis Psych: A&Ox3 Skin: no rashes noted, warm and dry   Lab Results:  Recent Labs    04/22/18 0359 04/23/18 0329  WBC 7.7 9.1  HGB 9.0* 9.0*  HCT 26.9* 28.2*  PLT 358 405*   BMET Recent Labs    04/22/18 0359 04/23/18 0329  NA 139 138  K 3.3* 3.6  CL 97* 94*  CO2 34* 33*  GLUCOSE 109* 97  BUN 17 17  CREATININE 1.02* 1.11*  CALCIUM 7.7* 7.9*   PT/INR Recent Labs     04/23/18 0329  LABPROT 15.6*  INR 1.25   CMP     Component Value Date/Time   NA 138 04/23/2018 0329   K 3.6 04/23/2018 0329   CL 94 (L) 04/23/2018 0329   CO2 33 (H) 04/23/2018 0329   GLUCOSE 97 04/23/2018 0329   BUN 17 04/23/2018 0329   CREATININE 1.11 (H) 04/23/2018 0329   CALCIUM 7.9 (L) 04/23/2018 0329   PROT 5.6 (L) 04/15/2018 0859   ALBUMIN 2.1 (L) 04/15/2018 0859   AST 69 (H) 04/15/2018 0859   ALT 43 04/15/2018 0859   ALKPHOS 63 04/15/2018 0859   BILITOT 1.0 04/15/2018 0859   GFRNONAA 48 (L) 04/23/2018 0329   GFRAA 56 (L) 04/23/2018 0329   Lipase  No results found for: LIPASE     Studies/Results: Ct Abdomen Pelvis Wo Contrast  Result Date: 04/22/2018 CLINICAL DATA:  Follow-up diverticulitis EXAM: CT ABDOMEN AND PELVIS WITHOUT CONTRAST TECHNIQUE: Multidetector CT imaging of the abdomen and pelvis was performed following the standard protocol without IV contrast. Sagittal and coronal MPR images reconstructed from axial data set. Patient drank dilute oral contrast for exam COMPARISON:  04/15/2018 FINDINGS: Lower chest: Bibasilar atelectasis and tiny pleural effusions. Calcified granulomata at lung bases. Hepatobiliary: Gallbladder surgically absent. No focal hepatic abnormalities. Pancreas: Normal appearance Spleen: Normal appearance Adrenals/Urinary Tract: Adrenal glands normal appearance. Tiny nonobstructing calculus at inferior pole LEFT kidney. Kidneys, ureters, and bladder otherwise normal appearance. Stomach/Bowel: Stomach under distended with suboptimal assessment  of wall thickness. Diffuse diverticulosis of the transverse, descending, and sigmoid colon. Persistent wall thickening of the sigmoid colon. Wall thickening also identified at the cecum extending to the ileocecal valve, slightly increased since previous study. No evidence of bowel obstruction. Small bowel loops unremarkable. Vascular/Lymphatic: Atherosclerotic calcifications aorta and iliac arteries. No  adenopathy Reproductive: Uterus unremarkable.  Ovaries poorly visualized. Other: Pigtail drainage catheter in pelvis with minimal residual fluid at the previously identified large pelvic abscess which was drained. Pigtail drainage catheter within the mesentery in the mid abdomen with minimal residual fluid within the previously seen abscess collection. This residual fluid extends to a second persistent collection of fluid within the mesentery adjacent to a small bowel loop, consistent with residual abscess, 4.4 x 3.5 cm previously 5.1 x 3.6 cm. Additional large gas and minimal fluid containing collection in the RIGHT pelvis medial and inferior to the cecum measures 8.2 x 5.0 x 6.8 cm previously 8.2 x 5.9 x 8.0 cm. No new abscess collections. No free intraperitoneal air. Stranding of tissue planes in the pelvis again identified with minimal fluid at the LEFT pericolic gutter and perisplenic. Musculoskeletal: No acute osseous findings. IMPRESSION: Interval resolution of large pelvic abscess collection post drainage. Minimal residual mesenteric abscess collection post drainage though the minimal residual fluid appears to communicate with a second collection in the mesentery in the LEFT mid abdomen adjacent to a small bowel loop measuring 4.4 x 3.5 cm slightly decreased since previous exam. Persistent large extraluminal gas collection in the RIGHT pelvis 8.2 x 5.0 x 6.8 cm, only slightly decreased. Diffuse colonic diverticulosis with persistent wall thickening of the sigmoid colon. Electronically Signed   By: Lavonia Dana M.D.   On: 04/22/2018 13:05   [place] Ekg Site Rite  Result Date: 04/23/2018 If Site Rite image not attached, placement could not be confirmed due to current cardiac rhythm.   Anti-infectives: Anti-infectives (From admission, onward)   Start     Dose/Rate Route Frequency Ordered Stop   04/15/18 1700  meropenem (MERREM) 1 g in sodium chloride 0.9 % 100 mL IVPB     1 g 200 mL/hr over 30 Minutes  Intravenous Every 8 hours 04/15/18 1534     04/09/18 2200  meropenem (MERREM) 1 g in sodium chloride 0.9 % 100 mL IVPB  Status:  Discontinued     1 g 200 mL/hr over 30 Minutes Intravenous Every 8 hours 04/09/18 1244 04/14/18 0944   04/06/18 1300  meropenem (MERREM) 1 g in sodium chloride 0.9 % 100 mL IVPB  Status:  Discontinued     1 g 200 mL/hr over 30 Minutes Intravenous Every 12 hours 04/06/18 1145 04/09/18 1244   04/06/18 1145  meropenem (MERREM) 1 g in sodium chloride 0.9 % 100 mL IVPB  Status:  Discontinued     1 g 200 mL/hr over 30 Minutes Intravenous Every 8 hours 04/06/18 1139 04/06/18 1145   04/06/18 0900  ciprofloxacin (CIPRO) IVPB 400 mg  Status:  Discontinued     400 mg 200 mL/hr over 60 Minutes Intravenous Every 12 hours 04/06/18 0732 04/06/18 1139   04/06/18 0300  metroNIDAZOLE (FLAGYL) IVPB 500 mg  Status:  Discontinued     500 mg 100 mL/hr over 60 Minutes Intravenous Every 8 hours 04/05/18 2335 04/06/18 1139   04/05/18 1930  ciprofloxacin (CIPRO) IVPB 400 mg     400 mg 200 mL/hr over 60 Minutes Intravenous  Once 04/05/18 1925 04/05/18 2217   04/05/18 1930  metroNIDAZOLE (FLAGYL) IVPB 500  mg     500 mg 100 mL/hr over 60 Minutes Intravenous  Once 04/05/18 1925 04/05/18 2102       Assessment/Plan HTN HLD 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 - on plavix (last dose 10/27) H/o CVA  Morbidly obese Chronic pain RUE DVT - on IV heparin  Acute perforated sigmoid diverticulitis with multiple abscesses - CT scan 11/5 revealsat least 3 fluid collectionsin the lower abdomen/pelvis with previous perforated diverticulitis; fluid collections are7.3 x 5.8 cm in left lower quadrant, air-fluid collection measuring 8.2 x 5.9 cm in the right lower quadrant, and8.9 x 5.3 cm fluid collection in the pelvis in the pre rectal space which appears to contain stool and fluid -s/pIR perc drains x 2 - 04/16/18, culture with multiple organisms present/none predominant - CT scan 11/12  showed resolution of large pelvic abscess, minimal residual mesenteric abscess although this fluid appears to communicate with a second collection in the mesentery in the LEFT mid abdomen adjacent to a small bowel loop measuring 4.4 x 3.5 cm, and persistent large extraluminal gas collection in the RIGHT pelvis 8.2 x 5.0 x 6.8 cm.  ID -currently merrem 10/27>> VTE -SCDs, IV heparin FEN -IVF,NPO  Plan: IR drain. Continue current drains and IV antibiotics. Ok to resume diet after procedure. Patient has had inadequate nutrition since admission, will replace PICC and start TPN. Discussed importance of mobilization/OOB.   LOS: 82 days    Wellington Hampshire , [facility] Surgery 04/23/2018, 8:22 AM Pager: [phone] Mon 7:00 am -11:30 AM Tues-Fri 7:00 am-4:30 pm Sat-Sun 7:00 am-11:30 am

## 2018-04-23 NOTE — Progress Notes (Signed)
Peripherally Inserted Central Catheter/Midline Placement  The IV Nurse has discussed with the patient and/or persons authorized to consent for the patient, the purpose of this procedure and the potential benefits and risks involved with this procedure.  The benefits include less needle sticks, lab draws from the catheter, and the patient may be discharged home with the catheter. Risks include, but not limited to, infection, bleeding, blood clot (thrombus formation), and puncture of an artery; nerve damage and irregular heartbeat and possibility to perform a PICC exchange if needed/ordered by physician.  Alternatives to this procedure were also discussed.  Bard Power PICC patient education guide, fact sheet on infection prevention and patient information card has been provided to patient /or left at bedside.    PICC/Midline Placement Documentation        [name] 04/23/2018, 6:00 PM

## 2018-04-23 NOTE — Progress Notes (Signed)
Referring Physician(s): Dr Georgiann Cocker  Supervising Physician: Arne Cleveland  Patient Status:  [name] - In-pt  Chief Complaint:   Intra abd abscess drains x 2 placed in IR 11/6 Diverticular abscess   HPI:  Perforated sigmoid diverticulitis with multiple abscesses.  She is s/p abdominal abscess drains X2 by our service on 11/6.  Per patient and per chart, really not much output. She c/o pain at the TG drain site.  CT yesterday= Interval resolution of large pelvic abscess collection post drainage.  Minimal residual mesenteric abscess collection post drainage though the minimal residual fluid appears to communicate with a second collection in the mesentery in the LEFT mid abdomen adjacent to a small bowel loop measuring 4.4 x 3.5 cm slightly decreased since previous exam.  Persistent large extraluminal gas collection in the RIGHT pelvis 8.2 x 5.0 x 6.8 cm, only slightly decreased.  Diffuse colonic diverticulosis with persistent wall thickening of the sigmoid colon.  Allergies: Fentanyl; Lactose intolerance (gi); Butrans [buprenorphine]; Penicillins; and Simvastatin  Medications: Prior to Admission medications   Medication Sig Start Date End Date Taking? Authorizing Provider  Blood Glucose Monitoring Suppl (FREESTYLE LITE) DEVI See admin instructions. 12/22/14  Yes [provider]  ciprofloxacin (CIPRO) 500 MG/5ML (10%) suspension Take by mouth 2 (two) times daily.   Yes [provider]  clopidogrel (PLAVIX) 75 MG tablet Take 1 tablet (75 mg total) by mouth daily with breakfast. 01/14/18  Yes Rehman, Mechele Dawley, MD  Dexlansoprazole 30 MG capsule Take 40 mg by mouth daily.   Yes [provider]  diclofenac sodium (VOLTAREN) 1 % GEL Apply 2 g topically 4 (four) times daily as needed (Pain).    Yes [provider]  glipiZIDE (GLUCOTROL) 10 MG tablet Take 10 mg by mouth daily before breakfast.   Yes [provider]    HYDROcodone-acetaminophen (NORCO) 10-325 MG tablet Take 1 tablet by mouth 4 (four) times daily.   Yes [provider]  IRON PO Take 1 tablet by mouth daily.   Yes [provider]  isosorbide dinitrate (ISORDIL) 5 MG tablet Take 1 tablet (5 mg total) by mouth 3 (three) times daily before meals. 02/07/18  Yes Rehman, Mechele Dawley, MD  leflunomide (ARAVA) 20 MG tablet Take 20 mg by mouth daily.   Yes [provider]  metoprolol succinate (TOPROL-XL) 25 MG 24 hr tablet Take 25 mg by mouth daily.   Yes [provider]  metroNIDAZOLE (FLAGYL) 250 MG tablet Take 250 mg by mouth 3 (three) times daily.   Yes [provider]  Olmesartan-amLODIPine-HCTZ (TRIBENZOR) 40-5-25 MG TABS Take 1 tablet by mouth daily.    Yes [provider]  pravastatin (PRAVACHOL) 40 MG tablet Take 40 mg by mouth at bedtime.   Yes [provider]  predniSONE (DELTASONE) 5 MG tablet Take 5 mg by mouth daily with breakfast.   Yes [provider]  pregabalin (LYRICA) 75 MG capsule Take 75 mg by mouth 3 (three) times daily.   Yes [provider]  Tafluprost (ZIOPTAN) 0.0015 % SOLN Place 1 drop into both eyes at bedtime.    Yes [provider]  temazepam (RESTORIL) 30 MG capsule Take 30 mg by mouth at bedtime.   Yes [provider]  timolol (BETIMOL) 0.5 % ophthalmic solution Place 1 drop into both eyes 2 (two) times daily.    Yes [provider]  Tofacitinib Citrate (XELJANZ) 5 MG TABS Take 1 tablet by mouth daily.   Yes  [provider]  VITAMIN D, ERGOCALCIFEROL, PO Take 50,000 Units by mouth once a week. Thursdays   Yes [provider]  docusate sodium (COLACE) 100 MG capsule Take 2 capsules (200 mg total) by mouth at bedtime. Patient taking differently: Take 200 mg by mouth as needed.  02/13/18   Rogene Houston, MD     Vital Signs: BP 123/61 (BP Location: Left Leg)   Pulse 83   Temp 98.1 F (36.7 C) (Oral)    Resp 16   Ht 5' 5.5" (1.664 m)   Wt 118.2 kg   SpO2 98%   BMI 42.70 kg/m   Physical Exam Awake In bed NAD Abdomen soft but diffusely tender TG drain in place. ~700 mL output recorded (doubt this is accurate, could have been documented in the wrong place).  Imaging: Ct Abdomen Pelvis Wo Contrast  Result Date: 04/22/2018 CLINICAL DATA:  Follow-up diverticulitis EXAM: CT ABDOMEN AND PELVIS WITHOUT CONTRAST TECHNIQUE: Multidetector CT imaging of the abdomen and pelvis was performed following the standard protocol without IV contrast. Sagittal and coronal MPR images reconstructed from axial data set. Patient drank dilute oral contrast for exam COMPARISON:  04/15/2018 FINDINGS: Lower chest: Bibasilar atelectasis and tiny pleural effusions. Calcified granulomata at lung bases. Hepatobiliary: Gallbladder surgically absent. No focal hepatic abnormalities. Pancreas: Normal appearance Spleen: Normal appearance Adrenals/Urinary Tract: Adrenal glands normal appearance. Tiny nonobstructing calculus at inferior pole LEFT kidney. Kidneys, ureters, and bladder otherwise normal appearance. Stomach/Bowel: Stomach under distended with suboptimal assessment of wall thickness. Diffuse diverticulosis of the transverse, descending, and sigmoid colon. Persistent wall thickening of the sigmoid colon. Wall thickening also identified at the cecum extending to the ileocecal valve, slightly increased since previous study. No evidence of bowel obstruction. Small bowel loops unremarkable. Vascular/Lymphatic: Atherosclerotic calcifications aorta and iliac arteries. No adenopathy Reproductive: Uterus unremarkable.  Ovaries poorly visualized. Other: Pigtail drainage catheter in pelvis with minimal residual fluid at the previously identified large pelvic abscess which was drained. Pigtail drainage catheter within the mesentery in the mid abdomen with minimal residual fluid within the previously seen abscess collection. This  residual fluid extends to a second persistent collection of fluid within the mesentery adjacent to a small bowel loop, consistent with residual abscess, 4.4 x 3.5 cm previously 5.1 x 3.6 cm. Additional large gas and minimal fluid containing collection in the RIGHT pelvis medial and inferior to the cecum measures 8.2 x 5.0 x 6.8 cm previously 8.2 x 5.9 x 8.0 cm. No new abscess collections. No free intraperitoneal air. Stranding of tissue planes in the pelvis again identified with minimal fluid at the LEFT pericolic gutter and perisplenic. Musculoskeletal: No acute osseous findings. IMPRESSION: Interval resolution of large pelvic abscess collection post drainage. Minimal residual mesenteric abscess collection post drainage though the minimal residual fluid appears to communicate with a second collection in the mesentery in the LEFT mid abdomen adjacent to a small bowel loop measuring 4.4 x 3.5 cm slightly decreased since previous exam. Persistent large extraluminal gas collection in the RIGHT pelvis 8.2 x 5.0 x 6.8 cm, only slightly decreased. Diffuse colonic diverticulosis with persistent wall thickening of the sigmoid colon. Electronically Signed   By: Lavonia Dana M.D.   On: 04/22/2018 13:05    Labs:  CBC: Recent Labs    04/20/18 0504 04/21/18 0406 04/22/18 0359 04/23/18 0329  WBC 7.4 7.8 7.7 9.1  HGB 8.6* 8.7* 9.0* 9.0*  HCT 26.2* 26.1* 26.9* 28.2*  PLT 292 334 358 405*    COAGS:  Recent Labs    04/06/18 1952 04/23/18 0329  INR 1.22 1.25  APTT 33  --     BMP: Recent Labs    04/19/18 0419 04/21/18 0406 04/22/18 0359 04/23/18 0329  NA 138 138 139 138  K 3.5 3.3* 3.3* 3.6  CL 100 97* 97* 94*  CO2 30 31 34* 33*  GLUCOSE 96 91 109* 97  BUN 20 20 17 17   CALCIUM 7.4* 7.6* 7.7* 7.9*  CREATININE 1.07* 1.31* 1.02* 1.11*  GFRNONAA 50* 39* 53* 48*  GFRAA 58* 46* >60 56*    LIVER FUNCTION TESTS: Recent Labs    04/08/18 0415 04/09/18 0355 04/10/18 0351 04/15/18 0859  BILITOT  0.6 0.5 0.7 1.0  AST 46* 54* 41 69*  ALT 33 36 30 43  ALKPHOS 48 55 51 63  PROT 5.6* 5.2* 5.5* 5.6*  ALBUMIN 2.3* 2.2* 2.2* 2.1*    Assessment and Plan:   Persistent large extraluminal gas collection in the RIGHT pelvis 8.2 x 5.0 x 6.8 cm, only slightly decreased.  Will proceed with additional drain placement today by Dr. Vernard Gambles.  Risks and benefits discussed with the patient including bleeding, infection, damage to adjacent structures, bowel perforation/fistula connection, and sepsis.  All of the patient's questions were answered, patient is agreeable to proceed. Consent signed and in chart.   Electronically Signed: Murrell Redden, PA-C 04/23/2018, 7:56 AM    I spent a total of 15 Minutes at the the patient's bedside AND on the patient's hospital floor or unit, greater than 50% of which was counseling/coordinating care for abd drain placement.

## 2018-04-23 NOTE — Progress Notes (Signed)
PHARMACY - ADULT TOTAL PARENTERAL NUTRITION CONSULT NOTE   Pharmacy Consult for TPN Indication: perforated diverticulitis  Patient Measurements: Height: 5' 5.5" (166.4 cm) Weight: 260 lb 9.3 oz (118.2 kg) IBW/kg (Calculated) : 58.15 TPN AdjBW (KG): 75.1 Body mass index is 42.7 kg/m. Usual Weight:    Assessment: Admitted 10/26 with acute sigmoid diverticulitis without perf or abscess.   PMH: HTN, HLD, CKD3, RA, DM, CAD, CVA, CRI, fibromyalgia,morbid obesity, chronic pain, RUE DVT  GI: Acute perforated sigmoid diverticulitis with multiple abscesses. Patient has continued to have inadequate oral intake since admission. Clear liquid diet. Po PPI. 514ml po intake noted. 11/5: at least 3 fluid collectionsin the lower abdomen/pelvis with previous perforated diverticulitis; 11/6: Perc drains x 2 11/12 CT: resolution of large pelvic abscess, minimal residual mesenteric abscess although this fluid appears to communicate with a second collection in the mesentery in the LEFT mid abdomen adjacent to a small bowel loop, and persistent large extraluminal gas collection in the RIGHT pelvis-place additional drain  Endo: A1C 5.9. (glipizide PTA)  DM,  Insulin requirements in the past 24 hours:  Lytes: Last Mg 11/9 low replaced. Other WNL Renal:CKD3. Scr 1.1 Pulm: [place] Cards: VSS on IV Lasix, metoprolol, Pravach Hepatobil: AST elevated 11/5 Neuro:RA, fibromyalgia, h/o CV. Lexapro, Lyrica JO:[id number] from perforated sigmoid diverticulitis on Merrem Anticoag: RUE PICC DVT on IV heparin  TPN Access: removed 11/6 for DVT,  new 04/23/18 TPN start date:04/23/18 Nutritional Goals (per RD recommendation on 04/21/18): [id number] Protein:  85-100 Fluid: 1.6-1.8L  Goal TPN rate is 80 ml/hr   Current Nutrition: Clear liquids, new TPN 11/13.   Plan:  New PICC ordered TPN at 10mL/hr. - This TPN (at goal) provides 96 g of protein, 326 g of dextrose, and 38.4 g of lipids which provides 1877 kCals per  day, meeting 100% of patient needs Electrolytes in TPN: standart Add MVI, trace elements SSI and adjust as needed Monitor TPN labs Mon/Thurs and TPN   [name], PharmD, [place] [facility] Pharmacist [phone] [name] 04/23/2018,9:38 AM

## 2018-04-23 NOTE — Progress Notes (Signed)
Patient back from IR. Alert and oriented, no complaints of pain at this moment. A new drain placed on RLQ; dressing clean, dry, intact. VS stable. Will continue to monitor.

## 2018-04-24 LAB — GLUCOSE, CAPILLARY
GLUCOSE-CAPILLARY: 103 mg/dL — AB (ref 70–99)
GLUCOSE-CAPILLARY: 135 mg/dL — AB (ref 70–99)
GLUCOSE-CAPILLARY: 135 mg/dL — AB (ref 70–99)
GLUCOSE-CAPILLARY: 115 mg/dL — AB (ref 70–99)
Glucose-Capillary: 126 mg/dL — ABNORMAL HIGH (ref 70–99)
Glucose-Capillary: 119 mg/dL — ABNORMAL HIGH (ref 70–99)

## 2018-04-24 LAB — DIFFERENTIAL
ABS IMMATURE GRANULOCYTES: 0.1 10*3/uL — AB (ref 0.00–0.07)
BASOS PCT: 1 %
Basophils Absolute: 0.1 10*3/uL (ref 0.0–0.1)
EOS ABS: 0.4 10*3/uL (ref 0.0–0.5)
Eosinophils Relative: 5 %
IMMATURE GRANULOCYTES: 1 %
Lymphocytes Relative: 5 %
Lymphs Abs: 0.4 10*3/uL — ABNORMAL LOW (ref 0.7–4.0)
MONOS PCT: 16 %
Monocytes Absolute: 1.4 10*3/uL — ABNORMAL HIGH (ref 0.1–1.0)
NEUTROS PCT: 72 %
Neutro Abs: 6.4 10*3/uL (ref 1.7–7.7)

## 2018-04-24 LAB — COMPREHENSIVE METABOLIC PANEL
ALT: 28 U/L (ref 0–44)
ANION GAP: 10 (ref 5–15)
AST: 55 U/L — AB (ref 15–41)
Albumin: 1.7 g/dL — ABNORMAL LOW (ref 3.5–5.0)
Alkaline Phosphatase: 59 U/L (ref 38–126)
BILIRUBIN TOTAL: 0.6 mg/dL (ref 0.3–1.2)
BUN: 20 mg/dL (ref 8–23)
CO2: 37 mmol/L — ABNORMAL HIGH (ref 22–32)
Calcium: 7.6 mg/dL — ABNORMAL LOW (ref 8.9–10.3)
Chloride: 92 mmol/L — ABNORMAL LOW (ref 98–111)
Creatinine, Ser: 1.07 mg/dL — ABNORMAL HIGH (ref 0.44–1.00)
GFR calc Af Amer: 58 mL/min — ABNORMAL LOW (ref >60)
GFR, EST NON AFRICAN AMERICAN: 50 mL/min — AB (ref >60)
Glucose, Bld: 142 mg/dL — ABNORMAL HIGH (ref 70–99)
POTASSIUM: 3.4 mmol/L — AB (ref 3.5–5.1)
Sodium: 139 mmol/L (ref 135–145)
TOTAL PROTEIN: 4.7 g/dL — AB (ref 6.5–8.1)

## 2018-04-24 LAB — PREALBUMIN: PREALBUMIN: 6.6 mg/dL — AB (ref 18–38)

## 2018-04-24 LAB — CBC
HCT: 26.4 % — ABNORMAL LOW (ref 36.0–46.0)
Hemoglobin: 8.5 g/dL — ABNORMAL LOW (ref 12.0–15.0)
MCH: 30.2 pg (ref 26.0–34.0)
MCHC: 32.2 g/dL (ref 30.0–36.0)
MCV: 94 fL (ref 80.0–100.0)
NRBC: 0.6 % — AB (ref 0.0–0.2)
PLATELETS: 352 10*3/uL (ref 150–400)
RBC: 2.81 MIL/uL — AB (ref 3.87–5.11)
RDW: 16.8 % — ABNORMAL HIGH (ref 11.5–15.5)
WBC: 8.8 10*3/uL (ref 4.0–10.5)

## 2018-04-24 LAB — HEPARIN LEVEL (UNFRACTIONATED): Heparin Unfractionated: 0.35 IU/mL (ref 0.30–0.70)

## 2018-04-24 LAB — PHOSPHORUS: PHOSPHORUS: 3.7 mg/dL (ref 2.5–4.6)

## 2018-04-24 LAB — MAGNESIUM: MAGNESIUM: 0.9 mg/dL — AB (ref 1.7–2.4)

## 2018-04-24 LAB — TRIGLYCERIDES: TRIGLYCERIDES: 113 mg/dL (ref <150)

## 2018-04-24 MED ORDER — MAGNESIUM SULFATE 4 GM/100ML IV SOLN
4.0000 g | Freq: Once | INTRAVENOUS | Status: AC
  Administered 2018-04-24: 4 g via INTRAVENOUS
  Filled 2018-04-24: qty 100

## 2018-04-24 MED ORDER — POTASSIUM CHLORIDE 10 MEQ/50ML IV SOLN
10.0000 meq | INTRAVENOUS | Status: AC
  Administered 2018-04-24 (×4): 10 meq via INTRAVENOUS
  Filled 2018-04-24 (×4): qty 50

## 2018-04-24 MED ORDER — ENOXAPARIN SODIUM 120 MG/0.8ML ~~LOC~~ SOLN
1.0000 mg/kg | Freq: Two times a day (BID) | SUBCUTANEOUS | Status: DC
  Administered 2018-04-24 – 2018-04-29 (×10): 120 mg via SUBCUTANEOUS
  Filled 2018-04-24 (×11): qty 0.79

## 2018-04-24 MED ORDER — TRAVASOL 10 % IV SOLN
INTRAVENOUS | Status: AC
  Administered 2018-04-24: 17:00:00 via INTRAVENOUS
  Filled 2018-04-24: qty 480

## 2018-04-24 NOTE — Progress Notes (Signed)
Physical Therapy Treatment Patient Details Name: [name] MRN: [id number] DOB: 1946/01/05 Today's Date: 04/24/2018    History of Present Illness [name]  is a 72 y.o. female, w hypertension, hyperlipidemia, dm2, pvd, CVA, OSA, Rheumatoid arthritis apparently c/o LLQ pain over the past several weeks, just started on abx yesterday, pt notes subjective fever as well as nausea.  Pt denies emesis, constipation, diarrhea, brbpr, black stool.  Pt presented due to worsening abdominal pain today. Found to have acute perforated sigmoid diverticulitis with multiple abscesses. Pt is s/p abdominal abscess drains X2.     PT Comments    Pt was in bed asleep upon arrival. Pt was easily aroused. She was hesitant, but agreed to therapy. Pt participated in bed mobility and standing with STEDY this session. She is limited by pain and anxiety. She had a BM upon standing from EOB and was transferred to [facility]. O2 dropped to 85% RA at end of session- Pt put on 2L of O2 and returned to 94%. Pt in chair at end of session with all needs met. Pt would benefit from continued skilled PT in order to progress toward stated goals and maximize function. Pt remains appropriate for d/c to SNF based on current level of mobility.   Follow Up Recommendations  SNF     Equipment Recommendations  None recommended by PT    Recommendations for Other Services       Precautions / Restrictions Precautions Precautions: Fall;Other (comment) Precaution Comments: 2 abscess drains Restrictions Weight Bearing Restrictions: No    Mobility  Bed Mobility Overal bed mobility: Needs Assistance Bed Mobility: Rolling Rolling: Mod assist   Supine to sit: Max assist;HOB elevated     General bed mobility comments: VC/TC for hand placement and assist to progress LE off EOB.   Transfers Overall transfer level: Needs assistance   Transfers: Sit to/from Stand Sit to Stand: +2 physical assistance;Mod assist          General transfer comment: Via STEDY, pt stood x3 in the STEDY  Used STEDY to transfer from bed to [facility] and BSC to chair.  Pt required assist to power up into standing and to keep her hips in extension.   Ambulation/Gait                 Stairs             Wheelchair Mobility    Modified Rankin (Stroke Patients Only)       Balance Overall balance assessment: Needs assistance Sitting-balance support: Feet supported;Bilateral upper extremity supported Sitting balance-Leahy Scale: Fair Sitting balance - Comments: pt required assist to scoot to square up at EOB. pt c/o of abdominal pain upon sitting- RN notified and pain meds administered to pt by RN.     Standing balance-Leahy Scale: Poor Standing balance comment:  Limited by diarrhea upon standing this session. pt stood in STEDY while pericare was performed. Pt became SOB during session- O2 dropped to 85% on RA. Pt was put back on 2L of oxygen. Pt instructed in pursed lip breathing.                            Cognition Arousal/Alertness: Awake/alert Behavior During Therapy: WFL for tasks assessed/performed Overall Cognitive Status: Within Functional Limits for tasks assessed  Exercises      General Comments        Pertinent Vitals/Pain Faces Pain Scale: Hurts little more Pain Location: back pain, L shoulder and stomach Pain Descriptors / Indicators: Sharp;Constant;Crying;Aching Pain Intervention(s): Limited activity within patient's tolerance;Monitored during session;Repositioned;Heat applied;RN gave pain meds during session    Home Living                      Prior Function            PT Goals (current goals can now be found in the care plan section) Acute Rehab PT Goals Patient Stated Goal: to decrease back pain  PT Goal Formulation: With patient/family Time For Goal Achievement: 04/28/18 Potential to Achieve Goals:  Fair Progress towards PT goals: Progressing toward goals    Frequency    Min 3X/week      PT Plan Current plan remains appropriate    Co-evaluation              AM-PAC PT "6 Clicks" Daily Activity  Outcome Measure  Difficulty turning over in bed (including adjusting bedclothes, sheets and blankets)?: Unable Difficulty moving from lying on back to sitting on the side of the bed? : Unable Difficulty sitting down on and standing up from a chair with arms (e.g., wheelchair, bedside commode, etc,.)?: Unable Help needed moving to and from a bed to chair (including a wheelchair)?: A Lot Help needed walking in hospital room?: Total Help needed climbing 3-5 steps with a railing? : Total 6 Click Score: 7    End of Session Equipment Utilized During Treatment: Gait belt Activity Tolerance: Patient limited by pain;Patient limited by fatigue Patient left: in chair;with call bell/phone within reach Nurse Communication: Mobility status;Patient requests pain meds PT Visit Diagnosis: Unsteadiness on feet (R26.81);Muscle weakness (generalized) (M62.81)     Time: 9741-6384 PT Time Calculation (min) (ACUTE ONLY): 37 min  Charges:  $Therapeutic Activity: 23-37 mins                     [address], SPTA   [place] 04/24/2018, 4:57 PM

## 2018-04-24 NOTE — Progress Notes (Signed)
Patient Demographics:    [name], is a 72 y.o. female, DOB - 28-Jun-1945, ONG:[id number]  Admit date - 04/05/2018   Admitting Physician [name], MD  Outpatient Primary MD for the patient is [name], MD  LOS - [phone]   Chief Complaint  Patient presents with  . Abdominal Pain      Brief narrative:  72yo female PMH HTN, DM-II, CKD, RA, H/o CAD s/p CABG (on plavix,), who was admitted to [facility] 10/26 with acute sigmoid diverticulitis without perforation or abscess. Initially felt to be improving on IV merrem, but she continued to have abdominal pain, CT abdomen pelvis was obtained 04/15/2018, was significant for worsening diverticulitis now with multiple abdominal fluid collections.  Patient developed acute right upper extremity DVT in the setting of PICC line, she was transferred from [facility] to [facility] 04/15/2018 for evaluation by IR.  She has drain placed by IR 04/16/2018 X2, repeat CT abdomen pelvis 04/23/2018, showing improvement of fluid collection at abscess and current drain, but significant right lower quadrant abdomen air/fluid collection, which she required a third drain to be placed 04/23/2018.   Subjective:    [name] denies any fever or chills overnight, no further diarrhea, reports abdominal pain at baseline, no significant events overnight.   Assessment  & P lan :    Principal Problem:   Diverticulitis Active Problems:   DM (diabetes mellitus) ([place])   HTN (hypertension)   Rheumatoid arthritis ([place])   Anemia   Renal insufficiency   Sepsis due to undetermined organism ([place])   CKD (chronic kidney disease) stage 3, GFR 30-59 ml/min (HCC)   Fibromyalgia   Diverticulitis large intestine w/o perforation or abscess w/o bleeding   Acute diverticulitis   Perforated sigmoid colon ([place])   Acute renal failure superimposed on stage 3 chronic kidney disease ([place])  On advisory Sepsis  due to acute perforated sigmoid diverticulitis- -sepsis criteria met on admission - imaging 04/06/2018 on admission significant for perforated sigmoid diverticulitis, was managed conservatively on IV meropenem, clear liquid diet, . -Repeat imaging 04/15/2018 with large abscesses in the abdomen, transferred to [facility] for further care, general surgery consult greatly appreciated. -IR consulted, they have placed to drainage in her mesenteric abscess, pelvic abscess, cultures were growing multiple organisms, none predominant, no anaerobes isolated, continue with meropenem. -Repeat CT abdomen pelvis today showing large pelvis abscess, as well 1 of the mesenteric abscesses has resolved, though communicating with another still present abscess , and still present large right abdomen gas/fluid area, discussed with general surgery, recommendation for IR to drain, he had third drain placed by IR 04/23/2018. -He remains with poor oral intake, so she was started on TPN.   Acute right upper extremity DVT -In the setting of PICC line insertion, plan discontinued 04/16/2018, can delete to acute right upper extremity DVT, initially on heparin, transition to Lovenox . -PICC line  discontinued 04/16/2018  acute kidney injury on CKD3. Baseline creat 1.2.   - AKI resolved w/ IVF but creat down to 0.7 now, appears to be volume overloaded, continue with diuresis as blood pressure tolerates.  Fibromyalgia -Discussed with the patient, and on her home dose Lyrica  Hypertension -Continue with metoprolol, given soft blood pressure continue to hold  home medication including as  isosorbide/ olmesartan/amlodipine/HCTZ combo -Patient remains on IV Lasix, still with some volume overload, will increase her Lasix to 40 twice daily   DM2 -   BS's are good, cont Novolog/Humalog Sliding scale insulin  H/o CAD sp CABG /hx TIA - previously on Plavix and pravastatin, continue to hold Plavix may need further procedures or  surgery  Debility -PT is consulted, SNF is recommended -Patient has not been very compliant with her physical therapy, the hard to convince to participate in PT, and sit on the chair, will continue to encourage with physical activity.   acute on chronic anemia--- sp 2 u prbc's,   Started on Lexapro for depression  Paroxysmal atrial tachycardia -She had episode of PAT 04/22/2018 overnight, sit for a few minutes, she had echo done hospital stay with a preserved EF, her beta-blocker has been increased, no recurrence since.    Code Status : full  Family communication: Husband at bedside  Disposition Plan  : TBD, will prob need SNF  Consults  :  Gen Surgery, IR  Procedures: -Right arm PICC line inserted on admission, discontinued 04/16/2018 due to DVT -Left arm PICC line inserted 04/23/2018 - s/pIR perc drains x 2 - 04/16/18, S/p third IR drain 11/13.   DVT Prophylaxis  :   SCDs, heparin GTT>>lovenox   Lab Results  Component Value Date   PLT 352 04/24/2018    Inpatient Medications  Scheduled Meds: . Chlorhexidine Gluconate Cloth  6 each Topical Daily  . enoxaparin (LOVENOX) injection  1 mg/kg Subcutaneous Q12H  . escitalopram  20 mg Oral QHS  . furosemide  40 mg Intravenous BID  . insulin aspart  0-15 Units Subcutaneous Q4H  . latanoprost  1 drop Both Eyes QHS  . metoprolol tartrate  37.5 mg Oral BID  . nystatin   Topical TID  . pantoprazole  40 mg Oral Daily  . pravastatin  40 mg Oral QHS  . pregabalin  50 mg Oral QHS  . sodium chloride flush  10-40 mL Intracatheter Q12H  . sodium chloride flush  5 mL Intracatheter Q8H  . sodium chloride flush  5 mL Intracatheter Q8H  . timolol  1 drop Both Eyes BID   Continuous Infusions: . meropenem (MERREM) IV 1 g (04/24/18 1056)  . potassium chloride 10 mEq (04/24/18 1341)  . TPN ADULT (ION) 40 mL/hr at 04/24/18 0700  . TPN ADULT (ION)     PRN Meds:.acetaminophen **OR** acetaminophen, albuterol, hydrALAZINE,  HYDROcodone-acetaminophen, methocarbamol, ondansetron (ZOFRAN) IV, oxyCODONE, senna-docusate, sodium chloride flush, sodium chloride flush, traMADol    Anti-infectives (From admission, onward)   Start     Dose/Rate Route Frequency Ordered Stop   04/15/18 1700  meropenem (MERREM) 1 g in sodium chloride 0.9 % 100 mL IVPB     1 g 200 mL/hr over 30 Minutes Intravenous Every 8 hours 04/15/18 1534     04/09/18 2200  meropenem (MERREM) 1 g in sodium chloride 0.9 % 100 mL IVPB  Status:  Discontinued     1 g 200 mL/hr over 30 Minutes Intravenous Every 8 hours 04/09/18 1244 04/14/18 0944   04/06/18 1300  meropenem (MERREM) 1 g in sodium chloride 0.9 % 100 mL IVPB  Status:  Discontinued     1 g 200 mL/hr over 30 Minutes Intravenous Every 12 hours 04/06/18 1145 04/09/18 1244   04/06/18 1145  meropenem (MERREM) 1 g in sodium chloride 0.9 % 100 mL IVPB  Status:  Discontinued  1 g 200 mL/hr over 30 Minutes Intravenous Every 8 hours 04/06/18 1139 04/06/18 1145   04/06/18 0900  ciprofloxacin (CIPRO) IVPB 400 mg  Status:  Discontinued     400 mg 200 mL/hr over 60 Minutes Intravenous Every 12 hours 04/06/18 0732 04/06/18 1139   04/06/18 0300  metroNIDAZOLE (FLAGYL) IVPB 500 mg  Status:  Discontinued     500 mg 100 mL/hr over 60 Minutes Intravenous Every 8 hours 04/05/18 2335 04/06/18 1139   04/05/18 1930  ciprofloxacin (CIPRO) IVPB 400 mg     400 mg 200 mL/hr over 60 Minutes Intravenous  Once 04/05/18 1925 04/05/18 2217   04/05/18 1930  metroNIDAZOLE (FLAGYL) IVPB 500 mg     500 mg 100 mL/hr over 60 Minutes Intravenous  Once 04/05/18 1925 04/05/18 2102        Objective:   Vitals:   04/23/18 2225 04/24/18 0500 04/24/18 0602 04/24/18 1413  BP: (!) 111/59  (!) 106/51 (!) 113/54  Pulse: 83  84 77  Resp:   14 18  Temp:   98.4 F (36.9 C) 98.2 F (36.8 C)  TempSrc:   Oral Oral  SpO2: 96%  96% 96%  Weight:  118.3 kg    Height:        Wt Readings from Last 3 Encounters:  04/24/18 118.3 kg    02/13/18 116.1 kg  01/13/18 120.7 kg     Intake/Output Summary (Last 24 hours) at 04/24/2018 1423 Last data filed at 04/24/2018 1347 Gross per 24 hour  Intake 1219.15 ml  Output 1110 ml  Net 109.15 ml     Physical Exam  Awake Alert, Oriented X 3, No new F.N deficits, Normal affect Symmetrical Chest wall movement, Good air movement bilaterally, CTAB RRR,No Gallops,Rubs or new Murmurs, No Parasternal Heave +ve B.Sounds, Abd Soft, abdomen with percutaneous drain, having some right lower quadrant tenderness, but no rebound or guarding No Cyanosis, Clubbing or edema, No new Rash or bruise         Data Review:   Micro Results Recent Results (from the past 240 hour(s))  Aerobic/Anaerobic Culture (surgical/deep wound)     Status: Abnormal   Collection Time: 04/16/18  3:13 PM  Result Value Ref Range Status   Specimen Description PELVIS ABSCESS  Final   Special Requests NONE  Final   Gram Stain   Final    FEW WBC PRESENT,BOTH PMN AND MONONUCLEAR FEW GRAM POSITIVE COCCI IN CLUSTERS FEW GRAM POSITIVE RODS    Culture (A)  Final    MULTIPLE ORGANISMS PRESENT, NONE PREDOMINANT NO ANAEROBES ISOLATED Performed at [facility], [place] [address], [place], [place] [address]    Report Status 04/22/2018 FINAL  Final  Aerobic/Anaerobic Culture (surgical/deep wound)     Status: None   Collection Time: 04/16/18  3:13 PM  Result Value Ref Range Status   Specimen Description ABSCESS PELVIS  Final   Special Requests SAMPLE NO 2  Final   Gram Stain   Final    FEW WBC PRESENT,BOTH PMN AND MONONUCLEAR NO ORGANISMS SEEN Performed at [facility], [address] [place], [place] [address]    Culture   Final    RARE CLOSTRIDIUM CLOSTRIDIOFORME RARE PEPTOSTREPTOCOCCUS ASACCHAROLYTICUS    Report Status 04/23/2018 FINAL  Final  Aerobic/Anaerobic Culture (surgical/deep wound)     Status: None (Preliminary result)   Collection Time: 04/23/18  3:20 PM  Result Value Ref Range Status    Specimen Description ABSCESS RIGHT LOWER QUADRANT  Final   Special Requests Normal  Final   Gram Stain NO WBC SEEN NO ORGANISMS SEEN   Final   Culture   Final    CULTURE REINCUBATED FOR BETTER GROWTH Performed at [facility], [address] [place], [place] [address]    Report Status PENDING  Incomplete    Radiology Reports Ct Abdomen Pelvis Wo Contrast  Result Date: 04/22/2018 CLINICAL DATA:  Follow-up diverticulitis EXAM: CT ABDOMEN AND PELVIS WITHOUT CONTRAST TECHNIQUE: Multidetector CT imaging of the abdomen and pelvis was performed following the standard protocol without IV contrast. Sagittal and coronal MPR images reconstructed from axial data set. Patient drank dilute oral contrast for exam COMPARISON:  04/15/2018 FINDINGS: Lower chest: Bibasilar atelectasis and tiny pleural effusions. Calcified granulomata at lung bases. Hepatobiliary: Gallbladder surgically absent. No focal hepatic abnormalities. Pancreas: Normal appearance Spleen: Normal appearance Adrenals/Urinary Tract: Adrenal glands normal appearance. Tiny nonobstructing calculus at inferior pole LEFT kidney. Kidneys, ureters, and bladder otherwise normal appearance. Stomach/Bowel: Stomach under distended with suboptimal assessment of wall thickness. Diffuse diverticulosis of the transverse, descending, and sigmoid colon. Persistent wall thickening of the sigmoid colon. Wall thickening also identified at the cecum extending to the ileocecal valve, slightly increased since previous study. No evidence of bowel obstruction. Small bowel loops unremarkable. Vascular/Lymphatic: Atherosclerotic calcifications aorta and iliac arteries. No adenopathy Reproductive: Uterus unremarkable.  Ovaries poorly visualized. Other: Pigtail drainage catheter in pelvis with minimal residual fluid at the previously identified large pelvic abscess which was drained. Pigtail drainage catheter within the mesentery in the mid abdomen with minimal  residual fluid within the previously seen abscess collection. This residual fluid extends to a second persistent collection of fluid within the mesentery adjacent to a small bowel loop, consistent with residual abscess, 4.4 x 3.5 cm previously 5.1 x 3.6 cm. Additional large gas and minimal fluid containing collection in the RIGHT pelvis medial and inferior to the cecum measures 8.2 x 5.0 x 6.8 cm previously 8.2 x 5.9 x 8.0 cm. No new abscess collections. No free intraperitoneal air. Stranding of tissue planes in the pelvis again identified with minimal fluid at the LEFT pericolic gutter and perisplenic. Musculoskeletal: No acute osseous findings. IMPRESSION: Interval resolution of large pelvic abscess collection post drainage. Minimal residual mesenteric abscess collection post drainage though the minimal residual fluid appears to communicate with a second collection in the mesentery in the LEFT mid abdomen adjacent to a small bowel loop measuring 4.4 x 3.5 cm slightly decreased since previous exam. Persistent large extraluminal gas collection in the RIGHT pelvis 8.2 x 5.0 x 6.8 cm, only slightly decreased. Diffuse colonic diverticulosis with persistent wall thickening of the sigmoid colon. Electronically Signed   By: Lavonia Dana M.D.   On: 04/22/2018 13:05   Ct Abdomen Pelvis Wo Contrast  Result Date: 04/15/2018 CLINICAL DATA:  Acute lower abdominal pain. EXAM: CT ABDOMEN AND PELVIS WITHOUT CONTRAST TECHNIQUE: Multidetector CT imaging of the abdomen and pelvis was performed following the standard protocol without IV contrast. COMPARISON:  CT scan of April 06, 2018. FINDINGS: Lower chest: No acute abnormality. Hepatobiliary: No focal liver abnormality is seen. Status post cholecystectomy. No biliary dilatation. Pancreas: Unremarkable. No pancreatic ductal dilatation or surrounding inflammatory changes. Spleen: Normal in size without focal abnormality. Adrenals/Urinary Tract: Adrenal glands appear normal. Small  nonobstructive right renal calculus is noted. No hydronephrosis or renal obstruction is noted. Urinary bladder is unremarkable. Stomach/Bowel: The stomach and appendix are unremarkable. Sigmoid diverticulosis is noted. Some degree of residual diverticulitis cannot  be excluded. However, 7.3 x 5.8 cm fluid collection is noted in left lower quadrant, which communicates with smaller fluid collection measuring 5.1 x 3.6 cm. This is concerning for abscess status post previous perforated diverticulitis. Air-fluid collection measuring 8.2 x 5.9 cm is seen in the right lower quadrant also concerning for possible abscess. 8.9 x 5.3 cm fluid and stool collection is seen posterior to the uterus and anterior to the rectum concerning for abscess or contained perforation. Vascular/Lymphatic: Aortic atherosclerosis. No enlarged abdominal or pelvic lymph nodes. Reproductive: Uterus and bilateral adnexa are unremarkable. Other: Stable 6.9 x 4.5 cm fluid collection in left inguinal region. This most likely is benign. Musculoskeletal: No acute or significant osseous findings. IMPRESSION: There are at least 3 fluid collections and probable abscesses seen in the lower abdomen and pelvis, following previously perforated diverticulitis. 7.3 x 5.8 cm fluid collection is noted in left lower quadrant, as well as air-fluid collection measuring 8.2 x 5.9 cm seen in the right lower quadrant. Also noted is 8.9 x 5.3 cm fluid collection in the pelvis in the pre rectal space which appears to contain stool and fluid. These results will be called to the ordering clinician or representative by the Radiologist Assistant, and communication documented in the PACS or zVision Dashboard. Small nonobstructive right renal calculus. Aortic Atherosclerosis (ICD10-I70.0). Electronically Signed   By: Marijo Conception, M.D.   On: 04/15/2018 14:10   Ct Abdomen Pelvis Wo Contrast  Result Date: 04/06/2018 CLINICAL DATA:  72 year old female with concern for  perforated diverticulitis. EXAM: CT ABDOMEN AND PELVIS WITHOUT CONTRAST TECHNIQUE: Multidetector CT imaging of the abdomen and pelvis was performed following the standard protocol without IV contrast. COMPARISON:  Chest radiograph dated 04/06/2018 and CT dated 04/05/2018 FINDINGS: Evaluation of this exam is limited in the absence of intravenous contrast. Lower chest: Diffuse interstitial coarsening and streaky atelectasis/scarring of the lung bases. There is mild cardiomegaly. There is hypoattenuation of the cardiac blood pool suggestive of a degree of anemia. Clinical correlation is recommended. There is pneumoperitoneum and small amount of free fluid within the pelvis. Hepatobiliary: Advanced fatty infiltration of the liver. No intrahepatic biliary ductal dilatation. Cholecystectomy. Pancreas: Unremarkable. No pancreatic ductal dilatation or surrounding inflammatory changes. Spleen: Normal in size without focal abnormality. Adrenals/Urinary Tract: The adrenal glands are unremarkable. There is no hydronephrosis on either side. Excreted contrast in the renal collecting systems bilaterally. The visualized ureters appear unremarkable. There is trabeculated appearance of the bladder wall likely related to chronic bladder dysfunction. Correlation with urinalysis recommended to exclude cystitis. Stomach/Bowel: There is extensive sigmoid diverticulosis. There is associated inflammatory changes and perforation of the sigmoid colon. There is increased size of the extraluminal air since the prior CT. No drainable fluid collection is noted at this time. There is no bowel obstruction. Normal appendix. Vascular/Lymphatic: There is advanced aortoiliac atherosclerotic disease. No portal venous gas. There is no adenopathy. Reproductive: The uterus is grossly unremarkable. Other: None Musculoskeletal: Degenerative changes of the spine. No acute osseous pathology. IMPRESSION: 1. Perforated sigmoid diverticulitis with increased size  of the extraluminal air since the prior CT. No drainable fluid collection noted at this time. 2. No bowel obstruction. Normal appendix. 3. Advanced fatty infiltration of the liver. These results were called by telephone at the time of interpretation on 04/06/2018 at 11:09 pm to nurse Hearn, who verbally acknowledged these results. Electronically Signed   By: Anner Crete M.D.   On: 04/06/2018 23:25   Dg Chest 2 View  Result Date: 04/06/2018  CLINICAL DATA:  Dyspnea, sepsis, pt listless and unable to cooperate EXAM: CHEST - 2 VIEW COMPARISON:  06/27/2016 and 06/18/2016. FINDINGS: On the lateral view, there is evidence of a small amount free intraperitoneal air under the left hemidiaphragm. Lung volumes are low. There is opacity at the bases that is likely due to atelectasis along with bronchovascular crowding. No convincing pneumonia and no evidence of pulmonary edema. 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 Cardiac silhouette is normal in size. No mediastinal hilar masses. No pleural effusion.  No pneumothorax. Skeletal structures are grossly intact. IMPRESSION: 1. Small amount of free intraperitoneal air. Recommend follow-up abdomen and pelvis CT with contrast for further assessment. 2. Somewhat limited study due to low lung volumes. Allowing for this, no acute cardiopulmonary disease. Electronically Signed   By: Lajean Manes M.D.   On: 04/06/2018 11:23   Ct Abdomen Pelvis W Contrast  Result Date: 04/05/2018 CLINICAL DATA:  Patient has bilateral lower abdominal sharp intermittent pain with nausea without vomiting. H/x diverticulitis patient states pain and symptoms similar to past. EXAM: CT ABDOMEN AND PELVIS WITH CONTRAST TECHNIQUE: Multidetector CT imaging of the abdomen and pelvis was performed using the standard protocol following bolus administration of intravenous contrast. CONTRAST:  31m ISOVUE-300 IOPAMIDOL (ISOVUE-300) INJECTION 61% COMPARISON:  02/06/2018 FINDINGS: Lower chest: There is  scarring in both lung bases. Coronary artery calcifications are present. The heart is normal in size. Hepatobiliary: Liver is diffusely low attenuation. No focal liver lesions. Cholecystectomy. Pancreas: Unremarkable. No pancreatic ductal dilatation or surrounding inflammatory changes. Spleen: Normal in size without focal abnormality. Adrenals/Urinary Tract: Adrenal glands are normal. There is symmetric enhancement and excretion from the kidneys. 1 millimeter intrarenal calcification identified in the LOWER pole of the RIGHT kidney, not associated with obstruction. Ureters are unremarkable. The bladder and visualized portion of the urethra are normal. Stomach/Bowel: The stomach is normal in appearance. Small bowel loops are normal in caliber and wall thickness. The appendix is well seen and has a normal appearance. There is significant inflammatory change in the region of sigmoid colon and associated numerous diverticula in this segment. Fluid extends in the sigmoid mesentery. The diseased segment of sigmoid is adjacent to the uterus and the urinary bladder. No evidence for abscess or perforation. Vascular/Lymphatic: There is atherosclerotic calcification of the abdominal aorta. There is normal vascular opacification of the celiac axis, superior mesenteric artery, and inferior mesenteric artery. Normal appearance of the portal venous system and inferior vena cava. Reproductive: The uterus is present and is surrounded by inflammatory changes from the sigmoid diverticulitis. No adnexal mass. Other: Trace amount of free pelvic fluid. Musculoskeletal: Incompletely imaged fluid attenuation collection within the LEFT hip abductors, measuring at least 7.3 x 4.8 centimeters. The appearance is stable since 2017. There are degenerative changes in the lumbar spine with 7 millimeters anterolisthesis of L4 on L5. Median sternotomy. IMPRESSION: 1. Acute sigmoid diverticulitis with significant fluid in the sigmoid mesentery. There  is no evidence for perforation or abscess. 2. Coronary artery calcifications. 3. Hepatic steatosis.  Cholecystectomy. 4. Nonobstructing intrarenal calculus in the LOWER pole the RIGHT kidney. 5. Normal appendix. 6.  Aortic atherosclerosis. 7. Probably benign ganglion cyst within the LEFT hip abductors. Electronically Signed   By: ENolon NationsM.D.   On: 04/05/2018 22:06   UKoreaVenous Img Upper Uni Right  Result Date: 04/15/2018 CLINICAL DATA:  None EXAM: RIGHT UPPER EXTREMITY VENOUS DOPPLER ULTRASOUND TECHNIQUE: Gray-scale sonography with graded compression, as well as color Doppler and duplex ultrasound were performed  to evaluate the upper extremity deep venous system from the level of the subclavian vein and including the jugular, axillary, basilic, radial, ulnar and upper cephalic vein. Spectral Doppler was utilized to evaluate flow at rest and with distal augmentation maneuvers. COMPARISON:  None. FINDINGS: Contralateral Subclavian Vein: Respiratory phasicity is normal and symmetric with the symptomatic side. No evidence of thrombus. Normal compressibility. Internal Jugular Vein: No evidence of thrombus. Normal compressibility, respiratory phasicity and response to augmentation. Subclavian Vein: No evidence of thrombus. Normal compressibility, respiratory phasicity and response to augmentation. Axillary Vein: No evidence of thrombus. Normal compressibility, respiratory phasicity and response to augmentation. Cephalic Vein: No evidence of thrombus. Normal compressibility, respiratory phasicity and response to augmentation. Basilic Vein: No evidence of thrombus. Normal compressibility, respiratory phasicity and response to augmentation. Brachial Veins: Occlusive thrombus of the right brachial vein, paired brachial veins. Radial Veins: No evidence of thrombus. Normal compressibility, respiratory phasicity and response to augmentation. Ulnar Veins: No evidence of thrombus. Normal compressibility, respiratory  phasicity and response to augmentation. Other Findings:  Edema IMPRESSION: Sonographic survey of the right upper extremity is positive for occlusive DVT of the paired right brachial vein. Electronically Signed   By: Corrie Mckusick D.O.   On: 04/15/2018 14:17   Dg Chest Port 1 View  Result Date: 04/11/2018 CLINICAL DATA:  PICC placement. EXAM: PORTABLE CHEST 1 VIEW COMPARISON:  Chest radiograph 04/06/2018. FINDINGS: Cardiomegaly.  Mild vascular congestion.  Prior CABG. RIGHT arm PICC line tip appears to lie distal SVC. This is marked with an arrow. No pneumothorax. IMPRESSION: RIGHT arm PICC line tip appears to lie distal SVC. No pneumothorax. Cardiomegaly with mild vascular congestion, stable from priors. Electronically Signed   By: Staci Righter M.D.   On: 04/11/2018 09:34   Ct Image Guided Fluid Drain By Catheter  Result Date: 04/16/2018 INDICATION: Pelvic abscess x2 EXAM: PELVIC ABSCESS DRAIN X2 CT-GUIDED MEDICATIONS: The patient is currently admitted to the hospital and receiving intravenous antibiotics. The antibiotics were administered within an appropriate time frame prior to the initiation of the procedure. ANESTHESIA/SEDATION: Fentanyl 0 mcg IV; Versed 4 mg IV, 4 mg morphine sulfate Moderate Sedation Time:  53 minutes The patient was continuously monitored during the procedure by the interventional radiology nurse under my direct supervision. COMPLICATIONS: None immediate. PROCEDURE: Informed written consent was obtained from the patient after a thorough discussion of the procedural risks, benefits and alternatives. All questions were addressed. Maximal Sterile Barrier Technique was utilized including caps, mask, sterile gowns, sterile gloves, sterile drape, hand hygiene and skin antiseptic. A timeout was performed prior to the initiation of the procedure. In the prone position, the right gluteal region was prepped and draped in a sterile fashion. Under CT guidance, an 18 gauge needle was advanced  into the pelvic abscess and removed over an Amplatz wire. Ten [place] dilator followed by a 10 [place] drain were inserted in the fluid collection. Pus was aspirated. The patient was placed supine. The lower abdomen was prepped and draped in a sterile fashion. 1% lidocaine was utilized for local anesthesia. Under CT guidance, an 18 gauge needle was inserted into the upper pelvic fluid collection and removed over an Amplatz wire. Ten [place] dilator followed by 10 [place] drain were inserted. Cloudy yellow fluid was aspirated. FINDINGS: Images demonstrate 71 French drain placement into the 2 fluid collections described above. IMPRESSION: Successful pelvic abscess drain x2 Electronically Signed   By: Marybelle Killings M.D.   On: 04/16/2018 16:01   Ct Image Guided Drainage  By Percutaneous Catheter  Result Date: 04/24/2018 CLINICAL DATA:  Diverticular abscess EXAM: CT GUIDED DRAINAGE OF PELVIC ABSCESS ANESTHESIA/SEDATION: Intravenous Fentanyl and Versed were administered as conscious sedation during continuous monitoring of the patient's level of consciousness and physiological / cardiorespiratory status by the radiology RN, with a total moderate sedation time of 17 minutes. PROCEDURE: The procedure, risks, benefits, and alternatives were explained to the patient. Questions regarding the procedure were encouraged and answered. The patient understands and consents to the procedure. Select axial scans through the pelvis were obtained. The right lower quadrant collection was localized, an appropriate skin entry site and trajectory identified, and the skin marked. The operative field was prepped with chlorhexidinein a sterile fashion, and a sterile drape was applied covering the operative field. A sterile gown and sterile gloves were used for the procedure. Local anesthesia was provided with 1% Lidocaine. Under CT fluoroscopic guidance, an 18 gauge trocar needle was advanced into the collection. Gas and fluid returned through  the needle hub. Amplatz guidewire advanced easily into the collection, confirmed on CT. Tract dilated to facilitate placement of a 12 French pigtail catheter, formed centrally within the collection. Position confirmed on CT. 5 mL of greenish feculent aspirate were sent for Gram stain and culture. The catheter was secured externally with 0 Prolene suture and StatLock and placed to gravity drain bag. The patient tolerated the procedure well. COMPLICATIONS: None immediate FINDINGS: Loculated right lower quadrant gas and fluid collection was localized. 12 French pigtail drain catheter placed as above. IMPRESSION: 1. Technically successful CT-guided placement of right lower quadrant abscess drain catheter. Electronically Signed   By: Lucrezia Europe M.D.   On: 04/24/2018 08:28   [place] Ekg Site Rite  Result Date: 04/23/2018 If Site Rite image not attached, placement could not be confirmed due to current cardiac rhythm.    CBC Recent Labs  Lab 04/20/18 0504 04/21/18 0406 04/22/18 0359 04/23/18 0329 04/24/18 0833  WBC 7.4 7.8 7.7 9.1 8.8  HGB 8.6* 8.7* 9.0* 9.0* 8.5*  HCT 26.2* 26.1* 26.9* 28.2* 26.4*  PLT 292 334 358 405* 352  MCV 89.4 88.5 89.4 91.3 94.0  MCH 29.4 29.5 29.9 29.1 30.2  MCHC 32.8 33.3 33.5 31.9 32.2  RDW 16.0* 16.2* 16.3* 16.5* 16.8*  LYMPHSABS  --   --   --   --  0.4*  MONOABS  --   --   --   --  1.4*  EOSABS  --   --   --   --  0.4  BASOSABS  --   --   --   --  0.1    Chemistries  Recent Labs  Lab 04/18/18 0309 04/19/18 0419 04/21/18 0406 04/22/18 0359 04/23/18 0329 04/24/18 0833  NA 138 138 138 139 138 139  K 3.1* 3.5 3.3* 3.3* 3.6 3.4*  CL 99 100 97* 97* 94* 92*  CO2 28 30 31  34* 33* 37*  GLUCOSE 106* 96 91 109* 97 142*  BUN 15 20 20 17 17 20   CREATININE 1.09* 1.07* 1.31* 1.02* 1.11* 1.07*  CALCIUM 7.3* 7.4* 7.6* 7.7* 7.9* 7.6*  MG 0.8* 1.5*  --   --   --  0.9*  AST  --   --   --   --   --  55*  ALT  --   --   --   --   --  28  ALKPHOS  --   --   --   --   --  48  BILITOT  --   --   --   --   --  0.6   ------------------------------------------------------------------------------------------------------------------ Recent Labs    04/24/18 0833  TRIG 113    Lab Results  Component Value Date   HGBA1C 5.9 (H) 04/07/2018   ------------------------------------------------------------------------------------------------------------------ No results for input(s): TSH, T4TOTAL, T3FREE, THYROIDAB in the last 72 hours.  Invalid input(s): FREET3 ------------------------------------------------------------------------------------------------------------------ No results for input(s): VITAMINB12, FOLATE, FERRITIN, TIBC, IRON, RETICCTPCT in the last 72 hours.  Coagulation profile Recent Labs  Lab 04/23/18 0329  INR 1.25    No results for input(s): DDIMER in the last 72 hours.  Cardiac Enzymes No results for input(s): CKMB, TROPONINI, MYOGLOBIN in the last 168 hours.  Invalid input(s): CK ------------------------------------------------------------------------------------------------------------------    Component Value Date/Time   BNP 34.0 06/14/2014 1415    Valetta Mulroy MD [facility] pgr [phone] 11/03/2017, 9:25 AM  [facility] - Office  [phone]

## 2018-04-24 NOTE — Progress Notes (Signed)
Pharmacy Antibiotic Note  [name] is a 72 y.o. female admitted on 04/05/2018 with Sepsis with pneumoperitoneum secondary to worsening perforated sigmoid diverticulitis with multiple abdominal fluid collections and multiple abscesses.  Pharmacy dosing Merrem dosing.  Sepsis with pneumoperitoneum secondary to worsening perforated sigmoid diverticulitis with multiple abdominal fluid collections and multiple abscesses.2 drains placed 11/6.-resumed Merrem.   SCr 1.18, CrCl ~ 20ml/min  Cipro 10/26 >> 10/27 Flagyl 10/26 >> 10/27 Merrem 10/27>>11/4,  11/5 >>  10/26 BCx: negative 11/13: Pelvic abscess>> reincubated 11/6: Pelvic abscess >>Rare clostridium/peptostreptococcus 11/6: Pelvic abscess 2: multiple organisms>>none predominant 10/28 MRSA PCR: neg  Plan: Continue meropenem 1g IV every 8 hours F/u reincubated Cx, LOT, and renal function  Height: 5' 5.5" (166.4 cm) Weight: 260 lb 12.9 oz (118.3 kg) IBW/kg (Calculated) : 58.15  Temp (24hrs), Avg:98.8 F (37.1 C), Min:98.4 F (36.9 C), Max:99.4 F (37.4 C)  Recent Labs  Lab 04/18/18 0309 04/19/18 0419 04/20/18 0504 04/21/18 0406 04/22/18 0359 04/23/18 0329 04/24/18 0833  WBC 8.5 7.2 7.4 7.8 7.7 9.1 8.8  CREATININE 1.09* 1.07*  --  1.31* 1.02* 1.11*  --     Estimated Creatinine Clearance: 58.6 mL/min (A) (by C-G formula based on SCr of 1.11 mg/dL (H)).    Allergies  Allergen Reactions  . Fentanyl Anaphylaxis and Shortness Of Breath  . Lactose Intolerance (Gi) Other (See Comments)    G.I. Upset  . Butrans [Buprenorphine] Rash and Other (See Comments)    Infected skin underneath application  . Penicillins Rash    Facial rash Has patient had a PCN reaction causing immediate rash, facial/tongue/throat swelling, SOB or lightheadedness with hypotension: Yes Has patient had a PCN reaction causing severe rash involving mucus membranes or skin necrosis: No Has patient had a PCN reaction that required hospitalization  No Has patient had a PCN reaction occurring within the last 10 years: Yes If all of the above answers are "NO", then may proceed with Cephalosporin use.   . Simvastatin Rash    Bertis Ruddy, PharmD Clinical Pharmacist Please check AMION for all [place] numbers 04/24/2018 9:50 AM

## 2018-04-24 NOTE — Progress Notes (Signed)
Pharmacy Antibiotic Note  [name] is a 72 y.o. female admitted on 04/05/2018 with Sepsis with pneumoperitoneum secondary to worsening perforated sigmoid diverticulitis with multiple abdominal fluid collections and multiple abscesses.  Pharmacy dosing Merrem dosing.  Sepsis with pneumoperitoneum secondary to worsening perforated sigmoid diverticulitis with multiple abdominal fluid collections and multiple abscesses.2 drains placed 11/6.-resumed Merrem.  SCr 1.07, CrCl ~ 58mL/min, AF, WBC wnl, now s/p additional drain yesterday  Cipro 10/26 >> 10/27 Flagyl 10/26 >> 10/27 Merrem 10/27>>11/4,  11/5 >>  10/26 BCx: negative 11/13: Pelvic abscess>> no growth 11/6: Pelvic abscess >>Rare clostridium/peptostreptococcus 11/6: Pelvic abscess 2: multiple organisms>>none predominant 10/28 MRSA PCR: neg  Plan: Continue meropenem 1g IV every 8 hours F/u Cx updates and LOT, renal function  Height: 5' 5.5" (166.4 cm) Weight: 260 lb 12.9 oz (118.3 kg) IBW/kg (Calculated) : 58.15  Temp (24hrs), Avg:98.8 F (37.1 C), Min:98.4 F (36.9 C), Max:99.4 F (37.4 C)  Recent Labs  Lab 04/19/18 0419 04/20/18 0504 04/21/18 0406 04/22/18 0359 04/23/18 0329 04/24/18 0833  WBC 7.2 7.4 7.8 7.7 9.1 8.8  CREATININE 1.07*  --  1.31* 1.02* 1.11* 1.07*    Estimated Creatinine Clearance: 60.8 mL/min (A) (by C-G formula based on SCr of 1.07 mg/dL (H)).    Allergies  Allergen Reactions  . Fentanyl Anaphylaxis and Shortness Of Breath  . Lactose Intolerance (Gi) Other (See Comments)    G.I. Upset  . Butrans [Buprenorphine] Rash and Other (See Comments)    Infected skin underneath application  . Penicillins Rash    Facial rash Has patient had a PCN reaction causing immediate rash, facial/tongue/throat swelling, SOB or lightheadedness with hypotension: Yes Has patient had a PCN reaction causing severe rash involving mucus membranes or skin necrosis: No Has patient had a PCN reaction that required  hospitalization No Has patient had a PCN reaction occurring within the last 10 years: Yes If all of the above answers are "NO", then may proceed with Cephalosporin use.   . Simvastatin Rash    Bertis Ruddy, PharmD Clinical Pharmacist Please check AMION for all [place] numbers 04/24/2018 10:27 AM

## 2018-04-24 NOTE — Progress Notes (Signed)
CSW continuing to follow patient for discharge needs.  Melody Locus Jalynn Betzold LCSW [phone]

## 2018-04-24 NOTE — Progress Notes (Signed)
Heparin drip was stopped per MD order. Will continue to monitor.

## 2018-04-24 NOTE — Progress Notes (Signed)
CRITICAL VALUE ALERT  Critical Value:  Md 0.9  Date & Time Notied:  04/24/2018; 10:04  Provider Notified: Dr. Waldron Labs  Orders Received/Actions taken:

## 2018-04-24 NOTE — Progress Notes (Signed)
[facility] Progress Note     Subjective: CC-  Feels "pretty good" this morning.   Objective: Vital signs in last 24 hours: Temp:  [98.4 F (36.9 C)-99.4 F (37.4 C)] 98.4 F (36.9 C) (11/14 0602) Pulse Rate:  [63-84] 84 (11/14 0602) Resp:  [14-18] 14 (11/14 0602) BP: (94-132)/(51-81) 106/51 (11/14 0602) SpO2:  [94 %-99 %] 96 % (11/14 0602) Weight:  [118.3 kg] 118.3 kg (11/14 0500) Last BM Date: 04/23/18  Intake/Output from previous day: 11/13 0701 - 11/14 0700 In: 1154.2 [P.O.:90; I.V.:619.2; IV Piggyback:400] Out: 1105 [Urine:1000; Drains:105] Intake/Output this shift: No intake/output data recorded.  PE: Gen: Alert, NAD HEENT: EOM's intact, pupils equal and round Pulm: effort normal OJJ:KKXFG, soft, +BS, no HSM,drain x3 with purulent drainage (#1 27.5cc, # 2 30cc, #3 47.5cc), mild diffuse tenderness/most significant in right LQ, no peritonitis Psych: A&Ox3 Skin: no rashes noted, warm and dry  WICK is still in place despite order to remove yesterday  Lab Results:  Recent Labs    04/23/18 0329 04/24/18 0833  WBC 9.1 8.8  HGB 9.0* 8.5*  HCT 28.2* 26.4*  PLT 405* 352   BMET Recent Labs    04/22/18 0359 04/23/18 0329  NA 139 138  K 3.3* 3.6  CL 97* 94*  CO2 34* 33*  GLUCOSE 109* 97  BUN 17 17  CREATININE 1.02* 1.11*  CALCIUM 7.7* 7.9*   PT/INR Recent Labs    04/23/18 0329  LABPROT 15.6*  INR 1.25   CMP     Component Value Date/Time   NA 138 04/23/2018 0329   K 3.6 04/23/2018 0329   CL 94 (L) 04/23/2018 0329   CO2 33 (H) 04/23/2018 0329   GLUCOSE 97 04/23/2018 0329   BUN 17 04/23/2018 0329   CREATININE 1.11 (H) 04/23/2018 0329   CALCIUM 7.9 (L) 04/23/2018 0329   PROT 5.6 (L) 04/15/2018 0859   ALBUMIN 2.1 (L) 04/15/2018 0859   AST 69 (H) 04/15/2018 0859   ALT 43 04/15/2018 0859   ALKPHOS 63 04/15/2018 0859   BILITOT 1.0 04/15/2018 0859   GFRNONAA 48 (L) 04/23/2018 0329   GFRAA 56 (L) 04/23/2018 0329   Lipase  No  results found for: LIPASE     Studies/Results: Ct Abdomen Pelvis Wo Contrast  Result Date: 04/22/2018 CLINICAL DATA:  Follow-up diverticulitis EXAM: CT ABDOMEN AND PELVIS WITHOUT CONTRAST TECHNIQUE: Multidetector CT imaging of the abdomen and pelvis was performed following the standard protocol without IV contrast. Sagittal and coronal MPR images reconstructed from axial data set. Patient drank dilute oral contrast for exam COMPARISON:  04/15/2018 FINDINGS: Lower chest: Bibasilar atelectasis and tiny pleural effusions. Calcified granulomata at lung bases. Hepatobiliary: Gallbladder surgically absent. No focal hepatic abnormalities. Pancreas: Normal appearance Spleen: Normal appearance Adrenals/Urinary Tract: Adrenal glands normal appearance. Tiny nonobstructing calculus at inferior pole LEFT kidney. Kidneys, ureters, and bladder otherwise normal appearance. Stomach/Bowel: Stomach under distended with suboptimal assessment of wall thickness. Diffuse diverticulosis of the transverse, descending, and sigmoid colon. Persistent wall thickening of the sigmoid colon. Wall thickening also identified at the cecum extending to the ileocecal valve, slightly increased since previous study. No evidence of bowel obstruction. Small bowel loops unremarkable. Vascular/Lymphatic: Atherosclerotic calcifications aorta and iliac arteries. No adenopathy Reproductive: Uterus unremarkable.  Ovaries poorly visualized. Other: Pigtail drainage catheter in pelvis with minimal residual fluid at the previously identified large pelvic abscess which was drained. Pigtail drainage catheter within the mesentery in the mid abdomen with minimal residual fluid within the previously seen  abscess collection. This residual fluid extends to a second persistent collection of fluid within the mesentery adjacent to a small bowel loop, consistent with residual abscess, 4.4 x 3.5 cm previously 5.1 x 3.6 cm. Additional large gas and minimal fluid  containing collection in the RIGHT pelvis medial and inferior to the cecum measures 8.2 x 5.0 x 6.8 cm previously 8.2 x 5.9 x 8.0 cm. No new abscess collections. No free intraperitoneal air. Stranding of tissue planes in the pelvis again identified with minimal fluid at the LEFT pericolic gutter and perisplenic. Musculoskeletal: No acute osseous findings. IMPRESSION: Interval resolution of large pelvic abscess collection post drainage. Minimal residual mesenteric abscess collection post drainage though the minimal residual fluid appears to communicate with a second collection in the mesentery in the LEFT mid abdomen adjacent to a small bowel loop measuring 4.4 x 3.5 cm slightly decreased since previous exam. Persistent large extraluminal gas collection in the RIGHT pelvis 8.2 x 5.0 x 6.8 cm, only slightly decreased. Diffuse colonic diverticulosis with persistent wall thickening of the sigmoid colon. Electronically Signed   By: Lavonia Dana M.D.   On: 04/22/2018 13:05   Ct Image Guided Drainage By Percutaneous Catheter  Result Date: 04/24/2018 CLINICAL DATA:  Diverticular abscess EXAM: CT GUIDED DRAINAGE OF PELVIC ABSCESS ANESTHESIA/SEDATION: Intravenous Fentanyl and Versed were administered as conscious sedation during continuous monitoring of the patient's level of consciousness and physiological / cardiorespiratory status by the radiology RN, with a total moderate sedation time of 17 minutes. PROCEDURE: The procedure, risks, benefits, and alternatives were explained to the patient. Questions regarding the procedure were encouraged and answered. The patient understands and consents to the procedure. Select axial scans through the pelvis were obtained. The right lower quadrant collection was localized, an appropriate skin entry site and trajectory identified, and the skin marked. The operative field was prepped with chlorhexidinein a sterile fashion, and a sterile drape was applied covering the operative field.  A sterile gown and sterile gloves were used for the procedure. Local anesthesia was provided with 1% Lidocaine. Under CT fluoroscopic guidance, an 18 gauge trocar needle was advanced into the collection. Gas and fluid returned through the needle hub. Amplatz guidewire advanced easily into the collection, confirmed on CT. Tract dilated to facilitate placement of a 12 French pigtail catheter, formed centrally within the collection. Position confirmed on CT. 5 mL of greenish feculent aspirate were sent for Gram stain and culture. The catheter was secured externally with 0 Prolene suture and StatLock and placed to gravity drain bag. The patient tolerated the procedure well. COMPLICATIONS: None immediate FINDINGS: Loculated right lower quadrant gas and fluid collection was localized. 12 French pigtail drain catheter placed as above. IMPRESSION: 1. Technically successful CT-guided placement of right lower quadrant abscess drain catheter. Electronically Signed   By: Lucrezia Europe M.D.   On: 04/24/2018 08:28   [place] Ekg Site Rite  Result Date: 04/23/2018 If Site Rite image not attached, placement could not be confirmed due to current cardiac rhythm.   Anti-infectives: Anti-infectives (From admission, onward)   Start     Dose/Rate Route Frequency Ordered Stop   04/15/18 1700  meropenem (MERREM) 1 g in sodium chloride 0.9 % 100 mL IVPB     1 g 200 mL/hr over 30 Minutes Intravenous Every 8 hours 04/15/18 1534     04/09/18 2200  meropenem (MERREM) 1 g in sodium chloride 0.9 % 100 mL IVPB  Status:  Discontinued     1 g 200 mL/hr over  30 Minutes Intravenous Every 8 hours 04/09/18 1244 04/14/18 0944   04/06/18 1300  meropenem (MERREM) 1 g in sodium chloride 0.9 % 100 mL IVPB  Status:  Discontinued     1 g 200 mL/hr over 30 Minutes Intravenous Every 12 hours 04/06/18 1145 04/09/18 1244   04/06/18 1145  meropenem (MERREM) 1 g in sodium chloride 0.9 % 100 mL IVPB  Status:  Discontinued     1 g 200 mL/hr over 30  Minutes Intravenous Every 8 hours 04/06/18 1139 04/06/18 1145   04/06/18 0900  ciprofloxacin (CIPRO) IVPB 400 mg  Status:  Discontinued     400 mg 200 mL/hr over 60 Minutes Intravenous Every 12 hours 04/06/18 0732 04/06/18 1139   04/06/18 0300  metroNIDAZOLE (FLAGYL) IVPB 500 mg  Status:  Discontinued     500 mg 100 mL/hr over 60 Minutes Intravenous Every 8 hours 04/05/18 2335 04/06/18 1139   04/05/18 1930  ciprofloxacin (CIPRO) IVPB 400 mg     400 mg 200 mL/hr over 60 Minutes Intravenous  Once 04/05/18 1925 04/05/18 2217   04/05/18 1930  metroNIDAZOLE (FLAGYL) IVPB 500 mg     500 mg 100 mL/hr over 60 Minutes Intravenous  Once 04/05/18 1925 04/05/18 2102       Assessment/Plan HTN HLD 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 - on plavix (last dose 10/27) H/o CVA  Morbidly obese Chronic pain RUE DVT - on IV heparin  Acute perforated sigmoid diverticulitis with multiple abscesses - CT scan 11/5 revealsat least 3 fluid collectionsin the lower abdomen/pelvis with previous perforated diverticulitis; fluid collections are7.3 x 5.8 cm in left lower quadrant, air-fluid collection measuring 8.2 x 5.9 cm in the right lower quadrant, and8.9 x 5.3 cm fluid collection in the pelvis in the pre rectal space which appears to contain stool and fluid -s/pIR perc drains x 2 - 04/16/18, culture with multiple organisms present/none predominant - CT scan 11/12 showed resolution of large pelvic abscess, minimal residual mesenteric abscess although this fluid appears to communicate with a second collection in the mesentery in the LEFT mid abdomen adjacent to a small bowel loop measuring 4.4 x 3.5 cm, and persistent large extraluminal gas collection in the RIGHT pelvis 8.2 x 5.0 x 6.8 cm. S/p third IR drain 11/13.  ID -currently merrem 10/27>> VTE -SCDs, IV heparin FEN -IVF,NPO, TPN  Plan: Continue current drains and IV antibiotics. Continue TPN. Can advance diet to clears. Discussed importance of  mobilization/OOB, again.    LOS: 77 days    Clovis Riley ,[facility] 04/24/2018, 9:34 AM

## 2018-04-24 NOTE — Progress Notes (Signed)
PHARMACY - ADULT TOTAL PARENTERAL NUTRITION CONSULT NOTE   Pharmacy Consult for TPN Indication: perforated diverticulitis  Patient Measurements: Height: 5' 5.5" (166.4 cm) Weight: 260 lb 12.9 oz (118.3 kg) IBW/kg (Calculated) : 58.15 TPN AdjBW (KG): 75.1 Body mass index is 42.74 kg/m. Usual Weight:    Assessment: Admitted 10/26 with acute sigmoid diverticulitis without perf or abscess.   PMH: HTN, HLD, CKD3, RA, DM, CAD, CVA, CRI, fibromyalgia,morbid obesity, chronic pain, RUE DVT  GI: Acute perforated sigmoid diverticulitis with multiple abscesses. Patient has continued to have inadequate oral intake since admission. Clear liquid diet. Po PPI. 45ml po intake noted. LBM 11/13. Drain output 105. Prealbumin 6.6. RD called me 11/13 about high risk for refeeding.  11/5: at least 3 fluid collectionsin the lower abdomen/pelvis with previous perforated diverticulitis; 11/12 CT: resolution of large pelvic abscess, minimal residual mesenteric abscess although this fluid appears to communicate with a second collection in the mesentery in the LEFT mid abdomen adjacent to a small bowel loop, and persistent large extraluminal gas collection in the RIGHT pelvis-place additional 3rd drain.  Endo: A1C 5.9. (glipizide PTA)  DM, CBGs 116, 135 since starting TPN last PM. Insulin requirements in the past 24 hours: 4 units  Lytes: K 3.4 low. Cl low, bicarb high, Mg 0.9 low. Ca adjusts to 9.44. Refeeding Renal:CKD3. Scr 1 Pulm: [place] Cards: VSS on IV Lasix, metoprolol, Pravach Hepatobil: AST slightly elevated Neuro:RA, fibromyalgia, h/o CV. Lexapro, Lyrica KD:TOIZTI from perforated sigmoid diverticulitis on Merrem Anticoag: RUE PICC DVT on IV heparin  TPN Access: removed 11/6 for DVT,  new 04/23/18 TPN start date:04/23/18 Nutritional Goals (per RD recommendation on 04/21/18): [id number] Protein:  85-100 Fluid: 1.6-1.8L  Goal TPN rate is 80 ml/hr   Current Nutrition: Clear liquids, new TPN  11/13.   Plan:  TPN at 40mL/hr. Do not increase rate until refeeding/lyte abnormalities resolved - TPN (at goal) provides 96 g of protein, 326 g of dextrose, and 38.4 g of lipids which provides 1877 kCals per day, meeting 100% of patient needs  Electrolytes in TPN: adjust for refeeding. Max Cl Add MVI, trace elements SSI and adjust as needed Monitor TPN labs Mon/Thurs and prn KCl 6meq IV x 4 Magnesium 4g IV x 1 today    Nery Frappier S. Alford Highland, PharmD, [place] Clinical Staff Pharmacist [phone] [name] 04/24/2018,9:18 AM

## 2018-04-24 NOTE — Progress Notes (Addendum)
ANTICOAGULATION CONSULT NOTE - Follow Up Consult  Pharmacy Consult for Heparin Indication: DVT  Allergies  Allergen Reactions  . Fentanyl Anaphylaxis and Shortness Of Breath  . Lactose Intolerance (Gi) Other (See Comments)    G.I. Upset  . Butrans [Buprenorphine] Rash and Other (See Comments)    Infected skin underneath application  . Penicillins Rash    Facial rash Has patient had a PCN reaction causing immediate rash, facial/tongue/throat swelling, SOB or lightheadedness with hypotension: Yes Has patient had a PCN reaction causing severe rash involving mucus membranes or skin necrosis: No Has patient had a PCN reaction that required hospitalization No Has patient had a PCN reaction occurring within the last 10 years: Yes If all of the above answers are "NO", then may proceed with Cephalosporin use.   . Simvastatin Rash     Labs: Recent Labs    04/22/18 0359 04/23/18 0329 04/24/18 0833  HGB 9.0* 9.0* 8.5*  HCT 26.9* 28.2* 26.4*  PLT 358 405* 352  LABPROT  --  15.6*  --   INR  --  1.25  --   HEPARINUNFRC 0.69 0.58 0.35  CREATININE 1.02* 1.11*  --     Estimated Creatinine Clearance: 58.6 mL/min (A) (by C-G formula based on SCr of 1.11 mg/dL (H)).  Assessment: 72 yo F with acute RUE DVT from PICC. IV heparin.  Heparin level therapeutic at 0.35 on 1500 units/hr s/p restart, CBC low but stable, plts 352.  Goal of Therapy:  Heparin level 0.3-0.7 units/ml Monitor platelets by anticoagulation protocol: Yes   Plan:  Continue heparin gtt at 1500 units/hr Daily heparin level, CBC, s/s bleeding  Addendum: Will now switch to lovenox 120mg  q 12h 1h after stopping heparin gtt  Bertis Ruddy, PharmD Clinical Pharmacist Please check AMION for all [place] numbers 04/24/2018 9:44 AM

## 2018-04-24 NOTE — Progress Notes (Signed)
Referring Physician(s): Dr Georgiann Cocker  Supervising Physician: Dr. Annamaria Boots  Patient Status:  [facility] - In-pt  Chief Complaint:   Intra abd abscess drains x 2 placed in IR 11/6 Diverticular abscess   HPI: Perforated sigmoid diverticulitis with multiple abscesses. She is s/p abdominal abscess drains X2 by our service on 11/6. S/p additional 3rd drain yesterday. Feels ok. Wants to eat something but on TPN/bowel rest   Allergies: Fentanyl; Lactose intolerance (gi); Butrans [buprenorphine]; Penicillins; and Simvastatin  Medications:  Current Facility-Administered Medications:  .  acetaminophen (TYLENOL) tablet 650 mg, 650 mg, Oral, Q6H PRN, 650 mg at 04/19/18 1641 **OR** acetaminophen (TYLENOL) suppository 650 mg, 650 mg, Rectal, Q6H PRN, Tat, David, MD .  albuterol (PROVENTIL) (2.5 MG/3ML) 0.083% nebulizer solution 2.5 mg, 2.5 mg, Nebulization, Q6H PRN, Elgergawy, Silver Huguenin, MD .  Chlorhexidine Gluconate Cloth 2 % PADS 6 each, 6 each, Topical, Daily, Tat, David, MD, 6 each at 04/23/18 1211 .  escitalopram (LEXAPRO) tablet 20 mg, 20 mg, Oral, QHS, Elgergawy, Silver Huguenin, MD, 20 mg at 04/23/18 2206 .  furosemide (LASIX) injection 40 mg, 40 mg, Intravenous, BID, Elgergawy, Silver Huguenin, MD, 40 mg at 04/23/18 1836 .  heparin ADULT infusion 100 units/mL (25000 units/258mL sodium chloride 0.45%), 1,500 Units/hr, Intravenous, Continuous, Elgergawy, Silver Huguenin, MD, Last Rate: 15 mL/hr at 04/24/18 0700, 1,500 Units/hr at 04/24/18 0700 .  hydrALAZINE (APRESOLINE) injection 10 mg, 10 mg, Intravenous, Q6H PRN, Emokpae, Courage, MD .  HYDROcodone-acetaminophen (NORCO) 10-325 MG per tablet 1 tablet, 1 tablet, Oral, Q6H PRN, Roney Jaffe, MD, 1 tablet at 04/23/18 1826 .  insulin aspart (novoLOG) injection 0-15 Units, 0-15 Units, Subcutaneous, Q4H, Karren Cobble, [place], 2 Units at 04/24/18 0017 .  latanoprost (XALATAN) 0.005 % ophthalmic solution 1 drop, 1 drop, Both Eyes, QHS, Tat, David, MD, 1 drop at  04/23/18 2210 .  meropenem (MERREM) 1 g in sodium chloride 0.9 % 100 mL IVPB, 1 g, Intravenous, Q8H, Roney Jaffe, MD, Last Rate: 200 mL/hr at 04/24/18 0310, 1 g at 04/24/18 0310 .  methocarbamol (ROBAXIN) tablet 750 mg, 750 mg, Oral, Q8H PRN, Meuth, Brooke A, PA-C .  metoprolol tartrate (LOPRESSOR) tablet 37.5 mg, 37.5 mg, Oral, BID, Elgergawy, Silver Huguenin, MD, 37.5 mg at 04/23/18 2227 .  nystatin (MYCOSTATIN/NYSTOP) topical powder, , Topical, TID, Elgergawy, Silver Huguenin, MD .  ondansetron (ZOFRAN) injection 4 mg, 4 mg, Intravenous, Q6H PRN, Tat, David, MD, 4 mg at 04/23/18 0425 .  oxyCODONE (Oxy IR/ROXICODONE) immediate release tablet 2.5 mg, 2.5 mg, Oral, Q6H PRN, Elgergawy, Silver Huguenin, MD, 2.5 mg at 04/23/18 2206 .  pantoprazole (PROTONIX) EC tablet 40 mg, 40 mg, Oral, Daily, Tat, David, MD, 40 mg at 04/23/18 0957 .  pravastatin (PRAVACHOL) tablet 40 mg, 40 mg, Oral, QHS, Tat, Shanon Brow, MD, 40 mg at 04/23/18 2206 .  pregabalin (LYRICA) capsule 50 mg, 50 mg, Oral, QHS, Elgergawy, Silver Huguenin, MD, 50 mg at 04/23/18 2205 .  senna-docusate (Senokot-S) tablet 2 tablet, 2 tablet, Oral, QHS PRN, Roney Jaffe, MD .  sodium chloride flush (NS) 0.9 % injection 10-40 mL, 10-40 mL, Intracatheter, Q12H, Tat, David, MD, 10 mL at 04/23/18 1001 .  sodium chloride flush (NS) 0.9 % injection 10-40 mL, 10-40 mL, Intracatheter, PRN, Tat, David, MD .  sodium chloride flush (NS) 0.9 % injection 10-40 mL, 10-40 mL, Intracatheter, PRN, Elgergawy, Dawood S, MD .  sodium chloride flush (NS) 0.9 % injection 5 mL, 5 mL, Intracatheter, Q8H, Hoss, Arthur, MD, 5 mL  at 04/24/18 0606 .  sodium chloride flush (NS) 0.9 % injection 5 mL, 5 mL, Intracatheter, Q8H, Arne Cleveland, MD, 5 mL at 04/24/18 0606 .  timolol (TIMOPTIC) 0.5 % ophthalmic solution 1 drop, 1 drop, Both Eyes, BID, Tat, David, MD, 1 drop at 04/23/18 2211 .  TPN ADULT (ION), , Intravenous, Continuous TPN, Karren Cobble, RPH, Last Rate: 40 mL/hr at 04/24/18 0700 .   traMADol (ULTRAM) tablet 50 mg, 50 mg, Oral, Q12H PRN, Elgergawy, Silver Huguenin, MD, 50 mg at 04/21/18 2351    Vital Signs: BP (!) 106/51 (BP Location: Left Leg)   Pulse 84   Temp 98.4 F (36.9 C) (Oral)   Resp 14   Ht 5' 5.5" (1.664 m)   Wt 118.3 kg   SpO2 96%   BMI 42.74 kg/m   Physical Exam Awake In bed NAD Abdomen soft but diffusely tender Drains in place. New drain with some cloudy pale yellow output  Imaging: Ct Abdomen Pelvis Wo Contrast  Result Date: 04/22/2018 CLINICAL DATA:  Follow-up diverticulitis EXAM: CT ABDOMEN AND PELVIS WITHOUT CONTRAST TECHNIQUE: Multidetector CT imaging of the abdomen and pelvis was performed following the standard protocol without IV contrast. Sagittal and coronal MPR images reconstructed from axial data set. Patient drank dilute oral contrast for exam COMPARISON:  04/15/2018 FINDINGS: Lower chest: Bibasilar atelectasis and tiny pleural effusions. Calcified granulomata at lung bases. Hepatobiliary: Gallbladder surgically absent. No focal hepatic abnormalities. Pancreas: Normal appearance Spleen: Normal appearance Adrenals/Urinary Tract: Adrenal glands normal appearance. Tiny nonobstructing calculus at inferior pole LEFT kidney. Kidneys, ureters, and bladder otherwise normal appearance. Stomach/Bowel: Stomach under distended with suboptimal assessment of wall thickness. Diffuse diverticulosis of the transverse, descending, and sigmoid colon. Persistent wall thickening of the sigmoid colon. Wall thickening also identified at the cecum extending to the ileocecal valve, slightly increased since previous study. No evidence of bowel obstruction. Small bowel loops unremarkable. Vascular/Lymphatic: Atherosclerotic calcifications aorta and iliac arteries. No adenopathy Reproductive: Uterus unremarkable.  Ovaries poorly visualized. Other: Pigtail drainage catheter in pelvis with minimal residual fluid at the previously identified large pelvic abscess which was  drained. Pigtail drainage catheter within the mesentery in the mid abdomen with minimal residual fluid within the previously seen abscess collection. This residual fluid extends to a second persistent collection of fluid within the mesentery adjacent to a small bowel loop, consistent with residual abscess, 4.4 x 3.5 cm previously 5.1 x 3.6 cm. Additional large gas and minimal fluid containing collection in the RIGHT pelvis medial and inferior to the cecum measures 8.2 x 5.0 x 6.8 cm previously 8.2 x 5.9 x 8.0 cm. No new abscess collections. No free intraperitoneal air. Stranding of tissue planes in the pelvis again identified with minimal fluid at the LEFT pericolic gutter and perisplenic. Musculoskeletal: No acute osseous findings. IMPRESSION: Interval resolution of large pelvic abscess collection post drainage. Minimal residual mesenteric abscess collection post drainage though the minimal residual fluid appears to communicate with a second collection in the mesentery in the LEFT mid abdomen adjacent to a small bowel loop measuring 4.4 x 3.5 cm slightly decreased since previous exam. Persistent large extraluminal gas collection in the RIGHT pelvis 8.2 x 5.0 x 6.8 cm, only slightly decreased. Diffuse colonic diverticulosis with persistent wall thickening of the sigmoid colon. Electronically Signed   By: Lavonia Dana M.D.   On: 04/22/2018 13:05   Ct Image Guided Drainage By Percutaneous Catheter  Result Date: 04/24/2018 CLINICAL DATA:  Diverticular abscess EXAM: CT GUIDED DRAINAGE OF PELVIC  ABSCESS ANESTHESIA/SEDATION: Intravenous Fentanyl and Versed were administered as conscious sedation during continuous monitoring of the patient's level of consciousness and physiological / cardiorespiratory status by the radiology RN, with a total moderate sedation time of 17 minutes. PROCEDURE: The procedure, risks, benefits, and alternatives were explained to the patient. Questions regarding the procedure were encouraged  and answered. The patient understands and consents to the procedure. Select axial scans through the pelvis were obtained. The right lower quadrant collection was localized, an appropriate skin entry site and trajectory identified, and the skin marked. The operative field was prepped with chlorhexidinein a sterile fashion, and a sterile drape was applied covering the operative field. A sterile gown and sterile gloves were used for the procedure. Local anesthesia was provided with 1% Lidocaine. Under CT fluoroscopic guidance, an 18 gauge trocar needle was advanced into the collection. Gas and fluid returned through the needle hub. Amplatz guidewire advanced easily into the collection, confirmed on CT. Tract dilated to facilitate placement of a 12 French pigtail catheter, formed centrally within the collection. Position confirmed on CT. 5 mL of greenish feculent aspirate were sent for Gram stain and culture. The catheter was secured externally with 0 Prolene suture and StatLock and placed to gravity drain bag. The patient tolerated the procedure well. COMPLICATIONS: None immediate FINDINGS: Loculated right lower quadrant gas and fluid collection was localized. 12 French pigtail drain catheter placed as above. IMPRESSION: 1. Technically successful CT-guided placement of right lower quadrant abscess drain catheter. Electronically Signed   By: Lucrezia Europe M.D.   On: 04/24/2018 08:28   [place] Ekg Site Rite  Result Date: 04/23/2018 If Site Rite image not attached, placement could not be confirmed due to current cardiac rhythm.   Labs:  CBC: Recent Labs    04/21/18 0406 04/22/18 0359 04/23/18 0329 04/24/18 0833  WBC 7.8 7.7 9.1 8.8  HGB 8.7* 9.0* 9.0* 8.5*  HCT 26.1* 26.9* 28.2* 26.4*  PLT 334 358 405* 352    COAGS: Recent Labs    04/06/18 1952 04/23/18 0329  INR 1.22 1.25  APTT 33  --     BMP: Recent Labs    04/21/18 0406 04/22/18 0359 04/23/18 0329 04/24/18 0833  NA 138 139 138 139  K  3.3* 3.3* 3.6 3.4*  CL 97* 97* 94* 92*  CO2 31 34* 33* 37*  GLUCOSE 91 109* 97 142*  BUN 20 17 17 20   CALCIUM 7.6* 7.7* 7.9* 7.6*  CREATININE 1.31* 1.02* 1.11* 1.07*  GFRNONAA 39* 53* 48* 50*  GFRAA 46* >60 56* 58*    LIVER FUNCTION TESTS: Recent Labs    04/09/18 0355 04/10/18 0351 04/15/18 0859 04/24/18 0833  BILITOT 0.5 0.7 1.0 0.6  AST 54* 41 69* 55*  ALT 36 30 43 28  ALKPHOS 55 51 63 59  PROT 5.2* 5.5* 5.6* 4.7*  ALBUMIN 2.2* 2.2* 2.1* 1.7*    Assessment and Plan:  s/p perc drains X 2 on 11/6 for multiple diverticular abscesses. Persistent large extraluminal gas collection in the RIGHT pelvis 8.2 x 5.0 x 6.8 cm, only slightly decreased. S/p placement of 3rd drain on 11/13 IR following.    Electronically Signed: Ascencion Dike, PA-C 04/24/2018, 10:11 AM    I spent a total of 15 Minutes at the the patient's bedside AND on the patient's hospital floor or unit, greater than 50% of which was counseling/coordinating care for abd drain placement.

## 2018-04-25 LAB — CBC
HEMATOCRIT: 26.4 % — AB (ref 36.0–46.0)
HEMOGLOBIN: 8.1 g/dL — AB (ref 12.0–15.0)
MCH: 29.1 pg (ref 26.0–34.0)
MCHC: 30.7 g/dL (ref 30.0–36.0)
MCV: 95 fL (ref 80.0–100.0)
NRBC: 0.5 % — AB (ref 0.0–0.2)
PLATELETS: 380 10*3/uL (ref 150–400)
RBC: 2.78 MIL/uL — AB (ref 3.87–5.11)
RDW: 16.7 % — ABNORMAL HIGH (ref 11.5–15.5)
WBC: 8.9 10*3/uL (ref 4.0–10.5)

## 2018-04-25 LAB — GLUCOSE, CAPILLARY
GLUCOSE-CAPILLARY: 130 mg/dL — AB (ref 70–99)
GLUCOSE-CAPILLARY: 125 mg/dL — AB (ref 70–99)
GLUCOSE-CAPILLARY: 114 mg/dL — AB (ref 70–99)
GLUCOSE-CAPILLARY: 111 mg/dL — AB (ref 70–99)
Glucose-Capillary: 111 mg/dL — ABNORMAL HIGH (ref 70–99)
Glucose-Capillary: 126 mg/dL — ABNORMAL HIGH (ref 70–99)
Glucose-Capillary: 129 mg/dL — ABNORMAL HIGH (ref 70–99)

## 2018-04-25 LAB — BASIC METABOLIC PANEL
ANION GAP: 8 (ref 5–15)
BUN: 26 mg/dL — AB (ref 8–23)
CHLORIDE: 93 mmol/L — AB (ref 98–111)
CO2: 37 mmol/L — ABNORMAL HIGH (ref 22–32)
Calcium: 7.7 mg/dL — ABNORMAL LOW (ref 8.9–10.3)
Creatinine, Ser: 1.18 mg/dL — ABNORMAL HIGH (ref 0.44–1.00)
GFR calc Af Amer: 52 mL/min — ABNORMAL LOW (ref >60)
GFR, EST NON AFRICAN AMERICAN: 45 mL/min — AB (ref >60)
GLUCOSE: 129 mg/dL — AB (ref 70–99)
POTASSIUM: 3.4 mmol/L — AB (ref 3.5–5.1)
Sodium: 138 mmol/L (ref 135–145)

## 2018-04-25 LAB — MAGNESIUM: Magnesium: 1.5 mg/dL — ABNORMAL LOW (ref 1.7–2.4)

## 2018-04-25 LAB — HEPARIN LEVEL (UNFRACTIONATED): Heparin Unfractionated: 0.88 IU/mL — ABNORMAL HIGH (ref 0.30–0.70)

## 2018-04-25 MED ORDER — TRAVASOL 10 % IV SOLN
INTRAVENOUS | Status: DC
  Administered 2018-04-25: 17:00:00 via INTRAVENOUS
  Filled 2018-04-25: qty 480

## 2018-04-25 MED ORDER — POTASSIUM CHLORIDE 10 MEQ/100ML IV SOLN
10.0000 meq | INTRAVENOUS | Status: AC
  Administered 2018-04-25 (×4): 10 meq via INTRAVENOUS
  Filled 2018-04-25 (×3): qty 100

## 2018-04-25 MED ORDER — MAGNESIUM SULFATE 4 GM/100ML IV SOLN
4.0000 g | Freq: Once | INTRAVENOUS | Status: AC
  Administered 2018-04-25: 4 g via INTRAVENOUS
  Filled 2018-04-25: qty 100

## 2018-04-25 NOTE — Progress Notes (Signed)
Patient is alert and calm, cbg check q4h, insulin given per order. Vital signs stable. 3 drains on the RLQ, LLQ, back are flushed per order, patent and draining milky-like fluid. No acute distress noted.

## 2018-04-25 NOTE — Progress Notes (Signed)
Nutrition Follow-up  DOCUMENTATION CODES:   Morbid obesity  INTERVENTION:  -Continue TPN per pharmacy   -Continue monitoring of refeeding syndrome labs (potassium, magnesium, phosphorus) and replete as needed   NUTRITION DIAGNOSIS:   Inadequate oral intake related to inability to eat as evidenced by energy intake < or equal to 50% for > or equal to 5 days(NPO/clear liquid status >5 days).  Ongoing  GOAL:   Patient will meet greater than or equal to 90% of their needs  Progressing --> TPN at 40 ml/hr with goal rate 80 ml/hr   MONITOR:   PO intake, Diet advancement, Labs  ASSESSMENT:   72 year old female with PMH significant for CAD, HLD/HTN, CVA, T2DM, RA, and fibromyalgia/chronic pain. Pt presented w/ 2 weeks of lower abdominal pain. Had recently been dx w/ UTI and palced on abx, but pain continued. Initial CT scan showed diverticulitis w/o perf, but pt developed hypotension and repeat CT did show perforation. Surgery consulted.  S/p 3rd IR drain 11/13, culture pending New PICC access 11/13 left arm due to previous right arm DVT 11/6 TPN start date 11/13 at 40 ml/hr Goal rate is 80 ml/hr providing 96 g protein, 1877 kcals per day, meeting 100% needs Pt has been refeeding through today--> lytes being repleted by pharmacy  TPN currently still at 40 ml/ hr; not advancing to goal until refeeding resolved  Visited pt at bedside, no family present. Pt sleeping but roused to voice and touch. She says she is "feeling a little better" but is tired. She has no complaints of N/V; she is having diarrhea which has been ongoing during admission per her report.    Explained to pt that TPN not yet at goal rate due to refeeding syndrome and electrolyte abnormalities, but will be managed by pharmacy and advanced when appropriate. Pt agreeable and thankful for visit.   Meds: TPN 40 ml/hr, lasix, novolog ss, Protonix Labs: CBGs 119-130, potassium 3.4, magnesium 1.5, GFR 45, phosphorus 3.7  (11/14)   Diet Order:   Diet Order            Diet clear liquid Room service appropriate? Yes; Fluid consistency: Thin  Diet effective now              EDUCATION NEEDS:   Education needs have been addressed  Skin:  Skin Assessment: Reviewed RN Assessment  Last BM:  11/13  Height:   Ht Readings from Last 1 Encounters:  04/16/18 5' 5.5" (1.664 m)    Weight:   Wt Readings from Last 1 Encounters:  04/24/18 118.3 kg    Ideal Body Weight:  56.82 kg  BMI:  Body mass index is 42.74 kg/m.  Estimated Nutritional Needs:   Kcal:  1600-1800 (MSJ +/- 100)  Protein:  85-100g Pro (1.5-1.8g/kg ibw)  Fluid:  1.6-1.8 L fluid (15ml/kcal)    Kiernan Farkas, Dietetic Intern [phone]

## 2018-04-25 NOTE — Progress Notes (Signed)
[facility] Progress Note     Subjective: CC-  Patient states that she may feel a little better than yesterday, but she is very tired this morning. Abdominal pain in LLQ. Denies any n/v. 2 loose BMs yesterday.  She did stand with PT yesterday and spend time sitting in the chair. WBC 8.9, afebrile.  Objective: Vital signs in last 24 hours: Temp:  [97.5 F (36.4 C)-98.2 F (36.8 C)] 97.5 F (36.4 C) (11/15 0543) Pulse Rate:  [77-84] 82 (11/15 0543) Resp:  [18-20] 20 (11/15 0543) BP: (104-113)/(51-55) 104/51 (11/15 0543) SpO2:  [96 %-99 %] 99 % (11/15 0543) Last BM Date: 04/22/18  Intake/Output from previous day: 11/14 0701 - 11/15 0700 In: 944.2 [P.O.:320; I.V.:189.2; IV Piggyback:400] Out: 842.5 [Urine:800; Drains:42.5] Intake/Output this shift: No intake/output data recorded.  PE: Gen: Alert, NAD HEENT: EOM's intact, pupils equal and round Pulm: effort normal JYN:WGNFA, soft, +BS, no HSM,drain x3with trace purulent drainage in bags (#1 10cc, #2 20cc, #3 12.5cc),TTP LLQ, no peritonitis Psych: A&Ox3 Skin: no rashes noted, warm and dry   Lab Results:  Recent Labs    04/24/18 0833 04/25/18 0619  WBC 8.8 8.9  HGB 8.5* 8.1*  HCT 26.4* 26.4*  PLT 352 380   BMET Recent Labs    04/24/18 0833 04/25/18 0619  NA 139 138  K 3.4* 3.4*  CL 92* 93*  CO2 37* 37*  GLUCOSE 142* 129*  BUN 20 26*  CREATININE 1.07* 1.18*  CALCIUM 7.6* 7.7*   PT/INR Recent Labs    04/23/18 0329  LABPROT 15.6*  INR 1.25   CMP     Component Value Date/Time   NA 138 04/25/2018 0619   K 3.4 (L) 04/25/2018 0619   CL 93 (L) 04/25/2018 0619   CO2 37 (H) 04/25/2018 0619   GLUCOSE 129 (H) 04/25/2018 0619   BUN 26 (H) 04/25/2018 0619   CREATININE 1.18 (H) 04/25/2018 0619   CALCIUM 7.7 (L) 04/25/2018 0619   PROT 4.7 (L) 04/24/2018 0833   ALBUMIN 1.7 (L) 04/24/2018 0833   AST 55 (H) 04/24/2018 0833   ALT 28 04/24/2018 0833   ALKPHOS 59 04/24/2018 0833   BILITOT  0.6 04/24/2018 0833   GFRNONAA 45 (L) 04/25/2018 0619   GFRAA 52 (L) 04/25/2018 0619   Lipase  No results found for: LIPASE     Studies/Results: Ct Image Guided Drainage By Percutaneous Catheter  Result Date: 04/24/2018 CLINICAL DATA:  Diverticular abscess EXAM: CT GUIDED DRAINAGE OF PELVIC ABSCESS ANESTHESIA/SEDATION: Intravenous Fentanyl and Versed were administered as conscious sedation during continuous monitoring of the patient's level of consciousness and physiological / cardiorespiratory status by the radiology RN, with a total moderate sedation time of 17 minutes. PROCEDURE: The procedure, risks, benefits, and alternatives were explained to the patient. Questions regarding the procedure were encouraged and answered. The patient understands and consents to the procedure. Select axial scans through the pelvis were obtained. The right lower quadrant collection was localized, an appropriate skin entry site and trajectory identified, and the skin marked. The operative field was prepped with chlorhexidinein a sterile fashion, and a sterile drape was applied covering the operative field. A sterile gown and sterile gloves were used for the procedure. Local anesthesia was provided with 1% Lidocaine. Under CT fluoroscopic guidance, an 18 gauge trocar needle was advanced into the collection. Gas and fluid returned through the needle hub. Amplatz guidewire advanced easily into the collection, confirmed on CT. Tract dilated to facilitate placement of a 12 [place]  pigtail catheter, formed centrally within the collection. Position confirmed on CT. 5 mL of greenish feculent aspirate were sent for Gram stain and culture. The catheter was secured externally with 0 Prolene suture and StatLock and placed to gravity drain bag. The patient tolerated the procedure well. COMPLICATIONS: None immediate FINDINGS: Loculated right lower quadrant gas and fluid collection was localized. 12 French pigtail drain catheter placed  as above. IMPRESSION: 1. Technically successful CT-guided placement of right lower quadrant abscess drain catheter. Electronically Signed   By: Lucrezia Europe M.D.   On: 04/24/2018 08:28    Anti-infectives: Anti-infectives (From admission, onward)   Start     Dose/Rate Route Frequency Ordered Stop   04/15/18 1700  meropenem (MERREM) 1 g in sodium chloride 0.9 % 100 mL IVPB     1 g 200 mL/hr over 30 Minutes Intravenous Every 8 hours 04/15/18 1534     04/09/18 2200  meropenem (MERREM) 1 g in sodium chloride 0.9 % 100 mL IVPB  Status:  Discontinued     1 g 200 mL/hr over 30 Minutes Intravenous Every 8 hours 04/09/18 1244 04/14/18 0944   04/06/18 1300  meropenem (MERREM) 1 g in sodium chloride 0.9 % 100 mL IVPB  Status:  Discontinued     1 g 200 mL/hr over 30 Minutes Intravenous Every 12 hours 04/06/18 1145 04/09/18 1244   04/06/18 1145  meropenem (MERREM) 1 g in sodium chloride 0.9 % 100 mL IVPB  Status:  Discontinued     1 g 200 mL/hr over 30 Minutes Intravenous Every 8 hours 04/06/18 1139 04/06/18 1145   04/06/18 0900  ciprofloxacin (CIPRO) IVPB 400 mg  Status:  Discontinued     400 mg 200 mL/hr over 60 Minutes Intravenous Every 12 hours 04/06/18 0732 04/06/18 1139   04/06/18 0300  metroNIDAZOLE (FLAGYL) IVPB 500 mg  Status:  Discontinued     500 mg 100 mL/hr over 60 Minutes Intravenous Every 8 hours 04/05/18 2335 04/06/18 1139   04/05/18 1930  ciprofloxacin (CIPRO) IVPB 400 mg     400 mg 200 mL/hr over 60 Minutes Intravenous  Once 04/05/18 1925 04/05/18 2217   04/05/18 1930  metroNIDAZOLE (FLAGYL) IVPB 500 mg     500 mg 100 mL/hr over 60 Minutes Intravenous  Once 04/05/18 1925 04/05/18 2102       Assessment/Plan HTN HLD 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 - on plavix (last dose 10/27) H/o CVA  Morbidly obese Chronic pain RUE DVT - on IV heparin  Acute perforated sigmoid diverticulitis with multiple abscesses - CT scan 11/5 revealsat least 3 fluid collectionsin the lower  abdomen/pelvis with previous perforated diverticulitis; fluid collections are7.3 x 5.8 cm in left lower quadrant, air-fluid collection measuring 8.2 x 5.9 cm in the right lower quadrant, and8.9 x 5.3 cm fluid collection in the pelvis in the pre rectal space which appears to contain stool and fluid -s/pIR perc drains x 2 - 04/16/18, culture with multiple organisms present/none predominant - CT scan 11/12 showed resolution of large pelvic abscess, minimal residual mesenteric abscess although this fluid appears to communicate with a second collection in the mesentery in the LEFT mid abdomen adjacent to a small bowel loop measuring 4.4 x 3.5 cm, and persistent large extraluminal gas collection in the RIGHT pelvis 8.2 x 5.0 x 6.8 cm.  - S/p 3rd IR drain 11/13, culture pending  ID -currently merrem 10/27>> VTE -SCDs, IV heparin FEN -IVF, CLD, TPN  Plan: Continue drains and IV antibiotics. Continue  TPN and CLD - encourage patient to drink Boost Breeze. Continue PT/mobilization, patient did do better with this yesterday. If pain persists may consider repeat CT scan 2-3 days.   LOS: 18 days    Wellington Hampshire , [facility] 04/25/2018, 8:22 AM Pager: [phone] Mon 7:00 am -11:30 AM Tues-Fri 7:00 am-4:30 pm Sat-Sun 7:00 am-11:30 am

## 2018-04-25 NOTE — Progress Notes (Signed)
PHARMACY - ADULT TOTAL PARENTERAL NUTRITION CONSULT NOTE   Pharmacy Consult for TPN Indication: perforated diverticulitis  Patient Measurements: Height: 5' 5.5" (166.4 cm) Weight: 260 lb 12.9 oz (118.3 kg) IBW/kg (Calculated) : 58.15 TPN AdjBW (KG): 75.1 Body mass index is 42.74 kg/m.  Assessment: Admitted 10/26 with acute sigmoid diverticulitis without perf or abscess.   PMH: HTN, HLD, CKD3, RA, DM, CAD, CVA, CRI, fibromyalgia,morbid obesity, chronic pain, RUE DVT  GI: Acute perforated sigmoid diverticulitis with multiple abscesses. Patient has continued to have inadequate oral intake since admission. Clear liquid diet. Po PPI. 20ml po intake noted. LBM 11/13. Drain output 105. Prealbumin 6.6. RD called me 11/13 about high risk for refeeding.  11/5: at least 3 fluid collectionsin the lower abdomen/pelvis with previous perforated diverticulitis; 11/12 CT: resolution of large pelvic abscess, minimal residual mesenteric abscess although this fluid appears to communicate with a second collection in the mesentery in the LEFT mid abdomen adjacent to a small bowel loop, and persistent large extraluminal gas collection in the RIGHT pelvis-place additional 3rd drain.  Endo: A1C 5.9. (glipizide PTA)  DM, CBGs 115 - 129. Insulin requirements in the past 24 hours: 8 units  Lytes: K 3.4 low. Cl low, bicarb high, Mg 1.5 low. Ca adjusts to 9.5. Patient continues with refeeding Renal:CKD3. Scr 1.18 Pulm: [place] Cards: VSS on IV Lasix, metoprolol, Pravach Hepatobil: AST slightly elevated Neuro:RA, fibromyalgia, h/o CV. Lexapro, Lyrica DJ:MEQAST from perforated sigmoid diverticulitis on Merrem Anticoag: RUE PICC DVT on IV heparin  TPN Access: removed 11/6 for DVT,  new 04/23/18 TPN start date:04/23/18 Nutritional Goals (per RD recommendation on 04/21/18): [id number] Protein:  85-100 Fluid: 1.6-1.8L  Goal TPN rate is 80 ml/hr   Current Nutrition: Clear liquids, new TPN 11/13.   Plan:   Continue TPN at 83mL/hr. Do not increase rate until refeeding/lyte abnormalities resolved - TPN (at goal) provides 96 g of protein, 326 g of dextrose, and 38.4 g of lipids which provides 1877 kCals per day, meeting 100% of patient needs  Electrolytes in TPN: adjust for refeeding. Max Cl Add MVI, trace elements  SSI and adjust as needed Monitor TPN labs Mon/Thurs and prn KCl 18meq IV x 4 Magnesium 4g IV x 1 today   Alanda Slim, PharmD, [place] Clinical Pharmacist Please see AMION for all Pharmacists' Contact Phone Numbers 04/25/2018, 8:22 AM

## 2018-04-25 NOTE — Progress Notes (Signed)
Referring Physician(s): Dr Waldron Labs  Supervising Physician: Markus Daft  Patient Status:  [facility] - In-pt  Chief Complaint:  Acute perforated sigmoid diverticulitis with multiple abscesses  Subjective:  11/6: Successful pelvic abscess drain x2; TG and anterior abd drains Additional drain RLQ yesterday Pt does feel some better-- less pressure    Allergies: Fentanyl; Lactose intolerance (gi); Butrans [buprenorphine]; Penicillins; and Simvastatin  Medications: Prior to Admission medications   Medication Sig Start Date End Date Taking? Authorizing Provider  Blood Glucose Monitoring Suppl (FREESTYLE LITE) DEVI See admin instructions. 12/22/14  Yes [provider]  ciprofloxacin (CIPRO) 500 MG/5ML (10%) suspension Take by mouth 2 (two) times daily.   Yes [provider]  clopidogrel (PLAVIX) 75 MG tablet Take 1 tablet (75 mg total) by mouth daily with breakfast. 01/14/18  Yes Rehman, Mechele Dawley, MD  Dexlansoprazole 30 MG capsule Take 40 mg by mouth daily.   Yes [provider]  diclofenac sodium (VOLTAREN) 1 % GEL Apply 2 g topically 4 (four) times daily as needed (Pain).    Yes [provider]  glipiZIDE (GLUCOTROL) 10 MG tablet Take 10 mg by mouth daily before breakfast.   Yes [provider]  HYDROcodone-acetaminophen (NORCO) 10-325 MG tablet Take 1 tablet by mouth 4 (four) times daily.   Yes [provider]  IRON PO Take 1 tablet by mouth daily.   Yes [provider]  isosorbide dinitrate (ISORDIL) 5 MG tablet Take 1 tablet (5 mg total) by mouth 3 (three) times daily before meals. 02/07/18  Yes Rehman, Mechele Dawley, MD  leflunomide (ARAVA) 20 MG tablet Take 20 mg by mouth daily.   Yes [provider]  metoprolol succinate (TOPROL-XL) 25 MG 24 hr tablet Take 25 mg by mouth daily.   Yes [provider]  metroNIDAZOLE (FLAGYL) 250 MG tablet Take 250 mg by mouth 3 (three) times daily.   Yes [provider]    Olmesartan-amLODIPine-HCTZ (TRIBENZOR) 40-5-25 MG TABS Take 1 tablet by mouth daily.    Yes [provider]  pravastatin (PRAVACHOL) 40 MG tablet Take 40 mg by mouth at bedtime.   Yes [provider]  predniSONE (DELTASONE) 5 MG tablet Take 5 mg by mouth daily with breakfast.   Yes [provider]  pregabalin (LYRICA) 75 MG capsule Take 75 mg by mouth 3 (three) times daily.   Yes [provider]  Tafluprost (ZIOPTAN) 0.0015 % SOLN Place 1 drop into both eyes at bedtime.    Yes [provider]  temazepam (RESTORIL) 30 MG capsule Take 30 mg by mouth at bedtime.   Yes [provider]  timolol (BETIMOL) 0.5 % ophthalmic solution Place 1 drop into both eyes 2 (two) times daily.    Yes [provider]  Tofacitinib Citrate (XELJANZ) 5 MG TABS Take 1 tablet by mouth daily.   Yes [provider]  VITAMIN D, ERGOCALCIFEROL, PO Take 50,000 Units by mouth once a week. Thursdays   Yes [provider]  docusate sodium (COLACE) 100 MG capsule Take 2 capsules (200 mg total) by mouth at bedtime. Patient taking differently: Take 200 mg by mouth as needed.  02/13/18   Rogene Houston, MD     Vital Signs: BP (!) 105/51 (BP Location: Left Leg)   Pulse 75   Temp 98.8 F (37.1 C) (Oral)   Resp 17   Ht 5' 5.5" (1.664 m)   Wt 260 lb 12.9 oz (118.3 kg)   SpO2 98%  BMI 42.74 kg/m   Physical Exam  Constitutional: She is oriented to person, place, and time.  Abdominal: Soft. There is tenderness.  Neurological: She is alert and oriented to person, place, and time.  Skin: Skin is warm and dry.  All drains intact TG drain -- OP brown fluid Anterior abd drain - OP serous New RLQ abd drain_ OP gas and brown  RARE CLOSTRIDIUM CLOSTRIDIOFORME  RARE PEPTOSTREPTOCOCCUS ASACCHAROLYTICUS  Psychiatric: She has a normal mood and affect. Her behavior is normal.  Vitals reviewed.   Imaging: Ct Abdomen Pelvis Wo Contrast  Result Date:  04/22/2018 CLINICAL DATA:  Follow-up diverticulitis EXAM: CT ABDOMEN AND PELVIS WITHOUT CONTRAST TECHNIQUE: Multidetector CT imaging of the abdomen and pelvis was performed following the standard protocol without IV contrast. Sagittal and coronal MPR images reconstructed from axial data set. Patient drank dilute oral contrast for exam COMPARISON:  04/15/2018 FINDINGS: Lower chest: Bibasilar atelectasis and tiny pleural effusions. Calcified granulomata at lung bases. Hepatobiliary: Gallbladder surgically absent. No focal hepatic abnormalities. Pancreas: Normal appearance Spleen: Normal appearance Adrenals/Urinary Tract: Adrenal glands normal appearance. Tiny nonobstructing calculus at inferior pole LEFT kidney. Kidneys, ureters, and bladder otherwise normal appearance. Stomach/Bowel: Stomach under distended with suboptimal assessment of wall thickness. Diffuse diverticulosis of the transverse, descending, and sigmoid colon. Persistent wall thickening of the sigmoid colon. Wall thickening also identified at the cecum extending to the ileocecal valve, slightly increased since previous study. No evidence of bowel obstruction. Small bowel loops unremarkable. Vascular/Lymphatic: Atherosclerotic calcifications aorta and iliac arteries. No adenopathy Reproductive: Uterus unremarkable.  Ovaries poorly visualized. Other: Pigtail drainage catheter in pelvis with minimal residual fluid at the previously identified large pelvic abscess which was drained. Pigtail drainage catheter within the mesentery in the mid abdomen with minimal residual fluid within the previously seen abscess collection. This residual fluid extends to a second persistent collection of fluid within the mesentery adjacent to a small bowel loop, consistent with residual abscess, 4.4 x 3.5 cm previously 5.1 x 3.6 cm. Additional large gas and minimal fluid containing collection in the RIGHT pelvis medial and inferior to the cecum measures 8.2 x 5.0 x 6.8 cm  previously 8.2 x 5.9 x 8.0 cm. No new abscess collections. No free intraperitoneal air. Stranding of tissue planes in the pelvis again identified with minimal fluid at the LEFT pericolic gutter and perisplenic. Musculoskeletal: No acute osseous findings. IMPRESSION: Interval resolution of large pelvic abscess collection post drainage. Minimal residual mesenteric abscess collection post drainage though the minimal residual fluid appears to communicate with a second collection in the mesentery in the LEFT mid abdomen adjacent to a small bowel loop measuring 4.4 x 3.5 cm slightly decreased since previous exam. Persistent large extraluminal gas collection in the RIGHT pelvis 8.2 x 5.0 x 6.8 cm, only slightly decreased. Diffuse colonic diverticulosis with persistent wall thickening of the sigmoid colon. Electronically Signed   By: Lavonia Dana M.D.   On: 04/22/2018 13:05   Ct Image Guided Drainage By Percutaneous Catheter  Result Date: 04/24/2018 CLINICAL DATA:  Diverticular abscess EXAM: CT GUIDED DRAINAGE OF PELVIC ABSCESS ANESTHESIA/SEDATION: Intravenous Fentanyl and Versed were administered as conscious sedation during continuous monitoring of the patient's level of consciousness and physiological / cardiorespiratory status by the radiology RN, with a total moderate sedation time of 17 minutes. PROCEDURE: The procedure, risks, benefits, and alternatives were explained to the patient. Questions regarding the procedure were encouraged and answered. The patient understands and consents to the procedure. Select axial scans through the pelvis  were obtained. The right lower quadrant collection was localized, an appropriate skin entry site and trajectory identified, and the skin marked. The operative field was prepped with chlorhexidinein a sterile fashion, and a sterile drape was applied covering the operative field. A sterile gown and sterile gloves were used for the procedure. Local anesthesia was provided with 1%  Lidocaine. Under CT fluoroscopic guidance, an 18 gauge trocar needle was advanced into the collection. Gas and fluid returned through the needle hub. Amplatz guidewire advanced easily into the collection, confirmed on CT. Tract dilated to facilitate placement of a 12 French pigtail catheter, formed centrally within the collection. Position confirmed on CT. 5 mL of greenish feculent aspirate were sent for Gram stain and culture. The catheter was secured externally with 0 Prolene suture and StatLock and placed to gravity drain bag. The patient tolerated the procedure well. COMPLICATIONS: None immediate FINDINGS: Loculated right lower quadrant gas and fluid collection was localized. 12 French pigtail drain catheter placed as above. IMPRESSION: 1. Technically successful CT-guided placement of right lower quadrant abscess drain catheter. Electronically Signed   By: Lucrezia Europe M.D.   On: 04/24/2018 08:28   [place] Ekg Site Rite  Result Date: 04/23/2018 If Site Rite image not attached, placement could not be confirmed due to current cardiac rhythm.   Labs:  CBC: Recent Labs    04/22/18 0359 04/23/18 0329 04/24/18 0833 04/25/18 0619  WBC 7.7 9.1 8.8 8.9  HGB 9.0* 9.0* 8.5* 8.1*  HCT 26.9* 28.2* 26.4* 26.4*  PLT 358 405* 352 380    COAGS: Recent Labs    04/06/18 1952 04/23/18 0329  INR 1.22 1.25  APTT 33  --     BMP: Recent Labs    04/22/18 0359 04/23/18 0329 04/24/18 0833 04/25/18 0619  NA 139 138 139 138  K 3.3* 3.6 3.4* 3.4*  CL 97* 94* 92* 93*  CO2 34* 33* 37* 37*  GLUCOSE 109* 97 142* 129*  BUN 17 17 20  26*  CALCIUM 7.7* 7.9* 7.6* 7.7*  CREATININE 1.02* 1.11* 1.07* 1.18*  GFRNONAA 53* 48* 50* 45*  GFRAA >60 56* 58* 52*    LIVER FUNCTION TESTS: Recent Labs    04/09/18 0355 04/10/18 0351 04/15/18 0859 04/24/18 0833  BILITOT 0.5 0.7 1.0 0.6  AST 54* 41 69* 55*  ALT 36 30 43 28  ALKPHOS 55 51 63 59  PROT 5.2* 5.5* 5.6* 4.7*  ALBUMIN 2.2* 2.2* 2.1* 1.7*     Assessment and Plan:  Drains x3 intact Some better today Drinking more fluids Will follow   Electronically Signed: Armandina Iman A, PA-C 04/25/2018, 11:29 AM   I spent a total of 25 Minutes at the the patient's bedside AND on the patient's hospital floor or unit, greater than 50% of which was counseling/coordinating care for abscess drains x 3

## 2018-04-25 NOTE — Progress Notes (Signed)
Patient Demographics:    [name], is a 72 y.o. female, DOB - 1945-10-05, LKG:[id number]  Admit date - 04/05/2018   Admitting Physician [name], MD  Outpatient Primary MD for the patient is [name], MD  LOS - 18   Chief Complaint  Patient presents with  . Abdominal Pain      Brief narrative:  72yo female PMH HTN, DM-II, CKD, RA, H/o CAD s/p CABG (on plavix,), who was admitted to [facility] 10/26 with acute sigmoid diverticulitis without perforation or abscess. Initially felt to be improving on IV merrem, but she continued to have abdominal pain, CT abdomen pelvis was obtained 04/15/2018, was significant for worsening diverticulitis now with multiple abdominal fluid collections.  Patient developed acute right upper extremity DVT in the setting of PICC line, she was transferred from [facility] to [facility] 04/15/2018 for evaluation by IR.  She has drain placed by IR 04/16/2018 X2, repeat CT abdomen pelvis 04/23/2018, showing improvement of fluid collection at abscess and current drain, but significant right lower quadrant abdomen air/fluid collection, which she required a third drain to be placed 04/23/2018.   Subjective:    [name] denies any fever or chills overnight, no further diarrhea, reports abdominal pain at baseline, no significant events overnight.   Assessment  & P lan :    Principal Problem:   Diverticulitis Active Problems:   DM (diabetes mellitus) ([place])   HTN (hypertension)   Rheumatoid arthritis ([place])   Anemia   Renal insufficiency   Sepsis due to undetermined organism ([place])   CKD (chronic kidney disease) stage 3, GFR 30-59 ml/min (H[facility])   Fibromyalgia   Diverticulitis large intestine w/o perforation or abscess w/o bleeding   Acute diverticulitis   Perforated sigmoid colon ([place])   Acute renal failure superimposed on stage 3 chronic kidney disease ([place])  On advisory Sepsis  due to acute perforated sigmoid diverticulitis- -sepsis criteria met on admission - imaging 04/06/2018 on admission significant for perforated sigmoid diverticulitis, was managed conservatively on IV meropenem, clear liquid diet, . -Repeat imaging 04/15/2018 with large abscesses in the abdomen, transferred to [facility] for further care, general surgery consult greatly appreciated. -IR consulted, they have placed to drainage in her mesenteric abscess, pelvic abscess, cultures were growing multiple organisms, none predominant, no anaerobes isolated, continue with meropenem. -Repeat CT abdomen pelvis today showing large pelvis abscess, as well 1 of the mesenteric abscesses has resolved, though communicating with another still present abscess , and still present large right abdomen gas/fluid area, discussed with general surgery, recommendation for IR to drain, he had third drain placed by IR 04/23/2018. -He remains with poor oral intake, so she was started on TPN.   Acute right upper extremity DVT -In the setting of PICC line insertion, plan discontinued 04/16/2018, can delete to acute right upper extremity DVT, initially on heparin, transition to Lovenox, final plan to transition to Eliquis once no further procedure/surgery is anticipated -PICC line  discontinued 04/16/2018  acute kidney injury on CKD3. Baseline creat 1.2.   - AKI resolved w/ IVF but creat down to 0.7 now, appears to be volume overloaded, continue with diuresis as blood pressure tolerates.  Fibromyalgia -Discussed with the patient, and on her home dose Lyrica  Hypertension -Continue with metoprolol, given soft blood pressure continue to hold home medication including as  isosorbide/ olmesartan/amlodipine/HCTZ combo -Patient remains on IV Lasix, still with some volume overload, will increase her Lasix to 40 twice daily   DM2 -   BS's are good, cont Novolog/Humalog Sliding scale insulin  H/o CAD sp CABG /hx TIA - previously on  Plavix and pravastatin, continue to hold Plavix may need further procedures or surgery  Debility -PT is consulted, SNF is recommended -Patient has not been very compliant with her physical therapy, the hard to convince to participate in PT, and sit on the chair, will continue to encourage with physical activity.   acute on chronic anemia--- sp 2 u prbc's,   Started on Lexapro for depression  Paroxysmal atrial tachycardia -She had episode of PAT 04/22/2018 overnight, sit for a few minutes, she had echo done hospital stay with a preserved EF, her beta-blocker has been increased, no recurrence since.    Code Status : full  Family communication: Husband at bedside  Disposition Plan  : TBD, will prob need SNF  Consults  :  Gen Surgery, IR  Procedures: -Right arm PICC line inserted on admission, discontinued 04/16/2018 due to DVT -Left arm PICC line inserted 04/23/2018 - s/pIR perc drains x 2 - 04/16/18, S/p third IR drain 11/13.   DVT Prophylaxis  :   SCDs, heparin GTT>>lovenox   Lab Results  Component Value Date   PLT 380 04/25/2018    Inpatient Medications  Scheduled Meds: . Chlorhexidine Gluconate Cloth  6 each Topical Daily  . enoxaparin (LOVENOX) injection  1 mg/kg Subcutaneous Q12H  . escitalopram  20 mg Oral QHS  . furosemide  40 mg Intravenous BID  . insulin aspart  0-15 Units Subcutaneous Q4H  . latanoprost  1 drop Both Eyes QHS  . metoprolol tartrate  37.5 mg Oral BID  . nystatin   Topical TID  . pantoprazole  40 mg Oral Daily  . pravastatin  40 mg Oral QHS  . pregabalin  50 mg Oral QHS  . sodium chloride flush  10-40 mL Intracatheter Q12H  . sodium chloride flush  5 mL Intracatheter Q8H  . sodium chloride flush  5 mL Intracatheter Q8H  . timolol  1 drop Both Eyes BID   Continuous Infusions: . meropenem (MERREM) IV 1 g (04/25/18 0816)  . TPN ADULT (ION) 40 mL/hr at 04/24/18 1720  . TPN ADULT (ION) 40 mL/hr at 04/25/18 1709   PRN Meds:.acetaminophen  **OR** acetaminophen, albuterol, hydrALAZINE, HYDROcodone-acetaminophen, methocarbamol, ondansetron (ZOFRAN) IV, oxyCODONE, senna-docusate, sodium chloride flush, sodium chloride flush, traMADol    Anti-infectives (From admission, onward)   Start     Dose/Rate Route Frequency Ordered Stop   04/15/18 1700  meropenem (MERREM) 1 g in sodium chloride 0.9 % 100 mL IVPB     1 g 200 mL/hr over 30 Minutes Intravenous Every 8 hours 04/15/18 1534     04/09/18 2200  meropenem (MERREM) 1 g in sodium chloride 0.9 % 100 mL IVPB  Status:  Discontinued     1 g 200 mL/hr over 30 Minutes Intravenous Every 8 hours 04/09/18 1244 04/14/18 0944   04/06/18 1300  meropenem (MERREM) 1 g in sodium chloride 0.9 % 100 mL IVPB  Status:  Discontinued     1 g 200 mL/hr over 30 Minutes Intravenous Every 12 hours 04/06/18 1145 04/09/18 1244   04/06/18 1145  meropenem (MERREM) 1 g in sodium chloride 0.9 % 100 mL IVPB  Status:  Discontinued     1 g 200 mL/hr over 30 Minutes Intravenous Every 8 hours 04/06/18 1139 04/06/18 1145   04/06/18 0900  ciprofloxacin (CIPRO) IVPB 400 mg  Status:  Discontinued     400 mg 200 mL/hr over 60 Minutes Intravenous Every 12 hours 04/06/18 0732 04/06/18 1139   04/06/18 0300  metroNIDAZOLE (FLAGYL) IVPB 500 mg  Status:  Discontinued     500 mg 100 mL/hr over 60 Minutes Intravenous Every 8 hours 04/05/18 2335 04/06/18 1139   04/05/18 1930  ciprofloxacin (CIPRO) IVPB 400 mg     400 mg 200 mL/hr over 60 Minutes Intravenous  Once 04/05/18 1925 04/05/18 2217   04/05/18 1930  metroNIDAZOLE (FLAGYL) IVPB 500 mg     500 mg 100 mL/hr over 60 Minutes Intravenous  Once 04/05/18 1925 04/05/18 2102        Objective:   Vitals:   04/24/18 1413 04/24/18 2036 04/25/18 0543 04/25/18 0909  BP: (!) 113/54 (!) 113/55 (!) 104/51 (!) 105/51  Pulse: 77 84 82 75  Resp: 18  20 17   Temp: 98.2 F (36.8 C) 98.1 F (36.7 C) (!) 97.5 F (36.4 C) 98.8 F (37.1 C)  TempSrc: Oral Oral Oral Oral  SpO2: 96%  97% 99% 98%  Weight:      Height:        Wt Readings from Last 3 Encounters:  04/24/18 118.3 kg  02/13/18 116.1 kg  01/13/18 120.7 kg     Intake/Output Summary (Last 24 hours) at 04/25/2018 1732 Last data filed at 04/25/2018 1709 Gross per 24 hour  Intake 1765.38 ml  Output 1567.5 ml  Net 197.88 ml     Physical Exam  Awake Alert, Oriented X 3, No new F.N deficits, Normal affect Symmetrical Chest wall movement, Good air movement bilaterally, CTAB RRR,No Gallops,Rubs or new Murmurs, No Parasternal Heave +ve B.Sounds, Abd Soft, right lower tenderness has significantly improved, patient has percutaneous drain x3 no rebound - guarding or rigidity. No Cyanosis, Clubbing or edema, No new Rash or bruise         Data Review:   Micro Results Recent Results (from the past 240 hour(s))  Aerobic/Anaerobic Culture (surgical/deep wound)     Status: Abnormal   Collection Time: 04/16/18  3:13 PM  Result Value Ref Range Status   Specimen Description PELVIS ABSCESS  Final   Special Requests NONE  Final   Gram Stain   Final    FEW WBC PRESENT,BOTH PMN AND MONONUCLEAR FEW GRAM POSITIVE COCCI IN CLUSTERS FEW GRAM POSITIVE RODS    Culture (A)  Final    MULTIPLE ORGANISMS PRESENT, NONE PREDOMINANT NO ANAEROBES ISOLATED Performed at [facility], [place] [address], [place], [place] [address]    Report Status 04/22/2018 FINAL  Final  Aerobic/Anaerobic Culture (surgical/deep wound)     Status: None   Collection Time: 04/16/18  3:13 PM  Result Value Ref Range Status   Specimen Description ABSCESS PELVIS  Final   Special Requests SAMPLE NO 2  Final   Gram Stain   Final    FEW WBC PRESENT,BOTH PMN AND MONONUCLEAR NO ORGANISMS SEEN Performed at [facility], [address] [place], [place] [address]    Culture   Final    RARE CLOSTRIDIUM CLOSTRIDIOFORME RARE PEPTOSTREPTOCOCCUS ASACCHAROLYTICUS    Report Status 04/23/2018 FINAL  Final  Aerobic/Anaerobic Culture  (surgical/deep wound)     Status: None (Preliminary result)   Collection Time: 04/23/18  3:20 PM  Result Value Ref Range Status   Specimen Description ABSCESS RIGHT LOWER QUADRANT  Final   Special Requests Normal  Final   Gram Stain NO WBC SEEN NO ORGANISMS SEEN   Final   Culture   Final    CULTURE REINCUBATED FOR BETTER GROWTH Performed at [facility], [address], [place], [place] [address]    Report Status PENDING  Incomplete    Radiology Reports Ct Abdomen Pelvis Wo Contrast  Result Date: 04/22/2018 CLINICAL DATA:  Follow-up diverticulitis EXAM: CT ABDOMEN AND PELVIS WITHOUT CONTRAST TECHNIQUE: Multidetector CT imaging of the abdomen and pelvis was performed following the standard protocol without IV contrast. Sagittal and coronal MPR images reconstructed from axial data set. Patient drank dilute oral contrast for exam COMPARISON:  04/15/2018 FINDINGS: Lower chest: Bibasilar atelectasis and tiny pleural effusions. Calcified granulomata at lung bases. Hepatobiliary: Gallbladder surgically absent. No focal hepatic abnormalities. Pancreas: Normal appearance Spleen: Normal appearance Adrenals/Urinary Tract: Adrenal glands normal appearance. Tiny nonobstructing calculus at inferior pole LEFT kidney. Kidneys, ureters, and bladder otherwise normal appearance. Stomach/Bowel: Stomach under distended with suboptimal assessment of wall thickness. Diffuse diverticulosis of the transverse, descending, and sigmoid colon. Persistent wall thickening of the sigmoid colon. Wall thickening also identified at the cecum extending to the ileocecal valve, slightly increased since previous study. No evidence of bowel obstruction. Small bowel loops unremarkable. Vascular/Lymphatic: Atherosclerotic calcifications aorta and iliac arteries. No adenopathy Reproductive: Uterus unremarkable.  Ovaries poorly visualized. Other: Pigtail drainage catheter in pelvis with minimal residual fluid at the previously  identified large pelvic abscess which was drained. Pigtail drainage catheter within the mesentery in the mid abdomen with minimal residual fluid within the previously seen abscess collection. This residual fluid extends to a second persistent collection of fluid within the mesentery adjacent to a small bowel loop, consistent with residual abscess, 4.4 x 3.5 cm previously 5.1 x 3.6 cm. Additional large gas and minimal fluid containing collection in the RIGHT pelvis medial and inferior to the cecum measures 8.2 x 5.0 x 6.8 cm previously 8.2 x 5.9 x 8.0 cm. No new abscess collections. No free intraperitoneal air. Stranding of tissue planes in the pelvis again identified with minimal fluid at the LEFT pericolic gutter and perisplenic. Musculoskeletal: No acute osseous findings. IMPRESSION: Interval resolution of large pelvic abscess collection post drainage. Minimal residual mesenteric abscess collection post drainage though the minimal residual fluid appears to communicate with a second collection in the mesentery in the LEFT mid abdomen adjacent to a small bowel loop measuring 4.4 x 3.5 cm slightly decreased since previous exam. Persistent large extraluminal gas collection in the RIGHT pelvis 8.2 x 5.0 x 6.8 cm, only slightly decreased. Diffuse colonic diverticulosis with persistent wall thickening of the sigmoid colon. Electronically Signed   By: Lavonia Dana M.D.   On: 04/22/2018 13:05   Ct Abdomen Pelvis Wo Contrast  Result Date: 04/15/2018 CLINICAL DATA:  Acute lower abdominal pain. EXAM: CT ABDOMEN AND PELVIS WITHOUT CONTRAST TECHNIQUE: Multidetector CT imaging of the abdomen and pelvis was performed following the standard protocol without IV contrast. COMPARISON:  CT scan of April 06, 2018. FINDINGS: Lower chest: No acute abnormality. Hepatobiliary: No focal liver abnormality is seen. Status post cholecystectomy. No biliary dilatation. Pancreas: Unremarkable. No pancreatic ductal dilatation or surrounding  inflammatory changes. Spleen: Normal in size without focal abnormality. Adrenals/Urinary Tract: Adrenal glands appear normal. Small nonobstructive right renal calculus is noted. No hydronephrosis or renal obstruction is noted. Urinary bladder is unremarkable. Stomach/Bowel: The stomach  and appendix are unremarkable. Sigmoid diverticulosis is noted. Some degree of residual diverticulitis cannot be excluded. However, 7.3 x 5.8 cm fluid collection is noted in left lower quadrant, which communicates with smaller fluid collection measuring 5.1 x 3.6 cm. This is concerning for abscess status post previous perforated diverticulitis. Air-fluid collection measuring 8.2 x 5.9 cm is seen in the right lower quadrant also concerning for possible abscess. 8.9 x 5.3 cm fluid and stool collection is seen posterior to the uterus and anterior to the rectum concerning for abscess or contained perforation. Vascular/Lymphatic: Aortic atherosclerosis. No enlarged abdominal or pelvic lymph nodes. Reproductive: Uterus and bilateral adnexa are unremarkable. Other: Stable 6.9 x 4.5 cm fluid collection in left inguinal region. This most likely is benign. Musculoskeletal: No acute or significant osseous findings. IMPRESSION: There are at least 3 fluid collections and probable abscesses seen in the lower abdomen and pelvis, following previously perforated diverticulitis. 7.3 x 5.8 cm fluid collection is noted in left lower quadrant, as well as air-fluid collection measuring 8.2 x 5.9 cm seen in the right lower quadrant. Also noted is 8.9 x 5.3 cm fluid collection in the pelvis in the pre rectal space which appears to contain stool and fluid. These results will be called to the ordering clinician or representative by the Radiologist Assistant, and communication documented in the PACS or zVision Dashboard. Small nonobstructive right renal calculus. Aortic Atherosclerosis (ICD10-I70.0). Electronically Signed   By: Marijo Conception, M.D.   On:  04/15/2018 14:10   Ct Abdomen Pelvis Wo Contrast  Result Date: 04/06/2018 CLINICAL DATA:  72 year old female with concern for perforated diverticulitis. EXAM: CT ABDOMEN AND PELVIS WITHOUT CONTRAST TECHNIQUE: Multidetector CT imaging of the abdomen and pelvis was performed following the standard protocol without IV contrast. COMPARISON:  Chest radiograph dated 04/06/2018 and CT dated 04/05/2018 FINDINGS: Evaluation of this exam is limited in the absence of intravenous contrast. Lower chest: Diffuse interstitial coarsening and streaky atelectasis/scarring of the lung bases. There is mild cardiomegaly. There is hypoattenuation of the cardiac blood pool suggestive of a degree of anemia. Clinical correlation is recommended. There is pneumoperitoneum and small amount of free fluid within the pelvis. Hepatobiliary: Advanced fatty infiltration of the liver. No intrahepatic biliary ductal dilatation. Cholecystectomy. Pancreas: Unremarkable. No pancreatic ductal dilatation or surrounding inflammatory changes. Spleen: Normal in size without focal abnormality. Adrenals/Urinary Tract: The adrenal glands are unremarkable. There is no hydronephrosis on either side. Excreted contrast in the renal collecting systems bilaterally. The visualized ureters appear unremarkable. There is trabeculated appearance of the bladder wall likely related to chronic bladder dysfunction. Correlation with urinalysis recommended to exclude cystitis. Stomach/Bowel: There is extensive sigmoid diverticulosis. There is associated inflammatory changes and perforation of the sigmoid colon. There is increased size of the extraluminal air since the prior CT. No drainable fluid collection is noted at this time. There is no bowel obstruction. Normal appendix. Vascular/Lymphatic: There is advanced aortoiliac atherosclerotic disease. No portal venous gas. There is no adenopathy. Reproductive: The uterus is grossly unremarkable. Other: None Musculoskeletal:  Degenerative changes of the spine. No acute osseous pathology. IMPRESSION: 1. Perforated sigmoid diverticulitis with increased size of the extraluminal air since the prior CT. No drainable fluid collection noted at this time. 2. No bowel obstruction. Normal appendix. 3. Advanced fatty infiltration of the liver. These results were called by telephone at the time of interpretation on 04/06/2018 at 11:09 pm to nurse Hearn, who verbally acknowledged these results. Electronically Signed   By: Laren Everts.D.  On: 04/06/2018 23:25   Dg Chest 2 View  Result Date: 04/06/2018 CLINICAL DATA:  Dyspnea, sepsis, pt listless and unable to cooperate EXAM: CHEST - 2 VIEW COMPARISON:  06/27/2016 and 06/18/2016. FINDINGS: On the lateral view, there is evidence of a small amount free intraperitoneal air under the left hemidiaphragm. Lung volumes are low. There is opacity at the bases that is likely due to atelectasis along with bronchovascular crowding. No convincing pneumonia and no evidence of pulmonary edema. 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 Cardiac silhouette is normal in size. No mediastinal hilar masses. No pleural effusion.  No pneumothorax. Skeletal structures are grossly intact. IMPRESSION: 1. Small amount of free intraperitoneal air. Recommend follow-up abdomen and pelvis CT with contrast for further assessment. 2. Somewhat limited study due to low lung volumes. Allowing for this, no acute cardiopulmonary disease. Electronically Signed   By: Lajean Manes M.D.   On: 04/06/2018 11:23   Ct Abdomen Pelvis W Contrast  Result Date: 04/05/2018 CLINICAL DATA:  Patient has bilateral lower abdominal sharp intermittent pain with nausea without vomiting. H/x diverticulitis patient states pain and symptoms similar to past. EXAM: CT ABDOMEN AND PELVIS WITH CONTRAST TECHNIQUE: Multidetector CT imaging of the abdomen and pelvis was performed using the standard protocol following bolus administration of intravenous  contrast. CONTRAST:  45m ISOVUE-300 IOPAMIDOL (ISOVUE-300) INJECTION 61% COMPARISON:  02/06/2018 FINDINGS: Lower chest: There is scarring in both lung bases. Coronary artery calcifications are present. The heart is normal in size. Hepatobiliary: Liver is diffusely low attenuation. No focal liver lesions. Cholecystectomy. Pancreas: Unremarkable. No pancreatic ductal dilatation or surrounding inflammatory changes. Spleen: Normal in size without focal abnormality. Adrenals/Urinary Tract: Adrenal glands are normal. There is symmetric enhancement and excretion from the kidneys. 1 millimeter intrarenal calcification identified in the LOWER pole of the RIGHT kidney, not associated with obstruction. Ureters are unremarkable. The bladder and visualized portion of the urethra are normal. Stomach/Bowel: The stomach is normal in appearance. Small bowel loops are normal in caliber and wall thickness. The appendix is well seen and has a normal appearance. There is significant inflammatory change in the region of sigmoid colon and associated numerous diverticula in this segment. Fluid extends in the sigmoid mesentery. The diseased segment of sigmoid is adjacent to the uterus and the urinary bladder. No evidence for abscess or perforation. Vascular/Lymphatic: There is atherosclerotic calcification of the abdominal aorta. There is normal vascular opacification of the celiac axis, superior mesenteric artery, and inferior mesenteric artery. Normal appearance of the portal venous system and inferior vena cava. Reproductive: The uterus is present and is surrounded by inflammatory changes from the sigmoid diverticulitis. No adnexal mass. Other: Trace amount of free pelvic fluid. Musculoskeletal: Incompletely imaged fluid attenuation collection within the LEFT hip abductors, measuring at least 7.3 x 4.8 centimeters. The appearance is stable since 2017. There are degenerative changes in the lumbar spine with 7 millimeters anterolisthesis  of L4 on L5. Median sternotomy. IMPRESSION: 1. Acute sigmoid diverticulitis with significant fluid in the sigmoid mesentery. There is no evidence for perforation or abscess. 2. Coronary artery calcifications. 3. Hepatic steatosis.  Cholecystectomy. 4. Nonobstructing intrarenal calculus in the LOWER pole the RIGHT kidney. 5. Normal appendix. 6.  Aortic atherosclerosis. 7. Probably benign ganglion cyst within the LEFT hip abductors. Electronically Signed   By: ENolon NationsM.D.   On: 04/05/2018 22:06   [place]Venous Img Upper Uni Right  Result Date: 04/15/2018 CLINICAL DATA:  None EXAM: RIGHT UPPER EXTREMITY VENOUS DOPPLER ULTRASOUND TECHNIQUE: Gray-scale sonography  with graded compression, as well as color Doppler and duplex ultrasound were performed to evaluate the upper extremity deep venous system from the level of the subclavian vein and including the jugular, axillary, basilic, radial, ulnar and upper cephalic vein. Spectral Doppler was utilized to evaluate flow at rest and with distal augmentation maneuvers. COMPARISON:  None. FINDINGS: Contralateral Subclavian Vein: Respiratory phasicity is normal and symmetric with the symptomatic side. No evidence of thrombus. Normal compressibility. Internal Jugular Vein: No evidence of thrombus. Normal compressibility, respiratory phasicity and response to augmentation. Subclavian Vein: No evidence of thrombus. Normal compressibility, respiratory phasicity and response to augmentation. Axillary Vein: No evidence of thrombus. Normal compressibility, respiratory phasicity and response to augmentation. Cephalic Vein: No evidence of thrombus. Normal compressibility, respiratory phasicity and response to augmentation. Basilic Vein: No evidence of thrombus. Normal compressibility, respiratory phasicity and response to augmentation. Brachial Veins: Occlusive thrombus of the right brachial vein, paired brachial veins. Radial Veins: No evidence of thrombus. Normal  compressibility, respiratory phasicity and response to augmentation. Ulnar Veins: No evidence of thrombus. Normal compressibility, respiratory phasicity and response to augmentation. Other Findings:  Edema IMPRESSION: Sonographic survey of the right upper extremity is positive for occlusive DVT of the paired right brachial vein. Electronically Signed   By: Corrie Mckusick D.O.   On: 04/15/2018 14:17   Dg Chest Port 1 View  Result Date: 04/11/2018 CLINICAL DATA:  PICC placement. EXAM: PORTABLE CHEST 1 VIEW COMPARISON:  Chest radiograph 04/06/2018. FINDINGS: Cardiomegaly.  Mild vascular congestion.  Prior CABG. RIGHT arm PICC line tip appears to lie distal SVC. This is marked with an arrow. No pneumothorax. IMPRESSION: RIGHT arm PICC line tip appears to lie distal SVC. No pneumothorax. Cardiomegaly with mild vascular congestion, stable from priors. Electronically Signed   By: Staci Righter M.D.   On: 04/11/2018 09:34   Ct Image Guided Fluid Drain By Catheter  Result Date: 04/16/2018 INDICATION: Pelvic abscess x2 EXAM: PELVIC ABSCESS DRAIN X2 CT-GUIDED MEDICATIONS: The patient is currently admitted to the hospital and receiving intravenous antibiotics. The antibiotics were administered within an appropriate time frame prior to the initiation of the procedure. ANESTHESIA/SEDATION: Fentanyl 0 mcg IV; Versed 4 mg IV, 4 mg morphine sulfate Moderate Sedation Time:  53 minutes The patient was continuously monitored during the procedure by the interventional radiology nurse under my direct supervision. COMPLICATIONS: None immediate. PROCEDURE: Informed written consent was obtained from the patient after a thorough discussion of the procedural risks, benefits and alternatives. All questions were addressed. Maximal Sterile Barrier Technique was utilized including caps, mask, sterile gowns, sterile gloves, sterile drape, hand hygiene and skin antiseptic. A timeout was performed prior to the initiation of the procedure. In  the prone position, the right gluteal region was prepped and draped in a sterile fashion. Under CT guidance, an 18 gauge needle was advanced into the pelvic abscess and removed over an Amplatz wire. Ten [place] dilator followed by a 10 [place] drain were inserted in the fluid collection. Pus was aspirated. The patient was placed supine. The lower abdomen was prepped and draped in a sterile fashion. 1% lidocaine was utilized for local anesthesia. Under CT guidance, an 18 gauge needle was inserted into the upper pelvic fluid collection and removed over an Amplatz wire. Ten [place] dilator followed by 10 [place] drain were inserted. Cloudy yellow fluid was aspirated. FINDINGS: Images demonstrate 8 French drain placement into the 2 fluid collections described above. IMPRESSION: Successful pelvic abscess drain x2 Electronically Signed   By: Arnell Sieving  Hoss M.D.   On: 04/16/2018 16:01   Ct Image Guided Drainage By Percutaneous Catheter  Result Date: 04/24/2018 CLINICAL DATA:  Diverticular abscess EXAM: CT GUIDED DRAINAGE OF PELVIC ABSCESS ANESTHESIA/SEDATION: Intravenous Fentanyl and Versed were administered as conscious sedation during continuous monitoring of the patient's level of consciousness and physiological / cardiorespiratory status by the radiology RN, with a total moderate sedation time of 17 minutes. PROCEDURE: The procedure, risks, benefits, and alternatives were explained to the patient. Questions regarding the procedure were encouraged and answered. The patient understands and consents to the procedure. Select axial scans through the pelvis were obtained. The right lower quadrant collection was localized, an appropriate skin entry site and trajectory identified, and the skin marked. The operative field was prepped with chlorhexidinein a sterile fashion, and a sterile drape was applied covering the operative field. A sterile gown and sterile gloves were used for the procedure. Local anesthesia was provided  with 1% Lidocaine. Under CT fluoroscopic guidance, an 18 gauge trocar needle was advanced into the collection. Gas and fluid returned through the needle hub. Amplatz guidewire advanced easily into the collection, confirmed on CT. Tract dilated to facilitate placement of a 12 French pigtail catheter, formed centrally within the collection. Position confirmed on CT. 5 mL of greenish feculent aspirate were sent for Gram stain and culture. The catheter was secured externally with 0 Prolene suture and StatLock and placed to gravity drain bag. The patient tolerated the procedure well. COMPLICATIONS: None immediate FINDINGS: Loculated right lower quadrant gas and fluid collection was localized. 12 French pigtail drain catheter placed as above. IMPRESSION: 1. Technically successful CT-guided placement of right lower quadrant abscess drain catheter. Electronically Signed   By: Lucrezia Europe M.D.   On: 04/24/2018 08:28   [place] Ekg Site Rite  Result Date: 04/23/2018 If Site Rite image not attached, placement could not be confirmed due to current cardiac rhythm.    CBC Recent Labs  Lab 04/21/18 0406 04/22/18 0359 04/23/18 0329 04/24/18 0833 04/25/18 0619  WBC 7.8 7.7 9.1 8.8 8.9  HGB 8.7* 9.0* 9.0* 8.5* 8.1*  HCT 26.1* 26.9* 28.2* 26.4* 26.4*  PLT 334 358 405* 352 380  MCV 88.5 89.4 91.3 94.0 95.0  MCH 29.5 29.9 29.1 30.2 29.1  MCHC 33.3 33.5 31.9 32.2 30.7  RDW 16.2* 16.3* 16.5* 16.8* 16.7*  LYMPHSABS  --   --   --  0.4*  --   MONOABS  --   --   --  1.4*  --   EOSABS  --   --   --  0.4  --   BASOSABS  --   --   --  0.1  --     Chemistries  Recent Labs  Lab 04/19/18 0419 04/21/18 0406 04/22/18 0359 04/23/18 0329 04/24/18 0833 04/25/18 0619  NA 138 138 139 138 139 138  K 3.5 3.3* 3.3* 3.6 3.4* 3.4*  CL 100 97* 97* 94* 92* 93*  CO2 30 31 34* 33* 37* 37*  GLUCOSE 96 91 109* 97 142* 129*  BUN 20 20 17 17 20  26*  CREATININE 1.07* 1.31* 1.02* 1.11* 1.07* 1.18*  CALCIUM 7.4* 7.6* 7.7* 7.9* 7.6*  7.7*  MG 1.5*  --   --   --  0.9* 1.5*  AST  --   --   --   --  55*  --   ALT  --   --   --   --  28  --   ALKPHOS  --   --   --   --  59  --   BILITOT  --   --   --   --  0.6  --    ------------------------------------------------------------------------------------------------------------------ Recent Labs    04/24/18 0833  TRIG 113    Lab Results  Component Value Date   HGBA1C 5.9 (H) 04/07/2018   ------------------------------------------------------------------------------------------------------------------ No results for input(s): TSH, T4TOTAL, T3FREE, THYROIDAB in the last 72 hours.  Invalid input(s): FREET3 ------------------------------------------------------------------------------------------------------------------ No results for input(s): VITAMINB12, FOLATE, FERRITIN, TIBC, IRON, RETICCTPCT in the last 72 hours.  Coagulation profile Recent Labs  Lab 04/23/18 0329  INR 1.25    No results for input(s): DDIMER in the last 72 hours.  Cardiac Enzymes No results for input(s): CKMB, TROPONINI, MYOGLOBIN in the last 168 hours.  Invalid input(s): CK ------------------------------------------------------------------------------------------------------------------    Component Value Date/Time   BNP 34.0 06/14/2014 1415    Mihir Flanigan MD [facility] pgr [phone] 11/03/2017, 9:25 AM  [facility] - Office  [phone]

## 2018-04-26 ENCOUNTER — Encounter (HOSPITAL_COMMUNITY): Payer: Self-pay | Admitting: Surgery

## 2018-04-26 DIAGNOSIS — K5732 Diverticulitis of large intestine without perforation or abscess without bleeding: Secondary | ICD-10-CM

## 2018-04-26 DIAGNOSIS — D631 Anemia in chronic kidney disease: Secondary | ICD-10-CM

## 2018-04-26 LAB — GLUCOSE, CAPILLARY
GLUCOSE-CAPILLARY: 116 mg/dL — AB (ref 70–99)
Glucose-Capillary: 124 mg/dL — ABNORMAL HIGH (ref 70–99)
Glucose-Capillary: 120 mg/dL — ABNORMAL HIGH (ref 70–99)
Glucose-Capillary: 121 mg/dL — ABNORMAL HIGH (ref 70–99)
Glucose-Capillary: 138 mg/dL — ABNORMAL HIGH (ref 70–99)

## 2018-04-26 LAB — CBC
HCT: 25.5 % — ABNORMAL LOW (ref 36.0–46.0)
Hemoglobin: 8.1 g/dL — ABNORMAL LOW (ref 12.0–15.0)
MCH: 30 pg (ref 26.0–34.0)
MCHC: 31.8 g/dL (ref 30.0–36.0)
MCV: 94.4 fL (ref 80.0–100.0)
NRBC: 0.4 % — AB (ref 0.0–0.2)
PLATELETS: 351 10*3/uL (ref 150–400)
RBC: 2.7 MIL/uL — AB (ref 3.87–5.11)
RDW: 16.8 % — ABNORMAL HIGH (ref 11.5–15.5)
WBC: 8 10*3/uL (ref 4.0–10.5)

## 2018-04-26 LAB — BASIC METABOLIC PANEL
ANION GAP: 7 (ref 5–15)
BUN: 29 mg/dL — ABNORMAL HIGH (ref 8–23)
CO2: 38 mmol/L — ABNORMAL HIGH (ref 22–32)
Calcium: 7.9 mg/dL — ABNORMAL LOW (ref 8.9–10.3)
Chloride: 91 mmol/L — ABNORMAL LOW (ref 98–111)
Creatinine, Ser: 1.26 mg/dL — ABNORMAL HIGH (ref 0.44–1.00)
GFR, EST AFRICAN AMERICAN: 48 mL/min — AB (ref >60)
GFR, EST NON AFRICAN AMERICAN: 41 mL/min — AB (ref >60)
Glucose, Bld: 124 mg/dL — ABNORMAL HIGH (ref 70–99)
POTASSIUM: 3.6 mmol/L (ref 3.5–5.1)
SODIUM: 136 mmol/L (ref 135–145)

## 2018-04-26 MED ORDER — ENSURE SURGERY PO LIQD
237.0000 mL | Freq: Two times a day (BID) | ORAL | Status: DC
  Administered 2018-04-26 – 2018-04-29 (×3): 237 mL via ORAL
  Filled 2018-04-26 (×8): qty 237

## 2018-04-26 MED ORDER — OXYCODONE HCL 5 MG PO TABS
2.5000 mg | ORAL_TABLET | Freq: Four times a day (QID) | ORAL | Status: DC | PRN
  Administered 2018-04-26: 2.5 mg via ORAL
  Administered 2018-04-28 – 2018-04-29 (×4): 5 mg via ORAL
  Filled 2018-04-26 (×7): qty 1

## 2018-04-26 MED ORDER — METRONIDAZOLE IN NACL 5-0.79 MG/ML-% IV SOLN
500.0000 mg | Freq: Four times a day (QID) | INTRAVENOUS | Status: DC
  Administered 2018-04-26 – 2018-04-27 (×4): 500 mg via INTRAVENOUS
  Filled 2018-04-26 (×4): qty 100

## 2018-04-26 MED ORDER — PREGABALIN 75 MG PO CAPS
75.0000 mg | ORAL_CAPSULE | Freq: Every day | ORAL | Status: DC
  Administered 2018-04-26 – 2018-05-12 (×8): 75 mg via ORAL
  Filled 2018-04-26 (×16): qty 1

## 2018-04-26 MED ORDER — TRAVASOL 10 % IV SOLN
INTRAVENOUS | Status: AC
  Administered 2018-04-26: 18:00:00 via INTRAVENOUS
  Filled 2018-04-26: qty 720

## 2018-04-26 MED ORDER — ACETAMINOPHEN 500 MG PO TABS
1000.0000 mg | ORAL_TABLET | Freq: Three times a day (TID) | ORAL | Status: DC
  Administered 2018-04-26 – 2018-04-29 (×11): 1000 mg via ORAL
  Filled 2018-04-26 (×12): qty 2

## 2018-04-26 NOTE — Progress Notes (Signed)
PROGRESS NOTE    Vennela Jutte Kazanjian  KGU:[id number] DOB: 1945/09/08 DOA: 04/05/2018 PCP: [name], MD  Outpatient Specialists: None    Brief Narrative:  72 year old female with past medical history of hypertension, type 2 diabetes mellitus chronic kidney disease rheumatoid arthritis coronary artery disease S/P CABG on Plavix who was admitted with abdominal pain and found to have sigmoid diverticulitis on April 05, 2018 with perforation and abscess.  Initially was felt to be improving on IV Merrem but she continued to have abdominal pain a CT scan of the abdomen and pelvis was obtained April 16, 1999 and was significant for worsening diverticulitis with abdominal fluid collections.      Assessment & Plan:   Principal Problem:   Diverticulitis of large intestine with perforation and abscess Active Problems:   DM (diabetes mellitus) ([place])   HTN (hypertension)   Rheumatoid arthritis ([place])   Anemia   Renal insufficiency   Sepsis due to undetermined organism ([place])   CKD (chronic kidney disease) stage 3, GFR 30-59 ml/min ([facility])   Fibromyalgia   Acute renal failure superimposed on stage 3 chronic kidney disease ([place])    1.  Perforated sigmoid diverticulitis 2.  Sepsis code called patient improving on IV meropenem. 3.  Acute right upper extremity DVT extremity due to PICC line this was just discontinued April 16, 2018 still swollen but improving there is no warmth or redness.  She was initially on heparin now she is on Lovenox 4.  Acute kidney injury with chronic kidney disease stage III this has resolved she is back to baseline with creatinine of 1.2, creatinine is 1.6 today continue IV hydration patient is starting to take by mouth slowly she is also on TPN 5.  Fibromyalgia continue Lyrica 6.  Hypertension improved we will continue metoprolol her isosorbide on mevastatin and amlodipine hydrochlorothiazide combination are still on hold.  She still on Lasix 40 mg twice daily 7.   Type 2 diabetes mellitus well controlled on Lovenox and sliding scale 8.  Debility.  PT has been consulted to recommend SNF.  Patient stated to not be compliant with physical therapy 9.  Acute on chronic anemia she received 2 units of packed RBC current hemoglobin is 8.1 today it was 8.1 yesterday as well 10.  Depression patient recently started on Lexapro 11.  Paroxysmal atrial tachycardia.  She has not had any more episodes of PAT since that of April 22, 2018 echocardiogram that was done during this hospital stay showed preserved ejection fraction her beta-blocker was increased 12.  Hypokalemia resolved with repleted potassium    DVT prophylaxis: (Lovenox/Heparin/SCD's/anticoagulated/None (if comfort care) Code Status: (Full/Partial - specify details) Family Communication: Husband at bedside  disposition Plan: Physical therapy recommended SNF Consultants:   General surgery   interventional radiology  Procedures:Echocardiogram Drains  percutaneous  x2 drainage of the abdominal area Right arm PICC line Left arm PICC line   Antimicrobials:  Meropenem  Flagyl  Ciprofloxacin discontinued   SUBJECTIVE: Patient seen at bedside husband in the room.  She stated she is feeling much better she was able to have some breakfast she drank half a cup of orange juice little bit of her milk and some grits today.  She is still on TPN.  Objective: Vitals:   04/25/18 2122 04/26/18 0515 04/26/18 0827 04/26/18 1414  BP: 95/84 (!) 111/59 108/64 (!) 121/53  Pulse: 80 76  85  Resp:    16  Temp: (!) 97.4 F (36.3 C) 97.8 F (36.6 C)  97.8 F (36.6 C)  TempSrc: Oral Oral    SpO2: 98% 98%  92%  Weight:      Height:        Intake/Output Summary (Last 24 hours) at 04/26/2018 1659 Last data filed at 04/26/2018 1528 Gross per 24 hour  Intake 1738.41 ml  Output 1300 ml  Net 438.41 ml   Filed Weights   04/19/18 0500 04/23/18 0500 04/24/18 0500  Weight: 123 kg 118.2 kg 118.3 kg     Examination:  General exam: Appears calm and comfortable morbidly obese Respiratory system: Clear to auscultation. Respiratory effort normal. Cardiovascular system: S1 & S2 heard, RRR. No JVD, murmurs, rubs, gallops or clicks. No pedal edema. Gastrointestinal system: Abdomen is nondistended, soft and nontender. No organomegaly or masses felt. Normal bowel sounds heard.  Multiple drains Central nervous system: Alert and oriented. No focal neurological deficits. Extremities: Symmetric 5 x 5 power. Skin: No rashes, lesions or ulcers Psychiatry: Judgement and insight appear normal. Mood & affect appropriate.     Data Reviewed: I have personally reviewed following labs and imaging studies  CBC: Recent Labs  Lab 04/22/18 0359 04/23/18 0329 04/24/18 0833 04/25/18 0619 04/26/18 0405  WBC 7.7 9.1 8.8 8.9 8.0  NEUTROABS  --   --  6.4  --   --   HGB 9.0* 9.0* 8.5* 8.1* 8.1*  HCT 26.9* 28.2* 26.4* 26.4* 25.5*  MCV 89.4 91.3 94.0 95.0 94.4  PLT 358 405* 352 380 962   Basic Metabolic Panel: Recent Labs  Lab 04/22/18 0359 04/23/18 0329 04/24/18 0833 04/25/18 0619 04/26/18 0405  NA 139 138 139 138 136  K 3.3* 3.6 3.4* 3.4* 3.6  CL 97* 94* 92* 93* 91*  CO2 34* 33* 37* 37* 38*  GLUCOSE 109* 97 142* 129* 124*  BUN 17 17 20  26* 29*  CREATININE 1.02* 1.11* 1.07* 1.18* 1.26*  CALCIUM 7.7* 7.9* 7.6* 7.7* 7.9*  MG  --   --  0.9* 1.5*  --   PHOS 3.4  --  3.7  --   --    GFR: Estimated Creatinine Clearance: 51.6 mL/min (A) (by C-G formula based on SCr of 1.26 mg/dL (H)). Liver Function Tests: Recent Labs  Lab 04/24/18 0833  AST 55*  ALT 28  ALKPHOS 59  BILITOT 0.6  PROT 4.7*  ALBUMIN 1.7*   No results for input(s): LIPASE, AMYLASE in the last 168 hours. No results for input(s): AMMONIA in the last 168 hours. Coagulation Profile: Recent Labs  Lab 04/23/18 0329  INR 1.25   Cardiac Enzymes: No results for input(s): CKTOTAL, CKMB, CKMBINDEX, TROPONINI in the last 168  hours. BNP (last 3 results) No results for input(s): PROBNP in the last 8760 hours. HbA1C: No results for input(s): HGBA1C in the last 72 hours. CBG: Recent Labs  Lab 04/25/18 2341 04/26/18 0338 04/26/18 0757 04/26/18 1227 04/26/18 1630  GLUCAP 111* 124* 120* 121* 116*   Lipid Profile: Recent Labs    04/24/18 0833  TRIG 113   Thyroid Function Tests: No results for input(s): TSH, T4TOTAL, FREET4, T3FREE, THYROIDAB in the last 72 hours. Anemia Panel: No results for input(s): VITAMINB12, FOLATE, FERRITIN, TIBC, IRON, RETICCTPCT in the last 72 hours. Urine analysis:    Component Value Date/Time   COLORURINE YELLOW 04/05/2018 1924   APPEARANCEUR CLEAR 04/05/2018 1924   LABSPEC 1.021 04/05/2018 1924   PHURINE 5.0 04/05/2018 1924   GLUCOSEU NEGATIVE 04/05/2018 [place] NEGATIVE 04/05/2018 [place] NEGATIVE 04/05/2018 1924  KETONESUR NEGATIVE 04/05/2018 [place] NEGATIVE 04/05/2018 1924   UROBILINOGEN 0.2 06/20/2013 1832   NITRITE NEGATIVE 04/05/2018 1924   LEUKOCYTESUR NEGATIVE 04/05/2018 1924   Sepsis Labs: @LABRCNTIP (procalcitonin:4,lacticidven:4)  ) Recent Results (from the past 240 hour(s))  Aerobic/Anaerobic Culture (surgical/deep wound)     Status: None (Preliminary result)   Collection Time: 04/23/18  3:20 PM  Result Value Ref Range Status   Specimen Description ABSCESS RIGHT LOWER QUADRANT  Final   Special Requests   Final    Normal Performed at [facility], [place] [address] [place], [place] [address]    Gram Stain NO WBC SEEN NO ORGANISMS SEEN   Final   Culture FEW CANDIDA TROPICALIS  Final   Report Status PENDING  Incomplete         Radiology Studies: No results found.      Scheduled Meds: . acetaminophen  1,000 mg Oral TID  . Chlorhexidine Gluconate Cloth  6 each Topical Daily  . enoxaparin (LOVENOX) injection  1 mg/kg Subcutaneous Q12H  . escitalopram  20 mg Oral QHS  . feeding supplement  237 mL Oral BID  BM  . furosemide  40 mg Intravenous BID  . insulin aspart  0-15 Units Subcutaneous Q4H  . latanoprost  1 drop Both Eyes QHS  . metoprolol tartrate  37.5 mg Oral BID  . nystatin   Topical TID  . pantoprazole  40 mg Oral Daily  . pravastatin  40 mg Oral QHS  . pregabalin  75 mg Oral QHS  . sodium chloride flush  5 mL Intracatheter Q8H  . timolol  1 drop Both Eyes BID   Continuous Infusions: . meropenem (MERREM) IV 1 g (04/26/18 1001)  . metronidazole 500 mg (04/26/18 1417)  . TPN ADULT (ION)       LOS: 19 days    Time spent: 25 minutes    Cristal Deer, MD [facility] Pager [phone]  If 7PM-7AM, please contact night-coverage [email or web address] Password [email or web address] 04/26/2018, 4:59 PM

## 2018-04-26 NOTE — Progress Notes (Signed)
PHARMACY - ADULT TOTAL PARENTERAL NUTRITION CONSULT NOTE   Pharmacy Consult for TPN Indication: perforated diverticulitis  Patient Measurements: Height: 5' 5.5" (166.4 cm) Weight: 260 lb 12.9 oz (118.3 kg) IBW/kg (Calculated) : 58.15 TPN AdjBW (KG): 75.1 Body mass index is 42.74 kg/m.  Assessment: Admitted 10/26 with acute sigmoid diverticulitis without perf or abscess.   PMH: HTN, HLD, CKD3, RA, DM, CAD, CVA, CRI, fibromyalgia,morbid obesity, chronic pain, RUE DVT  GI: Acute perforated sigmoid diverticulitis with multiple abscesses. Patient has continued to have inadequate oral intake since admission. Clear liquid diet. Zero po intake noted last 24 hrs. LBM 11/15. Drain output 30. Prealbumin 6.6. RD called me 11/13 about high risk for refeeding. - po PPI  11/5: at least 3 fluid collectionsin the lower abdomen/pelvis with previous perforated diverticulitis; 11/12 CT: resolution of large pelvic abscess, minimal residual mesenteric abscess although this fluid appears to communicate with a second collection in the mesentery in the LEFT mid abdomen adjacent to a small bowel loop, and persistent large extraluminal gas collection in the RIGHT pelvis-place additional 3rd drain.  Endo: A1C 5.9. (glipizide PTA)  DM, CBGs <130 Insulin requirements in the past 24 hours: 6 units  Lytes: K 3.6 improved. Cl low, bicarb high, Mg 1.5 low. Ca adjusts to 9.7. Patient continues with refeeding  Renal:CKD3. Scr 1.26 up Pulm:  Cards: VSS on IV Lasix, metoprolol, Pravach Hepatobil: AST slightly elevated Neuro: RA, fibromyalgia, h/o CV. Lexapro, Lyrica RC:BULAGT from perforated sigmoid diverticulitis on Merrem Anticoag: RUE PICC DVT on LMWH  TPN Access: removed 11/6 for DVT,  new 04/23/18 TPN start date:04/23/18 Nutritional Goals (per RD recommendation on 04/25/18): [id number] Protein:  85-100 Fluid: 1.6-1.8L  Goal TPN rate is 80 ml/hr   Current Nutrition: Clear liquids, new TPN 11/13.    Plan:  Increase TPN to 53mL/hr. - TPN (at goal 80 ml/hr) provides 96 g of protein, 326 g of dextrose, and 38.4 g of lipids which provides 1877 kCals per day, meeting 100% of patient needs  Electrolytes in TPN: adjust for refeeding. Max Cl Add MVI, trace elements  SSI and adjust as needed Monitor TPN labs Mon/Thurs and prn   Jamira Barfuss S. Alford [place], PharmD, [place] Clinical Staff Pharmacist [phone] 04/26/2018, 10:24 AM

## 2018-04-26 NOTE — Progress Notes (Signed)
[facility] Progress Note     Subjective: CC-  Patient states that she feels ok. Continues to have some lower abdominal pain, but states that it may be a little better. Denies n/v. BM yesterday.  Ate a few bites of grits this morning, does not have much of an appetite.  Objective: Vital signs in last 24 hours: Temp:  [97.4 F (36.3 C)-97.8 F (36.6 C)] 97.8 F (36.6 C) (11/16 0515) Pulse Rate:  [76-80] 76 (11/16 0515) BP: (95-111)/(59-84) 108/64 (11/16 0827) SpO2:  [98 %] 98 % (11/16 0515) Last BM Date: 04/25/18  Intake/Output from previous day: 11/15 0701 - 11/16 0700 In: 1623.4 [I.V.:908.4; IV Piggyback:700] Out: 1430 [Urine:1400; Drains:30] Intake/Output this shift: No intake/output data recorded.  PE: Gen: Alert, NAD HEENT: EOM's intact, pupils equal and round Pulm: effort normal WNI:OEVOJ, soft, +BS, no HSM,drain x3with trace purulent drainage in bags (#1 10cc, #2 10cc, #3 10cc),TTP LLQ, no peritonitis Psych: A&Ox3 Skin: no rashes noted, warm and dry  Lab Results:  Recent Labs    04/25/18 0619 04/26/18 0405  WBC 8.9 8.0  HGB 8.1* 8.1*  HCT 26.4* 25.5*  PLT 380 351   BMET Recent Labs    04/25/18 0619 04/26/18 0405  NA 138 136  K 3.4* 3.6  CL 93* 91*  CO2 37* 38*  GLUCOSE 129* 124*  BUN 26* 29*  CREATININE 1.18* 1.26*  CALCIUM 7.7* 7.9*   PT/INR No results for input(s): LABPROT, INR in the last 72 hours. CMP     Component Value Date/Time   NA 136 04/26/2018 0405   K 3.6 04/26/2018 0405   CL 91 (L) 04/26/2018 0405   CO2 38 (H) 04/26/2018 0405   GLUCOSE 124 (H) 04/26/2018 0405   BUN 29 (H) 04/26/2018 0405   CREATININE 1.26 (H) 04/26/2018 0405   CALCIUM 7.9 (L) 04/26/2018 0405   PROT 4.7 (L) 04/24/2018 0833   ALBUMIN 1.7 (L) 04/24/2018 0833   AST 55 (H) 04/24/2018 0833   ALT 28 04/24/2018 0833   ALKPHOS 59 04/24/2018 0833   BILITOT 0.6 04/24/2018 0833   GFRNONAA 41 (L) 04/26/2018 0405   GFRAA 48 (L) 04/26/2018 0405    Lipase  No results found for: LIPASE     Studies/Results: No results found.  Anti-infectives: Anti-infectives (From admission, onward)   Start     Dose/Rate Route Frequency Ordered Stop   04/15/18 1700  meropenem (MERREM) 1 g in sodium chloride 0.9 % 100 mL IVPB     1 g 200 mL/hr over 30 Minutes Intravenous Every 8 hours 04/15/18 1534     04/09/18 2200  meropenem (MERREM) 1 g in sodium chloride 0.9 % 100 mL IVPB  Status:  Discontinued     1 g 200 mL/hr over 30 Minutes Intravenous Every 8 hours 04/09/18 1244 04/14/18 0944   04/06/18 1300  meropenem (MERREM) 1 g in sodium chloride 0.9 % 100 mL IVPB  Status:  Discontinued     1 g 200 mL/hr over 30 Minutes Intravenous Every 12 hours 04/06/18 1145 04/09/18 1244   04/06/18 1145  meropenem (MERREM) 1 g in sodium chloride 0.9 % 100 mL IVPB  Status:  Discontinued     1 g 200 mL/hr over 30 Minutes Intravenous Every 8 hours 04/06/18 1139 04/06/18 1145   04/06/18 0900  ciprofloxacin (CIPRO) IVPB 400 mg  Status:  Discontinued     400 mg 200 mL/hr over 60 Minutes Intravenous Every 12 hours 04/06/18 0732 04/06/18 1139  04/06/18 0300  metroNIDAZOLE (FLAGYL) IVPB 500 mg  Status:  Discontinued     500 mg 100 mL/hr over 60 Minutes Intravenous Every 8 hours 04/05/18 2335 04/06/18 1139   04/05/18 1930  ciprofloxacin (CIPRO) IVPB 400 mg     400 mg 200 mL/hr over 60 Minutes Intravenous  Once 04/05/18 1925 04/05/18 2217   04/05/18 1930  metroNIDAZOLE (FLAGYL) IVPB 500 mg     500 mg 100 mL/hr over 60 Minutes Intravenous  Once 04/05/18 1925 04/05/18 2102       Assessment/Plan HTN HLD 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 - on plavix (last dose 10/27) H/o CVA  Morbidly obese Chronic pain RUE DVT - on IV heparin  Acute perforated sigmoid diverticulitis with multiple abscesses - CT scan 11/5 revealsat least 3 fluid collectionsin the lower abdomen/pelvis with previous perforated diverticulitis; fluid collections are7.3 x 5.8 cm in left  lower quadrant, air-fluid collection measuring 8.2 x 5.9 cm in the right lower quadrant, and8.9 x 5.3 cm fluid collection in the pelvis in the pre rectal space which appears to contain stool and fluid -s/pIR perc drains x 2 - 04/16/18, culture with multiple organisms present/none predominant - CT scan 11/12 showed resolution of large pelvic abscess, minimal residual mesenteric abscess although this fluid appears to communicate with a second collection in the mesentery in the LEFT mid abdomen adjacent to a small bowel loop measuring 4.4 x 3.5 cm, and persistent large extraluminal gas collection in the RIGHT pelvis 8.2 x 5.0 x 6.8 cm. - S/p 3rd IR drain 11/13, culture pending  ID -currently merrem 10/27>> VTE -SCDs, IV heparin FEN -IVF, soft diet, TPN  Plan: Ok for soft diet; patient continues to have inadequate oral intake, continue TPN. Continue drains and IV antibiotics.Continue PT/mobilization. If pain persists may consider repeat CT scan 1-2 days.   LOS: 19 days    Wellington [place] , [facility] 04/26/2018, 10:11 AM Pager: [phone] Mon 7:00 am -11:30 AM Tues-Fri 7:00 am-4:30 pm Sat-Sun 7:00 am-11:30 am

## 2018-04-27 ENCOUNTER — Inpatient Hospital Stay (HOSPITAL_COMMUNITY): Payer: Medicare Other

## 2018-04-27 LAB — BASIC METABOLIC PANEL
Anion gap: 10 (ref 5–15)
BUN: 37 mg/dL — AB (ref 8–23)
CHLORIDE: 92 mmol/L — AB (ref 98–111)
CO2: 36 mmol/L — AB (ref 22–32)
Calcium: 8.3 mg/dL — ABNORMAL LOW (ref 8.9–10.3)
Creatinine, Ser: 1.49 mg/dL — ABNORMAL HIGH (ref 0.44–1.00)
GFR calc Af Amer: 39 mL/min — ABNORMAL LOW (ref >60)
GFR calc non Af Amer: 34 mL/min — ABNORMAL LOW (ref >60)
GLUCOSE: 131 mg/dL — AB (ref 70–99)
POTASSIUM: 3.6 mmol/L (ref 3.5–5.1)
Sodium: 138 mmol/L (ref 135–145)

## 2018-04-27 LAB — GLUCOSE, CAPILLARY
GLUCOSE-CAPILLARY: 140 mg/dL — AB (ref 70–99)
GLUCOSE-CAPILLARY: 151 mg/dL — AB (ref 70–99)
GLUCOSE-CAPILLARY: 141 mg/dL — AB (ref 70–99)
Glucose-Capillary: 112 mg/dL — ABNORMAL HIGH (ref 70–99)
Glucose-Capillary: 127 mg/dL — ABNORMAL HIGH (ref 70–99)
Glucose-Capillary: 139 mg/dL — ABNORMAL HIGH (ref 70–99)
Glucose-Capillary: 150 mg/dL — ABNORMAL HIGH (ref 70–99)

## 2018-04-27 LAB — MAGNESIUM: Magnesium: 1.6 mg/dL — ABNORMAL LOW (ref 1.7–2.4)

## 2018-04-27 LAB — CBC
HEMATOCRIT: 26.5 % — AB (ref 36.0–46.0)
Hemoglobin: 8 g/dL — ABNORMAL LOW (ref 12.0–15.0)
MCH: 28.7 pg (ref 26.0–34.0)
MCHC: 30.2 g/dL (ref 30.0–36.0)
MCV: 95 fL (ref 80.0–100.0)
PLATELETS: 354 10*3/uL (ref 150–400)
RBC: 2.79 MIL/uL — AB (ref 3.87–5.11)
RDW: 16.7 % — ABNORMAL HIGH (ref 11.5–15.5)
WBC: 8.9 10*3/uL (ref 4.0–10.5)
nRBC: 0.2 % (ref 0.0–0.2)

## 2018-04-27 LAB — PHOSPHORUS: Phosphorus: 3.5 mg/dL (ref 2.5–4.6)

## 2018-04-27 MED ORDER — INSULIN ASPART 100 UNIT/ML ~~LOC~~ SOLN
0.0000 [IU] | Freq: Four times a day (QID) | SUBCUTANEOUS | Status: DC
  Administered 2018-04-27: 2 [IU] via SUBCUTANEOUS
  Administered 2018-04-27: 3 [IU] via SUBCUTANEOUS
  Administered 2018-04-27: 2 [IU] via SUBCUTANEOUS
  Administered 2018-04-28: 3 [IU] via SUBCUTANEOUS
  Administered 2018-04-28 – 2018-04-29 (×4): 2 [IU] via SUBCUTANEOUS
  Administered 2018-04-29: 3 [IU] via SUBCUTANEOUS
  Administered 2018-04-29 – 2018-04-30 (×3): 2 [IU] via SUBCUTANEOUS
  Administered 2018-04-30: 11 [IU] via SUBCUTANEOUS
  Administered 2018-05-01 (×3): 5 [IU] via SUBCUTANEOUS

## 2018-04-27 MED ORDER — MAGNESIUM SULFATE 2 GM/50ML IV SOLN
2.0000 g | Freq: Once | INTRAVENOUS | Status: AC
  Administered 2018-04-27: 2 g via INTRAVENOUS
  Filled 2018-04-27: qty 50

## 2018-04-27 MED ORDER — IOHEXOL 300 MG/ML  SOLN: 30.0000 mL | Freq: Once | INTRAMUSCULAR | Status: DC | PRN

## 2018-04-27 MED ORDER — TRAVASOL 10 % IV SOLN
INTRAVENOUS | Status: AC
  Administered 2018-04-27: 18:00:00 via INTRAVENOUS
  Filled 2018-04-27: qty 960

## 2018-04-27 MED ORDER — SODIUM CHLORIDE 0.9 % IV SOLN
1.0000 g | Freq: Two times a day (BID) | INTRAVENOUS | Status: DC
  Administered 2018-04-27 – 2018-05-01 (×7): 1 g via INTRAVENOUS
  Filled 2018-04-27 (×9): qty 1

## 2018-04-27 NOTE — Progress Notes (Signed)
ANTICOAGULATION CONSULT NOTE - Follow Up Consult  Pharmacy Consult for Enoxaparin Indication: DVT  Allergies  Allergen Reactions  . Fentanyl Anaphylaxis and Shortness Of Breath  . Lactose Intolerance (Gi) Other (See Comments)    G.I. Upset  . Butrans [Buprenorphine] Rash and Other (See Comments)    Infected skin underneath application  . Penicillins Rash    Facial rash Has patient had a PCN reaction causing immediate rash, facial/tongue/throat swelling, SOB or lightheadedness with hypotension: Yes Has patient had a PCN reaction causing severe rash involving mucus membranes or skin necrosis: No Has patient had a PCN reaction that required hospitalization No Has patient had a PCN reaction occurring within the last 10 years: Yes If all of the above answers are "NO", then may proceed with Cephalosporin use.   . Simvastatin Rash     Labs: Recent Labs    04/25/18 0619 04/26/18 0405 04/27/18 0321  HGB 8.1* 8.1* 8.0*  HCT 26.4* 25.5* 26.5*  PLT 380 351 354  HEPARINUNFRC 0.88*  --   --   CREATININE 1.18* 1.26* 1.49*    Estimated Creatinine Clearance: 43.6 mL/min (A) (by C-G formula based on SCr of 1.49 mg/dL (H)).  Assessment: 72 yo F with acute RUE DVT from PICC. IV heparin changed to enoxaparin. No bleeding noted. Hgb and PLT stable.  .   Goal of Therapy:  Monitor platelets by anticoagulation protocol: Yes   Plan:  Continue enoxaparin 1mg /kg SQ q12h F/u transition to [place] after possible procedures   Lucyann Romano A. Levada Dy, PharmD, [place] Pager: [phone] Please utilize Amion for appropriate phone number to reach the unit pharmacist ([place])   04/27/2018 10:12 AM

## 2018-04-27 NOTE — Progress Notes (Signed)
[facility] Progress Note     Subjective: CC-  Up in chair, husband at bedside. States that when she was moved from bed to chair she started having more abdominal pain. Prior to this feeling about the same as yesterday. Denies n/v. Appetite suppressed. She at 25% of dinner and 0% of breakfast.  Objective: Vital signs in last 24 hours: Temp:  [97.4 F (36.3 C)-98 F (36.7 C)] 97.4 F (36.3 C) (11/17 0454) Pulse Rate:  [60-85] 60 (11/17 0853) Resp:  [16-17] 17 (11/17 0454) BP: (104-126)/(47-78) 126/78 (11/17 0851) SpO2:  [92 %-100 %] 100 % (11/17 0454) Last BM Date: 04/26/18  Intake/Output from previous day: 11/16 0701 - 11/17 0700 In: 1438.8 [I.V.:613.8; IV Piggyback:800] Out: 1130 [Urine:1100; Drains:30] Intake/Output this shift: No intake/output data recorded.  PE: Gen: Alert, NAD HEENT: EOM's intact, pupils equal and round Pulm: effort normal SHF:WYOVZ, soft, +BS, no HSM, TTP LLQ/ no peritonitis,drain x3withtracepurulent drainagein bags (I can only see 2 of 3 drains today) - #1 0cc, #2 0cc, #3 30cc Psych: A&Ox3 Skin: no rashes noted, warm and dry  Lab Results:  Recent Labs    04/26/18 0405 04/27/18 0321  WBC 8.0 8.9  HGB 8.1* 8.0*  HCT 25.5* 26.5*  PLT 351 354   BMET Recent Labs    04/26/18 0405 04/27/18 0321  NA 136 138  K 3.6 3.6  CL 91* 92*  CO2 38* 36*  GLUCOSE 124* 131*  BUN 29* 37*  CREATININE 1.26* 1.49*  CALCIUM 7.9* 8.3*   PT/INR No results for input(s): LABPROT, INR in the last 72 hours. CMP     Component Value Date/Time   NA 138 04/27/2018 0321   K 3.6 04/27/2018 0321   CL 92 (L) 04/27/2018 0321   CO2 36 (H) 04/27/2018 0321   GLUCOSE 131 (H) 04/27/2018 0321   BUN 37 (H) 04/27/2018 0321   CREATININE 1.49 (H) 04/27/2018 0321   CALCIUM 8.3 (L) 04/27/2018 0321   PROT 4.7 (L) 04/24/2018 0833   ALBUMIN 1.7 (L) 04/24/2018 0833   AST 55 (H) 04/24/2018 0833   ALT 28 04/24/2018 0833   ALKPHOS 59 04/24/2018 0833   BILITOT 0.6 04/24/2018 0833   GFRNONAA 34 (L) 04/27/2018 0321   GFRAA 39 (L) 04/27/2018 0321   Lipase  No results found for: LIPASE     Studies/Results: No results found.  Anti-infectives: Anti-infectives (From admission, onward)   Start     Dose/Rate Route Frequency Ordered Stop   04/27/18 1500  meropenem (MERREM) 1 g in sodium chloride 0.9 % 100 mL IVPB     1 g 200 mL/hr over 30 Minutes Intravenous Every 12 hours 04/27/18 1008     04/26/18 1300  metroNIDAZOLE (FLAGYL) IVPB 500 mg     500 mg 100 mL/hr over 60 Minutes Intravenous Every 6 hours 04/26/18 1257     04/15/18 1700  meropenem (MERREM) 1 g in sodium chloride 0.9 % 100 mL IVPB  Status:  Discontinued     1 g 200 mL/hr over 30 Minutes Intravenous Every 8 hours 04/15/18 1534 04/27/18 1008   04/09/18 2200  meropenem (MERREM) 1 g in sodium chloride 0.9 % 100 mL IVPB  Status:  Discontinued     1 g 200 mL/hr over 30 Minutes Intravenous Every 8 hours 04/09/18 1244 04/14/18 0944   04/06/18 1300  meropenem (MERREM) 1 g in sodium chloride 0.9 % 100 mL IVPB  Status:  Discontinued     1 g 200 mL/hr over  30 Minutes Intravenous Every 12 hours 04/06/18 1145 04/09/18 1244   04/06/18 1145  meropenem (MERREM) 1 g in sodium chloride 0.9 % 100 mL IVPB  Status:  Discontinued     1 g 200 mL/hr over 30 Minutes Intravenous Every 8 hours 04/06/18 1139 04/06/18 1145   04/06/18 0900  ciprofloxacin (CIPRO) IVPB 400 mg  Status:  Discontinued     400 mg 200 mL/hr over 60 Minutes Intravenous Every 12 hours 04/06/18 0732 04/06/18 1139   04/06/18 0300  metroNIDAZOLE (FLAGYL) IVPB 500 mg  Status:  Discontinued     500 mg 100 mL/hr over 60 Minutes Intravenous Every 8 hours 04/05/18 2335 04/06/18 1139   04/05/18 1930  ciprofloxacin (CIPRO) IVPB 400 mg     400 mg 200 mL/hr over 60 Minutes Intravenous  Once 04/05/18 1925 04/05/18 2217   04/05/18 1930  metroNIDAZOLE (FLAGYL) IVPB 500 mg     500 mg 100 mL/hr over 60 Minutes Intravenous  Once 04/05/18  1925 04/05/18 2102       Assessment/Plan HTN HLD 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 - on plavix (last dose 10/27) H/o CVA  Morbidly obese Chronic pain RUE DVT - on IV heparin  Acute perforated sigmoid diverticulitis with multiple abscesses - CT scan 11/5 revealsat least 3 fluid collectionsin the lower abdomen/pelvis with previous perforated diverticulitis; fluid collections are7.3 x 5.8 cm in left lower quadrant, air-fluid collection measuring 8.2 x 5.9 cm in the right lower quadrant, and8.9 x 5.3 cm fluid collection in the pelvis in the pre rectal space which appears to contain stool and fluid -s/pIR perc drains x 2 - 04/16/18, culture with multiple organisms present/none predominant - CT scan 11/12 showed resolution of large pelvic abscess, minimal residual mesenteric abscess although this fluid appears to communicate with a second collection in the mesentery in the LEFT mid abdomen adjacent to a small bowel loop measuring 4.4 x 3.5 cm, and persistent large extraluminal gas collection in the RIGHT pelvis 8.2 x 5.0 x 6.8 cm. -S/p3rdIR drain 11/13, culture pending  ID -currently merrem 10/27>>, flagyl 11/16>> VTE -SCDs, IV heparin FEN -IVF,soft diet, TPN  Plan: Repeat CT scan tomorrow. Continue soft diet and TPN as she is not taking in enough nutrition PO. Mobilize. Continue drain and abx.   LOS: 20 days    Wellington Hampshire , [facility] 04/27/2018, 10:38 AM Pager: [phone] Mon 7:00 am -11:30 AM Tues-Fri 7:00 am-4:30 pm Sat-Sun 7:00 am-11:30 am

## 2018-04-27 NOTE — Progress Notes (Signed)
PHARMACY - ADULT TOTAL PARENTERAL NUTRITION CONSULT NOTE   Pharmacy Consult for TPN Indication: perforated diverticulitis  Patient Measurements: Height: 5' 5.5" (166.4 cm) Weight: 260 lb 12.9 oz (118.3 kg) IBW/kg (Calculated) : 58.15 TPN AdjBW (KG): 75.1 Body mass index is 42.74 kg/m.  Assessment: Admitted 10/26 with acute sigmoid diverticulitis without perf or abscess.   PMH: HTN, HLD, CKD3, RA, DM, CAD, CVA, CRI, fibromyalgia,morbid obesity, chronic pain, RUE DVT  GI: Acute perforated sigmoid diverticulitis with multiple abscesses. Patient has continued to have inadequate oral intake since admission. Clear liquid diet. Zero po intake noted last 24 hrs. LBM 11/16. Drain output 30. Prealbumin 6.6. RD called me 11/13 about high risk for refeeding. Ensure Surgery charted x 1 on 11/16. - po PPI  11/5: at least 3 fluid collectionsin the lower abdomen/pelvis with previous perforated diverticulitis; 11/12 CT: resolution of large pelvic abscess, minimal residual mesenteric abscess although this fluid appears to communicate with a second collection in the mesentery in the LEFT mid abdomen adjacent to a small bowel loop, and persistent large extraluminal gas collection in the RIGHT pelvis-place additional 3rd drain.  Endo: A1C 5.9. (glipizide PTA)  DM, CBGs <140 Insulin requirements in the past 24 hours: 8 units  Lytes: K 3.6 improved. Cl low, bicarb high, Mg 1.6 still low. Ca adjusts to 10.1. Refeeding improving.  Renal:CKD3. Scr 1.49 up again. BUN 37 up. (UOP 0.44ml/kg/hr) Pulm: [place] Cards: VSS on IV Lasix, metoprolol, Pravach Hepatobil: AST slightly elevated Neuro: RA, fibromyalgia, h/o CVA. Scheduled tylenol, Lexapro, Lyrica LP:[id number] from perforated sigmoid diverticulitis on Merrem, IV Flagyl Anticoag: RUE PICC DVT on LMWH  TPN Access: removed 11/6 for DVT,  new 04/23/18 TPN start date:04/23/18 Nutritional Goals (per RD recommendation on 04/25/18): [id number] Protein:   85-100 Fluid: 1.6-1.8L  Goal TPN rate is 80 ml/hr   Current Nutrition: Clear liquids, new TPN 11/13.   Plan:  Increase TPN to 41mL/hr. - TPN (at goal 80 ml/hr) provides 96 g of protein, 326 g of dextrose, and 38.4 g of lipids which provides 1877 kCals per day, meeting 100% of patient needs  Electrolytes in TPN: adjust for refeeding. Max Cl, (will get extra Mg and K+ with increased rate). Decrease Ca slightly. Add MVI, trace elements  SSI and adjust as needed. Change to every 6 hrs. Monitor TPN labs Mon/Thurs and prn Magnesium 2g IV x 1 today  Capone Schwinn S. Alford Highland, PharmD, [place] Clinical Staff Pharmacist [phone] 04/27/2018, 9:23 AM

## 2018-04-27 NOTE — Progress Notes (Signed)
PROGRESS NOTE    [name]  TOI:[id number] DOB: 21-Mar-1946 DOA: 04/05/2018 PCP: [name], MD  Outpatient Specialists: None    Brief Narrative:  72 year old female with past medical history of hypertension, type 2 diabetes mellitus chronic kidney disease rheumatoid arthritis coronary artery disease S/P CABG on Plavix who was admitted with abdominal pain and found to have sigmoid diverticulitis on April 05, 2018 with perforation and abscess.  Initially was felt to be improving on IV Merrem but she continued to have abdominal pain a CT scan of the abdomen and pelvis was obtained April 15, 2018 and was significant for worsening diverticulitis with abdominal fluid collections.      Assessment & Plan:   Principal Problem:   Diverticulitis of large intestine with perforation and abscess Active Problems:   DM (diabetes mellitus) ([place])   HTN (hypertension)   Rheumatoid arthritis ([place])   Anemia   Renal insufficiency   Sepsis due to undetermined organism ([place])   CKD (chronic kidney disease) stage 3, GFR 30-59 ml/min (HCC)   Fibromyalgia   Acute renal failure superimposed on stage 3 chronic kidney disease ([place])    1.  Perforated sigmoid diverticulitis 2.  Sepsis code called patient improving on IV meropenem. 3.  Acute right upper extremity DVT extremity due to PICC line this was just discontinued April 16, 2018 still swollen but improving there is no warmth or redness.  She was initially on heparin now she is on Lovenox 4.  Acute kidney injury with chronic kidney disease stage III this has resolved she is back to baseline with creatinine of 1.2, creatinine is 1.6 today continue IV hydration patient is starting to take by mouth slowly she is also on TPN 5.  Fibromyalgia continue Lyrica 6.  Hypertension improved we will continue metoprolol her isosorbide on mevastatin and amlodipine hydrochlorothiazide combination are still on hold.  She still on Lasix 40 mg twice daily 7.   Type 2 diabetes mellitus well controlled on Lovenox and sliding scale 8.  Debility.  PT has been consulted to recommend SNF.  Patient stated to not be compliant with physical therapy 9.  Acute on chronic anemia she received 2 units of packed RBC current hemoglobin is 8.1 today it was 8.1 yesterday as well 10.  Depression patient recently started on Lexapro 11.  Paroxysmal atrial tachycardia.  She has not had any more episodes of PAT since that of April 22, 2018 echocardiogram that was done during this hospital stay showed preserved ejection fraction her beta-blocker was increased 12.  Hypokalemia resolved with repleted potassium    DVT prophylaxis: (Lovenox/Heparin/SCD's/anticoagulated/None (if comfort care) Code Status: (Full/Partial - specify details) Family Communication: Husband at bedside  disposition Plan: Physical therapy recommended SNF Consultants:   General surgery   interventional radiology  Procedures:Echocardiogram Drains  percutaneous  x2 drainage of the abdominal area Right arm PICC line Left arm PICC line   Antimicrobials:  Meropenem  Flagyl  Ciprofloxacin discontinued   SUBJECTIVE: Patient seen at bedside husband in the room.  She stated she is feeling much better she was able to have some breakfast she drank half a cup of orange juice little bit of her milk and some grits today.  She is still on TPN.   April 27, 2018: Patient seen and examined at bedside with her husband in the room.  He complained that she was fed solid food which included string beans and arise but she threw it up.  She did a little better yesterday  when she had more liquid food like rates juice and milk.  She still on TPN and IV fluid.   Objective: Vitals:   04/27/18 0851 04/27/18 0853 04/27/18 1318 04/27/18 2005  BP: 126/78  136/66 (!) 104/59  Pulse: 60 60 80 81  Resp:   16 16  Temp:   98.3 F (36.8 C) 98.2 F (36.8 C)  TempSrc:   Oral Oral  SpO2:   96% 98%  Weight:       Height:        Intake/Output Summary (Last 24 hours) at 04/27/2018 2117 Last data filed at 04/27/2018 1705 Gross per 24 hour  Intake 1088.77 ml  Output 1425 ml  Net -336.23 ml   Filed Weights   04/19/18 0500 04/23/18 0500 04/24/18 0500  Weight: 123 kg 118.2 kg 118.3 kg    Examination:  General exam: Appears calm and comfortable morbidly obese she is lying in bed eyes closed responsive to being spoken to Respiratory system: Clear to auscultation. Respiratory effort normal. Cardiovascular system: S1 & S2 heard, RRR. No JVD, murmurs, rubs, gallops or clicks. No pedal edema. Gastrointestinal system: Abdomen is nondistended, soft and nontender. No organomegaly or masses felt. Normal bowel sounds heard.  Multiple drains Central nervous system: Alert and oriented. No focal neurological deficits. Extremities: Symmetric 5 x 5 power. Skin: No rashes, lesions or ulcers Psychiatry: Judgement and insight appear normal. Mood & affect appropriate.     Data Reviewed: I have personally reviewed following labs and imaging studies  CBC: Recent Labs  Lab 04/23/18 0329 04/24/18 0833 04/25/18 0619 04/26/18 0405 04/27/18 0321  WBC 9.1 8.8 8.9 8.0 8.9  NEUTROABS  --  6.4  --   --   --   HGB 9.0* 8.5* 8.1* 8.1* 8.0*  HCT 28.2* 26.4* 26.4* 25.5* 26.5*  MCV 91.3 94.0 95.0 94.4 95.0  PLT 405* 352 380 351 863   Basic Metabolic Panel: Recent Labs  Lab 04/22/18 0359 04/23/18 0329 04/24/18 0833 04/25/18 0619 04/26/18 0405 04/27/18 0321  NA 139 138 139 138 136 138  K 3.3* 3.6 3.4* 3.4* 3.6 3.6  CL 97* 94* 92* 93* 91* 92*  CO2 34* 33* 37* 37* 38* 36*  GLUCOSE 109* 97 142* 129* 124* 131*  BUN 17 17 20  26* 29* 37*  CREATININE 1.02* 1.11* 1.07* 1.18* 1.26* 1.49*  CALCIUM 7.7* 7.9* 7.6* 7.7* 7.9* 8.3*  MG  --   --  0.9* 1.5*  --  1.6*  PHOS 3.4  --  3.7  --   --  3.5   GFR: Estimated Creatinine Clearance: 43.6 mL/min (A) (by C-G formula based on SCr of 1.49 mg/dL (H)). Liver Function  Tests: Recent Labs  Lab 04/24/18 0833  AST 55*  ALT 28  ALKPHOS 59  BILITOT 0.6  PROT 4.7*  ALBUMIN 1.7*   No results for input(s): LIPASE, AMYLASE in the last 168 hours. No results for input(s): AMMONIA in the last 168 hours. Coagulation Profile: Recent Labs  Lab 04/23/18 0329  INR 1.25   Cardiac Enzymes: No results for input(s): CKTOTAL, CKMB, CKMBINDEX, TROPONINI in the last 168 hours. BNP (last 3 results) No results for input(s): PROBNP in the last 8760 hours. HbA1C: No results for input(s): HGBA1C in the last 72 hours. CBG: Recent Labs  Lab 04/27/18 0423 04/27/18 0749 04/27/18 1208 04/27/18 1622 04/27/18 1959  GLUCAP 139* 140* 151* 141* 150*   Lipid Profile: No results for input(s): CHOL, HDL, LDLCALC, TRIG, CHOLHDL, LDLDIRECT in the last  72 hours. Thyroid Function Tests: No results for input(s): TSH, T4TOTAL, FREET4, T3FREE, THYROIDAB in the last 72 hours. Anemia Panel: No results for input(s): VITAMINB12, FOLATE, FERRITIN, TIBC, IRON, RETICCTPCT in the last 72 hours. Urine analysis:    Component Value Date/Time   COLORURINE YELLOW 04/05/2018 1924   APPEARANCEUR CLEAR 04/05/2018 1924   LABSPEC 1.021 04/05/2018 1924   PHURINE 5.0 04/05/2018 1924   GLUCOSEU NEGATIVE 04/05/2018 1924   HGBUR NEGATIVE 04/05/2018 1924   BILIRUBINUR NEGATIVE 04/05/2018 [place] NEGATIVE 04/05/2018 1924   PROTEINUR NEGATIVE 04/05/2018 1924   UROBILINOGEN 0.2 06/20/2013 1832   NITRITE NEGATIVE 04/05/2018 1924   LEUKOCYTESUR NEGATIVE 04/05/2018 1924   Sepsis Labs: @LABRCNTIP (procalcitonin:4,lacticidven:4)  ) Recent Results (from the past 240 hour(s))  Aerobic/Anaerobic Culture (surgical/deep wound)     Status: None (Preliminary result)   Collection Time: 04/23/18  3:20 PM  Result Value Ref Range Status   Specimen Description ABSCESS RIGHT LOWER QUADRANT  Final   Special Requests   Final    Normal Performed at [facility], [address] [place],  [place] [address]    Gram Stain NO WBC SEEN NO ORGANISMS SEEN   Final   Culture   Final    FEW CANDIDA TROPICALIS NO ANAEROBES ISOLATED; CULTURE IN PROGRESS FOR 5 DAYS    Report Status PENDING  Incomplete         Radiology Studies: No results found.      Scheduled Meds: . acetaminophen  1,000 mg Oral TID  . Chlorhexidine Gluconate Cloth  6 each Topical Daily  . enoxaparin (LOVENOX) injection  1 mg/kg Subcutaneous Q12H  . escitalopram  20 mg Oral QHS  . feeding supplement  237 mL Oral BID BM  . furosemide  40 mg Intravenous BID  . insulin aspart  0-15 Units Subcutaneous Q6H  . latanoprost  1 drop Both Eyes QHS  . metoprolol tartrate  37.5 mg Oral BID  . nystatin   Topical TID  . pantoprazole  40 mg Oral Daily  . pravastatin  40 mg Oral QHS  . pregabalin  75 mg Oral QHS  . sodium chloride flush  5 mL Intracatheter Q8H  . timolol  1 drop Both Eyes BID   Continuous Infusions: . meropenem (MERREM) IV 1 g (04/27/18 1510)  . TPN ADULT (ION) 80 mL/hr at 04/27/18 1739     LOS: 20 days    Time spent: 25 minutes    Cristal Deer, MD [facility] Pager [phone]  If 7PM-7AM, please contact night-coverage [email or web address] Password [facility] 04/27/2018, 9:17 PM

## 2018-04-27 NOTE — Progress Notes (Signed)
Pharmacy Antibiotic Note  [name] is a 72 y.o. female admitted on 04/05/2018 with Sepsis with pneumoperitoneum secondary to worsening perforated sigmoid diverticulitis with multiple abdominal fluid collections and multiple abscesses.  Pharmacy dosing Merrem dosing.  Sepsis with pneumoperitoneum secondary to worsening perforated sigmoid diverticulitis with multiple abdominal fluid collections and multiple abscesses.2 drains placed 11/6.-resumed Merrem.  SCr increased to 1.49, CrCl ~ 104mL/min, AF, WBC wnl, now s/p additional drain   Cipro 10/26 >> 10/27 Flagyl 10/26 >> 10/27 Merrem 10/27>>11/4,  11/5 >>  10/26 BCx: negative 11/13: Pelvic abscess>> no growth 11/6: Pelvic abscess >>Rare clostridium/peptostreptococcus 11/6: Pelvic abscess 2: multiple organisms>>none predominant 10/28 MRSA PCR: neg  Plan: Decrease meropenem to 1g IV every 12 hours due to decreased renal fxn.  Would d/c flagyl as meropenem has anaerobic coverage.  F/u Cx updates and LOT, renal function  Height: 5' 5.5" (166.4 cm) Weight: 260 lb 12.9 oz (118.3 kg) IBW/kg (Calculated) : 58.15  Temp (24hrs), Avg:97.7 F (36.5 C), Min:97.4 F (36.3 C), Max:98 F (36.7 C)  Recent Labs  Lab 04/23/18 0329 04/24/18 0833 04/25/18 0619 04/26/18 0405 04/27/18 0321  WBC 9.1 8.8 8.9 8.0 8.9  CREATININE 1.11* 1.07* 1.18* 1.26* 1.49*    Estimated Creatinine Clearance: 43.6 mL/min (A) (by C-G formula based on SCr of 1.49 mg/dL (H)).    Allergies  Allergen Reactions  . Fentanyl Anaphylaxis and Shortness Of Breath  . Lactose Intolerance (Gi) Other (See Comments)    G.I. Upset  . Butrans [Buprenorphine] Rash and Other (See Comments)    Infected skin underneath application  . Penicillins Rash    Facial rash Has patient had a PCN reaction causing immediate rash, facial/tongue/throat swelling, SOB or lightheadedness with hypotension: Yes Has patient had a PCN reaction causing severe rash involving mucus membranes or  skin necrosis: No Has patient had a PCN reaction that required hospitalization No Has patient had a PCN reaction occurring within the last 10 years: Yes If all of the above answers are "NO", then may proceed with Cephalosporin use.   . Simvastatin Rash    Avanish Cerullo A. Levada Dy, PharmD, [place] Pager: [phone] Please utilize Amion for appropriate phone number to reach the unit pharmacist ([place])   04/27/2018 10:04 AM

## 2018-04-28 DIAGNOSIS — K631 Perforation of intestine (nontraumatic): Secondary | ICD-10-CM

## 2018-04-28 DIAGNOSIS — K5720 Diverticulitis of large intestine with perforation and abscess without bleeding: Secondary | ICD-10-CM

## 2018-04-28 DIAGNOSIS — N183 Chronic kidney disease, stage 3 (moderate): Secondary | ICD-10-CM

## 2018-04-28 DIAGNOSIS — D631 Anemia in chronic kidney disease: Secondary | ICD-10-CM

## 2018-04-28 DIAGNOSIS — I10 Essential (primary) hypertension: Secondary | ICD-10-CM

## 2018-04-28 DIAGNOSIS — A419 Sepsis, unspecified organism: Secondary | ICD-10-CM

## 2018-04-28 DIAGNOSIS — E1169 Type 2 diabetes mellitus with other specified complication: Secondary | ICD-10-CM

## 2018-04-28 DIAGNOSIS — N179 Acute kidney failure, unspecified: Secondary | ICD-10-CM

## 2018-04-28 DIAGNOSIS — K5732 Diverticulitis of large intestine without perforation or abscess without bleeding: Secondary | ICD-10-CM

## 2018-04-28 LAB — CBC WITH DIFFERENTIAL/PLATELET
Abs Immature Granulocytes: 0.06 10*3/uL (ref 0.00–0.07)
BASOS PCT: 1 %
Basophils Absolute: 0.1 10*3/uL (ref 0.0–0.1)
EOS ABS: 0.4 10*3/uL (ref 0.0–0.5)
EOS PCT: 4 %
HEMATOCRIT: 24.9 % — AB (ref 36.0–46.0)
Hemoglobin: 7.7 g/dL — ABNORMAL LOW (ref 12.0–15.0)
Immature Granulocytes: 1 %
LYMPHS ABS: 0.7 10*3/uL (ref 0.7–4.0)
Lymphocytes Relative: 7 %
MCH: 28.9 pg (ref 26.0–34.0)
MCHC: 30.9 g/dL (ref 30.0–36.0)
MCV: 93.6 fL (ref 80.0–100.0)
MONOS PCT: 13 %
Monocytes Absolute: 1.3 10*3/uL — ABNORMAL HIGH (ref 0.1–1.0)
NEUTROS PCT: 74 %
Neutro Abs: 7.7 10*3/uL (ref 1.7–7.7)
PLATELETS: 348 10*3/uL (ref 150–400)
RBC: 2.66 MIL/uL — ABNORMAL LOW (ref 3.87–5.11)
RDW: 16.6 % — AB (ref 11.5–15.5)
WBC: 10.2 10*3/uL (ref 4.0–10.5)
nRBC: 0.3 % — ABNORMAL HIGH (ref 0.0–0.2)

## 2018-04-28 LAB — AEROBIC/ANAEROBIC CULTURE (SURGICAL/DEEP WOUND): GRAM STAIN: NONE SEEN

## 2018-04-28 LAB — COMPREHENSIVE METABOLIC PANEL
ALBUMIN: 1.7 g/dL — AB (ref 3.5–5.0)
ALK PHOS: 54 U/L (ref 38–126)
ALT: 17 U/L (ref 0–44)
AST: 30 U/L (ref 15–41)
Anion gap: 8 (ref 5–15)
BILIRUBIN TOTAL: 0.4 mg/dL (ref 0.3–1.2)
BUN: 44 mg/dL — ABNORMAL HIGH (ref 8–23)
CO2: 34 mmol/L — ABNORMAL HIGH (ref 22–32)
Calcium: 8.2 mg/dL — ABNORMAL LOW (ref 8.9–10.3)
Chloride: 95 mmol/L — ABNORMAL LOW (ref 98–111)
Creatinine, Ser: 1.42 mg/dL — ABNORMAL HIGH (ref 0.44–1.00)
GFR calc Af Amer: 41 mL/min — ABNORMAL LOW (ref >60)
GFR calc non Af Amer: 36 mL/min — ABNORMAL LOW (ref >60)
GLUCOSE: 142 mg/dL — AB (ref 70–99)
Potassium: 3.3 mmol/L — ABNORMAL LOW (ref 3.5–5.1)
Sodium: 137 mmol/L (ref 135–145)
TOTAL PROTEIN: 4.5 g/dL — AB (ref 6.5–8.1)

## 2018-04-28 LAB — PHOSPHORUS: PHOSPHORUS: 3.9 mg/dL (ref 2.5–4.6)

## 2018-04-28 LAB — AEROBIC/ANAEROBIC CULTURE W GRAM STAIN (SURGICAL/DEEP WOUND): Special Requests: NORMAL

## 2018-04-28 LAB — GLUCOSE, CAPILLARY
Glucose-Capillary: 143 mg/dL — ABNORMAL HIGH (ref 70–99)
Glucose-Capillary: 181 mg/dL — ABNORMAL HIGH (ref 70–99)
Glucose-Capillary: 150 mg/dL — ABNORMAL HIGH (ref 70–99)

## 2018-04-28 LAB — TRIGLYCERIDES: TRIGLYCERIDES: 113 mg/dL (ref <150)

## 2018-04-28 LAB — MAGNESIUM: Magnesium: 1.6 mg/dL — ABNORMAL LOW (ref 1.7–2.4)

## 2018-04-28 LAB — PREALBUMIN: PREALBUMIN: 7.3 mg/dL — AB (ref 18–38)

## 2018-04-28 MED ORDER — POTASSIUM CHLORIDE 10 MEQ/50ML IV SOLN
10.0000 meq | INTRAVENOUS | Status: AC
  Administered 2018-04-28 (×4): 10 meq via INTRAVENOUS
  Filled 2018-04-28 (×4): qty 50

## 2018-04-28 MED ORDER — MAGNESIUM SULFATE 2 GM/50ML IV SOLN
2.0000 g | Freq: Once | INTRAVENOUS | Status: AC
  Administered 2018-04-28: 2 g via INTRAVENOUS
  Filled 2018-04-28: qty 50

## 2018-04-28 MED ORDER — TRAVASOL 10 % IV SOLN
INTRAVENOUS | Status: AC
  Administered 2018-04-28: 17:00:00 via INTRAVENOUS
  Filled 2018-04-28: qty 960

## 2018-04-28 NOTE — Progress Notes (Signed)
Referring Physician(s): Dr Dyann Ruddle  Supervising Physician: Daryll Brod  Patient Status:  [facility] - In-pt  Chief Complaint:  3 drains in place per IR TG and anterior drain placed 11/6 New RLQ abs drain placed 11/13  Subjective:  Some better Still sore TG drain especially Eating better  CT yesterday:  IMPRESSION: 1. There are 3 drainage catheters in the lower abdomen and pelvis. No significant fluid around any of the drainage catheters. However, there are 2 remaining fluid collections/abscesses. The first is in the region of the distal third portion of the duodenum measuring up to 2.8 cm. Duodenum appears to run through or immediately adjacent to this fluid. Another pocket of fluid is seen in the left lower quadrant of the abdomen measuring 4.8 x 3.7 cm. 2. Nonobstructive stone in the lower pole the right kidney. 3. Thickening remains in the sigmoid colon. Low-grade remaining inflammation of the sigmoid colon from diverticulitis is not excluded.  Anterior drains with minimal OP-- cloudy TG drain fecelant Discussed with Dr Annamaria Boots new CT findings No collection is large enough for any new drains at this time  Will inject 2 anterior drain s for poss pull today or tomorrow TG drain - no inj- no pull at this time  Allergies: Fentanyl; Lactose intolerance (gi); Butrans [buprenorphine]; Penicillins; and Simvastatin  Medications: Prior to Admission medications   Medication Sig Start Date End Date Taking? Authorizing Provider  Blood Glucose Monitoring Suppl (FREESTYLE LITE) DEVI See admin instructions. 12/22/14  Yes [provider]  ciprofloxacin (CIPRO) 500 MG/5ML (10%) suspension Take by mouth 2 (two) times daily.   Yes [provider]  clopidogrel (PLAVIX) 75 MG tablet Take 1 tablet (75 mg total) by mouth daily with breakfast. 01/14/18  Yes Rehman, Mechele Dawley, MD  Dexlansoprazole 30 MG capsule Take 40 mg by mouth daily.   Yes [provider]    diclofenac sodium (VOLTAREN) 1 % GEL Apply 2 g topically 4 (four) times daily as needed (Pain).    Yes [provider]  glipiZIDE (GLUCOTROL) 10 MG tablet Take 10 mg by mouth daily before breakfast.   Yes [provider]  HYDROcodone-acetaminophen (NORCO) 10-325 MG tablet Take 1 tablet by mouth 4 (four) times daily.   Yes [provider]  IRON PO Take 1 tablet by mouth daily.   Yes [provider]  isosorbide dinitrate (ISORDIL) 5 MG tablet Take 1 tablet (5 mg total) by mouth 3 (three) times daily before meals. 02/07/18  Yes Rehman, Mechele Dawley, MD  leflunomide (ARAVA) 20 MG tablet Take 20 mg by mouth daily.   Yes [provider]  metoprolol succinate (TOPROL-XL) 25 MG 24 hr tablet Take 25 mg by mouth daily.   Yes [provider]  metroNIDAZOLE (FLAGYL) 250 MG tablet Take 250 mg by mouth 3 (three) times daily.   Yes [provider]  Olmesartan-amLODIPine-HCTZ (TRIBENZOR) 40-5-25 MG TABS Take 1 tablet by mouth daily.    Yes [provider]  pravastatin (PRAVACHOL) 40 MG tablet Take 40 mg by mouth at bedtime.   Yes [provider]  predniSONE (DELTASONE) 5 MG tablet Take 5 mg by mouth daily with breakfast.   Yes [provider]  pregabalin (LYRICA) 75 MG capsule Take 75 mg by mouth 3 (three) times daily.   Yes [provider]  Tafluprost (ZIOPTAN) 0.0015 % SOLN Place 1 drop into both eyes at bedtime.    Yes [provider]  temazepam (RESTORIL) 30 MG capsule Take  30 mg by mouth at bedtime.   Yes [provider]  timolol (BETIMOL) 0.5 % ophthalmic solution Place 1 drop into both eyes 2 (two) times daily.    Yes [provider]  Tofacitinib Citrate (XELJANZ) 5 MG TABS Take 1 tablet by mouth daily.   Yes [provider]  VITAMIN D, ERGOCALCIFEROL, PO Take 50,000 Units by mouth once a week. Thursdays   Yes [provider]  docusate sodium (COLACE) 100 MG capsule  Take 2 capsules (200 mg total) by mouth at bedtime. Patient taking differently: Take 200 mg by mouth as needed.  02/13/18   Rogene Houston, MD     Vital Signs: BP (!) 104/49 (BP Location: Left Leg)   Pulse 76   Temp 97.9 F (36.6 C) (Oral)   Resp 19   Ht 5' 5.5" (1.664 m)   Wt 260 lb 12.9 oz (118.3 kg)   SpO2 100%   BMI 42.74 kg/m   Physical Exam  Abdominal: Bowel sounds are decreased. There is tenderness.  Skin: Skin is warm and dry.  Sites are all clean and dry OP from anterior drains are cloudy OP from TG drain brown-- fecelent  - s/p IR perc drains x 2  - 04/16/18, culture with multiple organisms present/none predominant  3rd IR drain 11/13, culture growing FEW CANDIDA TROPICALIS  Vitals reviewed.   Imaging: Ct Abdomen Pelvis Wo Contrast  Result Date: 04/27/2018 CLINICAL DATA:  Re-evaluate diverticulitis with abscess drainage. EXAM: CT ABDOMEN AND PELVIS WITHOUT CONTRAST TECHNIQUE: Multidetector CT imaging of the abdomen and pelvis was performed following the standard protocol without IV contrast. COMPARISON:  CT scan April 23, 2018 FINDINGS: Lower chest: No acute abnormality. Hepatobiliary: No focal liver abnormality is seen. Status post cholecystectomy. No biliary dilatation. Pancreas: Unremarkable. No pancreatic ductal dilatation or surrounding inflammatory changes. Spleen: Normal in size without focal abnormality. Adrenals/Urinary Tract: There is a tiny nonobstructive stone in the lower pole the right kidney. No hydronephrosis or perinephric stranding. No ureteral stones. The bladder is normal. The adrenal glands are normal. Stomach/Bowel: The stomach is normal. There is a duodenal diverticulum off the distal third portion of the duodenum. The remainder of the small bowel is unremarkable. Colonic diverticuli are noted. A segment of sigmoid colon remains thickened. There is mild increased attenuation in the adjacent fat and some low level remaining diverticulitis is not  excluded. Remainder of the colon is normal. The appendix is best seen on coronal images 52 through 57. The appendix is located just superior to a right lower quadrant drain. There is increased attenuation in the fat of the abdomen but it does not appear to arise from the appendix. Vascular/Lymphatic: Atherosclerotic changes are seen in the abdominal aorta. No adenopathy. Reproductive: Status post hysterectomy. No adnexal masses. Other: There are 3 abscess drainage catheters. The first is seen in the pelvis on axial image 73 with no surrounding fluid. The second is seen in the upper central pelvis on axial image 59 with no surrounding drainable fluid collection. The third is seen in the right side of the pelvis on image 60. The previously identified fluid and air collection in this region has resolved. The mild fat stranding in this region is thought to be due to from the drainage catheter and recently drained abscess and not arising from the adjacent appendix. Recommend attention on follow-up. There is remaining fluid in the abdomen and pelvis. There is a small pocket of fluid measuring up to 2.8 cm on axial image  43. The distal duodenum courses through this fluid. There is another pocket of fluid in the left lower quadrant of the abdomen seen on axial image 52 measuring 4.8 x 3.7 cm. There is a small extension office fluid collection medially which abuts the drainage catheter. However, this fluid collection is clearly not being drained. Musculoskeletal: No acute or significant osseous findings. IMPRESSION: 1. There are 3 drainage catheters in the lower abdomen and pelvis. No significant fluid around any of the drainage catheters. However, there are 2 remaining fluid collections/abscesses. The first is in the region of the distal third portion of the duodenum measuring up to 2.8 cm. Duodenum appears to run through or immediately adjacent to this fluid. Another pocket of fluid is seen in the left lower quadrant of  the abdomen measuring 4.8 x 3.7 cm. 2. Nonobstructive stone in the lower pole the right kidney. 3. Thickening remains in the sigmoid colon. Low-grade remaining inflammation of the sigmoid colon from diverticulitis is not excluded. 4. There is fat stranding in the region of the appendix. However, there is a adjacent drainage catheter and I suspect the stranding is secondary to the recent abscess in this region. Recommend clinical correlation and attention on follow-up. Electronically Signed   By: Dorise Bullion III M.D   On: 04/27/2018 22:51    Labs:  CBC: Recent Labs    04/25/18 0619 04/26/18 0405 04/27/18 0321 04/28/18 0305  WBC 8.9 8.0 8.9 10.2  HGB 8.1* 8.1* 8.0* 7.7*  HCT 26.4* 25.5* 26.5* 24.9*  PLT 380 351 354 348    COAGS: Recent Labs    04/06/18 1952 04/23/18 0329  INR 1.22 1.25  APTT 33  --     BMP: Recent Labs    04/25/18 0619 04/26/18 0405 04/27/18 0321 04/28/18 0305  NA 138 136 138 137  K 3.4* 3.6 3.6 3.3*  CL 93* 91* 92* 95*  CO2 37* 38* 36* 34*  GLUCOSE 129* 124* 131* 142*  BUN 26* 29* 37* 44*  CALCIUM 7.7* 7.9* 8.3* 8.2*  CREATININE 1.18* 1.26* 1.49* 1.42*  GFRNONAA 45* 41* 34* 36*  GFRAA 52* 48* 39* 41*    LIVER FUNCTION TESTS: Recent Labs    04/10/18 0351 04/15/18 0859 04/24/18 0833 04/28/18 0305  BILITOT 0.7 1.0 0.6 0.4  AST 41 69* 55* 30  ALT 30 43 28 17  ALKPHOS 51 63 59 54  PROT 5.5* 5.6* 4.7* 4.5*  ALBUMIN 2.2* 2.1* 1.7* 1.7*    Assessment and Plan:  All 3 IR drains intact TG and anterior drain placed 11/6-- (OP fecelant TG drain and cloudy anterior drain)-- no predominant growth RLQ abs drain placed 11/13- minimal OP- cloudy-- yeast For injection of 2 anterior drain today or tomorrow-- poss pull TG- No inj or pull- for now- must remain New CT reviewed with Dr Annamaria Boots-- no new collection large enough for drain placement at this time. Consented for drain injections and possible removals.  Electronically Signed: Lavonia Drafts, PA-C 04/28/2018, 1:19 PM   I spent a total of 25 Minutes at the the patient's bedside AND on the patient's hospital floor or unit, greater than 50% of which was counseling/coordinating care for abscess drains

## 2018-04-28 NOTE — Progress Notes (Signed)
PROGRESS NOTE    [name]  OZH:[id number] DOB: 06-19-1945 DOA: 04/05/2018 PCP: [name], MD  Outpatient Specialists: None    Brief Narrative:  72 year old female with past medical history of hypertension, type 2 diabetes mellitus chronic kidney disease rheumatoid arthritis coronary artery disease S/P CABG on Plavix who was admitted with abdominal pain and found to have sigmoid diverticulitis on April 05, 2018 with perforation and abscess.  Initially was felt to be improving on IV Merrem but she continued to have abdominal pain a CT scan of the abdomen and pelvis was obtained April 15, 2018 and was significant for worsening diverticulitis with abdominal fluid collections.      Assessment & Plan:   Principal Problem:   Diverticulitis of large intestine with perforation and abscess Active Problems:   DM (diabetes mellitus) ([place])   HTN (hypertension)   Rheumatoid arthritis ([place])   Anemia   Renal insufficiency   Sepsis due to undetermined organism ([place])   CKD (chronic kidney disease) stage 3, GFR 30-59 ml/min ([facility])   Fibromyalgia   Acute renal failure superimposed on stage 3 chronic kidney disease ([place])    1.  Perforated sigmoid diverticulitis 2.  Sepsis code called patient improving on IV meropenem. 3.  Acute right upper extremity DVT extremity due to PICC line this was just discontinued April 16, 2018 still swollen but improving there is no warmth or redness.  She was initially on heparin now she is on Lovenox 4.  Acute kidney injury with chronic kidney disease stage III this has resolved she is back to baseline with creatinine of 1.2, creatinine is 1.6 today continue IV hydration patient is starting to take by mouth slowly she is also on TPN 5.  Fibromyalgia continue Lyrica 6.  Hypertension improved we will continue metoprolol her isosorbide on mevastatin and amlodipine hydrochlorothiazide combination are still on hold.  She still on Lasix 40 mg twice daily 7.   Type 2 diabetes mellitus well controlled on Lovenox and sliding scale 8.  Debility.  PT has been consulted they recommend SNF.  Patient stated to not be compliant with physical therapy 9.  Acute on chronic anemia she received 2 units of packed RBC current hemoglobin is 8.1 today it was 8.1 yesterday as well 10.  Depression patient recently started on Lexapro 11.  Paroxysmal atrial tachycardia.  She has not had any more episodes of PAT since that of April 22, 2018 echocardiogram that was done during this hospital stay showed preserved ejection fraction her beta-blocker was increased 12.  Hypokalemia resolved with repleted potassium 13.  Diarrhea due to possibly TPN.  We will also check it for C. difficile     DVT prophylaxis: (Lovenox/Heparin/SCD's/anticoagulated/None (if comfort care) Code Status: (Full/Partial - specify details) Family Communication: Husband at bedside  disposition Plan: Physical therapy recommended SNF Consultants:   General surgery   interventional radiology  Procedures: Echocardiogram Drains  percutaneous  x2 drainage of the abdominal area Right arm PICC line Left arm PICC line   Antimicrobials:  Meropenem  Flagyl  Ciprofloxacin discontinued   SUBJECTIVE: Patient seen at bedside husband in the room.  She stated she is feeling much better she was able to have some breakfast she drank half a cup of orange juice little bit of her milk and some grits today.  She is still on TPN.   April 27, 2018: Patient seen and examined at bedside with her husband in the room.  He complained that she was fed solid food  which included string beans and arise but she threw it up.  She did a little better yesterday when she had more liquid food like rates juice and milk.  She still on TPN and IV fluid.  April 28, 2018: Patient seen and examined at bedside husband in the room patient is sitting in the bed on the chair and she is complaining of being uncomfortable in  her chair and wants to go back to the bed.  Also she is been having diarrhea and a rectal tube was put in and she is complaining that it is very uncomfortable.  She also has some redness in the buttock area that is being protected from further breakdown   Objective: Vitals:   04/27/18 1318 04/27/18 2005 04/28/18 0452 04/28/18 1501  BP: 136/66 (!) 104/59 (!) 104/49 115/61  Pulse: 80 81 76 72  Resp: 16 16 19    Temp: 98.3 F (36.8 C) 98.2 F (36.8 C) 97.9 F (36.6 C) 98.7 F (37.1 C)  TempSrc: Oral Oral Oral Oral  SpO2: 96% 98% 100% 91%  Weight:      Height:        Intake/Output Summary (Last 24 hours) at 04/28/2018 2049 Last data filed at 04/28/2018 1806 Gross per 24 hour  Intake 2297.24 ml  Output 818 ml  Net 1479.24 ml   Filed Weights   04/19/18 0500 04/23/18 0500 04/24/18 0500  Weight: 123 kg 118.2 kg 118.3 kg    Examination:  General exam: Appears calm and comfortable morbidly obese she is lying in bed eyes closed responsive to being spoken to Respiratory system: Clear to auscultation. Respiratory effort normal. Cardiovascular system: S1 & S2 heard, RRR. No JVD, murmurs, rubs, gallops or clicks. No pedal edema. Gastrointestinal system: Abdomen is nondistended, soft and tender. No organomegaly or masses felt. Normal bowel sounds heard.  Multiple drains Central nervous system: Alert and oriented. No focal neurological deficits. Extremities: Symmetric 5 x 5 power. Skin: Intertrigo in the breasts and abdominal folds, lesions or ulcers Psychiatry: Judgement and insight appear normal. Mood & affect appropriate are  poor.     Data Reviewed: I have personally reviewed following labs and imaging studies  CBC: Recent Labs  Lab 04/24/18 0833 04/25/18 0619 04/26/18 0405 04/27/18 0321 04/28/18 0305  WBC 8.8 8.9 8.0 8.9 10.2  NEUTROABS 6.4  --   --   --  7.7  HGB 8.5* 8.1* 8.1* 8.0* 7.7*  HCT 26.4* 26.4* 25.5* 26.5* 24.9*  MCV 94.0 95.0 94.4 95.0 93.6  PLT 352 380 351  354 761   Basic Metabolic Panel: Recent Labs  Lab 04/22/18 0359  04/24/18 0833 04/25/18 0619 04/26/18 0405 04/27/18 0321 04/28/18 0305  NA 139   < > 139 138 136 138 137  K 3.3*   < > 3.4* 3.4* 3.6 3.6 3.3*  CL 97*   < > 92* 93* 91* 92* 95*  CO2 34*   < > 37* 37* 38* 36* 34*  GLUCOSE 109*   < > 142* 129* 124* 131* 142*  BUN 17   < > 20 26* 29* 37* 44*  CREATININE 1.02*   < > 1.07* 1.18* 1.26* 1.49* 1.42*  CALCIUM 7.7*   < > 7.6* 7.7* 7.9* 8.3* 8.2*  MG  --   --  0.9* 1.5*  --  1.6* 1.6*  PHOS 3.4  --  3.7  --   --  3.5 3.9   < > = values in this interval not displayed.   GFR:  Estimated Creatinine Clearance: 45.8 mL/min (A) (by C-G formula based on SCr of 1.42 mg/dL (H)). Liver Function Tests: Recent Labs  Lab 04/24/18 0833 04/28/18 0305  AST 55* 30  ALT 28 17  ALKPHOS 59 54  BILITOT 0.6 0.4  PROT 4.7* 4.5*  ALBUMIN 1.7* 1.7*   No results for input(s): LIPASE, AMYLASE in the last 168 hours. No results for input(s): AMMONIA in the last 168 hours. Coagulation Profile: Recent Labs  Lab 04/23/18 0329  INR 1.25   Cardiac Enzymes: No results for input(s): CKTOTAL, CKMB, CKMBINDEX, TROPONINI in the last 168 hours. BNP (last 3 results) No results for input(s): PROBNP in the last 8760 hours. HbA1C: No results for input(s): HGBA1C in the last 72 hours. CBG: Recent Labs  Lab 04/27/18 1959 04/27/18 2311 04/28/18 0538 04/28/18 1151 04/28/18 1651  GLUCAP 150* 127* 143* 181* 150*   Lipid Profile: Recent Labs    04/28/18 0305  TRIG 113   Thyroid Function Tests: No results for input(s): TSH, T4TOTAL, FREET4, T3FREE, THYROIDAB in the last 72 hours. Anemia Panel: No results for input(s): VITAMINB12, FOLATE, FERRITIN, TIBC, IRON, RETICCTPCT in the last 72 hours. Urine analysis:    Component Value Date/Time   COLORURINE YELLOW 04/05/2018 1924   APPEARANCEUR CLEAR 04/05/2018 1924   LABSPEC 1.021 04/05/2018 1924   PHURINE 5.0 04/05/2018 1924   GLUCOSEU NEGATIVE  04/05/2018 1924   HGBUR NEGATIVE 04/05/2018 1924   BILIRUBINUR NEGATIVE 04/05/2018 [place] NEGATIVE 04/05/2018 1924   PROTEINUR NEGATIVE 04/05/2018 1924   UROBILINOGEN 0.2 06/20/2013 1832   NITRITE NEGATIVE 04/05/2018 1924   LEUKOCYTESUR NEGATIVE 04/05/2018 1924   Sepsis Labs: @LABRCNTIP (procalcitonin:4,lacticidven:4)  ) Recent Results (from the past 240 hour(s))  Aerobic/Anaerobic Culture (surgical/deep wound)     Status: None   Collection Time: 04/23/18  3:20 PM  Result Value Ref Range Status   Specimen Description ABSCESS RIGHT LOWER QUADRANT  Final   Special Requests Normal  Final   Gram Stain NO WBC SEEN NO ORGANISMS SEEN   Final   Culture   Final    FEW CANDIDA TROPICALIS NO ANAEROBES ISOLATED Performed at [facility], [place] [address], [place], [place] [address]    Report Status 04/28/2018 FINAL  Final         Radiology Studies: Ct Abdomen Pelvis Wo Contrast  Result Date: 04/27/2018 CLINICAL DATA:  Re-evaluate diverticulitis with abscess drainage. EXAM: CT ABDOMEN AND PELVIS WITHOUT CONTRAST TECHNIQUE: Multidetector CT imaging of the abdomen and pelvis was performed following the standard protocol without IV contrast. COMPARISON:  CT scan April 23, 2018 FINDINGS: Lower chest: No acute abnormality. Hepatobiliary: No focal liver abnormality is seen. Status post cholecystectomy. No biliary dilatation. Pancreas: Unremarkable. No pancreatic ductal dilatation or surrounding inflammatory changes. Spleen: Normal in size without focal abnormality. Adrenals/Urinary Tract: There is a tiny nonobstructive stone in the lower pole the right kidney. No hydronephrosis or perinephric stranding. No ureteral stones. The bladder is normal. The adrenal glands are normal. Stomach/Bowel: The stomach is normal. There is a duodenal diverticulum off the distal third portion of the duodenum. The remainder of the small bowel is unremarkable. Colonic diverticuli are noted. A segment  of sigmoid colon remains thickened. There is mild increased attenuation in the adjacent fat and some low level remaining diverticulitis is not excluded. Remainder of the colon is normal. The appendix is best seen on coronal images 52 through 57. The appendix is located just superior to a right lower quadrant drain. There is  increased attenuation in the fat of the abdomen but it does not appear to arise from the appendix. Vascular/Lymphatic: Atherosclerotic changes are seen in the abdominal aorta. No adenopathy. Reproductive: Status post hysterectomy. No adnexal masses. Other: There are 3 abscess drainage catheters. The first is seen in the pelvis on axial image 73 with no surrounding fluid. The second is seen in the upper central pelvis on axial image 59 with no surrounding drainable fluid collection. The third is seen in the right side of the pelvis on image 60. The previously identified fluid and air collection in this region has resolved. The mild fat stranding in this region is thought to be due to from the drainage catheter and recently drained abscess and not arising from the adjacent appendix. Recommend attention on follow-up. There is remaining fluid in the abdomen and pelvis. There is a small pocket of fluid measuring up to 2.8 cm on axial image 43. The distal duodenum courses through this fluid. There is another pocket of fluid in the left lower quadrant of the abdomen seen on axial image 52 measuring 4.8 x 3.7 cm. There is a small extension office fluid collection medially which abuts the drainage catheter. However, this fluid collection is clearly not being drained. Musculoskeletal: No acute or significant osseous findings. IMPRESSION: 1. There are 3 drainage catheters in the lower abdomen and pelvis. No significant fluid around any of the drainage catheters. However, there are 2 remaining fluid collections/abscesses. The first is in the region of the distal third portion of the duodenum measuring up to  2.8 cm. Duodenum appears to run through or immediately adjacent to this fluid. Another pocket of fluid is seen in the left lower quadrant of the abdomen measuring 4.8 x 3.7 cm. 2. Nonobstructive stone in the lower pole the right kidney. 3. Thickening remains in the sigmoid colon. Low-grade remaining inflammation of the sigmoid colon from diverticulitis is not excluded. 4. There is fat stranding in the region of the appendix. However, there is a adjacent drainage catheter and I suspect the stranding is secondary to the recent abscess in this region. Recommend clinical correlation and attention on follow-up. Electronically Signed   By: Dorise Bullion III M.D   On: 04/27/2018 22:51        Scheduled Meds: . acetaminophen  1,000 mg Oral TID  . Chlorhexidine Gluconate Cloth  6 each Topical Daily  . enoxaparin (LOVENOX) injection  1 mg/kg Subcutaneous Q12H  . escitalopram  20 mg Oral QHS  . feeding supplement  237 mL Oral BID BM  . furosemide  40 mg Intravenous BID  . insulin aspart  0-15 Units Subcutaneous Q6H  . latanoprost  1 drop Both Eyes QHS  . metoprolol tartrate  37.5 mg Oral BID  . nystatin   Topical TID  . pantoprazole  40 mg Oral Daily  . pravastatin  40 mg Oral QHS  . pregabalin  75 mg Oral QHS  . sodium chloride flush  5 mL Intracatheter Q8H  . timolol  1 drop Both Eyes BID   Continuous Infusions: . meropenem (MERREM) IV 1 g (04/28/18 1425)  . TPN ADULT (ION) 80 mL/hr at 04/28/18 1712     LOS: 21 days    Time spent: 25 minutes    Cristal Deer, MD [facility] Pager [phone]  If 7PM-7AM, please contact night-coverage [email or web address] Password [facility] 04/28/2018, 8:49 PM

## 2018-04-28 NOTE — Progress Notes (Signed)
Physical Therapy Treatment Patient Details Name: [name] MRN: [id number] DOB: 28-Aug-1945 Today's Date: 04/28/2018    History of Present Illness [name]  is a 72 y.o. female, w hypertension, hyperlipidemia, dm2, pvd, CVA, OSA, Rheumatoid arthritis apparently c/o LLQ pain over the past several weeks, just started on abx yesterday, pt notes subjective fever as well as nausea.  Pt denies emesis, constipation, diarrhea, brbpr, black stool.  Pt presented due to worsening abdominal pain today. Found to have acute perforated sigmoid diverticulitis with multiple abscesses. Pt is s/p abdominal abscess drains X2.     PT Comments    Patient's tolerance to treatment today was fair.  Patient was in recliner chair with husband and nursing present moaning with complaints of pain in her buttocks.  Treatment initiated with patient performing sit to stand from chair to stedy requiring max assistance +2 to boost into standing and verbal cueing for hand placement.  Patient had incontinence episode prior to transfer to bed.  Patient was able stand approximately 10 seconds before returning to bed.  Therapist and nursing provided pericare with patient in supine.  Patient was able to complete LE strengthening exercises once in bed.  Patient would benefit from acute care PT in order to address strength and mobility deficits.  Patient continues to be a good candidate for SNF destination to improve functional outcomes based on current functional status.   Follow Up Recommendations  SNF     Equipment Recommendations  None recommended by PT    Recommendations for Other Services       Precautions / Restrictions Precautions Precautions: Fall;Other (comment) Precaution Comments: 2 abscess drains Restrictions Weight Bearing Restrictions: No    Mobility  Bed Mobility Overal bed mobility: Needs Assistance Bed Mobility: Rolling;Sit to Supine Rolling: Mod assist;+2 for physical assistance     Sit to  supine: Mod assist;+2 for physical assistance   General bed mobility comments: Patient required VC to lean to left and lower trunk to bed on left elbow.  Patient required mod A to elevate B LE to bed.  Patient was able to roll several times left and right requiring mod A+2 from therapist and nursing.  Rolling was performed for skin inspection of pt's buttocks and pericare.  Transfers Overall transfer level: Needs assistance   Transfers: Sit to/from Stand Sit to Stand: Max assist;+2 physical assistance(mod A+2 from elevated stedy plates)         General transfer comment: Patient required max A +2 to perform sit to stand from low chair surface into stedy.  Therapist utilized bed pad and facilitated lift with assistance from pelvis.  Patient required use of B UE to pull into standing with front stedy bar.  Patient performed sit to stand from elevated stedy plates demonstrating increased forward trunk flexion and B knee flexion.  Patient required VC to stand upright.  Ambulation/Gait                 Stairs             Wheelchair Mobility    Modified Rankin (Stroke Patients Only)       Balance Overall balance assessment: Needs assistance Sitting-balance support: Feet supported;Bilateral upper extremity supported Sitting balance-Leahy Scale: Fair     Standing balance support: Bilateral upper extremity supported Standing balance-Leahy Scale: Poor Standing balance comment: pt stood in stedy approximately 10 seconds.  Cognition Arousal/Alertness: Awake/alert Behavior During Therapy: WFL for tasks assessed/performed Overall Cognitive Status: Within Functional Limits for tasks assessed                                        Exercises General Exercises - Lower Extremity Ankle Circles/Pumps: AROM;Both;20 reps;Supine Quad Sets: AROM;Both;10 reps;Supine Heel Slides: AROM;Both;10 reps;Supine Hip ABduction/ADduction:  AROM;Both;10 reps;Supine    General Comments        Pertinent Vitals/Pain Pain Assessment: 0-10 Pain Score: 10-Worst pain ever Pain Location: buttocks Pain Descriptors / Indicators: Grimacing;Constant;Discomfort Pain Intervention(s): Limited activity within patient's tolerance;Monitored during session;Repositioned    Home Living                      Prior Function            PT Goals (current goals can now be found in the care plan section) Acute Rehab PT Goals Patient Stated Goal: none stated PT Goal Formulation: With patient/family Time For Goal Achievement: 04/28/18 Potential to Achieve Goals: Fair Progress towards PT goals: Progressing toward goals    Frequency    Min 3X/week      PT Plan Current plan remains appropriate    Co-evaluation              AM-PAC PT "6 Clicks" Daily Activity  Outcome Measure  Difficulty turning over in bed (including adjusting bedclothes, sheets and blankets)?: Unable Difficulty moving from lying on back to sitting on the side of the bed? : Unable Difficulty sitting down on and standing up from a chair with arms (e.g., wheelchair, bedside commode, etc,.)?: Unable Help needed moving to and from a bed to chair (including a wheelchair)?: A Lot Help needed walking in hospital room?: Total Help needed climbing 3-5 steps with a railing? : Total 6 Click Score: 7    End of Session   Activity Tolerance: Patient limited by pain;Patient limited by fatigue Patient left: in bed;with call bell/phone within reach;with bed alarm set;with family/visitor present Nurse Communication: Mobility status PT Visit Diagnosis: Unsteadiness on feet (R26.81);Muscle weakness (generalized) (M62.81)     Time: 7471-5953 PT Time Calculation (min) (ACUTE ONLY): 21 min  Charges:  $Therapeutic Activity: 8-22 mins                     [address], SPTA    Verbie Babic 04/28/2018, 3:22 PM

## 2018-04-28 NOTE — Progress Notes (Signed)
CSW continuing to follow for discharge needs.  Michalina Calbert LCSW [phone]  

## 2018-04-28 NOTE — Progress Notes (Signed)
[facility] Progress Note     Subjective: CC-  In bed. States that she is tired and feels about the same. Continues to complain of lower abdominal pain and suppressed appetite. Some nausea, no emesis.  WBC 10.2, afebrile.  Objective: Vital signs in last 24 hours: Temp:  [97.9 F (36.6 C)-98.3 F (36.8 C)] 97.9 F (36.6 C) (11/18 0452) Pulse Rate:  [60-81] 76 (11/18 0452) Resp:  [16-19] 19 (11/18 0452) BP: (104-136)/(49-78) 104/49 (11/18 0452) SpO2:  [96 %-100 %] 100 % (11/18 0452) Last BM Date: 04/27/18  Intake/Output from previous day: 11/17 0701 - 11/18 0700 In: 1109.1 [I.V.:814.1; IV Piggyback:250] Out: 1700 [Urine:1650; Drains:50] Intake/Output this shift: No intake/output data recorded.  PE: Gen: Alert, NAD HEENT: EOM's intact, pupils equal and round Pulm: effort normal MEQ:ASTMH, soft, +BS, no HSM, TTP LLQ/ no peritonitis,drain x3withtracepurulent drainagein bags  - #1 25cc, #210cc, #315cc Psych: A&Ox3 Skin: no rashes noted, warm and dry  Lab Results:  Recent Labs    04/27/18 0321 04/28/18 0305  WBC 8.9 10.2  HGB 8.0* 7.7*  HCT 26.5* 24.9*  PLT 354 348   BMET Recent Labs    04/27/18 0321 04/28/18 0305  NA 138 137  K 3.6 3.3*  CL 92* 95*  CO2 36* 34*  GLUCOSE 131* 142*  BUN 37* 44*  CREATININE 1.49* 1.42*  CALCIUM 8.3* 8.2*   PT/INR No results for input(s): LABPROT, INR in the last 72 hours. CMP     Component Value Date/Time   NA 137 04/28/2018 0305   K 3.3 (L) 04/28/2018 0305   CL 95 (L) 04/28/2018 0305   CO2 34 (H) 04/28/2018 0305   GLUCOSE 142 (H) 04/28/2018 0305   BUN 44 (H) 04/28/2018 0305   CREATININE 1.42 (H) 04/28/2018 0305   CALCIUM 8.2 (L) 04/28/2018 0305   PROT 4.5 (L) 04/28/2018 0305   ALBUMIN 1.7 (L) 04/28/2018 0305   AST 30 04/28/2018 0305   ALT 17 04/28/2018 0305   ALKPHOS 54 04/28/2018 0305   BILITOT 0.4 04/28/2018 0305   GFRNONAA 36 (L) 04/28/2018 0305   GFRAA 41 (L) 04/28/2018 0305    Lipase  No results found for: LIPASE     Studies/Results: Ct Abdomen Pelvis Wo Contrast  Result Date: 04/27/2018 CLINICAL DATA:  Re-evaluate diverticulitis with abscess drainage. EXAM: CT ABDOMEN AND PELVIS WITHOUT CONTRAST TECHNIQUE: Multidetector CT imaging of the abdomen and pelvis was performed following the standard protocol without IV contrast. COMPARISON:  CT scan April 23, 2018 FINDINGS: Lower chest: No acute abnormality. Hepatobiliary: No focal liver abnormality is seen. Status post cholecystectomy. No biliary dilatation. Pancreas: Unremarkable. No pancreatic ductal dilatation or surrounding inflammatory changes. Spleen: Normal in size without focal abnormality. Adrenals/Urinary Tract: There is a tiny nonobstructive stone in the lower pole the right kidney. No hydronephrosis or perinephric stranding. No ureteral stones. The bladder is normal. The adrenal glands are normal. Stomach/Bowel: The stomach is normal. There is a duodenal diverticulum off the distal third portion of the duodenum. The remainder of the small bowel is unremarkable. Colonic diverticuli are noted. A segment of sigmoid colon remains thickened. There is mild increased attenuation in the adjacent fat and some low level remaining diverticulitis is not excluded. Remainder of the colon is normal. The appendix is best seen on coronal images 52 through 57. The appendix is located just superior to a right lower quadrant drain. There is increased attenuation in the fat of the abdomen but it does not appear to arise from  the appendix. Vascular/Lymphatic: Atherosclerotic changes are seen in the abdominal aorta. No adenopathy. Reproductive: Status post hysterectomy. No adnexal masses. Other: There are 3 abscess drainage catheters. The first is seen in the pelvis on axial image 73 with no surrounding fluid. The second is seen in the upper central pelvis on axial image 59 with no surrounding drainable fluid collection. The third is  seen in the right side of the pelvis on image 60. The previously identified fluid and air collection in this region has resolved. The mild fat stranding in this region is thought to be due to from the drainage catheter and recently drained abscess and not arising from the adjacent appendix. Recommend attention on follow-up. There is remaining fluid in the abdomen and pelvis. There is a small pocket of fluid measuring up to 2.8 cm on axial image 43. The distal duodenum courses through this fluid. There is another pocket of fluid in the left lower quadrant of the abdomen seen on axial image 52 measuring 4.8 x 3.7 cm. There is a small extension office fluid collection medially which abuts the drainage catheter. However, this fluid collection is clearly not being drained. Musculoskeletal: No acute or significant osseous findings. IMPRESSION: 1. There are 3 drainage catheters in the lower abdomen and pelvis. No significant fluid around any of the drainage catheters. However, there are 2 remaining fluid collections/abscesses. The first is in the region of the distal third portion of the duodenum measuring up to 2.8 cm. Duodenum appears to run through or immediately adjacent to this fluid. Another pocket of fluid is seen in the left lower quadrant of the abdomen measuring 4.8 x 3.7 cm. 2. Nonobstructive stone in the lower pole the right kidney. 3. Thickening remains in the sigmoid colon. Low-grade remaining inflammation of the sigmoid colon from diverticulitis is not excluded. 4. There is fat stranding in the region of the appendix. However, there is a adjacent drainage catheter and I suspect the stranding is secondary to the recent abscess in this region. Recommend clinical correlation and attention on follow-up. Electronically Signed   By: Dorise Bullion III M.D   On: 04/27/2018 22:51    Anti-infectives: Anti-infectives (From admission, onward)   Start     Dose/Rate Route Frequency Ordered Stop   04/27/18 1500   meropenem (MERREM) 1 g in sodium chloride 0.9 % 100 mL IVPB     1 g 200 mL/hr over 30 Minutes Intravenous Every 12 hours 04/27/18 1008     04/26/18 1300  metroNIDAZOLE (FLAGYL) IVPB 500 mg  Status:  Discontinued     500 mg 100 mL/hr over 60 Minutes Intravenous Every 6 hours 04/26/18 1257 04/27/18 1041   04/15/18 1700  meropenem (MERREM) 1 g in sodium chloride 0.9 % 100 mL IVPB  Status:  Discontinued     1 g 200 mL/hr over 30 Minutes Intravenous Every 8 hours 04/15/18 1534 04/27/18 1008   04/09/18 2200  meropenem (MERREM) 1 g in sodium chloride 0.9 % 100 mL IVPB  Status:  Discontinued     1 g 200 mL/hr over 30 Minutes Intravenous Every 8 hours 04/09/18 1244 04/14/18 0944   04/06/18 1300  meropenem (MERREM) 1 g in sodium chloride 0.9 % 100 mL IVPB  Status:  Discontinued     1 g 200 mL/hr over 30 Minutes Intravenous Every 12 hours 04/06/18 1145 04/09/18 1244   04/06/18 1145  meropenem (MERREM) 1 g in sodium chloride 0.9 % 100 mL IVPB  Status:  Discontinued  1 g 200 mL/hr over 30 Minutes Intravenous Every 8 hours 04/06/18 1139 04/06/18 1145   04/06/18 0900  ciprofloxacin (CIPRO) IVPB 400 mg  Status:  Discontinued     400 mg 200 mL/hr over 60 Minutes Intravenous Every 12 hours 04/06/18 0732 04/06/18 1139   04/06/18 0300  metroNIDAZOLE (FLAGYL) IVPB 500 mg  Status:  Discontinued     500 mg 100 mL/hr over 60 Minutes Intravenous Every 8 hours 04/05/18 2335 04/06/18 1139   04/05/18 1930  ciprofloxacin (CIPRO) IVPB 400 mg     400 mg 200 mL/hr over 60 Minutes Intravenous  Once 04/05/18 1925 04/05/18 2217   04/05/18 1930  metroNIDAZOLE (FLAGYL) IVPB 500 mg     500 mg 100 mL/hr over 60 Minutes Intravenous  Once 04/05/18 1925 04/05/18 2102       Assessment/Plan HTN HLD 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 - on plavix (last dose 10/27) H/o CVA  Morbidly obese Chronic pain RUE DVT - was on IV heparin, now treatment dose lovenox  Acute perforated sigmoid diverticulitis with multiple  abscesses - CT scan 11/5 revealsat least 3 fluid collectionsin the lower abdomen/pelvis with previous perforated diverticulitis; fluid collections are7.3 x 5.8 cm in left lower quadrant, air-fluid collection measuring 8.2 x 5.9 cm in the right lower quadrant, and8.9 x 5.3 cm fluid collection in the pelvis in the pre rectal space which appears to contain stool and fluid -s/pIR perc drains x 2 - 04/16/18, culture with multiple organisms present/none predominant - CT scan 11/12 showed resolution of large pelvic abscess, minimal residual mesenteric abscess although this fluid appears to communicate with a second collection in the mesentery in the LEFT mid abdomen adjacent to a small bowel loop measuring 4.4 x 3.5 cm, and persistent large extraluminal gas collection in the RIGHT pelvis 8.2 x 5.0 x 6.8 cm. -S/p3rdIR drain 11/13, culture growing FEW CANDIDA TROPICALIS, report pending - CT scan 11/17 showed 3 drainage catheters with no significant surrounding fluid, but there are 2 remaining fluid collections/abscesses in the pelvis with the largest 4.8 x 3.7 cm  ID -currently merrem 10/27>>, flagyl 11/16>> VTE -SCDs, lovenox FEN -IVF,soft diet, TPN. Prealbumin 7.3 (11/18)  Plan:Will review CT scan with MD, it shows that the previous abscesses appear to be resolving but there are still 2 fluid collections/abscesses present. Surgery vs another perc drain. Patient is currently stable. Continue soft diet and TPN. Mobilize. Continue drains and abx.   LOS: 21 days    Wellington Hampshire , [facility] 04/28/2018, 8:41 AM Pager: [phone] Mon 7:00 am -11:30 AM Tues-Fri 7:00 am-4:30 pm Sat-Sun 7:00 am-11:30 am

## 2018-04-28 NOTE — Progress Notes (Signed)
PHARMACY - ADULT TOTAL PARENTERAL NUTRITION CONSULT NOTE   Pharmacy Consult for TPN Indication: perforated diverticulitis  Patient Measurements: Height: 5' 5.5" (166.4 cm) Weight: 260 lb 12.9 oz (118.3 kg) IBW/kg (Calculated) : 58.15 TPN AdjBW (KG): 75.1 Body mass index is 42.74 kg/m.  Assessment: Admitted 10/26 with acute sigmoid diverticulitis without perf or abscess.   PMH: HTN, HLD, CKD3, RA, DM, CAD, CVA, CRI, fibromyalgia,morbid obesity, chronic pain, RUE DVT  GI: Acute perforated sigmoid diverticulitis with multiple abscesses. Patient has continued to have inadequate oral intake since admission. Soft diet with poor intake. Complains of abdominal pan and suppressed appetite w/ some nausea. Pre-albumin 6.6>7.3. LBM 11/16. Drain output 50. Per RD, pt is high risk for refeeding. Ensure Surgery charted x 1 on 11/17. - po PPI  11/5: at least 3 fluid collectionsin the lower abdomen/pelvis with previous perforated diverticulitis; 11/12 CT: resolution of large pelvic abscess, minimal residual mesenteric abscess although this fluid appears to communicate with a second collection in the mesentery in the LEFT mid abdomen adjacent to a small bowel loop, and persistent large extraluminal gas collection in the RIGHT pelvis-place additional 3rd drain.  Endo: A1C 5.9. (glipizide PTA)  DM, CBGs <143-151 Insulin requirements in the past 24 hours: 11 units  Lytes: K down to 3.3. Cl low, bicarb high, Mg 1.6 still low. Ca adjusts to 9.6. Remains at risk for refeeding   Renal:CKD3. Scr 1.42 (BL ~ 1.07). BUN 44 up. (UOP 0.97ml/kg/hr) Pulm: [place] Cards: VSS on IV Lasix, metoprolol, Pravastatin Hepatobil: LFTs normalized  Neuro: RA, fibromyalgia, h/o CVA. Scheduled tylenol, Lexapro, Lyrica NG:EXBMWU from perforated sigmoid diverticulitis on Merrem, WBC 10.2, afebrile  Abscess from 11/13 growing few candida tropicalis  Anticoag: RUE PICC DVT on LMWH  TPN Access: removed 11/6 for DVT,  new 04/23/18 TPN  start date:04/23/18 Nutritional Goals (per RD recommendation on 04/25/18): XLKG:[id number] Protein:  85-100 Fluid: 1.6-1.8L  Goal TPN rate is 80 ml/hr   Current Nutrition: Clear liquids, new TPN 11/13.   Plan:  Continue TPN at 82mL/hr. - TPN (at goal 80 ml/hr) provides 96 g of protein, 326 g of dextrose, and 38.4 g of lipids which provides 1877 kCals per day, meeting 100% of patient needs  Electrolytes in TPN: adjust for refeeding. Max Cl, Increase K & Mg  Add MVI, trace elements  SSI and adjust as needed.  Monitor TPN labs Mon/Thurs and prn Magnesium 2g IV x 1 today KCl 10 meQ x 4 runs today  Monitor oral intake   Albertina Parr, PharmD., BCPS Clinical Pharmacist Clinical phone for 04/28/18 until 3:30pm: [phone] If after 3:30pm, please refer to [facility] for unit-specific pharmacist

## 2018-04-29 LAB — CBC
HEMATOCRIT: 24.4 % — AB (ref 36.0–46.0)
Hemoglobin: 7.7 g/dL — ABNORMAL LOW (ref 12.0–15.0)
MCH: 29.4 pg (ref 26.0–34.0)
MCHC: 31.6 g/dL (ref 30.0–36.0)
MCV: 93.1 fL (ref 80.0–100.0)
Platelets: 339 10*3/uL (ref 150–400)
RBC: 2.62 MIL/uL — ABNORMAL LOW (ref 3.87–5.11)
RDW: 16.8 % — AB (ref 11.5–15.5)
WBC: 9.8 10*3/uL (ref 4.0–10.5)
nRBC: 0.5 % — ABNORMAL HIGH (ref 0.0–0.2)

## 2018-04-29 LAB — GLUCOSE, CAPILLARY
GLUCOSE-CAPILLARY: 131 mg/dL — AB (ref 70–99)
GLUCOSE-CAPILLARY: 155 mg/dL — AB (ref 70–99)
Glucose-Capillary: 135 mg/dL — ABNORMAL HIGH (ref 70–99)
Glucose-Capillary: 141 mg/dL — ABNORMAL HIGH (ref 70–99)
Glucose-Capillary: 145 mg/dL — ABNORMAL HIGH (ref 70–99)

## 2018-04-29 LAB — SURGICAL PCR SCREEN
MRSA, PCR: NEGATIVE
STAPHYLOCOCCUS AUREUS: NEGATIVE

## 2018-04-29 LAB — BASIC METABOLIC PANEL
Anion gap: 9 (ref 5–15)
BUN: 49 mg/dL — ABNORMAL HIGH (ref 8–23)
CALCIUM: 8.4 mg/dL — AB (ref 8.9–10.3)
CO2: 33 mmol/L — AB (ref 22–32)
CREATININE: 1.41 mg/dL — AB (ref 0.44–1.00)
Chloride: 94 mmol/L — ABNORMAL LOW (ref 98–111)
GFR calc Af Amer: 42 mL/min — ABNORMAL LOW (ref >60)
GFR calc non Af Amer: 36 mL/min — ABNORMAL LOW (ref >60)
GLUCOSE: 130 mg/dL — AB (ref 70–99)
Potassium: 3.6 mmol/L (ref 3.5–5.1)
Sodium: 136 mmol/L (ref 135–145)

## 2018-04-29 LAB — C DIFFICILE QUICK SCREEN W PCR REFLEX
C Diff antigen: POSITIVE — AB
C Diff toxin: NEGATIVE

## 2018-04-29 LAB — CLOSTRIDIUM DIFFICILE BY PCR, REFLEXED: Toxigenic C. Difficile by PCR: NEGATIVE

## 2018-04-29 LAB — MAGNESIUM: Magnesium: 1.9 mg/dL (ref 1.7–2.4)

## 2018-04-29 MED ORDER — BOOST / RESOURCE BREEZE PO LIQD CUSTOM
1.0000 | Freq: Three times a day (TID) | ORAL | Status: DC
  Administered 2018-04-29: 1 via ORAL

## 2018-04-29 MED ORDER — FLUCONAZOLE IN SODIUM CHLORIDE 400-0.9 MG/200ML-% IV SOLN
400.0000 mg | INTRAVENOUS | Status: DC
  Administered 2018-04-29 – 2018-05-11 (×13): 400 mg via INTRAVENOUS
  Filled 2018-04-29 (×13): qty 200

## 2018-04-29 MED ORDER — ENOXAPARIN SODIUM 120 MG/0.8ML ~~LOC~~ SOLN
1.0000 mg/kg | Freq: Once | SUBCUTANEOUS | Status: DC
  Filled 2018-04-29: qty 0.79

## 2018-04-29 MED ORDER — GUAIFENESIN ER 600 MG PO TB12
600.0000 mg | ORAL_TABLET | Freq: Two times a day (BID) | ORAL | Status: DC | PRN
  Administered 2018-04-29: 600 mg via ORAL
  Filled 2018-04-29: qty 1

## 2018-04-29 MED ORDER — ENOXAPARIN SODIUM 120 MG/0.8ML ~~LOC~~ SOLN
120.0000 mg | Freq: Once | SUBCUTANEOUS | Status: AC
  Administered 2018-04-29: 120 mg via SUBCUTANEOUS

## 2018-04-29 MED ORDER — FUROSEMIDE 10 MG/ML IJ SOLN
40.0000 mg | Freq: Every day | INTRAMUSCULAR | Status: DC
  Administered 2018-05-01 – 2018-05-06 (×6): 40 mg via INTRAVENOUS
  Filled 2018-04-29 (×7): qty 4

## 2018-04-29 MED ORDER — TRAVASOL 10 % IV SOLN
INTRAVENOUS | Status: AC
  Administered 2018-04-29: 18:00:00 via INTRAVENOUS
  Filled 2018-04-29: qty 960

## 2018-04-29 NOTE — Progress Notes (Addendum)
Nutrition Follow-up  DOCUMENTATION CODES:   Morbid obesity  INTERVENTION:   - TPN management per pharmacy  Re-estimated needs: Kcal:  1800-2000 Protein:  90-1050 grams Fluid:  1.8-2.0 L  - Diet advancement per surgery  - Addendum: d/c Ensure Surgery BID, order Boost Breeze po TID, each supplement provides 250 kcal and 9 grams of protein  - Recommend obtaining new weight (last recorded weight was on 04/24/18), noted order placed  NUTRITION DIAGNOSIS:   Inadequate oral intake related to inability to eat as evidenced by energy intake < or equal to 50% for > or equal to 5 days (NPO/clear liquid status >5 days).  Ongoing  GOAL:   Patient will meet greater than or equal to 90% of their needs  Met via TPN  MONITOR:   PO intake, Diet advancement, Labs  REASON FOR ASSESSMENT:   Consult New TPN/TNA  ASSESSMENT:   72 year old female with PMH significant for CAD, HLD/HTN, CVA, T2DM, RA, and fibromyalgia/chronic pain. Pt presented w/ 2 weeks of lower abdominal pain. Had recently been dx w/ UTI and palced on abx, but pain continued. Initial CT scan showed diverticulitis w/o perf, but pt developed hypotension and repeat CT did show perforation. Surgery consulted.  10/26-10/31 - NPO 10/31 - advanced to clear liquid diet 11/1 - advanced to full liquid diet 11/5 - advanced to soft diet (had 2 meals) 11/6 - NPO per surgery for bowel rest, s/p IR placement of 2 abdominal abscess drains 11/7-11/11 - clear liquid diet 11/12 - CT scan showing resolution of large pelvic abscess, minimal residual mesenteric abscess, s/p additional drain placement 11/13 - s/p PICC placement, TPN initiated at 40 ml/hr, , CT showing right lower quadrant abdomen air/fluid collection, s/p IR placement of third abscess drain 11/14 - advanced to clear liquid diet 11/15 - advanced to full liquid diet 11/16 - advanced to soft diet, TPN increased to 60 ml/hr 11/17 - TPN increased to goal rate of 80 ml/hr, CT  showing resolving previous abscesses but 2 fluid collections/abscesses present 11/19 - back to clear liquid diet  Per surgery note this AM, pt not improving despite aggressive non-operative treatment will multiple drains. Plan is for ex-lap, colon resection, possible bowel resection, and possible ostomy tomorrow, 11/12. Pt back to clear liquids and will be NPO after midnight.  Also noted plan for IR to remove 2 of the 3 drains today.  Pt continues to have inadequate PO intake and remains on TPN. PO 0-100% x last 8 meals, extremely varied.  Spoke with pt at bedside who was dry heaving and requesting emesis bag. Alerted RN and provided pt with emesis bag. Pt states that she is not doing well this morning. Pt states that she has only had water to drink.  Noted weight has fluctuated significant during admission 255-299 lbs). Last recorded weight on 04/24/18 was 260.8 lbs. Will order new weight to better evaluate weight changes. Pt is at risk for acute protein-calorie malnutrition. Will follow weight trends and complete repeat NFPE at follow-up. Planned to complete repeat NFPE today but deferred as pt was dry heaving during visit and reporting pain.  Current TPN at goal rate of 80 ml/hr to provide 1877 kcal, 96 grams of protein, 326 grams of dextrose, and 38.4 grams of lipids. Discussed re-estimated needs with TPN Pharmacist.  Medications reviewed and include: Ensure Surgery BID, Lasix 40 mg BID, Protonix, TPN, IV antibiotics  Labs reviewed: BUN 49 (H), creatinine 1.41 (H), hemoglobin 7.7 (L) CBG's: 145, 135, 150, 181 x  24 hours  UOP: 1600 ml x 24 hours Right back drain: 18 ml x 24 hours Right abdominal drain: 9 ml x 24 hours Left abdominal drain: 9 ml x 24 hours I/O's: +18.4 L since admission  NUTRITION - FOCUSED PHYSICAL EXAM:  Pt needs repeat NFPE. Deferred at this date due to nausea, dry heaving, and pain.  Diet Order:   Diet Order            Diet NPO time specified  Diet effective  midnight        Diet clear liquid Room service appropriate? Yes; Fluid consistency: Thin  Diet effective now              EDUCATION NEEDS:   Education needs have been addressed  Skin:  Skin Assessment: Reviewed RN Assessment (MASD to breast, abdomen, groin)  Last BM:  11/18 (medium type 7)  Height:   Ht Readings from Last 1 Encounters:  04/16/18 5' 5.5" (1.664 m)    Weight:   Wt Readings from Last 1 Encounters:  04/24/18 118.3 kg    Ideal Body Weight:  56.82 kg  BMI:  Body mass index is 42.74 kg/m.  Estimated Nutritional Needs:   Kcal:  1800-2000  Protein:  90-105 grams  Fluid:  1.8-2.9 L    Gaynell Face, MS, RD, LDN Inpatient Clinical Dietitian Pager: [phone] Weekend/After Hours: [phone]

## 2018-04-29 NOTE — Progress Notes (Signed)
[facility] Progress Note     Subjective: CC-  Patient states that she feels the same. She is tired, suppressed appetite, and continues to have lower abdominal pain. Having loose/watery stools.  CT reviewed with IR, unable to drain new fluid collections; plan to remove 2 of 3 drains today.  Objective: Vital signs in last 24 hours: Temp:  [98 F (36.7 C)-98.7 F (37.1 C)] 98.6 F (37 C) (11/19 0544) Pulse Rate:  [72-81] 77 (11/19 0544) Resp:  [12-16] 12 (11/19 0544) BP: (103-122)/(58-66) 103/66 (11/19 0544) SpO2:  [91 %-93 %] 93 % (11/19 0544) Last BM Date: 04/28/18  Intake/Output from previous day: 11/18 0701 - 11/19 0700 In: 2395.6 [P.O.:240; I.V.:1810.6; IV Piggyback:300] Out: 1636 [Urine:1600; Drains:36] Intake/Output this shift: No intake/output data recorded.  PE: Gen: Alert, NAD HEENT: EOM's intact, pupils equal and round Pulm: effort normal UUV:OZDGU, soft, +BS, no HSM, TTP LLQ/no peritonitis,drain x3withtracepurulent drainagein bags -#118cc, #29cc, #39cc Psych: A&Ox3 Skin: no rashes noted, warm and dry  Lab Results:  Recent Labs    04/28/18 0305 04/29/18 0324  WBC 10.2 9.8  HGB 7.7* 7.7*  HCT 24.9* 24.4*  PLT 348 339   BMET Recent Labs    04/28/18 0305 04/29/18 0324  NA 137 136  K 3.3* 3.6  CL 95* 94*  CO2 34* 33*  GLUCOSE 142* 130*  BUN 44* 49*  CREATININE 1.42* 1.41*  CALCIUM 8.2* 8.4*   PT/INR No results for input(s): LABPROT, INR in the last 72 hours. CMP     Component Value Date/Time   NA 136 04/29/2018 0324   K 3.6 04/29/2018 0324   CL 94 (L) 04/29/2018 0324   CO2 33 (H) 04/29/2018 0324   GLUCOSE 130 (H) 04/29/2018 0324   BUN 49 (H) 04/29/2018 0324   CREATININE 1.41 (H) 04/29/2018 0324   CALCIUM 8.4 (L) 04/29/2018 0324   PROT 4.5 (L) 04/28/2018 0305   ALBUMIN 1.7 (L) 04/28/2018 0305   AST 30 04/28/2018 0305   ALT 17 04/28/2018 0305   ALKPHOS 54 04/28/2018 0305   BILITOT 0.4 04/28/2018 0305    GFRNONAA 36 (L) 04/29/2018 0324   GFRAA 42 (L) 04/29/2018 0324   Lipase  No results found for: LIPASE     Studies/Results: Ct Abdomen Pelvis Wo Contrast  Result Date: 04/27/2018 CLINICAL DATA:  Re-evaluate diverticulitis with abscess drainage. EXAM: CT ABDOMEN AND PELVIS WITHOUT CONTRAST TECHNIQUE: Multidetector CT imaging of the abdomen and pelvis was performed following the standard protocol without IV contrast. COMPARISON:  CT scan April 23, 2018 FINDINGS: Lower chest: No acute abnormality. Hepatobiliary: No focal liver abnormality is seen. Status post cholecystectomy. No biliary dilatation. Pancreas: Unremarkable. No pancreatic ductal dilatation or surrounding inflammatory changes. Spleen: Normal in size without focal abnormality. Adrenals/Urinary Tract: There is a tiny nonobstructive stone in the lower pole the right kidney. No hydronephrosis or perinephric stranding. No ureteral stones. The bladder is normal. The adrenal glands are normal. Stomach/Bowel: The stomach is normal. There is a duodenal diverticulum off the distal third portion of the duodenum. The remainder of the small bowel is unremarkable. Colonic diverticuli are noted. A segment of sigmoid colon remains thickened. There is mild increased attenuation in the adjacent fat and some low level remaining diverticulitis is not excluded. Remainder of the colon is normal. The appendix is best seen on coronal images 52 through 57. The appendix is located just superior to a right lower quadrant drain. There is increased attenuation in the fat of the abdomen but  it does not appear to arise from the appendix. Vascular/Lymphatic: Atherosclerotic changes are seen in the abdominal aorta. No adenopathy. Reproductive: Status post hysterectomy. No adnexal masses. Other: There are 3 abscess drainage catheters. The first is seen in the pelvis on axial image 73 with no surrounding fluid. The second is seen in the upper central pelvis on axial image 59  with no surrounding drainable fluid collection. The third is seen in the right side of the pelvis on image 60. The previously identified fluid and air collection in this region has resolved. The mild fat stranding in this region is thought to be due to from the drainage catheter and recently drained abscess and not arising from the adjacent appendix. Recommend attention on follow-up. There is remaining fluid in the abdomen and pelvis. There is a small pocket of fluid measuring up to 2.8 cm on axial image 43. The distal duodenum courses through this fluid. There is another pocket of fluid in the left lower quadrant of the abdomen seen on axial image 52 measuring 4.8 x 3.7 cm. There is a small extension office fluid collection medially which abuts the drainage catheter. However, this fluid collection is clearly not being drained. Musculoskeletal: No acute or significant osseous findings. IMPRESSION: 1. There are 3 drainage catheters in the lower abdomen and pelvis. No significant fluid around any of the drainage catheters. However, there are 2 remaining fluid collections/abscesses. The first is in the region of the distal third portion of the duodenum measuring up to 2.8 cm. Duodenum appears to run through or immediately adjacent to this fluid. Another pocket of fluid is seen in the left lower quadrant of the abdomen measuring 4.8 x 3.7 cm. 2. Nonobstructive stone in the lower pole the right kidney. 3. Thickening remains in the sigmoid colon. Low-grade remaining inflammation of the sigmoid colon from diverticulitis is not excluded. 4. There is fat stranding in the region of the appendix. However, there is a adjacent drainage catheter and I suspect the stranding is secondary to the recent abscess in this region. Recommend clinical correlation and attention on follow-up. Electronically Signed   By: Dorise Bullion III M.D   On: 04/27/2018 22:51    Anti-infectives: Anti-infectives (From admission, onward)   Start      Dose/Rate Route Frequency Ordered Stop   04/27/18 1500  meropenem (MERREM) 1 g in sodium chloride 0.9 % 100 mL IVPB     1 g 200 mL/hr over 30 Minutes Intravenous Every 12 hours 04/27/18 1008     04/26/18 1300  metroNIDAZOLE (FLAGYL) IVPB 500 mg  Status:  Discontinued     500 mg 100 mL/hr over 60 Minutes Intravenous Every 6 hours 04/26/18 1257 04/27/18 1041   04/15/18 1700  meropenem (MERREM) 1 g in sodium chloride 0.9 % 100 mL IVPB  Status:  Discontinued     1 g 200 mL/hr over 30 Minutes Intravenous Every 8 hours 04/15/18 1534 04/27/18 1008   04/09/18 2200  meropenem (MERREM) 1 g in sodium chloride 0.9 % 100 mL IVPB  Status:  Discontinued     1 g 200 mL/hr over 30 Minutes Intravenous Every 8 hours 04/09/18 1244 04/14/18 0944   04/06/18 1300  meropenem (MERREM) 1 g in sodium chloride 0.9 % 100 mL IVPB  Status:  Discontinued     1 g 200 mL/hr over 30 Minutes Intravenous Every 12 hours 04/06/18 1145 04/09/18 1244   04/06/18 1145  meropenem (MERREM) 1 g in sodium chloride 0.9 % 100 mL  IVPB  Status:  Discontinued     1 g 200 mL/hr over 30 Minutes Intravenous Every 8 hours 04/06/18 1139 04/06/18 1145   04/06/18 0900  ciprofloxacin (CIPRO) IVPB 400 mg  Status:  Discontinued     400 mg 200 mL/hr over 60 Minutes Intravenous Every 12 hours 04/06/18 0732 04/06/18 1139   04/06/18 0300  metroNIDAZOLE (FLAGYL) IVPB 500 mg  Status:  Discontinued     500 mg 100 mL/hr over 60 Minutes Intravenous Every 8 hours 04/05/18 2335 04/06/18 1139   04/05/18 1930  ciprofloxacin (CIPRO) IVPB 400 mg     400 mg 200 mL/hr over 60 Minutes Intravenous  Once 04/05/18 1925 04/05/18 2217   04/05/18 1930  metroNIDAZOLE (FLAGYL) IVPB 500 mg     500 mg 100 mL/hr over 60 Minutes Intravenous  Once 04/05/18 1925 04/05/18 2102       Assessment/Plan N HLD 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 - on plavix (last dose 10/27) H/o CVA  Morbidly obese Chronic pain RUE DVT - was on IV heparin, now treatment dose  lovenox  Acute perforated sigmoid diverticulitis with multiple abscesses - CT scan 11/5 revealsat least 3 fluid collectionsin the lower abdomen/pelvis with previous perforated diverticulitis; fluid collections are7.3 x 5.8 cm in left lower quadrant, air-fluid collection measuring 8.2 x 5.9 cm in the right lower quadrant, and8.9 x 5.3 cm fluid collection in the pelvis in the pre rectal space which appears to contain stool and fluid -s/pIR perc drains x 2 - 04/16/18, culture with multiple organisms present/none predominant - CT scan 11/12 showed resolution of large pelvic abscess, minimal residual mesenteric abscess although this fluid appears to communicate with a second collection in the mesentery in the LEFT mid abdomen adjacent to a small bowel loop measuring 4.4 x 3.5 cm, and persistent large extraluminal gas collection in the RIGHT pelvis 8.2 x 5.0 x 6.8 cm. -S/p3rdIR drain 11/13, culture growing FEW CANDIDA TROPICALIS, report pending - CT scan 11/17 showed 3 drainage catheters with no significant surrounding fluid, but there are 2 remaining fluid collections/abscesses in the pelvis with the largest 4.8 x 3.7 cm  ID -currently merrem 10/27>>, flagyl 11/16>>11/17 VTE -SCDs, lovenox FEN -IVF,soft diet, TPN. Prealbumin 7.3 (11/18)  Plan:IR to remove 2 of 3 drains today. Unable to drain new fluid collections noted on CT scan yesterday, the next step may be surgery. Will discus timing with MD.   LOS: 48 days    Wellington [place] , [facility] Surgery 04/29/2018, 8:35 AM Pager: [phone] Mon 7:00 am -11:30 AM Tues-Fri 7:00 am-4:30 pm Sat-Sun 7:00 am-11:30 am

## 2018-04-29 NOTE — Progress Notes (Signed)
PROGRESS NOTE    [name]  WUJ:[id number] DOB: 1946-04-10 DOA: 04/05/2018 PCP: [name], MD  Outpatient Specialists: None   Brief Narrative:  72 year old female with past medical history of hypertension, type 2 diabetes mellitus chronic kidney disease rheumatoid arthritis coronary artery disease S/P CABG on Plavix who was admitted with abdominal pain and found to have sigmoid diverticulitis on April 05, 2018 with perforation and abscess.  Initially was felt to be improving on IV Merrem but she continued to have abdominal pain. CT scan of the abdomen and pelvis was obtained April 15, 2018 and was significant for worsening diverticulitis with abdominal fluid collections.  Repeat CT done 04/27/2018 showing remaining fluid collection/abscesses despite the drains. Thickening remains in the sigmoid colon. Low-grade remaining inflammation of the sigmoid colon from diverticulitis is not excluded. There is fat stranding in the region of the appendix. However, there is a adjacent drainage catheter and I suspect the stranding is secondary to the recent abscess in this region.  Patient this time complaining of some abdominal discomfort.  Assessment & Plan:   Principal Problem:   Diverticulitis of large intestine with perforation and abscess Active Problems:   DM (diabetes mellitus) ([place])   HTN (hypertension)   Rheumatoid arthritis ([place])   Anemia   Renal insufficiency   Sepsis due to undetermined organism ([place])   CKD (chronic kidney disease) stage 3, GFR 30-59 ml/min ([facility])   Fibromyalgia   Acute renal failure superimposed on stage 3 chronic kidney disease ([place])    1.  Perforated sigmoid diverticulitis/abdominal abscess: CT on 04/27/2018 showing that the abscess/fluid collections are still present despite the drains.  Seen by surgeon.  They are planning for surgery tomorrow. On TPN. 2.  Sepsis:Patient improving on IV meropenem. 3.  Acute right upper extremity DVT extremity due to  PICC line this was just discontinued April 16, 2018 still swollen but improving there is no warmth or redness.  She was initially on heparin now she is on Lovenox 4.  Acute kidney injury with chronic kidney disease stage III this has resolved she is back to baseline with creatinine of 1.2, creatinine is 1.41 today. On IV hydration. 5.  Fibromyalgia continue Lyrica 6.  Hypertension improved.  Continue current medications.  Continue to monitor blood pressure closely and adjust medications as needed. 7.  Type 2 diabetes mellitus well controlled.  Continue Accu-Cheks and and sliding scale insulin. 8.  Debility.  PT has been consulted they recommend SNF.  Patient stated to not be compliant with physical therapy 9.  Acute on chronic anemia she received 2 units of packed RBC.  Hemoglobin 7.7.  Continue to monitor and transfuse for hemoglobin less than 7. 10.  Depression patient recently started on Lexapro 11.  Paroxysmal atrial tachycardia.  She has not had any more episodes of PAT since that of April 22, 2018 echocardiogram that was done during showed preserved ejection fraction her beta-blocker was increased 12.  Hypokalemia resolved with repleted potassium   DVT prophylaxis: Subcutaneous Lovenox due to risk of DVT Code Status: Full Family Communication: Husband at bedside  Disposition Plan: Physical therapy recommended SNF Consultants:   General surgery   interventional radiology  Procedures: Echocardiogram Drains  percutaneous  x2 drainage of the abdominal area Right arm PICC line Left arm PICC line   Antimicrobials:  Meropenem  Floconazole   SUBJECTIVE: Patient seen at bedside husband in the room.  She is still having some abdominal pain. She is still on TPN.  April 27, 2018: Patient seen and examined at bedside with her husband in the room.  He complained that she was fed solid food which included string beans and arise but she threw it up.  She did a little better  yesterday when she had more liquid food like rates juice and milk.  She still on TPN and IV fluid.  April 28, 2018: Patient seen and examined at bedside husband in the room patient is sitting in the bed on the chair and she is complaining of being uncomfortable in her chair and wants to go back to the bed.  Also she is been having diarrhea and a rectal tube was put in and she is complaining that it is very uncomfortable.  She also has some redness in the buttock area that is being protected from further breakdown  04/29/18: She is complaining of some abdominal discomfort.  Seen by surgeon.  Plan for surgery in a.m.  Objective: Vitals:   04/28/18 2215 04/29/18 0544 04/29/18 0926 04/29/18 1442  BP: (!) 122/58 103/66 119/71 (!) 146/67  Pulse: 81 77 82 76  Resp: 16 12  20   Temp: 98 F (36.7 C) 98.6 F (37 C)  98.4 F (36.9 C)  TempSrc: Oral Oral  Oral  SpO2: 91% 93%  94%  Weight:      Height:        Intake/Output Summary (Last 24 hours) at 04/29/2018 1628 Last data filed at 04/29/2018 1400 Gross per 24 hour  Intake 2275.64 ml  Output 3348 ml  Net -1072.36 ml   Filed Weights   04/19/18 0500 04/23/18 0500 04/24/18 0500  Weight: 123 kg 118.2 kg 118.3 kg    Examination:  General exam: morbidly obese she is lying in bed eyes closed responsive to being spoken to Respiratory system: Clear to auscultation. Respiratory effort normal. Cardiovascular system: S1 & S2 heard, RRR. No JVD, murmurs, rubs, gallops or clicks. No pedal edema. Gastrointestinal system: Abdomen is nondistended, soft.  Mild nonspecific tenderness without any guarding or rebound.  Has a drains in place, dressing in place.  No organomegaly or masses felt. Normal bowel sounds heard.   Central nervous system: Alert and oriented. No focal neurological deficits. Extremities: Symmetric 5 x 5 power. Skin: Intertrigo in the breasts and abdominal folds, lesions or ulcers Psychiatry: Judgement and insight appear normal.  Mood & affect appropriate are  poor.     Data Reviewed: I have personally reviewed following labs and imaging studies  CBC: Recent Labs  Lab 04/24/18 0833 04/25/18 0619 04/26/18 0405 04/27/18 0321 04/28/18 0305 04/29/18 0324  WBC 8.8 8.9 8.0 8.9 10.2 9.8  NEUTROABS 6.4  --   --   --  7.7  --   HGB 8.5* 8.1* 8.1* 8.0* 7.7* 7.7*  HCT 26.4* 26.4* 25.5* 26.5* 24.9* 24.4*  MCV 94.0 95.0 94.4 95.0 93.6 93.1  PLT 352 380 351 354 348 092   Basic Metabolic Panel: Recent Labs  Lab 04/24/18 0833 04/25/18 0619 04/26/18 0405 04/27/18 0321 04/28/18 0305 04/29/18 0324  NA 139 138 136 138 137 136  K 3.4* 3.4* 3.6 3.6 3.3* 3.6  CL 92* 93* 91* 92* 95* 94*  CO2 37* 37* 38* 36* 34* 33*  GLUCOSE 142* 129* 124* 131* 142* 130*  BUN 20 26* 29* 37* 44* 49*  CREATININE 1.07* 1.18* 1.26* 1.49* 1.42* 1.41*  CALCIUM 7.6* 7.7* 7.9* 8.3* 8.2* 8.4*  MG 0.9* 1.5*  --  1.6* 1.6* 1.9  PHOS 3.7  --   --  3.5 3.9  --    GFR: Estimated Creatinine Clearance: 46.1 mL/min (A) (by C-G formula based on SCr of 1.41 mg/dL (H)). Liver Function Tests: Recent Labs  Lab 04/24/18 0833 04/28/18 0305  AST 55* 30  ALT 28 17  ALKPHOS 59 54  BILITOT 0.6 0.4  PROT 4.7* 4.5*  ALBUMIN 1.7* 1.7*   No results for input(s): LIPASE, AMYLASE in the last 168 hours. No results for input(s): AMMONIA in the last 168 hours. Coagulation Profile: Recent Labs  Lab 04/23/18 0329  INR 1.25   Cardiac Enzymes: No results for input(s): CKTOTAL, CKMB, CKMBINDEX, TROPONINI in the last 168 hours. BNP (last 3 results) No results for input(s): PROBNP in the last 8760 hours. HbA1C: No results for input(s): HGBA1C in the last 72 hours. CBG: Recent Labs  Lab 04/28/18 1151 04/28/18 1651 04/29/18 0017 04/29/18 0545 04/29/18 1148  GLUCAP 181* 150* 135* 145* 155*   Lipid Profile: Recent Labs    04/28/18 0305  TRIG 113   Thyroid Function Tests: No results for input(s): TSH, T4TOTAL, FREET4, T3FREE, THYROIDAB in the  last 72 hours. Anemia Panel: No results for input(s): VITAMINB12, FOLATE, FERRITIN, TIBC, IRON, RETICCTPCT in the last 72 hours. Urine analysis:    Component Value Date/Time   COLORURINE YELLOW 04/05/2018 1924   APPEARANCEUR CLEAR 04/05/2018 1924   LABSPEC 1.021 04/05/2018 1924   PHURINE 5.0 04/05/2018 1924   GLUCOSEU NEGATIVE 04/05/2018 1924   HGBUR NEGATIVE 04/05/2018 1924   BILIRUBINUR NEGATIVE 04/05/2018 [place] NEGATIVE 04/05/2018 1924   PROTEINUR NEGATIVE 04/05/2018 1924   UROBILINOGEN 0.2 06/20/2013 1832   NITRITE NEGATIVE 04/05/2018 1924   LEUKOCYTESUR NEGATIVE 04/05/2018 1924   Sepsis Labs: @LABRCNTIP (procalcitonin:4,lacticidven:4)  ) Recent Results (from the past 240 hour(s))  Aerobic/Anaerobic Culture (surgical/deep wound)     Status: None   Collection Time: 04/23/18  3:20 PM  Result Value Ref Range Status   Specimen Description ABSCESS RIGHT LOWER QUADRANT  Final   Special Requests Normal  Final   Gram Stain NO WBC SEEN NO ORGANISMS SEEN   Final   Culture   Final    FEW CANDIDA TROPICALIS NO ANAEROBES ISOLATED Performed at [facility], [place] [address] [place], [place] [address]    Report Status 04/28/2018 FINAL  Final  C difficile quick scan w PCR reflex     Status: Abnormal   Collection Time: 04/28/18  9:01 PM  Result Value Ref Range Status   C Diff antigen POSITIVE (A) NEGATIVE Final   C Diff toxin NEGATIVE NEGATIVE Final   C Diff interpretation Results are indeterminate. See PCR results.  Final    Comment: Performed at [facility], [place] [address] [place], [place] [address]  C. Diff by PCR, Reflexed     Status: None   Collection Time: 04/28/18  9:01 PM  Result Value Ref Range Status   Toxigenic C. Difficile by PCR NEGATIVE NEGATIVE Final    Comment: Patient is colonized with non toxigenic C. difficile. May not need treatment unless significant symptoms are present. Performed at [facility], [place] [address]  [place], [place] [address]          Radiology Studies: Ct Abdomen Pelvis Wo Contrast  Result Date: 04/27/2018 CLINICAL DATA:  Re-evaluate diverticulitis with abscess drainage. EXAM: CT ABDOMEN AND PELVIS WITHOUT CONTRAST TECHNIQUE: Multidetector CT imaging of the abdomen and pelvis was performed following the standard protocol without IV contrast. COMPARISON:  CT scan April 23, 2018  FINDINGS: Lower chest: No acute abnormality. Hepatobiliary: No focal liver abnormality is seen. Status post cholecystectomy. No biliary dilatation. Pancreas: Unremarkable. No pancreatic ductal dilatation or surrounding inflammatory changes. Spleen: Normal in size without focal abnormality. Adrenals/Urinary Tract: There is a tiny nonobstructive stone in the lower pole the right kidney. No hydronephrosis or perinephric stranding. No ureteral stones. The bladder is normal. The adrenal glands are normal. Stomach/Bowel: The stomach is normal. There is a duodenal diverticulum off the distal third portion of the duodenum. The remainder of the small bowel is unremarkable. Colonic diverticuli are noted. A segment of sigmoid colon remains thickened. There is mild increased attenuation in the adjacent fat and some low level remaining diverticulitis is not excluded. Remainder of the colon is normal. The appendix is best seen on coronal images 52 through 57. The appendix is located just superior to a right lower quadrant drain. There is increased attenuation in the fat of the abdomen but it does not appear to arise from the appendix. Vascular/Lymphatic: Atherosclerotic changes are seen in the abdominal aorta. No adenopathy. Reproductive: Status post hysterectomy. No adnexal masses. Other: There are 3 abscess drainage catheters. The first is seen in the pelvis on axial image 73 with no surrounding fluid. The second is seen in the upper central pelvis on axial image 59 with no surrounding drainable fluid collection. The third is seen in the  right side of the pelvis on image 60. The previously identified fluid and air collection in this region has resolved. The mild fat stranding in this region is thought to be due to from the drainage catheter and recently drained abscess and not arising from the adjacent appendix. Recommend attention on follow-up. There is remaining fluid in the abdomen and pelvis. There is a small pocket of fluid measuring up to 2.8 cm on axial image 43. The distal duodenum courses through this fluid. There is another pocket of fluid in the left lower quadrant of the abdomen seen on axial image 52 measuring 4.8 x 3.7 cm. There is a small extension office fluid collection medially which abuts the drainage catheter. However, this fluid collection is clearly not being drained. Musculoskeletal: No acute or significant osseous findings. IMPRESSION: 1. There are 3 drainage catheters in the lower abdomen and pelvis. No significant fluid around any of the drainage catheters. However, there are 2 remaining fluid collections/abscesses. The first is in the region of the distal third portion of the duodenum measuring up to 2.8 cm. Duodenum appears to run through or immediately adjacent to this fluid. Another pocket of fluid is seen in the left lower quadrant of the abdomen measuring 4.8 x 3.7 cm. 2. Nonobstructive stone in the lower pole the right kidney. 3. Thickening remains in the sigmoid colon. Low-grade remaining inflammation of the sigmoid colon from diverticulitis is not excluded. 4. There is fat stranding in the region of the appendix. However, there is a adjacent drainage catheter and I suspect the stranding is secondary to the recent abscess in this region. Recommend clinical correlation and attention on follow-up. Electronically Signed   By: Dorise Bullion III M.D   On: 04/27/2018 22:51        Scheduled Meds: . acetaminophen  1,000 mg Oral TID  . Chlorhexidine Gluconate Cloth  6 each Topical Daily  . escitalopram  20 mg  Oral QHS  . feeding supplement  1 Container Oral TID BM  . furosemide  40 mg Intravenous BID  . insulin aspart  0-15 Units Subcutaneous Q6H  .  latanoprost  1 drop Both Eyes QHS  . metoprolol tartrate  37.5 mg Oral BID  . nystatin   Topical TID  . pantoprazole  40 mg Oral Daily  . pravastatin  40 mg Oral QHS  . pregabalin  75 mg Oral QHS  . sodium chloride flush  5 mL Intracatheter Q8H  . timolol  1 drop Both Eyes BID   Continuous Infusions: . fluconazole (DIFLUCAN) IV 400 mg (04/29/18 1141)  . meropenem (MERREM) IV 1 g (04/29/18 1526)  . TPN ADULT (ION) 80 mL/hr at 04/28/18 1712  . TPN ADULT (ION)       LOS: 22 days    Time spent: 70 minutes    Jsoeph Podesta, MD [facility] Pager [phone]  If 7PM-7AM, please contact night-coverage [email or web address] Password [facility] 04/29/2018, 4:28 PM

## 2018-04-29 NOTE — Progress Notes (Signed)
PHARMACY - ADULT TOTAL PARENTERAL NUTRITION CONSULT NOTE   Pharmacy Consult for TPN Indication: perforated diverticulitis  Patient Measurements: Height: 5' 5.5" (166.4 cm) Weight: 260 lb 12.9 oz (118.3 kg) IBW/kg (Calculated) : 58.15 TPN AdjBW (KG): 75.1 Body mass index is 42.74 kg/m.  Assessment: Admitted 10/26 with acute sigmoid diverticulitis without perf or abscess.   PMH: HTN, HLD, CKD3, RA, DM, CAD, CVA, CRI, fibromyalgia,morbid obesity, chronic pain, RUE DVT  GI: Acute perforated sigmoid diverticulitis with multiple abscesses. Patient has continued to have inadequate oral intake since admission. Soft diet with poor intake. Complains of abdominal pan and suppressed appetite w/ some nausea. Pre-albumin 6.6>7.3. LBM 11/16. Drain output 50. Per RD, pt is high risk for refeeding. Ensure Surgery charted x 1 on 11/17. - po PPI  11/5: at least 3 fluid collectionsin the lower abdomen/pelvis with previous perforated diverticulitis; 11/12 CT: resolution of large pelvic abscess, minimal residual mesenteric abscess although this fluid appears to communicate with a second collection in the mesentery in the LEFT mid abdomen adjacent to a small bowel loop, and persistent large extraluminal gas collection in the RIGHT pelvis-place additional 3rd drain.  Endo: A1C 5.9. (glipizide PTA)  DM, CBGs 143-181 Insulin requirements in the past 24 hours: 9 units  Lytes: K up to 3.6. Cl low, bicarb high, Mg 1.9. Ca adjusts to 9.6. Remains at risk for refeeding   Renal:CKD3. Scr 1.41 (BL ~ 1.07). BUN 33. (UOP 0.57ml/kg/hr) Pulm: [place] Cards: VSS on IV Lasix, metoprolol, Pravastatin Hepatobil: LFTs normalized  Neuro: RA, fibromyalgia, h/o CVA. Scheduled tylenol, Lexapro, Lyrica [id number] from perforated sigmoid diverticulitis on Merrem, WBC 9.8, afebrile  Abscess from 11/13 growing few candida tropicalis  Anticoag: RUE PICC DVT on LMWH  TPN Access: removed 11/6 for DVT,  new 04/23/18 TPN start  date:04/23/18 Nutritional Goals (per RD recommendation on 04/25/18): [id number] Protein:  85-100 Fluid: 1.6-1.8L  Goal TPN rate is 80 ml/hr   Current Nutrition: Clear liquids, new TPN 11/13.   Plan:  Continue TPN at 40mL/hr. - TPN (at goal 80 ml/hr) provides 96 g of protein, 326 g of dextrose, and 38.4 g of lipids which provides 1877 kCals per day, meeting 100% of patient needs  Electrolytes in TPN: adjust for refeeding. Max Cl, Increase K  Add MVI, trace elements  SSI and adjust as needed.  Monitor TPN labs Mon/Thurs and prn Monitor oral intake   Albertina Parr, PharmD., BCPS Clinical Pharmacist Clinical phone for 04/29/18 until 3:30pm: [phone] If after 3:30pm, please refer to [facility] for unit-specific pharmacist

## 2018-04-29 NOTE — Progress Notes (Signed)
Physical Therapy Treatment Patient Details Name: [name] MRN: [id number] DOB: [birth year] Today's Date: 04/29/2018    History of Present Illness [name]  is a 72 y.o. female, w hypertension, hyperlipidemia, dm2, pvd, CVA, OSA, Rheumatoid arthritis apparently c/o LLQ pain over the past several weeks, just started on abx yesterday, pt notes subjective fever as well as nausea.  Pt denies emesis, constipation, diarrhea, brbpr, black stool.  Pt presented due to worsening abdominal pain today. Found to have acute perforated sigmoid diverticulitis with multiple abscesses. Pt is s/p abdominal abscess drains X2.     PT Comments    Patient's tolerance to treatment today was poor.  Patient was in bed with eyes closed moaning in pain with husband present upon PT arrival.  Patient reported pain in stomach today and required increased time due to frequent rest breaks with all activities.  Treatment was limited to LE strengthening exercises in supine due to pt's continued complaints of pain and intermittent nausea.  Patient would continue to benefit from acute care PT to progress towards stated goals and improve functional mobility. Patient continues to be a good candidate for SNF destination based on current functional status.   Follow Up Recommendations  SNF     Equipment Recommendations  None recommended by PT    Recommendations for Other Services       Precautions / Restrictions Precautions Precautions: Fall;Other (comment) Precaution Comments: 2 abscess drains Restrictions Weight Bearing Restrictions: No    Mobility  Bed Mobility Overal bed mobility: Needs Assistance Bed Mobility: (Scooting up in bed)           General bed mobility comments: Patient required total assistance+2 with therapist utilizing bed pad to scoot up in bed.  Transfers                    Ambulation/Gait                 Stairs             Wheelchair Mobility    Modified  Rankin (Stroke Patients Only)       Balance                                            Cognition Arousal/Alertness: Awake/alert Behavior During Therapy: WFL for tasks assessed/performed Overall Cognitive Status: Within Functional Limits for tasks assessed                                        Exercises General Exercises - Lower Extremity Ankle Circles/Pumps: AROM;Both;20 reps;Supine Quad Sets: AROM;Both;Supine(2x10) Heel Slides: AROM;Both;10 reps;Supine    General Comments        Pertinent Vitals/Pain Pain Assessment: 0-10 Pain Score: 7  Pain Location: stomach and low back Pain Descriptors / Indicators: Moaning;Discomfort;Grimacing;Constant Pain Intervention(s): Limited activity within patient's tolerance;Monitored during session    Home Living                      Prior Function            PT Goals (current goals can now be found in the care plan section) Acute Rehab PT Goals Patient Stated Goal: none stated PT Goal Formulation: With patient/family Time For Goal Achievement: 04/28/18 Potential to Achieve Goals:  Fair Progress towards PT goals: Progressing toward goals    Frequency    Min 3X/week      PT Plan Current plan remains appropriate    Co-evaluation              AM-PAC PT "6 Clicks" Daily Activity  Outcome Measure  Difficulty turning over in bed (including adjusting bedclothes, sheets and blankets)?: Unable Difficulty moving from lying on back to sitting on the side of the bed? : Unable Difficulty sitting down on and standing up from a chair with arms (e.g., wheelchair, bedside commode, etc,.)?: Unable Help needed moving to and from a bed to chair (including a wheelchair)?: A Lot Help needed walking in hospital room?: Total Help needed climbing 3-5 steps with a railing? : Total 6 Click Score: 7    End of Session   Activity Tolerance: Patient limited by pain Patient left: in bed;with call  bell/phone within reach;with bed alarm set;with nursing/sitter in room;with family/visitor present Nurse Communication: Mobility status PT Visit Diagnosis: Unsteadiness on feet (R26.81);Muscle weakness (generalized) (M62.81)     Time: 3646-8032 PT Time Calculation (min) (ACUTE ONLY): 22 min  Charges:  $Therapeutic Exercise: 8-22 mins                     [address] SPTA   Raekwon Winkowski 04/29/2018, 1:10 PM

## 2018-04-29 NOTE — Consult Note (Signed)
[place] Nurse requested for preoperative stoma site marking.  Surgery tomorrow.  Marking from last week gone and drain present in LUQ.  Will re-mark for colostomy and ileostomy  Discussed surgical procedure and stoma creation with patient and family. Spouse at bedside.   Explained role of the [place] nurse team.  Provided the patient with educational booklet and provided samples of pouching options.  Answered patient and family questions.   Examined patient lying, and read marking from last week  in order to place the marking in the patient's visual field, away from any creases or abdominal contour issues and within the rectus muscle.  Patient in a lot of pain and unable to move much at this time Due to body habitus, marking above umbilicus.  Marked for colostomy in the LLQ  9 cm to the left of the umbilicus and 4 _cm above the umbilicus.  Marked for ileostomy in the RLQ  9 cm to the right of the umbilicus and  4 cm above the umbilicus.    Patient's abdomen cleansed with CHG wipes at site markings, allowed to air dry prior to marking.Covered mark with thin film transparent dressing to preserve mark until date of surgery.   [place] Nurse team will follow up with patient after surgery for continue ostomy care and teaching.   Domenic Moras MSN, RN, FNP-BC CWON Wound, Ostomy, Continence Nurse Pager [phone]

## 2018-04-30 ENCOUNTER — Encounter (HOSPITAL_COMMUNITY): Payer: Self-pay | Admitting: Orthopedic Surgery

## 2018-04-30 ENCOUNTER — Inpatient Hospital Stay (HOSPITAL_COMMUNITY): Payer: Medicare Other | Admitting: Certified Registered Nurse Anesthetist

## 2018-04-30 ENCOUNTER — Encounter (HOSPITAL_COMMUNITY)
Admission: EM | Disposition: A | Discharge status: Skilled Nursing Facility | Payer: Self-pay | Source: Home / Self Care | Attending: Internal Medicine

## 2018-04-30 HISTORY — PX: LAPAROTOMY: SHX154

## 2018-04-30 LAB — CBC
HCT: 24.4 % — ABNORMAL LOW (ref 36.0–46.0)
HEMOGLOBIN: 7.5 g/dL — AB (ref 12.0–15.0)
MCH: 29 pg (ref 26.0–34.0)
MCHC: 30.7 g/dL (ref 30.0–36.0)
MCV: 94.2 fL (ref 80.0–100.0)
PLATELETS: 316 10*3/uL (ref 150–400)
RBC: 2.59 MIL/uL — AB (ref 3.87–5.11)
RDW: 16.8 % — ABNORMAL HIGH (ref 11.5–15.5)
WBC: 8.9 10*3/uL (ref 4.0–10.5)
nRBC: 0.7 % — ABNORMAL HIGH (ref 0.0–0.2)

## 2018-04-30 LAB — GLUCOSE, CAPILLARY
GLUCOSE-CAPILLARY: 150 mg/dL — AB (ref 70–99)
GLUCOSE-CAPILLARY: 325 mg/dL — AB (ref 70–99)
Glucose-Capillary: 147 mg/dL — ABNORMAL HIGH (ref 70–99)
Glucose-Capillary: 223 mg/dL — ABNORMAL HIGH (ref 70–99)
Glucose-Capillary: 254 mg/dL — ABNORMAL HIGH (ref 70–99)

## 2018-04-30 LAB — BASIC METABOLIC PANEL
Anion gap: 9 (ref 5–15)
BUN: 46 mg/dL — AB (ref 8–23)
CO2: 32 mmol/L (ref 22–32)
CREATININE: 1.38 mg/dL — AB (ref 0.44–1.00)
Calcium: 8.4 mg/dL — ABNORMAL LOW (ref 8.9–10.3)
Chloride: 98 mmol/L (ref 98–111)
GFR, EST AFRICAN AMERICAN: 43 mL/min — AB (ref >60)
GFR, EST NON AFRICAN AMERICAN: 37 mL/min — AB (ref >60)
Glucose, Bld: 140 mg/dL — ABNORMAL HIGH (ref 70–99)
POTASSIUM: 3.8 mmol/L (ref 3.5–5.1)
SODIUM: 139 mmol/L (ref 135–145)

## 2018-04-30 LAB — PREPARE RBC (CROSSMATCH)

## 2018-04-30 SURGERY — LAPAROTOMY, EXPLORATORY
Anesthesia: General | Site: Abdomen

## 2018-04-30 MED ORDER — DEXAMETHASONE SODIUM PHOSPHATE 10 MG/ML IJ SOLN
INTRAMUSCULAR | Status: DC | PRN
  Administered 2018-04-30: 10 mg via INTRAVENOUS

## 2018-04-30 MED ORDER — METOPROLOL TARTRATE 5 MG/5ML IV SOLN
5.0000 mg | Freq: Four times a day (QID) | INTRAVENOUS | Status: DC
  Administered 2018-04-30 – 2018-05-05 (×20): 5 mg via INTRAVENOUS
  Filled 2018-04-30 (×20): qty 5

## 2018-04-30 MED ORDER — PHENYLEPHRINE 40 MCG/ML (10ML) SYRINGE FOR IV PUSH (FOR BLOOD PRESSURE SUPPORT)
PREFILLED_SYRINGE | INTRAVENOUS | Status: DC | PRN
  Administered 2018-04-30: 160 ug via INTRAVENOUS

## 2018-04-30 MED ORDER — METOPROLOL TARTRATE 5 MG/5ML IV SOLN: 15.0000 mg | Freq: Two times a day (BID) | INTRAVENOUS | Status: DC

## 2018-04-30 MED ORDER — FENTANYL CITRATE (PF) 100 MCG/2ML IJ SOLN
INTRAMUSCULAR | Status: AC
  Filled 2018-04-30: qty 2

## 2018-04-30 MED ORDER — HYDROMORPHONE HCL 1 MG/ML IJ SOLN
INTRAMUSCULAR | Status: AC
  Filled 2018-04-30: qty 1

## 2018-04-30 MED ORDER — PROPOFOL 10 MG/ML IV BOLUS
INTRAVENOUS | Status: DC | PRN
  Administered 2018-04-30: 240 mg via INTRAVENOUS

## 2018-04-30 MED ORDER — ACETAMINOPHEN 10 MG/ML IV SOLN
1000.0000 mg | Freq: Four times a day (QID) | INTRAVENOUS | Status: AC
  Administered 2018-04-30 – 2018-05-01 (×4): 1000 mg via INTRAVENOUS
  Filled 2018-04-30 (×4): qty 100

## 2018-04-30 MED ORDER — SUGAMMADEX SODIUM 200 MG/2ML IV SOLN
INTRAVENOUS | Status: DC | PRN
  Administered 2018-04-30: 480 mg via INTRAVENOUS

## 2018-04-30 MED ORDER — FENTANYL CITRATE (PF) 100 MCG/2ML IJ SOLN
INTRAMUSCULAR | Status: DC | PRN
  Administered 2018-04-30: 50 ug via INTRAVENOUS
  Administered 2018-04-30: 100 ug via INTRAVENOUS
  Administered 2018-04-30 (×4): 50 ug via INTRAVENOUS

## 2018-04-30 MED ORDER — FENTANYL CITRATE (PF) 100 MCG/2ML IJ SOLN
25.0000 ug | INTRAMUSCULAR | Status: DC | PRN
  Administered 2018-04-30 (×3): 50 ug via INTRAVENOUS

## 2018-04-30 MED ORDER — LIDOCAINE 2% (20 MG/ML) 5 ML SYRINGE
INTRAMUSCULAR | Status: DC | PRN
  Administered 2018-04-30: 100 mg via INTRAVENOUS

## 2018-04-30 MED ORDER — INSULIN ASPART 100 UNIT/ML ~~LOC~~ SOLN
5.0000 [IU] | Freq: Once | SUBCUTANEOUS | Status: AC
  Administered 2018-04-30: 5 [IU] via SUBCUTANEOUS

## 2018-04-30 MED ORDER — FLUCONAZOLE IN SODIUM CHLORIDE 400-0.9 MG/200ML-% IV SOLN
400.0000 mg | INTRAVENOUS | Status: DC
  Filled 2018-04-30: qty 200

## 2018-04-30 MED ORDER — SUCCINYLCHOLINE CHLORIDE 200 MG/10ML IV SOSY
PREFILLED_SYRINGE | INTRAVENOUS | Status: DC | PRN
  Administered 2018-04-30: 120 mg via INTRAVENOUS

## 2018-04-30 MED ORDER — FENTANYL CITRATE (PF) 250 MCG/5ML IJ SOLN
INTRAMUSCULAR | Status: AC
  Filled 2018-04-30: qty 5

## 2018-04-30 MED ORDER — PROPOFOL 10 MG/ML IV BOLUS
INTRAVENOUS | Status: AC
  Filled 2018-04-30: qty 20

## 2018-04-30 MED ORDER — ENOXAPARIN SODIUM 120 MG/0.8ML ~~LOC~~ SOLN
120.0000 mg | Freq: Two times a day (BID) | SUBCUTANEOUS | Status: DC
  Administered 2018-05-01 – 2018-05-05 (×9): 120 mg via SUBCUTANEOUS
  Filled 2018-04-30 (×11): qty 0.8

## 2018-04-30 MED ORDER — FAMOTIDINE IN NACL 20-0.9 MG/50ML-% IV SOLN
20.0000 mg | Freq: Two times a day (BID) | INTRAVENOUS | Status: DC
  Administered 2018-04-30: 20 mg via INTRAVENOUS
  Filled 2018-04-30 (×2): qty 50

## 2018-04-30 MED ORDER — SODIUM CHLORIDE 0.9% IV SOLUTION: Freq: Once | INTRAVENOUS | Status: DC

## 2018-04-30 MED ORDER — ROCURONIUM BROMIDE 10 MG/ML (PF) SYRINGE
PREFILLED_SYRINGE | INTRAVENOUS | Status: DC | PRN
  Administered 2018-04-30: 20 mg via INTRAVENOUS
  Administered 2018-04-30: 50 mg via INTRAVENOUS

## 2018-04-30 MED ORDER — LACTATED RINGERS IV SOLN
INTRAVENOUS | Status: DC | PRN
  Administered 2018-04-30: 10:00:00 via INTRAVENOUS

## 2018-04-30 MED ORDER — CHLORHEXIDINE GLUCONATE CLOTH 2 % EX PADS
6.0000 | MEDICATED_PAD | Freq: Once | CUTANEOUS | Status: AC
  Administered 2018-04-30: 6 via TOPICAL

## 2018-04-30 MED ORDER — METHOCARBAMOL 1000 MG/10ML IJ SOLN
500.0000 mg | Freq: Four times a day (QID) | INTRAVENOUS | Status: DC | PRN
  Administered 2018-05-03 (×2): 500 mg via INTRAVENOUS
  Filled 2018-04-30 (×3): qty 5

## 2018-04-30 MED ORDER — TRAVASOL 10 % IV SOLN
INTRAVENOUS | Status: AC
  Administered 2018-04-30: 17:00:00 via INTRAVENOUS
  Filled 2018-04-30: qty 960

## 2018-04-30 MED ORDER — ONDANSETRON HCL 4 MG/2ML IJ SOLN: 4.0000 mg | Freq: Once | INTRAMUSCULAR | Status: DC | PRN

## 2018-04-30 MED ORDER — SUGAMMADEX SODIUM 500 MG/5ML IV SOLN
INTRAVENOUS | Status: AC
  Filled 2018-04-30: qty 5

## 2018-04-30 MED ORDER — HYDROMORPHONE HCL 1 MG/ML IJ SOLN
0.2500 mg | INTRAMUSCULAR | Status: DC | PRN
  Administered 2018-04-30 (×2): 0.5 mg via INTRAVENOUS

## 2018-04-30 MED ORDER — 0.9 % SODIUM CHLORIDE (POUR BTL) OPTIME
TOPICAL | Status: DC | PRN
  Administered 2018-04-30 (×2): 1000 mL

## 2018-04-30 MED ORDER — ONDANSETRON HCL 4 MG/2ML IJ SOLN
INTRAMUSCULAR | Status: DC | PRN
  Administered 2018-04-30: 4 mg via INTRAVENOUS

## 2018-04-30 MED ORDER — HYDROMORPHONE HCL 1 MG/ML IJ SOLN
0.2500 mg | INTRAMUSCULAR | Status: AC | PRN
  Administered 2018-04-30 (×4): 0.5 mg via INTRAVENOUS

## 2018-04-30 MED ORDER — MORPHINE SULFATE (PF) 2 MG/ML IV SOLN
2.0000 mg | INTRAVENOUS | Status: DC | PRN
  Administered 2018-04-30 – 2018-05-01 (×5): 4 mg via INTRAVENOUS
  Administered 2018-05-02 – 2018-05-05 (×7): 2 mg via INTRAVENOUS
  Filled 2018-04-30: qty 2
  Filled 2018-04-30 (×2): qty 1
  Filled 2018-04-30: qty 2
  Filled 2018-04-30 (×2): qty 1
  Filled 2018-04-30: qty 2
  Filled 2018-04-30: qty 1
  Filled 2018-04-30: qty 2
  Filled 2018-04-30: qty 1
  Filled 2018-04-30 (×2): qty 2
  Filled 2018-04-30: qty 1

## 2018-04-30 MED ORDER — MIDAZOLAM HCL 2 MG/2ML IJ SOLN
INTRAMUSCULAR | Status: AC
  Filled 2018-04-30: qty 2

## 2018-04-30 MED ORDER — INSULIN ASPART 100 UNIT/ML ~~LOC~~ SOLN
SUBCUTANEOUS | Status: AC
  Filled 2018-04-30: qty 1

## 2018-04-30 SURGICAL SUPPLY — 53 items
BIOPATCH RED 1 DISK 7.0 (GAUZE/BANDAGES/DRESSINGS) ×3 IMPLANT
BIOPATCH RED 1IN DISK 7.0MM (GAUZE/BANDAGES/DRESSINGS) ×1
BLADE CLIPPER SURG (BLADE) IMPLANT
CANISTER SUCT 3000ML PPV (MISCELLANEOUS) ×4 IMPLANT
CHLORAPREP W/TINT 26ML (MISCELLANEOUS) ×4 IMPLANT
COVER SURGICAL LIGHT HANDLE (MISCELLANEOUS) ×4 IMPLANT
COVER WAND RF STERILE (DRAPES) ×4 IMPLANT
DRAIN CHANNEL 19F RND (DRAIN) ×12 IMPLANT
DRAPE LAPAROSCOPIC ABDOMINAL (DRAPES) ×4 IMPLANT
DRAPE WARM FLUID 44X44 (DRAPE) ×4 IMPLANT
DRSG OPSITE POSTOP 4X10 (GAUZE/BANDAGES/DRESSINGS) IMPLANT
DRSG OPSITE POSTOP 4X8 (GAUZE/BANDAGES/DRESSINGS) IMPLANT
DRSG TEGADERM 2-3/8X2-3/4 SM (GAUZE/BANDAGES/DRESSINGS) ×4 IMPLANT
DRSG VAC ATS LRG SENSATRAC (GAUZE/BANDAGES/DRESSINGS) ×8 IMPLANT
ELECT BLADE 6.5 EXT (BLADE) IMPLANT
ELECT CAUTERY BLADE 6.4 (BLADE) ×8 IMPLANT
ELECT REM PT RETURN 9FT ADLT (ELECTROSURGICAL) ×4
ELECTRODE REM PT RTRN 9FT ADLT (ELECTROSURGICAL) ×2 IMPLANT
EVACUATOR SILICONE 100CC (DRAIN) ×12 IMPLANT
GAUZE SPONGE 2X2 8PLY STRL LF (GAUZE/BANDAGES/DRESSINGS) ×2 IMPLANT
GAUZE SPONGE 4X4 12PLY STRL LF (GAUZE/BANDAGES/DRESSINGS) ×4 IMPLANT
GLOVE BIO SURGEON STRL SZ7 (GLOVE) ×4 IMPLANT
GLOVE BIOGEL PI IND STRL 7.5 (GLOVE) ×2 IMPLANT
GLOVE BIOGEL PI INDICATOR 7.5 (GLOVE) ×2
GOWN STRL REUS W/ TWL LRG LVL3 (GOWN DISPOSABLE) ×4 IMPLANT
GOWN STRL REUS W/TWL LRG LVL3 (GOWN DISPOSABLE) ×4
KIT BASIN OR (CUSTOM PROCEDURE TRAY) ×4 IMPLANT
KIT COLOSTOMY ILEOSTOMY 4 (WOUND CARE) ×4 IMPLANT
KIT TURNOVER KIT B (KITS) ×4 IMPLANT
LIGASURE IMPACT 36 18CM CVD LR (INSTRUMENTS) IMPLANT
NS IRRIG 1000ML POUR BTL (IV SOLUTION) ×8 IMPLANT
PACK GENERAL/GYN (CUSTOM PROCEDURE TRAY) ×4 IMPLANT
PAD ARMBOARD 7.5X6 YLW CONV (MISCELLANEOUS) ×4 IMPLANT
RELOAD PROXIMATE 75MM BLUE (ENDOMECHANICALS) ×8 IMPLANT
SPECIMEN JAR LARGE (MISCELLANEOUS) IMPLANT
SPONGE GAUZE 2X2 STER 10/PKG (GAUZE/BANDAGES/DRESSINGS) ×2
SPONGE LAP 18X18 X RAY DECT (DISPOSABLE) IMPLANT
STAPLER GUN LINEAR PROX 60 (STAPLE) ×4 IMPLANT
STAPLER PROXIMATE 75MM BLUE (STAPLE) ×4 IMPLANT
STAPLER VISISTAT 35W (STAPLE) ×4 IMPLANT
SUCTION POOLE TIP (SUCTIONS) ×4 IMPLANT
SUT ETHILON 2 0 FS 18 (SUTURE) ×12 IMPLANT
SUT PDS AB 1 TP1 96 (SUTURE) ×16 IMPLANT
SUT SILK 2 0 SH CR/8 (SUTURE) ×4 IMPLANT
SUT SILK 2 0 TIES 10X30 (SUTURE) ×4 IMPLANT
SUT SILK 3 0 SH CR/8 (SUTURE) ×4 IMPLANT
SUT SILK 3 0 TIES 10X30 (SUTURE) ×4 IMPLANT
SUT VIC AB 3-0 SH 18 (SUTURE) IMPLANT
SUT VIC AB 3-0 SH 8-18 (SUTURE) ×4 IMPLANT
TAPE CLOTH SURG 6X10 WHT LF (GAUZE/BANDAGES/DRESSINGS) ×4 IMPLANT
TOWEL OR 17X26 10 PK STRL BLUE (TOWEL DISPOSABLE) ×4 IMPLANT
TRAY FOLEY MTR SLVR 16FR STAT (SET/KITS/TRAYS/PACK) IMPLANT
YANKAUER SUCT BULB TIP NO VENT (SUCTIONS) IMPLANT

## 2018-04-30 NOTE — Progress Notes (Signed)
PHARMACY - ADULT TOTAL PARENTERAL NUTRITION CONSULT NOTE   Pharmacy Consult for TPN Indication: perforated diverticulitis  Patient Measurements: Height: 5' 5.5" (166.4 cm) Weight: 260 lb 12.9 oz (118.3 kg) IBW/kg (Calculated) : 58.15 TPN AdjBW (KG): 75.1 Body mass index is 42.74 kg/m.  Assessment: Admitted 10/26 with acute sigmoid diverticulitis without perf or abscess.   PMH: HTN, HLD, CKD3, RA, DM, CAD, CVA, CRI, fibromyalgia,morbid obesity, chronic pain, RUE DVT  GI: Acute perforated sigmoid diverticulitis with multiple abscesses. Patient has continued to have inadequate oral intake since admission. Soft diet with poor intake. Complains of abdominal pain and suppressed appetite w/ some nausea. Pre-albumin 6.6>7.3. LBM 11/16. Drain output 49ml. Per RD, pt is high risk for refeeding. - po PPI  11/5: at least 3 fluid collectionsin the lower abdomen/pelvis with previous perforated diverticulitis; 11/12 CT: resolution of large pelvic abscess, minimal residual mesenteric abscess although this fluid appears to communicate with a second collection in the mesentery in the LEFT mid abdomen adjacent to a small bowel loop, and persistent large extraluminal gas collection in the RIGHT pelvis-place additional 3rd drain.  Endo: A1C 5.9. (glipizide PTA)  DM, CBGs 141-155 Insulin requirements in the past 24 hours: 9 units  Lytes: K up to 3.8. Cl & CO2 normalized, Mg 1.9. Ca adjusts to 9.6.  Renal:CKD3. Scr 1.38 (BL ~ 1.07). BUN 33>46. (UOP 1.55ml/kg/hr on lasix 40 mg daily) Pulm: [place] Cards: VSS on IV Lasix, metoprolol, Pravastatin Hepatobil: LFTs normalized  Neuro: RA, fibromyalgia, h/o CVA. Scheduled tylenol, Lexapro, Lyrica EB:RAXENM from perforated sigmoid diverticulitis on Merrem, Flagyl and fluconazole, WBC 8.9, afebrile  Abscess from 11/13 growing few candida tropicalis  Anticoag: RUE PICC DVT on LMWH  TPN Access: removed 11/6 for DVT,  new 04/23/18 TPN start date:04/23/18 Nutritional  Goals (per RD recommendation on 04/29/18): Kcal:  1800-2000 Protein:  90-1050 grams Fluid:  1.8-2.0 L  Goal TPN rate is 80 ml/hr   Current Nutrition: Clear liquids, new TPN 11/13.   Plan:  - Continue TPN at 31mL/hr. - TPN (at goal 80 ml/hr) provides 96 g of protein, 268 g of dextrose, and 57 g of lipids which provides 1807 kCals per day, meeting 100% of patient needs  Electrolytes in TPN: Max Cl, no other changes  Add MVI, trace elements SSI and adjust as needed.  Monitor TPN labs Mon/Thurs and prn Exlap planned for today   Albertina Parr, PharmD., BCPS Clinical Pharmacist Clinical phone for 04/30/18 until 3:30pm: [phone] If after 3:30pm, please refer to [facility] for unit-specific pharmacist

## 2018-04-30 NOTE — Progress Notes (Signed)
PROGRESS NOTE    [name]  UEA:[id number] DOB: May 08, 1946 DOA: 04/05/2018 PCP: [name], MD  Outpatient Specialists: None   Brief Narrative:  72 year old female with past medical history of hypertension, type 2 diabetes mellitus chronic kidney disease rheumatoid arthritis coronary artery disease S/P CABG on Plavix who was admitted with abdominal pain and found to have sigmoid diverticulitis on April 05, 2018 with perforation and abscess.  Initially was felt to be improving on IV Merrem but she continued to have abdominal pain. CT scan of the abdomen and pelvis was obtained April 15, 2018 and was significant for worsening diverticulitis with abdominal fluid collections.  Repeat CT done 04/27/2018 showing remaining fluid collection/abscesses despite the drains. Thickening remains in the sigmoid colon.  She went for surgical repair 04/30/2018.  SUBJECTIVE: Reports some mild abdominal pain, asking if she can have some ice chips Assessment & Plan:   Principal Problem:   Diverticulitis of large intestine with perforation and abscess Active Problems:   DM (diabetes mellitus) ([place])   HTN (hypertension)   Rheumatoid arthritis ([place])   Anemia   Renal insufficiency   Sepsis due to undetermined organism ([place])   CKD (chronic kidney disease) stage 3, GFR 30-59 ml/min ([facility])   Fibromyalgia   Acute renal failure superimposed on stage 3 chronic kidney disease ([place])    1.  Perforated sigmoid diverticulitis/abdominal abscess: CT on 04/27/2018 showing that the abscess/fluid collections are still present despite the drains.  Seen by surgeon.  She went for surgery today, status post ileostomy, wound VAC, she remains on TPN  2.  Sepsis:Patient improving on IV meropenem. 3.  Acute right upper extremity DVT extremity due to PICC line this was just discontinued April 16, 2018 still swollen but improving there is no warmth or redness.  She was initially on heparin now she is on Lovenox 4.   Acute kidney injury with chronic kidney disease stage III this has resolved she is back to baseline with creatinine of 1.2, creatinine is 1.41 today. On IV hydration. 5.  Fibromyalgia continue Lyrica 6.  Hypertension improved.  Continue current medications.  Continue to monitor blood pressure closely and adjust medications as needed. 7.  Type 2 diabetes mellitus well controlled.  Continue Accu-Cheks and and sliding scale insulin. 8.  Debility.  PT has been consulted they recommend SNF.  Patient stated to not be compliant with physical therapy 9.  Acute on chronic anemia she received 2 units of packed RBC.  Hemoglobin 7.7.  Continue to monitor and transfuse for hemoglobin less than 7. 10.  Depression patient recently started on Lexapro 11.  Paroxysmal atrial tachycardia.  She has not had any more episodes of PAT since that of April 22, 2018 echocardiogram that was done during showed preserved ejection fraction her beta-blocker was increased 12.  Hypokalemia resolved with repleted potassium   DVT prophylaxis: Subcutaneous Lovenox due to risk of DVT Code Status: Full Family Communication: Husband at bedside  Disposition Plan: Physical therapy recommended SNF Consultants:   General surgery   interventional radiology  Procedures: Echocardiogram Drains  percutaneous  x2 drainage of the abdominal area Right arm PICC line Left arm PICC line   Antimicrobials:  Meropenem  Floconazole    Objective: Vitals:   04/30/18 1350 04/30/18 1405 04/30/18 1420 04/30/18 1456  BP: (!) 142/77 (!) 153/84 138/65 (!) 182/99  Pulse: 80 80 81 83  Resp: 11 11 11 18   Temp:   (!) 97.4 F (36.3 C) 97.9 F (36.6 C)  TempSrc:    Oral  SpO2: 98% 97% 97% 98%  Weight:      Height:        Intake/Output Summary (Last 24 hours) at 04/30/2018 1759 Last data filed at 04/30/2018 1433 Gross per 24 hour  Intake 3092.97 ml  Output 2084 ml  Net 1008.97 ml   Filed Weights   04/19/18 0500 04/23/18 0500  04/24/18 0500  Weight: 123 kg 118.2 kg 118.3 kg    Examination:  Awake Alert, Oriented X 3, frail, chronically ill-appearing no new F.N deficits, Normal affect Symmetrical Chest wall movement, Good air movement bilaterally, CTAB RRR,No Gallops,Rubs or new Murmurs, No Parasternal Heave Abdominal exam deferred given its postoperatively, but she does not have left abdomen JP drain, right abdomen ileostomy, midline surgical wound/and wound VAC  No Cyanosis, Clubbing or edema, No new Rash or bruise       Data Reviewed: I have personally reviewed following labs and imaging studies  CBC: Recent Labs  Lab 04/24/18 0833  04/26/18 0405 04/27/18 0321 04/28/18 0305 04/29/18 0324 04/30/18 0417  WBC 8.8   < > 8.0 8.9 10.2 9.8 8.9  NEUTROABS 6.4  --   --   --  7.7  --   --   HGB 8.5*   < > 8.1* 8.0* 7.7* 7.7* 7.5*  HCT 26.4*   < > 25.5* 26.5* 24.9* 24.4* 24.4*  MCV 94.0   < > 94.4 95.0 93.6 93.1 94.2  PLT 352   < > 351 354 348 339 316   < > = values in this interval not displayed.   Basic Metabolic Panel: Recent Labs  Lab 04/24/18 0833 04/25/18 0619 04/26/18 0405 04/27/18 0321 04/28/18 0305 04/29/18 0324 04/30/18 0417  NA 139 138 136 138 137 136 139  K 3.4* 3.4* 3.6 3.6 3.3* 3.6 3.8  CL 92* 93* 91* 92* 95* 94* 98  CO2 37* 37* 38* 36* 34* 33* 32  GLUCOSE 142* 129* 124* 131* 142* 130* 140*  BUN 20 26* 29* 37* 44* 49* 46*  CREATININE 1.07* 1.18* 1.26* 1.49* 1.42* 1.41* 1.38*  CALCIUM 7.6* 7.7* 7.9* 8.3* 8.2* 8.4* 8.4*  MG 0.9* 1.5*  --  1.6* 1.6* 1.9  --   PHOS 3.7  --   --  3.5 3.9  --   --    GFR: Estimated Creatinine Clearance: 47.1 mL/min (A) (by C-G formula based on SCr of 1.38 mg/dL (H)). Liver Function Tests: Recent Labs  Lab 04/24/18 0833 04/28/18 0305  AST 55* 30  ALT 28 17  ALKPHOS 59 54  BILITOT 0.6 0.4  PROT 4.7* 4.5*  ALBUMIN 1.7* 1.7*   No results for input(s): LIPASE, AMYLASE in the last 168 hours. No results for input(s): AMMONIA in the last 168  hours. Coagulation Profile: No results for input(s): INR, PROTIME in the last 168 hours. Cardiac Enzymes: No results for input(s): CKTOTAL, CKMB, CKMBINDEX, TROPONINI in the last 168 hours. BNP (last 3 results) No results for input(s): PROBNP in the last 8760 hours. HbA1C: No results for input(s): HGBA1C in the last 72 hours. CBG: Recent Labs  Lab 04/30/18 0601 04/30/18 0811 04/30/18 1312 04/30/18 1424 04/30/18 1705  GLUCAP 147* 150* 223* 254* 325*   Lipid Profile: Recent Labs    04/28/18 0305  TRIG 113   Thyroid Function Tests: No results for input(s): TSH, T4TOTAL, FREET4, T3FREE, THYROIDAB in the last 72 hours. Anemia Panel: No results for input(s): VITAMINB12, FOLATE, FERRITIN, TIBC, IRON, RETICCTPCT in the last 72  hours. Urine analysis:    Component Value Date/Time   COLORURINE YELLOW 04/05/2018 1924   APPEARANCEUR CLEAR 04/05/2018 1924   LABSPEC 1.021 04/05/2018 1924   PHURINE 5.0 04/05/2018 1924   GLUCOSEU NEGATIVE 04/05/2018 1924   HGBUR NEGATIVE 04/05/2018 1924   BILIRUBINUR NEGATIVE 04/05/2018 [place] NEGATIVE 04/05/2018 1924   PROTEINUR NEGATIVE 04/05/2018 1924   UROBILINOGEN 0.2 06/20/2013 1832   NITRITE NEGATIVE 04/05/2018 1924   LEUKOCYTESUR NEGATIVE 04/05/2018 1924   Sepsis Labs: @LABRCNTIP (procalcitonin:4,lacticidven:4)  ) Recent Results (from the past 240 hour(s))  Aerobic/Anaerobic Culture (surgical/deep wound)     Status: None   Collection Time: 04/23/18  3:20 PM  Result Value Ref Range Status   Specimen Description ABSCESS RIGHT LOWER QUADRANT  Final   Special Requests Normal  Final   Gram Stain NO WBC SEEN NO ORGANISMS SEEN   Final   Culture   Final    FEW CANDIDA TROPICALIS NO ANAEROBES ISOLATED Performed at [facility], [place] [address], [place], [place] [address]    Report Status 04/28/2018 FINAL  Final  C difficile quick scan w PCR reflex     Status: Abnormal   Collection Time: 04/28/18  9:01 PM  Result Value Ref  Range Status   C Diff antigen POSITIVE (A) NEGATIVE Final   C Diff toxin NEGATIVE NEGATIVE Final   C Diff interpretation Results are indeterminate. See PCR results.  Final    Comment: Performed at [facility], [place] [address], [place], [place] [address]  C. Diff by PCR, Reflexed     Status: None   Collection Time: 04/28/18  9:01 PM  Result Value Ref Range Status   Toxigenic C. Difficile by PCR NEGATIVE NEGATIVE Final    Comment: Patient is colonized with non toxigenic C. difficile. May not need treatment unless significant symptoms are present. Performed at [facility], [place] [address], [place], [place] [address]   Surgical pcr screen     Status: None   Collection Time: 04/29/18  6:05 PM  Result Value Ref Range Status   MRSA, PCR NEGATIVE NEGATIVE Final   Staphylococcus aureus NEGATIVE NEGATIVE Final    Comment: (NOTE) The Xpert SA Assay (FDA approved for NASAL specimens in patients 41 years of age and older), is one component of a comprehensive surveillance program. It is not intended to diagnose infection nor to guide or monitor treatment. Performed at [facility], [place] [address] [place], [place] [address]          Radiology Studies: No results found.      Scheduled Meds: . sodium chloride   Intravenous Once  . Chlorhexidine Gluconate Cloth  6 each Topical Daily  . fentaNYL      . fentaNYL      . furosemide  40 mg Intravenous Daily  . HYDROmorphone      . HYDROmorphone      . HYDROmorphone      . insulin aspart      . insulin aspart  0-15 Units Subcutaneous Q6H  . latanoprost  1 drop Both Eyes QHS  . metoprolol tartrate  5 mg Intravenous Q6H  . nystatin   Topical TID  . pregabalin  75 mg Oral QHS  . sodium chloride flush  5 mL Intracatheter Q8H  . timolol  1 drop Both Eyes BID   Continuous Infusions: . acetaminophen    . famotidine (PEPCID) IV    . fluconazole (DIFLUCAN) IV 0 mL/hr  at 04/29/18 1341  . fluconazole (DIFLUCAN) IV    .  meropenem (MERREM) IV Stopped (04/30/18 0309)  . methocarbamol (ROBAXIN) IV    . TPN ADULT (ION) 80 mL/hr at 04/30/18 1713     LOS: 23 days      Phillips Climes, MD [facility] Pager [phone]  If 7PM-7AM, please contact night-coverage [email or web address] Password [email or web address] 04/30/2018, 5:59 PM

## 2018-04-30 NOTE — Op Note (Signed)
Preop diagnosis: Perforated sigmoid diverticulitis with multiple intra-abdominal abscesses Postop diagnosis: Same Procedure performed: Exploratory laparotomy, extensive lysis of adhesions, small bowel resection, loop ileostomy, drainage of multiple intra-abdominal abscesses, placement of wound VAC Surgeon:Ellysia Char [employer] Assistant Dr. Nedra Hai Anesthesia: General endotracheal Indications: This is a 72 year old female with multiple medical issues who has been hospitalized for about a month with sigmoid diverticulitis.  She has had multiple abscesses.  She has undergone multiple percutaneous drainage procedures.  These have been successful in draining some of the fluid collections but she keeps accumulating new fluid collections.  Her overall status is minimally improved.  Because of the ongoing nature of her disease we recommended exploratory laparotomy.  Description of procedure: The patient is brought to the operating room and placed in the supine position on the operating room table.  After an adequate level of general anesthesia was obtained, a Foley catheter was placed under sterile technique.  The patient's perineum was prepped with Betadine and her abdomen was prepped with ChloraPrep.  We draped in sterile fashion.  A timeout was taken to ensure the proper patient and proper procedure.  The patient has a drain entering her right lower quadrant.  She also has a drain in her right buttock.  She has another drain anteriorly near the midline.  The anterior drain was cut flush with the skin prior to prepping.  We made a vertical midline incision.  The patient is morbidly obese and has a lot of subcutaneous adipose tissue.  We dissected down to the fascia which we opened widely.  We entered the peritoneal cavity and placed the Bookwalter retractor.  The patient has adhesions to the left of the midline incision.  She also has dense adhesions inferiorly.  The small bowel adhesions are incredibly dense.   Even with Metzenbaum scissors they are very difficult to take down from the intra-abdominal wall.  We use a combination of careful sharp dissection as well as finger fracture to take down these inflammatory adhesions.  Once we had exposed the entire peritoneal cavity we began examining the right side of the abdomen.  The cecum and ascending colon appear to be normal.  There is no inflammation or fluid collections up around the liver duodenum.  The anterior surface of the stomach appears normal.  The left upper quadrant has significant small bowel and omental adhesions to the intra-abdominal wall.  The extend down the left side of the abdomen down to the pelvis.  These adhesions are incredibly dense.  There are multiple intraloop adhesions as well.  We began dissecting down in the pelvis on the right side.  Small bowel tracks into the abscess in the pelvis.  We were able to bluntly dissect in this area.  There is purulent fluid within the abscess cavity.  The drain from the patient's right lower quadrant is seen within this abscess cavity.  We evacuated the entire abscess cavity.  We continued dissecting the small bowel away from the wall of the abscess.  The small bowel was very densely adherent to the sigmoid colon in the left lower quadrant.  There is a large abscess in the pelvis that is full of purulent fluid.  We evacuated this as well.  There is no gross stool spilling from the colon.  We continued dissecting up the left side.  The small bowel is densely adherent to the descending colon.  We mobilized this away carefully with very difficult dissection.  There is one loop of small bowel that  we examined and decided needed to have resection.  There is a full-thickness enterotomy from our difficult dissection.  We resected approximately 4 inches of small bowel with a GIA-75 stapler.  The mesentery was ligated with the LigaSure.  Anastomosis was created with a GIA-75 and a TA 60 stapler.  The mesenteric defect was  closed with 2-0 silk.  The anastomosis was widely patent.  At this point we made the decision not to pursue further small bowel dissection.  We decided to divert the patient proximal to all of the inflamed colon and terminal ileum.  We removed two of her previous drains and placed Jackson-Pratt drains in the left lower quadrant and left upper quadrant.  The left upper quadrant drain tracks down through the abscesses in the left side of the abdomen.  The left lower quadrant drain goes into the large left pelvic abscess.  We left the right pelvic abscess drain in place from prior to surgery.  We selected a point on her right side that had previously been marked by the ostomy nurse.  The patient has very thick adipose tissue.  We made a partial circular incision.  We left the skin flap in place.  We excised the subcutaneous tissue down to the fascia.  The fascia was opened.  We passed the selected loop of small bowel through the incision.  We created a small opening in the mesentery underneath our loop.  The skin flap was passed underneath the loop ileostomy as a bridge.  This was secured to the opposite side with 3-0 Vicryl suture.  We irrigated the abdomen thoroughly and inspected for hemostasis.  The drains were all secured with 2-0 nylon sutures.  The fascia was reapproximated with double-stranded #1 PDS suture.  The subcutaneous tissues were irrigated and wound VAC was placed in the subcutaneous tissues.  This was secured with an occlusive drape and placed to suction.  I then matured the ileostomy by opening the loop of small bowel and maturing with 3-0 Vicryl sutures.  An ostomy appliance was placed over the ostomy.  We took down her drapes.  We rolled the patient on the left side and remove the right transgluteal drain.  A dressing was placed over this area.  The patient was then extubated and brought to the recovery room in stable condition.  All sponge, instrument, and needle counts are correct.  Imogene Burn. Georgette Dover, MD, [facility] Beeper [phone]  04/30/2018 1:09 PM

## 2018-04-30 NOTE — Progress Notes (Signed)
ANTICOAGULATION CONSULT NOTE - Follow Up Consult  Pharmacy Consult for Lovenox Indication: DVT  Allergies  Allergen Reactions  . Lactose Intolerance (Gi) Other (See Comments)    G.I. Upset  . Butrans [Buprenorphine] Rash and Other (See Comments)    Infected skin underneath application  . Penicillins Rash    Facial rash Has patient had a PCN reaction causing immediate rash, facial/tongue/throat swelling, SOB or lightheadedness with hypotension: Yes Has patient had a PCN reaction causing severe rash involving mucus membranes or skin necrosis: No Has patient had a PCN reaction that required hospitalization No Has patient had a PCN reaction occurring within the last 10 years: Yes If all of the above answers are "NO", then may proceed with Cephalosporin use.   . Simvastatin Rash     Labs: Recent Labs    04/28/18 0305 04/29/18 0324 04/30/18 0417  HGB 7.7* 7.7* 7.5*  HCT 24.9* 24.4* 24.4*  PLT 348 339 316  CREATININE 1.42* 1.41* 1.38*    Estimated Creatinine Clearance: 47.1 mL/min (A) (by C-G formula based on SCr of 1.38 mg/dL (H)).  Assessment: 72 yo F with acute RUE DVT from PICC. IV heparin changed to enoxaparin on 11/14, then held after 4pm dose on 11/19 for surgery today.  Lovenox to resume POD #1 per surgery.  AET 1318.  Hgb 7.5, 2 units PRBCs given today in OR. Platelet count normal. . Goal of Therapy:  Appropritate Lovenox dose for renal function and indication Monitor platelets by anticoagulation protocol: Yes   Plan:   Lovenox 120 mg SQ q12hrs to resume on 11/21 at 10am.  Follow renal function, CBC.  Monitor for signs/symptoms of bleeding.   Arty Baumgartner, [place] Pager: [phone] 04/30/2018 6:10 PM

## 2018-04-30 NOTE — Anesthesia Procedure Notes (Signed)
Procedure Name: Intubation Date/Time: 04/30/2018 10:21 AM Performed by: Leonor Liv, CRNA Pre-anesthesia Checklist: Patient identified, Emergency Drugs available, Suction available and Patient being monitored Patient Re-evaluated:Patient Re-evaluated prior to induction Oxygen Delivery Method: Circle System Utilized Preoxygenation: Pre-oxygenation with 100% oxygen Induction Type: IV induction Ventilation: Mask ventilation without difficulty Laryngoscope Size: Mac and 3 Grade View: Grade I Tube type: Oral Tube size: 7.0 mm Number of attempts: 1 Airway Equipment and Method: Stylet and Oral airway Placement Confirmation: ETT inserted through vocal cords under direct vision,  positive ETCO2 and breath sounds checked- equal and bilateral Secured at: 20 cm Tube secured with: Tape Dental Injury: Teeth and Oropharynx as per pre-operative assessment

## 2018-04-30 NOTE — Progress Notes (Signed)
Patient arrived to room from PACU in NAD. Patient free from pain. Family at bedside.

## 2018-04-30 NOTE — Progress Notes (Signed)
Subjective/Chief Complaint: Patient feels a little better today Still with lower abdominal pain Afebrile   Objective: Vital signs in last 24 hours: Temp:  [98.4 F (36.9 C)-99.7 F (37.6 C)] 99.7 F (37.6 C) (11/20 0601) Pulse Rate:  [76-82] 79 (11/20 0601) Resp:  [12-20] 12 (11/20 0601) BP: (118-146)/(58-71) 118/58 (11/20 0601) SpO2:  [92 %-94 %] 92 % (11/20 0601) Last BM Date: 04/29/18  Intake/Output from previous day: 11/19 0701 - 11/20 0700 In: 3143.8 [P.O.:560; I.V.:1913.8; IV Piggyback:600] Out: 3716 [Urine:3125; Drains:35; Stool:2] Intake/Output this shift: No intake/output data recorded.  Gen: Alert, NAD HEENT: EOM's intact, pupils equal and round Pulm: effort normal RCV:ELFYB, soft, +BS, no HSM, TTP LLQ/no peritonitis,drain x3withtracepurulent drainagein bags Psych: A&Ox3 Skin: no rashes noted, warm and dry  Lab Results:  Recent Labs    04/29/18 0324 04/30/18 0417  WBC 9.8 8.9  HGB 7.7* 7.5*  HCT 24.4* 24.4*  PLT 339 316   BMET Recent Labs    04/29/18 0324 04/30/18 0417  NA 136 139  K 3.6 3.8  CL 94* 98  CO2 33* 32  GLUCOSE 130* 140*  BUN 49* 46*  CREATININE 1.41* 1.38*  CALCIUM 8.4* 8.4*   PT/INR No results for input(s): LABPROT, INR in the last 72 hours. ABG No results for input(s): PHART, HCO3 in the last 72 hours.  Invalid input(s): PCO2, PO2  Studies/Results: No results found.  Anti-infectives: Anti-infectives (From admission, onward)   Start     Dose/Rate Route Frequency Ordered Stop   04/29/18 1100  fluconazole (DIFLUCAN) IVPB 400 mg     400 mg 100 mL/hr over 120 Minutes Intravenous Every 24 hours 04/29/18 1006     04/27/18 1500  meropenem (MERREM) 1 g in sodium chloride 0.9 % 100 mL IVPB     1 g 200 mL/hr over 30 Minutes Intravenous Every 12 hours 04/27/18 1008     04/26/18 1300  metroNIDAZOLE (FLAGYL) IVPB 500 mg  Status:  Discontinued     500 mg 100 mL/hr over 60 Minutes Intravenous Every 6 hours 04/26/18  1257 04/27/18 1041   04/15/18 1700  meropenem (MERREM) 1 g in sodium chloride 0.9 % 100 mL IVPB  Status:  Discontinued     1 g 200 mL/hr over 30 Minutes Intravenous Every 8 hours 04/15/18 1534 04/27/18 1008   04/09/18 2200  meropenem (MERREM) 1 g in sodium chloride 0.9 % 100 mL IVPB  Status:  Discontinued     1 g 200 mL/hr over 30 Minutes Intravenous Every 8 hours 04/09/18 1244 04/14/18 0944   04/06/18 1300  meropenem (MERREM) 1 g in sodium chloride 0.9 % 100 mL IVPB  Status:  Discontinued     1 g 200 mL/hr over 30 Minutes Intravenous Every 12 hours 04/06/18 1145 04/09/18 1244   04/06/18 1145  meropenem (MERREM) 1 g in sodium chloride 0.9 % 100 mL IVPB  Status:  Discontinued     1 g 200 mL/hr over 30 Minutes Intravenous Every 8 hours 04/06/18 1139 04/06/18 1145   04/06/18 0900  ciprofloxacin (CIPRO) IVPB 400 mg  Status:  Discontinued     400 mg 200 mL/hr over 60 Minutes Intravenous Every 12 hours 04/06/18 0732 04/06/18 1139   04/06/18 0300  metroNIDAZOLE (FLAGYL) IVPB 500 mg  Status:  Discontinued     500 mg 100 mL/hr over 60 Minutes Intravenous Every 8 hours 04/05/18 2335 04/06/18 1139   04/05/18 1930  ciprofloxacin (CIPRO) IVPB 400 mg     400 mg  200 mL/hr over 60 Minutes Intravenous  Once 04/05/18 1925 04/05/18 2217   04/05/18 1930  metroNIDAZOLE (FLAGYL) IVPB 500 mg     500 mg 100 mL/hr over 60 Minutes Intravenous  Once 04/05/18 1925 04/05/18 2102      Assessment/Plan: HLD 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 - on plavix (last dose 10/27) H/o CVA  Morbidly obese Chronic pain RUE DVT -was onIV heparin, now treatment dose lovenox  Acute perforated sigmoid diverticulitis with multiple abscesses - CT scan 11/5 revealsat least 3 fluid collectionsin the lower abdomen/pelvis with previous perforated diverticulitis; fluid collections are7.3 x 5.8 cm in left lower quadrant, air-fluid collection measuring 8.2 x 5.9 cm in the right lower quadrant, and8.9 x 5.3 cm fluid collection  in the pelvis in the pre rectal space which appears to contain stool and fluid -s/pIR perc drains x 2 - 04/16/18, culture with multiple organisms present/none predominant - CT scan 11/12 showed resolution of large pelvic abscess, minimal residual mesenteric abscess although this fluid appears to communicate with a second collection in the mesentery in the LEFT mid abdomen adjacent to a small bowel loop measuring 4.4 x 3.5 cm, and persistent large extraluminal gas collection in the RIGHT pelvis 8.2 x 5.0 x 6.8 cm. -S/p3rdIR drain 11/13, culturegrowingFEW CANDIDA TROPICALIS, report pending - CT scan 11/17 showed 3 drainage catheters with no significant surrounding fluid, but there are2 remaining fluid collections/abscessesin the pelvis with the largest4.8 x 3.7 cm  ID -currently merrem 10/27>>, flagyl 11/16>>11/17 VTE -SCDs,lovenox FEN -IVF,soft diet, TPN. Prealbumin 7.3 (11/18)  Plan surgery today as previously discussed.  Patient has been marked by WOCN.    [name]. Georgette Dover, MD, [facility] Surgery  General/ Trauma Surgery Beeper [phone]  04/30/2018 8:04 AM    LOS: 23 days    [name] 04/30/2018

## 2018-04-30 NOTE — Transfer of Care (Signed)
Immediate Anesthesia Transfer of Care Note  Patient: [name]  Procedure(s) Performed: EXPLORATORY LAPAROTOMY  LYSIS OF ADHESIONS SMALL BOWEL RESECTION WITH ANASTOMSIS LOOP ILEOSTOMY PLACEMENT OF DRAINS AND WOUND VAC (N/A Abdomen)  Patient Location: PACU  Anesthesia Type:General  Level of Consciousness: sedated  Airway & Oxygen Therapy: Patient Spontanous Breathing and Patient connected to nasal cannula oxygen  Post-op Assessment: Report given to RN and Post -op Vital signs reviewed and stable  Post vital signs: Reviewed and stable  Last Vitals:  Vitals Value Taken Time  BP 153/90 04/30/2018  1:05 PM  Temp 36.2 C 04/30/2018  1:05 PM  Pulse 80 04/30/2018  1:17 PM  Resp 11 04/30/2018  1:17 PM  SpO2 99 % 04/30/2018  1:17 PM  Vitals shown include unvalidated device data.  Last Pain:  Vitals:   04/30/18 0730  TempSrc:   PainSc: Asleep      Patients Stated Pain Goal: 3 (24/23/53 6144)  Complications: No apparent anesthesia complications

## 2018-04-30 NOTE — Progress Notes (Addendum)
Patient off floor to OR

## 2018-04-30 NOTE — Progress Notes (Signed)
Report called to short stay. 

## 2018-04-30 NOTE — Anesthesia Preprocedure Evaluation (Signed)
Anesthesia Evaluation  Patient identified by MRN, date of birth, ID band Patient awake    Reviewed: Allergy & Precautions, NPO status , Patient's Chart, lab work & pertinent test results, reviewed documented beta blocker date and time   Airway Mallampati: II  TM Distance: >3 FB     Dental  (+) Dental Advisory Given   Pulmonary sleep apnea , former smoker,    breath sounds clear to auscultation       Cardiovascular hypertension, Pt. on medications and Pt. on home beta blockers + CABG and + Peripheral Vascular Disease  + dysrhythmias  Rhythm:Regular Rate:Normal  Normal EF, valves okay   Neuro/Psych TIACVA    GI/Hepatic Neg liver ROS, GERD  ,Colon CA   Endo/Other  diabetes, Type 2, Insulin DependentMorbid obesity  Renal/GU Renal disease     Musculoskeletal  (+) Arthritis , Fibromyalgia -  Abdominal   Peds  Hematology  (+) anemia ,   Anesthesia Other Findings   Reproductive/Obstetrics                             Lab Results  Component Value Date   WBC 8.9 04/30/2018   HGB 7.5 (L) 04/30/2018   HCT 24.4 (L) 04/30/2018   MCV 94.2 04/30/2018   PLT 316 04/30/2018   Lab Results  Component Value Date   CREATININE 1.38 (H) 04/30/2018   BUN 46 (H) 04/30/2018   NA 139 04/30/2018   K 3.8 04/30/2018   CL 98 04/30/2018   CO2 32 04/30/2018    Anesthesia Physical Anesthesia Plan  ASA: IV  Anesthesia Plan: General   Post-op Pain Management:    Induction: Intravenous  PONV Risk Score and Plan: 4 or greater and Dexamethasone, Ondansetron and Treatment may vary due to age or medical condition  Airway Management Planned: Oral ETT  Additional Equipment: CVP  Intra-op Plan:   Post-operative Plan: Extubation in OR and Possible Post-op intubation/ventilation  Informed Consent: I have reviewed the patients History and Physical, chart, labs and discussed the procedure including the risks,  benefits and alternatives for the proposed anesthesia with the patient or authorized representative who has indicated his/her understanding and acceptance.   Dental advisory given  Plan Discussed with: CRNA  Anesthesia Plan Comments:         Anesthesia Quick Evaluation

## 2018-04-30 NOTE — Care Management Note (Addendum)
Case Management Note  Patient Details  Name: [name] MRN: [id number] Date of Birth: [birth year]  Subjective/Objective:    Tx from  AP 11/5 to [facility] 2/2 a follow up CT scan revealing worsening diverticulitis  with multiple abdominal fluid collections. Hx of hypertension, hyperlipidemia, dm2, pvd, CVA, OSA, Rheumatoid arthritis. From home with husband    11/20  S/p Exploratory laparotomy, extensive lysis of adhesions, small bowel resection, loop ileostomy, drainage of multiple intra-abdominal abscesses, placement of wound VAC  Sylina Henion (Spouse)     [phone]      PCP: Wende Neighbors  Action/Plan: Transition to SNF when medically stable...CSW managing disposition to SNF. NCM will continue to monitor for TOC needs.  Expected Discharge Date:                  Expected Discharge Plan:  Skilled Nursing Facility  In-House Referral:  Clinical Social Work  Discharge planning Services  CM Consult  Post Acute Care Choice:    Choice offered to:     DME Arranged:    DME Agency:     HH Arranged:    [place] Agency:     Status of Service:  In process, will continue to follow  If discussed at Long Length of Stay Meetings, dates discussed:    Additional Comments:  Sharin Mons, RN 04/30/2018, 4:12 PM

## 2018-05-01 ENCOUNTER — Encounter (HOSPITAL_COMMUNITY): Payer: Self-pay | Admitting: Surgery

## 2018-05-01 LAB — BPAM RBC
BLOOD PRODUCT EXPIRATION DATE: 201912192359
Blood Product Expiration Date: 201912192359
ISSUE DATE / TIME: 201911201034
ISSUE DATE / TIME: 201911201034
UNIT TYPE AND RH: 5100
Unit Type and Rh: 5100

## 2018-05-01 LAB — GLUCOSE, CAPILLARY
GLUCOSE-CAPILLARY: 202 mg/dL — AB (ref 70–99)
Glucose-Capillary: 197 mg/dL — ABNORMAL HIGH (ref 70–99)
Glucose-Capillary: 199 mg/dL — ABNORMAL HIGH (ref 70–99)
Glucose-Capillary: 241 mg/dL — ABNORMAL HIGH (ref 70–99)
Glucose-Capillary: 249 mg/dL — ABNORMAL HIGH (ref 70–99)

## 2018-05-01 LAB — COMPREHENSIVE METABOLIC PANEL
ALT: 21 U/L (ref 0–44)
AST: 36 U/L (ref 15–41)
Albumin: 1.8 g/dL — ABNORMAL LOW (ref 3.5–5.0)
Alkaline Phosphatase: 70 U/L (ref 38–126)
Anion gap: 7 (ref 5–15)
BUN: 47 mg/dL — ABNORMAL HIGH (ref 8–23)
CHLORIDE: 102 mmol/L (ref 98–111)
CO2: 29 mmol/L (ref 22–32)
CREATININE: 1.16 mg/dL — AB (ref 0.44–1.00)
Calcium: 8.3 mg/dL — ABNORMAL LOW (ref 8.9–10.3)
GFR calc non Af Amer: 46 mL/min — ABNORMAL LOW (ref >60)
GFR, EST AFRICAN AMERICAN: 53 mL/min — AB (ref >60)
Glucose, Bld: 257 mg/dL — ABNORMAL HIGH (ref 70–99)
POTASSIUM: 5.6 mmol/L — AB (ref 3.5–5.1)
SODIUM: 138 mmol/L (ref 135–145)
Total Bilirubin: 0.5 mg/dL (ref 0.3–1.2)
Total Protein: 5.1 g/dL — ABNORMAL LOW (ref 6.5–8.1)

## 2018-05-01 LAB — TYPE AND SCREEN
ABO/RH(D): O POS
ANTIBODY SCREEN: NEGATIVE
UNIT DIVISION: 0
UNIT DIVISION: 0

## 2018-05-01 LAB — BASIC METABOLIC PANEL
ANION GAP: 7 (ref 5–15)
BUN: 47 mg/dL — ABNORMAL HIGH (ref 8–23)
CHLORIDE: 102 mmol/L (ref 98–111)
CO2: 29 mmol/L (ref 22–32)
Calcium: 8.2 mg/dL — ABNORMAL LOW (ref 8.9–10.3)
Creatinine, Ser: 1.02 mg/dL — ABNORMAL HIGH (ref 0.44–1.00)
GFR calc non Af Amer: 53 mL/min — ABNORMAL LOW (ref >60)
GLUCOSE: 245 mg/dL — AB (ref 70–99)
Potassium: 5.4 mmol/L — ABNORMAL HIGH (ref 3.5–5.1)
Sodium: 138 mmol/L (ref 135–145)

## 2018-05-01 LAB — CBC
HEMATOCRIT: 34 % — AB (ref 36.0–46.0)
HEMATOCRIT: 32.9 % — AB (ref 36.0–46.0)
HEMOGLOBIN: 10.7 g/dL — AB (ref 12.0–15.0)
HEMOGLOBIN: 10.1 g/dL — AB (ref 12.0–15.0)
MCH: 29.2 pg (ref 26.0–34.0)
MCH: 28.9 pg (ref 26.0–34.0)
MCHC: 31.5 g/dL (ref 30.0–36.0)
MCHC: 30.7 g/dL (ref 30.0–36.0)
MCV: 92.9 fL (ref 80.0–100.0)
MCV: 94 fL (ref 80.0–100.0)
NRBC: 0.3 % — AB (ref 0.0–0.2)
Platelets: 294 10*3/uL (ref 150–400)
Platelets: 311 10*3/uL (ref 150–400)
RBC: 3.66 MIL/uL — ABNORMAL LOW (ref 3.87–5.11)
RBC: 3.5 MIL/uL — AB (ref 3.87–5.11)
RDW: 17.7 % — ABNORMAL HIGH (ref 11.5–15.5)
RDW: 17.8 % — ABNORMAL HIGH (ref 11.5–15.5)
WBC: 21 10*3/uL — ABNORMAL HIGH (ref 4.0–10.5)
WBC: 20.8 10*3/uL — ABNORMAL HIGH (ref 4.0–10.5)
nRBC: 0.3 % — ABNORMAL HIGH (ref 0.0–0.2)

## 2018-05-01 LAB — MAGNESIUM: Magnesium: 1.6 mg/dL — ABNORMAL LOW (ref 1.7–2.4)

## 2018-05-01 LAB — PHOSPHORUS: Phosphorus: 3.9 mg/dL (ref 2.5–4.6)

## 2018-05-01 MED ORDER — TRAVASOL 10 % IV SOLN
INTRAVENOUS | Status: DC
  Filled 2018-05-01: qty 960

## 2018-05-01 MED ORDER — INSULIN ASPART 100 UNIT/ML ~~LOC~~ SOLN
0.0000 [IU] | SUBCUTANEOUS | Status: DC
  Administered 2018-05-01: 3 [IU] via SUBCUTANEOUS
  Administered 2018-05-02: 1 [IU] via SUBCUTANEOUS
  Administered 2018-05-02 (×3): 3 [IU] via SUBCUTANEOUS
  Administered 2018-05-02: 1 [IU] via SUBCUTANEOUS
  Administered 2018-05-03: 3 [IU] via SUBCUTANEOUS
  Administered 2018-05-03: 2 [IU] via SUBCUTANEOUS
  Administered 2018-05-03: 3 [IU] via SUBCUTANEOUS
  Administered 2018-05-03: 2 [IU] via SUBCUTANEOUS
  Administered 2018-05-03: 3 [IU] via SUBCUTANEOUS
  Administered 2018-05-03: 2 [IU] via SUBCUTANEOUS
  Administered 2018-05-04 (×2): 3 [IU] via SUBCUTANEOUS
  Administered 2018-05-04: 2 [IU] via SUBCUTANEOUS
  Administered 2018-05-04: 8 [IU] via SUBCUTANEOUS
  Administered 2018-05-04: 5 [IU] via SUBCUTANEOUS
  Administered 2018-05-05 (×3): 3 [IU] via SUBCUTANEOUS
  Administered 2018-05-05: 5 [IU] via SUBCUTANEOUS
  Administered 2018-05-05 (×2): 3 [IU] via SUBCUTANEOUS

## 2018-05-01 MED ORDER — SODIUM ZIRCONIUM CYCLOSILICATE 10 G PO PACK
10.0000 g | PACK | Freq: Once | ORAL | Status: AC
  Administered 2018-05-01: 10 g via ORAL
  Filled 2018-05-01: qty 1

## 2018-05-01 MED ORDER — TRAVASOL 10 % IV SOLN
INTRAVENOUS | Status: AC
  Administered 2018-05-01: 18:00:00 via INTRAVENOUS
  Filled 2018-05-01: qty 960

## 2018-05-01 MED ORDER — FUROSEMIDE 10 MG/ML IJ SOLN
40.0000 mg | Freq: Once | INTRAMUSCULAR | Status: AC
  Administered 2018-05-01: 40 mg via INTRAVENOUS
  Filled 2018-05-01: qty 4

## 2018-05-01 MED ORDER — SODIUM CHLORIDE 0.9 % IV SOLN
INTRAVENOUS | Status: DC | PRN
  Administered 2018-05-01: 100 mL via INTRAVENOUS
  Administered 2018-05-11: 500 mL via INTRAVENOUS

## 2018-05-01 MED ORDER — MAGNESIUM SULFATE 2 GM/50ML IV SOLN
2.0000 g | Freq: Once | INTRAVENOUS | Status: AC
  Administered 2018-05-01: 2 g via INTRAVENOUS
  Filled 2018-05-01: qty 50

## 2018-05-01 MED ORDER — SODIUM CHLORIDE 0.9 % IV SOLN
1.0000 g | Freq: Three times a day (TID) | INTRAVENOUS | Status: DC
  Administered 2018-05-01 – 2018-05-05 (×12): 1 g via INTRAVENOUS
  Filled 2018-05-01 (×13): qty 1

## 2018-05-01 MED ORDER — PHENOL 1.4 % MT LIQD: 1.0000 | OROMUCOSAL | Status: DC | PRN

## 2018-05-01 MED ORDER — ORAL CARE MOUTH RINSE
15.0000 mL | Freq: Two times a day (BID) | OROMUCOSAL | Status: DC
  Administered 2018-05-03 – 2018-05-11 (×14): 15 mL via OROMUCOSAL

## 2018-05-01 NOTE — Progress Notes (Signed)
Physical Therapy Treatment Patient Details Name: [name] MRN: [id number] DOB: 01/10/1946 Today's Date: 05/01/2018    History of Present Illness [name]  is a 72 y.o. female, w hypertension, hyperlipidemia, dm2, pvd, CVA, OSA, Rheumatoid arthritis apparently c/o LLQ pain over the past several weeks, just started on abx yesterday, pt notes subjective fever as well as nausea.  Pt denies emesis, constipation, diarrhea, brbpr, black stool.  Pt presented due to worsening abdominal pain today. Found to have acute perforated sigmoid diverticulitis with multiple abscesses. Pt is s/p abdominal abscess drains X2.     PT Comments    Patient's tolerance to treatment today was fair.  Patient was in bed with husband present upon PT arrival.  Patient c/o increased pain during today's session.  Patient required mod A +2 with performing sit to stand from bed into stedy with therapist assisting patient with bed pad and requiring VC to reach for stedy bar.  Patient was able to stand in stedy during transfer to chair.  Patient would continue to benefit from acute care PT in order to progress towards all stated goals and to improve functional outcomes.  Patient continues to be a good candidate for SNF placement based on current functional status.    Follow Up Recommendations  SNF     Equipment Recommendations  None recommended by PT    Recommendations for Other Services       Precautions / Restrictions Precautions Precautions: Fall;Other (comment) Precaution Comments: 2 abscess drains Restrictions Weight Bearing Restrictions: No    Mobility  Bed Mobility Overal bed mobility: Needs Assistance Bed Mobility: Rolling;Sidelying to Sit Rolling: Mod assist;+2 for physical assistance Sidelying to sit: Max assist;+2 for physical assistance       General bed mobility comments: Pt required VC to reach for bed railing and to bend knees prior to rolling.  Patient was able to move B LE over edge  of bed.  Patient required max A +2 for elevation and support of trunk from sidelying to sitting.  Patient required mod A for trunk support in sitting.  Transfers Overall transfer level: Needs assistance   Transfers: Sit to/from Stand Sit to Stand: Mod assist;+2 physical assistance         General transfer comment: Pt required VC to reach for front stedy bar.  Patient was able to boost into standing utilizing stedy bar and with therapist providing mod A through use of bed pad.  Patient required min VC to reach for chair armrests prior to sitting to chair.  Ambulation/Gait                 Stairs             Wheelchair Mobility    Modified Rankin (Stroke Patients Only)       Balance Overall balance assessment: Needs assistance Sitting-balance support: Feet supported;Bilateral upper extremity supported Sitting balance-Leahy Scale: Fair Sitting balance - Comments: Patient required external assist from therapist to remain sitting upright on EOB.   Standing balance support: Bilateral upper extremity supported Standing balance-Leahy Scale: Poor Standing balance comment: pt stood in stedy approximately 30 seconds during transfer from bed to recliner chair.                            Cognition Arousal/Alertness: Awake/alert Behavior During Therapy: WFL for tasks assessed/performed Overall Cognitive Status: Within Functional Limits for tasks assessed  Exercises      General Comments        Pertinent Vitals/Pain Pain Assessment: 0-10 Pain Score: 9  Pain Location: chest, waist, and ribs Pain Descriptors / Indicators: Moaning;Discomfort;Grimacing;Constant Pain Intervention(s): Limited activity within patient's tolerance;Monitored during session    Home Living                      Prior Function            PT Goals (current goals can now be found in the care plan section) Acute  Rehab PT Goals Patient Stated Goal: none stated PT Goal Formulation: With patient/family Time For Goal Achievement: 04/28/18 Potential to Achieve Goals: Fair Progress towards PT goals: Progressing toward goals    Frequency    Min 3X/week      PT Plan Current plan remains appropriate    Co-evaluation              AM-PAC PT "6 Clicks" Daily Activity  Outcome Measure  Difficulty turning over in bed (including adjusting bedclothes, sheets and blankets)?: Unable Difficulty moving from lying on back to sitting on the side of the bed? : Unable Difficulty sitting down on and standing up from a chair with arms (e.g., wheelchair, bedside commode, etc,.)?: Unable Help needed moving to and from a bed to chair (including a wheelchair)?: A Lot Help needed walking in hospital room?: Total Help needed climbing 3-5 steps with a railing? : Total 6 Click Score: 7    End of Session   Activity Tolerance: Patient limited by pain Patient left: in chair;with call bell/phone within reach;with family/visitor present Nurse Communication: Mobility status PT Visit Diagnosis: Unsteadiness on feet (R26.81);Muscle weakness (generalized) (M62.81)     Time: 0569-7948 PT Time Calculation (min) (ACUTE ONLY): 30 min  Charges:  $Therapeutic Activity: 23-37 mins                     [address] SPTA    [name] 05/01/2018, 5:15 PM

## 2018-05-01 NOTE — Progress Notes (Addendum)
PHARMACY - ADULT TOTAL PARENTERAL NUTRITION CONSULT NOTE   Pharmacy Consult for TPN Indication: perforated diverticulitis  Patient Measurements: Height: 5' 5.5" (166.4 cm) Weight: 260 lb 12.9 oz (118.3 kg) IBW/kg (Calculated) : 58.15 TPN AdjBW (KG): 75.1 Body mass index is 42.74 kg/m.  Assessment: Admitted 10/26 with acute sigmoid diverticulitis without perf or abscess.   PMH: HTN, HLD, CKD3, RA, DM, CAD, CVA, CRI, fibromyalgia,morbid obesity, chronic pain, RUE DVT  GI: Acute perforated sigmoid diverticulitis with multiple abscesses. Patient has continued to have inadequate oral intake since admission. Soft diet with poor intake. Complains of abdominal pain and suppressed appetite w/ some nausea. Pre-albumin 6.6>7.3. LBM 11/16. Returned to OR on 11/20 for abscess drainage and wound vac placement, Drain output 451ml. - po PPI  11/5: at least 3 fluid collectionsin the lower abdomen/pelvis with previous perforated diverticulitis; 11/12 CT: resolution of large pelvic abscess, minimal residual mesenteric abscess although this fluid appears to communicate with a second collection in the mesentery in the LEFT mid abdomen adjacent to a small bowel loop, and persistent large extraluminal gas collection in the RIGHT pelvis-place additional 3rd drain.  Endo: A1C 5.9. (glipizide PTA)  DM, CBGs up 241-325 (likely reactionary to surgery)  Insulin requirements in the past 24 hours: 26 units  Lytes: K up to 5.6. Cl & CO2 normalized, Mg 1.6. Ca adjusts to 9.6.  Renal:CKD3. Scr 1.38>1.16 (BL ~ 1.07). BUN 47. UOP 0.28ml/kg/hr on lasix 40 mg daily Pulm: [place] Cards: VSS on IV Lasix, metoprolol, Pravastatin Hepatobil: LFTs normalized  Neuro: RA, fibromyalgia, h/o CVA. Scheduled tylenol, Lexapro, Lyrica KP:[id number] from perforated sigmoid diverticulitis on Merrem, Flagyl and fluconazole, WBC 8.9>21, afebrile  Abscess from 11/13 growing few candida tropicalis  Anticoag: RUE PICC DVT on LMWH  TPN Access:  removed 11/6 for DVT,  new 04/23/18 TPN start date:04/23/18 Nutritional Goals (per RD recommendation on 04/29/18): Kcal:  1800-2000 Protein:  90-1050 grams Fluid:  1.8-2.0 L  Goal TPN rate is 80 ml/hr   Current Nutrition: NPO, new TPN 11/13.   Plan:  - Continue TPN at 81mL/hr. - TPN (at goal 80 ml/hr) provides 96 g of protein, 268 g of dextrose, and 57 g of lipids which provides 1807 kCals per day, meeting 100% of patient needs Mg sulfate 2 gm IV once  Electrolytes in TPN: Max Cl, No potassium in bag per discussion per MD  Add MVI, trace elements, famotidine to TPN  SSI and adjust as needed.  Monitor TPN labs Mon/Thurs and prn  Albertina Parr, PharmD., BCPS Clinical Pharmacist Clinical phone for 05/01/18 until 3:30pm: [phone] If after 3:30pm, please refer to [facility] for unit-specific pharmacist

## 2018-05-01 NOTE — Progress Notes (Signed)
[facility] Progress Note  1 Day Post-Op  Subjective: CC-  Husband at bedside, patient states that she feels a little better than she did prior to surgery. Abdominal pain currently well controlled. Denies n/v. No flatus or stool from ostomy.  Objective: Vital signs in last 24 hours: Temp:  [97.2 F (36.2 C)-98 F (36.7 C)] 98 F (36.7 C) (11/21 0459) Pulse Rate:  [77-86] 80 (11/21 0459) Resp:  [11-18] 18 (11/21 0459) BP: (134-182)/(64-99) 134/66 (11/21 0459) SpO2:  [87 %-98 %] 97 % (11/21 0459) Last BM Date: 04/30/18(illeostomy)  Intake/Output from previous day: 11/20 0701 - 11/21 0700 In: 3192 [I.V.:1799.8; Blood:630; IV Piggyback:452.2] Out: 0867 [Urine:1100; Drains:427; Blood:50] Intake/Output this shift: No intake/output data recorded.  PE: Gen: Alert, NAD HEENT: EOM's intact, pupils equal and round Pulm: effort normal YPP:JKDTO, soft, few BS heard, vac to midline incision, JP drain x2 with SS output, IR drain x1 with trace thin/purulent drainagein bag Psych: A&Ox3 Skin: no rashes noted, warm and dry  Lab Results:  Recent Labs    05/01/18 0510 05/01/18 0852  WBC 21.0* 20.8*  HGB 10.7* 10.1*  HCT 34.0* 32.9*  PLT 294 311   BMET Recent Labs    04/30/18 0417 05/01/18 0510  NA 139 138  K 3.8 5.6*  CL 98 102  CO2 32 29  GLUCOSE 140* 257*  BUN 46* 47*  CREATININE 1.38* 1.16*  CALCIUM 8.4* 8.3*   PT/INR No results for input(s): LABPROT, INR in the last 72 hours. CMP     Component Value Date/Time   NA 138 05/01/2018 0510   K 5.6 (H) 05/01/2018 0510   CL 102 05/01/2018 0510   CO2 29 05/01/2018 0510   GLUCOSE 257 (H) 05/01/2018 0510   BUN 47 (H) 05/01/2018 0510   CREATININE 1.16 (H) 05/01/2018 0510   CALCIUM 8.3 (L) 05/01/2018 0510   PROT 5.1 (L) 05/01/2018 0510   ALBUMIN 1.8 (L) 05/01/2018 0510   AST 36 05/01/2018 0510   ALT 21 05/01/2018 0510   ALKPHOS 70 05/01/2018 0510   BILITOT 0.5 05/01/2018 0510   GFRNONAA 46 (L)  05/01/2018 0510   GFRAA 53 (L) 05/01/2018 0510   Lipase  No results found for: LIPASE     Studies/Results: No results found.  Anti-infectives: Anti-infectives (From admission, onward)   Start     Dose/Rate Route Frequency Ordered Stop   04/30/18 1215  fluconazole (DIFLUCAN) IVPB 400 mg     400 mg 100 mL/hr over 120 Minutes Intravenous To Surgery 04/30/18 1200 05/01/18 1215   04/29/18 1100  fluconazole (DIFLUCAN) IVPB 400 mg     400 mg 100 mL/hr over 120 Minutes Intravenous Every 24 hours 04/29/18 1006     04/27/18 1500  meropenem (MERREM) 1 g in sodium chloride 0.9 % 100 mL IVPB     1 g 200 mL/hr over 30 Minutes Intravenous Every 12 hours 04/27/18 1008     04/26/18 1300  metroNIDAZOLE (FLAGYL) IVPB 500 mg  Status:  Discontinued     500 mg 100 mL/hr over 60 Minutes Intravenous Every 6 hours 04/26/18 1257 04/27/18 1041   04/15/18 1700  meropenem (MERREM) 1 g in sodium chloride 0.9 % 100 mL IVPB  Status:  Discontinued     1 g 200 mL/hr over 30 Minutes Intravenous Every 8 hours 04/15/18 1534 04/27/18 1008   04/09/18 2200  meropenem (MERREM) 1 g in sodium chloride 0.9 % 100 mL IVPB  Status:  Discontinued     1  g 200 mL/hr over 30 Minutes Intravenous Every 8 hours 04/09/18 1244 04/14/18 0944   04/06/18 1300  meropenem (MERREM) 1 g in sodium chloride 0.9 % 100 mL IVPB  Status:  Discontinued     1 g 200 mL/hr over 30 Minutes Intravenous Every 12 hours 04/06/18 1145 04/09/18 1244   04/06/18 1145  meropenem (MERREM) 1 g in sodium chloride 0.9 % 100 mL IVPB  Status:  Discontinued     1 g 200 mL/hr over 30 Minutes Intravenous Every 8 hours 04/06/18 1139 04/06/18 1145   04/06/18 0900  ciprofloxacin (CIPRO) IVPB 400 mg  Status:  Discontinued     400 mg 200 mL/hr over 60 Minutes Intravenous Every 12 hours 04/06/18 0732 04/06/18 1139   04/06/18 0300  metroNIDAZOLE (FLAGYL) IVPB 500 mg  Status:  Discontinued     500 mg 100 mL/hr over 60 Minutes Intravenous Every 8 hours 04/05/18 2335  04/06/18 1139   04/05/18 1930  ciprofloxacin (CIPRO) IVPB 400 mg     400 mg 200 mL/hr over 60 Minutes Intravenous  Once 04/05/18 1925 04/05/18 2217   04/05/18 1930  metroNIDAZOLE (FLAGYL) IVPB 500 mg     500 mg 100 mL/hr over 60 Minutes Intravenous  Once 04/05/18 1925 04/05/18 2102       Assessment/Plan HLD 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 - on plavix (last dose 10/27) H/o CVA  Morbidly obese Chronic pain RUE DVT -was onIV heparin, now treatment dose lovenox  Acute perforated sigmoid diverticulitis with multiple abscesses - CT scan 11/5 revealsat least 3 fluid collectionsin the lower abdomen/pelvis with previous perforated diverticulitis; fluid collections are7.3 x 5.8 cm in left lower quadrant, air-fluid collection measuring 8.2 x 5.9 cm in the right lower quadrant, and8.9 x 5.3 cm fluid collection in the pelvis in the pre rectal space which appears to contain stool and fluid -s/pIR perc drains x 2 - 04/16/18, culture with multiple organisms present/none predominant - CT scan 11/12 showed resolution of large pelvic abscess, minimal residual mesenteric abscess although this fluid appears to communicate with a second collection in the mesentery in the LEFT mid abdomen adjacent to a small bowel loop measuring 4.4 x 3.5 cm, and persistent large extraluminal gas collection in the RIGHT pelvis 8.2 x 5.0 x 6.8 cm. -S/p3rdIR drain 11/13, culturegrowingFEW CANDIDA TROPICALIS, report pending - CT scan 11/17 showed 3 drainage catheters with no significant surrounding fluid, but there are2 remaining fluid collections/abscessesin the pelvis with the largest4.8 x 3.7 cm S/p Exploratory laparotomy, extensive lysis of adhesions, small bowel resection, loop ileostomy, drainage of multiple intra-abdominal abscesses, placement of wound VAC - POD 1 - path pending - midline vac MWF - single IR drain remains, and 2 JP drains placed intraoperatively - awaiting bowel function  ID  -currently merrem 10/27>>, flagyl 11/16>>11/17 VTE -SCDs,lovenox FEN -IVF,NPO, NGT to LIWS, TPN. Prealbumin 7.3 (11/18) Foley - d/c today  Plan- D/c foley. Encourage OOB today, will try to get patient a straight backed chair. Continue therapies. Continue NG tube until return in bowel function. WOC consult for new ostomy and wound vac. Labs in AM.   LOS: 24 days    Wellington Hampshire , [facility] 05/01/2018, 9:46 AM Pager: [phone] Mon 7:00 am -11:30 AM Tues-Fri 7:00 am-4:30 pm Sat-Sun 7:00 am-11:30 am

## 2018-05-01 NOTE — Progress Notes (Addendum)
PROGRESS NOTE    [name]  KGY:[id number] DOB: 11/20/1945 DOA: 04/05/2018 PCP: [name], MD    Brief Narrative:   72 year old female with past medical history of hypertension, type 2 diabetes mellitus chronic kidney disease rheumatoid arthritis coronary artery disease S/P CABG on Plavix who was admitted with abdominal pain and found to have sigmoid diverticulitis on April 05, 2018 with perforation and abscess.  Initially was felt to be improving on IV Merrem but she continued to have abdominal pain. CT scan of the abdomen and pelvis was obtained April 15, 2018 and was significant for worsening diverticulitis with abdominal fluid collections, she was transferred to [facility] for IR evaluation, he did receive total of 3 percutaneous drains, with repeat CT abdomen pelvis 04/27/2018, still showing remaining fluid collection/abscess despite he strains, and appropriate antibiotic coverage, patient went for exploratory laparotomy with lysis of adhesion, small bowel resection with loop ileostomy, and placement of wound VAC by Dr. Georgette Dover 04/30/2018.  SUBJECTIVE: Patient reports abdominal pain is controlled, overall she is feeling better today than yesterday  Assessment & Plan:   Principal Problem:   Diverticulitis of large intestine with perforation and abscess Active Problems:   DM (diabetes mellitus) ([place])   HTN (hypertension)   Rheumatoid arthritis ([place])   Anemia   Renal insufficiency   Sepsis due to undetermined organism ([place])   CKD (chronic kidney disease) stage 3, GFR 30-59 ml/min (H[facility])   Fibromyalgia   Acute renal failure superimposed on stage 3 chronic kidney disease (H[facility])   Perforated sigmoid diverticulitis/abdominal abscess: -No improvement with initial management with percutaneous drain/antibiotic coverage -Repeat CT on 04/27/2018 showing that the abscess/fluid collections are still present despite percutaneous drain x3, and appropriate antibiotic coverage . -   exploratory laparotomy with lysis of adhesion, small bowel resection with loop ileostomy, and placement of wound VAC by Dr. Georgette Dover 04/30/2018. -Abscess culture and Candida tropicalis 04/23/2018, and seems 04/16/2018 when she is on Diflucan and meropenem. -Management per general surgery, remains n.p.o., on TPN and NGT suction. -Leukocytosis today, most likely reactive after surgery  Hyperkalemia -discussed With pharmacy, they will adjust with TPN, he is on Lasix, I will give an extra dose of Lasix, is unable to give anything oral to lower her potassium giving her n.p.o. status postoperatively, as well enema is not possible given her colon is postoperatively and she is status post ileostomy. -We will give 1 dose of lokelma  Acute right upper extremity DVT extremity -  due to PICC line this was just discontinued April 16, 2018 still swollen but improving there is no warmth or redness.  She was initially on heparin now she is on Lovenox  Acute kidney injury with chronic kidney disease stage III  - this has resolved she is back to baseline with creatinine of 1.2, creatinine is 1.41 today. On IV hydration.  Fibromyalgia  - continue Lyrica able to take oral  Hypertension improved.  -She is n.p.o., continue with scheduled IV metoprolol and PRN hydralazine  Type 2 diabetes mellitus controlled -CBG are elevated over last 24 hours, most likely related to surgery, if remains elevated either will start on long acting insulin or add insulin to TPN. well controlled.  Continue Accu-Cheks and and sliding scale insulin.  Debility. -   PT has been consulted they recommend SNF.  Patient stated to not be compliant with physical therapy  Acute on chronic anemia  - she received 2 units of packed RBC.  Hemoglobin 7.7.  Continue to monitor  and transfuse for hemoglobin less than 7.  Depression patient recently started on Lexapro  Paroxysmal atrial tachycardia.  She has not had any more episodes of PAT since  that of April 22, 2018 echocardiogram that was done during showed preserved ejection fraction her beta-blocker was increased  Hypokalemia  - resolved with repleted potassium   DVT prophylaxis: Subcutaneous Lovenox due to risk of DVT Code Status: Full Family Communication: none at bedside  Disposition Plan: Physical therapy recommended SNF Consultants:   General surgery   interventional radiology  Procedures: Echocardiogram Drains  percutaneous  x2 drainage of the abdominal area Right arm PICC line Left arm PICC line exploratory laparotomy with lysis of adhesion, small bowel resection with loop ileostomy, and placement of wound VAC by Dr. Georgette Dover 04/30/2018.   Antimicrobials:  Meropenem  Floconazole    Objective: Vitals:   04/30/18 1456 04/30/18 2059 05/01/18 0459 05/01/18 1115  BP: (!) 182/99 137/64 134/66 (!) 148/58  Pulse: 83 86 80 82  Resp: 18 14 18    Temp: 97.9 F (36.6 C) (!) 97.5 F (36.4 C) 98 F (36.7 C)   TempSrc: Oral Oral Oral   SpO2: 98% (!) 87% 97%   Weight:      Height:        Intake/Output Summary (Last 24 hours) at 05/01/2018 1434 Last data filed at 05/01/2018 0973 Gross per 24 hour  Intake 1262.01 ml  Output 930 ml  Net 332.01 ml   Filed Weights   04/19/18 0500 04/23/18 0500 04/24/18 0500  Weight: 123 kg 118.2 kg 118.3 kg    Examination:  Awake Alert, Oriented X 3, No new F.N deficits, Normal affect Symmetrical Chest wall movement, Good air movement bilaterally, CTAB RRR,No Gallops,Rubs or new Murmurs, No Parasternal Heave Diminished bowel sounds, with midline surgical wound VAC, right ileostomy with some dark liquid in the ileostomy bag, left JP drain with sanguinous material . No Cyanosis, Clubbing or edema, No new Rash or bruise     Data Reviewed: I have personally reviewed following labs and imaging studies  CBC: Recent Labs  Lab 04/28/18 0305 04/29/18 0324 04/30/18 0417 05/01/18 0510 05/01/18 0852  WBC 10.2 9.8 8.9  21.0* 20.8*  NEUTROABS 7.7  --   --   --   --   HGB 7.7* 7.7* 7.5* 10.7* 10.1*  HCT 24.9* 24.4* 24.4* 34.0* 32.9*  MCV 93.6 93.1 94.2 92.9 94.0  PLT 348 339 316 294 532   Basic Metabolic Panel: Recent Labs  Lab 04/25/18 0619  04/27/18 0321 04/28/18 0305 04/29/18 0324 04/30/18 0417 05/01/18 0510 05/01/18 0852  NA 138   < > 138 137 136 139 138 138  K 3.4*   < > 3.6 3.3* 3.6 3.8 5.6* 5.4*  CL 93*   < > 92* 95* 94* 98 102 102  CO2 37*   < > 36* 34* 33* 32 29 29  GLUCOSE 129*   < > 131* 142* 130* 140* 257* 245*  BUN 26*   < > 37* 44* 49* 46* 47* 47*  CREATININE 1.18*   < > 1.49* 1.42* 1.41* 1.38* 1.16* 1.02*  CALCIUM 7.7*   < > 8.3* 8.2* 8.4* 8.4* 8.3* 8.2*  MG 1.5*  --  1.6* 1.6* 1.9  --  1.6*  --   PHOS  --   --  3.5 3.9  --   --  3.9  --    < > = values in this interval not displayed.   GFR: Estimated Creatinine Clearance:  63.7 mL/min (A) (by C-G formula based on SCr of 1.02 mg/dL (H)). Liver Function Tests: Recent Labs  Lab 04/28/18 0305 05/01/18 0510  AST 30 36  ALT 17 21  ALKPHOS 54 70  BILITOT 0.4 0.5  PROT 4.5* 5.1*  ALBUMIN 1.7* 1.8*   No results for input(s): LIPASE, AMYLASE in the last 168 hours. No results for input(s): AMMONIA in the last 168 hours. Coagulation Profile: No results for input(s): INR, PROTIME in the last 168 hours. Cardiac Enzymes: No results for input(s): CKTOTAL, CKMB, CKMBINDEX, TROPONINI in the last 168 hours. BNP (last 3 results) No results for input(s): PROBNP in the last 8760 hours. HbA1C: No results for input(s): HGBA1C in the last 72 hours. CBG: Recent Labs  Lab 04/30/18 1424 04/30/18 1705 05/01/18 0049 05/01/18 0607 05/01/18 1238  GLUCAP 254* 325* 249* 241* 202*   Lipid Profile: No results for input(s): CHOL, HDL, LDLCALC, TRIG, CHOLHDL, LDLDIRECT in the last 72 hours. Thyroid Function Tests: No results for input(s): TSH, T4TOTAL, FREET4, T3FREE, THYROIDAB in the last 72 hours. Anemia Panel: No results for input(s):  VITAMINB12, FOLATE, FERRITIN, TIBC, IRON, RETICCTPCT in the last 72 hours. Urine analysis:    Component Value Date/Time   COLORURINE YELLOW 04/05/2018 1924   APPEARANCEUR CLEAR 04/05/2018 1924   LABSPEC 1.021 04/05/2018 1924   PHURINE 5.0 04/05/2018 1924   GLUCOSEU NEGATIVE 04/05/2018 1924   HGBUR NEGATIVE 04/05/2018 1924   BILIRUBINUR NEGATIVE 04/05/2018 [place] NEGATIVE 04/05/2018 1924   PROTEINUR NEGATIVE 04/05/2018 1924   UROBILINOGEN 0.2 06/20/2013 1832   NITRITE NEGATIVE 04/05/2018 1924   LEUKOCYTESUR NEGATIVE 04/05/2018 1924   Sepsis Labs: @LABRCNTIP (procalcitonin:4,lacticidven:4)  ) Recent Results (from the past 240 hour(s))  Aerobic/Anaerobic Culture (surgical/deep wound)     Status: None   Collection Time: 04/23/18  3:20 PM  Result Value Ref Range Status   Specimen Description ABSCESS RIGHT LOWER QUADRANT  Final   Special Requests Normal  Final   Gram Stain NO WBC SEEN NO ORGANISMS SEEN   Final   Culture   Final    FEW CANDIDA TROPICALIS NO ANAEROBES ISOLATED Performed at [facility], [place] [address] [place], [place] [address]    Report Status 04/28/2018 FINAL  Final  C difficile quick scan w PCR reflex     Status: Abnormal   Collection Time: 04/28/18  9:01 PM  Result Value Ref Range Status   C Diff antigen POSITIVE (A) NEGATIVE Final   C Diff toxin NEGATIVE NEGATIVE Final   C Diff interpretation Results are indeterminate. See PCR results.  Final    Comment: Performed at [facility], [place] [address] [place],  [address]  C. Diff by PCR, Reflexed     Status: None   Collection Time: 04/28/18  9:01 PM  Result Value Ref Range Status   Toxigenic C. Difficile by PCR NEGATIVE NEGATIVE Final    Comment: Patient is colonized with non toxigenic C. difficile. May not need treatment unless significant symptoms are present. Performed at [facility], [place] [address] [place],  [address]   Surgical pcr screen     Status: None    Collection Time: 04/29/18  6:05 PM  Result Value Ref Range Status   MRSA, PCR NEGATIVE NEGATIVE Final   Staphylococcus aureus NEGATIVE NEGATIVE Final    Comment: (NOTE) The Xpert SA Assay (FDA approved for NASAL specimens in patients 38 years of age and older), is one component of a comprehensive surveillance  program. It is not intended to diagnose infection nor to guide or monitor treatment. Performed at [facility], [place] [address] [place], [place] [address]          Radiology Studies: No results found.      Scheduled Meds: . sodium chloride   Intravenous Once  . Chlorhexidine Gluconate Cloth  6 each Topical Daily  . enoxaparin (LOVENOX) injection  120 mg Subcutaneous Q12H  . furosemide  40 mg Intravenous Daily  . insulin aspart  0-15 Units Subcutaneous Q4H  . latanoprost  1 drop Both Eyes QHS  . mouth rinse  15 mL Mouth Rinse BID  . metoprolol tartrate  5 mg Intravenous Q6H  . nystatin   Topical TID  . pregabalin  75 mg Oral QHS  . sodium chloride flush  5 mL Intracatheter Q8H  . timolol  1 drop Both Eyes BID   Continuous Infusions: . acetaminophen 1,000 mg (05/01/18 0625)  . fluconazole (DIFLUCAN) IV 400 mg (05/01/18 1416)  . meropenem (MERREM) IV 1 g (05/01/18 0306)  . methocarbamol (ROBAXIN) IV    . TPN ADULT (ION) 80 mL/hr at 04/30/18 1713  . TPN ADULT (ION)       LOS: 24 days      Phillips Climes, MD [facility] Pager [phone]  If 7PM-7AM, please contact night-coverage [email or web address] Password [facility] 05/01/2018, 2:34 PM

## 2018-05-01 NOTE — Progress Notes (Signed)
Referring Physician(s): Tsuei,M  Supervising Physician: Markus Daft  Patient Status:  [name]  Chief Complaint:  Abdominal pain  Subjective: Patient feeling a little better since surgery yesterday with lessened abd discomfort.  Denies nausea or vomiting.   Allergies: Lactose intolerance (gi); Butrans [buprenorphine]; Penicillins; and Simvastatin  Medications: Prior to Admission medications   Medication Sig Start Date End Date Taking? Authorizing Provider  Blood Glucose Monitoring Suppl (FREESTYLE LITE) DEVI See admin instructions. 12/22/14  Yes [provider]  ciprofloxacin (CIPRO) 500 MG/5ML (10%) suspension Take by mouth 2 (two) times daily.   Yes [provider]  clopidogrel (PLAVIX) 75 MG tablet Take 1 tablet (75 mg total) by mouth daily with breakfast. 01/14/18  Yes Rehman, Mechele Dawley, MD  Dexlansoprazole 30 MG capsule Take 40 mg by mouth daily.   Yes [provider]  diclofenac sodium (VOLTAREN) 1 % GEL Apply 2 g topically 4 (four) times daily as needed (Pain).    Yes [provider]  glipiZIDE (GLUCOTROL) 10 MG tablet Take 10 mg by mouth daily before breakfast.   Yes [provider]  HYDROcodone-acetaminophen (NORCO) 10-325 MG tablet Take 1 tablet by mouth 4 (four) times daily.   Yes [provider]  IRON PO Take 1 tablet by mouth daily.   Yes [provider]  isosorbide dinitrate (ISORDIL) 5 MG tablet Take 1 tablet (5 mg total) by mouth 3 (three) times daily before meals. 02/07/18  Yes Rehman, Mechele Dawley, MD  leflunomide (ARAVA) 20 MG tablet Take 20 mg by mouth daily.   Yes [provider]  metoprolol succinate (TOPROL-XL) 25 MG 24 hr tablet Take 25 mg by mouth daily.   Yes [provider]  metroNIDAZOLE (FLAGYL) 250 MG tablet Take 250 mg by mouth 3 (three) times daily.   Yes [provider]  Olmesartan-amLODIPine-HCTZ (TRIBENZOR) 40-5-25 MG TABS Take 1 tablet by mouth daily.    Yes  [provider]  pravastatin (PRAVACHOL) 40 MG tablet Take 40 mg by mouth at bedtime.   Yes [provider]  predniSONE (DELTASONE) 5 MG tablet Take 5 mg by mouth daily with breakfast.   Yes [provider]  pregabalin (LYRICA) 75 MG capsule Take 75 mg by mouth 3 (three) times daily.   Yes [provider]  Tafluprost (ZIOPTAN) 0.0015 % SOLN Place 1 drop into both eyes at bedtime.    Yes [provider]  temazepam (RESTORIL) 30 MG capsule Take 30 mg by mouth at bedtime.   Yes [provider]  timolol (BETIMOL) 0.5 % ophthalmic solution Place 1 drop into both eyes 2 (two) times daily.    Yes [provider]  Tofacitinib Citrate (XELJANZ) 5 MG TABS Take 1 tablet by mouth daily.   Yes [provider]  VITAMIN D, ERGOCALCIFEROL, PO Take 50,000 Units by mouth once a week. Thursdays   Yes [provider]  docusate sodium (COLACE) 100 MG capsule Take 2 capsules (200 mg total) by mouth at bedtime. Patient taking differently: Take 200 mg by mouth as needed.  02/13/18   Rogene Houston, MD     Vital Signs: BP 134/66 (BP Location: Left Arm)   Pulse 80   Temp 98 F (36.7 C) (Oral)   Resp 18   Ht 5' 5.5" (1.664 m)   Wt 260 lb 12.9 oz (118.3 kg)   SpO2 97%   BMI 42.74 kg/m   Physical Exam awake, alert.  Right lower quadrant drain in  place, insertion site okay, mildly tender to palpation.  Small amount of serosanguineous fluid in gravity bag  Imaging: Ct Abdomen Pelvis Wo Contrast  Result Date: 04/27/2018 CLINICAL DATA:  Re-evaluate diverticulitis with abscess drainage. EXAM: CT ABDOMEN AND PELVIS WITHOUT CONTRAST TECHNIQUE: Multidetector CT imaging of the abdomen and pelvis was performed following the standard protocol without IV contrast. COMPARISON:  CT scan April 23, 2018 FINDINGS: Lower chest: No acute abnormality. Hepatobiliary: No focal liver abnormality is seen. Status post cholecystectomy. No biliary  dilatation. Pancreas: Unremarkable. No pancreatic ductal dilatation or surrounding inflammatory changes. Spleen: Normal in size without focal abnormality. Adrenals/Urinary Tract: There is a tiny nonobstructive stone in the lower pole the right kidney. No hydronephrosis or perinephric stranding. No ureteral stones. The bladder is normal. The adrenal glands are normal. Stomach/Bowel: The stomach is normal. There is a duodenal diverticulum off the distal third portion of the duodenum. The remainder of the small bowel is unremarkable. Colonic diverticuli are noted. A segment of sigmoid colon remains thickened. There is mild increased attenuation in the adjacent fat and some low level remaining diverticulitis is not excluded. Remainder of the colon is normal. The appendix is best seen on coronal images 52 through 57. The appendix is located just superior to a right lower quadrant drain. There is increased attenuation in the fat of the abdomen but it does not appear to arise from the appendix. Vascular/Lymphatic: Atherosclerotic changes are seen in the abdominal aorta. No adenopathy. Reproductive: Status post hysterectomy. No adnexal masses. Other: There are 3 abscess drainage catheters. The first is seen in the pelvis on axial image 73 with no surrounding fluid. The second is seen in the upper central pelvis on axial image 59 with no surrounding drainable fluid collection. The third is seen in the right side of the pelvis on image 60. The previously identified fluid and air collection in this region has resolved. The mild fat stranding in this region is thought to be due to from the drainage catheter and recently drained abscess and not arising from the adjacent appendix. Recommend attention on follow-up. There is remaining fluid in the abdomen and pelvis. There is a small pocket of fluid measuring up to 2.8 cm on axial image 43. The distal duodenum courses through this fluid. There is another pocket of fluid in the left  lower quadrant of the abdomen seen on axial image 52 measuring 4.8 x 3.7 cm. There is a small extension office fluid collection medially which abuts the drainage catheter. However, this fluid collection is clearly not being drained. Musculoskeletal: No acute or significant osseous findings. IMPRESSION: 1. There are 3 drainage catheters in the lower abdomen and pelvis. No significant fluid around any of the drainage catheters. However, there are 2 remaining fluid collections/abscesses. The first is in the region of the distal third portion of the duodenum measuring up to 2.8 cm. Duodenum appears to run through or immediately adjacent to this fluid. Another pocket of fluid is seen in the left lower quadrant of the abdomen measuring 4.8 x 3.7 cm. 2. Nonobstructive stone in the lower pole the right kidney. 3. Thickening remains in the sigmoid colon. Low-grade remaining inflammation of the sigmoid colon from diverticulitis is not excluded. 4. There is fat stranding in the region of the appendix. However, there is a adjacent drainage catheter and I suspect the stranding is secondary to the recent abscess in this region. Recommend clinical correlation and attention on follow-up. Electronically Signed   By: Dorise Bullion III  M.D   On: 04/27/2018 22:51    Labs:  CBC: Recent Labs    04/29/18 0324 04/30/18 0417 05/01/18 0510 05/01/18 0852  WBC 9.8 8.9 21.0* 20.8*  HGB 7.7* 7.5* 10.7* 10.1*  HCT 24.4* 24.4* 34.0* 32.9*  PLT 339 316 294 311    COAGS: Recent Labs    04/06/18 1952 04/23/18 0329  INR 1.22 1.25  APTT 33  --     BMP: Recent Labs    04/29/18 0324 04/30/18 0417 05/01/18 0510 05/01/18 0852  NA 136 139 138 138  K 3.6 3.8 5.6* 5.4*  CL 94* 98 102 102  CO2 33* 32 29 29  GLUCOSE 130* 140* 257* 245*  BUN 49* 46* 47* 47*  CALCIUM 8.4* 8.4* 8.3* 8.2*  CREATININE 1.41* 1.38* 1.16* 1.02*  GFRNONAA 36* 37* 46* 53*  GFRAA 42* 43* 53* >60    LIVER FUNCTION TESTS: Recent Labs     04/15/18 0859 04/24/18 0833 04/28/18 0305 05/01/18 0510  BILITOT 1.0 0.6 0.4 0.5  AST 69* 55* 30 36  ALT 43 28 17 21   ALKPHOS 63 59 54 70  PROT 5.6* 4.7* 4.5* 5.1*  ALBUMIN 2.1* 1.7* 1.7* 1.8*    Assessment and Plan: Pt with history of diverticular abscesses, status post right lower quadrant drain placement on 04/23/2018 (additional drains placed on 04/16/2018 since removed); status post exploratory lap with lysis of adhesions/small bowel resection, loop ileostomy, drainage of multiple intra-abdominal abscesses and placement of wound VAC on 11/20; afebrile, WBC 21 (8.9), hemoglobin 10.7 (7.5), creatinine 1.16, potassium 5.6; cont with drain irrigation, output monitoring, lab checks. Further plans as per [facility]. Recommend f/u CT around 11/24.    Electronically Signed: D. Rowe Robert, PA-C 05/01/2018, 10:01 AM   I spent a total of  at the the patient's bedside AND on the patient's hospital floor or unit, greater than 50% of which was counseling/coordinating care for     Patient ID: [name], female   DOB: [birth year], 72 y.o.   MRN: [id number]

## 2018-05-01 NOTE — Progress Notes (Signed)
Pharmacy Antibiotic Note  [name] is a 72 y.o. female admitted on 04/05/2018 with Sepsis with pneumoperitoneum secondary to worsening perforated sigmoid diverticulitis with multiple abdominal fluid collections and multiple abscesses.  Pharmacy dosing Merrem dosing.  Sepsis with pneumoperitoneum 2/2 worsening perforated sigmoid diverticulitis with multiple abdominal fluid collections and multiple abscesses. -s/p  surgery 11/20-single IR drain remains, 2 JP drains placed - awaiting bowel fxn WBC 20.8, afeb, overall pt feeling better.  Leukocytosis 11/21 noted most likely reactive after surgery.  AKI with CKD stage III: AKI has resolved, SCr down 1.02,  will incr Merrem to q8h   Cipro 10/26 >> 10/27 Flagyl 10/26 >> 10/27 Merrem 10/27>>11/4,  11/5 >> Fluconazole 11/19>>   (canadida in RLQ abscess Cx)   11/13 RLQ abscess CX >> reincubated , few canadida tropicalis pending 11/6: Pelvic abscess >> ngtd , holding for possible anaerobe, peptostreptococcus, and clostridium/Final 11/6: Pelvic abscess (x2) >> multiple organisms>>none predominant, no anerobes isolated-Final 10/28 MRSA PCR >> neg 10/26 Bcx >> negative  Plan: Increase meropenem to  1g IV every 8 hours due to renal function improved.  F/u Cx updates and LOT, renal function  Height: 5' 5.5" (166.4 cm) Weight: 260 lb 12.9 oz (118.3 kg) IBW/kg (Calculated) : 58.15  Temp (24hrs), Avg:97.8 F (36.6 C), Min:97.5 F (36.4 C), Max:98 F (36.7 C)  Recent Labs  Lab 04/28/18 0305 04/29/18 0324 04/30/18 0417 05/01/18 0510 05/01/18 0852  WBC 10.2 9.8 8.9 21.0* 20.8*  CREATININE 1.42* 1.41* 1.38* 1.16* 1.02*    Estimated Creatinine Clearance: 63.7 mL/min (A) (by C-G formula based on SCr of 1.02 mg/dL (H)).    Allergies  Allergen Reactions  . Lactose Intolerance (Gi) Other (See Comments)    G.I. Upset  . Butrans [Buprenorphine] Rash and Other (See Comments)    Infected skin underneath application  . Penicillins Rash   Facial rash Has patient had a PCN reaction causing immediate rash, facial/tongue/throat swelling, SOB or lightheadedness with hypotension: Yes Has patient had a PCN reaction causing severe rash involving mucus membranes or skin necrosis: No Has patient had a PCN reaction that required hospitalization No Has patient had a PCN reaction occurring within the last 10 years: Yes If all of the above answers are "NO", then may proceed with Cephalosporin use.   . Simvastatin Rash    Nicole Cella, RPh Clinical Pharmacist Please check AMION for all [facility] numbers 05/01/2018 3:41 PM

## 2018-05-01 NOTE — Anesthesia Postprocedure Evaluation (Signed)
Anesthesia Post Note  Patient: [name]  Procedure(s) Performed: EXPLORATORY LAPAROTOMY  LYSIS OF ADHESIONS SMALL BOWEL RESECTION WITH ANASTOMSIS LOOP ILEOSTOMY PLACEMENT OF DRAINS AND WOUND VAC (N/A Abdomen)     Patient location during evaluation: PACU Anesthesia Type: General Level of consciousness: awake and alert Pain management: pain level controlled Vital Signs Assessment: post-procedure vital signs reviewed and stable Respiratory status: spontaneous breathing, nonlabored ventilation, respiratory function stable and patient connected to nasal cannula oxygen Cardiovascular status: blood pressure returned to baseline and stable Postop Assessment: no apparent nausea or vomiting Anesthetic complications: no    Last Vitals:  Vitals:   05/01/18 0459 05/01/18 1115  BP: 134/66 (!) 148/58  Pulse: 80 82  Resp: 18   Temp: 36.7 C   SpO2: 97%     Last Pain:  Vitals:   05/01/18 0459  TempSrc: Oral  PainSc:                  Tiajuana Amass

## 2018-05-01 NOTE — Consult Note (Signed)
[facility] Nurse ostomy consult note Stoma type/location: LUQ ileostomy, blood tinged liquid in pouch.    Stomal assessment/size: not assessed pouch intact with surrounding VAC.  To be changed M/W/F   Peristomal assessment: not assessed Treatment options for stomal/peristomal skin:  Output blood tinged liquid Ostomy pouching: 2pc.  Education provided: Spouse at bedside.  Discussed twice weekly pouch changes once VAC is discontinued and that we would change M/W/F at this time to coincide with VAC changes.  [facility] Nurse wound consult note Reason for Consult:Midline abdominal wound with NPWT (VAC) dressing in place.  Will change M/W/F with ostomy at same time.  Wound type:surgical Pressure Injury POA: NA WOC team will follow.  Domenic Moras MSN, RN, FNP-BC CWON Wound, Ostomy, Continence Nurse Pager [phone]

## 2018-05-02 LAB — MAGNESIUM: Magnesium: 1.9 mg/dL (ref 1.7–2.4)

## 2018-05-02 LAB — BASIC METABOLIC PANEL
ANION GAP: 8 (ref 5–15)
BUN: 52 mg/dL — AB (ref 8–23)
CO2: 32 mmol/L (ref 22–32)
Calcium: 8.5 mg/dL — ABNORMAL LOW (ref 8.9–10.3)
Chloride: 100 mmol/L (ref 98–111)
Creatinine, Ser: 1.06 mg/dL — ABNORMAL HIGH (ref 0.44–1.00)
GFR calc Af Amer: 59 mL/min — ABNORMAL LOW (ref >60)
GFR, EST NON AFRICAN AMERICAN: 51 mL/min — AB (ref >60)
Glucose, Bld: 177 mg/dL — ABNORMAL HIGH (ref 70–99)
POTASSIUM: 4.4 mmol/L (ref 3.5–5.1)
Sodium: 140 mmol/L (ref 135–145)

## 2018-05-02 LAB — GLUCOSE, CAPILLARY
GLUCOSE-CAPILLARY: 163 mg/dL — AB (ref 70–99)
GLUCOSE-CAPILLARY: 153 mg/dL — AB (ref 70–99)
GLUCOSE-CAPILLARY: 153 mg/dL — AB (ref 70–99)
GLUCOSE-CAPILLARY: 136 mg/dL — AB (ref 70–99)
GLUCOSE-CAPILLARY: 159 mg/dL — AB (ref 70–99)
Glucose-Capillary: 194 mg/dL — ABNORMAL HIGH (ref 70–99)

## 2018-05-02 LAB — CBC
HCT: 31.1 % — ABNORMAL LOW (ref 36.0–46.0)
Hemoglobin: 9.6 g/dL — ABNORMAL LOW (ref 12.0–15.0)
MCH: 29.2 pg (ref 26.0–34.0)
MCHC: 30.9 g/dL (ref 30.0–36.0)
MCV: 94.5 fL (ref 80.0–100.0)
PLATELETS: 314 10*3/uL (ref 150–400)
RBC: 3.29 MIL/uL — AB (ref 3.87–5.11)
RDW: 17.6 % — ABNORMAL HIGH (ref 11.5–15.5)
WBC: 22.2 10*3/uL — ABNORMAL HIGH (ref 4.0–10.5)
nRBC: 0.2 % (ref 0.0–0.2)

## 2018-05-02 MED ORDER — TRAVASOL 10 % IV SOLN
INTRAVENOUS | Status: AC
  Administered 2018-05-02: 18:00:00 via INTRAVENOUS
  Filled 2018-05-02: qty 960

## 2018-05-02 NOTE — Progress Notes (Signed)
    Midline wound healing well, continue wound vac changes MWF.  Wellington Hampshire, [facility] Surgery 05/02/2018, 10:09 AM Pager: [phone] Mon 7:00 am -11:30 AM Tues-Fri 7:00 am-4:30 pm Sat-Sun 7:00 am-11:30 am

## 2018-05-02 NOTE — Progress Notes (Signed)
Progress Note: General Surgery Service   Assessment/Plan: Principal Problem:   Diverticulitis of large intestine with perforation and abscess Active Problems:   DM (diabetes mellitus) (HCC)   HTN (hypertension)   Rheumatoid arthritis ([place])   Anemia   Renal insufficiency   Sepsis due to undetermined organism ([place])   CKD (chronic kidney disease) stage 3, GFR 30-59 ml/min (HCC)   Fibromyalgia   Acute renal failure superimposed on stage 3 chronic kidney disease (HCC)  s/p Procedure(s): EXPLORATORY LAPAROTOMY  LYSIS OF ADHESIONS SMALL BOWEL RESECTION WITH ANASTOMSIS LOOP ILEOSTOMY PLACEMENT OF DRAINS AND WOUND VAC 04/30/2018 Expected ileus after large lysis of adhesions in acute phase infection. Having ostomy output, persistent leukocytosis -would continue NG tube due to persistent bloating like symptoms -continue TPN -continue abx    LOS: 25 days  Chief Complaint/Subjective: Pain in chair, some pain at incision, feels bloated  Objective: Vital signs in last 24 hours: Temp:  [97.7 F (36.5 C)-98.6 F (37 C)] 98.6 F (37 C) (11/22 0418) Pulse Rate:  [78-87] 87 (11/22 0418) Resp:  [16-18] 18 (11/22 0418) BP: (131-154)/(58-72) 154/60 (11/22 0418) SpO2:  [98 %] 98 % (11/22 0418) Last BM Date: 05/01/18  Intake/Output from previous day: 11/21 0701 - 11/22 0700 In: 1340.7 [I.V.:755.7; NG/GT:150; IV Piggyback:400] Out: 4105 [Urine:3800; Drains:205; Stool:100] Intake/Output this shift: No intake/output data recorded.  Lungs: nonlabored breathing  Cardiovascular: RRR  Abd: soft, ATTP, incision with vac in place, right sided drains with serosanguinous output, ostomy with moderate amount, ostomy slightly dark but still some redness to the mucosa  Neuro: AOx4  Lab Results: CBC  Recent Labs    05/01/18 0852 05/02/18 0505  WBC 20.8* 22.2*  HGB 10.1* 9.6*  HCT 32.9* 31.1*  PLT 311 314   BMET Recent Labs    05/01/18 0852 05/02/18 0505  NA 138 140  K 5.4* 4.4  CL  102 100  CO2 29 32  GLUCOSE 245* 177*  BUN 47* 52*  CREATININE 1.02* 1.06*  CALCIUM 8.2* 8.5*   PT/INR No results for input(s): LABPROT, INR in the last 72 hours. ABG No results for input(s): PHART, HCO3 in the last 72 hours.  Invalid input(s): PCO2, PO2  Studies/Results:  Anti-infectives: Anti-infectives (From admission, onward)   Start     Dose/Rate Route Frequency Ordered Stop   05/01/18 1600  meropenem (MERREM) 1 g in sodium chloride 0.9 % 100 mL IVPB     1 g 200 mL/hr over 30 Minutes Intravenous Every 8 hours 05/01/18 1539     04/30/18 1215  fluconazole (DIFLUCAN) IVPB 400 mg  Status:  Discontinued     400 mg 100 mL/hr over 120 Minutes Intravenous To Surgery 04/30/18 1200 05/01/18 1215   04/29/18 1100  fluconazole (DIFLUCAN) IVPB 400 mg     400 mg 100 mL/hr over 120 Minutes Intravenous Every 24 hours 04/29/18 1006     04/27/18 1500  meropenem (MERREM) 1 g in sodium chloride 0.9 % 100 mL IVPB  Status:  Discontinued     1 g 200 mL/hr over 30 Minutes Intravenous Every 12 hours 04/27/18 1008 05/01/18 1539   04/26/18 1300  metroNIDAZOLE (FLAGYL) IVPB 500 mg  Status:  Discontinued     500 mg 100 mL/hr over 60 Minutes Intravenous Every 6 hours 04/26/18 1257 04/27/18 1041   04/15/18 1700  meropenem (MERREM) 1 g in sodium chloride 0.9 % 100 mL IVPB  Status:  Discontinued     1 g 200 mL/hr over 30 Minutes Intravenous  Every 8 hours 04/15/18 1534 04/27/18 1008   04/09/18 2200  meropenem (MERREM) 1 g in sodium chloride 0.9 % 100 mL IVPB  Status:  Discontinued     1 g 200 mL/hr over 30 Minutes Intravenous Every 8 hours 04/09/18 1244 04/14/18 0944   04/06/18 1300  meropenem (MERREM) 1 g in sodium chloride 0.9 % 100 mL IVPB  Status:  Discontinued     1 g 200 mL/hr over 30 Minutes Intravenous Every 12 hours 04/06/18 1145 04/09/18 1244   04/06/18 1145  meropenem (MERREM) 1 g in sodium chloride 0.9 % 100 mL IVPB  Status:  Discontinued     1 g 200 mL/hr over 30 Minutes Intravenous Every  8 hours 04/06/18 1139 04/06/18 1145   04/06/18 0900  ciprofloxacin (CIPRO) IVPB 400 mg  Status:  Discontinued     400 mg 200 mL/hr over 60 Minutes Intravenous Every 12 hours 04/06/18 0732 04/06/18 1139   04/06/18 0300  metroNIDAZOLE (FLAGYL) IVPB 500 mg  Status:  Discontinued     500 mg 100 mL/hr over 60 Minutes Intravenous Every 8 hours 04/05/18 2335 04/06/18 1139   04/05/18 1930  ciprofloxacin (CIPRO) IVPB 400 mg     400 mg 200 mL/hr over 60 Minutes Intravenous  Once 04/05/18 1925 04/05/18 2217   04/05/18 1930  metroNIDAZOLE (FLAGYL) IVPB 500 mg     500 mg 100 mL/hr over 60 Minutes Intravenous  Once 04/05/18 1925 04/05/18 2102      Medications: Scheduled Meds: . sodium chloride   Intravenous Once  . Chlorhexidine Gluconate Cloth  6 each Topical Daily  . enoxaparin (LOVENOX) injection  120 mg Subcutaneous Q12H  . furosemide  40 mg Intravenous Daily  . insulin aspart  0-15 Units Subcutaneous Q4H  . latanoprost  1 drop Both Eyes QHS  . mouth rinse  15 mL Mouth Rinse BID  . metoprolol tartrate  5 mg Intravenous Q6H  . nystatin   Topical TID  . pregabalin  75 mg Oral QHS  . sodium chloride flush  5 mL Intracatheter Q8H  . timolol  1 drop Both Eyes BID   Continuous Infusions: . sodium chloride Stopped (05/02/18 0618)  . fluconazole (DIFLUCAN) IV Stopped (05/01/18 1616)  . meropenem (MERREM) IV 1 g (05/02/18 0618)  . methocarbamol (ROBAXIN) IV    . TPN ADULT (ION) 80 mL/hr at 05/01/18 1803   PRN Meds:.sodium chloride, albuterol, hydrALAZINE, methocarbamol (ROBAXIN) IV, morphine injection, ondansetron (ZOFRAN) IV, phenol, sodium chloride flush  Mickeal Skinner, MD Pg# [phone] [facility]

## 2018-05-02 NOTE — Progress Notes (Signed)
PROGRESS NOTE    [name]  DPO:[id number] DOB: 1946-05-24 DOA: 04/05/2018 PCP: [name], MD    Brief Narrative:   72 year old female with past medical history of hypertension, type 2 diabetes mellitus chronic kidney disease rheumatoid arthritis coronary artery disease S/P CABG on Plavix who was admitted with abdominal pain and found to have sigmoid diverticulitis on April 05, 2018 with perforation and abscess.  Initially was felt to be improving on IV Merrem but she continued to have abdominal pain. CT scan of the abdomen and pelvis was obtained April 15, 2018 and was significant for worsening diverticulitis with abdominal fluid collections, she was transferred to [facility] for IR evaluation, he did receive total of 3 percutaneous drains, with repeat CT abdomen pelvis 04/27/2018, still showing remaining fluid collection/abscess despite he strains, and appropriate antibiotic coverage, patient went for exploratory laparotomy with lysis of adhesion, small bowel resection with loop ileostomy, and placement of wound VAC by Dr. Georgette Dover 04/30/2018.  SUBJECTIVE: Patient reports abdominal pain is controlled, she did have some delirium overnight.  Assessment & Plan:   Principal Problem:   Diverticulitis of large intestine with perforation and abscess Active Problems:   DM (diabetes mellitus) ([place])   HTN (hypertension)   Rheumatoid arthritis ([place])   Anemia   Renal insufficiency   Sepsis due to undetermined organism ([place])   CKD (chronic kidney disease) stage 3, GFR 30-59 ml/min (HCC)   Fibromyalgia   Acute renal failure superimposed on stage 3 chronic kidney disease (HCC)   Perforated sigmoid diverticulitis/abdominal abscess: -No improvement with initial management with percutaneous drain/antibiotic coverage -Repeat CT on 04/27/2018 showing that the abscess/fluid collections are still present despite percutaneous drain x3, and appropriate antibiotic coverage . -  exploratory  laparotomy with lysis of adhesion, small bowel resection with loop ileostomy, and placement of wound VAC by Dr. Georgette Dover 04/30/2018. -Abscess culture and Candida tropicalis 04/23/2018, and seems 04/16/2018 when she is on Diflucan and meropenem. -She remains n.p.o. on NGT ILS given postoperative ileus, continue with TPN. -Remains with elevated leukocytosis after surgery, continue to monitor closely,  Acute right upper extremity DVT extremity -  due to PICC line this was just discontinued April 16, 2018 still swollen but improving there is no warmth or redness.  She was initially on heparin now she is on Lovenox  Acute kidney injury with chronic kidney disease stage III  - this has resolved she is back to baseline with creatinine of 1.2, creatinine is 1.41 today. On IV hydration.  Fibromyalgia  - continue Lyrica able to take oral  Hypertension improved.  -She is n.p.o., continue with scheduled IV metoprolol and PRN hydralazine  Type 2 diabetes mellitus controlled -Continue with insulin sliding scale every 4 hours as she is on TPN, CBG acceptable .  Debility. -   PT has been consulted they recommend SNF.   Acute on chronic anemia  -He required total of 4 units PRBC transfusion during hospital stay .monitor closely and transfuse as needed .  Depression patient recently started on Lexapro  Paroxysmal atrial tachycardia.  She has not had any more episodes of PAT since that of April 22, 2018 echocardiogram that was done during showed preserved ejection fraction her beta-blocker was increased  Hypokalemia/hyperkalemia -By pharmacy through TPN    DVT prophylaxis: Subcutaneous Lovenox treatment dose for right upper extremity DVT  code Status: Full Family Communication: none at bedside  Disposition Plan: Physical therapy recommended SNF Consultants:   General surgery   interventional radiology  Procedures: Echocardiogram Drains  percutaneous  x2 drainage of the abdominal  area Right arm PICC line Left arm PICC line exploratory laparotomy with lysis of adhesion, small bowel resection with loop ileostomy, and placement of wound VAC by Dr. Georgette Dover 04/30/2018.   Antimicrobials:  Meropenem  Floconazole    Objective: Vitals:   05/01/18 1815 05/01/18 2005 05/02/18 0418 05/02/18 1521  BP: (!) 144/59 (!) 152/72 (!) 154/60 (!) 162/73  Pulse: 82 78 87 89  Resp:  16 18 18   Temp:  97.7 F (36.5 C) 98.6 F (37 C)   TempSrc:  Oral Oral   SpO2:  98% 98% 98%  Weight:      Height:        Intake/Output Summary (Last 24 hours) at 05/02/2018 1543 Last data filed at 05/02/2018 1225 Gross per 24 hour  Intake 2045.94 ml  Output 4305 ml  Net -2259.06 ml   Filed Weights   04/19/18 0500 04/23/18 0500 04/24/18 0500  Weight: 123 kg 118.2 kg 118.3 kg    Examination:  Sleeping comfortably, but wakes up and answer questions appropriately, in no apparent distress . Symmetrical Chest wall movement, Good air movement bilaterally, CTAB RRR,No Gallops,Rubs or new Murmurs, No Parasternal Heave Bowel sounds present, diminished, with midline surgical wound VAC, right ileostomyileostomy bag, left JP drain with sanguinous material . No Cyanosis, Clubbing or edema, No new Rash or bruise       Data Reviewed: I have personally reviewed following labs and imaging studies  CBC: Recent Labs  Lab 04/28/18 0305 04/29/18 0324 04/30/18 0417 05/01/18 0510 05/01/18 0852 05/02/18 0505  WBC 10.2 9.8 8.9 21.0* 20.8* 22.2*  NEUTROABS 7.7  --   --   --   --   --   HGB 7.7* 7.7* 7.5* 10.7* 10.1* 9.6*  HCT 24.9* 24.4* 24.4* 34.0* 32.9* 31.1*  MCV 93.6 93.1 94.2 92.9 94.0 94.5  PLT 348 339 316 294 311 175   Basic Metabolic Panel: Recent Labs  Lab 04/27/18 0321 04/28/18 0305 04/29/18 0324 04/30/18 0417 05/01/18 0510 05/01/18 0852 05/02/18 0505  NA 138 137 136 139 138 138 140  K 3.6 3.3* 3.6 3.8 5.6* 5.4* 4.4  CL 92* 95* 94* 98 102 102 100  CO2 36* 34* 33* 32 29 29  32  GLUCOSE 131* 142* 130* 140* 257* 245* 177*  BUN 37* 44* 49* 46* 47* 47* 52*  CREATININE 1.49* 1.42* 1.41* 1.38* 1.16* 1.02* 1.06*  CALCIUM 8.3* 8.2* 8.4* 8.4* 8.3* 8.2* 8.5*  MG 1.6* 1.6* 1.9  --  1.6*  --  1.9  PHOS 3.5 3.9  --   --  3.9  --   --    GFR: Estimated Creatinine Clearance: 61.3 mL/min (A) (by C-G formula based on SCr of 1.06 mg/dL (H)). Liver Function Tests: Recent Labs  Lab 04/28/18 0305 05/01/18 0510  AST 30 36  ALT 17 21  ALKPHOS 54 70  BILITOT 0.4 0.5  PROT 4.5* 5.1*  ALBUMIN 1.7* 1.8*   No results for input(s): LIPASE, AMYLASE in the last 168 hours. No results for input(s): AMMONIA in the last 168 hours. Coagulation Profile: No results for input(s): INR, PROTIME in the last 168 hours. Cardiac Enzymes: No results for input(s): CKTOTAL, CKMB, CKMBINDEX, TROPONINI in the last 168 hours. BNP (last 3 results) No results for input(s): PROBNP in the last 8760 hours. HbA1C: No results for input(s): HGBA1C in the last 72 hours. CBG: Recent Labs  Lab 05/01/18 2001 05/02/18 1025 05/02/18 8527  05/02/18 0826 05/02/18 1223  GLUCAP 199* 194* 163* 153* 153*   Lipid Profile: No results for input(s): CHOL, HDL, LDLCALC, TRIG, CHOLHDL, LDLDIRECT in the last 72 hours. Thyroid Function Tests: No results for input(s): TSH, T4TOTAL, FREET4, T3FREE, THYROIDAB in the last 72 hours. Anemia Panel: No results for input(s): VITAMINB12, FOLATE, FERRITIN, TIBC, IRON, RETICCTPCT in the last 72 hours. Urine analysis:    Component Value Date/Time   COLORURINE YELLOW 04/05/2018 1924   APPEARANCEUR CLEAR 04/05/2018 1924   LABSPEC 1.021 04/05/2018 1924   PHURINE 5.0 04/05/2018 1924   GLUCOSEU NEGATIVE 04/05/2018 1924   HGBUR NEGATIVE 04/05/2018 1924   BILIRUBINUR NEGATIVE 04/05/2018 [place] NEGATIVE 04/05/2018 1924   PROTEINUR NEGATIVE 04/05/2018 1924   UROBILINOGEN 0.2 06/20/2013 1832   NITRITE NEGATIVE 04/05/2018 1924   LEUKOCYTESUR NEGATIVE 04/05/2018 1924    Sepsis Labs: @LABRCNTIP (procalcitonin:4,lacticidven:4)  ) Recent Results (from the past 240 hour(s))  Aerobic/Anaerobic Culture (surgical/deep wound)     Status: None   Collection Time: 04/23/18  3:20 PM  Result Value Ref Range Status   Specimen Description ABSCESS RIGHT LOWER QUADRANT  Final   Special Requests Normal  Final   Gram Stain NO WBC SEEN NO ORGANISMS SEEN   Final   Culture   Final    FEW CANDIDA TROPICALIS NO ANAEROBES ISOLATED Performed at [facility], [place] [address] [place], [place] [address]    Report Status 04/28/2018 FINAL  Final  C difficile quick scan w PCR reflex     Status: Abnormal   Collection Time: 04/28/18  9:01 PM  Result Value Ref Range Status   C Diff antigen POSITIVE (A) NEGATIVE Final   C Diff toxin NEGATIVE NEGATIVE Final   C Diff interpretation Results are indeterminate. See PCR results.  Final    Comment: Performed at [facility], [place] [address] [place], [place] [address]  C. Diff by PCR, Reflexed     Status: None   Collection Time: 04/28/18  9:01 PM  Result Value Ref Range Status   Toxigenic C. Difficile by PCR NEGATIVE NEGATIVE Final    Comment: Patient is colonized with non toxigenic C. difficile. May not need treatment unless significant symptoms are present. Performed at [facility], [place] [address], [place], [place] [address]   Surgical pcr screen     Status: None   Collection Time: 04/29/18  6:05 PM  Result Value Ref Range Status   MRSA, PCR NEGATIVE NEGATIVE Final   Staphylococcus aureus NEGATIVE NEGATIVE Final    Comment: (NOTE) The Xpert SA Assay (FDA approved for NASAL specimens in patients 29 years of age and older), is one component of a comprehensive surveillance program. It is not intended to diagnose infection nor to guide or monitor treatment. Performed at [facility], [place] [address], [place], [place] [address]          Radiology Studies: No results found.      Scheduled  Meds: . sodium chloride   Intravenous Once  . Chlorhexidine Gluconate Cloth  6 each Topical Daily  . enoxaparin (LOVENOX) injection  120 mg Subcutaneous Q12H  . furosemide  40 mg Intravenous Daily  . insulin aspart  0-15 Units Subcutaneous Q4H  . latanoprost  1 drop Both Eyes QHS  . mouth rinse  15 mL Mouth Rinse BID  . metoprolol tartrate  5 mg Intravenous Q6H  . nystatin   Topical TID  . pregabalin  75 mg Oral QHS  .  sodium chloride flush  5 mL Intracatheter Q8H  . timolol  1 drop Both Eyes BID   Continuous Infusions: . sodium chloride Stopped (05/02/18 0618)  . fluconazole (DIFLUCAN) IV 100 mL/hr at 05/02/18 1000  . meropenem (MERREM) IV 1 g (05/02/18 1356)  . methocarbamol (ROBAXIN) IV    . TPN ADULT (ION) 80 mL/hr at 05/02/18 1000  . TPN ADULT (ION)       LOS: 25 days      Phillips Climes, MD [facility] Pager [phone]  If 7PM-7AM, please contact night-coverage [email or web address] Password [facility] 05/02/2018, 3:43 PM

## 2018-05-02 NOTE — Consult Note (Signed)
[place] Nurse wound follow up Wound type:Midline abdominal with RMQ ileostomy Measurement: 23 cm x 4. cm x 4.5 cm Wound bed: beefy red, some adipose present Drainage (amount, consistency, odor) Minimal serosanguinous  No odor Periwound:RMQ ileostomy Dressing procedure/placement/frequency: Cleanse midline wound with NS. Apply 1 piece black foam to wound bed.  Seal immediately achieved at 125 mmHg.  Change M/W/F.   WOC Nurse ostomy follow up Stoma type/location: RMQ ilesotomy.  "skin retention" is present and sutured in place.  [place], [place] is present at bedside.  Stomal assessment/size: 1 1/8" pink and moist. Flush from 6 to 9:00  Liquid brown effluent present in pouch. NG tube in place Peristomal assessment: intact.  Midline abdominal incision with NPWT.  Coordinating care of both. Treatment options for stomal/peristomal skin: barrier ring and 1 piece convex pouch Output liquid brown stool Ostomy pouching: 1pc. Convex and barrier ring.  Education provided: Spouse observing pouch change.  Demonstrated and discussed cleansing, measuring, cut to fit.  Rationale for barrier ring and application of pouch demonstrated and discussed.  Enrolled patient in [place] Start Discharge program: Yes [place] team will follow.  Domenic Moras MSN, RN, FNP-BC CWON Wound, Ostomy, Continence Nurse Pager [phone]

## 2018-05-02 NOTE — Progress Notes (Signed)
Patient request to see staff in room, upon entering room patient request to see "Upper Administration", informed that I was the charge nurse and encouraged her to share her concerns.  Patient asked if Ronalee Belts her nurse was near by I informed that he was not she then stated "well good because he is possessed by demons, and his followers enter my room when he leaves".  I assessed her orientation and she is fully alert and oriented x 4.  She request to speak with upper administration and AC Chrystal RN was notified to come and speak with patient.  Message left for husband to return a call to facility for patient status.  Once [facility] arrived patient was apologetic and request to speak with Ronalee Belts to apologize for her accusations. She agreed to allow Ronalee Belts to continue to care for her.  Stable condition with follow up, remains alert and oriented.  Will continue to monitor.

## 2018-05-02 NOTE — Progress Notes (Signed)
Patient sat on edge of bed and in stedy for 25 minutes while bed was changed. Patient requesting purewick at this time due to urinating on self in stedy trying to get to bed side commode. While up in steady asked if patient wanted to sit in chair. Patient refused at this time.

## 2018-05-02 NOTE — Progress Notes (Addendum)
PHARMACY - ADULT TOTAL PARENTERAL NUTRITION CONSULT NOTE   Pharmacy Consult for TPN Indication: perforated diverticulitis  Patient Measurements: Height: 5' 5.5" (166.4 cm) Weight: 260 lb 12.9 oz (118.3 kg) IBW/kg (Calculated) : 58.15 TPN AdjBW (KG): 75.1 Body mass index is 42.74 kg/m.  Assessment: Admitted 10/26 with acute sigmoid diverticulitis without perf or abscess.   PMH: HTN, HLD, CKD3, RA, DM, CAD, CVA, CRI, fibromyalgia,morbid obesity, chronic pain, RUE DVT  GI: Acute perforated sigmoid diverticulitis with multiple abscesses. Patient has continued to have inadequate oral intake since admission. Soft diet with poor intake. Complains of abdominal pain and suppressed appetite w/ some nausea. Pre-albumin 6.6>7.3. LBM 11/16. Returned to OR on 11/20 for abscess drainage and wound vac placement, Drain output 476ml. - po PPI  11/5: at least 3 fluid collectionsin the lower abdomen/pelvis with previous perforated diverticulitis; 11/12 CT: resolution of large pelvic abscess, minimal residual mesenteric abscess although this fluid appears to communicate with a second collection in the mesentery in the LEFT mid abdomen adjacent to a small bowel loop, and persistent large extraluminal gas collection in the RIGHT pelvis-place additional 3rd drain.  Endo: A1C 5.9. (glipizide PTA)  DM, CBGs 153 - 199 Insulin requirements in the past 24 hours: 9 units  Lytes: WNL  Renal:CKD3. Scr 1.06 (BL ~ 1.07). - 3.8 L/24 hrs Pulm: [place] Cards: VSS on IV Lasix, metoprolol, Pravastatin Hepatobil: LFTs normalized  Neuro: RA, fibromyalgia, h/o CVA. Scheduled tylenol, Lexapro, Lyrica QA:STMHDQ from perforated sigmoid diverticulitis on Merrem, Flagyl and fluconazole, WBC 8.9>21, afebrile  Abscess from 11/13 growing few candida tropicalis  Anticoag: RUE PICC DVT on LMWH  TPN Access: removed 11/6 for DVT,  new 04/23/18 TPN start date:04/23/18 Nutritional Goals (per RD recommendation on 04/29/18): Kcal:   1800-2000 Protein:  90-105 grams Fluid:  1.8-2.0 L  Goal TPN rate is 80 ml/hr   Current Nutrition: NPO, new TPN 11/13.   Plan:  - Continue TPN at 76mL/hr. - TPN (at goal 80 ml/hr) provides 96 g of protein, 268 g of dextrose, and 57 g of lipids which provides 1807 kCals per day, meeting 100% of patient needs Electrolytes in TPN: Max Cl, No potassium in bag per discussion per MD   MVI, trace elements, famotidine to TPN  SSI and adjust as needed - CBGs seem to be improving so will not adjust insulin today Monitor TPN labs Mon/Thurs and prn  Levester Fresh, PharmD, BCPS, BCCCP Clinical Pharmacist [phone]  Please check AMION for all [place] numbers  05/02/2018 9:42 AM

## 2018-05-02 NOTE — Progress Notes (Signed)
   05/02/18 1100  Clinical Encounter Type  Visited With Patient and family together  Visit Type Follow-up  Referral From Nurse  Consult/Referral To Chaplain  Stress Factors  Patient Stress Factors None identified  Family Stress Factors None identified   While doing rounds, Nurse requested a visit with PT because of the recent surgery. PT was alert and husband was at bedside. Husband stated that their pastor visited recently.Both were thankful for the visit. I offered spiritual care with words of encouragement, ministry of presence and a listening ear. Chaplain available as needed.   Chaplain Fidel Levy [phone]

## 2018-05-02 NOTE — Progress Notes (Addendum)
I was informed by the charge nurse at approximately 0000 that the patient had asked to speak with her, and told her that I was possessed by a demon and that my followers were coming in behind me.  After this the charge nurse Chaya Jan, myself, and nurse tech Percell Locus went into the room to try to confirm that the patient was referring to me, and she confirmed that she was.  At this point [facility] asked the patient if she would like a different nurse to care for her and she said that she did not want me to care for her anymore.  At this point care was transferred to the charge nurse Garceno.  A/C notified.  On-call provider C. Bodenheimer notified.    0030 AddendumChaya Jan and I attempted to call the patient's husband Herbie [place], and did not receive an answer.  Cassey left a message on the voicemail.    0111 Addendum:  AC Crystal came to the floor to speak with the patient.  Before this, Nurse Nolen Mu had informed me that the patient specifically asked to speak with me.  I entered the room with Audiological scientist and asked the patient what I could do for her.  She was crying, and proceeded to tell me that she thinks that she was in a deep sleep and was coming out of a dream when she said the things that she said earlier.  She apologized.  I accepted her apology and assured her that I just wanted to be certain that there wasn't anything she felt I had done wrong.  She said that she did not feel I had done anything wrong.  After this, the patient asked for a cup of ice which I provided her.

## 2018-05-02 NOTE — Progress Notes (Signed)
Referring Physician(s): Dr Jonny Ruiz  Supervising Physician: Daryll Brod  Patient Status:  [facility] - In-pt  Chief Complaint:  divertic abscesses IR has one drain in place-- RLQ  Subjective:  Feeling some better Stronger OP from OR drain is bloody Minimal   Allergies: Lactose intolerance (gi); Butrans [buprenorphine]; Penicillins; and Simvastatin  Medications: Prior to Admission medications   Medication Sig Start Date End Date Taking? Authorizing Provider  Blood Glucose Monitoring Suppl (FREESTYLE LITE) DEVI See admin instructions. 12/22/14  Yes [provider]  ciprofloxacin (CIPRO) 500 MG/5ML (10%) suspension Take by mouth 2 (two) times daily.   Yes [provider]  clopidogrel (PLAVIX) 75 MG tablet Take 1 tablet (75 mg total) by mouth daily with breakfast. 01/14/18  Yes Rehman, Mechele [place], MD  Dexlansoprazole 30 MG capsule Take 40 mg by mouth daily.   Yes [provider]  diclofenac sodium (VOLTAREN) 1 % GEL Apply 2 g topically 4 (four) times daily as needed (Pain).    Yes [provider]  glipiZIDE (GLUCOTROL) 10 MG tablet Take 10 mg by mouth daily before breakfast.   Yes [provider]  HYDROcodone-acetaminophen (NORCO) 10-325 MG tablet Take 1 tablet by mouth 4 (four) times daily.   Yes [provider]  IRON PO Take 1 tablet by mouth daily.   Yes [provider]  isosorbide dinitrate (ISORDIL) 5 MG tablet Take 1 tablet (5 mg total) by mouth 3 (three) times daily before meals. 02/07/18  Yes Rehman, Mechele [place], MD  leflunomide (ARAVA) 20 MG tablet Take 20 mg by mouth daily.   Yes [provider]  metoprolol succinate (TOPROL-XL) 25 MG 24 hr tablet Take 25 mg by mouth daily.   Yes [provider]  metroNIDAZOLE (FLAGYL) 250 MG tablet Take 250 mg by mouth 3 (three) times daily.   Yes [provider]  Olmesartan-amLODIPine-HCTZ (TRIBENZOR) 40-5-25 MG TABS Take 1 tablet by mouth daily.    Yes  [provider]  pravastatin (PRAVACHOL) 40 MG tablet Take 40 mg by mouth at bedtime.   Yes [provider]  predniSONE (DELTASONE) 5 MG tablet Take 5 mg by mouth daily with breakfast.   Yes [provider]  pregabalin (LYRICA) 75 MG capsule Take 75 mg by mouth 3 (three) times daily.   Yes [provider]  Tafluprost (ZIOPTAN) 0.0015 % SOLN Place 1 drop into both eyes at bedtime.    Yes [provider]  temazepam (RESTORIL) 30 MG capsule Take 30 mg by mouth at bedtime.   Yes [provider]  timolol (BETIMOL) 0.5 % ophthalmic solution Place 1 drop into both eyes 2 (two) times daily.    Yes [provider]  Tofacitinib Citrate (XELJANZ) 5 MG TABS Take 1 tablet by mouth daily.   Yes [provider]  VITAMIN D, ERGOCALCIFEROL, PO Take 50,000 Units by mouth once a week. Thursdays   Yes [provider]  docusate sodium (COLACE) 100 MG capsule Take 2 capsules (200 mg total) by mouth at bedtime. Patient taking differently: Take 200 mg by mouth as needed.  02/13/18   Rogene Houston, MD     Vital Signs: BP (!) 154/60 (BP Location: Left Leg)   Pulse 87   Temp 98.6 F (37 C) (Oral)   Resp 18   Ht 5' 5.5" (1.664 m)   Wt 260 lb 12.9 oz (118.3 kg)   SpO2 98%   BMI 42.74 kg/m   Physical Exam  Abdominal: There  is tenderness.  Skin: Skin is warm and dry.  Site of drain is clean and dry No bleeding OP minimal-- bloody  New wound vac since recent surgery 2 Surgical drains- left abd  Vitals reviewed.   Imaging: No results found.  Labs:  CBC: Recent Labs    04/30/18 0417 05/01/18 0510 05/01/18 0852 05/02/18 0505  WBC 8.9 21.0* 20.8* 22.2*  HGB 7.5* 10.7* 10.1* 9.6*  HCT 24.4* 34.0* 32.9* 31.1*  PLT 316 294 311 314    COAGS: Recent Labs    04/06/18 1952 04/23/18 0329  INR 1.22 1.25  APTT 33  --     BMP: Recent Labs    04/30/18 0417 05/01/18 0510 05/01/18 0852 05/02/18 0505  NA 139 138  138 140  K 3.8 5.6* 5.4* 4.4  CL 98 102 102 100  CO2 32 29 29 32  GLUCOSE 140* 257* 245* 177*  BUN 46* 47* 47* 52*  CALCIUM 8.4* 8.3* 8.2* 8.5*  CREATININE 1.38* 1.16* 1.02* 1.06*  GFRNONAA 37* 46* 53* 51*  GFRAA 43* 53* >60 59*    LIVER FUNCTION TESTS: Recent Labs    04/15/18 0859 04/24/18 0833 04/28/18 0305 05/01/18 0510  BILITOT 1.0 0.6 0.4 0.5  AST 69* 55* 30 36  ALT 43 28 17 21   ALKPHOS 63 59 54 70  PROT 5.6* 4.7* 4.5* 5.1*  ALBUMIN 2.1* 1.7* 1.7* 1.8*    Assessment and Plan:  Diverticular abscesses Only 1 IR drain remains- RLQ OP minimal- bloody Will follow Plan per CCS  Electronically Signed: Johncarlo Maalouf A, PA-C 05/02/2018, 1:08 PM   I spent a total of 15 Minutes at the the patient's bedside AND on the patient's hospital floor or unit, greater than 50% of which was counseling/coordinating care for divertic abscesses

## 2018-05-02 NOTE — Progress Notes (Signed)
Bariatric recliner ordered for patient so she would be more comfortable when sitting up. Patient had family at bedside and refused to sit in chair. Per patient she will sit in chair tomorrow. Patient educated on importance of moving after surgery.

## 2018-05-03 LAB — GLUCOSE, CAPILLARY
GLUCOSE-CAPILLARY: 139 mg/dL — AB (ref 70–99)
GLUCOSE-CAPILLARY: 143 mg/dL — AB (ref 70–99)
GLUCOSE-CAPILLARY: 147 mg/dL — AB (ref 70–99)
GLUCOSE-CAPILLARY: 151 mg/dL — AB (ref 70–99)
GLUCOSE-CAPILLARY: 160 mg/dL — AB (ref 70–99)
Glucose-Capillary: 145 mg/dL — ABNORMAL HIGH (ref 70–99)

## 2018-05-03 LAB — BASIC METABOLIC PANEL
ANION GAP: 10 (ref 5–15)
BUN: 52 mg/dL — AB (ref 8–23)
CHLORIDE: 95 mmol/L — AB (ref 98–111)
CO2: 33 mmol/L — AB (ref 22–32)
CREATININE: 0.91 mg/dL (ref 0.44–1.00)
Calcium: 8.3 mg/dL — ABNORMAL LOW (ref 8.9–10.3)
GFR calc Af Amer: >60 mL/min (ref >60)
GFR calc non Af Amer: >60 mL/min (ref >60)
Glucose, Bld: 138 mg/dL — ABNORMAL HIGH (ref 70–99)
Potassium: 3.6 mmol/L (ref 3.5–5.1)
Sodium: 138 mmol/L (ref 135–145)

## 2018-05-03 LAB — CBC
HCT: 27.7 % — ABNORMAL LOW (ref 36.0–46.0)
HEMOGLOBIN: 8.8 g/dL — AB (ref 12.0–15.0)
MCH: 29.4 pg (ref 26.0–34.0)
MCHC: 31.8 g/dL (ref 30.0–36.0)
MCV: 92.6 fL (ref 80.0–100.0)
NRBC: 0.3 % — AB (ref 0.0–0.2)
Platelets: 261 10*3/uL (ref 150–400)
RBC: 2.99 MIL/uL — ABNORMAL LOW (ref 3.87–5.11)
RDW: 17.2 % — ABNORMAL HIGH (ref 11.5–15.5)
WBC: 14.6 10*3/uL — ABNORMAL HIGH (ref 4.0–10.5)

## 2018-05-03 MED ORDER — SALINE SPRAY 0.65 % NA SOLN
1.0000 | NASAL | Status: DC | PRN
  Filled 2018-05-03: qty 44

## 2018-05-03 MED ORDER — TRAVASOL 10 % IV SOLN
INTRAVENOUS | Status: AC
  Administered 2018-05-03: 17:00:00 via INTRAVENOUS
  Filled 2018-05-03: qty 960

## 2018-05-03 NOTE — Progress Notes (Signed)
PHARMACY - ADULT TOTAL PARENTERAL NUTRITION CONSULT NOTE   Pharmacy Consult for TPN Indication: perforated diverticulitis  Patient Measurements: Height: 5' 5.5" (166.4 cm) Weight: 260 lb 12.9 oz (118.3 kg) IBW/kg (Calculated) : 58.15 TPN AdjBW (KG): 75.1 Body mass index is 42.74 kg/m.  Assessment: Admitted 10/26 with acute sigmoid diverticulitis without perf or abscess.   PMH: HTN, HLD, CKD3, RA, DM, CAD, CVA, CRI, fibromyalgia,morbid obesity, chronic pain, RUE DVT  GI: Acute perforated sigmoid diverticulitis with multiple abscesses. Patient has continued to have inadequate oral intake since admission. Soft diet with poor intake. Complains of abdominal pain and suppressed appetite w/ some nausea. Pre-albumin 6.6>7.3. LBM 11/16. Returned to OR on 11/20 for abscess drainage and wound vac placement, Drain output 462ml. - po PPI  11/5: at least 3 fluid collectionsin the lower abdomen/pelvis with previous perforated diverticulitis; 11/12 CT: resolution of large pelvic abscess, minimal residual mesenteric abscess although this fluid appears to communicate with a second collection in the mesentery in the LEFT mid abdomen adjacent to a small bowel loop, and persistent large extraluminal gas collection in the RIGHT pelvis-place additional 3rd drain.  Endo: A1C 5.9. (glipizide PTA)  DM, CBGs 130s Insulin requirements in the past 24 hours: 11 units  Lytes: WNL, K decrease from 4.4>3.6  Renal:CKD3. Scr 1.06>0.91 (BL ~ 1.07). - 4.6 L/24 hrs Pulm: [place] 4L Cards: VSS on IV Lasix, metoprolol, Pravastatin Hepatobil: LFTs normalized  Neuro: RA, fibromyalgia, h/o CVA. Scheduled tylenol, Lexapro, Lyrica OF:BPZWCH from perforated sigmoid diverticulitis on Merrem, Flagyl and fluconazole, WBC 21>14.6, afebrile  Abscess from 11/13 growing few candida tropicalis  Anticoag: RUE PICC DVT on LMWH  TPN Access: removed 11/6 for DVT,  new 04/23/18 TPN start date:04/23/18 Nutritional Goals (per RD  recommendation on 04/29/18): Kcal:  1800-2000 Protein:  90-105 grams Fluid:  1.8-2.0 L  Goal TPN rate is 80 ml/hr   Current Nutrition: NPO, new TPN 11/13.   Plan:  Continue TPN at goal rate of 80 mL/hr TPN (at goal 80 ml/hr) provides 96 g of protein, 268 g of dextrose, and 57 g of lipids which provides 1873 kCals per day, meeting 100% of patient needs  Electrolytes in TPN: Max Cl, add K back  MVI, trace elements, famotidine to TPN  SSI and adjust as needed - CBGs improved Monitor TPN labs Mon/Thurs and BMP in AM  Bertis Ruddy, PharmD Clinical Pharmacist Please check AMION for all [place] numbers 05/03/2018 8:41 AM

## 2018-05-03 NOTE — Progress Notes (Signed)
Was called into room due to tube leaking. Upon entering room Pt's husband became verbally aggressive at this RN stating that it is unacceptable that the patients NG tube leaked again today and that what was done yesterday was not acceptable. This RN explained that this is the first time caring for patient and when shift report was given at 07:40 patients NG tube was not leaking. Husband still continued to be verbally aggressive to RN. This RN replaced all suction tubing to NG tube and stopped the leak.

## 2018-05-03 NOTE — Progress Notes (Signed)
Husband came out to desk to state patients nose is clogged. Upon entering room husband states patient is trying to pull NG tube out. This RN see NG sticker  Off of nose. While replacing NG sticker on nose patient and husband both grabbed at my hands and started yelling at me to stop. Educated patient and husband on importance on NG tube and why surgery ordered it. Patient demanding to speak with surgeon this morning stating this is not a hospital. This RN inquired what patient meant by that saying. Pt refused to speak with nurse.

## 2018-05-03 NOTE — Progress Notes (Signed)
PROGRESS NOTE    [name]  NUU:[id number] DOB: [birth year] DOA: 04/05/2018 PCP: [name], MD    Brief Narrative:   72 year old female with past medical history of hypertension, type 2 diabetes mellitus chronic kidney disease rheumatoid arthritis coronary artery disease S/P CABG on Plavix who was admitted with abdominal pain and found to have sigmoid diverticulitis on April 05, 2018 with perforation and abscess.  Initially was felt to be improving on IV Merrem but she continued to have abdominal pain. CT scan of the abdomen and pelvis was obtained April 15, 2018 and was significant for worsening diverticulitis with abdominal fluid collections, she was transferred to [facility] for IR evaluation, he did receive total of 3 percutaneous drains, with repeat CT abdomen pelvis 04/27/2018, still showing remaining fluid collection/abscess despite he strains, and appropriate antibiotic coverage, patient went for exploratory laparotomy with lysis of adhesion, small bowel resection with loop ileostomy, and placement of wound VAC by Dr. Georgette Dover 04/30/2018.  SUBJECTIVE:  Patient in bed, appears comfortable, denies any headache, no fever, no chest pain or pressure, no shortness of breath , no abdominal pain. No focal weakness.   Assessment & Plan:    Perforated sigmoid diverticulitis/abdominal abscess : General surgery following, initially managed conservatively with multiple abdominal drain placements however repeat CT scan on 04/27/2018 showed worsening of abscess and disease process after which she was taken to the OR for laparotomy with lysis of adhesion, small bowel resection, loop ileostomy and placement of wound VAC by Dr. Georgette Dover on 04/30/2018.  Abscess cultures noted which are growing Candida, Clostridium, currently on  meropenem and Diflucan IV.  Continue bowel rest, NG tube in place, IV TNA continued.  Clinically improving.  Acute right upper extremity DVT extremity  -  due to PICC line  this was just discontinued April 16, 2018 still swollen but improving there is no warmth or redness.  She was initially on heparin now she is on Lovenox.  Acute kidney injury with chronic kidney disease stage III  -recent creatinine of 1.2, ARF resolved currently close to baseline. Fibromyalgia  - continue Lyrica able to take oral  Hypertension improved.  -She is n.p.o., continue with scheduled IV metoprolol and PRN hydralazine  Debility. -   PT has been consulted they recommend SNF.   Acute on chronic anemia  -He required total of 4 units PRBC transfusion during hospital stay .monitor closely and transfuse as needed .  Depression patient recently started on Lexapro  Paroxysmal atrial tachycardia.  She has not had any more episodes of PAT since that of April 22, 2018 echocardiogram that was done during showed preserved ejection fraction her beta-blocker was increased  Hypokalemia/hyperkalemia -By pharmacy through TPN   Type 2 diabetes mellitus controlled -Continue with insulin sliding scale every 4 hours as she is on TPN, CBG acceptable .  CBG (last 3)  Recent Labs    05/03/18 0004 05/03/18 0452 05/03/18 0821  GLUCAP 151* 139* 145*     DVT prophylaxis: Subcutaneous Lovenox treatment dose for right upper extremity DVT  code Status: Full Family Communication: none at bedside  Disposition Plan: Physical therapy recommended SNF Consultants:   General surgery  IR  Procedures: Echocardiogram Drains  percutaneous  x2 drainage of the abdominal area Right arm PICC line Left arm PICC line exploratory laparotomy with lysis of adhesion, small bowel resection with loop ileostomy, and placement of wound VAC by Dr. Georgette Dover 04/30/2018.   Antimicrobials:  Meropenem  Floconazole    Objective: Vitals:  05/02/18 0418 05/02/18 1521 05/02/18 2009 05/03/18 0454  BP: (!) 154/60 (!) 162/73 (!) 160/64 (!) 152/69  Pulse: 87 89 88 89  Resp: 18 18 18 18   Temp: 98.6 F (37  C)  98 F (36.7 C) 98.4 F (36.9 C)  TempSrc: Oral  Oral   SpO2: 98% 98% 99% 100%  Weight:      Height:        Intake/Output Summary (Last 24 hours) at 05/03/2018 1133 Last data filed at 05/03/2018 1112 Gross per 24 hour  Intake 1832.05 ml  Output 5070 ml  Net -3237.95 ml   Filed Weights   04/19/18 0500 04/23/18 0500 04/24/18 0500  Weight: 123 kg 118.2 kg 118.3 kg    Examination:  Awake Alert, Oriented X 3, No new F.N deficits, Normal affect [place].AT,PERRAL Supple Neck,No JVD, No cervical lymphadenopathy appriciated.  Symmetrical Chest wall movement, Good air movement bilaterally, CTAB RRR,No Gallops, Rubs or new Murmurs, No Parasternal Heave +ve B.Sounds, Abd Soft, Non tender with midline surgical wound VAC, right ileostomyileostomy bag, left JP drain with sanguinous material .  NG tube in place, No Cyanosis, Clubbing or edema, No new Rash or bruise   Data Reviewed: I have personally reviewed following labs and imaging studies  CBC: Recent Labs  Lab 04/28/18 0305  04/30/18 0417 05/01/18 0510 05/01/18 0852 05/02/18 0505 05/03/18 0333  WBC 10.2   < > 8.9 21.0* 20.8* 22.2* 14.6*  NEUTROABS 7.7  --   --   --   --   --   --   HGB 7.7*   < > 7.5* 10.7* 10.1* 9.6* 8.8*  HCT 24.9*   < > 24.4* 34.0* 32.9* 31.1* 27.7*  MCV 93.6   < > 94.2 92.9 94.0 94.5 92.6  PLT 348   < > 316 294 311 314 261   < > = values in this interval not displayed.   Basic Metabolic Panel: Recent Labs  Lab 04/27/18 0321 04/28/18 0305 04/29/18 0324 04/30/18 0417 05/01/18 0510 05/01/18 0852 05/02/18 0505 05/03/18 0333  NA 138 137 136 139 138 138 140 138  K 3.6 3.3* 3.6 3.8 5.6* 5.4* 4.4 3.6  CL 92* 95* 94* 98 102 102 100 95*  CO2 36* 34* 33* 32 29 29 32 33*  GLUCOSE 131* 142* 130* 140* 257* 245* 177* 138*  BUN 37* 44* 49* 46* 47* 47* 52* 52*  CREATININE 1.49* 1.42* 1.41* 1.38* 1.16* 1.02* 1.06* 0.91  CALCIUM 8.3* 8.2* 8.4* 8.4* 8.3* 8.2* 8.5* 8.3*  MG 1.6* 1.6* 1.9  --  1.6*  --  1.9   --   PHOS 3.5 3.9  --   --  3.9  --   --   --    GFR: Estimated Creatinine Clearance: 71.4 mL/min (by C-G formula based on SCr of 0.91 mg/dL). Liver Function Tests: Recent Labs  Lab 04/28/18 0305 05/01/18 0510  AST 30 36  ALT 17 21  ALKPHOS 54 70  BILITOT 0.4 0.5  PROT 4.5* 5.1*  ALBUMIN 1.7* 1.8*   No results for input(s): LIPASE, AMYLASE in the last 168 hours. No results for input(s): AMMONIA in the last 168 hours. Coagulation Profile: No results for input(s): INR, PROTIME in the last 168 hours. Cardiac Enzymes: No results for input(s): CKTOTAL, CKMB, CKMBINDEX, TROPONINI in the last 168 hours. BNP (last 3 results) No results for input(s): PROBNP in the last 8760 hours. HbA1C: No results for input(s): HGBA1C in the last 72 hours. CBG: Recent Labs  Lab 05/02/18 1606 05/02/18 2006 05/03/18 0004 05/03/18 0452 05/03/18 0821  GLUCAP 136* 159* 151* 139* 145*   Lipid Profile: No results for input(s): CHOL, HDL, LDLCALC, TRIG, CHOLHDL, LDLDIRECT in the last 72 hours. Thyroid Function Tests: No results for input(s): TSH, T4TOTAL, FREET4, T3FREE, THYROIDAB in the last 72 hours. Anemia Panel: No results for input(s): VITAMINB12, FOLATE, FERRITIN, TIBC, IRON, RETICCTPCT in the last 72 hours. Urine analysis:    Component Value Date/Time   COLORURINE YELLOW 04/05/2018 1924   APPEARANCEUR CLEAR 04/05/2018 1924   LABSPEC 1.021 04/05/2018 1924   PHURINE 5.0 04/05/2018 1924   GLUCOSEU NEGATIVE 04/05/2018 1924   HGBUR NEGATIVE 04/05/2018 1924   BILIRUBINUR NEGATIVE 04/05/2018 [place] NEGATIVE 04/05/2018 1924   PROTEINUR NEGATIVE 04/05/2018 1924   UROBILINOGEN 0.2 06/20/2013 1832   NITRITE NEGATIVE 04/05/2018 1924   LEUKOCYTESUR NEGATIVE 04/05/2018 1924   Sepsis Labs: @LABRCNTIP (procalcitonin:4,lacticidven:4)  ) Recent Results (from the past 240 hour(s))  Aerobic/Anaerobic Culture (surgical/deep wound)     Status: None   Collection Time: 04/23/18  3:20 PM    Result Value Ref Range Status   Specimen Description ABSCESS RIGHT LOWER QUADRANT  Final   Special Requests Normal  Final   Gram Stain NO WBC SEEN NO ORGANISMS SEEN   Final   Culture   Final    FEW CANDIDA TROPICALIS NO ANAEROBES ISOLATED Performed at [facility], [place] [address], [place], [place] [address]    Report Status 04/28/2018 FINAL  Final  C difficile quick scan w PCR reflex     Status: Abnormal   Collection Time: 04/28/18  9:01 PM  Result Value Ref Range Status   C Diff antigen POSITIVE (A) NEGATIVE Final   C Diff toxin NEGATIVE NEGATIVE Final   C Diff interpretation Results are indeterminate. See PCR results.  Final    Comment: Performed at [facility], [place] [address], [place], [place] [address]  C. Diff by PCR, Reflexed     Status: None   Collection Time: 04/28/18  9:01 PM  Result Value Ref Range Status   Toxigenic C. Difficile by PCR NEGATIVE NEGATIVE Final    Comment: Patient is colonized with non toxigenic C. difficile. May not need treatment unless significant symptoms are present. Performed at [facility], [place] [address], [place], [place] [address]   Surgical pcr screen     Status: None   Collection Time: 04/29/18  6:05 PM  Result Value Ref Range Status   MRSA, PCR NEGATIVE NEGATIVE Final   Staphylococcus aureus NEGATIVE NEGATIVE Final    Comment: (NOTE) The Xpert SA Assay (FDA approved for NASAL specimens in patients 57 years of age and older), is one component of a comprehensive surveillance program. It is not intended to diagnose infection nor to guide or monitor treatment. Performed at [facility], [place] [address] [place], [place] [address]      Radiology Studies: No results found.   Scheduled Meds: . sodium chloride   Intravenous Once  . Chlorhexidine Gluconate Cloth  6 each Topical Daily  . enoxaparin (LOVENOX) injection  120 mg Subcutaneous Q12H  . furosemide  40 mg Intravenous Daily  . insulin aspart  0-15 Units  Subcutaneous Q4H  . latanoprost  1 drop Both Eyes QHS  . mouth rinse  15 mL Mouth Rinse BID  . metoprolol tartrate  5 mg Intravenous Q6H  . nystatin   Topical TID  . pregabalin  75 mg Oral  QHS  . sodium chloride flush  5 mL Intracatheter Q8H  . timolol  1 drop Both Eyes BID   Continuous Infusions: . sodium chloride Stopped (05/03/18 0616)  . fluconazole (DIFLUCAN) IV 400 mg (05/03/18 1000)  . meropenem (MERREM) IV 1 g (05/03/18 0616)  . methocarbamol (ROBAXIN) IV    . TPN ADULT (ION) 80 mL/hr at 05/02/18 1813  . TPN ADULT (ION)       LOS: 26 days   Signature  Lala Lund M.D on 05/03/2018 at 11:33 AM   -  To page go to [email or web address] - password [facility]

## 2018-05-03 NOTE — Progress Notes (Signed)
[facility] Progress Note  3 Days Post-Op  Subjective: CC-  Innumerable complaints this morning. Issues with NG, they were too rough moving her to chair, she states a mouthcare sponge piece broke off and is stuck in her throat, wants pulse ox off, etc. Denies nausea. Pain controlled.   Objective: Vital signs in last 24 hours: Temp:  [98 F (36.7 C)-98.4 F (36.9 C)] 98.4 F (36.9 C) (11/23 0454) Pulse Rate:  [88-89] 89 (11/23 0454) Resp:  [18] 18 (11/23 0454) BP: (152-162)/(64-73) 152/69 (11/23 0454) SpO2:  [98 %-100 %] 100 % (11/23 0454) Last BM Date: 05/02/18  Intake/Output from previous day: 11/22 0701 - 11/23 0700 In: 2537.3 [I.V.:2032.3; IV Piggyback:500.1] Out: 8850 [Urine:4400; Drains:120; Stool:100] Intake/Output this shift: No intake/output data recorded.  PE: Gen: Alert, NAD HEENT: EOM's intact, pupils equal and round Pulm: effort normal YDX:AJOIN, soft, dark RLQ ileostomy with liquid output. , vac to midline incision, JP drain x2 with SS output, IR drain x1 with trace thin/purulent drainagein bag Psych: A&Ox3 Skin: no rashes noted, warm and dry  Lab Results:  Recent Labs    05/02/18 0505 05/03/18 0333  WBC 22.2* 14.6*  HGB 9.6* 8.8*  HCT 31.1* 27.7*  PLT 314 261   BMET Recent Labs    05/02/18 0505 05/03/18 0333  NA 140 138  K 4.4 3.6  CL 100 95*  CO2 32 33*  GLUCOSE 177* 138*  BUN 52* 52*  CREATININE 1.06* 0.91  CALCIUM 8.5* 8.3*   PT/INR No results for input(s): LABPROT, INR in the last 72 hours. CMP     Component Value Date/Time   NA 138 05/03/2018 0333   K 3.6 05/03/2018 0333   CL 95 (L) 05/03/2018 0333   CO2 33 (H) 05/03/2018 0333   GLUCOSE 138 (H) 05/03/2018 0333   BUN 52 (H) 05/03/2018 0333   CREATININE 0.91 05/03/2018 0333   CALCIUM 8.3 (L) 05/03/2018 0333   PROT 5.1 (L) 05/01/2018 0510   ALBUMIN 1.8 (L) 05/01/2018 0510   AST 36 05/01/2018 0510   ALT 21 05/01/2018 0510   ALKPHOS 70 05/01/2018 0510   BILITOT 0.5 05/01/2018 0510   GFRNONAA >60 05/03/2018 0333   GFRAA >60 05/03/2018 0333   Lipase  No results found for: LIPASE     Studies/Results: No results found.  Anti-infectives: Anti-infectives (From admission, onward)   Start     Dose/Rate Route Frequency Ordered Stop   05/01/18 1600  meropenem (MERREM) 1 g in sodium chloride 0.9 % 100 mL IVPB     1 g 200 mL/hr over 30 Minutes Intravenous Every 8 hours 05/01/18 1539     04/30/18 1215  fluconazole (DIFLUCAN) IVPB 400 mg  Status:  Discontinued     400 mg 100 mL/hr over 120 Minutes Intravenous To Surgery 04/30/18 1200 05/01/18 1215   04/29/18 1100  fluconazole (DIFLUCAN) IVPB 400 mg     400 mg 100 mL/hr over 120 Minutes Intravenous Every 24 hours 04/29/18 1006     04/27/18 1500  meropenem (MERREM) 1 g in sodium chloride 0.9 % 100 mL IVPB  Status:  Discontinued     1 g 200 mL/hr over 30 Minutes Intravenous Every 12 hours 04/27/18 1008 05/01/18 1539   04/26/18 1300  metroNIDAZOLE (FLAGYL) IVPB 500 mg  Status:  Discontinued     500 mg 100 mL/hr over 60 Minutes Intravenous Every 6 hours 04/26/18 1257 04/27/18 1041   04/15/18 1700  meropenem (MERREM) 1 g in sodium chloride 0.9 %  100 mL IVPB  Status:  Discontinued     1 g 200 mL/hr over 30 Minutes Intravenous Every 8 hours 04/15/18 1534 04/27/18 1008   04/09/18 2200  meropenem (MERREM) 1 g in sodium chloride 0.9 % 100 mL IVPB  Status:  Discontinued     1 g 200 mL/hr over 30 Minutes Intravenous Every 8 hours 04/09/18 1244 04/14/18 0944   04/06/18 1300  meropenem (MERREM) 1 g in sodium chloride 0.9 % 100 mL IVPB  Status:  Discontinued     1 g 200 mL/hr over 30 Minutes Intravenous Every 12 hours 04/06/18 1145 04/09/18 1244   04/06/18 1145  meropenem (MERREM) 1 g in sodium chloride 0.9 % 100 mL IVPB  Status:  Discontinued     1 g 200 mL/hr over 30 Minutes Intravenous Every 8 hours 04/06/18 1139 04/06/18 1145   04/06/18 0900  ciprofloxacin (CIPRO) IVPB 400 mg  Status:  Discontinued      400 mg 200 mL/hr over 60 Minutes Intravenous Every 12 hours 04/06/18 0732 04/06/18 1139   04/06/18 0300  metroNIDAZOLE (FLAGYL) IVPB 500 mg  Status:  Discontinued     500 mg 100 mL/hr over 60 Minutes Intravenous Every 8 hours 04/05/18 2335 04/06/18 1139   04/05/18 1930  ciprofloxacin (CIPRO) IVPB 400 mg     400 mg 200 mL/hr over 60 Minutes Intravenous  Once 04/05/18 1925 04/05/18 2217   04/05/18 1930  metroNIDAZOLE (FLAGYL) IVPB 500 mg     500 mg 100 mL/hr over 60 Minutes Intravenous  Once 04/05/18 1925 04/05/18 2102       Assessment/Plan HLD 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 - on plavix (last dose 10/27) H/o CVA  Morbidly obese Chronic pain RUE DVT -was onIV heparin, now treatment dose lovenox  Acute perforated sigmoid diverticulitis with multiple abscesses - CT scan 11/5 revealsat least 3 fluid collectionsin the lower abdomen/pelvis with previous perforated diverticulitis; fluid collections are7.3 x 5.8 cm in left lower quadrant, air-fluid collection measuring 8.2 x 5.9 cm in the right lower quadrant, and8.9 x 5.3 cm fluid collection in the pelvis in the pre rectal space which appears to contain stool and fluid -s/pIR perc drains x 2 - 04/16/18, culture with multiple organisms present/none predominant - CT scan 11/12 showed resolution of large pelvic abscess, minimal residual mesenteric abscess although this fluid appears to communicate with a second collection in the mesentery in the LEFT mid abdomen adjacent to a small bowel loop measuring 4.4 x 3.5 cm, and persistent large extraluminal gas collection in the RIGHT pelvis 8.2 x 5.0 x 6.8 cm. -S/p3rdIR drain 11/13, culturegrowingFEW CANDIDA TROPICALIS, report pending - CT scan 11/17 showed 3 drainage catheters with no significant surrounding fluid, but there are2 remaining fluid collections/abscessesin the pelvis with the largest4.8 x 3.7 cm S/p Exploratory laparotomy, extensive lysis of adhesions, small bowel  resection, loop ileostomy, drainage of multiple intra-abdominal abscesses, placement of wound VAC - POD 3 - path pending - midline vac MWF - single IR drain remains, and 2 JP drains placed intraoperatively - awaiting bowel function  ID -currently merrem 10/27>>, flagyl 11/16>>11/17 VTE -SCDs,lovenox FEN -IVF,NPO, NGT to LIWS, TPN. Prealbumin 7.3 (11/18) Foley - d/c yesterday  Plan- NG clamping trials. Mobilize.    LOS: 63 days    Clovis Riley , [facility] 05/03/2018, 9:53 AM

## 2018-05-04 LAB — BASIC METABOLIC PANEL
ANION GAP: 10 (ref 5–15)
BUN: 45 mg/dL — ABNORMAL HIGH (ref 8–23)
CALCIUM: 8 mg/dL — AB (ref 8.9–10.3)
CO2: 33 mmol/L — ABNORMAL HIGH (ref 22–32)
Chloride: 96 mmol/L — ABNORMAL LOW (ref 98–111)
Creatinine, Ser: 0.91 mg/dL (ref 0.44–1.00)
GFR calc Af Amer: >60 mL/min (ref >60)
GLUCOSE: 199 mg/dL — AB (ref 70–99)
Potassium: 3.3 mmol/L — ABNORMAL LOW (ref 3.5–5.1)
Sodium: 139 mmol/L (ref 135–145)

## 2018-05-04 LAB — CBC
HCT: 27.7 % — ABNORMAL LOW (ref 36.0–46.0)
Hemoglobin: 8.7 g/dL — ABNORMAL LOW (ref 12.0–15.0)
MCH: 28.6 pg (ref 26.0–34.0)
MCHC: 31.4 g/dL (ref 30.0–36.0)
MCV: 91.1 fL (ref 80.0–100.0)
NRBC: 0.2 % (ref 0.0–0.2)
PLATELETS: 285 10*3/uL (ref 150–400)
RBC: 3.04 MIL/uL — ABNORMAL LOW (ref 3.87–5.11)
RDW: 17.1 % — AB (ref 11.5–15.5)
WBC: 12.3 10*3/uL — AB (ref 4.0–10.5)

## 2018-05-04 LAB — GLUCOSE, CAPILLARY
GLUCOSE-CAPILLARY: 139 mg/dL — AB (ref 70–99)
GLUCOSE-CAPILLARY: 170 mg/dL — AB (ref 70–99)
GLUCOSE-CAPILLARY: 200 mg/dL — AB (ref 70–99)
Glucose-Capillary: 190 mg/dL — ABNORMAL HIGH (ref 70–99)
Glucose-Capillary: 259 mg/dL — ABNORMAL HIGH (ref 70–99)
Glucose-Capillary: 175 mg/dL — ABNORMAL HIGH (ref 70–99)

## 2018-05-04 MED ORDER — POTASSIUM CHLORIDE CRYS ER 20 MEQ PO TBCR: 40.0000 meq | EXTENDED_RELEASE_TABLET | Freq: Once | ORAL | Status: DC

## 2018-05-04 MED ORDER — TRAVASOL 10 % IV SOLN
INTRAVENOUS | Status: AC
  Administered 2018-05-04: 18:00:00 via INTRAVENOUS
  Filled 2018-05-04: qty 960

## 2018-05-04 MED ORDER — POTASSIUM CHLORIDE 10 MEQ/100ML IV SOLN
INTRAVENOUS | Status: AC
  Filled 2018-05-04: qty 100

## 2018-05-04 MED ORDER — POTASSIUM CHLORIDE 10 MEQ/100ML IV SOLN
10.0000 meq | INTRAVENOUS | Status: AC
  Administered 2018-05-04 (×2): 10 meq via INTRAVENOUS
  Filled 2018-05-04: qty 100

## 2018-05-04 NOTE — Progress Notes (Signed)
Patient ID: [name], female   DOB: March 08, 1946, 72 y.o.   MRN: [id number]    4 Days Post-Op  Subjective: Pt not moving much.  No definitive nausea with NGT clamped.  No residual.    Objective: Vital signs in last 24 hours: Temp:  [97.8 F (36.6 C)-98.1 F (36.7 C)] 97.8 F (36.6 C) (11/24 0504) Pulse Rate:  [76-115] 108 (11/24 0504) Resp:  [18-20] 20 (11/24 0504) BP: (101-150)/(56-138) 101/76 (11/24 0504) SpO2:  [96 %-97 %] 97 % (11/24 0504) Last BM Date: 05/03/18  Intake/Output from previous day: 11/23 0701 - 11/24 0700 In: 2162.9 [I.V.:1616.4; IV Piggyback:546.5] Out: 4400 [Urine:4400] Intake/Output this shift: No intake/output data recorded.  PE: Abd: no NGT residual.  Midline wound with VAC in place, minimal output, some BS, ileostomy with old bloody output and air.  Both JP drains with serosang output.  Lab Results:  Recent Labs    05/03/18 0333 05/04/18 0348  WBC 14.6* 12.3*  HGB 8.8* 8.7*  HCT 27.7* 27.7*  PLT 261 285   BMET Recent Labs    05/03/18 0333 05/04/18 0348  NA 138 139  K 3.6 3.3*  CL 95* 96*  CO2 33* 33*  GLUCOSE 138* 199*  BUN 52* 45*  CREATININE 0.91 0.91  CALCIUM 8.3* 8.0*   PT/INR No results for input(s): LABPROT, INR in the last 72 hours. CMP     Component Value Date/Time   NA 139 05/04/2018 0348   K 3.3 (L) 05/04/2018 0348   CL 96 (L) 05/04/2018 0348   CO2 33 (H) 05/04/2018 0348   GLUCOSE 199 (H) 05/04/2018 0348   BUN 45 (H) 05/04/2018 0348   CREATININE 0.91 05/04/2018 0348   CALCIUM 8.0 (L) 05/04/2018 0348   PROT 5.1 (L) 05/01/2018 0510   ALBUMIN 1.8 (L) 05/01/2018 0510   AST 36 05/01/2018 0510   ALT 21 05/01/2018 0510   ALKPHOS 70 05/01/2018 0510   BILITOT 0.5 05/01/2018 0510   GFRNONAA >60 05/04/2018 0348   GFRAA >60 05/04/2018 0348   Lipase  No results found for: LIPASE     Studies/Results: No results found.  Anti-infectives: Anti-infectives (From admission, onward)   Start     Dose/Rate Route  Frequency Ordered Stop   05/01/18 1600  meropenem (MERREM) 1 g in sodium chloride 0.9 % 100 mL IVPB     1 g 200 mL/hr over 30 Minutes Intravenous Every 8 hours 05/01/18 1539     04/30/18 1215  fluconazole (DIFLUCAN) IVPB 400 mg  Status:  Discontinued     400 mg 100 mL/hr over 120 Minutes Intravenous To Surgery 04/30/18 1200 05/01/18 1215   04/29/18 1100  fluconazole (DIFLUCAN) IVPB 400 mg     400 mg 100 mL/hr over 120 Minutes Intravenous Every 24 hours 04/29/18 1006     04/27/18 1500  meropenem (MERREM) 1 g in sodium chloride 0.9 % 100 mL IVPB  Status:  Discontinued     1 g 200 mL/hr over 30 Minutes Intravenous Every 12 hours 04/27/18 1008 05/01/18 1539   04/26/18 1300  metroNIDAZOLE (FLAGYL) IVPB 500 mg  Status:  Discontinued     500 mg 100 mL/hr over 60 Minutes Intravenous Every 6 hours 04/26/18 1257 04/27/18 1041   04/15/18 1700  meropenem (MERREM) 1 g in sodium chloride 0.9 % 100 mL IVPB  Status:  Discontinued     1 g 200 mL/hr over 30 Minutes Intravenous Every 8 hours 04/15/18 1534 04/27/18 1008   04/09/18 2200  meropenem (MERREM) 1 g in sodium chloride 0.9 % 100 mL IVPB  Status:  Discontinued     1 g 200 mL/hr over 30 Minutes Intravenous Every 8 hours 04/09/18 1244 04/14/18 0944   04/06/18 1300  meropenem (MERREM) 1 g in sodium chloride 0.9 % 100 mL IVPB  Status:  Discontinued     1 g 200 mL/hr over 30 Minutes Intravenous Every 12 hours 04/06/18 1145 04/09/18 1244   04/06/18 1145  meropenem (MERREM) 1 g in sodium chloride 0.9 % 100 mL IVPB  Status:  Discontinued     1 g 200 mL/hr over 30 Minutes Intravenous Every 8 hours 04/06/18 1139 04/06/18 1145   04/06/18 0900  ciprofloxacin (CIPRO) IVPB 400 mg  Status:  Discontinued     400 mg 200 mL/hr over 60 Minutes Intravenous Every 12 hours 04/06/18 0732 04/06/18 1139   04/06/18 0300  metroNIDAZOLE (FLAGYL) IVPB 500 mg  Status:  Discontinued     500 mg 100 mL/hr over 60 Minutes Intravenous Every 8 hours 04/05/18 2335 04/06/18 1139    04/05/18 1930  ciprofloxacin (CIPRO) IVPB 400 mg     400 mg 200 mL/hr over 60 Minutes Intravenous  Once 04/05/18 1925 04/05/18 2217   04/05/18 1930  metroNIDAZOLE (FLAGYL) IVPB 500 mg     500 mg 100 mL/hr over 60 Minutes Intravenous  Once 04/05/18 1925 04/05/18 2102       Assessment/Plan HLD 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 - on plavix (last dose 10/27) H/o CVA  Morbidly obese Chronic pain RUE DVT -was onIV heparin, now treatment dose lovenox  Acute perforated sigmoid diverticulitis with multiple abscesses -s/pIR perc drains x 2 - 04/16/18, culture with multiple organisms present/none predominant -S/p3rdIR drain 11/13, culturegrowingFEW CANDIDA TROPICALIS,  S/p Exploratory laparotomy, extensive lysis of adhesions, small bowel resection, loop ileostomy, drainage of multiple intra-abdominal abscesses, placement of wound VAC - POD 4 - path pending, negative - midline vac MWF - single IR drain remains, and 2 JP drains placed intraoperatively - DC NGT, start clear liquids -patient needs to mobilize -some old bloody output in ileostomy.  hgb stable.  If trends down and output continues, may need to hold lovenox  ID -currently merrem 10/27>>, flagyl 11/16>>11/17 VTE -SCDs,lovenox FEN -IVF,CLD, TPN. Prealbumin 7.3 (11/18) Foley - d/c yesterday  Plan- CLD, mobilize!   LOS: 27 days    Henreitta Cea , [facility] 05/04/2018, 11:17 AM Pager: [phone]

## 2018-05-04 NOTE — Progress Notes (Signed)
Pharmacy Antibiotic and Anticoagulation Note   [name] is a 72 y.o. female admitted on 04/05/2018 with Sepsis with pneumoperitoneum secondary to worsening perforated sigmoid diverticulitis with multiple abdominal fluid collections and multiple abscesses.  Pharmacy dosing Merrem dosing.    Sepsis with pneumoperitoneum 2/2 worsening perforated sigmoid diverticulitis with multiple abdominal fluid collections and multiple abscesses. -s/p  surgery 11/20  small bowel resection, loop ileostomy and placement of wound VAC. -single IR drain remains, 2 JP drains placed - awaiting bowel fxn WBC 22.2>14.6 >12.3, afeb.  Leukocytosis 11/21 most likely reactive after surgery AKI with CKD stage III: AKI has resolved, SCr  wnl stable 0.91 Abscess cultures noted which are growing Candida, Clostridium, currently on  meropenem and Diflucan IV.  She remains NPO, on NGT ILS given postoperative ileus,continue bowel rest, continue with TPN.   Cipro 10/26 >> 10/27 Flagyl 10/26 >> 10/27 Merrem 10/27>>11/4,  11/5 >> Fluconazole 11/19>>   (canadida in RLQ abscess Cx)    11/19 MRSA PCR: neg 11/18 Cdiff PCR antigen positive,  toxin negative 11/18 Cdiff toxin PCR: neg 11/13 RLQ abscess CX >> reincubated , few canadida tropicalis pending 11/6: Pelvic abscess >> ngtd , holding for possible anaerobe, peptostreptococcus, and clostridium/Final 11/6: Pelvic abscess (x2) >> multiple organisms>>none predominant, no anerobes isolated-Final 10/28 MRSA PCR >> neg 10/26 Bcx >> negative   Anticoagulation:  Pharmacy also dosing Lovenox 1mg /kg sq q12h for acute RUE DVT from PICC.  SCr 0.91 stable, CrCl 71 ml/min.  Chronic anemia--- hgb 9.6 sp 2u prbc's on 11/20, >8.8,>8.7 pltc wnl. No bleeding reported.  MD noted RUE still swollen but improving there is no warmth or redness.   NPO on bowel rest. F/u plan for transition to PO anticoagulation when taking po's well.   Plan: Continue Meropenem  1g IV every 8 hours F/u Cx  updates and LOT, renal function Continue Lovenox 120 mg sq q12h CBC q72 hr  F/u plan for transition to PO anticoagulation when taking po's well.   Height: 5' 5.5" (166.4 cm) Weight: 260 lb 12.9 oz (118.3 kg) IBW/kg (Calculated) : 58.15  Temp (24hrs), Avg:97.9 F (36.6 C), Min:97.8 F (36.6 C), Max:98.1 F (36.7 C)  Recent Labs  Lab 05/01/18 0510 05/01/18 0852 05/02/18 0505 05/03/18 0333 05/04/18 0348  WBC 21.0* 20.8* 22.2* 14.6* 12.3*  CREATININE 1.16* 1.02* 1.06* 0.91 0.91    Estimated Creatinine Clearance: 71.4 mL/min (by C-G formula based on SCr of 0.91 mg/dL).    Allergies  Allergen Reactions  . Lactose Intolerance (Gi) Other (See Comments)    G.I. Upset  . Butrans [Buprenorphine] Rash and Other (See Comments)    Infected skin underneath application  . Penicillins Rash    Facial rash Has patient had a PCN reaction causing immediate rash, facial/tongue/throat swelling, SOB or lightheadedness with hypotension: Yes Has patient had a PCN reaction causing severe rash involving mucus membranes or skin necrosis: No Has patient had a PCN reaction that required hospitalization No Has patient had a PCN reaction occurring within the last 10 years: Yes If all of the above answers are "NO", then may proceed with Cephalosporin use.   . Simvastatin Rash    Nicole Cella, RPh Clinical Pharmacist Please check AMION for all [place] numbers 05/04/2018 12:52 PM

## 2018-05-04 NOTE — Progress Notes (Signed)
PHARMACY - ADULT TOTAL PARENTERAL NUTRITION CONSULT NOTE   Pharmacy Consult for TPN Indication: perforated diverticulitis  Patient Measurements: Height: 5' 5.5" (166.4 cm) Weight: 260 lb 12.9 oz (118.3 kg) IBW/kg (Calculated) : 58.15 TPN AdjBW (KG): 75.1 Body mass index is 42.74 kg/m.  Assessment: Admitted 10/26 with acute sigmoid diverticulitis without perf or abscess.   PMH: HTN, HLD, CKD3, RA, DM, CAD, CVA, CRI, fibromyalgia,morbid obesity, chronic pain, RUE DVT  GI: Acute perforated sigmoid diverticulitis with multiple abscesses. Patient has continued to have inadequate oral intake since admission. Soft diet with poor intake. Complains of abdominal pain and suppressed appetite w/ some nausea. Pre-albumin 6.6>7.3. LBM 11/16. Returned to OR on 11/20 for abscess drainage and wound vac placement, Drain output 434ml. - po PPI  11/5: at least 3 fluid collectionsin the lower abdomen/pelvis with previous perforated diverticulitis; 11/12 CT: resolution of large pelvic abscess, minimal residual mesenteric abscess although this fluid appears to communicate with a second collection in the mesentery in the LEFT mid abdomen adjacent to a small bowel loop, and persistent large extraluminal gas collection in the RIGHT pelvis-place additional 3rd drain.  Endo: A1C 5.9. (glipizide PTA)  DM, CBGs back up 170 - 200, was controlled 11/23 Insulin requirements in the past 24 hours: 17 units  Lytes: K 3.3 despite adding K back to TPN yesterday, 2 K runs ordered per MD  Renal:CKD3. Scr 1.06>0.91 (BL ~ 1.07). UOP 1.72mL/kg/hr Pulm: [place] 4L Cards: BP soft - on IV Lasix, metoprolol, Pravastatin Hepatobil: LFTs normalized  Neuro: RA, fibromyalgia, h/o CVA. Scheduled tylenol, Lexapro, Lyrica DZ:HGDJME from perforated sigmoid diverticulitis on Merrem, Flagyl and fluconazole, WBC 21>14.6, afebrile  Abscess from 11/13 growing few candida tropicalis  Anticoag: RUE PICC DVT on LMWH   TPN Access: removed 11/6 for  DVT,  new 04/23/18 TPN start date:04/23/18 Nutritional Goals (per RD recommendation on 04/29/18): Kcal:  1800-2000 Protein:  90-105 grams Fluid:  1.8-2.0 L  Goal TPN rate is 80 ml/hr   Current Nutrition: NPO, new TPN 11/13.   Plan:  Continue TPN at goal rate of 80 mL/hr TPN (at goal 80 ml/hr) provides 96 g of protein, 268 g of dextrose, and 57 g of lipids which provides 1873 kCals per day, meeting 100% of patient needs  Electrolytes in TPN: Max Cl, inc K MVI, trace elements, famotidine to TPN  SSI (moderate) - May add to TPN if requirements increase and CBGs remain uncontrolled TPN labs Monday  Bertis Ruddy, PharmD Clinical Pharmacist Please check AMION for all [place] numbers 05/04/2018 7:45 AM

## 2018-05-04 NOTE — Progress Notes (Signed)
PROGRESS NOTE    [name]  VHQ:[id number] DOB: 12/06/1945 DOA: 04/05/2018 PCP: [name], MD    Brief Narrative:   72 year old female with past medical history of hypertension, type 2 diabetes mellitus chronic kidney disease rheumatoid arthritis coronary artery disease S/P CABG on Plavix who was admitted with abdominal pain and found to have sigmoid diverticulitis on April 05, 2018 with perforation and abscess.  Initially was felt to be improving on IV Merrem but she continued to have abdominal pain. CT scan of the abdomen and pelvis was obtained April 15, 2018 and was significant for worsening diverticulitis with abdominal fluid collections, she was transferred to [facility] for IR evaluation, he did receive total of 3 percutaneous drains, with repeat CT abdomen pelvis 04/27/2018, still showing remaining fluid collection/abscess despite he strains, and appropriate antibiotic coverage, patient went for exploratory laparotomy with lysis of adhesion, small bowel resection with loop ileostomy, and placement of wound VAC by Dr. Georgette Dover 04/30/2018.  SUBJECTIVE:  Patient in bed, appears comfortable, denies any headache, no fever, no chest pain or pressure, no shortness of breath , +ve abdominal pain. No focal weakness.  Assessment & Plan:    Perforated sigmoid diverticulitis/abdominal abscess : General surgery following, initially managed conservatively with multiple abdominal drain placements however repeat CT scan on 04/27/2018 showed worsening of abscess and disease process after which she was taken to the OR for laparotomy with lysis of adhesion, small bowel resection, loop ileostomy and placement of wound VAC by Dr. Georgette Dover on 04/30/2018.  Abscess cultures noted which are growing Candida, Clostridium, currently on  meropenem and Diflucan IV.  Continue bowel rest, NG tube in place, IV TNA continued.  Clinically gradually improving.  Acute right upper extremity DVT extremity  -  due to PICC  line this was just discontinued April 16, 2018 still swollen but improving there is no warmth or redness.  She was initially on heparin now she is on Lovenox.  Acute kidney injury with chronic kidney disease stage III  -recent creatinine of 1.2, ARF resolved currently close to baseline.  Fibromyalgia - continue Lyrica able to take oral  Hypertension improved.  -She is n.p.o., continue with scheduled IV metoprolol and PRN hydralazine  Debility. -   PT has been consulted they recommend SNF.   Acute illness required anemia on chronic anemia  - she required total of 4 units PRBC transfusion during hospital stay .monitor closely and transfuse as needed .  Depression patient recently started on Lexapro  Paroxysmal atrial tachycardia.  She has not had any more episodes of PAT since that of April 22, 2018 echocardiogram that was done during showed preserved ejection fraction her beta-blocker was increased  Hypokalemia/hypophosphatemia - replaced monitor  Type 2 diabetes mellitus controlled  -Continue with insulin sliding scale every 4 hours as she is on TPN, CBG acceptable .  CBG (last 3)  Recent Labs    05/04/18 0457 05/04/18 0752 05/04/18 1202  GLUCAP 200* 190* 259*     DVT prophylaxis: Subcutaneous Lovenox treatment dose for right upper extremity DVT  code Status: Full Family Communication: family at bedside  05/04/18 Disposition Plan: Physical therapy recommended SNF Consultants:   General surgery  IR  Procedures: - Echocardiogram -   Left ventricle: The cavity size was normal. Wall thickness was increased in a pattern of moderate LVH. Systolic function was  vigorous. The estimated ejection fraction was in the range of 65% to 70%. Indeterminant diastolic function. Wall motion was normal; there were  no regional wall motion abnormalities. - Aortic valve: Valve area (VTI): 2.74 cm^2. Valve area (Vmax):   2.58 cm^2. Valve area (Vmean): 2.56 cm^2.  -S/PIR perc drains x 2 -  04/16/18, culture with multiple organisms present/none predominant -S/P3rdIR drain 11/13, culturegrowingFEW CANDIDA TROPICALIS, - Right arm PICC line - had DVT and was remioved -  Left arm PICC line - Exploratory laparotomy with lysis of adhesion, small bowel resection with loop ileostomy, and placement of wound VAC by Dr. Georgette Dover 04/30/2018.   Objective: Vitals:   05/03/18 1633 05/03/18 2009 05/04/18 0004 05/04/18 0504  BP: 127/72 (!) 143/56 (!) 150/138 101/76  Pulse: (!) 115 76 (!) 110 (!) 108  Resp: 20 18  20   Temp: 97.9 F (36.6 C) 98.1 F (36.7 C)  97.8 F (36.6 C)  TempSrc: Oral Oral    SpO2: 97% 96%  97%  Weight:      Height:        Intake/Output Summary (Last 24 hours) at 05/04/2018 1213 Last data filed at 05/04/2018 0509 Gross per 24 hour  Intake 1521.74 ml  Output 2200 ml  Net -678.26 ml   Filed Weights   04/19/18 0500 04/23/18 0500 04/24/18 0500  Weight: 123 kg 118.2 kg 118.3 kg    Examination:  Awake Alert, Oriented X 3, No new F.N deficits, Normal affect [place].AT,PERRAL Supple Neck,No JVD, No cervical lymphadenopathy appriciated.  Symmetrical Chest wall movement, Good air movement bilaterally, CTAB RRR,No Gallops, Rubs or new Murmurs, No Parasternal Heave +ve B.Sounds, midline surgical wound VAC, right ileostomyileostomy bag, left JP drain with sanguinous material .  NG tube in place, No Cyanosis, Clubbing or edema, No new Rash or bruise   Data Reviewed: I have personally reviewed following labs and imaging studies  CBC: Recent Labs  Lab 04/28/18 0305  05/01/18 0510 05/01/18 0852 05/02/18 0505 05/03/18 0333 05/04/18 0348  WBC 10.2   < > 21.0* 20.8* 22.2* 14.6* 12.3*  NEUTROABS 7.7  --   --   --   --   --   --   HGB 7.7*   < > 10.7* 10.1* 9.6* 8.8* 8.7*  HCT 24.9*   < > 34.0* 32.9* 31.1* 27.7* 27.7*  MCV 93.6   < > 92.9 94.0 94.5 92.6 91.1  PLT 348   < > 294 311 314 261 285   < > = values in this interval not displayed.   Basic Metabolic  Panel: Recent Labs  Lab 04/28/18 0305 04/29/18 0324  05/01/18 0510 05/01/18 0852 05/02/18 0505 05/03/18 0333 05/04/18 0348  NA 137 136   < > 138 138 140 138 139  K 3.3* 3.6   < > 5.6* 5.4* 4.4 3.6 3.3*  CL 95* 94*   < > 102 102 100 95* 96*  CO2 34* 33*   < > 29 29 32 33* 33*  GLUCOSE 142* 130*   < > 257* 245* 177* 138* 199*  BUN 44* 49*   < > 47* 47* 52* 52* 45*  CREATININE 1.42* 1.41*   < > 1.16* 1.02* 1.06* 0.91 0.91  CALCIUM 8.2* 8.4*   < > 8.3* 8.2* 8.5* 8.3* 8.0*  MG 1.6* 1.9  --  1.6*  --  1.9  --   --   PHOS 3.9  --   --  3.9  --   --   --   --    < > = values in this interval not displayed.   GFR: Estimated Creatinine Clearance: 71.4 mL/min (  by C-G formula based on SCr of 0.91 mg/dL). Liver Function Tests: Recent Labs  Lab 04/28/18 0305 05/01/18 0510  AST 30 36  ALT 17 21  ALKPHOS 54 70  BILITOT 0.4 0.5  PROT 4.5* 5.1*  ALBUMIN 1.7* 1.8*   No results for input(s): LIPASE, AMYLASE in the last 168 hours. No results for input(s): AMMONIA in the last 168 hours. Coagulation Profile: No results for input(s): INR, PROTIME in the last 168 hours. Cardiac Enzymes: No results for input(s): CKTOTAL, CKMB, CKMBINDEX, TROPONINI in the last 168 hours. BNP (last 3 results) No results for input(s): PROBNP in the last 8760 hours. HbA1C: No results for input(s): HGBA1C in the last 72 hours. CBG: Recent Labs  Lab 05/03/18 2006 05/04/18 0003 05/04/18 0457 05/04/18 0752 05/04/18 1202  GLUCAP 147* 170* 200* 190* 259*   Lipid Profile: No results for input(s): CHOL, HDL, LDLCALC, TRIG, CHOLHDL, LDLDIRECT in the last 72 hours. Thyroid Function Tests: No results for input(s): TSH, T4TOTAL, FREET4, T3FREE, THYROIDAB in the last 72 hours. Anemia Panel: No results for input(s): VITAMINB12, FOLATE, FERRITIN, TIBC, IRON, RETICCTPCT in the last 72 hours. Urine analysis:    Component Value Date/Time   COLORURINE YELLOW 04/05/2018 1924   APPEARANCEUR CLEAR 04/05/2018 1924    LABSPEC 1.021 04/05/2018 1924   PHURINE 5.0 04/05/2018 1924   GLUCOSEU NEGATIVE 04/05/2018 1924   HGBUR NEGATIVE 04/05/2018 1924   BILIRUBINUR NEGATIVE 04/05/2018 [place] NEGATIVE 04/05/2018 1924   PROTEINUR NEGATIVE 04/05/2018 1924   UROBILINOGEN 0.2 06/20/2013 1832   NITRITE NEGATIVE 04/05/2018 1924   LEUKOCYTESUR NEGATIVE 04/05/2018 1924   Sepsis Labs: @LABRCNTIP (procalcitonin:4,lacticidven:4)  ) Recent Results (from the past 240 hour(s))  C difficile quick scan w PCR reflex     Status: Abnormal   Collection Time: 04/28/18  9:01 PM  Result Value Ref Range Status   C Diff antigen POSITIVE (A) NEGATIVE Final   C Diff toxin NEGATIVE NEGATIVE Final   C Diff interpretation Results are indeterminate. See PCR results.  Final    Comment: Performed at [facility], [place] [address], [place], [place] [address]  C. Diff by PCR, Reflexed     Status: None   Collection Time: 04/28/18  9:01 PM  Result Value Ref Range Status   Toxigenic C. Difficile by PCR NEGATIVE NEGATIVE Final    Comment: Patient is colonized with non toxigenic C. difficile. May not need treatment unless significant symptoms are present. Performed at [facility], [place] [address], [place], [place] [address]   Surgical pcr screen     Status: None   Collection Time: 04/29/18  6:05 PM  Result Value Ref Range Status   MRSA, PCR NEGATIVE NEGATIVE Final   Staphylococcus aureus NEGATIVE NEGATIVE Final    Comment: (NOTE) The Xpert SA Assay (FDA approved for NASAL specimens in patients 42 years of age and older), is one component of a comprehensive surveillance program. It is not intended to diagnose infection nor to guide or monitor treatment. Performed at [facility], [place] [address], [place], [place] [address]      Radiology Studies: No results found.   Scheduled Meds: . sodium chloride   Intravenous Once  . Chlorhexidine Gluconate Cloth  6 each Topical Daily  . enoxaparin (LOVENOX)  injection  120 mg Subcutaneous Q12H  . furosemide  40 mg Intravenous Daily  . insulin aspart  0-15 Units Subcutaneous Q4H  . latanoprost  1 drop Both Eyes QHS  .  mouth rinse  15 mL Mouth Rinse BID  . metoprolol tartrate  5 mg Intravenous Q6H  . nystatin   Topical TID  . pregabalin  75 mg Oral QHS  . sodium chloride flush  5 mL Intracatheter Q8H  . timolol  1 drop Both Eyes BID   Continuous Infusions: . sodium chloride Stopped (05/03/18 0616)  . fluconazole (DIFLUCAN) IV Stopped (05/03/18 1200)  . meropenem (MERREM) IV 1 g (05/04/18 0527)  . methocarbamol (ROBAXIN) IV 500 mg (05/03/18 2357)  . TPN ADULT (ION) 80 mL/hr at 05/03/18 1800  . TPN ADULT (ION)     Anti-infectives (From admission, onward)   Start     Dose/Rate Route Frequency Ordered Stop   05/01/18 1600  meropenem (MERREM) 1 g in sodium chloride 0.9 % 100 mL IVPB     1 g 200 mL/hr over 30 Minutes Intravenous Every 8 hours 05/01/18 1539     04/30/18 1215  fluconazole (DIFLUCAN) IVPB 400 mg  Status:  Discontinued     400 mg 100 mL/hr over 120 Minutes Intravenous To Surgery 04/30/18 1200 05/01/18 1215   04/29/18 1100  fluconazole (DIFLUCAN) IVPB 400 mg     400 mg 100 mL/hr over 120 Minutes Intravenous Every 24 hours 04/29/18 1006     04/27/18 1500  meropenem (MERREM) 1 g in sodium chloride 0.9 % 100 mL IVPB  Status:  Discontinued     1 g 200 mL/hr over 30 Minutes Intravenous Every 12 hours 04/27/18 1008 05/01/18 1539   04/26/18 1300  metroNIDAZOLE (FLAGYL) IVPB 500 mg  Status:  Discontinued     500 mg 100 mL/hr over 60 Minutes Intravenous Every 6 hours 04/26/18 1257 04/27/18 1041   04/15/18 1700  meropenem (MERREM) 1 g in sodium chloride 0.9 % 100 mL IVPB  Status:  Discontinued     1 g 200 mL/hr over 30 Minutes Intravenous Every 8 hours 04/15/18 1534 04/27/18 1008   04/09/18 2200  meropenem (MERREM) 1 g in sodium chloride 0.9 % 100 mL IVPB  Status:  Discontinued     1 g 200 mL/hr over 30 Minutes Intravenous Every 8  hours 04/09/18 1244 04/14/18 0944   04/06/18 1300  meropenem (MERREM) 1 g in sodium chloride 0.9 % 100 mL IVPB  Status:  Discontinued     1 g 200 mL/hr over 30 Minutes Intravenous Every 12 hours 04/06/18 1145 04/09/18 1244   04/06/18 1145  meropenem (MERREM) 1 g in sodium chloride 0.9 % 100 mL IVPB  Status:  Discontinued     1 g 200 mL/hr over 30 Minutes Intravenous Every 8 hours 04/06/18 1139 04/06/18 1145   04/06/18 0900  ciprofloxacin (CIPRO) IVPB 400 mg  Status:  Discontinued     400 mg 200 mL/hr over 60 Minutes Intravenous Every 12 hours 04/06/18 0732 04/06/18 1139   04/06/18 0300  metroNIDAZOLE (FLAGYL) IVPB 500 mg  Status:  Discontinued     500 mg 100 mL/hr over 60 Minutes Intravenous Every 8 hours 04/05/18 2335 04/06/18 1139   04/05/18 1930  ciprofloxacin (CIPRO) IVPB 400 mg     400 mg 200 mL/hr over 60 Minutes Intravenous  Once 04/05/18 1925 04/05/18 2217   04/05/18 1930  metroNIDAZOLE (FLAGYL) IVPB 500 mg     500 mg 100 mL/hr over 60 Minutes Intravenous  Once 04/05/18 1925 04/05/18 2102      LOS: 27 days   Signature  Lala Lund M.D on 05/04/2018 at 12:13 PM   -  To  page go to [email or web address] - password [facility]

## 2018-05-05 LAB — DIFFERENTIAL
Abs Immature Granulocytes: 0.14 10*3/uL — ABNORMAL HIGH (ref 0.00–0.07)
BASOS ABS: 0.1 10*3/uL (ref 0.0–0.1)
BASOS PCT: 1 %
EOS ABS: 0.4 10*3/uL (ref 0.0–0.5)
EOS PCT: 3 %
IMMATURE GRANULOCYTES: 1 %
Lymphocytes Relative: 5 %
Lymphs Abs: 0.7 10*3/uL (ref 0.7–4.0)
Monocytes Absolute: 1.2 10*3/uL — ABNORMAL HIGH (ref 0.1–1.0)
Monocytes Relative: 9 %
Neutro Abs: 10.7 10*3/uL — ABNORMAL HIGH (ref 1.7–7.7)
Neutrophils Relative %: 81 %

## 2018-05-05 LAB — VITAMIN B12: VITAMIN B 12: 466 pg/mL (ref 180–914)

## 2018-05-05 LAB — COMPREHENSIVE METABOLIC PANEL
ALT: 47 U/L — ABNORMAL HIGH (ref 0–44)
ANION GAP: 7 (ref 5–15)
AST: 64 U/L — ABNORMAL HIGH (ref 15–41)
Albumin: 1.7 g/dL — ABNORMAL LOW (ref 3.5–5.0)
Alkaline Phosphatase: 93 U/L (ref 38–126)
BUN: 40 mg/dL — ABNORMAL HIGH (ref 8–23)
CHLORIDE: 102 mmol/L (ref 98–111)
CO2: 33 mmol/L — ABNORMAL HIGH (ref 22–32)
Calcium: 7.8 mg/dL — ABNORMAL LOW (ref 8.9–10.3)
Creatinine, Ser: 0.88 mg/dL (ref 0.44–1.00)
GFR calc Af Amer: >60 mL/min (ref >60)
GLUCOSE: 200 mg/dL — AB (ref 70–99)
POTASSIUM: 3.6 mmol/L (ref 3.5–5.1)
SODIUM: 142 mmol/L (ref 135–145)
Total Bilirubin: 0.4 mg/dL (ref 0.3–1.2)
Total Protein: 4.9 g/dL — ABNORMAL LOW (ref 6.5–8.1)

## 2018-05-05 LAB — CBC
HCT: 24.8 % — ABNORMAL LOW (ref 36.0–46.0)
Hemoglobin: 8 g/dL — ABNORMAL LOW (ref 12.0–15.0)
MCH: 29.5 pg (ref 26.0–34.0)
MCHC: 32.3 g/dL (ref 30.0–36.0)
MCV: 91.5 fL (ref 80.0–100.0)
NRBC: 0.6 % — AB (ref 0.0–0.2)
PLATELETS: 262 10*3/uL (ref 150–400)
RBC: 2.71 MIL/uL — ABNORMAL LOW (ref 3.87–5.11)
RDW: 17.2 % — AB (ref 11.5–15.5)
WBC: 13.1 10*3/uL — AB (ref 4.0–10.5)

## 2018-05-05 LAB — GLUCOSE, CAPILLARY
GLUCOSE-CAPILLARY: 186 mg/dL — AB (ref 70–99)
GLUCOSE-CAPILLARY: 188 mg/dL — AB (ref 70–99)
Glucose-Capillary: 163 mg/dL — ABNORMAL HIGH (ref 70–99)
Glucose-Capillary: 163 mg/dL — ABNORMAL HIGH (ref 70–99)
Glucose-Capillary: 185 mg/dL — ABNORMAL HIGH (ref 70–99)
Glucose-Capillary: 213 mg/dL — ABNORMAL HIGH (ref 70–99)

## 2018-05-05 LAB — RETICULOCYTES
IMMATURE RETIC FRACT: 37.8 % — AB (ref 2.3–15.9)
RBC.: 2.68 MIL/uL — ABNORMAL LOW (ref 3.87–5.11)
Retic Count, Absolute: 68.3 10*3/uL (ref 19.0–186.0)
Retic Ct Pct: 2.6 % (ref 0.4–3.1)

## 2018-05-05 LAB — IRON AND TIBC
IRON: 45 ug/dL (ref 28–170)
Saturation Ratios: 25 % (ref 10.4–31.8)
TIBC: 183 ug/dL — AB (ref 250–450)
UIBC: 138 ug/dL

## 2018-05-05 LAB — PHOSPHORUS: Phosphorus: 3.6 mg/dL (ref 2.5–4.6)

## 2018-05-05 LAB — PREALBUMIN: PREALBUMIN: 19.4 mg/dL (ref 18–38)

## 2018-05-05 LAB — TRIGLYCERIDES: TRIGLYCERIDES: 185 mg/dL — AB (ref <150)

## 2018-05-05 LAB — FERRITIN: Ferritin: 3581 ng/mL — ABNORMAL HIGH (ref 11–307)

## 2018-05-05 LAB — FOLATE: Folate: 6.9 ng/mL (ref >5.9)

## 2018-05-05 LAB — MAGNESIUM: MAGNESIUM: 1.4 mg/dL — AB (ref 1.7–2.4)

## 2018-05-05 MED ORDER — PRO-STAT SUGAR FREE PO LIQD
30.0000 mL | Freq: Three times a day (TID) | ORAL | Status: DC
  Administered 2018-05-06: 30 mL via ORAL
  Filled 2018-05-05 (×5): qty 30

## 2018-05-05 MED ORDER — ACETAMINOPHEN 325 MG PO TABS
650.0000 mg | ORAL_TABLET | Freq: Four times a day (QID) | ORAL | Status: DC
  Administered 2018-05-05 – 2018-05-06 (×4): 650 mg via ORAL
  Filled 2018-05-05 (×4): qty 2

## 2018-05-05 MED ORDER — SODIUM CHLORIDE 0.9 % IV SOLN
1.0000 g | Freq: Three times a day (TID) | INTRAVENOUS | Status: DC
  Administered 2018-05-05 – 2018-05-06 (×3): 1 g via INTRAVENOUS
  Filled 2018-05-05 (×4): qty 1

## 2018-05-05 MED ORDER — OXYCODONE HCL 5 MG PO TABS
5.0000 mg | ORAL_TABLET | ORAL | Status: DC | PRN
  Administered 2018-05-05: 5 mg via ORAL
  Administered 2018-05-05: 10 mg via ORAL
  Filled 2018-05-05: qty 1
  Filled 2018-05-05 (×2): qty 2

## 2018-05-05 MED ORDER — MAGNESIUM SULFATE 2 GM/50ML IV SOLN
2.0000 g | Freq: Once | INTRAVENOUS | Status: AC
  Administered 2018-05-05: 2 g via INTRAVENOUS
  Filled 2018-05-05: qty 50

## 2018-05-05 MED ORDER — CARVEDILOL 6.25 MG PO TABS
6.2500 mg | ORAL_TABLET | Freq: Two times a day (BID) | ORAL | Status: DC
  Administered 2018-05-05 – 2018-05-06 (×2): 6.25 mg via ORAL
  Filled 2018-05-05 (×4): qty 1

## 2018-05-05 MED ORDER — GUAIFENESIN ER 600 MG PO TB12
600.0000 mg | ORAL_TABLET | Freq: Two times a day (BID) | ORAL | Status: DC
  Administered 2018-05-05 – 2018-05-13 (×16): 600 mg via ORAL
  Filled 2018-05-05 (×17): qty 1

## 2018-05-05 MED ORDER — TRAVASOL 10 % IV SOLN
INTRAVENOUS | Status: DC
  Filled 2018-05-05: qty 480

## 2018-05-05 MED ORDER — METHOCARBAMOL 750 MG PO TABS
750.0000 mg | ORAL_TABLET | Freq: Four times a day (QID) | ORAL | Status: DC
  Administered 2018-05-05 (×4): 750 mg via ORAL
  Filled 2018-05-05 (×5): qty 1

## 2018-05-05 MED ORDER — ENSURE ENLIVE PO LIQD: 237.0000 mL | Freq: Two times a day (BID) | ORAL | Status: DC

## 2018-05-05 NOTE — Progress Notes (Signed)
[facility] Progress Note  5 Days Post-Op  Subjective: CC:  C/o abd pain worse with movement. Tolerating sips of clears. Tearful and states she "wants to die". Pulling 1500 cc on IS.  Objective: Vital signs in last 24 hours: Temp:  [98 F (36.7 C)-98.9 F (37.2 C)] 98.1 F (36.7 C) (11/25 0600) Pulse Rate:  [108-133] 108 (11/25 0600) Resp:  [12-20] 20 (11/25 0448) BP: (111-158)/(72-119) 141/78 (11/25 0600) SpO2:  [95 %-98 %] 98 % (11/25 0448) Last BM Date: 05/03/18  Intake/Output from previous day: 11/24 0701 - 11/25 0700 In: 1390.7 [I.V.:990.7; IV Piggyback:400] Out: 2270 [Urine:2100; Drains:170] Intake/Output this shift: No intake/output data recorded.  PE: Gen:  Alert, NAD, pleasant Abd: Soft, midline VAC in place  LUQ JP drain - 110cc/24h SS  LLQ JP - 10 cc/24h  SS  RLQ IR drain - 50 cc/24h SS   Ileostomy - with gas and dark, maroon stools in pouch  Skin: warm and dry, no rashes  Psych: A&Ox3   Lab Results:  Recent Labs    05/04/18 0348 05/05/18 0351  WBC 12.3* 13.1*  HGB 8.7* 8.0*  HCT 27.7* 24.8*  PLT 285 262   BMET Recent Labs    05/04/18 0348 05/05/18 0351  NA 139 142  K 3.3* 3.6  CL 96* 102  CO2 33* 33*  GLUCOSE 199* 200*  BUN 45* 40*  CREATININE 0.91 0.88  CALCIUM 8.0* 7.8*   PT/INR No results for input(s): LABPROT, INR in the last 72 hours. CMP     Component Value Date/Time   NA 142 05/05/2018 0351   K 3.6 05/05/2018 0351   CL 102 05/05/2018 0351   CO2 33 (H) 05/05/2018 0351   GLUCOSE 200 (H) 05/05/2018 0351   BUN 40 (H) 05/05/2018 0351   CREATININE 0.88 05/05/2018 0351   CALCIUM 7.8 (L) 05/05/2018 0351   PROT 4.9 (L) 05/05/2018 0351   ALBUMIN 1.7 (L) 05/05/2018 0351   AST 64 (H) 05/05/2018 0351   ALT 47 (H) 05/05/2018 0351   ALKPHOS 93 05/05/2018 0351   BILITOT 0.4 05/05/2018 0351   GFRNONAA >60 05/05/2018 0351   GFRAA >60 05/05/2018 0351   Lipase  No results found for: LIPASE     Studies/Results: No  results found.  Anti-infectives: Anti-infectives (From admission, onward)   Start     Dose/Rate Route Frequency Ordered Stop   05/01/18 1600  meropenem (MERREM) 1 g in sodium chloride 0.9 % 100 mL IVPB     1 g 200 mL/hr over 30 Minutes Intravenous Every 8 hours 05/01/18 1539     04/30/18 1215  fluconazole (DIFLUCAN) IVPB 400 mg  Status:  Discontinued     400 mg 100 mL/hr over 120 Minutes Intravenous To Surgery 04/30/18 1200 05/01/18 1215   04/29/18 1100  fluconazole (DIFLUCAN) IVPB 400 mg     400 mg 100 mL/hr over 120 Minutes Intravenous Every 24 hours 04/29/18 1006     04/27/18 1500  meropenem (MERREM) 1 g in sodium chloride 0.9 % 100 mL IVPB  Status:  Discontinued     1 g 200 mL/hr over 30 Minutes Intravenous Every 12 hours 04/27/18 1008 05/01/18 1539   04/26/18 1300  metroNIDAZOLE (FLAGYL) IVPB 500 mg  Status:  Discontinued     500 mg 100 mL/hr over 60 Minutes Intravenous Every 6 hours 04/26/18 1257 04/27/18 1041   04/15/18 1700  meropenem (MERREM) 1 g in sodium chloride 0.9 % 100 mL IVPB  Status:  Discontinued  1 g 200 mL/hr over 30 Minutes Intravenous Every 8 hours 04/15/18 1534 04/27/18 1008   04/09/18 2200  meropenem (MERREM) 1 g in sodium chloride 0.9 % 100 mL IVPB  Status:  Discontinued     1 g 200 mL/hr over 30 Minutes Intravenous Every 8 hours 04/09/18 1244 04/14/18 0944   04/06/18 1300  meropenem (MERREM) 1 g in sodium chloride 0.9 % 100 mL IVPB  Status:  Discontinued     1 g 200 mL/hr over 30 Minutes Intravenous Every 12 hours 04/06/18 1145 04/09/18 1244   04/06/18 1145  meropenem (MERREM) 1 g in sodium chloride 0.9 % 100 mL IVPB  Status:  Discontinued     1 g 200 mL/hr over 30 Minutes Intravenous Every 8 hours 04/06/18 1139 04/06/18 1145   04/06/18 0900  ciprofloxacin (CIPRO) IVPB 400 mg  Status:  Discontinued     400 mg 200 mL/hr over 60 Minutes Intravenous Every 12 hours 04/06/18 0732 04/06/18 1139   04/06/18 0300  metroNIDAZOLE (FLAGYL) IVPB 500 mg  Status:   Discontinued     500 mg 100 mL/hr over 60 Minutes Intravenous Every 8 hours 04/05/18 2335 04/06/18 1139   04/05/18 1930  ciprofloxacin (CIPRO) IVPB 400 mg     400 mg 200 mL/hr over 60 Minutes Intravenous  Once 04/05/18 1925 04/05/18 2217   04/05/18 1930  metroNIDAZOLE (FLAGYL) IVPB 500 mg     500 mg 100 mL/hr over 60 Minutes Intravenous  Once 04/05/18 1925 04/05/18 2102     Assessment/Plan HLD 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 - on plavix (last dose 10/27) H/o CVA  Morbidly obese Chronic pain RUE DVT -was onIV heparin, now treatment dose lovenox  Acute perforated sigmoid diverticulitis with multiple abscesses -s/pIR perc drains x 2 - 04/16/18, culture with multiple organisms present/none predominant -S/p3rdIR drain 11/13, culturegrowingFEW CANDIDA TROPICALIS  S/p Exploratory laparotomy, extensive lysis of adhesions, small bowel resection, loop ileostomy, drainage of multiple intra-abdominal abscesses, placement of wound VAC 04/30/18 Dr. Georgette Dover  - POD#5, path negative for malignancy  - midline vac MWF, will check wound today during VAC change  - single IR drain remains, and 2 JP drains placed intraoperatively - advance to FLD -patient needs to mobilize, increase PO pain control.  -some old bloody output in ileostomy.  hgb stable.  If trends down and output continues, may need to hold lovenox  ID -currently merrem 10/27>>, flagyl 11/16>>11/17; WBC 13.1 from 12.3   VTE -SCDs,lovenox FEN -IVF, FLD, start to wean TNA. Prealbumin 19.4 today, add ensure Foley - d/cyesterday  Plan-FLD, pain control, mobilize!   LOS: 28 days    Obie Dredge, [facility] Surgery Pager: [phone]

## 2018-05-05 NOTE — Progress Notes (Signed)
PROGRESS NOTE    [name]  WCB:[id number] DOB: 1946-06-04 DOA: 04/05/2018 PCP: [name], MD    Brief Narrative:   72 year old female with past medical history of hypertension, type 2 diabetes mellitus chronic kidney disease rheumatoid arthritis coronary artery disease S/P CABG on Plavix who was admitted with abdominal pain and found to have sigmoid diverticulitis on April 05, 2018 with perforation and abscess.  Initially was felt to be improving on IV Merrem but she continued to have abdominal pain. CT scan of the abdomen and pelvis was obtained April 15, 2018 and was significant for worsening diverticulitis with abdominal fluid collections, she was transferred to [facility] for IR evaluation, he did receive total of 3 percutaneous drains, with repeat CT abdomen pelvis 04/27/2018, still showing remaining fluid collection/abscess despite he strains, and appropriate antibiotic coverage, patient went for exploratory laparotomy with lysis of adhesion, small bowel resection with loop ileostomy, and placement of wound VAC by Dr. Georgette Dover 04/30/2018.  SUBJECTIVE:  Patient in bed, appears comfortable, denies any headache, no fever, no chest pain or pressure, no shortness of breath , no abdominal pain. No focal weakness.   Assessment & Plan:    Perforated sigmoid diverticulitis/abdominal abscess : General surgery following, initially managed conservatively with multiple abdominal drain placements however repeat CT scan on 04/27/2018 showed worsening of abscess and disease process after which she was taken to the OR for laparotomy with lysis of adhesion, small bowel resection, loop ileostomy and placement of wound VAC by Dr. Georgette Dover on 04/30/2018.  Abscess cultures noted which are growing Candida, Clostridium on with Peptostreptococcus.  She has been on meropenem since 04/06/2018 and will be switched to Unasyn on 05/05/2018, she has been on Diflucan IV since 04/29/2018.  Case discussed with ID  physician Dr. Johnnye Sima on 05/05/2018 goal is 4 weeks of total antibiotic along with 4 weeks of total antifungal.  Her bowel activity seems to be improving and she is tolerating clear liquid diet, on 05/05/2018 I will hold her TNA for 24 hours, added protein supplementation and monitor her oral intake.  If better will try to taper off TNA as soon as possible.   Acute right upper extremity DVT extremity  -  due to PICC line this was just discontinued April 16, 2018 still swollen but improving there is no warmth or redness.  She was initially on heparin now she is on Lovenox.  Anemia of critical illness.  Some element of dilution from TNA, check anemia panel, type screen and monitor.  Ileostomy stool is slightly dark in color and she is on full dose anticoagulation will monitor closely.    Acute kidney injury with chronic kidney disease stage III  -recent creatinine of 1.2, ARF resolved currently close to baseline.  Fibromyalgia - continue Lyrica able to take oral  Hypertension improved.  - Pressures high, will add oral Coreg, continue on PRN hydralazine and metoprolol IV.  Advanced weakness and deconditioning-   PT has been consulted they recommend SNF.  Patient encouraged to sit up in the chair rather than laying in bed.  Acute illness required anemia on chronic anemia  - she required total of 4 units PRBC transfusion during hospital stay .monitor closely and transfuse as needed .  Depression patient recently started on Lexapro  Paroxysmal atrial tachycardia.  She has not had any more episodes of PAT since that of April 22, 2018 echocardiogram that was done during showed preserved ejection fraction her beta-blocker was increased  Hypomagnesemia - replaced  monitor  Type 2 diabetes mellitus controlled  -Continue with insulin sliding scale every 4 hours as she is on TPN, CBG acceptable .  CBG (last 3)  Recent Labs    05/05/18 0020 05/05/18 0443 05/05/18 0815  GLUCAP 163* 185* 186*      DVT prophylaxis: Subcutaneous Lovenox treatment dose for right upper extremity DVT  code Status: Full Family Communication: family at bedside  05/04/18 Disposition Plan: Physical therapy recommended SNF Consultants:   General surgery  IR  ID - phone Dr Johnnye Sima 05/05/18  Procedures:  - Echocardiogram -   Left ventricle: The cavity size was normal. Wall thickness was increased in a pattern of moderate LVH. Systolic function was  vigorous. The estimated ejection fraction was in the range of 65% to 70%. Indeterminant diastolic function. Wall motion was normal; there were no regional wall motion abnormalities. - Aortic valve: Valve area (VTI): 2.74 cm^2. Valve area (Vmax):   2.58 cm^2. Valve area (Vmean): 2.56 cm^2.  -S/PIR perc drains x 2 - 04/16/18, culture with multiple organisms present/none predominant -S/P3rdIR drain 11/13, culturegrowingFEW CANDIDA TROPICALIS, - Right arm PICC line - had DVT and was remioved -  Left arm PICC line - Exploratory laparotomy with lysis of adhesion, small bowel resection with loop ileostomy, and placement of wound VAC by Dr. Georgette Dover 04/30/2018.   Objective: Vitals:   05/04/18 1337 05/04/18 2039 05/05/18 0448 05/05/18 0600  BP: (!) 149/119 111/72 (!) 158/73 (!) 141/78  Pulse: (!) 133 (!) 123 (!) 117 (!) 108  Resp: 20 12 20    Temp: 98.9 F (37.2 C) 98.3 F (36.8 C) 98 F (36.7 C) 98.1 F (36.7 C)  TempSrc: Oral Oral Oral Oral  SpO2: 95% 95% 98%   Weight:      Height:        Intake/Output Summary (Last 24 hours) at 05/05/2018 1117 Last data filed at 05/05/2018 1021 Gross per 24 hour  Intake 1390.7 ml  Output 3470 ml  Net -2079.3 ml   Filed Weights   04/19/18 0500 04/23/18 0500 04/24/18 0500  Weight: 123 kg 118.2 kg 118.3 kg    Examination:  Awake Alert, Oriented X 3, No new F.N deficits, Normal affect [place].AT,PERRAL Supple Neck,No JVD, No cervical lymphadenopathy appriciated.  Symmetrical Chest wall movement, Good air  movement bilaterally, CTAB RRR,No Gallops, Rubs or new Murmurs, No Parasternal Heave +ve B.Sounds, midline surgical wound VAC, right ileostomyileostomy bag, left JP drain with sanguinous material .  NG tube in place No Cyanosis, Clubbing or edema, No new Rash or bruise    Data Reviewed: I have personally reviewed following labs and imaging studies  CBC: Recent Labs  Lab 05/01/18 0852 05/02/18 0505 05/03/18 0333 05/04/18 0348 05/05/18 0351  WBC 20.8* 22.2* 14.6* 12.3* 13.1*  NEUTROABS  --   --   --   --  10.7*  HGB 10.1* 9.6* 8.8* 8.7* 8.0*  HCT 32.9* 31.1* 27.7* 27.7* 24.8*  MCV 94.0 94.5 92.6 91.1 91.5  PLT 311 314 261 285 798   Basic Metabolic Panel: Recent Labs  Lab 04/29/18 0324  05/01/18 0510 05/01/18 0852 05/02/18 0505 05/03/18 0333 05/04/18 0348 05/05/18 0351  NA 136   < > 138 138 140 138 139 142  K 3.6   < > 5.6* 5.4* 4.4 3.6 3.3* 3.6  CL 94*   < > 102 102 100 95* 96* 102  CO2 33*   < > 29 29 32 33* 33* 33*  GLUCOSE 130*   < > 257*  245* 177* 138* 199* 200*  BUN 49*   < > 47* 47* 52* 52* 45* 40*  CREATININE 1.41*   < > 1.16* 1.02* 1.06* 0.91 0.91 0.88  CALCIUM 8.4*   < > 8.3* 8.2* 8.5* 8.3* 8.0* 7.8*  MG 1.9  --  1.6*  --  1.9  --   --  1.4*  PHOS  --   --  3.9  --   --   --   --  3.6   < > = values in this interval not displayed.   GFR: Estimated Creatinine Clearance: 73.9 mL/min (by C-G formula based on SCr of 0.88 mg/dL). Liver Function Tests: Recent Labs  Lab 05/01/18 0510 05/05/18 0351  AST 36 64*  ALT 21 47*  ALKPHOS 70 93  BILITOT 0.5 0.4  PROT 5.1* 4.9*  ALBUMIN 1.8* 1.7*   No results for input(s): LIPASE, AMYLASE in the last 168 hours. No results for input(s): AMMONIA in the last 168 hours. Coagulation Profile: No results for input(s): INR, PROTIME in the last 168 hours. Cardiac Enzymes: No results for input(s): CKTOTAL, CKMB, CKMBINDEX, TROPONINI in the last 168 hours. BNP (last 3 results) No results for input(s): PROBNP in the last  8760 hours. HbA1C: No results for input(s): HGBA1C in the last 72 hours. CBG: Recent Labs  Lab 05/04/18 1718 05/04/18 2035 05/05/18 0020 05/05/18 0443 05/05/18 0815  GLUCAP 139* 175* 163* 185* 186*   Lipid Profile: Recent Labs    05/05/18 0351  TRIG 185*   Thyroid Function Tests: No results for input(s): TSH, T4TOTAL, FREET4, T3FREE, THYROIDAB in the last 72 hours. Anemia Panel: No results for input(s): VITAMINB12, FOLATE, FERRITIN, TIBC, IRON, RETICCTPCT in the last 72 hours. Urine analysis:    Component Value Date/Time   COLORURINE YELLOW 04/05/2018 1924   APPEARANCEUR CLEAR 04/05/2018 1924   LABSPEC 1.021 04/05/2018 1924   PHURINE 5.0 04/05/2018 1924   GLUCOSEU NEGATIVE 04/05/2018 1924   HGBUR NEGATIVE 04/05/2018 1924   BILIRUBINUR NEGATIVE 04/05/2018 [place] NEGATIVE 04/05/2018 1924   PROTEINUR NEGATIVE 04/05/2018 1924   UROBILINOGEN 0.2 06/20/2013 1832   NITRITE NEGATIVE 04/05/2018 1924   LEUKOCYTESUR NEGATIVE 04/05/2018 1924   Sepsis Labs: @LABRCNTIP (procalcitonin:4,lacticidven:4)  ) Recent Results (from the past 240 hour(s))  C difficile quick scan w PCR reflex     Status: Abnormal   Collection Time: 04/28/18  9:01 PM  Result Value Ref Range Status   C Diff antigen POSITIVE (A) NEGATIVE Final   C Diff toxin NEGATIVE NEGATIVE Final   C Diff interpretation Results are indeterminate. See PCR results.  Final    Comment: Performed at [facility], [place] [address], [place], [place] [address]  C. Diff by PCR, Reflexed     Status: None   Collection Time: 04/28/18  9:01 PM  Result Value Ref Range Status   Toxigenic C. Difficile by PCR NEGATIVE NEGATIVE Final    Comment: Patient is colonized with non toxigenic C. difficile. May not need treatment unless significant symptoms are present. Performed at [facility], [place] [address] [place], [place] [address]   Surgical pcr screen     Status: None   Collection Time: 04/29/18  6:05 PM  Result  Value Ref Range Status   MRSA, PCR NEGATIVE NEGATIVE Final   Staphylococcus aureus NEGATIVE NEGATIVE Final    Comment: (NOTE) The Xpert SA Assay (FDA approved for NASAL specimens in patients 91 years of age and older), is one component  of a comprehensive surveillance program. It is not intended to diagnose infection nor to guide or monitor treatment. Performed at [facility], [place] [address] [place], [place] [address]      Radiology Studies: No results found.   Scheduled Meds: . acetaminophen  650 mg Oral Q6H  . Chlorhexidine Gluconate Cloth  6 each Topical Daily  . enoxaparin (LOVENOX) injection  120 mg Subcutaneous Q12H  . feeding supplement (ENSURE ENLIVE)  237 mL Oral BID BM  . feeding supplement (PRO-STAT SUGAR FREE 64)  30 mL Oral TID WC  . furosemide  40 mg Intravenous Daily  . guaiFENesin  600 mg Oral BID  . insulin aspart  0-15 Units Subcutaneous Q4H  . latanoprost  1 drop Both Eyes QHS  . mouth rinse  15 mL Mouth Rinse BID  . methocarbamol  750 mg Oral QID  . metoprolol tartrate  5 mg Intravenous Q6H  . nystatin   Topical TID  . pregabalin  75 mg Oral QHS  . sodium chloride flush  5 mL Intracatheter Q8H  . timolol  1 drop Both Eyes BID   Continuous Infusions: . sodium chloride Stopped (05/03/18 0616)  . fluconazole (DIFLUCAN) IV 400 mg (05/04/18 1200)  . meropenem (MERREM) IV 1 g (05/05/18 0600)  . TPN ADULT (ION) 80 mL/hr at 05/04/18 1735   Anti-infectives (From admission, onward)   Start     Dose/Rate Route Frequency Ordered Stop   05/01/18 1600  meropenem (MERREM) 1 g in sodium chloride 0.9 % 100 mL IVPB     1 g 200 mL/hr over 30 Minutes Intravenous Every 8 hours 05/01/18 1539     04/30/18 1215  fluconazole (DIFLUCAN) IVPB 400 mg  Status:  Discontinued     400 mg 100 mL/hr over 120 Minutes Intravenous To Surgery 04/30/18 1200 05/01/18 1215   04/29/18 1100  fluconazole (DIFLUCAN) IVPB 400 mg     400 mg 100 mL/hr over 120 Minutes Intravenous Every  24 hours 04/29/18 1006     04/27/18 1500  meropenem (MERREM) 1 g in sodium chloride 0.9 % 100 mL IVPB  Status:  Discontinued     1 g 200 mL/hr over 30 Minutes Intravenous Every 12 hours 04/27/18 1008 05/01/18 1539   04/26/18 1300  metroNIDAZOLE (FLAGYL) IVPB 500 mg  Status:  Discontinued     500 mg 100 mL/hr over 60 Minutes Intravenous Every 6 hours 04/26/18 1257 04/27/18 1041   04/15/18 1700  meropenem (MERREM) 1 g in sodium chloride 0.9 % 100 mL IVPB  Status:  Discontinued     1 g 200 mL/hr over 30 Minutes Intravenous Every 8 hours 04/15/18 1534 04/27/18 1008   04/09/18 2200  meropenem (MERREM) 1 g in sodium chloride 0.9 % 100 mL IVPB  Status:  Discontinued     1 g 200 mL/hr over 30 Minutes Intravenous Every 8 hours 04/09/18 1244 04/14/18 0944   04/06/18 1300  meropenem (MERREM) 1 g in sodium chloride 0.9 % 100 mL IVPB  Status:  Discontinued     1 g 200 mL/hr over 30 Minutes Intravenous Every 12 hours 04/06/18 1145 04/09/18 1244   04/06/18 1145  meropenem (MERREM) 1 g in sodium chloride 0.9 % 100 mL IVPB  Status:  Discontinued     1 g 200 mL/hr over 30 Minutes Intravenous Every 8 hours 04/06/18 1139 04/06/18 1145   04/06/18 0900  ciprofloxacin (CIPRO) IVPB 400 mg  Status:  Discontinued     400 mg  200 mL/hr over 60 Minutes Intravenous Every 12 hours 04/06/18 0732 04/06/18 1139   04/06/18 0300  metroNIDAZOLE (FLAGYL) IVPB 500 mg  Status:  Discontinued     500 mg 100 mL/hr over 60 Minutes Intravenous Every 8 hours 04/05/18 2335 04/06/18 1139   04/05/18 1930  ciprofloxacin (CIPRO) IVPB 400 mg     400 mg 200 mL/hr over 60 Minutes Intravenous  Once 04/05/18 1925 04/05/18 2217   04/05/18 1930  metroNIDAZOLE (FLAGYL) IVPB 500 mg     500 mg 100 mL/hr over 60 Minutes Intravenous  Once 04/05/18 1925 04/05/18 2102      LOS: 28 days   Signature  Lala Lund M.D on 05/05/2018 at 11:17 AM   -  To page go to [email or web address] - password [facility]

## 2018-05-05 NOTE — Progress Notes (Addendum)
12pm-[facility] reports that they do not have any beds available this week. CSW will contact patient to select a different facility.   11:11am-CSW left voicemail for [facility] to make sure they are still able to take patient at discharge.  Percell Locus Dailan Pfalzgraf LCSW [phone]

## 2018-05-05 NOTE — Consult Note (Addendum)
.  [facility] Nurse wound follow up Wound type: surgical  Measurement: 21cm x 4cm x 7cm at the distal aspect of the wound bed and 4cm at the proximal end. CCS PA in the room to assess the wound with [facility] nurse Wound bed: pale, subcutaneous tissues some soupy material at the distal aspect. Noted to be deeper than foam was placed, will need adequate packing in this area to keep from fluid accumulation  Drainage (amount, consistency, odor) serosanguinous, mild odor but not concerned today  Periwound: intact Dressing procedure/placement/frequency: Removed old NPWT dressing Filled wound with 1______ pieces of black foam. Sealed NPWT dressing at 141mm HG/174mmHG  Patient received IV pain medication per bedside nurse prior to dressing change Patient tolerated procedure well  WOC nurse will continue to manage NPWT dressing due to the deep portion of the wound and concerns this will not be packed adequately    WOC Nurse ostomy follow up Stoma type/location: RLQ, end ileostomy Stomal assessment/size: 1 1/2 " round, slightly budded, some mucosal sloughing Peristomal assessment:  intact  Treatment options for stomal/peristomal skin: 2" barrier ring Output liquid, black, appears to have blood in the output and CCS is aware. Ostomy pouching: 1pc. Flexible convex with 2" barrier ring Family not available today, however her husband did watch he does not seem to be engaged to learn. Patient will not look and she covers her face and nose.    Education provided:  Risk of dehydration  High protein diet for wound healing. Scheduled meeting with patient's daughter for Wednesday at [place].  Enrolled patient in [facility] Discharge program: No  [name] [facility], [place], [place] .

## 2018-05-05 NOTE — Progress Notes (Addendum)
PHARMACY - ADULT TOTAL PARENTERAL NUTRITION CONSULT NOTE   Pharmacy Consult for TPN Indication: perforated diverticulitis  Patient Measurements: Height: 5' 5.5" (166.4 cm) Weight: 260 lb 12.9 oz (118.3 kg) IBW/kg (Calculated) : 58.15 TPN AdjBW (KG): 75.1 Body mass index is 42.74 kg/m.  Assessment: Admitted 10/26 with acute sigmoid diverticulitis without perf or abscess.   PMH: HTN, HLD, CKD3, RA, DM, CAD, CVA, CRI, fibromyalgia,morbid obesity, chronic pain, RUE DVT  11/16. Returned to OR on 11/20 for abscess drainage and wound vac placement 11/5: at least 3 fluid collectionsin the lower abdomen/pelvis with previous perforated diverticulitis 11/12 CT: resolution of large pelvic abscess, minimal residual mesenteric abscess; although fluid appears to communicate with second collection in the mesentery in LEFT mid abdomen adjacent to a small bowel loop, and persistent large extraluminal gas collection in the RIGHT pelvis -place additional 3rd drain  GI: Acute perforated sigmoid diverticulitis with multiple abscesses. Still with abdominal pain. Nausea - improved. NGT d/c'd 11/24, start clear liquids - tolerating. Advance to full liquids 11/25 + wean TPN. Pre-albumin 6.6>19.4. Single IR drain remains - output 170 ml/24hrs. LBM 11/23 Endo: A1C 5.9. (glipizide PTA). Hx DM, CBGs 163-200 Insulin requirements in the past 24 hours: 19 units Lytes: CL up to 102 / CO2 stable 33, K up to 3.6 (s/p 2 K runs), Mag low 1.4 (MD replacing this AM); others WNL Renal: CKD3. Scr 1.06>0.88 (BL~1.1), BUN down 40. UOP 0.7 mL/kg/hr Pulm: Drew 4L. Mucinex Cards: BP slightly high, HR tachy 100-130s - on IV Lasix 40 daily, metoprolol Hepatobil: LFTs trend up, now mildly elevated. TG 113>185 Neuro: RA, fibromyalgia, h/o CVA. Scheduled tylenol, Lyrica, robaxin, PRN morphine ID: Sepsis from perforated sigmoid diverticulitis on Merrem, Flagyl, and fluconazole, WBC up to 13.1, afebrile  Abscess from 11/13 growing few  candida tropicalis  Anticoag: RUE PICC DVT on LMWH. 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 TPN provides 48 g of protein, 135 g of dextrose, and 29 g of lipids which provides 937 kCals per day, meeting 53% of protein and 52% of patient kCal needs  Electrolytes in TPN: Continue max Cl, 11/24 K increase Add MVI, trace elements, famotidine 40mg  to TPN  Continue moderate SSI for now and monitor CBGs MD already ordered Mag sulfate 2g IV x 1 this AM Monitor TPN labs, toleration of diet advancement and ability to wean TPN  Elicia Lamp, PharmD, BCPS Clinical Pharmacist Clinical phone [phone] Please check AMION for all [place] contact numbers 05/05/2018 9:00 AM   ADDENDUM - Dr. Candiss Norse requests to hold TPN today to stimulate appetite. Will d/c order and follow for plans tomorrow.  Elicia Lamp, PharmD, BCPS Please check AMION for all [place] contact numbers Clinical Pharmacist 05/05/2018 11:11 AM

## 2018-05-05 NOTE — Progress Notes (Signed)
Contacted bedside nurse to arrange time with patient and her family for ostomy pouch change.  She is scheduled for NPWT dressing change as well today. Contacted CCM to assess wound with WOC nurse at that time. Supplies ordered.    Await call back from bedside nurse with daughter's contact information to set up teaching for today or for tomorrow.   [place], [place], [place]

## 2018-05-05 NOTE — Progress Notes (Signed)
Wound check:  See WOC RN Melody's note for complete wound measurements; no tunneling, no necrosis, wound is pale but no signs of necrosis or infection.    Obie Dredge, PA-C [facility] Pager: [phone]

## 2018-05-05 NOTE — Progress Notes (Addendum)
Nutrition Follow-up  DOCUMENTATION CODES:   Morbid obesity  INTERVENTION:   - TPN management per pharmacy  - Diet advancement per surgery  - d/c Ensure Enlive due to pt refusal  - Continue Pro-stat 30 ml TID, each supplement provides 100 kcals and 15 grams protein  - Family to bring Premier Protein oral nutrition supplements from home due to pt refusing all oral nutrition supplements offered, recommend consumption BID as each provides 160 kcal and 30 grams of protein  NUTRITION DIAGNOSIS:   Inadequate oral intake related to inability to eat as evidenced by energy intake < or equal to 50% for > or equal to 5 days (NPO/clear liquid status >5 days).  Ongoing  GOAL:   Patient will meet greater than or equal to 90% of their needs  Met via TPN  MONITOR:   PO intake, Diet advancement, Labs  REASON FOR ASSESSMENT:   Consult New TPN/TNA  ASSESSMENT:   72 year old female with PMH significant for CAD, HLD/HTN, CVA, T2DM, RA, and fibromyalgia/chronic pain. Pt presented w/ 2 weeks of lower abdominal pain. Had recently been dx w/ UTI and palced on abx, but pain continued. Initial CT scan showed diverticulitis w/o perf, but pt developed hypotension and repeat CT did show perforation. Surgery consulted.  10/26-10/31 - NPO 10/31 - advanced to clear liquid diet 11/1 - advanced to full liquid diet 11/5 - advanced to soft diet(had 2 meals) 11/6 - NPO per surgery for bowel rest, s/p IR placement of 2 abdominal abscess drains 11/7-11/11 - clear liquid diet 11/12 - CT scan showing resolution of large pelvic abscess, minimal residual mesenteric abscess, s/p additional drain placement 11/13 - s/p PICC placement, TPN initiated at 40 ml/hr, , CT showing right lower quadrant abdomen air/fluid collection, s/p IR placement of third abscess drain 11/14 - advanced to clear liquid diet 11/15 - advanced to full liquid diet 11/16 - advanced to soft diet, TPN increased to 60 ml/hr 11/17 - TPN  increased to goal rate of 80 ml/hr, CT showing resolving previous abscesses but 2 fluid collections/abscesses present 11/19 - back to clear liquid diet 11/20 - s/p ex-lap, extensive LOA, small bowel resection, loop ileostomy, drainage of multiple abscesses, and wound VAC 11/23 - NG clamping trials 11/24 - NG d/c, started clear liquid trials 11/25 - diet advanced to full liquids, TPN decreased to 40 ml/hr  Spoke with pt, husband, and RN at bedside. Pt's husband with multiple questions regarding full liquid diet order. Explained diet order to pt and husband. Pt's husband to bring pt a smoothie or milkshake from outside restaurant due to pt preference.  Pt refuses Ensure Enlive, Glucerna, and Boost Breeze oral nutrition supplements and reports that the only supplement she will drink is Engineer, civil (consulting). As [facility] no longer stocks Premier Protein, encouraged pt to have son or husband bring it from home. Recommend pt consume 2 Premier Protein shakes daily. Will also try Pro-stat BID. Discussed with MD.  At end of visit, pt complaining of difficulty swallowing pills due to "throat spasms." RN in room at end of RD visit.  Weights have been variable throughout admission. Admission weight was 255 lbs, current weight is 260 lbs. Will continue to monitor weight trends.  Current TPN: 40 ml/hr which provides 937 kcal, 48 grams of protein, 135 grams of dextrose, and 29 grams of lipids (meets 53% of protein needs, 52% of kcal needs)  However, noted TPN to be held today per MD to stimulate appetite.  Medications reviewed and include: Ensure  Enlive BID (pt refusing), Lasix 40 mg daily, SSI, IV antibiotics, Pro-stat BID, TPN  Labs reviewed: BUN 40 (H), magnesium 1.4 (L), triglycerides 185 (H) CBG's: 186, 185, 163, 175, 139 x 24 hours  UOP: 2100 ml x 24 hours Left abdominal JP drain: 110 ml x 24 hours Left lateral JP drain: 10 ml x 24 hours Right lateral drain: 50 ml x 24 hours I/O's: +10.8 L since  admission  NUTRITION - FOCUSED PHYSICAL EXAM:  Repeat NFPE shows mild subcutaneous fat wasting, mild muscle wasting, and moderate generalize edema.    Most Recent Value  Orbital Region  No depletion  Upper Arm Region  No depletion  Thoracic and Lumbar Region  Unable to assess  Buccal Region  Mild depletion  Temple Region  Mild depletion  Clavicle Bone Region  No depletion  Clavicle and Acromion Bone Region  Mild depletion  Scapular Bone Region  Unable to assess  Dorsal Hand  No depletion  Patellar Region  Mild depletion  Anterior Thigh Region  Mild depletion  Posterior Calf Region  Mild depletion  Edema (RD Assessment)  Moderate [generalized]  Hair  Reviewed  Eyes  Reviewed  Mouth  Reviewed  Skin  Reviewed  Nails  Reviewed       Diet Order:   Diet Order            Diet full liquid Room service appropriate? Yes; Fluid consistency: Thin  Diet effective now              EDUCATION NEEDS:   Education needs have been addressed  Skin:  Skin Assessment: Skin Integrity Issues: Wound VAC: abdomen Incisions: abdomen  Last BM:  11/23 (ileostomy)  Height:   Ht Readings from Last 1 Encounters:  04/16/18 5' 5.5" (1.664 m)    Weight:   Wt Readings from Last 1 Encounters:  04/24/18 118.3 kg    Ideal Body Weight:  56.82 kg  BMI:  Body mass index is 42.74 kg/m.  Estimated Nutritional Needs:   Kcal:  1800-2000  Protein:  90-105 grams  Fluid:  1.8-2.9 L    Gaynell Face, MS, RD, LDN Inpatient Clinical Dietitian Pager: [phone] Weekend/After Hours: [phone]

## 2018-05-05 NOTE — NC FL2 (Signed)
[place] LEVEL OF CARE SCREENING TOOL     IDENTIFICATION  Patient Name: [name] Birthdate: 03-24-1946 Sex: female Admission Date (Current Location): 04/05/2018  [facility] and [place] Number:  [employer] and Address:  [facility] [place], [place] [address] [place], [place] [address]      Provider Number: [id number]  Attending Physician Name and Address:  Thurnell Lose, MD  Relative Name and Phone Number:  Orland Mustard (husband) [phone]    Current Level of Care: Hospital Recommended Level of Care: [place] Prior Approval Number:    Date Approved/Denied:   PASRR Number: [id number]  Discharge Plan: SNF    Current Diagnoses: Patient Active Problem List   Diagnosis Date Noted  . Acute renal failure superimposed on stage 3 chronic kidney disease ([place]) 04/08/2018  . Sepsis due to undetermined organism ([place]) 04/06/2018  . CKD (chronic kidney disease) stage 3, GFR 30-59 ml/min (HCC) 04/06/2018  . Fibromyalgia 04/06/2018  . Diverticulitis of large intestine with perforation and abscess 04/05/2018  . Anemia 04/05/2018  . Renal insufficiency 04/05/2018  . Abnormal abdominal CT scan 02/11/2018  . Esophageal dysphagia 01/03/2018  . AKI (acute kidney injury) ([place]) 06/27/2016  . Rheumatoid arthritis ([place]) 06/27/2016  . Carpal tunnel syndrome 03/24/2014  . Paresthesias/numbness 03/24/2014  . Obesity, unspecified 09/23/2013  . Pulmonary nodules 07/03/2013  . TIA (transient ischemic attack) 06/20/2013  . Left arm weakness 06/20/2013  . DM (diabetes mellitus) ([place]) 06/20/2013  . HTN (hypertension) 06/20/2013  . Chronic pain 06/20/2013  . Vitreous hemorrhage ([place]) 03/25/2012    Orientation RESPIRATION BLADDER Height & Weight     Self, Time, Situation, Place  O2(Nasal cannula 4L) Continent, External catheter Weight: 118.3 kg Height:  5' 5.5" (166.4 cm)  BEHAVIORAL SYMPTOMS/MOOD NEUROLOGICAL BOWEL  NUTRITION STATUS      Ileostomy, Continent Diet(Please see DC Summary (will be off of TPN and NG tube))  AMBULATORY STATUS COMMUNICATION OF NEEDS Skin   Extensive Assist Verbally Surgical wounds, Wound Vac(Closed incision on abdomen with wound vac)                       Personal Care Assistance Level of Assistance  Bathing, Feeding, Dressing Bathing Assistance: Limited assistance Feeding assistance: Independent Dressing Assistance: Limited assistance     Functional Limitations Info  Sight, Hearing, Speech Sight Info: Impaired Hearing Info: Impaired Speech Info: Adequate    SPECIAL CARE FACTORS FREQUENCY  PT (By licensed PT), OT (By licensed OT)     PT Frequency: 5x/week OT Frequency: 3x/week            Contractures Contractures Info: Not present    Additional Factors Info  Code Status, Allergies, Insulin Sliding Scale Code Status Info: Full Allergies Info: Allergies:  Lactose Intolerance (Gi), Butrans Buprenorphine, Penicillins, Simvastatin   Insulin Sliding Scale Info: Every 4 hours       Current Medications (05/05/2018):  This is the current hospital active medication list Current Facility-Administered Medications  Medication Dose Route Frequency Provider Last Rate Last Dose  . 0.9 %  sodium chloride infusion   Intravenous PRN Elgergawy, Silver Huguenin, MD   Stopped at 05/03/18 [phone]  . acetaminophen (TYLENOL) tablet 650 mg  650 mg Oral Q6H Jill Alexanders, PA-C   650 mg at 05/05/18 1119  . albuterol (PROVENTIL) (2.5 MG/3ML) 0.083% nebulizer solution 2.5 mg  2.5 mg Nebulization Q6H PRN Meuth, Brooke A, PA-C      .  carvedilol (COREG) tablet 6.25 mg  6.25 mg Oral BID WC Thurnell Lose, MD      . Chlorhexidine Gluconate Cloth 2 % PADS 6 each  6 each Topical Daily Meuth, Brooke A, PA-C   6 each at 05/05/18 [phone]  . enoxaparin (LOVENOX) injection 120 mg  120 mg Subcutaneous Q12H Skeet Simmer, RPH   120 mg at 05/05/18 2703  . feeding supplement (ENSURE ENLIVE)  (ENSURE ENLIVE) liquid 237 mL  237 mL Oral BID BM Simaan, Elizabeth S, PA-C      . feeding supplement (PRO-STAT SUGAR FREE 64) liquid 30 mL  30 mL Oral TID WC Thurnell Lose, MD      . fluconazole (DIFLUCAN) IVPB 400 mg  400 mg Intravenous Q24H Meuth, Brooke A, PA-C 100 mL/hr at 05/05/18 1124 400 mg at 05/05/18 1124  . furosemide (LASIX) injection 40 mg  40 mg Intravenous Daily Meuth, Brooke A, PA-C   40 mg at 05/05/18 5009  . guaiFENesin (MUCINEX) 12 hr tablet 600 mg  600 mg Oral BID Jill Alexanders, PA-C   600 mg at 05/05/18 3818  . hydrALAZINE (APRESOLINE) injection 10 mg  10 mg Intravenous Q6H PRN Meuth, Brooke A, PA-C      . insulin aspart (novoLOG) injection 0-15 Units  0-15 Units Subcutaneous Q4H Elgergawy, Silver Huguenin, MD   3 Units at 05/05/18 [phone]  . latanoprost (XALATAN) 0.005 % ophthalmic solution 1 drop  1 drop Both Eyes QHS Meuth, Brooke A, PA-C   1 drop at 05/04/18 2244  . MEDLINE mouth rinse  15 mL Mouth Rinse BID Elgergawy, Silver Huguenin, MD   15 mL at 05/05/18 0823  . meropenem (MERREM) 1 g in sodium chloride 0.9 % 100 mL IVPB  1 g Intravenous Q8H Elgergawy, Dawood S, MD 200 mL/hr at 05/05/18 0600 1 g at 05/05/18 0600  . methocarbamol (ROBAXIN) tablet 750 mg  750 mg Oral QID Jill Alexanders, PA-C   750 mg at 05/05/18 7169  . morphine 2 MG/ML injection 2-4 mg  2-4 mg Intravenous Q3H PRN Meuth, Brooke A, PA-C   2 mg at 05/05/18 1120  . nystatin (MYCOSTATIN/NYSTOP) topical powder   Topical TID Meuth, Brooke A, PA-C      . ondansetron (ZOFRAN) injection 4 mg  4 mg Intravenous Q6H PRN Meuth, Brooke A, PA-C   4 mg at 04/29/18 1136  . oxyCODONE (Oxy IR/ROXICODONE) immediate release tablet 5-10 mg  5-10 mg Oral Q4H PRN Simaan, Elizabeth S, PA-C      . phenol (CHLORASEPTIC) mouth spray 1 spray  1 spray Mouth/Throat PRN Meuth, Brooke A, PA-C      . pregabalin (LYRICA) capsule 75 mg  75 mg Oral QHS Meuth, Brooke A, PA-C   75 mg at 05/03/18 2135  . sodium chloride (OCEAN) 0.65 % nasal spray  1 spray  1 spray Each Nare PRN Thurnell Lose, MD      . sodium chloride flush (NS) 0.9 % injection 10-40 mL  10-40 mL Intracatheter PRN Meuth, Brooke A, PA-C   15 mL at 04/30/18 0649  . sodium chloride flush (NS) 0.9 % injection 5 mL  5 mL Intracatheter Q8H Meuth, Brooke A, PA-C   5 mL at 05/04/18 2251  . timolol (TIMOPTIC) 0.5 % ophthalmic solution 1 drop  1 drop Both Eyes BID Meuth, Brooke A, PA-C   1 drop at 05/05/18 0824  . TPN ADULT (ION)   Intravenous Continuous TPN Bertis Ruddy, RPH 80 mL/hr  at 05/04/18 1735       Discharge Medications: Please see discharge summary for a list of discharge medications.  Relevant Imaging Results:  Relevant Lab Results:   Additional Information SSN: [id number]  [place], LCSW

## 2018-05-05 NOTE — Progress Notes (Signed)
Physical Therapy Treatment Patient Details Name: [name] MRN: [id number] DOB: December 03, 1945 Today's Date: 05/05/2018    History of Present Illness [name]  is a 72 y.o. female, w hypertension, hyperlipidemia, dm2, pvd, CVA, OSA, Rheumatoid arthritis apparently c/o LLQ pain over the past several weeks, just started on abx yesterday, pt notes subjective fever as well as nausea.  Pt denies emesis, constipation, diarrhea, brbpr, black stool.  Pt presented due to worsening abdominal pain today. Found to have acute perforated sigmoid diverticulitis with multiple abscesses. Pt is s/p abdominal abscess drains X2.     PT Comments    Pt was in bed with husband at bedside upon arrival. Pt c/o of abdominal pain- RN notified. Pt participated in bed mobility and transfers via STEDY this session. Pt was able to participate more with bed mobility and sit to stand requiring Min A + 2 this session. Pt c/o SOB once in chair- O2 94% RA and instructed on pursed lip breathing. Pt continues to be limited by pain, but is making progress toward stated goals. Pt would benefit from continued PT in order to maximize functional independence and increase overall strength. Based on current level of function pt remains appropriate for SNF.    Follow Up Recommendations  SNF     Equipment Recommendations  None recommended by PT    Recommendations for Other Services       Precautions / Restrictions Precautions Precautions: Fall;Other (comment) Precaution Comments: 2 abscess drains Restrictions Weight Bearing Restrictions: No    Mobility  Bed Mobility Overal bed mobility: Needs Assistance Bed Mobility: Rolling;Sidelying to Sit Rolling: +2 for physical assistance;Min assist Sidelying to sit: +2 for physical assistance;Min assist;HOB elevated       General bed mobility comments: Pt required VC/TC to reach for bedrail instead of therapist. Pt was able to participate more this session with bed mobility,  requiring Min A + 2. Pt able to progress LE off EOB and was able to elevate trunk with Min A +2.  Transfers Overall transfer level: Needs assistance   Transfers: Sit to/from Stand Sit to Stand: Min assist;+2 physical assistance;From elevated surface         General transfer comment: Pt required VC for hand placement on STEDY.  Patient was able to boost into standing utilizing STEDY bar and with therapist providing Min A +2 through use of bed pad.  Patient required VC to reach for chair armrests prior to sitting to chair.  Ambulation/Gait                 Stairs             Wheelchair Mobility    Modified Rankin (Stroke Patients Only)       Balance Overall balance assessment: Needs assistance Sitting-balance support: Feet supported;Bilateral upper extremity supported Sitting balance-Leahy Scale: Fair Sitting balance - Comments: Patient required external assist from therapist to remain sitting upright on EOB with peroids of Min guard for safety.   Standing balance support: Bilateral upper extremity supported Standing balance-Leahy Scale: Poor Standing balance comment: pt stood in STEDY 30 seconds during transfer from bed to recliner chair.                            Cognition Arousal/Alertness: Awake/alert Behavior During Therapy: WFL for tasks assessed/performed Overall Cognitive Status: Within Functional Limits for tasks assessed  General Comments: Husband was present during session      Exercises Total Joint Exercises Ankle Circles/Pumps: AROM;15 reps;Right;Left;Supine Heel Slides: AROM;5 reps;Left;10 reps;Right;Supine(5 reps performed on R pt c/o of abdominal pain)    General Comments        Pertinent Vitals/Pain Faces Pain Scale: Hurts little more Pain Location: Abdomin Pain Descriptors / Indicators: Moaning;Discomfort;Grimacing;Constant Pain Intervention(s): Limited activity within  patient's tolerance;Monitored during session;Repositioned;RN gave pain meds during session;Premedicated before session    Home Living                      Prior Function            PT Goals (current goals can now be found in the care plan section) Acute Rehab PT Goals Patient Stated Goal: none stated PT Goal Formulation: With patient/family Time For Goal Achievement: 04/28/18 Potential to Achieve Goals: Fair Progress towards PT goals: Progressing toward goals    Frequency    Min 3X/week      PT Plan Current plan remains appropriate    Co-evaluation              AM-PAC PT "6 Clicks" Mobility   Outcome Measure  Help needed turning from your back to your side while in a flat bed without using bedrails?: A Lot Help needed moving from lying on your back to sitting on the side of a flat bed without using bedrails?: A Lot Help needed moving to and from a bed to a chair (including a wheelchair)?: A Lot Help needed standing up from a chair using your arms (e.g., wheelchair or bedside chair)?: A Lot Help needed to walk in hospital room?: Total Help needed climbing 3-5 steps with a railing? : Total 6 Click Score: 10    End of Session   Activity Tolerance: Patient limited by pain Patient left: in chair;with call bell/phone within reach;with family/visitor present;with chair alarm set Nurse Communication: Mobility status PT Visit Diagnosis: Unsteadiness on feet (R26.81);Muscle weakness (generalized) (M62.81)     Time: 3875-6433 PT Time Calculation (min) (ACUTE ONLY): 27 min  Charges:  $Therapeutic Activity: 23-37 mins                     [address], SPTA   [place] 05/05/2018, 4:18 PM

## 2018-05-06 LAB — CBC
HCT: 23.3 % — ABNORMAL LOW (ref 36.0–46.0)
Hemoglobin: 7.4 g/dL — ABNORMAL LOW (ref 12.0–15.0)
MCH: 29.5 pg (ref 26.0–34.0)
MCHC: 31.8 g/dL (ref 30.0–36.0)
MCV: 92.8 fL (ref 80.0–100.0)
PLATELETS: 254 10*3/uL (ref 150–400)
RBC: 2.51 MIL/uL — AB (ref 3.87–5.11)
RDW: 17.5 % — AB (ref 11.5–15.5)
WBC: 15 10*3/uL — ABNORMAL HIGH (ref 4.0–10.5)
nRBC: 1.5 % — ABNORMAL HIGH (ref 0.0–0.2)

## 2018-05-06 LAB — BASIC METABOLIC PANEL
Anion gap: 10 (ref 5–15)
BUN: 50 mg/dL — ABNORMAL HIGH (ref 8–23)
CHLORIDE: 102 mmol/L (ref 98–111)
CO2: 31 mmol/L (ref 22–32)
Calcium: 8.1 mg/dL — ABNORMAL LOW (ref 8.9–10.3)
Creatinine, Ser: 1.14 mg/dL — ABNORMAL HIGH (ref 0.44–1.00)
GFR calc Af Amer: 54 mL/min — ABNORMAL LOW (ref >60)
GFR, EST NON AFRICAN AMERICAN: 47 mL/min — AB (ref >60)
Glucose, Bld: 113 mg/dL — ABNORMAL HIGH (ref 70–99)
Potassium: 4 mmol/L (ref 3.5–5.1)
Sodium: 143 mmol/L (ref 135–145)

## 2018-05-06 LAB — GLUCOSE, CAPILLARY
GLUCOSE-CAPILLARY: 102 mg/dL — AB (ref 70–99)
GLUCOSE-CAPILLARY: 102 mg/dL — AB (ref 70–99)
GLUCOSE-CAPILLARY: 109 mg/dL — AB (ref 70–99)
GLUCOSE-CAPILLARY: 114 mg/dL — AB (ref 70–99)
GLUCOSE-CAPILLARY: 137 mg/dL — AB (ref 70–99)
Glucose-Capillary: 100 mg/dL — ABNORMAL HIGH (ref 70–99)
Glucose-Capillary: 170 mg/dL — ABNORMAL HIGH (ref 70–99)

## 2018-05-06 LAB — PHOSPHORUS: PHOSPHORUS: 5.7 mg/dL — AB (ref 2.5–4.6)

## 2018-05-06 LAB — MAGNESIUM: MAGNESIUM: 1.8 mg/dL (ref 1.7–2.4)

## 2018-05-06 MED ORDER — METHOCARBAMOL 500 MG PO TABS
1000.0000 mg | ORAL_TABLET | Freq: Four times a day (QID) | ORAL | Status: DC
  Administered 2018-05-06 – 2018-05-12 (×21): 1000 mg via ORAL
  Filled 2018-05-06 (×29): qty 2

## 2018-05-06 MED ORDER — INSULIN ASPART 100 UNIT/ML ~~LOC~~ SOLN: 0.0000 [IU] | Freq: Every day | SUBCUTANEOUS | Status: DC

## 2018-05-06 MED ORDER — METOPROLOL TARTRATE 25 MG PO TABS
25.0000 mg | ORAL_TABLET | Freq: Two times a day (BID) | ORAL | Status: DC
  Administered 2018-05-06 – 2018-05-07 (×3): 25 mg via ORAL
  Filled 2018-05-06 (×5): qty 1

## 2018-05-06 MED ORDER — FAMOTIDINE 20 MG PO TABS
20.0000 mg | ORAL_TABLET | Freq: Two times a day (BID) | ORAL | Status: DC
  Administered 2018-05-06 – 2018-05-13 (×15): 20 mg via ORAL
  Filled 2018-05-06 (×15): qty 1

## 2018-05-06 MED ORDER — HYDROCODONE-ACETAMINOPHEN 5-325 MG PO TABS
2.0000 | ORAL_TABLET | ORAL | Status: DC | PRN
  Administered 2018-05-06 – 2018-05-13 (×9): 2 via ORAL
  Filled 2018-05-06 (×9): qty 2

## 2018-05-06 MED ORDER — ACETAMINOPHEN 500 MG PO TABS
500.0000 mg | ORAL_TABLET | Freq: Three times a day (TID) | ORAL | Status: DC
  Administered 2018-05-06 – 2018-05-13 (×19): 500 mg via ORAL
  Filled 2018-05-06 (×20): qty 1

## 2018-05-06 MED ORDER — LACTATED RINGERS IV SOLN
INTRAVENOUS | Status: AC
  Administered 2018-05-06: 13:00:00 via INTRAVENOUS

## 2018-05-06 MED ORDER — MORPHINE SULFATE (PF) 2 MG/ML IV SOLN
2.0000 mg | INTRAVENOUS | Status: DC | PRN
  Administered 2018-05-11 – 2018-05-12 (×2): 2 mg via INTRAVENOUS
  Filled 2018-05-06 (×2): qty 1

## 2018-05-06 MED ORDER — INSULIN ASPART 100 UNIT/ML ~~LOC~~ SOLN
0.0000 [IU] | Freq: Three times a day (TID) | SUBCUTANEOUS | Status: DC
  Administered 2018-05-06: 2 [IU] via SUBCUTANEOUS
  Administered 2018-05-06 – 2018-05-07 (×4): 3 [IU] via SUBCUTANEOUS
  Administered 2018-05-08 – 2018-05-12 (×3): 2 [IU] via SUBCUTANEOUS

## 2018-05-06 NOTE — Progress Notes (Signed)
Referring Physician(s): Dr Ronnie Derby; Dr Sherrill Raring  Supervising Physician: Markus Daft  Patient Status:  [facility] - In-pt  Chief Complaint:  divertic abscesses IR has one drain in place-- RLQ  Subjective:  "not a good day" per pt Tired but less pain  IR has one RLQ drain in place Intact OP cloudy light brown: FEW CANDIDA TROPICALIS    Allergies: Lactose intolerance (gi); Butrans [buprenorphine]; Penicillins; and Simvastatin  Medications: Prior to Admission medications   Medication Sig Start Date End Date Taking? Authorizing Provider  Blood Glucose Monitoring Suppl (FREESTYLE LITE) DEVI See admin instructions. 12/22/14  Yes [provider]  ciprofloxacin (CIPRO) 500 MG/5ML (10%) suspension Take by mouth 2 (two) times daily.   Yes [provider]  clopidogrel (PLAVIX) 75 MG tablet Take 1 tablet (75 mg total) by mouth daily with breakfast. 01/14/18  Yes Rehman, Mechele Dawley, MD  Dexlansoprazole 30 MG capsule Take 40 mg by mouth daily.   Yes [provider]  diclofenac sodium (VOLTAREN) 1 % GEL Apply 2 g topically 4 (four) times daily as needed (Pain).    Yes [provider]  glipiZIDE (GLUCOTROL) 10 MG tablet Take 10 mg by mouth daily before breakfast.   Yes [provider]  HYDROcodone-acetaminophen (NORCO) 10-325 MG tablet Take 1 tablet by mouth 4 (four) times daily.   Yes [provider]  IRON PO Take 1 tablet by mouth daily.   Yes [provider]  isosorbide dinitrate (ISORDIL) 5 MG tablet Take 1 tablet (5 mg total) by mouth 3 (three) times daily before meals. 02/07/18  Yes Rehman, Mechele Dawley, MD  leflunomide (ARAVA) 20 MG tablet Take 20 mg by mouth daily.   Yes [provider]  metoprolol succinate (TOPROL-XL) 25 MG 24 hr tablet Take 25 mg by mouth daily.   Yes [provider]  metroNIDAZOLE (FLAGYL) 250 MG tablet Take 250 mg by mouth 3 (three) times daily.   Yes [provider]    Olmesartan-amLODIPine-HCTZ (TRIBENZOR) 40-5-25 MG TABS Take 1 tablet by mouth daily.    Yes [provider]  pravastatin (PRAVACHOL) 40 MG tablet Take 40 mg by mouth at bedtime.   Yes [provider]  predniSONE (DELTASONE) 5 MG tablet Take 5 mg by mouth daily with breakfast.   Yes [provider]  pregabalin (LYRICA) 75 MG capsule Take 75 mg by mouth 3 (three) times daily.   Yes [provider]  Tafluprost (ZIOPTAN) 0.0015 % SOLN Place 1 drop into both eyes at bedtime.    Yes [provider]  temazepam (RESTORIL) 30 MG capsule Take 30 mg by mouth at bedtime.   Yes [provider]  timolol (BETIMOL) 0.5 % ophthalmic solution Place 1 drop into both eyes 2 (two) times daily.    Yes [provider]  Tofacitinib Citrate (XELJANZ) 5 MG TABS Take 1 tablet by mouth daily.   Yes [provider]  VITAMIN D, ERGOCALCIFEROL, PO Take 50,000 Units by mouth once a week. Thursdays   Yes [provider]  docusate sodium (COLACE) 100 MG capsule Take 2 capsules (200 mg total) by mouth at bedtime. Patient taking differently: Take 200 mg by mouth as needed.  02/13/18   Rogene Houston, MD     Vital Signs: BP 110/63 (BP Location: Left Leg)   Pulse (!) 112   Temp 97.7 F (36.5 C)   Resp 19   Ht 5' 5.5" (1.664 m)   Wt 260 lb 12.9  oz (118.3 kg)   SpO2 96%   BMI 42.74 kg/m   Physical Exam  Abdominal: There is tenderness.  Skin: Skin is warm and dry.  Site of RLQ abscess drain is clean and dry NT no bleeding Flushes nicely- no pain    Vitals reviewed.   Imaging: No results found.  Labs:  CBC: Recent Labs    05/03/18 0333 05/04/18 0348 05/05/18 0351 05/06/18 0406  WBC 14.6* 12.3* 13.1* 15.0*  HGB 8.8* 8.7* 8.0* 7.4*  HCT 27.7* 27.7* 24.8* 23.3*  PLT 261 285 262 254    COAGS: Recent Labs    04/06/18 1952 04/23/18 0329  INR 1.22 1.25  APTT 33  --     BMP: Recent Labs    05/03/18 0333 05/04/18 0348  05/05/18 0351 05/06/18 0406  NA 138 139 142 143  K 3.6 3.3* 3.6 4.0  CL 95* 96* 102 102  CO2 33* 33* 33* 31  GLUCOSE 138* 199* 200* 113*  BUN 52* 45* 40* 50*  CALCIUM 8.3* 8.0* 7.8* 8.1*  CREATININE 0.91 0.91 0.88 1.14*  GFRNONAA >60 >60 >60 47*  GFRAA >60 >60 >60 54*    LIVER FUNCTION TESTS: Recent Labs    04/24/18 0833 04/28/18 0305 05/01/18 0510 05/05/18 0351  BILITOT 0.6 0.4 0.5 0.4  AST 55* 30 36 64*  ALT 28 17 21  47*  ALKPHOS 59 54 70 93  PROT 4.7* 4.5* 5.1* 4.9*  ALBUMIN 1.7* 1.7* 1.8* 1.7*    Assessment and Plan:  Diverticular abscesses RLQ still in place - only IR drain Will follow Plan per CCS  Electronically Signed: Lavonia Drafts, PA-C 05/06/2018, 8:40 AM   I spent a total of 15 Minutes at the the patient's bedside AND on the patient's hospital floor or unit, greater than 50% of which was counseling/coordinating care for RLQ abscess drain

## 2018-05-06 NOTE — Progress Notes (Signed)
Physical Therapy Treatment Patient Details Name: [name] MRN: [id number] DOB: 05/11/1946 Today's Date: 05/06/2018    History of Present Illness [name]  is a 72 y.o. female, w hypertension, hyperlipidemia, dm2, pvd, CVA, OSA, Rheumatoid arthritis apparently c/o LLQ pain over the past several weeks, just started on abx yesterday, pt notes subjective fever as well as nausea.  Pt denies emesis, constipation, diarrhea, brbpr, black stool.  Pt presented due to worsening abdominal pain today. Found to have acute perforated sigmoid diverticulitis with multiple abscesses. Pt is s/p abdominal abscess drains X2.     PT Comments    Pt in bed upon arrival. Pt stated she felt depressed because of her situation and reported that when therapy arrives she feels a "rush of pain". Pt agreeable to therapy this session. She participated in transfers and gait with a RW with session. Pt required Mod A +2 for sit<>stand this session. Upon standing pt reported dizziness; BP taken in sitting (128/80mmHg). Pt limited to 3-4 steps this session due to weakness, poor balance and dizziness. When patient became tired of standing she required Max A +2 to prevent LOB and to sit in the chair. Pt was able to stand for approximately 30 sec before losing balance. Pt was left in chair with all needs met, Wound VAC was beeping and displaying an error message- RN notified.  Pt is making progress toward goals and would benefit from continued PT in order to maximize functional independence. Pt remains appropriate for discharge to SNF based on current status.   Follow Up Recommendations  SNF     Equipment Recommendations  None recommended by PT    Recommendations for Other Services       Precautions / Restrictions Precautions Precautions: Fall;Other (comment) Precaution Comments: 2 abscess drains Restrictions Weight Bearing Restrictions: No    Mobility  Bed Mobility Overal bed mobility: Needs Assistance Bed  Mobility: Rolling;Sidelying to Sit Rolling: +2 for physical assistance;Min assist Sidelying to sit: +2 for physical assistance;HOB elevated;Mod assist       General bed mobility comments: Pt required VC/TC to reach for bedrail instead of therapist. Pt able to progress LE off EOB and was able to elevate trunk with Mod A +2.  Transfers Overall transfer level: Needs assistance Equipment used: Rolling walker (2 wheeled) Transfers: Sit to/from Stand Sit to Stand: Mod assist;+2 physical assistance;From elevated surface;Max assist         General transfer comment: Pt required VC for safe hand placement on RW and when sitting in chair. Pt required Mod A+2 to boost into standing from elevated surface.  Pt became unsteady and c/o of dizziness then requiring Max +2 to prevent LOB- pt was sat in chair. BP taken in sitting (128/80mmHg). Pt instructed in pursed lip breathing.  2 Attempts sit<>stand this session (from bed and then from chair).  Ambulation/Gait Ambulation/Gait assistance: Max assist;+2 physical assistance Gait Distance (Feet): (limited to 3-4 steps) Assistive device: Rolling walker (2 wheeled)       General Gait Details: Pt c/o of pain in LE when asked to take steps, she also reports dizziness upon standing. VC for foot placement and progressing feet to side sep toward chair. Pt became unsteady and c/o of dizziness then requiring Max +2 to prevent LOB- pt was sat in chair. BP taken in sitting (128/80mmHg).   Stairs             Wheelchair Mobility    Modified Rankin (Stroke Patients Only)         Balance Overall balance assessment: Needs assistance Sitting-balance support: Feet supported;Bilateral upper extremity supported Sitting balance-Leahy Scale: Fair Sitting balance - Comments: Patient required external assist from therapist to remain sitting upright on EOB with peroids of Min guard for safety.   Standing balance support: Bilateral upper extremity  supported Standing balance-Leahy Scale: Poor Standing balance comment: Pt reliant on RW and external assist                            Cognition Arousal/Alertness: Awake/alert Behavior During Therapy: WFL for tasks assessed/performed Overall Cognitive Status: Within Functional Limits for tasks assessed                                 General Comments: Husband was present during session      Exercises      General Comments        Pertinent Vitals/Pain Faces Pain Scale: Hurts little more Pain Location: Abdomen Pain Descriptors / Indicators: Moaning;Discomfort;Grimacing;Constant Pain Intervention(s): Limited activity within patient's tolerance;Monitored during session;Repositioned    Home Living                      Prior Function            PT Goals (current goals can now be found in the care plan section) Acute Rehab PT Goals Patient Stated Goal: none stated PT Goal Formulation: With patient/family Time For Goal Achievement: 04/28/18 Potential to Achieve Goals: Fair Progress towards PT goals: Progressing toward goals    Frequency    Min 3X/week      PT Plan Current plan remains appropriate    Co-evaluation              AM-PAC PT "6 Clicks" Mobility   Outcome Measure  Help needed turning from your back to your side while in a flat bed without using bedrails?: A Lot Help needed moving from lying on your back to sitting on the side of a flat bed without using bedrails?: A Lot Help needed moving to and from a bed to a chair (including a wheelchair)?: A Lot Help needed standing up from a chair using your arms (e.g., wheelchair or bedside chair)?: A Lot Help needed to walk in hospital room?: A Lot Help needed climbing 3-5 steps with a railing? : Total 6 Click Score: 11    End of Session Equipment Utilized During Treatment: Gait belt Activity Tolerance: Patient limited by pain;Patient limited by fatigue Patient left:  in chair;with call bell/phone within reach;with family/visitor present;with chair alarm set Nurse Communication: Mobility status PT Visit Diagnosis: Unsteadiness on feet (R26.81);Muscle weakness (generalized) (M62.81)     Time: 5462-7035 PT Time Calculation (min) (ACUTE ONLY): 24 min  Charges:  $Therapeutic Activity: 23-37 mins                    [address] SPTA   [place] 05/06/2018, 11:17 AM

## 2018-05-06 NOTE — Progress Notes (Signed)
[place], NP aware of patient heart rate in the 120s-130s. Evening dose of Coreg was held.  VSS. Care instruction orders placed to give evening dose of coreg. Corge given at [place]. Will continue to monitor and notify oncall of any changes.

## 2018-05-06 NOTE — Progress Notes (Signed)
PHARMACY - ADULT TOTAL PARENTERAL NUTRITION CONSULT NOTE   Pharmacy Consult for TPN Indication: perforated diverticulitis  Patient Measurements: Height: 5' 5.5" (166.4 cm) Weight: 260 lb 12.9 oz (118.3 kg) IBW/kg (Calculated) : 58.15 TPN AdjBW (KG): 75.1 Body mass index is 42.74 kg/m.  Assessment: Admitted 10/26 with acute sigmoid diverticulitis without perf or abscess.   PMH: HTN, HLD, CKD3, RA, DM, CAD, CVA, CRI, fibromyalgia,morbid obesity, chronic pain, RUE DVT  11/16. Returned to OR on 11/20 for abscess drainage and wound vac placement 11/5: at least 3 fluid collectionsin the lower abdomen/pelvis with previous perforated diverticulitis 11/12 CT: resolution of large pelvic abscess, minimal residual mesenteric abscess; although fluid appears to communicate with second collection in the mesentery in LEFT mid abdomen adjacent to a small bowel loop, and persistent large extraluminal gas collection in the RIGHT pelvis -place additional 3rd drain  GI: Acute perforated sigmoid diverticulitis with multiple abscesses. Still with abdominal pain. Nausea - improved. NGT d/c'd 11/24, start clear liquids - tolerating. Advance to full liquids 11/25 + wean TPN. Pre-albumin 6.6>19.4. Single IR drain remains - output 62.5 ml/24hrs. LBM 11/23 Endo: A1C 5.9. (glipizide PTA). Hx DM, CBGs 102-213 Insulin requirements in the past 24 hours: 14 units Lytes: CO2 normalized, Mag up to 1.8 (S/p Mag sulf 2g x 1); Phos up to 5.7; others WNL Renal: CKD3. Scr back up to 1.14 (BL~1.1), BUN up to 50. UOP 0.7 mL/kg/hr Pulm: [place] 4L. Mucinex Cards: BP soft, HR tachy 100-130s - on IV Lasix 40 daily, coreg Hepatobil: LFTs trend up, now mildly elevated. TG 113>185 Neuro: RA, fibromyalgia, h/o CVA. Scheduled tylenol, Lyrica, robaxin, PRN morphine, oxy ID: Sepsis from perforated sigmoid diverticulitis on Merrem, Flagyl, and fluconazole, WBC up to 15, afebrile. Goal 4wks of tx per ID recs Abscess from 11/13 growing few  candida tropicalis  Anticoag: RUE PICC DVT on LMWH. 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

## 2018-05-06 NOTE — Progress Notes (Signed)
PROGRESS NOTE    [name]  EUM:[id number] DOB: 03-17-1946 DOA: 04/05/2018 PCP: [name], MD    Brief Narrative:   72 year old female with past medical history of hypertension, type 2 diabetes mellitus chronic kidney disease rheumatoid arthritis coronary artery disease S/P CABG on Plavix who was admitted with abdominal pain and found to have sigmoid diverticulitis on April 05, 2018 with perforation and abscess.  Initially was felt to be improving on IV Merrem but she continued to have abdominal pain. CT scan of the abdomen and pelvis was obtained April 15, 2018 and was significant for worsening diverticulitis with abdominal fluid collections, she was transferred to [facility] for IR evaluation, he did receive total of 3 percutaneous drains, with repeat CT abdomen pelvis 04/27/2018, still showing remaining fluid collection/abscess despite he strains, and appropriate antibiotic coverage, patient went for exploratory laparotomy with lysis of adhesion, small bowel resection with loop ileostomy, and placement of wound VAC by Dr. Georgette Dover 04/30/2018.  SUBJECTIVE:  Patient in bed, appears comfortable, denies any headache, no fever, no chest pain or pressure, no shortness of breath , mild abdominal pain. No focal weakness.   Assessment & Plan:    Perforated sigmoid diverticulitis/abdominal abscess : General surgery following, initially managed conservatively with multiple abdominal drain placements however repeat CT scan on 04/27/2018 showed worsening of abscess and disease process after which she was taken to the OR for laparotomy with lysis of adhesion, small bowel resection, loop ileostomy and placement of wound VAC by Dr. Georgette Dover on 04/30/2018.  Abscess cultures noted which are growing Candida, Clostridium on with Peptostreptococcus.  She has been on meropenem since 04/06/2018 & it was stopped after 4 weeks on 05/06/2018, she was started on Diflucan on 04/29/2018 and will be continued for 4  weeks with a stop date of 05/28/2018.  Case discussed with ID physician Dr. Johnnye Sima who agrees with the plan.  Her bowel activity seems to be improving and she is tolerating clear liquid diet, on 05/05/2018 I will hold her TNA for 24 hours, added protein supplementation and monitor her oral intake.  If better will try to taper off TNA as soon as possible.   Acute right upper extremity DVT extremity  -  due to PICC line this was just discontinued April 16, 2018 still swollen but improving there is no warmth or redness.  Was kept on full dose anticoagulation from 04/16/2018 till 05/06/2018, currently full dose anticoagulation held due to anemia below.  Anemia of critical illness.  Some element of dilution but her ileostomy output appears dark, despite holding PNA and diuresing her with Lasix her H&H is going down suggesting some ongoing blood loss, continue IV PPI and hold Lovenox on 05/06/2018 until ileostomy output clears.  Goal hemoglobin around 7.5.  Type screen done.  Anemia panel inconclusive.  Acute kidney injury with chronic kidney disease stage III  - recent creatinine of 1.2, ARF resolved currently close to baseline.  Fibromyalgia - continue Lyrica able to take oral  Hypertension improved.  - Pressures high, will add oral Coreg, continue on PRN hydralazine and metoprolol IV.  Advanced weakness and deconditioning -   PT has been consulted they recommend SNF.  Patient encouraged to sit up in the chair rather than laying in bed.  Acute illness required anemia on chronic anemia  - she required total of 4 units PRBC transfusion during hospital stay .monitor closely and transfuse as needed .  Depression patient recently started on Lexapro  Paroxysmal atrial tachycardia.  She has not had any more episodes of PAT since that of April 22, 2018 echocardiogram that was done during showed preserved ejection fraction her beta-blocker was increased, will check TSH.  Hypomagnesemia - replaced  monitor  Type 2 diabetes mellitus controlled  - Continue with insulin sliding scale every 4 hours as she is on TPN, CBG acceptable .  CBG (last 3)  Recent Labs    05/06/18 0009 05/06/18 0407 05/06/18 0809  GLUCAP 100* 102* 114*       DVT prophylaxis: Subcutaneous Lovenox treatment dose for right upper extremity DVT  code Status: Full Family Communication: family at bedside  05/04/18 Disposition Plan: Physical therapy recommended SNF Consultants:   General surgery  IR  ID - phone Dr Johnnye Sima 05/05/18  Procedures:  - Echocardiogram -   Left ventricle: The cavity size was normal. Wall thickness was increased in a pattern of moderate LVH. Systolic function was  vigorous. The estimated ejection fraction was in the range of 65% to 70%. Indeterminant diastolic function. Wall motion was normal; there were no regional wall motion abnormalities. - Aortic valve: Valve area (VTI): 2.74 cm^2. Valve area (Vmax):   2.58 cm^2. Valve area (Vmean): 2.56 cm^2.  -S/PIR perc drains x 2 - 04/16/18, culture with multiple organisms present/none predominant -S/P3rdIR drain 11/13, culturegrowingFEW CANDIDA TROPICALIS, - Right arm PICC line - had DVT and was remioved -  Left arm PICC line - Exploratory laparotomy with lysis of adhesion, small bowel resection with loop ileostomy, and placement of wound VAC by Dr. Georgette Dover 04/30/2018.   Objective: Vitals:   05/05/18 1716 05/05/18 2144 05/05/18 2320 05/06/18 0601  BP: 107/61 (!) 99/58 126/66 110/63  Pulse:  (!) 130 (!) 121 (!) 112  Resp:  16 18 19   Temp:  (!) 97.2 F (36.2 C) (!) 97.5 F (36.4 C) 97.7 F (36.5 C)  TempSrc:  Oral Oral   SpO2:  97% 98% 96%  Weight:      Height:        Intake/Output Summary (Last 24 hours) at 05/06/2018 1007 Last data filed at 05/06/2018 0535 Gross per 24 hour  Intake 2105.08 ml  Output 2189.5 ml  Net -84.42 ml   Filed Weights   04/19/18 0500 04/23/18 0500 04/24/18 0500  Weight: 123 kg 118.2 kg  118.3 kg    Examination:  Awake Alert, Oriented X 3, No new F.N deficits, Normal affect [place].AT,PERRAL Supple Neck,No JVD, No cervical lymphadenopathy appriciated.  Symmetrical Chest wall movement, Good air movement bilaterally, CTAB RRR,No Gallops, Rubs or new Murmurs, No Parasternal Heave +ve B.Sounds, midline surgical wound VAC, right ileostomyileostomy bag, left JP drain with sanguinous material .  NG tube in place No Cyanosis, Clubbing or edema, No new Rash or bruise   Data Reviewed: I have personally reviewed following labs and imaging studies  CBC: Recent Labs  Lab 05/02/18 0505 05/03/18 0333 05/04/18 0348 05/05/18 0351 05/06/18 0406  WBC 22.2* 14.6* 12.3* 13.1* 15.0*  NEUTROABS  --   --   --  10.7*  --   HGB 9.6* 8.8* 8.7* 8.0* 7.4*  HCT 31.1* 27.7* 27.7* 24.8* 23.3*  MCV 94.5 92.6 91.1 91.5 92.8  PLT 314 261 285 262 979   Basic Metabolic Panel: Recent Labs  Lab 05/01/18 0510  05/02/18 0505 05/03/18 0333 05/04/18 0348 05/05/18 0351 05/06/18 0406  NA 138   < > 140 138 139 142 143  K 5.6*   < > 4.4 3.6 3.3* 3.6 4.0  CL 102   < >  100 95* 96* 102 102  CO2 29   < > 32 33* 33* 33* 31  GLUCOSE 257*   < > 177* 138* 199* 200* 113*  BUN 47*   < > 52* 52* 45* 40* 50*  CREATININE 1.16*   < > 1.06* 0.91 0.91 0.88 1.14*  CALCIUM 8.3*   < > 8.5* 8.3* 8.0* 7.8* 8.1*  MG 1.6*  --  1.9  --   --  1.4* 1.8  PHOS 3.9  --   --   --   --  3.6 5.7*   < > = values in this interval not displayed.   GFR: Estimated Creatinine Clearance: 57 mL/min (A) (by C-G formula based on SCr of 1.14 mg/dL (H)). Liver Function Tests: Recent Labs  Lab 05/01/18 0510 05/05/18 0351  AST 36 64*  ALT 21 47*  ALKPHOS 70 93  BILITOT 0.5 0.4  PROT 5.1* 4.9*  ALBUMIN 1.8* 1.7*   No results for input(s): LIPASE, AMYLASE in the last 168 hours. No results for input(s): AMMONIA in the last 168 hours. Coagulation Profile: No results for input(s): INR, PROTIME in the last 168 hours. Cardiac  Enzymes: No results for input(s): CKTOTAL, CKMB, CKMBINDEX, TROPONINI in the last 168 hours. BNP (last 3 results) No results for input(s): PROBNP in the last 8760 hours. HbA1C: No results for input(s): HGBA1C in the last 72 hours. CBG: Recent Labs  Lab 05/05/18 1801 05/05/18 2005 05/06/18 0009 05/06/18 0407 05/06/18 0809  GLUCAP 163* 213* 100* 102* 114*   Lipid Profile: Recent Labs    05/05/18 0351  TRIG 185*   Thyroid Function Tests: No results for input(s): TSH, T4TOTAL, FREET4, T3FREE, THYROIDAB in the last 72 hours. Anemia Panel: Recent Labs    05/05/18 1211  VITAMINB12 466  FOLATE 6.9  FERRITIN 3,581*  TIBC 183*  IRON 45  RETICCTPCT 2.6   Urine analysis:    Component Value Date/Time   COLORURINE YELLOW 04/05/2018 1924   APPEARANCEUR CLEAR 04/05/2018 1924   LABSPEC 1.021 04/05/2018 1924   PHURINE 5.0 04/05/2018 1924   GLUCOSEU NEGATIVE 04/05/2018 1924   HGBUR NEGATIVE 04/05/2018 1924   BILIRUBINUR NEGATIVE 04/05/2018 [place] NEGATIVE 04/05/2018 1924   PROTEINUR NEGATIVE 04/05/2018 1924   UROBILINOGEN 0.2 06/20/2013 1832   NITRITE NEGATIVE 04/05/2018 1924   LEUKOCYTESUR NEGATIVE 04/05/2018 1924   Sepsis Labs: @LABRCNTIP (procalcitonin:4,lacticidven:4)  ) Recent Results (from the past 240 hour(s))  C difficile quick scan w PCR reflex     Status: Abnormal   Collection Time: 04/28/18  9:01 PM  Result Value Ref Range Status   C Diff antigen POSITIVE (A) NEGATIVE Final   C Diff toxin NEGATIVE NEGATIVE Final   C Diff interpretation Results are indeterminate. See PCR results.  Final    Comment: Performed at [facility], [place] [address] [place], [place] [address]  C. Diff by PCR, Reflexed     Status: None   Collection Time: 04/28/18  9:01 PM  Result Value Ref Range Status   Toxigenic C. Difficile by PCR NEGATIVE NEGATIVE Final    Comment: Patient is colonized with non toxigenic C. difficile. May not need treatment unless significant  symptoms are present. Performed at [facility], [place] [address], [place],  [address]   Surgical pcr screen     Status: None   Collection Time: 04/29/18  6:05 PM  Result Value Ref Range Status   MRSA, PCR NEGATIVE NEGATIVE Final   Staphylococcus aureus NEGATIVE  NEGATIVE Final    Comment: (NOTE) The Xpert SA Assay (FDA approved for NASAL specimens in patients 46 years of age and older), is one component of a comprehensive surveillance program. It is not intended to diagnose infection nor to guide or monitor treatment. Performed at [facility], [place] [address], [place], [place] [address]      Radiology Studies: No results found.  Scheduled Meds:  . acetaminophen  500 mg Oral Q8H  . Chlorhexidine Gluconate Cloth  6 each Topical Daily  . famotidine  20 mg Oral BID  . feeding supplement (PRO-STAT SUGAR FREE 64)  30 mL Oral TID WC  . guaiFENesin  600 mg Oral BID  . insulin aspart  0-15 Units Subcutaneous TID WC  . insulin aspart  0-5 Units Subcutaneous QHS  . latanoprost  1 drop Both Eyes QHS  . mouth rinse  15 mL Mouth Rinse BID  . methocarbamol  1,000 mg Oral QID  . metoprolol tartrate  25 mg Oral BID  . nystatin   Topical TID  . pregabalin  75 mg Oral QHS  . sodium chloride flush  5 mL Intracatheter Q8H  . timolol  1 drop Both Eyes BID   Continuous Infusions: . sodium chloride Stopped (05/03/18 0616)  . fluconazole (DIFLUCAN) IV 400 mg (05/05/18 1124)  . lactated ringers     Anti-infectives (From admission, onward)   Start     Dose/Rate Route Frequency Ordered Stop   05/05/18 1245  meropenem (MERREM) 1 g in sodium chloride 0.9 % 100 mL IVPB  Status:  Discontinued     1 g 200 mL/hr over 30 Minutes Intravenous Every 8 hours 05/05/18 1245 05/06/18 1006   05/01/18 1600  meropenem (MERREM) 1 g in sodium chloride 0.9 % 100 mL IVPB  Status:  Discontinued     1 g 200 mL/hr over 30 Minutes Intravenous Every 8 hours 05/01/18 1539 05/05/18 1243   04/30/18 1215   fluconazole (DIFLUCAN) IVPB 400 mg  Status:  Discontinued     400 mg 100 mL/hr over 120 Minutes Intravenous To Surgery 04/30/18 1200 05/01/18 1215   04/29/18 1100  fluconazole (DIFLUCAN) IVPB 400 mg     400 mg 100 mL/hr over 120 Minutes Intravenous Every 24 hours 04/29/18 1006 05/26/18 2359   04/27/18 1500  meropenem (MERREM) 1 g in sodium chloride 0.9 % 100 mL IVPB  Status:  Discontinued     1 g 200 mL/hr over 30 Minutes Intravenous Every 12 hours 04/27/18 1008 05/01/18 1539   04/26/18 1300  metroNIDAZOLE (FLAGYL) IVPB 500 mg  Status:  Discontinued     500 mg 100 mL/hr over 60 Minutes Intravenous Every 6 hours 04/26/18 1257 04/27/18 1041   04/15/18 1700  meropenem (MERREM) 1 g in sodium chloride 0.9 % 100 mL IVPB  Status:  Discontinued     1 g 200 mL/hr over 30 Minutes Intravenous Every 8 hours 04/15/18 1534 04/27/18 1008   04/09/18 2200  meropenem (MERREM) 1 g in sodium chloride 0.9 % 100 mL IVPB  Status:  Discontinued     1 g 200 mL/hr over 30 Minutes Intravenous Every 8 hours 04/09/18 1244 04/14/18 0944   04/06/18 1300  meropenem (MERREM) 1 g in sodium chloride 0.9 % 100 mL IVPB  Status:  Discontinued     1 g 200 mL/hr over 30 Minutes Intravenous Every 12 hours 04/06/18 1145 04/09/18 1244   04/06/18 1145  meropenem (MERREM) 1 g in sodium chloride 0.9 %  100 mL IVPB  Status:  Discontinued     1 g 200 mL/hr over 30 Minutes Intravenous Every 8 hours 04/06/18 1139 04/06/18 1145   04/06/18 0900  ciprofloxacin (CIPRO) IVPB 400 mg  Status:  Discontinued     400 mg 200 mL/hr over 60 Minutes Intravenous Every 12 hours 04/06/18 0732 04/06/18 1139   04/06/18 0300  metroNIDAZOLE (FLAGYL) IVPB 500 mg  Status:  Discontinued     500 mg 100 mL/hr over 60 Minutes Intravenous Every 8 hours 04/05/18 2335 04/06/18 1139   04/05/18 1930  ciprofloxacin (CIPRO) IVPB 400 mg     400 mg 200 mL/hr over 60 Minutes Intravenous  Once 04/05/18 1925 04/05/18 2217   04/05/18 1930  metroNIDAZOLE (FLAGYL) IVPB 500  mg     500 mg 100 mL/hr over 60 Minutes Intravenous  Once 04/05/18 1925 04/05/18 2102      LOS: 29 days   Signature  Lala Lund M.D on 05/06/2018 at 10:07 AM   -  To page go to [email or web address] - password [facility]

## 2018-05-06 NOTE — Progress Notes (Signed)
CSW spoke with patient and her husband at bedside. CSW requested a second SNF choice in case [facility] continues not to have beds available at discharge. Patient's spouse has selected [facility] LCSW [phone]

## 2018-05-06 NOTE — Progress Notes (Signed)
[facility] Progress Note  6 Days Post-Op  Subjective: CC:  Reports bloating unrelated to PO intake, increased over past couple of days. C/o severe lower back and abdominal pain when sitting up in the chair. At home she takes 10 mg hydrocodone q 4h for fibromyalgia. Reports a history of heart palpitations w/ oxycodone. Tearful today. Denies chest pain, SOB, or dysuria (pt reports being unaware of when she is urinating).  Afebrile, tachycardic this AM (112 bpm) Objective: Vital signs in last 24 hours: Temp:  [97.2 F (36.2 C)-97.7 F (36.5 C)] 97.7 F (36.5 C) (11/26 0601) Pulse Rate:  [109-130] 112 (11/26 0601) Resp:  [16-20] 19 (11/26 0601) BP: (99-126)/(58-66) 110/63 (11/26 0601) SpO2:  [94 %-98 %] 96 % (11/26 0601) Last BM Date: 05/03/18  Intake/Output from previous day: 11/25 0701 - 11/26 0700 In: 2105.1 [I.V.:829.5; IV Piggyback:1270.6] Out: 2062.5 [Urine:2000; Drains:62.5] Intake/Output this shift: No intake/output data recorded.  PE: Gen:  Alert, NAD, pleasant Abd: Soft, midline VAC in place             LUQ JP drain - 85cc/24h SS             LLQ JP - 5 cc/24h  SS             RLQ IR drain - minimal SS drainage             Ileostomy - soft, dark stool  Skin: warm and dry, no rashes  Psych: A&Ox3  Lab Results:  Recent Labs    05/05/18 0351 05/06/18 0406  WBC 13.1* 15.0*  HGB 8.0* 7.4*  HCT 24.8* 23.3*  PLT 262 254   BMET Recent Labs    05/05/18 0351 05/06/18 0406  NA 142 143  K 3.6 4.0  CL 102 102  CO2 33* 31  GLUCOSE 200* 113*  BUN 40* 50*  CREATININE 0.88 1.14*  CALCIUM 7.8* 8.1*   PT/INR No results for input(s): LABPROT, INR in the last 72 hours. CMP     Component Value Date/Time   NA 143 05/06/2018 0406   K 4.0 05/06/2018 0406   CL 102 05/06/2018 0406   CO2 31 05/06/2018 0406   GLUCOSE 113 (H) 05/06/2018 0406   BUN 50 (H) 05/06/2018 0406   CREATININE 1.14 (H) 05/06/2018 0406   CALCIUM 8.1 (L) 05/06/2018 0406   PROT 4.9 (L)  05/05/2018 0351   ALBUMIN 1.7 (L) 05/05/2018 0351   AST 64 (H) 05/05/2018 0351   ALT 47 (H) 05/05/2018 0351   ALKPHOS 93 05/05/2018 0351   BILITOT 0.4 05/05/2018 0351   GFRNONAA 47 (L) 05/06/2018 0406   GFRAA 54 (L) 05/06/2018 0406   Lipase  No results found for: LIPASE     Studies/Results: No results found.  Anti-infectives: Anti-infectives (From admission, onward)   Start     Dose/Rate Route Frequency Ordered Stop   05/05/18 1245  meropenem (MERREM) 1 g in sodium chloride 0.9 % 100 mL IVPB     1 g 200 mL/hr over 30 Minutes Intravenous Every 8 hours 05/05/18 1245 05/08/18 0559   05/01/18 1600  meropenem (MERREM) 1 g in sodium chloride 0.9 % 100 mL IVPB  Status:  Discontinued     1 g 200 mL/hr over 30 Minutes Intravenous Every 8 hours 05/01/18 1539 05/05/18 1243   04/30/18 1215  fluconazole (DIFLUCAN) IVPB 400 mg  Status:  Discontinued     400 mg 100 mL/hr over 120 Minutes Intravenous To Surgery 04/30/18 1200 05/01/18  1215   04/29/18 1100  fluconazole (DIFLUCAN) IVPB 400 mg     400 mg 100 mL/hr over 120 Minutes Intravenous Every 24 hours 04/29/18 1006 05/26/18 2359   04/27/18 1500  meropenem (MERREM) 1 g in sodium chloride 0.9 % 100 mL IVPB  Status:  Discontinued     1 g 200 mL/hr over 30 Minutes Intravenous Every 12 hours 04/27/18 1008 05/01/18 1539   04/26/18 1300  metroNIDAZOLE (FLAGYL) IVPB 500 mg  Status:  Discontinued     500 mg 100 mL/hr over 60 Minutes Intravenous Every 6 hours 04/26/18 1257 04/27/18 1041   04/15/18 1700  meropenem (MERREM) 1 g in sodium chloride 0.9 % 100 mL IVPB  Status:  Discontinued     1 g 200 mL/hr over 30 Minutes Intravenous Every 8 hours 04/15/18 1534 04/27/18 1008   04/09/18 2200  meropenem (MERREM) 1 g in sodium chloride 0.9 % 100 mL IVPB  Status:  Discontinued     1 g 200 mL/hr over 30 Minutes Intravenous Every 8 hours 04/09/18 1244 04/14/18 0944   04/06/18 1300  meropenem (MERREM) 1 g in sodium chloride 0.9 % 100 mL IVPB  Status:   Discontinued     1 g 200 mL/hr over 30 Minutes Intravenous Every 12 hours 04/06/18 1145 04/09/18 1244   04/06/18 1145  meropenem (MERREM) 1 g in sodium chloride 0.9 % 100 mL IVPB  Status:  Discontinued     1 g 200 mL/hr over 30 Minutes Intravenous Every 8 hours 04/06/18 1139 04/06/18 1145   04/06/18 0900  ciprofloxacin (CIPRO) IVPB 400 mg  Status:  Discontinued     400 mg 200 mL/hr over 60 Minutes Intravenous Every 12 hours 04/06/18 0732 04/06/18 1139   04/06/18 0300  metroNIDAZOLE (FLAGYL) IVPB 500 mg  Status:  Discontinued     500 mg 100 mL/hr over 60 Minutes Intravenous Every 8 hours 04/05/18 2335 04/06/18 1139   04/05/18 1930  ciprofloxacin (CIPRO) IVPB 400 mg     400 mg 200 mL/hr over 60 Minutes Intravenous  Once 04/05/18 1925 04/05/18 2217   04/05/18 1930  metroNIDAZOLE (FLAGYL) IVPB 500 mg     500 mg 100 mL/hr over 60 Minutes Intravenous  Once 04/05/18 1925 04/05/18 2102       Assessment/Plan HLD 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 - on plavix (last dose 10/27) H/o CVA  Morbidly obese Chronic pain RUE DVT -was onIV heparin, now treatment dose lovenox  Acute perforated sigmoid diverticulitis with multiple abscesses -s/pIR perc drains x 2 - 04/16/18, culture with multiple organisms present/none predominant -S/p3rdIR drain 11/13, culturegrowingFEW CANDIDA TROPICALIS  S/p Exploratory laparotomy, extensive lysis of adhesions, small bowel resection, loop ileostomy, drainage of multiple intra-abdominal abscesses, placement of wound VAC 04/30/18 Dr. Georgette Dover  - POD#6, path negative for malignancy  - dark stool in pouch, hgb trending down very slowly 7.4 today. Follow output, consider holding anticoagulation for a couple of days if still decreasing tomorrow.  - midline vac MWF - single IR drain remains, and 2 JP drains placed intraoperatively -advance to SOFT diet -patient needs to mobilize, increase PO pain control.    ID -merrem 10/27-11/26, flagyl 11/16>>11/17;  WBC trending up 15 from 13; afebrile, some tachycardia, will consider repeat CT abd/pelvis tomorrow to r/o intra-abdominal abcess. FEN -IVF, FLD, start to wean TNA. Prealbumin 19.4 today, add ensure Foley - d/cyesterday  Plan-SOFT diet, pain control, OOB, follow WBC   LOS: 29 days    Obie Dredge, PA-C [facility] Pager: [phone]

## 2018-05-07 ENCOUNTER — Inpatient Hospital Stay (HOSPITAL_COMMUNITY): Payer: Medicare Other

## 2018-05-07 LAB — GLUCOSE, CAPILLARY
GLUCOSE-CAPILLARY: 146 mg/dL — AB (ref 70–99)
Glucose-Capillary: 104 mg/dL — ABNORMAL HIGH (ref 70–99)
Glucose-Capillary: 155 mg/dL — ABNORMAL HIGH (ref 70–99)
Glucose-Capillary: 183 mg/dL — ABNORMAL HIGH (ref 70–99)
Glucose-Capillary: 200 mg/dL — ABNORMAL HIGH (ref 70–99)
Glucose-Capillary: 172 mg/dL — ABNORMAL HIGH (ref 70–99)
Glucose-Capillary: 189 mg/dL — ABNORMAL HIGH (ref 70–99)

## 2018-05-07 LAB — CBC
HCT: 24.7 % — ABNORMAL LOW (ref 36.0–46.0)
Hemoglobin: 7.7 g/dL — ABNORMAL LOW (ref 12.0–15.0)
MCH: 28.8 pg (ref 26.0–34.0)
MCHC: 31.2 g/dL (ref 30.0–36.0)
MCV: 92.5 fL (ref 80.0–100.0)
NRBC: 1.2 % — AB (ref 0.0–0.2)
PLATELETS: 334 10*3/uL (ref 150–400)
RBC: 2.67 MIL/uL — AB (ref 3.87–5.11)
RDW: 17.9 % — ABNORMAL HIGH (ref 11.5–15.5)
WBC: 19.1 10*3/uL — ABNORMAL HIGH (ref 4.0–10.5)

## 2018-05-07 LAB — COMPREHENSIVE METABOLIC PANEL
ALBUMIN: 1.8 g/dL — AB (ref 3.5–5.0)
ALT: 42 U/L (ref 0–44)
AST: 44 U/L — AB (ref 15–41)
Alkaline Phosphatase: 101 U/L (ref 38–126)
Anion gap: 10 (ref 5–15)
BUN: 48 mg/dL — AB (ref 8–23)
CHLORIDE: 101 mmol/L (ref 98–111)
CO2: 30 mmol/L (ref 22–32)
CREATININE: 1.12 mg/dL — AB (ref 0.44–1.00)
Calcium: 7.9 mg/dL — ABNORMAL LOW (ref 8.9–10.3)
GFR calc Af Amer: 56 mL/min — ABNORMAL LOW (ref >60)
GFR, EST NON AFRICAN AMERICAN: 49 mL/min — AB (ref >60)
GLUCOSE: 121 mg/dL — AB (ref 70–99)
Potassium: 3.9 mmol/L (ref 3.5–5.1)
SODIUM: 141 mmol/L (ref 135–145)
Total Bilirubin: 0.6 mg/dL (ref 0.3–1.2)
Total Protein: 5 g/dL — ABNORMAL LOW (ref 6.5–8.1)

## 2018-05-07 LAB — PREPARE RBC (CROSSMATCH)

## 2018-05-07 LAB — HEMOGLOBIN AND HEMATOCRIT, BLOOD
HCT: 25.1 % — ABNORMAL LOW (ref 36.0–46.0)
Hemoglobin: 8.3 g/dL — ABNORMAL LOW (ref 12.0–15.0)

## 2018-05-07 LAB — MAGNESIUM: Magnesium: 1.5 mg/dL — ABNORMAL LOW (ref 1.7–2.4)

## 2018-05-07 LAB — TSH: TSH: 7.944 u[IU]/mL — ABNORMAL HIGH (ref 0.350–4.500)

## 2018-05-07 LAB — PHOSPHORUS: Phosphorus: 4.7 mg/dL — ABNORMAL HIGH (ref 2.5–4.6)

## 2018-05-07 MED ORDER — SODIUM CHLORIDE 0.9% IV SOLUTION
Freq: Once | INTRAVENOUS | Status: AC
  Administered 2018-05-07: 15:00:00 via INTRAVENOUS

## 2018-05-07 MED ORDER — DILTIAZEM HCL 25 MG/5ML IV SOLN
10.0000 mg | Freq: Four times a day (QID) | INTRAVENOUS | Status: DC | PRN
  Administered 2018-05-08: 10 mg via INTRAVENOUS
  Filled 2018-05-07 (×3): qty 5

## 2018-05-07 MED ORDER — IOHEXOL 300 MG/ML  SOLN
100.0000 mL | Freq: Once | INTRAMUSCULAR | Status: AC | PRN
  Administered 2018-05-07: 100 mL via INTRAVENOUS

## 2018-05-07 MED ORDER — METOPROLOL TARTRATE 5 MG/5ML IV SOLN
5.0000 mg | Freq: Once | INTRAVENOUS | Status: AC
  Administered 2018-05-07: 5 mg via INTRAVENOUS
  Filled 2018-05-07: qty 5

## 2018-05-07 MED ORDER — DIPHENHYDRAMINE HCL 50 MG/ML IJ SOLN: 25.0000 mg | Freq: Four times a day (QID) | INTRAMUSCULAR | Status: DC | PRN

## 2018-05-07 MED ORDER — HEPARIN SODIUM (PORCINE) 5000 UNIT/ML IJ SOLN
5000.0000 [IU] | Freq: Three times a day (TID) | INTRAMUSCULAR | Status: DC
  Administered 2018-05-07 – 2018-05-09 (×5): 5000 [IU] via SUBCUTANEOUS
  Filled 2018-05-07 (×6): qty 1

## 2018-05-07 MED ORDER — MAGNESIUM SULFATE 2 GM/50ML IV SOLN
2.0000 g | Freq: Once | INTRAVENOUS | Status: DC
  Filled 2018-05-07: qty 50

## 2018-05-07 MED ORDER — METOPROLOL TARTRATE 5 MG/5ML IV SOLN
2.5000 mg | Freq: Once | INTRAVENOUS | Status: AC
  Administered 2018-05-07: 2.5 mg via INTRAVENOUS
  Filled 2018-05-07: qty 5

## 2018-05-07 MED ORDER — METOPROLOL TARTRATE 5 MG/5ML IV SOLN
10.0000 mg | Freq: Once | INTRAVENOUS | Status: AC
  Administered 2018-05-07: 10 mg via INTRAVENOUS
  Filled 2018-05-07: qty 10

## 2018-05-07 NOTE — Progress Notes (Signed)
[facility] was paged about the Heparin order; night  shift RN during the shift change report was told to hold it. Per MD -to administer Heparin.

## 2018-05-07 NOTE — Progress Notes (Signed)
Patient was on Afib and HR high since yesterday 05/06/2018 Morning . iTwas > 130-140 at morning at 0300 then notified the on call NP twice then given metoprolol total 7.5 mg iv in two times.

## 2018-05-07 NOTE — Progress Notes (Signed)
Physical Therapy Treatment Patient Details Name: [name] MRN: [id number] DOB: 12/20/1945 Today's Date: 05/07/2018    History of Present Illness Ilaria Much  is a 72 y.o. female, w hypertension, hyperlipidemia, dm2, pvd, CVA, OSA, Rheumatoid arthritis apparently c/o LLQ pain over the past several weeks, just started on abx yesterday, pt notes subjective fever as well as nausea.  Pt denies emesis, constipation, diarrhea, brbpr, black stool.  Pt presented due to worsening abdominal pain today. Found to have acute perforated sigmoid diverticulitis with multiple abscesses. Pt is s/p abdominal abscess drains X2.     PT Comments    Pt was in bed with husband and daughter at bedside. As therapist entered the room the daughter stated "my mom will not be participating in therapy today, she has been through too much and isn't feeling well". Daughter and husband proceeded to say to therapist that the pt "will not be getting out of bed to try to walk today". Pt stated that after getting oob yesterday, she was "in pain the rest of the day". Pt tried to negotiate with therapist stating that if therapy was to come back at a later time she may be able to get oob. Once educated on consequences of bed rest, pt agreeable to supine exercises. Pt is very self-limiting and has made slow progress as a result. At end of treatment re-offered return to progress ambulation and pt adamantly refused. Informed MD of pt's refusal. Pt would benefit from continued PT in order to progress toward stated goals and maximize function. Pt remains appropriate for SNF based on current functional status.    Follow Up Recommendations  SNF     Equipment Recommendations  None recommended by PT    Recommendations for Other Services       Precautions / Restrictions Precautions Precautions: Fall;Other (comment) Precaution Comments: 2 abscess drains Restrictions Weight Bearing Restrictions: No    Mobility  Bed Mobility               General bed mobility comments: deferred due to pt and family members stating that pt wasn't appropriate for therapy today.  Transfers                    Ambulation/Gait                 Stairs             Wheelchair Mobility    Modified Rankin (Stroke Patients Only)       Balance Overall balance assessment: Needs assistance Sitting-balance support: Feet supported;Bilateral upper extremity supported Sitting balance-Leahy Scale: Fair       Standing balance-Leahy Scale: Poor                              Cognition Arousal/Alertness: Awake/alert Behavior During Therapy: WFL for tasks assessed/performed Overall Cognitive Status: Within Functional Limits for tasks assessed                                 General Comments: Husband and daughter present a beginning of session      Exercises Total Joint Exercises Ankle Circles/Pumps: AROM;15 reps;Right;Left;Supine Quad Sets: AROM;10 reps;Right;Left;Supine Short Arc Quad: AROM;10 reps;Right;Left;Supine Heel Slides: AROM;5 reps;Left;10 reps;Right;Supine    General Comments General comments (skin integrity, edema, etc.): Pt very self-limiting this session. Pt has stated previously that she feels she isn't  getting enough therapy, but when offered she/and family refuses therapy. With persuasion and education on deconditioning, pt agreed to supine exercises this session.       Pertinent Vitals/Pain Faces Pain Scale: Hurts little more Pain Location: Abdomen Pain Intervention(s): Limited activity within patient's tolerance;Monitored during session    Home Living                      Prior Function            PT Goals (current goals can now be found in the care plan section) Acute Rehab PT Goals Patient Stated Goal: none stated PT Goal Formulation: With patient/family Time For Goal Achievement: 04/28/18 Potential to Achieve Goals: Fair Progress towards  PT goals: Not progressing toward goals - comment(pt self-limiting)    Frequency    Min 3X/week      PT Plan Current plan remains appropriate    Co-evaluation              AM-PAC PT "6 Clicks" Mobility   Outcome Measure  Help needed turning from your back to your side while in a flat bed without using bedrails?: A Lot Help needed moving from lying on your back to sitting on the side of a flat bed without using bedrails?: A Lot Help needed moving to and from a bed to a chair (including a wheelchair)?: A Lot Help needed standing up from a chair using your arms (e.g., wheelchair or bedside chair)?: A Lot Help needed to walk in hospital room?: A Lot Help needed climbing 3-5 steps with a railing? : Total 6 Click Score: 11    End of Session Equipment Utilized During Treatment: Gait belt Activity Tolerance: Patient limited by pain;Patient limited by fatigue Patient left: in bed;with bed alarm set;with nursing/sitter in room;with call bell/phone within reach   PT Visit Diagnosis: Unsteadiness on feet (R26.81);Muscle weakness (generalized) (M62.81)     Time: 3428-7681 PT Time Calculation (min) (ACUTE ONLY): 15 min  Charges:  $Therapeutic Exercise: 8-22 mins                    [address], SPTA   [place] 05/07/2018, 11:59 AM

## 2018-05-07 NOTE — Progress Notes (Signed)
PROGRESS NOTE    [name]  NTI:[id number] DOB: 1946-03-20 DOA: 04/05/2018 PCP: [name], MD    Brief Narrative:   72 year old female with past medical history of hypertension, type 2 diabetes mellitus chronic kidney disease rheumatoid arthritis coronary artery disease S/P CABG on Plavix who was admitted with abdominal pain and found to have sigmoid diverticulitis on April 05, 2018 with perforation and abscess.  Initially was felt to be improving on IV Merrem but she continued to have abdominal pain. CT scan of the abdomen and pelvis was obtained April 15, 2018 and was significant for worsening diverticulitis with abdominal fluid collections, she was transferred to [facility] for IR evaluation, he did receive total of 3 percutaneous drains, with repeat CT abdomen pelvis 04/27/2018, still showing remaining fluid collection/abscess despite he strains, and appropriate antibiotic coverage, patient went for exploratory laparotomy with lysis of adhesion, small bowel resection with loop ileostomy, and placement of wound VAC by Dr. Georgette Dover 04/30/2018.  SUBJECTIVE:  Patient in bed, appears comfortable, denies any headache, no fever, no chest pain or pressure, no shortness of breath , +ve abdominal pain. No focal weakness.    Assessment & Plan:    Perforated sigmoid diverticulitis/abdominal abscess : General surgery following, initially managed conservatively with multiple abdominal drain placements however repeat CT scan on 04/27/2018 showed worsening of abscess and disease process after which she was taken to the OR for laparotomy with lysis of adhesion, small bowel resection, loop ileostomy and placement of wound VAC by Dr. Georgette Dover on 04/30/2018.  Abscess cultures noted which are growing Candida, Clostridium on with Peptostreptococcus.  She has been on meropenem since 04/06/2018 & it was stopped after 4 weeks on 05/06/2018, she was started on Diflucan on 04/29/2018 and will be continued for 4  weeks with a stop date of 05/28/2018.  Plan is to get ID input, repeat CT on 05/07/18, hold TNA for now, monitor PO intake.    Acute right upper extremity DVT extremity  -  due to PICC line this was just discontinued April 16, 2018 still swollen but improving there is no warmth or redness.  Was kept on full dose anticoagulation from 04/16/2018 till 05/06/2018, currently full dose anticoagulation held due to anemia below.  Anemia of critical illness.  Some element of dilution but her ileostomy output appears dark, despite holding PNA and diuresing her with Lasix her H&H is going down suggesting some ongoing blood loss, continue IV PPI and hold Lovenox on 05/06/2018 until ileostomy output clears.  Goal hemoglobin around 7.5.  Type screen done, will give 1 unit on 05/07/18 to improve BP and H rate.  Anemia panel inconclusive.  Acute kidney injury with chronic kidney disease stage III  - recent creatinine of 1.2, ARF resolved currently close to baseline.  Fibromyalgia - continue Lyrica able to take oral  Hypertension improved.  - BP soft, low dose B Blocker.  Advanced weakness and deconditioning -   PT has been consulted they recommend SNF.  Patient encouraged to sit up in the chair rather than laying in bed.  Acute illness required anemia on chronic anemia  - she required total of 4 units PRBC transfusion during hospital stay .monitor closely and transfuse as needed .  Depression patient recently started on Lexapro  Paroxysmal atrial tachycardia.  She has not had any more episodes of PAT since that of April 22, 2018 echocardiogram that was done during showed preserved ejection fraction her beta-blocker was increased, stable TSH.  Hypomagnesemia -  replaced monitor  Type 2 diabetes mellitus controlled  - Continue with insulin sliding scale every 4 hours as she is on TPN, CBG acceptable .  CBG (last 3)  Recent Labs    05/06/18 2345 05/07/18 0432 05/07/18 0825  GLUCAP 102* 104* 155*        DVT prophylaxis: Subcutaneous Lovenox treatment dose for right upper extremity DVT  code Status: Full Family Communication: family at bedside  05/04/18 Disposition Plan: Physical therapy recommended SNF Consultants:   General surgery  IR  ID - phone Dr Johnnye Sima 05/05/18  Procedures:  - Echocardiogram -   Left ventricle: The cavity size was normal. Wall thickness was increased in a pattern of moderate LVH. Systolic function was  vigorous. The estimated ejection fraction was in the range of 65% to 70%. Indeterminant diastolic function. Wall motion was normal; there were no regional wall motion abnormalities. - Aortic valve: Valve area (VTI): 2.74 cm^2. Valve area (Vmax):   2.58 cm^2. Valve area (Vmean): 2.56 cm^2.  -S/PIR perc drains x 2 - 04/16/18, culture with multiple organisms present/none predominant -S/P3rdIR drain 11/13, culturegrowingFEW CANDIDA TROPICALIS, - Right arm PICC line - had DVT and was remioved -  Left arm PICC line - Exploratory laparotomy with lysis of adhesion, small bowel resection with loop ileostomy, and placement of wound VAC by Dr. Georgette Dover 04/30/2018.   Objective: Vitals:   05/07/18 0603 05/07/18 0757 05/07/18 0808 05/07/18 0856  BP: (!) 135/107 (!) 129/18  (!) 91/39  Pulse:  (!) 150 (!) 150 (!) 102  Resp:      Temp:      TempSrc:      SpO2:      Weight:      Height:        Intake/Output Summary (Last 24 hours) at 05/07/2018 1213 Last data filed at 05/07/2018 1046 Gross per 24 hour  Intake 270 ml  Output 1195 ml  Net -925 ml   Filed Weights   04/19/18 0500 04/23/18 0500 04/24/18 0500  Weight: 123 kg 118.2 kg 118.3 kg    Examination:  Awake Alert, Oriented X 3, No new F.N deficits, Normal affect [place].AT,PERRAL Supple Neck,No JVD, No cervical lymphadenopathy appriciated.  Symmetrical Chest wall movement, Good air movement bilaterally, CTAB RRR,No Gallops, Rubs or new Murmurs, No Parasternal Heave +ve B.Sounds, midline  surgical wound VAC, right ileostomyileostomy bag, left JP drain with sanguinous material .  NG tube in place No Cyanosis, Clubbing or edema, No new Rash or bruise   Data Reviewed: I have personally reviewed following labs and imaging studies  CBC: Recent Labs  Lab 05/03/18 0333 05/04/18 0348 05/05/18 0351 05/06/18 0406 05/07/18 0348  WBC 14.6* 12.3* 13.1* 15.0* 19.1*  NEUTROABS  --   --  10.7*  --   --   HGB 8.8* 8.7* 8.0* 7.4* 7.7*  HCT 27.7* 27.7* 24.8* 23.3* 24.7*  MCV 92.6 91.1 91.5 92.8 92.5  PLT 261 285 262 254 505   Basic Metabolic Panel: Recent Labs  Lab 05/01/18 0510  05/02/18 0505 05/03/18 0333 05/04/18 0348 05/05/18 0351 05/06/18 0406 05/07/18 0348  NA 138   < > 140 138 139 142 143 141  K 5.6*   < > 4.4 3.6 3.3* 3.6 4.0 3.9  CL 102   < > 100 95* 96* 102 102 101  CO2 29   < > 32 33* 33* 33* 31 30  GLUCOSE 257*   < > 177* 138* 199* 200* 113* 121*  BUN 47*   < >  52* 52* 45* 40* 50* 48*  CREATININE 1.16*   < > 1.06* 0.91 0.91 0.88 1.14* 1.12*  CALCIUM 8.3*   < > 8.5* 8.3* 8.0* 7.8* 8.1* 7.9*  MG 1.6*  --  1.9  --   --  1.4* 1.8 1.5*  PHOS 3.9  --   --   --   --  3.6 5.7* 4.7*   < > = values in this interval not displayed.   GFR: Estimated Creatinine Clearance: 58.1 mL/min (A) (by C-G formula based on SCr of 1.12 mg/dL (H)). Liver Function Tests: Recent Labs  Lab 05/01/18 0510 05/05/18 0351 05/07/18 0348  AST 36 64* 44*  ALT 21 47* 42  ALKPHOS 70 93 101  BILITOT 0.5 0.4 0.6  PROT 5.1* 4.9* 5.0*  ALBUMIN 1.8* 1.7* 1.8*   No results for input(s): LIPASE, AMYLASE in the last 168 hours. No results for input(s): AMMONIA in the last 168 hours. Coagulation Profile: No results for input(s): INR, PROTIME in the last 168 hours. Cardiac Enzymes: No results for input(s): CKTOTAL, CKMB, CKMBINDEX, TROPONINI in the last 168 hours. BNP (last 3 results) No results for input(s): PROBNP in the last 8760 hours. HbA1C: No results for input(s): HGBA1C in the last  72 hours. CBG: Recent Labs  Lab 05/06/18 1701 05/06/18 2054 05/06/18 2345 05/07/18 0432 05/07/18 0825  GLUCAP 137* 109* 102* 104* 155*   Lipid Profile: Recent Labs    05/05/18 0351  TRIG 185*   Thyroid Function Tests: Recent Labs    05/07/18 0348  TSH 7.944*   Anemia Panel: Recent Labs    05/05/18 1211  VITAMINB12 466  FOLATE 6.9  FERRITIN 3,581*  TIBC 183*  IRON 45  RETICCTPCT 2.6   Urine analysis:    Component Value Date/Time   COLORURINE YELLOW 04/05/2018 1924   APPEARANCEUR CLEAR 04/05/2018 1924   LABSPEC 1.021 04/05/2018 1924   PHURINE 5.0 04/05/2018 1924   GLUCOSEU NEGATIVE 04/05/2018 1924   HGBUR NEGATIVE 04/05/2018 1924   BILIRUBINUR NEGATIVE 04/05/2018 [place] NEGATIVE 04/05/2018 1924   PROTEINUR NEGATIVE 04/05/2018 1924   UROBILINOGEN 0.2 06/20/2013 1832   NITRITE NEGATIVE 04/05/2018 1924   LEUKOCYTESUR NEGATIVE 04/05/2018 1924   Sepsis Labs: @LABRCNTIP (procalcitonin:4,lacticidven:4)  ) Recent Results (from the past 240 hour(s))  C difficile quick scan w PCR reflex     Status: Abnormal   Collection Time: 04/28/18  9:01 PM  Result Value Ref Range Status   C Diff antigen POSITIVE (A) NEGATIVE Final   C Diff toxin NEGATIVE NEGATIVE Final   C Diff interpretation Results are indeterminate. See PCR results.  Final    Comment: Performed at [facility], [place] [address] [place], [place] [address]  C. Diff by PCR, Reflexed     Status: None   Collection Time: 04/28/18  9:01 PM  Result Value Ref Range Status   Toxigenic C. Difficile by PCR NEGATIVE NEGATIVE Final    Comment: Patient is colonized with non toxigenic C. difficile. May not need treatment unless significant symptoms are present. Performed at [facility], [place] [address] [place],  [address]   Surgical pcr screen     Status: None   Collection Time: 04/29/18  6:05 PM  Result Value Ref Range Status   MRSA, PCR NEGATIVE NEGATIVE Final   Staphylococcus aureus  NEGATIVE NEGATIVE Final    Comment: (NOTE) The Xpert SA Assay (FDA approved for NASAL specimens in patients 69 years of age and older), is  one component of a comprehensive surveillance program. It is not intended to diagnose infection nor to guide or monitor treatment. Performed at [facility], [place] [address] [place], [place] [address]      Radiology Studies: No results found.  Scheduled Meds:  . acetaminophen  500 mg Oral Q8H  . Chlorhexidine Gluconate Cloth  6 each Topical Daily  . famotidine  20 mg Oral BID  . feeding supplement (PRO-STAT SUGAR FREE 64)  30 mL Oral TID WC  . guaiFENesin  600 mg Oral BID  . insulin aspart  0-15 Units Subcutaneous TID WC  . insulin aspart  0-5 Units Subcutaneous QHS  . latanoprost  1 drop Both Eyes QHS  . mouth rinse  15 mL Mouth Rinse BID  . methocarbamol  1,000 mg Oral QID  . metoprolol tartrate  25 mg Oral BID  . nystatin   Topical TID  . pregabalin  75 mg Oral QHS  . timolol  1 drop Both Eyes BID   Continuous Infusions: . sodium chloride Stopped (05/03/18 0616)  . fluconazole (DIFLUCAN) IV 400 mg (05/07/18 1037)  . magnesium sulfate 1 - 4 g bolus IVPB 50 mL/hr at 05/07/18 1057   Anti-infectives (From admission, onward)   Start     Dose/Rate Route Frequency Ordered Stop   05/05/18 1245  meropenem (MERREM) 1 g in sodium chloride 0.9 % 100 mL IVPB  Status:  Discontinued     1 g 200 mL/hr over 30 Minutes Intravenous Every 8 hours 05/05/18 1245 05/06/18 1006   05/01/18 1600  meropenem (MERREM) 1 g in sodium chloride 0.9 % 100 mL IVPB  Status:  Discontinued     1 g 200 mL/hr over 30 Minutes Intravenous Every 8 hours 05/01/18 1539 05/05/18 1243   04/30/18 1215  fluconazole (DIFLUCAN) IVPB 400 mg  Status:  Discontinued     400 mg 100 mL/hr over 120 Minutes Intravenous To Surgery 04/30/18 1200 05/01/18 1215   04/29/18 1100  fluconazole (DIFLUCAN) IVPB 400 mg     400 mg 100 mL/hr over 120 Minutes Intravenous Every 24 hours 04/29/18  1006 05/26/18 2359   04/27/18 1500  meropenem (MERREM) 1 g in sodium chloride 0.9 % 100 mL IVPB  Status:  Discontinued     1 g 200 mL/hr over 30 Minutes Intravenous Every 12 hours 04/27/18 1008 05/01/18 1539   04/26/18 1300  metroNIDAZOLE (FLAGYL) IVPB 500 mg  Status:  Discontinued     500 mg 100 mL/hr over 60 Minutes Intravenous Every 6 hours 04/26/18 1257 04/27/18 1041   04/15/18 1700  meropenem (MERREM) 1 g in sodium chloride 0.9 % 100 mL IVPB  Status:  Discontinued     1 g 200 mL/hr over 30 Minutes Intravenous Every 8 hours 04/15/18 1534 04/27/18 1008   04/09/18 2200  meropenem (MERREM) 1 g in sodium chloride 0.9 % 100 mL IVPB  Status:  Discontinued     1 g 200 mL/hr over 30 Minutes Intravenous Every 8 hours 04/09/18 1244 04/14/18 0944   04/06/18 1300  meropenem (MERREM) 1 g in sodium chloride 0.9 % 100 mL IVPB  Status:  Discontinued     1 g 200 mL/hr over 30 Minutes Intravenous Every 12 hours 04/06/18 1145 04/09/18 1244   04/06/18 1145  meropenem (MERREM) 1 g in sodium chloride 0.9 % 100 mL IVPB  Status:  Discontinued     1 g 200 mL/hr over 30 Minutes Intravenous Every 8 hours 04/06/18 1139 04/06/18 1145  04/06/18 0900  ciprofloxacin (CIPRO) IVPB 400 mg  Status:  Discontinued     400 mg 200 mL/hr over 60 Minutes Intravenous Every 12 hours 04/06/18 0732 04/06/18 1139   04/06/18 0300  metroNIDAZOLE (FLAGYL) IVPB 500 mg  Status:  Discontinued     500 mg 100 mL/hr over 60 Minutes Intravenous Every 8 hours 04/05/18 2335 04/06/18 1139   04/05/18 1930  ciprofloxacin (CIPRO) IVPB 400 mg     400 mg 200 mL/hr over 60 Minutes Intravenous  Once 04/05/18 1925 04/05/18 2217   04/05/18 1930  metroNIDAZOLE (FLAGYL) IVPB 500 mg     500 mg 100 mL/hr over 60 Minutes Intravenous  Once 04/05/18 1925 04/05/18 2102      LOS: 30 days   Signature  Lala Lund M.D on 05/07/2018 at 12:13 PM   -  To page go to [email or web address] - password [facility]

## 2018-05-07 NOTE — Progress Notes (Signed)
Calorie Count Note  48-hour calorie count ordered and initiated yesterday at dinner meal. Calorie count to end tomorrow, 11/28, after lunch meal.  Diet: Soft Supplements: - Premier Protein shakes (family to bring from home), each provides 160 kcal and 30 grams of protein  11/26: Dinner (note: no meal slip in calorie count folder, RD obtained information via discussion with pt and wife): 88 kcal, 0 grams of protein (25% of banana, 50% of peaches, 1 bite of mashed potatoes, 1 bite of steak) Supplements: 160 kcal, 30 grams of protein (100% of 1 Premier Protein shake from home)  11/27: Breakfast: 130 kcal, 7 grams of protein (50% of creamer and coffee, 50% of whole milk, 50% of strawberry yogurt, 2% of white toast) Lunch: 80 kcal, 1 gram of protein (50% of sugar cookie) Supplements: 80 kcal, 15 grams of protein (50% of 1 Premier Protein shake)  Total 24-hour intake: 538 kcal (30% of minimum estimated needs)  53 protein (59% of minimum estimated needs)  Nutrition Diagnosis: Inadequate oral intake related to inability to eat as evidenced by energy intake < or equal to 50% for > or equal to 5 days (NPO/clear liquid status >5 days).  Goal: Patient will meet greater than or equal to 90% of their needs  Intervention: - 48-hour calorie count - Premier Protein BID (family to bring from home)   Gaynell Face, MS, RD, [place] Diet[facility] Pager: [phone] Weekend/After Hours: [phone]

## 2018-05-07 NOTE — Progress Notes (Signed)
Nutrition Follow-up  DOCUMENTATION CODES:   Morbid obesity  INTERVENTION:   - Pt needs new weight - daily weights previously ordered but no new weight since 11/14  - Continue 48-hour calorie count per MD (to end 11/28 after lunch meal)  - Family to bring Premier Protein oral nutrition supplements from home due to pt refusing all oral nutrition supplements offered, recommend consumption BID as each provides 160 kcal and 30 grams of protein  - d/c Pro-stat due to pt refusal  - Diet advancement per Surgery  NUTRITION DIAGNOSIS:   Inadequate oral intake related to inability to eat as evidenced by energy intake < or equal to 50% for > or equal to 5 days (NPO/clear liquid status >5 days).  Progressing  GOAL:   Patient will meet greater than or equal to 90% of their needs  Progressing  MONITOR:   PO intake, Diet advancement, Labs  REASON FOR ASSESSMENT:   Consult New TPN/TNA  ASSESSMENT:   72 year old female with PMH significant for CAD, HLD/HTN, CVA, T2DM, RA, and fibromyalgia/chronic pain. Pt presented w/ 2 weeks of lower abdominal pain. Had recently been dx w/ UTI and palced on abx, but pain continued. Initial CT scan showed diverticulitis w/o perf, but pt developed hypotension and repeat CT did show perforation. Surgery consulted.  10/26-10/31 - NPO 10/31 - CLD 11/1 - FLD 11/5 - soft diet(had 2 meals) 11/6 - NPO, s/p IR placement of 2 abdominal abscess drains 11/7-11/11 - CLD 11/12 - CT scan showing resolution of large pelvic abscess, minimal residual mesenteric abscess, s/p additional drain placement 11/13 - s/p PICC placement, TPN initiated at 40 ml/hr, CT showing right lower quadrant abdomen air/fluid collection, s/p IR placement of third abscess drain 11/14 - CLD 11/15 - FLD 11/16 - soft diet, TPN increased to 60 ml/hr 11/17 - TPN increased to goal rate of 80 ml/hr, CT showing resolving previous abscesses but 2 fluid collections/abscesses present 11/19 -  CLD 11/20 - s/p ex-lap, extensive LOA, small bowel resection, loop ileostomy, drainage of multiple abscesses, wound VAC 11/23 - NG clamping trials 11/24 - NG d/c, clear liquid trials 11/25 - FLD, TPN held 11/26 - soft diet, TPN d/c  Pt continues to refuse Ensure Enlive, Glucerna, Pro-stat, and Boost Breeze oral nutrition supplements and reports that the only supplement she will drink is Engineer, civil (consulting). Pt's family is bringing Community education officer or equivalent from home. Recommend pt consume 2 Premier Protein shakes daily.  Please refer to calorie count note to follow for details regarding PO intake.  Spoke with pt and husband at bedside. Pt states that she had a rough morning with "bag change." Pt reports that her appetite is poor but improving. Pt states that she drank one Premier Protein shake yesterday. Noted several in pt's room that family had brought it.  Pt states that she did not like the food on her dinner tray yesterday. Pt's husband states, "I tried it, and I wouldn't even feed it to my dog." Pt's husband noted percent of each meal item completed and put this in envelope on pt's door.  Discussed pt and calorie count with RN.  Medications reviewed and include: Pepcid, Pro-stat TID (pt refusing 100%), SSI, IV magnesium sulfate 2 grams once  Labs reviewed: BUN 48 (H), creatinine 1.12 (H), phosphorus 4.7 (H), magnesium 1.5 (L) - being replaced, hemoglobin 7.7 (L)  UOP: 1000 ml x 24 hours Left abdominal JP drain: 40 ml x 24 hours Left lateral JP drain: 5 ml x  24 hours Right lateral drain: 0 ml x 24 hours Ileostomy: 150 ml today  Diet Order:   Diet Order            DIET SOFT Room service appropriate? Yes; Fluid consistency: Thin  Diet effective now              EDUCATION NEEDS:   Education needs have been addressed  Skin:  Skin Assessment: Skin Integrity Issues: Wound Vac: abdomen Incisions: abdomen  Last BM:  11/27 (150 ml via ileostomy)  Height:   Ht  Readings from Last 1 Encounters:  04/16/18 5' 5.5" (1.664 m)    Weight:   Wt Readings from Last 1 Encounters:  04/24/18 118.3 kg    Ideal Body Weight:  56.82 kg  BMI:  Body mass index is 42.74 kg/m.  Estimated Nutritional Needs:   Kcal:  1800-2000  Protein:  90-105 grams  Fluid:  1.8-2.0 L    Gaynell Face, MS, RD, LDN Inpatient Clinical Dietitian Pager: [phone] Weekend/After Hours: [phone]

## 2018-05-07 NOTE — Consult Note (Signed)
[facility] Nurse ostomy follow up Met with patient, patient's husband and patient's daughter at the bedside for teaching.  Patient's daughter lives across the street and will be primary support person for care. Husband puts a mask on for pouch change today. Patient is not able to see stoma well and is only minimally engaged in the teaching session today. She is not feeling well and has had some BP and cardiac issues in the last 24 hours.  Stoma type/location: RLQ, end ileostomy Stomal assessment/size: 1 3/4" round, budded slightly with small dip in the abdominal topography at 9 o'clock  Peristomal assessment: intact, clean  Treatment options for stomal/peristomal skin: using 2" barrier ring to aid in seal with ileostomy and for the dip in the skin at 9 o'clock  Output liquid, dark black, appears to still have blood present Ostomy pouching: 1pc.flexible convex.  Education provided:  Explained role of ostomy nurse and creation of stoma  Explained stoma characteristics (budded, flush, color, texture, care) Demonstrated pouch change (cutting new skin barrier, measuring stoma, cleaning peristomal skin and stoma, use of barrier ring) Education on emptying when 1/3 to 1/2 full and how to empty Demonstrated use of wick to clean spout   Enrolled patient in [employer] Discharge program: Yes  [place] Nurse will follow along with you for continued support with ostomy teaching and care [place] MSN, [place], [place], [place], [place]

## 2018-05-07 NOTE — Progress Notes (Signed)
Patient ID: [name], female   DOB: 09-Feb-1946, 72 y.o.   MRN: [id number] Latest CT A/P reviewed by Drs. Henn/Yamagata; there is no existing collection that is drainable at this time. Above d/w CCS PA.

## 2018-05-07 NOTE — Consult Note (Signed)
[facility] for Infectious Disease  Total days of antibiotics 29        Day 27 meropenem        Day 9 fluconazole               Reason for Consult: intra-abdominal infection   Referring Physician: singh  Principal Problem:   Diverticulitis of large intestine with perforation and abscess Active Problems:   DM (diabetes mellitus) ([place])   HTN (hypertension)   Rheumatoid arthritis ([place])   Anemia   Renal insufficiency   Sepsis due to undetermined organism ([place])   CKD (chronic kidney disease) stage 3, GFR 30-59 ml/min (HCC)   Fibromyalgia   Acute renal failure superimposed on stage 3 chronic kidney disease (HCC)    HPI: [name] is a 72 y.o. female with hx of HTN, DM, CKD, admitted for diverticulitis with perforation and intra-abdominal abscess with initial CT scan 11/5 revealsat least 3 fluid collectionsin the lower abdomen/pelvis with previous perforated diverticulitis; fluid collections are7.3 x 5.8 cm in left lower quadrant, air-fluid collection measuring 8.2 x 5.9 cm in the right lower quadrant, and8.9 x 5.3 cm fluid collection in the pelvis in the pre rectal space which appears to contain stool and fluid s/pIR perc drains x 2 - 04/16/18, culture with multiple organisms present/none predominant. She had repeat CT scan 11/12 showed resolution of large pelvic abscess, minimal residual mesenteric abscess although this fluid appears to communicate with a second collection in the mesentery in the LEFT mid abdomen adjacent to a small bowel loop measuring 4.4 x 3.5 cm, and persistent large extraluminal gas collection in the RIGHT pelvis 8.2 x 5.0 x 6.8 cm. She had a third IR drain 11/13.   Cultures have shown c.tropicalis on 11/13 and RARE CLOSTRIDIUM CLOSTRIDIOFORME  RARE PEPTOSTREPTOCOCCUS ASACCHAROLYTICUS  On 11/6  Due to fluid reaccumulation, she underwent  Exploratory laparotomy, extensive lysis of adhesions, small bowel resection, loop ileostomy, drainage of multiple  intra-abdominal abscesses, placement of wound VAC on 11/20. She still has 3 drains in place and has been maintained on meropenem and fluconazole since her surgery (but was started actually since admit) leukocytosis has not improved. ID asked to weight in on abtx management. She is afebrile but WBC in 19K today. Her abdominal wall wound bed is pale appearance but no gross purulence. Her upper left sided drain has dark bilious fluid.  Past Medical History:  Diagnosis Date  . Anginal pain [facility])    sees Dr Einar Gip.   . Arthritis    rheumatoid ..   . Diabetes mellitus without complication (HCC)    Metformin 2.3.2017  . Diverticulitis   . Diverticulitis of large intestine with perforation and abscess 04/05/2018  . Dysrhythmia   . Fibromyalgia   . GERD (gastroesophageal reflux disease)   . High cholesterol   . Hypertension   . Liver disease 08- 2012   per Dr Dagmar Hait pt has enlarged liver  . Peripheral vascular disease (HCC)    legs  . Pneumonia 06/17/2016  . Sleep apnea    sleep study  oct 2012  . Stroke [facility]) 2011   [place]      Allergies:  Allergies  Allergen Reactions  . Lactose Intolerance (Gi) Other (See Comments)    G.I. Upset  . Butrans [Buprenorphine] Rash and Other (See Comments)    Infected skin underneath application  . Penicillins Rash    Facial rash Has patient had a PCN reaction causing immediate rash, facial/tongue/throat  swelling, SOB or lightheadedness with hypotension: Yes Has patient had a PCN reaction causing severe rash involving mucus membranes or skin necrosis: No Has patient had a PCN reaction that required hospitalization No Has patient had a PCN reaction occurring within the last 10 years: Yes If all of the above answers are "NO", then may proceed with Cephalosporin use.   . Simvastatin Rash   MEDICATIONS: . sodium chloride   Intravenous Once  . acetaminophen  500 mg Oral Q8H  . Chlorhexidine Gluconate Cloth  6 each Topical Daily  . famotidine   20 mg Oral BID  . guaiFENesin  600 mg Oral BID  . heparin injection (subcutaneous)  5,000 Units Subcutaneous Q8H  . insulin aspart  0-15 Units Subcutaneous TID WC  . insulin aspart  0-5 Units Subcutaneous QHS  . latanoprost  1 drop Both Eyes QHS  . mouth rinse  15 mL Mouth Rinse BID  . methocarbamol  1,000 mg Oral QID  . metoprolol tartrate  25 mg Oral BID  . nystatin   Topical TID  . pregabalin  75 mg Oral QHS  . timolol  1 drop Both Eyes BID    Social History   Tobacco Use  . Smoking status: Former Smoker    Packs/day: 0.25    Years: 5.00    Pack years: 1.25    Types: Cigarettes    Last attempt to quit: 06/11/1984    Years since quitting: 33.9  . Smokeless tobacco: Never Used  Substance Use Topics  . Alcohol use: Yes    Comment: occ  . Drug use: No    Types: Hydrocodone    Family History  Problem Relation Age of Onset  . Hypertension Daughter   . Rheum arthritis Maternal Grandmother      Review of Systems  Constitutional: Negative for fever, chills, diaphoresis, activity change, appetite change, fatigue and unexpected weight change.  HENT: Negative for congestion, sore throat, rhinorrhea, sneezing, trouble swallowing and sinus pressure.  Eyes: Negative for photophobia and visual disturbance.  Respiratory: Negative for cough, chest tightness, shortness of breath, wheezing and stridor.  Cardiovascular: Negative for chest pain, palpitations and leg swelling.  Gastrointestinal: Negative for nausea, vomiting, abdominal pain, diarrhea, constipation, blood in stool, abdominal distention and anal bleeding.  Genitourinary: Negative for dysuria, hematuria, flank pain and difficulty urinating.  Musculoskeletal: Negative for myalgias, back pain, joint swelling, arthralgias and gait problem.  Skin: Negative for color change, pallor, rash and wound.  Neurological: Negative for dizziness, tremors, weakness and light-headedness.  Hematological: Negative for adenopathy. Does not  bruise/bleed easily.  Psychiatric/Behavioral: Negative for behavioral problems, confusion, sleep disturbance, dysphoric mood, decreased concentration and agitation.     OBJECTIVE: Temp:  [97.7 F (36.5 C)-98.6 F (37 C)] 98.6 F (37 C) (11/27 1506) Pulse Rate:  [75-150] 118 (11/27 1506) Resp:  [16-18] 18 (11/27 1506) BP: (91-159)/(18-107) 118/77 (11/27 1506) SpO2:  [88 %-96 %] 95 % (11/27 1506) Physical Exam  Constitutional:  oriented to person, place, and time. appears well-developed and well-nourished. No distress.  HENT: [place]/AT, PERRLA, no scleral icterus Mouth/Throat: Oropharynx is clear and moist. No oropharyngeal exudate.  Cardiovascular: Normal rate, regular rhythm and normal heart sounds. Exam reveals no gallop and no friction rub.  No murmur heard.  Pulmonary/Chest: Effort normal and breath sounds normal. No respiratory distress.  has no wheezes.  Neck = supple, no nuchal rigidity Abdominal: Soft. Bowel sounds are decreased. Ileostomy in RLQ, RLQ drain has serosanginous blood. Large mildline wound bed, deep  due to subcutaneous layer, pale in appearance, slight dark murky fluid at most distal aspect. LUQ drain has dark bilious fluid, LLQ has serosangionus fluid Ext: pitting edema BLE. Lymphadenopathy: no cervical adenopathy. No axillary adenopathy Neurological: alert and oriented to person, place, and time.  Skin: Skin is warm and dry. No rash noted. No erythema.  Psychiatric: a normal mood and affect.  behavior is normal.    LABS: Results for orders placed or performed during the hospital encounter of 04/05/18 (from the past 48 hour(s))  Glucose, capillary     Status: Abnormal   Collection Time: 05/05/18  6:01 PM  Result Value Ref Range   Glucose-Capillary 163 (H) 70 - 99 mg/dL  Glucose, capillary     Status: Abnormal   Collection Time: 05/05/18  8:05 PM  Result Value Ref Range   Glucose-Capillary 213 (H) 70 - 99 mg/dL   Comment 1 Notify RN   Glucose, capillary      Status: Abnormal   Collection Time: 05/06/18 12:09 AM  Result Value Ref Range   Glucose-Capillary 100 (H) 70 - 99 mg/dL  CBC     Status: Abnormal   Collection Time: 05/06/18  4:06 AM  Result Value Ref Range   WBC 15.0 (H) 4.0 - 10.5 K/uL   RBC 2.51 (L) 3.87 - 5.11 MIL/uL   Hemoglobin 7.4 (L) 12.0 - 15.0 g/dL   HCT 23.3 (L) 36.0 - 46.0 %   MCV 92.8 80.0 - 100.0 fL   MCH 29.5 26.0 - 34.0 pg   MCHC 31.8 30.0 - 36.0 g/dL   RDW 17.5 (H) 11.5 - 15.5 %   Platelets 254 150 - 400 K/uL   nRBC 1.5 (H) 0.0 - 0.2 %    Comment: Performed at [facility], [address], [place], [place] [address]  Magnesium     Status: None   Collection Time: 05/06/18  4:06 AM  Result Value Ref Range   Magnesium 1.8 1.7 - 2.4 mg/dL    Comment: Performed at [facility] [address] [place], [place] [address]  Basic metabolic panel     Status: Abnormal   Collection Time: 05/06/18  4:06 AM  Result Value Ref Range   Sodium 143 135 - 145 mmol/L   Potassium 4.0 3.5 - 5.1 mmol/L   Chloride 102 98 - 111 mmol/L   CO2 31 22 - 32 mmol/L   Glucose, Bld 113 (H) 70 - 99 mg/dL   BUN 50 (H) 8 - 23 mg/dL   Creatinine, Ser 1.14 (H) 0.44 - 1.00 mg/dL   Calcium 8.1 (L) 8.9 - 10.3 mg/dL   GFR calc non Af Amer 47 (L) >60 mL/min   GFR calc Af Amer 54 (L) >60 mL/min    Comment: (NOTE) The eGFR has been calculated using the CKD EPI equation. This calculation has not been validated in all clinical situations. eGFR's persistently <60 mL/min signify possible Chronic Kidney Disease.    Anion gap 10 5 - 15    Comment: Performed at [facility] [address], [place], [place] [address]  Phosphorus     Status: Abnormal   Collection Time: 05/06/18  4:06 AM  Result Value Ref Range   Phosphorus 5.7 (H) 2.5 - 4.6 mg/dL    Comment: Performed at [facility] [address], [place], [place] [address]  Glucose, capillary     Status: Abnormal   Collection Time: 05/06/18  4:07 AM  Result Value  Ref Range     Glucose-Capillary 102 (H) 70 - 99 mg/dL  Glucose, capillary     Status: Abnormal   Collection Time: 05/06/18  8:09 AM  Result Value Ref Range   Glucose-Capillary 114 (H) 70 - 99 mg/dL  Glucose, capillary     Status: Abnormal   Collection Time: 05/06/18 11:29 AM  Result Value Ref Range   Glucose-Capillary 170 (H) 70 - 99 mg/dL  Glucose, capillary     Status: Abnormal   Collection Time: 05/06/18  5:01 PM  Result Value Ref Range   Glucose-Capillary 137 (H) 70 - 99 mg/dL  Glucose, capillary     Status: Abnormal   Collection Time: 05/06/18  8:54 PM  Result Value Ref Range   Glucose-Capillary 109 (H) 70 - 99 mg/dL  Glucose, capillary     Status: Abnormal   Collection Time: 05/06/18 11:45 PM  Result Value Ref Range   Glucose-Capillary 102 (H) 70 - 99 mg/dL  CBC     Status: Abnormal   Collection Time: 05/07/18  3:48 AM  Result Value Ref Range   WBC 19.1 (H) 4.0 - 10.5 K/uL   RBC 2.67 (L) 3.87 - 5.11 MIL/uL   Hemoglobin 7.7 (L) 12.0 - 15.0 g/dL   HCT 24.7 (L) 36.0 - 46.0 %   MCV 92.5 80.0 - 100.0 fL   MCH 28.8 26.0 - 34.0 pg   MCHC 31.2 30.0 - 36.0 g/dL   RDW 17.9 (H) 11.5 - 15.5 %   Platelets 334 150 - 400 K/uL   nRBC 1.2 (H) 0.0 - 0.2 %    Comment: Performed at [facility], [place] [address], [place], [place] [address]  Comprehensive metabolic panel     Status: Abnormal   Collection Time: 05/07/18  3:48 AM  Result Value Ref Range   Sodium 141 135 - 145 mmol/L   Potassium 3.9 3.5 - 5.1 mmol/L   Chloride 101 98 - 111 mmol/L   CO2 30 22 - 32 mmol/L   Glucose, Bld 121 (H) 70 - 99 mg/dL   BUN 48 (H) 8 - 23 mg/dL   Creatinine, Ser 1.12 (H) 0.44 - 1.00 mg/dL   Calcium 7.9 (L) 8.9 - 10.3 mg/dL   Total Protein 5.0 (L) 6.5 - 8.1 g/dL   Albumin 1.8 (L) 3.5 - 5.0 g/dL   AST 44 (H) 15 - 41 U/L   ALT 42 0 - 44 U/L   Alkaline Phosphatase 101 38 - 126 U/L   Total Bilirubin 0.6 0.3 - 1.2 mg/dL   GFR calc non Af Amer 49 (L) >60 mL/min   GFR calc Af Amer 56 (L) >60 mL/min   Anion  gap 10 5 - 15    Comment: Performed at [facility], [place] [address], [place], [place] [address]  Magnesium     Status: Abnormal   Collection Time: 05/07/18  3:48 AM  Result Value Ref Range   Magnesium 1.5 (L) 1.7 - 2.4 mg/dL    Comment: Performed at [facility] [address] [place], [place] [address]  Phosphorus     Status: Abnormal   Collection Time: 05/07/18  3:48 AM  Result Value Ref Range   Phosphorus 4.7 (H) 2.5 - 4.6 mg/dL    Comment: Performed at [facility] [address], [place], [place] [address]  TSH     Status: Abnormal   Collection Time: 05/07/18  3:48 AM  Result Value Ref Range  TSH 7.944 (H) 0.350 - 4.500 uIU/mL    Comment: Performed by a 3rd Generation assay with a functional sensitivity of <=0.01 uIU/mL. Performed at [facility], [place] [address] [place], [place] [address]   Glucose, capillary     Status: Abnormal   Collection Time: 05/07/18  4:32 AM  Result Value Ref Range   Glucose-Capillary 104 (H) 70 - 99 mg/dL  Glucose, capillary     Status: Abnormal   Collection Time: 05/07/18  8:25 AM  Result Value Ref Range   Glucose-Capillary 155 (H) 70 - 99 mg/dL  Prepare RBC     Status: None   Collection Time: 05/07/18 12:21 PM  Result Value Ref Range   Order Confirmation      ORDER PROCESSED BY BLOOD BANK Performed at [facility], [place] [address] [place], [place] [address]   Glucose, capillary     Status: Abnormal   Collection Time: 05/07/18 12:31 PM  Result Value Ref Range   Glucose-Capillary 183 (H) 70 - 99 mg/dL  Glucose, capillary     Status: Abnormal   Collection Time: 05/07/18  2:32 PM  Result Value Ref Range   Glucose-Capillary 200 (H) 70 - 99 mg/dL    MICRO: RARE CLOSTRIDIUM CLOSTRIDIOFORME  RARE PEPTOSTREPTOCOCCUS ASACCHAROLYTICUS C.tropicalis  IMAGING: Ct Abdomen Pelvis W Contrast  Result Date: 05/07/2018 CLINICAL DATA:  Abdominal pain, fever, abscess suspected. Partial bowel obstruction 8 days ago.  EXAM: CT ABDOMEN AND PELVIS WITH CONTRAST TECHNIQUE: Multidetector CT imaging of the abdomen and pelvis was performed using the standard protocol following bolus administration of intravenous contrast. CONTRAST:  123m OMNIPAQUE IOHEXOL 300 MG/ML  SOLN COMPARISON:  CTA abdomen pelvis 04/27/2018 FINDINGS: Lower chest: Dependent airspace disease is worse right than left. Calcified granulomas are present at the left base. No other significant airspace disease is present. Heart size is normal. No significant pleural or pericardial effusion is present. Hepatobiliary: There is diffuse fatty infiltration of liver. Cholecystectomy is noted. No focal hepatic lesions are present. The common bile duct is within normal limits. Pancreas: The pancreas is atrophic. No discrete lesions are present Spleen: Normal in size without focal abnormality. Adrenals/Urinary Tract: Adrenal glands are normal bilaterally. A 3 mm nonobstructing stone is stable at the lower pole of the right kidney. No other significant stones are present. There is no mass lesion. Minimal fluid is present about both kidneys. Ureters are within normal limits. The urinary bladder is within normal limits. Stomach/Bowel: The stomach and duodenum are within normal limits. The proximal small bowel demonstrates some inflammatory change. There is fluid within the small bowel mesentery in some wall thickening. Right lower quadrant ileostomy demonstrates contrast material. No obstruction is present. The terminal ileum is decompressed. Cecum and ascending colon are mostly decompressed. There are some diverticular changes within the transverse colon. Additional diverticular changes are present in the descending and sigmoid colon. Sigmoid colon is mostly collapsed. A pigtail drainage catheter in the right lower quadrant is stable. There is no significant fluid collection associated. A straight drain extends into the anatomic pelvis via a left lower quadrant approach. Enlarging  fluid collection along the lateral peritoneal wall and the left lower quadrant now measures 5.2 x 2.0 x 2.6 cm. A fluid collection along the distal duodenum is increased in size, now measuring 3.5 x 2.2 x 5.0 cm. The collection along the pharynx limb of the loop ileostomy in the right lower quadrant measures 2.0 by 3.2 x 7.1 cm. There is some free fluid  without other focal collections. Vascular/Lymphatic: Atherosclerotic calcifications are present in the aorta and branch vessels without aneurysm. No significant adenopathy is present. Reproductive: Uterus and bilateral adnexa are unremarkable. Other: Laparotomy wound is healing by secondary intention. The deep portion of the wound is closed. No significant ventral hernias are present. Fluid is described above. Musculoskeletal: Degenerative anterolisthesis is again noted at L4-5. Multilevel facet degenerative changes are noted. Vertebral body heights are normal. Hips are located and within normal limits. Pelvis is unremarkable. IMPRESSION: 1. 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 Diverticular changes throughout decompressed bowel without definite focal inflammation. There is some wall thickening in the sigmoid colon. 2. Existing drainage catheters are not associated with any significant residual fluid collections. 3. Three separate fluid collections are increasing, as described. 4.  Aortic Atherosclerosis (ICD10-I70.0). 5. Bibasilar airspace disease is likely atelectasis. Electronically Signed   By: San Morelle M.D.   On: 05/07/2018 13:54    Assessment/Plan: 72yo F with intra-abdominal infection as result of perforated diverticuli s/p exlap and ileostomy with drains in areas of accumulated fluid collections from 11/27  - given mildly hypotensive and worsening leukocytosis, would start her on piptazo plus fluconazole  to cover previous bacteria found. - repeat CT images suggests recurrence of fluid collection, in new areas? May  need drain repositioning  - if getting new drains, please has fluid sent for culture - if unable to reposition, concern that she will succumb to this process since she has been on abtx persistently since admission and intra-abdominal process still worsening/recurrence despite recent surgical intervention -consider goals of care discussion with family   Dr Lucianne Lei dam available this week/weekend Elzie Rings. [facility] for Infectious Diseases [phone]

## 2018-05-07 NOTE — Progress Notes (Signed)
[facility] Progress Note  7 Days Post-Op  Subjective: CC:  C/o incisional pain. Denies CP, SOB, nausea, or vomiting. Reports hx stomach "scarring" from gastric ulcers.  Objective: Vital signs in last 24 hours: Temp:  [98.4 F (36.9 C)-98.6 F (37 C)] 98.6 F (37 C) (11/26 2053) Pulse Rate:  [59-125] 118 (11/27 0432) Resp:  [18] 18 (11/27 0432) BP: (84-159)/(53-107) 135/107 (11/27 0603) SpO2:  [88 %-93 %] 93 % (11/27 0432) Last BM Date: (ileostomy)  Intake/Output from previous day: 11/26 0701 - 11/27 0700 In: 270 [P.O.:240; I.V.:30] Out: 1045 [Urine:1000; Drains:45] Intake/Output this shift: No intake/output data recorded.  PE: Gen:  Alert, NAD, pleasant Abd: Soft, appropriately tender, +BS, ileostomy with maroon stool in pouch, LUQ drainage now darker in color. Skin: warm and dry, no rashes  Psych: A&Ox3   Lab Results:  Recent Labs    05/06/18 0406 05/07/18 0348  WBC 15.0* 19.1*  HGB 7.4* 7.7*  HCT 23.3* 24.7*  PLT 254 334   BMET Recent Labs    05/06/18 0406 05/07/18 0348  NA 143 141  K 4.0 3.9  CL 102 101  CO2 31 30  GLUCOSE 113* 121*  BUN 50* 48*  CREATININE 1.14* 1.12*  CALCIUM 8.1* 7.9*   PT/INR No results for input(s): LABPROT, INR in the last 72 hours. CMP     Component Value Date/Time   NA 141 05/07/2018 0348   K 3.9 05/07/2018 0348   CL 101 05/07/2018 0348   CO2 30 05/07/2018 0348   GLUCOSE 121 (H) 05/07/2018 0348   BUN 48 (H) 05/07/2018 0348   CREATININE 1.12 (H) 05/07/2018 0348   CALCIUM 7.9 (L) 05/07/2018 0348   PROT 5.0 (L) 05/07/2018 0348   ALBUMIN 1.8 (L) 05/07/2018 0348   AST 44 (H) 05/07/2018 0348   ALT 42 05/07/2018 0348   ALKPHOS 101 05/07/2018 0348   BILITOT 0.6 05/07/2018 0348   GFRNONAA 49 (L) 05/07/2018 0348   GFRAA 56 (L) 05/07/2018 0348   Lipase  No results found for: LIPASE     Studies/Results: No results found.  Anti-infectives: Anti-infectives (From admission, onward)   Start      Dose/Rate Route Frequency Ordered Stop   05/05/18 1245  meropenem (MERREM) 1 g in sodium chloride 0.9 % 100 mL IVPB  Status:  Discontinued     1 g 200 mL/hr over 30 Minutes Intravenous Every 8 hours 05/05/18 1245 05/06/18 1006   05/01/18 1600  meropenem (MERREM) 1 g in sodium chloride 0.9 % 100 mL IVPB  Status:  Discontinued     1 g 200 mL/hr over 30 Minutes Intravenous Every 8 hours 05/01/18 1539 05/05/18 1243   04/30/18 1215  fluconazole (DIFLUCAN) IVPB 400 mg  Status:  Discontinued     400 mg 100 mL/hr over 120 Minutes Intravenous To Surgery 04/30/18 1200 05/01/18 1215   04/29/18 1100  fluconazole (DIFLUCAN) IVPB 400 mg     400 mg 100 mL/hr over 120 Minutes Intravenous Every 24 hours 04/29/18 1006 05/26/18 2359   04/27/18 1500  meropenem (MERREM) 1 g in sodium chloride 0.9 % 100 mL IVPB  Status:  Discontinued     1 g 200 mL/hr over 30 Minutes Intravenous Every 12 hours 04/27/18 1008 05/01/18 1539   04/26/18 1300  metroNIDAZOLE (FLAGYL) IVPB 500 mg  Status:  Discontinued     500 mg 100 mL/hr over 60 Minutes Intravenous Every 6 hours 04/26/18 1257 04/27/18 1041   04/15/18 1700  meropenem (MERREM) 1  g in sodium chloride 0.9 % 100 mL IVPB  Status:  Discontinued     1 g 200 mL/hr over 30 Minutes Intravenous Every 8 hours 04/15/18 1534 04/27/18 1008   04/09/18 2200  meropenem (MERREM) 1 g in sodium chloride 0.9 % 100 mL IVPB  Status:  Discontinued     1 g 200 mL/hr over 30 Minutes Intravenous Every 8 hours 04/09/18 1244 04/14/18 0944   04/06/18 1300  meropenem (MERREM) 1 g in sodium chloride 0.9 % 100 mL IVPB  Status:  Discontinued     1 g 200 mL/hr over 30 Minutes Intravenous Every 12 hours 04/06/18 1145 04/09/18 1244   04/06/18 1145  meropenem (MERREM) 1 g in sodium chloride 0.9 % 100 mL IVPB  Status:  Discontinued     1 g 200 mL/hr over 30 Minutes Intravenous Every 8 hours 04/06/18 1139 04/06/18 1145   04/06/18 0900  ciprofloxacin (CIPRO) IVPB 400 mg  Status:  Discontinued     400  mg 200 mL/hr over 60 Minutes Intravenous Every 12 hours 04/06/18 0732 04/06/18 1139   04/06/18 0300  metroNIDAZOLE (FLAGYL) IVPB 500 mg  Status:  Discontinued     500 mg 100 mL/hr over 60 Minutes Intravenous Every 8 hours 04/05/18 2335 04/06/18 1139   04/05/18 1930  ciprofloxacin (CIPRO) IVPB 400 mg     400 mg 200 mL/hr over 60 Minutes Intravenous  Once 04/05/18 1925 04/05/18 2217   04/05/18 1930  metroNIDAZOLE (FLAGYL) IVPB 500 mg     500 mg 100 mL/hr over 60 Minutes Intravenous  Once 04/05/18 1925 04/05/18 2102     Assessment/Plan HLD 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 - on plavix (last dose 10/27) H/o CVA  Morbidly obese Chronic pain RUE DVT -was onIV heparin, now treatment dose lovenox - lovenox held 11/26 due to below Acute on chronic anemia - hgb improved today 7.7, follow   Acute perforated sigmoid diverticulitis with multiple abscesses -s/pIR perc drains x 2 - 04/16/18, culture with multiple organisms present/none predominant -S/p3rdIR drain 11/13, culturegrowingFEW CANDIDA TROPICALIS  S/p Exploratory laparotomy, extensive lysis of adhesions, small bowel resection, loop ileostomy, drainage of multiple intra-abdominal abscesses, placement of wound VAC11/20/19 Dr. Georgette Dover - POD#7,path negativefor malignancy - afebrile, WBC trending up (19 today from 15) - dark stool in pouch but appears slightly less bloody compared to yesterday, hgb stable today  - midline vac MWF - single IR drain remains, and 2 JP drains placed intraoperatively -advance to SOFT diet  -patient needs to mobilize, increase PO pain control.   ID -merrem 10/27-11/26, flagyl 11/16>>11/17; WBC trending up 13>15>19; afebrile, some tachycardia, will repeat CT abd/pelvis today to r/o post-op abcess. FEN -SOFT diet, start to wean TNA. Prealbumin19.4 , ensure Foley - external cath  Plan- Repeat CT today, SOFT diet,PO pain control   LOS: 30 days    [name], [facility] Pager: [phone]

## 2018-05-08 ENCOUNTER — Inpatient Hospital Stay (HOSPITAL_COMMUNITY): Payer: Medicare Other

## 2018-05-08 DIAGNOSIS — I82409 Acute embolism and thrombosis of unspecified deep veins of unspecified lower extremity: Secondary | ICD-10-CM

## 2018-05-08 LAB — BASIC METABOLIC PANEL
Anion gap: 11 (ref 5–15)
BUN: 49 mg/dL — AB (ref 8–23)
CHLORIDE: 99 mmol/L (ref 98–111)
CO2: 28 mmol/L (ref 22–32)
Calcium: 7.9 mg/dL — ABNORMAL LOW (ref 8.9–10.3)
Creatinine, Ser: 1.18 mg/dL — ABNORMAL HIGH (ref 0.44–1.00)
GFR calc Af Amer: 53 mL/min — ABNORMAL LOW (ref >60)
GFR calc non Af Amer: 46 mL/min — ABNORMAL LOW (ref >60)
Glucose, Bld: 121 mg/dL — ABNORMAL HIGH (ref 70–99)
POTASSIUM: 3.6 mmol/L (ref 3.5–5.1)
SODIUM: 138 mmol/L (ref 135–145)

## 2018-05-08 LAB — TYPE AND SCREEN
ABO/RH(D): O POS
ANTIBODY SCREEN: NEGATIVE
Unit division: 0

## 2018-05-08 LAB — GLUCOSE, CAPILLARY
GLUCOSE-CAPILLARY: 114 mg/dL — AB (ref 70–99)
GLUCOSE-CAPILLARY: 94 mg/dL (ref 70–99)
Glucose-Capillary: 125 mg/dL — ABNORMAL HIGH (ref 70–99)
Glucose-Capillary: 107 mg/dL — ABNORMAL HIGH (ref 70–99)

## 2018-05-08 LAB — CBC
HEMATOCRIT: 25.6 % — AB (ref 36.0–46.0)
HEMOGLOBIN: 8.1 g/dL — AB (ref 12.0–15.0)
MCH: 28.5 pg (ref 26.0–34.0)
MCHC: 31.6 g/dL (ref 30.0–36.0)
MCV: 90.1 fL (ref 80.0–100.0)
Platelets: 333 10*3/uL (ref 150–400)
RBC: 2.84 MIL/uL — ABNORMAL LOW (ref 3.87–5.11)
RDW: 17.1 % — ABNORMAL HIGH (ref 11.5–15.5)
WBC: 19.8 10*3/uL — ABNORMAL HIGH (ref 4.0–10.5)
nRBC: 0.9 % — ABNORMAL HIGH (ref 0.0–0.2)

## 2018-05-08 LAB — BPAM RBC
Blood Product Expiration Date: 201912252359
ISSUE DATE / TIME: 201911271418
UNIT TYPE AND RH: 5100

## 2018-05-08 LAB — MAGNESIUM: Magnesium: 1.9 mg/dL (ref 1.7–2.4)

## 2018-05-08 MED ORDER — LACTATED RINGERS IV SOLN: INTRAVENOUS | Status: DC

## 2018-05-08 MED ORDER — FERROUS SULFATE 325 (65 FE) MG PO TABS
325.0000 mg | ORAL_TABLET | Freq: Two times a day (BID) | ORAL | Status: DC
  Administered 2018-05-08 – 2018-05-13 (×12): 325 mg via ORAL
  Filled 2018-05-08 (×12): qty 1

## 2018-05-08 MED ORDER — FOLIC ACID 1 MG PO TABS
1.0000 mg | ORAL_TABLET | Freq: Every day | ORAL | Status: DC
  Administered 2018-05-08 – 2018-05-13 (×6): 1 mg via ORAL
  Filled 2018-05-08 (×6): qty 1

## 2018-05-08 MED ORDER — PIPERACILLIN-TAZOBACTAM 3.375 G IVPB
3.3750 g | Freq: Three times a day (TID) | INTRAVENOUS | Status: DC
  Administered 2018-05-08 – 2018-05-13 (×16): 3.375 g via INTRAVENOUS
  Filled 2018-05-08 (×15): qty 50

## 2018-05-08 MED ORDER — DIGOXIN 0.25 MG/ML IJ SOLN
0.2500 mg | Freq: Four times a day (QID) | INTRAMUSCULAR | Status: AC
  Administered 2018-05-08 (×2): 0.25 mg via INTRAVENOUS
  Filled 2018-05-08 (×2): qty 2

## 2018-05-08 MED ORDER — DIGOXIN 125 MCG PO TABS
0.2500 mg | ORAL_TABLET | Freq: Every day | ORAL | Status: DC
  Administered 2018-05-09 – 2018-05-13 (×5): 0.25 mg via ORAL
  Filled 2018-05-08 (×5): qty 2

## 2018-05-08 MED ORDER — DILTIAZEM HCL 60 MG PO TABS
60.0000 mg | ORAL_TABLET | Freq: Three times a day (TID) | ORAL | Status: DC
  Administered 2018-05-08 – 2018-05-13 (×16): 60 mg via ORAL
  Filled 2018-05-08 (×16): qty 1

## 2018-05-08 NOTE — Progress Notes (Signed)
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 Georgette Dover - path negativefor malignancy - afebrile, WBC trending up (19.8 today)  - midline vac MWF - single IR drain remains, and 2 JP drains placed intraoperatively  -patient needs to mobilize, increase PO pain control. - CT yesterday showed 3 new fluid collections that IR is not able to drain - ID involved and recommends Zosyn and fluconazole, I agree with the recommendations since IR is unable to drain intra-abdominal fluid collections seen on CT yesterday  ID -merrem 10/27-11/26, flagyl 11/16>>11/17; Zosyn 11/28>> FEN -SOFT diet, start to wean TNA. Prealbumin19.4 , ensure Foley - external cath  Plan- PO pain control, Zosyn per ID.    LOS: 31 days    Subjective: CC: abdominal pain  Mild abdominal pain. No issues overnight. Pt is drinking protein shakes. Husband at bedside. Discussed restarting IV abx. IR unable to place drains.   Objective: Vital signs in last 24 hours: Temp:  [97.7 F (36.5 C)-98.6 F (37 C)] 98.1 F (36.7 C) (11/28 0540) Pulse Rate:  [75-128] 128 (11/28 0540) Resp:  [16-20] 18 (11/28 0540) BP: (101-127)/(49-78) 101/66 (11/28 0540) SpO2:  [92 %-96 %] 92 % (11/28 0540) Last BM Date: (ileostomy)  Intake/Output from previous day: 11/27  0701 - 11/28 0700 In: 320 [Blood:315] Out: 2300 [Urine:1950; Drains:50; Stool:300] Intake/Output this shift: Total I/O In: -  Out: 600 [Urine:600]  PE: Gen:  Alert, NAD, pleasant, cooperative Pulm:  Rate and effort normal Abd: Soft, obese, ND, +BS, midline with vac in place, R drain with yellow cloudy drainage, L sided surgical drains x 2 (1 with serosanguinous drainage, one with dark brown cloudy drainage) mild generalized TTP without guarding, no peritonitis  Skin: no rashes noted, warm and dry   Anti-infectives: Anti-infectives (From admission, onward)   Start     Dose/Rate Route Frequency Ordered Stop   05/08/18 0815  piperacillin-tazobactam (ZOSYN) IVPB 3.375 g     3.375 g 12.5 mL/hr over 240 Minutes Intravenous Every 8 hours 05/08/18 0801     05/05/18 1245  meropenem (MERREM) 1 g in sodium chloride 0.9 % 100 mL IVPB  Status:  Discontinued     1 g 200 mL/hr over 30 Minutes Intravenous Every 8 hours 05/05/18 1245 05/06/18 1006   05/01/18 1600  meropenem (MERREM) 1 g in sodium chloride 0.9 % 100 mL IVPB  Status:  Discontinued     1 g 200 mL/hr over 30 Minutes Intravenous Every 8 hours 05/01/18 1539 05/05/18 1243   04/30/18 1215  fluconazole (DIFLUCAN) IVPB 400 mg  Status:  Discontinued     400 mg 100 mL/hr over 120 Minutes Intravenous To Surgery 04/30/18 1200 05/01/18 1215   04/29/18 1100  fluconazole (DIFLUCAN) IVPB 400 mg     400 mg 100 mL/hr over 120 Minutes Intravenous Every 24 hours 04/29/18 1006 05/27/18 0959   04/27/18 1500  meropenem (MERREM) 1 g in sodium  chloride 0.9 % 100 mL IVPB  Status:  Discontinued     1 g 200 mL/hr over 30 Minutes Intravenous Every 12 hours 04/27/18 1008 05/01/18 1539   04/26/18 1300  metroNIDAZOLE (FLAGYL) IVPB 500 mg  Status:  Discontinued     500 mg 100 mL/hr over 60 Minutes Intravenous Every 6 hours 04/26/18 1257 04/27/18 1041   04/15/18 1700  meropenem (MERREM) 1 g in sodium chloride 0.9 % 100 mL IVPB  Status:  Discontinued     1 g 200  mL/hr over 30 Minutes Intravenous Every 8 hours 04/15/18 1534 04/27/18 1008   04/09/18 2200  meropenem (MERREM) 1 g in sodium chloride 0.9 % 100 mL IVPB  Status:  Discontinued     1 g 200 mL/hr over 30 Minutes Intravenous Every 8 hours 04/09/18 1244 04/14/18 0944   04/06/18 1300  meropenem (MERREM) 1 g in sodium chloride 0.9 % 100 mL IVPB  Status:  Discontinued     1 g 200 mL/hr over 30 Minutes Intravenous Every 12 hours 04/06/18 1145 04/09/18 1244   04/06/18 1145  meropenem (MERREM) 1 g in sodium chloride 0.9 % 100 mL IVPB  Status:  Discontinued     1 g 200 mL/hr over 30 Minutes Intravenous Every 8 hours 04/06/18 1139 04/06/18 1145   04/06/18 0900  ciprofloxacin (CIPRO) IVPB 400 mg  Status:  Discontinued     400 mg 200 mL/hr over 60 Minutes Intravenous Every 12 hours 04/06/18 0732 04/06/18 1139   04/06/18 0300  metroNIDAZOLE (FLAGYL) IVPB 500 mg  Status:  Discontinued     500 mg 100 mL/hr over 60 Minutes Intravenous Every 8 hours 04/05/18 2335 04/06/18 1139   04/05/18 1930  ciprofloxacin (CIPRO) IVPB 400 mg     400 mg 200 mL/hr over 60 Minutes Intravenous  Once 04/05/18 1925 04/05/18 2217   04/05/18 1930  metroNIDAZOLE (FLAGYL) IVPB 500 mg     500 mg 100 mL/hr over 60 Minutes Intravenous  Once 04/05/18 1925 04/05/18 2102      Lab Results:  Recent Labs    05/07/18 0348 05/07/18 2219 05/08/18 0349  WBC 19.1*  --  19.8*  HGB 7.7* 8.3* 8.1*  HCT 24.7* 25.1* 25.6*  PLT 334  --  333   BMET Recent Labs    05/07/18 0348 05/08/18 0349  NA 141 138  K 3.9 3.6  CL 101 99  CO2 30 28  GLUCOSE 121* 121*  BUN 48* 49*  CREATININE 1.12* 1.18*  CALCIUM 7.9* 7.9*   PT/INR No results for input(s): LABPROT, INR in the last 72 hours. CMP     Component Value Date/Time   NA 138 05/08/2018 0349   K 3.6 05/08/2018 0349   CL 99 05/08/2018 0349   CO2 28 05/08/2018 0349   GLUCOSE 121 (H) 05/08/2018 0349   BUN 49 (H) 05/08/2018 0349   CREATININE 1.18 (H) 05/08/2018 0349   CALCIUM 7.9  (L) 05/08/2018 0349   PROT 5.0 (L) 05/07/2018 0348   ALBUMIN 1.8 (L) 05/07/2018 0348   AST 44 (H) 05/07/2018 0348   ALT 42 05/07/2018 0348   ALKPHOS 101 05/07/2018 0348   BILITOT 0.6 05/07/2018 0348   GFRNONAA 46 (L) 05/08/2018 0349   GFRAA 53 (L) 05/08/2018 0349   Lipase  No results found for: LIPASE  Studies/Results: Ct Abdomen Pelvis W Contrast  Result Date: 05/07/2018 CLINICAL DATA:  Abdominal pain, fever, abscess suspected. Partial bowel obstruction 8 days ago. EXAM: CT ABDOMEN AND PELVIS WITH  CONTRAST TECHNIQUE: Multidetector CT imaging of the abdomen and pelvis was performed using the standard protocol following bolus administration of intravenous contrast. CONTRAST:  149mL OMNIPAQUE IOHEXOL 300 MG/ML  SOLN COMPARISON:  CTA abdomen pelvis 04/27/2018 FINDINGS: Lower chest: Dependent airspace disease is worse right than left. Calcified granulomas are present at the left base. No other significant airspace disease is present. Heart size is normal. No significant pleural or pericardial effusion is present. Hepatobiliary: There is diffuse fatty infiltration of liver. Cholecystectomy is noted. No focal hepatic lesions are present. The common bile duct is within normal limits. Pancreas: The pancreas is atrophic. No discrete lesions are present Spleen: Normal in size without focal abnormality. Adrenals/Urinary Tract: Adrenal glands are normal bilaterally. A 3 mm nonobstructing stone is stable at the lower pole of the right kidney. No other significant stones are present. There is no mass lesion. Minimal fluid is present about both kidneys. Ureters are within normal limits. The urinary bladder is within normal limits. Stomach/Bowel: The stomach and duodenum are within normal limits. The proximal small bowel demonstrates some inflammatory change. There is fluid within the small bowel mesentery in some wall thickening. Right lower quadrant ileostomy demonstrates contrast material. No obstruction is  present. The terminal ileum is decompressed. Cecum and ascending colon are mostly decompressed. There are some diverticular changes within the transverse colon. Additional diverticular changes are present in the descending and sigmoid colon. Sigmoid colon is mostly collapsed. A pigtail drainage catheter in the right lower quadrant is stable. There is no significant fluid collection associated. A straight drain extends into the anatomic pelvis via a left lower quadrant approach. Enlarging fluid collection along the lateral peritoneal wall and the left lower quadrant now measures 5.2 x 2.0 x 2.6 cm. A fluid collection along the distal duodenum is increased in size, now measuring 3.5 x 2.2 x 5.0 cm. The collection along the pharynx limb of the loop ileostomy in the right lower quadrant measures 2.0 by 3.2 x 7.1 cm. There is some free fluid without other focal collections. Vascular/Lymphatic: Atherosclerotic calcifications are present in the aorta and branch vessels without aneurysm. No significant adenopathy is present. Reproductive: Uterus and bilateral adnexa are unremarkable. Other: Laparotomy wound is healing by secondary intention. The deep portion of the wound is closed. No significant ventral hernias are present. Fluid is described above. Musculoskeletal: Degenerative anterolisthesis is again noted at L4-5. Multilevel facet degenerative changes are noted. Vertebral body heights are normal. Hips are located and within normal limits. Pelvis is unremarkable. IMPRESSION: 1. 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 Diverticular changes throughout decompressed bowel without definite focal inflammation. There is some wall thickening in the sigmoid colon. 2. Existing drainage catheters are not associated with any significant residual fluid collections. 3. Three separate fluid collections are increasing, as described. 4.  Aortic Atherosclerosis (ICD10-I70.0). 5. Bibasilar airspace disease is likely  atelectasis. Electronically Signed   By: San Morelle M.D.   On: 05/07/2018 13:54      Kalman Drape , [facility] 05/08/2018, 10:08 AM  Pager: [phone] Mon-Wed, Friday 7:00am-4:30pm Thurs 7am-11:30am  Consults: [phone]

## 2018-05-08 NOTE — Progress Notes (Signed)
VASCULAR LAB PRELIMINARY  PRELIMINARY  PRELIMINARY  PRELIMINARY  Right upper extremity venous duplex completed.    Preliminary report:  The DVT found in one of the brachial veins 04/15/18, remains.  There is now propagation of the DVT into the right axillary vein.  Elon Lomeli, RVT 05/08/2018, 1:32 PM

## 2018-05-08 NOTE — Progress Notes (Signed)
Pharmacy Antibiotic Note  [name] is a 72 y.o. female with diverticulitis and perforation s/p mutiple drains.  Pharmacy has been consulted for Zosyn dosing.  Noted h/o rash with PCN, discussed with Dr. Candiss Norse who talked to patient about her allergy.  MD is OK continuing with Zosyn.    Plan: Zosyn 3.375 g IV q8h  Height: 5' 5.5" (166.4 cm) Weight: 260 lb 12.9 oz (118.3 kg) IBW/kg (Calculated) : 58.15  Temp (24hrs), Avg:98.2 F (36.8 C), Min:97.7 F (36.5 C), Max:98.6 F (37 C)  Recent Labs  Lab 05/04/18 0348 05/05/18 0351 05/06/18 0406 05/07/18 0348 05/08/18 0349  WBC 12.3* 13.1* 15.0* 19.1* 19.8*  CREATININE 0.91 0.88 1.14* 1.12* 1.18*    Estimated Creatinine Clearance: 55.1 mL/min (A) (by C-G formula based on SCr of 1.18 mg/dL (H)).    Allergies  Allergen Reactions  . Lactose Intolerance (Gi) Other (See Comments)    G.I. Upset  . Butrans [Buprenorphine] Rash and Other (See Comments)    Infected skin underneath application  . Penicillins Rash    Facial rash Has patient had a PCN reaction causing immediate rash, facial/tongue/throat swelling, SOB or lightheadedness with hypotension: Yes Has patient had a PCN reaction causing severe rash involving mucus membranes or skin necrosis: No Has patient had a PCN reaction that required hospitalization No Has patient had a PCN reaction occurring within the last 10 years: Yes If all of the above answers are "NO", then may proceed with Cephalosporin use.   . Simvastatin Rash   Caryl Pina 05/08/2018 8:02 AM

## 2018-05-08 NOTE — Plan of Care (Signed)
  Problem: Education: Goal: Knowledge of General Education information will improve Description: Including pain rating scale, medication(s)/side effects and non-pharmacologic comfort measures Outcome: Progressing   Problem: Safety: Goal: Ability to remain free from injury will improve Outcome: Progressing   

## 2018-05-08 NOTE — Progress Notes (Signed)
PROGRESS NOTE    [name]  BSJ:[id number] DOB: [birth year] DOA: 04/05/2018 PCP: [name], MD    Brief Narrative:   72 year old female with past medical history of hypertension, type 2 diabetes mellitus chronic kidney disease rheumatoid arthritis coronary artery disease S/P CABG on Plavix who was admitted with abdominal pain and found to have sigmoid diverticulitis on April 05, 2018 with perforation and abscess.  Initially was felt to be improving on IV Merrem but she continued to have abdominal pain. CT scan of the abdomen and pelvis was obtained April 15, 2018 and was significant for worsening diverticulitis with abdominal fluid collections, she was transferred to [facility] for IR evaluation, he did receive total of 3 percutaneous drains, with repeat CT abdomen pelvis 04/27/2018, still showing remaining fluid collection/abscess despite he strains, and appropriate antibiotic coverage, patient went for exploratory laparotomy with lysis of adhesion, small bowel resection with loop ileostomy, and placement of wound VAC by Dr. Georgette Dover 04/30/2018.  SUBJECTIVE:  Patient in bed, appears comfortable, denies any headache, no fever, no chest pain or pressure, no shortness of breath , +ve abdominal pain. No focal weakness.    Assessment & Plan:    Perforated sigmoid diverticulitis/abdominal abscess : General surgery following, initially managed conservatively with multiple abdominal drain placements however repeat CT scan on 04/27/2018 showed worsening of abscess and disease process after which she was taken to the OR for laparotomy with lysis of adhesion, small bowel resection, loop ileostomy and placement of wound VAC by Dr. Georgette Dover on 04/30/2018.  Abscess cultures noted which are growing Candida, Clostridium on with Peptostreptococcus.  Has already finished 4 weeks of meropenem on 05/06/2018.  At this time ID and general surgery will following, repeat CT scan on 05/07/2018 shows multiple new  collections of abscess in the abdomen, defer management to ID and general surgery likely antibiotics will be reinitiated, Zosyn has been reordered on 05/08/2018 upon ID request, Diflucan continued.  Diflucan start date was 04/29/2018.Marland Kitchen    Acute right upper extremity DVT extremity  -  due to PICC line this was just discontinued April 16, 2018 still swollen but improving there is no warmth or redness.  Was kept on full dose anticoagulation from 04/16/2018 till 05/06/2018, currently full dose anticoagulation held due to anemia below.  Will repeat venous duplex ultrasound on 05/08/2028 to look at the status and need for full anticoagulation.  Anemia of critical illness.  Some element of dilution but her ileostomy output appears dark, despite holding PNA and diuresing her with Lasix her H&H is going down suggesting some ongoing blood loss, continue IV PPI and hold Lovenox on 05/06/2018 until ileostomy output clears.  Goal hemoglobin around 7.5.  Type screen done, will give 1 unit on 05/07/18 to improve BP and H rate.  Anemia panel inconclusive.  Acute kidney injury with chronic kidney disease stage III  - recent creatinine of 1.2, ARF resolved currently close to baseline.  Fibromyalgia - continue Lyrica able to take oral  Hypertension improved.  - BP soft, low dose B Blocker.  Advanced weakness and deconditioning -   PT has been consulted they recommend SNF.  Patient encouraged to sit up in the chair rather than laying in bed.  Counseled patient and husband multiple times including 05/07/2018, 05/08/2018.  Acute illness required anemia on chronic anemia  - she required total of 4 units PRBC transfusion during hospital stay .monitor closely and transfuse as needed .  Depression patient recently started on Lexapro  Paroxysmal  atrial tachycardia.  She has not had any more episodes of PAT since that of April 22, 2018 echocardiogram that was done during showed preserved ejection fraction she has stable  TSH, on low-dose Cardizem her blood pressure too low to adjust further, will give her loading dose of digoxin thereafter patient is on oral digoxin on 05/08/2018.  Monitor heart rate.  Hypomagnesemia - replaced monitor  Type 2 diabetes mellitus controlled  - Continue with insulin sliding scale every 4 hours as she is on TPN, CBG acceptable .  CBG (last 3)  Recent Labs    05/07/18 1819 05/07/18 2124 05/08/18 0747  GLUCAP 189* 146* 114*       DVT prophylaxis: Subcutaneous Lovenox treatment dose for right upper extremity DVT  code Status: Full Family Communication: family at bedside  05/04/18 Disposition Plan: Physical therapy recommended SNF Consultants:   General surgery  IR  ID - phone Dr Johnnye Sima 05/05/18  Procedures:  - Echocardiogram -   Left ventricle: The cavity size was normal. Wall thickness was increased in a pattern of moderate LVH. Systolic function was  vigorous. The estimated ejection fraction was in the range of 65% to 70%. Indeterminant diastolic function. Wall motion was normal; there were no regional wall motion abnormalities. - Aortic valve: Valve area (VTI): 2.74 cm^2. Valve area (Vmax):   2.58 cm^2. Valve area (Vmean): 2.56 cm^2.  -S/PIR perc drains x 2 - 04/16/18, culture with multiple organisms present/none predominant -S/P3rdIR drain 11/13, culturegrowingFEW CANDIDA TROPICALIS, - Right arm PICC line - had DVT and was remioved -  Left arm PICC line - Exploratory laparotomy with lysis of adhesion, small bowel resection with loop ileostomy, and placement of wound VAC by Dr. Georgette Dover 04/30/2018. - CT 11/27 - 1. 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 Diverticular changes throughout decompressed bowel without definite focal inflammation. There is some wall thickening in the sigmoid colon. 2. Existing drainage catheters are not associated with any significant residual fluid collections. 3. Three separate fluid collections are increasing,  as described. 4.  Aortic Atherosclerosis (ICD10-I70.0). 5. Bibasilar airspace disease is likely atelectasis.    Objective: Vitals:   05/07/18 1752 05/07/18 2124 05/07/18 2124 05/08/18 0540  BP: (!) 116/53 127/78 127/78 101/66  Pulse: (!) 110 (!) 121 (!) 124 (!) 128  Resp: 20  16 18   Temp: 98.3 F (36.8 C)  98.3 F (36.8 C) 98.1 F (36.7 C)  TempSrc: Oral  Oral Oral  SpO2: 94%  93% 92%  Weight:      Height:        Intake/Output Summary (Last 24 hours) at 05/08/2018 1052 Last data filed at 05/08/2018 0934 Gross per 24 hour  Intake 415 ml  Output 2700 ml  Net -2285 ml   Filed Weights   04/19/18 0500 04/23/18 0500 04/24/18 0500  Weight: 123 kg 118.2 kg 118.3 kg    Examination:  Awake Alert, Oriented X 3, No new F.N deficits, Normal affect [place].AT,PERRAL Supple Neck,No JVD, No cervical lymphadenopathy appriciated.  Symmetrical Chest wall movement, Good air movement bilaterally, CTAB RRR,No Gallops, Rubs or new Murmurs, No Parasternal Heave +ve B.Sounds, midline surgical wound VAC, right ileostomyileostomy bag, left JP drain with sanguinous material . No Cyanosis, Clubbing or edema, No new Rash or bruise  Data Reviewed: I have personally reviewed following labs and imaging studies  CBC: Recent Labs  Lab 05/04/18 0348 05/05/18 0351 05/06/18 0406 05/07/18 0348 05/07/18 2219 05/08/18 0349  WBC 12.3* 13.1* 15.0* 19.1*  --  19.8*  NEUTROABS  --  10.7*  --   --   --   --   HGB 8.7* 8.0* 7.4* 7.7* 8.3* 8.1*  HCT 27.7* 24.8* 23.3* 24.7* 25.1* 25.6*  MCV 91.1 91.5 92.8 92.5  --  90.1  PLT 285 262 254 334  --  500   Basic Metabolic Panel: Recent Labs  Lab 05/02/18 0505  05/04/18 0348 05/05/18 0351 05/06/18 0406 05/07/18 0348 05/08/18 0349  NA 140   < > 139 142 143 141 138  K 4.4   < > 3.3* 3.6 4.0 3.9 3.6  CL 100   < > 96* 102 102 101 99  CO2 32   < > 33* 33* 31 30 28   GLUCOSE 177*   < > 199* 200* 113* 121* 121*  BUN 52*   < > 45* 40* 50* 48* 49*  CREATININE  1.06*   < > 0.91 0.88 1.14* 1.12* 1.18*  CALCIUM 8.5*   < > 8.0* 7.8* 8.1* 7.9* 7.9*  MG 1.9  --   --  1.4* 1.8 1.5* 1.9  PHOS  --   --   --  3.6 5.7* 4.7*  --    < > = values in this interval not displayed.   GFR: Estimated Creatinine Clearance: 55.1 mL/min (A) (by C-G formula based on SCr of 1.18 mg/dL (H)). Liver Function Tests: Recent Labs  Lab 05/05/18 0351 05/07/18 0348  AST 64* 44*  ALT 47* 42  ALKPHOS 93 101  BILITOT 0.4 0.6  PROT 4.9* 5.0*  ALBUMIN 1.7* 1.8*   No results for input(s): LIPASE, AMYLASE in the last 168 hours. No results for input(s): AMMONIA in the last 168 hours. Coagulation Profile: No results for input(s): INR, PROTIME in the last 168 hours. Cardiac Enzymes: No results for input(s): CKTOTAL, CKMB, CKMBINDEX, TROPONINI in the last 168 hours. BNP (last 3 results) No results for input(s): PROBNP in the last 8760 hours. HbA1C: No results for input(s): HGBA1C in the last 72 hours. CBG: Recent Labs  Lab 05/07/18 1432 05/07/18 1621 05/07/18 1819 05/07/18 2124 05/08/18 0747  GLUCAP 200* 172* 189* 146* 114*   Lipid Profile: No results for input(s): CHOL, HDL, LDLCALC, TRIG, CHOLHDL, LDLDIRECT in the last 72 hours. Thyroid Function Tests: Recent Labs    05/07/18 0348  TSH 7.944*   Anemia Panel: Recent Labs    05/05/18 1211  VITAMINB12 466  FOLATE 6.9  FERRITIN 3,581*  TIBC 183*  IRON 45  RETICCTPCT 2.6   Urine analysis:    Component Value Date/Time   COLORURINE YELLOW 04/05/2018 1924   APPEARANCEUR CLEAR 04/05/2018 1924   LABSPEC 1.021 04/05/2018 1924   PHURINE 5.0 04/05/2018 1924   GLUCOSEU NEGATIVE 04/05/2018 1924   HGBUR NEGATIVE 04/05/2018 1924   BILIRUBINUR NEGATIVE 04/05/2018 [place] NEGATIVE 04/05/2018 1924   PROTEINUR NEGATIVE 04/05/2018 1924   UROBILINOGEN 0.2 06/20/2013 1832   NITRITE NEGATIVE 04/05/2018 1924   LEUKOCYTESUR NEGATIVE 04/05/2018 1924   Sepsis  Labs: @LABRCNTIP (procalcitonin:4,lacticidven:4)  ) Recent Results (from the past 240 hour(s))  C difficile quick scan w PCR reflex     Status: Abnormal   Collection Time: 04/28/18  9:01 PM  Result Value Ref Range Status   C Diff antigen POSITIVE (A) NEGATIVE Final   C Diff toxin NEGATIVE NEGATIVE Final   C Diff interpretation Results are indeterminate. See PCR results.  Final    Comment: Performed at [facility], [place] [address], [place], [place] [address]  C. Diff by PCR, Reflexed     Status: None   Collection Time: 04/28/18  9:01 PM  Result Value Ref Range Status   Toxigenic C. Difficile by PCR NEGATIVE NEGATIVE Final    Comment: Patient is colonized with non toxigenic C. difficile. May not need treatment unless significant symptoms are present. Performed at [facility], [place] [address], [place], [place] [address]   Surgical pcr screen     Status: None   Collection Time: 04/29/18  6:05 PM  Result Value Ref Range Status   MRSA, PCR NEGATIVE NEGATIVE Final   Staphylococcus aureus NEGATIVE NEGATIVE Final    Comment: (NOTE) The Xpert SA Assay (FDA approved for NASAL specimens in patients 86 years of age and older), is one component of a comprehensive surveillance program. It is not intended to diagnose infection nor to guide or monitor treatment. Performed at [facility], [place] [address], [place], [place] [address]   Aerobic Culture (superficial specimen)     Status: None (Preliminary result)   Collection Time: 05/07/18 10:43 AM  Result Value Ref Range Status   Specimen Description WOUND  Final   Special Requests NONE  Final   Gram Stain   Final    FEW WBC PRESENT, PREDOMINANTLY PMN FEW GRAM POSITIVE COCCI Performed at [facility], [place] [address], [place], [place] [address]    Culture PENDING  Incomplete   Report Status PENDING  Incomplete     Radiology Studies: Ct Abdomen Pelvis W Contrast  Result Date: 05/07/2018 CLINICAL DATA:  Abdominal  pain, fever, abscess suspected. Partial bowel obstruction 8 days ago. EXAM: CT ABDOMEN AND PELVIS WITH CONTRAST TECHNIQUE: Multidetector CT imaging of the abdomen and pelvis was performed using the standard protocol following bolus administration of intravenous contrast. CONTRAST:  149mL OMNIPAQUE IOHEXOL 300 MG/ML  SOLN COMPARISON:  CTA abdomen pelvis 04/27/2018 FINDINGS: Lower chest: Dependent airspace disease is worse right than left. Calcified granulomas are present at the left base. No other significant airspace disease is present. Heart size is normal. No significant pleural or pericardial effusion is present. Hepatobiliary: There is diffuse fatty infiltration of liver. Cholecystectomy is noted. No focal hepatic lesions are present. The common bile duct is within normal limits. Pancreas: The pancreas is atrophic. No discrete lesions are present Spleen: Normal in size without focal abnormality. Adrenals/Urinary Tract: Adrenal glands are normal bilaterally. A 3 mm nonobstructing stone is stable at the lower pole of the right kidney. No other significant stones are present. There is no mass lesion. Minimal fluid is present about both kidneys. Ureters are within normal limits. The urinary bladder is within normal limits. Stomach/Bowel: The stomach and duodenum are within normal limits. The proximal small bowel demonstrates some inflammatory change. There is fluid within the small bowel mesentery in some wall thickening. Right lower quadrant ileostomy demonstrates contrast material. No obstruction is present. The terminal ileum is decompressed. Cecum and ascending colon are mostly decompressed. There are some diverticular changes within the transverse colon. Additional diverticular changes are present in the descending and sigmoid colon. Sigmoid colon is mostly collapsed. A pigtail drainage catheter in the right lower quadrant is stable. There is no significant fluid collection associated. A straight drain extends  into the anatomic pelvis via a left lower quadrant approach. Enlarging fluid collection along the lateral peritoneal wall and the left lower quadrant now measures 5.2 x 2.0 x 2.6 cm. A fluid collection along the distal duodenum is increased in size, now measuring  3.5 x 2.2 x 5.0 cm. The collection along the pharynx limb of the loop ileostomy in the right lower quadrant measures 2.0 by 3.2 x 7.1 cm. There is some free fluid without other focal collections. Vascular/Lymphatic: Atherosclerotic calcifications are present in the aorta and branch vessels without aneurysm. No significant adenopathy is present. Reproductive: Uterus and bilateral adnexa are unremarkable. Other: Laparotomy wound is healing by secondary intention. The deep portion of the wound is closed. No significant ventral hernias are present. Fluid is described above. Musculoskeletal: Degenerative anterolisthesis is again noted at L4-5. Multilevel facet degenerative changes are noted. Vertebral body heights are normal. Hips are located and within normal limits. Pelvis is unremarkable. IMPRESSION: 1. 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 Diverticular changes throughout decompressed bowel without definite focal inflammation. There is some wall thickening in the sigmoid colon. 2. Existing drainage catheters are not associated with any significant residual fluid collections. 3. Three separate fluid collections are increasing, as described. 4.  Aortic Atherosclerosis (ICD10-I70.0). 5. Bibasilar airspace disease is likely atelectasis. Electronically Signed   By: San Morelle M.D.   On: 05/07/2018 13:54    Scheduled Meds:  . acetaminophen  500 mg Oral Q8H  . Chlorhexidine Gluconate Cloth  6 each Topical Daily  . diltiazem  60 mg Oral Q8H  . famotidine  20 mg Oral BID  . guaiFENesin  600 mg Oral BID  . heparin injection (subcutaneous)  5,000 Units Subcutaneous Q8H  . insulin aspart  0-15 Units Subcutaneous TID WC  .  insulin aspart  0-5 Units Subcutaneous QHS  . latanoprost  1 drop Both Eyes QHS  . mouth rinse  15 mL Mouth Rinse BID  . methocarbamol  1,000 mg Oral QID  . nystatin   Topical TID  . pregabalin  75 mg Oral QHS  . timolol  1 drop Both Eyes BID   Continuous Infusions: . sodium chloride Stopped (05/03/18 0616)  . fluconazole (DIFLUCAN) IV 400 mg (05/08/18 0910)  . magnesium sulfate 1 - 4 g bolus IVPB 50 mL/hr at 05/07/18 1057  . piperacillin-tazobactam (ZOSYN)  IV     Anti-infectives (From admission, onward)   Start     Dose/Rate Route Frequency Ordered Stop   05/08/18 0815  piperacillin-tazobactam (ZOSYN) IVPB 3.375 g     3.375 g 12.5 mL/hr over 240 Minutes Intravenous Every 8 hours 05/08/18 0801     05/05/18 1245  meropenem (MERREM) 1 g in sodium chloride 0.9 % 100 mL IVPB  Status:  Discontinued     1 g 200 mL/hr over 30 Minutes Intravenous Every 8 hours 05/05/18 1245 05/06/18 1006   05/01/18 1600  meropenem (MERREM) 1 g in sodium chloride 0.9 % 100 mL IVPB  Status:  Discontinued     1 g 200 mL/hr over 30 Minutes Intravenous Every 8 hours 05/01/18 1539 05/05/18 1243   04/30/18 1215  fluconazole (DIFLUCAN) IVPB 400 mg  Status:  Discontinued     400 mg 100 mL/hr over 120 Minutes Intravenous To Surgery 04/30/18 1200 05/01/18 1215   04/29/18 1100  fluconazole (DIFLUCAN) IVPB 400 mg     400 mg 100 mL/hr over 120 Minutes Intravenous Every 24 hours 04/29/18 1006 05/27/18 0959   04/27/18 1500  meropenem (MERREM) 1 g in sodium chloride 0.9 % 100 mL IVPB  Status:  Discontinued     1 g 200 mL/hr over 30 Minutes Intravenous Every 12 hours 04/27/18 1008 05/01/18 1539   04/26/18 1300  metroNIDAZOLE (FLAGYL) IVPB  500 mg  Status:  Discontinued     500 mg 100 mL/hr over 60 Minutes Intravenous Every 6 hours 04/26/18 1257 04/27/18 1041   04/15/18 1700  meropenem (MERREM) 1 g in sodium chloride 0.9 % 100 mL IVPB  Status:  Discontinued     1 g 200 mL/hr over 30 Minutes Intravenous Every 8 hours  04/15/18 1534 04/27/18 1008   04/09/18 2200  meropenem (MERREM) 1 g in sodium chloride 0.9 % 100 mL IVPB  Status:  Discontinued     1 g 200 mL/hr over 30 Minutes Intravenous Every 8 hours 04/09/18 1244 04/14/18 0944   04/06/18 1300  meropenem (MERREM) 1 g in sodium chloride 0.9 % 100 mL IVPB  Status:  Discontinued     1 g 200 mL/hr over 30 Minutes Intravenous Every 12 hours 04/06/18 1145 04/09/18 1244   04/06/18 1145  meropenem (MERREM) 1 g in sodium chloride 0.9 % 100 mL IVPB  Status:  Discontinued     1 g 200 mL/hr over 30 Minutes Intravenous Every 8 hours 04/06/18 1139 04/06/18 1145   04/06/18 0900  ciprofloxacin (CIPRO) IVPB 400 mg  Status:  Discontinued     400 mg 200 mL/hr over 60 Minutes Intravenous Every 12 hours 04/06/18 0732 04/06/18 1139   04/06/18 0300  metroNIDAZOLE (FLAGYL) IVPB 500 mg  Status:  Discontinued     500 mg 100 mL/hr over 60 Minutes Intravenous Every 8 hours 04/05/18 2335 04/06/18 1139   04/05/18 1930  ciprofloxacin (CIPRO) IVPB 400 mg     400 mg 200 mL/hr over 60 Minutes Intravenous  Once 04/05/18 1925 04/05/18 2217   04/05/18 1930  metroNIDAZOLE (FLAGYL) IVPB 500 mg     500 mg 100 mL/hr over 60 Minutes Intravenous  Once 04/05/18 1925 04/05/18 2102      LOS: 31 days   Signature  Lala Lund M.D on 05/08/2018 at 10:52 AM   -  To page go to [email or web address] - password [facility]

## 2018-05-08 NOTE — Progress Notes (Signed)
Patient off floor to vascular.  

## 2018-05-09 LAB — CBC
HCT: 24.9 % — ABNORMAL LOW (ref 36.0–46.0)
Hemoglobin: 7.9 g/dL — ABNORMAL LOW (ref 12.0–15.0)
MCH: 28.9 pg (ref 26.0–34.0)
MCHC: 31.7 g/dL (ref 30.0–36.0)
MCV: 91.2 fL (ref 80.0–100.0)
Platelets: 341 10*3/uL (ref 150–400)
RBC: 2.73 MIL/uL — ABNORMAL LOW (ref 3.87–5.11)
RDW: 17.5 % — ABNORMAL HIGH (ref 11.5–15.5)
WBC: 16.2 10*3/uL — ABNORMAL HIGH (ref 4.0–10.5)
nRBC: 0.8 % — ABNORMAL HIGH (ref 0.0–0.2)

## 2018-05-09 LAB — BASIC METABOLIC PANEL
Anion gap: 7 (ref 5–15)
BUN: 32 mg/dL — ABNORMAL HIGH (ref 8–23)
CO2: 28 mmol/L (ref 22–32)
CREATININE: 1 mg/dL (ref 0.44–1.00)
Calcium: 7.8 mg/dL — ABNORMAL LOW (ref 8.9–10.3)
Chloride: 101 mmol/L (ref 98–111)
GFR calc Af Amer: >60 mL/min (ref >60)
GFR calc non Af Amer: 56 mL/min — ABNORMAL LOW (ref >60)
Glucose, Bld: 90 mg/dL (ref 70–99)
Potassium: 3.3 mmol/L — ABNORMAL LOW (ref 3.5–5.1)
Sodium: 136 mmol/L (ref 135–145)

## 2018-05-09 LAB — HEPARIN LEVEL (UNFRACTIONATED): Heparin Unfractionated: 0.59 IU/mL (ref 0.30–0.70)

## 2018-05-09 LAB — GLUCOSE, CAPILLARY
GLUCOSE-CAPILLARY: 97 mg/dL (ref 70–99)
Glucose-Capillary: 85 mg/dL (ref 70–99)
Glucose-Capillary: 102 mg/dL — ABNORMAL HIGH (ref 70–99)
Glucose-Capillary: 81 mg/dL (ref 70–99)

## 2018-05-09 LAB — MAGNESIUM: Magnesium: 1.8 mg/dL (ref 1.7–2.4)

## 2018-05-09 LAB — PHOSPHORUS: Phosphorus: 3.6 mg/dL (ref 2.5–4.6)

## 2018-05-09 MED ORDER — HEPARIN (PORCINE) 25000 UT/250ML-% IV SOLN
1400.0000 [IU]/h | INTRAVENOUS | Status: DC
  Administered 2018-05-09: 1500 [IU]/h via INTRAVENOUS
  Administered 2018-05-10: 1400 [IU]/h via INTRAVENOUS
  Administered 2018-05-11 – 2018-05-12 (×2): 1150 [IU]/h via INTRAVENOUS
  Filled 2018-05-09 (×5): qty 250

## 2018-05-09 MED ORDER — POTASSIUM CHLORIDE CRYS ER 20 MEQ PO TBCR
40.0000 meq | EXTENDED_RELEASE_TABLET | Freq: Once | ORAL | Status: AC
  Administered 2018-05-09: 40 meq via ORAL
  Filled 2018-05-09: qty 2

## 2018-05-09 NOTE — Progress Notes (Signed)
Calorie Count Note  48-hour calorie count ordered and ended yesterday at lunch meal. RD to d/c nursing order for Calorie Count.  Of note: pt has met on average 29% of minimum kcal needs and 54% of minimum protein needs throughout calorie count.  Diet: Soft Supplements: - Recommend Premier Protein BID (pt bringing from home), each provides 160 kcal and 30 grams of protein  11/27: Dinner: 89 kcal, 4 grams of protein Supplements: none recorded  11/28: Breakfast: 90 kcal, 5 grams of protein Lunch: 330 kcal, 34 grams of protein Supplements: none recorded  Total 24-hour intake: 509 kcal (28% of minimum estimated needs)  43 grams of protein (48% of minimum estimated needs)  Nutrition Diagnosis: Inadequate oral intakerelated to inability to eatas evidenced by energy intake < or equal to 50% for > or equal to 5 days (NPO/clear liquid status >5 days).  Goal: Patient will meet greater than or equal to 90% of their needs  Intervention: - Premier Protein BID   Gaynell Face, MS, RD, LDN Inpatient Clinical Dietitian Pager: [phone] Weekend/After Hours: [phone]

## 2018-05-09 NOTE — Consult Note (Signed)
[place] Nurse wound consult note Patient receiving care in [facility] [phone].  Spouse in room, but stepped out when [facility] dressing change initiated.  Ostomy pouch placed two days ago intact, no need to change today.  Spouse tells me she will be discharged to a rehab center when she leaves the hospital. Reason for Consult: VAC to abdominal wound dressing change. Wound type: Surgical Wound bed: Pale pink granulation tissue with small, scattered yellowed areas. Periwound: Intact, normal color and texture. Dressing procedure/placement/frequency: MWF VAC dressing by [place] team.  There is a deep, non-undermined wound, at the inferior aspect of the wound.  I placed a separate, narrow piece of black foam into this area.  Then the main wound bed was covered in a thick piece of black foam; there is a total of 2 pieces of foam in the wound.  The Trac pad was placed and an immediate seal was obtained.  The patient tolerated the change process extremely well, with very little discomfort, that was aided by instructions I provided her on on breathing through the process rather than holding her breath. Val Riles, RN, MSN, CWOCN, CNS-BC, pager [phone]

## 2018-05-09 NOTE — Progress Notes (Signed)
Supervising Physician: Daryll Brod  Patient Status: [name] - In-pt  Subjective: Sleeping, feels 'drugged up' Surgery notes reviewed. Husband at bedside  Objective: Physical Exam: BP 117/62 (BP Location: Left Leg)   Pulse 94   Temp 98.6 F (37 C) (Oral)   Resp 14   Ht 5' 5.5" (1.664 m)   Wt 113.9 kg   SpO2 92%   BMI 41.15 kg/m  Abd: Midline wound vac intact (R)mid abd ileostomy bag full RLQ drain intact, site clean. Scant serosanguinous output.    Current Facility-Administered Medications:  .  0.9 %  sodium chloride infusion, , Intravenous, PRN, Elgergawy, Silver Huguenin, MD, Stopped at 05/03/18 [phone] .  acetaminophen (TYLENOL) tablet 500 mg, 500 mg, Oral, Q8H, Simaan, Elizabeth S, PA-C, 500 mg at 05/09/18 7412 .  albuterol (PROVENTIL) (2.5 MG/3ML) 0.083% nebulizer solution 2.5 mg, 2.5 mg, Nebulization, Q6H PRN, Meuth, Brooke A, PA-C .  Chlorhexidine Gluconate Cloth 2 % PADS 6 each, 6 each, Topical, Daily, Meuth, Brooke A, PA-C, 6 each at 05/09/18 0941 .  digoxin (LANOXIN) tablet 0.25 mg, 0.25 mg, Oral, Daily, Thurnell Lose, MD, 0.25 mg at 05/09/18 0943 .  diltiazem (CARDIZEM) injection 10 mg, 10 mg, Intravenous, Q6H PRN, Thurnell Lose, MD, 10 mg at 05/08/18 8786 .  diltiazem (CARDIZEM) tablet 60 mg, 60 mg, Oral, Q8H, Thurnell Lose, MD, 60 mg at 05/09/18 0614 .  diphenhydrAMINE (BENADRYL) injection 25 mg, 25 mg, Intravenous, Q6H PRN, Thurnell Lose, MD .  famotidine (PEPCID) tablet 20 mg, 20 mg, Oral, BID, Romona Curls, RPH, 20 mg at 05/09/18 7672 .  ferrous sulfate tablet 325 mg, 325 mg, Oral, BID WC, Thurnell Lose, MD, 325 mg at 05/09/18 0942 .  fluconazole (DIFLUCAN) IVPB 400 mg, 400 mg, Intravenous, Q24H, Karren Cobble, RPH, Last Rate: 100 mL/hr at 05/09/18 1058, 400 mg at 05/09/18 1058 .  folic acid (FOLVITE) tablet 1 mg, 1 mg, Oral, Daily, Thurnell Lose, MD, 1 mg at 05/09/18 0943 .  guaiFENesin (MUCINEX) 12 hr tablet 600 mg, 600 mg, Oral,  BID, Simaan, Darci Current, PA-C, 600 mg at 05/09/18 0942 .  heparin injection 5,000 Units, 5,000 Units, Subcutaneous, Q8H, Thurnell Lose, MD, 5,000 Units at 05/09/18 [phone] .  hydrALAZINE (APRESOLINE) injection 10 mg, 10 mg, Intravenous, Q6H PRN, Meuth, Brooke A, PA-C .  HYDROcodone-acetaminophen (NORCO/VICODIN) 5-325 MG per tablet 2 tablet, 2 tablet, Oral, Q4H PRN, Jill Alexanders, PA-C, 2 tablet at 05/07/18 1000 .  insulin aspart (novoLOG) injection 0-15 Units, 0-15 Units, Subcutaneous, TID WC, Romona Curls, [place], 2 Units at 05/08/18 1304 .  insulin aspart (novoLOG) injection 0-5 Units, 0-5 Units, Subcutaneous, QHS, Baird, Haley P, RPH .  latanoprost (XALATAN) 0.005 % ophthalmic solution 1 drop, 1 drop, Both Eyes, QHS, Meuth, Brooke A, PA-C, 1 drop at 05/08/18 2138 .  magnesium sulfate IVPB 2 g 50 mL, 2 g, Intravenous, Once, Thurnell Lose, MD, Last Rate: 50 mL/hr at 05/07/18 1057 .  MEDLINE mouth rinse, 15 mL, Mouth Rinse, BID, Elgergawy, Silver Huguenin, MD, 15 mL at 05/09/18 0945 .  methocarbamol (ROBAXIN) tablet 1,000 mg, 1,000 mg, Oral, QID, Simaan, Elizabeth S, PA-C, 1,000 mg at 05/09/18 0945 .  morphine 2 MG/ML injection 2 mg, 2 mg, Intravenous, Q3H PRN, Thurnell Lose, MD .  nystatin (MYCOSTATIN/NYSTOP) topical powder, , Topical, TID, Meuth, Brooke A, PA-C .  ondansetron (ZOFRAN) injection 4 mg, 4 mg, Intravenous, Q6H PRN, Meuth, Brooke A, PA-C,  4 mg at 04/29/18 1136 .  phenol (CHLORASEPTIC) mouth spray 1 spray, 1 spray, Mouth/Throat, PRN, Meuth, Brooke A, PA-C .  piperacillin-tazobactam (ZOSYN) IVPB 3.375 g, 3.375 g, Intravenous, Q8H, Thurnell Lose, MD, Last Rate: 12.5 mL/hr at 05/09/18 0613, 3.375 g at 05/09/18 0613 .  potassium chloride SA (K-DUR,KLOR-CON) CR tablet 40 mEq, 40 mEq, Oral, Once, Lala Lund K, MD .  pregabalin (LYRICA) capsule 75 mg, 75 mg, Oral, QHS, Meuth, Brooke A, PA-C, 75 mg at 05/08/18 2137 .  sodium chloride (OCEAN) 0.65 % nasal spray 1 spray, 1 spray,  Each Nare, PRN, Lala Lund K, MD .  sodium chloride flush (NS) 0.9 % injection 10-40 mL, 10-40 mL, Intracatheter, PRN, Meuth, Brooke A, PA-C, 20 mL at 05/07/18 2228 .  timolol (TIMOPTIC) 0.5 % ophthalmic solution 1 drop, 1 drop, Both Eyes, BID, Meuth, Brooke A, PA-C, 1 drop at 05/09/18 0946  Labs: CBC Recent Labs    05/08/18 0349 05/09/18 0429  WBC 19.8* 16.2*  HGB 8.1* 7.9*  HCT 25.6* 24.9*  PLT 333 341   BMET Recent Labs    05/08/18 0349 05/09/18 0429  NA 138 136  K 3.6 3.3*  CL 99 101  CO2 28 28  GLUCOSE 121* 90  BUN 49* 32*  CREATININE 1.18* 1.00  CALCIUM 7.9* 7.8*   LFT Recent Labs    05/07/18 0348  PROT 5.0*  ALBUMIN 1.8*  AST 44*  ALT 42  ALKPHOS 101  BILITOT 0.6   PT/INR No results for input(s): LABPROT, INR in the last 72 hours.   Studies/Results: Ct Abdomen Pelvis W Contrast  Result Date: 05/07/2018 CLINICAL DATA:  Abdominal pain, fever, abscess suspected. Partial bowel obstruction 8 days ago. EXAM: CT ABDOMEN AND PELVIS WITH CONTRAST TECHNIQUE: Multidetector CT imaging of the abdomen and pelvis was performed using the standard protocol following bolus administration of intravenous contrast. CONTRAST:  136mL OMNIPAQUE IOHEXOL 300 MG/ML  SOLN COMPARISON:  CTA abdomen pelvis 04/27/2018 FINDINGS: Lower chest: Dependent airspace disease is worse right than left. Calcified granulomas are present at the left base. No other significant airspace disease is present. Heart size is normal. No significant pleural or pericardial effusion is present. Hepatobiliary: There is diffuse fatty infiltration of liver. Cholecystectomy is noted. No focal hepatic lesions are present. The common bile duct is within normal limits. Pancreas: The pancreas is atrophic. No discrete lesions are present Spleen: Normal in size without focal abnormality. Adrenals/Urinary Tract: Adrenal glands are normal bilaterally. A 3 mm nonobstructing stone is stable at the lower pole of the right  kidney. No other significant stones are present. There is no mass lesion. Minimal fluid is present about both kidneys. Ureters are within normal limits. The urinary bladder is within normal limits. Stomach/Bowel: The stomach and duodenum are within normal limits. The proximal small bowel demonstrates some inflammatory change. There is fluid within the small bowel mesentery in some wall thickening. Right lower quadrant ileostomy demonstrates contrast material. No obstruction is present. The terminal ileum is decompressed. Cecum and ascending colon are mostly decompressed. There are some diverticular changes within the transverse colon. Additional diverticular changes are present in the descending and sigmoid colon. Sigmoid colon is mostly collapsed. A pigtail drainage catheter in the right lower quadrant is stable. There is no significant fluid collection associated. A straight drain extends into the anatomic pelvis via a left lower quadrant approach. Enlarging fluid collection along the lateral peritoneal wall and the left lower quadrant now measures 5.2 x 2.0 x 2.6  cm. A fluid collection along the distal duodenum is increased in size, now measuring 3.5 x 2.2 x 5.0 cm. The collection along the pharynx limb of the loop ileostomy in the right lower quadrant measures 2.0 by 3.2 x 7.1 cm. There is some free fluid without other focal collections. Vascular/Lymphatic: Atherosclerotic calcifications are present in the aorta and branch vessels without aneurysm. No significant adenopathy is present. Reproductive: Uterus and bilateral adnexa are unremarkable. Other: Laparotomy wound is healing by secondary intention. The deep portion of the wound is closed. No significant ventral hernias are present. Fluid is described above. Musculoskeletal: Degenerative anterolisthesis is again noted at L4-5. Multilevel facet degenerative changes are noted. Vertebral body heights are normal. Hips are located and within normal limits. Pelvis  is unremarkable. IMPRESSION: 1. 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 Diverticular changes throughout decompressed bowel without definite focal inflammation. There is some wall thickening in the sigmoid colon. 2. Existing drainage catheters are not associated with any significant residual fluid collections. 3. Three separate fluid collections are increasing, as described. 4.  Aortic Atherosclerosis (ICD10-I70.0). 5. Bibasilar airspace disease is likely atelectasis. Electronically Signed   By: San Morelle M.D.   On: 05/07/2018 13:54   Vas [place] Upper Extremity Venous Duplex  Result Date: 05/08/2018 UPPER VENOUS STUDY  Indications: Follow up of DVT found in one of the brachial veins 04/15/18 Performing Technologist: Sharion Dove RVS  Examination Guidelines: A complete evaluation includes B-mode imaging, spectral Doppler, color Doppler, and power Doppler as needed of all accessible portions of each vessel. Bilateral testing is considered an integral part of a complete examination. Limited examinations for reoccurring indications may be performed as noted.  Right Findings: +----------+------------+----------+---------+-----------+-----------------+ RIGHT     CompressiblePropertiesPhasicitySpontaneous     Summary      +----------+------------+----------+---------+-----------+-----------------+ IJV           Full                 Yes       Yes                      +----------+------------+----------+---------+-----------+-----------------+ Subclavian                         Yes       Yes                      +----------+------------+----------+---------+-----------+-----------------+ Axillary      None                                  Age Indeterminate +----------+------------+----------+---------+-----------+-----------------+ Brachial    Partial                                 Age Indeterminate  +----------+------------+----------+---------+-----------+-----------------+ Cephalic      Full                                                    +----------+------------+----------+---------+-----------+-----------------+ Basilic       Full                                                    +----------+------------+----------+---------+-----------+-----------------+  Left Findings: +----------+------------+----------+---------+-----------+-------+ LEFT      CompressiblePropertiesPhasicitySpontaneousSummary +----------+------------+----------+---------+-----------+-------+ Subclavian                         Yes       Yes            +----------+------------+----------+---------+-----------+-------+  Summary:  Right: Findings consistent with age indeterminate deep vein thrombosis involving the right brachial veins and right axillary vein. DVT found in one of the brachial veins, 04/15/18, remains. DVT appears to have propagated in to the axillary vein.  Left: No evidence of thrombosis in the subclavian.  *See table(s) above for measurements and observations.  Diagnosing physician: Servando Snare MD Electronically signed by Servando Snare MD on 05/08/2018 at 6:00:08 PM.    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

## 2018-05-09 NOTE — Progress Notes (Signed)
ANTICOAGULATION CONSULT NOTE - Initial Consult  Pharmacy Consult for heparin Indication: acute DVT  Allergies  Allergen Reactions  . Lactose Intolerance (Gi) Other (See Comments)    G.I. Upset  . Butrans [Buprenorphine] Rash and Other (See Comments)    Infected skin underneath application  . Penicillins Rash    Facial rash Has patient had a PCN reaction causing immediate rash, facial/tongue/throat swelling, SOB or lightheadedness with hypotension: Yes Has patient had a PCN reaction causing severe rash involving mucus membranes or skin necrosis: No Has patient had a PCN reaction that required hospitalization No Has patient had a PCN reaction occurring within the last 10 years: Yes If all of the above answers are "NO", then may proceed with Cephalosporin use.   . Simvastatin Rash    Patient Measurements: Height: 5' 5.5" (166.4 cm) Weight: 251 lb 1.7 oz (113.9 kg) IBW/kg (Calculated) : 58.15 Heparin Dosing Weight: 85 kg  Vital Signs: Temp: 98.6 F (37 C) (11/29 0530) Temp Source: Oral (11/29 0530) BP: 117/62 (11/29 0530) Pulse Rate: 94 (11/29 0530)  Labs: Recent Labs    05/07/18 0348 05/07/18 2219 05/08/18 0349 05/09/18 0429  HGB 7.7* 8.3* 8.1* 7.9*  HCT 24.7* 25.1* 25.6* 24.9*  PLT 334  --  333 341  CREATININE 1.12*  --  1.18* 1.00    Estimated Creatinine Clearance: 63.7 mL/min (by C-G formula based on SCr of 1 mg/dL).   Medical History: Past Medical History:  Diagnosis Date  . Anginal pain [facility])    sees Dr Einar Gip.   . Arthritis    rheumatoid ..   . Diabetes mellitus without complication (HCC)    Metformin 2.3.2017  . Diverticulitis   . Diverticulitis of large intestine with perforation and abscess 04/05/2018  . Dysrhythmia   . Fibromyalgia   . GERD (gastroesophageal reflux disease)   . High cholesterol   . Hypertension   . Liver disease 08- 2012   per Dr Dagmar Hait pt has enlarged liver  . Peripheral vascular disease (HCC)    legs  . Pneumonia 06/17/2016   . Sleep apnea    sleep study  oct 2012  . Stroke [facility]) 2011   [place]      Medications:  Medications Prior to Admission  Medication Sig Dispense Refill Last Dose  . Blood Glucose Monitoring Suppl (FREESTYLE LITE) DEVI See admin instructions.  0 04/05/2018 at Unknown time  . ciprofloxacin (CIPRO) 500 MG/5ML (10%) suspension Take by mouth 2 (two) times daily.   04/05/2018 at Unknown time  . clopidogrel (PLAVIX) 75 MG tablet Take 1 tablet (75 mg total) by mouth daily with breakfast. 90 tablet 6 04/05/2018 at 0800  . Dexlansoprazole 30 MG capsule Take 40 mg by mouth daily.   04/05/2018 at Unknown time  . diclofenac sodium (VOLTAREN) 1 % GEL Apply 2 g topically 4 (four) times daily as needed (Pain).    04/05/2018 at Unknown time  . glipiZIDE (GLUCOTROL) 10 MG tablet Take 10 mg by mouth daily before breakfast.   04/05/2018 at Unknown time  . HYDROcodone-acetaminophen (NORCO) 10-325 MG tablet Take 1 tablet by mouth 4 (four) times daily.   04/05/2018 at Unknown time  . IRON PO Take 1 tablet by mouth daily.   04/05/2018 at Unknown time  . isosorbide dinitrate (ISORDIL) 5 MG tablet Take 1 tablet (5 mg total) by mouth 3 (three) times daily before meals. 90 tablet 1 04/05/2018 at Unknown time  . leflunomide (ARAVA) 20 MG tablet Take 20 mg by mouth  daily.   04/05/2018 at Unknown time  . metoprolol succinate (TOPROL-XL) 25 MG 24 hr tablet Take 25 mg by mouth daily.   04/05/2018 at 0800  . metroNIDAZOLE (FLAGYL) 250 MG tablet Take 250 mg by mouth 3 (three) times daily.   04/05/2018 at Unknown time  . Olmesartan-amLODIPine-HCTZ (TRIBENZOR) 40-5-25 MG TABS Take 1 tablet by mouth daily.    04/05/2018 at Unknown time  . pravastatin (PRAVACHOL) 40 MG tablet Take 40 mg by mouth at bedtime.   04/05/2018 at Unknown time  . predniSONE (DELTASONE) 5 MG tablet Take 5 mg by mouth daily with breakfast.   04/05/2018 at Unknown time  . pregabalin (LYRICA) 75 MG capsule Take 75 mg by mouth 3 (three) times daily.    04/05/2018 at Unknown time  . Tafluprost (ZIOPTAN) 0.0015 % SOLN Place 1 drop into both eyes at bedtime.    04/05/2018 at Unknown time  . temazepam (RESTORIL) 30 MG capsule Take 30 mg by mouth at bedtime.   04/05/2018 at Unknown time  . timolol (BETIMOL) 0.5 % ophthalmic solution Place 1 drop into both eyes 2 (two) times daily.    04/05/2018 at Unknown time  . Tofacitinib Citrate (XELJANZ) 5 MG TABS Take 1 tablet by mouth daily.   04/05/2018 at Unknown time  . VITAMIN D, ERGOCALCIFEROL, PO Take 50,000 Units by mouth once a week. Thursdays   04/05/2018 at Unknown time  . docusate sodium (COLACE) 100 MG capsule Take 2 capsules (200 mg total) by mouth at bedtime. (Patient taking differently: Take 200 mg by mouth as needed. 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 RUE duplex was rechecked which showed DVT remains and propagated. Pharmacy consulted to resume IV heparin. Hgb remains low at 7.9 and MD notes ileostomy output appears dark - will keep heparin level on low end of normal and monitor closely; 1 unit PRBC given 11/27. Previous heparin levels were mostly in the 0.3-0.5s range on 1500 units/hr. Last heparin 5000 units SQ dose at 06:13.  Goal of Therapy:  Heparin level 0.3-0.5 units/ml  Monitor platelets by anticoagulation protocol: Yes   Plan:  Discontinue SQ heparin Resume heparin drip at 1500 units/hr 8 hr heparin level Daily heparin level and CBC Monitor for s/sx of bleeding   Renold Genta, PharmD, BCPS Clinical Pharmacist Clinical phone for 05/09/2018 until 3p is [phone] 05/09/2018 11:21 AM  **Pharmacist phone directory can now be found on [email or web address] listed under [place]**

## 2018-05-09 NOTE — Progress Notes (Signed)
PROGRESS NOTE    [name]  LDJ:[id number] DOB: 07-04-1945 DOA: 04/05/2018 PCP: [name], MD    Brief Narrative:   72 year old female with past medical history of hypertension, type 2 diabetes mellitus chronic kidney disease rheumatoid arthritis coronary artery disease S/P CABG on Plavix who was admitted with abdominal pain and found to have sigmoid diverticulitis on April 05, 2018 with perforation and abscess.  Initially was felt to be improving on IV Merrem but she continued to have abdominal pain. CT scan of the abdomen and pelvis was obtained April 15, 2018 and was significant for worsening diverticulitis with abdominal fluid collections, she was transferred to [facility] for IR evaluation, he did receive total of 3 percutaneous drains, with repeat CT abdomen pelvis 04/27/2018, still showing remaining fluid collection/abscess despite he strains, and appropriate antibiotic coverage, patient went for exploratory laparotomy with lysis of adhesion, small bowel resection with loop ileostomy, and placement of wound VAC by Dr. Georgette Dover 04/30/2018.  SUBJECTIVE:  Patient in bed, appears comfortable, denies any headache, no fever, no chest pain or pressure, no shortness of breath , no abdominal pain. No focal weakness.  Assessment & Plan:    Perforated sigmoid diverticulitis/abdominal abscess : General surgery following, initially managed conservatively with multiple abdominal drain placements however repeat CT scan on 04/27/2018 showed worsening of abscess and disease process after which she was taken to the OR for laparotomy with lysis of adhesion, small bowel resection, loop ileostomy and placement of wound VAC by Dr. Georgette Dover on 04/30/2018.  Abscess cultures noted which are growing Candida, Clostridium on with Peptostreptococcus.  Has already finished 4 weeks of meropenem on 05/06/2018.  At this time ID and general surgery will following, repeat CT scan on 05/07/2018 shows multiple new  collections of abscess in the abdomen, defer management to ID and general surgery apparently IR unable to reach these new collections, again will defer to general surgery on management of these fluid collections/abscess, she has currently been started on Zosyn per ID on 05/08/2018, IV Diflucan being continued with start date of 04/29/2018.      Acute right upper extremity DVT extremity  -  due to PICC line this was just discontinued April 16, 2018 was on full dose Lovenox for anticoagulation as well however her H&H dropped and her anticoagulation had to be held on 05/06/2018, repeated right upper extremity ultrasound on 05/08/2018 which shows her DVTs extending hence will reinitiate heparin drip per pharmacy and monitor.  Anemia of critical illness.  Some element of dilution but her ileostomy output appears dark, despite holding PNA and diuresing her with Lasix her H&H is going down suggesting some ongoing blood loss, continue IV PPI, anemia panel was inconclusive she received 1 unit of packed RBC on 05/07/2018 with stabilization of H&H.  Will reinitiate heparin drip with caution and continue to monitor H&H.  Acute kidney injury with chronic kidney disease stage III  - recent creatinine of 1.2, ARF resolved currently close to baseline.  Fibromyalgia - continue Lyrica able to take oral  Hypertension improved.  - BP soft, low dose B Blocker.  Advanced weakness and deconditioning -   PT has been consulted they recommend SNF.  Patient encouraged to sit up in the chair rather than laying in bed.  Counseled patient and husband multiple times including 05/07/2018, 05/08/2018.  Acute illness required anemia on chronic anemia  - she required total of 4 units PRBC transfusion during hospital stay .monitor closely and transfuse as needed .  Depression patient recently started on Lexapro  Paroxysmal atrial tachycardia.  She has not had any more episodes of PAT since that of April 22, 2018 echocardiogram  that was done during showed preserved ejection fraction she has stable TSH, on low-dose Cardizem her blood pressure too low to adjust further, will give her loading dose of digoxin thereafter patient is on oral digoxin on 05/08/2018.  Monitor heart rate.  Hypomagnesemia - replaced monitor  Type 2 diabetes mellitus controlled  - Continue with insulin sliding scale every 4 hours as she is on TPN, CBG acceptable .  CBG (last 3)  Recent Labs    05/08/18 1625 05/08/18 2155 05/09/18 0758  GLUCAP 107* 94 85       DVT prophylaxis: Subcutaneous Lovenox treatment dose for right upper extremity DVT  code Status: Full Family Communication: family at bedside  05/04/18 Disposition Plan: Physical therapy recommended SNF Consultants:   General surgery  IR  ID - phone Dr Johnnye Sima 05/05/18  Procedures:  - Echocardiogram -   Left ventricle: The cavity size was normal. Wall thickness was increased in a pattern of moderate LVH. Systolic function was  vigorous. The estimated ejection fraction was in the range of 65% to 70%. Indeterminant diastolic function. Wall motion was normal; there were no regional wall motion abnormalities. - Aortic valve: Valve area (VTI): 2.74 cm^2. Valve area (Vmax):   2.58 cm^2. Valve area (Vmean): 2.56 cm^2.  -S/PIR perc drains x 2 - 04/16/18, culture with multiple organisms present/none predominant -S/P3rdIR drain 11/13, culturegrowingFEW CANDIDA TROPICALIS, - Right arm PICC line - had DVT and was remioved -  Left arm PICC line - Exploratory laparotomy with lysis of adhesion, small bowel resection with loop ileostomy, and placement of wound VAC by Dr. Georgette Dover 04/30/2018. - CT 11/27 - 1. 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 Diverticular changes throughout decompressed bowel without definite focal inflammation. There is some wall thickening in the sigmoid colon. 2. Existing drainage catheters are not associated with any significant residual  fluid collections. 3. Three separate fluid collections are increasing, as described. 4.  Aortic Atherosclerosis (ICD10-I70.0). 5. Bibasilar airspace disease is likely atelectasis.    Objective: Vitals:   05/08/18 1325 05/08/18 2159 05/09/18 0500 05/09/18 0530  BP: 109/63 124/64  117/62  Pulse: 79 89  94  Resp: 18 14  14   Temp: 98.3 F (36.8 C) 99.3 F (37.4 C)  98.6 F (37 C)  TempSrc: Oral Oral  Oral  SpO2: 94% 91%  92%  Weight:   113.9 kg   Height:        Intake/Output Summary (Last 24 hours) at 05/09/2018 1105 Last data filed at 05/09/2018 0612 Gross per 24 hour  Intake 2551.91 ml  Output 750 ml  Net 1801.91 ml   Filed Weights   04/23/18 0500 04/24/18 0500 05/09/18 0500  Weight: 118.2 kg 118.3 kg 113.9 kg    Examination:  Awake Alert, Oriented X 3, No new F.N deficits, Normal affect [place].AT,PERRAL Supple Neck,No JVD, No cervical lymphadenopathy appriciated.  Symmetrical Chest wall movement, Good air movement bilaterally, CTAB RRR,No Gallops, Rubs or new Murmurs, No Parasternal Heave +ve B.Sounds, midline surgical wound VAC, right ileostomyileostomy bag, left JP drain with sanguinous material . No Cyanosis, Clubbing or edema, No new Rash or bruise   Data Reviewed: I have personally reviewed following labs and imaging studies  CBC: Recent Labs  Lab 05/05/18 0351 05/06/18 0406 05/07/18 0348 05/07/18 2219 05/08/18 0349 05/09/18 0429  WBC 13.1* 15.0*  19.1*  --  19.8* 16.2*  NEUTROABS 10.7*  --   --   --   --   --   HGB 8.0* 7.4* 7.7* 8.3* 8.1* 7.9*  HCT 24.8* 23.3* 24.7* 25.1* 25.6* 24.9*  MCV 91.5 92.8 92.5  --  90.1 91.2  PLT 262 254 334  --  333 272   Basic Metabolic Panel: Recent Labs  Lab 05/05/18 0351 05/06/18 0406 05/07/18 0348 05/08/18 0349 05/09/18 0429  NA 142 143 141 138 136  K 3.6 4.0 3.9 3.6 3.3*  CL 102 102 101 99 101  CO2 33* 31 30 28 28   GLUCOSE 200* 113* 121* 121* 90  BUN 40* 50* 48* 49* 32*  CREATININE 0.88 1.14* 1.12* 1.18*  1.00  CALCIUM 7.8* 8.1* 7.9* 7.9* 7.8*  MG 1.4* 1.8 1.5* 1.9 1.8  PHOS 3.6 5.7* 4.7*  --  3.6   GFR: Estimated Creatinine Clearance: 63.7 mL/min (by C-G formula based on SCr of 1 mg/dL). Liver Function Tests: Recent Labs  Lab 05/05/18 0351 05/07/18 0348  AST 64* 44*  ALT 47* 42  ALKPHOS 93 101  BILITOT 0.4 0.6  PROT 4.9* 5.0*  ALBUMIN 1.7* 1.8*   No results for input(s): LIPASE, AMYLASE in the last 168 hours. No results for input(s): AMMONIA in the last 168 hours. Coagulation Profile: No results for input(s): INR, PROTIME in the last 168 hours. Cardiac Enzymes: No results for input(s): CKTOTAL, CKMB, CKMBINDEX, TROPONINI in the last 168 hours. BNP (last 3 results) No results for input(s): PROBNP in the last 8760 hours. HbA1C: No results for input(s): HGBA1C in the last 72 hours. CBG: Recent Labs  Lab 05/08/18 0747 05/08/18 1302 05/08/18 1625 05/08/18 2155 05/09/18 0758  GLUCAP 114* 125* 107* 94 85   Lipid Profile: No results for input(s): CHOL, HDL, LDLCALC, TRIG, CHOLHDL, LDLDIRECT in the last 72 hours. Thyroid Function Tests: Recent Labs    05/07/18 0348  TSH 7.944*   Anemia Panel: No results for input(s): VITAMINB12, FOLATE, FERRITIN, TIBC, IRON, RETICCTPCT in the last 72 hours. Urine analysis:    Component Value Date/Time   COLORURINE YELLOW 04/05/2018 1924   APPEARANCEUR CLEAR 04/05/2018 1924   LABSPEC 1.021 04/05/2018 1924   PHURINE 5.0 04/05/2018 1924   GLUCOSEU NEGATIVE 04/05/2018 1924   HGBUR NEGATIVE 04/05/2018 1924   BILIRUBINUR NEGATIVE 04/05/2018 1924   KETONESUR NEGATIVE 04/05/2018 1924   PROTEINUR NEGATIVE 04/05/2018 1924   UROBILINOGEN 0.2 06/20/2013 1832   NITRITE NEGATIVE 04/05/2018 1924   LEUKOCYTESUR NEGATIVE 04/05/2018 1924   Sepsis Labs: @LABRCNTIP (procalcitonin:4,lacticidven:4)  ) Recent Results (from the past 240 hour(s))  Surgical pcr screen     Status: None   Collection Time: 04/29/18  6:05 PM  Result Value Ref Range  Status   MRSA, PCR NEGATIVE NEGATIVE Final   Staphylococcus aureus NEGATIVE NEGATIVE Final    Comment: (NOTE) The Xpert SA Assay (FDA approved for NASAL specimens in patients 25 years of age and older), is one component of a comprehensive surveillance program. It is not intended to diagnose infection nor to guide or monitor treatment. Performed at [facility], [place] [address] [place], [place] [address]   Aerobic Culture (superficial specimen)     Status: None (Preliminary result)   Collection Time: 05/07/18 10:43 AM  Result Value Ref Range Status   Specimen Description WOUND  Final   Special Requests NONE  Final   Gram Stain   Final    FEW WBC PRESENT, PREDOMINANTLY PMN FEW GRAM  POSITIVE COCCI    Culture   Final    MODERATE UNIDENTIFIED ORGANISM IDENTIFICATION AND SUSCEPTIBILITIES TO FOLLOW Performed at [facility], [place] [address], [place], [place] [address]    Report Status PENDING  Incomplete     Radiology Studies: Ct Abdomen Pelvis W Contrast  Result Date: 05/07/2018 CLINICAL DATA:  Abdominal pain, fever, abscess suspected. Partial bowel obstruction 8 days ago. EXAM: CT ABDOMEN AND PELVIS WITH CONTRAST TECHNIQUE: Multidetector CT imaging of the abdomen and pelvis was performed using the standard protocol following bolus administration of intravenous contrast. CONTRAST:  166mL OMNIPAQUE IOHEXOL 300 MG/ML  SOLN COMPARISON:  CTA abdomen pelvis 04/27/2018 FINDINGS: Lower chest: Dependent airspace disease is worse right than left. Calcified granulomas are present at the left base. No other significant airspace disease is present. Heart size is normal. No significant pleural or pericardial effusion is present. Hepatobiliary: There is diffuse fatty infiltration of liver. Cholecystectomy is noted. No focal hepatic lesions are present. The common bile duct is within normal limits. Pancreas: The pancreas is atrophic. No discrete lesions are present Spleen: Normal in size  without focal abnormality. Adrenals/Urinary Tract: Adrenal glands are normal bilaterally. A 3 mm nonobstructing stone is stable at the lower pole of the right kidney. No other significant stones are present. There is no mass lesion. Minimal fluid is present about both kidneys. Ureters are within normal limits. The urinary bladder is within normal limits. Stomach/Bowel: The stomach and duodenum are within normal limits. The proximal small bowel demonstrates some inflammatory change. There is fluid within the small bowel mesentery in some wall thickening. Right lower quadrant ileostomy demonstrates contrast material. No obstruction is present. The terminal ileum is decompressed. Cecum and ascending colon are mostly decompressed. There are some diverticular changes within the transverse colon. Additional diverticular changes are present in the descending and sigmoid colon. Sigmoid colon is mostly collapsed. A pigtail drainage catheter in the right lower quadrant is stable. There is no significant fluid collection associated. A straight drain extends into the anatomic pelvis via a left lower quadrant approach. Enlarging fluid collection along the lateral peritoneal wall and the left lower quadrant now measures 5.2 x 2.0 x 2.6 cm. A fluid collection along the distal duodenum is increased in size, now measuring 3.5 x 2.2 x 5.0 cm. The collection along the pharynx limb of the loop ileostomy in the right lower quadrant measures 2.0 by 3.2 x 7.1 cm. There is some free fluid without other focal collections. Vascular/Lymphatic: Atherosclerotic calcifications are present in the aorta and branch vessels without aneurysm. No significant adenopathy is present. Reproductive: Uterus and bilateral adnexa are unremarkable. Other: Laparotomy wound is healing by secondary intention. The deep portion of the wound is closed. No significant ventral hernias are present. Fluid is described above. Musculoskeletal: Degenerative anterolisthesis  is again noted at L4-5. Multilevel facet degenerative changes are noted. Vertebral body heights are normal. Hips are located and within normal limits. Pelvis is unremarkable. IMPRESSION: 1. 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 Diverticular changes throughout decompressed bowel without definite focal inflammation. There is some wall thickening in the sigmoid colon. 2. Existing drainage catheters are not associated with any significant residual fluid collections. 3. Three separate fluid collections are increasing, as described. 4.  Aortic Atherosclerosis (ICD10-I70.0). 5. Bibasilar airspace disease is likely atelectasis. Electronically Signed   By: San Morelle M.D.   On: 05/07/2018 13:54   Vas [place] Upper Extremity Venous Duplex  Result Date: 05/08/2018 UPPER VENOUS STUDY  Indications: Follow up of DVT found in one of the brachial veins 04/15/18 Performing Technologist: Sharion Dove RVS  Examination Guidelines: A complete evaluation includes B-mode imaging, spectral Doppler, color Doppler, and power Doppler as needed of all accessible portions of each vessel. Bilateral testing is considered an integral part of a complete examination. Limited examinations for reoccurring indications may be performed as noted.  Right Findings: +----------+------------+----------+---------+-----------+-----------------+ RIGHT     CompressiblePropertiesPhasicitySpontaneous     Summary      +----------+------------+----------+---------+-----------+-----------------+ IJV           Full                 Yes       Yes                      +----------+------------+----------+---------+-----------+-----------------+ Subclavian                         Yes       Yes                      +----------+------------+----------+---------+-----------+-----------------+ Axillary      None                                  Age Indeterminate  +----------+------------+----------+---------+-----------+-----------------+ Brachial    Partial                                 Age Indeterminate +----------+------------+----------+---------+-----------+-----------------+ Cephalic      Full                                                    +----------+------------+----------+---------+-----------+-----------------+ Basilic       Full                                                    +----------+------------+----------+---------+-----------+-----------------+  Left Findings: +----------+------------+----------+---------+-----------+-------+ LEFT      CompressiblePropertiesPhasicitySpontaneousSummary +----------+------------+----------+---------+-----------+-------+ Subclavian                         Yes       Yes            +----------+------------+----------+---------+-----------+-------+  Summary:  Right: Findings consistent with age indeterminate deep vein thrombosis involving the right brachial veins and right axillary vein. DVT found in one of the brachial veins, 04/15/18, remains. DVT appears to have propagated in to the axillary vein.  Left: No evidence of thrombosis in the subclavian.  *See table(s) above for measurements and observations.  Diagnosing physician: Servando Snare MD Electronically signed by Servando Snare MD on 05/08/2018 at 6:00:08 PM.    Final     Scheduled Meds:  . acetaminophen  500 mg Oral Q8H  . Chlorhexidine Gluconate Cloth  6 each Topical Daily  . digoxin  0.25 mg Oral Daily  . diltiazem  60 mg Oral Q8H  . famotidine  20 mg Oral BID  . ferrous sulfate  325 mg Oral BID WC  . folic acid  1 mg Oral Daily  . guaiFENesin  600 mg Oral BID  . heparin injection (subcutaneous)  5,000 Units Subcutaneous Q8H  . insulin aspart  0-15 Units Subcutaneous TID WC  . insulin aspart  0-5 Units Subcutaneous QHS  . latanoprost  1 drop Both Eyes QHS  . mouth rinse  15 mL Mouth Rinse BID  . methocarbamol   1,000 mg Oral QID  . nystatin   Topical TID  . potassium chloride  40 mEq Oral Once  . pregabalin  75 mg Oral QHS  . timolol  1 drop Both Eyes BID   Continuous Infusions: . sodium chloride Stopped (05/03/18 0616)  . fluconazole (DIFLUCAN) IV 400 mg (05/09/18 1058)  . magnesium sulfate 1 - 4 g bolus IVPB 50 mL/hr at 05/07/18 1057  . piperacillin-tazobactam (ZOSYN)  IV 3.375 g (05/09/18 8242)   Anti-infectives (From admission, onward)   Start     Dose/Rate Route Frequency Ordered Stop   05/08/18 0815  piperacillin-tazobactam (ZOSYN) IVPB 3.375 g     3.375 g 12.5 mL/hr over 240 Minutes Intravenous Every 8 hours 05/08/18 0801     05/05/18 1245  meropenem (MERREM) 1 g in sodium chloride 0.9 % 100 mL IVPB  Status:  Discontinued     1 g 200 mL/hr over 30 Minutes Intravenous Every 8 hours 05/05/18 1245 05/06/18 1006   05/01/18 1600  meropenem (MERREM) 1 g in sodium chloride 0.9 % 100 mL IVPB  Status:  Discontinued     1 g 200 mL/hr over 30 Minutes Intravenous Every 8 hours 05/01/18 1539 05/05/18 1243   04/30/18 1215  fluconazole (DIFLUCAN) IVPB 400 mg  Status:  Discontinued     400 mg 100 mL/hr over 120 Minutes Intravenous To Surgery 04/30/18 1200 05/01/18 1215   04/29/18 1100  fluconazole (DIFLUCAN) IVPB 400 mg     400 mg 100 mL/hr over 120 Minutes Intravenous Every 24 hours 04/29/18 1006 05/27/18 0959   04/27/18 1500  meropenem (MERREM) 1 g in sodium chloride 0.9 % 100 mL IVPB  Status:  Discontinued     1 g 200 mL/hr over 30 Minutes Intravenous Every 12 hours 04/27/18 1008 05/01/18 1539   04/26/18 1300  metroNIDAZOLE (FLAGYL) IVPB 500 mg  Status:  Discontinued     500 mg 100 mL/hr over 60 Minutes Intravenous Every 6 hours 04/26/18 1257 04/27/18 1041   04/15/18 1700  meropenem (MERREM) 1 g in sodium chloride 0.9 % 100 mL IVPB  Status:  Discontinued     1 g 200 mL/hr over 30 Minutes Intravenous Every 8 hours 04/15/18 1534 04/27/18 1008   04/09/18 2200  meropenem (MERREM) 1 g in sodium  chloride 0.9 % 100 mL IVPB  Status:  Discontinued     1 g 200 mL/hr over 30 Minutes Intravenous Every 8 hours 04/09/18 1244 04/14/18 0944   04/06/18 1300  meropenem (MERREM) 1 g in sodium chloride 0.9 % 100 mL IVPB  Status:  Discontinued     1 g 200 mL/hr over 30 Minutes Intravenous Every 12 hours 04/06/18 1145 04/09/18 1244   04/06/18 1145  meropenem (MERREM) 1 g in sodium chloride 0.9 % 100 mL IVPB  Status:  Discontinued     1 g 200 mL/hr over 30 Minutes Intravenous Every 8 hours 04/06/18 1139 04/06/18 1145   04/06/18 0900  ciprofloxacin (CIPRO) IVPB 400 mg  Status:  Discontinued     400 mg 200 mL/hr over 60 Minutes Intravenous Every 12 hours 04/06/18 0732 04/06/18  1139   04/06/18 0300  metroNIDAZOLE (FLAGYL) IVPB 500 mg  Status:  Discontinued     500 mg 100 mL/hr over 60 Minutes Intravenous Every 8 hours 04/05/18 2335 04/06/18 1139   04/05/18 1930  ciprofloxacin (CIPRO) IVPB 400 mg     400 mg 200 mL/hr over 60 Minutes Intravenous  Once 04/05/18 1925 04/05/18 2217   04/05/18 1930  metroNIDAZOLE (FLAGYL) IVPB 500 mg     500 mg 100 mL/hr over 60 Minutes Intravenous  Once 04/05/18 1925 04/05/18 2102      LOS: 32 days   Signature  Lala Lund M.D on 05/09/2018 at 11:05 AM   -  To page go to [email or web address] - password [facility]

## 2018-05-09 NOTE — Progress Notes (Signed)
CSW updated [facility] that patient is not ready for DC yet.   Percell Locus Sidney Kann LCSW [phone]

## 2018-05-09 NOTE — Care Management Important Message (Signed)
Important Message  Patient Details  Name: [name] MRN: [id number] Date of Birth: 03-23-1946   Medicare Important Message Given:  Yes    Thessaly Mccullers 05/09/2018, 12:20 PM

## 2018-05-09 NOTE — Progress Notes (Addendum)
[facility]/Trauma Progress Note  9 Days Post-Op   Assessment/Plan HLD 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 - on plavix (last dose 10/27) H/o CVA  Morbidly obese Chronic pain RUE DVT -was onIV heparin, now treatment dose lovenox- lovenox held 11/26 due to below Acute on chronic anemia - hgb improved today 7.7, follow  Acute perforated sigmoid diverticulitis with multiple abscesses -s/pIR perc drains x 2 - 04/16/18, culture with multiple organisms  -S/p3rdIR drain 11/13, culturegrowingFEW CANDIDA TROPICALIS  S/p Exploratory laparotomy, extensive lysis of adhesions, small bowel resection, loop ileostomy, drainage of multiple intra-abdominal abscesses, placement of wound VAC11/20/19 Dr. Georgette Dover - path negativefor malignancy - afebrile, WBC trending down (16.2 today) - midline vac MWF - single IR drain remains, and 2 JP drains placed intraoperatively  - CT 11/27 showed 3 new fluid collections that IR is not able to drain - ID involved and recommends Zosyn and fluconazole, I agree with the recommendations since IR is unable to drain intra-abdominal fluid collections seen on CT 11/27  ID -merrem 10/27-11/26, flagyl 11/16>>11/17; Zosyn 11/28>> FEN -SOFT diet, start to wean TNA. Prealbumin19.4 , ensure Foley -external cath  Plan-POpain control, abx per ID. Encourage mobilization    LOS: 32 days    Subjective: CC: Hartmans  Abdominal pain about the same. No issues overnight. Husband not at bedside.   Objective: Vital signs in last 24 hours: Temp:  [98.3 F (36.8 C)-99.3 F (37.4 C)] 98.6 F (37 C) (11/29 0530) Pulse Rate:  [79-94] 94 (11/29 0530) Resp:  [14-18] 14 (11/29 0530) BP: (109-124)/(62-64) 117/62 (11/29 0530) SpO2:  [91 %-94 %] 92 % (11/29 0530) Weight:  [113.9 kg] 113.9 kg (11/29 0500) Last BM Date: 05/08/18  Intake/Output from previous day: 11/28 0701 - 11/29 0700 In: 2787.9 [P.O.:236; IV Piggyback:2551.9] Out: 1351  [Urine:1050; Stool:301] Intake/Output this shift: No intake/output data recorded.  PE: Gen:  Alert, NAD, pleasant, cooperative Pulm:  Rate and effort normal Abd: Soft, obese, ND, +BS, midline with vac in place, R drain with yellow cloudy drainage, L sided surgical drains x 2 (1 with serosanguinous drainage, one with dark brown cloudy drainage) mild generalized TTP without guarding, no peritonitis  Skin: no rashes noted, warm and dry  Anti-infectives: Anti-infectives (From admission, onward)   Start     Dose/Rate Route Frequency Ordered Stop   05/08/18 0815  piperacillin-tazobactam (ZOSYN) IVPB 3.375 g     3.375 g 12.5 mL/hr over 240 Minutes Intravenous Every 8 hours 05/08/18 0801     05/05/18 1245  meropenem (MERREM) 1 g in sodium chloride 0.9 % 100 mL IVPB  Status:  Discontinued     1 g 200 mL/hr over 30 Minutes Intravenous Every 8 hours 05/05/18 1245 05/06/18 1006   05/01/18 1600  meropenem (MERREM) 1 g in sodium chloride 0.9 % 100 mL IVPB  Status:  Discontinued     1 g 200 mL/hr over 30 Minutes Intravenous Every 8 hours 05/01/18 1539 05/05/18 1243   04/30/18 1215  fluconazole (DIFLUCAN) IVPB 400 mg  Status:  Discontinued     400 mg 100 mL/hr over 120 Minutes Intravenous To Surgery 04/30/18 1200 05/01/18 1215   04/29/18 1100  fluconazole (DIFLUCAN) IVPB 400 mg     400 mg 100 mL/hr over 120 Minutes Intravenous Every 24 hours 04/29/18 1006 05/27/18 0959   04/27/18 1500  meropenem (MERREM) 1 g in sodium chloride 0.9 % 100 mL IVPB  Status:  Discontinued     1 g 200 mL/hr over 30  Minutes Intravenous Every 12 hours 04/27/18 1008 05/01/18 1539   04/26/18 1300  metroNIDAZOLE (FLAGYL) IVPB 500 mg  Status:  Discontinued     500 mg 100 mL/hr over 60 Minutes Intravenous Every 6 hours 04/26/18 1257 04/27/18 1041   04/15/18 1700  meropenem (MERREM) 1 g in sodium chloride 0.9 % 100 mL IVPB  Status:  Discontinued     1 g 200 mL/hr over 30 Minutes Intravenous Every 8 hours 04/15/18 1534 04/27/18  1008   04/09/18 2200  meropenem (MERREM) 1 g in sodium chloride 0.9 % 100 mL IVPB  Status:  Discontinued     1 g 200 mL/hr over 30 Minutes Intravenous Every 8 hours 04/09/18 1244 04/14/18 0944   04/06/18 1300  meropenem (MERREM) 1 g in sodium chloride 0.9 % 100 mL IVPB  Status:  Discontinued     1 g 200 mL/hr over 30 Minutes Intravenous Every 12 hours 04/06/18 1145 04/09/18 1244   04/06/18 1145  meropenem (MERREM) 1 g in sodium chloride 0.9 % 100 mL IVPB  Status:  Discontinued     1 g 200 mL/hr over 30 Minutes Intravenous Every 8 hours 04/06/18 1139 04/06/18 1145   04/06/18 0900  ciprofloxacin (CIPRO) IVPB 400 mg  Status:  Discontinued     400 mg 200 mL/hr over 60 Minutes Intravenous Every 12 hours 04/06/18 0732 04/06/18 1139   04/06/18 0300  metroNIDAZOLE (FLAGYL) IVPB 500 mg  Status:  Discontinued     500 mg 100 mL/hr over 60 Minutes Intravenous Every 8 hours 04/05/18 2335 04/06/18 1139   04/05/18 1930  ciprofloxacin (CIPRO) IVPB 400 mg     400 mg 200 mL/hr over 60 Minutes Intravenous  Once 04/05/18 1925 04/05/18 2217   04/05/18 1930  metroNIDAZOLE (FLAGYL) IVPB 500 mg     500 mg 100 mL/hr over 60 Minutes Intravenous  Once 04/05/18 1925 04/05/18 2102      Lab Results:  Recent Labs    05/08/18 0349 05/09/18 0429  WBC 19.8* 16.2*  HGB 8.1* 7.9*  HCT 25.6* 24.9*  PLT 333 341   BMET Recent Labs    05/08/18 0349 05/09/18 0429  NA 138 136  K 3.6 3.3*  CL 99 101  CO2 28 28  GLUCOSE 121* 90  BUN 49* 32*  CREATININE 1.18* 1.00  CALCIUM 7.9* 7.8*   PT/INR No results for input(s): LABPROT, INR in the last 72 hours. CMP     Component Value Date/Time   NA 136 05/09/2018 0429   K 3.3 (L) 05/09/2018 0429   CL 101 05/09/2018 0429   CO2 28 05/09/2018 0429   GLUCOSE 90 05/09/2018 0429   BUN 32 (H) 05/09/2018 0429   CREATININE 1.00 05/09/2018 0429   CALCIUM 7.8 (L) 05/09/2018 0429   PROT 5.0 (L) 05/07/2018 0348   ALBUMIN 1.8 (L) 05/07/2018 0348   AST 44 (H) 05/07/2018  0348   ALT 42 05/07/2018 0348   ALKPHOS 101 05/07/2018 0348   BILITOT 0.6 05/07/2018 0348   GFRNONAA 56 (L) 05/09/2018 0429   GFRAA >60 05/09/2018 0429   Lipase  No results found for: LIPASE  Studies/Results: Ct Abdomen Pelvis W Contrast  Result Date: 05/07/2018 CLINICAL DATA:  Abdominal pain, fever, abscess suspected. Partial bowel obstruction 8 days ago. EXAM: CT ABDOMEN AND PELVIS WITH CONTRAST TECHNIQUE: Multidetector CT imaging of the abdomen and pelvis was performed using the standard protocol following bolus administration of intravenous contrast. CONTRAST:  187mL OMNIPAQUE IOHEXOL 300 MG/ML  SOLN COMPARISON:  CTA abdomen pelvis 04/27/2018 FINDINGS: Lower chest: Dependent airspace disease is worse right than left. Calcified granulomas are present at the left base. No other significant airspace disease is present. Heart size is normal. No significant pleural or pericardial effusion is present. Hepatobiliary: There is diffuse fatty infiltration of liver. Cholecystectomy is noted. No focal hepatic lesions are present. The common bile duct is within normal limits. Pancreas: The pancreas is atrophic. No discrete lesions are present Spleen: Normal in size without focal abnormality. Adrenals/Urinary Tract: Adrenal glands are normal bilaterally. A 3 mm nonobstructing stone is stable at the lower pole of the right kidney. No other significant stones are present. There is no mass lesion. Minimal fluid is present about both kidneys. Ureters are within normal limits. The urinary bladder is within normal limits. Stomach/Bowel: The stomach and duodenum are within normal limits. The proximal small bowel demonstrates some inflammatory change. There is fluid within the small bowel mesentery in some wall thickening. Right lower quadrant ileostomy demonstrates contrast material. No obstruction is present. The terminal ileum is decompressed. Cecum and ascending colon are mostly decompressed. There are some  diverticular changes within the transverse colon. Additional diverticular changes are present in the descending and sigmoid colon. Sigmoid colon is mostly collapsed. A pigtail drainage catheter in the right lower quadrant is stable. There is no significant fluid collection associated. A straight drain extends into the anatomic pelvis via a left lower quadrant approach. Enlarging fluid collection along the lateral peritoneal wall and the left lower quadrant now measures 5.2 x 2.0 x 2.6 cm. A fluid collection along the distal duodenum is increased in size, now measuring 3.5 x 2.2 x 5.0 cm. The collection along the pharynx limb of the loop ileostomy in the right lower quadrant measures 2.0 by 3.2 x 7.1 cm. There is some free fluid without other focal collections. Vascular/Lymphatic: Atherosclerotic calcifications are present in the aorta and branch vessels without aneurysm. No significant adenopathy is present. Reproductive: Uterus and bilateral adnexa are unremarkable. Other: Laparotomy wound is healing by secondary intention. The deep portion of the wound is closed. No significant ventral hernias are present. Fluid is described above. Musculoskeletal: Degenerative anterolisthesis is again noted at L4-5. Multilevel facet degenerative changes are noted. Vertebral body heights are normal. Hips are located and within normal limits. Pelvis is unremarkable. IMPRESSION: 1. 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 Diverticular changes throughout decompressed bowel without definite focal inflammation. There is some wall thickening in the sigmoid colon. 2. Existing drainage catheters are not associated with any significant residual fluid collections. 3. Three separate fluid collections are increasing, as described. 4.  Aortic Atherosclerosis (ICD10-I70.0). 5. Bibasilar airspace disease is likely atelectasis. Electronically Signed   By: San Morelle M.D.   On: 05/07/2018 13:54   Vas [place] Upper  Extremity Venous Duplex  Result Date: 05/08/2018 UPPER VENOUS STUDY  Indications: Follow up of DVT found in one of the brachial veins 04/15/18 Performing Technologist: Sharion Dove RVS  Examination Guidelines: A complete evaluation includes B-mode imaging, spectral Doppler, color Doppler, and power Doppler as needed of all accessible portions of each vessel. Bilateral testing is considered an integral part of a complete examination. Limited examinations for reoccurring indications may be performed as noted.  Right Findings: +----------+------------+----------+---------+-----------+-----------------+ RIGHT     CompressiblePropertiesPhasicitySpontaneous     Summary      +----------+------------+----------+---------+-----------+-----------------+ IJV           Full  Yes       Yes                      +----------+------------+----------+---------+-----------+-----------------+ Subclavian                         Yes       Yes                      +----------+------------+----------+---------+-----------+-----------------+ Axillary      None                                  Age Indeterminate +----------+------------+----------+---------+-----------+-----------------+ Brachial    Partial                                 Age Indeterminate +----------+------------+----------+---------+-----------+-----------------+ Cephalic      Full                                                    +----------+------------+----------+---------+-----------+-----------------+ Basilic       Full                                                    +----------+------------+----------+---------+-----------+-----------------+  Left Findings: +----------+------------+----------+---------+-----------+-------+ LEFT      CompressiblePropertiesPhasicitySpontaneousSummary +----------+------------+----------+---------+-----------+-------+ Subclavian                         Yes        Yes            +----------+------------+----------+---------+-----------+-------+  Summary:  Right: Findings consistent with age indeterminate deep vein thrombosis involving the right brachial veins and right axillary vein. DVT found in one of the brachial veins, 04/15/18, remains. DVT appears to have propagated in to the axillary vein.  Left: No evidence of thrombosis in the subclavian.  *See table(s) above for measurements and observations.  Diagnosing physician: Servando Snare MD Electronically signed by Servando Snare MD on 05/08/2018 at 6:00:08 PM.    Final       Kalman Drape , [facility] 05/09/2018, 8:16 AM  Pager: [phone] Mon-Wed, Friday 7:00am-4:30pm Thurs 7am-11:30am  Consults: [phone]  I have seen and examined this patient and agree with the assessment and plan.  Doing well - feeling better each day. Continue IV abx today.   Sharon Mt. Dema Severin, M.D. [facility]

## 2018-05-09 NOTE — Progress Notes (Signed)
Physical Therapy Treatment Patient Details Name: [name] MRN: [id number] DOB: [birth year] Today's Date: 05/09/2018    History of Present Illness [name]  is a 71 y.o. female with PMH including but not limited to hypertension, hyperlipidemia, dm2, pvd, CVA, OSA, Rheumatoid arthritis with c/o LLQ pain over the past several weeks. Pt found to have acute perforated sigmoid diverticulitis with multiple abscesses. Pt is s/p abdominal abscess drains X2.     PT Comments    Pt making slow progress with functional mobility but able to tolerate transfers and taking side steps at EOB with mod A. Pt very limited secondary to abdominal pain and generalized weakness. Pt would continue to benefit from skilled physical therapy services at this time while admitted and after d/c to address the below listed limitations in order to improve overall safety and independence with functional mobility.    Follow Up Recommendations  SNF     Equipment Recommendations  None recommended by PT    Recommendations for Other Services       Precautions / Restrictions Precautions Precautions: Fall;Other (comment) Precaution Comments: 2 L LQ jp drains, 1 R LQ drain, abdominal wound VAC Restrictions Weight Bearing Restrictions: No    Mobility  Bed Mobility Overal bed mobility: Needs Assistance Bed Mobility: Supine to Sit;Sit to Supine     Supine to sit: Mod assist Sit to supine: Mod assist;+2 for physical assistance   General bed mobility comments: increased time and effort, assist for trunk elevation and assist with bilateral LE return to bed  Transfers Overall transfer level: Needs assistance Equipment used: Rolling walker (2 wheeled) Transfers: Sit to/from Stand Sit to Stand: From elevated surface;Mod assist;+2 physical assistance         General transfer comment: increased time and effort, use of momentum, and mod A to power into standing from an elevated  bed  Ambulation/Gait Ambulation/Gait assistance: Mod assist;+2 physical assistance   Assistive device: Rolling walker (2 wheeled)       General Gait Details: pt able to take 4 lateral steps at EOB with mod A x2 with use of RW; pt very limited secondary to weakness and fatigue   Stairs             Wheelchair Mobility    Modified Rankin (Stroke Patients Only)       Balance Overall balance assessment: Needs assistance Sitting-balance support: Feet supported Sitting balance-Leahy Scale: Fair     Standing balance support: Bilateral upper extremity supported Standing balance-Leahy Scale: Poor Standing balance comment: Pt reliant on RW and external assist                            Cognition Arousal/Alertness: Awake/alert Behavior During Therapy: WFL for tasks assessed/performed Overall Cognitive Status: Within Functional Limits for tasks assessed                                        Exercises General Exercises - Lower Extremity Ankle Circles/Pumps: AROM;Both;20 reps;Supine Quad Sets: AROM;Both;10 reps;Supine Short Arc Quad: AROM;Both;10 reps;Supine Heel Slides: AROM;Both;Supine Hip ABduction/ADduction: AROM;Both;10 reps;Supine    General Comments        Pertinent Vitals/Pain Pain Assessment: 0-10 Pain Score: 10-Worst pain ever Pain Location: Abdomen Pain Descriptors / Indicators: Moaning;Discomfort;Grimacing;Constant Pain Intervention(s): Monitored during session;Repositioned;Patient requesting pain meds-RN notified;RN gave pain meds during session    Home Living  Prior Function            PT Goals (current goals can now be found in the care plan section) Acute Rehab PT Goals PT Goal Formulation: With patient/family Time For Goal Achievement: 05/23/18(goals updated on 05/09/18) Potential to Achieve Goals: Fair Progress towards PT goals: Progressing toward goals    Frequency    Min  3X/week      PT Plan Current plan remains appropriate    Co-evaluation              AM-PAC PT "6 Clicks" Mobility   Outcome Measure  Help needed turning from your back to your side while in a flat bed without using bedrails?: A Lot Help needed moving from lying on your back to sitting on the side of a flat bed without using bedrails?: A Lot Help needed moving to and from a bed to a chair (including a wheelchair)?: A Lot Help needed standing up from a chair using your arms (e.g., wheelchair or bedside chair)?: A Lot Help needed to walk in hospital room?: A Lot Help needed climbing 3-5 steps with a railing? : Total 6 Click Score: 11    End of Session Equipment Utilized During Treatment: Gait belt Activity Tolerance: Patient limited by fatigue;Patient limited by pain Patient left: in bed;with call bell/phone within reach;with family/visitor present Nurse Communication: Mobility status PT Visit Diagnosis: Other abnormalities of gait and mobility (R26.89);Muscle weakness (generalized) (M62.81);Pain Pain - part of body: (abdomen)     Time: 1324-4010 PT Time Calculation (min) (ACUTE ONLY): 57 min  Charges:  $Therapeutic Activity: 53-67 mins                     Sherie Don, PT, DPT  Acute Rehabilitation Services Pager [phone] Office [place] 05/09/2018, 4:05 PM

## 2018-05-10 LAB — AEROBIC CULTURE  (SUPERFICIAL SPECIMEN)

## 2018-05-10 LAB — BASIC METABOLIC PANEL
Anion gap: 13 (ref 5–15)
BUN: 22 mg/dL (ref 8–23)
CO2: 26 mmol/L (ref 22–32)
Calcium: 7.8 mg/dL — ABNORMAL LOW (ref 8.9–10.3)
Chloride: 99 mmol/L (ref 98–111)
Creatinine, Ser: 1 mg/dL (ref 0.44–1.00)
GFR calc Af Amer: >60 mL/min (ref >60)
GFR calc non Af Amer: 56 mL/min — ABNORMAL LOW (ref >60)
Glucose, Bld: 95 mg/dL (ref 70–99)
Potassium: 3.7 mmol/L (ref 3.5–5.1)
Sodium: 138 mmol/L (ref 135–145)

## 2018-05-10 LAB — CBC
HCT: 25.9 % — ABNORMAL LOW (ref 36.0–46.0)
HEMOGLOBIN: 7.9 g/dL — AB (ref 12.0–15.0)
MCH: 28.1 pg (ref 26.0–34.0)
MCHC: 30.5 g/dL (ref 30.0–36.0)
MCV: 92.2 fL (ref 80.0–100.0)
Platelets: 322 10*3/uL (ref 150–400)
RBC: 2.81 MIL/uL — ABNORMAL LOW (ref 3.87–5.11)
RDW: 18.1 % — ABNORMAL HIGH (ref 11.5–15.5)
WBC: 13.8 10*3/uL — ABNORMAL HIGH (ref 4.0–10.5)
nRBC: 1.2 % — ABNORMAL HIGH (ref 0.0–0.2)

## 2018-05-10 LAB — HEPARIN LEVEL (UNFRACTIONATED)
Heparin Unfractionated: 0.54 IU/mL (ref 0.30–0.70)
Heparin Unfractionated: 0.52 IU/mL (ref 0.30–0.70)

## 2018-05-10 LAB — GLUCOSE, CAPILLARY
Glucose-Capillary: 83 mg/dL (ref 70–99)
Glucose-Capillary: 108 mg/dL — ABNORMAL HIGH (ref 70–99)
Glucose-Capillary: 89 mg/dL (ref 70–99)
Glucose-Capillary: 99 mg/dL (ref 70–99)

## 2018-05-10 LAB — AEROBIC CULTURE W GRAM STAIN (SUPERFICIAL SPECIMEN)

## 2018-05-10 LAB — MAGNESIUM: MAGNESIUM: 1.4 mg/dL — AB (ref 1.7–2.4)

## 2018-05-10 MED ORDER — SODIUM CHLORIDE 0.9 % IV BOLUS
500.0000 mL | Freq: Once | INTRAVENOUS | Status: AC
  Administered 2018-05-11: 500 mL via INTRAVENOUS

## 2018-05-10 MED ORDER — KETOROLAC TROMETHAMINE 15 MG/ML IJ SOLN
15.0000 mg | Freq: Once | INTRAMUSCULAR | Status: AC
  Administered 2018-05-10: 15 mg via INTRAVENOUS
  Filled 2018-05-10: qty 1

## 2018-05-10 MED ORDER — MAGNESIUM SULFATE 4 GM/100ML IV SOLN
4.0000 g | Freq: Once | INTRAVENOUS | Status: AC
  Administered 2018-05-10: 4 g via INTRAVENOUS
  Filled 2018-05-10 (×2): qty 100

## 2018-05-10 NOTE — Progress Notes (Signed)
Pt sat in recliner from 12:00 noon until 14:45, tolerated well, with pain starting to intensify after 2.5 hours. Pt needed to use the steady with assist from 3 people to transfer from chair to bed and vice versa.

## 2018-05-10 NOTE — Progress Notes (Signed)
[place] for heparin Indication: acute DVT  Allergies  Allergen Reactions  . Lactose Intolerance (Gi) Other (See Comments)    G.I. Upset  . Butrans [Buprenorphine] Rash and Other (See Comments)    Infected skin underneath application  . Penicillins Rash    Facial rash Has patient had a PCN reaction causing immediate rash, facial/tongue/throat swelling, SOB or lightheadedness with hypotension: Yes Has patient had a PCN reaction causing severe rash involving mucus membranes or skin necrosis: No Has patient had a PCN reaction that required hospitalization No Has patient had a PCN reaction occurring within the last 10 years: Yes If all of the above answers are "NO", then may proceed with Cephalosporin use.   . Simvastatin Rash    Patient Measurements: Height: 5' 5.5" (166.4 cm) Weight: 239 lb 10.2 oz (108.7 kg) IBW/kg (Calculated) : 58.15 Heparin Dosing Weight: 85 kg  Vital Signs: Temp: 98.1 F (36.7 C) (11/30 2143) Temp Source: Oral (11/30 2143) BP: 127/62 (11/30 2143) Pulse Rate: 96 (11/30 2143)  Labs: Recent Labs    05/08/18 0349 05/09/18 0429 05/09/18 2223 05/10/18 0906 05/10/18 2043  HGB 8.1* 7.9*  --  7.9*  --   HCT 25.6* 24.9*  --  25.9*  --   PLT 333 341  --  322  --   HEPARINUNFRC  --   --  0.59 0.54 0.52  CREATININE 1.18* 1.00  --  1.00  --     Estimated Creatinine Clearance: 62 mL/min (by C-G formula based on SCr of 1 mg/dL).   Medical History: Past Medical History:  Diagnosis Date  . Anginal pain [facility])    sees Dr Einar Gip.   . Arthritis    rheumatoid ..   . Diabetes mellitus without complication (HCC)    Metformin 2.3.2017  . Diverticulitis   . Diverticulitis of large intestine with perforation and abscess 04/05/2018  . Dysrhythmia   . Fibromyalgia   . GERD (gastroesophageal reflux disease)   . High cholesterol   . Hypertension   . Liver disease 08- 2012   per Dr Dagmar Hait pt has enlarged liver  . Peripheral  vascular disease (HCC)    legs  . Pneumonia 06/17/2016  . Sleep apnea    sleep study  oct 2012  . Stroke [facility]) 2011   [place]      Medications:  Medications Prior to Admission  Medication Sig Dispense Refill Last Dose  . Blood Glucose Monitoring Suppl (FREESTYLE LITE) DEVI See admin instructions.  0 04/05/2018 at Unknown time  . ciprofloxacin (CIPRO) 500 MG/5ML (10%) suspension Take by mouth 2 (two) times daily.   04/05/2018 at Unknown time  . clopidogrel (PLAVIX) 75 MG tablet Take 1 tablet (75 mg total) by mouth daily with breakfast. 90 tablet 6 04/05/2018 at 0800  . Dexlansoprazole 30 MG capsule Take 40 mg by mouth daily.   04/05/2018 at Unknown time  . diclofenac sodium (VOLTAREN) 1 % GEL Apply 2 g topically 4 (four) times daily as needed (Pain).    04/05/2018 at Unknown time  . glipiZIDE (GLUCOTROL) 10 MG tablet Take 10 mg by mouth daily before breakfast.   04/05/2018 at Unknown time  . HYDROcodone-acetaminophen (NORCO) 10-325 MG tablet Take 1 tablet by mouth 4 (four) times daily.   04/05/2018 at Unknown time  . IRON PO Take 1 tablet by mouth daily.   04/05/2018 at Unknown time  . isosorbide dinitrate (ISORDIL) 5 MG tablet Take 1 tablet (5 mg total) by  mouth 3 (three) times daily before meals. 90 tablet 1 04/05/2018 at Unknown time  . leflunomide (ARAVA) 20 MG tablet Take 20 mg by mouth daily.   04/05/2018 at Unknown time  . metoprolol succinate (TOPROL-XL) 25 MG 24 hr tablet Take 25 mg by mouth daily.   04/05/2018 at 0800  . metroNIDAZOLE (FLAGYL) 250 MG tablet Take 250 mg by mouth 3 (three) times daily.   04/05/2018 at Unknown time  . Olmesartan-amLODIPine-HCTZ (TRIBENZOR) 40-5-25 MG TABS Take 1 tablet by mouth daily.    04/05/2018 at Unknown time  . pravastatin (PRAVACHOL) 40 MG tablet Take 40 mg by mouth at bedtime.   04/05/2018 at Unknown time  . predniSONE (DELTASONE) 5 MG tablet Take 5 mg by mouth daily with breakfast.   04/05/2018 at Unknown time  . pregabalin (LYRICA)  75 MG capsule Take 75 mg by mouth 3 (three) times daily.   04/05/2018 at Unknown time  . Tafluprost (ZIOPTAN) 0.0015 % SOLN Place 1 drop into both eyes at bedtime.    04/05/2018 at Unknown time  . temazepam (RESTORIL) 30 MG capsule Take 30 mg by mouth at bedtime.   04/05/2018 at Unknown time  . timolol (BETIMOL) 0.5 % ophthalmic solution Place 1 drop into both eyes 2 (two) times daily.    04/05/2018 at Unknown time  . Tofacitinib Citrate (XELJANZ) 5 MG TABS Take 1 tablet by mouth daily.   04/05/2018 at Unknown time  . VITAMIN D, ERGOCALCIFEROL, PO Take 50,000 Units by mouth once a week. Thursdays   04/05/2018 at Unknown time  . docusate sodium (COLACE) 100 MG capsule Take 2 capsules (200 mg total) by mouth at bedtime. (Patient taking differently: Take 200 mg by mouth as needed. 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 RUE duplex was rechecked which showed DVT remains and propagated. Pharmacy consulted to resume IV heparin. Will keep heparin level on low end of normal and monitor closely -heparin level= 0.52   Goal of Therapy:  Heparin level 0.3-0.5 units/ml  Monitor platelets by anticoagulation protocol: Yes   Plan:   -Decrease heparin to 1150 units/hr -Recheck in 6 hrs  Hildred Laser, PharmD Clinical Pharmacist **Pharmacist phone directory can now be found on [email or web address] ([email or web address]).  Listed under [place].

## 2018-05-10 NOTE — Progress Notes (Signed)
ANTICOAGULATION CONSULT NOTE - Initial Consult  Pharmacy Consult for heparin Indication: acute DVT  Allergies  Allergen Reactions  . Lactose Intolerance (Gi) Other (See Comments)    G.I. Upset  . Butrans [Buprenorphine] Rash and Other (See Comments)    Infected skin underneath application  . Penicillins Rash    Facial rash Has patient had a PCN reaction causing immediate rash, facial/tongue/throat swelling, SOB or lightheadedness with hypotension: Yes Has patient had a PCN reaction causing severe rash involving mucus membranes or skin necrosis: No Has patient had a PCN reaction that required hospitalization No Has patient had a PCN reaction occurring within the last 10 years: Yes If all of the above answers are "NO", then may proceed with Cephalosporin use.   . Simvastatin Rash    Patient Measurements: Height: 5' 5.5" (166.4 cm) Weight: 239 lb 10.2 oz (108.7 kg) IBW/kg (Calculated) : 58.15 Heparin Dosing Weight: 85 kg  Vital Signs: Temp: 98.1 F (36.7 C) (11/30 0427) Temp Source: Oral (11/30 0427) BP: 124/81 (11/30 0427) Pulse Rate: 98 (11/30 0427)  Labs: Recent Labs    05/08/18 0349 05/09/18 0429 05/09/18 2223 05/10/18 0906  HGB 8.1* 7.9*  --  7.9*  HCT 25.6* 24.9*  --  25.9*  PLT 333 341  --  322  HEPARINUNFRC  --   --  0.59 0.54  CREATININE 1.18* 1.00  --  1.00    Estimated Creatinine Clearance: 62 mL/min (by C-G formula based on SCr of 1 mg/dL).   Medical History: Past Medical History:  Diagnosis Date  . Anginal pain [facility])    sees Dr Einar Gip.   . Arthritis    rheumatoid ..   . Diabetes mellitus without complication (HCC)    Metformin 2.3.2017  . Diverticulitis   . Diverticulitis of large intestine with perforation and abscess 04/05/2018  . Dysrhythmia   . Fibromyalgia   . GERD (gastroesophageal reflux disease)   . High cholesterol   . Hypertension   . Liver disease 08- 2012   per Dr Dagmar Hait pt has enlarged liver  . Peripheral vascular disease (HCC)     legs  . Pneumonia 06/17/2016  . Sleep apnea    sleep study  oct 2012  . Stroke [facility]) 2011   [place]      Medications:  Medications Prior to Admission  Medication Sig Dispense Refill Last Dose  . Blood Glucose Monitoring Suppl (FREESTYLE LITE) DEVI See admin instructions.  0 04/05/2018 at Unknown time  . ciprofloxacin (CIPRO) 500 MG/5ML (10%) suspension Take by mouth 2 (two) times daily.   04/05/2018 at Unknown time  . clopidogrel (PLAVIX) 75 MG tablet Take 1 tablet (75 mg total) by mouth daily with breakfast. 90 tablet 6 04/05/2018 at 0800  . Dexlansoprazole 30 MG capsule Take 40 mg by mouth daily.   04/05/2018 at Unknown time  . diclofenac sodium (VOLTAREN) 1 % GEL Apply 2 g topically 4 (four) times daily as needed (Pain).    04/05/2018 at Unknown time  . glipiZIDE (GLUCOTROL) 10 MG tablet Take 10 mg by mouth daily before breakfast.   04/05/2018 at Unknown time  . HYDROcodone-acetaminophen (NORCO) 10-325 MG tablet Take 1 tablet by mouth 4 (four) times daily.   04/05/2018 at Unknown time  . IRON PO Take 1 tablet by mouth daily.   04/05/2018 at Unknown time  . isosorbide dinitrate (ISORDIL) 5 MG tablet Take 1 tablet (5 mg total) by mouth 3 (three) times daily before meals. 90 tablet 1 04/05/2018 at  Unknown time  . leflunomide (ARAVA) 20 MG tablet Take 20 mg by mouth daily.   04/05/2018 at Unknown time  . metoprolol succinate (TOPROL-XL) 25 MG 24 hr tablet Take 25 mg by mouth daily.   04/05/2018 at 0800  . metroNIDAZOLE (FLAGYL) 250 MG tablet Take 250 mg by mouth 3 (three) times daily.   04/05/2018 at Unknown time  . Olmesartan-amLODIPine-HCTZ (TRIBENZOR) 40-5-25 MG TABS Take 1 tablet by mouth daily.    04/05/2018 at Unknown time  . pravastatin (PRAVACHOL) 40 MG tablet Take 40 mg by mouth at bedtime.   04/05/2018 at Unknown time  . predniSONE (DELTASONE) 5 MG tablet Take 5 mg by mouth daily with breakfast.   04/05/2018 at Unknown time  . pregabalin (LYRICA) 75 MG capsule Take 75  mg by mouth 3 (three) times daily.   04/05/2018 at Unknown time  . Tafluprost (ZIOPTAN) 0.0015 % SOLN Place 1 drop into both eyes at bedtime.    04/05/2018 at Unknown time  . temazepam (RESTORIL) 30 MG capsule Take 30 mg by mouth at bedtime.   04/05/2018 at Unknown time  . timolol (BETIMOL) 0.5 % ophthalmic solution Place 1 drop into both eyes 2 (two) times daily.    04/05/2018 at Unknown time  . Tofacitinib Citrate (XELJANZ) 5 MG TABS Take 1 tablet by mouth daily.   04/05/2018 at Unknown time  . VITAMIN D, ERGOCALCIFEROL, PO Take 50,000 Units by mouth once a week. Thursdays   04/05/2018 at Unknown time  . docusate sodium (COLACE) 100 MG capsule Take 2 capsules (200 mg total) by mouth at bedtime. (Patient taking differently: Take 200 mg by mouth as needed. 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 RUE duplex was rechecked which showed DVT remains and propagated. Pharmacy consulted to resume IV heparin. Hgb remains low at 7.9 and MD notes ileostomy output appears dark - will keep heparin level on low end of normal and monitor closely; 1 unit PRBC given 11/27. Previous heparin levels were mostly in the 0.3-0.5s range on 1500 units/hr.   HL still above the low goal this AM. We will adjust rate again.   Goal of Therapy:  Heparin level 0.3-0.5 units/ml  Monitor platelets by anticoagulation protocol: Yes   Plan:   Decrease heparin to 1250 units/hr F/u with 6 hr level  Onnie Boer, PharmD, BCIDP, AAHIVP, CPP Infectious Disease Pharmacist 05/10/2018 11:29 AM

## 2018-05-10 NOTE — Progress Notes (Signed)
Coag labs drawn from the PICC after pharmacy instructions to turn off Heparin drip, flush line and draw from the other lumen. Pt has bilateral arm restrictions.

## 2018-05-10 NOTE — Progress Notes (Signed)
ANTICOAGULATION CONSULT NOTE - Follow Up Consult  Pharmacy Consult for heparin Indication: DVT in setting of blood-loss anemia   Labs: Recent Labs    05/07/18 0348 05/07/18 2219 05/08/18 0349 05/09/18 0429 05/09/18 2223  HGB 7.7* 8.3* 8.1* 7.9*  --   HCT 24.7* 25.1* 25.6* 24.9*  --   PLT 334  --  333 341  --   HEPARINUNFRC  --   --   --   --  0.59  CREATININE 1.12*  --  1.18* 1.00  --     Assessment: 72yo female supratherapeutic on heparin with initial dosing after resuming heparin for DVT; no gtt issues or signs of bleeding.  Goal of Therapy:  Heparin level 0.3-0.5 units/ml   Plan:  Will decrease heparin gtt by 1 unit/kg/hr to 1400 units/hr and check level in 8 hours.    Wynona Neat, PharmD, BCPS  05/10/2018,12:55 AM

## 2018-05-10 NOTE — Plan of Care (Signed)
  Problem: Safety: Goal: Ability to remain free from injury will improve Outcome: Progressing   

## 2018-05-10 NOTE — Progress Notes (Signed)
[facility] Progress Note  10 Days Post-Op  Subjective: CC:  No new complaints, feels she is not getting better quickly.   Objective: Vital signs in last 24 hours: Temp:  [98.1 F (36.7 C)-98.6 F (37 C)] 98.1 F (36.7 C) (11/30 0427) Pulse Rate:  [82-98] 98 (11/30 0427) Resp:  [18-19] 18 (11/30 0427) BP: (124-135)/(67-81) 124/81 (11/30 0427) SpO2:  [94 %] 94 % (11/30 0427) Weight:  [108.7 kg] 108.7 kg (11/30 0314) Last BM Date: 05/09/18  Intake/Output from previous day: 11/29 0701 - 11/30 0700 In: 929.7 [P.O.:340; I.V.:224.9; IV Piggyback:364.8] Out: 1505 [Urine:400; Drains:155; Stool:950] Intake/Output this shift: Total I/O In: 130 [P.O.:130] Out: 500 [Stool:500]  PE: Gen:  Alert, NAD, pleasant Pulm:  Normal effort Abd: Soft, approp tender, stool in ileostomy pouch  LUQ - 155/24h brown/green   LLQ  R IR drain  Skin: warm and dry, no rashes  Psych: A&Ox3   Lab Results:  Recent Labs    05/09/18 0429 05/10/18 0906  WBC 16.2* 13.8*  HGB 7.9* 7.9*  HCT 24.9* 25.9*  PLT 341 322   BMET Recent Labs    05/09/18 0429 05/10/18 0906  NA 136 138  K 3.3* 3.7  CL 101 99  CO2 28 26  GLUCOSE 90 95  BUN 32* 22  CREATININE 1.00 1.00  CALCIUM 7.8* 7.8*   PT/INR No results for input(s): LABPROT, INR in the last 72 hours. CMP     Component Value Date/Time   NA 138 05/10/2018 0906   K 3.7 05/10/2018 0906   CL 99 05/10/2018 0906   CO2 26 05/10/2018 0906   GLUCOSE 95 05/10/2018 0906   BUN 22 05/10/2018 0906   CREATININE 1.00 05/10/2018 0906   CALCIUM 7.8 (L) 05/10/2018 0906   PROT 5.0 (L) 05/07/2018 0348   ALBUMIN 1.8 (L) 05/07/2018 0348   AST 44 (H) 05/07/2018 0348   ALT 42 05/07/2018 0348   ALKPHOS 101 05/07/2018 0348   BILITOT 0.6 05/07/2018 0348   GFRNONAA 56 (L) 05/10/2018 0906   GFRAA >60 05/10/2018 0906   Lipase  No results found for: LIPASE     Studies/Results: Vas [place] Upper Extremity Venous Duplex  Result Date:  05/08/2018 UPPER VENOUS STUDY  Indications: Follow up of DVT found in one of the brachial veins 04/15/18 Performing Technologist: Sharion Dove RVS  Examination Guidelines: A complete evaluation includes B-mode imaging, spectral Doppler, color Doppler, and power Doppler as needed of all accessible portions of each vessel. Bilateral testing is considered an integral part of a complete examination. Limited examinations for reoccurring indications may be performed as noted.  Right Findings: +----------+------------+----------+---------+-----------+-----------------+ RIGHT     CompressiblePropertiesPhasicitySpontaneous     Summary      +----------+------------+----------+---------+-----------+-----------------+ IJV           Full                 Yes       Yes                      +----------+------------+----------+---------+-----------+-----------------+ Subclavian                         Yes       Yes                      +----------+------------+----------+---------+-----------+-----------------+ Axillary      None  Age Indeterminate +----------+------------+----------+---------+-----------+-----------------+ Brachial    Partial                                 Age Indeterminate +----------+------------+----------+---------+-----------+-----------------+ Cephalic      Full                                                    +----------+------------+----------+---------+-----------+-----------------+ Basilic       Full                                                    +----------+------------+----------+---------+-----------+-----------------+  Left Findings: +----------+------------+----------+---------+-----------+-------+ LEFT      CompressiblePropertiesPhasicitySpontaneousSummary +----------+------------+----------+---------+-----------+-------+ Subclavian                         Yes       Yes             +----------+------------+----------+---------+-----------+-------+  Summary:  Right: Findings consistent with age indeterminate deep vein thrombosis involving the right brachial veins and right axillary vein. DVT found in one of the brachial veins, 04/15/18, remains. DVT appears to have propagated in to the axillary vein.  Left: No evidence of thrombosis in the subclavian.  *See table(s) above for measurements and observations.  Diagnosing physician: Servando Snare MD Electronically signed by Servando Snare MD on 05/08/2018 at 6:00:08 PM.    Final     Anti-infectives: Anti-infectives (From admission, onward)   Start     Dose/Rate Route Frequency Ordered Stop   05/08/18 0815  piperacillin-tazobactam (ZOSYN) IVPB 3.375 g     3.375 g 12.5 mL/hr over 240 Minutes Intravenous Every 8 hours 05/08/18 0801     05/05/18 1245  meropenem (MERREM) 1 g in sodium chloride 0.9 % 100 mL IVPB  Status:  Discontinued     1 g 200 mL/hr over 30 Minutes Intravenous Every 8 hours 05/05/18 1245 05/06/18 1006   05/01/18 1600  meropenem (MERREM) 1 g in sodium chloride 0.9 % 100 mL IVPB  Status:  Discontinued     1 g 200 mL/hr over 30 Minutes Intravenous Every 8 hours 05/01/18 1539 05/05/18 1243   04/30/18 1215  fluconazole (DIFLUCAN) IVPB 400 mg  Status:  Discontinued     400 mg 100 mL/hr over 120 Minutes Intravenous To Surgery 04/30/18 1200 05/01/18 1215   04/29/18 1100  fluconazole (DIFLUCAN) IVPB 400 mg     400 mg 100 mL/hr over 120 Minutes Intravenous Every 24 hours 04/29/18 1006 05/27/18 0959   04/27/18 1500  meropenem (MERREM) 1 g in sodium chloride 0.9 % 100 mL IVPB  Status:  Discontinued     1 g 200 mL/hr over 30 Minutes Intravenous Every 12 hours 04/27/18 1008 05/01/18 1539   04/26/18 1300  metroNIDAZOLE (FLAGYL) IVPB 500 mg  Status:  Discontinued     500 mg 100 mL/hr over 60 Minutes Intravenous Every 6 hours 04/26/18 1257 04/27/18 1041   04/15/18 1700  meropenem (MERREM) 1 g in sodium chloride 0.9 % 100 mL IVPB   Status:  Discontinued     1 g 200 mL/hr over 30 Minutes Intravenous Every 8 hours  04/15/18 1534 04/27/18 1008   04/09/18 2200  meropenem (MERREM) 1 g in sodium chloride 0.9 % 100 mL IVPB  Status:  Discontinued     1 g 200 mL/hr over 30 Minutes Intravenous Every 8 hours 04/09/18 1244 04/14/18 0944   04/06/18 1300  meropenem (MERREM) 1 g in sodium chloride 0.9 % 100 mL IVPB  Status:  Discontinued     1 g 200 mL/hr over 30 Minutes Intravenous Every 12 hours 04/06/18 1145 04/09/18 1244   04/06/18 1145  meropenem (MERREM) 1 g in sodium chloride 0.9 % 100 mL IVPB  Status:  Discontinued     1 g 200 mL/hr over 30 Minutes Intravenous Every 8 hours 04/06/18 1139 04/06/18 1145   04/06/18 0900  ciprofloxacin (CIPRO) IVPB 400 mg  Status:  Discontinued     400 mg 200 mL/hr over 60 Minutes Intravenous Every 12 hours 04/06/18 0732 04/06/18 1139   04/06/18 0300  metroNIDAZOLE (FLAGYL) IVPB 500 mg  Status:  Discontinued     500 mg 100 mL/hr over 60 Minutes Intravenous Every 8 hours 04/05/18 2335 04/06/18 1139   04/05/18 1930  ciprofloxacin (CIPRO) IVPB 400 mg     400 mg 200 mL/hr over 60 Minutes Intravenous  Once 04/05/18 1925 04/05/18 2217   04/05/18 1930  metroNIDAZOLE (FLAGYL) IVPB 500 mg     500 mg 100 mL/hr over 60 Minutes Intravenous  Once 04/05/18 1925 04/05/18 2102       Assessment/Plan HLD 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 Georgette Dover -path negativefor malignancy - afebrile, WBC trending down (13today) - midline vac MWF - single IR drain remains, and 2 JP drains placed intraoperatively ; LUQ surgical drain now with enteric contents, follow. This drain terminates in the pelvis  - CT 11/27 showed 3 new fluid collections that IR is not able to drain - ID involved and recommends Zosyn and fluconazole, I agree with the recommendations since IR is unable to drain intra-abdominal fluid collections seen on CT 11/27  ID -merrem 10/27-11/26, flagyl 11/16>>11/17;Zosyn 11/28>> FEN -SOFT diet, start to wean TNA. Prealbumin19.4 , ensure Foley -external cath  Plan-POpain control, abx per ID. Encourage mobilization   LOS: 33 days    Obie Dredge, [facility] Pager: [phone]

## 2018-05-10 NOTE — Progress Notes (Signed)
PROGRESS NOTE    [name]  ASN:[id number] DOB: 10-Jan-1946 DOA: 04/05/2018 PCP: [name], MD    Brief Narrative:   72 year old female with past medical history of hypertension, type 2 diabetes mellitus chronic kidney disease rheumatoid arthritis coronary artery disease S/P CABG on Plavix who was admitted with abdominal pain and found to have sigmoid diverticulitis on April 05, 2018 with perforation and abscess.  Initially was felt to be improving on IV Merrem but she continued to have abdominal pain. CT scan of the abdomen and pelvis was obtained April 15, 2018 and was significant for worsening diverticulitis with abdominal fluid collections, she was transferred to [facility] for IR evaluation, he did receive total of 3 percutaneous drains, with repeat CT abdomen pelvis 04/27/2018, still showing remaining fluid collection/abscess despite he strains, and appropriate antibiotic coverage, patient went for exploratory laparotomy with lysis of adhesion, small bowel resection with loop ileostomy, and placement of wound VAC by Dr. Georgette Dover 04/30/2018.  SUBJECTIVE:  Patient in bed, appears comfortable, denies any headache, no fever, no chest pain or pressure, no shortness of breath , no abdominal pain. No focal weakness.  Assessment & Plan:    Perforated sigmoid diverticulitis/abdominal abscess : General surgery following, initially managed conservatively with multiple abdominal drain placements however repeat CT scan on 04/27/2018 showed worsening of abscess and disease process after which she was taken to the OR for laparotomy with lysis of adhesion, small bowel resection, loop ileostomy and placement of wound VAC by Dr. Georgette Dover on 04/30/2018.  Abscess cultures noted which are growing Candida, Clostridium on with Peptostreptococcus.  She had already finished 4 weeks of meropenem on 05/06/2018.  At this time ID and general surgery will following, repeat CT scan on 05/07/2018 shows multiple new  collections of abscess in the abdomen, defer management to ID and general surgery apparently IR unable to reach these new collections, again will defer to general surgery on management of these fluid collections/abscess, she has currently been started on Zosyn per ID on 05/08/2018, IV Diflucan being continued with start date of 04/29/2018.      Acute right upper extremity DVT extremity  -  due to PICC line this was just discontinued April 16, 2018 was on full dose Lovenox for anticoagulation as well however her H&H dropped and her anticoagulation had to be held on 05/06/2018, repeated right upper extremity ultrasound on 05/08/2018 which shows her DVTs extending hence had to resume heparin drip per pharmacy on 05/09/18 continue to monitor.  Anemia of critical illness.  Some element of dilution but her ileostomy output appears dark, despite holding PNA and diuresing her with Lasix her H&H is going down suggesting some ongoing blood loss, continue IV PPI, anemia panel was inconclusive she received 1 unit of packed RBC on 05/07/2018 with stabilization of H&H.  Will reinitiate heparin drip with caution and continue to monitor H&H.  Acute kidney injury with chronic kidney disease stage III  - recent creatinine of 1.2, ARF resolved currently close to baseline.  Fibromyalgia - continue Lyrica able to take oral  Hypertension improved.  - BP soft, low dose B Blocker.  Advanced weakness and deconditioning -   PT has been consulted they recommend SNF.  Patient encouraged to sit up in the chair rather than laying in bed.  Counseled patient and husband multiple times including 05/07/2018, 05/08/2018.  Acute illness required anemia on chronic anemia  - she required total of 4 units PRBC transfusion during hospital stay .monitor closely and  transfuse as needed .  Depression patient recently started on Lexapro  Paroxysmal atrial tachycardia.  She has not had any more episodes of PAT since that of April 22, 2018 echocardiogram that was done during showed preserved ejection fraction she has stable TSH, on low-dose Cardizem her blood pressure too low to adjust further, will give her loading dose of digoxin thereafter patient is on oral digoxin on 05/08/2018.  Monitor heart rate.  Hypomagnesemia - replaced IV, recheck in am.  Type 2 diabetes mellitus controlled  - Continue with insulin sliding scale every 4 hours as she is on TPN, CBG acceptable .  CBG (last 3)  Recent Labs    05/09/18 1704 05/09/18 2123 05/10/18 0802  GLUCAP 102* 81 83       DVT prophylaxis: Subcutaneous Lovenox treatment dose for right upper extremity DVT  code Status: Full Family Communication: family at bedside  05/04/18 Disposition Plan: Physical therapy recommended SNF Consultants:   General surgery  IR  ID - phone Dr Johnnye Sima 05/05/18  Procedures:  - Echocardiogram -   Left ventricle: The cavity size was normal. Wall thickness was increased in a pattern of moderate LVH. Systolic function was  vigorous. The estimated ejection fraction was in the range of 65% to 70%. Indeterminant diastolic function. Wall motion was normal; there were no regional wall motion abnormalities. - Aortic valve: Valve area (VTI): 2.74 cm^2. Valve area (Vmax):   2.58 cm^2. Valve area (Vmean): 2.56 cm^2.  -S/PIR perc drains x 2 - 04/16/18, culture with multiple organisms present/none predominant -S/P3rdIR drain 11/13, culturegrowingFEW CANDIDA TROPICALIS, - Right arm PICC line - had DVT and was remioved -  Left arm PICC line - Exploratory laparotomy with lysis of adhesion, small bowel resection with loop ileostomy, and placement of wound VAC by Dr. Georgette Dover 04/30/2018. - CT 11/27 - 1. 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 Diverticular changes throughout decompressed bowel without definite focal inflammation. There is some wall thickening in the sigmoid colon. 2. Existing drainage catheters are not associated  with any significant residual fluid collections. 3. Three separate fluid collections are increasing, as described. 4.  Aortic Atherosclerosis (ICD10-I70.0). 5. Bibasilar airspace disease is likely atelectasis.    Objective: Vitals:   05/09/18 1447 05/09/18 2125 05/10/18 0314 05/10/18 0427  BP: 130/72 135/67  124/81  Pulse: 82 87  98  Resp: 19 18  18   Temp: 98.6 F (37 C) 98.2 F (36.8 C)  98.1 F (36.7 C)  TempSrc: Oral Oral  Oral  SpO2: 94% 94%  94%  Weight:   108.7 kg   Height:        Intake/Output Summary (Last 24 hours) at 05/10/2018 1150 Last data filed at 05/10/2018 1135 Gross per 24 hour  Intake 939.68 ml  Output 1430 ml  Net -490.32 ml   Filed Weights   05/09/18 0500 05/09/18 1100 05/10/18 0314  Weight: 113.9 kg 113.9 kg 108.7 kg    Examination:  Awake Alert, Oriented X 3, No new F.N deficits, Normal affect Justice.AT,PERRAL Supple Neck,No JVD, No cervical lymphadenopathy appriciated.  Symmetrical Chest wall movement, Good air movement bilaterally, CTAB RRR,No Gallops, Rubs or new Murmurs, No Parasternal Heave +ve B.Sounds,  midline surgical wound VAC, right ileostomyileostomy bag, left JP drain with sanguinous material . No Cyanosis, Clubbing or edema, No new Rash or bruise    Data Reviewed: I have personally reviewed following labs and imaging studies  CBC: Recent Labs  Lab 05/05/18 0351 05/06/18 0406 05/07/18 0348  05/07/18 2219 05/08/18 0349 05/09/18 0429 05/10/18 0906  WBC 13.1* 15.0* 19.1*  --  19.8* 16.2* 13.8*  NEUTROABS 10.7*  --   --   --   --   --   --   HGB 8.0* 7.4* 7.7* 8.3* 8.1* 7.9* 7.9*  HCT 24.8* 23.3* 24.7* 25.1* 25.6* 24.9* 25.9*  MCV 91.5 92.8 92.5  --  90.1 91.2 92.2  PLT 262 254 334  --  333 341 655   Basic Metabolic Panel: Recent Labs  Lab 05/05/18 0351 05/06/18 0406 05/07/18 0348 05/08/18 0349 05/09/18 0429 05/10/18 0906  NA 142 143 141 138 136 138  K 3.6 4.0 3.9 3.6 3.3* 3.7  CL 102 102 101 99 101 99  CO2 33* 31 30  28 28 26   GLUCOSE 200* 113* 121* 121* 90 95  BUN 40* 50* 48* 49* 32* 22  CREATININE 0.88 1.14* 1.12* 1.18* 1.00 1.00  CALCIUM 7.8* 8.1* 7.9* 7.9* 7.8* 7.8*  MG 1.4* 1.8 1.5* 1.9 1.8 1.4*  PHOS 3.6 5.7* 4.7*  --  3.6  --    GFR: Estimated Creatinine Clearance: 62 mL/min (by C-G formula based on SCr of 1 mg/dL). Liver Function Tests: Recent Labs  Lab 05/05/18 0351 05/07/18 0348  AST 64* 44*  ALT 47* 42  ALKPHOS 93 101  BILITOT 0.4 0.6  PROT 4.9* 5.0*  ALBUMIN 1.7* 1.8*   No results for input(s): LIPASE, AMYLASE in the last 168 hours. No results for input(s): AMMONIA in the last 168 hours. Coagulation Profile: No results for input(s): INR, PROTIME in the last 168 hours. Cardiac Enzymes: No results for input(s): CKTOTAL, CKMB, CKMBINDEX, TROPONINI in the last 168 hours. BNP (last 3 results) No results for input(s): PROBNP in the last 8760 hours. HbA1C: No results for input(s): HGBA1C in the last 72 hours. CBG: Recent Labs  Lab 05/09/18 0758 05/09/18 1258 05/09/18 1704 05/09/18 2123 05/10/18 0802  GLUCAP 85 97 102* 81 83   Lipid Profile: No results for input(s): CHOL, HDL, LDLCALC, TRIG, CHOLHDL, LDLDIRECT in the last 72 hours. Thyroid Function Tests: No results for input(s): TSH, T4TOTAL, FREET4, T3FREE, THYROIDAB in the last 72 hours. Anemia Panel: No results for input(s): VITAMINB12, FOLATE, FERRITIN, TIBC, IRON, RETICCTPCT in the last 72 hours. Urine analysis:    Component Value Date/Time   COLORURINE YELLOW 04/05/2018 1924   APPEARANCEUR CLEAR 04/05/2018 1924   LABSPEC 1.021 04/05/2018 1924   PHURINE 5.0 04/05/2018 1924   GLUCOSEU NEGATIVE 04/05/2018 1924   HGBUR NEGATIVE 04/05/2018 1924   BILIRUBINUR NEGATIVE 04/05/2018 1924   KETONESUR NEGATIVE 04/05/2018 1924   PROTEINUR NEGATIVE 04/05/2018 1924   UROBILINOGEN 0.2 06/20/2013 1832   NITRITE NEGATIVE 04/05/2018 1924   LEUKOCYTESUR NEGATIVE 04/05/2018 1924   Sepsis  Labs: @LABRCNTIP (procalcitonin:4,lacticidven:4)  ) Recent Results (from the past 240 hour(s))  Aerobic Culture (superficial specimen)     Status: None (Preliminary result)   Collection Time: 05/07/18 10:43 AM  Result Value Ref Range Status   Specimen Description WOUND  Final   Special Requests NONE  Final   Gram Stain   Final    FEW WBC PRESENT, PREDOMINANTLY PMN FEW GRAM POSITIVE COCCI    Culture   Final    MODERATE UNIDENTIFIED ORGANISM IDENTIFICATION AND SUSCEPTIBILITIES TO FOLLOW Performed at [facility], [place] [address] [place], [place] [address]    Report Status PENDING  Incomplete     Radiology Studies: Vas [place] Upper Extremity Venous Duplex  Result Date: 05/08/2018 UPPER VENOUS  STUDY  Indications: Follow up of DVT found in one of the brachial veins 04/15/18 Performing Technologist: Sharion Dove RVS  Examination Guidelines: A complete evaluation includes B-mode imaging, spectral Doppler, color Doppler, and power Doppler as needed of all accessible portions of each vessel. Bilateral testing is considered an integral part of a complete examination. Limited examinations for reoccurring indications may be performed as noted.  Right Findings: +----------+------------+----------+---------+-----------+-----------------+ RIGHT     CompressiblePropertiesPhasicitySpontaneous     Summary      +----------+------------+----------+---------+-----------+-----------------+ IJV           Full                 Yes       Yes                      +----------+------------+----------+---------+-----------+-----------------+ Subclavian                         Yes       Yes                      +----------+------------+----------+---------+-----------+-----------------+ Axillary      None                                  Age Indeterminate +----------+------------+----------+---------+-----------+-----------------+ Brachial    Partial                                  Age Indeterminate +----------+------------+----------+---------+-----------+-----------------+ Cephalic      Full                                                    +----------+------------+----------+---------+-----------+-----------------+ Basilic       Full                                                    +----------+------------+----------+---------+-----------+-----------------+  Left Findings: +----------+------------+----------+---------+-----------+-------+ LEFT      CompressiblePropertiesPhasicitySpontaneousSummary +----------+------------+----------+---------+-----------+-------+ Subclavian                         Yes       Yes            +----------+------------+----------+---------+-----------+-------+  Summary:  Right: Findings consistent with age indeterminate deep vein thrombosis involving the right brachial veins and right axillary vein. DVT found in one of the brachial veins, 04/15/18, remains. DVT appears to have propagated in to the axillary vein.  Left: No evidence of thrombosis in the subclavian.  *See table(s) above for measurements and observations.  Diagnosing physician: Servando Snare MD Electronically signed by Servando Snare MD on 05/08/2018 at 6:00:08 PM.    Final     Scheduled Meds:  . acetaminophen  500 mg Oral Q8H  . Chlorhexidine Gluconate Cloth  6 each Topical Daily  . digoxin  0.25 mg Oral Daily  . diltiazem  60 mg Oral Q8H  . famotidine  20 mg Oral BID  . ferrous sulfate  325 mg Oral BID WC  . folic  acid  1 mg Oral Daily  . guaiFENesin  600 mg Oral BID  . insulin aspart  0-15 Units Subcutaneous TID WC  . insulin aspart  0-5 Units Subcutaneous QHS  . latanoprost  1 drop Both Eyes QHS  . mouth rinse  15 mL Mouth Rinse BID  . methocarbamol  1,000 mg Oral QID  . nystatin   Topical TID  . pregabalin  75 mg Oral QHS  . timolol  1 drop Both Eyes BID   Continuous Infusions: . sodium chloride Stopped (05/03/18 0616)  . fluconazole  (DIFLUCAN) IV Stopped (05/09/18 1258)  . heparin 1,250 Units/hr (05/10/18 1130)  . magnesium sulfate 1 - 4 g bolus IVPB 50 mL/hr at 05/07/18 1057  . magnesium sulfate 1 - 4 g bolus IVPB    . piperacillin-tazobactam (ZOSYN)  IV 12.5 mL/hr at 05/10/18 0600   Anti-infectives (From admission, onward)   Start     Dose/Rate Route Frequency Ordered Stop   05/08/18 0815  piperacillin-tazobactam (ZOSYN) IVPB 3.375 g     3.375 g 12.5 mL/hr over 240 Minutes Intravenous Every 8 hours 05/08/18 0801     05/05/18 1245  meropenem (MERREM) 1 g in sodium chloride 0.9 % 100 mL IVPB  Status:  Discontinued     1 g 200 mL/hr over 30 Minutes Intravenous Every 8 hours 05/05/18 1245 05/06/18 1006   05/01/18 1600  meropenem (MERREM) 1 g in sodium chloride 0.9 % 100 mL IVPB  Status:  Discontinued     1 g 200 mL/hr over 30 Minutes Intravenous Every 8 hours 05/01/18 1539 05/05/18 1243   04/30/18 1215  fluconazole (DIFLUCAN) IVPB 400 mg  Status:  Discontinued     400 mg 100 mL/hr over 120 Minutes Intravenous To Surgery 04/30/18 1200 05/01/18 1215   04/29/18 1100  fluconazole (DIFLUCAN) IVPB 400 mg     400 mg 100 mL/hr over 120 Minutes Intravenous Every 24 hours 04/29/18 1006 05/27/18 0959   04/27/18 1500  meropenem (MERREM) 1 g in sodium chloride 0.9 % 100 mL IVPB  Status:  Discontinued     1 g 200 mL/hr over 30 Minutes Intravenous Every 12 hours 04/27/18 1008 05/01/18 1539   04/26/18 1300  metroNIDAZOLE (FLAGYL) IVPB 500 mg  Status:  Discontinued     500 mg 100 mL/hr over 60 Minutes Intravenous Every 6 hours 04/26/18 1257 04/27/18 1041   04/15/18 1700  meropenem (MERREM) 1 g in sodium chloride 0.9 % 100 mL IVPB  Status:  Discontinued     1 g 200 mL/hr over 30 Minutes Intravenous Every 8 hours 04/15/18 1534 04/27/18 1008   04/09/18 2200  meropenem (MERREM) 1 g in sodium chloride 0.9 % 100 mL IVPB  Status:  Discontinued     1 g 200 mL/hr over 30 Minutes Intravenous Every 8 hours 04/09/18 1244 04/14/18 0944    04/06/18 1300  meropenem (MERREM) 1 g in sodium chloride 0.9 % 100 mL IVPB  Status:  Discontinued     1 g 200 mL/hr over 30 Minutes Intravenous Every 12 hours 04/06/18 1145 04/09/18 1244   04/06/18 1145  meropenem (MERREM) 1 g in sodium chloride 0.9 % 100 mL IVPB  Status:  Discontinued     1 g 200 mL/hr over 30 Minutes Intravenous Every 8 hours 04/06/18 1139 04/06/18 1145   04/06/18 0900  ciprofloxacin (CIPRO) IVPB 400 mg  Status:  Discontinued     400 mg 200 mL/hr over 60 Minutes Intravenous Every 12 hours 04/06/18  0732 04/06/18 1139   04/06/18 0300  metroNIDAZOLE (FLAGYL) IVPB 500 mg  Status:  Discontinued     500 mg 100 mL/hr over 60 Minutes Intravenous Every 8 hours 04/05/18 2335 04/06/18 1139   04/05/18 1930  ciprofloxacin (CIPRO) IVPB 400 mg     400 mg 200 mL/hr over 60 Minutes Intravenous  Once 04/05/18 1925 04/05/18 2217   04/05/18 1930  metroNIDAZOLE (FLAGYL) IVPB 500 mg     500 mg 100 mL/hr over 60 Minutes Intravenous  Once 04/05/18 1925 04/05/18 2102      LOS: 33 days   Signature  Lala Lund M.D on 05/10/2018 at 11:50 AM   -  To page go to [email or web address] - password [facility]

## 2018-05-10 NOTE — Progress Notes (Deleted)
ANTICOAGULATION CONSULT NOTE - Follow Up Consult  Pharmacy Consult for heparin Indication: DVT in setting of blood-loss anemia   Labs: Recent Labs    05/08/18 0349 05/09/18 0429 05/09/18 2223 05/10/18 0906  HGB 8.1* 7.9*  --  7.9*  HCT 25.6* 24.9*  --  25.9*  PLT 333 341  --  322  HEPARINUNFRC  --   --  0.59 0.54  CREATININE 1.18* 1.00  --  1.00    Assessment: 72yo female supratherapeutic on heparin with initial dosing after resuming heparin for DVT; no gtt issues or signs of bleeding.   HL came back still slightly above goal this AM. We will reduce rate.   Goal of Therapy:  Heparin level 0.3-0.5 units/ml   Plan:   Decrease heparin to 1250 units/hr  F/u with 6 hr HL  Onnie Boer, PharmD, [place], AAHIVP, CPP Infectious Disease Pharmacist 05/10/2018 11:18 AM

## 2018-05-11 DIAGNOSIS — N183 Chronic kidney disease, stage 3 (moderate): Secondary | ICD-10-CM

## 2018-05-11 DIAGNOSIS — K5792 Diverticulitis of intestine, part unspecified, without perforation or abscess without bleeding: Secondary | ICD-10-CM

## 2018-05-11 DIAGNOSIS — L0291 Cutaneous abscess, unspecified: Secondary | ICD-10-CM

## 2018-05-11 DIAGNOSIS — N179 Acute kidney failure, unspecified: Secondary | ICD-10-CM

## 2018-05-11 DIAGNOSIS — K5720 Diverticulitis of large intestine with perforation and abscess without bleeding: Secondary | ICD-10-CM

## 2018-05-11 LAB — CBC
HCT: 24.1 % — ABNORMAL LOW (ref 36.0–46.0)
Hemoglobin: 7.8 g/dL — ABNORMAL LOW (ref 12.0–15.0)
MCH: 29.8 pg (ref 26.0–34.0)
MCHC: 32.4 g/dL (ref 30.0–36.0)
MCV: 92 fL (ref 80.0–100.0)
Platelets: 313 10*3/uL (ref 150–400)
RBC: 2.62 MIL/uL — ABNORMAL LOW (ref 3.87–5.11)
RDW: 18.2 % — ABNORMAL HIGH (ref 11.5–15.5)
WBC: 11.7 10*3/uL — ABNORMAL HIGH (ref 4.0–10.5)
nRBC: 1.4 % — ABNORMAL HIGH (ref 0.0–0.2)

## 2018-05-11 LAB — BASIC METABOLIC PANEL
Anion gap: 11 (ref 5–15)
BUN: 23 mg/dL (ref 8–23)
CO2: 26 mmol/L (ref 22–32)
Calcium: 7.8 mg/dL — ABNORMAL LOW (ref 8.9–10.3)
Chloride: 99 mmol/L (ref 98–111)
Creatinine, Ser: 0.93 mg/dL (ref 0.44–1.00)
GFR calc Af Amer: >60 mL/min (ref >60)
Glucose, Bld: 88 mg/dL (ref 70–99)
Potassium: 3.6 mmol/L (ref 3.5–5.1)
SODIUM: 136 mmol/L (ref 135–145)

## 2018-05-11 LAB — HEPARIN LEVEL (UNFRACTIONATED)
HEPARIN UNFRACTIONATED: 0.38 [IU]/mL (ref 0.30–0.70)
Heparin Unfractionated: 0.43 IU/mL (ref 0.30–0.70)

## 2018-05-11 LAB — GLUCOSE, CAPILLARY
Glucose-Capillary: 84 mg/dL (ref 70–99)
Glucose-Capillary: 106 mg/dL — ABNORMAL HIGH (ref 70–99)
Glucose-Capillary: 148 mg/dL — ABNORMAL HIGH (ref 70–99)
Glucose-Capillary: 93 mg/dL (ref 70–99)

## 2018-05-11 LAB — MAGNESIUM: Magnesium: 2.1 mg/dL (ref 1.7–2.4)

## 2018-05-11 MED ORDER — VANCOMYCIN HCL 10 G IV SOLR
1500.0000 mg | Freq: Once | INTRAVENOUS | Status: AC
  Administered 2018-05-11: 1500 mg via INTRAVENOUS
  Filled 2018-05-11: qty 1500

## 2018-05-11 MED ORDER — FLUCONAZOLE 100 MG PO TABS
400.0000 mg | ORAL_TABLET | Freq: Every day | ORAL | Status: DC
  Administered 2018-05-12 – 2018-05-13 (×2): 400 mg via ORAL
  Filled 2018-05-11 (×3): qty 4

## 2018-05-11 MED ORDER — VANCOMYCIN HCL 10 G IV SOLR
1250.0000 mg | Freq: Two times a day (BID) | INTRAVENOUS | Status: DC
  Administered 2018-05-12 – 2018-05-13 (×2): 1250 mg via INTRAVENOUS
  Filled 2018-05-11 (×6): qty 1250

## 2018-05-11 NOTE — Plan of Care (Signed)
  Problem: Education: Goal: Knowledge of General Education information will improve Description: Including pain rating scale, medication(s)/side effects and non-pharmacologic comfort measures Outcome: Progressing   Problem: Safety: Goal: Ability to remain free from injury will improve Outcome: Progressing   

## 2018-05-11 NOTE — Progress Notes (Signed)
[place] for heparin Indication: acute DVT  Allergies  Allergen Reactions  . Lactose Intolerance (Gi) Other (See Comments)    G.I. Upset  . Butrans [Buprenorphine] Rash and Other (See Comments)    Infected skin underneath application  . Penicillins Rash    Facial rash Has patient had a PCN reaction causing immediate rash, facial/tongue/throat swelling, SOB or lightheadedness with hypotension: Yes Has patient had a PCN reaction causing severe rash involving mucus membranes or skin necrosis: No Has patient had a PCN reaction that required hospitalization No Has patient had a PCN reaction occurring within the last 10 years: Yes If all of the above answers are "NO", then may proceed with Cephalosporin use.   . Simvastatin Rash    Patient Measurements: Height: 5' 5.5" (166.4 cm) Weight: 239 lb 10.2 oz (108.7 kg) IBW/kg (Calculated) : 58.15 Heparin Dosing Weight: 85 kg  Vital Signs: Temp: 98.1 F (36.7 C) (11/30 2143) Temp Source: Oral (11/30 2143) BP: 127/62 (11/30 2143) Pulse Rate: 96 (11/30 2143)  Labs: Recent Labs    05/09/18 0429  05/10/18 0906 05/10/18 2043 05/11/18 0321  HGB 7.9*  --  7.9*  --  7.8*  HCT 24.9*  --  25.9*  --  24.1*  PLT 341  --  322  --  313  HEPARINUNFRC  --    < > 0.54 0.52 0.38  CREATININE 1.00  --  1.00  --  0.93   < > = values in this interval not displayed.    Estimated Creatinine Clearance: 66.7 mL/min (by C-G formula based on SCr of 0.93 mg/dL).   Medical History: Past Medical History:  Diagnosis Date  . Anginal pain [facility])    sees Dr Einar Gip.   . Arthritis    rheumatoid ..   . Diabetes mellitus without complication (HCC)    Metformin 2.3.2017  . Diverticulitis   . Diverticulitis of large intestine with perforation and abscess 04/05/2018  . Dysrhythmia   . Fibromyalgia   . GERD (gastroesophageal reflux disease)   . High cholesterol   . Hypertension   . Liver disease 08- 2012   per Dr  Dagmar Hait pt has enlarged liver  . Peripheral vascular disease (HCC)    legs  . Pneumonia 06/17/2016  . Sleep apnea    sleep study  oct 2012  . Stroke [facility]) 2011   [place]      Medications:  Medications Prior to Admission  Medication Sig Dispense Refill Last Dose  . Blood Glucose Monitoring Suppl (FREESTYLE LITE) DEVI See admin instructions.  0 04/05/2018 at Unknown time  . ciprofloxacin (CIPRO) 500 MG/5ML (10%) suspension Take by mouth 2 (two) times daily.   04/05/2018 at Unknown time  . clopidogrel (PLAVIX) 75 MG tablet Take 1 tablet (75 mg total) by mouth daily with breakfast. 90 tablet 6 04/05/2018 at 0800  . Dexlansoprazole 30 MG capsule Take 40 mg by mouth daily.   04/05/2018 at Unknown time  . diclofenac sodium (VOLTAREN) 1 % GEL Apply 2 g topically 4 (four) times daily as needed (Pain).    04/05/2018 at Unknown time  . glipiZIDE (GLUCOTROL) 10 MG tablet Take 10 mg by mouth daily before breakfast.   04/05/2018 at Unknown time  . HYDROcodone-acetaminophen (NORCO) 10-325 MG tablet Take 1 tablet by mouth 4 (four) times daily.   04/05/2018 at Unknown time  . IRON PO Take 1 tablet by mouth daily.   04/05/2018 at Unknown time  . isosorbide dinitrate (  ISORDIL) 5 MG tablet Take 1 tablet (5 mg total) by mouth 3 (three) times daily before meals. 90 tablet 1 04/05/2018 at Unknown time  . leflunomide (ARAVA) 20 MG tablet Take 20 mg by mouth daily.   04/05/2018 at Unknown time  . metoprolol succinate (TOPROL-XL) 25 MG 24 hr tablet Take 25 mg by mouth daily.   04/05/2018 at 0800  . metroNIDAZOLE (FLAGYL) 250 MG tablet Take 250 mg by mouth 3 (three) times daily.   04/05/2018 at Unknown time  . Olmesartan-amLODIPine-HCTZ (TRIBENZOR) 40-5-25 MG TABS Take 1 tablet by mouth daily.    04/05/2018 at Unknown time  . pravastatin (PRAVACHOL) 40 MG tablet Take 40 mg by mouth at bedtime.   04/05/2018 at Unknown time  . predniSONE (DELTASONE) 5 MG tablet Take 5 mg by mouth daily with breakfast.    04/05/2018 at Unknown time  . pregabalin (LYRICA) 75 MG capsule Take 75 mg by mouth 3 (three) times daily.   04/05/2018 at Unknown time  . Tafluprost (ZIOPTAN) 0.0015 % SOLN Place 1 drop into both eyes at bedtime.    04/05/2018 at Unknown time  . temazepam (RESTORIL) 30 MG capsule Take 30 mg by mouth at bedtime.   04/05/2018 at Unknown time  . timolol (BETIMOL) 0.5 % ophthalmic solution Place 1 drop into both eyes 2 (two) times daily.    04/05/2018 at Unknown time  . Tofacitinib Citrate (XELJANZ) 5 MG TABS Take 1 tablet by mouth daily.   04/05/2018 at Unknown time  . VITAMIN D, ERGOCALCIFEROL, PO Take 50,000 Units by mouth once a week. Thursdays   04/05/2018 at Unknown time  . docusate sodium (COLACE) 100 MG capsule Take 2 capsules (200 mg total) by mouth at bedtime. (Patient taking differently: Take 200 mg by mouth as needed. 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 RUE duplex was rechecked which showed DVT remains and propagated. Pharmacy consulted to resume IV heparin. Will keep heparin level on low end of normal and monitor closely -heparin level= 0.38 this am   Goal of Therapy:  Heparin level 0.3-0.5 units/ml  Monitor platelets by anticoagulation protocol: Yes   Plan:   -Continue heparin at 1150 units/hr Daily HL and CBC  Thanks for allowing pharmacy to be a part of this patient's care.  Excell Seltzer, PharmD Clinical Pharmacist

## 2018-05-11 NOTE — Progress Notes (Signed)
[facility] Progress Note  11 Days Post-Op  Subjective: CC:  Reports feeling better today. Tolerating PO. Getting OOB to chair. Denies nausea or vomiting. Her husband is at bedside.  Objective: Vital signs in last 24 hours: Temp:  [97.8 F (36.6 C)-98.5 F (36.9 C)] 97.8 F (36.6 C) (12/01 0556) Pulse Rate:  [86-99] 86 (12/01 0556) Resp:  [18] 18 (12/01 0556) BP: (127-158)/(62-114) 134/69 (12/01 0556) SpO2:  [93 %-97 %] 95 % (12/01 0556) Last BM Date: 05/11/18  Intake/Output from previous day: 11/30 0701 - 12/01 0700 In: 2299.6 [P.O.:1090; I.V.:265.6; IV Piggyback:944] Out: 2140 [Urine:1200; Drains:190; Stool:750] Intake/Output this shift: Total I/O In: 149.3 [I.V.:65.5; Other:50; IV Piggyback:33.8] Out: -   PE: Gen:  Alert, NAD, pleasant Pulm:  Normal effort Abd: Soft, approp tender, stool in ileostomy pouch             LUQ - 190 cc/24h, feculent              LLQ - 5 cc/25h SS             R IR drain  Skin: warm and dry, no rashes  Psych: A&Ox3  Lab Results:  Recent Labs    05/10/18 0906 05/11/18 0321  WBC 13.8* 11.7*  HGB 7.9* 7.8*  HCT 25.9* 24.1*  PLT 322 313   BMET Recent Labs    05/10/18 0906 05/11/18 0321  NA 138 136  K 3.7 3.6  CL 99 99  CO2 26 26  GLUCOSE 95 88  BUN 22 23  CREATININE 1.00 0.93  CALCIUM 7.8* 7.8*   PT/INR No results for input(s): LABPROT, INR in the last 72 hours. CMP     Component Value Date/Time   NA 136 05/11/2018 0321   K 3.6 05/11/2018 0321   CL 99 05/11/2018 0321   CO2 26 05/11/2018 0321   GLUCOSE 88 05/11/2018 0321   BUN 23 05/11/2018 0321   CREATININE 0.93 05/11/2018 0321   CALCIUM 7.8 (L) 05/11/2018 0321   PROT 5.0 (L) 05/07/2018 0348   ALBUMIN 1.8 (L) 05/07/2018 0348   AST 44 (H) 05/07/2018 0348   ALT 42 05/07/2018 0348   ALKPHOS 101 05/07/2018 0348   BILITOT 0.6 05/07/2018 0348   GFRNONAA >60 05/11/2018 0321   GFRAA >60 05/11/2018 0321   Lipase  No results found for:  LIPASE     Studies/Results: No results found.  Anti-infectives: Anti-infectives (From admission, onward)   Start     Dose/Rate Route Frequency Ordered Stop   05/08/18 0815  piperacillin-tazobactam (ZOSYN) IVPB 3.375 g     3.375 g 12.5 mL/hr over 240 Minutes Intravenous Every 8 hours 05/08/18 0801     05/05/18 1245  meropenem (MERREM) 1 g in sodium chloride 0.9 % 100 mL IVPB  Status:  Discontinued     1 g 200 mL/hr over 30 Minutes Intravenous Every 8 hours 05/05/18 1245 05/06/18 1006   05/01/18 1600  meropenem (MERREM) 1 g in sodium chloride 0.9 % 100 mL IVPB  Status:  Discontinued     1 g 200 mL/hr over 30 Minutes Intravenous Every 8 hours 05/01/18 1539 05/05/18 1243   04/30/18 1215  fluconazole (DIFLUCAN) IVPB 400 mg  Status:  Discontinued     400 mg 100 mL/hr over 120 Minutes Intravenous To Surgery 04/30/18 1200 05/01/18 1215   04/29/18 1100  fluconazole (DIFLUCAN) IVPB 400 mg     400 mg 100 mL/hr over 120 Minutes Intravenous Every 24 hours 04/29/18 1006 05/27/18  2426   04/27/18 1500  meropenem (MERREM) 1 g in sodium chloride 0.9 % 100 mL IVPB  Status:  Discontinued     1 g 200 mL/hr over 30 Minutes Intravenous Every 12 hours 04/27/18 1008 05/01/18 1539   04/26/18 1300  metroNIDAZOLE (FLAGYL) IVPB 500 mg  Status:  Discontinued     500 mg 100 mL/hr over 60 Minutes Intravenous Every 6 hours 04/26/18 1257 04/27/18 1041   04/15/18 1700  meropenem (MERREM) 1 g in sodium chloride 0.9 % 100 mL IVPB  Status:  Discontinued     1 g 200 mL/hr over 30 Minutes Intravenous Every 8 hours 04/15/18 1534 04/27/18 1008   04/09/18 2200  meropenem (MERREM) 1 g in sodium chloride 0.9 % 100 mL IVPB  Status:  Discontinued     1 g 200 mL/hr over 30 Minutes Intravenous Every 8 hours 04/09/18 1244 04/14/18 0944   04/06/18 1300  meropenem (MERREM) 1 g in sodium chloride 0.9 % 100 mL IVPB  Status:  Discontinued     1 g 200 mL/hr over 30 Minutes Intravenous Every 12 hours 04/06/18 1145 04/09/18 1244    04/06/18 1145  meropenem (MERREM) 1 g in sodium chloride 0.9 % 100 mL IVPB  Status:  Discontinued     1 g 200 mL/hr over 30 Minutes Intravenous Every 8 hours 04/06/18 1139 04/06/18 1145   04/06/18 0900  ciprofloxacin (CIPRO) IVPB 400 mg  Status:  Discontinued     400 mg 200 mL/hr over 60 Minutes Intravenous Every 12 hours 04/06/18 0732 04/06/18 1139   04/06/18 0300  metroNIDAZOLE (FLAGYL) IVPB 500 mg  Status:  Discontinued     500 mg 100 mL/hr over 60 Minutes Intravenous Every 8 hours 04/05/18 2335 04/06/18 1139   04/05/18 1930  ciprofloxacin (CIPRO) IVPB 400 mg     400 mg 200 mL/hr over 60 Minutes Intravenous  Once 04/05/18 1925 04/05/18 2217   04/05/18 1930  metroNIDAZOLE (FLAGYL) IVPB 500 mg     500 mg 100 mL/hr over 60 Minutes Intravenous  Once 04/05/18 1925 04/05/18 2102       Assessment/Plan HLD 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 - on plavix (last dose 10/27) H/o CVA  Morbidly obese Chronic pain RUE DVT -was onIV heparin, now treatment dose lovenox- lovenox held 11/26 due to below Acute on chronic anemia - hgb improved today 7.7, follow  Acute perforated sigmoid diverticulitis with multiple abscesses -s/pIR perc drains x 2 - 04/16/18, culture with multiple organisms  -S/p3rdIR drain 11/13, culturegrowingFEW CANDIDA TROPICALIS  S/p Exploratory laparotomy, extensive lysis of adhesions, small bowel resection, loop ileostomy, drainage of multiple intra-abdominal abscesses, placement of wound VAC11/20/19 Dr. Georgette Dover -path negativefor malignancy - afebrile, WBC trendingdown(11.7today) - midline vac MWF - CT11/27showed 3 new fluid collections that IR is not able to drain - single IR drain remains on the R, and 2 JP drains placed intraoperatively ; LUQ surgical drain now with feculent drainage, this drain terminates in the pelvis and likely represents a persistent perforation of the colon. - ID involved and recommends Zosyn and fluconazole, I agree with the  recommendations since IR is unable to drain intra-abdominal fluid collections seen on CT11/27   ID -merrem 10/27-11/26, flagyl 11/16>>11/17;Zosyn 11/28>> FEN -SOFT diet, start to wean TNA. Prealbumin19.4 , ensure Foley -external cath  Plan-POpain control,abxper ID. Encourage mobilization, likely discharge to SNF this week. I discussed the patients surgical care with her and her husband in detail, including follow up with Dr. Georgette Dover and  likely follow up with a colorectal specialist in our office for continued management of complicated diverticulitis with perforation and controlled leak.    LOS: 34 days    Obie Dredge, [facility] Pager: [phone]

## 2018-05-11 NOTE — Progress Notes (Signed)
PROGRESS NOTE    [name]  EXB:[id number] DOB: 1945/10/14 DOA: 04/05/2018 PCP: [name], MD    Brief Narrative:   72 year old female with past medical history of hypertension, type 2 diabetes mellitus chronic kidney disease rheumatoid arthritis coronary artery disease S/P CABG on Plavix who was admitted with abdominal pain and found to have sigmoid diverticulitis on April 05, 2018 with perforation and abscess.  Initially was felt to be improving on IV Merrem but she continued to have abdominal pain. CT scan of the abdomen and pelvis was obtained April 15, 2018 and was significant for worsening diverticulitis with abdominal fluid collections, she was transferred to [facility] for IR evaluation, he did receive total of 3 percutaneous drains, with repeat CT abdomen pelvis 04/27/2018, still showing remaining fluid collection/abscess despite he strains, and appropriate antibiotic coverage, patient went for exploratory laparotomy with lysis of adhesion, small bowel resection with loop ileostomy, and placement of wound VAC by Dr. Georgette Dover 04/30/2018.  SUBJECTIVE:  Patient in bed, appears comfortable, denies any headache, no fever, no chest pain or pressure, no shortness of breath , no abdominal pain. No focal weakness.  Assessment & Plan:    Perforated sigmoid diverticulitis/abdominal abscess : General surgery following, initially managed conservatively with multiple abdominal drain placements however repeat CT scan on 04/27/2018 showed worsening of abscess and disease process after which she was taken to the OR for laparotomy with lysis of adhesion, small bowel resection, loop ileostomy and placement of wound VAC by Dr. Georgette Dover on 04/30/2018.  Abscess cultures noted which are growing Candida, Clostridium along with Peptostreptococcus and now ampicillin resistant Enterococcus.  She had already finished 4 weeks of meropenem on 05/06/2018.  At this time ID and general surgery will following,  repeat CT scan on 05/07/2018 shows multiple new collections of abscess in the abdomen, defer management to ID and general surgery apparently IR unable to reach these new collections, again will defer to general surgery on management of these fluid collections/abscess, she has currently been started on Zosyn per ID on 05/08/2018, IV Diflucan being continued with start date of 04/29/2018.  Wound culture is now growing ampicillin resistant enterococcus as well we will start IV Vancomycin on 05/11/2018.  Per general surgery 1 of her drains is draining feculent material.  However no surgical intervention at this time per surgery.  Will defer management to surgery.    Acute right upper extremity DVT extremity  -  due to PICC line this was just discontinued April 16, 2018 was on full dose Lovenox for anticoagulation as well however her H&H dropped and her anticoagulation had to be held on 05/06/2018, repeated right upper extremity ultrasound on 05/08/2018 which shows her DVTs extending hence had to resume heparin drip per pharmacy on 05/09/18 continue to monitor.  Anemia of critical illness.  Some element of dilution but her ileostomy output appears dark, despite holding PNA and diuresing her with Lasix her H&H is going down suggesting some ongoing blood loss, continue IV PPI, anemia panel was inconclusive she received 1 unit of packed RBC on 05/07/2018 with stabilization of H&H.  Will reinitiate heparin drip with caution and continue to monitor H&H.  Acute kidney injury with chronic kidney disease stage III  - recent creatinine of 1.2, ARF resolved currently close to baseline.  Fibromyalgia - continue Lyrica able to take oral  Hypertension improved.  - Blood pressure stable on low-dose Cardizem.  Advanced weakness and deconditioning -   PT has been consulted they recommend  SNF.  Patient encouraged to sit up in the chair rather than laying in bed.  Counseled patient and husband multiple times including  05/07/2018, 05/08/2018.  Acute illness required anemia on chronic anemia  - she required total of 4 units PRBC transfusion during hospital stay, monitor closely and transfuse as needed .  Depression patient recently started on Lexapro  Paroxysmal atrial tachycardia.  She has not had any more episodes of PAT since that of April 22, 2018 echocardiogram that was done during showed preserved ejection fraction she has stable TSH, on low-dose Cardizem will adjust as blood pressure permits, due to borderline soft pressures she had to be placed on digoxin which she has tolerated well with good rate control now.  Hypomagnesemia - replaced IV, recheck in am.  Type 2 diabetes mellitus controlled  - Continue with insulin sliding scale every 4 hours as she is on TPN, CBG acceptable .  CBG (last 3)  Recent Labs    05/10/18 1724 05/10/18 2141 05/11/18 0742  GLUCAP 89 99 84       DVT prophylaxis: Subcutaneous Lovenox treatment dose for right upper extremity DVT  code Status: Full Family Communication: family at bedside  05/04/18 Disposition Plan: Physical therapy recommended SNF  Consultants:   General surgery  IR  ID - phone Dr Johnnye Sima 05/05/18  Procedures:  - Echocardiogram -   Left ventricle: The cavity size was normal. Wall thickness was increased in a pattern of moderate LVH. Systolic function was  vigorous. The estimated ejection fraction was in the range of 65% to 70%. Indeterminant diastolic function. Wall motion was normal; there were no regional wall motion abnormalities. - Aortic valve: Valve area (VTI): 2.74 cm^2. Valve area (Vmax):   2.58 cm^2. Valve area (Vmean): 2.56 cm^2.  -S/PIR perc drains x 2 - 04/16/18, culture with multiple organisms present/none predominant -S/P3rdIR drain 11/13, culturegrowingFEW CANDIDA TROPICALIS, - Right arm PICC line - had DVT and was remioved -  Left arm PICC line  - Exploratory laparotomy with lysis of adhesion, small bowel  resection with loop ileostomy, and placement of wound VAC by Dr. Georgette Dover 04/30/2018.  - CT 11/27 - 1. 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 Diverticular changes throughout decompressed bowel without definite focal inflammation. There is some wall thickening in the sigmoid colon. 2. Existing drainage catheters are not associated with any significant residual fluid collections. 3. Three separate fluid collections are increasing, as described. 4.  Aortic Atherosclerosis (ICD10-I70.0). 5. Bibasilar airspace disease is likely atelectasis.    Objective:  Vitals:   05/10/18 0427 05/10/18 1343 05/10/18 2143 05/11/18 0556  BP: 124/81 (!) 158/114 127/62 134/69  Pulse: 98 99 96 86  Resp: 18  18 18   Temp: 98.1 F (36.7 C) 98.5 F (36.9 C) 98.1 F (36.7 C) 97.8 F (36.6 C)  TempSrc: Oral Oral Oral   SpO2: 94% 97% 93% 95%  Weight:      Height:        Intake/Output Summary (Last 24 hours) at 05/11/2018 0951 Last data filed at 05/11/2018 0900 Gross per 24 hour  Intake 2318.86 ml  Output 2140 ml  Net 178.86 ml   Filed Weights   05/09/18 0500 05/09/18 1100 05/10/18 0314  Weight: 113.9 kg 113.9 kg 108.7 kg    Examination:  Awake Alert, Oriented X 3, No new F.N deficits, Normal affect [place].AT,PERRAL Supple Neck,No JVD, No cervical lymphadenopathy appriciated.  Symmetrical Chest wall movement, Good air movement bilaterally, CTAB RRR,No Gallops, Rubs or new  Murmurs, No Parasternal Heave +ve B.Sounds, midline surgical wound VAC, right ileostomyileostomy bag, left JP drain with sanguinous material . No Cyanosis, Clubbing or edema, No new Rash or bruise     Data Reviewed: I have personally reviewed following labs and imaging studies  CBC: Recent Labs  Lab 05/05/18 0351  05/07/18 0348 05/07/18 2219 05/08/18 0349 05/09/18 0429 05/10/18 0906 05/11/18 0321  WBC 13.1*   < > 19.1*  --  19.8* 16.2* 13.8* 11.7*  NEUTROABS 10.7*  --   --   --   --   --   --   --   HGB  8.0*   < > 7.7* 8.3* 8.1* 7.9* 7.9* 7.8*  HCT 24.8*   < > 24.7* 25.1* 25.6* 24.9* 25.9* 24.1*  MCV 91.5   < > 92.5  --  90.1 91.2 92.2 92.0  PLT 262   < > 334  --  333 341 322 313   < > = values in this interval not displayed.   Basic Metabolic Panel: Recent Labs  Lab 05/05/18 0351 05/06/18 0406 05/07/18 0348 05/08/18 0349 05/09/18 0429 05/10/18 0906 05/11/18 0321  NA 142 143 141 138 136 138 136  K 3.6 4.0 3.9 3.6 3.3* 3.7 3.6  CL 102 102 101 99 101 99 99  CO2 33* 31 30 28 28 26 26   GLUCOSE 200* 113* 121* 121* 90 95 88  BUN 40* 50* 48* 49* 32* 22 23  CREATININE 0.88 1.14* 1.12* 1.18* 1.00 1.00 0.93  CALCIUM 7.8* 8.1* 7.9* 7.9* 7.8* 7.8* 7.8*  MG 1.4* 1.8 1.5* 1.9 1.8 1.4* 2.1  PHOS 3.6 5.7* 4.7*  --  3.6  --   --    GFR: Estimated Creatinine Clearance: 66.7 mL/min (by C-G formula based on SCr of 0.93 mg/dL). Liver Function Tests: Recent Labs  Lab 05/05/18 0351 05/07/18 0348  AST 64* 44*  ALT 47* 42  ALKPHOS 93 101  BILITOT 0.4 0.6  PROT 4.9* 5.0*  ALBUMIN 1.7* 1.8*   No results for input(s): LIPASE, AMYLASE in the last 168 hours. No results for input(s): AMMONIA in the last 168 hours. Coagulation Profile: No results for input(s): INR, PROTIME in the last 168 hours. Cardiac Enzymes: No results for input(s): CKTOTAL, CKMB, CKMBINDEX, TROPONINI in the last 168 hours. BNP (last 3 results) No results for input(s): PROBNP in the last 8760 hours. HbA1C: No results for input(s): HGBA1C in the last 72 hours. CBG: Recent Labs  Lab 05/10/18 0802 05/10/18 1221 05/10/18 1724 05/10/18 2141 05/11/18 0742  GLUCAP 83 108* 89 99 84   Lipid Profile: No results for input(s): CHOL, HDL, LDLCALC, TRIG, CHOLHDL, LDLDIRECT in the last 72 hours. Thyroid Function Tests: No results for input(s): TSH, T4TOTAL, FREET4, T3FREE, THYROIDAB in the last 72 hours. Anemia Panel: No results for input(s): VITAMINB12, FOLATE, FERRITIN, TIBC, IRON, RETICCTPCT in the last 72 hours. Urine  analysis:    Component Value Date/Time   COLORURINE YELLOW 04/05/2018 1924   APPEARANCEUR CLEAR 04/05/2018 1924   LABSPEC 1.021 04/05/2018 1924   PHURINE 5.0 04/05/2018 1924   GLUCOSEU NEGATIVE 04/05/2018 1924   HGBUR NEGATIVE 04/05/2018 1924   BILIRUBINUR NEGATIVE 04/05/2018 [place] NEGATIVE 04/05/2018 1924   PROTEINUR NEGATIVE 04/05/2018 1924   UROBILINOGEN 0.2 06/20/2013 1832   NITRITE NEGATIVE 04/05/2018 1924   LEUKOCYTESUR NEGATIVE 04/05/2018 1924   Sepsis Labs: @LABRCNTIP (procalcitonin:4,lacticidven:4)  ) Recent Results (from the past 240 hour(s))  Aerobic Culture (superficial specimen)     Status:  None   Collection Time: 05/07/18 10:43 AM  Result Value Ref Range Status   Specimen Description WOUND  Final   Special Requests NONE  Final   Gram Stain   Final    FEW WBC PRESENT, PREDOMINANTLY PMN FEW GRAM POSITIVE COCCI Performed at [facility], [place] [address], [place], [place] [address]    Culture MODERATE ENTEROCOCCUS RAFFINOSUS  Final   Report Status 05/10/2018 FINAL  Final   Organism ID, Bacteria ENTEROCOCCUS RAFFINOSUS  Final      Susceptibility   Enterococcus raffinosus - MIC*    AMPICILLIN 16 RESISTANT Resistant     VANCOMYCIN <=0.5 SENSITIVE Sensitive     GENTAMICIN SYNERGY RESISTANT Resistant     * MODERATE ENTEROCOCCUS RAFFINOSUS     Radiology Studies: No results found.  Scheduled Meds:  . acetaminophen  500 mg Oral Q8H  . Chlorhexidine Gluconate Cloth  6 each Topical Daily  . digoxin  0.25 mg Oral Daily  . diltiazem  60 mg Oral Q8H  . famotidine  20 mg Oral BID  . ferrous sulfate  325 mg Oral BID WC  . folic acid  1 mg Oral Daily  . guaiFENesin  600 mg Oral BID  . insulin aspart  0-15 Units Subcutaneous TID WC  . insulin aspart  0-5 Units Subcutaneous QHS  . latanoprost  1 drop Both Eyes QHS  . mouth rinse  15 mL Mouth Rinse BID  . methocarbamol  1,000 mg Oral QID  . nystatin   Topical TID  . pregabalin  75 mg Oral QHS  .  timolol  1 drop Both Eyes BID   Continuous Infusions: . sodium chloride 500 mL (05/11/18 0204)  . fluconazole (DIFLUCAN) IV Stopped (05/10/18 1403)  . heparin 1,150 Units/hr (05/11/18 0900)  . magnesium sulfate 1 - 4 g bolus IVPB 50 mL/hr at 05/07/18 1057  . piperacillin-tazobactam (ZOSYN)  IV 12.5 mL/hr at 05/11/18 0900   Anti-infectives (From admission, onward)   Start     Dose/Rate Route Frequency Ordered Stop   05/08/18 0815  piperacillin-tazobactam (ZOSYN) IVPB 3.375 g     3.375 g 12.5 mL/hr over 240 Minutes Intravenous Every 8 hours 05/08/18 0801     05/05/18 1245  meropenem (MERREM) 1 g in sodium chloride 0.9 % 100 mL IVPB  Status:  Discontinued     1 g 200 mL/hr over 30 Minutes Intravenous Every 8 hours 05/05/18 1245 05/06/18 1006   05/01/18 1600  meropenem (MERREM) 1 g in sodium chloride 0.9 % 100 mL IVPB  Status:  Discontinued     1 g 200 mL/hr over 30 Minutes Intravenous Every 8 hours 05/01/18 1539 05/05/18 1243   04/30/18 1215  fluconazole (DIFLUCAN) IVPB 400 mg  Status:  Discontinued     400 mg 100 mL/hr over 120 Minutes Intravenous To Surgery 04/30/18 1200 05/01/18 1215   04/29/18 1100  fluconazole (DIFLUCAN) IVPB 400 mg     400 mg 100 mL/hr over 120 Minutes Intravenous Every 24 hours 04/29/18 1006 05/27/18 0959   04/27/18 1500  meropenem (MERREM) 1 g in sodium chloride 0.9 % 100 mL IVPB  Status:  Discontinued     1 g 200 mL/hr over 30 Minutes Intravenous Every 12 hours 04/27/18 1008 05/01/18 1539   04/26/18 1300  metroNIDAZOLE (FLAGYL) IVPB 500 mg  Status:  Discontinued     500 mg 100 mL/hr over 60 Minutes Intravenous Every 6 hours 04/26/18 1257 04/27/18 1041   04/15/18 1700  meropenem (  MERREM) 1 g in sodium chloride 0.9 % 100 mL IVPB  Status:  Discontinued     1 g 200 mL/hr over 30 Minutes Intravenous Every 8 hours 04/15/18 1534 04/27/18 1008   04/09/18 2200  meropenem (MERREM) 1 g in sodium chloride 0.9 % 100 mL IVPB  Status:  Discontinued     1 g 200 mL/hr over  30 Minutes Intravenous Every 8 hours 04/09/18 1244 04/14/18 0944   04/06/18 1300  meropenem (MERREM) 1 g in sodium chloride 0.9 % 100 mL IVPB  Status:  Discontinued     1 g 200 mL/hr over 30 Minutes Intravenous Every 12 hours 04/06/18 1145 04/09/18 1244   04/06/18 1145  meropenem (MERREM) 1 g in sodium chloride 0.9 % 100 mL IVPB  Status:  Discontinued     1 g 200 mL/hr over 30 Minutes Intravenous Every 8 hours 04/06/18 1139 04/06/18 1145   04/06/18 0900  ciprofloxacin (CIPRO) IVPB 400 mg  Status:  Discontinued     400 mg 200 mL/hr over 60 Minutes Intravenous Every 12 hours 04/06/18 0732 04/06/18 1139   04/06/18 0300  metroNIDAZOLE (FLAGYL) IVPB 500 mg  Status:  Discontinued     500 mg 100 mL/hr over 60 Minutes Intravenous Every 8 hours 04/05/18 2335 04/06/18 1139   04/05/18 1930  ciprofloxacin (CIPRO) IVPB 400 mg     400 mg 200 mL/hr over 60 Minutes Intravenous  Once 04/05/18 1925 04/05/18 2217   04/05/18 1930  metroNIDAZOLE (FLAGYL) IVPB 500 mg     500 mg 100 mL/hr over 60 Minutes Intravenous  Once 04/05/18 1925 04/05/18 2102      LOS: 34 days   Signature  Lala Lund M.D on 05/11/2018 at 9:51 AM   -  To page go to [email or web address] - password [facility]

## 2018-05-11 NOTE — Progress Notes (Signed)
PHARMACY CONSULT NOTE FOR:  OUTPATIENT  PARENTERAL ANTIBIOTIC THERAPY (OPAT)  Indication: Intraabdominal abscess Regimen: Vanc 1.25g IV q12, zosyn 3.375g IV q6, fluconazole 400mg  PO q24 End date: 06/07/18  IV antibiotic discharge orders are pended. To discharging provider:  please sign these orders via discharge navigator,  Select New Orders & click on the button choice - Manage This Unsigned Work.     Onnie Boer, PharmD, BCIDP, AAHIVP, CPP Infectious Disease Pharmacist 05/11/2018 1:20 PM

## 2018-05-11 NOTE — Progress Notes (Signed)
Subjective: Feeling better.   Antibiotics:  Anti-infectives (From admission, onward)   Start     Dose/Rate Route Frequency Ordered Stop   05/12/18 0200  vancomycin (VANCOCIN) 1,250 mg in sodium chloride 0.9 % 250 mL IVPB     1,250 mg 166.7 mL/hr over 90 Minutes Intravenous Every 12 hours 05/11/18 1235     05/11/18 1300  vancomycin (VANCOCIN) 1,500 mg in sodium chloride 0.9 % 500 mL IVPB     1,500 mg 250 mL/hr over 120 Minutes Intravenous  Once 05/11/18 1233     05/08/18 0815  piperacillin-tazobactam (ZOSYN) IVPB 3.375 g     3.375 g 12.5 mL/hr over 240 Minutes Intravenous Every 8 hours 05/08/18 0801     05/05/18 1245  meropenem (MERREM) 1 g in sodium chloride 0.9 % 100 mL IVPB  Status:  Discontinued     1 g 200 mL/hr over 30 Minutes Intravenous Every 8 hours 05/05/18 1245 05/06/18 1006   05/01/18 1600  meropenem (MERREM) 1 g in sodium chloride 0.9 % 100 mL IVPB  Status:  Discontinued     1 g 200 mL/hr over 30 Minutes Intravenous Every 8 hours 05/01/18 1539 05/05/18 1243   04/30/18 1215  fluconazole (DIFLUCAN) IVPB 400 mg  Status:  Discontinued     400 mg 100 mL/hr over 120 Minutes Intravenous To Surgery 04/30/18 1200 05/01/18 1215   04/29/18 1100  fluconazole (DIFLUCAN) IVPB 400 mg     400 mg 100 mL/hr over 120 Minutes Intravenous Every 24 hours 04/29/18 1006 05/27/18 0959   04/27/18 1500  meropenem (MERREM) 1 g in sodium chloride 0.9 % 100 mL IVPB  Status:  Discontinued     1 g 200 mL/hr over 30 Minutes Intravenous Every 12 hours 04/27/18 1008 05/01/18 1539   04/26/18 1300  metroNIDAZOLE (FLAGYL) IVPB 500 mg  Status:  Discontinued     500 mg 100 mL/hr over 60 Minutes Intravenous Every 6 hours 04/26/18 1257 04/27/18 1041   04/15/18 1700  meropenem (MERREM) 1 g in sodium chloride 0.9 % 100 mL IVPB  Status:  Discontinued     1 g 200 mL/hr over 30 Minutes Intravenous Every 8 hours 04/15/18 1534 04/27/18 1008   04/09/18 2200  meropenem (MERREM) 1 g in sodium chloride 0.9  % 100 mL IVPB  Status:  Discontinued     1 g 200 mL/hr over 30 Minutes Intravenous Every 8 hours 04/09/18 1244 04/14/18 0944   04/06/18 1300  meropenem (MERREM) 1 g in sodium chloride 0.9 % 100 mL IVPB  Status:  Discontinued     1 g 200 mL/hr over 30 Minutes Intravenous Every 12 hours 04/06/18 1145 04/09/18 1244   04/06/18 1145  meropenem (MERREM) 1 g in sodium chloride 0.9 % 100 mL IVPB  Status:  Discontinued     1 g 200 mL/hr over 30 Minutes Intravenous Every 8 hours 04/06/18 1139 04/06/18 1145   04/06/18 0900  ciprofloxacin (CIPRO) IVPB 400 mg  Status:  Discontinued     400 mg 200 mL/hr over 60 Minutes Intravenous Every 12 hours 04/06/18 0732 04/06/18 1139   04/06/18 0300  metroNIDAZOLE (FLAGYL) IVPB 500 mg  Status:  Discontinued     500 mg 100 mL/hr over 60 Minutes Intravenous Every 8 hours 04/05/18 2335 04/06/18 1139   04/05/18 1930  ciprofloxacin (CIPRO) IVPB 400 mg     400 mg 200 mL/hr over 60 Minutes Intravenous  Once 04/05/18 1925 04/05/18 2217  04/05/18 1930  metroNIDAZOLE (FLAGYL) IVPB 500 mg     500 mg 100 mL/hr over 60 Minutes Intravenous  Once 04/05/18 1925 04/05/18 2102      Medications: Scheduled Meds: . acetaminophen  500 mg Oral Q8H  . Chlorhexidine Gluconate Cloth  6 each Topical Daily  . digoxin  0.25 mg Oral Daily  . diltiazem  60 mg Oral Q8H  . famotidine  20 mg Oral BID  . ferrous sulfate  325 mg Oral BID WC  . folic acid  1 mg Oral Daily  . guaiFENesin  600 mg Oral BID  . insulin aspart  0-15 Units Subcutaneous TID WC  . insulin aspart  0-5 Units Subcutaneous QHS  . latanoprost  1 drop Both Eyes QHS  . mouth rinse  15 mL Mouth Rinse BID  . methocarbamol  1,000 mg Oral QID  . nystatin   Topical TID  . pregabalin  75 mg Oral QHS  . timolol  1 drop Both Eyes BID   Continuous Infusions: . sodium chloride 500 mL (05/11/18 0204)  . fluconazole (DIFLUCAN) IV 400 mg (05/11/18 1049)  . heparin 1,150 Units/hr (05/11/18 0900)  . magnesium sulfate 1 - 4 g  bolus IVPB 50 mL/hr at 05/07/18 1057  . piperacillin-tazobactam (ZOSYN)  IV 12.5 mL/hr at 05/11/18 0900  . [START ON 05/12/2018] vancomycin    . vancomycin     PRN Meds:.sodium chloride, albuterol, diltiazem, diphenhydrAMINE, hydrALAZINE, HYDROcodone-acetaminophen, morphine injection, ondansetron (ZOFRAN) IV, phenol, sodium chloride    Objective: Weight change:   Intake/Output Summary (Last 24 hours) at 05/11/2018 1243 Last data filed at 05/11/2018 0900 Gross per 24 hour  Intake 2318.86 ml  Output 1640 ml  Net 678.86 ml   Blood pressure 134/69, pulse 86, temperature 97.8 F (36.6 C), resp. rate 18, height 5' 5.5" (1.664 m), weight 108.7 kg, SpO2 95 %. Temp:  [97.8 F (36.6 C)-98.5 F (36.9 C)] 97.8 F (36.6 C) (12/01 0556) Pulse Rate:  [86-99] 86 (12/01 0556) Resp:  [18] 18 (12/01 0556) BP: (127-158)/(62-114) 134/69 (12/01 0556) SpO2:  [93 %-97 %] 95 % (12/01 0556)  Physical Exam: General: Alert and awake, oriented x3, not in any acute distress. HEENT: anicteric sclera, EOMI CVS regular rate, normal  Chest: , no wheezing, no respiratory distress Abdomen: Is not distended drains that I can see appear to have bilious material in them.   Neuro: nonfocal  CBC:    BMET Recent Labs    05/10/18 0906 05/11/18 0321  NA 138 136  K 3.7 3.6  CL 99 99  CO2 26 26  GLUCOSE 95 88  BUN 22 23  CREATININE 1.00 0.93  CALCIUM 7.8* 7.8*     Liver Panel  No results for input(s): PROT, ALBUMIN, AST, ALT, ALKPHOS, BILITOT, BILIDIR, IBILI in the last 72 hours.     Sedimentation Rate No results for input(s): ESRSEDRATE in the last 72 hours. C-Reactive Protein No results for input(s): CRP in the last 72 hours.  Micro Results: Recent Results (from the past 720 hour(s))  Aerobic/Anaerobic Culture (surgical/deep wound)     Status: Abnormal   Collection Time: 04/16/18  3:13 PM  Result Value Ref Range Status   Specimen Description PELVIS ABSCESS  Final   Special Requests NONE   Final   Gram Stain   Final    FEW WBC PRESENT,BOTH PMN AND MONONUCLEAR FEW GRAM POSITIVE COCCI IN CLUSTERS FEW GRAM POSITIVE RODS    Culture (A)  Final    MULTIPLE  ORGANISMS PRESENT, NONE PREDOMINANT NO ANAEROBES ISOLATED Performed at [facility], [place] [address], [place], [place] [address]    Report Status 04/22/2018 FINAL  Final  Aerobic/Anaerobic Culture (surgical/deep wound)     Status: None   Collection Time: 04/16/18  3:13 PM  Result Value Ref Range Status   Specimen Description ABSCESS PELVIS  Final   Special Requests SAMPLE NO 2  Final   Gram Stain   Final    FEW WBC PRESENT,BOTH PMN AND MONONUCLEAR NO ORGANISMS SEEN Performed at [facility], [address], [place], [place] [address]    Culture   Final    RARE CLOSTRIDIUM CLOSTRIDIOFORME RARE PEPTOSTREPTOCOCCUS ASACCHAROLYTICUS    Report Status 04/23/2018 FINAL  Final  Aerobic/Anaerobic Culture (surgical/deep wound)     Status: None   Collection Time: 04/23/18  3:20 PM  Result Value Ref Range Status   Specimen Description ABSCESS RIGHT LOWER QUADRANT  Final   Special Requests Normal  Final   Gram Stain NO WBC SEEN NO ORGANISMS SEEN   Final   Culture   Final    FEW CANDIDA TROPICALIS NO ANAEROBES ISOLATED Performed at [facility], [address], [place], [place] [address]    Report Status 04/28/2018 FINAL  Final  C difficile quick scan w PCR reflex     Status: Abnormal   Collection Time: 04/28/18  9:01 PM  Result Value Ref Range Status   C Diff antigen POSITIVE (A) NEGATIVE Final   C Diff toxin NEGATIVE NEGATIVE Final   C Diff interpretation Results are indeterminate. See PCR results.  Final    Comment: Performed at [facility], [place] [address], [place], [place] [address]  C. Diff by PCR, Reflexed     Status: None   Collection Time: 04/28/18  9:01 PM  Result Value Ref Range Status   Toxigenic C. Difficile by PCR NEGATIVE NEGATIVE Final    Comment: Patient is colonized with non  toxigenic C. difficile. May not need treatment unless significant symptoms are present. Performed at [facility], [place] [address], [place], [place] [address]   Surgical pcr screen     Status: None   Collection Time: 04/29/18  6:05 PM  Result Value Ref Range Status   MRSA, PCR NEGATIVE NEGATIVE Final   Staphylococcus aureus NEGATIVE NEGATIVE Final    Comment: (NOTE) The Xpert SA Assay (FDA approved for NASAL specimens in patients 19 years of age and older), is one component of a comprehensive surveillance program. It is not intended to diagnose infection nor to guide or monitor treatment. Performed at [facility], [place] [address], [place], [place] [address]   Aerobic Culture (superficial specimen)     Status: None   Collection Time: 05/07/18 10:43 AM  Result Value Ref Range Status   Specimen Description WOUND  Final   Special Requests NONE  Final   Gram Stain   Final    FEW WBC PRESENT, PREDOMINANTLY PMN FEW GRAM POSITIVE COCCI Performed at [facility], [place] [address], [place], [place] [address]    Culture MODERATE ENTEROCOCCUS RAFFINOSUS  Final   Report Status 05/10/2018 FINAL  Final   Organism ID, Bacteria ENTEROCOCCUS RAFFINOSUS  Final      Susceptibility   Enterococcus raffinosus - MIC*    AMPICILLIN 16 RESISTANT Resistant     VANCOMYCIN <=0.5 SENSITIVE Sensitive     GENTAMICIN SYNERGY RESISTANT Resistant     * MODERATE ENTEROCOCCUS  RAFFINOSUS    Studies/Results: No results found.    Assessment/Plan:  INTERVAL HISTORY: Seems to have improved clinically after being placed on Zosyn and fluconazole   Principal Problem:   Diverticulitis of large intestine with perforation and abscess Active Problems:   DM (diabetes mellitus) (HCC)   HTN (hypertension)   Rheumatoid arthritis (HCC)   Anemia   Renal insufficiency   Sepsis due to undetermined organism (HCC)   CKD (chronic kidney disease) stage 3, GFR 30-59 ml/min (HCC)   Fibromyalgia   Acute  renal failure superimposed on stage 3 chronic kidney disease (HCC)    [name] is a 72 y.o. female multiple comorbidities including obesity, admitted 30 days ago for perforated diverticulitis with multiple intra abdominal infection. initially attempted multiple drains then ex lap and ileostomy + 3 drains as of 11/20. She worsened off antibiotics and CT showed new fluid collections now we will to drainage.  Similarly repositioning of drains could not facilitate improvement in these areas.  Dr. Graylon Good restarted Zosyn along with fluconazole and the patient is improved since then  Have the patient complete 4 to 6 weeks of Zosyn and fluconazole with interval repeat CT scan  She should be scheduled in our clinic prior to stopping abx  I will Sign off for now please call with any further questions.   LOS: 34 days   [name] 05/11/2018, 12:43 PM

## 2018-05-11 NOTE — Progress Notes (Signed)
[place] for heparin Indication: acute DVT  Allergies  Allergen Reactions  . Lactose Intolerance (Gi) Other (See Comments)    G.I. Upset  . Butrans [Buprenorphine] Rash and Other (See Comments)    Infected skin underneath application  . Penicillins Rash    Facial rash Has patient had a PCN reaction causing immediate rash, facial/tongue/throat swelling, SOB or lightheadedness with hypotension: Yes Has patient had a PCN reaction causing severe rash involving mucus membranes or skin necrosis: No Has patient had a PCN reaction that required hospitalization No Has patient had a PCN reaction occurring within the last 10 years: Yes If all of the above answers are "NO", then may proceed with Cephalosporin use.   . Simvastatin Rash    Patient Measurements: Height: 5' 5.5" (166.4 cm) Weight: 239 lb 10.2 oz (108.7 kg) IBW/kg (Calculated) : 58.15 Heparin Dosing Weight: 85 kg  Vital Signs: Temp: 98.6 F (37 C) (12/01 1622) Temp Source: Oral (12/01 1622) BP: 113/58 (12/01 1622) Pulse Rate: 79 (12/01 1622)  Labs: Recent Labs    05/09/18 0429  05/10/18 0906 05/10/18 2043 05/11/18 0321 05/11/18 1555  HGB 7.9*  --  7.9*  --  7.8*  --   HCT 24.9*  --  25.9*  --  24.1*  --   PLT 341  --  322  --  313  --   HEPARINUNFRC  --    < > 0.54 0.52 0.38 0.43  CREATININE 1.00  --  1.00  --  0.93  --    < > = values in this interval not displayed.    Estimated Creatinine Clearance: 66.7 mL/min (by C-G formula based on SCr of 0.93 mg/dL).   Medical History: Past Medical History:  Diagnosis Date  . Anginal pain [facility])    sees Dr Einar Gip.   . Arthritis    rheumatoid ..   . Diabetes mellitus without complication (HCC)    Metformin 2.3.2017  . Diverticulitis   . Diverticulitis of large intestine with perforation and abscess 04/05/2018  . Dysrhythmia   . Fibromyalgia   . GERD (gastroesophageal reflux disease)   . High cholesterol   . Hypertension    . Liver disease 08- 2012   per Dr Dagmar Hait pt has enlarged liver  . Peripheral vascular disease (HCC)    legs  . Pneumonia 06/17/2016  . Sleep apnea    sleep study  oct 2012  . Stroke [facility]) 2011   [place]      Medications:  Medications Prior to Admission  Medication Sig Dispense Refill Last Dose  . Blood Glucose Monitoring Suppl (FREESTYLE LITE) DEVI See admin instructions.  0 04/05/2018 at Unknown time  . ciprofloxacin (CIPRO) 500 MG/5ML (10%) suspension Take by mouth 2 (two) times daily.   04/05/2018 at Unknown time  . clopidogrel (PLAVIX) 75 MG tablet Take 1 tablet (75 mg total) by mouth daily with breakfast. 90 tablet 6 04/05/2018 at 0800  . Dexlansoprazole 30 MG capsule Take 40 mg by mouth daily.   04/05/2018 at Unknown time  . diclofenac sodium (VOLTAREN) 1 % GEL Apply 2 g topically 4 (four) times daily as needed (Pain).    04/05/2018 at Unknown time  . glipiZIDE (GLUCOTROL) 10 MG tablet Take 10 mg by mouth daily before breakfast.   04/05/2018 at Unknown time  . HYDROcodone-acetaminophen (NORCO) 10-325 MG tablet Take 1 tablet by mouth 4 (four) times daily.   04/05/2018 at Unknown time  . IRON PO Take  1 tablet by mouth daily.   04/05/2018 at Unknown time  . isosorbide dinitrate (ISORDIL) 5 MG tablet Take 1 tablet (5 mg total) by mouth 3 (three) times daily before meals. 90 tablet 1 04/05/2018 at Unknown time  . leflunomide (ARAVA) 20 MG tablet Take 20 mg by mouth daily.   04/05/2018 at Unknown time  . metoprolol succinate (TOPROL-XL) 25 MG 24 hr tablet Take 25 mg by mouth daily.   04/05/2018 at 0800  . metroNIDAZOLE (FLAGYL) 250 MG tablet Take 250 mg by mouth 3 (three) times daily.   04/05/2018 at Unknown time  . Olmesartan-amLODIPine-HCTZ (TRIBENZOR) 40-5-25 MG TABS Take 1 tablet by mouth daily.    04/05/2018 at Unknown time  . pravastatin (PRAVACHOL) 40 MG tablet Take 40 mg by mouth at bedtime.   04/05/2018 at Unknown time  . predniSONE (DELTASONE) 5 MG tablet Take 5 mg by  mouth daily with breakfast.   04/05/2018 at Unknown time  . pregabalin (LYRICA) 75 MG capsule Take 75 mg by mouth 3 (three) times daily.   04/05/2018 at Unknown time  . Tafluprost (ZIOPTAN) 0.0015 % SOLN Place 1 drop into both eyes at bedtime.    04/05/2018 at Unknown time  . temazepam (RESTORIL) 30 MG capsule Take 30 mg by mouth at bedtime.   04/05/2018 at Unknown time  . timolol (BETIMOL) 0.5 % ophthalmic solution Place 1 drop into both eyes 2 (two) times daily.    04/05/2018 at Unknown time  . Tofacitinib Citrate (XELJANZ) 5 MG TABS Take 1 tablet by mouth daily.   04/05/2018 at Unknown time  . VITAMIN D, ERGOCALCIFEROL, PO Take 50,000 Units by mouth once a week. Thursdays   04/05/2018 at Unknown time  . docusate sodium (COLACE) 100 MG capsule Take 2 capsules (200 mg total) by mouth at bedtime. (Patient taking differently: Take 200 mg by mouth as needed. 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 RUE duplex was rechecked which showed DVT remains and propagated. Pharmacy consulted to resume IV heparin. Will keep heparin level on low end of normal and monitor closely -heparin level= 0.43 on 1150 units/hr   Goal of Therapy:  Heparin level 0.3-0.5 units/ml  Monitor platelets by anticoagulation protocol: Yes   Plan:   -Continue heparin at 1150 units/hr -Daily heparin level and CBC  Hildred Laser, PharmD Clinical Pharmacist **Pharmacist phone directory can now be found on [email or web address] ([email or web address]).  Listed under [place].

## 2018-05-11 NOTE — Progress Notes (Signed)
Pharmacy Antibiotic Note  [name] is a 72 y.o. female admitted on 04/05/2018 with wound .  Pharmacy has been consulted for vanc dosing.  Pt has has very complicated case of perforated diverticulitis abscess. She has been on multiple course of abx. Her most recent wound culture grew out enterococcus raffinosus which is resistant to zosyn. Vanc is ordered to be added today.   Scr 0.93  Plan: Vanc 1.5g IV x1 then 1.25g IV q12 Trough as needed  Height: 5' 5.5" (166.4 cm) Weight: 239 lb 10.2 oz (108.7 kg) IBW/kg (Calculated) : 58.15  Temp (24hrs), Avg:98.1 F (36.7 C), Min:97.8 F (36.6 C), Max:98.5 F (36.9 C)  Recent Labs  Lab 05/07/18 0348 05/08/18 0349 05/09/18 0429 05/10/18 0906 05/11/18 0321  WBC 19.1* 19.8* 16.2* 13.8* 11.7*  CREATININE 1.12* 1.18* 1.00 1.00 0.93    Estimated Creatinine Clearance: 66.7 mL/min (by C-G formula based on SCr of 0.93 mg/dL).    Allergies  Allergen Reactions  . Lactose Intolerance (Gi) Other (See Comments)    G.I. Upset  . Butrans [Buprenorphine] Rash and Other (See Comments)    Infected skin underneath application  . Penicillins Rash    Facial rash Has patient had a PCN reaction causing immediate rash, facial/tongue/throat swelling, SOB or lightheadedness with hypotension: Yes Has patient had a PCN reaction causing severe rash involving mucus membranes or skin necrosis: No Has patient had a PCN reaction that required hospitalization No Has patient had a PCN reaction occurring within the last 10 years: Yes If all of the above answers are "NO", then may proceed with Cephalosporin use.   . Simvastatin Rash    Antimicrobials this admission: Cipro 10/26>>10/27 Flagyl 10/26>>10/27: 11/16>11/17 Merrem 10/27>>11/4,  11/5 >> 11/26 Fluconazole 11/19>> (12/16)  (canadida tropicalis) Zosyn 11/28 >> Vanc 12/1>>  Dose adjustments this admission:  Microbiology results: 11/27 Wound Cx: enterococcus raffinosus R-amp/gent,  S-vanc 11/19 MRSA PCR: neg 11/18 Cdiff PCR antigen positive, toxin negative 11/18 Cdiff toxin PCR: neg 11/13 RLQ abscess CX >> reincubated , few canadida tropicalis pending 11/6: Pelvic abscess >> ngtd , holding for possible anaerobe, peptostreptococcus, and clostridium/Final 11/6: Pelvic abscess (x2) >> multiple organisms>>none predominant, no anerobes isolated-Final 10/28 MRSA PCR >> neg 10/26Bcx >> negative  Onnie Boer, PharmD, [place], AAHIVP, CPP Infectious Disease Pharmacist 05/11/2018 12:47 PM

## 2018-05-12 LAB — CBC
HCT: 24.5 % — ABNORMAL LOW (ref 36.0–46.0)
Hemoglobin: 7.7 g/dL — ABNORMAL LOW (ref 12.0–15.0)
MCH: 29.4 pg (ref 26.0–34.0)
MCHC: 31.4 g/dL (ref 30.0–36.0)
MCV: 93.5 fL (ref 80.0–100.0)
Platelets: 324 10*3/uL (ref 150–400)
RBC: 2.62 MIL/uL — ABNORMAL LOW (ref 3.87–5.11)
RDW: 18.6 % — ABNORMAL HIGH (ref 11.5–15.5)
WBC: 12.6 10*3/uL — ABNORMAL HIGH (ref 4.0–10.5)
nRBC: 1 % — ABNORMAL HIGH (ref 0.0–0.2)

## 2018-05-12 LAB — GLUCOSE, CAPILLARY
GLUCOSE-CAPILLARY: 124 mg/dL — AB (ref 70–99)
Glucose-Capillary: 83 mg/dL (ref 70–99)
Glucose-Capillary: 99 mg/dL (ref 70–99)
Glucose-Capillary: 94 mg/dL (ref 70–99)

## 2018-05-12 LAB — HEPARIN LEVEL (UNFRACTIONATED)
Heparin Unfractionated: 0.29 IU/mL — ABNORMAL LOW (ref 0.30–0.70)
Heparin Unfractionated: 0.39 IU/mL (ref 0.30–0.70)

## 2018-05-12 MED ORDER — POTASSIUM CHLORIDE CRYS ER 20 MEQ PO TBCR
40.0000 meq | EXTENDED_RELEASE_TABLET | Freq: Once | ORAL | Status: AC
  Administered 2018-05-12: 40 meq via ORAL
  Filled 2018-05-12: qty 2

## 2018-05-12 NOTE — Progress Notes (Signed)
Physical Therapy Treatment Patient Details Name: [name] MRN: [id number] DOB: 1946-05-03 Today's Date: 05/12/2018    History of Present Illness [name]  is a 72 y.o. female with PMH including but not limited to hypertension, hyperlipidemia, dm2, pvd, CVA, OSA, Rheumatoid arthritis with c/o LLQ pain over the past several weeks. Pt found to have acute perforated sigmoid diverticulitis with multiple abscesses. Pt is s/p abdominal abscess drains X2.     PT Comments    Pt admitted with above diagnosis. Pt currently with functional limitations due to balance and endurance deficits. Pt was able to take a few steps with RW with cues and mod assist of 2.  Made some progress however slow.  Updated Exercise Program and sent to pts email.  Pt will benefit from skilled PT to increase their independence and safety with mobility to allow discharge to the venue listed below.     Follow Up Recommendations  SNF     Equipment Recommendations  None recommended by PT    Recommendations for Other Services       Precautions / Restrictions Precautions Precautions: Fall;Other (comment) Precaution Comments: 2 L LQ jp drains, 1 R LQ drain, abdominal wound VAC Restrictions Weight Bearing Restrictions: No    Mobility  Bed Mobility Overal bed mobility: Needs Assistance Bed Mobility: Supine to Sit;Sit to Supine Rolling: Min assist Sidelying to sit: HOB elevated;Mod assist Supine to sit: Mod assist     General bed mobility comments: increased time and effort, assist for trunk elevation and for LES off bed  Transfers Overall transfer level: Needs assistance Equipment used: Rolling walker (2 wheeled) Transfers: Sit to/from Stand Sit to Stand: From elevated surface;Mod assist;+2 physical assistance         General transfer comment: increased time and effort, use of momentum, and mod A to power into standing from an elevated bed  Ambulation/Gait Ambulation/Gait assistance: Mod  assist;+2 physical assistance Gait Distance (Feet): 4 Feet Assistive device: Rolling walker (2 wheeled) Gait Pattern/deviations: Step-through pattern;Decreased step length - right;Decreased step length - left;Decreased stride length;Trunk flexed;Wide base of support;Leaning posteriorly;Antalgic   Gait velocity interpretation: <1.31 ft/sec, indicative of household ambulator General Gait Details: Pt was able to ambulate with RW with mod assist and cues needing cues for postural stability and for sequencing steps.  Pt with notable knee instability and ultimately had to bring chair behind pt to sit down as she was too fatigued to continue.  Pt attempted to stand again x 3 but could not stand from chair.     Stairs             Wheelchair Mobility    Modified Rankin (Stroke Patients Only)       Balance Overall balance assessment: Needs assistance Sitting-balance support: Feet supported Sitting balance-Leahy Scale: Fair Sitting balance - Comments: Patient sitting upright on EOB with Min guard for safety.   Standing balance support: Bilateral upper extremity supported Standing balance-Leahy Scale: Poor Standing balance comment: Pt reliant on RW and external assist                            Cognition Arousal/Alertness: Awake/alert Behavior During Therapy: WFL for tasks assessed/performed Overall Cognitive Status: Within Functional Limits for tasks assessed                                 General Comments: Husband present  Exercises General Exercises - Lower Extremity Ankle Circles/Pumps: AROM;Both;20 reps;Supine Quad Sets: AROM;Both;10 reps;Supine Gluteal Sets: AROM;Both;10 reps;Supine Long Arc Quad: Seated;Strengthening;AROM;Both;10 reps    General Comments General comments (skin integrity, edema, etc.): continues to be somewhat self limiting.       Pertinent Vitals/Pain Pain Assessment: Faces Faces Pain Scale: Hurts whole lot Pain  Location: Abdomen Pain Descriptors / Indicators: Discomfort;Grimacing;Constant Pain Intervention(s): Limited activity within patient's tolerance;Monitored during session;Repositioned;Patient requesting pain meds-RN notified    Home Living                      Prior Function            PT Goals (current goals can now be found in the care plan section) Acute Rehab PT Goals Patient Stated Goal: none stated Progress towards PT goals: Progressing toward goals    Frequency    Min 3X/week      PT Plan Current plan remains appropriate    Co-evaluation              AM-PAC PT "6 Clicks" Mobility   Outcome Measure  Help needed turning from your back to your side while in a flat bed without using bedrails?: A Lot Help needed moving from lying on your back to sitting on the side of a flat bed without using bedrails?: A Lot Help needed moving to and from a bed to a chair (including a wheelchair)?: A Lot Help needed standing up from a chair using your arms (e.g., wheelchair or bedside chair)?: A Lot Help needed to walk in hospital room?: A Lot Help needed climbing 3-5 steps with a railing? : Total 6 Click Score: 11    End of Session Equipment Utilized During Treatment: Gait belt Activity Tolerance: Patient limited by fatigue;Patient limited by pain Patient left: with call bell/phone within reach;with family/visitor present;in chair Nurse Communication: Mobility status;Need for lift equipment(told nurse need for [facility]) PT Visit Diagnosis: Other abnormalities of gait and mobility (R26.89);Muscle weakness (generalized) (M62.81);Pain Pain - part of body: (abdomen)     Time: 1113-1140 PT Time Calculation (min) (ACUTE ONLY): 27 min  Charges:  $Gait Training: 8-22 mins $Therapeutic Exercise: 8-22 mins                     [place] Pager:  [phone]  Office:  [place] 05/12/2018, 1:27 PM

## 2018-05-12 NOTE — Consult Note (Signed)
[place] Nurse wound follow up Wound type: Midline incision Measurement: 23cm x 5cm x 6cm Wound LYY:TKPT pink, fibrinous yellow slough in wound bed, sutures visible Drainage (amount, consistency, odor) small amount serous Periwound:intact, dry Dressing procedure/placement/frequency: Patient premedicated for pain as she expressed anxiety regarding dressing change. NPWT dressing removed and 2 pieces of black foam extracted.  Wound cleansed with NS, and gently patted dry. [facility] PA J. Focht in to see wound/ostomy and photograph (see her note).  Hospitalist in to see.  Two pieces of black foam used to obliterate dead space and this is then covered with drape. An aperture is cut into drape to which the [facility] pad is attached. Negative pressure is resumed at 125 mmHG and an immediate seal is achieved.  Patient tolerated procedure well. Next dressing change is due on Wednesday, December 4.  [place] Nurse ostomy follow up Stoma type/location: RLQ ileostomy Stomal assessment/size: 1 and 1/4 inches, red, moist, budded, os as center. Peristomal assessment: Intact Treatment options for stomal/peristomal skin: skin barrier ring and convex pouching system. Output: liquid brown/green effluent. Ostomy pouching: 1pc.convex pouching system cut off-center to allow space for VAC drape and provide option for pouch changes twice weekly (Monday/Friday) with NPWT changes three times weekly (M/W/F). Education provided: Husband reports reading all Scientist, clinical (histocompatibility and immunogenetics).  Reports that daughter emptied pouch yesterday independently.  States he is not able to take care of ostomy at this time. Reinforced that ostomy pouching system will need to be emptied every 3-4 hours. Patient and wife report that daughter lives down the road and that they anticipate being discharged to a Rehab facility near to their home. He is asking whether or not his wife will be home for Christmas and he is encouraged to discuss that further with her medical  team. Enrolled patient in [place] [facility] Discharge program: Yes, previously.   [place] nursing team will follow along and remain available to this patient, the nursing, surgical and medical teams.   Thanks, Maudie Flakes, MSN, RN, [place], Arther Abbott  Pager# [phone]

## 2018-05-12 NOTE — Progress Notes (Addendum)
[facility]/Trauma Progress Note  12 Days Post-Op   Assessment/Plan HLD 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 - on plavix (last dose 10/27) H/o CVA  Morbidly obese Chronic pain RUE DVT -was onIV heparin,then treatment dose lovenox- lovenox held 11/26  Acute on chronic anemia - hgb stable, follow  Acute perforated sigmoid diverticulitis with multiple abscesses -s/pIR perc drains x 2 - 04/16/18, culture with multiple organisms  -S/p3rdIR drain 11/13, culturegrowingFEW CANDIDA TROPICALIS  S/p Exploratory laparotomy, extensive lysis of adhesions, small bowel resection, loop ileostomy, drainage of multiple intra-abdominal abscesses, placement of wound VAC11/20/19 Dr. Georgette Dover -path negativefor malignancy - afebrile, WBC trendingdown(12.6today) - midline vac MWF - CT11/27showed 3 new fluid collections that IR is not able to drain - ID recs Zosyn & fluconazole - agree with  ID -merrem 10/27-11/26, flagyl 11/16>>11/17;Zosyn 11/28>> FEN -SOFT diet, Prealbumin19.4 , ensure Foley -external cath  Plan-POpain control,abxper ID. Encourage mobilization, likely discharge to SNF this week. 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 Nothing to do surgical at this time. Will monitor. May need to replace bulb drain with bag if output continues to be high.    LOS: 35 days    Subjective: CC: S/P Hartmans  No issues overnight. Pt is smiling and drinking coffee. Husband at bedside.   Objective: Vital signs in last 24 hours: Temp:  [97.9 F (36.6 C)-98.7 F (37.1 C)] 97.9 F (36.6 C) (12/02 0524) Pulse Rate:  [79-92] 92 (12/02 0524) Resp:  [18] 18 (12/01 1622) BP: (113-121)/(54-66) 121/54 (12/02 0524) SpO2:  [90 %-97 %] 97 % (12/02 0524) Last BM Date: 05/11/18  Intake/Output from previous day: 12/01 0701 - 12/02 0700 In: 99.3 [I.V.:65.5; IV Piggyback:33.8] Out: 750 [Urine:200; Drains:150;  Stool:400] Intake/Output this shift: No intake/output data recorded.  PE: Gen:  Alert, NAD, pleasant, cooperative Pulm:  Rate and effort normal Abd: Soft, obese, ND, +BS, midline see photo below, R drain with yellow cloudy drainage, L sided surgical drains x 2 (1 with copious dark green output that looks like stool, one with dark brown clear drainage) mild generalized TTP without guarding, no peritonitis  Skin: no rashes noted, warm and dry      Anti-infectives: Anti-infectives (From admission, onward)   Start     Dose/Rate Route Frequency Ordered Stop   05/12/18 0200  vancomycin (VANCOCIN) 1,250 mg in sodium chloride 0.9 % 250 mL IVPB     1,250 mg 166.7 mL/hr over 90 Minutes Intravenous Every 12 hours 05/11/18 1235     05/11/18 1300  vancomycin (VANCOCIN) 1,500 mg in sodium chloride 0.9 % 500 mL IVPB     1,500 mg 250 mL/hr over 120 Minutes Intravenous  Once 05/11/18 1233 05/11/18 2000   05/11/18 1300  fluconazole (DIFLUCAN) tablet 400 mg     400 mg Oral Daily 05/11/18 1250 06/08/18 0959   05/08/18 0815  piperacillin-tazobactam (ZOSYN) IVPB 3.375 g     3.375 g 12.5 mL/hr over 240 Minutes Intravenous Every 8 hours 05/08/18 0801 06/07/18 2359   05/05/18 1245  meropenem (MERREM) 1 g in sodium chloride 0.9 % 100 mL IVPB  Status:  Discontinued     1 g 200 mL/hr over 30 Minutes Intravenous Every 8 hours 05/05/18 1245 05/06/18 1006   05/01/18 1600  meropenem (MERREM) 1 g in sodium chloride 0.9 % 100 mL IVPB  Status:  Discontinued     1 g 200 mL/hr over 30 Minutes Intravenous Every 8 hours 05/01/18  1539 05/05/18 1243   04/30/18 1215  fluconazole (DIFLUCAN) IVPB 400 mg  Status:  Discontinued     400 mg 100 mL/hr over 120 Minutes Intravenous To Surgery 04/30/18 1200 05/01/18 1215   04/29/18 1100  fluconazole (DIFLUCAN) IVPB 400 mg  Status:  Discontinued     400 mg 100 mL/hr over 120 Minutes Intravenous Every 24 hours 04/29/18 1006 05/11/18 1250   04/27/18 1500  meropenem (MERREM) 1 g in  sodium chloride 0.9 % 100 mL IVPB  Status:  Discontinued     1 g 200 mL/hr over 30 Minutes Intravenous Every 12 hours 04/27/18 1008 05/01/18 1539   04/26/18 1300  metroNIDAZOLE (FLAGYL) IVPB 500 mg  Status:  Discontinued     500 mg 100 mL/hr over 60 Minutes Intravenous Every 6 hours 04/26/18 1257 04/27/18 1041   04/15/18 1700  meropenem (MERREM) 1 g in sodium chloride 0.9 % 100 mL IVPB  Status:  Discontinued     1 g 200 mL/hr over 30 Minutes Intravenous Every 8 hours 04/15/18 1534 04/27/18 1008   04/09/18 2200  meropenem (MERREM) 1 g in sodium chloride 0.9 % 100 mL IVPB  Status:  Discontinued     1 g 200 mL/hr over 30 Minutes Intravenous Every 8 hours 04/09/18 1244 04/14/18 0944   04/06/18 1300  meropenem (MERREM) 1 g in sodium chloride 0.9 % 100 mL IVPB  Status:  Discontinued     1 g 200 mL/hr over 30 Minutes Intravenous Every 12 hours 04/06/18 1145 04/09/18 1244   04/06/18 1145  meropenem (MERREM) 1 g in sodium chloride 0.9 % 100 mL IVPB  Status:  Discontinued     1 g 200 mL/hr over 30 Minutes Intravenous Every 8 hours 04/06/18 1139 04/06/18 1145   04/06/18 0900  ciprofloxacin (CIPRO) IVPB 400 mg  Status:  Discontinued     400 mg 200 mL/hr over 60 Minutes Intravenous Every 12 hours 04/06/18 0732 04/06/18 1139   04/06/18 0300  metroNIDAZOLE (FLAGYL) IVPB 500 mg  Status:  Discontinued     500 mg 100 mL/hr over 60 Minutes Intravenous Every 8 hours 04/05/18 2335 04/06/18 1139   04/05/18 1930  ciprofloxacin (CIPRO) IVPB 400 mg     400 mg 200 mL/hr over 60 Minutes Intravenous  Once 04/05/18 1925 04/05/18 2217   04/05/18 1930  metroNIDAZOLE (FLAGYL) IVPB 500 mg     500 mg 100 mL/hr over 60 Minutes Intravenous  Once 04/05/18 1925 04/05/18 2102      Lab Results:  Recent Labs    05/11/18 0321 05/12/18 0338  WBC 11.7* 12.6*  HGB 7.8* 7.7*  HCT 24.1* 24.5*  PLT 313 324   BMET Recent Labs    05/10/18 0906 05/11/18 0321  NA 138 136  K 3.7 3.6  CL 99 99  CO2 26 26  GLUCOSE 95  88  BUN 22 23  CREATININE 1.00 0.93  CALCIUM 7.8* 7.8*   PT/INR No results for input(s): LABPROT, INR in the last 72 hours. CMP     Component Value Date/Time   NA 136 05/11/2018 0321   K 3.6 05/11/2018 0321   CL 99 05/11/2018 0321   CO2 26 05/11/2018 0321   GLUCOSE 88 05/11/2018 0321   BUN 23 05/11/2018 0321   CREATININE 0.93 05/11/2018 0321   CALCIUM 7.8 (L) 05/11/2018 0321   PROT 5.0 (L) 05/07/2018 0348   ALBUMIN 1.8 (L) 05/07/2018 0348   AST 44 (H) 05/07/2018 0348   ALT 42 05/07/2018 0348  ALKPHOS 101 05/07/2018 0348   BILITOT 0.6 05/07/2018 0348   GFRNONAA >60 05/11/2018 0321   GFRAA >60 05/11/2018 0321   Lipase  No results found for: LIPASE  Studies/Results: No results found.    Kalman Drape , [facility] 05/12/2018, 8:23 AM  Pager: [phone] Mon-Wed, Friday 7:00am-4:30pm Thurs 7am-11:30am  Consults: [phone]

## 2018-05-12 NOTE — Progress Notes (Signed)
PROGRESS NOTE    [name]  ZCH:[id number] DOB: 10-26-1945 DOA: 04/05/2018 PCP: [name], MD    Brief Narrative:   72 year old female with past medical history of hypertension, type 2 diabetes mellitus chronic kidney disease rheumatoid arthritis coronary artery disease S/P CABG on Plavix who was admitted with abdominal pain and found to have sigmoid diverticulitis on April 05, 2018 with perforation and abscess.  Initially was felt to be improving on IV Merrem but she continued to have abdominal pain. CT scan of the abdomen and pelvis was obtained April 15, 2018 and was significant for worsening diverticulitis with abdominal fluid collections, she was transferred to [facility] for IR evaluation, he did receive total of 3 percutaneous drains, with repeat CT abdomen pelvis 04/27/2018, still showing remaining fluid collection/abscess despite he strains, and appropriate antibiotic coverage, patient went for exploratory laparotomy with lysis of adhesion, small bowel resection with loop ileostomy, and placement of wound VAC by Dr. Georgette Dover 04/30/2018.  SUBJECTIVE:  Patient in bed, appears comfortable, denies any headache, no fever, no chest pain or pressure, no shortness of breath , no abdominal pain. No focal weakness.  Assessment & Plan:    Perforated sigmoid diverticulitis/abdominal abscess : General surgery following, initially managed conservatively with multiple abdominal drain placements however repeat CT scan on 04/27/2018 showed worsening of abscess and disease process after which she was taken to the OR for laparotomy with lysis of adhesion, small bowel resection, loop ileostomy and placement of wound VAC by Dr. Georgette Dover on 04/30/2018.  Abscess cultures noted which are growing Candida, Clostridium along with Peptostreptococcus and now ampicillin resistant Enterococcus.  She had originally finished 4 weeks of meropenem.  [place] on IV Zosyn, IV Vancomycin and IV Diflucan.  Continue these  for at least 1 month.  Close surgery and ID follow-up.  1 of the drains draining feculent material suggesting development of fistula which surgically is aware of, but currently no surgical intervention at this time.  Defer management of this complex abdominal issue to general surgery.  Have discussed with general surgery the patient likely will be discharged to SNF tomorrow.   Acute right upper extremity DVT extremity  - continue full dose anticoagulation, right upper extremity ultrasound was repeated showing continued presence of DVT.  Will place her on Lovenox along with Coumadin overlap.  Anemia of critical illness.  Some element of dilution but her ileostomy output appears dark, despite holding PNA and diuresing her with Lasix her H&H is going down suggesting some ongoing blood loss, continue IV PPI, anemia panel was inconclusive she received 1 unit of packed RBC on 05/07/2018 with stabilization of H&H.  Will reinitiate heparin drip with caution and continue to monitor H&H.  Acute kidney injury with chronic kidney disease stage III  - recent creatinine of 1.2, ARF resolved currently close to baseline.  Fibromyalgia - continue Lyrica able to take oral  Hypertension improved.  - Blood pressure stable on low-dose Cardizem.  Advanced weakness and deconditioning -   PT has been consulted they recommend SNF.  Patient encouraged to sit up in the chair rather than laying in bed.  Counseled patient and husband multiple times including 05/07/2018, 05/08/2018.  Acute illness required anemia on chronic anemia  - she required total of 4 units PRBC transfusion during hospital stay, monitor closely and transfuse as needed .  Depression patient recently started on Lexapro  Paroxysmal atrial tachycardia.  She has not had any more episodes of PAT since that of April 22, 2018 echocardiogram that was done during showed preserved ejection fraction she has stable TSH, on low-dose Cardizem will adjust as blood  pressure permits, due to borderline soft pressures she had to be placed on digoxin which she has tolerated well with good rate control now.  Hypomagnesemia - replaced IV, recheck in am.  Type 2 diabetes mellitus controlled  - Continue with insulin sliding scale every 4 hours as she is on TPN, CBG acceptable .  CBG (last 3)  Recent Labs    05/11/18 1618 05/11/18 2236 05/12/18 0813  GLUCAP 148* 93 83       DVT prophylaxis: Subcutaneous Lovenox treatment dose for right upper extremity DVT  code Status: Full Family Communication: family at bedside  05/04/18 Disposition Plan: Physical therapy recommended SNF  Consultants:   General surgery  IR  ID - phone Dr Johnnye Sima 05/05/18  Procedures:  - Echocardiogram -   Left ventricle: The cavity size was normal. Wall thickness was increased in a pattern of moderate LVH. Systolic function was  vigorous. The estimated ejection fraction was in the range of 65% to 70%. Indeterminant diastolic function. Wall motion was normal; there were no regional wall motion abnormalities. - Aortic valve: Valve area (VTI): 2.74 cm^2. Valve area (Vmax):   2.58 cm^2. Valve area (Vmean): 2.56 cm^2.  -S/PIR perc drains x 2 - 04/16/18, culture with multiple organisms present/none predominant -S/P3rdIR drain 11/13, culturegrowingFEW CANDIDA TROPICALIS, - Right arm PICC line - had DVT and was remioved -  Left arm PICC line  - Exploratory laparotomy with lysis of adhesion, small bowel resection with loop ileostomy, and placement of wound VAC by Dr. Georgette Dover 04/30/2018.  - CT 11/27 - 1. 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 Diverticular changes throughout decompressed bowel without definite focal inflammation. There is some wall thickening in the sigmoid colon. 2. Existing drainage catheters are not associated with any significant residual fluid collections. 3. Three separate fluid collections are increasing, as described. 4.  Aortic  Atherosclerosis (ICD10-I70.0). 5. Bibasilar airspace disease is likely atelectasis.    Objective:  Vitals:   05/11/18 1622 05/11/18 2154 05/12/18 0524 05/12/18 1032  BP: (!) 113/58 116/66 (!) 121/54   Pulse: 79 88 92 69  Resp: 18     Temp: 98.6 F (37 C) 98.7 F (37.1 C) 97.9 F (36.6 C)   TempSrc: Oral Oral Oral   SpO2: 90% 95% 97%   Weight:      Height:        Intake/Output Summary (Last 24 hours) at 05/12/2018 1056 Last data filed at 05/11/2018 1300 Gross per 24 hour  Intake -  Output 700 ml  Net -700 ml   Filed Weights   05/09/18 0500 05/09/18 1100 05/10/18 0314  Weight: 113.9 kg 113.9 kg 108.7 kg    Examination:  Awake Alert, Oriented X 3, No new F.N deficits, Normal affect [place].AT,PERRAL Supple Neck,No JVD, No cervical lymphadenopathy appriciated.  Symmetrical Chest wall movement, Good air movement bilaterally, CTAB RRR,No Gallops, Rubs or new Murmurs, No Parasternal Heave +ve B.Sounds, Abd Soft,  midline surgical wound VAC, right ileostomyileostomy bag, left JP drain with sanguinous material . No Cyanosis, Clubbing or edema, No new Rash or bruise   Data Reviewed: I have personally reviewed following labs and imaging studies  CBC: Recent Labs  Lab 05/08/18 0349 05/09/18 0429 05/10/18 0906 05/11/18 0321 05/12/18 0338  WBC 19.8* 16.2* 13.8* 11.7* 12.6*  HGB 8.1* 7.9* 7.9* 7.8* 7.7*  HCT 25.6* 24.9* 25.9* 24.1*  24.5*  MCV 90.1 91.2 92.2 92.0 93.5  PLT 333 341 322 313 253   Basic Metabolic Panel: Recent Labs  Lab 05/06/18 0406 05/07/18 0348 05/08/18 0349 05/09/18 0429 05/10/18 0906 05/11/18 0321  NA 143 141 138 136 138 136  K 4.0 3.9 3.6 3.3* 3.7 3.6  CL 102 101 99 101 99 99  CO2 31 30 28 28 26 26   GLUCOSE 113* 121* 121* 90 95 88  BUN 50* 48* 49* 32* 22 23  CREATININE 1.14* 1.12* 1.18* 1.00 1.00 0.93  CALCIUM 8.1* 7.9* 7.9* 7.8* 7.8* 7.8*  MG 1.8 1.5* 1.9 1.8 1.4* 2.1  PHOS 5.7* 4.7*  --  3.6  --   --    GFR: Estimated Creatinine  Clearance: 66.7 mL/min (by C-G formula based on SCr of 0.93 mg/dL). Liver Function Tests: Recent Labs  Lab 05/07/18 0348  AST 44*  ALT 42  ALKPHOS 101  BILITOT 0.6  PROT 5.0*  ALBUMIN 1.8*   No results for input(s): LIPASE, AMYLASE in the last 168 hours. No results for input(s): AMMONIA in the last 168 hours. Coagulation Profile: No results for input(s): INR, PROTIME in the last 168 hours. Cardiac Enzymes: No results for input(s): CKTOTAL, CKMB, CKMBINDEX, TROPONINI in the last 168 hours. BNP (last 3 results) No results for input(s): PROBNP in the last 8760 hours. HbA1C: No results for input(s): HGBA1C in the last 72 hours. CBG: Recent Labs  Lab 05/11/18 0742 05/11/18 1227 05/11/18 1618 05/11/18 2236 05/12/18 0813  GLUCAP 84 106* 148* 93 83   Lipid Profile: No results for input(s): CHOL, HDL, LDLCALC, TRIG, CHOLHDL, LDLDIRECT in the last 72 hours. Thyroid Function Tests: No results for input(s): TSH, T4TOTAL, FREET4, T3FREE, THYROIDAB in the last 72 hours. Anemia Panel: No results for input(s): VITAMINB12, FOLATE, FERRITIN, TIBC, IRON, RETICCTPCT in the last 72 hours. Urine analysis:    Component Value Date/Time   COLORURINE YELLOW 04/05/2018 1924   APPEARANCEUR CLEAR 04/05/2018 1924   LABSPEC 1.021 04/05/2018 1924   PHURINE 5.0 04/05/2018 1924   GLUCOSEU NEGATIVE 04/05/2018 1924   HGBUR NEGATIVE 04/05/2018 1924   BILIRUBINUR NEGATIVE 04/05/2018 [place] NEGATIVE 04/05/2018 1924   PROTEINUR NEGATIVE 04/05/2018 1924   UROBILINOGEN 0.2 06/20/2013 1832   NITRITE NEGATIVE 04/05/2018 1924   LEUKOCYTESUR NEGATIVE 04/05/2018 1924   Sepsis Labs: @LABRCNTIP (procalcitonin:4,lacticidven:4)  ) Recent Results (from the past 240 hour(s))  Aerobic Culture (superficial specimen)     Status: None   Collection Time: 05/07/18 10:43 AM  Result Value Ref Range Status   Specimen Description WOUND  Final   Special Requests NONE  Final   Gram Stain   Final    FEW WBC  PRESENT, PREDOMINANTLY PMN FEW GRAM POSITIVE COCCI Performed at [facility], [place] [address], [place], [place] [address]    Culture MODERATE ENTEROCOCCUS RAFFINOSUS  Final   Report Status 05/10/2018 FINAL  Final   Organism ID, Bacteria ENTEROCOCCUS RAFFINOSUS  Final      Susceptibility   Enterococcus raffinosus - MIC*    AMPICILLIN 16 RESISTANT Resistant     VANCOMYCIN <=0.5 SENSITIVE Sensitive     GENTAMICIN SYNERGY RESISTANT Resistant     * MODERATE ENTEROCOCCUS RAFFINOSUS     Radiology Studies: No results found.  Scheduled Meds:  . acetaminophen  500 mg Oral Q8H  . Chlorhexidine Gluconate Cloth  6 each Topical Daily  . digoxin  0.25 mg Oral Daily  . diltiazem  60 mg Oral Q8H  .  famotidine  20 mg Oral BID  . ferrous sulfate  325 mg Oral BID WC  . fluconazole  400 mg Oral Daily  . folic acid  1 mg Oral Daily  . guaiFENesin  600 mg Oral BID  . insulin aspart  0-15 Units Subcutaneous TID WC  . insulin aspart  0-5 Units Subcutaneous QHS  . latanoprost  1 drop Both Eyes QHS  . mouth rinse  15 mL Mouth Rinse BID  . methocarbamol  1,000 mg Oral QID  . nystatin   Topical TID  . pregabalin  75 mg Oral QHS  . timolol  1 drop Both Eyes BID   Continuous Infusions: . sodium chloride 500 mL (05/11/18 0204)  . heparin 1,250 Units/hr (05/12/18 0830)  . magnesium sulfate 1 - 4 g bolus IVPB 50 mL/hr at 05/07/18 1057  . piperacillin-tazobactam (ZOSYN)  IV 3.375 g (05/12/18 0532)  . vancomycin 1,250 mg (05/12/18 0531)   Anti-infectives (From admission, onward)   Start     Dose/Rate Route Frequency Ordered Stop   05/12/18 0200  vancomycin (VANCOCIN) 1,250 mg in sodium chloride 0.9 % 250 mL IVPB     1,250 mg 166.7 mL/hr over 90 Minutes Intravenous Every 12 hours 05/11/18 1235     05/11/18 1300  vancomycin (VANCOCIN) 1,500 mg in sodium chloride 0.9 % 500 mL IVPB     1,500 mg 250 mL/hr over 120 Minutes Intravenous  Once 05/11/18 1233 05/11/18 2000   05/11/18 1300  fluconazole  (DIFLUCAN) tablet 400 mg     400 mg Oral Daily 05/11/18 1250 06/08/18 0959   05/08/18 0815  piperacillin-tazobactam (ZOSYN) IVPB 3.375 g     3.375 g 12.5 mL/hr over 240 Minutes Intravenous Every 8 hours 05/08/18 0801 06/07/18 2359   05/05/18 1245  meropenem (MERREM) 1 g in sodium chloride 0.9 % 100 mL IVPB  Status:  Discontinued     1 g 200 mL/hr over 30 Minutes Intravenous Every 8 hours 05/05/18 1245 05/06/18 1006   05/01/18 1600  meropenem (MERREM) 1 g in sodium chloride 0.9 % 100 mL IVPB  Status:  Discontinued     1 g 200 mL/hr over 30 Minutes Intravenous Every 8 hours 05/01/18 1539 05/05/18 1243   04/30/18 1215  fluconazole (DIFLUCAN) IVPB 400 mg  Status:  Discontinued     400 mg 100 mL/hr over 120 Minutes Intravenous To Surgery 04/30/18 1200 05/01/18 1215   04/29/18 1100  fluconazole (DIFLUCAN) IVPB 400 mg  Status:  Discontinued     400 mg 100 mL/hr over 120 Minutes Intravenous Every 24 hours 04/29/18 1006 05/11/18 1250   04/27/18 1500  meropenem (MERREM) 1 g in sodium chloride 0.9 % 100 mL IVPB  Status:  Discontinued     1 g 200 mL/hr over 30 Minutes Intravenous Every 12 hours 04/27/18 1008 05/01/18 1539   04/26/18 1300  metroNIDAZOLE (FLAGYL) IVPB 500 mg  Status:  Discontinued     500 mg 100 mL/hr over 60 Minutes Intravenous Every 6 hours 04/26/18 1257 04/27/18 1041   04/15/18 1700  meropenem (MERREM) 1 g in sodium chloride 0.9 % 100 mL IVPB  Status:  Discontinued     1 g 200 mL/hr over 30 Minutes Intravenous Every 8 hours 04/15/18 1534 04/27/18 1008   04/09/18 2200  meropenem (MERREM) 1 g in sodium chloride 0.9 % 100 mL IVPB  Status:  Discontinued     1 g 200 mL/hr over 30 Minutes Intravenous Every 8 hours 04/09/18 1244 04/14/18 0944  04/06/18 1300  meropenem (MERREM) 1 g in sodium chloride 0.9 % 100 mL IVPB  Status:  Discontinued     1 g 200 mL/hr over 30 Minutes Intravenous Every 12 hours 04/06/18 1145 04/09/18 1244   04/06/18 1145  meropenem (MERREM) 1 g in sodium chloride  0.9 % 100 mL IVPB  Status:  Discontinued     1 g 200 mL/hr over 30 Minutes Intravenous Every 8 hours 04/06/18 1139 04/06/18 1145   04/06/18 0900  ciprofloxacin (CIPRO) IVPB 400 mg  Status:  Discontinued     400 mg 200 mL/hr over 60 Minutes Intravenous Every 12 hours 04/06/18 0732 04/06/18 1139   04/06/18 0300  metroNIDAZOLE (FLAGYL) IVPB 500 mg  Status:  Discontinued     500 mg 100 mL/hr over 60 Minutes Intravenous Every 8 hours 04/05/18 2335 04/06/18 1139   04/05/18 1930  ciprofloxacin (CIPRO) IVPB 400 mg     400 mg 200 mL/hr over 60 Minutes Intravenous  Once 04/05/18 1925 04/05/18 2217   04/05/18 1930  metroNIDAZOLE (FLAGYL) IVPB 500 mg     500 mg 100 mL/hr over 60 Minutes Intravenous  Once 04/05/18 1925 04/05/18 2102      LOS: 35 days   Signature  Lala Lund M.D on 05/12/2018 at 10:56 AM   -  To page go to [email or web address] - password [email or web address]

## 2018-05-12 NOTE — Progress Notes (Signed)
[place] for heparin Indication: acute DVT  Allergies  Allergen Reactions  . Lactose Intolerance (Gi) Other (See Comments)    G.I. Upset  . Butrans [Buprenorphine] Rash and Other (See Comments)    Infected skin underneath application  . Penicillins Rash    Facial rash Has patient had a PCN reaction causing immediate rash, facial/tongue/throat swelling, SOB or lightheadedness with hypotension: Yes Has patient had a PCN reaction causing severe rash involving mucus membranes or skin necrosis: No Has patient had a PCN reaction that required hospitalization No Has patient had a PCN reaction occurring within the last 10 years: Yes If all of the above answers are "NO", then may proceed with Cephalosporin use.   . Simvastatin Rash    Patient Measurements: Height: 5' 5.5" (166.4 cm) Weight: 239 lb 10.2 oz (108.7 kg) IBW/kg (Calculated) : 58.15 Heparin Dosing Weight: 85 kg  Vital Signs: Temp: 97.9 F (36.6 C) (12/02 0524) Temp Source: Oral (12/02 0524) BP: 121/54 (12/02 0524) Pulse Rate: 92 (12/02 0524)  Labs: Recent Labs    05/10/18 0906  05/11/18 0321 05/11/18 1555 05/12/18 0338  HGB 7.9*  --  7.8*  --  7.7*  HCT 25.9*  --  24.1*  --  24.5*  PLT 322  --  313  --  324  HEPARINUNFRC 0.54   < > 0.38 0.43 0.29*  CREATININE 1.00  --  0.93  --   --    < > = values in this interval not displayed.    Estimated Creatinine Clearance: 66.7 mL/min (by C-G formula based on SCr of 0.93 mg/dL).   Medical History: Past Medical History:  Diagnosis Date  . Anginal pain [facility])    sees Dr Einar Gip.   . Arthritis    rheumatoid ..   . Diabetes mellitus without complication (HCC)    Metformin 2.3.2017  . Diverticulitis   . Diverticulitis of large intestine with perforation and abscess 04/05/2018  . Dysrhythmia   . Fibromyalgia   . GERD (gastroesophageal reflux disease)   . High cholesterol   . Hypertension   . Liver disease 08- 2012   per Dr  Dagmar Hait pt has enlarged liver  . Peripheral vascular disease (HCC)    legs  . Pneumonia 06/17/2016  . Sleep apnea    sleep study  oct 2012  . Stroke [facility]) 2011   [place]      Medications:  Medications Prior to Admission  Medication Sig Dispense Refill Last Dose  . Blood Glucose Monitoring Suppl (FREESTYLE LITE) DEVI See admin instructions.  0 04/05/2018 at Unknown time  . ciprofloxacin (CIPRO) 500 MG/5ML (10%) suspension Take by mouth 2 (two) times daily.   04/05/2018 at Unknown time  . clopidogrel (PLAVIX) 75 MG tablet Take 1 tablet (75 mg total) by mouth daily with breakfast. 90 tablet 6 04/05/2018 at 0800  . Dexlansoprazole 30 MG capsule Take 40 mg by mouth daily.   04/05/2018 at Unknown time  . diclofenac sodium (VOLTAREN) 1 % GEL Apply 2 g topically 4 (four) times daily as needed (Pain).    04/05/2018 at Unknown time  . glipiZIDE (GLUCOTROL) 10 MG tablet Take 10 mg by mouth daily before breakfast.   04/05/2018 at Unknown time  . HYDROcodone-acetaminophen (NORCO) 10-325 MG tablet Take 1 tablet by mouth 4 (four) times daily.   04/05/2018 at Unknown time  . IRON PO Take 1 tablet by mouth daily.   04/05/2018 at Unknown time  . isosorbide dinitrate (  ISORDIL) 5 MG tablet Take 1 tablet (5 mg total) by mouth 3 (three) times daily before meals. 90 tablet 1 04/05/2018 at Unknown time  . leflunomide (ARAVA) 20 MG tablet Take 20 mg by mouth daily.   04/05/2018 at Unknown time  . metoprolol succinate (TOPROL-XL) 25 MG 24 hr tablet Take 25 mg by mouth daily.   04/05/2018 at 0800  . metroNIDAZOLE (FLAGYL) 250 MG tablet Take 250 mg by mouth 3 (three) times daily.   04/05/2018 at Unknown time  . Olmesartan-amLODIPine-HCTZ (TRIBENZOR) 40-5-25 MG TABS Take 1 tablet by mouth daily.    04/05/2018 at Unknown time  . pravastatin (PRAVACHOL) 40 MG tablet Take 40 mg by mouth at bedtime.   04/05/2018 at Unknown time  . predniSONE (DELTASONE) 5 MG tablet Take 5 mg by mouth daily with breakfast.    04/05/2018 at Unknown time  . pregabalin (LYRICA) 75 MG capsule Take 75 mg by mouth 3 (three) times daily.   04/05/2018 at Unknown time  . Tafluprost (ZIOPTAN) 0.0015 % SOLN Place 1 drop into both eyes at bedtime.    04/05/2018 at Unknown time  . temazepam (RESTORIL) 30 MG capsule Take 30 mg by mouth at bedtime.   04/05/2018 at Unknown time  . timolol (BETIMOL) 0.5 % ophthalmic solution Place 1 drop into both eyes 2 (two) times daily.    04/05/2018 at Unknown time  . Tofacitinib Citrate (XELJANZ) 5 MG TABS Take 1 tablet by mouth daily.   04/05/2018 at Unknown time  . VITAMIN D, ERGOCALCIFEROL, PO Take 50,000 Units by mouth once a week. Thursdays   04/05/2018 at Unknown time  . docusate sodium (COLACE) 100 MG capsule Take 2 capsules (200 mg total) by mouth at bedtime. (Patient taking differently: Take 200 mg by mouth as needed. 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 RUE duplex was rechecked which showed DVT remains and propagated. Pharmacy consulted to resume IV heparin. Will keep heparin level on low end of normal and monitor closely -heparin level= 0.29 on 1150 units/hr  D/w MD, may be going to surgery tonight. Will continue heparin this evening and will likely start enoxaparin and warfarin tomorrow.     Goal of Therapy:  Heparin level 0.3-0.5 units/ml  Monitor platelets by anticoagulation protocol: Yes   Plan:   -Continue heparin at 1250 units/hr - HL in 8 hours - Daily heparin level and CBC -- will likely start enoxaparin and warfarin tomorrow 12/3  Zendayah Hardgrave A. Levada Dy, PharmD, [place] Pager: [phone] Please utilize Amion for appropriate phone number to reach the unit pharmacist ([place])

## 2018-05-12 NOTE — Progress Notes (Signed)
[place] for heparin Indication: acute DVT  Allergies  Allergen Reactions  . Lactose Intolerance (Gi) Other (See Comments)    G.I. Upset  . Butrans [Buprenorphine] Rash and Other (See Comments)    Infected skin underneath application  . Penicillins Rash    Facial rash Has patient had a PCN reaction causing immediate rash, facial/tongue/throat swelling, SOB or lightheadedness with hypotension: Yes Has patient had a PCN reaction causing severe rash involving mucus membranes or skin necrosis: No Has patient had a PCN reaction that required hospitalization No Has patient had a PCN reaction occurring within the last 10 years: Yes If all of the above answers are "NO", then may proceed with Cephalosporin use.   . Simvastatin Rash    Patient Measurements: Height: 5' 5.5" (166.4 cm) Weight: 239 lb 10.2 oz (108.7 kg) IBW/kg (Calculated) : 58.15 Heparin Dosing Weight: 85 kg  Vital Signs: Temp: 98 F (36.7 C) (12/02 1505) Temp Source: Oral (12/02 1505) BP: 111/68 (12/02 1505) Pulse Rate: 78 (12/02 1505)  Labs: Recent Labs    05/10/18 0906  05/11/18 0321 05/11/18 1555 05/12/18 0338 05/12/18 1605  HGB 7.9*  --  7.8*  --  7.7*  --   HCT 25.9*  --  24.1*  --  24.5*  --   PLT 322  --  313  --  324  --   HEPARINUNFRC 0.54   < > 0.38 0.43 0.29* 0.39  CREATININE 1.00  --  0.93  --   --   --    < > = values in this interval not displayed.    Estimated Creatinine Clearance: 66.7 mL/min (by C-G formula based on SCr of 0.93 mg/dL).   Medical History: Past Medical History:  Diagnosis Date  . Anginal pain [facility])    sees Dr Einar Gip.   . Arthritis    rheumatoid ..   . Diabetes mellitus without complication (HCC)    Metformin 2.3.2017  . Diverticulitis   . Diverticulitis of large intestine with perforation and abscess 04/05/2018  . Dysrhythmia   . Fibromyalgia   . GERD (gastroesophageal reflux disease)   . High cholesterol   . Hypertension    . Liver disease 08- 2012   per Dr Dagmar Hait pt has enlarged liver  . Peripheral vascular disease (HCC)    legs  . Pneumonia 06/17/2016  . Sleep apnea    sleep study  oct 2012  . Stroke [facility]) 2011   [place]      Medications:  Medications Prior to Admission  Medication Sig Dispense Refill Last Dose  . Blood Glucose Monitoring Suppl (FREESTYLE LITE) DEVI See admin instructions.  0 04/05/2018 at Unknown time  . ciprofloxacin (CIPRO) 500 MG/5ML (10%) suspension Take by mouth 2 (two) times daily.   04/05/2018 at Unknown time  . clopidogrel (PLAVIX) 75 MG tablet Take 1 tablet (75 mg total) by mouth daily with breakfast. 90 tablet 6 04/05/2018 at 0800  . Dexlansoprazole 30 MG capsule Take 40 mg by mouth daily.   04/05/2018 at Unknown time  . diclofenac sodium (VOLTAREN) 1 % GEL Apply 2 g topically 4 (four) times daily as needed (Pain).    04/05/2018 at Unknown time  . glipiZIDE (GLUCOTROL) 10 MG tablet Take 10 mg by mouth daily before breakfast.   04/05/2018 at Unknown time  . HYDROcodone-acetaminophen (NORCO) 10-325 MG tablet Take 1 tablet by mouth 4 (four) times daily.   04/05/2018 at Unknown time  . IRON PO Take  1 tablet by mouth daily.   04/05/2018 at Unknown time  . isosorbide dinitrate (ISORDIL) 5 MG tablet Take 1 tablet (5 mg total) by mouth 3 (three) times daily before meals. 90 tablet 1 04/05/2018 at Unknown time  . leflunomide (ARAVA) 20 MG tablet Take 20 mg by mouth daily.   04/05/2018 at Unknown time  . metoprolol succinate (TOPROL-XL) 25 MG 24 hr tablet Take 25 mg by mouth daily.   04/05/2018 at 0800  . metroNIDAZOLE (FLAGYL) 250 MG tablet Take 250 mg by mouth 3 (three) times daily.   04/05/2018 at Unknown time  . Olmesartan-amLODIPine-HCTZ (TRIBENZOR) 40-5-25 MG TABS Take 1 tablet by mouth daily.    04/05/2018 at Unknown time  . pravastatin (PRAVACHOL) 40 MG tablet Take 40 mg by mouth at bedtime.   04/05/2018 at Unknown time  . predniSONE (DELTASONE) 5 MG tablet Take 5 mg by  mouth daily with breakfast.   04/05/2018 at Unknown time  . pregabalin (LYRICA) 75 MG capsule Take 75 mg by mouth 3 (three) times daily.   04/05/2018 at Unknown time  . Tafluprost (ZIOPTAN) 0.0015 % SOLN Place 1 drop into both eyes at bedtime.    04/05/2018 at Unknown time  . temazepam (RESTORIL) 30 MG capsule Take 30 mg by mouth at bedtime.   04/05/2018 at Unknown time  . timolol (BETIMOL) 0.5 % ophthalmic solution Place 1 drop into both eyes 2 (two) times daily.    04/05/2018 at Unknown time  . Tofacitinib Citrate (XELJANZ) 5 MG TABS Take 1 tablet by mouth daily.   04/05/2018 at Unknown time  . VITAMIN D, ERGOCALCIFEROL, PO Take 50,000 Units by mouth once a week. Thursdays   04/05/2018 at Unknown time  . docusate sodium (COLACE) 100 MG capsule Take 2 capsules (200 mg total) by mouth at bedtime. (Patient taking differently: Take 200 mg by mouth as needed. 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 RUE duplex was rechecked which showed DVT remains and propagated. Pharmacy consulted to resume IV heparin. Will keep heparin level on low end of normal and monitor closely.  Heparin level therapeutic on 1250 units/hr.  No change.  Goal of Therapy:  Heparin level 0.3-0.5 units/ml  Monitor platelets by anticoagulation protocol: Yes   Plan:   Continue heparin at 1250 units/hr Daily heparin level and CBC Anticipate transition to enoxaparin and warfarin tomorrow 12/3  Horton Chin, Pharm.D., BCPS Clinical Pharmacist  **Pharmacist phone directory can now be found on [email or web address] ([email or web address]).  Listed under [place].  05/12/2018 5:54 PM

## 2018-05-12 NOTE — Progress Notes (Signed)
Supervising Physician: Corrie Mckusick  Patient Status:  [facility] - In-pt  Chief Complaint: Follow-up RLQ diverticular abscess drain  Subjective:  Patient in chair with husband present. She reports she's ok, depressed about being in the hospital and all of the problems she has had. She denies any complaints with her drains.   Allergies: Lactose intolerance (gi); Butrans [buprenorphine]; Penicillins; and Simvastatin  Medications: Prior to Admission medications   Medication Sig Start Date End Date Taking? Authorizing Provider  Blood Glucose Monitoring Suppl (FREESTYLE LITE) DEVI See admin instructions. 12/22/14  Yes [provider]  ciprofloxacin (CIPRO) 500 MG/5ML (10%) suspension Take by mouth 2 (two) times daily.   Yes [provider]  clopidogrel (PLAVIX) 75 MG tablet Take 1 tablet (75 mg total) by mouth daily with breakfast. 01/14/18  Yes Rehman, Mechele Dawley, MD  Dexlansoprazole 30 MG capsule Take 40 mg by mouth daily.   Yes [provider]  diclofenac sodium (VOLTAREN) 1 % GEL Apply 2 g topically 4 (four) times daily as needed (Pain).    Yes [provider]  glipiZIDE (GLUCOTROL) 10 MG tablet Take 10 mg by mouth daily before breakfast.   Yes [provider]  HYDROcodone-acetaminophen (NORCO) 10-325 MG tablet Take 1 tablet by mouth 4 (four) times daily.   Yes [provider]  IRON PO Take 1 tablet by mouth daily.   Yes [provider]  isosorbide dinitrate (ISORDIL) 5 MG tablet Take 1 tablet (5 mg total) by mouth 3 (three) times daily before meals. 02/07/18  Yes Rehman, Mechele Dawley, MD  leflunomide (ARAVA) 20 MG tablet Take 20 mg by mouth daily.   Yes [provider]  metoprolol succinate (TOPROL-XL) 25 MG 24 hr tablet Take 25 mg by mouth daily.   Yes [provider]  metroNIDAZOLE (FLAGYL) 250 MG tablet Take 250 mg by mouth 3 (three) times daily.   Yes [provider]  Olmesartan-amLODIPine-HCTZ (TRIBENZOR)  40-5-25 MG TABS Take 1 tablet by mouth daily.    Yes [provider]  pravastatin (PRAVACHOL) 40 MG tablet Take 40 mg by mouth at bedtime.   Yes [provider]  predniSONE (DELTASONE) 5 MG tablet Take 5 mg by mouth daily with breakfast.   Yes [provider]  pregabalin (LYRICA) 75 MG capsule Take 75 mg by mouth 3 (three) times daily.   Yes [provider]  Tafluprost (ZIOPTAN) 0.0015 % SOLN Place 1 drop into both eyes at bedtime.    Yes [provider]  temazepam (RESTORIL) 30 MG capsule Take 30 mg by mouth at bedtime.   Yes [provider]  timolol (BETIMOL) 0.5 % ophthalmic solution Place 1 drop into both eyes 2 (two) times daily.    Yes [provider]  Tofacitinib Citrate (XELJANZ) 5 MG TABS Take 1 tablet by mouth daily.   Yes [provider]  VITAMIN D, ERGOCALCIFEROL, PO Take 50,000 Units by mouth once a week. Thursdays   Yes [provider]  docusate sodium (COLACE) 100 MG capsule Take 2 capsules (200 mg total) by mouth at bedtime. Patient taking differently: Take 200 mg by mouth as needed.  02/13/18   Rogene Houston, MD     Vital Signs: BP (!) 121/54 (BP Location: Left Leg)   Pulse 69   Temp 97.9 F (36.6 C) (Oral)   Resp 18   Ht 5' 5.5" (1.664 m)   Wt 239 lb 10.2 oz (108.7 kg)   SpO2 97%   BMI  39.27 kg/m   Physical Exam  Constitutional: No distress.  HENT:  Head: Normocephalic.  Cardiovascular: Normal rate, regular rhythm and normal heart sounds.  Pulmonary/Chest: Effort normal.  Abdominal: Soft.  Midline wound vac present, LUQ drain to JP with dark green OP. RLQ drain to GB with scant milky pink OP.   Neurological: She is alert.  Skin: Skin is warm and dry. She is not diaphoretic.  Nursing note and vitals reviewed.   Imaging: Vas [place] Upper Extremity Venous Duplex  Result Date: 05/08/2018 UPPER VENOUS STUDY  Indications: Follow up of DVT found in one of the brachial veins 04/15/18  Performing Technologist: Sharion Dove RVS  Examination Guidelines: A complete evaluation includes B-mode imaging, spectral Doppler, color Doppler, and power Doppler as needed of all accessible portions of each vessel. Bilateral testing is considered an integral part of a complete examination. Limited examinations for reoccurring indications may be performed as noted.  Right Findings: +----------+------------+----------+---------+-----------+-----------------+ RIGHT     CompressiblePropertiesPhasicitySpontaneous     Summary      +----------+------------+----------+---------+-----------+-----------------+ IJV           Full                 Yes       Yes                      +----------+------------+----------+---------+-----------+-----------------+ Subclavian                         Yes       Yes                      +----------+------------+----------+---------+-----------+-----------------+ Axillary      None                                  Age Indeterminate +----------+------------+----------+---------+-----------+-----------------+ Brachial    Partial                                 Age Indeterminate +----------+------------+----------+---------+-----------+-----------------+ Cephalic      Full                                                    +----------+------------+----------+---------+-----------+-----------------+ Basilic       Full                                                    +----------+------------+----------+---------+-----------+-----------------+  Left Findings: +----------+------------+----------+---------+-----------+-------+ LEFT      CompressiblePropertiesPhasicitySpontaneousSummary +----------+------------+----------+---------+-----------+-------+ Subclavian                         Yes       Yes            +----------+------------+----------+---------+-----------+-------+  Summary:  Right: Findings consistent with age  indeterminate deep vein thrombosis involving the right brachial veins and right axillary vein. DVT found in one of the brachial veins, 04/15/18, remains. DVT appears to have propagated in to the axillary vein.  Left: No evidence of thrombosis  in the subclavian.  *See table(s) above for measurements and observations.  Diagnosing physician: Servando Snare MD Electronically signed by Servando Snare MD on 05/08/2018 at 6:00:08 PM.    Final     Labs:  CBC: Recent Labs    05/09/18 0429 05/10/18 0906 05/11/18 0321 05/12/18 0338  WBC 16.2* 13.8* 11.7* 12.6*  HGB 7.9* 7.9* 7.8* 7.7*  HCT 24.9* 25.9* 24.1* 24.5*  PLT 341 322 313 324    COAGS: Recent Labs    04/06/18 1952 04/23/18 0329  INR 1.22 1.25  APTT 33  --     BMP: Recent Labs    05/08/18 0349 05/09/18 0429 05/10/18 0906 05/11/18 0321  NA 138 136 138 136  K 3.6 3.3* 3.7 3.6  CL 99 101 99 99  CO2 28 28 26 26   GLUCOSE 121* 90 95 88  BUN 49* 32* 22 23  CALCIUM 7.9* 7.8* 7.8* 7.8*  CREATININE 1.18* 1.00 1.00 0.93  GFRNONAA 46* 56* 56* >60  GFRAA 53* >60 >60 >60    LIVER FUNCTION TESTS: Recent Labs    04/28/18 0305 05/01/18 0510 05/05/18 0351 05/07/18 0348  BILITOT 0.4 0.5 0.4 0.6  AST 30 36 64* 44*  ALT 17 21 47* 42  ALKPHOS 54 70 93 101  PROT 4.5* 5.1* 4.9* 5.0*  ALBUMIN 1.7* 1.8* 1.7* 1.8*    Assessment and Plan:  Patient with history of diverticular abscess and multiple drains placed in IR - RLQ drain to GB remains for IR, other drains managed by surgical team. RLQ drain with scant output currently - most recent CT 11/27 shows new fluid collections which are not amenable to percutaneous drainage.   Further plans per [facility]. IR will continue to follow RLQ drain.   Electronically Signed: Joaquim Nam, PA-C 05/12/2018, 12:20 PM   I spent a total of 15 Minutes at the the patient's bedside AND on the patient's hospital floor or unit, greater than 50% of which was counseling/coordinating care for abdominal  abscess drain.

## 2018-05-13 ENCOUNTER — Inpatient Hospital Stay
Admission: RE | Admit: 2018-05-13 | Discharge: 2018-06-06 | Disposition: A | Discharge status: Home / Self Care | Payer: Medicare Other | Source: Ambulatory Visit | Attending: Internal Medicine | Admitting: Internal Medicine

## 2018-05-13 DIAGNOSIS — I739 Peripheral vascular disease, unspecified: Secondary | ICD-10-CM | POA: Diagnosis not present

## 2018-05-13 DIAGNOSIS — Z452 Encounter for adjustment and management of vascular access device: Secondary | ICD-10-CM | POA: Diagnosis not present

## 2018-05-13 DIAGNOSIS — S199XXA Unspecified injury of neck, initial encounter: Secondary | ICD-10-CM | POA: Diagnosis not present

## 2018-05-13 DIAGNOSIS — N179 Acute kidney failure, unspecified: Secondary | ICD-10-CM | POA: Diagnosis present

## 2018-05-13 DIAGNOSIS — M6281 Muscle weakness (generalized): Secondary | ICD-10-CM | POA: Diagnosis not present

## 2018-05-13 DIAGNOSIS — Z8261 Family history of arthritis: Secondary | ICD-10-CM | POA: Diagnosis not present

## 2018-05-13 DIAGNOSIS — T82598A Other mechanical complication of other cardiac and vascular devices and implants, initial encounter: Secondary | ICD-10-CM | POA: Diagnosis not present

## 2018-05-13 DIAGNOSIS — Z6836 Body mass index (BMI) 36.0-36.9, adult: Secondary | ICD-10-CM | POA: Diagnosis not present

## 2018-05-13 DIAGNOSIS — R790 Abnormal level of blood mineral: Secondary | ICD-10-CM | POA: Diagnosis not present

## 2018-05-13 DIAGNOSIS — K5780 Diverticulitis of intestine, part unspecified, with perforation and abscess without bleeding: Secondary | ICD-10-CM | POA: Diagnosis not present

## 2018-05-13 DIAGNOSIS — R000 Tachycardia, unspecified: Secondary | ICD-10-CM | POA: Diagnosis not present

## 2018-05-13 DIAGNOSIS — M797 Fibromyalgia: Secondary | ICD-10-CM | POA: Diagnosis present

## 2018-05-13 DIAGNOSIS — E1122 Type 2 diabetes mellitus with diabetic chronic kidney disease: Secondary | ICD-10-CM | POA: Diagnosis present

## 2018-05-13 DIAGNOSIS — Y832 Surgical operation with anastomosis, bypass or graft as the cause of abnormal reaction of the patient, or of later complication, without mention of misadventure at the time of the procedure: Secondary | ICD-10-CM | POA: Diagnosis not present

## 2018-05-13 DIAGNOSIS — I25708 Atherosclerosis of coronary artery bypass graft(s), unspecified, with other forms of angina pectoris: Secondary | ICD-10-CM | POA: Diagnosis not present

## 2018-05-13 DIAGNOSIS — G459 Transient cerebral ischemic attack, unspecified: Secondary | ICD-10-CM | POA: Diagnosis present

## 2018-05-13 DIAGNOSIS — Z95828 Presence of other vascular implants and grafts: Secondary | ICD-10-CM | POA: Diagnosis not present

## 2018-05-13 DIAGNOSIS — E1121 Type 2 diabetes mellitus with diabetic nephropathy: Secondary | ICD-10-CM | POA: Diagnosis not present

## 2018-05-13 DIAGNOSIS — Y92122 Bedroom in nursing home as the place of occurrence of the external cause: Secondary | ICD-10-CM | POA: Diagnosis not present

## 2018-05-13 DIAGNOSIS — Z8673 Personal history of transient ischemic attack (TIA), and cerebral infarction without residual deficits: Secondary | ICD-10-CM | POA: Diagnosis not present

## 2018-05-13 DIAGNOSIS — G8929 Other chronic pain: Secondary | ICD-10-CM | POA: Diagnosis present

## 2018-05-13 DIAGNOSIS — M069 Rheumatoid arthritis, unspecified: Secondary | ICD-10-CM | POA: Diagnosis present

## 2018-05-13 DIAGNOSIS — R0902 Hypoxemia: Secondary | ICD-10-CM | POA: Diagnosis not present

## 2018-05-13 DIAGNOSIS — F419 Anxiety disorder, unspecified: Secondary | ICD-10-CM | POA: Diagnosis not present

## 2018-05-13 DIAGNOSIS — E739 Lactose intolerance, unspecified: Secondary | ICD-10-CM | POA: Diagnosis not present

## 2018-05-13 DIAGNOSIS — N200 Calculus of kidney: Secondary | ICD-10-CM | POA: Diagnosis not present

## 2018-05-13 DIAGNOSIS — Z87898 Personal history of other specified conditions: Principal | ICD-10-CM

## 2018-05-13 DIAGNOSIS — I2510 Atherosclerotic heart disease of native coronary artery without angina pectoris: Secondary | ICD-10-CM | POA: Diagnosis present

## 2018-05-13 DIAGNOSIS — R262 Difficulty in walking, not elsewhere classified: Secondary | ICD-10-CM | POA: Diagnosis not present

## 2018-05-13 DIAGNOSIS — Y92129 Unspecified place in nursing home as the place of occurrence of the external cause: Secondary | ICD-10-CM | POA: Diagnosis not present

## 2018-05-13 DIAGNOSIS — E785 Hyperlipidemia, unspecified: Secondary | ICD-10-CM | POA: Diagnosis not present

## 2018-05-13 DIAGNOSIS — Z951 Presence of aortocoronary bypass graft: Secondary | ICD-10-CM | POA: Diagnosis not present

## 2018-05-13 DIAGNOSIS — R160 Hepatomegaly, not elsewhere classified: Secondary | ICD-10-CM | POA: Diagnosis not present

## 2018-05-13 DIAGNOSIS — Z23 Encounter for immunization: Secondary | ICD-10-CM | POA: Diagnosis not present

## 2018-05-13 DIAGNOSIS — I4891 Unspecified atrial fibrillation: Secondary | ICD-10-CM | POA: Diagnosis not present

## 2018-05-13 DIAGNOSIS — Z48815 Encounter for surgical aftercare following surgery on the digestive system: Secondary | ICD-10-CM | POA: Diagnosis not present

## 2018-05-13 DIAGNOSIS — K9409 Other complications of colostomy: Secondary | ICD-10-CM | POA: Diagnosis not present

## 2018-05-13 DIAGNOSIS — I480 Paroxysmal atrial fibrillation: Secondary | ICD-10-CM | POA: Diagnosis present

## 2018-05-13 DIAGNOSIS — I82621 Acute embolism and thrombosis of deep veins of right upper extremity: Secondary | ICD-10-CM | POA: Diagnosis not present

## 2018-05-13 DIAGNOSIS — W01198A Fall on same level from slipping, tripping and stumbling with subsequent striking against other object, initial encounter: Secondary | ICD-10-CM | POA: Diagnosis not present

## 2018-05-13 DIAGNOSIS — J189 Pneumonia, unspecified organism: Secondary | ICD-10-CM | POA: Diagnosis not present

## 2018-05-13 DIAGNOSIS — Z7901 Long term (current) use of anticoagulants: Secondary | ICD-10-CM | POA: Diagnosis not present

## 2018-05-13 DIAGNOSIS — R29898 Other symptoms and signs involving the musculoskeletal system: Secondary | ICD-10-CM | POA: Diagnosis not present

## 2018-05-13 DIAGNOSIS — Z9049 Acquired absence of other specified parts of digestive tract: Secondary | ICD-10-CM | POA: Diagnosis not present

## 2018-05-13 DIAGNOSIS — D631 Anemia in chronic kidney disease: Secondary | ICD-10-CM | POA: Diagnosis not present

## 2018-05-13 DIAGNOSIS — E1169 Type 2 diabetes mellitus with other specified complication: Secondary | ICD-10-CM | POA: Diagnosis not present

## 2018-05-13 DIAGNOSIS — S0990XA Unspecified injury of head, initial encounter: Secondary | ICD-10-CM | POA: Diagnosis not present

## 2018-05-13 DIAGNOSIS — E1151 Type 2 diabetes mellitus with diabetic peripheral angiopathy without gangrene: Secondary | ICD-10-CM | POA: Diagnosis present

## 2018-05-13 DIAGNOSIS — N183 Chronic kidney disease, stage 3 (moderate): Secondary | ICD-10-CM | POA: Diagnosis present

## 2018-05-13 DIAGNOSIS — K219 Gastro-esophageal reflux disease without esophagitis: Secondary | ICD-10-CM | POA: Diagnosis present

## 2018-05-13 DIAGNOSIS — Z7902 Long term (current) use of antithrombotics/antiplatelets: Secondary | ICD-10-CM | POA: Diagnosis not present

## 2018-05-13 DIAGNOSIS — A419 Sepsis, unspecified organism: Secondary | ICD-10-CM | POA: Diagnosis not present

## 2018-05-13 DIAGNOSIS — M545 Low back pain: Secondary | ICD-10-CM | POA: Diagnosis not present

## 2018-05-13 DIAGNOSIS — I129 Hypertensive chronic kidney disease with stage 1 through stage 4 chronic kidney disease, or unspecified chronic kidney disease: Secondary | ICD-10-CM | POA: Diagnosis present

## 2018-05-13 DIAGNOSIS — Y999 Unspecified external cause status: Secondary | ICD-10-CM | POA: Diagnosis not present

## 2018-05-13 DIAGNOSIS — Z794 Long term (current) use of insulin: Secondary | ICD-10-CM | POA: Diagnosis not present

## 2018-05-13 DIAGNOSIS — E876 Hypokalemia: Secondary | ICD-10-CM | POA: Diagnosis not present

## 2018-05-13 DIAGNOSIS — K5730 Diverticulosis of large intestine without perforation or abscess without bleeding: Secondary | ICD-10-CM | POA: Diagnosis not present

## 2018-05-13 DIAGNOSIS — M2550 Pain in unspecified joint: Secondary | ICD-10-CM | POA: Diagnosis not present

## 2018-05-13 DIAGNOSIS — W19XXXA Unspecified fall, initial encounter: Secondary | ICD-10-CM | POA: Diagnosis not present

## 2018-05-13 DIAGNOSIS — Z7401 Bed confinement status: Secondary | ICD-10-CM | POA: Diagnosis not present

## 2018-05-13 DIAGNOSIS — F329 Major depressive disorder, single episode, unspecified: Secondary | ICD-10-CM | POA: Diagnosis not present

## 2018-05-13 DIAGNOSIS — R1084 Generalized abdominal pain: Secondary | ICD-10-CM | POA: Diagnosis not present

## 2018-05-13 DIAGNOSIS — E7800 Pure hypercholesterolemia, unspecified: Secondary | ICD-10-CM | POA: Diagnosis present

## 2018-05-13 DIAGNOSIS — Z87891 Personal history of nicotine dependence: Secondary | ICD-10-CM | POA: Diagnosis not present

## 2018-05-13 DIAGNOSIS — G4730 Sleep apnea, unspecified: Secondary | ICD-10-CM | POA: Diagnosis present

## 2018-05-13 DIAGNOSIS — I10 Essential (primary) hypertension: Secondary | ICD-10-CM | POA: Diagnosis not present

## 2018-05-13 DIAGNOSIS — Z932 Ileostomy status: Secondary | ICD-10-CM | POA: Diagnosis not present

## 2018-05-13 DIAGNOSIS — Z432 Encounter for attention to ileostomy: Secondary | ICD-10-CM | POA: Diagnosis not present

## 2018-05-13 DIAGNOSIS — K5720 Diverticulitis of large intestine with perforation and abscess without bleeding: Secondary | ICD-10-CM | POA: Diagnosis not present

## 2018-05-13 DIAGNOSIS — Z7952 Long term (current) use of systemic steroids: Secondary | ICD-10-CM | POA: Diagnosis not present

## 2018-05-13 DIAGNOSIS — I639 Cerebral infarction, unspecified: Secondary | ICD-10-CM | POA: Diagnosis not present

## 2018-05-13 DIAGNOSIS — E860 Dehydration: Secondary | ICD-10-CM | POA: Diagnosis not present

## 2018-05-13 DIAGNOSIS — R531 Weakness: Secondary | ICD-10-CM | POA: Diagnosis not present

## 2018-05-13 DIAGNOSIS — W19XXXD Unspecified fall, subsequent encounter: Secondary | ICD-10-CM | POA: Diagnosis not present

## 2018-05-13 DIAGNOSIS — Z8249 Family history of ischemic heart disease and other diseases of the circulatory system: Secondary | ICD-10-CM | POA: Diagnosis not present

## 2018-05-13 DIAGNOSIS — G4733 Obstructive sleep apnea (adult) (pediatric): Secondary | ICD-10-CM | POA: Diagnosis not present

## 2018-05-13 DIAGNOSIS — D638 Anemia in other chronic diseases classified elsewhere: Secondary | ICD-10-CM | POA: Diagnosis present

## 2018-05-13 DIAGNOSIS — Z79899 Other long term (current) drug therapy: Secondary | ICD-10-CM | POA: Diagnosis not present

## 2018-05-13 DIAGNOSIS — Y9389 Activity, other specified: Secondary | ICD-10-CM | POA: Diagnosis not present

## 2018-05-13 DIAGNOSIS — R5383 Other fatigue: Secondary | ICD-10-CM | POA: Diagnosis present

## 2018-05-13 LAB — COMPREHENSIVE METABOLIC PANEL
ALT: 16 U/L (ref 0–44)
AST: 22 U/L (ref 15–41)
Albumin: 1.6 g/dL — ABNORMAL LOW (ref 3.5–5.0)
Alkaline Phosphatase: 81 U/L (ref 38–126)
Anion gap: 11 (ref 5–15)
BUN: 16 mg/dL (ref 8–23)
CHLORIDE: 103 mmol/L (ref 98–111)
CO2: 23 mmol/L (ref 22–32)
Calcium: 7.9 mg/dL — ABNORMAL LOW (ref 8.9–10.3)
Creatinine, Ser: 0.92 mg/dL (ref 0.44–1.00)
GFR calc Af Amer: >60 mL/min (ref >60)
Glucose, Bld: 85 mg/dL (ref 70–99)
Potassium: 3.7 mmol/L (ref 3.5–5.1)
Sodium: 137 mmol/L (ref 135–145)
Total Bilirubin: 0.6 mg/dL (ref 0.3–1.2)
Total Protein: 4.9 g/dL — ABNORMAL LOW (ref 6.5–8.1)

## 2018-05-13 LAB — CBC
HCT: 24.4 % — ABNORMAL LOW (ref 36.0–46.0)
Hemoglobin: 7.5 g/dL — ABNORMAL LOW (ref 12.0–15.0)
MCH: 29.2 pg (ref 26.0–34.0)
MCHC: 30.7 g/dL (ref 30.0–36.0)
MCV: 94.9 fL (ref 80.0–100.0)
Platelets: 368 10*3/uL (ref 150–400)
RBC: 2.57 MIL/uL — ABNORMAL LOW (ref 3.87–5.11)
RDW: 19.1 % — ABNORMAL HIGH (ref 11.5–15.5)
WBC: 10.5 10*3/uL (ref 4.0–10.5)
nRBC: 1.5 % — ABNORMAL HIGH (ref 0.0–0.2)

## 2018-05-13 LAB — HEPARIN LEVEL (UNFRACTIONATED): Heparin Unfractionated: 0.1 IU/mL — ABNORMAL LOW (ref 0.30–0.70)

## 2018-05-13 LAB — GLUCOSE, CAPILLARY
Glucose-Capillary: 94 mg/dL (ref 70–99)
Glucose-Capillary: 120 mg/dL — ABNORMAL HIGH (ref 70–99)

## 2018-05-13 LAB — MAGNESIUM: MAGNESIUM: 1.5 mg/dL — AB (ref 1.7–2.4)

## 2018-05-13 MED ORDER — HEPARIN SOD (PORK) LOCK FLUSH 100 UNIT/ML IV SOLN
250.0000 [IU] | INTRAVENOUS | Status: AC | PRN
  Administered 2018-05-13: 250 [IU]

## 2018-05-13 MED ORDER — WARFARIN SODIUM 2 MG PO TABS: 2.0000 mg | ORAL_TABLET | Freq: Every day | ORAL | Status: DC

## 2018-05-13 MED ORDER — ENOXAPARIN SODIUM 120 MG/0.8ML ~~LOC~~ SOLN
110.0000 mg | Freq: Two times a day (BID) | SUBCUTANEOUS | Status: DC
  Administered 2018-05-13: 110 mg via SUBCUTANEOUS
  Filled 2018-05-13 (×3): qty 0.73

## 2018-05-13 MED ORDER — VANCOMYCIN IV (FOR PTA / DISCHARGE USE ONLY): 1250.0000 mg | Freq: Two times a day (BID) | INTRAVENOUS | 0 refills | Status: DC

## 2018-05-13 MED ORDER — HYDROCODONE-ACETAMINOPHEN 10-325 MG PO TABS: 1.0000 | ORAL_TABLET | Freq: Four times a day (QID) | ORAL | 0 refills | Status: DC

## 2018-05-13 MED ORDER — FLUCONAZOLE 200 MG PO TABS: 400.0000 mg | ORAL_TABLET | Freq: Every day | ORAL | Status: DC

## 2018-05-13 MED ORDER — INSULIN ASPART 100 UNIT/ML ~~LOC~~ SOLN: SUBCUTANEOUS | 12 refills | Status: DC

## 2018-05-13 MED ORDER — MAGNESIUM SULFATE 2 GM/50ML IV SOLN
2.0000 g | Freq: Once | INTRAVENOUS | Status: AC
  Administered 2018-05-13: 2 g via INTRAVENOUS
  Filled 2018-05-13: qty 50

## 2018-05-13 MED ORDER — PIPERACILLIN-TAZOBACTAM IV (FOR PTA / DISCHARGE USE ONLY): 3.3750 g | Freq: Four times a day (QID) | INTRAVENOUS | 0 refills | Status: DC

## 2018-05-13 MED ORDER — DILTIAZEM HCL ER COATED BEADS 120 MG PO CP24: 120.0000 mg | ORAL_CAPSULE | Freq: Every day | ORAL | 0 refills | Status: AC

## 2018-05-13 MED ORDER — ENOXAPARIN SODIUM 120 MG/0.8ML ~~LOC~~ SOLN: 110.0000 mg | Freq: Two times a day (BID) | SUBCUTANEOUS | Status: DC

## 2018-05-13 MED ORDER — FERROUS SULFATE 325 (65 FE) MG PO TABS: 325.0000 mg | ORAL_TABLET | Freq: Two times a day (BID) | ORAL | 3 refills | Status: DC

## 2018-05-13 NOTE — Discharge Instructions (Signed)
Follow with Primary MD [name], MD in 7 days   Get CBC, CMP, Magnesium -  checked  by Primary MD or SNF MD in 5-7 days    Activity: As tolerated with Full fall precautions use walker/cane & assistance as needed  Disposition SNF  Diet: Heart Healthy  Low Carb, check CBGs - [facility]-HS  Special Instructions: If you have smoked or chewed Tobacco  in the last 2 yrs please stop smoking, stop any regular Alcohol  and or any Recreational drug use.  On your next visit with your primary care physician please Get Medicines reviewed and adjusted.  Please request your Prim.MD to go over all Hospital Tests and Procedure/Radiological results at the follow up, please get all Hospital records sent to your Prim MD by signing hospital release before you go home.  If you experience worsening of your admission symptoms, develop shortness of breath, life threatening emergency, suicidal or homicidal thoughts you must seek medical attention immediately by calling 911 or calling your MD immediately  if symptoms less severe.  You Must read complete instructions/literature along with all the possible adverse reactions/side effects for all the Medicines you take and that have been prescribed to you. Take any new Medicines after you have completely understood and accpet all the possible adverse reactions/side effects.       [facility], [place] [phone]  OPEN ABDOMINAL SURGERY: POST OP INSTRUCTIONS  Always review your discharge instruction sheet given to you by the facility where your surgery was performed.  IF YOU HAVE DISABILITY OR FAMILY LEAVE FORMS, YOU MUST BRING THEM TO THE OFFICE FOR PROCESSING.  PLEASE DO NOT GIVE THEM TO YOUR DOCTOR.  1. A prescription for pain medication may be given to you upon discharge.  Take your pain medication as prescribed, if needed.  If narcotic pain medicine is not needed, then you may take acetaminophen (Tylenol) or ibuprofen (Advil) as needed. 2. Take  your usually prescribed medications unless otherwise directed. 3. If you need a refill on your pain medication, please contact your pharmacy. They will contact our office to request authorization.  Prescriptions will not be filled after 5pm or on week-ends. 4. You should follow a light diet the first few days after arrival home, such as soup and crackers, pudding, etc.unless your doctor has advised otherwise. A high-fiber, low fat diet can be resumed as tolerated.   Be sure to include lots of fluids daily. Most patients will experience some swelling and bruising on the chest and neck area.  Ice packs will help.  Swelling and bruising can take several days to resolve 5. Most patients will experience some swelling and bruising in the area of the incision. Ice pack will help. Swelling and bruising can take several days to resolve..  6. It is common to experience some constipation if taking pain medication after surgery.  Increasing fluid intake and taking a stool softener will usually help or prevent this problem from occurring.  A mild laxative (Milk of Magnesia or Miralax) should be taken according to package directions if there are no bowel movements after 48 hours. 7.  You may have steri-strips (small skin tapes) in place directly over the incision.  These strips should be left on the skin for 7-10 days.  If your surgeon used skin glue on the incision, you may shower in 24 hours.  The glue will flake off over the next 2-3 weeks.  Any sutures or staples will  be removed at the office during your follow-up visit. You may find that a light gauze bandage over your incision may keep your staples from being rubbed or pulled. You may shower and replace the bandage daily. 8. ACTIVITIES:  You may resume regular (light) daily activities beginning the next day--such as daily self-care, walking, climbing stairs--gradually increasing activities as tolerated.  You may have sexual intercourse when it is comfortable.  Refrain  from any heavy lifting or straining until approved by your doctor. a. You may drive when you no longer are taking prescription pain medication, you can comfortably wear a seatbelt, and you can safely maneuver your car and apply brakes b. Return to Work: ___________________________________ 56. You should see your doctor in the office for a follow-up appointment approximately two weeks after your surgery.  Make sure that you call for this appointment within a day or two after you arrive home to insure a convenient appointment time. OTHER INSTRUCTIONS:  _____________________________________________________________ _____________________________________________________________  WHEN TO CALL YOUR DOCTOR: 1. Fever over 101.0 2. Inability to urinate 3. Nausea and/or vomiting 4. Extreme swelling or bruising 5. Continued bleeding from incision. 6. Increased pain, redness, or drainage from the incision. 7. Difficulty swallowing or breathing 8. Muscle cramping or spasms. 9. Numbness or tingling in hands or feet or around lips.  The clinic staff is available to answer your questions during regular business hours.  Please dont hesitate to call and ask to speak to one of the nurses if you have concerns.  For further questions, please visit [email or web address]    [facility] Surgical drains are used to remove extra fluid that normally builds up in a surgical wound after surgery. A surgical drain helps to heal a surgical wound. Different kinds of surgical drains include:  Active drains. These drains use suction to pull drainage away from the surgical wound. Drainage flows through a tube to a container outside of the body. It is important to keep the bulb or the drainage container flat (compressed) at all times, except while you empty it. Flattening the bulb or container creates suction. The two most common types of active drains are bulb drains and Hemovac drains.  Passive  drains. These drains allow fluid to drain naturally, by gravity. Drainage flows through a tube to a bandage (dressing) or a container outside of the body. Passive drains do not need to be emptied. The most common type of passive drain is the Penrose drain.  A drain is placed during surgery. Immediately after surgery, drainage is usually bright red and a little thicker than water. The drainage may gradually turn yellow or pink and become thinner. It is likely that your health care provider will remove the drain when the drainage stops or when the amount decreases to 1-2 Tbsp (15-30 mL) during a 24-hour period. How to care for your surgical drain  Keep the skin around the drain dry and covered with a dressing at all times.  Check your drain area every day for signs of infection. Check for: ? More redness, swelling, or pain. ? Pus or a bad smell. ? Cloudy drainage. Follow instructions from your health care provider about how to take care of your drain and how to change your dressing. Change your dressing at least one time every day. Change it more often if needed to keep the dressing dry. Make sure you: 1. Gather your supplies, including: ? Tape. ? Germ-free cleaning solution (sterile saline). ? Split gauze drain sponge: 4 x 4  inches (10 x 10 cm). ? Gauze square: 4 x 4 inches (10 x 10 cm). 2. Wash your hands with soap and water before you change your dressing. If soap and water are not available, use hand sanitizer. 3. Remove the old dressing. Avoid using scissors to do that. 4. Use sterile saline to clean your skin around the drain. 5. Place the tube through the slit in a drain sponge. Place the drain sponge so that it covers your wound. 6. Place the gauze square or another drain sponge on top of the drain sponge that is on the wound. Make sure the tube is between those layers. 7. Tape the dressing to your skin. 8. If you have an active bulb or Hemovac drain, tape the drainage tube to your skin  1-2 inches (2.5-5 cm) below the place where the tube enters your body. Taping keeps the tube from pulling on any stitches (sutures) that you have. 9. Wash your hands with soap and water. 10. Write down the color of your drainage and how often you change your dressing.  How to empty your active bulb or Hemovac drain 1. Make sure that you have a measuring cup that you can empty your drainage into. 2. Wash your hands with soap and water. If soap and water are not available, use hand sanitizer. 3. Gently move your fingers down the tube while squeezing very lightly. This is called stripping the tube. This clears any drainage, clots, or tissue from the tube. ? Do not pull on the tube. ? You may need to strip the tube several times every day to keep the tube clear. 4. Open the bulb cap or the drain plug. Do not touch the inside of the cap or the bottom of the plug. 5. Empty all of the drainage into the measuring cup. 6. Compress the bulb or the container and replace the cap or the plug. To compress the bulb or the container, squeeze it firmly in the middle while you close the cap or plug the container. 7. Write down the amount of drainage that you have in each 24-hour period. If you have less than 2 Tbsp (30 mL) of drainage during 24 hours, contact your health care provider. 8. Flush the drainage down the toilet. 9. Wash your hands with soap and water. Contact a health care provider if:  You have more redness, swelling, or pain around your drain area.  The amount of drainage that you have is increasing instead of decreasing.  You have pus or a bad smell coming from your drain area.  You have a fever.  You have drainage that is cloudy.  There is a sudden stop or a sudden decrease in the amount of drainage that you have.  Your tube falls out.  Your active draindoes not stay compressedafter you empty it. This information is not intended to replace advice given to you by your health care  provider. Make sure you discuss any questions you have with your health care provider. Document Released: 05/25/2000 Document Revised: 11/03/2015 Document Reviewed: 12/15/2014 Elsevier Interactive Patient Education  2018 [place] should have follow up with interventional radiology regarding right sided drain.

## 2018-05-13 NOTE — Care Management Note (Addendum)
Case Management Note  Patient Details  Name: [name] MRN: [id number] Date of Birth: Oct 21, 1945  Subjective/Objective:  Admitted with acute perforated sigmoid diverticulitis with multiple abscesses.  Hx of  hypertension, hyperlipidemia, dm2, pvd, CVA, OSA, Rheumatoid arthritis. From home with spouse.          -- s/p abdominal abscess drains X2   11/20 EXPLORATORY LAPAROTOMY  LYSIS OF ADHESIONS SMALL BOWEL RESECTION WITH ANASTOMSIS LOOP ILEOSTOMY PLACEMENT OF DRAINS AND WOUND VAC ( Abdomen)   [place]ear[place] Wrightson (Spouse)     [phone]      PCP: Allyn Kenner  Action/Plan: Transition to SNF. CSW managing disposition to facility.  Expected Discharge Date:  05/13/18               Expected Discharge Plan:  Skilled Nursing Facility  In-House Referral:  Clinical Social Work  Discharge planning Services  CM Consult  Post Acute Care Choice:  NA Choice offered to:  NA  DME Arranged:  N/A DME Agency:  NA  HH Arranged:  NA HH Agency:  NA  Status of Service:  Completed, signed off  If discussed at [employer] of Stay Meetings, dates discussed:    Additional Comments:  Sharin Mons, RN 05/13/2018, 10:02 AM

## 2018-05-13 NOTE — Evaluation (Signed)
Occupational Therapy Evaluation Patient Details Name: [name] MRN: [id number] DOB: [birth year] Today's Date: 05/13/2018    History of Present Illness [name]  is a 72 y.o. female with PMH including but not limited to hypertension, hyperlipidemia, dm2, pvd, CVA, OSA, Rheumatoid arthritis with c/o LLQ pain over the past several weeks. Pt found to have acute perforated sigmoid diverticulitis with multiple abscesses. Pt is s/p abdominal abscess drains X2.    Clinical Impression   Pt mod I with ADLs prior to extended hospital admission. Pt now limited by severe abdominal pain, generalized weakness, and reduced functional activity tolerance. Pt transferred to EOB with max A this session before requesting to return supine d/t abdominal pain increasing. Max A +2 all LB ADLs. Recommend d/c to SNF.    Follow Up Recommendations  SNF    Equipment Recommendations  None recommended by OT    Recommendations for Other Services PT consult;Speech consult     Precautions / Restrictions Precautions Precautions: Fall;Other (comment) Precaution Comments: 2 L LQ jp drains, 1 R LQ drain, abdominal wound VAC Restrictions Weight Bearing Restrictions: No      Mobility Bed Mobility Overal bed mobility: Needs Assistance Bed Mobility: Sidelying to Sit;Rolling;Supine to Sit Rolling: Min assist Sidelying to sit: Max assist;HOB elevated Supine to sit: Max assist Sit to supine: Mod assist;+2 for physical assistance   General bed mobility comments: Heavy cueing for log rolling technique to attempt and minimize pain through abdomen  Transfers Overall transfer level: Needs assistance               General transfer comment: pt refused to attempt any transfers this session d/t pain    Balance Overall balance assessment: Needs assistance Sitting-balance support: Feet supported Sitting balance-Leahy Scale: Fair                                     ADL either performed or  assessed with clinical judgement   ADL Overall ADL's : Needs assistance/impaired     Grooming: Wash/dry hands;Wash/dry face;Sitting;Set up   Upper Body Bathing: Bed level;Supervision/ safety   Lower Body Bathing: Maximal assistance;+2 for physical assistance;Sitting/lateral leans   Upper Body Dressing : Moderate assistance;Sitting   Lower Body Dressing: Maximal assistance;+2 for physical assistance;Sitting/lateral leans                       Vision Baseline Vision/History: No visual deficits Patient Visual Report: No change from baseline Vision Assessment?: No apparent visual deficits            Pertinent Vitals/Pain Pain Assessment: 0-10 Pain Score: 8  Pain Location: Abdomen Pain Descriptors / Indicators: Discomfort;Grimacing;Constant Pain Intervention(s): Limited activity within patient's tolerance;Repositioned;Patient requesting pain meds-RN notified     Hand Dominance Right   Extremity/Trunk Assessment Upper Extremity Assessment Upper Extremity Assessment: Generalized weakness   Lower Extremity Assessment Lower Extremity Assessment: Defer to PT evaluation   Cervical / Trunk Assessment Cervical / Trunk Assessment: Normal   Communication Communication Communication: No difficulties   Cognition Arousal/Alertness: Awake/alert Behavior During Therapy: Anxious Overall Cognitive Status: Within Functional Limits for tasks assessed                                 General Comments: Husband present    General Comments  Pt both self limiting and in high amounts of  pain            Home Living Family/patient expects to be discharged to:: Private residence Living Arrangements: Spouse/significant other Available Help at Discharge: Family Type of Home: House             Bathroom Shower/Tub: Tub/shower unit         Home Equipment: Environmental consultant - 4 wheels;Wheelchair - [employer];Shower seat;Cane - single point           Prior Functioning/Environment Level of Independence: Independent with assistive device(s)        Comments: household ambulator with Rollator/cane        OT Problem List: Pain;Increased edema;Obesity;Cardiopulmonary status limiting activity;Decreased knowledge of precautions;Decreased knowledge of use of DME or AE;Decreased safety awareness;Decreased coordination;Impaired balance (sitting and/or standing);Decreased range of motion;Decreased strength;Decreased activity tolerance      OT Treatment/Interventions: Self-care/ADL training;Therapeutic exercise;Energy conservation;Balance training;Patient/family education;Therapeutic activities    OT Goals(Current goals can be found in the care plan section) Acute Rehab OT Goals Patient Stated Goal: "get better at rehab" OT Goal Formulation: With patient/family Time For Goal Achievement: 05/20/18 Potential to Achieve Goals: Fair  OT Frequency: Min 2X/week    AM-PAC OT "6 Clicks" Daily Activity     Outcome Measure Help from another person eating meals?: None Help from another person taking care of personal grooming?: A Little Help from another person toileting, which includes using toliet, bedpan, or urinal?: A Lot Help from another person bathing (including washing, rinsing, drying)?: A Lot Help from another person to put on and taking off regular upper body clothing?: A Little Help from another person to put on and taking off regular lower body clothing?: Total 6 Click Score: 15   End of Session Nurse Communication: Mobility status;Patient requests pain meds  Activity Tolerance: Patient limited by pain Patient left: in bed;with bed alarm set;with family/visitor present;with call bell/phone within reach  OT Visit Diagnosis: Muscle weakness (generalized) (M62.81);Unsteadiness on feet (R26.81)                Time: 1101-1120 OT Time Calculation (min): 19 min Charges:  OT General Charges $OT Visit: 1 Visit OT Evaluation $OT Eval  Moderate Complexity: [place] OTR/L  05/13/2018, 11:42 AM

## 2018-05-13 NOTE — Clinical Social Work Placement (Signed)
   CLINICAL SOCIAL WORK PLACEMENT  NOTE  Date:  05/13/2018  Patient Details  Name: [name] MRN: [id number] Date of Birth: [birth year]  Clinical Social Work is seeking post-discharge placement for this patient at the [place] level of care (*CSW will initial, date and re-position this form in  chart as items are completed):  Yes   Patient/family provided with [place] Work Department's list of facilities offering this level of care within the geographic area requested by the patient (or if unable, by the patient's family).  Yes   Patient/family informed of their freedom to choose among providers that offer the needed level of care, that participate in Medicare, Medicaid or managed care program needed by the patient, have an available bed and are willing to accept the patient.  Yes   Patient/family informed of [facility]'s ownership interest in [facility] and [facility], as well as of the fact that they are under no obligation to receive care at these facilities.  PASRR submitted to EDS on 04/15/18     PASRR number received on 04/15/18     Existing PASRR number confirmed on       FL2 transmitted to all facilities in geographic area requested by pt/family on 04/15/18     FL2 transmitted to all facilities within larger geographic area on       Patient informed that his/her managed care company has contracts with or will negotiate with certain facilities, including the following:        Yes   Patient/family informed of bed offers received.  Patient chooses bed at [facility]     Physician recommends and patient chooses bed at      Patient to be transferred to [facility] on 05/13/18.  Patient to be transferred to facility by [facility]     Patient family notified on 05/13/18 of transfer.  Name of family member notified:  Spouse     PHYSICIAN       Additional Comment:  [facility] aware that patient requires IV  antibiotics.   _______________________________________________ Benard Halsted, LCSW 05/13/2018, 11:01 AM

## 2018-05-13 NOTE — Progress Notes (Signed)
ANTICOAGULATION CONSULT NOTE - Follow Up Consult  Pharmacy Consult for heparin Indication: DVT   Labs: Recent Labs    05/10/18 0906  05/11/18 0321  05/12/18 0338 05/12/18 1605 05/13/18 0412  HGB 7.9*  --  7.8*  --  7.7*  --  7.5*  HCT 25.9*  --  24.1*  --  24.5*  --  24.4*  PLT 322  --  313  --  324  --  368  HEPARINUNFRC 0.54   < > 0.38   < > 0.29* 0.39 0.10*  CREATININE 1.00  --  0.93  --   --   --  0.92   < > = values in this interval not displayed.    Assessment: 72yo female now subtherapeutic on heparin after one level at goal; difficult to obtain therapeutic levels on this pt, plan to switch to LMWH today.  Goal of Therapy:  Heparin level 0.3-0.5 units/ml   Plan:  Will increase heparin gtt by 1-2 units/kg/hr to 1400 units/hr for now and f/u on changing therapy prior to planning labs.    Wynona Neat, PharmD, BCPS  05/13/2018,6:59 AM

## 2018-05-13 NOTE — Discharge Summary (Signed)
[name] RPR:[id number] DOB: 05-Apr-1946 DOA: 04/05/2018  PCP: [name], MD  Admit date: 04/05/2018  Discharge date: 05/13/2018  Admitted From: Home   Disposition:  SNF   Recommendations for Outpatient Follow-up:   Follow up with PCP in 1-2 weeks  PCP Please obtain BMP/CBC, 2 view CXR in 1week,  (see Discharge instructions)   PCP Please follow up on the following pending results: Monitor CBGs, CBC, INR closely.   Home Health:    Equipment/Devices:   Consultations: CCS,ID Discharge Condition: Fair   CODE STATUS: Full   Diet Recommendation: Heart Healthy Low Carb     Chief Complaint  Patient presents with  . Abdominal Pain     Brief history of present illness from the day of admission and additional interim summary    72 year old female with past medical history of hypertension, type 2 diabetes mellitus chronic kidney disease rheumatoid arthritis coronary artery disease S/P CABG on Plavix who was admitted with abdominal pain and found to have sigmoid diverticulitis on April 05, 2018 with perforation and abscess.  Initially was felt to be improving on IV Merrem but she continued to have abdominal pain. CT scan of the abdomen and pelvis was obtained April 15, 2018 and was significant for worsening diverticulitis with abdominal fluid collections, she was transferred to [facility] for IR evaluation, he did receive total of 3 percutaneous drains, with repeat CT abdomen pelvis 04/27/2018, still showing remaining fluid collection/abscess despite he strains, and appropriate antibiotic coverage, patient went for exploratory laparotomy with lysis of adhesion, small bowel resection with loop ileostomy, and placement of wound VAC by Dr. Georgette Dover 04/30/2018.         Hospital Course   Perforated sigmoid diverticulitis/abdominal abscess : General surgery following, initially managed conservatively with multiple abdominal drain placements however repeat CT scan on 04/27/2018 showed worsening of abscess and disease process after which she was taken to the OR for laparotomy with lysis of adhesion, small bowel resection, loop ileostomy and placement of wound VAC by Dr. Georgette Dover on 04/30/2018.  Abscess cultures noted which are growing Candida, Clostridium along with Peptostreptococcus and now ampicillin resistant Enterococcus.  She had originally finished 4 weeks of meropenem.  [place] on IV Zosyn, IV Vancomycin and PO Diflucan ( for another 4 weeks).  Close surgery and ID follow-up post discharge within 1 to 2 weeks.  1 of the drains in the left upper quadrant is draining feculent material suggesting development of fistula which surgically is aware of, but currently no surgical intervention at this time.  Defer management of this complex abdominal issue to general surgery.  Have discussed with general surgery the patient likely will be discharged to SNF today with close outpatient general surgery follow-up.   Acute right upper extremity DVT extremity  - continue full dose anticoagulation, right upper extremity ultrasound was repeated showing continued presence of DVT.  Will place her on Lovenox along with Coumadin overlap.  Request SNF staff to check INR every 2 days and stop Lovenox once INR reaches  2.  Anemia of critical illness on top of baseline anemia of chronic disease and iron deficiency.  Some element of dilution but her ileostomy output appears dark, despite holding PNA and diuresing her with Lasix her H&H is going down suggesting some ongoing blood loss, continue IV PPI, anemia panel was inconclusive she received 1 unit of packed RBC on 05/07/2018 with stabilization of H&H.    Monitor H&H at SNF.  Acute kidney injury with chronic kidney disease stage III  -  recent creatinine of 1.2, ARF resolved currently close to baseline.  Fibromyalgia - continue Lyrica able to take oral  Hypertension improved.  - Blood pressure stable on low-dose Cardizem.  Advanced weakness and deconditioning -   PT has been consulted they recommend SNF.  Patient encouraged to sit up in the chair rather than laying in bed.  Counseled patient and husband multiple times including 05/07/2018, 05/08/2018.  Depression patient recently started on Lexapro  Paroxysmal atrial tachycardia.  She has not had any more episodes of PAT since that of April 22, 2018 echocardiogram that was done during showed preserved ejection fraction she has stable TSH, on low-dose Cardizem will adjust as blood pressure permits, he currently remains rate controlled.  Hypomagnesemia - replaced IV, recheck in am at SNF.  Type 2 diabetes mellitus controlled  - Continue with insulin sliding scale every 4 hours CBGs QA CHS and adjust at SNF.Marland Kitchen     Discharge diagnosis     Principal Problem:   Diverticulitis of large intestine with perforation and abscess Active Problems:   DM (diabetes mellitus) (HCC)   HTN (hypertension)   Rheumatoid arthritis ([place])   Anemia   Renal insufficiency   Sepsis due to undetermined organism ([place])   CKD (chronic kidney disease) stage 3, GFR 30-59 ml/min (HCC)   Fibromyalgia   Acute renal failure superimposed on stage 3 chronic kidney disease (HCC)   Abscess   Abscess of sigmoid colon due to diverticulitis    Discharge instructions    Discharge Instructions    Discharge instructions   Complete by:  As directed    Follow with Primary MD [name], MD in 7 days   Get CBC, CMP, Magnesium -  checked  by Primary MD or SNF MD in 5-7 days    Activity: As tolerated with Full fall precautions use walker/cane & assistance as needed  Disposition SNF  Diet: Heart Healthy  Low Carb, check CBGs - QAC-HS  Special Instructions: If you have smoked or chewed  Tobacco  in the last 2 yrs please stop smoking, stop any regular Alcohol  and or any Recreational drug use.  On your next visit with your primary care physician please Get Medicines reviewed and adjusted.  Please request your Prim.MD to go over all Hospital Tests and Procedure/Radiological results at the follow up, please get all Hospital records sent to your Prim MD by signing hospital release before you go home.  If you experience worsening of your admission symptoms, develop shortness of breath, life threatening emergency, suicidal or homicidal thoughts you must seek medical attention immediately by calling 911 or calling your MD immediately  if symptoms less severe.  You Must read complete instructions/literature along with all the possible adverse reactions/side effects for all the Medicines you take and that have been prescribed to you. Take any new Medicines after you have completely understood and accpet all the possible adverse reactions/side effects.   Home infusion instructions [facility] May follow Center  Protocol; May administer Cathflo as needed to maintain patency of vascular access device.; Flushing of vascular access device: per [facility] Protocol: 0.9% NaCl pre/post medica...   Complete by:  As directed    Instructions:  May follow [facility] Protocol   Instructions:  May administer Cathflo as needed to maintain patency of vascular access device.   Instructions:  Flushing of vascular access device: per [facility] Protocol: 0.9% NaCl pre/post medication administration and prn patency; Heparin 100 u/ml, 87m for implanted ports and Heparin 10u/ml, 570mfor all other central venous catheters.   Instructions:  May follow [facility] Anaphylaxis Protocol for First Dose Administration in the home: 0.9% NaCl at 25-50 ml/hr to maintain IV access for protocol meds. Epinephrine 0.3 ml IV/IM PRN and Benadryl 25-50 IV/IM PRN s/s of anaphylaxis.   Instructions:  [facility]fusion  Coordinator (RN) to assist per patient IV care needs in the home PRN.   Increase activity slowly   Complete by:  As directed       Discharge Medications   Allergies as of 05/13/2018      Reactions   Lactose Intolerance (gi) Other (See Comments)   G.I. Unice Cobblebuprenorphine] Rash, Other (See Comments)   Infected skin underneath application   Penicillins Rash   Facial rash Has patient had a PCN reaction causing immediate rash, facial/tongue/throat swelling, SOB or lightheadedness with hypotension: Yes Has patient had a PCN reaction causing severe rash involving mucus membranes or skin necrosis: No Has patient had a PCN reaction that required hospitalization No Has patient had a PCN reaction occurring within the last 10 years: Yes If all of the above answers are "NO", then may proceed with Cephalosporin use.   Simvastatin Rash      Medication List    STOP taking these medications   ciprofloxacin 500 MG/5ML (10%) suspension Commonly known as:  CIPRO   glipiZIDE 10 MG tablet Commonly known as:  GLUCOTROL   metroNIDAZOLE 250 MG tablet Commonly known as:  FLAGYL   TRIBENZOR 40-5-25 MG Tabs Generic drug:  Olmesartan-amLODIPine-HCTZ     TAKE these medications   clopidogrel 75 MG tablet Commonly known as:  PLAVIX Take 1 tablet (75 mg total) by mouth daily with breakfast.   Dexlansoprazole 30 MG capsule Take 40 mg by mouth daily.   diclofenac sodium 1 % Gel Commonly known as:  VOLTAREN Apply 2 g topically 4 (four) times daily as needed (Pain).   diltiazem 120 MG 24 hr capsule Commonly known as:  CARDIZEM CD Take 1 capsule (120 mg total) by mouth daily.   docusate sodium 100 MG capsule Commonly known as:  COLACE Take 2 capsules (200 mg total) by mouth at bedtime. What changed:    when to take this  reasons to take this   enoxaparin 120 MG/0.8ML injection Commonly known as:  LOVENOX Inject 0.73 mLs (110 mg total) into the skin every 12 (twelve) hours.     ferrous sulfate 325 (65 FE) MG tablet Take 1 tablet (325 mg total) by mouth 2 (two) times daily with a meal. What changed:    medication strength  how much to take  when to take this   fluconazole 200 MG tablet Commonly known as:  DIFLUCAN Take 2 tablets (400 mg total) by mouth daily. Start taking on:  05/14/2018   FREESTYLE LITE DeKerrin Moee admin instructions.   HYDROcodone-acetaminophen 10-325 MG tablet Commonly known as:  NORCO Take 1 tablet by mouth 4 (four) times daily.  insulin aspart 100 UNIT/ML injection Commonly known as:  novoLOG Before each meal 3 times a day, 140-199 - 2 units, 200-250 - 4 units, 251-299 - 6 units,  300-349 - 8 units,  350 or above 10 units. Dispense syringes and needles as needed, Ok to switch to PEN if approved. Substitute to any brand approved. DX DM2, Code E11.65   isosorbide dinitrate 5 MG tablet Commonly known as:  ISORDIL Take 1 tablet (5 mg total) by mouth 3 (three) times daily before meals.   leflunomide 20 MG tablet Commonly known as:  ARAVA Take 20 mg by mouth daily.   metoprolol succinate 25 MG 24 hr tablet Commonly known as:  TOPROL-XL Take 25 mg by mouth daily.   piperacillin-tazobactam  IVPB Commonly known as:  ZOSYN Inject 3.375 g into the vein every 6 (six) hours. Indication:  Intraabdominal abscess Last Day of Therapy:  06/07/18 Labs - Once weekly:  CBC/D and BMP, Labs - Every other week:  ESR and CRP   pravastatin 40 MG tablet Commonly known as:  PRAVACHOL Take 40 mg by mouth at bedtime.   predniSONE 5 MG tablet Commonly known as:  DELTASONE Take 5 mg by mouth daily with breakfast.   pregabalin 75 MG capsule Commonly known as:  LYRICA Take 75 mg by mouth 3 (three) times daily.   temazepam 30 MG capsule Commonly known as:  RESTORIL Take 30 mg by mouth at bedtime.   timolol 0.5 % ophthalmic solution Commonly known as:  BETIMOL Place 1 drop into both eyes 2 (two) times daily.   vancomycin  IVPB Inject 1,250  mg into the vein every 12 (twelve) hours. Indication:  Intraabdominal abscess Last Day of Therapy:  06/07/18 Labs - _0 /03/19 1019  Follow-up Information    Donnie Mesa, MD. Schedule an appointment as soon as possible for a visit in 1 week(s).   Specialty:  General Surgery Why:   make an appointment in 1 weeks for post-operative follow up. Contact information: [address] [place] [address] [phone]        Tommy Medal, Lavell Islam, MD Follow up on 06/18/2018.   Specialty:  Infectious Diseases Why:  Appointment at 10:30 am. Please arrive 15 minutes early for appointment. Kindly call to reschedule if unable to make this appointment.  Contact information: [address] [address] [phone]        [name], MD. Schedule an appointment as soon as possible for a visit in 1 week(s).   Specialty:  Internal Medicine Contact information: [place] [address] [phone]           Major procedures and Radiology Reports - PLEASE review detailed and final reports thoroughly  -      - Echocardiogram -   Left ventricle: The cavity size was normal. Wall thickness wasincreased in a pattern of moderate LVH. Systolic function was vigorous. The estimated ejection fraction was in the range of 65%to 70%. Indeterminant diastolic function. Wall motion was normal;there were no regional wall motion abnormalities. - Aortic valve: Valve area (VTI): 2.74 cm^2. Valve area (Vmax): 2.58 cm^2. Valve area (Vmean): 2.56 cm^2.  -S/PIR perc drains x 2 - 04/16/18, culture with multiple organisms present/none predominant -S/P3rdIR drain 11/13, culturegrowingFEW CANDIDA TROPICALIS, - Right arm PICC line - had DVT and was remioved -  Left arm PICC line  - Exploratory laparotomy with lysis of adhesion, small bowel resection with loop ileostomy, and placement of wound VAC by Dr. Georgette Dover 04/30/2018.  - CT 11/27 - 1. 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 Diverticular changes throughout decompressed bowel without definite focal inflammation. There is some wall thickening in the sigmoid colon. 2. Existing drainage catheters are not associated with any significant residual fluid collections. 3.  Three separate fluid collections are increasing, as described. 4.  Aortic Atherosclerosis (ICD10-I70.0). 5. Bibasilar airspace disease is likely atelectasis.   Ct Abdomen Pelvis Wo Contrast  Result Date: 04/27/2018 CLINICAL DATA:  Re-evaluate diverticulitis with abscess drainage. EXAM: CT ABDOMEN AND PELVIS WITHOUT CONTRAST TECHNIQUE: Multidetector CT imaging of the abdomen and pelvis was performed following the standard protocol without IV contrast. COMPARISON:  CT scan April 23, 2018 FINDINGS: Lower chest: No acute abnormality. Hepatobiliary: No focal liver abnormality is seen. Status post cholecystectomy. No biliary dilatation. Pancreas: Unremarkable. No pancreatic ductal dilatation or surrounding inflammatory changes. Spleen: Normal in size without focal abnormality. Adrenals/Urinary Tract: There is a tiny nonobstructive stone in the lower pole the right kidney. No hydronephrosis or perinephric stranding. No ureteral stones. The bladder is normal. The adrenal glands are normal. Stomach/Bowel: The stomach is normal. There is a duodenal diverticulum off the distal third portion of the duodenum. The remainder of the small bowel is unremarkable. Colonic diverticuli are noted. A segment of sigmoid colon remains thickened. There is mild increased attenuation in the adjacent fat and some low level remaining diverticulitis is not excluded. Remainder of the colon is normal. The appendix is best seen on coronal images 52 through 57. The appendix is located just superior to a right lower quadrant drain. There is increased attenuation in the fat of the abdomen but it does not appear to arise from the appendix. Vascular/Lymphatic: Atherosclerotic changes  are seen in the abdominal aorta. No adenopathy. Reproductive: Status post hysterectomy. No adnexal masses. Other: There are 3 abscess drainage catheters. The first is seen in the pelvis on axial image 73 with no surrounding fluid. The second is seen in the upper  central pelvis on axial image 59 with no surrounding drainable fluid collection. The third is seen in the right side of the pelvis on image 60. The previously identified fluid and air collection in this region has resolved. The mild fat stranding in this region is thought to be due to from the drainage catheter and recently drained abscess and not arising from the adjacent appendix. Recommend attention on follow-up. There is remaining fluid in the abdomen and pelvis. There is a small pocket of fluid measuring up to 2.8 cm on axial image 43. The distal duodenum courses through this fluid. There is another pocket of fluid in the left lower quadrant of the abdomen seen on axial image 52 measuring 4.8 x 3.7 cm. There is a small extension office fluid collection medially which abuts the drainage catheter. However, this fluid collection is clearly not being drained. Musculoskeletal: No acute or significant osseous findings. IMPRESSION: 1. There are 3 drainage catheters in the lower abdomen and pelvis. No significant fluid around any of the drainage catheters. However, there are 2 remaining fluid collections/abscesses. The first is in the region of the distal third portion of the duodenum measuring up to 2.8 cm. Duodenum appears to run through or immediately adjacent to this fluid. Another pocket of fluid is seen in the left lower quadrant of the abdomen measuring 4.8 x 3.7 cm. 2. Nonobstructive stone in the lower pole the right kidney. 3. Thickening remains in the sigmoid colon. Low-grade remaining inflammation of the sigmoid colon from diverticulitis is not excluded. 4. There is fat stranding in the region of the appendix. However, there is a adjacent drainage catheter and I suspect the stranding is secondary to the recent abscess in this region. Recommend clinical correlation and attention on follow-up. Electronically Signed   By: Dorise Bullion III M.D   On: 04/27/2018 22:51   Ct Abdomen Pelvis Wo Contrast  Result  Date: 04/22/2018 CLINICAL DATA:  Follow-up diverticulitis EXAM: CT ABDOMEN AND PELVIS WITHOUT CONTRAST TECHNIQUE: Multidetector CT imaging of the abdomen and pelvis was performed following the standard protocol without IV contrast. Sagittal and coronal MPR images reconstructed from axial data set. Patient drank dilute oral contrast for exam COMPARISON:  04/15/2018 FINDINGS: Lower chest: Bibasilar atelectasis and tiny pleural effusions. Calcified granulomata at lung bases. Hepatobiliary: Gallbladder surgically absent. No focal hepatic abnormalities. Pancreas: Normal appearance Spleen: Normal appearance Adrenals/Urinary Tract: Adrenal glands normal appearance. Tiny nonobstructing calculus at inferior pole LEFT kidney. Kidneys, ureters, and bladder otherwise normal appearance. Stomach/Bowel: Stomach under distended with suboptimal assessment of wall thickness. Diffuse diverticulosis of the transverse, descending, and sigmoid colon. Persistent wall thickening of the sigmoid colon. Wall thickening also identified at the cecum extending to the ileocecal valve, slightly increased since previous study. No evidence of bowel obstruction. Small bowel loops unremarkable. Vascular/Lymphatic: Atherosclerotic calcifications aorta and iliac arteries. No adenopathy Reproductive: Uterus unremarkable.  Ovaries poorly visualized. Other: Pigtail drainage catheter in pelvis with minimal residual fluid at the previously identified large pelvic abscess which was drained. Pigtail drainage catheter within the mesentery in the mid abdomen with minimal residual fluid within the previously seen abscess collection. This residual fluid extends to a second persistent collection of fluid within the mesentery adjacent to a small bowel  loop, consistent with residual abscess, 4.4 x 3.5 cm previously 5.1 x 3.6 cm. Additional large gas and minimal fluid containing collection in the RIGHT pelvis medial and inferior to the cecum measures 8.2 x 5.0 x 6.8  cm previously 8.2 x 5.9 x 8.0 cm. No new abscess collections. No free intraperitoneal air. Stranding of tissue planes in the pelvis again identified with minimal fluid at the LEFT pericolic gutter and perisplenic. Musculoskeletal: No acute osseous findings. IMPRESSION: Interval resolution of large pelvic abscess collection post drainage. Minimal residual mesenteric abscess collection post drainage though the minimal residual fluid appears to communicate with a second collection in the mesentery in the LEFT mid abdomen adjacent to a small bowel loop measuring 4.4 x 3.5 cm slightly decreased since previous exam. Persistent large extraluminal gas collection in the RIGHT pelvis 8.2 x 5.0 x 6.8 cm, only slightly decreased. Diffuse colonic diverticulosis with persistent wall thickening of the sigmoid colon. Electronically Signed   By: Lavonia Dana M.D.   On: 04/22/2018 13:05   Ct Abdomen Pelvis Wo Contrast  Result Date: 04/15/2018 CLINICAL DATA:  Acute lower abdominal pain. EXAM: CT ABDOMEN AND PELVIS WITHOUT CONTRAST TECHNIQUE: Multidetector CT imaging of the abdomen and pelvis was performed following the standard protocol without IV contrast. COMPARISON:  CT scan of April 06, 2018. FINDINGS: Lower chest: No acute abnormality. Hepatobiliary: No focal liver abnormality is seen. Status post cholecystectomy. No biliary dilatation. Pancreas: Unremarkable. No pancreatic ductal dilatation or surrounding inflammatory changes. Spleen: Normal in size without focal abnormality. Adrenals/Urinary Tract: Adrenal glands appear normal. Small nonobstructive right renal calculus is noted. No hydronephrosis or renal obstruction is noted. Urinary bladder is unremarkable. Stomach/Bowel: The stomach and appendix are unremarkable. Sigmoid diverticulosis is noted. Some degree of residual diverticulitis cannot be excluded. However, 7.3 x 5.8 cm fluid collection is noted in left lower quadrant, which communicates with smaller fluid  collection measuring 5.1 x 3.6 cm. This is concerning for abscess status post previous perforated diverticulitis. Air-fluid collection measuring 8.2 x 5.9 cm is seen in the right lower quadrant also concerning for possible abscess. 8.9 x 5.3 cm fluid and stool collection is seen posterior to the uterus and anterior to the rectum concerning for abscess or contained perforation. Vascular/Lymphatic: Aortic atherosclerosis. No enlarged abdominal or pelvic lymph nodes. Reproductive: Uterus and bilateral adnexa are unremarkable. Other: Stable 6.9 x 4.5 cm fluid collection in left inguinal region. This most likely is benign. Musculoskeletal: No acute or significant osseous findings. IMPRESSION: There are at least 3 fluid collections and probable abscesses seen in the lower abdomen and pelvis, following previously perforated diverticulitis. 7.3 x 5.8 cm fluid collection is noted in left lower quadrant, as well as air-fluid collection measuring 8.2 x 5.9 cm seen in the right lower quadrant. Also noted is 8.9 x 5.3 cm fluid collection in the pelvis in the pre rectal space which appears to contain stool and fluid. These results will be called to the ordering clinician or representative by the Radiologist Assistant, and communication documented in the PACS or zVision Dashboard. Small nonobstructive right renal calculus. Aortic Atherosclerosis (ICD10-I70.0). Electronically Signed   By: Marijo Conception, M.D.   On: 04/15/2018 14:10   Ct Abdomen Pelvis W Contrast  Result Date: 05/07/2018 CLINICAL DATA:  Abdominal pain, fever, abscess suspected. Partial bowel obstruction 8 days ago. EXAM: CT ABDOMEN AND PELVIS WITH CONTRAST TECHNIQUE: Multidetector CT imaging of the abdomen and pelvis was performed using the standard protocol following bolus administration of intravenous contrast. CONTRAST:  136m OMNIPAQUE IOHEXOL 300 MG/ML  SOLN COMPARISON:  CTA abdomen pelvis 04/27/2018 FINDINGS: Lower chest: Dependent airspace disease is  worse right than left. Calcified granulomas are present at the left base. No other significant airspace disease is present. Heart size is normal. No significant pleural or pericardial effusion is present. Hepatobiliary: There is diffuse fatty infiltration of liver. Cholecystectomy is noted. No focal hepatic lesions are present. The common bile duct is within normal limits. Pancreas: The pancreas is atrophic. No discrete lesions are present Spleen: Normal in size without focal abnormality. Adrenals/Urinary Tract: Adrenal glands are normal bilaterally. A 3 mm nonobstructing stone is stable at the lower pole of the right kidney. No other significant stones are present. There is no mass lesion. Minimal fluid is present about both kidneys. Ureters are within normal limits. The urinary bladder is within normal limits. Stomach/Bowel: The stomach and duodenum are within normal limits. The proximal small bowel demonstrates some inflammatory change. There is fluid within the small bowel mesentery in some wall thickening. Right lower quadrant ileostomy demonstrates contrast material. No obstruction is present. The terminal ileum is decompressed. Cecum and ascending colon are mostly decompressed. There are some diverticular changes within the transverse colon. Additional diverticular changes are present in the descending and sigmoid colon. Sigmoid colon is mostly collapsed. A pigtail drainage catheter in the right lower quadrant is stable. There is no significant fluid collection associated. A straight drain extends into the anatomic pelvis via a left lower quadrant approach. Enlarging fluid collection along the lateral peritoneal wall and the left lower quadrant now measures 5.2 x 2.0 x 2.6 cm. A fluid collection along the distal duodenum is increased in size, now measuring 3.5 x 2.2 x 5.0 cm. The collection along the pharynx limb of the loop ileostomy in the right lower quadrant measures 2.0 by 3.2 x 7.1 cm. There is some  free fluid without other focal collections. Vascular/Lymphatic: Atherosclerotic calcifications are present in the aorta and branch vessels without aneurysm. No significant adenopathy is present. Reproductive: Uterus and bilateral adnexa are unremarkable. Other: Laparotomy wound is healing by secondary intention. The deep portion of the wound is closed. No significant ventral hernias are present. Fluid is described above. Musculoskeletal: Degenerative anterolisthesis is again noted at L4-5. Multilevel facet degenerative changes are noted. Vertebral body heights are normal. Hips are located and within normal limits. Pelvis is unremarkable. IMPRESSION: 1. 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 Diverticular changes throughout decompressed bowel without definite focal inflammation. There is some wall thickening in the sigmoid colon. 2. Existing drainage catheters are not associated with any significant residual fluid collections. 3. Three separate fluid collections are increasing, as described. 4.  Aortic Atherosclerosis (ICD10-I70.0). 5. Bibasilar airspace disease is likely atelectasis. Electronically Signed   By: CSan MorelleM.D.   On: 05/07/2018 13:54   [place] Img Upper Uni Right  Result Date: 04/15/2018 CLINICAL DATA:  None EXAM: RIGHT UPPER EXTREMITY VENOUS DOPPLER ULTRASOUND TECHNIQUE: Gray-scale sonography with graded compression, as well as color Doppler and duplex ultrasound were performed to evaluate the upper extremity deep venous system from the level of the subclavian vein and including the jugular, axillary, basilic, radial, ulnar and upper cephalic vein. Spectral Doppler was utilized to evaluate flow at rest and with distal augmentation maneuvers. COMPARISON:  None. FINDINGS: Contralateral Subclavian Vein: Respiratory phasicity is normal and symmetric with the symptomatic side. No evidence of thrombus. Normal compressibility. Internal Jugular Vein: No evidence of  thrombus. Normal compressibility, respiratory phasicity and  response to augmentation. Subclavian Vein: No evidence of thrombus. Normal compressibility, respiratory phasicity and response to augmentation. Axillary Vein: No evidence of thrombus. Normal compressibility, respiratory phasicity and response to augmentation. Cephalic Vein: No evidence of thrombus. Normal compressibility, respiratory phasicity and response to augmentation. Basilic Vein: No evidence of thrombus. Normal compressibility, respiratory phasicity and response to augmentation. Brachial Veins: Occlusive thrombus of the right brachial vein, paired brachial veins. Radial Veins: No evidence of thrombus. Normal compressibility, respiratory phasicity and response to augmentation. Ulnar Veins: No evidence of thrombus. Normal compressibility, respiratory phasicity and response to augmentation. Other Findings:  Edema IMPRESSION: Sonographic survey of the right upper extremity is positive for occlusive DVT of the paired right brachial vein. Electronically Signed   By: Corrie Mckusick D.O.   On: 04/15/2018 14:17   Ct Image Guided Fluid Drain By Catheter  Result Date: 04/16/2018 INDICATION: Pelvic abscess x2 EXAM: PELVIC ABSCESS DRAIN X2 CT-GUIDED MEDICATIONS: The patient is currently admitted to the hospital and receiving intravenous antibiotics. The antibiotics were administered within an appropriate time frame prior to the initiation of the procedure. ANESTHESIA/SEDATION: Fentanyl 0 mcg IV; Versed 4 mg IV, 4 mg morphine sulfate Moderate Sedation Time:  53 minutes The patient was continuously monitored during the procedure by the interventional radiology nurse under my direct supervision. COMPLICATIONS: None immediate. PROCEDURE: Informed written consent was obtained from the patient after a thorough discussion of the procedural risks, benefits and alternatives. All questions were addressed. Maximal Sterile Barrier Technique was utilized including caps,  mask, sterile gowns, sterile gloves, sterile drape, hand hygiene and skin antiseptic. A timeout was performed prior to the initiation of the procedure. In the prone position, the right gluteal region was prepped and draped in a sterile fashion. Under CT guidance, an 18 gauge needle was advanced into the pelvic abscess and removed over an Amplatz wire. Ten [place] dilator followed by a 10 [place] drain were inserted in the fluid collection. Pus was aspirated. The patient was placed supine. The lower abdomen was prepped and draped in a sterile fashion. 1% lidocaine was utilized for local anesthesia. Under CT guidance, an 18 gauge needle was inserted into the upper pelvic fluid collection and removed over an Amplatz wire. Ten [place] dilator followed by 10 [place] drain were inserted. Cloudy yellow fluid was aspirated. FINDINGS: Images demonstrate 25 French drain placement into the 2 fluid collections described above. IMPRESSION: Successful pelvic abscess drain x2 Electronically Signed   By: Marybelle Killings M.D.   On: 04/16/2018 16:01   Ct Image Guided Drainage By Percutaneous Catheter  Result Date: 04/24/2018 CLINICAL DATA:  Diverticular abscess EXAM: CT GUIDED DRAINAGE OF PELVIC ABSCESS ANESTHESIA/SEDATION: Intravenous Fentanyl and Versed were administered as conscious sedation during continuous monitoring of the patient's level of consciousness and physiological / cardiorespiratory status by the radiology RN, with a total moderate sedation time of 17 minutes. PROCEDURE: The procedure, risks, benefits, and alternatives were explained to the patient. Questions regarding the procedure were encouraged and answered. The patient understands and consents to the procedure. Select axial scans through the pelvis were obtained. The right lower quadrant collection was localized, an appropriate skin entry site and trajectory identified, and the skin marked. The operative field was prepped with chlorhexidinein a sterile fashion,  and a sterile drape was applied covering the operative field. A sterile gown and sterile gloves were used for the procedure. Local anesthesia was provided with 1% Lidocaine. Under CT fluoroscopic guidance, an 18 gauge trocar needle was advanced into the collection. Gas  and fluid returned through the needle hub. Amplatz guidewire advanced easily into the collection, confirmed on CT. Tract dilated to facilitate placement of a 12 French pigtail catheter, formed centrally within the collection. Position confirmed on CT. 5 mL of greenish feculent aspirate were sent for Gram stain and culture. The catheter was secured externally with 0 Prolene suture and StatLock and placed to gravity drain bag. The patient tolerated the procedure well. COMPLICATIONS: None immediate FINDINGS: Loculated right lower quadrant gas and fluid collection was localized. 12 French pigtail drain catheter placed as above. IMPRESSION: 1. Technically successful CT-guided placement of right lower quadrant abscess drain catheter. Electronically Signed   By: Lucrezia Europe M.D.   On: 04/24/2018 08:28   Vas [place] Upper Extremity Venous Duplex  Result Date: 05/08/2018 UPPER VENOUS STUDY  Indications: Follow up of DVT found in one of the brachial veins 04/15/18 Performing Technologist: Sharion Dove RVS  Examination Guidelines: A complete evaluation includes B-mode imaging, spectral Doppler, color Doppler, and power Doppler as needed of all accessible portions of each vessel. Bilateral testing is considered an integral part of a complete examination. Limited examinations for reoccurring indications may be performed as noted.  Right Findings: +----------+------------+----------+---------+-----------+-----------------+ RIGHT     CompressiblePropertiesPhasicitySpontaneous     Summary      +----------+------------+----------+---------+-----------+-----------------+ IJV           Full                 Yes       Yes                       +----------+------------+----------+---------+-----------+-----------------+ Subclavian                         Yes       Yes                      +----------+------------+----------+---------+-----------+-----------------+ Axillary      None                                  Age Indeterminate +----------+------------+----------+---------+-----------+-----------------+ Brachial    Partial                                 Age Indeterminate +----------+------------+----------+---------+-----------+-----------------+ Cephalic      Full                                                    +----------+------------+----------+---------+-----------+-----------------+ Basilic       Full                                                    +----------+------------+----------+---------+-----------+-----------------+  Left Findings: +----------+------------+----------+---------+-----------+-------+ LEFT      CompressiblePropertiesPhasicitySpontaneousSummary +----------+------------+----------+---------+-----------+-------+ Subclavian                         Yes       Yes            +----------+------------+----------+---------+-----------+-------+  Summary:  Right:  Findings consistent with age indeterminate deep vein thrombosis involving the right brachial veins and right axillary vein. DVT found in one of the brachial veins, 04/15/18, remains. DVT appears to have propagated in to the axillary vein.  Left: No evidence of thrombosis in the subclavian.  *See table(s) above for measurements and observations.  Diagnosing physician: Servando Snare MD Electronically signed by Servando Snare MD on 05/08/2018 at 6:00:08 PM.    Final    [place] Ekg Site Rite  Result Date: 04/23/2018 If Site Rite image not attached, placement could not be confirmed due to current cardiac rhythm.   Micro Results    Recent Results (from the past 240 hour(s))  Aerobic Culture (superficial specimen)     Status:  None   Collection Time: 05/07/18 10:43 AM  Result Value Ref Range Status   Specimen Description WOUND  Final   Special Requests NONE  Final   Gram Stain   Final    FEW WBC PRESENT, PREDOMINANTLY PMN FEW GRAM POSITIVE COCCI Performed at [facility], [address], [place], [place] [address]    Culture MODERATE ENTEROCOCCUS RAFFINOSUS  Final   Report Status 05/10/2018 FINAL  Final   Organism ID, Bacteria ENTEROCOCCUS RAFFINOSUS  Final      Susceptibility   Enterococcus raffinosus - MIC*    AMPICILLIN 16 RESISTANT Resistant     VANCOMYCIN <=0.5 SENSITIVE Sensitive     GENTAMICIN SYNERGY RESISTANT Resistant     * MODERATE ENTEROCOCCUS RAFFINOSUS    Today   Subjective    Birtha Witcher today has no headache,no chest abdominal pain,no new weakness tingling or numbness, feels much better wants to go home today.    Objective   Blood pressure 119/74, pulse 94, temperature 98.3 F (36.8 C), temperature source Oral, resp. rate 18, height 5' 5.5" (1.664 m), weight 108.7 kg, SpO2 95 %.   Intake/Output Summary (Last 24 hours) at 05/13/2018 1022 Last data filed at 05/13/2018 0956 Gross per 24 hour  Intake 802.91 ml  Output 965 ml  Net -162.09 ml    Exam  Awake Alert, Oriented X 3, No new F.N deficits, Normal affect [place].AT,PERRAL Supple Neck,No JVD, No cervical lymphadenopathy appriciated.  Symmetrical Chest wall movement, Good air movement bilaterally, CTAB RRR,No Gallops, Rubs or new Murmurs, No Parasternal Heave +ve B.Sounds, Abd Soft,  midline surgical wound VAC, right ileostomyileostomy bag, left JP drain with dark greenish material . No Cyanosis, Clubbing or edema, No new Rash or bruise   Data Review   CBC w Diff:  Lab Results  Component Value Date   WBC 10.5 05/13/2018   HGB 7.5 (L) 05/13/2018   HCT 24.4 (L) 05/13/2018   PLT 368 05/13/2018   LYMPHOPCT 5 05/05/2018   MONOPCT 9 05/05/2018   EOSPCT 3 05/05/2018   BASOPCT 1 05/05/2018    CMP:  Lab Results    Component Value Date   NA 137 05/13/2018   K 3.7 05/13/2018   CL 103 05/13/2018   CO2 23 05/13/2018   BUN 16 05/13/2018   CREATININE 0.92 05/13/2018   PROT 4.9 (L) 05/13/2018   ALBUMIN 1.6 (L) 05/13/2018   BILITOT 0.6 05/13/2018   ALKPHOS 81 05/13/2018   AST 22 05/13/2018   ALT 16 05/13/2018  .   Total Time in preparing paper work, data evaluation and todays exam - 26 minutes  Lala Lund M.D on 05/13/2018 at 10:22 AM  [facility]   Office  [phone]

## 2018-05-13 NOTE — Progress Notes (Signed)
Patient will DC to: [facility] Anticipated DC date: 05/13/18 Family notified: Spouse Transport by: Corey Harold   Per MD patient ready for DC to [facility]. RN, patient, patient's family, and facility notified of DC. Discharge Summary and FL2 sent to facility. RN to call report prior to discharge ([phone] Room 144). DC packet on chart. Ambulance transport requested for patient.   CSW will sign off for now as social work intervention is no longer needed. Please consult [place] again if new needs arise.  Cedric Fishman, LCSW Clinical Social Worker [phone]

## 2018-05-13 NOTE — Progress Notes (Signed)
ANTICOAGULATION CONSULT NOTE  Pharmacy Consult for enoxaparin Indication: acute DVT  Allergies  Allergen Reactions  . Lactose Intolerance (Gi) Other (See Comments)    G.I. Upset  . Butrans [Buprenorphine] Rash and Other (See Comments)    Infected skin underneath application  . Penicillins Rash    Facial rash Has patient had a PCN reaction causing immediate rash, facial/tongue/throat swelling, SOB or lightheadedness with hypotension: Yes Has patient had a PCN reaction causing severe rash involving mucus membranes or skin necrosis: No Has patient had a PCN reaction that required hospitalization No Has patient had a PCN reaction occurring within the last 10 years: Yes If all of the above answers are "NO", then may proceed with Cephalosporin use.   . Simvastatin Rash    Patient Measurements: Height: 5' 5.5" (166.4 cm) Weight: 239 lb 10.2 oz (108.7 kg) IBW/kg (Calculated) : 58.15 Heparin Dosing Weight: 85 kg  Vital Signs: Temp: 98.3 F (36.8 C) (12/03 0436) Temp Source: Oral (12/03 0436) BP: 119/74 (12/03 0436) Pulse Rate: 94 (12/03 0940)  Labs: Recent Labs    05/11/18 0321  05/12/18 0338 05/12/18 1605 05/13/18 0412  HGB 7.8*  --  7.7*  --  7.5*  HCT 24.1*  --  24.5*  --  24.4*  PLT 313  --  324  --  368  HEPARINUNFRC 0.38   < > 0.29* 0.39 0.10*  CREATININE 0.93  --   --   --  0.92   < > = values in this interval not displayed.    Estimated Creatinine Clearance: 67.4 mL/min (by C-G formula based on SCr of 0.92 mg/dL).   Medical History: Past Medical History:  Diagnosis Date  . Anginal pain [facility])    sees Dr Einar Gip.   . Arthritis    rheumatoid ..   . Diabetes mellitus without complication (HCC)    Metformin 2.3.2017  . Diverticulitis   . Diverticulitis of large intestine with perforation and abscess 04/05/2018  . Dysrhythmia   . Fibromyalgia   . GERD (gastroesophageal reflux disease)   . High cholesterol   . Hypertension   . Liver disease 08- 2012   per  Dr Dagmar Hait pt has enlarged liver  . Peripheral vascular disease (HCC)    legs  . Pneumonia 06/17/2016  . Sleep apnea    sleep study  oct 2012  . Stroke [facility]) 2011   [facility]      Medications:  Medications Prior to Admission  Medication Sig Dispense Refill Last Dose  . Blood Glucose Monitoring Suppl (FREESTYLE LITE) DEVI See admin instructions.  0 04/05/2018 at Unknown time  . ciprofloxacin (CIPRO) 500 MG/5ML (10%) suspension Take by mouth 2 (two) times daily.   04/05/2018 at Unknown time  . clopidogrel (PLAVIX) 75 MG tablet Take 1 tablet (75 mg total) by mouth daily with breakfast. 90 tablet 6 04/05/2018 at 0800  . Dexlansoprazole 30 MG capsule Take 40 mg by mouth daily.   04/05/2018 at Unknown time  . diclofenac sodium (VOLTAREN) 1 % GEL Apply 2 g topically 4 (four) times daily as needed (Pain).    04/05/2018 at Unknown time  . glipiZIDE (GLUCOTROL) 10 MG tablet Take 10 mg by mouth daily before breakfast.   04/05/2018 at Unknown time  . HYDROcodone-acetaminophen (NORCO) 10-325 MG tablet Take 1 tablet by mouth 4 (four) times daily.   04/05/2018 at Unknown time  . IRON PO Take 1 tablet by mouth daily.   04/05/2018 at Unknown time  . isosorbide dinitrate (  ISORDIL) 5 MG tablet Take 1 tablet (5 mg total) by mouth 3 (three) times daily before meals. 90 tablet 1 04/05/2018 at Unknown time  . leflunomide (ARAVA) 20 MG tablet Take 20 mg by mouth daily.   04/05/2018 at Unknown time  . metoprolol succinate (TOPROL-XL) 25 MG 24 hr tablet Take 25 mg by mouth daily.   04/05/2018 at 0800  . metroNIDAZOLE (FLAGYL) 250 MG tablet Take 250 mg by mouth 3 (three) times daily.   04/05/2018 at Unknown time  . Olmesartan-amLODIPine-HCTZ (TRIBENZOR) 40-5-25 MG TABS Take 1 tablet by mouth daily.    04/05/2018 at Unknown time  . pravastatin (PRAVACHOL) 40 MG tablet Take 40 mg by mouth at bedtime.   04/05/2018 at Unknown time  . predniSONE (DELTASONE) 5 MG tablet Take 5 mg by mouth daily with breakfast.    04/05/2018 at Unknown time  . pregabalin (LYRICA) 75 MG capsule Take 75 mg by mouth 3 (three) times daily.   04/05/2018 at Unknown time  . Tafluprost (ZIOPTAN) 0.0015 % SOLN Place 1 drop into both eyes at bedtime.    04/05/2018 at Unknown time  . temazepam (RESTORIL) 30 MG capsule Take 30 mg by mouth at bedtime.   04/05/2018 at Unknown time  . timolol (BETIMOL) 0.5 % ophthalmic solution Place 1 drop into both eyes 2 (two) times daily.    04/05/2018 at Unknown time  . Tofacitinib Citrate (XELJANZ) 5 MG TABS Take 1 tablet by mouth daily.   04/05/2018 at Unknown time  . VITAMIN D, ERGOCALCIFEROL, PO Take 50,000 Units by mouth once a week. Thursdays   04/05/2018 at Unknown time  . docusate sodium (COLACE) 100 MG capsule Take 2 capsules (200 mg total) by mouth at bedtime. (Patient taking differently: Take 200 mg by mouth as needed. 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 RUE duplex was rechecked which showed DVT remains and propagated.   Per Md, heparin drip will now be changed to enoxaparin and warfarin for discharge.     Goal of Therapy:  INR 2-3  Monitor platelets by anticoagulation protocol: Yes   Plan:   Discontinue heparin drip at 1000 and start enoxaparin 110mg  SQ q12 at 1100 Warfarin per MD on discharge Daily INR   Tuesday Terlecki A. Levada Dy, PharmD, [place] Pager: [phone] Please utilize Amion for appropriate phone number to reach the unit pharmacist ([place])

## 2018-05-13 NOTE — Progress Notes (Signed)
[name] to be D/C'd to [facility] per MD order. Wet-Dry dressing placed on abdominal medial incision once wound vac was removed. 2 Left JP drains and right surgical drain all still in place. Skin clean, dry and intact without evidence of skin break down, no evidence of skin tears noted.  PICC in place.  Patient escorted via stretcher, and D/C to [facility]. Report Called [place]  05/13/2018 3:20 PM

## 2018-05-13 NOTE — Progress Notes (Signed)
[facility]/Trauma Progress Note  13 Days Post-Op   Assessment/Plan HLD 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 - on plavix (last dose 10/27) H/o CVA  Morbidly obese Chronic pain RUE DVT -was onIV heparin,then treatment dose lovenox- lovenox held 11/26  Acute on chronic anemia - hgb stable, follow  Acute perforated sigmoid diverticulitis with multiple abscesses -s/pIR perc drains x 2 - 04/16/18, culture with multiple organisms  -S/p3rdIR drain 11/13, culturegrowingFEW CANDIDA TROPICALIS  S/p Exploratory laparotomy, extensive lysis of adhesions, small bowel resection, loop ileostomy, drainage of multiple intra-abdominal abscesses, placement of wound VAC11/20/19 Dr. Georgette Dover -path negativefor malignancy - afebrile, WBC trendingdown(12.6today) - midline vac MWF - CT11/27showed 3 new fluid collections that IR is not able to drain - ID recs Zosyn & fluconazole - agree with  ID -merrem 10/27-11/26, flagyl 11/16>>11/17;Zosyn 11/28>> FEN -SOFT diet, Prealbumin19.4 , ensure Foley -external cath  Plan-discharge to SNF. 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 Nothing to do surgical at this time.    LOS: 36 days    Subjective: CC: hartman's  Pt is sleeping. She woke and has no complaints. Spoke to husband about follow up and drain output that it is likely a fistula.   Objective: Vital signs in last 24 hours: Temp:  [98 F (36.7 C)-98.9 F (37.2 C)] 98.3 F (36.8 C) (12/03 0436) Pulse Rate:  [69-106] 90 (12/03 0436) Resp:  [18-20] 18 (12/03 0436) BP: (111-120)/(68-89) 119/74 (12/03 0436) SpO2:  [93 %-95 %] 95 % (12/03 0436) Last BM Date: 05/12/18  Intake/Output from previous day: 12/02 0701 - 12/03 0700 In: 802.9 [I.V.:390.9; IV Piggyback:407] Out: 760 [Drains:210; Stool:550] Intake/Output this shift: No intake/output data recorded.  PE: Gen: Alert, NAD, pleasant, cooperative Pulm:Rate  andeffort normal Abd: Soft,obese,ND, +BS,midline with vac in place, R drain with yellow cloudy drainage, L sided surgical drains x 2 (1 with dark green output that looks like stool, one with dark brown clear drainage), no peritonitis Skin: no rashes noted, warm and dry  Anti-infectives: Anti-infectives (From admission, onward)   Start     Dose/Rate Route Frequency Ordered Stop   05/12/18 0200  vancomycin (VANCOCIN) 1,250 mg in sodium chloride 0.9 % 250 mL IVPB     1,250 mg 166.7 mL/hr over 90 Minutes Intravenous Every 12 hours 05/11/18 1235     05/11/18 1300  vancomycin (VANCOCIN) 1,500 mg in sodium chloride 0.9 % 500 mL IVPB     1,500 mg 250 mL/hr over 120 Minutes Intravenous  Once 05/11/18 1233 05/11/18 2000   05/11/18 1300  fluconazole (DIFLUCAN) tablet 400 mg     400 mg Oral Daily 05/11/18 1250 06/08/18 0959   05/08/18 0815  piperacillin-tazobactam (ZOSYN) IVPB 3.375 g     3.375 g 12.5 mL/hr over 240 Minutes Intravenous Every 8 hours 05/08/18 0801 06/07/18 2359   05/05/18 1245  meropenem (MERREM) 1 g in sodium chloride 0.9 % 100 mL IVPB  Status:  Discontinued     1 g 200 mL/hr over 30 Minutes Intravenous Every 8 hours 05/05/18 1245 05/06/18 1006   05/01/18 1600  meropenem (MERREM) 1 g in sodium chloride 0.9 % 100 mL IVPB  Status:  Discontinued     1 g 200 mL/hr over 30 Minutes Intravenous Every 8 hours 05/01/18 1539 05/05/18 1243   04/30/18 1215  fluconazole (DIFLUCAN) IVPB 400 mg  Status:  Discontinued     400 mg 100 mL/hr over 120 Minutes Intravenous To Surgery 04/30/18  1200 05/01/18 1215   04/29/18 1100  fluconazole (DIFLUCAN) IVPB 400 mg  Status:  Discontinued     400 mg 100 mL/hr over 120 Minutes Intravenous Every 24 hours 04/29/18 1006 05/11/18 1250   04/27/18 1500  meropenem (MERREM) 1 g in sodium chloride 0.9 % 100 mL IVPB  Status:  Discontinued     1 g 200 mL/hr over 30 Minutes Intravenous Every 12 hours 04/27/18 1008 05/01/18 1539   04/26/18 1300  metroNIDAZOLE  (FLAGYL) IVPB 500 mg  Status:  Discontinued     500 mg 100 mL/hr over 60 Minutes Intravenous Every 6 hours 04/26/18 1257 04/27/18 1041   04/15/18 1700  meropenem (MERREM) 1 g in sodium chloride 0.9 % 100 mL IVPB  Status:  Discontinued     1 g 200 mL/hr over 30 Minutes Intravenous Every 8 hours 04/15/18 1534 04/27/18 1008   04/09/18 2200  meropenem (MERREM) 1 g in sodium chloride 0.9 % 100 mL IVPB  Status:  Discontinued     1 g 200 mL/hr over 30 Minutes Intravenous Every 8 hours 04/09/18 1244 04/14/18 0944   04/06/18 1300  meropenem (MERREM) 1 g in sodium chloride 0.9 % 100 mL IVPB  Status:  Discontinued     1 g 200 mL/hr over 30 Minutes Intravenous Every 12 hours 04/06/18 1145 04/09/18 1244   04/06/18 1145  meropenem (MERREM) 1 g in sodium chloride 0.9 % 100 mL IVPB  Status:  Discontinued     1 g 200 mL/hr over 30 Minutes Intravenous Every 8 hours 04/06/18 1139 04/06/18 1145   04/06/18 0900  ciprofloxacin (CIPRO) IVPB 400 mg  Status:  Discontinued     400 mg 200 mL/hr over 60 Minutes Intravenous Every 12 hours 04/06/18 0732 04/06/18 1139   04/06/18 0300  metroNIDAZOLE (FLAGYL) IVPB 500 mg  Status:  Discontinued     500 mg 100 mL/hr over 60 Minutes Intravenous Every 8 hours 04/05/18 2335 04/06/18 1139   04/05/18 1930  ciprofloxacin (CIPRO) IVPB 400 mg     400 mg 200 mL/hr over 60 Minutes Intravenous  Once 04/05/18 1925 04/05/18 2217   04/05/18 1930  metroNIDAZOLE (FLAGYL) IVPB 500 mg     500 mg 100 mL/hr over 60 Minutes Intravenous  Once 04/05/18 1925 04/05/18 2102      Lab Results:  Recent Labs    05/12/18 0338 05/13/18 0412  WBC 12.6* 10.5  HGB 7.7* 7.5*  HCT 24.5* 24.4*  PLT 324 368   BMET Recent Labs    05/11/18 0321 05/13/18 0412  NA 136 137  K 3.6 3.7  CL 99 103  CO2 26 23  GLUCOSE 88 85  BUN 23 16  CREATININE 0.93 0.92  CALCIUM 7.8* 7.9*   PT/INR No results for input(s): LABPROT, INR in the last 72 hours. CMP     Component Value Date/Time   NA 137  05/13/2018 0412   K 3.7 05/13/2018 0412   CL 103 05/13/2018 0412   CO2 23 05/13/2018 0412   GLUCOSE 85 05/13/2018 0412   BUN 16 05/13/2018 0412   CREATININE 0.92 05/13/2018 0412   CALCIUM 7.9 (L) 05/13/2018 0412   PROT 4.9 (L) 05/13/2018 0412   ALBUMIN 1.6 (L) 05/13/2018 0412   AST 22 05/13/2018 0412   ALT 16 05/13/2018 0412   ALKPHOS 81 05/13/2018 0412   BILITOT 0.6 05/13/2018 0412   GFRNONAA >60 05/13/2018 0412   GFRAA >60 05/13/2018 0412   Lipase  No results found for: LIPASE  Studies/Results: No results found.    Kalman Drape , [facility] 05/13/2018, 8:23 AM  Pager: [phone] Mon-Wed, Friday 7:00am-4:30pm Thurs 7am-11:30am  Consults: [phone]

## 2018-05-14 ENCOUNTER — Non-Acute Institutional Stay (SKILLED_NURSING_FACILITY): Payer: Medicare Other | Admitting: Internal Medicine

## 2018-05-14 ENCOUNTER — Other Ambulatory Visit: Payer: Self-pay

## 2018-05-14 ENCOUNTER — Encounter: Payer: Self-pay | Admitting: Internal Medicine

## 2018-05-14 ENCOUNTER — Encounter (HOSPITAL_COMMUNITY)
Admission: RE | Admit: 2018-05-14 | Discharge: 2018-05-14 | Disposition: A | Discharge status: Home / Self Care | Payer: Medicare Other | Source: Skilled Nursing Facility | Attending: Internal Medicine | Admitting: Internal Medicine

## 2018-05-14 DIAGNOSIS — E1169 Type 2 diabetes mellitus with other specified complication: Secondary | ICD-10-CM

## 2018-05-14 DIAGNOSIS — K5720 Diverticulitis of large intestine with perforation and abscess without bleeding: Secondary | ICD-10-CM | POA: Diagnosis not present

## 2018-05-14 DIAGNOSIS — D631 Anemia in chronic kidney disease: Secondary | ICD-10-CM | POA: Diagnosis not present

## 2018-05-14 DIAGNOSIS — A419 Sepsis, unspecified organism: Secondary | ICD-10-CM

## 2018-05-14 DIAGNOSIS — N183 Chronic kidney disease, stage 3 unspecified: Secondary | ICD-10-CM

## 2018-05-14 LAB — BASIC METABOLIC PANEL
Anion gap: 9 (ref 5–15)
BUN: 15 mg/dL (ref 8–23)
CO2: 22 mmol/L (ref 22–32)
CREATININE: 0.7 mg/dL (ref 0.44–1.00)
Calcium: 7.9 mg/dL — ABNORMAL LOW (ref 8.9–10.3)
Chloride: 106 mmol/L (ref 98–111)
GFR calc Af Amer: >60 mL/min (ref >60)
GFR calc non Af Amer: >60 mL/min (ref >60)
Glucose, Bld: 106 mg/dL — ABNORMAL HIGH (ref 70–99)
Potassium: 3.7 mmol/L (ref 3.5–5.1)
Sodium: 137 mmol/L (ref 135–145)

## 2018-05-14 LAB — CBC WITH DIFFERENTIAL/PLATELET
Abs Immature Granulocytes: 0.1 10*3/uL — ABNORMAL HIGH (ref 0.00–0.07)
BASOS PCT: 1 %
Basophils Absolute: 0.1 10*3/uL (ref 0.0–0.1)
Eosinophils Absolute: 0.6 10*3/uL — ABNORMAL HIGH (ref 0.0–0.5)
Eosinophils Relative: 6 %
HCT: 26.3 % — ABNORMAL LOW (ref 36.0–46.0)
Hemoglobin: 8.2 g/dL — ABNORMAL LOW (ref 12.0–15.0)
Immature Granulocytes: 1 %
Lymphocytes Relative: 6 %
Lymphs Abs: 0.6 10*3/uL — ABNORMAL LOW (ref 0.7–4.0)
MCH: 30.5 pg (ref 26.0–34.0)
MCHC: 31.2 g/dL (ref 30.0–36.0)
MCV: 97.8 fL (ref 80.0–100.0)
Monocytes Absolute: 1.4 10*3/uL — ABNORMAL HIGH (ref 0.1–1.0)
Monocytes Relative: 14 %
NRBC: 1.7 % — AB (ref 0.0–0.2)
Neutro Abs: 7.3 10*3/uL (ref 1.7–7.7)
Neutrophils Relative %: 72 %
PLATELETS: 380 10*3/uL (ref 150–400)
RBC: 2.69 MIL/uL — ABNORMAL LOW (ref 3.87–5.11)
RDW: 20.1 % — ABNORMAL HIGH (ref 11.5–15.5)
WBC: 10 10*3/uL (ref 4.0–10.5)

## 2018-05-14 LAB — C-REACTIVE PROTEIN: CRP: 32 mg/dL — ABNORMAL HIGH (ref <1.0)

## 2018-05-14 LAB — SEDIMENTATION RATE: Sed Rate: 104 mm/hr — ABNORMAL HIGH (ref 0–22)

## 2018-05-14 LAB — PROTIME-INR
INR: 1.05
Prothrombin Time: 13.6 seconds (ref 11.4–15.2)

## 2018-05-14 MED ORDER — PREGABALIN 75 MG PO CAPS: 75.0000 mg | ORAL_CAPSULE | Freq: Three times a day (TID) | ORAL | 0 refills | Status: DC

## 2018-05-14 MED ORDER — TEMAZEPAM 30 MG PO CAPS: 30.0000 mg | ORAL_CAPSULE | Freq: Every day | ORAL | 0 refills | Status: DC

## 2018-05-14 MED ORDER — HYDROCODONE-ACETAMINOPHEN 10-325 MG PO TABS: 1.0000 | ORAL_TABLET | Freq: Four times a day (QID) | ORAL | 0 refills | Status: DC

## 2018-05-14 MED ORDER — TEMAZEPAM 30 MG PO CAPS: 30.0000 mg | ORAL_CAPSULE | Freq: Every day | ORAL | 2 refills | Status: DC

## 2018-05-14 NOTE — Telephone Encounter (Signed)
RX Fax for [facility]@ [phone]  

## 2018-05-14 NOTE — Progress Notes (Signed)
Location:    [facility] Room Number: 144/P Place of Service:  SNF [phone]) Provider: Granville Lewis PA-C  [name], MD  Patient Care Team: [name], MD as PCP - General (Internal Medicine)  Extended Emergency Contact Information Primary Emergency Contact: Kegley,Robert Address: [address]           [place], [place] [address] Johnnette Litter of [place] Phone: [phone] Mobile Phone: [phone] Relation: Spouse  Code Status:  Full Code Goals of care: Advanced Directive information Advanced Directives 05/14/2018  Does Patient Have a Medical Advance Directive? Yes  Type of Advance Directive (No Data)  Does patient want to make changes to medical advance directive? No - Patient declined  Would patient like information on creating a medical advance directive? No - Patient declined  Pre-existing out of facility DNR order (yellow form or pink MOST form) -     Chief Complaint  Patient presents with  . Hospitalization Follow-up    Hospitalization F/U Visit   Status post hospitalization for sigmoid diverticulitis with perforation and abscess.   HPI:  Pt is a 72 y.o. female seen today for a hospital f/u for sigmoid diverticulitis with perforation and abscess.  She was admitted with abdominal pain and found to have the sigmoid diverticulitis with perforation and abscess initially she was improving on IV Merrem but continued to have abdominal pain subsequently CT scan of the abdomen was obtained showed worsening diverticulitis with abdominal fluid collection- she did receive 3 percutaneous drains but repeat CT showed remaining fluid collection and abscess.  She required an exploratory laparotomy with lysis of the adhesions small bowel resection with loop ileostomy and placement of a wound VAC. Abscess cultures grew candidiasis as well as Clostridium--along with Peptostreptococcus and ampicillin resistant enterococcus.  She has been started on IV Zosyn IV  vancomycin and p.o. Diflucan-will need ID and surgical follow-up.  1 drain continues to drain feculent material suggesting development of a fistula and surgery is aware of this but currently no surgical intervention is desired.  Again she will need follow-up by general surgery.  This is complicated with an acute right upper extremity DVT she is on full dose anticoagulation with Lovenox and Coumadin-again will Lovenox will be continued until INR reaches 2.  She also had anemia of critical disease on top of her baseline anemia of chronic disease with iron deficiency she is on iron.  Her hemoglobin went down despite holding PNA and diuresing her with Lasix- she did require transfusion- hemoglobin has risen slightly today up to 2 it was 7.5 yesterday.  She also had acute on chronic kidney injury but this appears stabilized with creatinine of 0.70 today.  She did have atrial tachycardia in the hospital apparently this was somewhat transitory she is on low-dose Cardizem as well as Lopressor listed pulse earlier today was 112 but I got in the low 90s when I auscultated this afternoon at this point will monitor.   She also was found to have a magnesium level 1.5 apparently this was replenished in the hospital will update level.  She also has type 2 diabetes which appeared to be controlled she does have sliding scale here CBG so far been in the low mid 100s.  It appears she also was started Lexapro in the hospital secondary to depression.  Currently she is lying in bed she appears to be comfortable and says she feels she is doing relatively well considering everything she has gone through-vital signs appear  to be stable she is afebrile      Past Medical History:  Diagnosis Date  . Anginal pain [facility])    sees Dr Einar Gip.   . Arthritis    rheumatoid ..   . Diabetes mellitus without complication (HCC)    Metformin 2.3.2017  . Diverticulitis   . Diverticulitis of large intestine with  perforation and abscess 04/05/2018  . Dysrhythmia   . Fibromyalgia   . GERD (gastroesophageal reflux disease)   . High cholesterol   . Hypertension   . Liver disease 08- 2012   per Dr Dagmar Hait pt has enlarged liver  . Peripheral vascular disease (HCC)    legs  . Pneumonia 06/17/2016  . Sleep apnea    sleep study  oct 2012  . Stroke [facility]) 2011   [facility]     Past Surgical History:  Procedure Laterality Date  . [place]  . CHOLECYSTECTOMY  1996  . COLONOSCOPY N/A 02/13/2018   Procedure: COLONOSCOPY;  Surgeon: Rogene Houston, MD;  [facility];  Service: Endoscopy;  Laterality: N/A;  8:30  . CORONARY ARTERY BYPASS GRAFT  1986  . ESOPHAGEAL DILATION N/A 01/13/2018   Procedure: ESOPHAGEAL DILATION;  Surgeon: Rogene Houston, MD;  [facility];  Service: Endoscopy;  Laterality: N/A;  . ESOPHAGOGASTRODUODENOSCOPY N/A 01/13/2018   Procedure: ESOPHAGOGASTRODUODENOSCOPY (EGD);  Surgeon: Rogene Houston, MD;  [facility];  Service: Endoscopy;  Laterality: N/A;  . EYE SURGERY  2013   cat ext bilateral .  . EYE SURGERY  2002   laser  . FOREIGN BODY REMOVAL  02/13/2018   Procedure: FOREIGN BODY REMOVAL;  Surgeon: Rogene Houston, MD;  [facility];  Service: Endoscopy;;  foreign body removal which appears to be food debri in a stem form removed from colon by DR. Rehman  . [place]- 2007   rod right leg - right wrist plate  . GAS/FLUID EXCHANGE  03/25/2012   Procedure: GAS/FLUID EXCHANGE;  Surgeon: Hayden Pedro, MD;  Location: [facility];  Service: Ophthalmology;  Laterality: Right;  . LAPAROTOMY N/A 04/30/2018   Procedure: EXPLORATORY LAPAROTOMY  LYSIS OF ADHESIONS SMALL BOWEL RESECTION WITH ANASTOMSIS LOOP ILEOSTOMY PLACEMENT OF DRAINS AND WOUND [place];  Surgeon: Donnie Mesa, MD;  Location: [facility];  Service: General;  Laterality: N/A;  . PARS PLANA VITRECTOMY  03/25/2012   Procedure: PARS PLANA VITRECTOMY  WITH 25 GAUGE;  Surgeon: Hayden Pedro, MD;  Location: [facility];  Service: Ophthalmology;  Laterality: Right;    Allergies  Allergen Reactions  . Lactose Intolerance (Gi) Other (See Comments)    G.I. Upset  . Butrans [Buprenorphine] Rash and Other (See Comments)    Infected skin underneath application  . Penicillins Rash    Facial rash Has patient had a PCN reaction causing immediate rash, facial/tongue/throat swelling, SOB or lightheadedness with hypotension: Yes Has patient had a PCN reaction causing severe rash involving mucus membranes or skin necrosis: No Has patient had a PCN reaction that required hospitalization No Has patient had a PCN reaction occurring within the last 10 years: Yes If all of the above answers are "NO", then may proceed with Cephalosporin use.   . Simvastatin Rash    Outpatient Encounter Medications as of 05/14/2018  Medication Sig  . Blood Glucose Monitoring Suppl (FREESTYLE LITE) DEVI See admin instructions.  . clopidogrel (PLAVIX) 75 MG tablet Take 1 tablet (75 mg total) by mouth  daily with breakfast.  . Dexlansoprazole 30 MG capsule Take 30 mg by mouth daily.   . diclofenac sodium (VOLTAREN) 1 % GEL Apply 2 g topically 4 (four) times daily as needed (Pain).   Marland Kitchen diltiazem (CARDIZEM CD) 120 MG 24 hr capsule Take 1 capsule (120 mg total) by mouth daily.  Marland Kitchen docusate sodium (COLACE) 100 MG capsule Take 2 capsules (200 mg total) by mouth at bedtime.  . enoxaparin (LOVENOX) 120 MG/0.8ML injection Inject 0.73 mLs (110 mg total) into the skin every 12 (twelve) hours.  . ferrous sulfate 325 (65 FE) MG tablet Take 1 tablet (325 mg total) by mouth 2 (two) times daily with a meal.  . fluconazole (DIFLUCAN) 200 MG tablet Take 2 tablets (400 mg total) by mouth daily.  Marland Kitchen HYDROcodone-acetaminophen (NORCO) 10-325 MG tablet Take 1 tablet by mouth 4 (four) times daily.  . insulin aspart (NOVOLOG) 100 UNIT/ML injection Before each meal 3 times a day, 140-199 - 2 units, 200-250  - 4 units, 251-299 - 6 units,  300-349 - 8 units,  350 or above 10 units. Dispense syringes and needles as needed, Ok to switch to PEN if approved. Substitute to any brand approved. DX DM2, Code E11.65  . isosorbide dinitrate (ISORDIL) 5 MG tablet Take 1 tablet (5 mg total) by mouth 3 (three) times daily before meals.  Marland Kitchen leflunomide (ARAVA) 20 MG tablet Take 20 mg by mouth daily.  . metoprolol succinate (TOPROL-XL) 25 MG 24 hr tablet Take 25 mg by mouth daily.  . piperacillin-tazobactam (ZOSYN) IVPB Inject 3.375 g into the vein every 6 (six) hours. Indication:  Intraabdominal abscess Last Day of Therapy:  06/07/18 Labs - Once weekly:  CBC/D and BMP, Labs - Every other week:  ESR and CRP  . pravastatin (PRAVACHOL) 40 MG tablet Take 40 mg by mouth at bedtime.  . predniSONE (DELTASONE) 5 MG tablet Take 5 mg by mouth daily with breakfast.  . pregabalin (LYRICA) 75 MG capsule Take 1 capsule (75 mg total) by mouth 3 (three) times daily.  . Tafluprost (ZIOPTAN) 0.0015 % SOLN Place 1 drop into both eyes at bedtime.   . temazepam (RESTORIL) 30 MG capsule Take 1 capsule (30 mg total) by mouth at bedtime.  . timolol (BETIMOL) 0.5 % ophthalmic solution Place 1 drop into both eyes 2 (two) times daily.   . Tofacitinib Citrate (XELJANZ) 5 MG TABS Take 1 tablet by mouth daily.  . vancomycin IVPB Inject 1,250 mg into the vein every 12 (twelve) hours. Indication:  Intraabdominal abscess Last Day of Therapy:  06/07/18 Labs - Sunday/Monday:  CBC/D, BMP, and vancomycin trough. Labs - Thursday:  BMP and vancomycin trough Labs - Every other week:  ESR and CRP  . VITAMIN D, ERGOCALCIFEROL, PO Take 50,000 Units by mouth once a week. Thursdays  . warfarin (COUMADIN) 2 MG tablet Take 1 tablet (2 mg total) by mouth daily.   No facility-administered encounter medications on file as of 05/14/2018.      Review of Systems   In general she is not complaining of any fever or chills.  Skin does not complain of rashes or  itching she does have an abdominal wound has orders for a wound VAC.  Head ears eyes nose mouth and throat has prescription lenses is not complain of visual changes or sore throat.  Respiratory does not complain of being short of breath or having a cough.  Cardiac denies chest pain has some mild she describes his baseline lower extremity  edema.  GI has a complicated history as noted above but is not really complaining of abdominal discomfort at this point.  GU is not complaining of dysuria.  Musculoskeletal does have a history of diffuse joint pain with fibromyalgia does have weakness and some debility secondary to lengthy hospitalization.  As well as history of rheumatoid arthritis  Neurologic is not complaining of dizziness headache or numbness  Psych does not complain of being overtly depressed or anxious she has been started Lexapro secondary apparently to concerns of increased depression during her hospitalization  Immunization History  Administered Date(s) Administered  . Influenza Split 03/26/2012, 03/11/2013  . Influenza, High Dose Seasonal PF 04/07/2018   Pertinent  Health Maintenance Due  Topic Date Due  . FOOT EXAM  06/14/2018 (Originally 08/14/1955)  . MAMMOGRAM  06/14/2018 (Originally 11/17/2016)  . OPHTHALMOLOGY EXAM  06/14/2018 (Originally 08/14/1955)  . URINE MICROALBUMIN  06/14/2018 (Originally 08/14/1955)  . DEXA SCAN  06/14/2018 (Originally 08/13/2009)  . PNA vac Low Risk Adult (1 of 2 - PCV13) 06/14/2018 (Originally 08/13/2009)  . HEMOGLOBIN A1C  10/07/2018  . COLONOSCOPY  02/14/2028  . INFLUENZA VACCINE  Completed   No flowsheet data found. Functional Status Survey:    Vitals:   05/14/18 1416  BP: 136/67  Pulse: (!) 112  Resp: 20  Temp: (!) 97.3 F (36.3 C)  TempSrc: Oral   Pulse on auscultation was 92  Physical Exam   In general this is a pleasant elderly female well-nourished in no distress lying comfortably in bed.  Her skin is warm and dry abdominal  wound is currently covered per assessment by wound care there is moist pink tissue present apparently there is a fistula- no signs at this time of surrounding erythema or gross infection.  Eyes she has prescription lenses visual acuity appears to be intact sclera and conjunctive are clear.  Oropharynx is clear mucous membranes moist.  Chest is clear to auscultation with somewhat shallow air entry there is no labored breathing.  Heart is regular rate and rhythm with occasional irregular beats pulse is in the 90s she has mild lower extremity edema pedal pulses are intact bilaterally.  Abdomen is somewhat obese soft does not appear to be tender she does have the abdominal wound covered she also has a drain placed- It appears it has been emptied.  Ileostomy output continues to be somewhat dark. As noted above abdominal wound has moist pink tissue with no surrounding erythema indicating infection-there is apparently a fistula per discussion with wound care.  Musculoskeletal has general debility and weakness but is able to move all extremities x4 upper extremity strength appears preserved has lower extremity weakness.  Neurologic is grossly intact no lateralizing findings her speech is clear.  Psych she is alert and oriented pleasant and appropriate    Labs reviewed: Recent Labs    05/06/18 0406 05/07/18 0348  05/09/18 0429 05/10/18 0906 05/11/18 0321 05/13/18 0412 05/14/18 0615  NA 143 141   < > 136 138 136 137 137  K 4.0 3.9   < > 3.3* 3.7 3.6 3.7 3.7  CL 102 101   < > 101 99 99 103 106  CO2 31 30   < > 28 26 26 23 22   GLUCOSE 113* 121*   < > 90 95 88 85 106*  BUN 50* 48*   < > 32* 22 23 16 15   CREATININE 1.14* 1.12*   < > 1.00 1.00 0.93 0.92 0.70  CALCIUM 8.1* 7.9*   < >  7.8* 7.8* 7.8* 7.9* 7.9*  MG 1.8 1.5*   < > 1.8 1.4* 2.1 1.5*  --   PHOS 5.7* 4.7*  --  3.6  --   --   --   --    < > = values in this interval not displayed.   Recent Labs    05/05/18 0351 05/07/18 0348  05/13/18 0412  AST 64* 44* 22  ALT 47* 42 16  ALKPHOS 93 101 81  BILITOT 0.4 0.6 0.6  PROT 4.9* 5.0* 4.9*  ALBUMIN 1.7* 1.8* 1.6*   Recent Labs    04/28/18 0305  05/05/18 0351  05/12/18 0338 05/13/18 0412 05/14/18 0615  WBC 10.2   < > 13.1*   < > 12.6* 10.5 10.0  NEUTROABS 7.7  --  10.7*  --   --   --  7.3  HGB 7.7*   < > 8.0*   < > 7.7* 7.5* 8.2*  HCT 24.9*   < > 24.8*   < > 24.5* 24.4* 26.3*  MCV 93.6   < > 91.5   < > 93.5 94.9 97.8  PLT 348   < > 262   < > 324 368 380   < > = values in this interval not displayed.   Lab Results  Component Value Date   TSH 7.944 (H) 05/07/2018   Lab Results  Component Value Date   HGBA1C 5.9 (H) 04/07/2018   Lab Results  Component Value Date   CHOL 145 06/21/2013   HDL 45 06/21/2013   LDLCALC 65 06/21/2013   TRIG 185 (H) 05/05/2018   CHOLHDL 3.2 06/21/2013    Significant Diagnostic Results in last 30 days:  No results found.  Assessment/Plan  #1 history of diverticulitis with perforation and abscess- she has a very complicated history as noted above and eventually required drainage and a exploratory laparotomy with removal of adhesions and a loop ileostomy.  She continues on Diflucan as well as vancomycin IV and pharmacy is monitoring levels as well as Zosyn.  There are orders for a wound VAC for the abdominal wound nursing at this point will hold this temporarily secondary to concerns of a fistula they have notified surgery for follow-up and further recommendations.  She will need surgical as well as infectious disease follow-up.  2.  Anemia of critical disease on top of baseline chronic disease and iron deficiency she is on iron supplementation did receive a transfusion hemoglobin has shown improvement up to 8.2 will monitor this.  Will update a CBC in 2 days.  She continues on a PPI as well.  3.  History of right upper extremity DVT she is on Lovenox and Coumadin- INR today is low but would be expected since she is  just been started apparently on Coumadin will update this in 2 dayscontinue Lovenox for now until INR reaches 2.  4.-  History of coronary artery disease status post CABG she continues on Plavix and Imdur at this point appears to be stable she is asymptomatic does not complain of chest pain.  5.  History of acute on chronic kidney disease creatinine show stability today at 0.7 with BUN of 15.  6.-  Hypertension at this point appears to be stabilized we have fairly minimal readings however this will have to be monitored is on low-dose Cardizem as well as Lopressor.  7.-  Type 2 diabetes this appears stable she does have NovoLog sliding scale if needed CBG so far in the lower mid 100s.  8.  History of atrial tachycardia continues on Cardizem and Lopressor at this point rate appears controlled appears earlier pulse was somewhat over 100 I suspect there may have been more activity related but this will have to be watched  9.  History of low magnesium of 1.5 apparently this was replenished in the hospital will need updating to ensure normalization.  10.  History of depression she continues on Lexapro this was started apparently in the hospital appears relatively stable.  11.  History of fibromyalgia continues on Lyrica.  12.  History of rheumatoid arthritis continues on leflunomide as well as low-dose prednisone Garner Nash also has an order for hydrocodone as needed \#13 insomnia she does have an order for Restoril at night.  I do note there are orders to do a follow-up two-view chest x-ray in 1 week we will write for such.  She will need monitoring closely of her INR as well.  [id number] note greater than 50 minutes spent assessing patient- reviewing her chart and labs-and coordinating and formulating a plan of care for numerous diagnoses- of note greater than 50% of time spent coordinating a plan of care with input as noted above

## 2018-05-15 ENCOUNTER — Encounter (HOSPITAL_COMMUNITY)
Admission: RE | Admit: 2018-05-15 | Discharge: 2018-05-15 | Disposition: A | Discharge status: Home / Self Care | Payer: Medicare Other | Source: Skilled Nursing Facility | Attending: Internal Medicine | Admitting: Internal Medicine

## 2018-05-15 ENCOUNTER — Encounter: Payer: Self-pay | Admitting: Internal Medicine

## 2018-05-15 ENCOUNTER — Non-Acute Institutional Stay (SKILLED_NURSING_FACILITY): Payer: Medicare Other | Admitting: Internal Medicine

## 2018-05-15 DIAGNOSIS — N183 Chronic kidney disease, stage 3 unspecified: Secondary | ICD-10-CM

## 2018-05-15 DIAGNOSIS — K5720 Diverticulitis of large intestine with perforation and abscess without bleeding: Secondary | ICD-10-CM

## 2018-05-15 DIAGNOSIS — Z978 Presence of other specified devices: Secondary | ICD-10-CM | POA: Insufficient documentation

## 2018-05-15 DIAGNOSIS — E1169 Type 2 diabetes mellitus with other specified complication: Secondary | ICD-10-CM | POA: Diagnosis not present

## 2018-05-15 DIAGNOSIS — M797 Fibromyalgia: Secondary | ICD-10-CM

## 2018-05-15 DIAGNOSIS — D631 Anemia in chronic kidney disease: Secondary | ICD-10-CM | POA: Diagnosis not present

## 2018-05-15 DIAGNOSIS — M069 Rheumatoid arthritis, unspecified: Secondary | ICD-10-CM | POA: Diagnosis not present

## 2018-05-15 DIAGNOSIS — A419 Sepsis, unspecified organism: Secondary | ICD-10-CM | POA: Insufficient documentation

## 2018-05-15 DIAGNOSIS — Z48815 Encounter for surgical aftercare following surgery on the digestive system: Secondary | ICD-10-CM | POA: Insufficient documentation

## 2018-05-15 LAB — VANCOMYCIN, TROUGH: Vancomycin Tr: 25 ug/mL (ref 15–20)

## 2018-05-15 LAB — MAGNESIUM: Magnesium: 1.4 mg/dL — ABNORMAL LOW (ref 1.7–2.4)

## 2018-05-15 NOTE — Progress Notes (Signed)
Provider:Wynter Grave Lyndel Safe MD   Location:    [place] Room Number: 144/P Place of Service:  SNF ([phone])  PCP: [name], MD Patient Care Team: [name], MD as PCP - General (Internal Medicine)  Extended Emergency Contact Information Primary Emergency Contact: Ocain,Robert Address: [address]           [place], [place] [address] Johnnette Litter of [place] Phone: [phone] Mobile Phone: [phone] Relation: Spouse  Code Status: Full Code Goals of Care: Advanced Directive information Advanced Directives 05/15/2018  Does Patient Have a Medical Advance Directive? Yes  Type of Advance Directive (No Data)  Does patient want to make changes to medical advance directive? No - Patient declined  Would patient like information on creating a medical advance directive? No - Patient declined  Pre-existing out of facility DNR order (yellow form or pink MOST form) -      Chief Complaint  Patient presents with  . New Admit To SNF    New Admissiom Visit    HPI: Patient is a 72 y.o. female seen today for admission to SNF for therapy and IV antibiotics Patient stayed in the hospital from 10/26-12/03 For Perforated Sigmoid Diverticulitis with Multiple abscess.  Patient has h/o Hypertension, Type 2 Diabetes, CKD, rheumatoid arthritis,CAD, s/p CABG, Hyperlipidemia, PVD  Patient was initially admitted to [facility] for Abdominal pain and was Found to have Sigmoid Diverticulitis . She was treated with IV antibiotics. But her Pain worsen and she was found ot have Perforation of Sigmoid Diverticulitis. She was started on IV Meropenem. But repeat CT scan showed worsening Diverticulitis with multiple Abdominal Fluids collections. She was transferred to [facility] for IR to Place drains. Which was done on 11/06. But her repeat CT scans showed Persistent Fluid Collections in her abdomen inspite of Long Term Antibiotics and Drains. She was taken for Exploratory Laparotomy on 11/20. Went  through Extensive Lysis of Adhesions, Loop Ileostomy, Drainage of Multiple abdominal abscesses..with Open Surgical wound and VAC placement  She was also started on Diflucan and continued on Meropenum. She was seen by ID and they changed her to Zosyn, Vanco and Diflucan to cover for her Cultures. She is suppose to be on that for 4 weeks. Her 1 drain in LUQ is draining Feculent Material worrisome  For Fistula.  She also Developed Right UE DVT due to PICC line and is nopw on Lovenox bridge with Coumadin. She also had anemia receiving 1 unit of PRBS on 11/27 She was also started on Lyrica for her Fibromyalgia  Patient is now in SNF for therapy. She denied any abdominal pain , Fever or Chills. Her appetite stays poor and her husband wanted to know if he can bring food for her.     Past Medical History:  Diagnosis Date  . Anginal pain [facility])    sees Dr Einar Gip.   . Arthritis    rheumatoid ..   . Diabetes mellitus without complication (HCC)    Metformin 2.3.2017  . Diverticulitis   . Diverticulitis of large intestine with perforation and abscess 04/05/2018  . Dysrhythmia   . Fibromyalgia   . GERD (gastroesophageal reflux disease)   . High cholesterol   . Hypertension   . Liver disease 08- 2012   per Dr Dagmar Hait pt has enlarged liver  . Peripheral vascular disease (HCC)    legs  . Pneumonia 06/17/2016  . Sleep apnea    sleep study  oct 2012  . Stroke [facility]) 2011  [place]     Past Surgical History:  Procedure Laterality Date  . [place]  . CHOLECYSTECTOMY  1996  . COLONOSCOPY N/A 02/13/2018   Procedure: COLONOSCOPY;  Surgeon: Rogene Houston, MD;  [facility];  Service: Endoscopy;  Laterality: N/A;  8:30  . CORONARY ARTERY BYPASS GRAFT  1986  . ESOPHAGEAL DILATION N/A 01/13/2018   Procedure: ESOPHAGEAL DILATION;  Surgeon: Rogene Houston, MD;  [facility];  Service: Endoscopy;  Laterality: N/A;  . ESOPHAGOGASTRODUODENOSCOPY N/A  01/13/2018   Procedure: ESOPHAGOGASTRODUODENOSCOPY (EGD);  Surgeon: Rogene Houston, MD;  [facility];  Service: Endoscopy;  Laterality: N/A;  . EYE SURGERY  2013   cat ext bilateral .  . EYE SURGERY  2002   laser  . FOREIGN BODY REMOVAL  02/13/2018   Procedure: FOREIGN BODY REMOVAL;  Surgeon: Rogene Houston, MD;  [facility];  Service: Endoscopy;;  foreign body removal which appears to be food debri in a stem form removed from colon by DR. Rehman  . [place]- 2007   rod right leg - right wrist plate  . GAS/FLUID EXCHANGE  03/25/2012   Procedure: GAS/FLUID EXCHANGE;  Surgeon: Hayden Pedro, MD;  Location: [place];  Service: Ophthalmology;  Laterality: Right;  . LAPAROTOMY N/A 04/30/2018   Procedure: EXPLORATORY LAPAROTOMY  LYSIS OF ADHESIONS SMALL BOWEL RESECTION WITH ANASTOMSIS LOOP ILEOSTOMY PLACEMENT OF DRAINS AND WOUND [place];  Surgeon: Donnie Mesa, MD;  Location: [place];  Service: General;  Laterality: N/A;  . PARS PLANA VITRECTOMY  03/25/2012   Procedure: PARS PLANA VITRECTOMY WITH 25 GAUGE;  Surgeon: Hayden Pedro, MD;  Location: [place];  Service: Ophthalmology;  Laterality: Right;    reports that she quit smoking about 33 years ago. Her smoking use included cigarettes. She has a 1.25 pack-year smoking history. She has never used smokeless tobacco. She reports that she drinks alcohol. She reports that she does not use drugs. Social History   Socioeconomic History  . Marital status: Married    Spouse name: Herbie Baltimore  . Number of children: 2  . Years of education: 50  . Highest education level: Not on file  Occupational History  . Occupation: Retired  Scientific laboratory technician  . Financial resource strain: Not on file  . Food insecurity:    Worry: Not on file    Inability: Not on file  . Transportation needs:    Medical: Not on file    Non-medical: Not on file  Tobacco Use  . Smoking status: Former Smoker    Packs/day: 0.25    Years: 5.00    Pack  years: 1.25    Types: Cigarettes    Last attempt to quit: 06/11/1984    Years since quitting: 33.9  . Smokeless tobacco: Never Used  Substance and Sexual Activity  . Alcohol use: Yes    Comment: occ  . Drug use: No    Types: Hydrocodone  . Sexual activity: Yes  Lifestyle  . Physical activity:    Days per week: Not on file    Minutes per session: Not on file  . Stress: Not on file  Relationships  . Social connections:    Talks on phone: Not on file    Gets together: Not on file    Attends religious service: Not on file    Active member of club or organization: Not on file    Attends meetings of  clubs or organizations: Not on file    Relationship status: Not on file  . Intimate partner violence:    Fear of current or ex partner: Not on file    Emotionally abused: Not on file    Physically abused: Not on file    Forced sexual activity: Not on file  Other Topics Concern  . Not on file  Social History Narrative  . Not on file    Functional Status Survey:    Family History  Problem Relation Age of Onset  . Hypertension Daughter   . Rheum arthritis Maternal Grandmother     Health Maintenance  Topic Date Due  . FOOT EXAM  06/14/2018 (Originally 08/14/1955)  . MAMMOGRAM  06/14/2018 (Originally 11/17/2016)  . OPHTHALMOLOGY EXAM  06/14/2018 (Originally 08/14/1955)  . URINE MICROALBUMIN  06/14/2018 (Originally 08/14/1955)  . DEXA SCAN  06/14/2018 (Originally 08/13/2009)  . TETANUS/TDAP  06/14/2018 (Originally 08[birth year]14/1964)  . Hepatitis C Screening  06/14/2018 (Originally 12-14-1945)  . PNA vac Low Risk Adult (1 of 2 - PCV13) 06/14/2018 (Originally 08/13/2009)  . HEMOGLOBIN A1C  10/07/2018  . COLONOSCOPY  02/14/2028  . INFLUENZA VACCINE  Completed    Allergies  Allergen Reactions  . Lactose Intolerance (Gi) Other (See Comments)    G.I. Upset  . Butrans [Buprenorphine] Rash and Other (See Comments)    Infected skin underneath application  . Penicillins Rash    Facial rash Has patient  had a PCN reaction causing immediate rash, facial/tongue/throat swelling, SOB or lightheadedness with hypotension: Yes Has patient had a PCN reaction causing severe rash involving mucus membranes or skin necrosis: No Has patient had a PCN reaction that required hospitalization No Has patient had a PCN reaction occurring within the last 10 years: Yes If all of the above answers are "NO", then may proceed with Cephalosporin use.   . Simvastatin Rash    Outpatient Encounter Medications as of 05/15/2018  Medication Sig  . Blood Glucose Monitoring Suppl (FREESTYLE LITE) DEVI See admin instructions.  . clopidogrel (PLAVIX) 75 MG tablet Take 1 tablet (75 mg total) by mouth daily with breakfast.  . Dexlansoprazole 30 MG capsule Take 30 mg by mouth daily.   . diclofenac sodium (VOLTAREN) 1 % GEL Apply 2 g topically 4 (four) times daily as needed (Pain).   Marland Kitchen diltiazem (CARDIZEM CD) 120 MG 24 hr capsule Take 1 capsule (120 mg total) by mouth daily.  Marland Kitchen docusate sodium (COLACE) 100 MG capsule Take 2 capsules (200 mg total) by mouth at bedtime.  . enoxaparin (LOVENOX) 120 MG/0.8ML injection Inject 0.73 mLs (110 mg total) into the skin every 12 (twelve) hours.  . ferrous sulfate 325 (65 FE) MG tablet Take 1 tablet (325 mg total) by mouth 2 (two) times daily with a meal.  . fluconazole (DIFLUCAN) 200 MG tablet Take 2 tablets (400 mg total) by mouth daily.  Marland Kitchen HYDROcodone-acetaminophen (NORCO) 10-325 MG tablet Take 1 tablet by mouth 4 (four) times daily.  . insulin aspart (NOVOLOG) 100 UNIT/ML injection Before each meal 3 times a day, 140-199 - 2 units, 200-250 - 4 units, 251-299 - 6 units,  300-349 - 8 units,  350 or above 10 units. Dispense syringes and needles as needed, Ok to switch to PEN if approved. Substitute to any brand approved. DX DM2, Code E11.65  . isosorbide dinitrate (ISORDIL) 5 MG tablet Take 1 tablet (5 mg total) by mouth 3 (three) times daily before meals.  Marland Kitchen leflunomide (ARAVA) 20 MG tablet  Take 20  mg by mouth daily.  . metoprolol succinate (TOPROL-XL) 25 MG 24 hr tablet Take 25 mg by mouth daily.  . piperacillin-tazobactam (ZOSYN) IVPB Inject 3.375 g into the vein every 6 (six) hours. Indication:  Intraabdominal abscess Last Day of Therapy:  06/07/18 Labs - Once weekly:  CBC/D and BMP, Labs - Every other week:  ESR and CRP  . pravastatin (PRAVACHOL) 40 MG tablet Take 40 mg by mouth at bedtime.  . predniSONE (DELTASONE) 5 MG tablet Take 5 mg by mouth daily with breakfast.  . Tafluprost (ZIOPTAN) 0.0015 % SOLN Place 1 drop into both eyes at bedtime.   . temazepam (RESTORIL) 30 MG capsule Take 1 capsule (30 mg total) by mouth at bedtime.  . timolol (BETIMOL) 0.5 % ophthalmic solution Place 1 drop into both eyes 2 (two) times daily.   . Tofacitinib Citrate (XELJANZ) 5 MG TABS Take 1 tablet by mouth daily.  . vancomycin IVPB Inject 1,250 mg into the vein every 12 (twelve) hours. Indication:  Intraabdominal abscess Last Day of Therapy:  06/07/18 Labs - Sunday/Monday:  CBC/D, BMP, and vancomycin trough. Labs - Thursday:  BMP and vancomycin trough Labs - Every other week:  ESR and CRP  . VITAMIN D, ERGOCALCIFEROL, PO Take 50,000 Units by mouth once a week. Thursdays  . warfarin (COUMADIN) 2 MG tablet Take 1 tablet (2 mg total) by mouth daily.  . [DISCONTINUED] pregabalin (LYRICA) 75 MG capsule Take 1 capsule (75 mg total) by mouth 3 (three) times daily.   No facility-administered encounter medications on file as of 05/15/2018.     Review of Systems  Constitutional: Positive for activity change and appetite change.  HENT: Negative.   Respiratory: Negative for cough and shortness of breath.   Cardiovascular: Negative for chest pain.  Gastrointestinal: Negative.   Genitourinary: Negative.   Musculoskeletal: Positive for arthralgias.  Skin: Positive for wound.  Neurological: Positive for weakness.  Psychiatric/Behavioral: Negative.     Vitals:   05/15/18 1809  BP: 131/69    Pulse: (!) 106  Resp: 20  Temp: (!) 97.1 F (36.2 C)  SpO2: 97%   There is no height or weight on file to calculate BMI. Physical Exam  Constitutional: She is oriented to person, place, and time. She appears well-developed.  HENT:  Head: Normocephalic.  Mouth/Throat: Oropharynx is clear and moist.  Eyes: Pupils are equal, round, and reactive to light.  Neck: Neck supple.  Cardiovascular: Normal rate and regular rhythm.  No murmur heard. Pulmonary/Chest: Effort normal and breath sounds normal. No stridor. No respiratory distress. She has no wheezes.  Abdominal: Soft.  Has open midline wound . 2 Drains and Ileostomy Bag  Musculoskeletal: She exhibits no edema.  Neurological: She is alert and oriented to person, place, and time.  No Focal Deficits  Skin: Skin is warm and dry.  Psychiatric: She has a normal mood and affect. Her behavior is normal. Thought content normal.    Labs reviewed: Basic Metabolic Panel: Recent Labs    05/06/18 0406 05/07/18 0348  05/09/18 0429  05/11/18 0321 05/13/18 0412 05/14/18 0615 05/15/18 0815  NA 143 141   < > 136   < > 136 137 137  --   K 4.0 3.9   < > 3.3*   < > 3.6 3.7 3.7  --   CL 102 101   < > 101   < > 99 103 106  --   CO2 31 30   < > 28   < >  _0 --   GLUCOSE 113* 121*   < > 90   < > 88 85 106*  --   BUN 50* 48*   < > 32*   < > _1 --   CREATININE 1.14* 1.12*   < > 1.00   < > 0.93 0.92 0.70  --   CALCIUM 8.1* 7.9*   < > 7.8*   < > 7.8* 7.9* 7.9*  --   MG 1.8 1.5*   < > 1.8   < > 2.1 1.5*  --  1.4*  PHOS 5.7* 4.7*  --  3.6  --   --   --   --   --    < > = values in this interval not displayed.   Liver Function Tests: Recent Labs    05/05/18 0351 05/07/18 0348 05/13/18 0412  AST 64* 44* 22  ALT 47* 42 16  ALKPHOS 93 101 81  BILITOT 0.4 0.6 0.6  PROT 4.9* 5.0* 4.9*  ALBUMIN 1.7* 1.8* 1.6*   No results for input(s): LIPASE, AMYLASE in the last 8760 hours. No results for input(s): AMMONIA in the last 8760  hours. CBC: Recent Labs    04/28/18 0305  05/05/18 0351  05/12/18 0338 05/13/18 0412 05/14/18 0615  WBC 10.2   < > 13.1*   < > 12.6* 10.5 10.0  NEUTROABS 7.7  --  10.7*  --   --   --  7.3  HGB 7.7*   < > 8.0*   < > 7.7* 7.5* 8.2*  HCT 24.9*   < > 24.8*   < > 24.5* 24.4* 26.3*  MCV 93.6   < > 91.5   < > 93.5 94.9 97.8  PLT 348   < > 262   < > 324 368 380   < > = values in this interval not displayed.   Cardiac Enzymes: Recent Labs    04/09/18 2139 04/10/18 0351 04/10/18 0945  TROPONINI 0.07* 0.08* 0.06*   BNP: Invalid input(s): POCBNP Lab Results  Component Value Date   HGBA1C 5.9 (H) 04/07/2018   Lab Results  Component Value Date   TSH 7.944 (H) 05/07/2018   Lab Results  Component Value Date   [id number] 373 05/05/2018   Lab Results  Component Value Date   FOLATE 6.9 05/05/2018   Lab Results  Component Value Date   IRON 45 05/05/2018   TIBC 183 (L) 05/05/2018   FERRITIN 3,581 (H) 05/05/2018    Imaging and Procedures obtained prior to SNF admission: No results found.  Assessment/Plan  Sigmoid Diverticulitis with perforation and Multiple Abdominal Abscess And open Surgical wound Patient has 2 Drains. With 1 draining Purulent material Other did not have any drainage. She is also on Vanco and Zosyn For 4 weeks Oral Diflucan for 4 weeks. No Wound Vac for now. Just Saline dressing for abdominal Wound Follow up with Surgery Acute Right UE DVT On Lovenox and Coumadin Will Continue to follow INR Lovenox to be discontinued when INR reaches 2  Type 2 diabetes mellitus  Oral Hypoglycemics stopped in hospital due to decreased appetite On sliding scale insulin for now Sugars running below 150  CKD (chronic kidney disease) stage 3, Creat Near baseline  Rheumatoid arthritis involving multiple sites, On low dose of Prednisone, On Xelijanz and Leflunomide Hydrocodone PRN for Pain Fibromyalgia Continue Lyrica Dose reduced to 75m TID as patient says 75  Mg makes her Drowzy  Anemia due to Chronic  Disease On Iron S/P Transfusion Hgb Stable for now Will also continue Dexilant H/O Dysphagia On low dose of Nitrates  CAD On Plavix and Beta Blocker and statin Deconditioning Encourage PO intake with Supplements Start therapy  Family/ staff Communication:   Labs/tests ordered:  Total time spent in this patient care encounter was 45_ minutes; greater than 50% of the visit spent counseling patient, reviewing records , Labs and coordinating care for problems addressed at this encounter.

## 2018-05-16 ENCOUNTER — Other Ambulatory Visit: Payer: Self-pay

## 2018-05-16 ENCOUNTER — Encounter (HOSPITAL_COMMUNITY)
Admission: RE | Admit: 2018-05-16 | Discharge: 2018-05-16 | Disposition: A | Discharge status: Home / Self Care | Payer: Medicare Other | Source: Skilled Nursing Facility | Attending: Internal Medicine | Admitting: Internal Medicine

## 2018-05-16 DIAGNOSIS — Z978 Presence of other specified devices: Secondary | ICD-10-CM | POA: Insufficient documentation

## 2018-05-16 DIAGNOSIS — A419 Sepsis, unspecified organism: Secondary | ICD-10-CM | POA: Insufficient documentation

## 2018-05-16 DIAGNOSIS — Z48815 Encounter for surgical aftercare following surgery on the digestive system: Secondary | ICD-10-CM | POA: Insufficient documentation

## 2018-05-16 MED ORDER — PREGABALIN 75 MG PO CAPS: 75.0000 mg | ORAL_CAPSULE | Freq: Three times a day (TID) | ORAL | 0 refills | Status: DC

## 2018-05-16 NOTE — Telephone Encounter (Signed)
RX Fax for [facility]@ [phone]  

## 2018-05-19 ENCOUNTER — Other Ambulatory Visit: Payer: Self-pay | Admitting: Surgery

## 2018-05-19 ENCOUNTER — Inpatient Hospital Stay (HOSPITAL_COMMUNITY)
Admit: 2018-05-19 | Discharge: 2018-05-19 | DRG: 315 | Disposition: A | Discharge status: Home / Self Care | Payer: Medicare Other | Source: Ambulatory Visit | Attending: Internal Medicine | Admitting: Internal Medicine

## 2018-05-19 ENCOUNTER — Encounter (HOSPITAL_COMMUNITY)
Admission: RE | Admit: 2018-05-19 | Discharge: 2018-05-19 | Disposition: A | Discharge status: Home / Self Care | Payer: Medicare Other | Source: Skilled Nursing Facility | Attending: Internal Medicine | Admitting: Internal Medicine

## 2018-05-19 DIAGNOSIS — A419 Sepsis, unspecified organism: Secondary | ICD-10-CM | POA: Insufficient documentation

## 2018-05-19 DIAGNOSIS — T82598A Other mechanical complication of other cardiac and vascular devices and implants, initial encounter: Secondary | ICD-10-CM | POA: Diagnosis not present

## 2018-05-19 DIAGNOSIS — K5720 Diverticulitis of large intestine with perforation and abscess without bleeding: Secondary | ICD-10-CM | POA: Diagnosis present

## 2018-05-19 DIAGNOSIS — R1084 Generalized abdominal pain: Secondary | ICD-10-CM

## 2018-05-19 DIAGNOSIS — K5780 Diverticulitis of intestine, part unspecified, with perforation and abscess without bleeding: Secondary | ICD-10-CM | POA: Diagnosis not present

## 2018-05-19 DIAGNOSIS — Y713 Surgical instruments, materials and cardiovascular devices (including sutures) associated with adverse incidents: Secondary | ICD-10-CM

## 2018-05-19 DIAGNOSIS — Z48815 Encounter for surgical aftercare following surgery on the digestive system: Secondary | ICD-10-CM | POA: Insufficient documentation

## 2018-05-19 DIAGNOSIS — Z1622 Resistance to vancomycin related antibiotics: Secondary | ICD-10-CM | POA: Insufficient documentation

## 2018-05-19 LAB — BASIC METABOLIC PANEL
Anion gap: 8 (ref 5–15)
BUN: 24 mg/dL — ABNORMAL HIGH (ref 8–23)
CO2: 22 mmol/L (ref 22–32)
CREATININE: 0.85 mg/dL (ref 0.44–1.00)
Calcium: 8 mg/dL — ABNORMAL LOW (ref 8.9–10.3)
Chloride: 112 mmol/L — ABNORMAL HIGH (ref 98–111)
GFR calc Af Amer: >60 mL/min (ref >60)
GFR calc non Af Amer: >60 mL/min (ref >60)
Glucose, Bld: 97 mg/dL (ref 70–99)
Potassium: 3.8 mmol/L (ref 3.5–5.1)
Sodium: 142 mmol/L (ref 135–145)

## 2018-05-19 LAB — CBC
HCT: 27.3 % — ABNORMAL LOW (ref 36.0–46.0)
Hemoglobin: 8.1 g/dL — ABNORMAL LOW (ref 12.0–15.0)
MCH: 29.9 pg (ref 26.0–34.0)
MCHC: 29.7 g/dL — ABNORMAL LOW (ref 30.0–36.0)
MCV: 100.7 fL — ABNORMAL HIGH (ref 80.0–100.0)
Platelets: 433 10*3/uL — ABNORMAL HIGH (ref 150–400)
RBC: 2.71 MIL/uL — ABNORMAL LOW (ref 3.87–5.11)
RDW: 22.2 % — ABNORMAL HIGH (ref 11.5–15.5)
WBC: 7.5 10*3/uL (ref 4.0–10.5)
nRBC: 0.7 % — ABNORMAL HIGH (ref 0.0–0.2)

## 2018-05-19 LAB — VANCOMYCIN, TROUGH: VANCOMYCIN TR: 22 ug/mL — AB (ref 15–20)

## 2018-05-19 MED ORDER — CHLORHEXIDINE GLUCONATE 4 % EX LIQD
CUTANEOUS | Status: DC | PRN
  Administered 2018-05-19: 1 via TOPICAL

## 2018-05-19 MED ORDER — LIDOCAINE HCL 1 % IJ SOLN
INTRAMUSCULAR | Status: DC | PRN
  Administered 2018-05-19: 10 mL

## 2018-05-19 MED ORDER — HEPARIN SOD (PORK) LOCK FLUSH 100 UNIT/ML IV SOLN
INTRAVENOUS | Status: AC
  Filled 2018-05-19: qty 5

## 2018-05-19 MED ORDER — HEPARIN SOD (PORK) LOCK FLUSH 100 UNIT/ML IV SOLN
INTRAVENOUS | Status: AC
  Administered 2018-05-19: 500 [IU] via NASAL
  Filled 2018-05-19: qty 5

## 2018-05-19 MED ORDER — CHLORHEXIDINE GLUCONATE 4 % EX LIQD
CUTANEOUS | Status: AC
  Filled 2018-05-19: qty 15

## 2018-05-19 NOTE — Procedures (Signed)
PROCEDURE SUMMARY:  Successful exchange of dual lumen PICC line to left basilic vein. Length 46 cm Tip at lower SVC/RA No complications. Flushed and capped. Ready for use. EBL trace.  Please see full dictation in Imaging section for details.   Ulyess Muto S Delmar Dondero PA-C 05/19/2018 1:50 PM

## 2018-05-20 ENCOUNTER — Other Ambulatory Visit (HOSPITAL_COMMUNITY)
Admission: RE | Admit: 2018-05-20 | Discharge: 2018-05-20 | Disposition: A | Discharge status: Home / Self Care | Payer: Medicare Other | Source: Skilled Nursing Facility | Attending: Internal Medicine | Admitting: Internal Medicine

## 2018-05-20 ENCOUNTER — Other Ambulatory Visit: Payer: Self-pay | Admitting: *Deleted

## 2018-05-20 DIAGNOSIS — I82621 Acute embolism and thrombosis of deep veins of right upper extremity: Secondary | ICD-10-CM | POA: Insufficient documentation

## 2018-05-20 LAB — PROTIME-INR
INR: 1.98
Prothrombin Time: 22.2 seconds — ABNORMAL HIGH (ref 11.4–15.2)

## 2018-05-20 NOTE — Patient Outreach (Signed)
[name]) Care Management  05/20/2018  [name] June 18, 1945 [id number]  Onsite IDT meeting Patient will have IV antibiotics until 06/07/18. She has an open abd wound with a wound vac. Patient is married and will go home at discharge.   Plan to monitor and assess for any [facility] care management needs.  Royetta Crochet. Laymond Purser, RN, BSN, [place] [phone]) Business Cell  [phone]) Toll Free Office

## 2018-05-21 ENCOUNTER — Encounter (HOSPITAL_COMMUNITY)
Admission: AD | Admit: 2018-05-21 | Discharge: 2018-05-21 | Disposition: A | Discharge status: Home / Self Care | Payer: Medicare Other | Source: Skilled Nursing Facility | Attending: Internal Medicine | Admitting: Internal Medicine

## 2018-05-21 DIAGNOSIS — A419 Sepsis, unspecified organism: Secondary | ICD-10-CM | POA: Insufficient documentation

## 2018-05-21 DIAGNOSIS — Z1622 Resistance to vancomycin related antibiotics: Secondary | ICD-10-CM | POA: Insufficient documentation

## 2018-05-21 DIAGNOSIS — Z432 Encounter for attention to ileostomy: Secondary | ICD-10-CM | POA: Insufficient documentation

## 2018-05-21 DIAGNOSIS — Z978 Presence of other specified devices: Secondary | ICD-10-CM | POA: Insufficient documentation

## 2018-05-21 DIAGNOSIS — Z48815 Encounter for surgical aftercare following surgery on the digestive system: Secondary | ICD-10-CM | POA: Insufficient documentation

## 2018-05-21 LAB — BASIC METABOLIC PANEL
Anion gap: 8 (ref 5–15)
BUN: 18 mg/dL (ref 8–23)
CHLORIDE: 112 mmol/L — AB (ref 98–111)
CO2: 22 mmol/L (ref 22–32)
Calcium: 8 mg/dL — ABNORMAL LOW (ref 8.9–10.3)
Creatinine, Ser: 0.78 mg/dL (ref 0.44–1.00)
GFR calc non Af Amer: >60 mL/min (ref >60)
Glucose, Bld: 119 mg/dL — ABNORMAL HIGH (ref 70–99)
Potassium: 3.4 mmol/L — ABNORMAL LOW (ref 3.5–5.1)
Sodium: 142 mmol/L (ref 135–145)

## 2018-05-21 LAB — VANCOMYCIN, TROUGH: Vancomycin Tr: 14 ug/mL — ABNORMAL LOW (ref 15–20)

## 2018-05-22 ENCOUNTER — Encounter: Payer: Self-pay | Admitting: Internal Medicine

## 2018-05-22 ENCOUNTER — Non-Acute Institutional Stay (SKILLED_NURSING_FACILITY): Payer: Medicare Other | Admitting: Internal Medicine

## 2018-05-22 DIAGNOSIS — N183 Chronic kidney disease, stage 3 unspecified: Secondary | ICD-10-CM

## 2018-05-22 DIAGNOSIS — I10 Essential (primary) hypertension: Secondary | ICD-10-CM | POA: Diagnosis not present

## 2018-05-22 DIAGNOSIS — K5720 Diverticulitis of large intestine with perforation and abscess without bleeding: Secondary | ICD-10-CM

## 2018-05-22 DIAGNOSIS — E1169 Type 2 diabetes mellitus with other specified complication: Secondary | ICD-10-CM | POA: Diagnosis not present

## 2018-05-22 NOTE — Progress Notes (Signed)
Location:    [facility] Room Number: 144/P Place of Service:  SNF [phone]) Provider: Veleta Miners MD  [name], MD  Patient Care Team: [name], MD as PCP - General (Internal Medicine)  Extended Emergency Contact Information Primary Emergency Contact: Reader,Robert Address: [address]           [place], [place] [address] Johnnette Litter of [place] Phone: [phone] Mobile Phone: [phone] Relation: Spouse  Code Status:  Full Code Goals of care: Advanced Directive information Advanced Directives 05/22/2018  Does Patient Have a Medical Advance Directive? Yes  Type of Advance Directive (No Data)  Does patient want to make changes to medical advance directive? No - Patient declined  Would patient like information on creating a medical advance directive? No - Patient declined  Pre-existing out of facility DNR order (yellow form or pink MOST form) -     Chief Complaint  Patient presents with  . Acute Visit    F/U Visit    HPI:  Pt is a 72 y.o. female seen today for an acute visit for Follow up .  Patient is in the SNF for therapy and IV antibiotics Patient stayed in the hospital from 10/26-12/03 For Perforated Sigmoid Diverticulitis with Multiple abscess.  Patient has h/o Hypertension, Type 2 Diabetes, CKD, rheumatoid arthritis,CAD, s/p CABG, Hyperlipidemia, PVD  Patient was initially admitted to [facility] for Abdominal pain and was Found to have Sigmoid Diverticulitis . She was treated with IV antibiotics. But her Pain worsen and she was found to have Perforation of Sigmoid Diverticulitis. She was started on IV Meropenem. But repeat CT scan showed worsening Diverticulitis with multiple Abdominal Fluids collections. She was transferred to [facility] for IR to Place drains. Which was done on 11/06. But her repeat CT scans showed Persistent Fluid Collections in her abdomen inspite of Long Term Antibiotics and Drains. She was taken for Exploratory  Laparotomy on 11/20. Went through Extensive Lysis of Adhesions, Loop Ileostomy, Drainage of Multiple abdominal abscesses..with Open Surgical wound and VAC placement  She was also started on Diflucan and continued on Meropenum. She was seen by ID and they changed her to Zosyn, Vanco and Diflucan to cover for her Cultures. She is suppose to be on that for 4 weeks. Her 1 drain in LUQ is draining Feculent Material worrisome  For Fistula.  She also Developed Right UE DVT due to PICC line and is nopw on Lovenox bridge with Coumadin. She also had anemia receiving 1 unit of PRBC on 11/27 She was also started on Lyrica for her Fibromyalgia  Patient doing well in facility though her Progress seems to be Slow. She is staying afebrile.  Denies any abdominal pain.  She is working with therapy and now is able to walk few steps with a walker.  Her appetite is good.  She continues to be on protein supplement.  She was seen by Dr. Georgette Dover and has wound Vac for her abdominal Incision.  Past Medical History:  Diagnosis Date  . Anginal pain [facility])    sees Dr Einar Gip.   . Arthritis    rheumatoid ..   . Diabetes mellitus without complication (HCC)    Metformin 2.3.2017  . Diverticulitis   . Diverticulitis of large intestine with perforation and abscess 04/05/2018  . Dysrhythmia   . Fibromyalgia   . GERD (gastroesophageal reflux disease)   . High cholesterol   . Hypertension   . Liver disease 08- 2012   per Dr  Avva pt has enlarged liver  . Peripheral vascular disease (HCC)    legs  . Pneumonia 06/17/2016  . Sleep apnea    sleep study  oct 2012  . Stroke [facility]) 2011   [facility] [place]     Past Surgical History:  Procedure Laterality Date  . [place]  . CHOLECYSTECTOMY  1996  . COLONOSCOPY N/A 02/13/2018   Procedure: COLONOSCOPY;  Surgeon: Rogene Houston, MD;  [facility];  Service: Endoscopy;  Laterality: N/A;  8:30  . CORONARY ARTERY BYPASS GRAFT  1986  . ESOPHAGEAL  DILATION N/A 01/13/2018   Procedure: ESOPHAGEAL DILATION;  Surgeon: Rogene Houston, MD;  [facility];  Service: Endoscopy;  Laterality: N/A;  . ESOPHAGOGASTRODUODENOSCOPY N/A 01/13/2018   Procedure: ESOPHAGOGASTRODUODENOSCOPY (EGD);  Surgeon: Rogene Houston, MD;  [facility];  Service: Endoscopy;  Laterality: N/A;  . EYE SURGERY  2013   cat ext bilateral .  . EYE SURGERY  2002   laser  . FOREIGN BODY REMOVAL  02/13/2018   Procedure: FOREIGN BODY REMOVAL;  Surgeon: Rogene Houston, MD;  [facility];  Service: Endoscopy;;  foreign body removal which appears to be food debri in a stem form removed from colon by DR. Rehman  . [place]- 2007   rod right leg - right wrist plate  . GAS/FLUID EXCHANGE  03/25/2012   Procedure: GAS/FLUID EXCHANGE;  Surgeon: Hayden Pedro, MD;  Location: [facility];  Service: Ophthalmology;  Laterality: Right;  . LAPAROTOMY N/A 04/30/2018   Procedure: EXPLORATORY LAPAROTOMY  LYSIS OF ADHESIONS SMALL BOWEL RESECTION WITH ANASTOMSIS LOOP ILEOSTOMY PLACEMENT OF DRAINS AND WOUND [place];  Surgeon: Donnie Mesa, MD;  Location: [facility];  Service: General;  Laterality: N/A;  . PARS PLANA VITRECTOMY  03/25/2012   Procedure: PARS PLANA VITRECTOMY WITH 25 GAUGE;  Surgeon: Hayden Pedro, MD;  Location: [facility];  Service: Ophthalmology;  Laterality: Right;    Allergies  Allergen Reactions  . Lactose Intolerance (Gi) Other (See Comments)    G.I. Upset  . Butrans [Buprenorphine] Rash and Other (See Comments)    Infected skin underneath application  . Penicillins Rash    Facial rash Has patient had a PCN reaction causing immediate rash, facial/tongue/throat swelling, SOB or lightheadedness with hypotension: Yes Has patient had a PCN reaction causing severe rash involving mucus membranes or skin necrosis: No Has patient had a PCN reaction that required hospitalization No Has patient had a PCN reaction occurring within the last 10 years:  Yes If all of the above answers are "NO", then may proceed with Cephalosporin use.   . Simvastatin Rash    Outpatient Encounter Medications as of 05/22/2018  Medication Sig  . Blood Glucose Monitoring Suppl (FREESTYLE LITE) DEVI See admin instructions.  . clopidogrel (PLAVIX) 75 MG tablet Take 1 tablet (75 mg total) by mouth daily with breakfast.  . Dexlansoprazole 30 MG capsule Take 30 mg by mouth daily.   . diclofenac sodium (VOLTAREN) 1 % GEL Apply 2 g topically 4 (four) times daily as needed (Pain).   Marland Kitchen diltiazem (CARDIZEM CD) 120 MG 24 hr capsule Take 1 capsule (120 mg total) by mouth daily.  . ferrous sulfate 325 (65 FE) MG tablet Take 1 tablet (325 mg total) by mouth 2 (two) times daily with a meal.  . fluconazole (DIFLUCAN) 200 MG tablet Take 2 tablets (400 mg total) by mouth daily.  Marland Kitchen HYDROcodone-acetaminophen ([place])  10-325 MG tablet Take 1 tablet by mouth 4 (four) times daily.  . insulin aspart (NOVOLOG FLEXPEN) 100 UNIT/ML FlexPen Give per sliding scale 2 times a day, 140-199 - 2 units, 200-250 - 4 units, 251-299 - 6 units,  300-349 - 8 units,  350 or above 10 units. Dispense syringes and needles as needed, Ok to switch to PEN if approved. Substitute to any brand approved. DX DM2, Code E11.65  . isosorbide dinitrate (ISORDIL) 5 MG tablet Take 1 tablet (5 mg total) by mouth 3 (three) times daily before meals.  Marland Kitchen leflunomide (ARAVA) 20 MG tablet Take 20 mg by mouth daily.  . magnesium oxide (MAG-OX) 400 MG tablet Take 400 mg by mouth 2 (two) times daily. For 7 days from 05/16/2018-05/22/2018  . metoprolol succinate (TOPROL-XL) 25 MG 24 hr tablet Take 25 mg by mouth daily.  . piperacillin-tazobactam (ZOSYN) IVPB Inject 3.375 g into the vein every 6 (six) hours. Indication:  Intraabdominal abscess Last Day of Therapy:  06/07/18 Labs - Once weekly:  CBC/D and BMP, Labs - Every other week:  ESR and CRP  . potassium chloride SA (K-DUR,KLOR-CON) 20 MEQ tablet Take 20 mEq by mouth daily.    . pravastatin (PRAVACHOL) 40 MG tablet Take 40 mg by mouth at bedtime.  . predniSONE (DELTASONE) 5 MG tablet Take 5 mg by mouth daily with breakfast.  . pregabalin (LYRICA) 50 MG capsule Take 50 mg by mouth 3 (three) times daily.  . Tafluprost (ZIOPTAN) 0.0015 % SOLN Place 1 drop into both eyes at bedtime.   . temazepam (RESTORIL) 30 MG capsule Take 1 capsule (30 mg total) by mouth at bedtime.  . timolol (BETIMOL) 0.5 % ophthalmic solution Place 1 drop into both eyes 2 (two) times daily.   . Tofacitinib Citrate (XELJANZ) 5 MG TABS Take 1 tablet by mouth daily.  . vancomycin 750 mg in sodium chloride 0.9 % 150 mL Inject 750 mg into the vein daily. 1 bag  . VITAMIN D, ERGOCALCIFEROL, PO Take 50,000 Units by mouth once a week. Thursdays  . warfarin (COUMADIN) 2 MG tablet Take 1 tablet (2 mg total) by mouth daily.  . [DISCONTINUED] docusate sodium (COLACE) 100 MG capsule Take 2 capsules (200 mg total) by mouth at bedtime.  . [DISCONTINUED] enoxaparin (LOVENOX) 120 MG/0.8ML injection Inject 0.73 mLs (110 mg total) into the skin every 12 (twelve) hours.  . [DISCONTINUED] insulin aspart (NOVOLOG) 100 UNIT/ML injection Before each meal 3 times a day, 140-199 - 2 units, 200-250 - 4 units, 251-299 - 6 units,  300-349 - 8 units,  350 or above 10 units. Dispense syringes and needles as needed, Ok to switch to PEN if approved. Substitute to any brand approved. DX DM2, Code E11.65 (Patient taking differently: Before each meal 2 times a day, 140-199 - 2 units, 200-250 - 4 units, 251-299 - 6 units,  300-349 - 8 units,  350 or above 10 units. Dispense syringes and needles as needed, Ok to switch to PEN if approved. Substitute to any brand approved. DX DM2, Code E11.65)  . [DISCONTINUED] pregabalin (LYRICA) 75 MG capsule Take 1 capsule (75 mg total) by mouth 3 (three) times daily.  . [DISCONTINUED] vancomycin IVPB Inject 1,250 mg into the vein every 12 (twelve) hours. Indication:  Intraabdominal abscess Last Day of  Therapy:  06/07/18 Labs - Sunday/Monday:  CBC/D, BMP, and vancomycin trough. Labs - Thursday:  BMP and vancomycin trough Labs - Every other week:  ESR and CRP   No  facility-administered encounter medications on file as of 05/22/2018.      Review of Systems  Constitutional: Negative.  Negative for activity change and appetite change.  HENT: Negative.   Respiratory: Negative for cough and shortness of breath.   Cardiovascular: Negative for chest pain.  Gastrointestinal: Negative.   Genitourinary: Negative.   Musculoskeletal: Positive for arthralgias.  Skin: Positive for wound.  Neurological: Positive for weakness.  Psychiatric/Behavioral: Negative.     Immunization History  Administered Date(s) Administered  . Influenza Split 03/26/2012, 03/11/2013  . Influenza, High Dose Seasonal PF 04/07/2018   Pertinent  Health Maintenance Due  Topic Date Due  . FOOT EXAM  06/14/2018 (Originally 08/14/1955)  . MAMMOGRAM  06/14/2018 (Originally 11/17/2016)  . OPHTHALMOLOGY EXAM  06/14/2018 (Originally 08/14/1955)  . URINE MICROALBUMIN  06/14/2018 (Originally 08/14/1955)  . DEXA SCAN  06/14/2018 (Originally 08/13/2009)  . PNA vac Low Risk Adult (1 of 2 - PCV13) 06/14/2018 (Originally 08/13/2009)  . HEMOGLOBIN A1C  10/07/2018  . COLONOSCOPY  02/14/2028  . INFLUENZA VACCINE  Completed   No flowsheet data found. Functional Status Survey:    Vitals:   05/22/18 1043  BP: 137/84  Pulse: 83  Resp: 20  Temp: (!) 97.5 F (36.4 C)  TempSrc: Oral   There is no height or weight on file to calculate BMI. Physical Exam Constitutional:      Appearance: She is well-developed.  HENT:     Head: Normocephalic.  Eyes:     Pupils: Pupils are equal, round, and reactive to light.  Neck:     Musculoskeletal: Neck supple.  Cardiovascular:     Rate and Rhythm: Normal rate and regular rhythm.     Heart sounds: No murmur.  Pulmonary:     Effort: Pulmonary effort is normal. No respiratory distress.      Breath sounds: Normal breath sounds. No stridor. No wheezing.  Abdominal:     Palpations: Abdomen is soft.     Comments: Has open midline wound . 2 Drains and Ileostomy Bag  Skin:    General: Skin is warm and dry.  Neurological:     Mental Status: She is alert and oriented to person, place, and time.     Comments: No Focal Deficits  Psychiatric:        Behavior: Behavior normal.        Thought Content: Thought content normal.     Labs reviewed: Recent Labs    05/06/18 0406 05/07/18 0348  05/09/18 0429  05/11/18 0321 05/13/18 0412 05/14/18 0615 05/15/18 0815 05/19/18 2030 05/21/18 0700  NA 143 141   < > 136   < > 136 137 137  --  142 142  K 4.0 3.9   < > 3.3*   < > 3.6 3.7 3.7  --  3.8 3.4*  CL 102 101   < > 101   < > 99 103 106  --  112* 112*  CO2 31 30   < > 28   < > 26 23 22   --  22 22  GLUCOSE 113* 121*   < > 90   < > 88 85 106*  --  97 119*  BUN 50* 48*   < > 32*   < > 23 16 15   --  24* 18  CREATININE 1.14* 1.12*   < > 1.00   < > 0.93 0.92 0.70  --  0.85 0.78  CALCIUM 8.1* 7.9*   < > 7.8*   < > 7.8*  7.9* 7.9*  --  8.0* 8.0*  MG 1.8 1.5*   < > 1.8   < > 2.1 1.5*  --  1.4*  --   --   PHOS 5.7* 4.7*  --  3.6  --   --   --   --   --   --   --    < > = values in this interval not displayed.   Recent Labs    05/05/18 0351 05/07/18 0348 05/13/18 0412  AST 64* 44* 22  ALT 47* 42 16  ALKPHOS 93 101 81  BILITOT 0.4 0.6 0.6  PROT 4.9* 5.0* 4.9*  ALBUMIN 1.7* 1.8* 1.6*   Recent Labs    04/28/18 0305  05/05/18 0351  05/13/18 0412 05/14/18 0615 05/19/18 2030  WBC 10.2   < > 13.1*   < > 10.5 10.0 7.5  NEUTROABS 7.7  --  10.7*  --   --  7.3  --   HGB 7.7*   < > 8.0*   < > 7.5* 8.2* 8.1*  HCT 24.9*   < > 24.8*   < > 24.4* 26.3* 27.3*  MCV 93.6   < > 91.5   < > 94.9 97.8 100.7*  PLT 348   < > 262   < > 368 380 433*   < > = values in this interval not displayed.   Lab Results  Component Value Date   TSH 7.944 (H) 05/07/2018   Lab Results  Component Value Date     HGBA1C 5.9 (H) 04/07/2018   Lab Results  Component Value Date   CHOL 145 06/21/2013   HDL 45 06/21/2013   LDLCALC 65 06/21/2013   TRIG 185 (H) 05/05/2018   CHOLHDL 3.2 06/21/2013    Significant Diagnostic Results in last 30 days:  Ct Abdomen Pelvis Wo Contrast  Result Date: 04/27/2018 CLINICAL DATA:  Re-evaluate diverticulitis with abscess drainage. EXAM: CT ABDOMEN AND PELVIS WITHOUT CONTRAST TECHNIQUE: Multidetector CT imaging of the abdomen and pelvis was performed following the standard protocol without IV contrast. COMPARISON:  CT scan April 23, 2018 FINDINGS: Lower chest: No acute abnormality. Hepatobiliary: No focal liver abnormality is seen. Status post cholecystectomy. No biliary dilatation. Pancreas: Unremarkable. No pancreatic ductal dilatation or surrounding inflammatory changes. Spleen: Normal in size without focal abnormality. Adrenals/Urinary Tract: There is a tiny nonobstructive stone in the lower pole the right kidney. No hydronephrosis or perinephric stranding. No ureteral stones. The bladder is normal. The adrenal glands are normal. Stomach/Bowel: The stomach is normal. There is a duodenal diverticulum off the distal third portion of the duodenum. The remainder of the small bowel is unremarkable. Colonic diverticuli are noted. A segment of sigmoid colon remains thickened. There is mild increased attenuation in the adjacent fat and some low level remaining diverticulitis is not excluded. Remainder of the colon is normal. The appendix is best seen on coronal images 52 through 57. The appendix is located just superior to a right lower quadrant drain. There is increased attenuation in the fat of the abdomen but it does not appear to arise from the appendix. Vascular/Lymphatic: Atherosclerotic changes are seen in the abdominal aorta. No adenopathy. Reproductive: Status post hysterectomy. No adnexal masses. Other: There are 3 abscess drainage catheters. The first is seen in the  pelvis on axial image 73 with no surrounding fluid. The second is seen in the upper central pelvis on axial image 59 with no surrounding drainable fluid collection. The third is seen in the right  side of the pelvis on image 60. The previously identified fluid and air collection in this region has resolved. The mild fat stranding in this region is thought to be due to from the drainage catheter and recently drained abscess and not arising from the adjacent appendix. Recommend attention on follow-up. There is remaining fluid in the abdomen and pelvis. There is a small pocket of fluid measuring up to 2.8 cm on axial image 43. The distal duodenum courses through this fluid. There is another pocket of fluid in the left lower quadrant of the abdomen seen on axial image 52 measuring 4.8 x 3.7 cm. There is a small extension office fluid collection medially which abuts the drainage catheter. However, this fluid collection is clearly not being drained. Musculoskeletal: No acute or significant osseous findings. IMPRESSION: 1. There are 3 drainage catheters in the lower abdomen and pelvis. No significant fluid around any of the drainage catheters. However, there are 2 remaining fluid collections/abscesses. The first is in the region of the distal third portion of the duodenum measuring up to 2.8 cm. Duodenum appears to run through or immediately adjacent to this fluid. Another pocket of fluid is seen in the left lower quadrant of the abdomen measuring 4.8 x 3.7 cm. 2. Nonobstructive stone in the lower pole the right kidney. 3. Thickening remains in the sigmoid colon. Low-grade remaining inflammation of the sigmoid colon from diverticulitis is not excluded. 4. There is fat stranding in the region of the appendix. However, there is a adjacent drainage catheter and I suspect the stranding is secondary to the recent abscess in this region. Recommend clinical correlation and attention on follow-up. Electronically Signed   By: Dorise Bullion III M.D   On: 04/27/2018 22:51   Ct Abdomen Pelvis Wo Contrast  Result Date: 04/22/2018 CLINICAL DATA:  Follow-up diverticulitis EXAM: CT ABDOMEN AND PELVIS WITHOUT CONTRAST TECHNIQUE: Multidetector CT imaging of the abdomen and pelvis was performed following the standard protocol without IV contrast. Sagittal and coronal MPR images reconstructed from axial data set. Patient drank dilute oral contrast for exam COMPARISON:  04/15/2018 FINDINGS: Lower chest: Bibasilar atelectasis and tiny pleural effusions. Calcified granulomata at lung bases. Hepatobiliary: Gallbladder surgically absent. No focal hepatic abnormalities. Pancreas: Normal appearance Spleen: Normal appearance Adrenals/Urinary Tract: Adrenal glands normal appearance. Tiny nonobstructing calculus at inferior pole LEFT kidney. Kidneys, ureters, and bladder otherwise normal appearance. Stomach/Bowel: Stomach under distended with suboptimal assessment of wall thickness. Diffuse diverticulosis of the transverse, descending, and sigmoid colon. Persistent wall thickening of the sigmoid colon. Wall thickening also identified at the cecum extending to the ileocecal valve, slightly increased since previous study. No evidence of bowel obstruction. Small bowel loops unremarkable. Vascular/Lymphatic: Atherosclerotic calcifications aorta and iliac arteries. No adenopathy Reproductive: Uterus unremarkable.  Ovaries poorly visualized. Other: Pigtail drainage catheter in pelvis with minimal residual fluid at the previously identified large pelvic abscess which was drained. Pigtail drainage catheter within the mesentery in the mid abdomen with minimal residual fluid within the previously seen abscess collection. This residual fluid extends to a second persistent collection of fluid within the mesentery adjacent to a small bowel loop, consistent with residual abscess, 4.4 x 3.5 cm previously 5.1 x 3.6 cm. Additional large gas and minimal fluid containing  collection in the RIGHT pelvis medial and inferior to the cecum measures 8.2 x 5.0 x 6.8 cm previously 8.2 x 5.9 x 8.0 cm. No new abscess collections. No free intraperitoneal air. Stranding of tissue planes in the pelvis again identified  with minimal fluid at the LEFT pericolic gutter and perisplenic. Musculoskeletal: No acute osseous findings. IMPRESSION: Interval resolution of large pelvic abscess collection post drainage. Minimal residual mesenteric abscess collection post drainage though the minimal residual fluid appears to communicate with a second collection in the mesentery in the LEFT mid abdomen adjacent to a small bowel loop measuring 4.4 x 3.5 cm slightly decreased since previous exam. Persistent large extraluminal gas collection in the RIGHT pelvis 8.2 x 5.0 x 6.8 cm, only slightly decreased. Diffuse colonic diverticulosis with persistent wall thickening of the sigmoid colon. Electronically Signed   By: Lavonia Dana M.D.   On: 04/22/2018 13:05   Ct Abdomen Pelvis W Contrast  Result Date: 05/07/2018 CLINICAL DATA:  Abdominal pain, fever, abscess suspected. Partial bowel obstruction 8 days ago. EXAM: CT ABDOMEN AND PELVIS WITH CONTRAST TECHNIQUE: Multidetector CT imaging of the abdomen and pelvis was performed using the standard protocol following bolus administration of intravenous contrast. CONTRAST:  127m OMNIPAQUE IOHEXOL 300 MG/ML  SOLN COMPARISON:  CTA abdomen pelvis 04/27/2018 FINDINGS: Lower chest: Dependent airspace disease is worse right than left. Calcified granulomas are present at the left base. No other significant airspace disease is present. Heart size is normal. No significant pleural or pericardial effusion is present. Hepatobiliary: There is diffuse fatty infiltration of liver. Cholecystectomy is noted. No focal hepatic lesions are present. The common bile duct is within normal limits. Pancreas: The pancreas is atrophic. No discrete lesions are present Spleen: Normal in size  without focal abnormality. Adrenals/Urinary Tract: Adrenal glands are normal bilaterally. A 3 mm nonobstructing stone is stable at the lower pole of the right kidney. No other significant stones are present. There is no mass lesion. Minimal fluid is present about both kidneys. Ureters are within normal limits. The urinary bladder is within normal limits. Stomach/Bowel: The stomach and duodenum are within normal limits. The proximal small bowel demonstrates some inflammatory change. There is fluid within the small bowel mesentery in some wall thickening. Right lower quadrant ileostomy demonstrates contrast material. No obstruction is present. The terminal ileum is decompressed. Cecum and ascending colon are mostly decompressed. There are some diverticular changes within the transverse colon. Additional diverticular changes are present in the descending and sigmoid colon. Sigmoid colon is mostly collapsed. A pigtail drainage catheter in the right lower quadrant is stable. There is no significant fluid collection associated. A straight drain extends into the anatomic pelvis via a left lower quadrant approach. Enlarging fluid collection along the lateral peritoneal wall and the left lower quadrant now measures 5.2 x 2.0 x 2.6 cm. A fluid collection along the distal duodenum is increased in size, now measuring 3.5 x 2.2 x 5.0 cm. The collection along the pharynx limb of the loop ileostomy in the right lower quadrant measures 2.0 by 3.2 x 7.1 cm. There is some free fluid without other focal collections. Vascular/Lymphatic: Atherosclerotic calcifications are present in the aorta and branch vessels without aneurysm. No significant adenopathy is present. Reproductive: Uterus and bilateral adnexa are unremarkable. Other: Laparotomy wound is healing by secondary intention. The deep portion of the wound is closed. No significant ventral hernias are present. Fluid is described above. Musculoskeletal: Degenerative anterolisthesis  is again noted at L4-5. Multilevel facet degenerative changes are noted. Vertebral body heights are normal. Hips are located and within normal limits. Pelvis is unremarkable. IMPRESSION: 1. 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 Diverticular changes throughout decompressed bowel without definite focal inflammation. There is some wall thickening in the  sigmoid colon. 2. Existing drainage catheters are not associated with any significant residual fluid collections. 3. Three separate fluid collections are increasing, as described. 4.  Aortic Atherosclerosis (ICD10-I70.0). 5. Bibasilar airspace disease is likely atelectasis. Electronically Signed   By: San Morelle M.D.   On: 05/07/2018 13:54   Ct Image Guided Drainage By Percutaneous Catheter  Result Date: 04/24/2018 CLINICAL DATA:  Diverticular abscess EXAM: CT GUIDED DRAINAGE OF PELVIC ABSCESS ANESTHESIA/SEDATION: Intravenous Fentanyl and Versed were administered as conscious sedation during continuous monitoring of the patient's level of consciousness and physiological / cardiorespiratory status by the radiology RN, with a total moderate sedation time of 17 minutes. PROCEDURE: The procedure, risks, benefits, and alternatives were explained to the patient. Questions regarding the procedure were encouraged and answered. The patient understands and consents to the procedure. Select axial scans through the pelvis were obtained. The right lower quadrant collection was localized, an appropriate skin entry site and trajectory identified, and the skin marked. The operative field was prepped with chlorhexidinein a sterile fashion, and a sterile drape was applied covering the operative field. A sterile gown and sterile gloves were used for the procedure. Local anesthesia was provided with 1% Lidocaine. Under CT fluoroscopic guidance, an 18 gauge trocar needle was advanced into the collection. Gas and fluid returned through the needle hub.  Amplatz guidewire advanced easily into the collection, confirmed on CT. Tract dilated to facilitate placement of a 12 French pigtail catheter, formed centrally within the collection. Position confirmed on CT. 5 mL of greenish feculent aspirate were sent for Gram stain and culture. The catheter was secured externally with 0 Prolene suture and StatLock and placed to gravity drain bag. The patient tolerated the procedure well. COMPLICATIONS: None immediate FINDINGS: Loculated right lower quadrant gas and fluid collection was localized. 12 French pigtail drain catheter placed as above. IMPRESSION: 1. Technically successful CT-guided placement of right lower quadrant abscess drain catheter. Electronically Signed   By: Lucrezia Europe M.D.   On: 04/24/2018 08:28   Vas [place] Upper Extremity Venous Duplex  Result Date: 05/08/2018 UPPER VENOUS STUDY  Indications: Follow up of DVT found in one of the brachial veins 04/15/18 Performing Technologist: Sharion Dove RVS  Examination Guidelines: A complete evaluation includes B-mode imaging, spectral Doppler, color Doppler, and power Doppler as needed of all accessible portions of each vessel. Bilateral testing is considered an integral part of a complete examination. Limited examinations for reoccurring indications may be performed as noted.  Right Findings: +----------+------------+----------+---------+-----------+-----------------+ RIGHT     CompressiblePropertiesPhasicitySpontaneous     Summary      +----------+------------+----------+---------+-----------+-----------------+ IJV           Full                 Yes       Yes                      +----------+------------+----------+---------+-----------+-----------------+ Subclavian                         Yes       Yes                      +----------+------------+----------+---------+-----------+-----------------+ Axillary      None                                  Age Indeterminate  +----------+------------+----------+---------+-----------+-----------------+  Brachial    Partial                                 Age Indeterminate +----------+------------+----------+---------+-----------+-----------------+ Cephalic      Full                                                    +----------+------------+----------+---------+-----------+-----------------+ Basilic       Full                                                    +----------+------------+----------+---------+-----------+-----------------+  Left Findings: +----------+------------+----------+---------+-----------+-------+ LEFT      CompressiblePropertiesPhasicitySpontaneousSummary +----------+------------+----------+---------+-----------+-------+ Subclavian                         Yes       Yes            +----------+------------+----------+---------+-----------+-------+  Summary:  Right: Findings consistent with age indeterminate deep vein thrombosis involving the right brachial veins and right axillary vein. DVT found in one of the brachial veins, 04/15/18, remains. DVT appears to have propagated in to the axillary vein.  Left: No evidence of thrombosis in the subclavian.  *See table(s) above for measurements and observations.  Diagnosing physician: Servando Snare MD Electronically signed by Servando Snare MD on 05/08/2018 at 6:00:08 PM.    Final    Ir Picc Replacement Leftinc Imgguide  Result Date: 05/19/2018 INDICATION: Malfunctioning PICC line. Diverticular abscess. Need to continue IV antibiotics. EXAM: FLUOROSCOPIC GUIDED PICC LINE EXCHANGE TECHNIQUE: The procedure, risks, benefits, and alternatives were explained to the patient and informed written consent was obtained. The left upper extremity and external portion of the existing PICC line was prepped with chlorhexidine in a sterile fashion, and a sterile drape was applied covering the operative field. Maximum barrier sterile technique with sterile  gowns and gloves were used for the procedure. A timeout was performed prior to the initiation of the procedure. Local anesthesia was provided with 1% lidocaine. The existing PICC line was cannulated with an 0.0018 wire which was advanced through the catheter. The catheter was exchanged for a peel-away sheath, ultimately allowing advancement of a 46-cm, 5- [place], double lumen PICC line to the level of the superior caval atrial junction. A post procedure spot fluoroscopic image was obtained. The catheter easily aspirated and flushed and was secured in place. A dressing was placed. The patient tolerated the procedure well without immediate post procedural complication. FINDINGS: After catheter exchange, the tip lies within the superior cavoatrial junction. The catheter aspirates and flushes normally, it was capped, and is ready for immediate use. IMPRESSION: Successful fluoroscopic guided exchange of left upper extremity approach 46 cm, 5 - [place], double lumen PICC with tip overlying the superior caval atrial junction. The PICC line is ready for immediate use. Read by: Gareth Eagle, PA-C Electronically Signed   By: Markus Daft M.D.   On: 05/19/2018 13:50   [place] Ekg Site Rite  Result Date: 04/23/2018 If Site Rite image not attached, placement could not be confirmed due to current cardiac rhythm.   Assessment/Plan  Sigmoid Diverticulitis with perforation and Multiple  Abdominal Abscess And open Surgical wound Patient has 2 Drains. With 1 draining Purulent material though per Nurses the drainage amount has decrased. Other did not have any drainage. She is also on Vanco and Zosyn For 4 weeks Last Day 12/28 Oral Diflucan for 4 weeks. Has Wound Vac now for surgical incision Follow up with Surgery.Follow up CT scan was ordered by Dr Georgette Dover Acute Right UE DVT Lovenox is discontinued on Coumadin Dose is adjusted for INR of 2 Type 2 diabetes mellitus  Oral Hypoglycemics stopped in hospital due to decreased  appetite On sliding scale insulin for now Sugars running below 150  CKD (chronic kidney disease) stage 3, Creat Near baseline  Rheumatoid arthritis involving multiple sites, On low dose of Prednisone, On Xelijanz and Leflunomide Hydrocodone PRN for Pain Fibromyalgia Continue Lyrica Dose reduced to 23m TID as patient says 75 Mg makes her Drowzy  Anemia due to Chronic Disease On Iron S/P Transfusion Hgb Stable for now Will also continue Dexilant H/O Dysphagia On low dose of Nitrates  CAD On Plavix and Beta Blocker and statin Deconditioning Encourage PO intake with Supplements Doing well with Therapy    Family/ staff Communication:   Labs/tests ordered:   Total time spent in this patient care encounter was 45_ minutes; greater than 50% of the visit spent counseling patient, reviewing records , Labs and coordinating care for problems addressed at this encounter.

## 2018-05-23 ENCOUNTER — Encounter (HOSPITAL_COMMUNITY)
Admission: RE | Admit: 2018-05-23 | Discharge: 2018-05-23 | Disposition: A | Discharge status: Home / Self Care | Payer: Medicare Other | Source: Skilled Nursing Facility | Attending: Internal Medicine | Admitting: Internal Medicine

## 2018-05-23 DIAGNOSIS — Z432 Encounter for attention to ileostomy: Secondary | ICD-10-CM | POA: Insufficient documentation

## 2018-05-23 DIAGNOSIS — A419 Sepsis, unspecified organism: Secondary | ICD-10-CM | POA: Insufficient documentation

## 2018-05-23 DIAGNOSIS — Z48815 Encounter for surgical aftercare following surgery on the digestive system: Secondary | ICD-10-CM | POA: Insufficient documentation

## 2018-05-23 LAB — BASIC METABOLIC PANEL
Anion gap: 8 (ref 5–15)
BUN: 26 mg/dL — AB (ref 8–23)
CALCIUM: 7.8 mg/dL — AB (ref 8.9–10.3)
CO2: 20 mmol/L — ABNORMAL LOW (ref 22–32)
Chloride: 110 mmol/L (ref 98–111)
Creatinine, Ser: 0.85 mg/dL (ref 0.44–1.00)
GFR calc Af Amer: >60 mL/min (ref >60)
GFR calc non Af Amer: >60 mL/min (ref >60)
Glucose, Bld: 161 mg/dL — ABNORMAL HIGH (ref 70–99)
Potassium: 4.2 mmol/L (ref 3.5–5.1)
Sodium: 138 mmol/L (ref 135–145)

## 2018-05-23 LAB — PROTIME-INR
INR: 2.28
Prothrombin Time: 24.8 seconds — ABNORMAL HIGH (ref 11.4–15.2)

## 2018-05-23 LAB — VANCOMYCIN, TROUGH: Vancomycin Tr: 10 ug/mL — ABNORMAL LOW (ref 15–20)

## 2018-05-26 ENCOUNTER — Encounter (HOSPITAL_COMMUNITY)
Admission: RE | Admit: 2018-05-26 | Discharge: 2018-05-26 | Disposition: A | Discharge status: Home / Self Care | Payer: Medicare Other | Source: Skilled Nursing Facility

## 2018-05-26 ENCOUNTER — Non-Acute Institutional Stay (SKILLED_NURSING_FACILITY): Payer: Medicare Other | Admitting: Internal Medicine

## 2018-05-26 ENCOUNTER — Encounter: Payer: Self-pay | Admitting: Internal Medicine

## 2018-05-26 ENCOUNTER — Other Ambulatory Visit: Payer: Self-pay

## 2018-05-26 DIAGNOSIS — K5720 Diverticulitis of large intestine with perforation and abscess without bleeding: Secondary | ICD-10-CM | POA: Diagnosis not present

## 2018-05-26 DIAGNOSIS — F329 Major depressive disorder, single episode, unspecified: Secondary | ICD-10-CM

## 2018-05-26 DIAGNOSIS — F32A Depression, unspecified: Secondary | ICD-10-CM

## 2018-05-26 DIAGNOSIS — F419 Anxiety disorder, unspecified: Secondary | ICD-10-CM

## 2018-05-26 DIAGNOSIS — E1169 Type 2 diabetes mellitus with other specified complication: Secondary | ICD-10-CM

## 2018-05-26 LAB — BASIC METABOLIC PANEL
Anion gap: 4 — ABNORMAL LOW (ref 5–15)
BUN: 23 mg/dL (ref 8–23)
CO2: 19 mmol/L — ABNORMAL LOW (ref 22–32)
Calcium: 7 mg/dL — ABNORMAL LOW (ref 8.9–10.3)
Chloride: 120 mmol/L — ABNORMAL HIGH (ref 98–111)
Creatinine, Ser: 0.86 mg/dL (ref 0.44–1.00)
GFR calc Af Amer: >60 mL/min (ref >60)
GFR calc non Af Amer: >60 mL/min (ref >60)
GLUCOSE: 144 mg/dL — AB (ref 70–99)
Potassium: 3.5 mmol/L (ref 3.5–5.1)
Sodium: 143 mmol/L (ref 135–145)

## 2018-05-26 LAB — PROTIME-INR
INR: 2.86
Prothrombin Time: 29.6 seconds — ABNORMAL HIGH (ref 11.4–15.2)

## 2018-05-26 LAB — VANCOMYCIN, TROUGH: Vancomycin Tr: 9 ug/mL — ABNORMAL LOW (ref 15–20)

## 2018-05-26 MED ORDER — PREGABALIN 50 MG PO CAPS: 50.0000 mg | ORAL_CAPSULE | Freq: Three times a day (TID) | ORAL | 1 refills | Status: DC

## 2018-05-26 NOTE — Telephone Encounter (Signed)
RX Fax for [facility]@ [phone]  

## 2018-05-26 NOTE — Progress Notes (Signed)
Location:    [place] Room Number: 144/P Place of Service:  SNF [phone]) Provider: Veleta Miners MD  [name], MD  Patient Care Team: [name], MD as PCP - General (Internal Medicine)  Extended Emergency Contact Information Primary Emergency Contact: Moorer,Robert Address: [address]           [place], [place] [address] Johnnette Litter of [place] Phone: [phone] Mobile Phone: [phone] Relation: Spouse  Code Status:  Full Code Goals of care: Advanced Directive information Advanced Directives 05/26/2018  Does Patient Have a Medical Advance Directive? Yes  Type of Advance Directive (No Data)  Does patient want to make changes to medical advance directive? No - Patient declined  Would patient like information on creating a medical advance directive? No - Patient declined  Pre-existing out of facility DNR order (yellow form or pink MOST form) -     Chief Complaint  Patient presents with  . Acute Visit    Anxiety and Depression    HPI:  Pt is a 72 y.o. female seen today for an acute visit for Anxiety and Depression Patient is in the SNF for therapy and IV antibiotics Patient stayed in the hospital from 10/26-12/03 For Perforated Sigmoid Diverticulitis with Multiple abscess.  Patient has h/o Hypertension, Type 2 Diabetes, CKD, rheumatoid arthritis,CAD, s/p CABG, Hyperlipidemia, PVD  Patient was initially admitted to [facility] for Abdominal pain and was Found to have Sigmoid Diverticulitis . She was treated with IV antibiotics. But her Pain worsen and she was found to have Perforation of Sigmoid Diverticulitis. She was started on IV Meropenem. But repeat CT scan showed worsening Diverticulitis with multiple Abdominal Fluids collections. She was transferred to [facility] for IR to Place drains. Which was done on 11/06. But her repeat CT scans showed Persistent Fluid Collections in her abdomen inspite of Long Term Antibiotics and Drains. She was taken for  Exploratory Laparotomy on 11/20. Went through Extensive Lysis of Adhesions, Loop Ileostomy, Drainage of Multiple abdominal abscesses..with Open Surgical wound and VAC placement She was also started on Diflucan and continued on Meropenum. She was seen by ID and they changed her to Zosyn, Vanco and Diflucan to cover for her Cultures. She is suppose to be on that for 4 weeks. Her 1 drain in LUQ is draining Feculent Material worrisome For Fistula.  She also Developed Right UE DVT due to PICC line And is now on Coumadin She also had anemia receiving 1 unit of PRBC on 11/27 She was also started on Lyrica for her Fibromyalgia Patient doing well in facility though her Progress seems to be Slow. She is staying afebrile.  Denies any abdominal pain.  She is working with therapy and now is able to walk few steps with a walker.  Her appetite is good.  She continues to be on protein supplement.  She was seen by Dr. Georgette Dover and has wound Vac for her abdominal Incision. Patient wanted to see me today as she has been having Depression and Anxiety. Her Pet dog at home is not doing well. And she is also depressed due to not been able to go home for Christmas.  Past Medical History:  Diagnosis Date  . Anginal pain [facility])    sees Dr Einar Gip.   . Arthritis    rheumatoid ..   . Diabetes mellitus without complication (HCC)    Metformin 2.3.2017  . Diverticulitis   . Diverticulitis of large intestine with perforation and abscess 04/05/2018  .  Dysrhythmia   . Fibromyalgia   . GERD (gastroesophageal reflux disease)   . High cholesterol   . Hypertension   . Liver disease 08- 2012   per Dr Dagmar Hait pt has enlarged liver  . Peripheral vascular disease (HCC)    legs  . Pneumonia 06/17/2016  . Sleep apnea    sleep study  oct 2012  . Stroke [facility]) 2011   [facility]     Past Surgical History:  Procedure Laterality Date  . [place]  . CHOLECYSTECTOMY  1996  . COLONOSCOPY N/A 02/13/2018    Procedure: COLONOSCOPY;  Surgeon: Rogene Houston, MD;  [facility];  Service: Endoscopy;  Laterality: N/A;  8:30  . CORONARY ARTERY BYPASS GRAFT  1986  . ESOPHAGEAL DILATION N/A 01/13/2018   Procedure: ESOPHAGEAL DILATION;  Surgeon: Rogene Houston, MD;  [facility];  Service: Endoscopy;  Laterality: N/A;  . ESOPHAGOGASTRODUODENOSCOPY N/A 01/13/2018   Procedure: ESOPHAGOGASTRODUODENOSCOPY (EGD);  Surgeon: Rogene Houston, MD;  [facility];  Service: Endoscopy;  Laterality: N/A;  . EYE SURGERY  2013   cat ext bilateral .  . EYE SURGERY  2002   laser  . FOREIGN BODY REMOVAL  02/13/2018   Procedure: FOREIGN BODY REMOVAL;  Surgeon: Rogene Houston, MD;  [facility];  Service: Endoscopy;;  foreign body removal which appears to be food debri in a stem form removed from colon by DR. Rehman  . [place]- 2007   rod right leg - right wrist plate  . GAS/FLUID EXCHANGE  03/25/2012   Procedure: GAS/FLUID EXCHANGE;  Surgeon: Hayden Pedro, MD;  Location: [place];  Service: Ophthalmology;  Laterality: Right;  . LAPAROTOMY N/A 04/30/2018   Procedure: EXPLORATORY LAPAROTOMY  LYSIS OF ADHESIONS SMALL BOWEL RESECTION WITH ANASTOMSIS LOOP ILEOSTOMY PLACEMENT OF DRAINS AND WOUND [place];  Surgeon: Donnie Mesa, MD;  Location: [place];  Service: General;  Laterality: N/A;  . PARS PLANA VITRECTOMY  03/25/2012   Procedure: PARS PLANA VITRECTOMY WITH 25 GAUGE;  Surgeon: Hayden Pedro, MD;  Location: [place];  Service: Ophthalmology;  Laterality: Right;    Allergies  Allergen Reactions  . Lactose Intolerance (Gi) Other (See Comments)    G.I. Upset  . Butrans [Buprenorphine] Rash and Other (See Comments)    Infected skin underneath application  . Penicillins Rash    Facial rash Has patient had a PCN reaction causing immediate rash, facial/tongue/throat swelling, SOB or lightheadedness with hypotension: Yes Has patient had a PCN reaction causing severe  rash involving mucus membranes or skin necrosis: No Has patient had a PCN reaction that required hospitalization No Has patient had a PCN reaction occurring within the last 10 years: Yes If all of the above answers are "NO", then may proceed with Cephalosporin use.   . Simvastatin Rash    Outpatient Encounter Medications as of 05/26/2018  Medication Sig  . Blood Glucose Monitoring Suppl (FREESTYLE LITE) DEVI See admin instructions.  . clopidogrel (PLAVIX) 75 MG tablet Take 1 tablet (75 mg total) by mouth daily with breakfast.  . Dexlansoprazole 30 MG capsule Take 30 mg by mouth daily.   . diclofenac sodium (VOLTAREN) 1 % GEL Apply 2 g topically 4 (four) times daily as needed (Pain).   Marland Kitchen diltiazem (CARDIZEM CD) 120 MG 24 hr capsule Take 1 capsule (120 mg total) by mouth daily.  . ferrous sulfate 325 (65 FE) MG tablet Take 1 tablet (  325 mg total) by mouth 2 (two) times daily with a meal.  . fluconazole (DIFLUCAN) 200 MG tablet Take 2 tablets (400 mg total) by mouth daily.  Marland Kitchen HYDROcodone-acetaminophen (NORCO) 10-325 MG tablet Take 1 tablet by mouth 4 (four) times daily.  . insulin aspart (NOVOLOG FLEXPEN) 100 UNIT/ML FlexPen Give per sliding scale 2 times a day, 140-199 - 2 units, 200-250 - 4 units, 251-299 - 6 units,  300-349 - 8 units,  350 or above 10 units. Dispense syringes and needles as needed, Ok to switch to PEN if approved. Substitute to any brand approved. DX DM2, Code E11.65  . isosorbide dinitrate (ISORDIL) 5 MG tablet Take 1 tablet (5 mg total) by mouth 3 (three) times daily before meals.  Marland Kitchen leflunomide (ARAVA) 20 MG tablet Take 20 mg by mouth daily.  . metoprolol succinate (TOPROL-XL) 25 MG 24 hr tablet Take 25 mg by mouth daily.  . piperacillin-tazobactam (ZOSYN) IVPB Inject 3.375 g into the vein every 6 (six) hours. Indication:  Intraabdominal abscess Last Day of Therapy:  06/07/18 Labs - Once weekly:  CBC/D and BMP, Labs - Every other week:  ESR and CRP  . pravastatin  (PRAVACHOL) 40 MG tablet Take 40 mg by mouth at bedtime.  . predniSONE (DELTASONE) 5 MG tablet Take 5 mg by mouth daily with breakfast.  . pregabalin (LYRICA) 50 MG capsule Take 50 mg by mouth 3 (three) times daily.  . Tafluprost (ZIOPTAN) 0.0015 % SOLN Place 1 drop into both eyes at bedtime.   . temazepam (RESTORIL) 30 MG capsule Take 1 capsule (30 mg total) by mouth at bedtime.  . timolol (BETIMOL) 0.5 % ophthalmic solution Place 1 drop into both eyes 2 (two) times daily.   . Tofacitinib Citrate (XELJANZ) 5 MG TABS Take 1 tablet by mouth daily.  . vancomycin 750 mg in sodium chloride 0.9 % 150 mL Inject 750 mg into the vein daily. 1 bag  . VITAMIN D, ERGOCALCIFEROL, PO Take 50,000 Units by mouth once a week. Thursdays  . warfarin (COUMADIN) 2 MG tablet Take 1 tablet (2 mg total) by mouth daily.  . [DISCONTINUED] magnesium oxide (MAG-OX) 400 MG tablet Take 400 mg by mouth 2 (two) times daily. For 7 days from 05/16/2018-05/22/2018  . [DISCONTINUED] potassium chloride SA (K-DUR,KLOR-CON) 20 MEQ tablet Take 20 mEq by mouth daily.   No facility-administered encounter medications on file as of 05/26/2018.      Review of Systems  Constitutional: Negative.  Negative for activity change and appetite change.  HENT: Negative.   Respiratory: Negative for cough and shortness of breath.   Cardiovascular: Negative for chest pain.  Gastrointestinal: Negative.   Genitourinary: Negative.   Musculoskeletal: Positive for arthralgias.  Skin: Positive for wound.  Neurological: Positive for weakness.  Psychiatric/Behavioral: Positive for dysphoric mood and sleep disturbance. The patient is nervous/anxious.     Immunization History  Administered Date(s) Administered  . Influenza Split 03/26/2012, 03/11/2013  . Influenza, High Dose Seasonal PF 04/07/2018   Pertinent  Health Maintenance Due  Topic Date Due  . FOOT EXAM  06/14/2018 (Originally 08/14/1955)  . MAMMOGRAM  06/14/2018 (Originally 11/17/2016)  .  OPHTHALMOLOGY EXAM  06/14/2018 (Originally 08/14/1955)  . URINE MICROALBUMIN  06/14/2018 (Originally 08/14/1955)  . DEXA SCAN  06/14/2018 (Originally 08/13/2009)  . PNA vac Low Risk Adult (1 of 2 - PCV13) 06/14/2018 (Originally 08/13/2009)  . HEMOGLOBIN A1C  10/07/2018  . COLONOSCOPY  02/14/2028  . INFLUENZA VACCINE  Completed   No flowsheet data  found. Functional Status Survey:    Vitals:   05/26/18 1433  BP: 140/77  Pulse: 86  Resp: 18  Temp: 98.2 F (36.8 C)  TempSrc: Oral  SpO2: 96%   There is no height or weight on file to calculate BMI. Physical Exam Constitutional:      Appearance: She is well-developed.  HENT:     Head: Normocephalic.  Eyes:     Pupils: Pupils are equal, round, and reactive to light.  Neck:     Musculoskeletal: Neck supple.  Cardiovascular:     Rate and Rhythm: Normal rate and regular rhythm.     Heart sounds: No murmur.  Pulmonary:     Effort: Pulmonary effort is normal. No respiratory distress.     Breath sounds: Normal breath sounds. No stridor. No wheezing.  Abdominal:     Palpations: Abdomen is soft.     Comments: Has open midline wound . 2 Drains and Ileostomy Bag  Skin:    General: Skin is warm and dry.  Neurological:     Mental Status: She is alert and oriented to person, place, and time.     Comments: No Focal Deficits  Psychiatric:        Behavior: Behavior normal.        Thought Content: Thought content normal.     Labs reviewed: Recent Labs    05/06/18 0406 05/07/18 0348  05/09/18 0429  05/11/18 0321 05/13/18 0412  05/15/18 0815  05/21/18 0700 05/23/18 1545 05/26/18 1510  NA 143 141   < > 136   < > 136 137   < >  --    < > 142 138 143  K 4.0 3.9   < > 3.3*   < > 3.6 3.7   < >  --    < > 3.4* 4.2 3.5  CL 102 101   < > 101   < > 99 103   < >  --    < > 112* 110 120*  CO2 31 30   < > 28   < > 26 23   < >  --    < > 22 20* 19*  GLUCOSE 113* 121*   < > 90   < > 88 85   < >  --    < > 119* 161* 144*  BUN 50* 48*   < > 32*   <  > 23 16   < >  --    < > 18 26* 23  CREATININE 1.14* 1.12*   < > 1.00   < > 0.93 0.92   < >  --    < > 0.78 0.85 0.86  CALCIUM 8.1* 7.9*   < > 7.8*   < > 7.8* 7.9*   < >  --    < > 8.0* 7.8* 7.0*  MG 1.8 1.5*   < > 1.8   < > 2.1 1.5*  --  1.4*  --   --   --   --   PHOS 5.7* 4.7*  --  3.6  --   --   --   --   --   --   --   --   --    < > = values in this interval not displayed.   Recent Labs    05/05/18 0351 05/07/18 0348 05/13/18 0412  AST 64* 44* 22  ALT 47* 42 16  ALKPHOS 93 101 81  BILITOT 0.4 0.6  0.6  PROT 4.9* 5.0* 4.9*  ALBUMIN 1.7* 1.8* 1.6*   Recent Labs    04/28/18 0305  05/05/18 0351  05/13/18 0412 05/14/18 0615 05/19/18 2030  WBC 10.2   < > 13.1*   < > 10.5 10.0 7.5  NEUTROABS 7.7  --  10.7*  --   --  7.3  --   HGB 7.7*   < > 8.0*   < > 7.5* 8.2* 8.1*  HCT 24.9*   < > 24.8*   < > 24.4* 26.3* 27.3*  MCV 93.6   < > 91.5   < > 94.9 97.8 100.7*  PLT 348   < > 262   < > 368 380 433*   < > = values in this interval not displayed.   Lab Results  Component Value Date   TSH 7.944 (H) 05/07/2018   Lab Results  Component Value Date   HGBA1C 5.9 (H) 04/07/2018   Lab Results  Component Value Date   CHOL 145 06/21/2013   HDL 45 06/21/2013   LDLCALC 65 06/21/2013   TRIG 185 (H) 05/05/2018   CHOLHDL 3.2 06/21/2013    Significant Diagnostic Results in last 30 days:  Ct Abdomen Pelvis Wo Contrast  Result Date: 04/27/2018 CLINICAL DATA:  Re-evaluate diverticulitis with abscess drainage. EXAM: CT ABDOMEN AND PELVIS WITHOUT CONTRAST TECHNIQUE: Multidetector CT imaging of the abdomen and pelvis was performed following the standard protocol without IV contrast. COMPARISON:  CT scan April 23, 2018 FINDINGS: Lower chest: No acute abnormality. Hepatobiliary: No focal liver abnormality is seen. Status post cholecystectomy. No biliary dilatation. Pancreas: Unremarkable. No pancreatic ductal dilatation or surrounding inflammatory changes. Spleen: Normal in size without focal  abnormality. Adrenals/Urinary Tract: There is a tiny nonobstructive stone in the lower pole the right kidney. No hydronephrosis or perinephric stranding. No ureteral stones. The bladder is normal. The adrenal glands are normal. Stomach/Bowel: The stomach is normal. There is a duodenal diverticulum off the distal third portion of the duodenum. The remainder of the small bowel is unremarkable. Colonic diverticuli are noted. A segment of sigmoid colon remains thickened. There is mild increased attenuation in the adjacent fat and some low level remaining diverticulitis is not excluded. Remainder of the colon is normal. The appendix is best seen on coronal images 52 through 57. The appendix is located just superior to a right lower quadrant drain. There is increased attenuation in the fat of the abdomen but it does not appear to arise from the appendix. Vascular/Lymphatic: Atherosclerotic changes are seen in the abdominal aorta. No adenopathy. Reproductive: Status post hysterectomy. No adnexal masses. Other: There are 3 abscess drainage catheters. The first is seen in the pelvis on axial image 73 with no surrounding fluid. The second is seen in the upper central pelvis on axial image 59 with no surrounding drainable fluid collection. The third is seen in the right side of the pelvis on image 60. The previously identified fluid and air collection in this region has resolved. The mild fat stranding in this region is thought to be due to from the drainage catheter and recently drained abscess and not arising from the adjacent appendix. Recommend attention on follow-up. There is remaining fluid in the abdomen and pelvis. There is a small pocket of fluid measuring up to 2.8 cm on axial image 43. The distal duodenum courses through this fluid. There is another pocket of fluid in the left lower quadrant of the abdomen seen on axial image 52 measuring 4.8 x 3.7  cm. There is a small extension office fluid collection medially  which abuts the drainage catheter. However, this fluid collection is clearly not being drained. Musculoskeletal: No acute or significant osseous findings. IMPRESSION: 1. There are 3 drainage catheters in the lower abdomen and pelvis. No significant fluid around any of the drainage catheters. However, there are 2 remaining fluid collections/abscesses. The first is in the region of the distal third portion of the duodenum measuring up to 2.8 cm. Duodenum appears to run through or immediately adjacent to this fluid. Another pocket of fluid is seen in the left lower quadrant of the abdomen measuring 4.8 x 3.7 cm. 2. Nonobstructive stone in the lower pole the right kidney. 3. Thickening remains in the sigmoid colon. Low-grade remaining inflammation of the sigmoid colon from diverticulitis is not excluded. 4. There is fat stranding in the region of the appendix. However, there is a adjacent drainage catheter and I suspect the stranding is secondary to the recent abscess in this region. Recommend clinical correlation and attention on follow-up. Electronically Signed   By: Dorise Bullion III M.D   On: 04/27/2018 22:51   Ct Abdomen Pelvis W Contrast  Result Date: 05/07/2018 CLINICAL DATA:  Abdominal pain, fever, abscess suspected. Partial bowel obstruction 8 days ago. EXAM: CT ABDOMEN AND PELVIS WITH CONTRAST TECHNIQUE: Multidetector CT imaging of the abdomen and pelvis was performed using the standard protocol following bolus administration of intravenous contrast. CONTRAST:  158m OMNIPAQUE IOHEXOL 300 MG/ML  SOLN COMPARISON:  CTA abdomen pelvis 04/27/2018 FINDINGS: Lower chest: Dependent airspace disease is worse right than left. Calcified granulomas are present at the left base. No other significant airspace disease is present. Heart size is normal. No significant pleural or pericardial effusion is present. Hepatobiliary: There is diffuse fatty infiltration of liver. Cholecystectomy is noted. No focal hepatic  lesions are present. The common bile duct is within normal limits. Pancreas: The pancreas is atrophic. No discrete lesions are present Spleen: Normal in size without focal abnormality. Adrenals/Urinary Tract: Adrenal glands are normal bilaterally. A 3 mm nonobstructing stone is stable at the lower pole of the right kidney. No other significant stones are present. There is no mass lesion. Minimal fluid is present about both kidneys. Ureters are within normal limits. The urinary bladder is within normal limits. Stomach/Bowel: The stomach and duodenum are within normal limits. The proximal small bowel demonstrates some inflammatory change. There is fluid within the small bowel mesentery in some wall thickening. Right lower quadrant ileostomy demonstrates contrast material. No obstruction is present. The terminal ileum is decompressed. Cecum and ascending colon are mostly decompressed. There are some diverticular changes within the transverse colon. Additional diverticular changes are present in the descending and sigmoid colon. Sigmoid colon is mostly collapsed. A pigtail drainage catheter in the right lower quadrant is stable. There is no significant fluid collection associated. A straight drain extends into the anatomic pelvis via a left lower quadrant approach. Enlarging fluid collection along the lateral peritoneal wall and the left lower quadrant now measures 5.2 x 2.0 x 2.6 cm. A fluid collection along the distal duodenum is increased in size, now measuring 3.5 x 2.2 x 5.0 cm. The collection along the pharynx limb of the loop ileostomy in the right lower quadrant measures 2.0 by 3.2 x 7.1 cm. There is some free fluid without other focal collections. Vascular/Lymphatic: Atherosclerotic calcifications are present in the aorta and branch vessels without aneurysm. No significant adenopathy is present. Reproductive: Uterus and bilateral adnexa are unremarkable. Other:  Laparotomy wound is healing by secondary  intention. The deep portion of the wound is closed. No significant ventral hernias are present. Fluid is described above. Musculoskeletal: Degenerative anterolisthesis is again noted at L4-5. Multilevel facet degenerative changes are noted. Vertebral body heights are normal. Hips are located and within normal limits. Pelvis is unremarkable. IMPRESSION: 1. 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 Diverticular changes throughout decompressed bowel without definite focal inflammation. There is some wall thickening in the sigmoid colon. 2. Existing drainage catheters are not associated with any significant residual fluid collections. 3. Three separate fluid collections are increasing, as described. 4.  Aortic Atherosclerosis (ICD10-I70.0). 5. Bibasilar airspace disease is likely atelectasis. Electronically Signed   By: San Morelle M.D.   On: 05/07/2018 13:54   Vas [place] Upper Extremity Venous Duplex  Result Date: 05/08/2018 UPPER VENOUS STUDY  Indications: Follow up of DVT found in one of the brachial veins 04/15/18 Performing Technologist: Sharion Dove RVS  Examination Guidelines: A complete evaluation includes B-mode imaging, spectral Doppler, color Doppler, and power Doppler as needed of all accessible portions of each vessel. Bilateral testing is considered an integral part of a complete examination. Limited examinations for reoccurring indications may be performed as noted.  Right Findings: +----------+------------+----------+---------+-----------+-----------------+ RIGHT     CompressiblePropertiesPhasicitySpontaneous     Summary      +----------+------------+----------+---------+-----------+-----------------+ IJV           Full                 Yes       Yes                      +----------+------------+----------+---------+-----------+-----------------+ Subclavian                         Yes       Yes                       +----------+------------+----------+---------+-----------+-----------------+ Axillary      None                                  Age Indeterminate +----------+------------+----------+---------+-----------+-----------------+ Brachial    Partial                                 Age Indeterminate +----------+------------+----------+---------+-----------+-----------------+ Cephalic      Full                                                    +----------+------------+----------+---------+-----------+-----------------+ Basilic       Full                                                    +----------+------------+----------+---------+-----------+-----------------+  Left Findings: +----------+------------+----------+---------+-----------+-------+ LEFT      CompressiblePropertiesPhasicitySpontaneousSummary +----------+------------+----------+---------+-----------+-------+ Subclavian                         Yes       Yes            +----------+------------+----------+---------+-----------+-------+  Summary:  Right: Findings consistent with age indeterminate deep vein thrombosis involving the right brachial veins and right axillary vein. DVT found in one of the brachial veins, 04/15/18, remains. DVT appears to have propagated in to the axillary vein.  Left: No evidence of thrombosis in the subclavian.  *See table(s) above for measurements and observations.  Diagnosing physician: Servando Snare MD Electronically signed by Servando Snare MD on 05/08/2018 at 6:00:08 PM.    Final    Ir Picc Replacement Leftinc Imgguide  Result Date: 05/19/2018 INDICATION: Malfunctioning PICC line. Diverticular abscess. Need to continue IV antibiotics. EXAM: FLUOROSCOPIC GUIDED PICC LINE EXCHANGE TECHNIQUE: The procedure, risks, benefits, and alternatives were explained to the patient and informed written consent was obtained. The left upper extremity and external portion of the existing PICC line was  prepped with chlorhexidine in a sterile fashion, and a sterile drape was applied covering the operative field. Maximum barrier sterile technique with sterile gowns and gloves were used for the procedure. A timeout was performed prior to the initiation of the procedure. Local anesthesia was provided with 1% lidocaine. The existing PICC line was cannulated with an 0.0018 wire which was advanced through the catheter. The catheter was exchanged for a peel-away sheath, ultimately allowing advancement of a 46-cm, 5- [place], double lumen PICC line to the level of the superior caval atrial junction. A post procedure spot fluoroscopic image was obtained. The catheter easily aspirated and flushed and was secured in place. A dressing was placed. The patient tolerated the procedure well without immediate post procedural complication. FINDINGS: After catheter exchange, the tip lies within the superior cavoatrial junction. The catheter aspirates and flushes normally, it was capped, and is ready for immediate use. IMPRESSION: Successful fluoroscopic guided exchange of left upper extremity approach 46 cm, 5 - [place], double lumen PICC with tip overlying the superior caval atrial junction. The PICC line is ready for immediate use. Read by: Gareth Eagle, PA-C Electronically Signed   By: Markus Daft M.D.   On: 05/19/2018 13:50    Assessment/Plan Depression with Anxiety Will start her on Paxil 10 mg increase to 20 mg in 1 week Patient was on Xanax at home PRN for Anxiety Will give her 0.25 mg QD PRN for 14 Days Sigmoid Diverticulitiswith perforation and Multiple Abdominal Abscess And open Surgical wound Patient has 2 Drains. With 1 draining Purulent material though per Nurses and Patient today the drainage amount is Minimal She is also on Vanco and Zosyn For 4 weeks Last Day 12/28 Oral Diflucan for 4 weeks. Has Wound Vac now for surgical incision Follow up with Surgery.Follow up CT scan was ordered by Dr Georgette Dover Acute Right  UE DVT Lovenox is discontinued on Coumadin Dose is adjusted for INR of 2Repeat INR Type 2 diabetes mellitus Oral Hypoglycemics stopped in hospital due to decreased appetite On sliding scale insulin for now Sugars running below 150  CKD (chronic kidney disease) stage 3, Creat Near baseline  Rheumatoid arthritis involving multiple sites, On low dose of Prednisone, On Xelijanz and Leflunomide Hydrocodone PRN for Pain Fibromyalgia Continue Lyrica Dose reduced to 61m TID as patient says 75 Mg makes her Drowzy  Anemia due toChronic Disease On Iron S/P Transfusion Hgb Stable for now. Repeat CBC Will also continue Dexilant H/O Dysphagia On low dose of Nitrates  CAD On Plavix and Beta Blocker and statin Deconditioning Encourage PO intake with Supplements Doing well with Therapy    Family/ staff Communication:   Labs/tests ordered:

## 2018-05-27 ENCOUNTER — Other Ambulatory Visit: Payer: Self-pay | Admitting: *Deleted

## 2018-05-27 NOTE — Patient Outreach (Signed)
[facility]) Care Management  05/27/2018  Charmon Thorson Chinn 11/01/1945 [id number]   Onsite IDT meeting. Patient continues to have PT/OT She will have IV antibiotics until 12/28 She has open wound, with wound vac. She is on SSI. Patient will have [place] care for [facility] services   Attempted to meet with patient, she had a room full of visitors. Will attempt to visit again next onsite facility visit.   Royetta Crochet. Laymond Purser, RN, BSN, [place] [phone]) Business Cell  [phone]) Toll Free Office

## 2018-05-28 ENCOUNTER — Encounter (HOSPITAL_COMMUNITY)
Admission: RE | Admit: 2018-05-28 | Discharge: 2018-05-28 | Disposition: A | Discharge status: Home / Self Care | Payer: Medicare Other | Source: Skilled Nursing Facility | Attending: *Deleted | Admitting: *Deleted

## 2018-05-28 ENCOUNTER — Other Ambulatory Visit: Payer: Self-pay

## 2018-05-28 LAB — SEDIMENTATION RATE: Sed Rate: 76 mm/hr — ABNORMAL HIGH (ref 0–22)

## 2018-05-28 LAB — BASIC METABOLIC PANEL
Anion gap: 9 (ref 5–15)
BUN: 24 mg/dL — ABNORMAL HIGH (ref 8–23)
CO2: 18 mmol/L — ABNORMAL LOW (ref 22–32)
Calcium: 8.1 mg/dL — ABNORMAL LOW (ref 8.9–10.3)
Chloride: 110 mmol/L (ref 98–111)
Creatinine, Ser: 0.82 mg/dL (ref 0.44–1.00)
GFR calc Af Amer: >60 mL/min (ref >60)
GFR calc non Af Amer: >60 mL/min (ref >60)
Glucose, Bld: 154 mg/dL — ABNORMAL HIGH (ref 70–99)
Potassium: 3.7 mmol/L (ref 3.5–5.1)
Sodium: 137 mmol/L (ref 135–145)

## 2018-05-28 LAB — CBC WITH DIFFERENTIAL/PLATELET
ABS IMMATURE GRANULOCYTES: 0.06 10*3/uL (ref 0.00–0.07)
BASOS PCT: 3 %
Basophils Absolute: 0.2 10*3/uL — ABNORMAL HIGH (ref 0.0–0.1)
Eosinophils Absolute: 0.8 10*3/uL — ABNORMAL HIGH (ref 0.0–0.5)
Eosinophils Relative: 10 %
HCT: 31.8 % — ABNORMAL LOW (ref 36.0–46.0)
Hemoglobin: 9.4 g/dL — ABNORMAL LOW (ref 12.0–15.0)
Immature Granulocytes: 1 %
Lymphocytes Relative: 9 %
Lymphs Abs: 0.7 10*3/uL (ref 0.7–4.0)
MCH: 31.1 pg (ref 26.0–34.0)
MCHC: 29.6 g/dL — ABNORMAL LOW (ref 30.0–36.0)
MCV: 105.3 fL — ABNORMAL HIGH (ref 80.0–100.0)
MONO ABS: 0.8 10*3/uL (ref 0.1–1.0)
Monocytes Relative: 10 %
Neutro Abs: 5.5 10*3/uL (ref 1.7–7.7)
Neutrophils Relative %: 67 %
Platelets: 446 10*3/uL — ABNORMAL HIGH (ref 150–400)
RBC: 3.02 MIL/uL — ABNORMAL LOW (ref 3.87–5.11)
RDW: 22.5 % — ABNORMAL HIGH (ref 11.5–15.5)
WBC: 8 10*3/uL (ref 4.0–10.5)
nRBC: 1 % — ABNORMAL HIGH (ref 0.0–0.2)

## 2018-05-28 LAB — C-REACTIVE PROTEIN: CRP: 3.3 mg/dL — ABNORMAL HIGH (ref <1.0)

## 2018-05-28 MED ORDER — HYDROCODONE-ACETAMINOPHEN 10-325 MG PO TABS: 1.0000 | ORAL_TABLET | Freq: Four times a day (QID) | ORAL | 0 refills | Status: DC

## 2018-05-28 NOTE — Telephone Encounter (Signed)
RX Fax for [facility]@ [phone]  

## 2018-05-30 ENCOUNTER — Encounter (HOSPITAL_COMMUNITY)
Admission: RE | Admit: 2018-05-30 | Discharge: 2018-05-30 | Disposition: A | Discharge status: Home / Self Care | Payer: Medicare Other | Source: Skilled Nursing Facility | Attending: Internal Medicine | Admitting: Internal Medicine

## 2018-05-30 DIAGNOSIS — A419 Sepsis, unspecified organism: Secondary | ICD-10-CM | POA: Insufficient documentation

## 2018-05-30 DIAGNOSIS — Z1622 Resistance to vancomycin related antibiotics: Secondary | ICD-10-CM | POA: Insufficient documentation

## 2018-05-30 DIAGNOSIS — Z432 Encounter for attention to ileostomy: Secondary | ICD-10-CM | POA: Insufficient documentation

## 2018-05-30 DIAGNOSIS — Z48815 Encounter for surgical aftercare following surgery on the digestive system: Secondary | ICD-10-CM | POA: Insufficient documentation

## 2018-05-30 LAB — VANCOMYCIN, TROUGH: Vancomycin Tr: 10 ug/mL — ABNORMAL LOW (ref 15–20)

## 2018-05-30 LAB — CBC
HCT: 29.7 % — ABNORMAL LOW (ref 36.0–46.0)
HEMOGLOBIN: 8.8 g/dL — AB (ref 12.0–15.0)
MCH: 30.2 pg (ref 26.0–34.0)
MCHC: 29.6 g/dL — ABNORMAL LOW (ref 30.0–36.0)
MCV: 102.1 fL — ABNORMAL HIGH (ref 80.0–100.0)
Platelets: 381 10*3/uL (ref 150–400)
RBC: 2.91 MIL/uL — ABNORMAL LOW (ref 3.87–5.11)
RDW: 21.8 % — ABNORMAL HIGH (ref 11.5–15.5)
WBC: 7.2 10*3/uL (ref 4.0–10.5)
nRBC: 1.1 % — ABNORMAL HIGH (ref 0.0–0.2)

## 2018-05-30 LAB — BASIC METABOLIC PANEL
Anion gap: 8 (ref 5–15)
BUN: 19 mg/dL (ref 8–23)
CO2: 20 mmol/L — ABNORMAL LOW (ref 22–32)
Calcium: 8 mg/dL — ABNORMAL LOW (ref 8.9–10.3)
Chloride: 111 mmol/L (ref 98–111)
Creatinine, Ser: 0.9 mg/dL (ref 0.44–1.00)
GFR calc Af Amer: >60 mL/min (ref >60)
GFR calc non Af Amer: >60 mL/min (ref >60)
Glucose, Bld: 121 mg/dL — ABNORMAL HIGH (ref 70–99)
Potassium: 3.8 mmol/L (ref 3.5–5.1)
SODIUM: 139 mmol/L (ref 135–145)

## 2018-05-30 LAB — PROTIME-INR
INR: 3.15
PROTHROMBIN TIME: 31.9 s — AB (ref 11.4–15.2)

## 2018-05-31 ENCOUNTER — Other Ambulatory Visit (HOSPITAL_COMMUNITY)
Admission: AD | Admit: 2018-05-31 | Discharge: 2018-05-31 | Disposition: A | Discharge status: Home / Self Care | Payer: Medicare Other | Source: Skilled Nursing Facility | Attending: Internal Medicine | Admitting: Internal Medicine

## 2018-05-31 DIAGNOSIS — R791 Abnormal coagulation profile: Secondary | ICD-10-CM | POA: Insufficient documentation

## 2018-05-31 LAB — PROTIME-INR
INR: 3.28
Prothrombin Time: 32.9 seconds — ABNORMAL HIGH (ref 11.4–15.2)

## 2018-06-01 ENCOUNTER — Encounter (HOSPITAL_COMMUNITY)
Admission: RE | Admit: 2018-06-01 | Discharge: 2018-06-01 | Disposition: A | Discharge status: Home / Self Care | Payer: Medicare Other | Source: Skilled Nursing Facility | Attending: *Deleted | Admitting: *Deleted

## 2018-06-01 LAB — PROTIME-INR
INR: 3.11
Prothrombin Time: 31.6 seconds — ABNORMAL HIGH (ref 11.4–15.2)

## 2018-06-02 ENCOUNTER — Encounter (HOSPITAL_COMMUNITY)
Admission: RE | Admit: 2018-06-02 | Discharge: 2018-06-02 | Disposition: A | Discharge status: Home / Self Care | Payer: Medicare Other | Source: Skilled Nursing Facility | Attending: Internal Medicine | Admitting: Internal Medicine

## 2018-06-02 DIAGNOSIS — Z48815 Encounter for surgical aftercare following surgery on the digestive system: Secondary | ICD-10-CM | POA: Insufficient documentation

## 2018-06-02 DIAGNOSIS — A419 Sepsis, unspecified organism: Secondary | ICD-10-CM | POA: Insufficient documentation

## 2018-06-02 DIAGNOSIS — Z1622 Resistance to vancomycin related antibiotics: Secondary | ICD-10-CM | POA: Insufficient documentation

## 2018-06-02 DIAGNOSIS — Z432 Encounter for attention to ileostomy: Secondary | ICD-10-CM | POA: Insufficient documentation

## 2018-06-02 DIAGNOSIS — N183 Chronic kidney disease, stage 3 (moderate): Secondary | ICD-10-CM | POA: Insufficient documentation

## 2018-06-02 DIAGNOSIS — Z978 Presence of other specified devices: Secondary | ICD-10-CM | POA: Insufficient documentation

## 2018-06-02 LAB — BASIC METABOLIC PANEL
Anion gap: 6 (ref 5–15)
BUN: 27 mg/dL — ABNORMAL HIGH (ref 8–23)
CO2: 20 mmol/L — AB (ref 22–32)
Calcium: 8.2 mg/dL — ABNORMAL LOW (ref 8.9–10.3)
Chloride: 113 mmol/L — ABNORMAL HIGH (ref 98–111)
Creatinine, Ser: 0.86 mg/dL (ref 0.44–1.00)
GFR calc Af Amer: >60 mL/min (ref >60)
GFR calc non Af Amer: >60 mL/min (ref >60)
Glucose, Bld: 149 mg/dL — ABNORMAL HIGH (ref 70–99)
Potassium: 3.7 mmol/L (ref 3.5–5.1)
Sodium: 139 mmol/L (ref 135–145)

## 2018-06-02 LAB — VANCOMYCIN, TROUGH: Vancomycin Tr: 11 ug/mL — ABNORMAL LOW (ref 15–20)

## 2018-06-02 LAB — CBC WITH DIFFERENTIAL/PLATELET
Abs Immature Granulocytes: 0.04 10*3/uL (ref 0.00–0.07)
Basophils Absolute: 0.2 10*3/uL — ABNORMAL HIGH (ref 0.0–0.1)
Basophils Relative: 2 %
Eosinophils Absolute: 0.5 10*3/uL (ref 0.0–0.5)
Eosinophils Relative: 7 %
HCT: 31.7 % — ABNORMAL LOW (ref 36.0–46.0)
Hemoglobin: 9.3 g/dL — ABNORMAL LOW (ref 12.0–15.0)
IMMATURE GRANULOCYTES: 1 %
LYMPHS ABS: 0.6 10*3/uL — AB (ref 0.7–4.0)
LYMPHS PCT: 8 %
MCH: 30.5 pg (ref 26.0–34.0)
MCHC: 29.3 g/dL — ABNORMAL LOW (ref 30.0–36.0)
MCV: 103.9 fL — ABNORMAL HIGH (ref 80.0–100.0)
Monocytes Absolute: 0.7 10*3/uL (ref 0.1–1.0)
Monocytes Relative: 11 %
Neutro Abs: 5 10*3/uL (ref 1.7–7.7)
Neutrophils Relative %: 71 %
Platelets: 377 10*3/uL (ref 150–400)
RBC: 3.05 MIL/uL — ABNORMAL LOW (ref 3.87–5.11)
RDW: 22.1 % — ABNORMAL HIGH (ref 11.5–15.5)
WBC: 6.9 10*3/uL (ref 4.0–10.5)
nRBC: 0.7 % — ABNORMAL HIGH (ref 0.0–0.2)

## 2018-06-02 LAB — PROTIME-INR
INR: 2.57
Prothrombin Time: 27.2 seconds — ABNORMAL HIGH (ref 11.4–15.2)

## 2018-06-03 ENCOUNTER — Other Ambulatory Visit: Payer: Self-pay | Admitting: *Deleted

## 2018-06-03 NOTE — Patient Outreach (Signed)
[facility]) Care Management  06/03/2018  [name] 1945-10-26 [id number]   Onsite visit to [facility]. Met with patient and her spouse in the room. Patient and spouse both very appreciative of care while at [facility], they report patient was close to death due to seriousness of her illness. They verbalized they have a lot of concerns over possible missed diagnosis and the resulting complications patient has experienced.  Patient continues to have IV antibiotics, and now has a new type of drainage in drain tube. Nurse at facility is contacting surgeon to report the unusual drainage.   RNCM reviewed [facility] care management and left a packet for them with RNCM contact.  Both patient and spouse seem interested but have a lot of other concerns. RNCM discussed difference in [facility] care management and home care services, they both voiced understanding.   Met with [name], SW, she has not heard a discharge date as of yet.   Plan to follow up at next facility visit. Will collaborate with [facility]. Royetta Crochet. Laymond Purser, MSN, RN, [place] [phone]) Business Cell  [phone]) Toll Free Office

## 2018-06-04 ENCOUNTER — Other Ambulatory Visit (HOSPITAL_COMMUNITY)
Admission: RE | Admit: 2018-06-04 | Discharge: 2018-06-04 | Disposition: A | Discharge status: Home / Self Care | Payer: Medicare Other | Source: Skilled Nursing Facility | Attending: Internal Medicine | Admitting: Internal Medicine

## 2018-06-04 DIAGNOSIS — E785 Hyperlipidemia, unspecified: Secondary | ICD-10-CM | POA: Insufficient documentation

## 2018-06-04 DIAGNOSIS — Z1622 Resistance to vancomycin related antibiotics: Secondary | ICD-10-CM | POA: Insufficient documentation

## 2018-06-04 LAB — CBC WITH DIFFERENTIAL/PLATELET
Abs Immature Granulocytes: 0.03 10*3/uL (ref 0.00–0.07)
Basophils Absolute: 0.2 10*3/uL — ABNORMAL HIGH (ref 0.0–0.1)
Basophils Relative: 2 %
Eosinophils Absolute: 0.4 10*3/uL (ref 0.0–0.5)
Eosinophils Relative: 5 %
HCT: 32.7 % — ABNORMAL LOW (ref 36.0–46.0)
Hemoglobin: 9.7 g/dL — ABNORMAL LOW (ref 12.0–15.0)
Immature Granulocytes: 0 %
Lymphocytes Relative: 7 %
Lymphs Abs: 0.5 10*3/uL — ABNORMAL LOW (ref 0.7–4.0)
MCH: 30.9 pg (ref 26.0–34.0)
MCHC: 29.7 g/dL — ABNORMAL LOW (ref 30.0–36.0)
MCV: 104.1 fL — ABNORMAL HIGH (ref 80.0–100.0)
MONO ABS: 0.8 10*3/uL (ref 0.1–1.0)
Monocytes Relative: 11 %
NEUTROS ABS: 5.5 10*3/uL (ref 1.7–7.7)
Neutrophils Relative %: 75 %
Platelets: 348 10*3/uL (ref 150–400)
RBC: 3.14 MIL/uL — ABNORMAL LOW (ref 3.87–5.11)
RDW: 21.9 % — ABNORMAL HIGH (ref 11.5–15.5)
WBC: 7.3 10*3/uL (ref 4.0–10.5)
nRBC: 0.4 % — ABNORMAL HIGH (ref 0.0–0.2)

## 2018-06-04 LAB — BASIC METABOLIC PANEL
Anion gap: 7 (ref 5–15)
BUN: 20 mg/dL (ref 8–23)
CO2: 18 mmol/L — ABNORMAL LOW (ref 22–32)
Calcium: 7.6 mg/dL — ABNORMAL LOW (ref 8.9–10.3)
Chloride: 115 mmol/L — ABNORMAL HIGH (ref 98–111)
Creatinine, Ser: 0.76 mg/dL (ref 0.44–1.00)
GFR calc Af Amer: >60 mL/min (ref >60)
Glucose, Bld: 112 mg/dL — ABNORMAL HIGH (ref 70–99)
Potassium: 3.3 mmol/L — ABNORMAL LOW (ref 3.5–5.1)
Sodium: 140 mmol/L (ref 135–145)

## 2018-06-05 ENCOUNTER — Encounter (HOSPITAL_COMMUNITY)
Admission: RE | Admit: 2018-06-05 | Discharge: 2018-06-05 | Disposition: A | Discharge status: Home / Self Care | Payer: Medicare Other | Source: Skilled Nursing Facility | Attending: Internal Medicine | Admitting: Internal Medicine

## 2018-06-05 DIAGNOSIS — Z7901 Long term (current) use of anticoagulants: Secondary | ICD-10-CM | POA: Insufficient documentation

## 2018-06-05 DIAGNOSIS — Z48815 Encounter for surgical aftercare following surgery on the digestive system: Secondary | ICD-10-CM | POA: Insufficient documentation

## 2018-06-05 DIAGNOSIS — Z1622 Resistance to vancomycin related antibiotics: Secondary | ICD-10-CM | POA: Insufficient documentation

## 2018-06-05 DIAGNOSIS — A419 Sepsis, unspecified organism: Secondary | ICD-10-CM | POA: Insufficient documentation

## 2018-06-05 LAB — PROTIME-INR
INR: 1.77
Prothrombin Time: 20.4 seconds — ABNORMAL HIGH (ref 11.4–15.2)

## 2018-06-06 ENCOUNTER — Encounter (HOSPITAL_COMMUNITY)
Admission: RE | Admit: 2018-06-06 | Discharge: 2018-06-06 | Disposition: A | Discharge status: Home / Self Care | Payer: Medicare Other | Source: Skilled Nursing Facility | Attending: Internal Medicine | Admitting: Internal Medicine

## 2018-06-06 ENCOUNTER — Other Ambulatory Visit: Payer: Self-pay

## 2018-06-06 ENCOUNTER — Encounter: Payer: Self-pay | Admitting: Internal Medicine

## 2018-06-06 ENCOUNTER — Emergency Department (HOSPITAL_COMMUNITY)
Admission: EM | Admit: 2018-06-06 | Discharge: 2018-06-06 | Disposition: A | Discharge status: Home / Self Care | Payer: Medicare Other | Attending: Emergency Medicine | Admitting: Emergency Medicine

## 2018-06-06 ENCOUNTER — Encounter (HOSPITAL_COMMUNITY): Payer: Self-pay | Admitting: *Deleted

## 2018-06-06 ENCOUNTER — Non-Acute Institutional Stay (SKILLED_NURSING_FACILITY): Payer: Medicare Other | Admitting: Internal Medicine

## 2018-06-06 DIAGNOSIS — K929 Disease of digestive system, unspecified: Secondary | ICD-10-CM

## 2018-06-06 DIAGNOSIS — E1169 Type 2 diabetes mellitus with other specified complication: Secondary | ICD-10-CM | POA: Diagnosis not present

## 2018-06-06 DIAGNOSIS — R790 Abnormal level of blood mineral: Secondary | ICD-10-CM

## 2018-06-06 DIAGNOSIS — N183 Chronic kidney disease, stage 3 (moderate): Secondary | ICD-10-CM | POA: Insufficient documentation

## 2018-06-06 DIAGNOSIS — Z1622 Resistance to vancomycin related antibiotics: Secondary | ICD-10-CM | POA: Insufficient documentation

## 2018-06-06 DIAGNOSIS — Z48815 Encounter for surgical aftercare following surgery on the digestive system: Secondary | ICD-10-CM | POA: Insufficient documentation

## 2018-06-06 DIAGNOSIS — K5720 Diverticulitis of large intestine with perforation and abscess without bleeding: Secondary | ICD-10-CM

## 2018-06-06 DIAGNOSIS — I4891 Unspecified atrial fibrillation: Secondary | ICD-10-CM | POA: Insufficient documentation

## 2018-06-06 DIAGNOSIS — Z7901 Long term (current) use of anticoagulants: Secondary | ICD-10-CM | POA: Insufficient documentation

## 2018-06-06 DIAGNOSIS — A419 Sepsis, unspecified organism: Secondary | ICD-10-CM | POA: Insufficient documentation

## 2018-06-06 DIAGNOSIS — K9409 Other complications of colostomy: Secondary | ICD-10-CM | POA: Diagnosis not present

## 2018-06-06 DIAGNOSIS — E876 Hypokalemia: Secondary | ICD-10-CM | POA: Diagnosis not present

## 2018-06-06 LAB — CBC WITH DIFFERENTIAL/PLATELET
Abs Immature Granulocytes: 0.02 10*3/uL (ref 0.00–0.07)
Basophils Absolute: 0.2 10*3/uL — ABNORMAL HIGH (ref 0.0–0.1)
Basophils Relative: 2 %
Eosinophils Absolute: 0.4 10*3/uL (ref 0.0–0.5)
Eosinophils Relative: 4 %
HCT: 33.6 % — ABNORMAL LOW (ref 36.0–46.0)
Hemoglobin: 10 g/dL — ABNORMAL LOW (ref 12.0–15.0)
Immature Granulocytes: 0 %
LYMPHS PCT: 5 %
Lymphs Abs: 0.4 10*3/uL — ABNORMAL LOW (ref 0.7–4.0)
MCH: 31.1 pg (ref 26.0–34.0)
MCHC: 29.8 g/dL — ABNORMAL LOW (ref 30.0–36.0)
MCV: 104.3 fL — ABNORMAL HIGH (ref 80.0–100.0)
Monocytes Absolute: 0.8 10*3/uL (ref 0.1–1.0)
Monocytes Relative: 9 %
Neutro Abs: 7.2 10*3/uL (ref 1.7–7.7)
Neutrophils Relative %: 80 %
Platelets: 352 10*3/uL (ref 150–400)
RBC: 3.22 MIL/uL — ABNORMAL LOW (ref 3.87–5.11)
RDW: 21.4 % — ABNORMAL HIGH (ref 11.5–15.5)
WBC: 9 10*3/uL (ref 4.0–10.5)
nRBC: 0.2 % (ref 0.0–0.2)

## 2018-06-06 LAB — BASIC METABOLIC PANEL
Anion gap: 9 (ref 5–15)
BUN: 17 mg/dL (ref 8–23)
CO2: 18 mmol/L — ABNORMAL LOW (ref 22–32)
Calcium: 8 mg/dL — ABNORMAL LOW (ref 8.9–10.3)
Chloride: 114 mmol/L — ABNORMAL HIGH (ref 98–111)
Creatinine, Ser: 0.85 mg/dL (ref 0.44–1.00)
GFR calc Af Amer: >60 mL/min (ref >60)
GFR calc non Af Amer: >60 mL/min (ref >60)
GLUCOSE: 103 mg/dL — AB (ref 70–99)
Potassium: 3.4 mmol/L — ABNORMAL LOW (ref 3.5–5.1)
Sodium: 141 mmol/L (ref 135–145)

## 2018-06-06 LAB — PROTIME-INR
INR: 1.76
Prothrombin Time: 20.3 seconds — ABNORMAL HIGH (ref 11.4–15.2)

## 2018-06-06 LAB — MAGNESIUM
Magnesium: 0.7 mg/dL — CL (ref 1.7–2.4)
Magnesium: 1.6 mg/dL — ABNORMAL LOW (ref 1.7–2.4)

## 2018-06-06 MED ORDER — SODIUM CHLORIDE 0.9 % IV SOLN
INTRAVENOUS | Status: DC
  Administered 2018-06-06: 13:00:00 via INTRAVENOUS

## 2018-06-06 MED ORDER — HEPARIN SOD (PORK) LOCK FLUSH 100 UNIT/ML IV SOLN
250.0000 [IU] | INTRAVENOUS | Status: AC | PRN
  Administered 2018-06-06: 250 [IU]
  Filled 2018-06-06: qty 5

## 2018-06-06 MED ORDER — HEPARIN SOD (PORK) LOCK FLUSH 100 UNIT/ML IV SOLN
250.0000 [IU] | INTRAVENOUS | Status: DC | PRN
  Administered 2018-06-06: 250 [IU]
  Filled 2018-06-06: qty 5

## 2018-06-06 MED ORDER — DILTIAZEM HCL 25 MG/5ML IV SOLN
10.0000 mg | Freq: Once | INTRAVENOUS | Status: AC
  Administered 2018-06-06: 10 mg via INTRAVENOUS
  Filled 2018-06-06: qty 5

## 2018-06-06 MED ORDER — MAGNESIUM SULFATE 2 GM/50ML IV SOLN
2.0000 g | Freq: Once | INTRAVENOUS | Status: AC
  Administered 2018-06-06: 2 g via INTRAVENOUS
  Filled 2018-06-06: qty 50

## 2018-06-06 MED ORDER — MAGNESIUM OXIDE -MG SUPPLEMENT 200 MG PO TABS: 1.0000 | ORAL_TABLET | Freq: Every day | ORAL | 0 refills | Status: DC

## 2018-06-06 NOTE — Progress Notes (Signed)
Location:    [place] Room Number: 144/P Place of Service:  SNF [phone]) Provider:  Cory Roughen, MD  Patient Care Team: [name], MD as PCP - General (Internal Medicine)  Extended Emergency Contact Information Primary Emergency Contact: Tift,Robert Address: [address]           [place], [place] [address] Johnnette Litter of [place] Phone: [phone] Mobile Phone: [phone] Relation: Spouse  Code Status:  Full Code Goals of care: Advanced Directive information Advanced Directives 06/06/2018  Does Patient Have a Medical Advance Directive? Yes  Type of Advance Directive (No Data)  Does patient want to make changes to medical advance directive? No - Patient declined  Would patient like information on creating a medical advance directive? No - Patient declined  Pre-existing out of facility DNR order (yellow form or pink MOST form) -     Chief Complaint  Patient presents with  . Acute Visit    Critical Magnesium levels    HPI:  Pt is a 72 y.o. female seen today for an acute visit for critically low magnesium level of 0.7.  Patient was here for rehab after a lengthy hospitalization- for a perforated sigmoid diverticulitis with multiple abscesses.  She also has a history of hypertension as well as type 2 diabetes chronic kidney disease rheumatoid arthritis coronary artery disease with previous CABG as well as hyperlipidemia peripheral vascular disease.  Regards to her abdominal issues she did receive multiple antibiotics currently is on Zosyn scheduled to end on December 28.  She is also on Diflucan which is prescribed up to January 1- as well as vancomycin IV .            She also has had a wound VAC placed and does have a drain placed.  She also developed a right upper extremity DVT during hospitalization because of a PICC line and is on Coumadin.  Update labs were done today which showed a significantly depressed  magnesium level of 0.7.  She is not really complaining of any chest pain or shortness of breath or muscle weakness tremors- her heart rate is somewhat irregular-it appears she does have some history of atrial tachycardia in the past.  She is in no distress and actually was looking forward to going home today but is understanding that her magnesium is significantly low and will have to be addressed        Past Medical History:  Diagnosis Date  . Anginal pain [facility])    sees Dr Einar Gip.   . Arthritis    rheumatoid ..   . Diabetes mellitus without complication (HCC)    Metformin 2.3.2017  . Diverticulitis   . Diverticulitis of large intestine with perforation and abscess 04/05/2018  . Dysrhythmia   . Fibromyalgia   . GERD (gastroesophageal reflux disease)   . High cholesterol   . Hypertension   . Liver disease 08- 2012   per Dr Dagmar Hait pt has enlarged liver  . Peripheral vascular disease (HCC)    legs  . Pneumonia 06/17/2016  . Sleep apnea    sleep study  oct 2012  . Stroke [facility]) 2011   [facility]     Past Surgical History:  Procedure Laterality Date  . [place]  . CHOLECYSTECTOMY  1996  . COLONOSCOPY N/A 02/13/2018   Procedure: COLONOSCOPY;  Surgeon: Rogene Houston, MD;  [facility];  Service: Endoscopy;  Laterality: N/A;  8:[place]  . ESOPHAGEAL DILATION N/A 01/13/2018   Procedure: ESOPHAGEAL DILATION;  Surgeon: Rogene Houston, MD;  [facility];  Service: Endoscopy;  Laterality: N/A;  . ESOPHAGOGASTRODUODENOSCOPY N/A 01/13/2018   Procedure: ESOPHAGOGASTRODUODENOSCOPY (EGD);  Surgeon: Rogene Houston, MD;  [facility];  Service: Endoscopy;  Laterality: N/A;  . EYE SURGERY  2013   cat ext bilateral .  . EYE SURGERY  2002   laser  . FOREIGN BODY REMOVAL  02/13/2018   Procedure: FOREIGN BODY REMOVAL;  Surgeon: Rogene Houston, MD;  [facility];  Service: Endoscopy;;   foreign body removal which appears to be food debri in a stem form removed from colon by DR. Rehman  . [place]- 2007   rod right leg - right wrist plate  . GAS/FLUID EXCHANGE  03/25/2012   Procedure: GAS/FLUID EXCHANGE;  Surgeon: Hayden Pedro, MD;  Location: [place];  Service: Ophthalmology;  Laterality: Right;  . LAPAROTOMY N/A 04/30/2018   Procedure: EXPLORATORY LAPAROTOMY  LYSIS OF ADHESIONS SMALL BOWEL RESECTION WITH ANASTOMSIS LOOP ILEOSTOMY PLACEMENT OF DRAINS AND WOUND [place];  Surgeon: Donnie Mesa, MD;  Location: [place];  Service: General;  Laterality: N/A;  . PARS PLANA VITRECTOMY  03/25/2012   Procedure: PARS PLANA VITRECTOMY WITH 25 GAUGE;  Surgeon: Hayden Pedro, MD;  Location: [place];  Service: Ophthalmology;  Laterality: Right;    Allergies  Allergen Reactions  . Lactose Intolerance (Gi) Other (See Comments)    G.I. Upset  . Butrans [Buprenorphine] Rash and Other (See Comments)    Infected skin underneath application  . Penicillins Rash    Facial rash Has patient had a PCN reaction causing immediate rash, facial/tongue/throat swelling, SOB or lightheadedness with hypotension: Yes Has patient had a PCN reaction causing severe rash involving mucus membranes or skin necrosis: No Has patient had a PCN reaction that required hospitalization No Has patient had a PCN reaction occurring within the last 10 years: Yes If all of the above answers are "NO", then may proceed with Cephalosporin use.   . Simvastatin Rash    Outpatient Encounter Medications as of 06/06/2018  Medication Sig  . ALPRAZolam (XANAX) 0.25 MG tablet Take 0.25 mg by mouth daily as needed for anxiety. Take for 14 days from 05/26/2018-06/08/2018  . Blood Glucose Monitoring Suppl (FREESTYLE LITE) DEVI See admin instructions.  . clopidogrel (PLAVIX) 75 MG tablet Take 1 tablet (75 mg total) by mouth daily with breakfast.  . Dexlansoprazole 30 MG capsule Take 30 mg by mouth daily.   . diclofenac  sodium (VOLTAREN) 1 % GEL Apply 2 g topically 4 (four) times daily as needed (Pain).   Marland Kitchen diltiazem (CARDIZEM CD) 120 MG 24 hr capsule Take 1 capsule (120 mg total) by mouth daily.  . ferrous sulfate 325 (65 FE) MG tablet Take 1 tablet (325 mg total) by mouth 2 (two) times daily with a meal.  . fluconazole (DIFLUCAN) 200 MG tablet Take 2 tablets (400 mg total) by mouth daily.  Marland Kitchen HYDROcodone-acetaminophen (NORCO) 10-325 MG tablet Take 1 tablet by mouth 4 (four) times daily.  . insulin aspart (NOVOLOG FLEXPEN) 100 UNIT/ML FlexPen Give per sliding scale 2 times a day, 140-199 - 2 units, 200-250 - 4 units, 251-299 - 6 units,  300-349 - 8 units,  350 or above 10 units. Dispense syringes and needles as needed, Ok to switch to PEN if approved. Substitute to any  brand approved. DX DM2, Code E11.65  . isosorbide dinitrate (ISORDIL) 5 MG tablet Take 1 tablet (5 mg total) by mouth 3 (three) times daily before meals.  Marland Kitchen leflunomide (ARAVA) 20 MG tablet Take 20 mg by mouth daily.  . metoprolol succinate (TOPROL-XL) 25 MG 24 hr tablet Take 25 mg by mouth daily.  Marland Kitchen PARoxetine (PAXIL) 20 MG tablet Take 20 mg by mouth daily.  . piperacillin-tazobactam (ZOSYN) IVPB Inject 3.375 g into the vein every 6 (six) hours. Indication:  Intraabdominal abscess Last Day of Therapy:  06/07/18 Labs - Once weekly:  CBC/D and BMP, Labs - Every other week:  ESR and CRP  . potassium chloride SA (K-DUR,KLOR-CON) 20 MEQ tablet Take 20 mEq by mouth daily.  . pravastatin (PRAVACHOL) 40 MG tablet Take 40 mg by mouth at bedtime.  . predniSONE (DELTASONE) 5 MG tablet Take 5 mg by mouth daily with breakfast.  . pregabalin (LYRICA) 50 MG capsule Take 1 capsule (50 mg total) by mouth 3 (three) times daily.  . Tafluprost (ZIOPTAN) 0.0015 % SOLN Place 1 drop into both eyes at bedtime.   . temazepam (RESTORIL) 30 MG capsule Take 1 capsule (30 mg total) by mouth at bedtime.  . timolol (BETIMOL) 0.5 % ophthalmic solution Place 1 drop into both  eyes 2 (two) times daily.   . Tofacitinib Citrate (XELJANZ) 5 MG TABS Take 1 tablet by mouth daily.  . vancomycin 1,000 mg in sodium chloride 0.9 % 250 mL Inject 1,000 mg into the vein every evening.  Marland Kitchen VITAMIN D, ERGOCALCIFEROL, PO Take 50,000 Units by mouth once a week. Thursdays  . warfarin (COUMADIN) 1 MG tablet Take 1 mg by mouth daily. On Mon., Wed., Fri.  . warfarin (COUMADIN) 1 MG tablet Take 1.5 mg by mouth daily. Tues., Thurs., Sat.  . [DISCONTINUED] vancomycin 750 mg in sodium chloride 0.9 % 150 mL Inject 750 mg into the vein daily. 1 bag  . [DISCONTINUED] warfarin (COUMADIN) 2 MG tablet Take 1 tablet (2 mg total) by mouth daily.   No facility-administered encounter medications on file as of 06/06/2018.     Review of Systems   In general she is not complaining of any fever or chills.  Skin is not complain of rashes or itching does have some concern about skin issues around her drainage site.  Head ears eyes nose mouth and throat does not complain of visual changes or sore throat.  Respiratory does not complain of shortness of breath or cough.  Cardiac is not complaining of chest pain.  GI has an extensive history here does not complain of abdominal pain however at this point.  GU is not complaining of dysuria.  Musculoskeletal does have weakness joint complaints but not  acutely complaining today of that.  Neurologic does not complain of dizziness or syncope or numbness.  And psych does not complain of overt depression or she has had somewhat of a dysphoric mood and was recently started on Paxil as PRN Xanax  Immunization History  Administered Date(s) Administered  . Influenza Split 03/26/2012, 03/11/2013  . Influenza, High Dose Seasonal PF 04/07/2018   Pertinent  Health Maintenance Due  Topic Date Due  . FOOT EXAM  06/14/2018 (Originally 08/14/1955)  . MAMMOGRAM  06/14/2018 (Originally 11/17/2016)  . OPHTHALMOLOGY EXAM  06/14/2018 (Originally 08/14/1955)  . URINE  MICROALBUMIN  06/14/2018 (Originally 08/14/1955)  . DEXA SCAN  06/14/2018 (Originally 08/13/2009)  . PNA vac Low Risk Adult (1 of 2 - PCV13) 06/14/2018 (Originally 08/13/2009)  .  HEMOGLOBIN A1C  10/07/2018  . COLONOSCOPY  02/14/2028  . INFLUENZA VACCINE  Completed   No flowsheet data found. Functional Status Survey:    Vitals:   06/06/18 1018  BP: 118/67  Pulse: 60  Resp: 20  Temp: 97.8 F (36.6 C)  TempSrc: Oral  Of no pulse on auscultation was between 96-104  Physical Exam In general this is a pleasant elderly female in no distress resting comfortably in bed.  Her skin is warm and dry she does have drains placed abdominal area - she does have an ileostomy bag- she has covering of her wound.  Eyes visual acuity appears to be intact.  Chest is clear to auscultation with shallow air entry.  Heart is regular irregular rate and rhythm borderline tachycardic  Abdomen is obese soft nontender with drains and wound as noted above.  Musculoskeletal Limited exam since she is in bed but does have lower extremity weakness-debility-- she does move her upper extremities it appears at relative baseline.  Neurologic she is alert pleasant appropriate cannot really appreciate lateralizing findings cranial nerves intact her speech is clear.  Psych she is alert and oriented pleasant appropriate   Labs reviewed: Recent Labs    05/06/18 0406 05/07/18 0348  05/09/18 0429  05/13/18 0412  05/15/18 0815  06/02/18 1500 06/04/18 0942 06/06/18 0708  NA 143 141   < > 136   < > 137   < >  --    < > 139 140 141  K 4.0 3.9   < > 3.3*   < > 3.7   < >  --    < > 3.7 3.3* 3.4*  CL 102 101   < > 101   < > 103   < >  --    < > 113* 115* 114*  CO2 31 30   < > 28   < > 23   < >  --    < > 20* 18* 18*  GLUCOSE 113* 121*   < > 90   < > 85   < >  --    < > 149* 112* 103*  BUN 50* 48*   < > 32*   < > 16   < >  --    < > 27* 20 17  CREATININE 1.14* 1.12*   < > 1.00   < > 0.92   < >  --    < > 0.86 0.76 0.85    CALCIUM 8.1* 7.9*   < > 7.8*   < > 7.9*   < >  --    < > 8.2* 7.6* 8.0*  MG 1.8 1.5*   < > 1.8   < > 1.5*  --  1.4*  --   --   --  0.7*  PHOS 5.7* 4.7*  --  3.6  --   --   --   --   --   --   --   --    < > = values in this interval not displayed.   Recent Labs    05/05/18 0351 05/07/18 0348 05/13/18 0412  AST 64* 44* 22  ALT 47* 42 16  ALKPHOS 93 101 81  BILITOT 0.4 0.6 0.6  PROT 4.9* 5.0* 4.9*  ALBUMIN 1.7* 1.8* 1.6*   Recent Labs    05/28/18 0930 05/30/18 1615 06/02/18 0811 06/04/18 0942  WBC 8.0 7.2 6.9 7.3  NEUTROABS 5.5  --  5.0 5.5  HGB 9.4*  8.8* 9.3* 9.7*  HCT 31.8* 29.7* 31.7* 32.7*  MCV 105.3* 102.1* 103.9* 104.1*  PLT 446* 381 377 348   Lab Results  Component Value Date   TSH 7.944 (H) 05/07/2018   Lab Results  Component Value Date   HGBA1C 5.9 (H) 04/07/2018   Lab Results  Component Value Date   CHOL 145 06/21/2013   HDL 45 06/21/2013   LDLCALC 65 06/21/2013   TRIG 185 (H) 05/05/2018   CHOLHDL 3.2 06/21/2013    Significant Diagnostic Results in last 30 days:  Ct Abdomen Pelvis W Contrast  Result Date: 05/07/2018 CLINICAL DATA:  Abdominal pain, fever, abscess suspected. Partial bowel obstruction 8 days ago. EXAM: CT ABDOMEN AND PELVIS WITH CONTRAST TECHNIQUE: Multidetector CT imaging of the abdomen and pelvis was performed using the standard protocol following bolus administration of intravenous contrast. CONTRAST:  126m OMNIPAQUE IOHEXOL 300 MG/ML  SOLN COMPARISON:  CTA abdomen pelvis 04/27/2018 FINDINGS: Lower chest: Dependent airspace disease is worse right than left. Calcified granulomas are present at the left base. No other significant airspace disease is present. Heart size is normal. No significant pleural or pericardial effusion is present. Hepatobiliary: There is diffuse fatty infiltration of liver. Cholecystectomy is noted. No focal hepatic lesions are present. The common bile duct is within normal limits. Pancreas: The pancreas is atrophic.  No discrete lesions are present Spleen: Normal in size without focal abnormality. Adrenals/Urinary Tract: Adrenal glands are normal bilaterally. A 3 mm nonobstructing stone is stable at the lower pole of the right kidney. No other significant stones are present. There is no mass lesion. Minimal fluid is present about both kidneys. Ureters are within normal limits. The urinary bladder is within normal limits. Stomach/Bowel: The stomach and duodenum are within normal limits. The proximal small bowel demonstrates some inflammatory change. There is fluid within the small bowel mesentery in some wall thickening. Right lower quadrant ileostomy demonstrates contrast material. No obstruction is present. The terminal ileum is decompressed. Cecum and ascending colon are mostly decompressed. There are some diverticular changes within the transverse colon. Additional diverticular changes are present in the descending and sigmoid colon. Sigmoid colon is mostly collapsed. A pigtail drainage catheter in the right lower quadrant is stable. There is no significant fluid collection associated. A straight drain extends into the anatomic pelvis via a left lower quadrant approach. Enlarging fluid collection along the lateral peritoneal wall and the left lower quadrant now measures 5.2 x 2.0 x 2.6 cm. A fluid collection along the distal duodenum is increased in size, now measuring 3.5 x 2.2 x 5.0 cm. The collection along the pharynx limb of the loop ileostomy in the right lower quadrant measures 2.0 by 3.2 x 7.1 cm. There is some free fluid without other focal collections. Vascular/Lymphatic: Atherosclerotic calcifications are present in the aorta and branch vessels without aneurysm. No significant adenopathy is present. Reproductive: Uterus and bilateral adnexa are unremarkable. Other: Laparotomy wound is healing by secondary intention. The deep portion of the wound is closed. No significant ventral hernias are present. Fluid is  described above. Musculoskeletal: Degenerative anterolisthesis is again noted at L4-5. Multilevel facet degenerative changes are noted. Vertebral body heights are normal. Hips are located and within normal limits. Pelvis is unremarkable. IMPRESSION: 1. 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 Diverticular changes throughout decompressed bowel without definite focal inflammation. There is some wall thickening in the sigmoid colon. 2. Existing drainage catheters are not associated with any significant residual fluid collections. 3. Three separate fluid  collections are increasing, as described. 4.  Aortic Atherosclerosis (ICD10-I70.0). 5. Bibasilar airspace disease is likely atelectasis. Electronically Signed   By: San Morelle M.D.   On: 05/07/2018 13:54   Vas [place] Upper Extremity Venous Duplex  Result Date: 05/08/2018 UPPER VENOUS STUDY  Indications: Follow up of DVT found in one of the brachial veins 04/15/18 Performing Technologist: Sharion Dove RVS  Examination Guidelines: A complete evaluation includes B-mode imaging, spectral Doppler, color Doppler, and power Doppler as needed of all accessible portions of each vessel. Bilateral testing is considered an integral part of a complete examination. Limited examinations for reoccurring indications may be performed as noted.  Right Findings: +----------+------------+----------+---------+-----------+-----------------+ RIGHT     CompressiblePropertiesPhasicitySpontaneous     Summary      +----------+------------+----------+---------+-----------+-----------------+ IJV           Full                 Yes       Yes                      +----------+------------+----------+---------+-----------+-----------------+ Subclavian                         Yes       Yes                      +----------+------------+----------+---------+-----------+-----------------+ Axillary      None                                  Age  Indeterminate +----------+------------+----------+---------+-----------+-----------------+ Brachial    Partial                                 Age Indeterminate +----------+------------+----------+---------+-----------+-----------------+ Cephalic      Full                                                    +----------+------------+----------+---------+-----------+-----------------+ Basilic       Full                                                    +----------+------------+----------+---------+-----------+-----------------+  Left Findings: +----------+------------+----------+---------+-----------+-------+ LEFT      CompressiblePropertiesPhasicitySpontaneousSummary +----------+------------+----------+---------+-----------+-------+ Subclavian                         Yes       Yes            +----------+------------+----------+---------+-----------+-------+  Summary:  Right: Findings consistent with age indeterminate deep vein thrombosis involving the right brachial veins and right axillary vein. DVT found in one of the brachial veins, 04/15/18, remains. DVT appears to have propagated in to the axillary vein.  Left: No evidence of thrombosis in the subclavian.  *See table(s) above for measurements and observations.  Diagnosing physician: Servando Snare MD Electronically signed by Servando Snare MD on 05/08/2018 at 6:00:08 PM.    Final    Ir Picc Replacement Leftinc Imgguide  Result Date: 05/19/2018 INDICATION: Malfunctioning PICC line.  Diverticular abscess. Need to continue IV antibiotics. EXAM: FLUOROSCOPIC GUIDED PICC LINE EXCHANGE TECHNIQUE: The procedure, risks, benefits, and alternatives were explained to the patient and informed written consent was obtained. The left upper extremity and external portion of the existing PICC line was prepped with chlorhexidine in a sterile fashion, and a sterile drape was applied covering the operative field. Maximum barrier sterile technique  with sterile gowns and gloves were used for the procedure. A timeout was performed prior to the initiation of the procedure. Local anesthesia was provided with 1% lidocaine. The existing PICC line was cannulated with an 0.0018 wire which was advanced through the catheter. The catheter was exchanged for a peel-away sheath, ultimately allowing advancement of a 46-cm, 5- [place], double lumen PICC line to the level of the superior caval atrial junction. A post procedure spot fluoroscopic image was obtained. The catheter easily aspirated and flushed and was secured in place. A dressing was placed. The patient tolerated the procedure well without immediate post procedural complication. FINDINGS: After catheter exchange, the tip lies within the superior cavoatrial junction. The catheter aspirates and flushes normally, it was capped, and is ready for immediate use. IMPRESSION: Successful fluoroscopic guided exchange of left upper extremity approach 46 cm, 5 - [place], double lumen PICC with tip overlying the superior caval atrial junction. The PICC line is ready for immediate use. Read by: Gareth Eagle, PA-C Electronically Signed   By: Markus Daft M.D.   On: 05/19/2018 13:50    Assessment/Plan  #1- low magnesium levels- magnesium level is only 0.7- this is considered a critical value-- clinically she appears to be stable-she does have some irregularity of her heart rate but is not complaining of any chest pain or discomfort she does have some history of atrial tachycardia h- will send her to the ER for expedient evaluation I suspect she will need IV magnesium and monitoring--- at this point her discharge will be delayed.  2.  History of sigmoid diverticulitis with perforation and multiple abdominal abscesses with an open surgical wound-she continues with her drains is also on vancomycin and Zosyn as well as Diflucan- has had a wound VAC as well.  3.  Diabetes type 2 she is on sliding scale this appears to be well  controlled.  Morning sugars largely been in the 90s to low 100s- baseline appears to be more low to mid 100s at 5 PM later in the day.  [id number]

## 2018-06-06 NOTE — Discharge Instructions (Addendum)
Patient's magnesium corrected here with IV magnesium up to 1.6.  Discussed with internal medicine hospitalist and they recommend starting her on mag oxide a magnesium supplement 200 mg a day.  Also recommend rechecking her magnesium over the next few days.  Patient also here in atrial fibrillation.  Hospital records show that the end of November she was in atrial fibrillation then.  Continue her calcium channel blocker and her beta-blocker.  However her Coumadin level is not therapeutic this will need to be followed up.  Patient is scheduled to be receiving Coumadin each day.  Is important that she continues this and gets therapeutic.  Return for any new or worse symptoms.  Here patient received 10 mg of diltiazem IV which brought her heart rate down into the 90s.

## 2018-06-06 NOTE — ED Triage Notes (Signed)
When pt was placed on cardiac monitor, pt's heart rate was noted to be fluctuating between 100-140bpm. Rate irregular. Pt denies heart palpitations, SOB. EKG performed.

## 2018-06-06 NOTE — ED Provider Notes (Addendum)
[facility] EMERGENCY DEPARTMENT Provider Note   CSN: [id number] Arrival date & time: 06/06/18  1047     History   Chief Complaint Chief Complaint  Patient presents with  . Abnormal Labs    HPI [name] is a 72 y.o. female.  Patient sent in from the [facility].  Patient has been there since early December following an extensive hospitalization due to complications from diverticulitis.  That resulted in receiving a diverting ileostomy.  Also wound VAC.  Patient had developed multiple abscesses.  Patient sent in from the [facility] for concerns for potassium being a little low and for magnesium getting lower.  Patient had developed low magnesium when she was hospitalized and had received IV magnesium and they were to follow that up at the [facility] which they have been doing and it has been slowly dropping.  Potassium is just slightly below normal.  Patient also with concerns for palpitations.  Patient denied any history of any known cardiac arrhythmias.  But prior EKG at the end of November shows evidence of atrial fibrillation.  Patient is on a beta-blocker patient is on calcium channel blocker she is also hypertensive.  And patient is supposed to be on Coumadin.  Patient has a PICC line in her left arm.  She still receiving IV antibiotics.  She was due to be discharged home on Monday from the [facility].  Patient without any specific complaints today other than the feeling of some palpitations.  Cardiac monitoring upon arrival consistent with atrial fibrillation with heart rate sometimes getting up to the 130s.  Patient did not have any of her morning medications.    Patient's husband also concerned about her ostomy bag and the way that is fitting she is got some skin breakdown.  He states they are not using the wafers like they were when she was in the hospital.  And that is doing some leaking.  [place] apparently did not have the proper supplies.  He was wondering if we  could get a consultation from an ostomy nurse.     Past Medical History:  Diagnosis Date  . Anginal pain [facility])    sees Dr Einar Gip.   . Arthritis    rheumatoid ..   . Diabetes mellitus without complication (HCC)    Metformin 2.3.2017  . Diverticulitis   . Diverticulitis of large intestine with perforation and abscess 04/05/2018  . Dysrhythmia   . Fibromyalgia   . GERD (gastroesophageal reflux disease)   . High cholesterol   . Hypertension   . Liver disease 08- 2012   per Dr Dagmar Hait pt has enlarged liver  . Peripheral vascular disease (HCC)    legs  . Pneumonia 06/17/2016  . Sleep apnea    sleep study  oct 2012  . Stroke [facility]) 2011   [facility]      Patient Active Problem List   Diagnosis Date Noted  . Abscess of sigmoid colon due to diverticulitis   . Acute renal failure superimposed on stage 3 chronic kidney disease ([place]) 04/08/2018  . CKD (chronic kidney disease) stage 3, GFR 30-59 ml/min (HCC) 04/06/2018  . Fibromyalgia 04/06/2018  . Diverticulitis of large intestine with perforation and abscess 04/05/2018  . Anemia 04/05/2018  . Esophageal dysphagia 01/03/2018  . Rheumatoid arthritis ([place]) 06/27/2016  . Carpal tunnel syndrome 03/24/2014  . Paresthesias/numbness 03/24/2014  . Obesity, unspecified 09/23/2013  . Pulmonary nodules 07/03/2013  . TIA (transient ischemic attack) 06/20/2013  . Left  arm weakness 06/20/2013  . DM (diabetes mellitus) ([place]) 06/20/2013  . HTN (hypertension) 06/20/2013  . Chronic pain 06/20/2013  . Vitreous hemorrhage ([place]) 03/25/2012    Past Surgical History:  Procedure Laterality Date  . [place]  . CHOLECYSTECTOMY  1996  . COLONOSCOPY N/A 02/13/2018   Procedure: COLONOSCOPY;  Surgeon: Rogene Houston, MD;  Location: [facility] ENDO SUITE;  Service: Endoscopy;  Laterality: N/A;  8:30  . CORONARY ARTERY BYPASS GRAFT  1986  . ESOPHAGEAL DILATION N/A 01/13/2018   Procedure: ESOPHAGEAL DILATION;  Surgeon: Rogene Houston, MD;  Location: [facility] ENDO SUITE;  Service: Endoscopy;  Laterality: N/A;  . ESOPHAGOGASTRODUODENOSCOPY N/A 01/13/2018   Procedure: ESOPHAGOGASTRODUODENOSCOPY (EGD);  Surgeon: Rogene Houston, MD;  Location: [facility] ENDO SUITE;  Service: Endoscopy;  Laterality: N/A;  . EYE SURGERY  2013   cat ext bilateral .  . EYE SURGERY  2002   laser  . FOREIGN BODY REMOVAL  02/13/2018   Procedure: FOREIGN BODY REMOVAL;  Surgeon: Rogene Houston, MD;  Location: [facility] ENDO SUITE;  Service: Endoscopy;;  foreign body removal which appears to be food debri in a stem form removed from colon by DR. Rehman  . [place]- 2007   rod right leg - right wrist plate  . GAS/FLUID EXCHANGE  03/25/2012   Procedure: GAS/FLUID EXCHANGE;  Surgeon: Hayden Pedro, MD;  Location: [place];  Service: Ophthalmology;  Laterality: Right;  . LAPAROTOMY N/A 04/30/2018   Procedure: EXPLORATORY LAPAROTOMY  LYSIS OF ADHESIONS SMALL BOWEL RESECTION WITH ANASTOMSIS LOOP ILEOSTOMY PLACEMENT OF DRAINS AND WOUND [place];  Surgeon: Donnie Mesa, MD;  Location: [place];  Service: General;  Laterality: N/A;  . PARS PLANA VITRECTOMY  03/25/2012   Procedure: PARS PLANA VITRECTOMY WITH 25 GAUGE;  Surgeon: Hayden Pedro, MD;  Location: [place];  Service: Ophthalmology;  Laterality: Right;     OB History   No obstetric history on file.      Home Medications    Prior to Admission medications   Medication Sig Start Date End Date Taking? Authorizing Provider  ALPRAZolam Duanne Moron) 0.25 MG tablet Take 0.25 mg by mouth daily as needed for anxiety. Take for 14 days from 05/26/2018-06/08/2018    [provider]  Blood Glucose Monitoring Suppl (FREESTYLE LITE) DEVI See admin instructions. 12/22/14   [provider]  clopidogrel (PLAVIX) 75 MG tablet Take 1 tablet (75 mg total) by mouth daily with breakfast. 01/14/18   Rehman, Mechele Dawley, MD  Dexlansoprazole 30 MG capsule Take 30 mg by mouth daily.     [provider]  diclofenac  sodium (VOLTAREN) 1 % GEL Apply 2 g topically 4 (four) times daily as needed (Pain).     [provider]  diltiazem (CARDIZEM CD) 120 MG 24 hr capsule Take 1 capsule (120 mg total) by mouth daily. 05/13/18 05/13/19  Thurnell Lose, MD  ferrous sulfate 325 (65 FE) MG tablet Take 1 tablet (325 mg total) by mouth 2 (two) times daily with a meal. 05/13/18   Thurnell Lose, MD  fluconazole (DIFLUCAN) 200 MG tablet Take 2 tablets (400 mg total) by mouth daily. 05/14/18   Thurnell Lose, MD  HYDROcodone-acetaminophen (NORCO) 10-325 MG tablet Take 1 tablet by mouth 4 (four) times daily. 05/28/18   [place]an[place] Lewis C, PA-C  insulin aspart (NOVOLOG FLEXPEN) 100 UNIT/ML FlexPen Give per sliding scale 2 times a day, 140-199 - 2 units, 200-250 - 4 units,  251-299 - 6 units,  300-349 - 8 units,  350 or above 10 units. Dispense syringes and needles as needed, Ok to switch to PEN if approved. Substitute to any brand approved. DX DM2, Code E11.65    [provider]  isosorbide dinitrate (ISORDIL) 5 MG tablet Take 1 tablet (5 mg total) by mouth 3 (three) times daily before meals. 02/07/18   Rehman, Mechele Dawley, MD  leflunomide (ARAVA) 20 MG tablet Take 20 mg by mouth daily.    [provider]  metoprolol succinate (TOPROL-XL) 25 MG 24 hr tablet Take 25 mg by mouth daily.    [provider]  PARoxetine (PAXIL) 20 MG tablet Take 20 mg by mouth daily.    [provider]  piperacillin-tazobactam (ZOSYN) IVPB Inject 3.375 g into the vein every 6 (six) hours. Indication:  Intraabdominal abscess Last Day of Therapy:  06/07/18 Labs - Once weekly:  CBC/D and BMP, Labs - Every other week:  ESR and CRP 05/13/18   Thurnell Lose, MD  potassium chloride SA (K-DUR,KLOR-CON) 20 MEQ tablet Take 20 mEq by mouth daily.    [provider]  pravastatin (PRAVACHOL) 40 MG tablet Take 40 mg by mouth at bedtime.    [provider]  predniSONE (DELTASONE) 5 MG tablet Take 5 mg by  mouth daily with breakfast.    [provider]  pregabalin (LYRICA) 50 MG capsule Take 1 capsule (50 mg total) by mouth 3 (three) times daily. 05/26/18   Oscar La, Arlo C, PA-C  Tafluprost (ZIOPTAN) 0.0015 % SOLN Place 1 drop into both eyes at bedtime.     [provider]  temazepam (RESTORIL) 30 MG capsule Take 1 capsule (30 mg total) by mouth at bedtime. 05/14/18   Granville Lewis C, PA-C  timolol (BETIMOL) 0.5 % ophthalmic solution Place 1 drop into both eyes 2 (two) times daily.     [provider]  Tofacitinib Citrate (XELJANZ) 5 MG TABS Take 1 tablet by mouth daily.    [provider]  vancomycin 1,000 mg in sodium chloride 0.9 % 250 mL Inject 1,000 mg into the vein every evening.    [provider]  VITAMIN D, ERGOCALCIFEROL, PO Take 50,000 Units by mouth once a week. Thursdays    [provider]  warfarin (COUMADIN) 1 MG tablet Take 1 mg by mouth daily. On Mon., Wed., Fri.    [provider]  warfarin (COUMADIN) 1 MG tablet Take 1.5 mg by mouth daily. Tues., Thurs., Sat.    [provider]    Family History Family History  Problem Relation Age of Onset  . Hypertension Daughter   . Rheum arthritis Maternal Grandmother     Social History Social History   Tobacco Use  . Smoking status: Former Smoker    Packs/day: 0.25    Years: 5.00    Pack years: 1.25    Types: Cigarettes    Last attempt to quit: 06/11/1984    Years since quitting: 34.0  . Smokeless tobacco: Never Used  Substance Use Topics  . Alcohol use: Yes    Comment: occ  . Drug use: No    Types: Hydrocodone     Allergies   Lactose intolerance (gi); Butrans [buprenorphine]; and Simvastatin   Review of Systems Review of Systems  Constitutional: Negative for fever.  HENT: Negative for congestion.   Eyes: Negative for redness.  Respiratory: Negative for shortness of breath.   Cardiovascular: Positive for palpitations. Negative for chest pain.  Gastrointestinal: Positive for abdominal pain. Negative for diarrhea.  Genitourinary: Negative for dysuria.  Musculoskeletal: Negative for back pain.  Skin: Positive for wound.  Neurological: Negative for headaches.  Hematological: Does not bruise/bleed easily.  Psychiatric/Behavioral: Negative for confusion.     Physical Exam Updated Vital Signs BP (!) 140/95   Pulse 89   Temp 97.9 F (36.6 C) (Oral)   Resp 13   Ht 1.676 m (5' 6" )   Wt 103.4 kg   SpO2 95%   BMI 36.80 kg/m   Physical Exam Vitals signs and nursing note reviewed.  Constitutional:      Appearance: Normal appearance.  HENT:     Head: Normocephalic and atraumatic.     Mouth/Throat:     Mouth: Mucous membranes are moist.  Eyes:     Extraocular Movements: Extraocular movements intact.     Pupils: Pupils are equal, round, and reactive to light.  Neck:     Musculoskeletal: Normal range of motion and neck supple.  Cardiovascular:     Rate and Rhythm: Tachycardia present. Rhythm irregular.  Pulmonary:     Effort: Pulmonary effort is normal. No respiratory distress.     Breath sounds: Normal breath sounds.  Abdominal:     General: Abdomen is flat.     Comments: Large open midline wound with wound VAC in place.  Ileostomy bag right upper quadrant.  No leakage.  Filled mostly with bile.  Reportedly there is some skin irritation up underneath the adhesive part of the bag.  Does not appear that patient has a wafer in place.  Musculoskeletal: Normal range of motion.  Skin:    General: Skin is warm.  Neurological:     General: No focal deficit present.     Mental Status: She is alert and oriented to person, place, and time.      ED Treatments / Results  Labs (all labs ordered are listed, but only abnormal results are displayed) Labs Reviewed  CBC WITH DIFFERENTIAL/PLATELET - Abnormal; Notable for the following components:      Result Value   RBC 3.22 (*)    Hemoglobin 10.0 (*)    HCT 33.6 (*)    MCV  104.3 (*)    MCHC 29.8 (*)    RDW 21.4 (*)    Lymphs Abs 0.4 (*)    Basophils Absolute 0.2 (*)    All other components within normal limits  PROTIME-INR - Abnormal; Notable for the following components:   Prothrombin Time 20.3 (*)    All other components within normal limits  MAGNESIUM    EKG EKG Interpretation  Date/Time:  Friday June 06 2018 11:01:55 EST Ventricular Rate:  123 PR Interval:    QRS Duration: 95 QT Interval:  340 QTC Calculation: 487 R Axis:   131 Text Interpretation:  Atrial fibrillation Low voltage, extremity and precordial leads Consider right ventricular hypertrophy Consider anterolateral infarct No significant change since last tracing Confirmed by Fredia Sorrow [phone]) on 06/06/2018 11:50:41 AM   Radiology No results found.  Procedures Procedures (including critical care time)  CRITICAL CARE Performed by: Fredia Sorrow Total critical care time: 30 minutes Critical care time was exclusive of separately billable procedures and treating other patients. Critical care was necessary to treat or prevent imminent or life-threatening deterioration. Critical care was time spent personally by me on the following activities: development of treatment plan with patient and/or surrogate as well as nursing, discussions with consultants, evaluation of patient's response to treatment, examination of patient, obtaining  history from patient or surrogate, ordering and performing treatments and interventions, ordering and review of laboratory studies, ordering and review of radiographic studies, pulse oximetry and re-evaluation of patient's condition.   Medications Ordered in ED Medications  0.9 %  sodium chloride infusion ( Intravenous New Bag/Given 06/06/18 1240)  magnesium sulfate IVPB 2 g 50 mL ( Intravenous Stopped 06/06/18 1342)  diltiazem (CARDIZEM) injection 10 mg (10 mg Intravenous Given 06/06/18 1234)     Initial Impression / Assessment and Plan / ED  Course  I have reviewed the triage vital signs and the nursing notes.  Pertinent labs & imaging results that were available during my care of the patient were reviewed by me and considered in my medical decision making (see chart for details).     Patient's magnesium low here.  Patient received 2 g of magnesium IV.  Potassium not significantly low.  Patient's cardiac monitoring showed atrial fibrillation with some rapid heart rate.  Patient received 10 mg of diltiazem a.m. with heart rate now down to around 100.  Patient's labs from earlier this morning in our system were reviewed.  Patient hemoglobin without any significant abnormalities.  Patient magnesium was 0.7.  Clearly been drifting down over the last month.  Repeat magnesium ordered after patient received IV magnesium.  If it still significantly low may need to discuss with hospitalist about direction whether they can be treated with oral supplementation or whether she requires admission.  Patient referred to go back to the [facility].  Also had ostomy nurse to a consultation she is recommending new supplies.  They will get the supplies to the [facility].  Regarding the atrial fibrillation we have better heart rate control with the diltiazem.  Patient is on a calcium channel blocker as well as a beta-blocker normally.  Patient's INR though is not therapeutic.  According to her notes she is supposed to be on Coumadin.  Patient thinks they have held it for the last 3 days.  We will make sure patient gets Coumadin restarted if she goes back to the [facility].  Patient's magnesium came back at 1.6.  1.7 is normal range.  Think patient is probably okay to go back to [facility].  Will discuss with internal medicine before sending her back.  To see if there is any additional recommendations.  Final Clinical Impressions(s) / ED Diagnoses   Final diagnoses:  Hypomagnesemia  Hypokalemia  Atrial fibrillation with [facility])  Disorder of stoma     ED Discharge Orders    None       Fredia Sorrow, MD 06/06/18 1508    Fredia Sorrow, MD 06/06/18 1545  Hospitalist recommended starting magnesium supplement 200 mg a day.  Patient may require more close follow-up of her magnesium levels by the [facility].  [place] also have to make sure she gets back on her Coumadin.  And she gets therapeutic.  Patient's atrial fibrillation fairly well controlled here.  Patient needs to continue to receive her beta-blocker and calcium channel blocker.  In addition patient may be on the Coumadin due to concerns for DVT in 1 of her upper extremities from reading through the notes.  But not clear.  Certainly needs to continue the Coumadin in the face of the atrial fibrillation.    Fredia Sorrow, MD 06/06/18 1608    Fredia Sorrow, MD 06/06/18 [phone]

## 2018-06-06 NOTE — Consult Note (Signed)
[place] Nurse ostomy follow up Patient is receiving care in [facility] ED room 16.  She is a well known ostomate formerly one of the [place] team's patients. Per the primary RN, Magda Paganini, the patient is arriving from the [facility] for electrolyte imbalances.  Orders for specific supplies, step-by-step instructions for crusting, use of barrier rings, and pouching have been entered. Please order and keep the following ostomy supplies at the bedside:  Stoma Powder Kellie Simmering #6); Barrier Rings Kellie Simmering [phone]); 1 piece convex ostomy pouch Kellie Simmering [phone]); Skin Barrier Wipes Kellie Simmering [phone]); Ostomy Belt Kellie Simmering [phone]).  Thank you for the consult.  Discussed plan of care with the bedside nurse.  [place] nurse will not follow at this time.  Please re-consult the [place] team if needed.  Val Riles, RN, MSN, CWOCN, CNS-BC, pager [phone]

## 2018-06-06 NOTE — ED Notes (Signed)
Unable to pull enough blood from PICC to obtain labs.  EDP notified and ok for lab to stick below picc line.

## 2018-06-06 NOTE — ED Triage Notes (Signed)
Pt brought over from [facility]. Pt's labwork showed Potassium 3.4 and Magnesium 0.7. Pt was also showing an irregular heart rhythm with a rate of 100bpm. Pt was sent over by PA from [facility] due to these findings. Pt is alert and oriented. Pt had recent small bowel resection due to diverticulitis. Pt has several drains and wound vac to abdomen.

## 2018-06-07 ENCOUNTER — Encounter (HOSPITAL_COMMUNITY)
Admission: RE | Admit: 2018-06-07 | Discharge: 2018-06-07 | Disposition: A | Discharge status: Home / Self Care | Payer: Medicare Other | Source: Skilled Nursing Facility

## 2018-06-07 LAB — PROTIME-INR
INR: 1.99
Prothrombin Time: 22.3 seconds — ABNORMAL HIGH (ref 11.4–15.2)

## 2018-06-08 ENCOUNTER — Emergency Department (HOSPITAL_COMMUNITY): Payer: Medicare Other

## 2018-06-08 ENCOUNTER — Inpatient Hospital Stay
Admission: RE | Admit: 2018-06-08 | Discharge: 2018-06-10 | Disposition: A | Discharge status: Nursing Home (Includes ICF) | Payer: Medicare Other | Source: Ambulatory Visit | Attending: Internal Medicine | Admitting: Internal Medicine

## 2018-06-08 ENCOUNTER — Other Ambulatory Visit: Payer: Self-pay

## 2018-06-08 ENCOUNTER — Encounter (HOSPITAL_COMMUNITY): Payer: Self-pay

## 2018-06-08 ENCOUNTER — Emergency Department (HOSPITAL_COMMUNITY)
Admission: EM | Admit: 2018-06-08 | Discharge: 2018-06-08 | Disposition: A | Discharge status: Home / Self Care | Payer: Medicare Other | Attending: Emergency Medicine | Admitting: Emergency Medicine

## 2018-06-08 DIAGNOSIS — Y999 Unspecified external cause status: Secondary | ICD-10-CM | POA: Insufficient documentation

## 2018-06-08 DIAGNOSIS — Z8673 Personal history of transient ischemic attack (TIA), and cerebral infarction without residual deficits: Secondary | ICD-10-CM | POA: Insufficient documentation

## 2018-06-08 DIAGNOSIS — N183 Chronic kidney disease, stage 3 (moderate): Secondary | ICD-10-CM | POA: Diagnosis not present

## 2018-06-08 DIAGNOSIS — Z7901 Long term (current) use of anticoagulants: Secondary | ICD-10-CM | POA: Diagnosis not present

## 2018-06-08 DIAGNOSIS — E1122 Type 2 diabetes mellitus with diabetic chronic kidney disease: Secondary | ICD-10-CM | POA: Insufficient documentation

## 2018-06-08 DIAGNOSIS — S0990XA Unspecified injury of head, initial encounter: Secondary | ICD-10-CM | POA: Diagnosis not present

## 2018-06-08 DIAGNOSIS — W19XXXA Unspecified fall, initial encounter: Secondary | ICD-10-CM

## 2018-06-08 DIAGNOSIS — S199XXA Unspecified injury of neck, initial encounter: Secondary | ICD-10-CM | POA: Diagnosis not present

## 2018-06-08 DIAGNOSIS — Z87891 Personal history of nicotine dependence: Secondary | ICD-10-CM | POA: Insufficient documentation

## 2018-06-08 DIAGNOSIS — Y92122 Bedroom in nursing home as the place of occurrence of the external cause: Secondary | ICD-10-CM | POA: Insufficient documentation

## 2018-06-08 DIAGNOSIS — Z794 Long term (current) use of insulin: Secondary | ICD-10-CM | POA: Insufficient documentation

## 2018-06-08 DIAGNOSIS — Z7902 Long term (current) use of antithrombotics/antiplatelets: Secondary | ICD-10-CM | POA: Insufficient documentation

## 2018-06-08 DIAGNOSIS — Z79899 Other long term (current) drug therapy: Secondary | ICD-10-CM | POA: Insufficient documentation

## 2018-06-08 DIAGNOSIS — W01198A Fall on same level from slipping, tripping and stumbling with subsequent striking against other object, initial encounter: Secondary | ICD-10-CM | POA: Insufficient documentation

## 2018-06-08 DIAGNOSIS — Y9389 Activity, other specified: Secondary | ICD-10-CM | POA: Insufficient documentation

## 2018-06-08 LAB — CBC WITH DIFFERENTIAL/PLATELET
Abs Immature Granulocytes: 0.03 10*3/uL (ref 0.00–0.07)
BASOS PCT: 2 %
Basophils Absolute: 0.1 10*3/uL (ref 0.0–0.1)
Eosinophils Absolute: 0.4 10*3/uL (ref 0.0–0.5)
Eosinophils Relative: 5 %
HCT: 34.9 % — ABNORMAL LOW (ref 36.0–46.0)
Hemoglobin: 10.3 g/dL — ABNORMAL LOW (ref 12.0–15.0)
Immature Granulocytes: 0 %
Lymphocytes Relative: 7 %
Lymphs Abs: 0.5 10*3/uL — ABNORMAL LOW (ref 0.7–4.0)
MCH: 30.7 pg (ref 26.0–34.0)
MCHC: 29.5 g/dL — AB (ref 30.0–36.0)
MCV: 103.9 fL — ABNORMAL HIGH (ref 80.0–100.0)
Monocytes Absolute: 0.8 10*3/uL (ref 0.1–1.0)
Monocytes Relative: 11 %
Neutro Abs: 5.3 10*3/uL (ref 1.7–7.7)
Neutrophils Relative %: 75 %
Platelets: 377 10*3/uL (ref 150–400)
RBC: 3.36 MIL/uL — ABNORMAL LOW (ref 3.87–5.11)
RDW: 21.2 % — ABNORMAL HIGH (ref 11.5–15.5)
WBC: 7.1 10*3/uL (ref 4.0–10.5)
nRBC: 0 % (ref 0.0–0.2)

## 2018-06-08 LAB — BASIC METABOLIC PANEL
Anion gap: 9 (ref 5–15)
BUN: 15 mg/dL (ref 8–23)
CO2: 18 mmol/L — ABNORMAL LOW (ref 22–32)
Calcium: 8.5 mg/dL — ABNORMAL LOW (ref 8.9–10.3)
Chloride: 113 mmol/L — ABNORMAL HIGH (ref 98–111)
Creatinine, Ser: 1.06 mg/dL — ABNORMAL HIGH (ref 0.44–1.00)
GFR calc Af Amer: >60 mL/min (ref >60)
GFR, EST NON AFRICAN AMERICAN: 52 mL/min — AB (ref >60)
Glucose, Bld: 138 mg/dL — ABNORMAL HIGH (ref 70–99)
Potassium: 4.3 mmol/L (ref 3.5–5.1)
Sodium: 140 mmol/L (ref 135–145)

## 2018-06-08 LAB — MAGNESIUM: Magnesium: 1.2 mg/dL — ABNORMAL LOW (ref 1.7–2.4)

## 2018-06-08 LAB — PROTIME-INR
INR: 1.91
Prothrombin Time: 21.6 seconds — ABNORMAL HIGH (ref 11.4–15.2)

## 2018-06-08 MED ORDER — MAGNESIUM OXIDE -MG SUPPLEMENT 400 (240 MG) MG PO TABS: 400.0000 mg | ORAL_TABLET | Freq: Every day | ORAL | 0 refills | Status: DC

## 2018-06-08 MED ORDER — WARFARIN SODIUM 1 MG PO TABS: 3.0000 mg | ORAL_TABLET | Freq: Every day | ORAL | 0 refills | Status: DC

## 2018-06-08 NOTE — ED Notes (Signed)
Tried to draw blood back from PICC line. Unsuccessful. Will let phlebotomy know

## 2018-06-08 NOTE — Discharge Instructions (Addendum)
It is important to always get help when you stand up to prevent a fall.  Make sure you are eating 3 meals a day and drinking plenty of fluids.  We are increasing your Coumadin level for 2 days, and your magnesium dosing, for 30 days.  Follow-up with your primary care doctor for recheck of your INR and magnesium/potassium levels in 3 to 5 days.

## 2018-06-08 NOTE — ED Notes (Signed)
Pt has been discharged. Waiting for NT from [facility] to come pick up patient.

## 2018-06-08 NOTE — ED Triage Notes (Addendum)
Pt sent from [facility]. Pt stood up and fell back hitting head. Pt reports that her knees buckled. Denies LOC. Noted to have hematoma to right side of head. Pt is on coumadin and plavix

## 2018-06-08 NOTE — ED Notes (Signed)
Right sided hematoma to the back of head. NAD. PICC line in place

## 2018-06-08 NOTE — ED Notes (Signed)
Have given pt water to drink  

## 2018-06-08 NOTE — ED Provider Notes (Signed)
[facility] EMERGENCY DEPARTMENT Provider Note   CSN: [id number] Arrival date & time: 06/08/18  1106     History   Chief Complaint Chief Complaint  Patient presents with  . Fall    HPI [name] is a 72 y.o. female.  HPI   She is here for evaluation of a mechanical fall.  She states she was putting on her pants when she lost her balance, fell backwards.  She thinks this is related to her ongoing leg weakness for which she is in a assisted living facility to get physical therapy.  She hit her head when she fell but denies headache, neck pain or back pain.  She denies chest pain, shortness of breath, abdominal pain, focal weakness or paresthesia.  She was in the ED several days ago with low potassium and magnesium, received treatment and discharge medication.  There are no other known modifying factors.  Past Medical History:  Diagnosis Date  . Anginal pain [facility])    sees Dr Einar Gip.   . Arthritis    rheumatoid ..   . Diabetes mellitus without complication (HCC)    Metformin 2.3.2017  . Diverticulitis   . Diverticulitis of large intestine with perforation and abscess 04/05/2018  . Dysrhythmia   . Fibromyalgia   . GERD (gastroesophageal reflux disease)   . High cholesterol   . Hypertension   . Liver disease 08- 2012   per Dr Dagmar Hait pt has enlarged liver  . Peripheral vascular disease (HCC)    legs  . Pneumonia 06/17/2016  . Sleep apnea    sleep study  oct 2012  . Stroke [facility]) 2011   [facility] [place]      Patient Active Problem List   Diagnosis Date Noted  . Abscess of sigmoid colon due to diverticulitis   . Acute renal failure superimposed on stage 3 chronic kidney disease ([place]) 04/08/2018  . CKD (chronic kidney disease) stage 3, GFR 30-59 ml/min (HCC) 04/06/2018  . Fibromyalgia 04/06/2018  . Diverticulitis of large intestine with perforation and abscess 04/05/2018  . Anemia 04/05/2018  . Esophageal dysphagia 01/03/2018  . Rheumatoid arthritis ([place]) 06/27/2016   . Carpal tunnel syndrome 03/24/2014  . Paresthesias/numbness 03/24/2014  . Obesity, unspecified 09/23/2013  . Pulmonary nodules 07/03/2013  . TIA (transient ischemic attack) 06/20/2013  . Left arm weakness 06/20/2013  . DM (diabetes mellitus) ([place]) 06/20/2013  . HTN (hypertension) 06/20/2013  . Chronic pain 06/20/2013  . Vitreous hemorrhage ([place]) 03/25/2012    Past Surgical History:  Procedure Laterality Date  . [place]  . CHOLECYSTECTOMY  1996  . COLONOSCOPY N/A 02/13/2018   Procedure: COLONOSCOPY;  Surgeon: Rogene Houston, MD;  Location[facility];  Service: Endoscopy;  Laterality: N/A;  8:30  . CORONARY ARTERY BYPASS GRAFT  1986  . ESOPHAGEAL DILATION N/A 01/13/2018   Procedure: ESOPHAGEAL DILATION;  Surgeon: Rogene Houston, MD;  Location: [facility] ENDO SUITE;  Service: Endoscopy;  Laterality: N/A;  . ESOPHAGOGASTRODUODENOSCOPY N/A 01/13/2018   Procedure: ESOPHAGOGASTRODUODENOSCOPY (EGD);  Surgeon: Rogene Houston, MD;  Location: [facility] END[facility];  Service: Endoscopy;  Laterality: N/A;  . EYE SURGERY  2013   cat ext bilateral .  . EYE SURGERY  2002   laser  . FOREIGN BODY REMOVAL  02/13/2018   Procedure: FOREIGN BODY REMOVAL;  Surgeon: Rogene Houston, MD;  Location: [facility];  Service: Endoscopy;;  foreign body removal which appears to be food debri in a stem form  removed from colon by DR. Rehman  . [place]- 2007   rod right leg - right wrist plate  . GAS/FLUID EXCHANGE  03/25/2012   Procedure: GAS/FLUID EXCHANGE;  Surgeon: Hayden Pedro, MD;  Location: [place];  Service: Ophthalmology;  Laterality: Right;  . LAPAROTOMY N/A 04/30/2018   Procedure: EXPLORATORY LAPAROTOMY  LYSIS OF ADHESIONS SMALL BOWEL RESECTION WITH ANASTOMSIS LOOP ILEOSTOMY PLACEMENT OF DRAINS AND WOUND [place];  Surgeon: Donnie Mesa, MD;  Location: [place];  Service: General;  Laterality: N/A;  . PARS PLANA VITRECTOMY  03/25/2012   Procedure: PARS PLANA VITRECTOMY  WITH 25 GAUGE;  Surgeon: Hayden Pedro, MD;  Location: [place];  Service: Ophthalmology;  Laterality: Right;     OB History   No obstetric history on file.      Home Medications    Prior to Admission medications   Medication Sig Start Date End Date Taking? Authorizing Provider  ALPRAZolam Duanne Moron) 0.25 MG tablet Take 0.25 mg by mouth daily as needed for anxiety. Take for 14 days from 05/26/2018-06/08/2018   Yes [provider]  clopidogrel (PLAVIX) 75 MG tablet Take 1 tablet (75 mg total) by mouth daily with breakfast. 01/14/18  Yes Rehman, Mechele Dawley, MD  Dexlansoprazole 30 MG capsule Take 30 mg by mouth daily.    Yes [provider]  diclofenac sodium (VOLTAREN) 1 % GEL Apply 2 g topically 4 (four) times daily as needed (Pain).    Yes [provider]  diltiazem (CARDIZEM CD) 120 MG 24 hr capsule Take 1 capsule (120 mg total) by mouth daily. 05/13/18 05/13/19 Yes Thurnell Lose, MD  ferrous sulfate 325 (65 FE) MG tablet Take 1 tablet (325 mg total) by mouth 2 (two) times daily with a meal. 05/13/18  Yes Thurnell Lose, MD  fluconazole (DIFLUCAN) 200 MG tablet Take 2 tablets (400 mg total) by mouth daily. 05/14/18  Yes Thurnell Lose, MD  HYDROcodone-acetaminophen (NORCO) 10-325 MG tablet Take 1 tablet by mouth 4 (four) times daily. 05/28/18  Yes Lassen, Arlo C, PA-C  insulin aspart (NOVOLOG FLEXPEN) 100 UNIT/ML FlexPen Give per sliding scale 2 times a day, 140-199 - 2 units, 200-250 - 4 units, 251-299 - 6 units,  300-349 - 8 units,  350 or above 10 units. Dispense syringes and needles as needed, Ok to switch to PEN if approved. Substitute to any brand approved. DX DM2, Code E11.65   Yes [provider]  isosorbide dinitrate (ISORDIL) 5 MG tablet Take 1 tablet (5 mg total) by mouth 3 (three) times daily before meals. 02/07/18  Yes Rehman, Mechele Dawley, MD  leflunomide (ARAVA) 20 MG tablet Take 20 mg by mouth daily.   Yes [provider]  metoprolol  succinate (TOPROL-XL) 25 MG 24 hr tablet Take 25 mg by mouth daily.   Yes [provider]  PARoxetine (PAXIL) 20 MG tablet Take 20 mg by mouth daily.   Yes [provider]  potassium chloride SA (K-DUR,KLOR-CON) 20 MEQ tablet Take 20 mEq by mouth daily.   Yes [provider]  pravastatin (PRAVACHOL) 40 MG tablet Take 40 mg by mouth at bedtime.   Yes [provider]  predniSONE (DELTASONE) 5 MG tablet Take 5 mg by mouth daily with breakfast.   Yes [provider]  pregabalin (LYRICA) 50 MG capsule Take 1 capsule (50 mg total) by mouth 3 (three) times daily. 05/26/18  Yes Lassen, Arlo C, PA-C  Tafluprost (ZIOPTAN) 0.0015 % SOLN Place  1 drop into both eyes at bedtime.    Yes [provider]  temazepam (RESTORIL) 30 MG capsule Take 1 capsule (30 mg total) by mouth at bedtime. 05/14/18  Yes Lassen, Arlo C, PA-C  timolol (BETIMOL) 0.5 % ophthalmic solution Place 1 drop into both eyes 2 (two) times daily.    Yes [provider]  Tofacitinib Citrate (XELJANZ) 5 MG TABS Take 1 tablet by mouth daily.   Yes [provider]  vancomycin 1,000 mg in sodium chloride 0.9 % 250 mL Inject 1,000 mg into the vein every evening.   Yes [provider]  VITAMIN D, ERGOCALCIFEROL, PO Take 50,000 Units by mouth once a week. Thursdays   Yes [provider]  warfarin (COUMADIN) 1 MG tablet Take 1.5 mg by mouth daily. Tues., Thurs., Sat.   Yes [provider]  Magnesium Oxide 400 (240 Mg) MG TABS Take 1 tablet (400 mg total) by mouth daily. 06/08/18   Daleen Bo, MD  piperacillin-tazobactam (ZOSYN) IVPB Inject 3.375 g into the vein every 6 (six) hours. Indication:  Intraabdominal abscess Last Day of Therapy:  06/07/18 Labs - Once weekly:  CBC/D and BMP, Labs - Every other week:  ESR and CRP Patient not taking: Reported on 06/08/2018 05/13/18   Thurnell Lose, MD  warfarin (COUMADIN) 1 MG tablet Take 3 tablets (3 mg total)  by mouth daily at 6 PM. Take 3 mg daily x2 days only.  After that resume usual dosing of warfarin. 06/08/18   Daleen Bo, MD    Family History Family History  Problem Relation Age of Onset  . Hypertension Daughter   . Rheum arthritis Maternal Grandmother     Social History Social History   Tobacco Use  . Smoking status: Former Smoker    Packs/day: 0.25    Years: 5.00    Pack years: 1.25    Types: Cigarettes    Last attempt to quit: 06/11/1984    Years since quitting: 34.0  . Smokeless tobacco: Never Used  Substance Use Topics  . Alcohol use: Yes    Comment: occ  . Drug use: No    Types: Hydrocodone     Allergies   Lactose intolerance (gi); Butrans [buprenorphine]; and Simvastatin   Review of Systems Review of Systems  All other systems reviewed and are negative.    Physical Exam Updated Vital Signs BP 114/82 (BP Location: Left Leg)   Pulse (!) 110   Temp 97.6 F (36.4 C) (Oral)   Resp 12   Wt 103.4 kg   SpO2 94%   BMI 36.80 kg/m   Physical Exam Vitals signs and nursing note reviewed.  Constitutional:      Appearance: She is well-developed. She is obese.     Comments: Elderly  HENT:     Head: Normocephalic and atraumatic.     Comments: No abrasion, contusion or crepitation of the scalp or skull.    Right Ear: External ear normal.     Left Ear: External ear normal.  Eyes:     Conjunctiva/sclera: Conjunctivae normal.     Pupils: Pupils are equal, round, and reactive to light.  Neck:     Musculoskeletal: Normal range of motion and neck supple.     Trachea: Phonation normal.  Cardiovascular:     Rate and Rhythm: Normal rate and regular rhythm.     Heart sounds: Normal heart sounds.  Pulmonary:     Effort: Pulmonary effort is normal.     Breath  sounds: Normal breath sounds.  Abdominal:     Palpations: Abdomen is soft.     Tenderness: There is no abdominal tenderness.  Musculoskeletal: Normal range of motion.  Skin:    General: Skin is warm  and dry.  Neurological:     Mental Status: She is alert and oriented to person, place, and time.     Cranial Nerves: No cranial nerve deficit.     Sensory: No sensory deficit.     Motor: No abnormal muscle tone.     Coordination: Coordination normal.     Comments: No dysarthria or aphasia.  Psychiatric:        Mood and Affect: Mood normal.        Behavior: Behavior normal.        Thought Content: Thought content normal.        Judgment: Judgment normal.      ED Treatments / Results  Labs (all labs ordered are listed, but only abnormal results are displayed) Labs Reviewed  BASIC METABOLIC PANEL - Abnormal; Notable for the following components:      Result Value   Chloride 113 (*)    CO2 18 (*)    Glucose, Bld 138 (*)    Creatinine, Ser 1.06 (*)    Calcium 8.5 (*)    GFR calc non Af Amer 52 (*)    All other components within normal limits  CBC WITH DIFFERENTIAL/PLATELET - Abnormal; Notable for the following components:   RBC 3.36 (*)    Hemoglobin 10.3 (*)    HCT 34.9 (*)    MCV 103.9 (*)    MCHC 29.5 (*)    RDW 21.2 (*)    Lymphs Abs 0.5 (*)    All other components within normal limits  PROTIME-INR - Abnormal; Notable for the following components:   Prothrombin Time 21.6 (*)    All other components within normal limits  MAGNESIUM - Abnormal; Notable for the following components:   Magnesium 1.2 (*)    All other components within normal limits    EKG None  Radiology Ct Head Wo Contrast  Result Date: 06/08/2018 CLINICAL DATA:  Patient status post fall. No reported loss of consciousness. EXAM: CT HEAD WITHOUT CONTRAST CT CERVICAL SPINE WITHOUT CONTRAST TECHNIQUE: Multidetector CT imaging of the head and cervical spine was performed following the standard protocol without intravenous contrast. Multiplanar CT image reconstructions of the cervical spine were also generated. COMPARISON:  Cervical spine CT 06/27/2016 FINDINGS: CT HEAD FINDINGS Brain: Ventricles and sulci  are prominent compatible with atrophy. Periventricular and subcortical white matter hypodensity compatible with chronic microvascular ischemic changes. No evidence for acute cortically based infarct, intracranial hemorrhage, mass lesion or mass-effect. Basal ganglia calcifications. Vascular: Unremarkable Skull: Intact. Sinuses/Orbits: Paranasal sinuses are well aerated. Mastoid air cells are unremarkable. Orbits are unremarkable. Other: None. CT CERVICAL SPINE FINDINGS Alignment: Normal anatomic alignment. Skull base and vertebrae: No acute fracture. No primary bone lesion or focal pathologic process. Soft tissues and spinal canal: No prevertebral fluid or swelling. No visible canal hematoma. Disc levels: Multilevel degenerative disc disease most pronounced C5-6 and C6-7. no acute fracture. Upper chest: Unremarkable. Other: Bilateral carotid arterial vascular calcifications. IMPRESSION: No acute intracranial process. No acute cervical spine fracture. Electronically Signed   By: Lovey Newcomer M.D.   On: 06/08/2018 13:41   Ct Cervical Spine Wo Contrast  Result Date: 06/08/2018 CLINICAL DATA:  Patient status post fall. No reported loss of consciousness. EXAM: CT HEAD WITHOUT CONTRAST CT CERVICAL SPINE  WITHOUT CONTRAST TECHNIQUE: Multidetector CT imaging of the head and cervical spine was performed following the standard protocol without intravenous contrast. Multiplanar CT image reconstructions of the cervical spine were also generated. COMPARISON:  Cervical spine CT 06/27/2016 FINDINGS: CT HEAD FINDINGS Brain: Ventricles and sulci are prominent compatible with atrophy. Periventricular and subcortical white matter hypodensity compatible with chronic microvascular ischemic changes. No evidence for acute cortically based infarct, intracranial hemorrhage, mass lesion or mass-effect. Basal ganglia calcifications. Vascular: Unremarkable Skull: Intact. Sinuses/Orbits: Paranasal sinuses are well aerated. Mastoid air cells  are unremarkable. Orbits are unremarkable. Other: None. CT CERVICAL SPINE FINDINGS Alignment: Normal anatomic alignment. Skull base and vertebrae: No acute fracture. No primary bone lesion or focal pathologic process. Soft tissues and spinal canal: No prevertebral fluid or swelling. No visible canal hematoma. Disc levels: Multilevel degenerative disc disease most pronounced C5-6 and C6-7. no acute fracture. Upper chest: Unremarkable. Other: Bilateral carotid arterial vascular calcifications. IMPRESSION: No acute intracranial process. No acute cervical spine fracture. Electronically Signed   By: Lovey Newcomer M.D.   On: 06/08/2018 13:41    Procedures Procedures (including critical care time)  Medications Ordered in ED Medications - No data to display   Initial Impression / Assessment and Plan / ED Course  I have reviewed the triage vital signs and the nursing notes.  Pertinent labs & imaging results that were available during my care of the patient were reviewed by me and considered in my medical decision making (see chart for details).  Clinical Course as of Jun 08 1445  Sun Jun 08, 2018  1406 Normal except hemoglobin low, MCV high  CBC with Differential(!) [EW]  1406 Low  Magnesium(!) [EW]  1406 Normal except chloride high, CO2 low, glucose high, creatinine high, calcium low, GFR low  Basic metabolic panel(!) [EW]  7741 Subtherapeutic, due a dose later today.  Protime-INR(!) [EW]  1407 CO2(!): 18 [EW]    Clinical Course User Index [EW] Daleen Bo, MD     Patient Vitals for the past 24 hrs:  BP Temp Temp src Pulse Resp SpO2 Weight  06/08/18 1426 114/82 97.6 F (36.4 C) Oral (!) 110 12 94 % -  06/08/18 1425 - - - (!) 110 - - -  06/08/18 1230 (!) 128/108 - - (!) 112 14 95 % -  06/08/18 1200 (!) 138/92 - - (!) 106 18 95 % -  06/08/18 1130 124/76 - - (!) 119 13 95 % -  06/08/18 1112 (!) 141/85 98 F (36.7 C) Oral (!) 124 20 95 % -  06/08/18 1110 - - - - - - 103.4 kg    2:07  PM Reevaluation with update and discussion. After initial assessment and treatment, an updated evaluation reveals patient sleeping, arouses easily, has no further complaints.  Findings discussed with patient and her husband at the bedside.  He is due to go home from the rehab facility, tomorrow.  All questions answered. Daleen Bo   Medical Decision Making: Fall likely mechanical, related to ongoing weakness.  Screening evaluation is reassuring.  She has ongoing abnormalities being addressed at her current rehab facility.  Repeat magnesium level today is low, but potassium is normal.  She is on low-dose magnesium supplementation.  Coumadin level low, and dosing apparently needs to be bumped up a little bit.  Will increase Coumadin to 3 mg daily x2, then resume usual treatment.  Advise recheck INR in 3 to 5 days.  Note that the patient is on very low-dose and Coumadin,  at this time.    CRITICAL CARE-no Performed by: Daleen Bo  Nursing Notes Reviewed/ Care Coordinated Applicable Imaging Reviewed Interpretation of Laboratory Data incorporated into ED treatment  The patient appears reasonably screened and/or stabilized for discharge and I doubt any other medical condition or other [facility] requiring further screening, evaluation, or treatment in the ED at this time prior to discharge.  Plan: Home Medications-increase Coumadin for 2 days, increase magnesium for 30 days, resume other medicines as usual; Home Treatments-rest, fluids, take care when standing up; return here if the recommended treatment, does not improve the symptoms; Recommended follow up-PCP follow-up for repeat labs in 3 to 5 days.    Final Clinical Impressions(s) / ED Diagnoses   Final diagnoses:  Fall, initial encounter  Injury of head, initial encounter  Hypomagnesemia    ED Discharge Orders         Ordered    warfarin (COUMADIN) 1 MG tablet  Daily-1800     06/08/18 1444    Magnesium Oxide 400 (240 Mg) MG TABS  Daily      06/08/18 1444           Daleen Bo, MD 06/08/18 1446

## 2018-06-09 ENCOUNTER — Encounter (HOSPITAL_COMMUNITY)
Admission: RE | Admit: 2018-06-09 | Discharge: 2018-06-09 | Disposition: A | Discharge status: Home / Self Care | Payer: Medicare Other | Source: Skilled Nursing Facility | Attending: *Deleted | Admitting: *Deleted

## 2018-06-09 ENCOUNTER — Non-Acute Institutional Stay (SKILLED_NURSING_FACILITY): Payer: Medicare Other | Admitting: Internal Medicine

## 2018-06-09 ENCOUNTER — Encounter: Payer: Self-pay | Admitting: Internal Medicine

## 2018-06-09 DIAGNOSIS — W19XXXD Unspecified fall, subsequent encounter: Secondary | ICD-10-CM | POA: Diagnosis not present

## 2018-06-09 DIAGNOSIS — Z7901 Long term (current) use of anticoagulants: Secondary | ICD-10-CM | POA: Diagnosis not present

## 2018-06-09 DIAGNOSIS — Y92129 Unspecified place in nursing home as the place of occurrence of the external cause: Secondary | ICD-10-CM

## 2018-06-09 DIAGNOSIS — R790 Abnormal level of blood mineral: Secondary | ICD-10-CM | POA: Diagnosis not present

## 2018-06-09 LAB — PROTIME-INR
INR: 2.04
Prothrombin Time: 22.8 seconds — ABNORMAL HIGH (ref 11.4–15.2)

## 2018-06-09 LAB — MAGNESIUM: Magnesium: 1 mg/dL — ABNORMAL LOW (ref 1.7–2.4)

## 2018-06-09 LAB — BASIC METABOLIC PANEL
Anion gap: 10 (ref 5–15)
BUN: 18 mg/dL (ref 8–23)
CO2: 18 mmol/L — ABNORMAL LOW (ref 22–32)
Calcium: 8.7 mg/dL — ABNORMAL LOW (ref 8.9–10.3)
Chloride: 110 mmol/L (ref 98–111)
Creatinine, Ser: 1.38 mg/dL — ABNORMAL HIGH (ref 0.44–1.00)
GFR calc Af Amer: 44 mL/min — ABNORMAL LOW (ref >60)
GFR calc non Af Amer: 38 mL/min — ABNORMAL LOW (ref >60)
Glucose, Bld: 128 mg/dL — ABNORMAL HIGH (ref 70–99)
Potassium: 4.1 mmol/L (ref 3.5–5.1)
Sodium: 138 mmol/L (ref 135–145)

## 2018-06-09 NOTE — Progress Notes (Signed)
Location:    [facility] Room Number: 144/P Place of Service:  SNF [phone]) Provider:  Cory Roughen, MD  Patient Care Team: [name], MD as PCP - General (Internal Medicine)  Extended Emergency Contact Information Primary Emergency Contact: Rolland,Robert Address: [address]           [place], [place] [address] Johnnette Litter of [place] Phone: [phone] Mobile Phone: [phone] Relation: Spouse  Code Status:  Full Code Goals of care: Advanced Directive information Advanced Directives 06/09/2018  Does Patient Have a Medical Advance Directive? Yes  Type of Advance Directive (No Data)  Does patient want to make changes to medical advance directive? No - Patient declined  Would patient like information on creating a medical advance directive? No - Patient declined  Pre-existing out of facility DNR order (yellow form or pink MOST form) -     Chief Complaint  Patient presents with  . Acute Visit    F/U Fall  As well as follow-up of low magnesium  HPI:  Pt is a 72 y.o. female seen today for an acute visit for follow-up of fall in nursing facility as well as low magnesium.  Patient has a very complicated medical history including a lengthy hospitalization for perforated sigmoid diverticulitis with multiple abscesses.  She also has a history of hypertension as well as type 2 diabetes chronic kidney disease rheumatoid arthritis coronary artery disease with previous CABG as well as peripheral vascular lesion hyperlipidemia.  Regards to her abdominal issues she has received multiple antibiotics including Zosyn and vancomycin IV she is also supposed to be on Diflucan until January 1.  Apparently she fell here yesterday while trying to put her pants on she fell backwards and apparently hit her head- she was sent to the ER for evaluation- CT of the cervical spine and the head did not show any acute process.  Lab work did show her potassium has  dropped down to 1.2.  She has significant hypo-magnesium issues in fact had been down to 0.7 previously she did go to the ER and got IV magnesium which raised her level up to 1.6.  However on ER visit yesterday it appears it was back down to 1.2.  Currently she says she feels weak-but does not have any other acute complaints.  She is slated for discharge home later this week.  Of note she does continue on Coumadin INR in the ER continue to be slightly subtherapeutic and recommendation was for 3 mg of Coumadin for the next 2 days and then back to her previous level which appears to be more of 1  To -1-1/2 mg a day  She is on Coumadin with a history of a recent DVT right upper extremity she also has suspected atrial fibrillation       Past Medical History:  Diagnosis Date  . Anginal pain [facility])    sees Dr Einar Gip.   . Arthritis    rheumatoid ..   . Diabetes mellitus without complication (HCC)    Metformin 2.3.2017  . Diverticulitis   . Diverticulitis of large intestine with perforation and abscess 04/05/2018  . Dysrhythmia   . Fibromyalgia   . GERD (gastroesophageal reflux disease)   . High cholesterol   . Hypertension   . Liver disease 08- 2012   per Dr Dagmar Hait pt has enlarged liver  . Peripheral vascular disease (HCC)    legs  . Pneumonia 06/17/2016  . Sleep apnea  sleep study  oct 2012  . Stroke [facility]) 2011   [facility]     Past Surgical History:  Procedure Laterality Date  . [place]  . CHOLECYSTECTOMY  1996  . COLONOSCOPY N/A 02/13/2018   Procedure: COLONOSCOPY;  Surgeon: Rogene Houston, MD;  [facility];  Service: Endoscopy;  Laterality: N/A;  8:30  . CORONARY ARTERY BYPASS GRAFT  1986  . ESOPHAGEAL DILATION N/A 01/13/2018   Procedure: ESOPHAGEAL DILATION;  Surgeon: Rogene Houston, MD;  [facility];  Service: Endoscopy;  Laterality: N/A;  . ESOPHAGOGASTRODUODENOSCOPY N/A 01/13/2018   Procedure:  ESOPHAGOGASTRODUODENOSCOPY (EGD);  Surgeon: Rogene Houston, MD;  [facility];  Service: Endoscopy;  Laterality: N/A;  . EYE SURGERY  2013   cat ext bilateral .  . EYE SURGERY  2002   laser  . FOREIGN BODY REMOVAL  02/13/2018   Procedure: FOREIGN BODY REMOVAL;  Surgeon: Rogene Houston, MD;  [facility];  Service: Endoscopy;;  foreign body removal which appears to be food debri in a stem form removed from colon by DR. Rehman  . [place]- 2007   rod right leg - right wrist plate  . GAS/FLUID EXCHANGE  03/25/2012   Procedure: GAS/FLUID EXCHANGE;  Surgeon: Hayden Pedro, MD;  Location: [facility];  Service: Ophthalmology;  Laterality: Right;  . LAPAROTOMY N/A 04/30/2018   Procedure: EXPLORATORY LAPAROTOMY  LYSIS OF ADHESIONS SMALL BOWEL RESECTION WITH ANASTOMSIS LOOP ILEOSTOMY PLACEMENT OF DRAINS AND WOUND [place];  Surgeon: Donnie Mesa, MD;  Location: [place];  Service: General;  Laterality: N/A;  . PARS PLANA VITRECTOMY  03/25/2012   Procedure: PARS PLANA VITRECTOMY WITH 25 GAUGE;  Surgeon: Hayden Pedro, MD;  Location: [place];  Service: Ophthalmology;  Laterality: Right;    Allergies  Allergen Reactions  . Lactose Intolerance (Gi) Other (See Comments)    G.I. Upset  . Butrans [Buprenorphine] Rash and Other (See Comments)    Infected skin underneath application  . Simvastatin Rash    Outpatient Encounter Medications as of 06/09/2018  Medication Sig  . clopidogrel (PLAVIX) 75 MG tablet Take 1 tablet (75 mg total) by mouth daily with breakfast.  . Dexlansoprazole 30 MG capsule Take 30 mg by mouth daily.   . diclofenac sodium (VOLTAREN) 1 % GEL Apply 2 g topically 4 (four) times daily as needed (Pain).   Marland Kitchen diltiazem (CARDIZEM CD) 120 MG 24 hr capsule Take 1 capsule (120 mg total) by mouth daily.  . ferrous sulfate 325 (65 FE) MG tablet Take 1 tablet (325 mg total) by mouth 2 (two) times daily with a meal.  . fluconazole (DIFLUCAN) 200 MG tablet Take 2  tablets (400 mg total) by mouth daily.  Marland Kitchen HYDROcodone-acetaminophen (NORCO) 10-325 MG tablet Take 1 tablet by mouth 4 (four) times daily.  . insulin aspart (NOVOLOG FLEXPEN) 100 UNIT/ML FlexPen Give per sliding scale 2 times a day, 140-199 - 2 units, 200-250 - 4 units, 251-299 - 6 units,  300-349 - 8 units,  350 or above 10 units. Dispense syringes and needles as needed, Ok to switch to PEN if approved. Substitute to any brand approved. DX DM2, Code E11.65  . isosorbide dinitrate (ISORDIL) 5 MG tablet Take 1 tablet (5 mg total) by mouth 3 (three) times daily before meals.  Marland Kitchen leflunomide (ARAVA) 20 MG tablet Take 20 mg by mouth daily.  . Magnesium Oxide 400 (240 Mg) MG TABS  Take 1 tablet (400 mg total) by mouth daily.  . metoprolol succinate (TOPROL-XL) 25 MG 24 hr tablet Take 25 mg by mouth daily.  Marland Kitchen PARoxetine (PAXIL) 20 MG tablet Take 20 mg by mouth daily.  . potassium chloride SA (K-DUR,KLOR-CON) 20 MEQ tablet Take 20 mEq by mouth daily.  . pravastatin (PRAVACHOL) 40 MG tablet Take 40 mg by mouth at bedtime.  . predniSONE (DELTASONE) 5 MG tablet Take 5 mg by mouth daily with breakfast.  . pregabalin (LYRICA) 50 MG capsule Take 1 capsule (50 mg total) by mouth 3 (three) times daily.  . Tafluprost (ZIOPTAN) 0.0015 % SOLN Place 1 drop into both eyes at bedtime.   . temazepam (RESTORIL) 30 MG capsule Take 1 capsule (30 mg total) by mouth at bedtime.  . timolol (BETIMOL) 0.5 % ophthalmic solution Place 1 drop into both eyes 2 (two) times daily.   . Tofacitinib Citrate (XELJANZ) 5 MG TABS Take 1 tablet by mouth daily.  Marland Kitchen VITAMIN D, ERGOCALCIFEROL, PO Take 50,000 Units by mouth once a week. Thursdays  . warfarin (COUMADIN) 1 MG tablet Take 1 mg by mouth daily. Nancy Fetter., [place]., Wed., Fri. At 5:00pm  . warfarin (COUMADIN) 1 MG tablet Take 1.5 mg by mouth daily. On Tues., Thurs., Sat. At 5:00 pm  . warfarin (COUMADIN) 3 MG tablet Take 3 mg by mouth every evening.  . [DISCONTINUED] ALPRAZolam (XANAX) 0.25 MG  tablet Take 0.25 mg by mouth daily as needed for anxiety. Take for 14 days from 05/26/2018-06/08/2018  . [DISCONTINUED] piperacillin-tazobactam (ZOSYN) IVPB Inject 3.375 g into the vein every 6 (six) hours. Indication:  Intraabdominal abscess Last Day of Therapy:  06/07/18 Labs - Once weekly:  CBC/D and BMP, Labs - Every other week:  ESR and CRP (Patient not taking: Reported on 06/08/2018)  . [DISCONTINUED] vancomycin 1,000 mg in sodium chloride 0.9 % 250 mL Inject 1,000 mg into the vein every evening.  . [DISCONTINUED] warfarin (COUMADIN) 1 MG tablet Take 3 tablets (3 mg total) by mouth daily at 6 PM. Take 3 mg daily x2 days only.  After that resume usual dosing of warfarin. (Patient taking differently: Take 1 mg by mouth daily. Tues.Thurs., Sat.)   No facility-administered encounter medications on file as of 06/09/2018.     Review of Systems   General she not complaining of fever chills says she does feel weak.  Skin is not complaining of rashes.  Head ears eyes nose mouth and throat is not complain of visual changes or sore throat.  Respiratory does not complain of being short of breath or having a cough.  Cardiac does not complain of chest pain she has mild lower extremity edema.  GI has significant issues as noted above but is not really complaining of abdominal pain she does have drains as well as a wound VAC and ileostomy bag  GU is not complaining of dysuria.  Musculoskeletal has weakness especially lower extremities is not really complaining of pain.  Neurologic positive for weakness does not complain of headache or dizziness or syncope.  And psych does appear to be somewhat down-she has been started on Paxil    Immunization History  Administered Date(s) Administered  . Influenza Split 03/26/2012, 03/11/2013  . Influenza, High Dose Seasonal PF 04/07/2018   Pertinent  Health Maintenance Due  Topic Date Due  . FOOT EXAM  06/14/2018 (Originally 08/14/1955)  . MAMMOGRAM   06/14/2018 (Originally 11/17/2016)  . OPHTHALMOLOGY EXAM  06/14/2018 (Originally 08/14/1955)  . URINE MICROALBUMIN  06/14/2018 (Originally 08/14/1955)  . DEXA SCAN  06/14/2018 (Originally 08/13/2009)  . PNA vac Low Risk Adult (1 of 2 - PCV13) 06/14/2018 (Originally 08/13/2009)  . HEMOGLOBIN A1C  10/07/2018  . COLONOSCOPY  02/14/2028  . INFLUENZA VACCINE  Completed   No flowsheet data found. Functional Status Survey:    Vitals:   06/09/18 1547  BP: (!) 151/52  Pulse: (!) 124  Resp: 20  Temp: 98.4 F (36.9 C)  TempSrc: Oral  SpO2: 96%    Physical Exam In general this is a pleasant elderly female in no distress.  Skin is warm and dry she does have drains placed in the abdominal area as well as an ileostomy bag covering of her wound.  Visual acuity appears to be intact sclera and conjunctive are clear.  Chest is clear to auscultation there is no labored breathing there are reduced breath sounds somewhat.  Heart is regular irregular rate and rhythm without murmur gallop or rub she has mild lower extremity edema.  Her abdomen is obese soft appear to be tender again she does have drains and covering of her wound as well as an ileostomy bag.  Musculoskeletal continues to have lower extremity weakness is able to move all extremities x4- is able to transfer.  Neurologic is grossly intact her speech is clear she does have lower extremity weakness.  Psych she is alert and oriented pleasant and appropriate.   Labs reviewed: Recent Labs    05/06/18 0406 05/07/18 0348  05/09/18 0429  06/06/18 0708 06/06/18 1446 06/08/18 1220 06/09/18 0715  NA 143 141   < > 136   < > 141  --  140 138  K 4.0 3.9   < > 3.3*   < > 3.4*  --  4.3 4.1  CL 102 101   < > 101   < > 114*  --  113* 110  CO2 31 30   < > 28   < > 18*  --  18* 18*  GLUCOSE 113* 121*   < > 90   < > 103*  --  138* 128*  BUN 50* 48*   < > 32*   < > 17  --  15 18  CREATININE 1.14* 1.12*   < > 1.00   < > 0.85  --  1.06* 1.38*    CALCIUM 8.1* 7.9*   < > 7.8*   < > 8.0*  --  8.5* 8.7*  MG 1.8 1.5*   < > 1.8   < > 0.7* 1.6* 1.2* 1.0*  PHOS 5.7* 4.7*  --  3.6  --   --   --   --   --    < > = values in this interval not displayed.   Recent Labs    05/05/18 0351 05/07/18 0348 05/13/18 0412  AST 64* 44* 22  ALT 47* 42 16  ALKPHOS 93 101 81  BILITOT 0.4 0.6 0.6  PROT 4.9* 5.0* 4.9*  ALBUMIN 1.7* 1.8* 1.6*   Recent Labs    06/04/18 0942 06/06/18 1207 06/08/18 1220  WBC 7.3 9.0 7.1  NEUTROABS 5.5 7.2 5.3  HGB 9.7* 10.0* 10.3*  HCT 32.7* 33.6* 34.9*  MCV 104.1* 104.3* 103.9*  PLT 348 352 377   Lab Results  Component Value Date   TSH 7.944 (H) 05/07/2018   Lab Results  Component Value Date   HGBA1C 5.9 (H) 04/07/2018   Lab Results  Component Value Date   CHOL 145 06/21/2013  HDL 45 06/21/2013   LDLCALC 65 06/21/2013   TRIG 185 (H) 05/05/2018   CHOLHDL 3.2 06/21/2013    Significant Diagnostic Results in last 30 days:  Ct Head Wo Contrast  Result Date: 06/08/2018 CLINICAL DATA:  Patient status post fall. No reported loss of consciousness. EXAM: CT HEAD WITHOUT CONTRAST CT CERVICAL SPINE WITHOUT CONTRAST TECHNIQUE: Multidetector CT imaging of the head and cervical spine was performed following the standard protocol without intravenous contrast. Multiplanar CT image reconstructions of the cervical spine were also generated. COMPARISON:  Cervical spine CT 06/27/2016 FINDINGS: CT HEAD FINDINGS Brain: Ventricles and sulci are prominent compatible with atrophy. Periventricular and subcortical white matter hypodensity compatible with chronic microvascular ischemic changes. No evidence for acute cortically based infarct, intracranial hemorrhage, mass lesion or mass-effect. Basal ganglia calcifications. Vascular: Unremarkable Skull: Intact. Sinuses/Orbits: Paranasal sinuses are well aerated. Mastoid air cells are unremarkable. Orbits are unremarkable. Other: None. CT CERVICAL SPINE FINDINGS Alignment: Normal  anatomic alignment. Skull base and vertebrae: No acute fracture. No primary bone lesion or focal pathologic process. Soft tissues and spinal canal: No prevertebral fluid or swelling. No visible canal hematoma. Disc levels: Multilevel degenerative disc disease most pronounced C5-6 and C6-7. no acute fracture. Upper chest: Unremarkable. Other: Bilateral carotid arterial vascular calcifications. IMPRESSION: No acute intracranial process. No acute cervical spine fracture. Electronically Signed   By: Lovey Newcomer M.D.   On: 06/08/2018 13:41   Ct Cervical Spine Wo Contrast  Result Date: 06/08/2018 CLINICAL DATA:  Patient status post fall. No reported loss of consciousness. EXAM: CT HEAD WITHOUT CONTRAST CT CERVICAL SPINE WITHOUT CONTRAST TECHNIQUE: Multidetector CT imaging of the head and cervical spine was performed following the standard protocol without intravenous contrast. Multiplanar CT image reconstructions of the cervical spine were also generated. COMPARISON:  Cervical spine CT 06/27/2016 FINDINGS: CT HEAD FINDINGS Brain: Ventricles and sulci are prominent compatible with atrophy. Periventricular and subcortical white matter hypodensity compatible with chronic microvascular ischemic changes. No evidence for acute cortically based infarct, intracranial hemorrhage, mass lesion or mass-effect. Basal ganglia calcifications. Vascular: Unremarkable Skull: Intact. Sinuses/Orbits: Paranasal sinuses are well aerated. Mastoid air cells are unremarkable. Orbits are unremarkable. Other: None. CT CERVICAL SPINE FINDINGS Alignment: Normal anatomic alignment. Skull base and vertebrae: No acute fracture. No primary bone lesion or focal pathologic process. Soft tissues and spinal canal: No prevertebral fluid or swelling. No visible canal hematoma. Disc levels: Multilevel degenerative disc disease most pronounced C5-6 and C6-7. no acute fracture. Upper chest: Unremarkable. Other: Bilateral carotid arterial vascular  calcifications. IMPRESSION: No acute intracranial process. No acute cervical spine fracture. Electronically Signed   By: Lovey Newcomer M.D.   On: 06/08/2018 13:41   Ir Picc Replacement Leftinc Imgguide  Result Date: 05/19/2018 INDICATION: Malfunctioning PICC line. Diverticular abscess. Need to continue IV antibiotics. EXAM: FLUOROSCOPIC GUIDED PICC LINE EXCHANGE TECHNIQUE: The procedure, risks, benefits, and alternatives were explained to the patient and informed written consent was obtained. The left upper extremity and external portion of the existing PICC line was prepped with chlorhexidine in a sterile fashion, and a sterile drape was applied covering the operative field. Maximum barrier sterile technique with sterile gowns and gloves were used for the procedure. A timeout was performed prior to the initiation of the procedure. Local anesthesia was provided with 1% lidocaine. The existing PICC line was cannulated with an 0.0018 wire which was advanced through the catheter. The catheter was exchanged for a peel-away sheath, ultimately allowing advancement of a 46-cm, 5- [place], double lumen PICC  line to the level of the superior caval atrial junction. A post procedure spot fluoroscopic image was obtained. The catheter easily aspirated and flushed and was secured in place. A dressing was placed. The patient tolerated the procedure well without immediate post procedural complication. FINDINGS: After catheter exchange, the tip lies within the superior cavoatrial junction. The catheter aspirates and flushes normally, it was capped, and is ready for immediate use. IMPRESSION: Successful fluoroscopic guided exchange of left upper extremity approach 46 cm, 5 - [place], double lumen PICC with tip overlying the superior caval atrial junction. The PICC line is ready for immediate use. Read by: Gareth Eagle, PA-C Electronically Signed   By: Markus Daft M.D.   On: 05/19/2018 13:50    Assessment/Plan  #1 fall with no  apparent injuries- at this point continue to monitor CT scan of head and cervical spine was reassuring- she will need extensive PT and OT when she goes home.  2.-  History of low magnesium-magnesium continues to drop despite being on supplementation-I did speak with pharmacist and we will switch her to slow magnesium 3 times daily hopefully this will help with absorption will monitor her magnesium levels. I do note she is on a proton pump inhibitor as well we will discontinue dexlansoprazole since this can interfere with magnesium absorption  3.-  Coumadin management recommendation by ER is for 3 mg for 2 days and then resume usual dose- INR today is borderline therapeutic at 2.04-she is slated to get another 3 mg tonight--after tonight resume 1-1/2 mg a day- will await updated INR tomorrow   [phone]

## 2018-06-10 ENCOUNTER — Emergency Department (HOSPITAL_COMMUNITY): Payer: Medicare Other

## 2018-06-10 ENCOUNTER — Other Ambulatory Visit: Payer: Self-pay

## 2018-06-10 ENCOUNTER — Emergency Department (HOSPITAL_COMMUNITY)
Admission: EM | Admit: 2018-06-10 | Discharge: 2018-06-10 | Disposition: A | Discharge status: Skilled Nursing Facility | Payer: Medicare Other | Attending: Emergency Medicine | Admitting: Emergency Medicine

## 2018-06-10 ENCOUNTER — Encounter (HOSPITAL_COMMUNITY)
Admission: RE | Admit: 2018-06-10 | Discharge: 2018-06-10 | Disposition: A | Discharge status: Home / Self Care | Payer: Medicare Other | Source: Skilled Nursing Facility | Attending: Internal Medicine | Admitting: Internal Medicine

## 2018-06-10 ENCOUNTER — Inpatient Hospital Stay
Admission: RE | Admit: 2018-06-10 | Discharge: 2018-06-13 | Disposition: A | Discharge status: Home / Self Care | Payer: Medicare Other | Source: Ambulatory Visit | Attending: Internal Medicine | Admitting: Internal Medicine

## 2018-06-10 ENCOUNTER — Encounter (HOSPITAL_COMMUNITY): Payer: Self-pay

## 2018-06-10 DIAGNOSIS — Z95828 Presence of other vascular implants and grafts: Secondary | ICD-10-CM

## 2018-06-10 DIAGNOSIS — Z87891 Personal history of nicotine dependence: Secondary | ICD-10-CM | POA: Insufficient documentation

## 2018-06-10 DIAGNOSIS — E860 Dehydration: Secondary | ICD-10-CM

## 2018-06-10 DIAGNOSIS — K651 Peritoneal abscess: Principal | ICD-10-CM

## 2018-06-10 DIAGNOSIS — Z7901 Long term (current) use of anticoagulants: Secondary | ICD-10-CM | POA: Diagnosis not present

## 2018-06-10 DIAGNOSIS — I10 Essential (primary) hypertension: Secondary | ICD-10-CM | POA: Diagnosis not present

## 2018-06-10 DIAGNOSIS — I129 Hypertensive chronic kidney disease with stage 1 through stage 4 chronic kidney disease, or unspecified chronic kidney disease: Secondary | ICD-10-CM | POA: Insufficient documentation

## 2018-06-10 DIAGNOSIS — Z79899 Other long term (current) drug therapy: Secondary | ICD-10-CM | POA: Insufficient documentation

## 2018-06-10 DIAGNOSIS — E1122 Type 2 diabetes mellitus with diabetic chronic kidney disease: Secondary | ICD-10-CM | POA: Diagnosis not present

## 2018-06-10 DIAGNOSIS — N183 Chronic kidney disease, stage 3 (moderate): Secondary | ICD-10-CM | POA: Diagnosis not present

## 2018-06-10 DIAGNOSIS — Z48815 Encounter for surgical aftercare following surgery on the digestive system: Secondary | ICD-10-CM | POA: Insufficient documentation

## 2018-06-10 DIAGNOSIS — M545 Low back pain: Secondary | ICD-10-CM | POA: Diagnosis not present

## 2018-06-10 DIAGNOSIS — Z452 Encounter for adjustment and management of vascular access device: Secondary | ICD-10-CM | POA: Diagnosis not present

## 2018-06-10 DIAGNOSIS — Z8673 Personal history of transient ischemic attack (TIA), and cerebral infarction without residual deficits: Secondary | ICD-10-CM | POA: Insufficient documentation

## 2018-06-10 DIAGNOSIS — Z794 Long term (current) use of insulin: Secondary | ICD-10-CM | POA: Insufficient documentation

## 2018-06-10 DIAGNOSIS — A419 Sepsis, unspecified organism: Secondary | ICD-10-CM | POA: Insufficient documentation

## 2018-06-10 LAB — BASIC METABOLIC PANEL
Anion gap: 8 (ref 5–15)
Anion gap: 11 (ref 5–15)
BUN: 29 mg/dL — ABNORMAL HIGH (ref 8–23)
BUN: 32 mg/dL — ABNORMAL HIGH (ref 8–23)
CO2: 19 mmol/L — ABNORMAL LOW (ref 22–32)
CO2: 15 mmol/L — ABNORMAL LOW (ref 22–32)
Calcium: 8.7 mg/dL — ABNORMAL LOW (ref 8.9–10.3)
Calcium: 8.7 mg/dL — ABNORMAL LOW (ref 8.9–10.3)
Chloride: 111 mmol/L (ref 98–111)
Chloride: 111 mmol/L (ref 98–111)
Creatinine, Ser: 1.6 mg/dL — ABNORMAL HIGH (ref 0.44–1.00)
Creatinine, Ser: 1.75 mg/dL — ABNORMAL HIGH (ref 0.44–1.00)
GFR calc Af Amer: 33 mL/min — ABNORMAL LOW (ref >60)
GFR calc non Af Amer: 32 mL/min — ABNORMAL LOW (ref >60)
GFR, EST AFRICAN AMERICAN: 37 mL/min — AB (ref >60)
GFR, EST NON AFRICAN AMERICAN: 28 mL/min — AB (ref >60)
Glucose, Bld: 104 mg/dL — ABNORMAL HIGH (ref 70–99)
Glucose, Bld: 144 mg/dL — ABNORMAL HIGH (ref 70–99)
Potassium: 4.5 mmol/L (ref 3.5–5.1)
Potassium: 5 mmol/L (ref 3.5–5.1)
Sodium: 138 mmol/L (ref 135–145)
Sodium: 137 mmol/L (ref 135–145)

## 2018-06-10 LAB — PROTIME-INR
INR: 2.59
INR: 2.3
PROTHROMBIN TIME: 27.4 s — AB (ref 11.4–15.2)
Prothrombin Time: 25 seconds — ABNORMAL HIGH (ref 11.4–15.2)

## 2018-06-10 LAB — CBC
HCT: 35.7 % — ABNORMAL LOW (ref 36.0–46.0)
Hemoglobin: 10.3 g/dL — ABNORMAL LOW (ref 12.0–15.0)
MCH: 30.6 pg (ref 26.0–34.0)
MCHC: 28.9 g/dL — ABNORMAL LOW (ref 30.0–36.0)
MCV: 105.9 fL — ABNORMAL HIGH (ref 80.0–100.0)
PLATELETS: 379 10*3/uL (ref 150–400)
RBC: 3.37 MIL/uL — ABNORMAL LOW (ref 3.87–5.11)
RDW: 20.5 % — ABNORMAL HIGH (ref 11.5–15.5)
WBC: 7.6 10*3/uL (ref 4.0–10.5)
nRBC: 0 % (ref 0.0–0.2)

## 2018-06-10 LAB — MAGNESIUM
Magnesium: 1.2 mg/dL — ABNORMAL LOW (ref 1.7–2.4)
Magnesium: 1.4 mg/dL — ABNORMAL LOW (ref 1.7–2.4)

## 2018-06-10 MED ORDER — MAGNESIUM SULFATE 2 GM/50ML IV SOLN
2.0000 g | Freq: Once | INTRAVENOUS | Status: AC
  Administered 2018-06-10: 2 g via INTRAVENOUS
  Filled 2018-06-10: qty 50

## 2018-06-10 MED ORDER — SODIUM CHLORIDE 0.9 % IV BOLUS
1000.0000 mL | Freq: Once | INTRAVENOUS | Status: AC
  Administered 2018-06-10: 1000 mL via INTRAVENOUS

## 2018-06-10 MED ORDER — ALTEPLASE 2 MG IJ SOLR
2.0000 mg | Freq: Once | INTRAMUSCULAR | Status: AC
  Administered 2018-06-10: 2 mg
  Filled 2018-06-10: qty 2

## 2018-06-10 NOTE — ED Notes (Signed)
Unable to draw blood or flush 1 port on PICC access, other port flushes well but does not draw.  MD made aware as pt does not wish for attempt at peripheral access.

## 2018-06-10 NOTE — Discharge Instructions (Addendum)
You were sent in from your facility for a low magnesium that was not responding to oral magnesium.  We gave you an IV dose of 2 g of magnesium here.  There was also some concern of a train that got pulled out of your abdominal cavity.  It was not draining much to begin with and you have no abdominal pain or fever or white count so we do not feel this will need to be replaced.  It will be important for you to keep well-hydrated because your creatinine was slightly elevated.  It will also be important for you to follow-up with your surgeons as scheduled.  Please contact them after the holidays to let them know that the drain was out.  Return if any concerns.

## 2018-06-10 NOTE — ED Notes (Signed)
Per [facility] RN, "top" JP drain came out on 12/30. Dressing applied. [facility] staff also reports pt to be evaluated for abnormal BUN and Magnesium Levels. If admission indicated pt is to be transferred to [facility] per Dr. Kellie Moor office.

## 2018-06-10 NOTE — ED Notes (Signed)
Vascular wellness called to come look at power picc, per vascular someone will be here to look at it at 1730-1800.

## 2018-06-10 NOTE — ED Notes (Addendum)
Unable to obtain blood from picc line. Pt states "red port" clogged. Attempted to flush other port. Port able to be flushed but no blood return noted. EDP aware as well as [facility] contacted to assist with declotting PICC.   Verbal order from EDP to stick below PICC for blood work. Bilateral upper extremities restricted. Lab at bedside

## 2018-06-10 NOTE — ED Triage Notes (Signed)
Pt was here sent due to low magnesium levels. Per paperwork, states it is at her baseline. JP drain came out 3 ago and also sent her here for a recheck of that. Has an ileostomy, Chronic a fib

## 2018-06-10 NOTE — ED Provider Notes (Signed)
[facility] EMERGENCY DEPARTMENT Provider Note   CSN: [id number] Arrival date & time: 06/10/18  1117     History   Chief Complaint No chief complaint on file.   HPI [name] is a 72 y.o. female.  She is presenting here from rehab for evaluation of low magnesium.  When I asked the patient herself while she is here she said she does not know.  She said yesterday she was lethargic but today she feels better.  She said they had drawn some blood work and maybe it had submitted over that.  She is a complicated recent medical history with perforated sigmoid diverticulitis that is requiring surgery and multiple drains still in place.  She has a wound VAC in place.  She said she is doing a little bit of walking.  She thought she was go to be discharged today but it sounds like it is going to be Thursday now.  She denies any fevers or chills no chest pain no shortness of breath no abdominal pain no urinary symptoms.  The history is provided by the patient.  Illness  Pertinent negatives include no chest pain, no abdominal pain, no headaches and no shortness of breath. Nothing aggravates the symptoms. Nothing relieves the symptoms. She has tried nothing for the symptoms. The treatment provided no relief.    Past Medical History:  Diagnosis Date  . Anginal pain [facility])    sees Dr Einar Gip.   . Arthritis    rheumatoid ..   . Diabetes mellitus without complication (HCC)    Metformin 2.3.2017  . Diverticulitis   . Diverticulitis of large intestine with perforation and abscess 04/05/2018  . Dysrhythmia   . Fibromyalgia   . GERD (gastroesophageal reflux disease)   . High cholesterol   . Hypertension   . Liver disease 08- 2012   per Dr Dagmar Hait pt has enlarged liver  . Peripheral vascular disease (HCC)    legs  . Pneumonia 06/17/2016  . Sleep apnea    sleep study  oct 2012  . Stroke [facility]) 2011   [facility]      Patient Active Problem List   Diagnosis Date Noted  . Abscess of  sigmoid colon due to diverticulitis   . Acute renal failure superimposed on stage 3 chronic kidney disease ([place]) 04/08/2018  . CKD (chronic kidney disease) stage 3, GFR 30-59 ml/min (HCC) 04/06/2018  . Fibromyalgia 04/06/2018  . Diverticulitis of large intestine with perforation and abscess 04/05/2018  . Anemia 04/05/2018  . Esophageal dysphagia 01/03/2018  . Rheumatoid arthritis ([place]) 06/27/2016  . Carpal tunnel syndrome 03/24/2014  . Paresthesias/numbness 03/24/2014  . Obesity, unspecified 09/23/2013  . Pulmonary nodules 07/03/2013  . TIA (transient ischemic attack) 06/20/2013  . Left arm weakness 06/20/2013  . DM (diabetes mellitus) ([place]) 06/20/2013  . HTN (hypertension) 06/20/2013  . Chronic pain 06/20/2013  . Vitreous hemorrhage ([place]) 03/25/2012    Past Surgical History:  Procedure Laterality Date  . [place]  . CHOLECYSTECTOMY  1996  . COLONOSCOPY N/A 02/13/2018   Procedure: COLONOSCOPY;  Surgeon: Rogene Houston, MD;  Location[facility];  Service: Endoscopy;  Laterality: N/A;  8:30  . CORONARY ARTERY BYPASS GRAFT  1986  . ESOPHAGEAL DILATION N/A 01/13/2018   Procedure: ESOPHAGEAL DILATION;  Surgeon: Rogene Houston, MD;  Location: [facility];  Service: Endoscopy;  Laterality: N/A;  . ESOPHAGOGASTRODUODENOSCOPY N/A 01/13/2018   Procedure: ESOPHAGOGASTRODUODENOSCOPY (EGD);  Surgeon: Rogene Houston, MD;  Location[facility];  Service: Endoscopy;  Laterality: N/A;  . EYE SURGERY  2013   cat ext bilateral .  . EYE SURGERY  2002   laser  . FOREIGN BODY REMOVAL  02/13/2018   Procedure: FOREIGN BODY REMOVAL;  Surgeon: Rogene Houston, MD;  Location: [facility];  Service: Endoscopy;;  foreign body removal which appears to be food debri in a stem form removed from colon by DR. Rehman  . [place]- 2007   rod right leg - right wrist plate  . GAS/FLUID EXCHANGE  03/25/2012   Procedure: GAS/FLUID EXCHANGE;  Surgeon: Hayden Pedro, MD;  Location: [place];  Service: Ophthalmology;  Laterality: Right;  . LAPAROTOMY N/A 04/30/2018   Procedure: EXPLORATORY LAPAROTOMY  LYSIS OF ADHESIONS SMALL BOWEL RESECTION WITH ANASTOMSIS LOOP ILEOSTOMY PLACEMENT OF DRAINS AND WOUND [place];  Surgeon: Donnie Mesa, MD;  Location: [place];  Service: General;  Laterality: N/A;  . PARS PLANA VITRECTOMY  03/25/2012   Procedure: PARS PLANA VITRECTOMY WITH 25 GAUGE;  Surgeon: Hayden Pedro, MD;  Location: [place];  Service: Ophthalmology;  Laterality: Right;     OB History   No obstetric history on file.      Home Medications    Prior to Admission medications   Medication Sig Start Date End Date Taking? Authorizing Provider  clopidogrel (PLAVIX) 75 MG tablet Take 1 tablet (75 mg total) by mouth daily with breakfast. 01/14/18   Rehman, Mechele Dawley, MD  Dexlansoprazole 30 MG capsule Take 30 mg by mouth daily.     [provider]  diclofenac sodium (VOLTAREN) 1 % GEL Apply 2 g topically 4 (four) times daily as needed (Pain).     [provider]  diltiazem (CARDIZEM CD) 120 MG 24 hr capsule Take 1 capsule (120 mg total) by mouth daily. 05/13/18 05/13/19  Thurnell Lose, MD  ferrous sulfate 325 (65 FE) MG tablet Take 1 tablet (325 mg total) by mouth 2 (two) times daily with a meal. 05/13/18   Thurnell Lose, MD  fluconazole (DIFLUCAN) 200 MG tablet Take 2 tablets (400 mg total) by mouth daily. 05/14/18   Thurnell Lose, MD  HYDROcodone-acetaminophen (NORCO) 10-325 MG tablet Take 1 tablet by mouth 4 (four) times daily. 05/28/18   Granville Lewis C, PA-C  insulin aspart (NOVOLOG FLEXPEN) 100 UNIT/ML FlexPen Give per sliding scale 2 times a day, 140-199 - 2 units, 200-250 - 4 units, 251-299 - 6 units,  300-349 - 8 units,  350 or above 10 units. Dispense syringes and needles as needed, Ok to switch to PEN if approved. Substitute to any brand approved. DX DM2, Code E11.65    [provider]  isosorbide dinitrate (ISORDIL) 5  MG tablet Take 1 tablet (5 mg total) by mouth 3 (three) times daily before meals. 02/07/18   Rehman, Mechele Dawley, MD  leflunomide (ARAVA) 20 MG tablet Take 20 mg by mouth daily.    [provider]  Magnesium Oxide 400 (240 Mg) MG TABS Take 1 tablet (400 mg total) by mouth daily. 06/08/18   Daleen Bo, MD  metoprolol succinate (TOPROL-XL) 25 MG 24 hr tablet Take 25 mg by mouth daily.    [provider]  PARoxetine (PAXIL) 20 MG tablet Take 20 mg by mouth daily.    [provider]  potassium chloride SA (K-DUR,KLOR-CON) 20 MEQ tablet Take 20 mEq by mouth daily.    [provider]  pravastatin (PRAVACHOL) 40  MG tablet Take 40 mg by mouth at bedtime.    [provider]  predniSONE (DELTASONE) 5 MG tablet Take 5 mg by mouth daily with breakfast.    [provider]  pregabalin (LYRICA) 50 MG capsule Take 1 capsule (50 mg total) by mouth 3 (three) times daily. 05/26/18   Oscar La, Arlo C, PA-C  Tafluprost (ZIOPTAN) 0.0015 % SOLN Place 1 drop into both eyes at bedtime.     [provider]  temazepam (RESTORIL) 30 MG capsule Take 1 capsule (30 mg total) by mouth at bedtime. 05/14/18   Granville Lewis C, PA-C  timolol (BETIMOL) 0.5 % ophthalmic solution Place 1 drop into both eyes 2 (two) times daily.     [provider]  Tofacitinib Citrate (XELJANZ) 5 MG TABS Take 1 tablet by mouth daily.    [provider]  VITAMIN D, ERGOCALCIFEROL, PO Take 50,000 Units by mouth once a week. Thursdays    [provider]  warfarin (COUMADIN) 1 MG tablet Take 1 mg by mouth daily. Nancy Fetter., [place]., Wed., Fri. At 5:00pm    [provider]  warfarin (COUMADIN) 1 MG tablet Take 1.5 mg by mouth daily. On Tues., Thurs., Sat. At 5:00 pm    [provider]  warfarin (COUMADIN) 3 MG tablet Take 3 mg by mouth every evening.    [provider]    Family History Family History  Problem Relation Age of Onset  . Hypertension  Daughter   . Rheum arthritis Maternal Grandmother     Social History Social History   Tobacco Use  . Smoking status: Former Smoker    Packs/day: 0.25    Years: 5.00    Pack years: 1.25    Types: Cigarettes    Last attempt to quit: 06/11/1984    Years since quitting: 34.0  . Smokeless tobacco: Never Used  Substance Use Topics  . Alcohol use: Yes    Comment: occ  . Drug use: No    Types: Hydrocodone     Allergies   Lactose intolerance (gi); [place] [buprenorphine]; and Simvastatin   Review of Systems Review of Systems  Constitutional: Negative for fever.  HENT: Negative for sore throat.   Eyes: Negative for visual disturbance.  Respiratory: Negative for shortness of breath.   Cardiovascular: Negative for chest pain.  Gastrointestinal: Negative for abdominal pain, nausea and vomiting.  Genitourinary: Negative for dysuria.  Musculoskeletal: Positive for back pain (chronic). Negative for neck pain.  Skin: Negative for rash.  Neurological: Negative for seizures and headaches.     Physical Exam Updated Vital Signs BP (!) 174/98   Pulse 86   Temp (!) 97.5 F (36.4 C) (Oral)   Resp 20   SpO2 97%   Physical Exam Vitals signs and nursing note reviewed.  Constitutional:      General: She is not in acute distress.    Appearance: She is well-developed.  HENT:     Head: Normocephalic and atraumatic.  Eyes:     Conjunctiva/sclera: Conjunctivae normal.  Neck:     Musculoskeletal: Neck supple.  Cardiovascular:     Rate and Rhythm: Normal rate. Rhythm irregular.     Heart sounds: No murmur.  Pulmonary:     Effort: Pulmonary effort is normal. No respiratory distress.     Breath sounds: Normal breath sounds.  Abdominal:     Palpations: Abdomen is soft.     Tenderness: There is no abdominal tenderness.     Comments: She has a healing  midline surgical incision with a wound VAC in place.  She has a drain in her left mid abdomen hooked up to a JP with some white drainage  in it.  She has another apparent IR drain in the right side of her abdomen that is got some thin brown discharge.  She is got an ileostomy with some brown liquid in it.  She says none of the outputs have been increasing.  Musculoskeletal:        General: Tenderness (her fibromyalgia) present. No deformity or signs of injury.  Skin:    General: Skin is warm and dry.  Neurological:     General: No focal deficit present.     Mental Status: She is alert and oriented to person, place, and time.     Motor: No weakness.  Psychiatric:        Mood and Affect: Mood normal.      ED Treatments / Results  Labs (all labs ordered are listed, but only abnormal results are displayed) Labs Reviewed  CBC - Abnormal; Notable for the following components:      Result Value   RBC 3.37 (*)    Hemoglobin 10.3 (*)    HCT 35.7 (*)    MCV 105.9 (*)    MCHC 28.9 (*)    RDW 20.5 (*)    All other components within normal limits  BASIC METABOLIC PANEL - Abnormal; Notable for the following components:   CO2 15 (*)    Glucose, Bld 144 (*)    BUN 32 (*)    Creatinine, Ser 1.75 (*)    Calcium 8.7 (*)    GFR calc non Af Amer 28 (*)    GFR calc Af Amer 33 (*)    All other components within normal limits  MAGNESIUM - Abnormal; Notable for the following components:   Magnesium 1.4 (*)    All other components within normal limits  PROTIME-INR - Abnormal; Notable for the following components:   Prothrombin Time 25.0 (*)    All other components within normal limits    EKG EKG Interpretation  Date/Time:  Tuesday June 10 2018 12:55:40 EST Ventricular Rate:  106 PR Interval:    QRS Duration: 93 QT Interval:  341 QTC Calculation: 453 R Axis:   131 Text Interpretation:  Atrial fibrillation Low voltage, extremity and precordial leads Probable right ventricular hypertrophy Baseline wander in lead(s) V3 similar to prior 12/19 Confirmed by Aletta Edouard [phone]) on 06/10/2018 1:01:28 PM   Radiology Ct  Head Wo Contrast  Result Date: 06/08/2018 CLINICAL DATA:  Patient status post fall. No reported loss of consciousness. EXAM: CT HEAD WITHOUT CONTRAST CT CERVICAL SPINE WITHOUT CONTRAST TECHNIQUE: Multidetector CT imaging of the head and cervical spine was performed following the standard protocol without intravenous contrast. Multiplanar CT image reconstructions of the cervical spine were also generated. COMPARISON:  Cervical spine CT 06/27/2016 FINDINGS: CT HEAD FINDINGS Brain: Ventricles and sulci are prominent compatible with atrophy. Periventricular and subcortical white matter hypodensity compatible with chronic microvascular ischemic changes. No evidence for acute cortically based infarct, intracranial hemorrhage, mass lesion or mass-effect. Basal ganglia calcifications. Vascular: Unremarkable Skull: Intact. Sinuses/Orbits: Paranasal sinuses are well aerated. Mastoid air cells are unremarkable. Orbits are unremarkable. Other: None. CT CERVICAL SPINE FINDINGS Alignment: Normal anatomic alignment. Skull base and vertebrae: No acute fracture. No primary bone lesion or focal pathologic process. Soft tissues and spinal canal: No prevertebral fluid or swelling. No visible canal hematoma. Disc levels: Multilevel degenerative disc disease  most pronounced C5-6 and C6-7. no acute fracture. Upper chest: Unremarkable. Other: Bilateral carotid arterial vascular calcifications. IMPRESSION: No acute intracranial process. No acute cervical spine fracture. Electronically Signed   By: Lovey Newcomer M.D.   On: 06/08/2018 13:41   Ct Cervical Spine Wo Contrast  Result Date: 06/08/2018 CLINICAL DATA:  Patient status post fall. No reported loss of consciousness. EXAM: CT HEAD WITHOUT CONTRAST CT CERVICAL SPINE WITHOUT CONTRAST TECHNIQUE: Multidetector CT imaging of the head and cervical spine was performed following the standard protocol without intravenous contrast. Multiplanar CT image reconstructions of the cervical spine  were also generated. COMPARISON:  Cervical spine CT 06/27/2016 FINDINGS: CT HEAD FINDINGS Brain: Ventricles and sulci are prominent compatible with atrophy. Periventricular and subcortical white matter hypodensity compatible with chronic microvascular ischemic changes. No evidence for acute cortically based infarct, intracranial hemorrhage, mass lesion or mass-effect. Basal ganglia calcifications. Vascular: Unremarkable Skull: Intact. Sinuses/Orbits: Paranasal sinuses are well aerated. Mastoid air cells are unremarkable. Orbits are unremarkable. Other: None. CT CERVICAL SPINE FINDINGS Alignment: Normal anatomic alignment. Skull base and vertebrae: No acute fracture. No primary bone lesion or focal pathologic process. Soft tissues and spinal canal: No prevertebral fluid or swelling. No visible canal hematoma. Disc levels: Multilevel degenerative disc disease most pronounced C5-6 and C6-7. no acute fracture. Upper chest: Unremarkable. Other: Bilateral carotid arterial vascular calcifications. IMPRESSION: No acute intracranial process. No acute cervical spine fracture. Electronically Signed   By: Lovey Newcomer M.D.   On: 06/08/2018 13:41    Procedures Procedures (including critical care time)  Medications Ordered in ED Medications - No data to display   Initial Impression / Assessment and Plan / ED Course  I have reviewed the triage vital signs and the nursing notes.  Pertinent labs & imaging results that were available during my care of the patient were reviewed by me and considered in my medical decision making (see chart for details).  Clinical Course as of Jun 10 2141  Tue Jun 10, 2018  1304 Patient's initial EKG given him he is slightly tachycardic at 106 and A. fib low voltage no acute ST-T changes.  I looked through prior notes and she was in A. fib during the last visit and she is on Coumadin but I do not see it clearly documented that she is supposed to be in A. fib.  On her discharge from [facility]  they document an episode of PAT and she also had an upper extremity DVT for which she got put on Coumadin.   [MB]  1352 Nurse called the facility to find out their concerns.  Sounds like she is had some difficulty using her PICC line and has had multiple replacements.  They have been trying to orally replace her potassium but it has not been working.  Also there is some concern that 1 of her JP drains is pulled out and they put a dressing over it.  The notes that they had contacted Dr. Georgette Dover at [facility] and if the patient needed to be admitted he would rather her down there.  Patient herself denies any abdominal pain and it does not sound like there is been much drainage.   [MB]  1517 The nurses have contacted the IV team and they were going to try some alteplase to open up 1 of the ports on her PICC.    [MB]  6160 IV team is been able to get the back working again.  She should start getting her fluids and magnesium soon.   [  MB]  6503 IV team asked to get a chest x-ray to evaluate catheter placement.  When compared to when IR placed it it looks like it is been withdrawn about 4 cm.  They recommended scabbing and arm x-ray to make sure is not kinked somewhere.  They are also recommending that this does not stay in this position with her getting Vanco.  She potentially would need to be admitted.   [MB]  5465 They reviewed the x-ray now and do not see any kinks in it.  They are not using the IV for any particular infusions and so positioning in the SVC is okay.  It is working now and they are able to do the blood draws.  As far as the patient's work-up her lab work is fairly baseline other than a rising creatinine and a slightly low magnesium.  She is received some IV fluids here and some IV magnesium.  She is hoping to be transferred back to her facility so she can work on getting discharged.  I do not see any obvious reason for admission.     [MB]  6812 The drain that fell out was not putting out much fluid  anyways and she has no abdominal pain white count or fever.   [MB]    Clinical Course User Index [MB] Hayden Rasmussen, MD      Final Clinical Impressions(s) / ED Diagnoses   Final diagnoses:  Hypomagnesemia  Dehydration    ED Discharge Orders    None       Hayden Rasmussen, MD 06/10/18 2143

## 2018-06-11 ENCOUNTER — Other Ambulatory Visit (HOSPITAL_COMMUNITY)
Admission: RE | Admit: 2018-06-11 | Discharge: 2018-06-11 | Disposition: A | Discharge status: Home / Self Care | Payer: Medicare Other | Source: Skilled Nursing Facility | Attending: Internal Medicine | Admitting: Internal Medicine

## 2018-06-11 DIAGNOSIS — Z48815 Encounter for surgical aftercare following surgery on the digestive system: Secondary | ICD-10-CM | POA: Insufficient documentation

## 2018-06-11 LAB — SEDIMENTATION RATE: Sed Rate: 35 mm/hr — ABNORMAL HIGH (ref 0–22)

## 2018-06-11 LAB — CBC WITH DIFFERENTIAL/PLATELET
Abs Immature Granulocytes: 0.04 10*3/uL (ref 0.00–0.07)
Basophils Absolute: 0.1 10*3/uL (ref 0.0–0.1)
Basophils Relative: 2 %
Eosinophils Absolute: 0.4 10*3/uL (ref 0.0–0.5)
Eosinophils Relative: 5 %
HEMATOCRIT: 35.1 % — AB (ref 36.0–46.0)
Hemoglobin: 10.3 g/dL — ABNORMAL LOW (ref 12.0–15.0)
Immature Granulocytes: 1 %
LYMPHS PCT: 13 %
Lymphs Abs: 0.9 10*3/uL (ref 0.7–4.0)
MCH: 31 pg (ref 26.0–34.0)
MCHC: 29.3 g/dL — AB (ref 30.0–36.0)
MCV: 105.7 fL — ABNORMAL HIGH (ref 80.0–100.0)
Monocytes Absolute: 1 10*3/uL (ref 0.1–1.0)
Monocytes Relative: 14 %
Neutro Abs: 4.6 10*3/uL (ref 1.7–7.7)
Neutrophils Relative %: 65 %
Platelets: 368 10*3/uL (ref 150–400)
RBC: 3.32 MIL/uL — ABNORMAL LOW (ref 3.87–5.11)
RDW: 20.1 % — ABNORMAL HIGH (ref 11.5–15.5)
WBC: 6.9 10*3/uL (ref 4.0–10.5)
nRBC: 0 % (ref 0.0–0.2)

## 2018-06-11 LAB — BASIC METABOLIC PANEL
Anion gap: 9 (ref 5–15)
BUN: 32 mg/dL — ABNORMAL HIGH (ref 8–23)
CO2: 19 mmol/L — ABNORMAL LOW (ref 22–32)
Calcium: 8.9 mg/dL (ref 8.9–10.3)
Chloride: 112 mmol/L — ABNORMAL HIGH (ref 98–111)
Creatinine, Ser: 1.73 mg/dL — ABNORMAL HIGH (ref 0.44–1.00)
GFR calc Af Amer: 33 mL/min — ABNORMAL LOW (ref >60)
GFR calc non Af Amer: 29 mL/min — ABNORMAL LOW (ref >60)
GLUCOSE: 94 mg/dL (ref 70–99)
Potassium: 4.5 mmol/L (ref 3.5–5.1)
Sodium: 140 mmol/L (ref 135–145)

## 2018-06-11 LAB — PROTIME-INR
INR: 2.62
Prothrombin Time: 27.6 seconds — ABNORMAL HIGH (ref 11.4–15.2)

## 2018-06-11 LAB — C-REACTIVE PROTEIN: CRP: 3.3 mg/dL — AB (ref <1.0)

## 2018-06-12 ENCOUNTER — Encounter: Payer: Self-pay | Admitting: Internal Medicine

## 2018-06-12 ENCOUNTER — Non-Acute Institutional Stay (SKILLED_NURSING_FACILITY): Payer: Medicare Other | Admitting: Internal Medicine

## 2018-06-12 ENCOUNTER — Other Ambulatory Visit: Payer: Self-pay | Admitting: Surgery

## 2018-06-12 ENCOUNTER — Ambulatory Visit (HOSPITAL_COMMUNITY)
Admission: RE | Admit: 2018-06-12 | Discharge: 2018-06-12 | Disposition: A | Discharge status: Home / Self Care | Payer: Medicare Other | Source: Ambulatory Visit | Attending: Surgery | Admitting: Surgery

## 2018-06-12 ENCOUNTER — Other Ambulatory Visit: Payer: Self-pay | Admitting: *Deleted

## 2018-06-12 ENCOUNTER — Encounter (HOSPITAL_COMMUNITY)
Admission: RE | Admit: 2018-06-12 | Discharge: 2018-06-12 | Disposition: A | Discharge status: Home / Self Care | Payer: Medicare Other | Source: Skilled Nursing Facility | Attending: Internal Medicine | Admitting: Internal Medicine

## 2018-06-12 DIAGNOSIS — N200 Calculus of kidney: Secondary | ICD-10-CM | POA: Diagnosis not present

## 2018-06-12 DIAGNOSIS — E1169 Type 2 diabetes mellitus with other specified complication: Secondary | ICD-10-CM

## 2018-06-12 DIAGNOSIS — R1084 Generalized abdominal pain: Secondary | ICD-10-CM | POA: Diagnosis not present

## 2018-06-12 DIAGNOSIS — M797 Fibromyalgia: Secondary | ICD-10-CM

## 2018-06-12 DIAGNOSIS — N183 Chronic kidney disease, stage 3 unspecified: Secondary | ICD-10-CM

## 2018-06-12 DIAGNOSIS — K5720 Diverticulitis of large intestine with perforation and abscess without bleeding: Secondary | ICD-10-CM | POA: Diagnosis not present

## 2018-06-12 DIAGNOSIS — K5730 Diverticulosis of large intestine without perforation or abscess without bleeding: Secondary | ICD-10-CM | POA: Diagnosis not present

## 2018-06-12 DIAGNOSIS — D631 Anemia in chronic kidney disease: Secondary | ICD-10-CM | POA: Diagnosis not present

## 2018-06-12 DIAGNOSIS — Z48815 Encounter for surgical aftercare following surgery on the digestive system: Secondary | ICD-10-CM | POA: Insufficient documentation

## 2018-06-12 DIAGNOSIS — A419 Sepsis, unspecified organism: Secondary | ICD-10-CM | POA: Insufficient documentation

## 2018-06-12 DIAGNOSIS — G4700 Insomnia, unspecified: Secondary | ICD-10-CM | POA: Insufficient documentation

## 2018-06-12 LAB — BASIC METABOLIC PANEL
ANION GAP: 8 (ref 5–15)
BUN: 32 mg/dL — ABNORMAL HIGH (ref 8–23)
CO2: 18 mmol/L — ABNORMAL LOW (ref 22–32)
Calcium: 8.8 mg/dL — ABNORMAL LOW (ref 8.9–10.3)
Chloride: 113 mmol/L — ABNORMAL HIGH (ref 98–111)
Creatinine, Ser: 1.92 mg/dL — ABNORMAL HIGH (ref 0.44–1.00)
GFR calc Af Amer: 29 mL/min — ABNORMAL LOW (ref >60)
GFR calc non Af Amer: 25 mL/min — ABNORMAL LOW (ref >60)
Glucose, Bld: 122 mg/dL — ABNORMAL HIGH (ref 70–99)
Potassium: 4.5 mmol/L (ref 3.5–5.1)
Sodium: 139 mmol/L (ref 135–145)

## 2018-06-12 LAB — MAGNESIUM: Magnesium: 1.5 mg/dL — ABNORMAL LOW (ref 1.7–2.4)

## 2018-06-12 LAB — PROTIME-INR
INR: 2.72
PROTHROMBIN TIME: 28.5 s — AB (ref 11.4–15.2)

## 2018-06-12 NOTE — Progress Notes (Deleted)
Location:    [place] Room Number: 144/P Place of Service:  SNF [phone]) Provider:  Cory Roughen, MD  Patient Care Team: [name], MD as PCP - General (Internal Medicine) Neldon Labella, RN as Registered Nurse  Extended Emergency Contact Information Primary Emergency Contact: Mainwaring,Robert Address: [address]           [place], [place] [address] Johnnette Litter of [place] Phone: [phone] Mobile Phone: [phone] Relation: Spouse  Code Status:  Full Code Goals of care: Advanced Directive information Advanced Directives 06/12/2018  Does Patient Have a Medical Advance Directive? Yes  Type of Advance Directive (No Data)  Does patient want to make changes to medical advance directive? No - Patient declined  Would patient like information on creating a medical advance directive? No - Patient declined  Pre-existing out of facility DNR order (yellow form or pink MOST form) -     Chief Complaint  Patient presents with  . Acute Visit    F/U Low Magnesium    HPI:  Pt is a 73 y.o. female seen today for an acute visit for    Past Medical History:  Diagnosis Date  . Anginal pain [facility])    sees Dr Einar Gip.   . Arthritis    rheumatoid ..   . Diabetes mellitus without complication (HCC)    Metformin 2.3.2017  . Diverticulitis   . Diverticulitis of large intestine with perforation and abscess 04/05/2018  . Dysrhythmia   . Fibromyalgia   . GERD (gastroesophageal reflux disease)   . High cholesterol   . Hypertension   . Liver disease 08- 2012   per Dr Dagmar Hait pt has enlarged liver  . Peripheral vascular disease (HCC)    legs  . Pneumonia 06/17/2016  . Sleep apnea    sleep study  oct 2012  . Stroke [facility]) 2011   [facility]     Past Surgical History:  Procedure Laterality Date  . [place]  . CHOLECYSTECTOMY  1996  . COLONOSCOPY N/A 02/13/2018   Procedure: COLONOSCOPY;  Surgeon: Rogene Houston, MD;   Location[facility];  Service: Endoscopy;  Laterality: N/A;  8:30  . CORONARY ARTERY BYPASS GRAFT  1986  . ESOPHAGEAL DILATION N/A 01/13/2018   Procedure: ESOPHAGEAL DILATION;  Surgeon: Rogene Houston, MD;  [facility];  Service: Endoscopy;  Laterality: N/A;  . ESOPHAGOGASTRODUODENOSCOPY N/A 01/13/2018   Procedure: ESOPHAGOGASTRODUODENOSCOPY (EGD);  Surgeon: Rogene Houston, MD;  [facility];  Service: Endoscopy;  Laterality: N/A;  . EYE SURGERY  2013   cat ext bilateral .  . EYE SURGERY  2002   laser  . FOREIGN BODY REMOVAL  02/13/2018   Procedure: FOREIGN BODY REMOVAL;  Surgeon: Rogene Houston, MD;  [facility];  Service: Endoscopy;;  foreign body removal which appears to be food debri in a stem form removed from colon by DR. Rehman  . [place]- 2007   rod right leg - right wrist plate  . GAS/FLUID EXCHANGE  03/25/2012   Procedure: GAS/FLUID EXCHANGE;  Surgeon: Hayden Pedro, MD;  Location: [place];  Service: Ophthalmology;  Laterality: Right;  . LAPAROTOMY N/A 04/30/2018   Procedure: EXPLORATORY LAPAROTOMY  LYSIS OF ADHESIONS SMALL BOWEL RESECTION WITH ANASTOMSIS LOOP ILEOSTOMY PLACEMENT OF DRAINS AND WOUND [place];  Surgeon: Donnie Mesa, MD;  Location: [place];  Service: General;  Laterality: N/A;  .  PARS PLANA VITRECTOMY  03/25/2012   Procedure: PARS PLANA VITRECTOMY WITH 25 GAUGE;  Surgeon: Hayden Pedro, MD;  Location: [place];  Service: Ophthalmology;  Laterality: Right;    Allergies  Allergen Reactions  . Lactose Intolerance (Gi) Other (See Comments)    G.I. Upset  . Butrans [Buprenorphine] Rash and Other (See Comments)    Infected skin underneath application  . Simvastatin Rash    Outpatient Encounter Medications as of 06/12/2018  Medication Sig  . clopidogrel (PLAVIX) 75 MG tablet Take 1 tablet (75 mg total) by mouth daily with breakfast.  . diclofenac sodium (VOLTAREN) 1 % GEL Apply 2 g topically 4 (four) times daily as  needed (Pain).   Marland Kitchen diltiazem (CARDIZEM CD) 120 MG 24 hr capsule Take 1 capsule (120 mg total) by mouth daily.  . ferrous sulfate 325 (65 FE) MG tablet Take 1 tablet (325 mg total) by mouth 2 (two) times daily with a meal.  . HYDROcodone-acetaminophen (NORCO) 10-325 MG tablet Take 1 tablet by mouth 4 (four) times daily.  . insulin aspart (NOVOLOG FLEXPEN) 100 UNIT/ML FlexPen Give per sliding scale 2 times a day, 140-199 - 2 units, 200-250 - 4 units, 251-299 - 6 units,  300-349 - 8 units,  350 or above 10 units. Dispense syringes and needles as needed, Ok to switch to PEN if approved. Substitute to any brand approved. DX DM2, Code E11.65  . isosorbide dinitrate (ISORDIL) 5 MG tablet Take 1 tablet (5 mg total) by mouth 3 (three) times daily before meals.  Marland Kitchen leflunomide (ARAVA) 20 MG tablet Take 20 mg by mouth daily.  . metoprolol succinate (TOPROL-XL) 25 MG 24 hr tablet Take 25 mg by mouth daily.  Marland Kitchen PARoxetine (PAXIL) 20 MG tablet Take 20 mg by mouth daily.  . potassium chloride SA (K-DUR,KLOR-CON) 20 MEQ tablet Take 20 mEq by mouth daily.  . pravastatin (PRAVACHOL) 40 MG tablet Take 40 mg by mouth at bedtime.  . predniSONE (DELTASONE) 5 MG tablet Take 5 mg by mouth daily with breakfast.  . pregabalin (LYRICA) 50 MG capsule Take 1 capsule (50 mg total) by mouth 3 (three) times daily.  Marland Kitchen SLOW-MAG 71.5-119 MG TBEC SR tablet Give 143 mg by mouth three times a day  . Tafluprost (ZIOPTAN) 0.0015 % SOLN Place 1 drop into both eyes at bedtime.   . temazepam (RESTORIL) 30 MG capsule Take 1 capsule (30 mg total) by mouth at bedtime.  . timolol (BETIMOL) 0.5 % ophthalmic solution Place 1 drop into both eyes 2 (two) times daily.   . Tofacitinib Citrate (XELJANZ) 5 MG TABS Take 1 tablet by mouth daily.  . Vitamin D, Ergocalciferol, (DRISDOL) 1.25 MG (50000 UT) CAPS capsule Take 50,000 Units by mouth every Thursday.   . warfarin (COUMADIN) 1 MG tablet Take 1.5 mg by mouth every evening.   . [DISCONTINUED]  fluconazole (DIFLUCAN) 200 MG tablet Take 2 tablets (400 mg total) by mouth daily. (Patient taking differently: Take 400 mg by mouth daily. 4 week course starting on 05/14/2018)  . [DISCONTINUED] magnesium chloride (SLOW-MAG) 64 MG TBEC SR tablet Take 2 tablets by mouth 3 (three) times daily.  . [DISCONTINUED] Magnesium Oxide 400 (240 Mg) MG TABS Take 1 tablet (400 mg total) by mouth daily.   No facility-administered encounter medications on file as of 06/12/2018.     Review of Systems  Immunization History  Administered Date(s) Administered  . Influenza Split 03/26/2012, 03/11/2013  . Influenza, High Dose Seasonal PF 04/07/2018  Pertinent  Health Maintenance Due  Topic Date Due  . FOOT EXAM  06/14/2018 (Originally 08/14/1955)  . MAMMOGRAM  06/14/2018 (Originally 11/17/2016)  . OPHTHALMOLOGY EXAM  06/14/2018 (Originally 08/14/1955)  . URINE MICROALBUMIN  06/14/2018 (Originally 08/14/1955)  . DEXA SCAN  06/14/2018 (Originally 08/13/2009)  . PNA vac Low Risk Adult (1 of 2 - PCV13) 06/14/2018 (Originally 08/13/2009)  . HEMOGLOBIN A1C  10/07/2018  . COLONOSCOPY  02/14/2028  . INFLUENZA VACCINE  Completed   No flowsheet data found. Functional Status Survey:    Vitals:   06/12/18 1641  BP: (!) 147/81  Pulse: 60  Resp: 18  Temp: 98.2 F (36.8 C)  TempSrc: Oral  SpO2: 96%   There is no height or weight on file to calculate BMI. Physical Exam  Labs reviewed: Recent Labs    05/06/18 0406 05/07/18 0348  05/09/18 0429  06/10/18 0700 06/10/18 1314 06/11/18 0650 06/12/18 0700  NA 143 141   < > 136   < > 138 137 140 139  K 4.0 3.9   < > 3.3*   < > 4.5 5.0 4.5 4.5  CL 102 101   < > 101   < > 111 111 112* 113*  CO2 31 30   < > 28   < > 19* 15* 19* 18*  GLUCOSE 113* 121*   < > 90   < > 104* 144* 94 122*  BUN 50* 48*   < > 32*   < > 29* 32* 32* 32*  CREATININE 1.14* 1.12*   < > 1.00   < > 1.60* 1.75* 1.73* 1.92*  CALCIUM 8.1* 7.9*   < > 7.8*   < > 8.7* 8.7* 8.9 8.8*  MG 1.8 1.5*   < > 1.8    < > 1.2* 1.4*  --  1.5*  PHOS 5.7* 4.7*  --  3.6  --   --   --   --   --    < > = values in this interval not displayed.   Recent Labs    05/05/18 0351 05/07/18 0348 05/13/18 0412  AST 64* 44* 22  ALT 47* 42 16  ALKPHOS 93 101 81  BILITOT 0.4 0.6 0.6  PROT 4.9* 5.0* 4.9*  ALBUMIN 1.7* 1.8* 1.6*   Recent Labs    06/06/18 1207 06/08/18 1220 06/10/18 1314 06/11/18 0650  WBC 9.0 7.1 7.6 6.9  NEUTROABS 7.2 5.3  --  4.6  HGB 10.0* 10.3* 10.3* 10.3*  HCT 33.6* 34.9* 35.7* 35.1*  MCV 104.3* 103.9* 105.9* 105.7*  PLT 352 377 379 368   Lab Results  Component Value Date   TSH 7.944 (H) 05/07/2018   Lab Results  Component Value Date   HGBA1C 5.9 (H) 04/07/2018   Lab Results  Component Value Date   CHOL 145 06/21/2013   HDL 45 06/21/2013   LDLCALC 65 06/21/2013   TRIG 185 (H) 05/05/2018   CHOLHDL 3.2 06/21/2013    Significant Diagnostic Results in last 30 days:  Ct Abdomen Pelvis Wo Contrast  Result Date: 06/12/2018 CLINICAL DATA:  73 year old who underwent laparotomy 04/30/2018 for perforated sigmoid diverticulitis and multiple intra-abdominal abscesses for which she underwent lysis of adhesions, short segment small-bowel resection and diverting loop ileostomy. She presents now with abdominal pain and fever. EXAM: CT ABDOMEN AND PELVIS WITHOUT CONTRAST TECHNIQUE: Multidetector CT imaging of the abdomen and pelvis was performed following the standard protocol without IV contrast. Oral contrast was administered. COMPARISON:  05/07/2018, 04/27/2018 and earlier.  FINDINGS: Lower chest: Mild scarring and bronchiectasis involving the lower lobes. Scarring involving the RIGHT MIDDLE LOBE. No new abnormalities involving the lung bases. Heart mildly enlarged but stable. Mitral annular calcification again noted. Hepatobiliary: Normal unenhanced appearance of the liver. Surgically absent gallbladder. No biliary ductal dilation. Pancreas: Mild diffuse atrophy. Otherwise normal unenhanced  appearance. Spleen: Normal unenhanced appearance. Adrenals/Urinary Tract: Normal appearing adrenal glands. Non-obstructing approximate 4 mm calculus in a LOWER pole calyx of the RIGHT kidney. No urinary tract calculi elsewhere. No hydronephrosis. Allowing for the unenhanced technique, no focal parenchymal abnormality involving either kidney. Small diverticulum arising from the RIGHT posterolateral wall of the urinary bladder. Opaque material within the dependent portion of the urinary bladder. Stomach/Bowel: Normal-appearing stomach containing opaque ingested tablets. Normal-appearing small bowel. Small bowel surgical anastomosis in the LEFT mid abdomen at the level of the umbilicus. Normal-appearing ileostomy in the RIGHT mid abdomen. Contrast material passes through the small bowel into the ostomy bag without obstruction. Decompressed colon with diffuse diverticulosis, including extensive descending and sigmoid colon diverticulosis. No evidence of diverticulitis. Vascular/Lymphatic: Severe aortoiliofemoral atherosclerosis without evidence of aneurysm. No pathologic lymphadenopathy. Reproductive: Normal-appearing uterus and ovaries without evidence of adnexal mass. Other: Percutaneous catheter within a completely drained abscess in the RIGHT UPPER pelvis. Surgical drain in the pelvis. No evidence of intra-abdominal or pelvic abscess currently. No ascites. Midline surgical incision without evidence of incisional hernia or abscess. Musculoskeletal: Multilevel degenerative disc disease, spondylosis and facet degenerative changes throughout the lumbar spine. Degenerative changes involving both hips. No acute findings. IMPRESSION: 1. No acute abnormalities involving the abdomen or pelvis. 2. No evidence of intra-abdominal or pelvic abscess currently. Percutaneous catheter within a completely drained abscess in the RIGHT UPPER pelvis. 3. Non-obstructing approximate 4 mm calculus in a LOWER pole calyx of the RIGHT kidney.  No urinary tract calculi elsewhere. 4. Diffuse colonic diverticulosis, including extensive descending and sigmoid colon diverticulosis. No evidence of diverticulitis. Aortic Atherosclerosis (ICD10-170.0) Electronically Signed   By: Evangeline Dakin M.D.   On: 06/12/2018 11:21   Ct Head Wo Contrast  Result Date: 06/08/2018 CLINICAL DATA:  Patient status post fall. No reported loss of consciousness. EXAM: CT HEAD WITHOUT CONTRAST CT CERVICAL SPINE WITHOUT CONTRAST TECHNIQUE: Multidetector CT imaging of the head and cervical spine was performed following the standard protocol without intravenous contrast. Multiplanar CT image reconstructions of the cervical spine were also generated. COMPARISON:  Cervical spine CT 06/27/2016 FINDINGS: CT HEAD FINDINGS Brain: Ventricles and sulci are prominent compatible with atrophy. Periventricular and subcortical white matter hypodensity compatible with chronic microvascular ischemic changes. No evidence for acute cortically based infarct, intracranial hemorrhage, mass lesion or mass-effect. Basal ganglia calcifications. Vascular: Unremarkable Skull: Intact. Sinuses/Orbits: Paranasal sinuses are well aerated. Mastoid air cells are unremarkable. Orbits are unremarkable. Other: None. CT CERVICAL SPINE FINDINGS Alignment: Normal anatomic alignment. Skull base and vertebrae: No acute fracture. No primary bone lesion or focal pathologic process. Soft tissues and spinal canal: No prevertebral fluid or swelling. No visible canal hematoma. Disc levels: Multilevel degenerative disc disease most pronounced C5-6 and C6-7. no acute fracture. Upper chest: Unremarkable. Other: Bilateral carotid arterial vascular calcifications. IMPRESSION: No acute intracranial process. No acute cervical spine fracture. Electronically Signed   By: Lovey Newcomer M.D.   On: 06/08/2018 13:41   Ct Cervical Spine Wo Contrast  Result Date: 06/08/2018 CLINICAL DATA:  Patient status post fall. No reported loss of  consciousness. EXAM: CT HEAD WITHOUT CONTRAST CT CERVICAL SPINE WITHOUT CONTRAST TECHNIQUE: Multidetector CT imaging of  the head and cervical spine was performed following the standard protocol without intravenous contrast. Multiplanar CT image reconstructions of the cervical spine were also generated. COMPARISON:  Cervical spine CT 06/27/2016 FINDINGS: CT HEAD FINDINGS Brain: Ventricles and sulci are prominent compatible with atrophy. Periventricular and subcortical white matter hypodensity compatible with chronic microvascular ischemic changes. No evidence for acute cortically based infarct, intracranial hemorrhage, mass lesion or mass-effect. Basal ganglia calcifications. Vascular: Unremarkable Skull: Intact. Sinuses/Orbits: Paranasal sinuses are well aerated. Mastoid air cells are unremarkable. Orbits are unremarkable. Other: None. CT CERVICAL SPINE FINDINGS Alignment: Normal anatomic alignment. Skull base and vertebrae: No acute fracture. No primary bone lesion or focal pathologic process. Soft tissues and spinal canal: No prevertebral fluid or swelling. No visible canal hematoma. Disc levels: Multilevel degenerative disc disease most pronounced C5-6 and C6-7. no acute fracture. Upper chest: Unremarkable. Other: Bilateral carotid arterial vascular calcifications. IMPRESSION: No acute intracranial process. No acute cervical spine fracture. Electronically Signed   By: Lovey Newcomer M.D.   On: 06/08/2018 13:41   Dg Chest Port 1 View  Result Date: 06/10/2018 CLINICAL DATA:  PICC placement. EXAM: PORTABLE CHEST 1 VIEW COMPARISON:  04/11/2018 FINDINGS: PICC tip is in the superior vena cava at the level of the azygos vein. Heart size and vascularity are normal and the lungs are clear. CABG. No acute bone abnormality. IMPRESSION: PICC in good position. No acute cardiopulmonary findings. Electronically Signed   By: Lorriane Shire M.D.   On: 06/10/2018 17:51   Dg Chest Port 1v Same Day  Result Date:  06/10/2018 CLINICAL DATA:  PICC placement. EXAM: PORTABLE CHEST 1 VIEW 6:15 p.m. COMPARISON:  06/10/2018 at 5:24 p.m. FINDINGS: PICC tip remains unchanged in position in the superior vena cava at the level of the azygos vein. Heart size and vascularity are normal. Lungs are clear. CABG. IMPRESSION: No change in the PICC tip in the superior vena cava. No acute cardiopulmonary findings. Electronically Signed   By: Lorriane Shire M.D.   On: 06/10/2018 18:37   Dg Humerus Left  Result Date: 06/10/2018 CLINICAL DATA:  PICC line placement EXAM: LEFT HUMERUS - 2+ VIEW COMPARISON:  None. FINDINGS: There is a PICC line identified in the upper left arm in the expected course. The distal portion of the PICC line is not included on film. IMPRESSION: There is a PICC line identified in the upper left arm in the expected course. The distal portion of the PICC line is not included on film. Electronically Signed   By: Abelardo Diesel M.D.   On: 06/10/2018 18:02   Ir Picc Replacement Leftinc Imgguide  Result Date: 05/19/2018 INDICATION: Malfunctioning PICC line. Diverticular abscess. Need to continue IV antibiotics. EXAM: FLUOROSCOPIC GUIDED PICC LINE EXCHANGE TECHNIQUE: The procedure, risks, benefits, and alternatives were explained to the patient and informed written consent was obtained. The left upper extremity and external portion of the existing PICC line was prepped with chlorhexidine in a sterile fashion, and a sterile drape was applied covering the operative field. Maximum barrier sterile technique with sterile gowns and gloves were used for the procedure. A timeout was performed prior to the initiation of the procedure. Local anesthesia was provided with 1% lidocaine. The existing PICC line was cannulated with an 0.0018 wire which was advanced through the catheter. The catheter was exchanged for a peel-away sheath, ultimately allowing advancement of a 46-cm, 5- [place], double lumen PICC line to the level of the  superior caval atrial junction. A post procedure spot fluoroscopic image was  obtained. The catheter easily aspirated and flushed and was secured in place. A dressing was placed. The patient tolerated the procedure well without immediate post procedural complication. FINDINGS: After catheter exchange, the tip lies within the superior cavoatrial junction. The catheter aspirates and flushes normally, it was capped, and is ready for immediate use. IMPRESSION: Successful fluoroscopic guided exchange of left upper extremity approach 46 cm, 5 - [place], double lumen PICC with tip overlying the superior caval atrial junction. The PICC line is ready for immediate use. Read by: Gareth Eagle, PA-C Electronically Signed   By: Markus Daft M.D.   On: 05/19/2018 13:50    Assessment/Plan There are no diagnoses linked to this encounter.      Oralia Manis, [place]

## 2018-06-12 NOTE — Patient Outreach (Signed)
This RNCM notified by [facility] that patient will discharge 06/13/18 home with spouse and [facility]. Call to patient, she reports she is doing ok and is ready to get home. RNCM reviewed [facility] program again and patient states yes she could benefit from extra support services. She gave verbal consent for [facility] program referral. She only indicated need for RNCM at this time, may need Social Work or pharmacy at later time.  Patient has the [facility] care management packet that was given at last onsite visit.   Plan to make referral for [place] for Transition of Care. Will collaborate with Samil Mecham [facility]. This RNCM will sign off. Royetta Crochet. Laymond Purser, MSN, RN, [place] [phone]) Business Cell  [phone]) Toll Free Office

## 2018-06-13 ENCOUNTER — Inpatient Hospital Stay (HOSPITAL_COMMUNITY)
Admission: EM | Admit: 2018-06-13 | Discharge: 2018-06-17 | DRG: 683 | Disposition: A | Discharge status: Skilled Nursing Facility | Payer: Medicare Other | Attending: Internal Medicine | Admitting: Internal Medicine

## 2018-06-13 ENCOUNTER — Non-Acute Institutional Stay (SKILLED_NURSING_FACILITY): Payer: Medicare Other | Admitting: Internal Medicine

## 2018-06-13 ENCOUNTER — Encounter (HOSPITAL_COMMUNITY)
Admission: RE | Admit: 2018-06-13 | Discharge: 2018-06-13 | Disposition: A | Discharge status: Home / Self Care | Payer: Medicare Other | Source: Skilled Nursing Facility | Attending: *Deleted | Admitting: *Deleted

## 2018-06-13 ENCOUNTER — Encounter: Payer: Self-pay | Admitting: Internal Medicine

## 2018-06-13 ENCOUNTER — Encounter (HOSPITAL_COMMUNITY): Payer: Self-pay

## 2018-06-13 ENCOUNTER — Other Ambulatory Visit: Payer: Self-pay

## 2018-06-13 DIAGNOSIS — I2510 Atherosclerotic heart disease of native coronary artery without angina pectoris: Secondary | ICD-10-CM | POA: Diagnosis present

## 2018-06-13 DIAGNOSIS — M797 Fibromyalgia: Secondary | ICD-10-CM | POA: Diagnosis present

## 2018-06-13 DIAGNOSIS — E860 Dehydration: Secondary | ICD-10-CM | POA: Diagnosis not present

## 2018-06-13 DIAGNOSIS — N179 Acute kidney failure, unspecified: Principal | ICD-10-CM | POA: Diagnosis present

## 2018-06-13 DIAGNOSIS — Z9049 Acquired absence of other specified parts of digestive tract: Secondary | ICD-10-CM

## 2018-06-13 DIAGNOSIS — K219 Gastro-esophageal reflux disease without esophagitis: Secondary | ICD-10-CM | POA: Diagnosis present

## 2018-06-13 DIAGNOSIS — K5720 Diverticulitis of large intestine with perforation and abscess without bleeding: Secondary | ICD-10-CM | POA: Diagnosis present

## 2018-06-13 DIAGNOSIS — Z8261 Family history of arthritis: Secondary | ICD-10-CM

## 2018-06-13 DIAGNOSIS — Z7952 Long term (current) use of systemic steroids: Secondary | ICD-10-CM

## 2018-06-13 DIAGNOSIS — R531 Weakness: Secondary | ICD-10-CM | POA: Diagnosis not present

## 2018-06-13 DIAGNOSIS — E739 Lactose intolerance, unspecified: Secondary | ICD-10-CM | POA: Diagnosis present

## 2018-06-13 DIAGNOSIS — Z7902 Long term (current) use of antithrombotics/antiplatelets: Secondary | ICD-10-CM

## 2018-06-13 DIAGNOSIS — Z8673 Personal history of transient ischemic attack (TIA), and cerebral infarction without residual deficits: Secondary | ICD-10-CM

## 2018-06-13 DIAGNOSIS — Z932 Ileostomy status: Secondary | ICD-10-CM

## 2018-06-13 DIAGNOSIS — R29898 Other symptoms and signs involving the musculoskeletal system: Secondary | ICD-10-CM | POA: Diagnosis present

## 2018-06-13 DIAGNOSIS — I129 Hypertensive chronic kidney disease with stage 1 through stage 4 chronic kidney disease, or unspecified chronic kidney disease: Secondary | ICD-10-CM | POA: Diagnosis present

## 2018-06-13 DIAGNOSIS — I480 Paroxysmal atrial fibrillation: Secondary | ICD-10-CM | POA: Diagnosis not present

## 2018-06-13 DIAGNOSIS — E1151 Type 2 diabetes mellitus with diabetic peripheral angiopathy without gangrene: Secondary | ICD-10-CM | POA: Diagnosis present

## 2018-06-13 DIAGNOSIS — Z8249 Family history of ischemic heart disease and other diseases of the circulatory system: Secondary | ICD-10-CM

## 2018-06-13 DIAGNOSIS — I4891 Unspecified atrial fibrillation: Secondary | ICD-10-CM

## 2018-06-13 DIAGNOSIS — E785 Hyperlipidemia, unspecified: Secondary | ICD-10-CM | POA: Diagnosis present

## 2018-06-13 DIAGNOSIS — M069 Rheumatoid arthritis, unspecified: Secondary | ICD-10-CM | POA: Diagnosis not present

## 2018-06-13 DIAGNOSIS — Z7401 Bed confinement status: Secondary | ICD-10-CM

## 2018-06-13 DIAGNOSIS — G459 Transient cerebral ischemic attack, unspecified: Secondary | ICD-10-CM | POA: Diagnosis not present

## 2018-06-13 DIAGNOSIS — D631 Anemia in chronic kidney disease: Secondary | ICD-10-CM

## 2018-06-13 DIAGNOSIS — E1122 Type 2 diabetes mellitus with diabetic chronic kidney disease: Secondary | ICD-10-CM | POA: Diagnosis present

## 2018-06-13 DIAGNOSIS — E1169 Type 2 diabetes mellitus with other specified complication: Secondary | ICD-10-CM | POA: Diagnosis not present

## 2018-06-13 DIAGNOSIS — E669 Obesity, unspecified: Secondary | ICD-10-CM | POA: Diagnosis present

## 2018-06-13 DIAGNOSIS — G4730 Sleep apnea, unspecified: Secondary | ICD-10-CM | POA: Diagnosis present

## 2018-06-13 DIAGNOSIS — E7800 Pure hypercholesterolemia, unspecified: Secondary | ICD-10-CM | POA: Diagnosis present

## 2018-06-13 DIAGNOSIS — Z888 Allergy status to other drugs, medicaments and biological substances status: Secondary | ICD-10-CM

## 2018-06-13 DIAGNOSIS — Z7901 Long term (current) use of anticoagulants: Secondary | ICD-10-CM

## 2018-06-13 DIAGNOSIS — I10 Essential (primary) hypertension: Secondary | ICD-10-CM | POA: Diagnosis present

## 2018-06-13 DIAGNOSIS — D638 Anemia in other chronic diseases classified elsewhere: Secondary | ICD-10-CM | POA: Diagnosis present

## 2018-06-13 DIAGNOSIS — N183 Chronic kidney disease, stage 3 unspecified: Secondary | ICD-10-CM

## 2018-06-13 DIAGNOSIS — Z794 Long term (current) use of insulin: Secondary | ICD-10-CM

## 2018-06-13 DIAGNOSIS — G8929 Other chronic pain: Secondary | ICD-10-CM | POA: Diagnosis present

## 2018-06-13 DIAGNOSIS — Z6836 Body mass index (BMI) 36.0-36.9, adult: Secondary | ICD-10-CM

## 2018-06-13 DIAGNOSIS — E119 Type 2 diabetes mellitus without complications: Secondary | ICD-10-CM

## 2018-06-13 DIAGNOSIS — Z87891 Personal history of nicotine dependence: Secondary | ICD-10-CM

## 2018-06-13 DIAGNOSIS — Z951 Presence of aortocoronary bypass graft: Secondary | ICD-10-CM

## 2018-06-13 LAB — CBC WITH DIFFERENTIAL/PLATELET
ABS IMMATURE GRANULOCYTES: 0.04 10*3/uL (ref 0.00–0.07)
Abs Immature Granulocytes: 0.03 10*3/uL (ref 0.00–0.07)
Basophils Absolute: 0.1 10*3/uL (ref 0.0–0.1)
Basophils Absolute: 0.1 10*3/uL (ref 0.0–0.1)
Basophils Relative: 2 %
Basophils Relative: 1 %
Eosinophils Absolute: 0.4 10*3/uL (ref 0.0–0.5)
Eosinophils Absolute: 0.2 10*3/uL (ref 0.0–0.5)
Eosinophils Relative: 6 %
Eosinophils Relative: 3 %
HCT: 36.3 % (ref 36.0–46.0)
HCT: 38.2 % (ref 36.0–46.0)
Hemoglobin: 10.7 g/dL — ABNORMAL LOW (ref 12.0–15.0)
Hemoglobin: 11.2 g/dL — ABNORMAL LOW (ref 12.0–15.0)
Immature Granulocytes: 1 %
Immature Granulocytes: 0 %
LYMPHS PCT: 7 %
LYMPHS PCT: 6 %
Lymphs Abs: 0.5 10*3/uL — ABNORMAL LOW (ref 0.7–4.0)
Lymphs Abs: 0.4 10*3/uL — ABNORMAL LOW (ref 0.7–4.0)
MCH: 31.5 pg (ref 26.0–34.0)
MCH: 30.5 pg (ref 26.0–34.0)
MCHC: 29.5 g/dL — ABNORMAL LOW (ref 30.0–36.0)
MCHC: 29.3 g/dL — ABNORMAL LOW (ref 30.0–36.0)
MCV: 106.8 fL — ABNORMAL HIGH (ref 80.0–100.0)
MCV: 104.1 fL — ABNORMAL HIGH (ref 80.0–100.0)
Monocytes Absolute: 1.1 10*3/uL — ABNORMAL HIGH (ref 0.1–1.0)
Monocytes Absolute: 0.8 10*3/uL (ref 0.1–1.0)
Monocytes Relative: 16 %
Monocytes Relative: 10 %
Neutro Abs: 4.9 10*3/uL (ref 1.7–7.7)
Neutro Abs: 6.2 10*3/uL (ref 1.7–7.7)
Neutrophils Relative %: 68 %
Neutrophils Relative %: 80 %
Platelets: 339 10*3/uL (ref 150–400)
Platelets: 338 10*3/uL (ref 150–400)
RBC: 3.4 MIL/uL — ABNORMAL LOW (ref 3.87–5.11)
RBC: 3.67 MIL/uL — ABNORMAL LOW (ref 3.87–5.11)
RDW: 19.7 % — ABNORMAL HIGH (ref 11.5–15.5)
RDW: 19.4 % — ABNORMAL HIGH (ref 11.5–15.5)
WBC: 7.1 10*3/uL (ref 4.0–10.5)
WBC: 7.8 10*3/uL (ref 4.0–10.5)
nRBC: 0 % (ref 0.0–0.2)
nRBC: 0.3 % — ABNORMAL HIGH (ref 0.0–0.2)

## 2018-06-13 LAB — COMPREHENSIVE METABOLIC PANEL
ALK PHOS: 89 U/L (ref 38–126)
ALT: 26 U/L (ref 0–44)
ALT: 29 U/L (ref 0–44)
AST: 43 U/L — ABNORMAL HIGH (ref 15–41)
AST: 43 U/L — ABNORMAL HIGH (ref 15–41)
Albumin: 2.9 g/dL — ABNORMAL LOW (ref 3.5–5.0)
Albumin: 3.3 g/dL — ABNORMAL LOW (ref 3.5–5.0)
Alkaline Phosphatase: 97 U/L (ref 38–126)
Anion gap: 9 (ref 5–15)
Anion gap: 10 (ref 5–15)
BUN: 32 mg/dL — ABNORMAL HIGH (ref 8–23)
BUN: 34 mg/dL — ABNORMAL HIGH (ref 8–23)
CALCIUM: 9.3 mg/dL (ref 8.9–10.3)
CO2: 18 mmol/L — ABNORMAL LOW (ref 22–32)
CO2: 18 mmol/L — AB (ref 22–32)
CREATININE: 2 mg/dL — AB (ref 0.44–1.00)
Calcium: 9 mg/dL (ref 8.9–10.3)
Chloride: 111 mmol/L (ref 98–111)
Chloride: 109 mmol/L (ref 98–111)
Creatinine, Ser: 2.27 mg/dL — ABNORMAL HIGH (ref 0.44–1.00)
GFR calc Af Amer: 24 mL/min — ABNORMAL LOW (ref >60)
GFR calc non Af Amer: 24 mL/min — ABNORMAL LOW (ref >60)
GFR calc non Af Amer: 21 mL/min — ABNORMAL LOW (ref >60)
GFR, EST AFRICAN AMERICAN: 28 mL/min — AB (ref >60)
GLUCOSE: 145 mg/dL — AB (ref 70–99)
Glucose, Bld: 85 mg/dL (ref 70–99)
Potassium: 5 mmol/L (ref 3.5–5.1)
Potassium: 5.5 mmol/L — ABNORMAL HIGH (ref 3.5–5.1)
SODIUM: 138 mmol/L (ref 135–145)
Sodium: 137 mmol/L (ref 135–145)
Total Bilirubin: 0.6 mg/dL (ref 0.3–1.2)
Total Bilirubin: 0.5 mg/dL (ref 0.3–1.2)
Total Protein: 5.7 g/dL — ABNORMAL LOW (ref 6.5–8.1)
Total Protein: 7 g/dL (ref 6.5–8.1)

## 2018-06-13 LAB — URINALYSIS, ROUTINE W REFLEX MICROSCOPIC
Bacteria, UA: NONE SEEN
Bilirubin Urine: NEGATIVE
GLUCOSE, UA: NEGATIVE mg/dL
Ketones, ur: NEGATIVE mg/dL
Leukocytes, UA: NEGATIVE
Nitrite: NEGATIVE
Protein, ur: NEGATIVE mg/dL
Specific Gravity, Urine: 1.009 (ref 1.005–1.030)
pH: 5 (ref 5.0–8.0)

## 2018-06-13 LAB — CBG MONITORING, ED: Glucose-Capillary: 90 mg/dL (ref 70–99)

## 2018-06-13 LAB — PROTIME-INR
INR: 2.81
INR: 2.56
Prothrombin Time: 29.2 seconds — ABNORMAL HIGH (ref 11.4–15.2)
Prothrombin Time: 27.2 seconds — ABNORMAL HIGH (ref 11.4–15.2)

## 2018-06-13 LAB — MAGNESIUM
MAGNESIUM: 1.3 mg/dL — AB (ref 1.7–2.4)
Magnesium: 1.3 mg/dL — ABNORMAL LOW (ref 1.7–2.4)

## 2018-06-13 LAB — ETHANOL: Alcohol, Ethyl (B): <10 mg/dL (ref <10)

## 2018-06-13 MED ORDER — HYDROCODONE-ACETAMINOPHEN 10-325 MG PO TABS: 1.0000 | ORAL_TABLET | Freq: Four times a day (QID) | ORAL | Status: DC | PRN

## 2018-06-13 MED ORDER — TEMAZEPAM 15 MG PO CAPS
30.0000 mg | ORAL_CAPSULE | Freq: Every day | ORAL | Status: DC
  Administered 2018-06-14 – 2018-06-16 (×4): 30 mg via ORAL
  Filled 2018-06-13 (×4): qty 2

## 2018-06-13 MED ORDER — WARFARIN SODIUM 1 MG PO TABS
1.0000 mg | ORAL_TABLET | Freq: Once | ORAL | Status: AC
  Administered 2018-06-14: 1 mg via ORAL
  Filled 2018-06-13 (×2): qty 1

## 2018-06-13 MED ORDER — MAGNESIUM SULFATE 2 GM/50ML IV SOLN
2.0000 g | INTRAVENOUS | Status: AC
  Administered 2018-06-13: 2 g via INTRAVENOUS
  Filled 2018-06-13: qty 50

## 2018-06-13 MED ORDER — LATANOPROST 0.005 % OP SOLN
1.0000 [drp] | Freq: Every day | OPHTHALMIC | Status: DC
  Administered 2018-06-14: 1 [drp] via OPHTHALMIC
  Filled 2018-06-13 (×2): qty 2.5

## 2018-06-13 MED ORDER — DILTIAZEM HCL 25 MG/5ML IV SOLN
20.0000 mg | Freq: Once | INTRAVENOUS | Status: AC
  Administered 2018-06-13: 20 mg via INTRAVENOUS
  Filled 2018-06-13: qty 5

## 2018-06-13 MED ORDER — WARFARIN SODIUM 1 MG PO TABS: 1.5000 mg | ORAL_TABLET | Freq: Every evening | ORAL | Status: DC

## 2018-06-13 MED ORDER — PANTOPRAZOLE SODIUM 40 MG PO TBEC
40.0000 mg | DELAYED_RELEASE_TABLET | Freq: Every day | ORAL | Status: DC
  Administered 2018-06-14 – 2018-06-17 (×4): 40 mg via ORAL
  Filled 2018-06-13 (×4): qty 1

## 2018-06-13 MED ORDER — SODIUM CHLORIDE 0.9 % IV SOLN
INTRAVENOUS | Status: DC
  Administered 2018-06-13: 18:00:00 via INTRAVENOUS

## 2018-06-13 MED ORDER — CLOPIDOGREL BISULFATE 75 MG PO TABS
75.0000 mg | ORAL_TABLET | Freq: Every day | ORAL | Status: DC
  Administered 2018-06-14 – 2018-06-17 (×4): 75 mg via ORAL
  Filled 2018-06-13 (×4): qty 1

## 2018-06-13 MED ORDER — ONDANSETRON HCL 4 MG/2ML IJ SOLN: 4.0000 mg | Freq: Four times a day (QID) | INTRAMUSCULAR | Status: DC | PRN

## 2018-06-13 MED ORDER — INSULIN ASPART 100 UNIT/ML ~~LOC~~ SOLN: 0.0000 [IU] | Freq: Every day | SUBCUTANEOUS | Status: DC

## 2018-06-13 MED ORDER — DILTIAZEM HCL ER COATED BEADS 120 MG PO CP24
120.0000 mg | ORAL_CAPSULE | Freq: Every day | ORAL | Status: DC
  Administered 2018-06-14 – 2018-06-17 (×4): 120 mg via ORAL
  Filled 2018-06-13 (×6): qty 1

## 2018-06-13 MED ORDER — HYDROCODONE-ACETAMINOPHEN 10-325 MG PO TABS: ORAL_TABLET | ORAL | 0 refills | Status: AC

## 2018-06-13 MED ORDER — PAROXETINE HCL 20 MG PO TABS
20.0000 mg | ORAL_TABLET | Freq: Every day | ORAL | Status: DC
  Administered 2018-06-14 – 2018-06-17 (×4): 20 mg via ORAL
  Filled 2018-06-13 (×6): qty 1

## 2018-06-13 MED ORDER — MAGNESIUM CHLORIDE 64 MG PO TBEC
2.0000 | DELAYED_RELEASE_TABLET | Freq: Three times a day (TID) | ORAL | Status: DC
  Filled 2018-06-13 (×8): qty 2

## 2018-06-13 MED ORDER — TOFACITINIB CITRATE 5 MG PO TABS
1.0000 | ORAL_TABLET | Freq: Every day | ORAL | Status: DC
  Administered 2018-06-14: 5 mg via ORAL

## 2018-06-13 MED ORDER — METOPROLOL SUCCINATE ER 25 MG PO TB24
25.0000 mg | ORAL_TABLET | Freq: Every day | ORAL | Status: DC
  Administered 2018-06-14 – 2018-06-16 (×3): 25 mg via ORAL
  Filled 2018-06-13 (×3): qty 1

## 2018-06-13 MED ORDER — INSULIN ASPART 100 UNIT/ML ~~LOC~~ SOLN
0.0000 [IU] | Freq: Three times a day (TID) | SUBCUTANEOUS | Status: DC
  Administered 2018-06-14 – 2018-06-17 (×5): 2 [IU] via SUBCUTANEOUS

## 2018-06-13 MED ORDER — POTASSIUM CHLORIDE CRYS ER 20 MEQ PO TBCR
20.0000 meq | EXTENDED_RELEASE_TABLET | Freq: Every day | ORAL | Status: DC
  Administered 2018-06-14: 20 meq via ORAL
  Filled 2018-06-13 (×2): qty 1

## 2018-06-13 MED ORDER — TIMOLOL HEMIHYDRATE 0.5 % OP SOLN
1.0000 [drp] | Freq: Two times a day (BID) | OPHTHALMIC | Status: DC
  Administered 2018-06-14 – 2018-06-17 (×6): 1 [drp] via OPHTHALMIC
  Filled 2018-06-13: qty 5
  Filled 2018-06-13: qty 10
  Filled 2018-06-13: qty 5

## 2018-06-13 MED ORDER — PREDNISONE 10 MG PO TABS
5.0000 mg | ORAL_TABLET | Freq: Every day | ORAL | Status: DC
  Administered 2018-06-14 – 2018-06-17 (×4): 5 mg via ORAL
  Filled 2018-06-13 (×4): qty 1

## 2018-06-13 MED ORDER — ONDANSETRON HCL 4 MG PO TABS: 4.0000 mg | ORAL_TABLET | Freq: Four times a day (QID) | ORAL | Status: DC | PRN

## 2018-06-13 MED ORDER — ISOSORBIDE DINITRATE 10 MG PO TABS
5.0000 mg | ORAL_TABLET | Freq: Three times a day (TID) | ORAL | Status: DC
  Administered 2018-06-14 – 2018-06-17 (×9): 5 mg via ORAL
  Filled 2018-06-13 (×15): qty 0.5
  Filled 2018-06-13: qty 1
  Filled 2018-06-13 (×5): qty 0.5

## 2018-06-13 MED ORDER — ACETAMINOPHEN 650 MG RE SUPP: 650.0000 mg | Freq: Four times a day (QID) | RECTAL | Status: DC | PRN

## 2018-06-13 MED ORDER — ACETAMINOPHEN 325 MG PO TABS: 650.0000 mg | ORAL_TABLET | Freq: Four times a day (QID) | ORAL | Status: DC | PRN

## 2018-06-13 MED ORDER — WARFARIN - PHARMACIST DOSING INPATIENT
Status: DC
  Administered 2018-06-14 – 2018-06-16 (×3)

## 2018-06-13 MED ORDER — SODIUM CHLORIDE 0.9 % IV SOLN
INTRAVENOUS | Status: DC
  Administered 2018-06-13 – 2018-06-17 (×7): via INTRAVENOUS

## 2018-06-13 MED ORDER — LEFLUNOMIDE 20 MG PO TABS
20.0000 mg | ORAL_TABLET | Freq: Every day | ORAL | Status: DC
  Administered 2018-06-14 – 2018-06-16 (×3): 20 mg via ORAL
  Filled 2018-06-13 (×6): qty 1

## 2018-06-13 MED ORDER — PREGABALIN 50 MG PO CAPS
50.0000 mg | ORAL_CAPSULE | Freq: Three times a day (TID) | ORAL | Status: DC
  Administered 2018-06-14 – 2018-06-17 (×11): 50 mg via ORAL
  Filled 2018-06-13 (×11): qty 1

## 2018-06-13 MED ORDER — PRAVASTATIN SODIUM 40 MG PO TABS
40.0000 mg | ORAL_TABLET | Freq: Every day | ORAL | Status: DC
  Administered 2018-06-14 – 2018-06-16 (×3): 40 mg via ORAL
  Filled 2018-06-13 (×6): qty 1

## 2018-06-13 NOTE — Progress Notes (Addendum)
ANTICOAGULATION CONSULT NOTE - Initial Consult  Pharmacy Consult for Warfarin Indication: atrial fibrillation / Hx DVT  Allergies  Allergen Reactions  . Lactose Intolerance (Gi) Other (See Comments)    G.I. Upset  . Butrans [Buprenorphine] Rash and Other (See Comments)    Infected skin underneath application  . Simvastatin Rash    Patient Measurements: Height: 5\' 6"  (167.6 cm) Weight: 224 lb (101.6 kg) IBW/kg (Calculated) : 59.3 Heparin Dosing Weight:   Vital Signs: Temp: 97.7 F (36.5 C) (01/03 1657) Temp Source: Oral (01/03 1657) BP: 98/77 (01/03 1900) Pulse Rate: 99 (01/03 1900)  Labs: Recent Labs    06/11/18 0650 06/12/18 0700 06/13/18 0756 06/13/18 1719  HGB 10.3*  --  10.7* 11.2*  HCT 35.1*  --  36.3 38.2  PLT 368  --  339 338  LABPROT 27.6* 28.5* 29.2* 27.2*  INR 2.62 2.72 2.81 2.56  CREATININE 1.73* 1.92* 2.00* 2.27*    Estimated Creatinine Clearance: 26.6 mL/min (A) (by C-G formula based on SCr of 2.27 mg/dL (H)).   Medical History: Past Medical History:  Diagnosis Date  . Anginal pain [facility])    sees Dr Einar Gip.   . Arthritis    rheumatoid ..   . Diabetes mellitus without complication (HCC)    Metformin 2.3.2017  . Diverticulitis   . Diverticulitis of large intestine with perforation and abscess 04/05/2018  . Dysrhythmia   . Fibromyalgia   . GERD (gastroesophageal reflux disease)   . High cholesterol   . Hypertension   . Liver disease 08- 2012   per Dr Dagmar Hait pt has enlarged liver  . Peripheral vascular disease (HCC)    legs  . Pneumonia 06/17/2016  . Sleep apnea    sleep study  oct 2012  . Stroke [facility]) 2011   [place]      Medications:  Current Facility-Administered Medications  Medication Dose Route Frequency Provider Last Rate Last Dose  . 0.9 %  sodium chloride infusion   Intravenous Continuous Truett Mainland, DO 125 mL/hr at 06/13/18 2219    . acetaminophen (TYLENOL) tablet 650 mg  650 mg Oral Q6H PRN Truett Mainland, DO        Or  . acetaminophen (TYLENOL) suppository 650 mg  650 mg Rectal Q6H PRN Truett Mainland, DO      . [START ON 06/14/2018] clopidogrel (PLAVIX) tablet 75 mg  75 mg Oral Q breakfast Truett Mainland, DO      . [START ON 06/14/2018] diltiazem (CARDIZEM CD) 24 hr capsule 120 mg  120 mg Oral Daily Truett Mainland, DO      . HYDROcodone-acetaminophen (NORCO) 10-325 MG per tablet 1 tablet  1 tablet Oral Q6H PRN Truett Mainland, DO      . [START ON 06/14/2018] insulin aspart (novoLOG) injection 0-15 Units  0-15 Units Subcutaneous TID WC Truett Mainland, DO      . insulin aspart (novoLOG) injection 0-5 Units  0-5 Units Subcutaneous QHS Stinson, Jacob J, DO      . [START ON 06/14/2018] isosorbide dinitrate (ISORDIL) tablet 5 mg  5 mg Oral TID AC Stinson, Jacob J, DO      . latanoprost (XALATAN) 0.005 % ophthalmic solution 1 drop  1 drop Both Eyes QHS Truett Mainland, DO      . [START ON 06/14/2018] leflunomide (ARAVA) tablet 20 mg  20 mg Oral Daily Stinson, Jacob J, DO      . magnesium chloride (SLOW-MAG) 64 MG SR tablet  128 mg  2 tablet Oral TID Truett Mainland, DO      . [START ON 06/14/2018] metoprolol succinate (TOPROL-XL) 24 hr tablet 25 mg  25 mg Oral Daily Truett Mainland, DO      . ondansetron [facility]) tablet 4 mg  4 mg Oral Q6H PRN Truett Mainland, DO       Or  . ondansetron [facility]) injection 4 mg  4 mg Intravenous Q6H PRN Truett Mainland, DO      . [START ON 06/14/2018] pantoprazole (PROTONIX) EC tablet 40 mg  40 mg Oral Daily Truett Mainland, DO      . [START ON 06/14/2018] PARoxetine (PAXIL) tablet 20 mg  20 mg Oral Daily Truett Mainland, DO      . [START ON 06/14/2018] potassium chloride SA (K-DUR,KLOR-CON) CR tablet 20 mEq  20 mEq Oral Daily Stinson, Jacob J, DO      . pravastatin (PRAVACHOL) tablet 40 mg  40 mg Oral QHS Stinson, Jacob J, DO      . [START ON 06/14/2018] predniSONE (DELTASONE) tablet 5 mg  5 mg Oral Q breakfast Stinson, Jacob J, DO      . pregabalin (LYRICA) capsule 50 mg  50 mg  Oral TID Truett Mainland, DO      . temazepam (RESTORIL) capsule 30 mg  30 mg Oral QHS Stinson, Jacob J, DO      . timolol (BETIMOL) 0.5 % ophthalmic solution 1 drop  1 drop Both Eyes BID Truett Mainland, DO      . [START ON 06/14/2018] Tofacitinib Citrate TABS 5 mg  1 tablet Oral Daily Truett Mainland, DO      . warfarin (COUMADIN) tablet 1.5 mg  1.5 mg Oral QPM Truett Mainland, DO      . [START ON 06/14/2018] Warfarin - Pharmacist Dosing Inpatient   Does not apply Q24H Truett Mainland, DO       Current Outpatient Medications  Medication Sig Dispense Refill Last Dose  . clopidogrel (PLAVIX) 75 MG tablet Take 1 tablet (75 mg total) by mouth daily with breakfast. 90 tablet 6 06/13/2018  . Dexlansoprazole 30 MG capsule Take 30 mg by mouth daily.   Past Week at Unknown time  . diltiazem (CARDIZEM CD) 120 MG 24 hr capsule Take 1 capsule (120 mg total) by mouth daily. 30 capsule 0 06/13/2018  . ferrous sulfate 325 (65 FE) MG tablet Take 1 tablet (325 mg total) by mouth 2 (two) times daily with a meal.  3 Past Week at Unknown time  . HYDROcodone-acetaminophen (NORCO) 10-325 MG tablet Take 1 tablet by mouth 4 times a day as needed 10 tablet 0 06/13/2018  . insulin aspart (NOVOLOG FLEXPEN) 100 UNIT/ML FlexPen Give per sliding scale 2 times a day, 140-199 - 2 units, 200-250 - 4 units, 251-299 - 6 units,  300-349 - 8 units,  350 or above 10 units. Dispense syringes and needles as needed, Ok to switch to PEN if approved. Substitute to any brand approved. DX DM2, Code E11.65   06/13/2018 at Unknown time  . isosorbide dinitrate (ISORDIL) 5 MG tablet Take 1 tablet (5 mg total) by mouth 3 (three) times daily before meals. 90 tablet 1 06/13/2018  . leflunomide (ARAVA) 20 MG tablet Take 20 mg by mouth daily.   Past Week at Unknown time  . Magnesium 200 MG TABS Take 1 tablet by mouth daily.   Past Week at Unknown time  . metoprolol  succinate (TOPROL-XL) 25 MG 24 hr tablet Take 25 mg by mouth daily.   06/13/2018 at 0830  .  PARoxetine (PAXIL) 20 MG tablet Take 20 mg by mouth daily.   Past Week at Unknown time  . potassium chloride SA (K-DUR,KLOR-CON) 20 MEQ tablet Take 20 mEq by mouth daily.   06/13/2018  . pravastatin (PRAVACHOL) 40 MG tablet Take 40 mg by mouth at bedtime.   06/12/2018 at Unknown time  . predniSONE (DELTASONE) 5 MG tablet Take 5 mg by mouth daily with breakfast.   06/13/2018  . pregabalin (LYRICA) 50 MG capsule Take 1 capsule (50 mg total) by mouth 3 (three) times daily. 90 capsule 1 06/12/2018 at Unknown time  . SLOW-MAG 71.5-119 MG TBEC SR tablet Give 143 mg by mouth three times a day   06/13/2018  . Tafluprost (ZIOPTAN) 0.0015 % SOLN Place 1 drop into both eyes at bedtime.    Past Week at Unknown time  . temazepam (RESTORIL) 30 MG capsule Take 1 capsule (30 mg total) by mouth at bedtime. 30 capsule 2 06/12/2018 at Unknown time  . timolol (BETIMOL) 0.5 % ophthalmic solution Place 1 drop into both eyes 2 (two) times daily.    06/13/2018  . Tofacitinib Citrate (XELJANZ) 5 MG TABS Take 1 tablet by mouth daily.   06/13/2018  . Vitamin D, Ergocalciferol, (DRISDOL) 1.25 MG (50000 UT) CAPS capsule Take 50,000 Units by mouth every Thursday.    06/13/2018  . warfarin (COUMADIN) 1 MG tablet Take 1.5 mg by mouth every evening.    06/12/2018 at 1800  . diclofenac sodium (VOLTAREN) 1 % GEL Apply 2 g topically 4 (four) times daily as needed (Pain).     at Unknown time   Assessment: Okay for Protocol, INR at goal.  Updated information details last warfarin dose given 06/12/18.  Goal of Therapy:  INR 2-3   Plan:  Warfarin 1mg  PO x 1 Daily PT/INR Monitor for signs and symptoms of bleeding.   Pricilla Larsson 06/13/2018,10:48 PM

## 2018-06-13 NOTE — H&P (Signed)
History and Physical  Jessyca Sloan Josephson ZMO:[id number] DOB: 11-24-1945 DOA: 06/13/2018  Referring physician: Dr Sabra Heck, ED physician PCP: [name], MD  Outpatient Specialists:   Patient Coming From: home  Chief Complaint: weakness  HPI: LOVINIA SNARE is a 73 y.o. female with a history of diabetes, rheumatoid arthritis, fibromyalgia, GERD, hypertension, liver disease, history of stroke, recent hospitalization for diverticulitis with perforation and abscess in October 2019 until December 2019.  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  The patient still has a wound VAC in place.  Patient has been in rehab until this morning when she was finally discharged.  The patient went home, was wheeled up to her door, took 3 steps inside and her knees gave out the patient went down to the floor with assistance of the 3 people standing beside her.  She is been generally weak since then.  Denies fevers, chills, nausea, vomiting.  No palliating or provoking factors.  She needed great assistance moving from the stretcher to the hospital bed.  Emergency Department Course: Rate 125 upon arrival, which improved with 1 dose of Cardizem.  Patient in atrial fibrillation with rapid ventricular response.  Blood work shows creatinine of 2.27 (baseline a week ago was 0.7-0.85.  Her creatinine has been steadily increasing since 12/29).  BUN 34.  Magnesium 1.3.  Patient bolused with IV fluids.  Review of Systems:   Pt denies any fevers, chills, nausea, vomiting, diarrhea, constipation, abdominal pain, shortness of breath, dyspnea on exertion, orthopnea, cough, wheezing, palpitations, headache, vision changes, lightheadedness, dizziness, melena, rectal bleeding.  Review of systems are otherwise negative  Past Medical History:  Diagnosis Date  . Anginal pain [facility])    sees Dr  Einar Gip.   . Arthritis    rheumatoid ..   . Diabetes mellitus without complication (HCC)    Metformin 2.3.2017  . Diverticulitis   . Diverticulitis of large intestine with perforation and abscess 04/05/2018  . Dysrhythmia   . Fibromyalgia   . GERD (gastroesophageal reflux disease)   . High cholesterol   . Hypertension   . Liver disease 08- 2012   per Dr Dagmar Hait pt has enlarged liver  . Peripheral vascular disease (HCC)    legs  . Pneumonia 06/17/2016  . Sleep apnea    sleep study  oct 2012  . Stroke [facility]) 2011   [facility]     Past Surgical History:  Procedure Laterality Date  . [place]  . CHOLECYSTECTOMY  1996  . COLONOSCOPY N/A 02/13/2018   Procedure: COLONOSCOPY;  Surgeon: Rogene Houston, MD;  [facility];  Service: Endoscopy;  Laterality: N/A;  8:30  . COLOSTOMY    . CORONARY ARTERY BYPASS GRAFT  1986  . ESOPHAGEAL DILATION N/A 01/13/2018   Procedure: ESOPHAGEAL DILATION;  Surgeon: Rogene Houston, MD;  [facility];  Service: Endoscopy;  Laterality: N/A;  . ESOPHAGOGASTRODUODENOSCOPY N/A 01/13/2018   Procedure: ESOPHAGOGASTRODUODENOSCOPY (EGD);  Surgeon: Rogene Houston, MD;  [facility];  Service: Endoscopy;  Laterality: N/A;  . EYE SURGERY  2013   cat ext bilateral .  . EYE SURGERY  2002   laser  . FOREIGN BODY REMOVAL  02/13/2018   Procedure: FOREIGN BODY REMOVAL;  Surgeon: Rogene Houston, MD;  [facility];  Service: Endoscopy;;  foreign body removal which appears  to be food debri in a stem form removed from colon by DR. Rehman  . [place]- 2007   rod right leg - right wrist plate  . GAS/FLUID EXCHANGE  03/25/2012   Procedure: GAS/FLUID EXCHANGE;  Surgeon: Hayden Pedro, MD;  Location: [place];  Service: Ophthalmology;  Laterality: Right;  . LAPAROTOMY N/A 04/30/2018   Procedure: EXPLORATORY LAPAROTOMY  LYSIS OF ADHESIONS SMALL BOWEL RESECTION WITH ANASTOMSIS LOOP ILEOSTOMY  PLACEMENT OF DRAINS AND WOUND [place];  Surgeon: Donnie Mesa, MD;  Location: [place];  Service: General;  Laterality: N/A;  . PARS PLANA VITRECTOMY  03/25/2012   Procedure: PARS PLANA VITRECTOMY WITH 25 GAUGE;  Surgeon: Hayden Pedro, MD;  Location: [place];  Service: Ophthalmology;  Laterality: Right;   Social History:  reports that she quit smoking about 34 years ago. Her smoking use included cigarettes. She has a 1.25 pack-year smoking history. She has never used smokeless tobacco. She reports current alcohol use. She reports that she does not use drugs. Patient lives at home  Allergies  Allergen Reactions  . Lactose Intolerance (Gi) Other (See Comments)    G.I. Upset  . Butrans [Buprenorphine] Rash and Other (See Comments)    Infected skin underneath application  . Simvastatin Rash    Family History  Problem Relation Age of Onset  . Hypertension Daughter   . Rheum arthritis Maternal Grandmother       Prior to Admission medications   Medication Sig Start Date End Date Taking? Authorizing Provider  clopidogrel (PLAVIX) 75 MG tablet Take 1 tablet (75 mg total) by mouth daily with breakfast. 01/14/18  Yes Rehman, Mechele Dawley, MD  diclofenac sodium (VOLTAREN) 1 % GEL Apply 2 g topically 4 (four) times daily as needed (Pain).    Yes [provider]  diltiazem (CARDIZEM CD) 120 MG 24 hr capsule Take 1 capsule (120 mg total) by mouth daily. 05/13/18 05/13/19 Yes Thurnell Lose, MD  ferrous sulfate 325 (65 FE) MG tablet Take 1 tablet (325 mg total) by mouth 2 (two) times daily with a meal. 05/13/18  Yes Thurnell Lose, MD  HYDROcodone-acetaminophen [facility]) 10-325 MG tablet Take 1 tablet by mouth 4 times a day as needed 06/13/18  Yes Lassen, Arlo C, PA-C  insulin aspart (NOVOLOG FLEXPEN) 100 UNIT/ML FlexPen Give per sliding scale 2 times a day, 140-199 - 2 units, 200-250 - 4 units, 251-299 - 6 units,  300-349 - 8 units,  350 or above 10 units. Dispense syringes and needles as needed, Ok to  switch to PEN if approved. Substitute to any brand approved. DX DM2, Code E11.65   Yes [provider]  isosorbide dinitrate (ISORDIL) 5 MG tablet Take 1 tablet (5 mg total) by mouth 3 (three) times daily before meals. 02/07/18  Yes Rehman, Mechele Dawley, MD  leflunomide (ARAVA) 20 MG tablet Take 20 mg by mouth daily.   Yes [provider]  metoprolol succinate (TOPROL-XL) 25 MG 24 hr tablet Take 25 mg by mouth daily.   Yes [provider]  PARoxetine (PAXIL) 20 MG tablet Take 20 mg by mouth daily.   Yes [provider]  potassium chloride SA (K-DUR,KLOR-CON) 20 MEQ tablet Take 20 mEq by mouth daily.   Yes [provider]  pravastatin (PRAVACHOL) 40 MG tablet Take 40 mg by mouth at bedtime.   Yes [provider]  predniSONE (DELTASONE) 5 MG tablet Take 5 mg by mouth daily with breakfast.   Yes [provider]  pregabalin (LYRICA) 50 MG capsule Take 1 capsule (50 mg total) by mouth 3 (three) times daily. 05/26/18  Yes Lassen, Arlo C, PA-C  SLOW-MAG 71.5-119 MG TBEC SR tablet Give 143 mg by mouth three times a day   Yes [provider]  Tafluprost (ZIOPTAN) 0.0015 % SOLN Place 1 drop into both eyes at bedtime.    Yes [provider]  temazepam (RESTORIL) 30 MG capsule Take 1 capsule (30 mg total) by mouth at bedtime. 05/14/18  Yes Lassen, Arlo C, PA-C  timolol (BETIMOL) 0.5 % ophthalmic solution Place 1 drop into both eyes 2 (two) times daily.    Yes [provider]  Tofacitinib Citrate (XELJANZ) 5 MG TABS Take 1 tablet by mouth daily.   Yes [provider]  Vitamin D, Ergocalciferol, (DRISDOL) 1.25 MG (50000 UT) CAPS capsule Take 50,000 Units by mouth every Thursday.    Yes [provider]  warfarin (COUMADIN) 1 MG tablet Take 1.5 mg by mouth every evening.    Yes [provider]    Physical Exam: BP (!) 126/110   Pulse 82   Temp 97.7 F (36.5 C) (Oral)   Resp 10   Ht 5\' 6"  (1.676 m)    Wt 101.6 kg   SpO2 94%   BMI 36.15 kg/m   . General: Elderly Caucasian female. Awake and alert and oriented x3. No acute cardiopulmonary distress.  Marland Kitchen HEENT: Normocephalic atraumatic.  Right and left ears normal in appearance.  Pupils equal, round, reactive to light. Extraocular muscles are intact. Sclerae anicteric and noninjected.  Moist mucosal membranes. No mucosal lesions.  . Neck: Neck supple without lymphadenopathy. No carotid bruits. No masses palpated.  . Cardiovascular: Regular rate with normal S1-S2 sounds. No murmurs, rubs, gallops auscultated. No JVD.  Marland Kitchen Respiratory: Good respiratory effort with no wheezes, rales, rhonchi. Lungs clear to auscultation bilaterally.  No accessory muscle use. . Abdomen: Soft, nontender, nondistended. Active bowel sounds. No masses or hepatosplenomegaly  . Skin: No rashes, lesions, or ulcerations.  Dry, warm to touch. 2+ dorsalis pedis and radial pulses. . Musculoskeletal: No calf or leg pain. All major joints not erythematous nontender.  No upper or lower joint deformation.  Good ROM.  No contractures  . Psychiatric: Intact judgment and insight. Pleasant and cooperative. . Neurologic: No focal neurological deficits.  Patient globally weak.  Strength is 4.5/5 and symmetric in upper and lower extremities.  Cranial nerves II through XII are grossly intact.           Labs on Admission: I have personally reviewed following labs and imaging studies  CBC: Recent Labs  Lab 06/08/18 1220 06/10/18 1314 06/11/18 0650 06/13/18 0756 06/13/18 1719  WBC 7.1 7.6 6.9 7.1 7.8  NEUTROABS 5.3  --  4.6 4.9 6.2  HGB 10.3* 10.3* 10.3* 10.7* 11.2*  HCT 34.9* 35.7* 35.1* 36.3 38.2  MCV 103.9* 105.9* 105.7* 106.8* 104.1*  PLT 377 379 368 339 614   Basic Metabolic Panel: Recent Labs  Lab 06/10/18 0700 06/10/18 1314 06/11/18 0650 06/12/18 0700 06/13/18 0756 06/13/18 1719  NA 138 137 140 139 138 137  K 4.5 5.0 4.5 4.5 5.0 5.5*  CL 111 111 112* 113* 111  109  CO2 19* 15* 19* 18* 18* 18*  GLUCOSE 104* 144* 94 122* 85 145*  BUN 29* 32* 32* 32* 32* 34*  CREATININE 1.60* 1.75* 1.73* 1.92* 2.00* 2.27*  CALCIUM 8.7* 8.7* 8.9 8.8* 9.3 9.0  MG 1.2* 1.4*  --  1.5* 1.3* 1.3*   GFR: Estimated Creatinine Clearance: 26.6 mL/min (A) (by C-G formula based on SCr of 2.27 mg/dL (H)). Liver Function Tests: Recent Labs  Lab 06/13/18 0756 06/13/18 1719  AST 43* 43*  ALT 26 29  ALKPHOS 89 97  BILITOT 0.6 0.5  PROT 5.7* 7.0  ALBUMIN 2.9* 3.3*   No results for input(s): LIPASE, AMYLASE in the last 168 hours. No results for input(s): AMMONIA in the last 168 hours. Coagulation Profile: Recent Labs  Lab 06/10/18 1314 06/11/18 0650 06/12/18 0700 06/13/18 0756 06/13/18 1719  INR 2.30 2.62 2.72 2.81 2.56   Cardiac Enzymes: No results for input(s): CKTOTAL, CKMB, CKMBINDEX, TROPONINI in the last 168 hours. BNP (last 3 results) No results for input(s): PROBNP in the last 8760 hours. HbA1C: No results for input(s): HGBA1C in the last 72 hours. CBG: No results for input(s): GLUCAP in the last 168 hours. Lipid Profile: No results for input(s): CHOL, HDL, LDLCALC, TRIG, CHOLHDL, LDLDIRECT in the last 72 hours. Thyroid Function Tests: No results for input(s): TSH, T4TOTAL, FREET4, T3FREE, THYROIDAB in the last 72 hours. Anemia Panel: No results for input(s): VITAMINB12, FOLATE, FERRITIN, TIBC, IRON, RETICCTPCT in the last 72 hours. Urine analysis:    Component Value Date/Time   COLORURINE YELLOW 06/13/2018 1825   APPEARANCEUR CLEAR 06/13/2018 1825   LABSPEC 1.009 06/13/2018 1825   PHURINE 5.0 06/13/2018 1825   GLUCOSEU NEGATIVE 06/13/2018 1825   HGBUR MODERATE (A) 06/13/2018 1825   BILIRUBINUR NEGATIVE 06/13/2018 1825   KETONESUR NEGATIVE 06/13/2018 1825   PROTEINUR NEGATIVE 06/13/2018 1825   UROBILINOGEN 0.2 06/20/2013 1832   NITRITE NEGATIVE 06/13/2018 1825   LEUKOCYTESUR NEGATIVE 06/13/2018 1825   Sepsis  Labs: @LABRCNTIP (procalcitonin:4,lacticidven:4) )No results found for this or any previous visit (from the past 240 hour(s)).   Radiological Exams on Admission: Ct Abdomen Pelvis Wo Contrast  Result Date: 06/12/2018 CLINICAL DATA:  73 year old who underwent laparotomy 04/30/2018 for perforated sigmoid diverticulitis and multiple intra-abdominal abscesses for which she underwent lysis of adhesions, short segment small-bowel resection and diverting loop ileostomy. She presents now with abdominal pain and fever. EXAM: CT ABDOMEN AND PELVIS WITHOUT CONTRAST TECHNIQUE: Multidetector CT imaging of the abdomen and pelvis was performed following the standard protocol without IV contrast. Oral contrast was administered. COMPARISON:  05/07/2018, 04/27/2018 and earlier. FINDINGS: Lower chest: Mild scarring and bronchiectasis involving the lower lobes. Scarring involving the RIGHT MIDDLE LOBE. No new abnormalities involving the lung bases. Heart mildly enlarged but stable. Mitral annular calcification again noted. Hepatobiliary: Normal unenhanced appearance of the liver. Surgically absent gallbladder. No biliary ductal dilation. Pancreas: Mild diffuse atrophy. Otherwise normal unenhanced appearance. Spleen: Normal unenhanced appearance. Adrenals/Urinary Tract: Normal appearing adrenal glands. Non-obstructing approximate 4 mm calculus in a LOWER pole calyx of the RIGHT kidney. No urinary tract calculi elsewhere. No hydronephrosis. Allowing for the unenhanced technique, no focal parenchymal abnormality involving either kidney. Small diverticulum arising from the RIGHT posterolateral wall of the urinary bladder. Opaque material within the dependent portion of the urinary bladder. Stomach/Bowel: Normal-appearing stomach containing opaque ingested tablets. Normal-appearing small bowel. Small bowel surgical anastomosis in the LEFT mid abdomen at the level of the umbilicus. Normal-appearing ileostomy in the RIGHT mid abdomen.  Contrast material passes through the small bowel into the ostomy bag without obstruction. Decompressed colon with diffuse diverticulosis, including extensive descending and sigmoid colon diverticulosis. No evidence of diverticulitis. Vascular/Lymphatic: Severe aortoiliofemoral atherosclerosis without evidence of aneurysm. No pathologic lymphadenopathy. Reproductive: Normal-appearing uterus and ovaries without evidence of adnexal  mass. Other: Percutaneous catheter within a completely drained abscess in the RIGHT UPPER pelvis. Surgical drain in the pelvis. No evidence of intra-abdominal or pelvic abscess currently. No ascites. Midline surgical incision without evidence of incisional hernia or abscess. Musculoskeletal: Multilevel degenerative disc disease, spondylosis and facet degenerative changes throughout the lumbar spine. Degenerative changes involving both hips. No acute findings. IMPRESSION: 1. No acute abnormalities involving the abdomen or pelvis. 2. No evidence of intra-abdominal or pelvic abscess currently. Percutaneous catheter within a completely drained abscess in the RIGHT UPPER pelvis. 3. Non-obstructing approximate 4 mm calculus in a LOWER pole calyx of the RIGHT kidney. No urinary tract calculi elsewhere. 4. Diffuse colonic diverticulosis, including extensive descending and sigmoid colon diverticulosis. No evidence of diverticulitis. Aortic Atherosclerosis (ICD10-170.0) Electronically Signed   By: Evangeline Dakin M.D.   On: 06/12/2018 11:21    EKG: Independently reviewed.  Her fibrillation with low voltage.  No acute ST changes.  Assessment/Plan: Principal Problem:   AKI (acute kidney injury) ([place]) Active Problems:   TIA (transient ischemic attack)   DM (diabetes mellitus) ([place])   HTN (hypertension)   Obesity, unspecified   Rheumatoid arthritis ([place])   Abscess of sigmoid colon due to diverticulitis   Ileostomy present [facility])   Muscular deconditioning   Hypomagnesemia    This  patient was discussed with the ED physician, including pertinent vitals, physical exam findings, labs, and imaging.  We also discussed care given by the ED provider.  1. AKI a. Observation b. Telemetry monitoring c. IV fluids d. Repeat creatinine in the morning e. [place] secondary to dehydration and poor oral fluid intake 2. Muscular deconditioning a. Secondary to patient being hospitalized and essentially bedbound over the past month b. We will have physical therapy see and evaluate the patient -the patient may need more intensive rehab 3. Abscess sigmoid colon due to diverticulitis a. Wound VAC in place b. Visible margins appear healthy and with good granulation tissue 4. Hypomagnesemia a. Question whether this is related to ileostomy: Contents of the ileostomy bag did not appear like the patient was experiencing high-volume output from the ileostomy. b. Replace magnesium c. Double oral magnesium d. Repeat magnesium in the morning 5. Ileostomy present a. No complications at this time 6. Atrial fibrillation a. Continue with oral calcium channel blocker b. Continue on telemetry at this time c. On coumadin 7. Diabetes a. Home regimen with SSI and CBGs AC and QHS 8. Hypertension a. Contolled. Continue home regimen 9. Obesity 10. History of stroke and TIA a. Continue plavix  DVT prophylaxis: Therapeutic on Coumadin Consultants: Physical therapy and pharmacy Code Status: Full code Family Communication: Husband present during interview and exam Disposition Plan: Pending    [facility] Pager [phone]  If 7PM-7AM, please contact night-coverage [email or web address] Password [email or web address]

## 2018-06-13 NOTE — ED Notes (Signed)
Pt has wound vac to well granulated midline incision.  JP drain going to abscess, colostomy, and  Nephrostomy per Dr. Sabra Heck.

## 2018-06-13 NOTE — ED Notes (Signed)
Emptied ileostomy bag of approximately 150 ml of liquid stool.

## 2018-06-13 NOTE — Progress Notes (Signed)
Location:    [facility] Room Number: 144/P Place of Service:  SNF [phone])  Provider: Granville Lewis PA-C  PCP: [name], MD Patient Care Team: [name], MD as PCP - General (Internal Medicine) Melody Labella, RN as [place] Management  Extended Emergency Contact Information Primary Emergency Contact: Olivos,Robert Address: [address]           [place], [place] [address] Johnnette Litter of [place] Phone: [phone] Mobile Phone: [phone] Relation: Spouse  Code Status: Full Code Goals of care:  Advanced Directive information Advanced Directives 06/13/2018  Does Patient Have a Medical Advance Directive? Yes  Type of Advance Directive (No Data)  Does patient want to make changes to medical advance directive? No - Patient declined  Would patient like information on creating a medical advance directive? No - Patient declined  Pre-existing out of facility DNR order (yellow form or pink MOST form) -     Allergies  Allergen Reactions  . Lactose Intolerance (Gi) Other (See Comments)    G.I. Upset  . Butrans [Buprenorphine] Rash and Other (See Comments)    Infected skin underneath application  . Simvastatin Rash    Chief Complaint  Patient presents with  . Discharge Note    Discharge Visit    HPI:  73 y.o. female seen today for discharge from facility.  Patient has a very complicated medical history including a perforated sigmoid diverticulitis that required surgery and she has had drains placed.  She also has a history of low magnesium as well as rising creatinine-history of suspected atrial fibrillation as well as a recent right upper extremity DVT she is on Coumadin INR is now therapeutic.  She also is a type II diabetic which appears to be well controlled with a sliding scale as needed.  She also has a history of rheumatoid arthritis coronary artery disease fibromyalgia which ihasbeen relatively stable during her  stay here.  She also has been started on Paxil secondary to suspected depression.  She had a CT scan of her abdomen done yesterday and thought to be doing well in this regards with no acute findings-- this was reviewed by surgery who feels that based on the findings of the CT scan she can probably go home.  -surgery did review the abdominal and pelvic x-ray and did not see any areas of concern she does have a renal stone but it is nonobstructing.  It did not show evidence of an intra-abdominal or pelvic abscess currently- percutaneous catheter with incompletely drained abscess in the right upper pelvis- it did show diffuse colonic diverticulosis but no evidence of diverticulitis.  It was negative for hydronephrosis  This has been complicated with a rising creatinine her creatinine today is 2.0 which is relatively baseline with yesterday's level of 1.92.  It appears it has risen over the past week it was actually 1.06 on December 29 but gradually did rise up to 1.38 then into the 1.6 area last few days has been in the 1.7- 2 area  Fluids are being encouraged.  Apparently she got some IV fluids at one time in the ER  She also has had a low magnesium it was as low as 0.7 at one-point although potassium remain normal.  She has received IV magnesium in the ER it appears a couple times.  Magnesium today is 1.3 it was 1.5 yesterday.  She has been started on slow magnesium 3 times a day- this is complicated somewhat  with her renal issues however and will have to be watched closely.  For pain she continues on Norco 10-325 mg every 4 hours as needed this appears to be controlled she does have a history of fibromyalgia she is on Lyrica we have reduced the dose however secondary to her renal issues.  Labs today other than showing the creatinine of 2.0 shows potassium of 5.0 which appears to be rising gradually I suspect this is because of her renal issues and will discontinue this with expedient  follow-up by primary care provider.  She also appears to have macrocytic anemia- hemoglobin has been stable at 10.7- I did discuss this with Dr. Linna Darner it appears folic acid has been somewhat low and will add folic acid 1 mg a day V78 level appeared to be adequate.  In regards to Coumadin management she is therapeutic today at 2.81-this also was assessed by Dr. Linna Darner she has been on 1-1/2 mg a day did receive 3 mg a day earlier this week for a couple days and quickly rose into therapeutic range.  Per Dr. Clayborn Heron recommendation we will give 1 mg tonight and 1-1/2 mg Saturday and Sunday with a recheck on Monday and her primary care provider can do this.  Currently she has no complaints other than weakness and really wants to go home stating it has been a long time since she is been there-she does have a very supportive husband and her son and daughter-in-law are in the room today as well.         Past Medical History:  Diagnosis Date  . Anginal pain [facility])    sees Dr Einar Gip.   . Arthritis    rheumatoid ..   . Diabetes mellitus without complication (HCC)    Metformin 2.3.2017  . Diverticulitis   . Diverticulitis of large intestine with perforation and abscess 04/05/2018  . Dysrhythmia   . Fibromyalgia   . GERD (gastroesophageal reflux disease)   . High cholesterol   . Hypertension   . Liver disease 08- 2012   per Dr Dagmar Hait pt has enlarged liver  . Peripheral vascular disease (HCC)    legs  . Pneumonia 06/17/2016  . Sleep apnea    sleep study  oct 2012  . Stroke [facility]) 2011   [facility]      Past Surgical History:  Procedure Laterality Date  . [place]  . CHOLECYSTECTOMY  1996  . COLONOSCOPY N/A 02/13/2018   Procedure: COLONOSCOPY;  Surgeon: Rogene Houston, MD;  [facility];  Service: Endoscopy;  Laterality: N/A;  8:30  . CORONARY ARTERY BYPASS GRAFT  1986  . ESOPHAGEAL DILATION N/A 01/13/2018   Procedure: ESOPHAGEAL DILATION;   Surgeon: Rogene Houston, MD;  [facility];  Service: Endoscopy;  Laterality: N/A;  . ESOPHAGOGASTRODUODENOSCOPY N/A 01/13/2018   Procedure: ESOPHAGOGASTRODUODENOSCOPY (EGD);  Surgeon: Rogene Houston, MD;  [facility];  Service: Endoscopy;  Laterality: N/A;  . EYE SURGERY  2013   cat ext bilateral .  . EYE SURGERY  2002   laser  . FOREIGN BODY REMOVAL  02/13/2018   Procedure: FOREIGN BODY REMOVAL;  Surgeon: Rogene Houston, MD;  [facility];  Service: Endoscopy;;  foreign body removal which appears to be food debri in a stem form removed from colon by DR. Rehman  . [place]- 2007   rod right leg - right wrist plate  . GAS/FLUID EXCHANGE  03/25/2012   Procedure: GAS/FLUID EXCHANGE;  Surgeon: Hayden Pedro, MD;  Location: [place];  Service: Ophthalmology;  Laterality: Right;  . LAPAROTOMY N/A 04/30/2018   Procedure: EXPLORATORY LAPAROTOMY  LYSIS OF ADHESIONS SMALL BOWEL RESECTION WITH ANASTOMSIS LOOP ILEOSTOMY PLACEMENT OF DRAINS AND WOUND [place];  Surgeon: Donnie Mesa, MD;  Location: [place];  Service: General;  Laterality: N/A;  . PARS PLANA VITRECTOMY  03/25/2012   Procedure: PARS PLANA VITRECTOMY WITH 25 GAUGE;  Surgeon: Hayden Pedro, MD;  Location: [place];  Service: Ophthalmology;  Laterality: Right;      reports that she quit smoking about 34 years ago. Her smoking use included cigarettes. She has a 1.25 pack-year smoking history. She has never used smokeless tobacco. She reports current alcohol use. She reports that she does not use drugs. Social History   Socioeconomic History  . Marital status: Married    Spouse name: Herbie Baltimore  . Number of children: 2  . Years of education: 63  . Highest education level: Not on file  Occupational History  . Occupation: Retired  Scientific laboratory technician  . Financial resource strain: Not on file  . Food insecurity:    Worry: Not on file    Inability: Not on file  . Transportation needs:    Medical: Not on  file    Non-medical: Not on file  Tobacco Use  . Smoking status: Former Smoker    Packs/day: 0.25    Years: 5.00    Pack years: 1.25    Types: Cigarettes    Last attempt to quit: 06/11/1984    Years since quitting: 34.0  . Smokeless tobacco: Never Used  Substance and Sexual Activity  . Alcohol use: Yes    Comment: occ  . Drug use: No    Types: Hydrocodone  . Sexual activity: Yes  Lifestyle  . Physical activity:    Days per week: Not on file    Minutes per session: Not on file  . Stress: Not on file  Relationships  . Social connections:    Talks on phone: Not on file    Gets together: Not on file    Attends religious service: Not on file    Active member of club or organization: Not on file    Attends meetings of clubs or organizations: Not on file    Relationship status: Not on file  . Intimate partner violence:    Fear of current or ex partner: Not on file    Emotionally abused: Not on file    Physically abused: Not on file    Forced sexual activity: Not on file  Other Topics Concern  . Not on file  Social History Narrative  . Not on file   Functional Status Survey:    Allergies  Allergen Reactions  . Lactose Intolerance (Gi) Other (See Comments)    G.I. Upset  . Butrans [Buprenorphine] Rash and Other (See Comments)    Infected skin underneath application  . Simvastatin Rash    Pertinent  Health Maintenance Due  Topic Date Due  . FOOT EXAM  06/14/2018 (Originally 08/14/1955)  . MAMMOGRAM  06/14/2018 (Originally 11/17/2016)  . OPHTHALMOLOGY EXAM  06/14/2018 (Originally 08/14/1955)  . URINE MICROALBUMIN  06/14/2018 (Originally 08/14/1955)  . DEXA SCAN  06/14/2018 (Originally 08/13/2009)  . PNA vac Low Risk Adult (1 of 2 - PCV13) 06/14/2018 (Originally 08/13/2009)  . HEMOGLOBIN A1C  10/07/2018  . COLONOSCOPY  02/14/2028  . INFLUENZA VACCINE  Completed    Medications:  Outpatient Encounter Medications as of 06/13/2018  Medication Sig  . clopidogrel (PLAVIX) 75 MG  tablet Take 1 tablet (75 mg total) by mouth daily with breakfast.  . diclofenac sodium (VOLTAREN) 1 % GEL Apply 2 g topically 4 (four) times daily as needed (Pain).   Marland Kitchen diltiazem (CARDIZEM CD) 120 MG 24 hr capsule Take 1 capsule (120 mg total) by mouth daily.  . ferrous sulfate 325 (65 FE) MG tablet Take 1 tablet (325 mg total) by mouth 2 (two) times daily with a meal.  . HYDROcodone-acetaminophen (NORCO) 10-325 MG tablet Take 1 tablet by mouth 4 (four) times daily.  . insulin aspart (NOVOLOG FLEXPEN) 100 UNIT/ML FlexPen Give per sliding scale 2 times a day, 140-199 - 2 units, 200-250 - 4 units, 251-299 - 6 units,  300-349 - 8 units,  350 or above 10 units. Dispense syringes and needles as needed, Ok to switch to PEN if approved. Substitute to any brand approved. DX DM2, Code E11.65  . isosorbide dinitrate (ISORDIL) 5 MG tablet Take 1 tablet (5 mg total) by mouth 3 (three) times daily before meals.  Marland Kitchen leflunomide (ARAVA) 20 MG tablet Take 20 mg by mouth daily.  . metoprolol succinate (TOPROL-XL) 25 MG 24 hr tablet Take 25 mg by mouth daily.  Marland Kitchen PARoxetine (PAXIL) 20 MG tablet Take 20 mg by mouth daily.  . potassium chloride SA (K-DUR,KLOR-CON) 20 MEQ tablet Take 20 mEq by mouth daily.  . pravastatin (PRAVACHOL) 40 MG tablet Take 40 mg by mouth at bedtime.  . predniSONE (DELTASONE) 5 MG tablet Take 5 mg by mouth daily with breakfast.  . pregabalin (LYRICA) 50 MG capsule Take 1 capsule (50 mg total) by mouth 3 (three) times daily.  Marland Kitchen SLOW-MAG 71.5-119 MG TBEC SR tablet Give 143 mg by mouth three times a day  . Tafluprost (ZIOPTAN) 0.0015 % SOLN Place 1 drop into both eyes at bedtime.   . temazepam (RESTORIL) 30 MG capsule Take 1 capsule (30 mg total) by mouth at bedtime.  . timolol (BETIMOL) 0.5 % ophthalmic solution Place 1 drop into both eyes 2 (two) times daily.   . Tofacitinib Citrate (XELJANZ) 5 MG TABS Take 1 tablet by mouth daily.  . Vitamin D, Ergocalciferol, (DRISDOL) 1.25 MG (50000 UT) CAPS  capsule Take 50,000 Units by mouth every Thursday.   . warfarin (COUMADIN) 1 MG tablet Take 1.5 mg by mouth every evening.    No facility-administered encounter medications on file as of 06/13/2018.      Review of Systems  In general she not complaining of fever chills appears she is lost some weight during her stay here although appears to be stabilized over the last few days.  Skin she is not complaining of rashes or itching has a wound VAC over her abdominal wound and abdominal drains.  Head ears eyes nose mouth and throat is not complaining of a sore throat or visual changes.  Respiratory is not complaining of shortness of breath or cough.  Cardiac does not complain of chest pain has fairly mild lower extremity edema at baseline.  GI is not complaining of abdominal discomfort- she does have an extensive history with drains and wound VAC placement but at this point is not complaining of discomfort.  GU is not complaining of dysuria.  Musculoskeletal does complain of weakness especially lower extremities but is not complaining of acute pain appears Norco is effective with helping.  Neurologic does not complain of dizziness headache numbness or syncope does have  weakness.  And psych is looking forward to going home she was thought to be depressed earlier in her stay and was started on Paxil.     Temperature is 97 pulse was 96-respirations 20 blood pressure is 118/68   Physical Exam  In general this is a pleasant somewhat obese elderly female in no distress   Her skin is warm and dry she does have drains placed in her abdomen with dressing- as well as a wound VAC in place-and an ileostomy bag   Eyes visual acuity appears to be intact sclera and conjunctive are clear.  Oropharynx is clear mucous membranes moist.  Chest is clear to auscultation there is no labored breathing air entry is slightly shallow.  Heart is irregular irregular rate and rhythm in the 90s- it has varied  from the 80s to slightly above 100 ouring her stay here  She has mild lower extremity edema appears relatively baseline.  Her abdomen is somewhat obese soft does not appear to be tender again she does have the drains ileostomy and wound VAC in place.  Musculoskeletal continues to have weakness lower extremities but appears to be at baseline --she does transfer but benefits from assistance.  Neurologic is grossly intact her speech is clear cannot really appreciate lateralizing findings she does have weakness.  Psych she is alert and oriented pleasant and appropriate    Labs reviewed: Basic Metabolic Panel: Recent Labs    05/06/18 0406 05/07/18 0348  05/09/18 0429  06/10/18 1314 06/11/18 0650 06/12/18 0700 06/13/18 0756  NA 143 141   < > 136   < > 137 140 139 138  K 4.0 3.9   < > 3.3*   < > 5.0 4.5 4.5 5.0  CL 102 101   < > 101   < > 111 112* 113* 111  CO2 31 30   < > 28   < > 15* 19* 18* 18*  GLUCOSE 113* 121*   < > 90   < > 144* 94 122* 85  BUN 50* 48*   < > 32*   < > 32* 32* 32* 32*  CREATININE 1.14* 1.12*   < > 1.00   < > 1.75* 1.73* 1.92* 2.00*  CALCIUM 8.1* 7.9*   < > 7.8*   < > 8.7* 8.9 8.8* 9.3  MG 1.8 1.5*   < > 1.8   < > 1.4*  --  1.5* 1.3*  PHOS 5.7* 4.7*  --  3.6  --   --   --   --   --    < > = values in this interval not displayed.   Liver Function Tests: Recent Labs    05/07/18 0348 05/13/18 0412 06/13/18 0756  AST 44* 22 43*  ALT 42 16 26  ALKPHOS 101 81 89  BILITOT 0.6 0.6 0.6  PROT 5.0* 4.9* 5.7*  ALBUMIN 1.8* 1.6* 2.9*   No results for input(s): LIPASE, AMYLASE in the last 8760 hours. No results for input(s): AMMONIA in the last 8760 hours. CBC: Recent Labs    06/08/18 1220 06/10/18 1314 06/11/18 0650 06/13/18 0756  WBC 7.1 7.6 6.9 7.1  NEUTROABS 5.3  --  4.6 4.9  HGB 10.3* 10.3* 10.3* 10.7*  HCT 34.9* 35.7* 35.1* 36.3  MCV 103.9* 105.9* 105.7* 106.8*  PLT 377 379 368 339   Cardiac Enzymes: Recent Labs    04/09/18 2139  04/10/18 0351 04/10/18 0945  TROPONINI 0.07* 0.08* 0.06*   BNP: Invalid input(s): POCBNP  CBG: Recent Labs    05/12/18 2213 05/13/18 0755 05/13/18 1230  GLUCAP 94 94 120*    Procedures and Imaging Studies During Stay: Ct Abdomen Pelvis Wo Contrast  Result Date: 06/12/2018 CLINICAL DATA:  73 year old who underwent laparotomy 04/30/2018 for perforated sigmoid diverticulitis and multiple intra-abdominal abscesses for which she underwent lysis of adhesions, short segment small-bowel resection and diverting loop ileostomy. She presents now with abdominal pain and fever. EXAM: CT ABDOMEN AND PELVIS WITHOUT CONTRAST TECHNIQUE: Multidetector CT imaging of the abdomen and pelvis was performed following the standard protocol without IV contrast. Oral contrast was administered. COMPARISON:  05/07/2018, 04/27/2018 and earlier. FINDINGS: Lower chest: Mild scarring and bronchiectasis involving the lower lobes. Scarring involving the RIGHT MIDDLE LOBE. No new abnormalities involving the lung bases. Heart mildly enlarged but stable. Mitral annular calcification again noted. Hepatobiliary: Normal unenhanced appearance of the liver. Surgically absent gallbladder. No biliary ductal dilation. Pancreas: Mild diffuse atrophy. Otherwise normal unenhanced appearance. Spleen: Normal unenhanced appearance. Adrenals/Urinary Tract: Normal appearing adrenal glands. Non-obstructing approximate 4 mm calculus in a LOWER pole calyx of the RIGHT kidney. No urinary tract calculi elsewhere. No hydronephrosis. Allowing for the unenhanced technique, no focal parenchymal abnormality involving either kidney. Small diverticulum arising from the RIGHT posterolateral wall of the urinary bladder. Opaque material within the dependent portion of the urinary bladder. Stomach/Bowel: Normal-appearing stomach containing opaque ingested tablets. Normal-appearing small bowel. Small bowel surgical anastomosis in the LEFT mid abdomen at the level of  the umbilicus. Normal-appearing ileostomy in the RIGHT mid abdomen. Contrast material passes through the small bowel into the ostomy bag without obstruction. Decompressed colon with diffuse diverticulosis, including extensive descending and sigmoid colon diverticulosis. No evidence of diverticulitis. Vascular/Lymphatic: Severe aortoiliofemoral atherosclerosis without evidence of aneurysm. No pathologic lymphadenopathy. Reproductive: Normal-appearing uterus and ovaries without evidence of adnexal mass. Other: Percutaneous catheter within a completely drained abscess in the RIGHT UPPER pelvis. Surgical drain in the pelvis. No evidence of intra-abdominal or pelvic abscess currently. No ascites. Midline surgical incision without evidence of incisional hernia or abscess. Musculoskeletal: Multilevel degenerative disc disease, spondylosis and facet degenerative changes throughout the lumbar spine. Degenerative changes involving both hips. No acute findings. IMPRESSION: 1. No acute abnormalities involving the abdomen or pelvis. 2. No evidence of intra-abdominal or pelvic abscess currently. Percutaneous catheter within a completely drained abscess in the RIGHT UPPER pelvis. 3. Non-obstructing approximate 4 mm calculus in a LOWER pole calyx of the RIGHT kidney. No urinary tract calculi elsewhere. 4. Diffuse colonic diverticulosis, including extensive descending and sigmoid colon diverticulosis. No evidence of diverticulitis. Aortic Atherosclerosis (ICD10-170.0) Electronically Signed   By: Evangeline Dakin M.D.   On: 06/12/2018 11:21   Ct Head Wo Contrast  Result Date: 06/08/2018 CLINICAL DATA:  Patient status post fall. No reported loss of consciousness. EXAM: CT HEAD WITHOUT CONTRAST CT CERVICAL SPINE WITHOUT CONTRAST TECHNIQUE: Multidetector CT imaging of the head and cervical spine was performed following the standard protocol without intravenous contrast. Multiplanar CT image reconstructions of the cervical spine  were also generated. COMPARISON:  Cervical spine CT 06/27/2016 FINDINGS: CT HEAD FINDINGS Brain: Ventricles and sulci are prominent compatible with atrophy. Periventricular and subcortical white matter hypodensity compatible with chronic microvascular ischemic changes. No evidence for acute cortically based infarct, intracranial hemorrhage, mass lesion or mass-effect. Basal ganglia calcifications. Vascular: Unremarkable Skull: Intact. Sinuses/Orbits: Paranasal sinuses are well aerated. Mastoid air cells are unremarkable. Orbits are unremarkable. Other: None. CT CERVICAL SPINE FINDINGS Alignment: Normal anatomic alignment. Skull base and vertebrae: No acute  fracture. No primary bone lesion or focal pathologic process. Soft tissues and spinal canal: No prevertebral fluid or swelling. No visible canal hematoma. Disc levels: Multilevel degenerative disc disease most pronounced C5-6 and C6-7. no acute fracture. Upper chest: Unremarkable. Other: Bilateral carotid arterial vascular calcifications. IMPRESSION: No acute intracranial process. No acute cervical spine fracture. Electronically Signed   By: Lovey Newcomer M.D.   On: 06/08/2018 13:41   Ct Cervical Spine Wo Contrast  Result Date: 06/08/2018 CLINICAL DATA:  Patient status post fall. No reported loss of consciousness. EXAM: CT HEAD WITHOUT CONTRAST CT CERVICAL SPINE WITHOUT CONTRAST TECHNIQUE: Multidetector CT imaging of the head and cervical spine was performed following the standard protocol without intravenous contrast. Multiplanar CT image reconstructions of the cervical spine were also generated. COMPARISON:  Cervical spine CT 06/27/2016 FINDINGS: CT HEAD FINDINGS Brain: Ventricles and sulci are prominent compatible with atrophy. Periventricular and subcortical white matter hypodensity compatible with chronic microvascular ischemic changes. No evidence for acute cortically based infarct, intracranial hemorrhage, mass lesion or mass-effect. Basal ganglia  calcifications. Vascular: Unremarkable Skull: Intact. Sinuses/Orbits: Paranasal sinuses are well aerated. Mastoid air cells are unremarkable. Orbits are unremarkable. Other: None. CT CERVICAL SPINE FINDINGS Alignment: Normal anatomic alignment. Skull base and vertebrae: No acute fracture. No primary bone lesion or focal pathologic process. Soft tissues and spinal canal: No prevertebral fluid or swelling. No visible canal hematoma. Disc levels: Multilevel degenerative disc disease most pronounced C5-6 and C6-7. no acute fracture. Upper chest: Unremarkable. Other: Bilateral carotid arterial vascular calcifications. IMPRESSION: No acute intracranial process. No acute cervical spine fracture. Electronically Signed   By: Lovey Newcomer M.D.   On: 06/08/2018 13:41   Dg Chest Port 1 View  Result Date: 06/10/2018 CLINICAL DATA:  PICC placement. EXAM: PORTABLE CHEST 1 VIEW COMPARISON:  04/11/2018 FINDINGS: PICC tip is in the superior vena cava at the level of the azygos vein. Heart size and vascularity are normal and the lungs are clear. CABG. No acute bone abnormality. IMPRESSION: PICC in good position. No acute cardiopulmonary findings. Electronically Signed   By: Lorriane Shire M.D.   On: 06/10/2018 17:51   Dg Chest Port 1v Same Day  Result Date: 06/10/2018 CLINICAL DATA:  PICC placement. EXAM: PORTABLE CHEST 1 VIEW 6:15 p.m. COMPARISON:  06/10/2018 at 5:24 p.m. FINDINGS: PICC tip remains unchanged in position in the superior vena cava at the level of the azygos vein. Heart size and vascularity are normal. Lungs are clear. CABG. IMPRESSION: No change in the PICC tip in the superior vena cava. No acute cardiopulmonary findings. Electronically Signed   By: Lorriane Shire M.D.   On: 06/10/2018 18:37   Dg Humerus Left  Result Date: 06/10/2018 CLINICAL DATA:  PICC line placement EXAM: LEFT HUMERUS - 2+ VIEW COMPARISON:  None. FINDINGS: There is a PICC line identified in the upper left arm in the expected course.  The distal portion of the PICC line is not included on film. IMPRESSION: There is a PICC line identified in the upper left arm in the expected course. The distal portion of the PICC line is not included on film. Electronically Signed   By: Abelardo Diesel M.D.   On: 06/10/2018 18:02   Ir Picc Replacement Leftinc Imgguide  Result Date: 05/19/2018 INDICATION: Malfunctioning PICC line. Diverticular abscess. Need to continue IV antibiotics. EXAM: FLUOROSCOPIC GUIDED PICC LINE EXCHANGE TECHNIQUE: The procedure, risks, benefits, and alternatives were explained to the patient and informed written consent was obtained. The left upper extremity and external  portion of the existing PICC line was prepped with chlorhexidine in a sterile fashion, and a sterile drape was applied covering the operative field. Maximum barrier sterile technique with sterile gowns and gloves were used for the procedure. A timeout was performed prior to the initiation of the procedure. Local anesthesia was provided with 1% lidocaine. The existing PICC line was cannulated with an 0.0018 wire which was advanced through the catheter. The catheter was exchanged for a peel-away sheath, ultimately allowing advancement of a 46-cm, 5- [place], double lumen PICC line to the level of the superior caval atrial junction. A post procedure spot fluoroscopic image was obtained. The catheter easily aspirated and flushed and was secured in place. A dressing was placed. The patient tolerated the procedure well without immediate post procedural complication. FINDINGS: After catheter exchange, the tip lies within the superior cavoatrial junction. The catheter aspirates and flushes normally, it was capped, and is ready for immediate use. IMPRESSION: Successful fluoroscopic guided exchange of left upper extremity approach 46 cm, 5 - [place], double lumen PICC with tip overlying the superior caval atrial junction. The PICC line is ready for immediate use. Read by: Gareth Eagle, PA-C Electronically Signed   By: Markus Daft M.D.   On: 05/19/2018 13:50    Assessment/Plan:    #1 history of perforated sigmoid diverticulitis with drains placed-CT scan as noted above was reassuring- she will need follow-up by surgery-.  She continues to have significant weakness and will need PT and OT as well as home health support to help with her numerous issues including the wound VAC-she would really like to go home but she is going to really need expedient follow-up.  Her primary care provider's office has been notified and they will see her on Monday, January 6.   #2 history of rising creatinine this continues to be a challenge as well creatinine of 2 today is relatively baseline with yesterday's value this appears to have stabilized somewhat but still is an area of concern.--We have discontinued her potassium for now secondary to a level at 5.0 today  She will see her primary care provider on Monday and most likely will need repeat labs. And a nephrology consult  In meantime encourage fluids.  Her Lyrica dose has been reduced as well.  3.  History of low magnesium this continues to be a challenge as well magnesium is 1.3 today she is on slow magnesium however this is complicated somewhat with her renal issues- this was discussed with Dr. Linna Darner as well and at this point will continue this low magnesium but will need expedient primary care provider follow-up on Monday.--- Again most likely would benefit from a nephrology consult  4 history of right upper extremity DVT she continues on Coumadin INR is therapeutic today will give 1 mg tonight and order 1.5 mg on Saturday and Sunday with follow-up by primary care provider on Monday for any dose adjustments.  5.  History of suspected A. fib- at this point rate appears relatively well controlled on diltiazem and Lopressor pulses range from the 80s to slightly above 100- this will need follow-up by primary care provider as well-and  again she is on Coumadin.  6.  History of type 2 diabetes she is only on sliding scale and this appears stable blood sugars largely have been in the lower 100s.  7.-History of rheumatoid arthritis she continues on prednisone as well as Leflunomide addition to [place] would benefit from follow-up by rheumatology.  8.  History  of fibromyalgia she is on Lyrica again we have reduced the dose down to 25 mg 3 times daily because of her renal issues.  9.  History of anemia with an element of chronic disease it does have a macrocytic presentation will start her on folic acid 1 mg a day S28 level appeared to be adequate-this will need follow-up by primary care provider hemoglobin was 10.7 stable this morning.--She does continue on iron.    10.  History of coronary artery disease she is on Plavix as well as Lopressor and a statin as well as isosorbide dinitrate.  This appears stable she has not really complained of chest pain or shortness of breath  11- history of insomnia she continues on Restoril at night appears to tolerate this.  12.  History of depression she has been started on Paxil as noted above.  Again she will be going home she will need extensive support continues to be quite weak and has numerous issues as noted above there are concerns with her low magnesium and rising creatinine and this will need expedient follow-up she will see her primary care provider on Monday- nursing staff has spoken with the primary care provider's office and notified them of our concerns and need for follow-up.  Again we will discontinue her potassium for now we will give her Coumadin 1 mg today and then 1.5 mg Saturday and Sunday- will start her on folic acid- and she will need most likely a nephrology consult which can be arranged by primary care provider.  Currently she has no complaints other than weakness but she is a very fragile individual.  I did express to patient and her husband that if she  appears to be declining or has any change in status they will need to go to the ER promptly.  They did express understanding.   This plan was discussed with Dr. Linna Darner who is in the facility as well  [phone] note greater than 1 hour spent on this discharge summary- greater than 50% of time spent coordinating a plan of care for numerous diagnoses

## 2018-06-13 NOTE — ED Triage Notes (Signed)
EMS reports HR 146, afib, bp 137/84 95% on room air and cbg 184

## 2018-06-13 NOTE — ED Provider Notes (Signed)
[facility] EMERGENCY DEPARTMENT Provider Note   CSN: [id number] Arrival date & time: 06/13/18  1647     History   Chief Complaint Chief Complaint  Patient presents with  . Weakness    HPI JANNAE Braun is a 73 y.o. female.  HPI  The patient is a very complicated 73 year old female who has a known history of diabetes and unfortunately developed diverticulitis of her large intestine with a perforation and an abscess which formed.  She was admitted to the hospital on October 26 and discharged on December 3 during which time she had multiple interventions including the following   3 percutaneous drains (2 of which remain).  Exploratory laparotomy with lysis of adhesions, small bowel resection and a loop ileostomy  Placement of a wound VAC on April 30, 2018  The patient continues to have drainage from a Jackson-Pratt drain which is coming from a left lower abdominal drain, she has a right nephrostomy tube and a diverting ostomy on the right side.  Her midline incision has been healing with a wound VAC and is doing well.  The patient had been at the [facility] in a nursing facility for a month after discharged and was sent home today.  Reportedly from my review of the medical documents in this patient's chart she has had some paroxysmal atrial fibrillation as well as a blood clot which required Coumadin and had an acute kidney injury when she was in the hospital.  She has a known history of chronic anemia from chronic illness.  She presents today from home after she was found to be generally weak and unable to carry her self.  Reportedly she had difficulty even getting from a wheelchair into the house but because of severe weakness and the fact that the patient was found in atrial fibrillation when the paramedics arrived they transported her to the hospital.  The patient denies pain, denies shortness of breath, denies fevers chills nausea or vomiting.  She reports that she is just  severely weak and has had diplopia for the last month.  I do not know that she has told anybody about this diplopia but review of the CT scan from several days ago showed no findings which would correlate with diplopia.  Past Medical History:  Diagnosis Date  . Anginal pain [facility])    sees Dr Einar Gip.   . Arthritis    rheumatoid ..   . Diabetes mellitus without complication (HCC)    Metformin 2.3.2017  . Diverticulitis   . Diverticulitis of large intestine with perforation and abscess 04/05/2018  . Dysrhythmia   . Fibromyalgia   . GERD (gastroesophageal reflux disease)   . High cholesterol   . Hypertension   . Liver disease 08- 2012   per Dr Dagmar Hait pt has enlarged liver  . Peripheral vascular disease (HCC)    legs  . Pneumonia 06/17/2016  . Sleep apnea    sleep study  oct 2012  . Stroke [facility]) 2011   [facility] [place]      Patient Active Problem List   Diagnosis Date Noted  . AKI (acute kidney injury) ([place]) 06/13/2018  . Abscess of sigmoid colon due to diverticulitis   . Acute renal failure superimposed on stage 3 chronic kidney disease ([place]) 04/08/2018  . CKD (chronic kidney disease) stage 3, GFR 30-59 ml/min (HCC) 04/06/2018  . Fibromyalgia 04/06/2018  . Diverticulitis of large intestine with perforation and abscess 04/05/2018  . Anemia 04/05/2018  . Esophageal dysphagia 01/03/2018  .  Rheumatoid arthritis ([place]) 06/27/2016  . Carpal tunnel syndrome 03/24/2014  . Paresthesias/numbness 03/24/2014  . Obesity, unspecified 09/23/2013  . Pulmonary nodules 07/03/2013  . TIA (transient ischemic attack) 06/20/2013  . Left arm weakness 06/20/2013  . DM (diabetes mellitus) ([place]) 06/20/2013  . HTN (hypertension) 06/20/2013  . Chronic pain 06/20/2013  . Vitreous hemorrhage ([place]) 03/25/2012    Past Surgical History:  Procedure Laterality Date  . [place]  . CHOLECYSTECTOMY  1996  . COLONOSCOPY N/A 02/13/2018   Procedure: COLONOSCOPY;  Surgeon: Rogene Houston, MD;  [facility];  Service: Endoscopy;  Laterality: N/A;  8:30  . COLOSTOMY    . CORONARY ARTERY BYPASS GRAFT  1986  . ESOPHAGEAL DILATION N/A 01/13/2018   Procedure: ESOPHAGEAL DILATION;  Surgeon: Rogene Houston, MD;  [facility];  Service: Endoscopy;  Laterality: N/A;  . ESOPHAGOGASTRODUODENOSCOPY N/A 01/13/2018   Procedure: ESOPHAGOGASTRODUODENOSCOPY (EGD);  Surgeon: Rogene Houston, MD;  [facility];  Service: Endoscopy;  Laterality: N/A;  . EYE SURGERY  2013   cat ext bilateral .  . EYE SURGERY  2002   laser  . FOREIGN BODY REMOVAL  02/13/2018   Procedure: FOREIGN BODY REMOVAL;  Surgeon: Rogene Houston, MD;  [facility];  Service: Endoscopy;;  foreign body removal which appears to be food debri in a stem form removed from colon by DR. Rehman  . [place]- 2007   rod right leg - right wrist plate  . GAS/FLUID EXCHANGE  03/25/2012   Procedure: GAS/FLUID EXCHANGE;  Surgeon: Hayden Pedro, MD;  Location: [place];  Service: Ophthalmology;  Laterality: Right;  . LAPAROTOMY N/A 04/30/2018   Procedure: EXPLORATORY LAPAROTOMY  LYSIS OF ADHESIONS SMALL BOWEL RESECTION WITH ANASTOMSIS LOOP ILEOSTOMY PLACEMENT OF DRAINS AND WOUND [place];  Surgeon: Donnie Mesa, MD;  Location: [place];  Service: General;  Laterality: N/A;  . PARS PLANA VITRECTOMY  03/25/2012   Procedure: PARS PLANA VITRECTOMY WITH 25 GAUGE;  Surgeon: Hayden Pedro, MD;  Location: [place];  Service: Ophthalmology;  Laterality: Right;     OB History   No obstetric history on file.      Home Medications    Prior to Admission medications   Medication Sig Start Date End Date Taking? Authorizing Provider  clopidogrel (PLAVIX) 75 MG tablet Take 1 tablet (75 mg total) by mouth daily with breakfast. 01/14/18   Rehman, Mechele Dawley, MD  diclofenac sodium (VOLTAREN) 1 % GEL Apply 2 g topically 4 (four) times daily as needed (Pain).     [provider]  diltiazem  (CARDIZEM CD) 120 MG 24 hr capsule Take 1 capsule (120 mg total) by mouth daily. 05/13/18 05/13/19  Thurnell Lose, MD  ferrous sulfate 325 (65 FE) MG tablet Take 1 tablet (325 mg total) by mouth 2 (two) times daily with a meal. 05/13/18   Thurnell Lose, MD  HYDROcodone-acetaminophen [facility]) 10-325 MG tablet Take 1 tablet by mouth 4 times a day as needed 06/13/18   Granville Lewis C, PA-C  insulin aspart (NOVOLOG FLEXPEN) 100 UNIT/ML FlexPen Give per sliding scale 2 times a day, 140-199 - 2 units, 200-250 - 4 units, 251-299 - 6 units,  300-349 - 8 units,  350 or above 10 units. Dispense syringes and needles as needed, Ok to switch to PEN if approved. Substitute to any brand approved. DX DM2, Code E11.65    [provider]  isosorbide dinitrate (ISORDIL) 5 MG tablet Take 1 tablet (5 mg total) by mouth 3 (three) times daily before meals. 02/07/18   Rehman, Mechele Dawley, MD  leflunomide (ARAVA) 20 MG tablet Take 20 mg by mouth daily.    [provider]  metoprolol succinate (TOPROL-XL) 25 MG 24 hr tablet Take 25 mg by mouth daily.    [provider]  PARoxetine (PAXIL) 20 MG tablet Take 20 mg by mouth daily.    [provider]  potassium chloride SA (K-DUR,KLOR-CON) 20 MEQ tablet Take 20 mEq by mouth daily.    [provider]  pravastatin (PRAVACHOL) 40 MG tablet Take 40 mg by mouth at bedtime.    [provider]  predniSONE (DELTASONE) 5 MG tablet Take 5 mg by mouth daily with breakfast.    [provider]  pregabalin (LYRICA) 50 MG capsule Take 1 capsule (50 mg total) by mouth 3 (three) times daily. 05/26/18   Oscar La, Arlo C, PA-C  SLOW-MAG 71.5-119 MG TBEC SR tablet Give 143 mg by mouth three times a day    [provider]  Tafluprost (ZIOPTAN) 0.0015 % SOLN Place 1 drop into both eyes at bedtime.     [provider]  temazepam (RESTORIL) 30 MG capsule Take 1 capsule (30 mg total) by mouth at bedtime. 05/14/18   Granville Lewis C,  PA-C  timolol (BETIMOL) 0.5 % ophthalmic solution Place 1 drop into both eyes 2 (two) times daily.     [provider]  Tofacitinib Citrate (XELJANZ) 5 MG TABS Take 1 tablet by mouth daily.    [provider]  Vitamin D, Ergocalciferol, (DRISDOL) 1.25 MG (50000 UT) CAPS capsule Take 50,000 Units by mouth every Thursday.     [provider]  warfarin (COUMADIN) 1 MG tablet Take 1.5 mg by mouth every evening.     [provider]    Family History Family History  Problem Relation Age of Onset  . Hypertension Daughter   . Rheum arthritis Maternal Grandmother     Social History Social History   Tobacco Use  . Smoking status: Former Smoker    Packs/day: 0.25    Years: 5.00    Pack years: 1.25    Types: Cigarettes    Last attempt to quit: 06/11/1984    Years since quitting: 34.0  . Smokeless tobacco: Never Used  Substance Use Topics  . Alcohol use: Yes    Comment: occ  . Drug use: No    Types: Hydrocodone     Allergies   Lactose intolerance (gi); Butrans [buprenorphine]; and Simvastatin   Review of Systems Review of Systems  All other systems reviewed and are negative.    Physical Exam Updated Vital Signs BP 117/74   Pulse 91   Temp 97.7 F (36.5 C) (Oral)   Resp 14   Ht 1.676 m (5\' 6" )   Wt 101.6 kg   SpO2 92%   BMI 36.15 kg/m   Physical Exam Vitals signs and nursing note reviewed.  Constitutional:      General: She is not in acute distress.    Appearance: She is well-developed.  HENT:     Head: Normocephalic and atraumatic.     Comments: MM normal appearing    Mouth/Throat:     Pharynx: No oropharyngeal exudate.  Eyes:     General: No scleral icterus.       Right eye: No discharge.        Left eye: No discharge.  Conjunctiva/sclera: Conjunctivae normal.     Pupils: Pupils are equal, round, and reactive to light.     Comments: She has a normal conjugate gaze with normal pupils and extraocular movements  Neck:      Musculoskeletal: Normal range of motion and neck supple.     Thyroid: No thyromegaly.     Vascular: No JVD.  Cardiovascular:     Rate and Rhythm: Tachycardia present. Rhythm irregular.     Heart sounds: Normal heart sounds. No murmur. No friction rub. No gallop.   Pulmonary:     Effort: Pulmonary effort is normal. No respiratory distress.     Breath sounds: Normal breath sounds. No wheezing or rales.  Abdominal:     General: Bowel sounds are normal. There is no distension.     Palpations: Abdomen is soft. There is no mass.     Tenderness: There is no abdominal tenderness.     Comments: The abdomen has multiple drains and a midline ventral wall defect which is currently under vacuum seal and appears without any surrounding erythema.  She has no tenderness to palpation, there is a purulent type material coming out of the left lower quadrant drain, she states that is what it always looks like.  Musculoskeletal: Normal range of motion.        General: No tenderness.  Lymphadenopathy:     Cervical: No cervical adenopathy.  Skin:    General: Skin is warm and dry.     Findings: No erythema or rash.  Neurological:     Mental Status: She is alert.     Coordination: Coordination normal.     Comments: The patient is generally weak, she is unable to move from the EMS stretcher to the ER cot without assistance however she is able to move both legs and both arms, she has 4-1/2 out of 5 strength diffusely, though she is diffusely weak she is not focal.  Cranial nerves III through XII appear normal.  She does have diplopia but no obvious disconjugate gaze.  There is no facial droop, clear speech, normal memory.  Psychiatric:        Behavior: Behavior normal.      ED Treatments / Results  Labs (all labs ordered are listed, but only abnormal results are displayed) Labs Reviewed  COMPREHENSIVE METABOLIC PANEL - Abnormal; Notable for the following components:      Result Value   Potassium 5.5 (*)      CO2 18 (*)    Glucose, Bld 145 (*)    BUN 34 (*)    Creatinine, Ser 2.27 (*)    Albumin 3.3 (*)    AST 43 (*)    GFR calc non Af Amer 21 (*)    GFR calc Af Amer 24 (*)    All other components within normal limits  CBC WITH DIFFERENTIAL/PLATELET - Abnormal; Notable for the following components:   RBC 3.67 (*)    Hemoglobin 11.2 (*)    MCV 104.1 (*)    MCHC 29.3 (*)    RDW 19.4 (*)    nRBC 0.3 (*)    Lymphs Abs 0.4 (*)    All other components within normal limits  PROTIME-INR - Abnormal; Notable for the following components:   Prothrombin Time 27.2 (*)    All other components within normal limits  MAGNESIUM - Abnormal; Notable for the following components:   Magnesium 1.3 (*)    All other components within normal limits  URINE CULTURE  ETHANOL  URINALYSIS, ROUTINE W REFLEX MICROSCOPIC    EKG EKG Interpretation  Date/Time:  Friday June 13 2018 16:56:44 EST Ventricular Rate:  121 PR Interval:    QRS Duration: 95 QT Interval:  327 QTC Calculation: 464 R Axis:   133 Text Interpretation:  Atrial fibrillation Low voltage, precordial leads Probable right ventricular hypertrophy Since last tracing rate faster Confirmed by Noemi Chapel [phone]) on 06/13/2018 5:07:58 PM   Radiology Ct Abdomen Pelvis Wo Contrast  Result Date: 06/12/2018 CLINICAL DATA:  73 year old who underwent laparotomy 04/30/2018 for perforated sigmoid diverticulitis and multiple intra-abdominal abscesses for which she underwent lysis of adhesions, short segment small-bowel resection and diverting loop ileostomy. She presents now with abdominal pain and fever. EXAM: CT ABDOMEN AND PELVIS WITHOUT CONTRAST TECHNIQUE: Multidetector CT imaging of the abdomen and pelvis was performed following the standard protocol without IV contrast. Oral contrast was administered. COMPARISON:  05/07/2018, 04/27/2018 and earlier. FINDINGS: Lower chest: Mild scarring and bronchiectasis involving the lower lobes. Scarring involving  the RIGHT MIDDLE LOBE. No new abnormalities involving the lung bases. Heart mildly enlarged but stable. Mitral annular calcification again noted. Hepatobiliary: Normal unenhanced appearance of the liver. Surgically absent gallbladder. No biliary ductal dilation. Pancreas: Mild diffuse atrophy. Otherwise normal unenhanced appearance. Spleen: Normal unenhanced appearance. Adrenals/Urinary Tract: Normal appearing adrenal glands. Non-obstructing approximate 4 mm calculus in a LOWER pole calyx of the RIGHT kidney. No urinary tract calculi elsewhere. No hydronephrosis. Allowing for the unenhanced technique, no focal parenchymal abnormality involving either kidney. Small diverticulum arising from the RIGHT posterolateral wall of the urinary bladder. Opaque material within the dependent portion of the urinary bladder. Stomach/Bowel: Normal-appearing stomach containing opaque ingested tablets. Normal-appearing small bowel. Small bowel surgical anastomosis in the LEFT mid abdomen at the level of the umbilicus. Normal-appearing ileostomy in the RIGHT mid abdomen. Contrast material passes through the small bowel into the ostomy bag without obstruction. Decompressed colon with diffuse diverticulosis, including extensive descending and sigmoid colon diverticulosis. No evidence of diverticulitis. Vascular/Lymphatic: Severe aortoiliofemoral atherosclerosis without evidence of aneurysm. No pathologic lymphadenopathy. Reproductive: Normal-appearing uterus and ovaries without evidence of adnexal mass. Other: Percutaneous catheter within a completely drained abscess in the RIGHT UPPER pelvis. Surgical drain in the pelvis. No evidence of intra-abdominal or pelvic abscess currently. No ascites. Midline surgical incision without evidence of incisional hernia or abscess. Musculoskeletal: Multilevel degenerative disc disease, spondylosis and facet degenerative changes throughout the lumbar spine. Degenerative changes involving both hips. No  acute findings. IMPRESSION: 1. No acute abnormalities involving the abdomen or pelvis. 2. No evidence of intra-abdominal or pelvic abscess currently. Percutaneous catheter within a completely drained abscess in the RIGHT UPPER pelvis. 3. Non-obstructing approximate 4 mm calculus in a LOWER pole calyx of the RIGHT kidney. No urinary tract calculi elsewhere. 4. Diffuse colonic diverticulosis, including extensive descending and sigmoid colon diverticulosis. No evidence of diverticulitis. Aortic Atherosclerosis (ICD10-170.0) Electronically Signed   By: Evangeline Dakin M.D.   On: 06/12/2018 11:21    Procedures Procedures (including critical care time)  Medications Ordered in ED Medications  0.9 %  sodium chloride infusion ( Intravenous New Bag/Given 06/13/18 1730)  magnesium sulfate IVPB 2 g 50 mL (has no administration in time range)  diltiazem (CARDIZEM) injection 20 mg (20 mg Intravenous Given 06/13/18 1731)     Initial Impression / Assessment and Plan / ED Course  I have reviewed the triage vital signs and the nursing notes.  Pertinent labs & imaging results that were available during my care of the patient were  reviewed by me and considered in my medical decision making (see chart for details).  Clinical Course as of Jun 13 1825  Fri Jun 13, 2018  1801 Magnesium is low, creatinine is high, blood counts are unremarkable.  Magnesium will be repleted, IV fluids have been added for creatinine.  Urinalysis pending.  The patient's heart rate has slowed with Cardizem.   [BM]    Clinical Course User Index [BM] Noemi Chapel, MD    I see no signs of focal deficit on exam, her generalized weakness could be from any number of causes.  She is struggled with some electrolyte abnormalities after having her surgery as she has a diverting ostomy and likely has some difficulty with absorption.  That being said she does not appear to be in atrial fibrillation with a rapid rate in the 120s but this varies from  the low 100s to the mid 100s.  Will obtain IV access, give fluids, she has been getting her antibiotics through PICC line.  Review of the medical record shows that she has had multiple procedures but at this time has been deemed stable enough to be discharged home.  I question that stability given her generalized weakness and inability to help her self at home with only family members and not 24-hour live-in help.  She is not febrile, focal or ill-appearing at this time other than her tachycardia.  Labs show that she has ongoing mild anemia, she has a renal insult likely from dehydration or UTI - Urine pending, she has some very low mag and this has been repleted - she will likely need admission to the hospital - paged hospitalist at 6 PM  D/w Dr. Nehemiah Settle who will admit    Final Clinical Impressions(s) / ED Diagnoses   Final diagnoses:  AKI (acute kidney injury) ([place])  Dehydration  Weakness  Hypomagnesemia      Noemi Chapel, MD 06/13/18 1827

## 2018-06-13 NOTE — ED Triage Notes (Signed)
Pt admitted to [facility] center for rehab after extensive hospital stay.  Was [facility] and fell at home while trying to get in her house due to weakness.

## 2018-06-14 ENCOUNTER — Encounter (HOSPITAL_COMMUNITY): Payer: Self-pay | Admitting: Nephrology

## 2018-06-14 DIAGNOSIS — K5720 Diverticulitis of large intestine with perforation and abscess without bleeding: Secondary | ICD-10-CM | POA: Diagnosis not present

## 2018-06-14 DIAGNOSIS — N179 Acute kidney failure, unspecified: Secondary | ICD-10-CM | POA: Diagnosis not present

## 2018-06-14 DIAGNOSIS — E860 Dehydration: Secondary | ICD-10-CM

## 2018-06-14 DIAGNOSIS — Z932 Ileostomy status: Secondary | ICD-10-CM | POA: Diagnosis not present

## 2018-06-14 DIAGNOSIS — R531 Weakness: Secondary | ICD-10-CM | POA: Diagnosis not present

## 2018-06-14 DIAGNOSIS — R29898 Other symptoms and signs involving the musculoskeletal system: Secondary | ICD-10-CM | POA: Diagnosis not present

## 2018-06-14 LAB — BASIC METABOLIC PANEL
Anion gap: 7 (ref 5–15)
BUN: 31 mg/dL — ABNORMAL HIGH (ref 8–23)
CO2: 17 mmol/L — ABNORMAL LOW (ref 22–32)
CREATININE: 1.93 mg/dL — AB (ref 0.44–1.00)
Calcium: 9 mg/dL (ref 8.9–10.3)
Chloride: 116 mmol/L — ABNORMAL HIGH (ref 98–111)
GFR calc non Af Amer: 25 mL/min — ABNORMAL LOW (ref >60)
GFR, EST AFRICAN AMERICAN: 29 mL/min — AB (ref >60)
Glucose, Bld: 107 mg/dL — ABNORMAL HIGH (ref 70–99)
Potassium: 5.1 mmol/L (ref 3.5–5.1)
Sodium: 140 mmol/L (ref 135–145)

## 2018-06-14 LAB — GLUCOSE, CAPILLARY
Glucose-Capillary: 127 mg/dL — ABNORMAL HIGH (ref 70–99)
Glucose-Capillary: 104 mg/dL — ABNORMAL HIGH (ref 70–99)

## 2018-06-14 LAB — CBC
HCT: 40.1 % (ref 36.0–46.0)
Hemoglobin: 11.6 g/dL — ABNORMAL LOW (ref 12.0–15.0)
MCH: 31.4 pg (ref 26.0–34.0)
MCHC: 28.9 g/dL — AB (ref 30.0–36.0)
MCV: 108.4 fL — ABNORMAL HIGH (ref 80.0–100.0)
NRBC: 0.3 % — AB (ref 0.0–0.2)
Platelets: 330 10*3/uL (ref 150–400)
RBC: 3.7 MIL/uL — ABNORMAL LOW (ref 3.87–5.11)
RDW: 19.2 % — ABNORMAL HIGH (ref 11.5–15.5)
WBC: 7.5 10*3/uL (ref 4.0–10.5)

## 2018-06-14 LAB — MAGNESIUM: MAGNESIUM: 1.8 mg/dL (ref 1.7–2.4)

## 2018-06-14 LAB — PROTIME-INR
INR: 2.38
Prothrombin Time: 25.7 seconds — ABNORMAL HIGH (ref 11.4–15.2)

## 2018-06-14 MED ORDER — WARFARIN SODIUM 1 MG PO TABS
1.0000 mg | ORAL_TABLET | Freq: Once | ORAL | Status: AC
  Administered 2018-06-14: 1 mg via ORAL
  Filled 2018-06-14 (×2): qty 1

## 2018-06-14 MED ORDER — MAGNESIUM OXIDE 400 (241.3 MG) MG PO TABS
200.0000 mg | ORAL_TABLET | Freq: Two times a day (BID) | ORAL | Status: DC
  Administered 2018-06-14 – 2018-06-16 (×5): 200 mg via ORAL
  Filled 2018-06-14 (×5): qty 1

## 2018-06-14 NOTE — ED Notes (Signed)
[name], NT reported to RN that CBG was 110. Result did not flow over to Epic.

## 2018-06-14 NOTE — Progress Notes (Addendum)
[facility] Progress Note  Subjective: doesn't like the food, no specific complaints today.    Vitals:   06/14/18 1100 06/14/18 1200 06/14/18 1300 06/14/18 1432  BP: 109/90 113/81 (!) 103/56 105/65  Pulse: (!) 111 (!) 115 (!) 124 97  Resp: 12 11 13 18   Temp:    98.1 F (36.7 C)  TempSrc:    Oral  SpO2: 97% 98% 98% 97%  Weight:      Height:        Inpatient medications: . clopidogrel  75 mg Oral Q breakfast  . diltiazem  120 mg Oral Daily  . insulin aspart  0-15 Units Subcutaneous TID WC  . insulin aspart  0-5 Units Subcutaneous QHS  . isosorbide dinitrate  5 mg Oral TID AC  . latanoprost  1 drop Both Eyes QHS  . leflunomide  20 mg Oral Daily  . magnesium oxide  200 mg Oral BID  . metoprolol succinate  25 mg Oral Daily  . pantoprazole  40 mg Oral Daily  . PARoxetine  20 mg Oral Daily  . potassium chloride SA  20 mEq Oral Daily  . pravastatin  40 mg Oral QHS  . predniSONE  5 mg Oral Q breakfast  . pregabalin  50 mg Oral TID  . temazepam  30 mg Oral QHS  . timolol  1 drop Both Eyes BID  . Tofacitinib Citrate  1 tablet Oral Daily  . warfarin  1 mg Oral Once  . Warfarin - Pharmacist Dosing Inpatient   Does not apply Q24H   . sodium chloride 125 mL/hr at 06/14/18 0836   acetaminophen **OR** acetaminophen, HYDROcodone-acetaminophen, ondansetron **OR** ondansetron (ZOFRAN) IV  Exam:  alert, obese, no distress, calm  no jvd  Chest clear bilat no rales or wheezing  Cor reg no RMG  Abd midline open wound w/ VAC in place, well healed margins; one drain in LLQ and another in RLQ w/ minimal drainage. Ostomy RLQ intact.  +bs nontender abdomen  Ext no sig LE edeam bilat  NF, Ox 3   Presentation Summary: IZABELLA MARCANTEL is a 73 y.o. female with a history of diabetes, rheumatoid arthritis, fibromyalgia, GERD, hypertension, liver disease, history of stroke, recent hospitalization for diverticulitis with perforation and abscess in October 2019 until December 2019.  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  The patient still has a wound VAC in place.  Patient has been in rehab until this morning when she was finally discharged.  The patient went home, was wheeled up to her door, took 3 steps inside and her knees gave out the patient went down to the floor with assistance of the 3 people standing beside her.  She is been generally weak since then.  Denies fevers, chills, nausea, vomiting.  No palliating or provoking factors.  She needed great assistance moving from the stretcher to the hospital bed. ED Course: Rate 125 upon arrival, which improved with 1 dose of Cardizem.  Patient in atrial fibrillation with rapid ventricular response.  Blood work shows creatinine of 2.27 (baseline a week ago was 0.7-0.85.  Her creatinine has been steadily increasing since 12/29).  BUN 34.  Magnesium 1.3.  Patient bolused with IV fluids.    Home meds:  - clopidogrel 75 qd/ isosorbide dinitrate 5mg  tid/ warfarin 1.5mg  hs/ pravastatin 40 hs/ KDur 20 qd/ metoprolol xl 25 qd/ diltiazem cd 120 qd  -  insulin aspart SSI 2- 10 units tid ac  - leflunomide 20 mg qd/ dexlansoprazole 40 qd  - paroxetine 20 qd/ pregabalin 50 tid  - prednisone 5 mg qam/ tofacitinib 5 mg qd  - prns/ eyedrops/ vitamins     Hospital Problems/ Course:  Generalized weakness/ gait failure: unable to walk the day she was dc'd home from the SNF.  Here only sig finding is dehydration and AKI.  No signs infection, she is not acutely ill.  CT abd showed completely drained abscesses and no new fluid collections.  Pt has upcoming appts w/ the surgeon (midline VAC and L sided drain) and IR ( R sided drain). She is not on any abx at this point - will resume IVF's and cont check labs daily - PT consulted  AKI -  Creatinine up to 2.2 here due to dehydration / vol depletion. Creat improving w/  IVF's, down to 1.93 today .  Baseline was 0.7- 1.0 on recent admission.   Muscular deconditioning: Secondary to patient being hospitalized and essentially bedbound over the past month.  We will have physical therapy see and evaluate the patient -the patient may need more intensive rehab  Abscess sigmoid colon due to perf sigmoid diverticulitis: sp exlap, extensive LOA, SB resection, loop ileostomy, drainage of multiple intra-abd abscesses and placement of wound VAC by Dr Georgette Dover on 04/30/18 - Wound VAC in place, visible margins appear healthy and with good granulation tissue.    Hypomagnesemia: Question whether this is related to ileostomy: Contents of the ileostomy bag did not appear like the patient was experiencing high-volume output from the ileostomy. 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  - Continue home regimen  Rheumatoid arthritis  - cont tofacitinib 5mg  qd (on this x 3-4 yrs)  Fibromyalgia - taking prednisone 5 mg /d for this x 4 mos. Not sure she should be on this w/ recent bowel perforation?  Obesity  History of stroke and TIA - Continue plavix  DVT prophylaxis: Therapeutic on Coumadin Consultants: Physical therapy and pharmacy Code Status: Full code Family Communication: Husband present during interview and exam Disposition Plan: Pending     Kelly Splinter MD [facility] pgr [phone] 06/14/2018, 4:37 PM   Recent Labs  Lab 06/13/18 0756 06/13/18 1719 06/14/18 0802  NA 138 137 140  K 5.0 5.5* 5.1  CL 111 109 116*  CO2 18* 18* 17*  GLUCOSE 85 145* 107*  BUN 32* 34* 31*  CREATININE 2.00* 2.27* 1.93*  CALCIUM 9.3 9.0 9.0    Recent Labs  Lab 06/13/18 0756 06/13/18 1719  AST 43* 43*  ALT 26 29  ALKPHOS 89 97  BILITOT 0.6 0.5  PROT 5.7* 7.0  ALBUMIN 2.9* 3.3*   Recent Labs  Lab 06/11/18 0650 06/13/18 0756 06/13/18 1719 06/14/18 0508  WBC 6.9 7.1 7.8 7.5  NEUTROABS 4.6 4.9 6.2  --   HGB 10.3* 10.7* 11.2* 11.6*  HCT 35.1* 36.3 38.2 40.1  MCV 105.7* 106.8* 104.1* 108.4*  PLT 368 339 338 330   Iron/TIBC/Ferritin/ %Sat    Component Value Date/Time   IRON 45 05/05/2018 1211   TIBC 183 (L) 05/05/2018 1211   FERRITIN 3,581 (  H) 05/05/2018 1211   IRONPCTSAT 25 05/05/2018 1211

## 2018-06-14 NOTE — Progress Notes (Signed)
ANTICOAGULATION CONSULT NOTE - Follow Up Consult  Pharmacy Consult for warfarin dosing Indication: atrial fibrillation / Hx DVT  Allergies  Allergen Reactions  . Lactose Intolerance (Gi) Other (See Comments)    G.I. Upset  . Butrans [Buprenorphine] Rash and Other (See Comments)    Infected skin underneath application  . Simvastatin Rash    Patient Measurements: Height: 5\' 6"  (167.6 cm) Weight: 224 lb (101.6 kg) IBW/kg (Calculated) : 59.3 Heparin Dosing Weight:  HEPARIN DW (KG): 82.4  Vital Signs: Temp: 97.9 F (36.6 C) (01/04 0609) Temp Source: Oral (01/04 0609) BP: 113/81 (01/04 1200) Pulse Rate: 115 (01/04 1200)  Labs: Recent Labs    06/13/18 0756 06/13/18 1719 06/14/18 0508 06/14/18 0802  HGB 10.7* 11.2* 11.6*  --   HCT 36.3 38.2 40.1  --   PLT 339 338 330  --   LABPROT 29.2* 27.2* 25.7*  --   INR 2.81 2.56 2.38  --   CREATININE 2.00* 2.27*  --  1.93*    Estimated Creatinine Clearance: 31.2 mL/min (A) (by C-G formula based on SCr of 1.93 mg/dL (H)).   Assessment: Pharmacy consulted to dose warfarin for this 62 yof admitted for weakness. She has been on chronic warfarin therapy for  atrial fibrillation and history of DVT.  Patient's home regimen is warfarin 1mg  daily. INR remains withing goal range today.   Goal of Therapy:  INR 2-3 Monitor platelets by anticoagulation protocol: Yes   Plan:  Give warfarin 1mg  x1 dose today. Obtain daily INR and every- other- day CBC Monitor for signs and symptoms of bleeding.  [name] 06/14/2018,1:00 PM

## 2018-06-14 NOTE — ED Notes (Signed)
Pharmacy notified of need for 0800 dose of Isosorbide.

## 2018-06-14 NOTE — ED Notes (Signed)
Pt breakfast tray placed on bedside table. Pt resting at this time. 

## 2018-06-15 ENCOUNTER — Observation Stay (HOSPITAL_COMMUNITY): Payer: Medicare Other

## 2018-06-15 DIAGNOSIS — E860 Dehydration: Secondary | ICD-10-CM | POA: Diagnosis not present

## 2018-06-15 DIAGNOSIS — N179 Acute kidney failure, unspecified: Secondary | ICD-10-CM | POA: Diagnosis not present

## 2018-06-15 DIAGNOSIS — R531 Weakness: Secondary | ICD-10-CM

## 2018-06-15 DIAGNOSIS — M797 Fibromyalgia: Secondary | ICD-10-CM | POA: Diagnosis not present

## 2018-06-15 DIAGNOSIS — K5720 Diverticulitis of large intestine with perforation and abscess without bleeding: Secondary | ICD-10-CM | POA: Diagnosis not present

## 2018-06-15 DIAGNOSIS — I4891 Unspecified atrial fibrillation: Secondary | ICD-10-CM | POA: Diagnosis not present

## 2018-06-15 DIAGNOSIS — Z932 Ileostomy status: Secondary | ICD-10-CM | POA: Diagnosis not present

## 2018-06-15 LAB — VITAMIN B12: Vitamin B-12: 333 pg/mL (ref 180–914)

## 2018-06-15 LAB — GLUCOSE, CAPILLARY
GLUCOSE-CAPILLARY: 81 mg/dL (ref 70–99)
GLUCOSE-CAPILLARY: 116 mg/dL — AB (ref 70–99)
Glucose-Capillary: 128 mg/dL — ABNORMAL HIGH (ref 70–99)
Glucose-Capillary: 117 mg/dL — ABNORMAL HIGH (ref 70–99)

## 2018-06-15 LAB — TSH: TSH: 8.39 u[IU]/mL — ABNORMAL HIGH (ref 0.350–4.500)

## 2018-06-15 LAB — URINE CULTURE: CULTURE: NO GROWTH

## 2018-06-15 LAB — BASIC METABOLIC PANEL
Anion gap: 8 (ref 5–15)
BUN: 31 mg/dL — AB (ref 8–23)
CO2: 18 mmol/L — ABNORMAL LOW (ref 22–32)
Calcium: 8.8 mg/dL — ABNORMAL LOW (ref 8.9–10.3)
Chloride: 112 mmol/L — ABNORMAL HIGH (ref 98–111)
Creatinine, Ser: 1.9 mg/dL — ABNORMAL HIGH (ref 0.44–1.00)
GFR calc Af Amer: 30 mL/min — ABNORMAL LOW (ref >60)
GFR calc non Af Amer: 26 mL/min — ABNORMAL LOW (ref >60)
GLUCOSE: 100 mg/dL — AB (ref 70–99)
Potassium: 4.7 mmol/L (ref 3.5–5.1)
Sodium: 138 mmol/L (ref 135–145)

## 2018-06-15 LAB — FOLATE: Folate: 7.8 ng/mL (ref >5.9)

## 2018-06-15 LAB — MAGNESIUM: Magnesium: 1.3 mg/dL — ABNORMAL LOW (ref 1.7–2.4)

## 2018-06-15 LAB — CK: Total CK: 14 U/L — ABNORMAL LOW (ref 38–234)

## 2018-06-15 LAB — PROTIME-INR
INR: 2.63
PROTHROMBIN TIME: 27.7 s — AB (ref 11.4–15.2)

## 2018-06-15 LAB — T4, FREE: Free T4: 0.59 ng/dL — ABNORMAL LOW (ref 0.82–1.77)

## 2018-06-15 MED ORDER — WARFARIN SODIUM 1 MG PO TABS
1.0000 mg | ORAL_TABLET | Freq: Once | ORAL | Status: AC
  Administered 2018-06-15: 1 mg via ORAL
  Filled 2018-06-15: qty 1

## 2018-06-15 NOTE — Progress Notes (Signed)
PROGRESS NOTE  [name] HQI:[id number] DOB: 07-05-1945 DOA: 06/13/2018 PCP: [name], MD  Brief History:  73 year old female with a history of coronary artery disease, hyperlipidemia, stroke, hypertension, diabetes mellitus, rheumatoid arthritis, fibromyalgia/chronic pain presenting with generalized weakness.  The patient was recently admitted to the hospital from 04/05/2018 through 05/13/2018 due to perforated sigmoid diverticulitis with abscess.  The patient had an exploratory laparotomy with lysis of adhesions and small bowel resection with loop ileostomy on 04/30/2018 performed by Dr. [name].  A wound VAC was placed at that time.  She was subsequently discharged to the [facility] for physical therapy rehab.  She was discharged home from the [facility] on 06/13/2017.  When she arrived home, the patient had a mechanical fall as she transfer from her wheelchair to a walker.  She was too weak to get up.  As result EMS was activated and the patient was brought to emergency department for further evaluation. Notably, the patient states that even in the past week prior to discharge home from the [facility], the patient complained of some generalized weakness.  She was treated for hypomagnesemia.  A CT of her abdomen and pelvis on 06/12/2018 was negative for any acute findings, abscess.  It showed the percutaneous catheter in her abdomen extending into a completely drained abscess in the right upper pelvis.  There was no residual abscess.  In the emergency department on 06/13/2018, the patient was noted to have atrial fibrillation with RVR.  She was given a dose of diltiazem IV with improvement of her RVR.  Serum creatinine was noted to be 2.27 which is above her usual baseline.  Magnesium was 1.3.  As result, the patient was admitted for further evaluation.  Notably, the patient had an emergency department visit on 06/06/2017.  She was noted to have atrial fibrillation with RVR at that time  with heart rate in the 130s.  The patient was given a dose of diltiazem IV with improvement of her heart rate.  She was subsequent discharged back to the [facility].  Assessment/Plan: Generalized weakness -Multifactorial including AKI, hypomagnesemia, and new onset atrial fibrillation -Serum X32  -Folic acid -CPK -Check UA and urine culture -PT evaluation -TSH -Patient is still unable to walk.  She is having difficulty with transfers.  New onset atrial fibrillation with RVR -Place on telemetry -Rate is better controlled -Continue diltiazem and metoprolol succinate -TSH -Free T4 -04/10/2017 echo EF 65-70%, no WMA, trivial TR -Patient already on warfarin from her right upper extremity DVT diagnosed 04/15/2018  AKI -Baseline creatinine 0.7-1.0 -Serum creatinine peaked 2.27 -Continue IV fluids -Renal ultrasound -CPK  Perforated sigmoid diverticulitis with abscess of the sigmoid colon -Status post expiratory laparotomy, LOA, small bowel resection, loop colostomy, and wound VAC placement by Dr Georgette Dover on 04/30/18 -She has a follow-up appointment with general surgery 06/20/2018  Hypomagnesemia:  -Question whether this is related to ileostomy: Contents of the ileostomy bag did not appear like the patient was experiencing high-volume output from the ileostomy. - Replace magnesium - Doubled oral magnesium - Repeat magnesium in the morning  Essential hypertension -Continue diltiazem, Isordil, metoprolol succinate  Diabetes mellitus type 2 -Holding glipizide -Hemoglobin A1c--5.9 -NovoLog sliding scale  Coronary artery disease -No chest pain presently -Continue Plavix -Continue metoprolol succinate   Rheumatoid arthritis -Discontinue Arava--pt states she no longer takes it -pt follows Dr. Amil Amen in outpatient setting - cont tofacitinib 5mg  qd (on this x 3-4  yrs) -Continue home dose prednisone 5 mg daily  Hyperlipidemia -Continue pravastatin  Chronic pain  syndrome/Fibromyalgia -continue home dose hydrocodone -Continue Lyrica  History of stroke and TIA - Continue plavix    Disposition Plan:   Home vs SNF 1-2 days  Family Communication:   Spouse update  at bedside 1/5  Consultants:  none  Code Status:  FULL   DVT Prophylaxis: warfarin   Procedures: As Listed in Progress Note Above  Antibiotics: None       Subjective: Patient is still feeling generalized weakness.  She denies any fevers, chills, headache, chest pain, shortness breath, nausea, vomiting, diarrhea, abdominal pain.  There is no dysuria hematuria but there is no rashes.  Objective: Vitals:   06/14/18 1300 06/14/18 1432 06/14/18 2131 06/15/18 0540  BP: (!) 103/56 105/65 116/79 112/76  Pulse: (!) 124 97 (!) 109 (!) 102  Resp: 13 18 16 18   Temp:  98.1 F (36.7 C) 98.3 F (36.8 C) (!) 97.4 F (36.3 C)  TempSrc:  Oral Oral Oral  SpO2: 98% 97% 97% (!) 84%  Weight:      Height:        Intake/Output Summary (Last 24 hours) at 06/15/2018 0904 Last data filed at 06/15/2018 4081 Gross per 24 hour  Intake 2186.05 ml  Output 1100 ml  Net 1086.05 ml   Weight change:  Exam:   General:  Pt is alert, follows commands appropriately, not in acute distress  HEENT: No icterus, No thrush, No neck mass, [place]/AT  Cardiovascular: RRR, S1/S2, no rubs, no gallops  Respiratory: CTA bilaterally, no wheezing, no crackles, no rhonchi  Abdomen: Soft/+BS, non tender, non distended, no guarding  Extremities: Trace lower extremity edema, No lymphangitis, No petechiae, No rashes, no synovitis   Data Reviewed: I have personally reviewed following labs and imaging studies Basic Metabolic Panel: Recent Labs  Lab 06/10/18 1314  06/12/18 0700 06/13/18 0756 06/13/18 1719 06/14/18 0802 06/15/18 0609  NA 137   < > 139 138 137 140 138  K 5.0   < > 4.5 5.0 5.5* 5.1 4.7  CL 111   < > 113* 111 109 116* 112*  CO2 15*   < > 18* 18* 18* 17* 18*  GLUCOSE 144*   < > 122* 85 145*  107* 100*  BUN 32*   < > 32* 32* 34* 31* 31*  CREATININE 1.75*   < > 1.92* 2.00* 2.27* 1.93* 1.90*  CALCIUM 8.7*   < > 8.8* 9.3 9.0 9.0 8.8*  MG 1.4*  --  1.5* 1.3* 1.3* 1.8  --    < > = values in this interval not displayed.   Liver Function Tests: Recent Labs  Lab 06/13/18 0756 06/13/18 1719  AST 43* 43*  ALT 26 29  ALKPHOS 89 97  BILITOT 0.6 0.5  PROT 5.7* 7.0  ALBUMIN 2.9* 3.3*   No results for input(s): LIPASE, AMYLASE in the last 168 hours. No results for input(s): AMMONIA in the last 168 hours. Coagulation Profile: Recent Labs  Lab 06/12/18 0700 06/13/18 0756 06/13/18 1719 06/14/18 0508 06/15/18 0609  INR 2.72 2.81 2.56 2.38 2.63   CBC: Recent Labs  Lab 06/08/18 1220 06/10/18 1314 06/11/18 0650 06/13/18 0756 06/13/18 1719 06/14/18 0508  WBC 7.1 7.6 6.9 7.1 7.8 7.5  NEUTROABS 5.3  --  4.6 4.9 6.2  --   HGB 10.3* 10.3* 10.3* 10.7* 11.2* 11.6*  HCT 34.9* 35.7* 35.1* 36.3 38.2 40.1  MCV 103.9* 105.9* 105.7* 106.8* 104.1*  108.4*  PLT 377 379 368 339 338 330   Cardiac Enzymes: No results for input(s): CKTOTAL, CKMB, CKMBINDEX, TROPONINI in the last 168 hours. BNP: Invalid input(s): POCBNP CBG: Recent Labs  Lab 06/13/18 2227 06/14/18 1628 06/14/18 2128 06/15/18 0726  GLUCAP 90 127* 104* 81   HbA1C: No results for input(s): HGBA1C in the last 72 hours. Urine analysis:    Component Value Date/Time   COLORURINE YELLOW 06/13/2018 1825   APPEARANCEUR CLEAR 06/13/2018 1825   LABSPEC 1.009 06/13/2018 1825   PHURINE 5.0 06/13/2018 1825   GLUCOSEU NEGATIVE 06/13/2018 1825   HGBUR MODERATE (A) 06/13/2018 1825   BILIRUBINUR NEGATIVE 06/13/2018 1825   KETONESUR NEGATIVE 06/13/2018 1825   PROTEINUR NEGATIVE 06/13/2018 1825   UROBILINOGEN 0.2 06/20/2013 1832   NITRITE NEGATIVE 06/13/2018 1825   LEUKOCYTESUR NEGATIVE 06/13/2018 1825   Sepsis Labs: @LABRCNTIP (procalcitonin:4,lacticidven:4) ) Recent Results (from the past 240 hour(s))  Urine culture      Status: None   Collection Time: 06/13/18  6:30 PM  Result Value Ref Range Status   Specimen Description   Final    URINE, CATHETERIZED Performed at [facility], [address] [place], [place] [address]    Special Requests   Final    NONE Performed at [facility], [address] [place], [place] [address]    Culture   Final    NO GROWTH Performed at [facility], [place] [address] [place], [place] [address]    Report Status 06/15/2018 FINAL  Final     Scheduled Meds: . clopidogrel  75 mg Oral Q breakfast  . diltiazem  120 mg Oral Daily  . insulin aspart  0-15 Units Subcutaneous TID WC  . insulin aspart  0-5 Units Subcutaneous QHS  . isosorbide dinitrate  5 mg Oral TID AC  . latanoprost  1 drop Both Eyes QHS  . leflunomide  20 mg Oral Daily  . magnesium oxide  200 mg Oral BID  . metoprolol succinate  25 mg Oral Daily  . pantoprazole  40 mg Oral Daily  . PARoxetine  20 mg Oral Daily  . pravastatin  40 mg Oral QHS  . predniSONE  5 mg Oral Q breakfast  . pregabalin  50 mg Oral TID  . temazepam  30 mg Oral QHS  . timolol  1 drop Both Eyes BID  . Tofacitinib Citrate  1 tablet Oral Daily  . Warfarin - Pharmacist Dosing Inpatient   Does not apply Q24H   Continuous Infusions: . sodium chloride 100 mL/hr at 06/14/18 2255    Procedures/Studies: Ct Abdomen Pelvis Wo Contrast  Result Date: 06/12/2018 CLINICAL DATA:  73 year old who underwent laparotomy 04/30/2018 for perforated sigmoid diverticulitis and multiple intra-abdominal abscesses for which she underwent lysis of adhesions, short segment small-bowel resection and diverting loop ileostomy. She presents now with abdominal pain and fever. EXAM: CT ABDOMEN AND PELVIS WITHOUT CONTRAST TECHNIQUE: Multidetector CT imaging of the abdomen and pelvis was performed following the standard protocol without IV contrast. Oral contrast was administered. COMPARISON:  05/07/2018, 04/27/2018 and earlier. FINDINGS: Lower chest: Mild  scarring and bronchiectasis involving the lower lobes. Scarring involving the RIGHT MIDDLE LOBE. No new abnormalities involving the lung bases. Heart mildly enlarged but stable. Mitral annular calcification again noted. Hepatobiliary: Normal unenhanced appearance of the liver. Surgically absent gallbladder. No biliary ductal dilation. Pancreas: Mild diffuse atrophy. Otherwise normal unenhanced appearance. Spleen: Normal unenhanced appearance. Adrenals/Urinary Tract: Normal appearing adrenal glands. Non-obstructing approximate 4 mm calculus in a LOWER  pole calyx of the RIGHT kidney. No urinary tract calculi elsewhere. No hydronephrosis. Allowing for the unenhanced technique, no focal parenchymal abnormality involving either kidney. Small diverticulum arising from the RIGHT posterolateral wall of the urinary bladder. Opaque material within the dependent portion of the urinary bladder. Stomach/Bowel: Normal-appearing stomach containing opaque ingested tablets. Normal-appearing small bowel. Small bowel surgical anastomosis in the LEFT mid abdomen at the level of the umbilicus. Normal-appearing ileostomy in the RIGHT mid abdomen. Contrast material passes through the small bowel into the ostomy bag without obstruction. Decompressed colon with diffuse diverticulosis, including extensive descending and sigmoid colon diverticulosis. No evidence of diverticulitis. Vascular/Lymphatic: Severe aortoiliofemoral atherosclerosis without evidence of aneurysm. No pathologic lymphadenopathy. Reproductive: Normal-appearing uterus and ovaries without evidence of adnexal mass. Other: Percutaneous catheter within a completely drained abscess in the RIGHT UPPER pelvis. Surgical drain in the pelvis. No evidence of intra-abdominal or pelvic abscess currently. No ascites. Midline surgical incision without evidence of incisional hernia or abscess. Musculoskeletal: Multilevel degenerative disc disease, spondylosis and facet degenerative changes  throughout the lumbar spine. Degenerative changes involving both hips. No acute findings. IMPRESSION: 1. No acute abnormalities involving the abdomen or pelvis. 2. No evidence of intra-abdominal or pelvic abscess currently. Percutaneous catheter within a completely drained abscess in the RIGHT UPPER pelvis. 3. Non-obstructing approximate 4 mm calculus in a LOWER pole calyx of the RIGHT kidney. No urinary tract calculi elsewhere. 4. Diffuse colonic diverticulosis, including extensive descending and sigmoid colon diverticulosis. No evidence of diverticulitis. Aortic Atherosclerosis (ICD10-170.0) Electronically Signed   By: Evangeline Dakin M.D.   On: 06/12/2018 11:21   Ct Head Wo Contrast  Result Date: 06/08/2018 CLINICAL DATA:  Patient status post fall. No reported loss of consciousness. EXAM: CT HEAD WITHOUT CONTRAST CT CERVICAL SPINE WITHOUT CONTRAST TECHNIQUE: Multidetector CT imaging of the head and cervical spine was performed following the standard protocol without intravenous contrast. Multiplanar CT image reconstructions of the cervical spine were also generated. COMPARISON:  Cervical spine CT 06/27/2016 FINDINGS: CT HEAD FINDINGS Brain: Ventricles and sulci are prominent compatible with atrophy. Periventricular and subcortical white matter hypodensity compatible with chronic microvascular ischemic changes. No evidence for acute cortically based infarct, intracranial hemorrhage, mass lesion or mass-effect. Basal ganglia calcifications. Vascular: Unremarkable Skull: Intact. Sinuses/Orbits: Paranasal sinuses are well aerated. Mastoid air cells are unremarkable. Orbits are unremarkable. Other: None. CT CERVICAL SPINE FINDINGS Alignment: Normal anatomic alignment. Skull base and vertebrae: No acute fracture. No primary bone lesion or focal pathologic process. Soft tissues and spinal canal: No prevertebral fluid or swelling. No visible canal hematoma. Disc levels: Multilevel degenerative disc disease most  pronounced C5-6 and C6-7. no acute fracture. Upper chest: Unremarkable. Other: Bilateral carotid arterial vascular calcifications. IMPRESSION: No acute intracranial process. No acute cervical spine fracture. Electronically Signed   By: Lovey Newcomer M.D.   On: 06/08/2018 13:41   Ct Cervical Spine Wo Contrast  Result Date: 06/08/2018 CLINICAL DATA:  Patient status post fall. No reported loss of consciousness. EXAM: CT HEAD WITHOUT CONTRAST CT CERVICAL SPINE WITHOUT CONTRAST TECHNIQUE: Multidetector CT imaging of the head and cervical spine was performed following the standard protocol without intravenous contrast. Multiplanar CT image reconstructions of the cervical spine were also generated. COMPARISON:  Cervical spine CT 06/27/2016 FINDINGS: CT HEAD FINDINGS Brain: Ventricles and sulci are prominent compatible with atrophy. Periventricular and subcortical white matter hypodensity compatible with chronic microvascular ischemic changes. No evidence for acute cortically based infarct, intracranial hemorrhage, mass lesion or mass-effect. Basal ganglia calcifications. Vascular: Unremarkable Skull:  Intact. Sinuses/Orbits: Paranasal sinuses are well aerated. Mastoid air cells are unremarkable. Orbits are unremarkable. Other: None. CT CERVICAL SPINE FINDINGS Alignment: Normal anatomic alignment. Skull base and vertebrae: No acute fracture. No primary bone lesion or focal pathologic process. Soft tissues and spinal canal: No prevertebral fluid or swelling. No visible canal hematoma. Disc levels: Multilevel degenerative disc disease most pronounced C5-6 and C6-7. no acute fracture. Upper chest: Unremarkable. Other: Bilateral carotid arterial vascular calcifications. IMPRESSION: No acute intracranial process. No acute cervical spine fracture. Electronically Signed   By: Lovey Newcomer M.D.   On: 06/08/2018 13:41   Dg Chest Port 1 View  Result Date: 06/10/2018 CLINICAL DATA:  PICC placement. EXAM: PORTABLE CHEST 1 VIEW  COMPARISON:  04/11/2018 FINDINGS: PICC tip is in the superior vena cava at the level of the azygos vein. Heart size and vascularity are normal and the lungs are clear. CABG. No acute bone abnormality. IMPRESSION: PICC in good position. No acute cardiopulmonary findings. Electronically Signed   By: Lorriane Shire M.D.   On: 06/10/2018 17:51   Dg Chest Port 1v Same Day  Result Date: 06/10/2018 CLINICAL DATA:  PICC placement. EXAM: PORTABLE CHEST 1 VIEW 6:15 p.m. COMPARISON:  06/10/2018 at 5:24 p.m. FINDINGS: PICC tip remains unchanged in position in the superior vena cava at the level of the azygos vein. Heart size and vascularity are normal. Lungs are clear. CABG. IMPRESSION: No change in the PICC tip in the superior vena cava. No acute cardiopulmonary findings. Electronically Signed   By: Lorriane Shire M.D.   On: 06/10/2018 18:37   Dg Humerus Left  Result Date: 06/10/2018 CLINICAL DATA:  PICC line placement EXAM: LEFT HUMERUS - 2+ VIEW COMPARISON:  None. FINDINGS: There is a PICC line identified in the upper left arm in the expected course. The distal portion of the PICC line is not included on film. IMPRESSION: There is a PICC line identified in the upper left arm in the expected course. The distal portion of the PICC line is not included on film. Electronically Signed   By: Abelardo Diesel M.D.   On: 06/10/2018 18:02   Ir Picc Replacement Leftinc Imgguide  Result Date: 05/19/2018 INDICATION: Malfunctioning PICC line. Diverticular abscess. Need to continue IV antibiotics. EXAM: FLUOROSCOPIC GUIDED PICC LINE EXCHANGE TECHNIQUE: The procedure, risks, benefits, and alternatives were explained to the patient and informed written consent was obtained. The left upper extremity and external portion of the existing PICC line was prepped with chlorhexidine in a sterile fashion, and a sterile drape was applied covering the operative field. Maximum barrier sterile technique with sterile gowns and gloves were  used for the procedure. A timeout was performed prior to the initiation of the procedure. Local anesthesia was provided with 1% lidocaine. The existing PICC line was cannulated with an 0.0018 wire which was advanced through the catheter. The catheter was exchanged for a peel-away sheath, ultimately allowing advancement of a 46-cm, 5- [place], double lumen PICC line to the level of the superior caval atrial junction. A post procedure spot fluoroscopic image was obtained. The catheter easily aspirated and flushed and was secured in place. A dressing was placed. The patient tolerated the procedure well without immediate post procedural complication. FINDINGS: After catheter exchange, the tip lies within the superior cavoatrial junction. The catheter aspirates and flushes normally, it was capped, and is ready for immediate use. IMPRESSION: Successful fluoroscopic guided exchange of left upper extremity approach 46 cm, 5 - [place], double lumen PICC with tip overlying  the superior caval atrial junction. The PICC line is ready for immediate use. Read by: Gareth Eagle, PA-C Electronically Signed   By: Markus Daft M.D.   On: 05/19/2018 13:50    Orson Eva, DO  [facility] Pager [phone]  If 7PM-7AM, please contact night-coverage [email or web address] Password [email or web address] 06/15/2018, 9:04 AM   LOS: 0 days

## 2018-06-15 NOTE — Progress Notes (Addendum)
ANTICOAGULATION CONSULT NOTE - Follow Up Consult  Pharmacy Consult for warfarin dosing Indication: atrial fibrillation / Hx DVT Concurrent anti-platelet meds: Plavix   Allergies  Allergen Reactions  . Lactose Intolerance (Gi) Other (See Comments)    G.I. Upset  . Butrans [Buprenorphine] Rash and Other (See Comments)    Infected skin underneath application  . Simvastatin Rash    Patient Measurements: Height: 5\' 6"  (167.6 cm) Weight: 224 lb (101.6 kg) IBW/kg (Calculated) : 59.3 Heparin Dosing Weight:  HEPARIN DW (KG): 82.4  Vital Signs: Temp: 97.4 F (36.3 C) (01/05 0540) Temp Source: Oral (01/05 0540) BP: 112/76 (01/05 0540) Pulse Rate: 102 (01/05 0540)  Labs: Recent Labs    06/13/18 0756 06/13/18 1719 06/14/18 0508 06/14/18 0802 06/15/18 0609 06/15/18 1016  HGB 10.7* 11.2* 11.6*  --   --   --   HCT 36.3 38.2 40.1  --   --   --   PLT 339 338 330  --   --   --   LABPROT 29.2* 27.2* 25.7*  --  27.7*  --   INR 2.81 2.56 2.38  --  2.63  --   CREATININE 2.00* 2.27*  --  1.93* 1.90*  --   CKTOTAL  --   --   --   --   --  14*    Estimated Creatinine Clearance: 31.7 mL/min (A) (by C-G formula based on SCr of 1.9 mg/dL (H)).   Assessment: Pharmacy consulted to dose warfarin for this 59 yof admitted for weakness. She has been on chronic warfarin therapy for  atrial fibrillation and history of DVT.  Patient's home regimen is warfarin 1mg  daily. INR remains within  goal range today.   Goal of Therapy:  INR 2-3 Monitor platelets by anticoagulation protocol: Yes   Plan:  Repeat warfarin 1mg  x1 dose today. Obtain daily INR and every- other- day CBC Monitor for signs and symptoms of bleeding.  [name] 06/15/2018,11:56 AM

## 2018-06-16 ENCOUNTER — Ambulatory Visit: Payer: Self-pay

## 2018-06-16 DIAGNOSIS — Z7902 Long term (current) use of antithrombotics/antiplatelets: Secondary | ICD-10-CM | POA: Diagnosis not present

## 2018-06-16 DIAGNOSIS — Z4801 Encounter for change or removal of surgical wound dressing: Secondary | ICD-10-CM | POA: Diagnosis not present

## 2018-06-16 DIAGNOSIS — Z7952 Long term (current) use of systemic steroids: Secondary | ICD-10-CM | POA: Diagnosis not present

## 2018-06-16 DIAGNOSIS — I639 Cerebral infarction, unspecified: Secondary | ICD-10-CM | POA: Diagnosis not present

## 2018-06-16 DIAGNOSIS — I4891 Unspecified atrial fibrillation: Secondary | ICD-10-CM | POA: Diagnosis not present

## 2018-06-16 DIAGNOSIS — I25708 Atherosclerosis of coronary artery bypass graft(s), unspecified, with other forms of angina pectoris: Secondary | ICD-10-CM

## 2018-06-16 DIAGNOSIS — E7800 Pure hypercholesterolemia, unspecified: Secondary | ICD-10-CM | POA: Diagnosis present

## 2018-06-16 DIAGNOSIS — Z7401 Bed confinement status: Secondary | ICD-10-CM | POA: Diagnosis not present

## 2018-06-16 DIAGNOSIS — Z7901 Long term (current) use of anticoagulants: Secondary | ICD-10-CM | POA: Diagnosis not present

## 2018-06-16 DIAGNOSIS — G4733 Obstructive sleep apnea (adult) (pediatric): Secondary | ICD-10-CM | POA: Diagnosis not present

## 2018-06-16 DIAGNOSIS — Z48815 Encounter for surgical aftercare following surgery on the digestive system: Secondary | ICD-10-CM | POA: Diagnosis not present

## 2018-06-16 DIAGNOSIS — I480 Paroxysmal atrial fibrillation: Secondary | ICD-10-CM | POA: Diagnosis present

## 2018-06-16 DIAGNOSIS — A419 Sepsis, unspecified organism: Secondary | ICD-10-CM | POA: Diagnosis not present

## 2018-06-16 DIAGNOSIS — Z932 Ileostomy status: Secondary | ICD-10-CM | POA: Diagnosis not present

## 2018-06-16 DIAGNOSIS — Z8249 Family history of ischemic heart disease and other diseases of the circulatory system: Secondary | ICD-10-CM | POA: Diagnosis not present

## 2018-06-16 DIAGNOSIS — E1122 Type 2 diabetes mellitus with diabetic chronic kidney disease: Secondary | ICD-10-CM | POA: Diagnosis present

## 2018-06-16 DIAGNOSIS — R2689 Other abnormalities of gait and mobility: Secondary | ICD-10-CM | POA: Diagnosis not present

## 2018-06-16 DIAGNOSIS — G459 Transient cerebral ischemic attack, unspecified: Secondary | ICD-10-CM | POA: Diagnosis present

## 2018-06-16 DIAGNOSIS — N179 Acute kidney failure, unspecified: Secondary | ICD-10-CM | POA: Diagnosis present

## 2018-06-16 DIAGNOSIS — Z951 Presence of aortocoronary bypass graft: Secondary | ICD-10-CM | POA: Diagnosis not present

## 2018-06-16 DIAGNOSIS — M797 Fibromyalgia: Secondary | ICD-10-CM

## 2018-06-16 DIAGNOSIS — E119 Type 2 diabetes mellitus without complications: Secondary | ICD-10-CM | POA: Diagnosis not present

## 2018-06-16 DIAGNOSIS — E785 Hyperlipidemia, unspecified: Secondary | ICD-10-CM | POA: Diagnosis not present

## 2018-06-16 DIAGNOSIS — I2510 Atherosclerotic heart disease of native coronary artery without angina pectoris: Secondary | ICD-10-CM | POA: Diagnosis present

## 2018-06-16 DIAGNOSIS — E860 Dehydration: Secondary | ICD-10-CM | POA: Diagnosis not present

## 2018-06-16 DIAGNOSIS — D638 Anemia in other chronic diseases classified elsewhere: Secondary | ICD-10-CM | POA: Diagnosis present

## 2018-06-16 DIAGNOSIS — I129 Hypertensive chronic kidney disease with stage 1 through stage 4 chronic kidney disease, or unspecified chronic kidney disease: Secondary | ICD-10-CM | POA: Diagnosis present

## 2018-06-16 DIAGNOSIS — M069 Rheumatoid arthritis, unspecified: Secondary | ICD-10-CM | POA: Diagnosis present

## 2018-06-16 DIAGNOSIS — R41841 Cognitive communication deficit: Secondary | ICD-10-CM | POA: Diagnosis not present

## 2018-06-16 DIAGNOSIS — K219 Gastro-esophageal reflux disease without esophagitis: Secondary | ICD-10-CM | POA: Diagnosis present

## 2018-06-16 DIAGNOSIS — K5720 Diverticulitis of large intestine with perforation and abscess without bleeding: Secondary | ICD-10-CM | POA: Diagnosis not present

## 2018-06-16 DIAGNOSIS — G4730 Sleep apnea, unspecified: Secondary | ICD-10-CM | POA: Diagnosis present

## 2018-06-16 DIAGNOSIS — I82621 Acute embolism and thrombosis of deep veins of right upper extremity: Secondary | ICD-10-CM | POA: Diagnosis not present

## 2018-06-16 DIAGNOSIS — Z8261 Family history of arthritis: Secondary | ICD-10-CM | POA: Diagnosis not present

## 2018-06-16 DIAGNOSIS — R531 Weakness: Secondary | ICD-10-CM | POA: Diagnosis not present

## 2018-06-16 DIAGNOSIS — M6281 Muscle weakness (generalized): Secondary | ICD-10-CM | POA: Diagnosis not present

## 2018-06-16 DIAGNOSIS — E1151 Type 2 diabetes mellitus with diabetic peripheral angiopathy without gangrene: Secondary | ICD-10-CM | POA: Diagnosis present

## 2018-06-16 DIAGNOSIS — Z8673 Personal history of transient ischemic attack (TIA), and cerebral infarction without residual deficits: Secondary | ICD-10-CM | POA: Diagnosis not present

## 2018-06-16 DIAGNOSIS — N183 Chronic kidney disease, stage 3 (moderate): Secondary | ICD-10-CM | POA: Diagnosis present

## 2018-06-16 DIAGNOSIS — I10 Essential (primary) hypertension: Secondary | ICD-10-CM

## 2018-06-16 DIAGNOSIS — Z794 Long term (current) use of insulin: Secondary | ICD-10-CM | POA: Diagnosis not present

## 2018-06-16 DIAGNOSIS — G8929 Other chronic pain: Secondary | ICD-10-CM | POA: Diagnosis present

## 2018-06-16 DIAGNOSIS — Z9049 Acquired absence of other specified parts of digestive tract: Secondary | ICD-10-CM | POA: Diagnosis not present

## 2018-06-16 LAB — GLUCOSE, CAPILLARY
Glucose-Capillary: 110 mg/dL — ABNORMAL HIGH (ref 70–99)
Glucose-Capillary: 114 mg/dL — ABNORMAL HIGH (ref 70–99)
Glucose-Capillary: 99 mg/dL (ref 70–99)
Glucose-Capillary: 137 mg/dL — ABNORMAL HIGH (ref 70–99)
Glucose-Capillary: 134 mg/dL — ABNORMAL HIGH (ref 70–99)
Glucose-Capillary: 103 mg/dL — ABNORMAL HIGH (ref 70–99)

## 2018-06-16 LAB — MAGNESIUM: Magnesium: 1.2 mg/dL — ABNORMAL LOW (ref 1.7–2.4)

## 2018-06-16 LAB — BASIC METABOLIC PANEL
ANION GAP: 5 (ref 5–15)
BUN: 29 mg/dL — ABNORMAL HIGH (ref 8–23)
CO2: 17 mmol/L — ABNORMAL LOW (ref 22–32)
Calcium: 8.5 mg/dL — ABNORMAL LOW (ref 8.9–10.3)
Chloride: 117 mmol/L — ABNORMAL HIGH (ref 98–111)
Creatinine, Ser: 1.75 mg/dL — ABNORMAL HIGH (ref 0.44–1.00)
GFR calc Af Amer: 33 mL/min — ABNORMAL LOW (ref >60)
GFR calc non Af Amer: 28 mL/min — ABNORMAL LOW (ref >60)
Glucose, Bld: 104 mg/dL — ABNORMAL HIGH (ref 70–99)
POTASSIUM: 4.6 mmol/L (ref 3.5–5.1)
Sodium: 139 mmol/L (ref 135–145)

## 2018-06-16 LAB — PROTIME-INR
INR: 2.33
Prothrombin Time: 25.2 seconds — ABNORMAL HIGH (ref 11.4–15.2)

## 2018-06-16 MED ORDER — WARFARIN SODIUM 1 MG PO TABS
1.0000 mg | ORAL_TABLET | Freq: Once | ORAL | Status: AC
  Administered 2018-06-16: 1 mg via ORAL
  Filled 2018-06-16: qty 1

## 2018-06-16 MED ORDER — MAGNESIUM SULFATE 4 GM/100ML IV SOLN
4.0000 g | Freq: Once | INTRAVENOUS | Status: AC
  Administered 2018-06-16: 4 g via INTRAVENOUS
  Filled 2018-06-16: qty 100

## 2018-06-16 MED ORDER — METOPROLOL SUCCINATE ER 25 MG PO TB24
25.0000 mg | ORAL_TABLET | Freq: Once | ORAL | Status: AC
  Administered 2018-06-16: 25 mg via ORAL
  Filled 2018-06-16: qty 1

## 2018-06-16 MED ORDER — MAGNESIUM CHLORIDE 64 MG PO TBEC
2.0000 | DELAYED_RELEASE_TABLET | Freq: Three times a day (TID) | ORAL | Status: DC
  Administered 2018-06-17: 128 mg via ORAL
  Filled 2018-06-16 (×4): qty 2

## 2018-06-16 MED ORDER — METOPROLOL SUCCINATE ER 50 MG PO TB24
50.0000 mg | ORAL_TABLET | Freq: Every day | ORAL | Status: DC
  Administered 2018-06-17: 50 mg via ORAL
  Filled 2018-06-16: qty 1

## 2018-06-16 NOTE — Plan of Care (Signed)
  Problem: Acute Rehab OT Goals (only OT should resolve) Goal: Pt. Will Perform Eating Flowsheets (Taken 06/16/2018 0857) Pt Will Perform Eating: with modified independence; sitting Goal: Pt. Will Perform Grooming Flowsheets (Taken 06/16/2018 0857) Pt Will Perform Grooming: with set-up; sitting Goal: Pt. Will Perform Upper Body Dressing Flowsheets (Taken 06/16/2018 0857) Pt Will Perform Upper Body Dressing: with min assist; sitting Goal: Pt. Will Transfer To Toilet Flowsheets (Taken 06/16/2018 0857) Pt Will Transfer to Toilet: with mod assist; stand pivot transfer; bedside commode Goal: Pt. Will Perform Toileting-Clothing Manipulation Flowsheets (Taken 06/16/2018 0857) Pt Will Perform Toileting - Clothing Manipulation and hygiene: with min assist; sitting/lateral leans; sit to/from stand Goal: Pt/Caregiver Will Perform Home Exercise Program Flowsheets (Taken 06/16/2018 0857) Pt/caregiver will Perform Home Exercise Program: Increased strength; Both right and left upper extremity; With written HEP provided

## 2018-06-16 NOTE — Progress Notes (Signed)
PROGRESS NOTE  [name] TLX:[id number] DOB: 1945-06-26 DOA: 06/13/2018 PCP: [name], MD   Brief History:  73 year old female with a history of coronary artery disease, hyperlipidemia, stroke, hypertension, diabetes mellitus, rheumatoid arthritis, fibromyalgia/chronic pain presenting with generalized weakness.  The patient was recently admitted to the hospital from 04/05/2018 through 05/13/2018 due to perforated sigmoid diverticulitis with abscess.  The patient had an exploratory laparotomy with lysis of adhesions and small bowel resection with loop ileostomy on 04/30/2018 performed by Dr. [name].  A wound VAC was placed at that time.  She was subsequently discharged to the [facility] for physical therapy rehab.  She was discharged home from the [facility] on 06/13/2017.  When she arrived home, the patient had a mechanical fall as she transfer from her wheelchair to a walker.  She was too weak to get up.  As result EMS was activated and the patient was brought to emergency department for further evaluation. Notably, the patient states that even in the past week prior to discharge home from the [facility], the patient complained of some generalized weakness.  She was treated for hypomagnesemia.  A CT of her abdomen and pelvis on 06/12/2018 was negative for any acute findings, abscess.  It showed the percutaneous catheter in her abdomen extending into a completely drained abscess in the right upper pelvis.  There was no residual abscess.  In the emergency department on 06/13/2018, the patient was noted to have atrial fibrillation with RVR.  She was given a dose of diltiazem IV with improvement of her RVR.  Serum creatinine was noted to be 2.27 which is above her usual baseline.  Magnesium was 1.3.  As result, the patient was admitted for further evaluation.  Notably, the patient had an emergency department visit on 06/06/2017.  She was noted to have atrial fibrillation with RVR at that  time with heart rate in the 130s.  The patient was given a dose of diltiazem IV with improvement of her heart rate.  She was subsequent discharged back to the [facility].  Assessment/Plan: Generalized weakness -Multifactorial including AKI, hypomagnesemia, and new onset atrial fibrillation -Serum R41  -Folic ULAG--5.3 -MIW--80 -Check UA--no pyuria -TSH--8.390; free T4 0.59 -Patient is still unable to walk.  She is having difficulty with transfers. -PT eval>>>> skilled nursing facility  New onset atrial fibrillation with RVR -Place on telemetry -Rate is better controlled -Continue diltiazem and metoprolol succinate -TSH--8.390 -Free T4--0.59 -04/10/2017 echo EF 65-70%, no WMA, trivial TR -Patient already on warfarin from her right upper extremity DVT diagnosed 04/15/2018 -Appreciate cardiology consult--> increase metoprolol succinate to 50 mg daily  AKI -Baseline creatinine 0.7-1.0 -Serum creatinine peaked 2.27 -Continue IV fluids--increased to 125 cc/h -Renal ultrasound--negative for hydronephrosis -CPK--14  Perforated sigmoid diverticulitis with abscess of the sigmoid colon -Status post expiratory laparotomy, LOA, small bowel resection, loop colostomy, and wound VAC placement by Dr Georgette Dover on 04/30/18 -She has a follow-up appointment with general surgery 06/18/2018@1030  AM  Hypomagnesemia: -Question whether this is related to ileostomy: Contents of the ileostomy bag did not appear like the patient was experiencing high-volume output from the ileostomy. -Replace magnesium -start slow mag -give IV mag -Repeat magnesium in the morning  Essential hypertension -Continue diltiazem, Isordil, metoprolol succinate  Diabetes mellitus type 2 -Holding glipizide -Hemoglobin A1c--5.9 -NovoLog sliding scale  Coronary artery disease -No chest pain presently -Continue Plavix -Continuemetoprolol succinate   Rheumatoid arthritis -Discontinue Arava--pt states she no  longer takes it -pt follows Dr. Amil Amen in outpatient setting - cont tofacitinib 5mg  qd (on this x 3-4 yrs) -Continue home dose prednisone 5 mg daily  Hyperlipidemia -Continue pravastatin  Chronic pain syndrome/Fibromyalgia -continue home dose hydrocodone -Continue Lyrica  History of stroke and TIA -Continue plavix    Disposition Plan:  SNF 1/7 or 1/8  Family Communication:   Spouse update  at bedside 1/6  Consultants:  none  Code Status:  FULL   DVT Prophylaxis: warfarin   Procedures: As Listed in Progress Note Above  Antibiotics: None      Subjective: Patient denies fevers, chills, headache, chest pain, dyspnea, nausea, vomiting, diarrhea, abdominal pain, dysuria, hematuria, hematochezia, and melena.   Objective: Vitals:   06/15/18 2116 06/16/18 0530 06/16/18 0922 06/16/18 1404  BP: (!) 124/55 128/78  137/80  Pulse: 94 (!) 110  (!) 103  Resp: 18 16  17   Temp: 98.1 F (36.7 C) 97.8 F (36.6 C)  98.4 F (36.9 C)  TempSrc: Oral Oral    SpO2: 99% 100% 98% 94%  Weight:      Height:        Intake/Output Summary (Last 24 hours) at 06/16/2018 1659 Last data filed at 06/16/2018 1138 Gross per 24 hour  Intake 480 ml  Output 1900 ml  Net -1420 ml   Weight change:  Exam:   General:  Pt is alert, follows commands appropriately, not in acute distress  HEENT: No icterus, No thrush, No neck mass, /AT  Cardiovascular: RRR, S1/S2, no rubs, no gallops  Respiratory: bibasilar crackles, no wheeze  Abdomen: Soft/+BS, non tender, non distended, no guarding  Extremities: trace LE edema, No lymphangitis, No petechiae, No rashes, no synovitis   Data Reviewed: I have personally reviewed following labs and imaging studies Basic Metabolic Panel: Recent Labs  Lab 06/13/18 0756 06/13/18 1719 06/14/18 0802 06/15/18 0609 06/15/18 1016 06/16/18 0555  NA 138 137 140 138  --  139  K 5.0 5.5* 5.1 4.7  --  4.6  CL 111 109 116* 112*  --  117*    CO2 18* 18* 17* 18*  --  17*  GLUCOSE 85 145* 107* 100*  --  104*  BUN 32* 34* 31* 31*  --  29*  CREATININE 2.00* 2.27* 1.93* 1.90*  --  1.75*  CALCIUM 9.3 9.0 9.0 8.8*  --  8.5*  MG 1.3* 1.3* 1.8  --  1.3* 1.2*   Liver Function Tests: Recent Labs  Lab 06/13/18 0756 06/13/18 1719  AST 43* 43*  ALT 26 29  ALKPHOS 89 97  BILITOT 0.6 0.5  PROT 5.7* 7.0  ALBUMIN 2.9* 3.3*   No results for input(s): LIPASE, AMYLASE in the last 168 hours. No results for input(s): AMMONIA in the last 168 hours. Coagulation Profile: Recent Labs  Lab 06/13/18 0756 06/13/18 1719 06/14/18 0508 06/15/18 0609 06/16/18 0555  INR 2.81 2.56 2.38 2.63 2.33   CBC: Recent Labs  Lab 06/10/18 1314 06/11/18 0650 06/13/18 0756 06/13/18 1719 06/14/18 0508  WBC 7.6 6.9 7.1 7.8 7.5  NEUTROABS  --  4.6 4.9 6.2  --   HGB 10.3* 10.3* 10.7* 11.2* 11.6*  HCT 35.7* 35.1* 36.3 38.2 40.1  MCV 105.9* 105.7* 106.8* 104.1* 108.4*  PLT 379 368 339 338 330   Cardiac Enzymes: Recent Labs  Lab 06/15/18 1016  CKTOTAL 14*   BNP: Invalid input(s): POCBNP CBG: Recent Labs  Lab 06/15/18 1114 06/15/18 1615 06/15/18 2118 06/16/18 0735 06/16/18 1105  GLUCAP 116*  128* 117* 99 137*   HbA1C: No results for input(s): HGBA1C in the last 72 hours. Urine analysis:    Component Value Date/Time   COLORURINE YELLOW 06/13/2018 1825   APPEARANCEUR CLEAR 06/13/2018 1825   LABSPEC 1.009 06/13/2018 1825   PHURINE 5.0 06/13/2018 1825   GLUCOSEU NEGATIVE 06/13/2018 1825   HGBUR MODERATE (A) 06/13/2018 1825   BILIRUBINUR NEGATIVE 06/13/2018 1825   KETONESUR NEGATIVE 06/13/2018 1825   PROTEINUR NEGATIVE 06/13/2018 1825   UROBILINOGEN 0.2 06/20/2013 1832   NITRITE NEGATIVE 06/13/2018 1825   LEUKOCYTESUR NEGATIVE 06/13/2018 1825   Sepsis Labs: @LABRCNTIP (procalcitonin:4,lacticidven:4) ) Recent Results (from the past 240 hour(s))  Urine culture     Status: None   Collection Time: 06/13/18  6:30 PM  Result Value  Ref Range Status   Specimen Description   Final    URINE, CATHETERIZED Performed at [facility], [address] [place], [place] [address]    Special Requests   Final    NONE Performed at [facility], [address] [place], [place] [address]    Culture   Final    NO GROWTH Performed at [facility], [place] [address], [place], [place] [address]    Report Status 06/15/2018 FINAL  Final     Scheduled Meds: . clopidogrel  75 mg Oral Q breakfast  . diltiazem  120 mg Oral Daily  . insulin aspart  0-15 Units Subcutaneous TID WC  . insulin aspart  0-5 Units Subcutaneous QHS  . isosorbide dinitrate  5 mg Oral TID AC  . latanoprost  1 drop Both Eyes QHS  . leflunomide  20 mg Oral Daily  . magnesium chloride  2 tablet Oral TID  . [START ON 06/17/2018] metoprolol succinate  50 mg Oral Daily  . pantoprazole  40 mg Oral Daily  . PARoxetine  20 mg Oral Daily  . pravastatin  40 mg Oral QHS  . predniSONE  5 mg Oral Q breakfast  . pregabalin  50 mg Oral TID  . temazepam  30 mg Oral QHS  . timolol  1 drop Both Eyes BID  . Tofacitinib Citrate  1 tablet Oral Daily  . Warfarin - Pharmacist Dosing Inpatient   Does not apply Q24H   Continuous Infusions: . sodium chloride 100 mL/hr at 06/16/18 3267    Procedures/Studies: Ct Abdomen Pelvis Wo Contrast  Result Date: 06/12/2018 CLINICAL DATA:  73 year old who underwent laparotomy 04/30/2018 for perforated sigmoid diverticulitis and multiple intra-abdominal abscesses for which she underwent lysis of adhesions, short segment small-bowel resection and diverting loop ileostomy. She presents now with abdominal pain and fever. EXAM: CT ABDOMEN AND PELVIS WITHOUT CONTRAST TECHNIQUE: Multidetector CT imaging of the abdomen and pelvis was performed following the standard protocol without IV contrast. Oral contrast was administered. COMPARISON:  05/07/2018, 04/27/2018 and earlier. FINDINGS: Lower chest: Mild scarring and bronchiectasis involving the lower  lobes. Scarring involving the RIGHT MIDDLE LOBE. No new abnormalities involving the lung bases. Heart mildly enlarged but stable. Mitral annular calcification again noted. Hepatobiliary: Normal unenhanced appearance of the liver. Surgically absent gallbladder. No biliary ductal dilation. Pancreas: Mild diffuse atrophy. Otherwise normal unenhanced appearance. Spleen: Normal unenhanced appearance. Adrenals/Urinary Tract: Normal appearing adrenal glands. Non-obstructing approximate 4 mm calculus in a LOWER pole calyx of the RIGHT kidney. No urinary tract calculi elsewhere. No hydronephrosis. Allowing for the unenhanced technique, no focal parenchymal abnormality involving either kidney. Small diverticulum arising from the RIGHT posterolateral wall of the urinary bladder. Opaque material within the dependent  portion of the urinary bladder. Stomach/Bowel: Normal-appearing stomach containing opaque ingested tablets. Normal-appearing small bowel. Small bowel surgical anastomosis in the LEFT mid abdomen at the level of the umbilicus. Normal-appearing ileostomy in the RIGHT mid abdomen. Contrast material passes through the small bowel into the ostomy bag without obstruction. Decompressed colon with diffuse diverticulosis, including extensive descending and sigmoid colon diverticulosis. No evidence of diverticulitis. Vascular/Lymphatic: Severe aortoiliofemoral atherosclerosis without evidence of aneurysm. No pathologic lymphadenopathy. Reproductive: Normal-appearing uterus and ovaries without evidence of adnexal mass. Other: Percutaneous catheter within a completely drained abscess in the RIGHT UPPER pelvis. Surgical drain in the pelvis. No evidence of intra-abdominal or pelvic abscess currently. No ascites. Midline surgical incision without evidence of incisional hernia or abscess. Musculoskeletal: Multilevel degenerative disc disease, spondylosis and facet degenerative changes throughout the lumbar spine. Degenerative  changes involving both hips. No acute findings. IMPRESSION: 1. No acute abnormalities involving the abdomen or pelvis. 2. No evidence of intra-abdominal or pelvic abscess currently. Percutaneous catheter within a completely drained abscess in the RIGHT UPPER pelvis. 3. Non-obstructing approximate 4 mm calculus in a LOWER pole calyx of the RIGHT kidney. No urinary tract calculi elsewhere. 4. Diffuse colonic diverticulosis, including extensive descending and sigmoid colon diverticulosis. No evidence of diverticulitis. Aortic Atherosclerosis (ICD10-170.0) Electronically Signed   By: Evangeline Dakin M.D.   On: 06/12/2018 11:21   Ct Head Wo Contrast  Result Date: 06/08/2018 CLINICAL DATA:  Patient status post fall. No reported loss of consciousness. EXAM: CT HEAD WITHOUT CONTRAST CT CERVICAL SPINE WITHOUT CONTRAST TECHNIQUE: Multidetector CT imaging of the head and cervical spine was performed following the standard protocol without intravenous contrast. Multiplanar CT image reconstructions of the cervical spine were also generated. COMPARISON:  Cervical spine CT 06/27/2016 FINDINGS: CT HEAD FINDINGS Brain: Ventricles and sulci are prominent compatible with atrophy. Periventricular and subcortical white matter hypodensity compatible with chronic microvascular ischemic changes. No evidence for acute cortically based infarct, intracranial hemorrhage, mass lesion or mass-effect. Basal ganglia calcifications. Vascular: Unremarkable Skull: Intact. Sinuses/Orbits: Paranasal sinuses are well aerated. Mastoid air cells are unremarkable. Orbits are unremarkable. Other: None. CT CERVICAL SPINE FINDINGS Alignment: Normal anatomic alignment. Skull base and vertebrae: No acute fracture. No primary bone lesion or focal pathologic process. Soft tissues and spinal canal: No prevertebral fluid or swelling. No visible canal hematoma. Disc levels: Multilevel degenerative disc disease most pronounced C5-6 and C6-7. no acute fracture.  Upper chest: Unremarkable. Other: Bilateral carotid arterial vascular calcifications. IMPRESSION: No acute intracranial process. No acute cervical spine fracture. Electronically Signed   By: Lovey Newcomer M.D.   On: 06/08/2018 13:41   Ct Cervical Spine Wo Contrast  Result Date: 06/08/2018 CLINICAL DATA:  Patient status post fall. No reported loss of consciousness. EXAM: CT HEAD WITHOUT CONTRAST CT CERVICAL SPINE WITHOUT CONTRAST TECHNIQUE: Multidetector CT imaging of the head and cervical spine was performed following the standard protocol without intravenous contrast. Multiplanar CT image reconstructions of the cervical spine were also generated. COMPARISON:  Cervical spine CT 06/27/2016 FINDINGS: CT HEAD FINDINGS Brain: Ventricles and sulci are prominent compatible with atrophy. Periventricular and subcortical white matter hypodensity compatible with chronic microvascular ischemic changes. No evidence for acute cortically based infarct, intracranial hemorrhage, mass lesion or mass-effect. Basal ganglia calcifications. Vascular: Unremarkable Skull: Intact. Sinuses/Orbits: Paranasal sinuses are well aerated. Mastoid air cells are unremarkable. Orbits are unremarkable. Other: None. CT CERVICAL SPINE FINDINGS Alignment: Normal anatomic alignment. Skull base and vertebrae: No acute fracture. No primary bone lesion or focal pathologic process. Soft tissues  and spinal canal: No prevertebral fluid or swelling. No visible canal hematoma. Disc levels: Multilevel degenerative disc disease most pronounced C5-6 and C6-7. no acute fracture. Upper chest: Unremarkable. Other: Bilateral carotid arterial vascular calcifications. IMPRESSION: No acute intracranial process. No acute cervical spine fracture. Electronically Signed   By: Lovey Newcomer M.D.   On: 06/08/2018 13:41   US Renal  Result Date: 06/15/2018 CLINICAL DATA:  Acute kidney injury. EXAM: RENAL / URINARY TRACT ULTRASOUND COMPLETE COMPARISON:  CT of the abdomen on  06/12/2000 FINDINGS: Right Kidney: Renal measurements: 11.1 x 5.4 x 5.2 cm = volume: 164 mL . Echogenicity within normal limits. No mass or hydronephrosis visualized. Left Kidney: Renal measurements: 11.7 x 6.2 x 5.8 cm = volume: 219 mL. Echogenicity within normal limits. No mass or hydronephrosis visualized. Bladder: The bladder is decompressed by a Foley catheter. IMPRESSION: Unremarkable renal ultrasound demonstrating no significant renal atrophy or evidence of renal obstruction. Electronically Signed   By: Aletta Edouard M.D.   On: 06/15/2018 10:47   Dg Chest Port 1 View  Result Date: 06/10/2018 CLINICAL DATA:  PICC placement. EXAM: PORTABLE CHEST 1 VIEW COMPARISON:  04/11/2018 FINDINGS: PICC tip is in the superior vena cava at the level of the azygos vein. Heart size and vascularity are normal and the lungs are clear. CABG. No acute bone abnormality. IMPRESSION: PICC in good position. No acute cardiopulmonary findings. Electronically Signed   By: Lorriane Shire M.D.   On: 06/10/2018 17:51   Dg Chest Port 1v Same Day  Result Date: 06/10/2018 CLINICAL DATA:  PICC placement. EXAM: PORTABLE CHEST 1 VIEW 6:15 p.m. COMPARISON:  06/10/2018 at 5:24 p.m. FINDINGS: PICC tip remains unchanged in position in the superior vena cava at the level of the azygos vein. Heart size and vascularity are normal. Lungs are clear. CABG. IMPRESSION: No change in the PICC tip in the superior vena cava. No acute cardiopulmonary findings. Electronically Signed   By: Lorriane Shire M.D.   On: 06/10/2018 18:37   Dg Humerus Left  Result Date: 06/10/2018 CLINICAL DATA:  PICC line placement EXAM: LEFT HUMERUS - 2+ VIEW COMPARISON:  None. FINDINGS: There is a PICC line identified in the upper left arm in the expected course. The distal portion of the PICC line is not included on film. IMPRESSION: There is a PICC line identified in the upper left arm in the expected course. The distal portion of the PICC line is not included on  film. Electronically Signed   By: Abelardo Diesel M.D.   On: 06/10/2018 18:02   Ir Picc Replacement Leftinc Imgguide  Result Date: 05/19/2018 INDICATION: Malfunctioning PICC line. Diverticular abscess. Need to continue IV antibiotics. EXAM: FLUOROSCOPIC GUIDED PICC LINE EXCHANGE TECHNIQUE: The procedure, risks, benefits, and alternatives were explained to the patient and informed written consent was obtained. The left upper extremity and external portion of the existing PICC line was prepped with chlorhexidine in a sterile fashion, and a sterile drape was applied covering the operative field. Maximum barrier sterile technique with sterile gowns and gloves were used for the procedure. A timeout was performed prior to the initiation of the procedure. Local anesthesia was provided with 1% lidocaine. The existing PICC line was cannulated with an 0.0018 wire which was advanced through the catheter. The catheter was exchanged for a peel-away sheath, ultimately allowing advancement of a 46-cm, 5- [place], double lumen PICC line to the level of the superior caval atrial junction. A post procedure spot fluoroscopic image was obtained. The catheter  easily aspirated and flushed and was secured in place. A dressing was placed. The patient tolerated the procedure well without immediate post procedural complication. FINDINGS: After catheter exchange, the tip lies within the superior cavoatrial junction. The catheter aspirates and flushes normally, it was capped, and is ready for immediate use. IMPRESSION: Successful fluoroscopic guided exchange of left upper extremity approach 46 cm, 5 - [place], double lumen PICC with tip overlying the superior caval atrial junction. The PICC line is ready for immediate use. Read by: Gareth Eagle, PA-C Electronically Signed   By: Markus Daft M.D.   On: 05/19/2018 13:50    Orson Eva, DO  [facility] Pager [phone]  If 7PM-7AM, please contact  night-coverage [email or web address] Password [email or web address] 06/16/2018, 4:59 PM   LOS: 0 days

## 2018-06-16 NOTE — Progress Notes (Signed)
ANTICOAGULATION CONSULT NOTE - Follow Up Consult  Pharmacy Consult for warfarin dosing Indication: atrial fibrillation / Hx DVT Concurrent anti-platelet meds: Plavix   Allergies  Allergen Reactions  . Lactose Intolerance (Gi) Other (See Comments)    G.I. Upset  . Butrans [Buprenorphine] Rash and Other (See Comments)    Infected skin underneath application  . Simvastatin Rash    Patient Measurements: Height: 5\' 6"  (167.6 cm) Weight: 224 lb (101.6 kg) IBW/kg (Calculated) : 59.3 Heparin Dosing Weight:  HEPARIN DW (KG): 82.4  Vital Signs: Temp: 97.8 F (36.6 C) (01/06 0530) Temp Source: Oral (01/06 0530) BP: 128/78 (01/06 0530) Pulse Rate: 110 (01/06 0530)  Labs: Recent Labs    06/13/18 1719 06/14/18 0508 06/14/18 0802 06/15/18 0609 06/15/18 1016 06/16/18 0555  HGB 11.2* 11.6*  --   --   --   --   HCT 38.2 40.1  --   --   --   --   PLT 338 330  --   --   --   --   LABPROT 27.2* 25.7*  --  27.7*  --  25.2*  INR 2.56 2.38  --  2.63  --  2.33  CREATININE 2.27*  --  1.93* 1.90*  --  1.75*  CKTOTAL  --   --   --   --  14*  --     Estimated Creatinine Clearance: 34.4 mL/min (A) (by C-G formula based on SCr of 1.75 mg/dL (H)).   Assessment: Pharmacy consulted to dose warfarin for this 85 yof admitted for weakness. She has been on chronic warfarin therapy for  atrial fibrillation and history of DVT.  Patient's home regimen is warfarin 1mg  daily. INR remains within  goal range today at 2.33.   Goal of Therapy:  INR 2-3 Monitor platelets by anticoagulation protocol: Yes   Plan:  Give warfarin 1mg  x1 dose today (home dose) Obtain daily INR and every- other- day CBC Monitor for signs and symptoms of bleeding.  [name] 06/16/2018,1:22 PM

## 2018-06-16 NOTE — Clinical Social Work Note (Signed)
  Clinical Social Work Assessment  Patient Details  Name: [name] MRN: [id number] Date of Birth: [birth year]  Date of referral:  06/16/18               Reason for consult:  Facility Placement                Permission sought to share information with:    Permission granted to share information::     Name::        Agency::     Relationship::     Contact Information:  Husband, [name], at bedside   Housing/Transportation Living arrangements for the past 2 months:  Single Family Home Source of Information:  Patient, Spouse Patient Interpreter Needed:  None Criminal Activity/Legal Involvement Pertinent to Current Situation/Hospitalization:  No - Comment as needed Significant Relationships:    Lives with:  Adult Children, Spouse Do you feel safe going back to the place where you live?  Yes Need for family participation in patient care:  Yes (Comment)  Care giving concerns:  Spouse states that patient needs to return to short term rehab.   Social Worker assessment / plan:  Patient was discharged from [facility] on Saturday 06/14/18 after approximately 25 days of short term rehab. Patient was getting out of the car and took three steps and her knees gave out and she fell. Family was unable to pick patient up.  Patient and family are again interested in short term rehab.  Patient provided SNF listing from [facility] website.   Employment status:  Retired Forensic scientist:  Medicare PT Recommendations:  [place] / Referral to community resources:  [place]  Patient/Family's Response to care:  Patient and family are agreeable to SNF.   Patient/Family's Understanding of and Emotional Response to Diagnosis, Current Treatment, and Prognosis:  Patient and family understand patients diagnosis, treatment and prognosis, and feel patient needs to return to short term rehab.   Emotional Assessment Appearance:  Appears stated  age Attitude/Demeanor/Rapport:    Affect (typically observed):  Calm Orientation:  Oriented to Self, Oriented to Place, Oriented to Situation, Oriented to  Time Alcohol / Substance use:  Not Applicable Psych involvement (Current and /or in the community):  No (Comment)  Discharge Needs  Concerns to be addressed:  Discharge Planning Concerns Readmission within the last 30 days:  No Current discharge risk:  Chronically ill, Dependent with Mobility Barriers to Discharge:  No Barriers Identified   Ihor Gully, LCSW 06/16/2018, 12:35 PM

## 2018-06-16 NOTE — Clinical Social Work Placement (Signed)
   CLINICAL SOCIAL WORK PLACEMENT  NOTE  Date:  06/16/2018  Patient Details  Name: MAUDELL STANBROUGH MRN: [id number] Date of Birth: Jun 17, 1945  Clinical Social Work is seeking post-discharge placement for this patient at the [place] level of care (*CSW will initial, date and re-position this form in  chart as items are completed):  Yes   Patient/family provided with [place] Work Department's list of facilities offering this level of care within the geographic area requested by the patient (or if unable, by the patient's family).  Yes   Patient/family informed of their freedom to choose among providers that offer the needed level of care, that participate in Medicare, Medicaid or managed care program needed by the patient, have an available bed and are willing to accept the patient.  Yes   Patient/family informed of [facility]'s ownership interest in [facility] and [facility], as well as of the fact that they are under no obligation to receive care at these facilities.  PASRR submitted to EDS on       PASRR number received on       Existing PASRR number confirmed on 06/16/18     FL2 transmitted to all facilities in geographic area requested by pt/family on 06/16/18     FL2 transmitted to all facilities within larger geographic area on       Patient informed that his/her managed care company has contracts with or will negotiate with certain facilities, including the following:            Patient/family informed of bed offers received.  Patient chooses bed at       Physician recommends and patient chooses bed at      Patient to be transferred to   on  .  Patient to be transferred to facility by       Patient family notified on   of transfer.  Name of family member notified:        PHYSICIAN       Additional Comment:    _______________________________________________ Ihor Gully, LCSW 06/16/2018, 12:33 PM

## 2018-06-16 NOTE — Clinical Social Work Note (Signed)
Patient's [facility] waiver number is [id number].   [name], [name], LCSW

## 2018-06-16 NOTE — Evaluation (Signed)
Physical Therapy Evaluation Patient Details Name: [name] MRN: [id number] DOB: March 16, 1946 Today's Date: 06/16/2018   History of Present Illness  [name] is a 73 y.o. female with a history of diabetes, rheumatoid arthritis, fibromyalgia, GERD, hypertension, liver disease, history of stroke, recent hospitalization for diverticulitis with perforation and abscess in October 2019 until December 2019.  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  The patient still has a wound VAC in place.  Patient has been in rehab until this morning when she was finally discharged.  The patient went home, was wheeled up to her door, took 3 steps inside and her knees gave out the patient went down to the floor with assistance of the 3 people standing beside her.  She is been generally weak since then.  Denies fevers, chills, nausea, vomiting.  No palliating or provoking factors.  She needed great assistance moving from the stretcher to the hospital bed.    Clinical Impression  Patient limited for functional mobility as stated below secondary to BLE weakness, fatigue and poor standing balance. Patient limited to standing for 1-2 minutes before having to sit and declined to attempt transferring to chair due to c/o fatigue and weakness.  Patient will benefit from continued physical therapy in hospital and recommended venue below to increase strength, balance, endurance for safe ADLs and gait.     Follow Up Recommendations SNF    Equipment Recommendations  None recommended by PT    Recommendations for Other Services       Precautions / Restrictions Precautions Precautions: Fall Restrictions Weight Bearing Restrictions: No      Mobility  Bed Mobility Overal bed mobility: Needs Assistance Bed Mobility: Supine to Sit;Sit to Supine     Supine to sit: HOB  elevated;Mod assist Sit to supine: Mod assist   General bed mobility comments: required assistance to move BLE onto bed during sit to supine  Transfers Overall transfer level: Needs assistance Equipment used: Rolling walker (2 wheeled) Transfers: Sit to/from Stand Sit to Stand: Max assist         General transfer comment: very unsteady on feet  Ambulation/Gait Ambulation/Gait assistance: Max assist Gait Distance (Feet): 2 Feet Assistive device: Rolling walker (2 wheeled) Gait Pattern/deviations: Decreased step length - right;Decreased step length - left;Decreased stride length Gait velocity: slow   General Gait Details: limited to a couple short unsteady shuffling steps at bedside due to weakness  Stairs            Wheelchair Mobility    Modified Rankin (Stroke Patients Only)       Balance                                             Pertinent Vitals/Pain Pain Assessment: No/denies pain    Home Living Family/patient expects to be discharged to:: Private residence Living Arrangements: Spouse/significant other Available Help at Discharge: Family;Available PRN/intermittently Type of Home: House Home Access: Ramped entrance     Home Layout: One level Home Equipment: Wheelchair - manual;Bedside commode;Shower seat;Cane - single point;Grab bars - tub/shower;Walker - 2 wheels;Walker - 4 wheels;Other (comment)      Prior Function Level of Independence: Needs assistance   Gait / Transfers Assistance Needed: prior to surgeries was Independent community ambulator, drove,  since surgeries had been using RW for short distances, wheelchair for longer distances  ADL's / Homemaking Assistance Needed: Pt requiring assistance with ADLs at [facility] prior to discharge        Hand Dominance   Dominant Hand: Right    Extremity/Trunk Assessment   Upper Extremity Assessment Upper Extremity Assessment: Generalized weakness    Lower Extremity  Assessment Lower Extremity Assessment: Generalized weakness    Cervical / Trunk Assessment Cervical / Trunk Assessment: Normal  Communication   Communication: No difficulties  Cognition Arousal/Alertness: Awake/alert Behavior During Therapy: WFL for tasks assessed/performed Overall Cognitive Status: Within Functional Limits for tasks assessed                                        General Comments      Exercises     Assessment/Plan    PT Assessment Patient needs continued PT services  PT Problem List Decreased strength;Decreased activity tolerance;Decreased balance;Decreased mobility       PT Treatment Interventions Gait training;Stair training;Functional mobility training;Therapeutic activities;Patient/family education;Therapeutic exercise    PT Goals (Current goals can be found in the Care Plan section)  Acute Rehab PT Goals Patient Stated Goal: return home after rehab PT Goal Formulation: With patient/family Time For Goal Achievement: 06/30/18 Potential to Achieve Goals: Good    Frequency Min 3X/week   Barriers to discharge        Co-evaluation               AM-PAC PT "6 Clicks" Mobility  Outcome Measure Help needed turning from your back to your side while in a flat bed without using bedrails?: Total Help needed moving from lying on your back to sitting on the side of a flat bed without using bedrails?: Total Help needed moving to and from a bed to a chair (including a wheelchair)?: Total Help needed standing up from a chair using your arms (e.g., wheelchair or bedside chair)?: A Lot Help needed to walk in hospital room?: Total Help needed climbing 3-5 steps with a railing? : Total 6 Click Score: 7    End of Session Equipment Utilized During Treatment: Gait belt;Oxygen Activity Tolerance: Patient tolerated treatment well;Patient limited by fatigue Patient left: in bed;with call bell/phone within reach;with family/visitor  present Nurse Communication: Mobility status PT Visit Diagnosis: Unsteadiness on feet (R26.81);Other abnormalities of gait and mobility (R26.89);Muscle weakness (generalized) (M62.81)    Time: 6761-9509 PT Time Calculation (min) (ACUTE ONLY): 37 min   Charges:   PT Evaluation $PT Eval Moderate Complexity: 1 Mod PT Treatments $Therapeutic Activity: 23-37 mins        12:16 PM, 06/16/18 Lonell Grandchild, MPT Physical Therapist with [facility] 336 [phone] office [phone] mobile phone

## 2018-06-16 NOTE — Care Management Note (Signed)
Case Management Note  Patient Details  Name: [name] MRN: [id number] Date of Birth: 1945-12-14  Subjective/Objective:       Weakness, Afib. From home, left [facility] recently by her choice. Fall at home. Will need SNF again. CSW aware and will meet with patient.        Patient is active with [facility] for PT, aide, and RN services.        Action/Plan: Anticipate DC back to SNF.   Expected Discharge Date:   06/18/2018               Expected Discharge Plan:  Skilled Nursing Facility  In-House Referral:  Clinical Social Work  Discharge planning Services  CM Consult  Post Acute Care Choice:    Choice offered to:     DME Arranged:    DME Agency:     HH Arranged:    [place] Agency:     Status of Service:  Completed, signed off  If discussed at [employer], dates discussed:    Additional Comments:  Zykera Abella, Chauncey Reading, RN 06/16/2018, 12:00 PM

## 2018-06-16 NOTE — Consult Note (Addendum)
[place] Nurse wound consult note Pt is familiar to [facility] team from recent admission, when she had ileostomy surgery performed on 11/20 and a Vac applied to the surgical site, refer to previous consult on 12/2.  Pt has been staying at the [facility], then discharged to home for a day, according to the husband at the bedside, before being re-admitted.  She is currently on a Medella negative pressure device and they state she was being followed by [facility] and it is this agencies' machine.  Informed them it is hospital policy to place the patient on our hospital negative pressure device while she is inpatient and they verbalized understanding.  Her current machine is charging and left at the bedside.  The care manager should call [place] upon discharge, so they can convert the patient back to her [place].   Reason for Consult: Vac dressing change to midline post-op full thickness wound Wound bed: 20X3X3cm, beefy red Drainage (amount, consistency, odor) small amt tan drainage, no odor Periwound: intact skin surrounding Dressing procedure/placement/frequency: Applied one piece black foam to wound, then attached to158mm cont suction.  Pt denied offer of meds and tolerated without c/o pain. Plan for bedside nurse to change Q M/W/F.   WOC Nurse ostomy consult note Stoma type/location: Ileostomy to RLQ Stomal assessment/size: Stoma is red and viable, slightly above skin level, 1 1/4 inches Peristomal assessment: intact skin usrrounding Output: mod amt liquid brown stool Ostomy pouching: 1pc. Applied one piece convex pouch and barrier ring to maintain seal,  2 sets of pouches and barrier rings left at the bedside for staff nurse use.  Discussed plan of care with patient and husband at the bedside.  Please re-consult if further assistance is needed.  Thank-you,  Julien Girt MSN, [place], [place], [place], [place]

## 2018-06-16 NOTE — Evaluation (Signed)
Occupational Therapy Evaluation Patient Details Name: [name] MRN: [id number] DOB: Sep 16, 1945 Today's Date: 06/16/2018    History of Present Illness [name] is a 73 y.o. female with a history of diabetes, rheumatoid arthritis, fibromyalgia, GERD, hypertension, liver disease, history of stroke, recent hospitalization for diverticulitis with perforation and abscess in October 2019 until December 2019.  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  The patient still has a wound VAC in place.  Patient has been in rehab until this morning when she was finally discharged.  The patient went home, was wheeled up to her door, took 3 steps inside and her knees gave out the patient went down to the floor with assistance of the 3 people standing beside her.  She is been generally weak since then.  Denies fevers, chills, nausea, vomiting.  No palliating or provoking factors.  She needed great assistance moving from the stretcher to the hospital bed.   Clinical Impression   Pt received supine in bed, husband present, agreeable to OT evaluation. Pt has extensive medical history, just discharged after a month long stay at [facility]. On Christmas Eve and Christmas day pt went home for 4 hours and did well getting into and out of home. However on discharge from [facility] pt legs buckled when entering house. During evaluation pt with extensive weakness, able to achieve sitting at EOB however unable to maintain upright posture for greater than 2 minutes. Pt requesting to return to supine after approximately 5 minutes due to fatigue. Pt is currently requiring significant assistance with ADLs due to weakness and poor activity tolerance. Husband is unable to provide necessary level of physical assistance required at this time, states pt needs to be able to sit up and transfer to [facility] at the  minimum before returning home. Recommend SNF on discharge to improve independence and safety during ADL completion, as well as improve strength required for ADL tasks.     Follow Up Recommendations  SNF    Equipment Recommendations  None recommended by OT       Precautions / Restrictions Precautions Precautions: Fall Restrictions Weight Bearing Restrictions: No      Mobility Bed Mobility Overal bed mobility: Needs Assistance Bed Mobility: Supine to Sit;Sit to Supine     Supine to sit: Mod assist;HOB elevated Sit to supine: Mod assist;HOB elevated   General bed mobility comments: Increased time and labored movement. Pt able to use BUE to assist with scooting to [facility], OT and husband providing max +2 assist for scooting.   Transfers                 General transfer comment: Unable to complete        ADL either performed or assessed with clinical judgement   ADL Overall ADL's : Needs assistance/impaired Eating/Feeding: Modified independent;Bed level   Grooming: Set up;Bed level               Lower Body Dressing: Total assistance;Bed level                 General ADL Comments: Pt is able to complete some ADLs with set-up at bed level. Is unable to maintain sitting posture/balance long enough to complete in sitting. Pt is unable to stand at this time.      Vision Baseline Vision/History: Wears glasses Wears Glasses: At all times Patient Visual Report: No change  from baseline Vision Assessment?: No apparent visual deficits            Pertinent Vitals/Pain Pain Assessment: No/denies pain     Hand Dominance Right   Extremity/Trunk Assessment Upper Extremity Assessment Upper Extremity Assessment: Generalized weakness(BUE grossly 4-/5 )   Lower Extremity Assessment Lower Extremity Assessment: Defer to PT evaluation   Cervical / Trunk Assessment Cervical / Trunk Assessment: Normal   Communication Communication Communication: No  difficulties   Cognition Arousal/Alertness: Awake/alert Behavior During Therapy: WFL for tasks assessed/performed Overall Cognitive Status: Within Functional Limits for tasks assessed                                                Home Living Family/patient expects to be discharged to:: Skilled nursing facility Living Arrangements: Spouse/significant other Available Help at Discharge: Family;Available PRN/intermittently Type of Home: House Home Access: Ramped entrance     Home Layout: One level     Bathroom Shower/Tub: Teacher, early years/pre: Standard     Home Equipment: Wheelchair - manual;Bedside commode;Shower seat;Cane - single point;Grab bars - tub/shower;Walker - 2 wheels;Walker - 4 wheels;Other (comment)(lift chair)          Prior Functioning/Environment Level of Independence: Needs assistance  Gait / Transfers Assistance Needed: Per family report pt able to transfer bed/wheelchair/car and use RW to ambulate from door to living room chair on Christmas day with minimal difficulty ADL's / Homemaking Assistance Needed: Pt requiring assistance with ADLs at [facility] prior to discharge            OT Problem List: Decreased strength;Decreased activity tolerance;Impaired balance (sitting and/or standing);Decreased safety awareness;Decreased knowledge of use of DME or AE      OT Treatment/Interventions: Self-care/ADL training;Therapeutic exercise;DME and/or AE instruction;Therapeutic activities;Patient/family education    OT Goals(Current goals can be found in the care plan section) Acute Rehab OT Goals Patient Stated Goal: to improve my strength so I can go home OT Goal Formulation: With patient Time For Goal Achievement: 06/30/18 Potential to Achieve Goals: Good  OT Frequency: Min 2X/week   Barriers to D/C: Decreased caregiver support  Family unable to provide necessary level of physical assistance required at this time.           AM-PAC OT "6 Clicks" Daily Activity     Outcome Measure Help from another person eating meals?: None Help from another person taking care of personal grooming?: A Little Help from another person toileting, which includes using toliet, bedpan, or urinal?: Total Help from another person bathing (including washing, rinsing, drying)?: Total Help from another person to put on and taking off regular upper body clothing?: Total Help from another person to put on and taking off regular lower body clothing?: A Lot 6 Click Score: 12   End of Session Equipment Utilized During Treatment: Oxygen  Activity Tolerance: Patient tolerated treatment well Patient left: in bed;with call bell/phone within reach;with bed alarm set;with family/visitor present  OT Visit Diagnosis: Repeated falls (R29.6);Muscle weakness (generalized) (M62.81)                Time: 3662-9476 OT Time Calculation (min): 33 min Charges:  OT General Charges $OT Visit: 1 Visit OT Evaluation $OT Eval High Complexity: 1 High   Guadelupe Sabin, OTR/L  [phone] 06/16/2018, 8:38 AM

## 2018-06-16 NOTE — Consult Note (Addendum)
Cardiology Consultation:   Patient ID: [name] MRN: [id number]; DOB: 1945/06/12  Admit date: 06/13/2018 Date of Consult: 06/16/2018  Primary Care Provider: [name], MD Primary Cardiologist: Adrian Prows, MD   Primary Electrophysiologist:  None     Patient Profile:   [name] is a 73 y.o. female with a hx of CAD who is being seen today for the evaluation of Afib at the request of Dr. Carles Collet.  History of Present Illness:   [name] is a 73 yo female with history of CAD S/P CABG 1986 [facility], Lexiscan 2012 low risk normal EF, morbid obesity, TIA 2012. Seen by Dr. Einar Gip in 2015-Echo Hyperdynamic LV moderate RV dilatation and RV systolic function preserved. Also HLD, and DM type 2. Long hospitalization 04/05/18-05/13/18 with perforated diverticulitis. Discharged from [facility] 06/13/17 and had a mechanical fall and brought back to ER.  Also has hyperlipidemia, diabetes mellitus type 2, hypertension.  Patient in ER 06/06/17 with Afib with RVR converted to NSR with a dose of diltiazem and sent back to [facility]. In ER 06/13/18 Afib with RVR improved with IV diltiazem. Mg 1.3 and Crt 2.27.  Patient is asymptomatic with her atrial fibrillation.  She sees Dr. Einar Gip regularly and says she is due to see him now.  Last office visit was in June.  Those records are unavailable.  She says she is never had atrial fibrillation that she is aware of.  She is just weak from being in bed for 2 months.  Denies chest pain.  Past Medical History:  Diagnosis Date  . Anginal pain [facility])    sees Dr Einar Gip.   . Arthritis    rheumatoid ..   . Diabetes mellitus without complication (HCC)    Metformin 2.3.2017  . Diverticulitis   . Diverticulitis of large intestine with perforation and abscess 04/05/2018  . Dysrhythmia   . Fibromyalgia   . GERD (gastroesophageal reflux disease)   . High cholesterol   . Hypertension   . Liver disease 08- 2012   per Dr Dagmar Hait pt has enlarged liver  . Peripheral vascular  disease (HCC)    legs  . Pneumonia 06/17/2016  . Sleep apnea    sleep study  oct 2012  . Stroke [facility]) 2011   [facility] [place]      Past Surgical History:  Procedure Laterality Date  . [place]  . CHOLECYSTECTOMY  1996  . COLONOSCOPY N/A 02/13/2018   Procedure: COLONOSCOPY;  Surgeon: Rogene Houston, MD;  [facility];  Service: Endoscopy;  Laterality: N/A;  8:30  . COLOSTOMY    . CORONARY ARTERY BYPASS GRAFT  1986  . ESOPHAGEAL DILATION N/A 01/13/2018   Procedure: ESOPHAGEAL DILATION;  Surgeon: Rogene Houston, MD;  [facility];  Service: Endoscopy;  Laterality: N/A;  . ESOPHAGOGASTRODUODENOSCOPY N/A 01/13/2018   Procedure: ESOPHAGOGASTRODUODENOSCOPY (EGD);  Surgeon: Rogene Houston, MD;  [facility];  Service: Endoscopy;  Laterality: N/A;  . EYE SURGERY  2013   cat ext bilateral .  . EYE SURGERY  2002   laser  . FOREIGN BODY REMOVAL  02/13/2018   Procedure: FOREIGN BODY REMOVAL;  Surgeon: Rogene Houston, MD;  [facility];  Service: Endoscopy;;  foreign body removal which appears to be food debri in a stem form removed from colon by DR. Rehman  . [place]- 2007   rod right leg - right wrist plate  .  GAS/FLUID EXCHANGE  03/25/2012   Procedure: GAS/FLUID EXCHANGE;  Surgeon: Hayden Pedro, MD;  Location: [place];  Service: Ophthalmology;  Laterality: Right;  . LAPAROTOMY N/A 04/30/2018   Procedure: EXPLORATORY LAPAROTOMY  LYSIS OF ADHESIONS SMALL BOWEL RESECTION WITH ANASTOMSIS LOOP ILEOSTOMY PLACEMENT OF DRAINS AND WOUND [place];  Surgeon: Donnie Mesa, MD;  Location: [place];  Service: General;  Laterality: N/A;  . PARS PLANA VITRECTOMY  03/25/2012   Procedure: PARS PLANA VITRECTOMY WITH 25 GAUGE;  Surgeon: Hayden Pedro, MD;  Location: [place];  Service: Ophthalmology;  Laterality: Right;     Home Medications:  Prior to Admission medications   Medication Sig Start Date End Date Taking? Authorizing  Provider  clopidogrel (PLAVIX) 75 MG tablet Take 1 tablet (75 mg total) by mouth daily with breakfast. 01/14/18  Yes Rehman, Mechele Dawley, MD  Dexlansoprazole 30 MG capsule Take 30 mg by mouth daily.   Yes [provider]  diltiazem (CARDIZEM CD) 120 MG 24 hr capsule Take 1 capsule (120 mg total) by mouth daily. 05/13/18 05/13/19 Yes Thurnell Lose, MD  ferrous sulfate 325 (65 FE) MG tablet Take 1 tablet (325 mg total) by mouth 2 (two) times daily with a meal. 05/13/18  Yes Thurnell Lose, MD  HYDROcodone-acetaminophen [facility]) 10-325 MG tablet Take 1 tablet by mouth 4 times a day as needed 06/13/18  Yes Lassen, Arlo C, PA-C  insulin aspart (NOVOLOG FLEXPEN) 100 UNIT/ML FlexPen Give per sliding scale 2 times a day, 140-199 - 2 units, 200-250 - 4 units, 251-299 - 6 units,  300-349 - 8 units,  350 or above 10 units. Dispense syringes and needles as needed, Ok to switch to PEN if approved. Substitute to any brand approved. DX DM2, Code E11.65   Yes [provider]  isosorbide dinitrate (ISORDIL) 5 MG tablet Take 1 tablet (5 mg total) by mouth 3 (three) times daily before meals. 02/07/18  Yes Rehman, Mechele Dawley, MD  leflunomide (ARAVA) 20 MG tablet Take 20 mg by mouth daily.   Yes [provider]  Magnesium 200 MG TABS Take 1 tablet by mouth daily.   Yes [provider]  metoprolol succinate (TOPROL-XL) 25 MG 24 hr tablet Take 25 mg by mouth daily.   Yes [provider]  PARoxetine (PAXIL) 20 MG tablet Take 20 mg by mouth daily.   Yes [provider]  potassium chloride SA (K-DUR,KLOR-CON) 20 MEQ tablet Take 20 mEq by mouth daily.   Yes [provider]  pravastatin (PRAVACHOL) 40 MG tablet Take 40 mg by mouth at bedtime.   Yes [provider]  predniSONE (DELTASONE) 5 MG tablet Take 5 mg by mouth daily with breakfast.   Yes [provider]  pregabalin (LYRICA) 50 MG capsule Take 1 capsule (50 mg total) by mouth 3 (three) times daily.  05/26/18  Yes Lassen, Arlo C, PA-C  SLOW-MAG 71.5-119 MG TBEC SR tablet Give 143 mg by mouth three times a day   Yes [provider]  Tafluprost (ZIOPTAN) 0.0015 % SOLN Place 1 drop into both eyes at bedtime.    Yes [provider]  temazepam (RESTORIL) 30 MG capsule Take 1 capsule (30 mg total) by mouth at bedtime. 05/14/18  Yes Lassen, Arlo C, PA-C  timolol (BETIMOL) 0.5 % ophthalmic solution Place 1 drop into both eyes 2 (two) times daily.    Yes [provider]  Tofacitinib Citrate (XELJANZ) 5 MG TABS Take 1 tablet by mouth daily.  Yes [provider]  Vitamin D, Ergocalciferol, (DRISDOL) 1.25 MG (50000 UT) CAPS capsule Take 50,000 Units by mouth every Thursday.    Yes [provider]  warfarin (COUMADIN) 1 MG tablet Take 1.5 mg by mouth every evening.    Yes [provider]  diclofenac sodium (VOLTAREN) 1 % GEL Apply 2 g topically 4 (four) times daily as needed (Pain).     [provider]    Inpatient Medications: Scheduled Meds: . clopidogrel  75 mg Oral Q breakfast  . diltiazem  120 mg Oral Daily  . insulin aspart  0-15 Units Subcutaneous TID WC  . insulin aspart  0-5 Units Subcutaneous QHS  . isosorbide dinitrate  5 mg Oral TID AC  . latanoprost  1 drop Both Eyes QHS  . leflunomide  20 mg Oral Daily  . magnesium oxide  200 mg Oral BID  . metoprolol succinate  25 mg Oral Daily  . pantoprazole  40 mg Oral Daily  . PARoxetine  20 mg Oral Daily  . pravastatin  40 mg Oral QHS  . predniSONE  5 mg Oral Q breakfast  . pregabalin  50 mg Oral TID  . temazepam  30 mg Oral QHS  . timolol  1 drop Both Eyes BID  . Tofacitinib Citrate  1 tablet Oral Daily  . Warfarin - Pharmacist Dosing Inpatient   Does not apply Q24H   Continuous Infusions: . sodium chloride 100 mL/hr at 06/16/18 0610   PRN Meds: acetaminophen **OR** acetaminophen, HYDROcodone-acetaminophen, ondansetron **OR** ondansetron (ZOFRAN) IV  Allergies:      Allergies  Allergen Reactions  . Lactose Intolerance (Gi) Other (See Comments)    G.I. Upset  . Butrans [Buprenorphine] Rash and Other (See Comments)    Infected skin underneath application  . Simvastatin Rash    Social History:   Social History   Socioeconomic History  . Marital status: Married    Spouse name: Herbie [place]  . Number of children: 2  . Years of education: 37  . Highest education level: Not on file  Occupational History  . Occupation: Retired  Scientific laboratory technician  . Financial resource strain: Not on file  . Food insecurity:    Worry: Not on file    Inability: Not on file  . Transportation needs:    Medical: Not on file    Non-medical: Not on file  Tobacco Use  . Smoking status: Former Smoker    Packs/day: 0.25    Years: 5.00    Pack years: 1.25    Types: Cigarettes    Last attempt to quit: 06/11/1984    Years since quitting: 34.0  . Smokeless tobacco: Never Used  Substance and Sexual Activity  . Alcohol use: Yes    Comment: occ  . Drug use: No    Types: Hydrocodone  . Sexual activity: Yes  Lifestyle  . Physical activity:    Days per week: Not on file    Minutes per session: Not on file  . Stress: Not on file  Relationships  . Social connections:    Talks on phone: Not on file    Gets together: Not on file    Attends religious service: Not on file    Active member of club or organization: Not on file    Attends meetings of clubs or organizations: Not on file    Relationship status: Not on file  . Intimate partner violence:    Fear of current or ex partner: Not on file  Emotionally abused: Not on file    Physically abused: Not on file    Forced sexual activity: Not on file  Other Topics Concern  . Not on file  Social History Narrative  . Not on file    Family History:    Family History  Problem Relation Age of Onset  . Hypertension Daughter   . Rheum arthritis Maternal Grandmother      ROS:  Please see the history of present illness.   Review of Systems  Constitution: Positive for malaise/fatigue.  HENT: Negative.   Eyes: Negative.   Cardiovascular: Negative.   Respiratory: Negative.   Hematologic/Lymphatic: Negative.   Musculoskeletal: Positive for muscle weakness and stiffness. Negative for joint pain.  Gastrointestinal: Negative.        Ostomy bag   Genitourinary: Negative.   Neurological: Positive for difficulty with concentration, loss of balance and weakness.    All other ROS reviewed and negative.     Physical Exam/Data:   Vitals:   06/15/18 1408 06/15/18 2116 06/16/18 0530 06/16/18 0922  BP: 97/77 (!) 124/55 128/78   Pulse: 90 94 (!) 110   Resp: 16 18 16    Temp: 98.3 F (36.8 C) 98.1 F (36.7 C) 97.8 F (36.6 C)   TempSrc: Oral Oral Oral   SpO2: 98% 99% 100% 98%  Weight:      Height:        Intake/Output Summary (Last 24 hours) at 06/16/2018 1118 Last data filed at 06/16/2018 0900 Gross per 24 hour  Intake 1706.83 ml  Output 1500 ml  Net 206.83 ml   Filed Weights   06/13/18 1650  Weight: 101.6 kg   Body mass index is 36.15 kg/m.  General: Obese, in no acute distress  HEENT: normal Lymph: no adenopathy Neck: no JVD Endocrine:  No thryomegaly Vascular: No carotid bruits; FA pulses 2+ bilaterally without bruits  Cardiac: Irregular irregular at 120 bpm, distant heart sounds, 1/6 systolic murmur left sternal border Lungs: Few crackles at right base abd: soft, nontender, no hepatomegaly  Ext: no edema Musculoskeletal:  No deformities, BUE and BLE strength normal and equal Skin: warm and dry  Neuro:  CNs 2-12 intact, no focal abnormalities noted Psych:  Normal affect   EKG:  The EKG was personally reviewed and demonstrates: Atrial fibrillation at 121 bpm with low voltage Telemetry:  Telemetry was personally reviewed and demonstrates: Atrial fibrillation at 121 bpm  Relevant CV Studies: 2D echo 10/31/2019Study Conclusions   - Left ventricle: The cavity size was normal. Wall thickness  was   increased in a pattern of moderate LVH. Systolic function was   vigorous. The estimated ejection fraction was in the range of 65%   to 70%. Indeterminant diastolic function. Wall motion was normal;   there were no regional wall motion abnormalities. - Aortic valve: Valve area (VTI): 2.74 cm^2. Valve area (Vmax):   2.58 cm^2. Valve area (Vmean): 2.56 cm^2.   Lexiscan Stress: 02/16/11: Non diagnostic EKG. Very small apical ischemia vs gut artifact. Normal EF. Low risk stress.  Echo 1/12: normal echocardiogram. Poor echo window. Echo 06/22/2013: Hyperdynamic LV. Mod. RV dilatation. No significant valvular abnormality. Poor echo window.    Laboratory Data:  Chemistry Recent Labs  Lab 06/14/18 0802 06/15/18 0609 06/16/18 0555  NA 140 138 139  K 5.1 4.7 4.6  CL 116* 112* 117*  CO2 17* 18* 17*  GLUCOSE 107* 100* 104*  BUN 31* 31* 29*  CREATININE 1.93* 1.90* 1.75*  CALCIUM 9.0 8.8* 8.5*  GFRNONAA 25* 26* 28*  GFRAA 29* 30* 33*  ANIONGAP 7 8 5     Recent Labs  Lab 06/13/18 0756 06/13/18 1719  PROT 5.7* 7.0  ALBUMIN 2.9* 3.3*  AST 43* 43*  ALT 26 29  ALKPHOS 89 97  BILITOT 0.6 0.5   Hematology Recent Labs  Lab 06/13/18 0756 06/13/18 1719 06/14/18 0508  WBC 7.1 7.8 7.5  RBC 3.40* 3.67* 3.70*  HGB 10.7* 11.2* 11.6*  HCT 36.3 38.2 40.1  MCV 106.8* 104.1* 108.4*  MCH 31.5 30.5 31.4  MCHC 29.5* 29.3* 28.9*  RDW 19.7* 19.4* 19.2*  PLT 339 338 330   Cardiac EnzymesNo results for input(s): TROPONINI in the last 168 hours. No results for input(s): TROPIPOC in the last 168 hours.  BNPNo results for input(s): BNP, PROBNP in the last 168 hours.  DDimer No results for input(s): DDIMER in the last 168 hours.  Radiology/Studies:  US Renal  Result Date: 06-30-2018 CLINICAL DATA:  Acute kidney injury. EXAM: RENAL / URINARY TRACT ULTRASOUND COMPLETE COMPARISON:  CT of the abdomen on 06/12/2000 FINDINGS: Right Kidney: Renal measurements: 11.1 x 5.4 x 5.2 cm = volume: 164 mL .  Echogenicity within normal limits. No mass or hydronephrosis visualized. Left Kidney: Renal measurements: 11.7 x 6.2 x 5.8 cm = volume: 219 mL. Echogenicity within normal limits. No mass or hydronephrosis visualized. Bladder: The bladder is decompressed by a Foley catheter. IMPRESSION: Unremarkable renal ultrasound demonstrating no significant renal atrophy or evidence of renal obstruction. Electronically Signed   By: Aletta Edouard M.D.   On: 30-Jun-2018 10:47    Assessment and Plan:   1. Atrial fibrillation with RVR at 121 bpm.  Also had atrial fibrillation with RVR in the ER 05/2018 converted with diltiazem.  CHA2DS2-VASc equals 7.  Echo 03/2018 normal LV EF 65 to 70% with moderate LVH. Reports of slow heart rates, but I can only review tele from June 30, 2018 and all are fast. On Coumadin,diltiazem 120 mg daily, toprol 25 mg. Will increase toprol 50 mg daily for better rate control 2. History of CVA was on plavix now on coumadin 3. CAD status post CABG 1986 4. Perforated diverticulitis with abscess and prolonged hospitalization October through December with recent rehab stay and deconditioning 5. Hypertension 6. Hyperlipidemia      For questions or updates, please contact [place] Please consult [email or web address] for contact info under     Signed, Ermalinda Barrios, PA-C  06/16/2018 11:18 AM   The patient was seen and examined, and I agree with the history, physical exam, assessment and plan as documented above, with modifications as noted below. I have also personally reviewed all relevant documentation, old records, labs, and both radiographic and cardiovascular studies. I have also independently interpreted old and new ECG's.  I spoke with both the patient and her husband.  She is a 89 year old woman with a history of coronary artery disease and CABG in 1986.  She underwent a low risk nuclear stress test in 2012.  She also has a prior history of TIA.  She normally follows with Dr. Einar Gip and  at her last visit with him, he said she could follow-up in 1 year.  Most recent cardiology documentation in the EMR is from 2015.  She was hospitalized in the latter part of 2019 with perforated diverticulitis and then went to the [facility].  She presented to the ED on 06/06/2018 with rapid atrial fibrillation and converted to sinus rhythm with a dose of diltiazem.  She again presented  to the ED on 06/13/2018 with rapid atrial fibrillation.  She currently denies chest pain, palpitations, and shortness of breath.  Her primary complaint relates to generalized weakness.  Heart rates remain elevated.  We will increase Toprol-XL to 50 mg daily as she is tachycardic at the time of my exam.  She is also on long-acting diltiazem 120 mg daily.  In terms of antiplatelet therapy, it appears she is on Plavix.  This is likely due to prior history of TIA along with coronary artery disease.  She is anticoagulated with warfarin.  INR 2.33 today.  Creatinine is 1.75 but improved from a peak of 2.27 on 06/13/2018.  Magnesium is low at 1.2 and needs to be replaced.  We will reassess on 06/17/2018.   Kate Sable, MD, [facility]  06/16/2018 11:35 AM

## 2018-06-16 NOTE — NC FL2 (Signed)
[place] LEVEL OF CARE SCREENING TOOL     IDENTIFICATION  Patient Name: [name] Birthdate: 09-16-1945 Sex: female Admission Date (Current Location): 06/13/2018  [facility]  and [place] Number:  [employer] and Address:  [place] [address], [place]      Provider Number: [id number]  Attending Physician Name and Address:  Orson Eva, MD  Relative Name and Phone Number:       Current Level of Care: Hospital Recommended Level of Care: [place] Prior Approval Number:    Date Approved/Denied:   PASRR Number: [id number]  Discharge Plan: SNF    Current Diagnoses: Patient Active Problem List   Diagnosis Date Noted  . Atrial fibrillation with RVR ([place]) 06/15/2018  . Generalized weakness 06/15/2018  . Dehydration 06/14/2018  . Weakness 06/14/2018  . AKI (acute kidney injury) ([place]) 06/13/2018  . 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 Muscular deconditioning 06/13/2018  . Hypomagnesemia 06/13/2018  . Abscess of sigmoid colon due to diverticulitis   . Fibromyalgia 04/06/2018  . Diverticulitis of large intestine with perforation and abscess 04/05/2018  . Anemia 04/05/2018  . Esophageal dysphagia 01/03/2018  . Rheumatoid arthritis ([place]) 06/27/2016  . Carpal tunnel syndrome 03/24/2014  . Paresthesias/numbness 03/24/2014  . Obesity, unspecified 09/23/2013  . Pulmonary nodules 07/03/2013  . TIA (transient ischemic attack) 06/20/2013  . Left arm weakness 06/20/2013  . DM (diabetes mellitus) ([place]) 06/20/2013  . HTN (hypertension) 06/20/2013  . Chronic pain 06/20/2013  . Vitreous hemorrhage ([place]) 03/25/2012    Orientation RESPIRATION BLADDER Height & Weight     Self, Time, Situation, Place  O2(1L) Urostomy Weight: 224 lb (101.6 kg) Height:  5\' 6"  (167.6 cm)  BEHAVIORAL SYMPTOMS/MOOD NEUROLOGICAL BOWEL NUTRITION STATUS      Ileostomy Diet(heart healthy)  AMBULATORY STATUS COMMUNICATION OF NEEDS Skin    Extensive Assist Verbally Surgical wounds(negative pressure wound therapy abdomen. Closed system drain left abdomen. Closed system drain right lateral abdomen)                       Personal Care Assistance Level of Assistance  Bathing, Feeding, Dressing Bathing Assistance: Maximum assistance Feeding assistance: Independent Dressing Assistance: Maximum assistance     Functional Limitations Info  Sight, Hearing, Speech Sight Info: Adequate Hearing Info: Adequate Speech Info: Adequate    SPECIAL CARE FACTORS FREQUENCY  PT (By licensed PT), OT (By licensed OT)     PT Frequency: 5x/week OT Frequency: 3x/week            Contractures Contractures Info: Not present    Additional Factors Info  Allergies, Code Status, Psychotropic Code Status Info: Full Code Allergies Info: lactose intolerance, butrans, simvastatin Psychotropic Info: Paxil         Current Medications (06/16/2018):  This is the current hospital active medication list Current Facility-Administered Medications  Medication Dose Route Frequency Provider Last Rate Last Dose  . 0.9 %  sodium chloride infusion   Intravenous Continuous Roney Jaffe, MD 100 mL/hr at 06/16/18 0610    . acetaminophen (TYLENOL) tablet 650 mg  650 mg Oral Q6H PRN Truett Mainland, DO       Or  . acetaminophen (TYLENOL) suppository 650 mg  650 mg Rectal Q6H PRN Truett Mainland, DO      . clopidogrel (PLAVIX) tablet 75 mg  75 mg Oral Q breakfast Truett Mainland, DO   75 mg at 06/16/18 0841  . diltiazem (CARDIZEM CD)  24 hr capsule 120 mg  120 mg Oral Daily Truett Mainland, DO   120 mg at 06/16/18 0840  . HYDROcodone-acetaminophen (NORCO) 10-325 MG per tablet 1 tablet  1 tablet Oral Q6H PRN Truett Mainland, DO      . insulin aspart (novoLOG) injection 0-15 Units  0-15 Units Subcutaneous TID WC Truett Mainland, DO   2 Units at 06/16/18 1132  . insulin aspart (novoLOG) injection 0-5 Units  0-5 Units Subcutaneous QHS Stinson, Jacob  J, DO      . isosorbide dinitrate (ISORDIL) tablet 5 mg  5 mg Oral TID AC Truett Mainland, DO   5 mg at 06/16/18 1132  . latanoprost (XALATAN) 0.005 % ophthalmic solution 1 drop  1 drop Both Eyes QHS Truett Mainland, DO   1 drop at 06/14/18 0043  . leflunomide (ARAVA) tablet 20 mg  20 mg Oral Daily Truett Mainland, DO   20 mg at 06/16/18 3875  . magnesium oxide (MAG-OX) tablet 200 mg  200 mg Oral BID Roney Jaffe, MD   200 mg at 06/16/18 0840  . [START ON 06/17/2018] metoprolol succinate (TOPROL-XL) 24 hr tablet 50 mg  50 mg Oral Daily Ermalinda Barrios M, PA-C      . ondansetron [facility]) tablet 4 mg  4 mg Oral Q6H PRN Truett Mainland, DO       Or  . ondansetron [facility]) injection 4 mg  4 mg Intravenous Q6H PRN Truett Mainland, DO      . pantoprazole (PROTONIX) EC tablet 40 mg  40 mg Oral Daily Truett Mainland, DO   40 mg at 06/16/18 0841  . PARoxetine (PAXIL) tablet 20 mg  20 mg Oral Daily Truett Mainland, DO   20 mg at 06/16/18 0840  . pravastatin (PRAVACHOL) tablet 40 mg  40 mg Oral QHS Truett Mainland, DO   40 mg at 06/15/18 2225  . predniSONE (DELTASONE) tablet 5 mg  5 mg Oral Q breakfast Truett Mainland, DO   5 mg at 06/16/18 0841  . pregabalin (LYRICA) capsule 50 mg  50 mg Oral TID Truett Mainland, DO   50 mg at 06/16/18 0841  . temazepam (RESTORIL) capsule 30 mg  30 mg Oral QHS Truett Mainland, DO   30 mg at 06/15/18 2225  . timolol (BETIMOL) 0.5 % ophthalmic solution 1 drop  1 drop Both Eyes BID Truett Mainland, DO   1 drop at 06/16/18 0843  . Tofacitinib Citrate TABS 5 mg  1 tablet Oral Daily Truett Mainland, DO   5 mg at 06/14/18 0941  . Warfarin - Pharmacist Dosing Inpatient   Does not apply Q24H Truett Mainland, DO         Discharge Medications: Please see discharge summary for a list of discharge medications.  Relevant Imaging Results:  Relevant Lab Results:   Additional Information SSN: [id number]  Ihor Gully, LCSW

## 2018-06-16 NOTE — Plan of Care (Signed)
  Problem: Acute Rehab PT Goals(only PT should resolve) Goal: Pt Will Go Supine/Side To Sit Outcome: Progressing Flowsheets (Taken 06/16/2018 1219) Pt will go Supine/Side to Sit: with min guard assist Goal: Patient Will Transfer Sit To/From Stand Outcome: Progressing Flowsheets (Taken 06/16/2018 1219) Patient will transfer sit to/from stand: with moderate assist Goal: Pt Will Transfer Bed To Chair/Chair To Bed Outcome: Progressing Flowsheets (Taken 06/16/2018 1219) Pt will Transfer Bed to Chair/Chair to Bed: with mod assist Goal: Pt Will Ambulate Outcome: Progressing Flowsheets (Taken 06/16/2018 1219) Pt will Ambulate: 25 feet; with minimal assist; with moderate assist; with rolling walker   12:20 PM, 06/16/18 Lonell Grandchild, MPT Physical Therapist with [facility] [phone] office [phone] mobile phone

## 2018-06-17 DIAGNOSIS — B9689 Other specified bacterial agents as the cause of diseases classified elsewhere: Secondary | ICD-10-CM | POA: Diagnosis present

## 2018-06-17 DIAGNOSIS — G4733 Obstructive sleep apnea (adult) (pediatric): Secondary | ICD-10-CM | POA: Diagnosis not present

## 2018-06-17 DIAGNOSIS — I25708 Atherosclerosis of coronary artery bypass graft(s), unspecified, with other forms of angina pectoris: Secondary | ICD-10-CM | POA: Diagnosis not present

## 2018-06-17 DIAGNOSIS — E872 Acidosis: Secondary | ICD-10-CM | POA: Diagnosis present

## 2018-06-17 DIAGNOSIS — M545 Low back pain: Secondary | ICD-10-CM | POA: Diagnosis present

## 2018-06-17 DIAGNOSIS — I110 Hypertensive heart disease with heart failure: Secondary | ICD-10-CM | POA: Diagnosis present

## 2018-06-17 DIAGNOSIS — Z7901 Long term (current) use of anticoagulants: Secondary | ICD-10-CM | POA: Diagnosis not present

## 2018-06-17 DIAGNOSIS — Z932 Ileostomy status: Secondary | ICD-10-CM | POA: Diagnosis not present

## 2018-06-17 DIAGNOSIS — I639 Cerebral infarction, unspecified: Secondary | ICD-10-CM | POA: Diagnosis not present

## 2018-06-17 DIAGNOSIS — I129 Hypertensive chronic kidney disease with stage 1 through stage 4 chronic kidney disease, or unspecified chronic kidney disease: Secondary | ICD-10-CM | POA: Diagnosis not present

## 2018-06-17 DIAGNOSIS — I480 Paroxysmal atrial fibrillation: Secondary | ICD-10-CM | POA: Diagnosis not present

## 2018-06-17 DIAGNOSIS — M797 Fibromyalgia: Secondary | ICD-10-CM | POA: Diagnosis not present

## 2018-06-17 DIAGNOSIS — K5720 Diverticulitis of large intestine with perforation and abscess without bleeding: Secondary | ICD-10-CM | POA: Diagnosis not present

## 2018-06-17 DIAGNOSIS — J189 Pneumonia, unspecified organism: Secondary | ICD-10-CM | POA: Diagnosis not present

## 2018-06-17 DIAGNOSIS — G8929 Other chronic pain: Secondary | ICD-10-CM | POA: Diagnosis present

## 2018-06-17 DIAGNOSIS — R630 Anorexia: Secondary | ICD-10-CM | POA: Diagnosis present

## 2018-06-17 DIAGNOSIS — M069 Rheumatoid arthritis, unspecified: Secondary | ICD-10-CM | POA: Diagnosis present

## 2018-06-17 DIAGNOSIS — E860 Dehydration: Secondary | ICD-10-CM | POA: Diagnosis not present

## 2018-06-17 DIAGNOSIS — R531 Weakness: Secondary | ICD-10-CM | POA: Diagnosis not present

## 2018-06-17 DIAGNOSIS — E785 Hyperlipidemia, unspecified: Secondary | ICD-10-CM | POA: Diagnosis not present

## 2018-06-17 DIAGNOSIS — I5033 Acute on chronic diastolic (congestive) heart failure: Secondary | ICD-10-CM | POA: Diagnosis not present

## 2018-06-17 DIAGNOSIS — R41841 Cognitive communication deficit: Secondary | ICD-10-CM | POA: Diagnosis not present

## 2018-06-17 DIAGNOSIS — I82621 Acute embolism and thrombosis of deep veins of right upper extremity: Secondary | ICD-10-CM | POA: Diagnosis not present

## 2018-06-17 DIAGNOSIS — I4891 Unspecified atrial fibrillation: Secondary | ICD-10-CM | POA: Diagnosis not present

## 2018-06-17 DIAGNOSIS — E119 Type 2 diabetes mellitus without complications: Secondary | ICD-10-CM | POA: Diagnosis not present

## 2018-06-17 DIAGNOSIS — Z6833 Body mass index (BMI) 33.0-33.9, adult: Secondary | ICD-10-CM | POA: Diagnosis not present

## 2018-06-17 DIAGNOSIS — Z8673 Personal history of transient ischemic attack (TIA), and cerebral infarction without residual deficits: Secondary | ICD-10-CM | POA: Diagnosis not present

## 2018-06-17 DIAGNOSIS — E1165 Type 2 diabetes mellitus with hyperglycemia: Secondary | ICD-10-CM | POA: Diagnosis not present

## 2018-06-17 DIAGNOSIS — E876 Hypokalemia: Secondary | ICD-10-CM | POA: Diagnosis not present

## 2018-06-17 DIAGNOSIS — Z4801 Encounter for change or removal of surgical wound dressing: Secondary | ICD-10-CM | POA: Diagnosis not present

## 2018-06-17 DIAGNOSIS — I10 Essential (primary) hypertension: Secondary | ICD-10-CM | POA: Diagnosis not present

## 2018-06-17 DIAGNOSIS — R0902 Hypoxemia: Secondary | ICD-10-CM | POA: Diagnosis not present

## 2018-06-17 DIAGNOSIS — I872 Venous insufficiency (chronic) (peripheral): Secondary | ICD-10-CM | POA: Diagnosis present

## 2018-06-17 DIAGNOSIS — Z5181 Encounter for therapeutic drug level monitoring: Secondary | ICD-10-CM | POA: Diagnosis not present

## 2018-06-17 DIAGNOSIS — A419 Sepsis, unspecified organism: Secondary | ICD-10-CM | POA: Diagnosis not present

## 2018-06-17 DIAGNOSIS — R062 Wheezing: Secondary | ICD-10-CM | POA: Diagnosis not present

## 2018-06-17 DIAGNOSIS — I2510 Atherosclerotic heart disease of native coronary artery without angina pectoris: Secondary | ICD-10-CM | POA: Diagnosis present

## 2018-06-17 DIAGNOSIS — N390 Urinary tract infection, site not specified: Secondary | ICD-10-CM | POA: Diagnosis not present

## 2018-06-17 DIAGNOSIS — M6281 Muscle weakness (generalized): Secondary | ICD-10-CM | POA: Diagnosis not present

## 2018-06-17 DIAGNOSIS — Z4803 Encounter for change or removal of drains: Secondary | ICD-10-CM | POA: Diagnosis not present

## 2018-06-17 DIAGNOSIS — J156 Pneumonia due to other aerobic Gram-negative bacteria: Secondary | ICD-10-CM | POA: Diagnosis not present

## 2018-06-17 DIAGNOSIS — N179 Acute kidney failure, unspecified: Secondary | ICD-10-CM | POA: Diagnosis present

## 2018-06-17 DIAGNOSIS — R000 Tachycardia, unspecified: Secondary | ICD-10-CM | POA: Diagnosis not present

## 2018-06-17 DIAGNOSIS — E875 Hyperkalemia: Secondary | ICD-10-CM | POA: Diagnosis present

## 2018-06-17 DIAGNOSIS — Z7401 Bed confinement status: Secondary | ICD-10-CM | POA: Diagnosis not present

## 2018-06-17 DIAGNOSIS — L0291 Cutaneous abscess, unspecified: Secondary | ICD-10-CM | POA: Diagnosis not present

## 2018-06-17 DIAGNOSIS — Z48815 Encounter for surgical aftercare following surgery on the digestive system: Secondary | ICD-10-CM | POA: Diagnosis not present

## 2018-06-17 DIAGNOSIS — R2689 Other abnormalities of gait and mobility: Secondary | ICD-10-CM | POA: Diagnosis not present

## 2018-06-17 DIAGNOSIS — R0602 Shortness of breath: Secondary | ICD-10-CM | POA: Diagnosis not present

## 2018-06-17 DIAGNOSIS — J15212 Pneumonia due to Methicillin resistant Staphylococcus aureus: Secondary | ICD-10-CM | POA: Diagnosis not present

## 2018-06-17 LAB — BASIC METABOLIC PANEL
Anion gap: 8 (ref 5–15)
BUN: 25 mg/dL — ABNORMAL HIGH (ref 8–23)
CHLORIDE: 114 mmol/L — AB (ref 98–111)
CO2: 17 mmol/L — ABNORMAL LOW (ref 22–32)
CREATININE: 1.81 mg/dL — AB (ref 0.44–1.00)
Calcium: 8.3 mg/dL — ABNORMAL LOW (ref 8.9–10.3)
GFR calc Af Amer: 32 mL/min — ABNORMAL LOW (ref >60)
GFR calc non Af Amer: 27 mL/min — ABNORMAL LOW (ref >60)
Glucose, Bld: 120 mg/dL — ABNORMAL HIGH (ref 70–99)
Potassium: 4.4 mmol/L (ref 3.5–5.1)
Sodium: 139 mmol/L (ref 135–145)

## 2018-06-17 LAB — GLUCOSE, CAPILLARY
Glucose-Capillary: 117 mg/dL — ABNORMAL HIGH (ref 70–99)
Glucose-Capillary: 121 mg/dL — ABNORMAL HIGH (ref 70–99)

## 2018-06-17 LAB — PROTIME-INR
INR: 2.07
Prothrombin Time: 23 seconds — ABNORMAL HIGH (ref 11.4–15.2)

## 2018-06-17 LAB — MAGNESIUM: Magnesium: 2.1 mg/dL (ref 1.7–2.4)

## 2018-06-17 MED ORDER — WARFARIN SODIUM 2 MG PO TABS: 2.0000 mg | ORAL_TABLET | Freq: Once | ORAL | Status: DC

## 2018-06-17 MED ORDER — METOPROLOL SUCCINATE ER 50 MG PO TB24: 50.0000 mg | ORAL_TABLET | Freq: Every day | ORAL | 1 refills | Status: DC

## 2018-06-17 NOTE — Discharge Summary (Signed)
Physician Discharge Summary  [name] IOE:[id number] DOB: February 02, 1946 DOA: 06/13/2018  PCP: [name], MD  Admit date: 06/13/2018 Discharge date: 06/17/2018  Admitted From: SNF Disposition:  SNF  Recommendations for Outpatient Follow-up:  1. Follow up with PCP in 1-2 weeks 2. Please obtain BMP/CBC in one week 3. Please take patient to follow up with General Surgery [name])--Dr. Jaymes Graff on 06/20/2018 at 0930    Discharge Condition: Stable CODE STATUS: FULL Diet recommendation: Heart Healthy/carb modified  Brief History: 73 year old female with a history of coronary artery disease, hyperlipidemia, stroke, hypertension, diabetes mellitus, rheumatoid arthritis, fibromyalgia/chronic pain presenting withgeneralized weakness. The patient was recently admitted to the hospital from 04/05/2018 through 05/13/2018 due to perforated sigmoid diverticulitis with abscess. The patient hadan exploratory laparotomy with lysis of adhesions and small bowel resection with loop ileostomy on 04/30/2018 performed byDr. [name].A wound VAC was placed at that time. She was subsequently discharged to the [facility] for physical therapy rehab. She was discharged home from the [facility] on 06/13/2017. When she arrived home, the patient had a mechanical fall as she transfer from her wheelchair to a walker. She was too weak to get up. As result EMS was activated and the patient was brought to emergency department for further evaluation. Notably, the patient states that even in the past week prior to discharge home from the [facility], the patient complained of some generalized weakness. She was treated for hypomagnesemia. A CT of her abdomen and pelvis on 06/12/2018 was negative for any acute findings, abscess. It showed the percutaneous catheter in her abdomen extending into a completely drained abscess in the right upper pelvis. There was no residual abscess.  In the emergency  department on 06/13/2018, the patient was noted to have atrial fibrillation with RVR. She was given a dose of diltiazem IV with improvement of her RVR. Serum creatinine was noted to be 2.27 which is above her usual baseline. Magnesium was 1.3. As result, the patient was admitted for further evaluation.  Notably, the patient had an emergency department visit on 06/06/2017. She was noted to have atrial fibrillation with RVR at that time with heart rate in the 130s. The patient was given a dose of diltiazem IV with improvement of her heart rate. She was subsequent discharged back to the [facility].  Assessment/Plan: Generalized weakness -Multifactorial including AKI, hypomagnesemia, and new onset atrial fibrillation -Serum H82 -Folic XHBZ--1.6 -RCV--89 -Check UA--no pyuria -TSH--8.390; free T4 0.59 -Patient is still unable to walk. She is having difficulty with transfers. -PT eval>>>> skilled nursing facility  New onset atrial fibrillation with RVR -Place on telemetry -Rate is better controlled -Continue diltiazem and metoprolol succinate -TSH--8.390 -Free T4--0.59 -04/10/2017 echo EF 65-70%, no WMA, trivial TR -Patient already on warfarin from her right upper extremity DVT diagnosed 04/15/2018 -Appreciate cardiology consult--> increase metoprolol succinate to 50 mg daily  AKI -Baseline creatinine 0.7-1.0 -Serum creatinine peaked 2.27 -Continue IV fluids--increased to 125 cc/h>>>saline lock as pt appears clinically euvolemic -Renal ultrasound--negative for hydronephrosis -CPK--14 -serum creatinine stable last 5 days -likely has new renal baseline  1.7-2.0 -renal US--no hydronephrosis  Perforated sigmoid diverticulitis with abscess of the sigmoid colon -Status post expiratory laparotomy, LOA, small bowel resection, loop colostomy, and wound VAC placementby Dr Georgette Dover on 04/30/18 -She has a follow-up appointment with general surgery 06/20/2018@0930   AM  Hypomagnesemia: -Question whether this is related to ileostomy: Contents of the ileostomy bag did not appear like the patient was experiencing high-volume output from  the ileostomy. -Replace magnesium -start slow mag -given IV mag -Repeat magnesium in the morning--2.1  Essential hypertension -Continue diltiazem, Isordil, metoprolol succinate  Diabetes mellitus type 2 -Holding glipizide>>>restart after d/c -Hemoglobin A1c--5.9 -NovoLog sliding scale  Coronary artery disease -No chest pain presently -Continue Plavix -Continuemetoprolol succinate   Rheumatoid arthritis -Discontinue Arava--pt states she no longer takes it -pt follows Dr. Amil Amen in outpatient setting - cont tofacitinib 5mg  qd (on this x 3-4 yrs) -Continue home dose prednisone 5 mg daily  Hyperlipidemia -Continue pravastatin  Chronic pain syndrome/Fibromyalgia -continue home dose hydrocodone -Continue Lyrica  History of stroke and TIA -Continue plavix        Discharge Instructions   Allergies as of 06/17/2018      Reactions   Lactose Intolerance (gi) Other (See Comments)   G.IUnice Cobble [buprenorphine] Rash, Other (See Comments)   Infected skin underneath application   Simvastatin Rash      Medication List    STOP taking these medications   leflunomide 20 MG tablet Commonly known as:  ARAVA     TAKE these medications   clopidogrel 75 MG tablet Commonly known as:  PLAVIX Take 1 tablet (75 mg total) by mouth daily with breakfast.   Dexlansoprazole 30 MG capsule Take 30 mg by mouth daily.   diclofenac sodium 1 % Gel Commonly known as:  VOLTAREN Apply 2 g topically 4 (four) times daily as needed (Pain).   diltiazem 120 MG 24 hr capsule Commonly known as:  CARDIZEM CD Take 1 capsule (120 mg total) by mouth daily.   ferrous sulfate 325 (65 FE) MG tablet Take 1 tablet (325 mg total) by mouth 2 (two) times daily with a meal.   HYDROcodone-acetaminophen  10-325 MG tablet Commonly known as:  NORCO Take 1 tablet by mouth 4 times a day as needed   isosorbide dinitrate 5 MG tablet Commonly known as:  ISORDIL Take 1 tablet (5 mg total) by mouth 3 (three) times daily before meals.   Magnesium 200 MG Tabs Take 1 tablet by mouth daily.   metoprolol succinate 50 MG 24 hr tablet Commonly known as:  TOPROL-XL Take 1 tablet (50 mg total) by mouth daily. Take with or immediately following a meal. Start taking on:  June 18, 2018 What changed:    medication strength  how much to take  additional instructions   NOVOLOG FLEXPEN 100 UNIT/ML FlexPen Generic drug:  insulin aspart Give per sliding scale 2 times a day, 140-199 - 2 units, 200-250 - 4 units, 251-299 - 6 units,  300-349 - 8 units,  350 or above 10 units. Dispense syringes and needles as needed, Ok to switch to PEN if approved. Substitute to any brand approved. DX DM2, Code E11.65   PARoxetine 20 MG tablet Commonly known as:  PAXIL Take 20 mg by mouth daily.   potassium chloride SA 20 MEQ tablet Commonly known as:  K-DUR,KLOR-CON Take 20 mEq by mouth daily.   pravastatin 40 MG tablet Commonly known as:  PRAVACHOL Take 40 mg by mouth at bedtime.   predniSONE 5 MG tablet Commonly known as:  DELTASONE Take 5 mg by mouth daily with breakfast.   pregabalin 50 MG capsule Commonly known as:  LYRICA Take 1 capsule (50 mg total) by mouth 3 (three) times daily.   SLOW-MAG 64 MG Tbec SR tablet Generic drug:  magnesium chloride Give 143 mg by mouth three times a day   temazepam 30 MG capsule Commonly known as:  RESTORIL Take 1 capsule (30 mg total) by mouth at bedtime.   timolol 0.5 % ophthalmic solution Commonly known as:  BETIMOL Place 1 drop into both eyes 2 (two) times daily.   Vitamin D (Ergocalciferol) 1.25 MG (50000 UT) Caps capsule Commonly known as:  DRISDOL Take 50,000 Units by mouth every Thursday.   warfarin 1 MG tablet Commonly known as:  COUMADIN Take 1.5  mg by mouth every evening.   XELJANZ 5 MG Tabs Generic drug:  Tofacitinib Citrate Take 1 tablet by mouth daily.   ZIOPTAN 0.0015 % Soln Generic drug:  Tafluprost Place 1 drop into both eyes at bedtime.       Contact information for follow-up providers    Imogene Burn, PA-C Follow up on 07/21/2018.   Specialty:  Cardiology Why:  Cardiology Follow-Up on 07/21/2018 at 11:30 with Ermalinda Barrios, PA-C (works with Dr. Bronson Ing).  Contact information: [place] [address] [phone]            Contact information for after-discharge care    Destination    [facility] .   Service:  Skilled Nursing Contact information: [address] [place] [phone]                 Allergies  Allergen Reactions  . Lactose Intolerance (Gi) Other (See Comments)    G.I. Upset  . Butrans [Buprenorphine] Rash and Other (See Comments)    Infected skin underneath application  . Simvastatin Rash    Consultations:  cardiology   Procedures/Studies: Ct Abdomen Pelvis Wo Contrast  Result Date: 06/12/2018 CLINICAL DATA:  73 year old who underwent laparotomy 04/30/2018 for perforated sigmoid diverticulitis and multiple intra-abdominal abscesses for which she underwent lysis of adhesions, short segment small-bowel resection and diverting loop ileostomy. She presents now with abdominal pain and fever. EXAM: CT ABDOMEN AND PELVIS WITHOUT CONTRAST TECHNIQUE: Multidetector CT imaging of the abdomen and pelvis was performed following the standard protocol without IV contrast. Oral contrast was administered. COMPARISON:  05/07/2018, 04/27/2018 and earlier. FINDINGS: Lower chest: Mild scarring and bronchiectasis involving the lower lobes. Scarring involving the RIGHT MIDDLE LOBE. No new abnormalities involving the lung bases. Heart mildly enlarged but stable. Mitral annular calcification again noted.  Hepatobiliary: Normal unenhanced appearance of the liver. Surgically absent gallbladder. No biliary ductal dilation. Pancreas: Mild diffuse atrophy. Otherwise normal unenhanced appearance. Spleen: Normal unenhanced appearance. Adrenals/Urinary Tract: Normal appearing adrenal glands. Non-obstructing approximate 4 mm calculus in a LOWER pole calyx of the RIGHT kidney. No urinary tract calculi elsewhere. No hydronephrosis. Allowing for the unenhanced technique, no focal parenchymal abnormality involving either kidney. Small diverticulum arising from the RIGHT posterolateral wall of the urinary bladder. Opaque material within the dependent portion of the urinary bladder. Stomach/Bowel: Normal-appearing stomach containing opaque ingested tablets. Normal-appearing small bowel. Small bowel surgical anastomosis in the LEFT mid abdomen at the level of the umbilicus. Normal-appearing ileostomy in the RIGHT mid abdomen. Contrast material passes through the small bowel into the ostomy bag without obstruction. Decompressed colon with diffuse diverticulosis, including extensive descending and sigmoid colon diverticulosis. No evidence of diverticulitis. Vascular/Lymphatic: Severe aortoiliofemoral atherosclerosis without evidence of aneurysm. No pathologic lymphadenopathy. Reproductive: Normal-appearing uterus and ovaries without evidence of adnexal mass. Other: Percutaneous catheter within a completely drained abscess in the RIGHT UPPER pelvis. Surgical drain in the pelvis. No evidence of intra-abdominal or pelvic abscess currently. No ascites. Midline surgical incision without evidence of incisional hernia or  abscess. Musculoskeletal: Multilevel degenerative disc disease, spondylosis and facet degenerative changes throughout the lumbar spine. Degenerative changes involving both hips. No acute findings. IMPRESSION: 1. No acute abnormalities involving the abdomen or pelvis. 2. No evidence of intra-abdominal or pelvic abscess  currently. Percutaneous catheter within a completely drained abscess in the RIGHT UPPER pelvis. 3. Non-obstructing approximate 4 mm calculus in a LOWER pole calyx of the RIGHT kidney. No urinary tract calculi elsewhere. 4. Diffuse colonic diverticulosis, including extensive descending and sigmoid colon diverticulosis. No evidence of diverticulitis. Aortic Atherosclerosis (ICD10-170.0) Electronically Signed   By: Evangeline Dakin M.D.   On: 06/12/2018 11:21   Ct Head Wo Contrast  Result Date: 06/08/2018 CLINICAL DATA:  Patient status post fall. No reported loss of consciousness. EXAM: CT HEAD WITHOUT CONTRAST CT CERVICAL SPINE WITHOUT CONTRAST TECHNIQUE: Multidetector CT imaging of the head and cervical spine was performed following the standard protocol without intravenous contrast. Multiplanar CT image reconstructions of the cervical spine were also generated. COMPARISON:  Cervical spine CT 06/27/2016 FINDINGS: CT HEAD FINDINGS Brain: Ventricles and sulci are prominent compatible with atrophy. Periventricular and subcortical white matter hypodensity compatible with chronic microvascular ischemic changes. No evidence for acute cortically based infarct, intracranial hemorrhage, mass lesion or mass-effect. Basal ganglia calcifications. Vascular: Unremarkable Skull: Intact. Sinuses/Orbits: Paranasal sinuses are well aerated. Mastoid air cells are unremarkable. Orbits are unremarkable. Other: None. CT CERVICAL SPINE FINDINGS Alignment: Normal anatomic alignment. Skull base and vertebrae: No acute fracture. No primary bone lesion or focal pathologic process. Soft tissues and spinal canal: No prevertebral fluid or swelling. No visible canal hematoma. Disc levels: Multilevel degenerative disc disease most pronounced C5-6 and C6-7. no acute fracture. Upper chest: Unremarkable. Other: Bilateral carotid arterial vascular calcifications. IMPRESSION: No acute intracranial process. No acute cervical spine fracture.  Electronically Signed   By: Lovey Newcomer M.D.   On: 06/08/2018 13:41   Ct Cervical Spine Wo Contrast  Result Date: 06/08/2018 CLINICAL DATA:  Patient status post fall. No reported loss of consciousness. EXAM: CT HEAD WITHOUT CONTRAST CT CERVICAL SPINE WITHOUT CONTRAST TECHNIQUE: Multidetector CT imaging of the head and cervical spine was performed following the standard protocol without intravenous contrast. Multiplanar CT image reconstructions of the cervical spine were also generated. COMPARISON:  Cervical spine CT 06/27/2016 FINDINGS: CT HEAD FINDINGS Brain: Ventricles and sulci are prominent compatible with atrophy. Periventricular and subcortical white matter hypodensity compatible with chronic microvascular ischemic changes. No evidence for acute cortically based infarct, intracranial hemorrhage, mass lesion or mass-effect. Basal ganglia calcifications. Vascular: Unremarkable Skull: Intact. Sinuses/Orbits: Paranasal sinuses are well aerated. Mastoid air cells are unremarkable. Orbits are unremarkable. Other: None. CT CERVICAL SPINE FINDINGS Alignment: Normal anatomic alignment. Skull base and vertebrae: No acute fracture. No primary bone lesion or focal pathologic process. Soft tissues and spinal canal: No prevertebral fluid or swelling. No visible canal hematoma. Disc levels: Multilevel degenerative disc disease most pronounced C5-6 and C6-7. no acute fracture. Upper chest: Unremarkable. Other: Bilateral carotid arterial vascular calcifications. IMPRESSION: No acute intracranial process. No acute cervical spine fracture. Electronically Signed   By: Lovey Newcomer M.D.   On: 06/08/2018 13:41   US Renal  Result Date: 06/15/2018 CLINICAL DATA:  Acute kidney injury. EXAM: RENAL / URINARY TRACT ULTRASOUND COMPLETE COMPARISON:  CT of the abdomen on 06/12/2000 FINDINGS: Right Kidney: Renal measurements: 11.1 x 5.4 x 5.2 cm = volume: 164 mL . Echogenicity within normal limits. No mass or hydronephrosis  visualized. Left Kidney: Renal measurements: 11.7 x 6.2 x 5.8 cm =  volume: 219 mL. Echogenicity within normal limits. No mass or hydronephrosis visualized. Bladder: The bladder is decompressed by a Foley catheter. IMPRESSION: Unremarkable renal ultrasound demonstrating no significant renal atrophy or evidence of renal obstruction. Electronically Signed   By: Aletta Edouard M.D.   On: 06/15/2018 10:47   Dg Chest Port 1 View  Result Date: 06/10/2018 CLINICAL DATA:  PICC placement. EXAM: PORTABLE CHEST 1 VIEW COMPARISON:  04/11/2018 FINDINGS: PICC tip is in the superior vena cava at the level of the azygos vein. Heart size and vascularity are normal and the lungs are clear. CABG. No acute bone abnormality. IMPRESSION: PICC in good position. No acute cardiopulmonary findings. Electronically Signed   By: Lorriane Shire M.D.   On: 06/10/2018 17:51   Dg Chest Port 1v Same Day  Result Date: 06/10/2018 CLINICAL DATA:  PICC placement. EXAM: PORTABLE CHEST 1 VIEW 6:15 p.m. COMPARISON:  06/10/2018 at 5:24 p.m. FINDINGS: PICC tip remains unchanged in position in the superior vena cava at the level of the azygos vein. Heart size and vascularity are normal. Lungs are clear. CABG. IMPRESSION: No change in the PICC tip in the superior vena cava. No acute cardiopulmonary findings. Electronically Signed   By: Lorriane Shire M.D.   On: 06/10/2018 18:37   Dg Humerus Left  Result Date: 06/10/2018 CLINICAL DATA:  PICC line placement EXAM: LEFT HUMERUS - 2+ VIEW COMPARISON:  None. FINDINGS: There is a PICC line identified in the upper left arm in the expected course. The distal portion of the PICC line is not included on film. IMPRESSION: There is a PICC line identified in the upper left arm in the expected course. The distal portion of the PICC line is not included on film. Electronically Signed   By: Abelardo Diesel M.D.   On: 06/10/2018 18:02   Ir Picc Replacement Leftinc Imgguide  Result Date: 05/19/2018 INDICATION:  Malfunctioning PICC line. Diverticular abscess. Need to continue IV antibiotics. EXAM: FLUOROSCOPIC GUIDED PICC LINE EXCHANGE TECHNIQUE: The procedure, risks, benefits, and alternatives were explained to the patient and informed written consent was obtained. The left upper extremity and external portion of the existing PICC line was prepped with chlorhexidine in a sterile fashion, and a sterile drape was applied covering the operative field. Maximum barrier sterile technique with sterile gowns and gloves were used for the procedure. A timeout was performed prior to the initiation of the procedure. Local anesthesia was provided with 1% lidocaine. The existing PICC line was cannulated with an 0.0018 wire which was advanced through the catheter. The catheter was exchanged for a peel-away sheath, ultimately allowing advancement of a 46-cm, 5- [place], double lumen PICC line to the level of the superior caval atrial junction. A post procedure spot fluoroscopic image was obtained. The catheter easily aspirated and flushed and was secured in place. A dressing was placed. The patient tolerated the procedure well without immediate post procedural complication. FINDINGS: After catheter exchange, the tip lies within the superior cavoatrial junction. The catheter aspirates and flushes normally, it was capped, and is ready for immediate use. IMPRESSION: Successful fluoroscopic guided exchange of left upper extremity approach 46 cm, 5 - [place], double lumen PICC with tip overlying the superior caval atrial junction. The PICC line is ready for immediate use. Read by: Gareth Eagle, PA-C Electronically Signed   By: Markus Daft M.D.   On: 05/19/2018 13:50        Discharge Exam: Vitals:   06/17/18 0623 06/17/18 0825  BP: 124/68  Pulse: 79   Resp: 18   Temp: 97.6 F (36.4 C)   SpO2: 98% 96%   Vitals:   06/16/18 1943 06/16/18 2158 06/17/18 0623 06/17/18 0825  BP:  127/78 124/68   Pulse:  71 79   Resp:  20 18   Temp:   98.6 F (37 C) 97.6 F (36.4 C)   TempSrc:  Oral Oral   SpO2: 90% 97% 98% 96%  Weight:      Height:        General: Pt is alert, awake, not in acute distress Cardiovascular: RRR, S1/S2 +, no rubs, no gallops Respiratory: fine bibasilar crackles, no wheeze Abdominal: Soft, NT, ND, bowel sounds + Extremities: no edema, no cyanosis   The results of significant diagnostics from this hospitalization (including imaging, microbiology, ancillary and laboratory) are listed below for reference.    Significant Diagnostic Studies: Ct Abdomen Pelvis Wo Contrast  Result Date: 06/12/2018 CLINICAL DATA:  73 year old who underwent laparotomy 04/30/2018 for perforated sigmoid diverticulitis and multiple intra-abdominal abscesses for which she underwent lysis of adhesions, short segment small-bowel resection and diverting loop ileostomy. She presents now with abdominal pain and fever. EXAM: CT ABDOMEN AND PELVIS WITHOUT CONTRAST TECHNIQUE: Multidetector CT imaging of the abdomen and pelvis was performed following the standard protocol without IV contrast. Oral contrast was administered. COMPARISON:  05/07/2018, 04/27/2018 and earlier. FINDINGS: Lower chest: Mild scarring and bronchiectasis involving the lower lobes. Scarring involving the RIGHT MIDDLE LOBE. No new abnormalities involving the lung bases. Heart mildly enlarged but stable. Mitral annular calcification again noted. Hepatobiliary: Normal unenhanced appearance of the liver. Surgically absent gallbladder. No biliary ductal dilation. Pancreas: Mild diffuse atrophy. Otherwise normal unenhanced appearance. Spleen: Normal unenhanced appearance. Adrenals/Urinary Tract: Normal appearing adrenal glands. Non-obstructing approximate 4 mm calculus in a LOWER pole calyx of the RIGHT kidney. No urinary tract calculi elsewhere. No hydronephrosis. Allowing for the unenhanced technique, no focal parenchymal abnormality involving either kidney. Small diverticulum arising  from the RIGHT posterolateral wall of the urinary bladder. Opaque material within the dependent portion of the urinary bladder. Stomach/Bowel: Normal-appearing stomach containing opaque ingested tablets. Normal-appearing small bowel. Small bowel surgical anastomosis in the LEFT mid abdomen at the level of the umbilicus. Normal-appearing ileostomy in the RIGHT mid abdomen. Contrast material passes through the small bowel into the ostomy bag without obstruction. Decompressed colon with diffuse diverticulosis, including extensive descending and sigmoid colon diverticulosis. No evidence of diverticulitis. Vascular/Lymphatic: Severe aortoiliofemoral atherosclerosis without evidence of aneurysm. No pathologic lymphadenopathy. Reproductive: Normal-appearing uterus and ovaries without evidence of adnexal mass. Other: Percutaneous catheter within a completely drained abscess in the RIGHT UPPER pelvis. Surgical drain in the pelvis. No evidence of intra-abdominal or pelvic abscess currently. No ascites. Midline surgical incision without evidence of incisional hernia or abscess. Musculoskeletal: Multilevel degenerative disc disease, spondylosis and facet degenerative changes throughout the lumbar spine. Degenerative changes involving both hips. No acute findings. IMPRESSION: 1. No acute abnormalities involving the abdomen or pelvis. 2. No evidence of intra-abdominal or pelvic abscess currently. Percutaneous catheter within a completely drained abscess in the RIGHT UPPER pelvis. 3. Non-obstructing approximate 4 mm calculus in a LOWER pole calyx of the RIGHT kidney. No urinary tract calculi elsewhere. 4. Diffuse colonic diverticulosis, including extensive descending and sigmoid colon diverticulosis. No evidence of diverticulitis. Aortic Atherosclerosis (ICD10-170.0) Electronically Signed   By: Evangeline Dakin M.D.   On: 06/12/2018 11:21   Ct Head Wo Contrast  Result Date: 06/08/2018 CLINICAL DATA:  Patient status post fall.  No  reported loss of consciousness. EXAM: CT HEAD WITHOUT CONTRAST CT CERVICAL SPINE WITHOUT CONTRAST TECHNIQUE: Multidetector CT imaging of the head and cervical spine was performed following the standard protocol without intravenous contrast. Multiplanar CT image reconstructions of the cervical spine were also generated. COMPARISON:  Cervical spine CT 06/27/2016 FINDINGS: CT HEAD FINDINGS Brain: Ventricles and sulci are prominent compatible with atrophy. Periventricular and subcortical white matter hypodensity compatible with chronic microvascular ischemic changes. No evidence for acute cortically based infarct, intracranial hemorrhage, mass lesion or mass-effect. Basal ganglia calcifications. Vascular: Unremarkable Skull: Intact. Sinuses/Orbits: Paranasal sinuses are well aerated. Mastoid air cells are unremarkable. Orbits are unremarkable. Other: None. CT CERVICAL SPINE FINDINGS Alignment: Normal anatomic alignment. Skull base and vertebrae: No acute fracture. No primary bone lesion or focal pathologic process. Soft tissues and spinal canal: No prevertebral fluid or swelling. No visible canal hematoma. Disc levels: Multilevel degenerative disc disease most pronounced C5-6 and C6-7. no acute fracture. Upper chest: Unremarkable. Other: Bilateral carotid arterial vascular calcifications. IMPRESSION: No acute intracranial process. No acute cervical spine fracture. Electronically Signed   By: Lovey Newcomer M.D.   On: 06/08/2018 13:41   Ct Cervical Spine Wo Contrast  Result Date: 06/08/2018 CLINICAL DATA:  Patient status post fall. No reported loss of consciousness. EXAM: CT HEAD WITHOUT CONTRAST CT CERVICAL SPINE WITHOUT CONTRAST TECHNIQUE: Multidetector CT imaging of the head and cervical spine was performed following the standard protocol without intravenous contrast. Multiplanar CT image reconstructions of the cervical spine were also generated. COMPARISON:  Cervical spine CT 06/27/2016 FINDINGS: CT HEAD  FINDINGS Brain: Ventricles and sulci are prominent compatible with atrophy. Periventricular and subcortical white matter hypodensity compatible with chronic microvascular ischemic changes. No evidence for acute cortically based infarct, intracranial hemorrhage, mass lesion or mass-effect. Basal ganglia calcifications. Vascular: Unremarkable Skull: Intact. Sinuses/Orbits: Paranasal sinuses are well aerated. Mastoid air cells are unremarkable. Orbits are unremarkable. Other: None. CT CERVICAL SPINE FINDINGS Alignment: Normal anatomic alignment. Skull base and vertebrae: No acute fracture. No primary bone lesion or focal pathologic process. Soft tissues and spinal canal: No prevertebral fluid or swelling. No visible canal hematoma. Disc levels: Multilevel degenerative disc disease most pronounced C5-6 and C6-7. no acute fracture. Upper chest: Unremarkable. Other: Bilateral carotid arterial vascular calcifications. IMPRESSION: No acute intracranial process. No acute cervical spine fracture. Electronically Signed   By: Lovey Newcomer M.D.   On: 06/08/2018 13:41   US Renal  Result Date: 06/15/2018 CLINICAL DATA:  Acute kidney injury. EXAM: RENAL / URINARY TRACT ULTRASOUND COMPLETE COMPARISON:  CT of the abdomen on 06/12/2000 FINDINGS: Right Kidney: Renal measurements: 11.1 x 5.4 x 5.2 cm = volume: 164 mL . Echogenicity within normal limits. No mass or hydronephrosis visualized. Left Kidney: Renal measurements: 11.7 x 6.2 x 5.8 cm = volume: 219 mL. Echogenicity within normal limits. No mass or hydronephrosis visualized. Bladder: The bladder is decompressed by a Foley catheter. IMPRESSION: Unremarkable renal ultrasound demonstrating no significant renal atrophy or evidence of renal obstruction. Electronically Signed   By: Aletta Edouard M.D.   On: 06/15/2018 10:47   Dg Chest Port 1 View  Result Date: 06/10/2018 CLINICAL DATA:  PICC placement. EXAM: PORTABLE CHEST 1 VIEW COMPARISON:  04/11/2018 FINDINGS: PICC tip is in  the superior vena cava at the level of the azygos vein. Heart size and vascularity are normal and the lungs are clear. CABG. No acute bone abnormality. IMPRESSION: PICC in good position. No acute cardiopulmonary findings. Electronically Signed   By: Lorriane Shire M.D.  On: 06/10/2018 17:51   Dg Chest Port 1v Same Day  Result Date: 06/10/2018 CLINICAL DATA:  PICC placement. EXAM: PORTABLE CHEST 1 VIEW 6:15 p.m. COMPARISON:  06/10/2018 at 5:24 p.m. FINDINGS: PICC tip remains unchanged in position in the superior vena cava at the level of the azygos vein. Heart size and vascularity are normal. Lungs are clear. CABG. IMPRESSION: No change in the PICC tip in the superior vena cava. No acute cardiopulmonary findings. Electronically Signed   By: Lorriane Shire M.D.   On: 06/10/2018 18:37   Dg Humerus Left  Result Date: 06/10/2018 CLINICAL DATA:  PICC line placement EXAM: LEFT HUMERUS - 2+ VIEW COMPARISON:  None. FINDINGS: There is a PICC line identified in the upper left arm in the expected course. The distal portion of the PICC line is not included on film. IMPRESSION: There is a PICC line identified in the upper left arm in the expected course. The distal portion of the PICC line is not included on film. Electronically Signed   By: Abelardo Diesel M.D.   On: 06/10/2018 18:02   Ir Picc Replacement Leftinc Imgguide  Result Date: 05/19/2018 INDICATION: Malfunctioning PICC line. Diverticular abscess. Need to continue IV antibiotics. EXAM: FLUOROSCOPIC GUIDED PICC LINE EXCHANGE TECHNIQUE: The procedure, risks, benefits, and alternatives were explained to the patient and informed written consent was obtained. The left upper extremity and external portion of the existing PICC line was prepped with chlorhexidine in a sterile fashion, and a sterile drape was applied covering the operative field. Maximum barrier sterile technique with sterile gowns and gloves were used for the procedure. A timeout was performed  prior to the initiation of the procedure. Local anesthesia was provided with 1% lidocaine. The existing PICC line was cannulated with an 0.0018 wire which was advanced through the catheter. The catheter was exchanged for a peel-away sheath, ultimately allowing advancement of a 46-cm, 5- [place], double lumen PICC line to the level of the superior caval atrial junction. A post procedure spot fluoroscopic image was obtained. The catheter easily aspirated and flushed and was secured in place. A dressing was placed. The patient tolerated the procedure well without immediate post procedural complication. FINDINGS: After catheter exchange, the tip lies within the superior cavoatrial junction. The catheter aspirates and flushes normally, it was capped, and is ready for immediate use. IMPRESSION: Successful fluoroscopic guided exchange of left upper extremity approach 46 cm, 5 - [place], double lumen PICC with tip overlying the superior caval atrial junction. The PICC line is ready for immediate use. Read by: Gareth Eagle, PA-C Electronically Signed   By: Markus Daft M.D.   On: 05/19/2018 13:50     Microbiology: Recent Results (from the past 240 hour(s))  Urine culture     Status: None   Collection Time: 06/13/18  6:30 PM  Result Value Ref Range Status   Specimen Description   Final    URINE, CATHETERIZED Performed at [facility], [address] [place], [place] [address]    Special Requests   Final    NONE Performed at [facility], [address] [place], [place] [address]    Culture   Final    NO GROWTH Performed at [facility], [place] [address] [place], [place] [address]    Report Status 06/15/2018 FINAL  Final     Labs: Basic Metabolic Panel: Recent Labs  Lab 06/13/18 1719 06/14/18 0802 06/15/18 0609 06/15/18 1016 06/16/18 0555 06/17/18 0439  NA 137 140 138  --  139 139  K 5.5* 5.1 4.7  --  4.6 4.4  CL 109 116* 112*  --  117* 114*  CO2 18* 17* 18*  --  17* 17*  GLUCOSE 145* 107*  100*  --  104* 120*  BUN 34* 31* 31*  --  29* 25*  CREATININE 2.27* 1.93* 1.90*  --  1.75* 1.81*  CALCIUM 9.0 9.0 8.8*  --  8.5* 8.3*  MG 1.3* 1.8  --  1.3* 1.2* 2.1   Liver Function Tests: Recent Labs  Lab 06/13/18 0756 06/13/18 1719  AST 43* 43*  ALT 26 29  ALKPHOS 89 97  BILITOT 0.6 0.5  PROT 5.7* 7.0  ALBUMIN 2.9* 3.3*   No results for input(s): LIPASE, AMYLASE in the last 168 hours. No results for input(s): AMMONIA in the last 168 hours. CBC: Recent Labs  Lab 06/10/18 1314 06/11/18 0650 06/13/18 0756 06/13/18 1719 06/14/18 0508  WBC 7.6 6.9 7.1 7.8 7.5  NEUTROABS  --  4.6 4.9 6.2  --   HGB 10.3* 10.3* 10.7* 11.2* 11.6*  HCT 35.7* 35.1* 36.3 38.2 40.1  MCV 105.9* 105.7* 106.8* 104.1* 108.4*  PLT 379 368 339 338 330   Cardiac Enzymes: Recent Labs  Lab 06/15/18 1016  CKTOTAL 14*   BNP: Invalid input(s): POCBNP CBG: Recent Labs  Lab 06/16/18 1105 06/16/18 1653 06/16/18 2155 06/17/18 0739 06/17/18 1112  GLUCAP 137* 134* 103* 117* 121*    Time coordinating discharge:  36 minutes  Signed:  Orson Eva, DO [facility] Pager: [phone] 06/17/2018, 1:11 PM

## 2018-06-17 NOTE — Clinical Social Work Placement (Signed)
   CLINICAL SOCIAL WORK PLACEMENT  NOTE  Date:  06/17/2018  Patient Details  Name: [name] MRN: [id number] Date of Birth: 1945-10-03  Clinical Social Work is seeking post-discharge placement for this patient at the [place] level of care (*CSW will initial, date and re-position this form in  chart as items are completed):  Yes   Patient/family provided with [facility] Work Department's list of facilities offering this level of care within the geographic area requested by the patient (or if unable, by the patient's family).  Yes   Patient/family informed of their freedom to choose among providers that offer the needed level of care, that participate in Medicare, Medicaid or managed care program needed by the patient, have an available bed and are willing to accept the patient.  Yes   Patient/family informed of [facility]'s ownership interest in [facility] and [facility], as well as of the fact that they are under no obligation to receive care at these facilities.  PASRR submitted to EDS on       PASRR number received on       Existing PASRR number confirmed on 06/16/18     FL2 transmitted to all facilities in geographic area requested by pt/family on 06/16/18     FL2 transmitted to all facilities within larger geographic area on       Patient informed that his/her managed care company has contracts with or will negotiate with certain facilities, including the following:        Yes   Patient/family informed of bed offers received.  Patient chooses bed at [facility]     Physician recommends and patient chooses bed at      Patient to be transferred to [facility] on 06/17/18.  Patient to be transferred to facility by [facility]     Patient family notified on 06/17/18 of transfer.  Name of family member notified:  spouse is in room and aware.      PHYSICIAN       Additional Comment:  Discharge clinicals  sent to facility. Facility aware of discharge. LCSW signing off.   _______________________________________________ Ihor Gully, LCSW 06/17/2018, 2:05 PM

## 2018-06-17 NOTE — Progress Notes (Signed)
Progress Note  Patient Name: [name] Date of Encounter: 06/17/2018  Primary Cardiologist: Adrian Prows, MD   Subjective   Feels weak and tired.  Denies chest pain, palpitations, and shortness of breath. I spoke to the patient and her husband about their motorhome travels across the [place].  Inpatient Medications    Scheduled Meds: . clopidogrel  75 mg Oral Q breakfast  . diltiazem  120 mg Oral Daily  . insulin aspart  0-15 Units Subcutaneous TID WC  . insulin aspart  0-5 Units Subcutaneous QHS  . isosorbide dinitrate  5 mg Oral TID AC  . latanoprost  1 drop Both Eyes QHS  . magnesium chloride  2 tablet Oral TID  . metoprolol succinate  50 mg Oral Daily  . pantoprazole  40 mg Oral Daily  . PARoxetine  20 mg Oral Daily  . pravastatin  40 mg Oral QHS  . predniSONE  5 mg Oral Q breakfast  . pregabalin  50 mg Oral TID  . temazepam  30 mg Oral QHS  . timolol  1 drop Both Eyes BID  . Tofacitinib Citrate  1 tablet Oral Daily  . Warfarin - Pharmacist Dosing Inpatient   Does not apply Q24H   Continuous Infusions: . sodium chloride 125 mL/hr at 06/17/18 0500   PRN Meds: acetaminophen **OR** acetaminophen, HYDROcodone-acetaminophen, ondansetron **OR** ondansetron (ZOFRAN) IV   Vital Signs    Vitals:   06/16/18 1943 06/16/18 2158 06/17/18 0623 06/17/18 0825  BP:  127/78 124/68   Pulse:  71 79   Resp:  20 18   Temp:  98.6 F (37 C) 97.6 F (36.4 C)   TempSrc:  Oral Oral   SpO2: 90% 97% 98% 96%  Weight:      Height:        Intake/Output Summary (Last 24 hours) at 06/17/2018 0945 Last data filed at 06/17/2018 0615 Gross per 24 hour  Intake 3911.03 ml  Output 1510 ml  Net 2401.03 ml   Filed Weights   06/13/18 1650  Weight: 101.6 kg    Telemetry    Rate controlled atrial fibrillation, rare PVCs- Personally Reviewed  ECG    No new tracings- Personally Reviewed  Physical Exam   GEN: No acute distress.   Neck: No JVD Cardiac:  Regular rate and  irregular rhythm, no murmurs, rubs, or gallops.  Respiratory: Clear to auscultation bilaterally. GI: Soft, nontender, non-distended  MS: No edema; No deformity. Neuro:  Nonfocal  Psych: Normal affect   Labs    Chemistry Recent Labs  Lab 06/13/18 0756 06/13/18 1719  06/15/18 0609 06/16/18 0555 06/17/18 0439  NA 138 137   < > 138 139 139  K 5.0 5.5*   < > 4.7 4.6 4.4  CL 111 109   < > 112* 117* 114*  CO2 18* 18*   < > 18* 17* 17*  GLUCOSE 85 145*   < > 100* 104* 120*  BUN 32* 34*   < > 31* 29* 25*  CREATININE 2.00* 2.27*   < > 1.90* 1.75* 1.81*  CALCIUM 9.3 9.0   < > 8.8* 8.5* 8.3*  PROT 5.7* 7.0  --   --   --   --   ALBUMIN 2.9* 3.3*  --   --   --   --   AST 43* 43*  --   --   --   --   ALT 26 29  --   --   --   --  ALKPHOS 89 97  --   --   --   --   BILITOT 0.6 0.5  --   --   --   --   GFRNONAA 24* 21*   < > 26* 28* 27*  GFRAA 28* 24*   < > 30* 33* 32*  ANIONGAP 9 10   < > 8 5 8    < > = values in this interval not displayed.     Hematology Recent Labs  Lab 06/13/18 0756 06/13/18 1719 06/14/18 0508  WBC 7.1 7.8 7.5  RBC 3.40* 3.67* 3.70*  HGB 10.7* 11.2* 11.6*  HCT 36.3 38.2 40.1  MCV 106.8* 104.1* 108.4*  MCH 31.5 30.5 31.4  MCHC 29.5* 29.3* 28.9*  RDW 19.7* 19.4* 19.2*  PLT 339 338 330    Cardiac EnzymesNo results for input(s): TROPONINI in the last 168 hours. No results for input(s): TROPIPOC in the last 168 hours.   BNPNo results for input(s): BNP, PROBNP in the last 168 hours.   DDimer No results for input(s): DDIMER in the last 168 hours.   Radiology    US Renal  Result Date: 06/15/2018 CLINICAL DATA:  Acute kidney injury. EXAM: RENAL / URINARY TRACT ULTRASOUND COMPLETE COMPARISON:  CT of the abdomen on 06/12/2000 FINDINGS: Right Kidney: Renal measurements: 11.1 x 5.4 x 5.2 cm = volume: 164 mL . Echogenicity within normal limits. No mass or hydronephrosis visualized. Left Kidney: Renal measurements: 11.7 x 6.2 x 5.8 cm = volume: 219 mL.  Echogenicity within normal limits. No mass or hydronephrosis visualized. Bladder: The bladder is decompressed by a Foley catheter. IMPRESSION: Unremarkable renal ultrasound demonstrating no significant renal atrophy or evidence of renal obstruction. Electronically Signed   By: Aletta Edouard M.D.   On: 06/15/2018 10:47    Cardiac Studies   None this admission  Patient Profile     73 y.o. female with a hx of CAD who is being seen today for the evaluation of Afib at the request of Dr. Carles Collet.  Assessment & Plan    1.  Rapid atrial fibrillation: Heart rate controlled with increase of Toprol-XL to 50 mg daily on 06/16/2018.  She is also on diltiazem 120 mg daily and is anticoagulated with warfarin.  No changes to therapy.  2.  Coronary artery disease: History of CABG in 1986.  Low risk nuclear stress test in 2012.  Symptomatically stable.  Continue medical therapy with Toprol-XL, pravastatin, and isosorbide dinitrate.  She is on Plavix for a history of CVA.  3.  History of CVA: On Plavix and pravastatin.  4.  Hypertension: Controlled on present therapy.  No changes.  5.  Generalized weakness: Multifactorial in etiology.  She needs physical therapy.  She was hospitalized for a prolonged period which has contributed to her symptoms.  6.  Acute kidney injury: Creatinine remains elevated.  She is on IV fluids.  7.  Hyperlipidemia: Currently on pravastatin.  [facility] will sign off.   Medication Recommendations: As above Other recommendations (labs, testing, etc): None Follow up as an outpatient: Both the patient and her husband expressed a desire to begin cardiology follow-up in [place] as it is cumbersome for them to travel to [facility] to see Dr. Einar Gip.  I will arrange for follow-up with APP.  For questions or updates, please contact [facility] Please consult [email or web address] for contact info under Cardiology/STEMI.      Signed, Kate Sable, MD  06/17/2018, 9:45 AM

## 2018-06-17 NOTE — Progress Notes (Signed)
ANTICOAGULATION CONSULT NOTE - Follow Up Consult  Pharmacy Consult for warfarin dosing Indication: atrial fibrillation / Hx DVT Concurrent anti-platelet meds: Plavix   Allergies  Allergen Reactions  . Lactose Intolerance (Gi) Other (See Comments)    G.I. Upset  . Butrans [Buprenorphine] Rash and Other (See Comments)    Infected skin underneath application  . Simvastatin Rash    Patient Measurements: Height: 5\' 6"  (167.6 cm) Weight: 224 lb (101.6 kg) IBW/kg (Calculated) : 59.3 Heparin Dosing Weight:  HEPARIN DW (KG): 82.4  Vital Signs: Temp: 97.6 F (36.4 C) (01/07 0623) Temp Source: Oral (01/07 0623) BP: 124/68 (01/07 0623) Pulse Rate: 79 (01/07 0623)  Labs: Recent Labs    06/15/18 0609 06/15/18 1016 06/16/18 0555 06/17/18 0439  LABPROT 27.7*  --  25.2* 23.0*  INR 2.63  --  2.33 2.07  CREATININE 1.90*  --  1.75* 1.81*  CKTOTAL  --  14*  --   --     Estimated Creatinine Clearance: 33.3 mL/min (A) (by C-G formula based on SCr of 1.81 mg/dL (H)).   Assessment: Pharmacy consulted to dose warfarin for this [name] admitted for weakness. She has been on chronic warfarin therapy for  atrial fibrillation and history of DVT.  Patient's home regimen is warfarin 1mg  daily. INR remains within  goal range today at 2.07.   Goal of Therapy:  INR 2-3 Monitor platelets by anticoagulation protocol: Yes   Plan:  Give warfarin 2mg  x1 dose today  Obtain daily INR and every- other- day CBC Monitor for signs and symptoms of bleeding.  Revonda Standard Olivette Beckmann 06/17/2018,9:52 AM

## 2018-06-18 ENCOUNTER — Other Ambulatory Visit (HOSPITAL_COMMUNITY): Payer: Self-pay | Admitting: Surgery

## 2018-06-18 ENCOUNTER — Other Ambulatory Visit: Payer: Medicare Other

## 2018-06-18 ENCOUNTER — Inpatient Hospital Stay: Payer: Medicare Other | Admitting: Infectious Disease

## 2018-06-18 DIAGNOSIS — L0291 Cutaneous abscess, unspecified: Secondary | ICD-10-CM

## 2018-06-19 ENCOUNTER — Other Ambulatory Visit: Payer: Medicare Other

## 2018-06-19 ENCOUNTER — Other Ambulatory Visit: Payer: Self-pay | Admitting: *Deleted

## 2018-06-19 NOTE — Patient Outreach (Signed)
[facility]) Care Management  06/19/2018  Betsaida [place] Lebon 1945-10-25 [id number]   Onsite visit to [facility] Met with patient  Patient reports that she had a fall and when she was checked out they recommended SNF.  Patient states that [facility] did not have an available bed.   Patient still agrees to have [facility] care management upon discharge from facility. She feels she will be there about 2 weeks.   Plan to monitor for discharge to collaborate with [facility] care team. Royetta Crochet. Laymond Purser, MSN, RN, [employer] , [place] [phone]) Business Cell  [phone]) Toll Free Office

## 2018-06-20 ENCOUNTER — Encounter (HOSPITAL_COMMUNITY): Payer: Self-pay | Admitting: Interventional Radiology

## 2018-06-20 ENCOUNTER — Ambulatory Visit (HOSPITAL_COMMUNITY)
Admission: RE | Admit: 2018-06-20 | Discharge: 2018-06-20 | Disposition: A | Discharge status: Home / Self Care | Payer: Medicare Other | Source: Ambulatory Visit | Attending: Surgery | Admitting: Surgery

## 2018-06-20 DIAGNOSIS — L0291 Cutaneous abscess, unspecified: Secondary | ICD-10-CM | POA: Diagnosis not present

## 2018-06-20 DIAGNOSIS — Z4803 Encounter for change or removal of drains: Secondary | ICD-10-CM | POA: Diagnosis not present

## 2018-06-20 HISTORY — PX: IR SINUS/FIST TUBE CHK-NON GI: IMG673

## 2018-06-20 MED ORDER — IOPAMIDOL (ISOVUE-300) INJECTION 61%
INTRAVENOUS | Status: AC
  Administered 2018-06-20: 10 mL
  Filled 2018-06-20: qty 50

## 2018-06-20 NOTE — Progress Notes (Signed)
Location:    [facility] Room Number: 144/P Place of Service:  SNF [phone]) Provider:  Cory Roughen, MD  Patient Care Team: [name], MD as PCP - General (Internal Medicine) Neldon Labella, RN as Registered Nurse  Extended Emergency Contact Information Primary Emergency Contact: Pascual,Robert Address: [address]           [place], [place] [address] [place] Litter of [place] Phone: [phone] Mobile Phone: [phone] Relation: Spouse  Code Status:  Full Code Goals of care: Advanced Directive information Advanced Directives 06/12/2018  Does Patient Have a Medical Advance Directive? Yes  Type of Advance Directive (No Data)  Does patient want to make changes to medical advance directive? No - Patient declined  Would patient like information on creating a medical advance directive? No - Patient declined  Pre-existing out of facility DNR order (yellow form or pink MOST form) -     Chief Complaint  Patient presents with  . Acute Visit    F/U Low Magnesium  As well as chronic kidney disease in addition to history of perforated sigmoid diverticulitis required surgery and placement of drains  HPI:  Pt is a 73 y.o. female seen today for an acute visit for follow-up of low magnesium as well as chronic kidney disease with rising creatinine in addition to perforated sigmoid diverticulitis that required surgery and she has drains placed.  Patient went to the ER on December 31 secondary to 1 of her drains tubes coming out-as well as continued low magnesium and some increased renal insufficiency-.  In the ER she was thought to be stable- her magnesium was up to 1.4- creatinine was elevated somewhat at 1.75 with a BUN of 32 apparently she received some IV fluids and some IV magnesium. Her level actually is 1.5 today-she is now on slow magnesium 3 times a day   surgery was contacted as well and it was thought there was no apparent reason to admit  patient.  She did have her PICC line replace it appears as well.  She has returned to facility other than complaining being weak she has no complaints.  She did have a CT scan this morning of her abdomen ----surgery did review the abdominal and pelvic x-ray and did not see any areas of concern she does have a renal stone but it is nonobstructing.  It did not show evidence of an intra-abdominal or pelvic abscess currently- percutaneous catheter with incompletely drained abscess in the right upper pelvis- it did show diffuse colonic diverticulosis but no evidence of diverticulitis.  It was negative for hydronephrosis  She does have a history of right upper extremity DVT recent as well as suspected atrial fibrillation-EKG in the ER did show A. fib.  She is on Coumadin INR is therapeutic today 2.72 she is back on her baseline dose of 1.5 mg she did receive 3 mg for a couple days to try to boost her into therapeutic range.  Her metabolic panel does show her creatinine has risen somewhat up to 1.92 with a BUN of 32.  Sodium and potassium remain in normal range calcium is minimally low at 8.8 CO2 level was slightly low at 18.   It appears her vital signs are stable other than feeling weak she does not really have any complaints    Past Medical History:  Diagnosis Date  . Anginal pain [facility])    sees Dr Einar Gip.   . Arthritis    rheumatoid .Marland Kitchen   Marland Kitchen  Diabetes mellitus without complication (HCC)    Metformin 2.3.2017  . Diverticulitis   . Diverticulitis of large intestine with perforation and abscess 04/05/2018  . Dysrhythmia   . Fibromyalgia   . GERD (gastroesophageal reflux disease)   . High cholesterol   . Hypertension   . Liver disease 08- 2012   per Dr Dagmar Hait pt has enlarged liver  . Peripheral vascular disease (HCC)    legs  . Pneumonia 06/17/2016  . Sleep apnea    sleep study  oct 2012  . Stroke [facility]) 2011   [facility] [place]     Past Surgical History:  Procedure Laterality Date    . [place]  . CHOLECYSTECTOMY  1996  . COLONOSCOPY N/A 02/13/2018   Procedure: COLONOSCOPY;  Surgeon: Rogene Houston, MD;  [facility];  Service: Endoscopy;  Laterality: N/A;  8:30  . CORONARY ARTERY BYPASS GRAFT  1986  . ESOPHAGEAL DILATION N/A 01/13/2018   Procedure: ESOPHAGEAL DILATION;  Surgeon: Rogene Houston, MD;  [facility];  Service: Endoscopy;  Laterality: N/A;  . ESOPHAGOGASTRODUODENOSCOPY N/A 01/13/2018   Procedure: ESOPHAGOGASTRODUODENOSCOPY (EGD);  Surgeon: Rogene Houston, MD;  [facility];  Service: Endoscopy;  Laterality: N/A;  . EYE SURGERY  2013   cat ext bilateral .  . EYE SURGERY  2002   laser  . FOREIGN BODY REMOVAL  02/13/2018   Procedure: FOREIGN BODY REMOVAL;  Surgeon: Rogene Houston, MD;  [facility];  Service: Endoscopy;;  foreign body removal which appears to be food debri in a stem form removed from colon by DR. Rehman  . [place]- 2007   rod right leg - right wrist plate  . GAS/FLUID EXCHANGE  03/25/2012   Procedure: GAS/FLUID EXCHANGE;  Surgeon: Hayden Pedro, MD;  Location: [place];  Service: Ophthalmology;  Laterality: Right;  . LAPAROTOMY N/A 04/30/2018   Procedure: EXPLORATORY LAPAROTOMY  LYSIS OF ADHESIONS SMALL BOWEL RESECTION WITH ANASTOMSIS LOOP ILEOSTOMY PLACEMENT OF DRAINS AND WOUND [place];  Surgeon: Donnie Mesa, MD;  Location: [place];  Service: General;  Laterality: N/A;  . PARS PLANA VITRECTOMY  03/25/2012   Procedure: PARS PLANA VITRECTOMY WITH 25 GAUGE;  Surgeon: Hayden Pedro, MD;  Location: [place];  Service: Ophthalmology;  Laterality: Right;    Allergies  Allergen Reactions  . Lactose Intolerance (Gi) Other (See Comments)    G.I. Upset  . Butrans [Buprenorphine] Rash and Other (See Comments)    Infected skin underneath application  . Simvastatin Rash    Outpatient Encounter Medications as of 06/12/2018  Medication Sig  . clopidogrel (PLAVIX) 75  MG tablet Take 1 tablet (75 mg total) by mouth daily with breakfast.  . diclofenac sodium (VOLTAREN) 1 % GEL Apply 2 g topically 4 (four) times daily as needed (Pain).   Marland Kitchen diltiazem (CARDIZEM CD) 120 MG 24 hr capsule Take 1 capsule (120 mg total) by mouth daily.  . ferrous sulfate 325 (65 FE) MG tablet Take 1 tablet (325 mg total) by mouth 2 (two) times daily with a meal.  . HYDROcodone-acetaminophen (NORCO) 10-325 MG tablet Take 1 tablet by mouth 4 (four) times daily.  . insulin aspart (NOVOLOG FLEXPEN) 100 UNIT/ML FlexPen Give per sliding scale 2 times a day, 140-199 - 2 units, 200-250 - 4 units, 251-299 - 6 units,  300-349 - 8 units,  350 or above 10 units. Dispense syringes and needles as needed, Ok to switch  to PEN if approved. Substitute to any brand approved. DX DM2, Code E11.65  . isosorbide dinitrate (ISORDIL) 5 MG tablet Take 1 tablet (5 mg total) by mouth 3 (three) times daily before meals.  Marland Kitchen leflunomide (ARAVA) 20 MG tablet Take 20 mg by mouth daily.  . metoprolol succinate (TOPROL-XL) 25 MG 24 hr tablet Take 25 mg by mouth daily.  Marland Kitchen PARoxetine (PAXIL) 20 MG tablet Take 20 mg by mouth daily.  . potassium chloride SA (K-DUR,KLOR-CON) 20 MEQ tablet Take 20 mEq by mouth daily.  . pravastatin (PRAVACHOL) 40 MG tablet Take 40 mg by mouth at bedtime.  . predniSONE (DELTASONE) 5 MG tablet Take 5 mg by mouth daily with breakfast.  . pregabalin (LYRICA) 50 MG capsule Take 1 capsule (50 mg total) by mouth 3 (three) times daily.  Marland Kitchen SLOW-MAG 71.5-119 MG TBEC SR tablet Give 143 mg by mouth three times a day  . Tafluprost (ZIOPTAN) 0.0015 % SOLN Place 1 drop into both eyes at bedtime.   . temazepam (RESTORIL) 30 MG capsule Take 1 capsule (30 mg total) by mouth at bedtime.  . timolol (BETIMOL) 0.5 % ophthalmic solution Place 1 drop into both eyes 2 (two) times daily.   . Tofacitinib Citrate (XELJANZ) 5 MG TABS Take 1 tablet by mouth daily.  . Vitamin D, Ergocalciferol, (DRISDOL) 1.25 MG (50000 UT)  CAPS capsule Take 50,000 Units by mouth every Thursday.   . warfarin (COUMADIN) 1 MG tablet Take 1.5 mg by mouth every evening.   . [DISCONTINUED] fluconazole (DIFLUCAN) 200 MG tablet Take 2 tablets (400 mg total) by mouth daily. (Patient taking differently: Take 400 mg by mouth daily. 4 week course starting on 05/14/2018)  . [DISCONTINUED] magnesium chloride (SLOW-MAG) 64 MG TBEC SR tablet Take 2 tablets by mouth 3 (three) times daily.  . [DISCONTINUED] Magnesium Oxide 400 (240 Mg) MG TABS Take 1 tablet (400 mg total) by mouth daily.   No facility-administered encounter medications on file as of 06/12/2018.     Review of Systems   In general she is not complaining of any fever chills appears she possibly has lost about 15 pounds since her admission although this appears to have stabilized over the past week or so  Skin she is not complaining of rashes or itching she does have a wound VAC over abdominal wound.  Head ears eyes nose mouth and throat is not complaining of a sore throat or visual changes.  Respiratory does not complain of being short of breath or having a cough.  Cardiac is not complaining of chest pain she has mild lower extremity edema.  GI is not really complaining of abdominal pain she does have an extensive history with grains as well as an ileostomy and a wound VAC.  Does not complain of constipation diarrhea nausea or vomiting.  GU is not complaining of dysuria.  Musculoskeletal is not complaining of pain does complain of generally feeling weak.  Neurologic does not complain of dizziness headache or syncope does complain of weakness.  And psych at times does appear to be somewhat depressed she has been started on Paxil appears later in the day when she gets tired she appears to be a bit more down.    Immunization History  Administered Date(s) Administered  . Influenza Split 03/26/2012, 03/11/2013  . Influenza, High Dose Seasonal PF 04/07/2018   Pertinent   Health Maintenance Due  Topic Date Due  . FOOT EXAM  06/14/2018 (Originally 08/14/1955)  . MAMMOGRAM  06/14/2018 (  Originally 11/17/2016)  . OPHTHALMOLOGY EXAM  06/14/2018 (Originally 08/14/1955)  . URINE MICROALBUMIN  06/14/2018 (Originally 08/14/1955)  . DEXA SCAN  06/14/2018 (Originally 08/13/2009)  . PNA vac Low Risk Adult (1 of 2 - PCV13) 06/14/2018 (Originally 08/13/2009)  . HEMOGLOBIN A1C  10/07/2018  . COLONOSCOPY  02/14/2028  . INFLUENZA VACCINE  Completed   No flowsheet data found. Functional Status Survey:    Vitals:   06/12/18 1641  BP: (!) 147/81  Pulse: 60  Resp: 18  Temp: 98.2 F (36.8 C)  TempSrc: Oral  SpO2: 96%   Pulse on auscultation was between 80- to high 90s.   Physical Exam In general this is a pleasant somewhat obese elderly female in no distress.  Her skin is warm and dry.  Eyes visual acuity appears to be intact sclera and conjunctive are clear.  Oropharynx clear mucous membranes moist.  Chest is clear to auscultation there is no labored breathing somewhat shallow air entry.  Heart is irregular irregular rate and rhythm in the 90s when I later evaluated when she was resting it was in the 80s.  She has mild lower extremity edema.  Her abdomen is somewhat obese soft does not appear to be tender she does have drains in place as well as an ileostomy with a moderate amount of liquidy stool-.  She does have a wound VAC in place over abdominal wound.  Musculoskeletal is able to move all extremities x4 it appears at relative baseline but does complain of weakness especially of her lower extremities-.  Neurologic is grossly intact cannot appreciate lateralizing findings speech is clear.  Psych she is alert and oriented pleasant and appropriate.   Labs reviewed: Recent Labs    05/06/18 0406 05/07/18 0348  05/09/18 0429  06/10/18 0700 06/10/18 1314 06/11/18 0650 06/12/18 0700  NA 143 141   < > 136   < > 138 137 140 139  K 4.0 3.9   < > 3.3*   < >  4.5 5.0 4.5 4.5  CL 102 101   < > 101   < > 111 111 112* 113*  CO2 31 30   < > 28   < > 19* 15* 19* 18*  GLUCOSE 113* 121*   < > 90   < > 104* 144* 94 122*  BUN 50* 48*   < > 32*   < > 29* 32* 32* 32*  CREATININE 1.14* 1.12*   < > 1.00   < > 1.60* 1.75* 1.73* 1.92*  CALCIUM 8.1* 7.9*   < > 7.8*   < > 8.7* 8.7* 8.9 8.8*  MG 1.8 1.5*   < > 1.8   < > 1.2* 1.4*  --  1.5*  PHOS 5.7* 4.7*  --  3.6  --   --   --   --   --    < > = values in this interval not displayed.   Recent Labs    05/05/18 0351 05/07/18 0348 05/13/18 0412  AST 64* 44* 22  ALT 47* 42 16  ALKPHOS 93 101 81  BILITOT 0.4 0.6 0.6  PROT 4.9* 5.0* 4.9*  ALBUMIN 1.7* 1.8* 1.6*   Recent Labs    06/06/18 1207 06/08/18 1220 06/10/18 1314 06/11/18 0650  WBC 9.0 7.1 7.6 6.9  NEUTROABS 7.2 5.3  --  4.6  HGB 10.0* 10.3* 10.3* 10.3*  HCT 33.6* 34.9* 35.7* 35.1*  MCV 104.3* 103.9* 105.9* 105.7*  PLT 352 377 379 368   Lab  Results  Component Value Date   TSH 7.944 (H) 05/07/2018   Lab Results  Component Value Date   HGBA1C 5.9 (H) 04/07/2018   Lab Results  Component Value Date   CHOL 145 06/21/2013   HDL 45 06/21/2013   LDLCALC 65 06/21/2013   TRIG 185 (H) 05/05/2018   CHOLHDL 3.2 06/21/2013    Significant Diagnostic Results in last 30 days:  Ct Abdomen Pelvis Wo Contrast  Result Date: 06/12/2018 CLINICAL DATA:  73 year old who underwent laparotomy 04/30/2018 for perforated sigmoid diverticulitis and multiple intra-abdominal abscesses for which she underwent lysis of adhesions, short segment small-bowel resection and diverting loop ileostomy. She presents now with abdominal pain and fever. EXAM: CT ABDOMEN AND PELVIS WITHOUT CONTRAST TECHNIQUE: Multidetector CT imaging of the abdomen and pelvis was performed following the standard protocol without IV contrast. Oral contrast was administered. COMPARISON:  05/07/2018, 04/27/2018 and earlier. FINDINGS: Lower chest: Mild scarring and bronchiectasis involving the lower  lobes. Scarring involving the RIGHT MIDDLE LOBE. No new abnormalities involving the lung bases. Heart mildly enlarged but stable. Mitral annular calcification again noted. Hepatobiliary: Normal unenhanced appearance of the liver. Surgically absent gallbladder. No biliary ductal dilation. Pancreas: Mild diffuse atrophy. Otherwise normal unenhanced appearance. Spleen: Normal unenhanced appearance. Adrenals/Urinary Tract: Normal appearing adrenal glands. Non-obstructing approximate 4 mm calculus in a LOWER pole calyx of the RIGHT kidney. No urinary tract calculi elsewhere. No hydronephrosis. Allowing for the unenhanced technique, no focal parenchymal abnormality involving either kidney. Small diverticulum arising from the RIGHT posterolateral wall of the urinary bladder. Opaque material within the dependent portion of the urinary bladder. Stomach/Bowel: Normal-appearing stomach containing opaque ingested tablets. Normal-appearing small bowel. Small bowel surgical anastomosis in the LEFT mid abdomen at the level of the umbilicus. Normal-appearing ileostomy in the RIGHT mid abdomen. Contrast material passes through the small bowel into the ostomy bag without obstruction. Decompressed colon with diffuse diverticulosis, including extensive descending and sigmoid colon diverticulosis. No evidence of diverticulitis. Vascular/Lymphatic: Severe aortoiliofemoral atherosclerosis without evidence of aneurysm. No pathologic lymphadenopathy. Reproductive: Normal-appearing uterus and ovaries without evidence of adnexal mass. Other: Percutaneous catheter within a completely drained abscess in the RIGHT UPPER pelvis. Surgical drain in the pelvis. No evidence of intra-abdominal or pelvic abscess currently. No ascites. Midline surgical incision without evidence of incisional hernia or abscess. Musculoskeletal: Multilevel degenerative disc disease, spondylosis and facet degenerative changes throughout the lumbar spine. Degenerative  changes involving both hips. No acute findings. IMPRESSION: 1. No acute abnormalities involving the abdomen or pelvis. 2. No evidence of intra-abdominal or pelvic abscess currently. Percutaneous catheter within a completely drained abscess in the RIGHT UPPER pelvis. 3. Non-obstructing approximate 4 mm calculus in a LOWER pole calyx of the RIGHT kidney. No urinary tract calculi elsewhere. 4. Diffuse colonic diverticulosis, including extensive descending and sigmoid colon diverticulosis. No evidence of diverticulitis. Aortic Atherosclerosis (ICD10-170.0) Electronically Signed   By: Evangeline Dakin M.D.   On: 06/12/2018 11:21   Ct Head Wo Contrast  Result Date: 06/08/2018 CLINICAL DATA:  Patient status post fall. No reported loss of consciousness. EXAM: CT HEAD WITHOUT CONTRAST CT CERVICAL SPINE WITHOUT CONTRAST TECHNIQUE: Multidetector CT imaging of the head and cervical spine was performed following the standard protocol without intravenous contrast. Multiplanar CT image reconstructions of the cervical spine were also generated. COMPARISON:  Cervical spine CT 06/27/2016 FINDINGS: CT HEAD FINDINGS Brain: Ventricles and sulci are prominent compatible with atrophy. Periventricular and subcortical white matter hypodensity compatible with chronic microvascular ischemic changes. No evidence for acute cortically based  infarct, intracranial hemorrhage, mass lesion or mass-effect. Basal ganglia calcifications. Vascular: Unremarkable Skull: Intact. Sinuses/Orbits: Paranasal sinuses are well aerated. Mastoid air cells are unremarkable. Orbits are unremarkable. Other: None. CT CERVICAL SPINE FINDINGS Alignment: Normal anatomic alignment. Skull base and vertebrae: No acute fracture. No primary bone lesion or focal pathologic process. Soft tissues and spinal canal: No prevertebral fluid or swelling. No visible canal hematoma. Disc levels: Multilevel degenerative disc disease most pronounced C5-6 and C6-7. no acute fracture.  Upper chest: Unremarkable. Other: Bilateral carotid arterial vascular calcifications. IMPRESSION: No acute intracranial process. No acute cervical spine fracture. Electronically Signed   By: Lovey Newcomer M.D.   On: 06/08/2018 13:41   Ct Cervical Spine Wo Contrast  Result Date: 06/08/2018 CLINICAL DATA:  Patient status post fall. No reported loss of consciousness. EXAM: CT HEAD WITHOUT CONTRAST CT CERVICAL SPINE WITHOUT CONTRAST TECHNIQUE: Multidetector CT imaging of the head and cervical spine was performed following the standard protocol without intravenous contrast. Multiplanar CT image reconstructions of the cervical spine were also generated. COMPARISON:  Cervical spine CT 06/27/2016 FINDINGS: CT HEAD FINDINGS Brain: Ventricles and sulci are prominent compatible with atrophy. Periventricular and subcortical white matter hypodensity compatible with chronic microvascular ischemic changes. No evidence for acute cortically based infarct, intracranial hemorrhage, mass lesion or mass-effect. Basal ganglia calcifications. Vascular: Unremarkable Skull: Intact. Sinuses/Orbits: Paranasal sinuses are well aerated. Mastoid air cells are unremarkable. Orbits are unremarkable. Other: None. CT CERVICAL SPINE FINDINGS Alignment: Normal anatomic alignment. Skull base and vertebrae: No acute fracture. No primary bone lesion or focal pathologic process. Soft tissues and spinal canal: No prevertebral fluid or swelling. No visible canal hematoma. Disc levels: Multilevel degenerative disc disease most pronounced C5-6 and C6-7. no acute fracture. Upper chest: Unremarkable. Other: Bilateral carotid arterial vascular calcifications. IMPRESSION: No acute intracranial process. No acute cervical spine fracture. Electronically Signed   By: Lovey Newcomer M.D.   On: 06/08/2018 13:41   Dg Chest Port 1 View  Result Date: 06/10/2018 CLINICAL DATA:  PICC placement. EXAM: PORTABLE CHEST 1 VIEW COMPARISON:  04/11/2018 FINDINGS: PICC tip is  in the superior vena cava at the level of the azygos vein. Heart size and vascularity are normal and the lungs are clear. CABG. No acute bone abnormality. IMPRESSION: PICC in good position. No acute cardiopulmonary findings. Electronically Signed   By: Lorriane Shire M.D.   On: 06/10/2018 17:51   Dg Chest Port 1v Same Day  Result Date: 06/10/2018 CLINICAL DATA:  PICC placement. EXAM: PORTABLE CHEST 1 VIEW 6:15 p.m. COMPARISON:  06/10/2018 at 5:24 p.m. FINDINGS: PICC tip remains unchanged in position in the superior vena cava at the level of the azygos vein. Heart size and vascularity are normal. Lungs are clear. CABG. IMPRESSION: No change in the PICC tip in the superior vena cava. No acute cardiopulmonary findings. Electronically Signed   By: Lorriane Shire M.D.   On: 06/10/2018 18:37   Dg Humerus Left  Result Date: 06/10/2018 CLINICAL DATA:  PICC line placement EXAM: LEFT HUMERUS - 2+ VIEW COMPARISON:  None. FINDINGS: There is a PICC line identified in the upper left arm in the expected course. The distal portion of the PICC line is not included on film. IMPRESSION: There is a PICC line identified in the upper left arm in the expected course. The distal portion of the PICC line is not included on film. Electronically Signed   By: Abelardo Diesel M.D.   On: 06/10/2018 18:02   Ir Picc Replacement Leftinc Imgguide  Result Date: 05/19/2018 INDICATION: Malfunctioning PICC line. Diverticular abscess. Need to continue IV antibiotics. EXAM: FLUOROSCOPIC GUIDED PICC LINE EXCHANGE TECHNIQUE: The procedure, risks, benefits, and alternatives were explained to the patient and informed written consent was obtained. The left upper extremity and external portion of the existing PICC line was prepped with chlorhexidine in a sterile fashion, and a sterile drape was applied covering the operative field. Maximum barrier sterile technique with sterile gowns and gloves were used for the procedure. A timeout was performed  prior to the initiation of the procedure. Local anesthesia was provided with 1% lidocaine. The existing PICC line was cannulated with an 0.0018 wire which was advanced through the catheter. The catheter was exchanged for a peel-away sheath, ultimately allowing advancement of a 46-cm, 5- [place], double lumen PICC line to the level of the superior caval atrial junction. A post procedure spot fluoroscopic image was obtained. The catheter easily aspirated and flushed and was secured in place. A dressing was placed. The patient tolerated the procedure well without immediate post procedural complication. FINDINGS: After catheter exchange, the tip lies within the superior cavoatrial junction. The catheter aspirates and flushes normally, it was capped, and is ready for immediate use. IMPRESSION: Successful fluoroscopic guided exchange of left upper extremity approach 46 cm, 5 - [place], double lumen PICC with tip overlying the superior caval atrial junction. The PICC line is ready for immediate use. Read by: Gareth Eagle, PA-C Electronically Signed   By: Markus Daft M.D.   On: 05/19/2018 13:50    Assessment/Plan #1- history of low magnesium this appears to be improving with a level of 1.5 on lab done today will recheck this tomorrow before possible discharge.  She has been started on slow magnesium 3 times a day  2.  Rising creatinine this appears to be slowly rising with a creatinine of 1.92 as noted above- CT scan today was negative for hydronephrosis- this will need to be rechecked tomorrow before discharge- fluids also will be encouraged.  3.  History of perforated sigmoid diverticulitis with drains placed-again her CT scan was reassuring today as noted above-this has been reviewed by surgery and they feel she is suitable to go home.  And they will follow-up as an outpatient.  4.-History of atrial fibrillation she continues on Coumadin INR is therapeutic today will update this tomorrow-she continues on  diltiazem and Lopressor for rate control.  5.  History of type 2 diabetes- she continues on sliding scale this appears stable with blood sugars largely in the lower 100s.  6.  History of rheumatoid arthritis she continues on prednisone as well as Leflunomide addition to [place]  #7 history of fibromyalgia she is on Lyrica will reduce her dose down to 25 mg 3 times daily because of her renal insufficiency.  8.  Hemoglobin appears to be stable in the tens range- she is on iron-her Dexilant has been discontinued because it may interact with her magnesium.  Absorption.  9.  History of coronary artery disease this appears to be stable on Plavix as well as Lopressor and a statin.  10.  History of insomnia she is on Restoril at night appears to have tolerated this fairly well.  11.  Depression she has been started on Paxil   #12 history of apparent hypokalemia in the past she is on 20 mEq of potassium a day-- potassium has been stable despite her low magnesium   [id number]-of note greater than 35 minutes spent assessing patient reviewing her chart and labs-  coordinating and formulating a plan of care for numerous diagnoses note  greater than 50% of time spent coordinating plan of care with input including review of recent studies

## 2018-06-30 DIAGNOSIS — Z7901 Long term (current) use of anticoagulants: Secondary | ICD-10-CM | POA: Diagnosis not present

## 2018-06-30 DIAGNOSIS — Z5181 Encounter for therapeutic drug level monitoring: Secondary | ICD-10-CM | POA: Diagnosis not present

## 2018-07-06 DIAGNOSIS — E872 Acidosis: Secondary | ICD-10-CM | POA: Diagnosis not present

## 2018-07-06 DIAGNOSIS — R0602 Shortness of breath: Secondary | ICD-10-CM | POA: Diagnosis not present

## 2018-07-06 DIAGNOSIS — I4891 Unspecified atrial fibrillation: Secondary | ICD-10-CM | POA: Diagnosis not present

## 2018-07-06 DIAGNOSIS — R0902 Hypoxemia: Secondary | ICD-10-CM | POA: Diagnosis not present

## 2018-07-06 DIAGNOSIS — I5033 Acute on chronic diastolic (congestive) heart failure: Secondary | ICD-10-CM | POA: Diagnosis not present

## 2018-07-06 DIAGNOSIS — J15212 Pneumonia due to Methicillin resistant Staphylococcus aureus: Secondary | ICD-10-CM | POA: Diagnosis not present

## 2018-07-06 DIAGNOSIS — N390 Urinary tract infection, site not specified: Secondary | ICD-10-CM | POA: Diagnosis not present

## 2018-07-06 DIAGNOSIS — A419 Sepsis, unspecified organism: Secondary | ICD-10-CM | POA: Diagnosis not present

## 2018-07-06 DIAGNOSIS — J189 Pneumonia, unspecified organism: Secondary | ICD-10-CM | POA: Diagnosis not present

## 2018-07-06 DIAGNOSIS — J156 Pneumonia due to other aerobic Gram-negative bacteria: Secondary | ICD-10-CM | POA: Diagnosis not present

## 2018-07-08 DIAGNOSIS — A419 Sepsis, unspecified organism: Secondary | ICD-10-CM | POA: Diagnosis present

## 2018-07-08 DIAGNOSIS — E875 Hyperkalemia: Secondary | ICD-10-CM | POA: Diagnosis present

## 2018-07-08 DIAGNOSIS — I872 Venous insufficiency (chronic) (peripheral): Secondary | ICD-10-CM | POA: Diagnosis present

## 2018-07-08 DIAGNOSIS — G8929 Other chronic pain: Secondary | ICD-10-CM | POA: Diagnosis present

## 2018-07-08 DIAGNOSIS — E872 Acidosis: Secondary | ICD-10-CM | POA: Diagnosis present

## 2018-07-08 DIAGNOSIS — N390 Urinary tract infection, site not specified: Secondary | ICD-10-CM | POA: Diagnosis present

## 2018-07-08 DIAGNOSIS — R0902 Hypoxemia: Secondary | ICD-10-CM | POA: Diagnosis not present

## 2018-07-08 DIAGNOSIS — E119 Type 2 diabetes mellitus without complications: Secondary | ICD-10-CM | POA: Diagnosis present

## 2018-07-08 DIAGNOSIS — R630 Anorexia: Secondary | ICD-10-CM | POA: Diagnosis present

## 2018-07-08 DIAGNOSIS — J189 Pneumonia, unspecified organism: Secondary | ICD-10-CM | POA: Diagnosis not present

## 2018-07-08 DIAGNOSIS — I5033 Acute on chronic diastolic (congestive) heart failure: Secondary | ICD-10-CM | POA: Diagnosis present

## 2018-07-08 DIAGNOSIS — I4891 Unspecified atrial fibrillation: Secondary | ICD-10-CM | POA: Diagnosis present

## 2018-07-08 DIAGNOSIS — R000 Tachycardia, unspecified: Secondary | ICD-10-CM | POA: Diagnosis not present

## 2018-07-08 DIAGNOSIS — Z452 Encounter for adjustment and management of vascular access device: Secondary | ICD-10-CM | POA: Diagnosis not present

## 2018-07-08 DIAGNOSIS — I4819 Other persistent atrial fibrillation: Secondary | ICD-10-CM | POA: Diagnosis not present

## 2018-07-08 DIAGNOSIS — E1165 Type 2 diabetes mellitus with hyperglycemia: Secondary | ICD-10-CM | POA: Diagnosis not present

## 2018-07-08 DIAGNOSIS — M797 Fibromyalgia: Secondary | ICD-10-CM | POA: Diagnosis present

## 2018-07-08 DIAGNOSIS — R062 Wheezing: Secondary | ICD-10-CM | POA: Diagnosis not present

## 2018-07-08 DIAGNOSIS — Z932 Ileostomy status: Secondary | ICD-10-CM | POA: Diagnosis not present

## 2018-07-08 DIAGNOSIS — M545 Low back pain: Secondary | ICD-10-CM | POA: Diagnosis present

## 2018-07-08 DIAGNOSIS — B9689 Other specified bacterial agents as the cause of diseases classified elsewhere: Secondary | ICD-10-CM | POA: Diagnosis present

## 2018-07-08 DIAGNOSIS — E876 Hypokalemia: Secondary | ICD-10-CM | POA: Diagnosis not present

## 2018-07-08 DIAGNOSIS — R0602 Shortness of breath: Secondary | ICD-10-CM | POA: Diagnosis not present

## 2018-07-08 DIAGNOSIS — Z8673 Personal history of transient ischemic attack (TIA), and cerebral infarction without residual deficits: Secondary | ICD-10-CM | POA: Diagnosis not present

## 2018-07-08 DIAGNOSIS — J9811 Atelectasis: Secondary | ICD-10-CM | POA: Diagnosis not present

## 2018-07-08 DIAGNOSIS — N179 Acute kidney failure, unspecified: Secondary | ICD-10-CM | POA: Diagnosis present

## 2018-07-08 DIAGNOSIS — Z4682 Encounter for fitting and adjustment of non-vascular catheter: Secondary | ICD-10-CM | POA: Diagnosis not present

## 2018-07-08 DIAGNOSIS — J15212 Pneumonia due to Methicillin resistant Staphylococcus aureus: Secondary | ICD-10-CM | POA: Diagnosis present

## 2018-07-08 DIAGNOSIS — Z6833 Body mass index (BMI) 33.0-33.9, adult: Secondary | ICD-10-CM | POA: Diagnosis not present

## 2018-07-08 DIAGNOSIS — M069 Rheumatoid arthritis, unspecified: Secondary | ICD-10-CM | POA: Diagnosis present

## 2018-07-08 DIAGNOSIS — I110 Hypertensive heart disease with heart failure: Secondary | ICD-10-CM | POA: Diagnosis present

## 2018-07-08 DIAGNOSIS — I2510 Atherosclerotic heart disease of native coronary artery without angina pectoris: Secondary | ICD-10-CM | POA: Diagnosis present

## 2018-07-08 DIAGNOSIS — J156 Pneumonia due to other aerobic Gram-negative bacteria: Secondary | ICD-10-CM | POA: Diagnosis present

## 2018-07-08 DIAGNOSIS — E785 Hyperlipidemia, unspecified: Secondary | ICD-10-CM | POA: Diagnosis present

## 2018-07-15 ENCOUNTER — Other Ambulatory Visit: Payer: Self-pay | Admitting: *Deleted

## 2018-07-15 NOTE — Patient Outreach (Signed)
[facility]) Care Management  07/15/2018  [name] 11/04/1945 [id number]   Noted per patient ping that patient discharged out to the hospital from [facility] and then discharged home from hospital instead of returning to facility on 07/09/2018.  Will place a new referral for transition of care, as per [place] management policy referral had been closed per workflow due to readmission and SNF longer than 10 days.  Royetta Crochet. Laymond Purser, MSN, RN, [employer] , [place] [phone]) Business Cell  [phone]) Toll Free Office

## 2018-07-16 ENCOUNTER — Other Ambulatory Visit: Payer: Self-pay

## 2018-07-16 NOTE — Patient Outreach (Signed)
[place] [facility]) Care Management  07/16/2018  Tiahna Cure [place]enda 1946-05-19 [id number]  Unsuccessful outreach attempt. Left HIPAA compliant voice message requesting a return call.   PLAN Will send unsuccesful outreach letter. Will follow up within 3-4 business days.   [place] [phone]

## 2018-07-17 ENCOUNTER — Ambulatory Visit: Payer: Self-pay | Admitting: Cardiology

## 2018-07-18 DIAGNOSIS — R0989 Other specified symptoms and signs involving the circulatory and respiratory systems: Secondary | ICD-10-CM | POA: Diagnosis not present

## 2018-07-18 DIAGNOSIS — R192 Visible peristalsis: Secondary | ICD-10-CM | POA: Diagnosis not present

## 2018-07-18 DIAGNOSIS — J156 Pneumonia due to other aerobic Gram-negative bacteria: Secondary | ICD-10-CM | POA: Diagnosis not present

## 2018-07-18 DIAGNOSIS — J449 Chronic obstructive pulmonary disease, unspecified: Secondary | ICD-10-CM | POA: Diagnosis not present

## 2018-07-18 DIAGNOSIS — I482 Chronic atrial fibrillation, unspecified: Secondary | ICD-10-CM | POA: Diagnosis not present

## 2018-07-18 DIAGNOSIS — R1900 Intra-abdominal and pelvic swelling, mass and lump, unspecified site: Secondary | ICD-10-CM | POA: Diagnosis not present

## 2018-07-18 DIAGNOSIS — N390 Urinary tract infection, site not specified: Secondary | ICD-10-CM | POA: Diagnosis not present

## 2018-07-18 DIAGNOSIS — K210 Gastro-esophageal reflux disease with esophagitis: Secondary | ICD-10-CM | POA: Diagnosis not present

## 2018-07-18 DIAGNOSIS — Z7401 Bed confinement status: Secondary | ICD-10-CM | POA: Diagnosis not present

## 2018-07-18 DIAGNOSIS — R1310 Dysphagia, unspecified: Secondary | ICD-10-CM | POA: Diagnosis present

## 2018-07-18 DIAGNOSIS — J189 Pneumonia, unspecified organism: Secondary | ICD-10-CM | POA: Diagnosis not present

## 2018-07-18 DIAGNOSIS — Z933 Colostomy status: Secondary | ICD-10-CM | POA: Diagnosis not present

## 2018-07-18 DIAGNOSIS — Z6833 Body mass index (BMI) 33.0-33.9, adult: Secondary | ICD-10-CM | POA: Diagnosis not present

## 2018-07-18 DIAGNOSIS — K5732 Diverticulitis of large intestine without perforation or abscess without bleeding: Secondary | ICD-10-CM | POA: Diagnosis not present

## 2018-07-18 DIAGNOSIS — Z96651 Presence of right artificial knee joint: Secondary | ICD-10-CM | POA: Diagnosis not present

## 2018-07-18 DIAGNOSIS — I5031 Acute diastolic (congestive) heart failure: Secondary | ICD-10-CM | POA: Diagnosis present

## 2018-07-18 DIAGNOSIS — I480 Paroxysmal atrial fibrillation: Secondary | ICD-10-CM | POA: Diagnosis not present

## 2018-07-18 DIAGNOSIS — E1165 Type 2 diabetes mellitus with hyperglycemia: Secondary | ICD-10-CM | POA: Diagnosis not present

## 2018-07-18 DIAGNOSIS — J9601 Acute respiratory failure with hypoxia: Secondary | ICD-10-CM | POA: Diagnosis not present

## 2018-07-18 DIAGNOSIS — I10 Essential (primary) hypertension: Secondary | ICD-10-CM | POA: Diagnosis not present

## 2018-07-18 DIAGNOSIS — L8990 Pressure ulcer of unspecified site, unspecified stage: Secondary | ICD-10-CM | POA: Diagnosis not present

## 2018-07-18 DIAGNOSIS — K2210 Ulcer of esophagus without bleeding: Secondary | ICD-10-CM | POA: Diagnosis not present

## 2018-07-18 DIAGNOSIS — K220 Achalasia of cardia: Secondary | ICD-10-CM | POA: Diagnosis present

## 2018-07-18 DIAGNOSIS — R531 Weakness: Secondary | ICD-10-CM | POA: Diagnosis not present

## 2018-07-18 DIAGNOSIS — I5033 Acute on chronic diastolic (congestive) heart failure: Secondary | ICD-10-CM | POA: Diagnosis not present

## 2018-07-18 DIAGNOSIS — B3781 Candidal esophagitis: Secondary | ICD-10-CM | POA: Diagnosis not present

## 2018-07-18 DIAGNOSIS — R05 Cough: Secondary | ICD-10-CM | POA: Diagnosis not present

## 2018-07-18 DIAGNOSIS — E440 Moderate protein-calorie malnutrition: Secondary | ICD-10-CM | POA: Diagnosis not present

## 2018-07-18 DIAGNOSIS — F064 Anxiety disorder due to known physiological condition: Secondary | ICD-10-CM | POA: Diagnosis present

## 2018-07-18 DIAGNOSIS — F0631 Mood disorder due to known physiological condition with depressive features: Secondary | ICD-10-CM | POA: Diagnosis present

## 2018-07-18 DIAGNOSIS — D649 Anemia, unspecified: Secondary | ICD-10-CM | POA: Diagnosis not present

## 2018-07-18 DIAGNOSIS — J9600 Acute respiratory failure, unspecified whether with hypoxia or hypercapnia: Secondary | ICD-10-CM | POA: Diagnosis not present

## 2018-07-18 DIAGNOSIS — R5381 Other malaise: Secondary | ICD-10-CM | POA: Diagnosis not present

## 2018-07-18 DIAGNOSIS — M6281 Muscle weakness (generalized): Secondary | ICD-10-CM | POA: Diagnosis not present

## 2018-07-18 DIAGNOSIS — J15212 Pneumonia due to Methicillin resistant Staphylococcus aureus: Secondary | ICD-10-CM | POA: Diagnosis not present

## 2018-07-18 DIAGNOSIS — Z743 Need for continuous supervision: Secondary | ICD-10-CM | POA: Diagnosis not present

## 2018-07-18 DIAGNOSIS — I4891 Unspecified atrial fibrillation: Secondary | ICD-10-CM | POA: Diagnosis not present

## 2018-07-18 DIAGNOSIS — M797 Fibromyalgia: Secondary | ICD-10-CM | POA: Diagnosis present

## 2018-07-18 DIAGNOSIS — Z9989 Dependence on other enabling machines and devices: Secondary | ICD-10-CM | POA: Diagnosis not present

## 2018-07-18 DIAGNOSIS — L89150 Pressure ulcer of sacral region, unstageable: Secondary | ICD-10-CM | POA: Diagnosis present

## 2018-07-18 DIAGNOSIS — M069 Rheumatoid arthritis, unspecified: Secondary | ICD-10-CM | POA: Diagnosis present

## 2018-07-18 DIAGNOSIS — F339 Major depressive disorder, recurrent, unspecified: Secondary | ICD-10-CM | POA: Diagnosis not present

## 2018-07-18 DIAGNOSIS — Z931 Gastrostomy status: Secondary | ICD-10-CM | POA: Diagnosis not present

## 2018-07-18 DIAGNOSIS — E46 Unspecified protein-calorie malnutrition: Secondary | ICD-10-CM | POA: Diagnosis present

## 2018-07-18 DIAGNOSIS — J181 Lobar pneumonia, unspecified organism: Secondary | ICD-10-CM | POA: Diagnosis not present

## 2018-07-18 DIAGNOSIS — F419 Anxiety disorder, unspecified: Secondary | ICD-10-CM | POA: Diagnosis not present

## 2018-07-18 DIAGNOSIS — E1100 Type 2 diabetes mellitus with hyperosmolarity without nonketotic hyperglycemic-hyperosmolar coma (NKHHC): Secondary | ICD-10-CM | POA: Diagnosis not present

## 2018-07-18 DIAGNOSIS — R112 Nausea with vomiting, unspecified: Secondary | ICD-10-CM | POA: Diagnosis not present

## 2018-07-18 DIAGNOSIS — M79606 Pain in leg, unspecified: Secondary | ICD-10-CM | POA: Diagnosis not present

## 2018-07-18 DIAGNOSIS — L89621 Pressure ulcer of left heel, stage 1: Secondary | ICD-10-CM | POA: Diagnosis present

## 2018-07-18 DIAGNOSIS — J9620 Acute and chronic respiratory failure, unspecified whether with hypoxia or hypercapnia: Secondary | ICD-10-CM | POA: Diagnosis not present

## 2018-07-18 DIAGNOSIS — R262 Difficulty in walking, not elsewhere classified: Secondary | ICD-10-CM | POA: Diagnosis not present

## 2018-07-18 DIAGNOSIS — I130 Hypertensive heart and chronic kidney disease with heart failure and stage 1 through stage 4 chronic kidney disease, or unspecified chronic kidney disease: Secondary | ICD-10-CM | POA: Diagnosis present

## 2018-07-18 DIAGNOSIS — E119 Type 2 diabetes mellitus without complications: Secondary | ICD-10-CM | POA: Diagnosis not present

## 2018-07-18 DIAGNOSIS — E1122 Type 2 diabetes mellitus with diabetic chronic kidney disease: Secondary | ICD-10-CM | POA: Diagnosis present

## 2018-07-18 DIAGNOSIS — A419 Sepsis, unspecified organism: Secondary | ICD-10-CM | POA: Diagnosis not present

## 2018-07-18 DIAGNOSIS — Z431 Encounter for attention to gastrostomy: Secondary | ICD-10-CM | POA: Diagnosis not present

## 2018-07-18 DIAGNOSIS — R1314 Dysphagia, pharyngoesophageal phase: Secondary | ICD-10-CM | POA: Diagnosis present

## 2018-07-18 DIAGNOSIS — Z9889 Other specified postprocedural states: Secondary | ICD-10-CM | POA: Diagnosis not present

## 2018-07-18 DIAGNOSIS — Z8673 Personal history of transient ischemic attack (TIA), and cerebral infarction without residual deficits: Secondary | ICD-10-CM | POA: Diagnosis not present

## 2018-07-18 DIAGNOSIS — M5440 Lumbago with sciatica, unspecified side: Secondary | ICD-10-CM | POA: Diagnosis not present

## 2018-07-18 DIAGNOSIS — R1311 Dysphagia, oral phase: Secondary | ICD-10-CM | POA: Diagnosis not present

## 2018-07-18 DIAGNOSIS — N179 Acute kidney failure, unspecified: Secondary | ICD-10-CM | POA: Diagnosis present

## 2018-07-18 DIAGNOSIS — I2510 Atherosclerotic heart disease of native coronary artery without angina pectoris: Secondary | ICD-10-CM | POA: Diagnosis present

## 2018-07-18 DIAGNOSIS — R109 Unspecified abdominal pain: Secondary | ICD-10-CM | POA: Diagnosis not present

## 2018-07-18 DIAGNOSIS — N189 Chronic kidney disease, unspecified: Secondary | ICD-10-CM | POA: Diagnosis present

## 2018-07-18 DIAGNOSIS — E785 Hyperlipidemia, unspecified: Secondary | ICD-10-CM | POA: Diagnosis not present

## 2018-07-18 DIAGNOSIS — M2550 Pain in unspecified joint: Secondary | ICD-10-CM | POA: Diagnosis not present

## 2018-07-18 DIAGNOSIS — R630 Anorexia: Secondary | ICD-10-CM | POA: Diagnosis not present

## 2018-07-18 DIAGNOSIS — R0602 Shortness of breath: Secondary | ICD-10-CM | POA: Diagnosis not present

## 2018-07-21 ENCOUNTER — Ambulatory Visit: Payer: Medicare Other | Admitting: Physician Assistant

## 2018-07-21 ENCOUNTER — Other Ambulatory Visit: Payer: Self-pay

## 2018-07-21 NOTE — Patient Outreach (Signed)
[facility] [facility]) Care Management  07/21/2018  [name] 03/15/1946 [id number]   Unsuccessful outreach attempt. Left HIPAA compliant voice message requesting a return call.  PLAN Will attempt outreach within 3-4 business days.  [place] [phone]

## 2018-07-24 ENCOUNTER — Other Ambulatory Visit: Payer: Self-pay

## 2018-07-24 ENCOUNTER — Ambulatory Visit: Payer: Self-pay

## 2018-07-24 NOTE — Patient Outreach (Signed)
[place] [facility] [facility]) Care Management  07/24/2018  Solash Tullo Bertsch June 26, 1945 [id number]    Successful outreach with member's spouse. Informed RNCM that Mrs. [name] was not discharged home as anticipated and transferred to [place].  PLAN Will update [facility] Post Acute Care Coordinator. Will complete case closure.  [place] [phone]

## 2018-08-12 DIAGNOSIS — R1310 Dysphagia, unspecified: Secondary | ICD-10-CM | POA: Diagnosis not present

## 2018-08-25 DIAGNOSIS — M79606 Pain in leg, unspecified: Secondary | ICD-10-CM | POA: Diagnosis not present

## 2018-08-25 DIAGNOSIS — R05 Cough: Secondary | ICD-10-CM | POA: Diagnosis not present

## 2018-08-25 DIAGNOSIS — I5040 Unspecified combined systolic (congestive) and diastolic (congestive) heart failure: Secondary | ICD-10-CM | POA: Diagnosis not present

## 2018-08-25 DIAGNOSIS — M069 Rheumatoid arthritis, unspecified: Secondary | ICD-10-CM | POA: Diagnosis not present

## 2018-08-25 DIAGNOSIS — F339 Major depressive disorder, recurrent, unspecified: Secondary | ICD-10-CM | POA: Diagnosis not present

## 2018-08-25 DIAGNOSIS — J449 Chronic obstructive pulmonary disease, unspecified: Secondary | ICD-10-CM | POA: Diagnosis not present

## 2018-08-25 DIAGNOSIS — I110 Hypertensive heart disease with heart failure: Secondary | ICD-10-CM | POA: Diagnosis not present

## 2018-08-25 DIAGNOSIS — H6120 Impacted cerumen, unspecified ear: Secondary | ICD-10-CM | POA: Diagnosis not present

## 2018-08-25 DIAGNOSIS — R531 Weakness: Secondary | ICD-10-CM | POA: Diagnosis not present

## 2018-08-25 DIAGNOSIS — E1100 Type 2 diabetes mellitus with hyperosmolarity without nonketotic hyperglycemic-hyperosmolar coma (NKHHC): Secondary | ICD-10-CM | POA: Diagnosis not present

## 2018-08-25 DIAGNOSIS — Z433 Encounter for attention to colostomy: Secondary | ICD-10-CM | POA: Diagnosis not present

## 2018-08-25 DIAGNOSIS — I482 Chronic atrial fibrillation, unspecified: Secondary | ICD-10-CM | POA: Diagnosis not present

## 2018-08-25 DIAGNOSIS — R630 Anorexia: Secondary | ICD-10-CM | POA: Diagnosis not present

## 2018-08-25 DIAGNOSIS — K5732 Diverticulitis of large intestine without perforation or abscess without bleeding: Secondary | ICD-10-CM | POA: Diagnosis not present

## 2018-08-25 DIAGNOSIS — R1310 Dysphagia, unspecified: Secondary | ICD-10-CM | POA: Diagnosis not present

## 2018-08-25 DIAGNOSIS — Z8673 Personal history of transient ischemic attack (TIA), and cerebral infarction without residual deficits: Secondary | ICD-10-CM | POA: Diagnosis not present

## 2018-08-25 DIAGNOSIS — J90 Pleural effusion, not elsewhere classified: Secondary | ICD-10-CM | POA: Diagnosis not present

## 2018-08-25 DIAGNOSIS — J9601 Acute respiratory failure with hypoxia: Secondary | ICD-10-CM | POA: Diagnosis not present

## 2018-08-25 DIAGNOSIS — Z48815 Encounter for surgical aftercare following surgery on the digestive system: Secondary | ICD-10-CM | POA: Diagnosis not present

## 2018-08-25 DIAGNOSIS — Z96651 Presence of right artificial knee joint: Secondary | ICD-10-CM | POA: Diagnosis not present

## 2018-08-25 DIAGNOSIS — I4811 Longstanding persistent atrial fibrillation: Secondary | ICD-10-CM | POA: Diagnosis not present

## 2018-08-25 DIAGNOSIS — M797 Fibromyalgia: Secondary | ICD-10-CM | POA: Diagnosis not present

## 2018-08-25 DIAGNOSIS — R609 Edema, unspecified: Secondary | ICD-10-CM | POA: Diagnosis not present

## 2018-08-25 DIAGNOSIS — K5730 Diverticulosis of large intestine without perforation or abscess without bleeding: Secondary | ICD-10-CM | POA: Diagnosis not present

## 2018-08-25 DIAGNOSIS — K220 Achalasia of cardia: Secondary | ICD-10-CM | POA: Diagnosis not present

## 2018-08-25 DIAGNOSIS — R509 Fever, unspecified: Secondary | ICD-10-CM | POA: Diagnosis not present

## 2018-08-25 DIAGNOSIS — J9600 Acute respiratory failure, unspecified whether with hypoxia or hypercapnia: Secondary | ICD-10-CM | POA: Diagnosis not present

## 2018-08-25 DIAGNOSIS — I4510 Unspecified right bundle-branch block: Secondary | ICD-10-CM | POA: Diagnosis not present

## 2018-08-25 DIAGNOSIS — Z7401 Bed confinement status: Secondary | ICD-10-CM | POA: Diagnosis not present

## 2018-08-25 DIAGNOSIS — E785 Hyperlipidemia, unspecified: Secondary | ICD-10-CM | POA: Diagnosis not present

## 2018-08-25 DIAGNOSIS — Z794 Long term (current) use of insulin: Secondary | ICD-10-CM | POA: Diagnosis not present

## 2018-08-25 DIAGNOSIS — R1900 Intra-abdominal and pelvic swelling, mass and lump, unspecified site: Secondary | ICD-10-CM | POA: Diagnosis not present

## 2018-08-25 DIAGNOSIS — R0902 Hypoxemia: Secondary | ICD-10-CM | POA: Diagnosis not present

## 2018-08-25 DIAGNOSIS — J189 Pneumonia, unspecified organism: Secondary | ICD-10-CM | POA: Diagnosis not present

## 2018-08-25 DIAGNOSIS — Z4800 Encounter for change or removal of nonsurgical wound dressing: Secondary | ICD-10-CM | POA: Diagnosis not present

## 2018-08-25 DIAGNOSIS — F329 Major depressive disorder, single episode, unspecified: Secondary | ICD-10-CM | POA: Diagnosis not present

## 2018-08-25 DIAGNOSIS — R1314 Dysphagia, pharyngoesophageal phase: Secondary | ICD-10-CM | POA: Diagnosis not present

## 2018-08-25 DIAGNOSIS — Z431 Encounter for attention to gastrostomy: Secondary | ICD-10-CM | POA: Diagnosis not present

## 2018-08-25 DIAGNOSIS — M7989 Other specified soft tissue disorders: Secondary | ICD-10-CM | POA: Diagnosis not present

## 2018-08-25 DIAGNOSIS — E440 Moderate protein-calorie malnutrition: Secondary | ICD-10-CM | POA: Diagnosis not present

## 2018-08-25 DIAGNOSIS — L89322 Pressure ulcer of left buttock, stage 2: Secondary | ICD-10-CM | POA: Diagnosis not present

## 2018-08-25 DIAGNOSIS — E119 Type 2 diabetes mellitus without complications: Secondary | ICD-10-CM | POA: Diagnosis not present

## 2018-08-25 DIAGNOSIS — R5381 Other malaise: Secondary | ICD-10-CM | POA: Diagnosis not present

## 2018-08-25 DIAGNOSIS — E46 Unspecified protein-calorie malnutrition: Secondary | ICD-10-CM | POA: Diagnosis not present

## 2018-08-25 DIAGNOSIS — I4891 Unspecified atrial fibrillation: Secondary | ICD-10-CM | POA: Diagnosis not present

## 2018-08-25 DIAGNOSIS — K5752 Diverticulitis of both small and large intestine without perforation or abscess without bleeding: Secondary | ICD-10-CM | POA: Diagnosis not present

## 2018-08-25 DIAGNOSIS — Z933 Colostomy status: Secondary | ICD-10-CM | POA: Diagnosis not present

## 2018-08-25 DIAGNOSIS — N179 Acute kidney failure, unspecified: Secondary | ICD-10-CM | POA: Diagnosis not present

## 2018-08-25 DIAGNOSIS — M2550 Pain in unspecified joint: Secondary | ICD-10-CM | POA: Diagnosis not present

## 2018-08-25 DIAGNOSIS — I2510 Atherosclerotic heart disease of native coronary artery without angina pectoris: Secondary | ICD-10-CM | POA: Diagnosis not present

## 2018-08-25 DIAGNOSIS — J181 Lobar pneumonia, unspecified organism: Secondary | ICD-10-CM | POA: Diagnosis not present

## 2018-08-25 DIAGNOSIS — I10 Essential (primary) hypertension: Secondary | ICD-10-CM | POA: Diagnosis not present

## 2018-08-25 DIAGNOSIS — R1909 Other intra-abdominal and pelvic swelling, mass and lump: Secondary | ICD-10-CM | POA: Diagnosis not present

## 2018-08-25 DIAGNOSIS — N182 Chronic kidney disease, stage 2 (mild): Secondary | ICD-10-CM | POA: Diagnosis not present

## 2018-08-25 DIAGNOSIS — K760 Fatty (change of) liver, not elsewhere classified: Secondary | ICD-10-CM | POA: Diagnosis not present

## 2018-08-25 DIAGNOSIS — R262 Difficulty in walking, not elsewhere classified: Secondary | ICD-10-CM | POA: Diagnosis not present

## 2018-08-25 DIAGNOSIS — K5792 Diverticulitis of intestine, part unspecified, without perforation or abscess without bleeding: Secondary | ICD-10-CM | POA: Diagnosis not present

## 2018-08-25 DIAGNOSIS — D72829 Elevated white blood cell count, unspecified: Secondary | ICD-10-CM | POA: Diagnosis not present

## 2018-08-25 DIAGNOSIS — M6281 Muscle weakness (generalized): Secondary | ICD-10-CM | POA: Diagnosis not present

## 2018-08-25 DIAGNOSIS — I509 Heart failure, unspecified: Secondary | ICD-10-CM | POA: Diagnosis not present

## 2018-08-25 DIAGNOSIS — R918 Other nonspecific abnormal finding of lung field: Secondary | ICD-10-CM | POA: Diagnosis not present

## 2018-08-25 DIAGNOSIS — E876 Hypokalemia: Secondary | ICD-10-CM | POA: Diagnosis not present

## 2018-08-25 DIAGNOSIS — N200 Calculus of kidney: Secondary | ICD-10-CM | POA: Diagnosis not present

## 2018-08-25 DIAGNOSIS — J9811 Atelectasis: Secondary | ICD-10-CM | POA: Diagnosis not present

## 2018-08-25 DIAGNOSIS — I5033 Acute on chronic diastolic (congestive) heart failure: Secondary | ICD-10-CM | POA: Diagnosis not present

## 2018-08-25 DIAGNOSIS — E1122 Type 2 diabetes mellitus with diabetic chronic kidney disease: Secondary | ICD-10-CM | POA: Diagnosis not present

## 2018-08-25 DIAGNOSIS — R600 Localized edema: Secondary | ICD-10-CM | POA: Diagnosis not present

## 2018-08-25 DIAGNOSIS — J9620 Acute and chronic respiratory failure, unspecified whether with hypoxia or hypercapnia: Secondary | ICD-10-CM | POA: Diagnosis not present

## 2018-08-25 DIAGNOSIS — J984 Other disorders of lung: Secondary | ICD-10-CM | POA: Diagnosis not present

## 2018-08-25 DIAGNOSIS — E871 Hypo-osmolality and hyponatremia: Secondary | ICD-10-CM | POA: Diagnosis not present

## 2018-08-25 DIAGNOSIS — Z931 Gastrostomy status: Secondary | ICD-10-CM | POA: Diagnosis not present

## 2018-08-25 DIAGNOSIS — M5440 Lumbago with sciatica, unspecified side: Secondary | ICD-10-CM | POA: Diagnosis not present

## 2018-08-27 DIAGNOSIS — F329 Major depressive disorder, single episode, unspecified: Secondary | ICD-10-CM | POA: Diagnosis not present

## 2018-08-27 DIAGNOSIS — E440 Moderate protein-calorie malnutrition: Secondary | ICD-10-CM | POA: Diagnosis not present

## 2018-08-27 DIAGNOSIS — K220 Achalasia of cardia: Secondary | ICD-10-CM | POA: Diagnosis not present

## 2018-08-27 DIAGNOSIS — I4811 Longstanding persistent atrial fibrillation: Secondary | ICD-10-CM | POA: Diagnosis not present

## 2018-09-03 DIAGNOSIS — D72829 Elevated white blood cell count, unspecified: Secondary | ICD-10-CM | POA: Diagnosis not present

## 2018-09-03 DIAGNOSIS — E440 Moderate protein-calorie malnutrition: Secondary | ICD-10-CM | POA: Diagnosis not present

## 2018-09-03 DIAGNOSIS — E871 Hypo-osmolality and hyponatremia: Secondary | ICD-10-CM | POA: Diagnosis not present

## 2018-09-03 DIAGNOSIS — R509 Fever, unspecified: Secondary | ICD-10-CM | POA: Diagnosis not present

## 2018-09-08 DIAGNOSIS — R609 Edema, unspecified: Secondary | ICD-10-CM | POA: Diagnosis not present

## 2018-09-08 DIAGNOSIS — K220 Achalasia of cardia: Secondary | ICD-10-CM | POA: Diagnosis not present

## 2018-09-08 DIAGNOSIS — M797 Fibromyalgia: Secondary | ICD-10-CM | POA: Diagnosis not present

## 2018-09-08 DIAGNOSIS — J189 Pneumonia, unspecified organism: Secondary | ICD-10-CM | POA: Diagnosis not present

## 2018-09-09 DIAGNOSIS — R600 Localized edema: Secondary | ICD-10-CM | POA: Diagnosis not present

## 2018-09-09 DIAGNOSIS — M7989 Other specified soft tissue disorders: Secondary | ICD-10-CM | POA: Diagnosis not present

## 2018-09-09 DIAGNOSIS — R1900 Intra-abdominal and pelvic swelling, mass and lump, unspecified site: Secondary | ICD-10-CM | POA: Diagnosis not present

## 2018-09-12 DIAGNOSIS — J90 Pleural effusion, not elsewhere classified: Secondary | ICD-10-CM | POA: Diagnosis not present

## 2018-09-12 DIAGNOSIS — K760 Fatty (change of) liver, not elsewhere classified: Secondary | ICD-10-CM | POA: Diagnosis not present

## 2018-09-12 DIAGNOSIS — N200 Calculus of kidney: Secondary | ICD-10-CM | POA: Diagnosis not present

## 2018-09-12 DIAGNOSIS — K5730 Diverticulosis of large intestine without perforation or abscess without bleeding: Secondary | ICD-10-CM | POA: Diagnosis not present

## 2018-09-12 DIAGNOSIS — R1909 Other intra-abdominal and pelvic swelling, mass and lump: Secondary | ICD-10-CM | POA: Diagnosis not present

## 2018-09-12 DIAGNOSIS — J9811 Atelectasis: Secondary | ICD-10-CM | POA: Diagnosis not present

## 2018-09-15 DIAGNOSIS — K220 Achalasia of cardia: Secondary | ICD-10-CM | POA: Diagnosis not present

## 2018-09-15 DIAGNOSIS — M7989 Other specified soft tissue disorders: Secondary | ICD-10-CM | POA: Diagnosis not present

## 2018-09-15 DIAGNOSIS — R1310 Dysphagia, unspecified: Secondary | ICD-10-CM | POA: Diagnosis not present

## 2018-09-15 DIAGNOSIS — E119 Type 2 diabetes mellitus without complications: Secondary | ICD-10-CM | POA: Diagnosis not present

## 2018-09-17 DIAGNOSIS — M7989 Other specified soft tissue disorders: Secondary | ICD-10-CM | POA: Diagnosis not present

## 2018-09-17 DIAGNOSIS — E119 Type 2 diabetes mellitus without complications: Secondary | ICD-10-CM | POA: Diagnosis not present

## 2018-09-17 DIAGNOSIS — K220 Achalasia of cardia: Secondary | ICD-10-CM | POA: Diagnosis not present

## 2018-09-17 DIAGNOSIS — R0902 Hypoxemia: Secondary | ICD-10-CM | POA: Diagnosis not present

## 2018-09-22 DIAGNOSIS — M797 Fibromyalgia: Secondary | ICD-10-CM | POA: Diagnosis not present

## 2018-09-22 DIAGNOSIS — R1900 Intra-abdominal and pelvic swelling, mass and lump, unspecified site: Secondary | ICD-10-CM | POA: Diagnosis not present

## 2018-09-22 DIAGNOSIS — E119 Type 2 diabetes mellitus without complications: Secondary | ICD-10-CM | POA: Diagnosis not present

## 2018-09-22 DIAGNOSIS — I2510 Atherosclerotic heart disease of native coronary artery without angina pectoris: Secondary | ICD-10-CM | POA: Diagnosis not present

## 2018-09-23 ENCOUNTER — Other Ambulatory Visit: Payer: Self-pay | Admitting: *Deleted

## 2018-09-23 NOTE — Patient Outreach (Signed)
[facility]) Care Management  09/23/2018  [name] 16-May-1946 [id number]   Collaboration with [facility] UM. [name] remains at [facility] in [place] receiving therapy.  [name] was active with [place] [facility] in the past. However, she was not fully engaged due to her hospitalizations and the like.   Discussed that writer will continue to follow [name]'s disposition plans and progression.   Will plan to re-refer back to [place] team closer to SNF discharge.  Will continue to collaborate with [facility] UM.   Marthenia Rolling, MSN-Ed, RN,BSN [place] Acute Care Coordinator [phone]

## 2018-09-24 DIAGNOSIS — E876 Hypokalemia: Secondary | ICD-10-CM | POA: Diagnosis not present

## 2018-09-24 DIAGNOSIS — I2510 Atherosclerotic heart disease of native coronary artery without angina pectoris: Secondary | ICD-10-CM | POA: Diagnosis not present

## 2018-09-24 DIAGNOSIS — R05 Cough: Secondary | ICD-10-CM | POA: Diagnosis not present

## 2018-09-24 DIAGNOSIS — H6120 Impacted cerumen, unspecified ear: Secondary | ICD-10-CM | POA: Diagnosis not present

## 2018-09-28 DIAGNOSIS — I5033 Acute on chronic diastolic (congestive) heart failure: Secondary | ICD-10-CM | POA: Diagnosis not present

## 2018-09-29 DIAGNOSIS — E876 Hypokalemia: Secondary | ICD-10-CM | POA: Diagnosis not present

## 2018-09-29 DIAGNOSIS — I2510 Atherosclerotic heart disease of native coronary artery without angina pectoris: Secondary | ICD-10-CM | POA: Diagnosis not present

## 2018-09-29 DIAGNOSIS — I5033 Acute on chronic diastolic (congestive) heart failure: Secondary | ICD-10-CM | POA: Diagnosis not present

## 2018-09-29 DIAGNOSIS — R609 Edema, unspecified: Secondary | ICD-10-CM | POA: Diagnosis not present

## 2018-10-03 ENCOUNTER — Other Ambulatory Visit: Payer: Self-pay | Admitting: *Deleted

## 2018-10-03 DIAGNOSIS — K5752 Diverticulitis of both small and large intestine without perforation or abscess without bleeding: Secondary | ICD-10-CM | POA: Diagnosis not present

## 2018-10-03 DIAGNOSIS — Z431 Encounter for attention to gastrostomy: Secondary | ICD-10-CM | POA: Diagnosis not present

## 2018-10-03 DIAGNOSIS — E1122 Type 2 diabetes mellitus with diabetic chronic kidney disease: Secondary | ICD-10-CM | POA: Diagnosis not present

## 2018-10-03 DIAGNOSIS — K220 Achalasia of cardia: Secondary | ICD-10-CM | POA: Diagnosis not present

## 2018-10-03 DIAGNOSIS — I10 Essential (primary) hypertension: Secondary | ICD-10-CM

## 2018-10-03 DIAGNOSIS — R1314 Dysphagia, pharyngoesophageal phase: Secondary | ICD-10-CM | POA: Diagnosis not present

## 2018-10-03 DIAGNOSIS — Z433 Encounter for attention to colostomy: Secondary | ICD-10-CM | POA: Diagnosis not present

## 2018-10-03 DIAGNOSIS — I5033 Acute on chronic diastolic (congestive) heart failure: Secondary | ICD-10-CM | POA: Diagnosis not present

## 2018-10-03 DIAGNOSIS — I4811 Longstanding persistent atrial fibrillation: Secondary | ICD-10-CM | POA: Diagnosis not present

## 2018-10-03 DIAGNOSIS — I110 Hypertensive heart disease with heart failure: Secondary | ICD-10-CM | POA: Diagnosis not present

## 2018-10-03 DIAGNOSIS — F329 Major depressive disorder, single episode, unspecified: Secondary | ICD-10-CM | POA: Diagnosis not present

## 2018-10-03 DIAGNOSIS — I2510 Atherosclerotic heart disease of native coronary artery without angina pectoris: Secondary | ICD-10-CM | POA: Diagnosis not present

## 2018-10-03 DIAGNOSIS — K5792 Diverticulitis of intestine, part unspecified, without perforation or abscess without bleeding: Secondary | ICD-10-CM | POA: Diagnosis not present

## 2018-10-03 DIAGNOSIS — Z48815 Encounter for surgical aftercare following surgery on the digestive system: Secondary | ICD-10-CM | POA: Diagnosis not present

## 2018-10-03 DIAGNOSIS — I482 Chronic atrial fibrillation, unspecified: Secondary | ICD-10-CM | POA: Diagnosis not present

## 2018-10-03 DIAGNOSIS — E46 Unspecified protein-calorie malnutrition: Secondary | ICD-10-CM | POA: Diagnosis not present

## 2018-10-03 NOTE — Patient Outreach (Signed)
[place] [facility]) Care Management  10/03/2018  Remedy Corporan Cleary [birth year] [id number]   Made aware by [facility] UM RN, per facility, Mrs. [name] was discharged home today per her request. She discharged home with husband and home health with [name].   Will make referral to [place] and [facility] LCSW for follow up due to member returning home. Husband and member were not open to staying long term care at facility. Member would have exhausted her SNF benefit of reaching 100 days around or about 10/11/2018. According to facility, Mr. Waltermire had begun the [facility] application process.   Appreciated of [facility] UM RN collaboration on this member.  Will make referral to [place] and [facility] LCSW.   Marthenia Rolling, MSN-Ed, RN,BSN [place] Acute Care Coordinator [phone]

## 2018-10-04 DIAGNOSIS — R1314 Dysphagia, pharyngoesophageal phase: Secondary | ICD-10-CM | POA: Diagnosis not present

## 2018-10-04 DIAGNOSIS — Z48815 Encounter for surgical aftercare following surgery on the digestive system: Secondary | ICD-10-CM | POA: Diagnosis not present

## 2018-10-04 DIAGNOSIS — Z431 Encounter for attention to gastrostomy: Secondary | ICD-10-CM | POA: Diagnosis not present

## 2018-10-04 DIAGNOSIS — K5752 Diverticulitis of both small and large intestine without perforation or abscess without bleeding: Secondary | ICD-10-CM | POA: Diagnosis not present

## 2018-10-04 DIAGNOSIS — E46 Unspecified protein-calorie malnutrition: Secondary | ICD-10-CM | POA: Diagnosis not present

## 2018-10-04 DIAGNOSIS — K220 Achalasia of cardia: Secondary | ICD-10-CM | POA: Diagnosis not present

## 2018-10-06 DIAGNOSIS — E46 Unspecified protein-calorie malnutrition: Secondary | ICD-10-CM | POA: Diagnosis not present

## 2018-10-06 DIAGNOSIS — K220 Achalasia of cardia: Secondary | ICD-10-CM | POA: Diagnosis not present

## 2018-10-06 DIAGNOSIS — Z48815 Encounter for surgical aftercare following surgery on the digestive system: Secondary | ICD-10-CM | POA: Diagnosis not present

## 2018-10-06 DIAGNOSIS — R1314 Dysphagia, pharyngoesophageal phase: Secondary | ICD-10-CM | POA: Diagnosis not present

## 2018-10-06 DIAGNOSIS — Z431 Encounter for attention to gastrostomy: Secondary | ICD-10-CM | POA: Diagnosis not present

## 2018-10-06 DIAGNOSIS — K5752 Diverticulitis of both small and large intestine without perforation or abscess without bleeding: Secondary | ICD-10-CM | POA: Diagnosis not present

## 2018-10-07 DIAGNOSIS — E46 Unspecified protein-calorie malnutrition: Secondary | ICD-10-CM | POA: Diagnosis not present

## 2018-10-07 DIAGNOSIS — K220 Achalasia of cardia: Secondary | ICD-10-CM | POA: Diagnosis not present

## 2018-10-07 DIAGNOSIS — R1314 Dysphagia, pharyngoesophageal phase: Secondary | ICD-10-CM | POA: Diagnosis not present

## 2018-10-07 DIAGNOSIS — K5752 Diverticulitis of both small and large intestine without perforation or abscess without bleeding: Secondary | ICD-10-CM | POA: Diagnosis not present

## 2018-10-07 DIAGNOSIS — Z48815 Encounter for surgical aftercare following surgery on the digestive system: Secondary | ICD-10-CM | POA: Diagnosis not present

## 2018-10-07 DIAGNOSIS — Z431 Encounter for attention to gastrostomy: Secondary | ICD-10-CM | POA: Diagnosis not present

## 2018-10-09 DIAGNOSIS — Z48815 Encounter for surgical aftercare following surgery on the digestive system: Secondary | ICD-10-CM | POA: Diagnosis not present

## 2018-10-09 DIAGNOSIS — Z431 Encounter for attention to gastrostomy: Secondary | ICD-10-CM | POA: Diagnosis not present

## 2018-10-09 DIAGNOSIS — R1314 Dysphagia, pharyngoesophageal phase: Secondary | ICD-10-CM | POA: Diagnosis not present

## 2018-10-09 DIAGNOSIS — K220 Achalasia of cardia: Secondary | ICD-10-CM | POA: Diagnosis not present

## 2018-10-09 DIAGNOSIS — K5752 Diverticulitis of both small and large intestine without perforation or abscess without bleeding: Secondary | ICD-10-CM | POA: Diagnosis not present

## 2018-10-09 DIAGNOSIS — E46 Unspecified protein-calorie malnutrition: Secondary | ICD-10-CM | POA: Diagnosis not present

## 2018-10-10 DIAGNOSIS — R1314 Dysphagia, pharyngoesophageal phase: Secondary | ICD-10-CM | POA: Diagnosis not present

## 2018-10-10 DIAGNOSIS — K5752 Diverticulitis of both small and large intestine without perforation or abscess without bleeding: Secondary | ICD-10-CM | POA: Diagnosis not present

## 2018-10-10 DIAGNOSIS — K220 Achalasia of cardia: Secondary | ICD-10-CM | POA: Diagnosis not present

## 2018-10-10 DIAGNOSIS — E46 Unspecified protein-calorie malnutrition: Secondary | ICD-10-CM | POA: Diagnosis not present

## 2018-10-10 DIAGNOSIS — Z48815 Encounter for surgical aftercare following surgery on the digestive system: Secondary | ICD-10-CM | POA: Diagnosis not present

## 2018-10-10 DIAGNOSIS — Z431 Encounter for attention to gastrostomy: Secondary | ICD-10-CM | POA: Diagnosis not present

## 2018-10-11 DIAGNOSIS — Z431 Encounter for attention to gastrostomy: Secondary | ICD-10-CM | POA: Diagnosis not present

## 2018-10-11 DIAGNOSIS — E46 Unspecified protein-calorie malnutrition: Secondary | ICD-10-CM | POA: Diagnosis not present

## 2018-10-11 DIAGNOSIS — K5752 Diverticulitis of both small and large intestine without perforation or abscess without bleeding: Secondary | ICD-10-CM | POA: Diagnosis not present

## 2018-10-11 DIAGNOSIS — Z48815 Encounter for surgical aftercare following surgery on the digestive system: Secondary | ICD-10-CM | POA: Diagnosis not present

## 2018-10-11 DIAGNOSIS — R1314 Dysphagia, pharyngoesophageal phase: Secondary | ICD-10-CM | POA: Diagnosis not present

## 2018-10-11 DIAGNOSIS — K220 Achalasia of cardia: Secondary | ICD-10-CM | POA: Diagnosis not present

## 2018-10-13 ENCOUNTER — Other Ambulatory Visit: Payer: Self-pay | Admitting: *Deleted

## 2018-10-13 ENCOUNTER — Other Ambulatory Visit (HOSPITAL_COMMUNITY)
Admission: AD | Admit: 2018-10-13 | Discharge: 2018-10-13 | Disposition: A | Discharge status: Home / Self Care | Payer: Medicare Other | Source: Skilled Nursing Facility | Attending: Internal Medicine | Admitting: Internal Medicine

## 2018-10-13 DIAGNOSIS — K5752 Diverticulitis of both small and large intestine without perforation or abscess without bleeding: Secondary | ICD-10-CM | POA: Insufficient documentation

## 2018-10-13 DIAGNOSIS — K220 Achalasia of cardia: Secondary | ICD-10-CM | POA: Diagnosis not present

## 2018-10-13 DIAGNOSIS — I482 Chronic atrial fibrillation, unspecified: Secondary | ICD-10-CM | POA: Diagnosis not present

## 2018-10-13 DIAGNOSIS — E1165 Type 2 diabetes mellitus with hyperglycemia: Secondary | ICD-10-CM | POA: Diagnosis not present

## 2018-10-13 DIAGNOSIS — R1314 Dysphagia, pharyngoesophageal phase: Secondary | ICD-10-CM | POA: Diagnosis not present

## 2018-10-13 DIAGNOSIS — I5032 Chronic diastolic (congestive) heart failure: Secondary | ICD-10-CM | POA: Diagnosis not present

## 2018-10-13 DIAGNOSIS — K5740 Diverticulitis of both small and large intestine with perforation and abscess without bleeding: Secondary | ICD-10-CM | POA: Diagnosis not present

## 2018-10-13 DIAGNOSIS — E1122 Type 2 diabetes mellitus with diabetic chronic kidney disease: Secondary | ICD-10-CM | POA: Insufficient documentation

## 2018-10-13 DIAGNOSIS — Z431 Encounter for attention to gastrostomy: Secondary | ICD-10-CM | POA: Diagnosis not present

## 2018-10-13 DIAGNOSIS — Z48815 Encounter for surgical aftercare following surgery on the digestive system: Secondary | ICD-10-CM | POA: Diagnosis not present

## 2018-10-13 DIAGNOSIS — E46 Unspecified protein-calorie malnutrition: Secondary | ICD-10-CM | POA: Diagnosis not present

## 2018-10-13 DIAGNOSIS — N182 Chronic kidney disease, stage 2 (mild): Secondary | ICD-10-CM | POA: Diagnosis not present

## 2018-10-13 LAB — COMPREHENSIVE METABOLIC PANEL
ALT: 60 U/L — ABNORMAL HIGH (ref 0–44)
AST: 75 U/L — ABNORMAL HIGH (ref 15–41)
Albumin: 2.2 g/dL — ABNORMAL LOW (ref 3.5–5.0)
Alkaline Phosphatase: 164 U/L — ABNORMAL HIGH (ref 38–126)
Anion gap: 14 (ref 5–15)
BUN: 50 mg/dL — ABNORMAL HIGH (ref 8–23)
CO2: 43 mmol/L — ABNORMAL HIGH (ref 22–32)
Calcium: 9.2 mg/dL (ref 8.9–10.3)
Chloride: 81 mmol/L — ABNORMAL LOW (ref 98–111)
Creatinine, Ser: 0.96 mg/dL (ref 0.44–1.00)
GFR calc Af Amer: >60 mL/min (ref >60)
GFR calc non Af Amer: 58 mL/min — ABNORMAL LOW (ref >60)
Glucose, Bld: 128 mg/dL — ABNORMAL HIGH (ref 70–99)
Potassium: 4.1 mmol/L (ref 3.5–5.1)
Sodium: 138 mmol/L (ref 135–145)
Total Bilirubin: 0.3 mg/dL (ref 0.3–1.2)
Total Protein: 5.3 g/dL — ABNORMAL LOW (ref 6.5–8.1)

## 2018-10-13 LAB — CBC WITH DIFFERENTIAL/PLATELET
Abs Immature Granulocytes: 0.04 10*3/uL (ref 0.00–0.07)
Basophils Absolute: 0.1 10*3/uL (ref 0.0–0.1)
Basophils Relative: 1 %
Eosinophils Absolute: 0.3 10*3/uL (ref 0.0–0.5)
Eosinophils Relative: 3 %
HCT: 35.5 % — ABNORMAL LOW (ref 36.0–46.0)
Hemoglobin: 11.1 g/dL — ABNORMAL LOW (ref 12.0–15.0)
Immature Granulocytes: 0 %
Lymphocytes Relative: 6 %
Lymphs Abs: 0.7 10*3/uL (ref 0.7–4.0)
MCH: 30.7 pg (ref 26.0–34.0)
MCHC: 31.3 g/dL (ref 30.0–36.0)
MCV: 98.3 fL (ref 80.0–100.0)
Monocytes Absolute: 0.8 10*3/uL (ref 0.1–1.0)
Monocytes Relative: 6 %
Neutro Abs: 9.9 10*3/uL — ABNORMAL HIGH (ref 1.7–7.7)
Neutrophils Relative %: 84 %
Platelets: 450 10*3/uL — ABNORMAL HIGH (ref 150–400)
RBC: 3.61 MIL/uL — ABNORMAL LOW (ref 3.87–5.11)
RDW: 16.9 % — ABNORMAL HIGH (ref 11.5–15.5)
WBC: 11.8 10*3/uL — ABNORMAL HIGH (ref 4.0–10.5)
nRBC: 0 % (ref 0.0–0.2)

## 2018-10-13 LAB — BRAIN NATRIURETIC PEPTIDE: B Natriuretic Peptide: 340 pg/mL — ABNORMAL HIGH (ref 0.0–100.0)

## 2018-10-13 LAB — HEMOGLOBIN A1C
Hgb A1c MFr Bld: 8.3 % — ABNORMAL HIGH (ref 4.8–5.6)
Mean Plasma Glucose: 191.51 mg/dL

## 2018-10-13 LAB — MAGNESIUM: Magnesium: 1.3 mg/dL — ABNORMAL LOW (ref 1.7–2.4)

## 2018-10-13 NOTE — Patient Outreach (Signed)
[facility]) Care Management  10/13/2018  [name] 10-05-1945 [id number]   CSW spoke with pt's husband by phone and confirmed pt's identity.  CSW explained reason for call and went into detail explaining [facility]'s role and services. Pt's husband states they have "advanced home care is handling it all" and CSW explained the difference and the free support services we can offer. Husband politely declines our services at this time; feels a bit overwhelmed with all the different providers and calls coming in.   CSW will close referral and advise [facility] team and PCP. Will send a brochure for review and follow up in case they decide they are interested in visits.     Eduard Clos, MSW, [place] Worker  [place] [phone]

## 2018-10-14 DIAGNOSIS — Z48815 Encounter for surgical aftercare following surgery on the digestive system: Secondary | ICD-10-CM | POA: Diagnosis not present

## 2018-10-14 DIAGNOSIS — K5752 Diverticulitis of both small and large intestine without perforation or abscess without bleeding: Secondary | ICD-10-CM | POA: Diagnosis not present

## 2018-10-14 DIAGNOSIS — R1314 Dysphagia, pharyngoesophageal phase: Secondary | ICD-10-CM | POA: Diagnosis not present

## 2018-10-14 DIAGNOSIS — K220 Achalasia of cardia: Secondary | ICD-10-CM | POA: Diagnosis not present

## 2018-10-14 DIAGNOSIS — E46 Unspecified protein-calorie malnutrition: Secondary | ICD-10-CM | POA: Diagnosis not present

## 2018-10-14 DIAGNOSIS — Z431 Encounter for attention to gastrostomy: Secondary | ICD-10-CM | POA: Diagnosis not present

## 2018-10-15 DIAGNOSIS — E46 Unspecified protein-calorie malnutrition: Secondary | ICD-10-CM | POA: Diagnosis not present

## 2018-10-15 DIAGNOSIS — Z431 Encounter for attention to gastrostomy: Secondary | ICD-10-CM | POA: Diagnosis not present

## 2018-10-15 DIAGNOSIS — K220 Achalasia of cardia: Secondary | ICD-10-CM | POA: Diagnosis not present

## 2018-10-15 DIAGNOSIS — K5752 Diverticulitis of both small and large intestine without perforation or abscess without bleeding: Secondary | ICD-10-CM | POA: Diagnosis not present

## 2018-10-15 DIAGNOSIS — R1314 Dysphagia, pharyngoesophageal phase: Secondary | ICD-10-CM | POA: Diagnosis not present

## 2018-10-15 DIAGNOSIS — Z48815 Encounter for surgical aftercare following surgery on the digestive system: Secondary | ICD-10-CM | POA: Diagnosis not present

## 2018-10-16 DIAGNOSIS — Z48815 Encounter for surgical aftercare following surgery on the digestive system: Secondary | ICD-10-CM | POA: Diagnosis not present

## 2018-10-16 DIAGNOSIS — I10 Essential (primary) hypertension: Secondary | ICD-10-CM | POA: Diagnosis not present

## 2018-10-16 DIAGNOSIS — Z431 Encounter for attention to gastrostomy: Secondary | ICD-10-CM | POA: Diagnosis not present

## 2018-10-16 DIAGNOSIS — E1122 Type 2 diabetes mellitus with diabetic chronic kidney disease: Secondary | ICD-10-CM | POA: Diagnosis not present

## 2018-10-16 DIAGNOSIS — R1314 Dysphagia, pharyngoesophageal phase: Secondary | ICD-10-CM | POA: Diagnosis not present

## 2018-10-16 DIAGNOSIS — K5752 Diverticulitis of both small and large intestine without perforation or abscess without bleeding: Secondary | ICD-10-CM | POA: Diagnosis not present

## 2018-10-16 DIAGNOSIS — K220 Achalasia of cardia: Secondary | ICD-10-CM | POA: Diagnosis not present

## 2018-10-16 DIAGNOSIS — I5032 Chronic diastolic (congestive) heart failure: Secondary | ICD-10-CM | POA: Diagnosis not present

## 2018-10-16 DIAGNOSIS — E46 Unspecified protein-calorie malnutrition: Secondary | ICD-10-CM | POA: Diagnosis not present

## 2018-10-17 DIAGNOSIS — R1314 Dysphagia, pharyngoesophageal phase: Secondary | ICD-10-CM | POA: Diagnosis not present

## 2018-10-17 DIAGNOSIS — K220 Achalasia of cardia: Secondary | ICD-10-CM | POA: Diagnosis not present

## 2018-10-17 DIAGNOSIS — I482 Chronic atrial fibrillation, unspecified: Secondary | ICD-10-CM | POA: Diagnosis not present

## 2018-10-17 DIAGNOSIS — K5752 Diverticulitis of both small and large intestine without perforation or abscess without bleeding: Secondary | ICD-10-CM | POA: Diagnosis not present

## 2018-10-17 DIAGNOSIS — Z431 Encounter for attention to gastrostomy: Secondary | ICD-10-CM | POA: Diagnosis not present

## 2018-10-17 DIAGNOSIS — E1122 Type 2 diabetes mellitus with diabetic chronic kidney disease: Secondary | ICD-10-CM | POA: Diagnosis not present

## 2018-10-17 DIAGNOSIS — K210 Gastro-esophageal reflux disease with esophagitis: Secondary | ICD-10-CM | POA: Diagnosis not present

## 2018-10-17 DIAGNOSIS — Z48815 Encounter for surgical aftercare following surgery on the digestive system: Secondary | ICD-10-CM | POA: Diagnosis not present

## 2018-10-17 DIAGNOSIS — Z736 Limitation of activities due to disability: Secondary | ICD-10-CM | POA: Diagnosis not present

## 2018-10-17 DIAGNOSIS — I5032 Chronic diastolic (congestive) heart failure: Secondary | ICD-10-CM | POA: Diagnosis not present

## 2018-10-17 DIAGNOSIS — N183 Chronic kidney disease, stage 3 (moderate): Secondary | ICD-10-CM | POA: Diagnosis not present

## 2018-10-17 DIAGNOSIS — Z7409 Other reduced mobility: Secondary | ICD-10-CM | POA: Diagnosis not present

## 2018-10-17 DIAGNOSIS — E1165 Type 2 diabetes mellitus with hyperglycemia: Secondary | ICD-10-CM | POA: Diagnosis not present

## 2018-10-17 DIAGNOSIS — K5740 Diverticulitis of both small and large intestine with perforation and abscess without bleeding: Secondary | ICD-10-CM | POA: Diagnosis not present

## 2018-10-17 DIAGNOSIS — E46 Unspecified protein-calorie malnutrition: Secondary | ICD-10-CM | POA: Diagnosis not present

## 2018-10-21 DIAGNOSIS — R1314 Dysphagia, pharyngoesophageal phase: Secondary | ICD-10-CM | POA: Diagnosis not present

## 2018-10-21 DIAGNOSIS — K220 Achalasia of cardia: Secondary | ICD-10-CM | POA: Diagnosis not present

## 2018-10-21 DIAGNOSIS — N183 Chronic kidney disease, stage 3 (moderate): Secondary | ICD-10-CM | POA: Diagnosis not present

## 2018-10-21 DIAGNOSIS — E1165 Type 2 diabetes mellitus with hyperglycemia: Secondary | ICD-10-CM | POA: Diagnosis not present

## 2018-10-21 DIAGNOSIS — E1122 Type 2 diabetes mellitus with diabetic chronic kidney disease: Secondary | ICD-10-CM | POA: Diagnosis not present

## 2018-10-21 DIAGNOSIS — K5740 Diverticulitis of both small and large intestine with perforation and abscess without bleeding: Secondary | ICD-10-CM | POA: Diagnosis not present

## 2018-10-21 DIAGNOSIS — I959 Hypotension, unspecified: Secondary | ICD-10-CM | POA: Diagnosis not present

## 2018-10-21 DIAGNOSIS — K5752 Diverticulitis of both small and large intestine without perforation or abscess without bleeding: Secondary | ICD-10-CM | POA: Diagnosis not present

## 2018-10-21 DIAGNOSIS — I482 Chronic atrial fibrillation, unspecified: Secondary | ICD-10-CM | POA: Diagnosis not present

## 2018-10-21 DIAGNOSIS — Z431 Encounter for attention to gastrostomy: Secondary | ICD-10-CM | POA: Diagnosis not present

## 2018-10-21 DIAGNOSIS — Z48815 Encounter for surgical aftercare following surgery on the digestive system: Secondary | ICD-10-CM | POA: Diagnosis not present

## 2018-10-21 DIAGNOSIS — E46 Unspecified protein-calorie malnutrition: Secondary | ICD-10-CM | POA: Diagnosis not present

## 2018-10-22 ENCOUNTER — Encounter: Payer: Self-pay | Admitting: Cardiology

## 2018-10-22 ENCOUNTER — Ambulatory Visit (INDEPENDENT_AMBULATORY_CARE_PROVIDER_SITE_OTHER): Payer: Medicare Other | Admitting: Cardiology

## 2018-10-22 ENCOUNTER — Other Ambulatory Visit: Payer: Self-pay

## 2018-10-22 VITALS — BP 113/78 | HR 72 | Ht 66.0 in | Wt 220.0 lb

## 2018-10-22 DIAGNOSIS — I2510 Atherosclerotic heart disease of native coronary artery without angina pectoris: Secondary | ICD-10-CM

## 2018-10-22 DIAGNOSIS — E46 Unspecified protein-calorie malnutrition: Secondary | ICD-10-CM | POA: Diagnosis not present

## 2018-10-22 DIAGNOSIS — R1314 Dysphagia, pharyngoesophageal phase: Secondary | ICD-10-CM | POA: Diagnosis not present

## 2018-10-22 DIAGNOSIS — I480 Paroxysmal atrial fibrillation: Secondary | ICD-10-CM | POA: Diagnosis not present

## 2018-10-22 DIAGNOSIS — I25810 Atherosclerosis of coronary artery bypass graft(s) without angina pectoris: Secondary | ICD-10-CM

## 2018-10-22 DIAGNOSIS — Z6841 Body Mass Index (BMI) 40.0 and over, adult: Secondary | ICD-10-CM

## 2018-10-22 DIAGNOSIS — Z8673 Personal history of transient ischemic attack (TIA), and cerebral infarction without residual deficits: Secondary | ICD-10-CM

## 2018-10-22 DIAGNOSIS — Z48815 Encounter for surgical aftercare following surgery on the digestive system: Secondary | ICD-10-CM | POA: Diagnosis not present

## 2018-10-22 DIAGNOSIS — K5752 Diverticulitis of both small and large intestine without perforation or abscess without bleeding: Secondary | ICD-10-CM | POA: Diagnosis not present

## 2018-10-22 DIAGNOSIS — K220 Achalasia of cardia: Secondary | ICD-10-CM | POA: Diagnosis not present

## 2018-10-22 DIAGNOSIS — Z431 Encounter for attention to gastrostomy: Secondary | ICD-10-CM | POA: Diagnosis not present

## 2018-10-22 DIAGNOSIS — Z951 Presence of aortocoronary bypass graft: Secondary | ICD-10-CM

## 2018-10-22 HISTORY — DX: Atherosclerotic heart disease of native coronary artery without angina pectoris: I25.10

## 2018-10-22 NOTE — Progress Notes (Signed)
.Virtual Visit via Video Note: This visit type was conducted due to national recommendations for restrictions regarding the COVID-19 Pandemic (e.g. social distancing).  This format is felt to be most appropriate for this patient at this time.  All issues noted in this document were discussed and addressed.  No physical exam was performed (except for noted visual exam findings with Telehealth visits).  The patient has consented to conduct a Telehealth visit and understands insurance will be billed.   I connected with@, on 10/22/18 at  by a video enabled telemedicine application and verified that I am speaking with the correct person using two identifiers.   I discussed the limitations of evaluation and management by telemedicine and the availability of in person appointments. The patient expressed understanding and agreed to proceed.   I have discussed with patient regarding the safety during COVID Pandemic and steps and precautions to be taken including social distancing, frequent hand wash and use of detergent soap, gels with the patient. I asked the patient to avoid touching mouth, nose, eyes, ears with the hands. I encouraged regular walking around the neighborhood and exercise and regular diet, as long as social distancing can be maintained.  Primary Physician/Referring:  [name], MD  Patient ID: [name], female    DOB: 06/11/1946, 73 y.o.   MRN: [id number]  Chief Complaint  Patient presents with  . Hospitalization Follow-up    Afib 07/2018  . Coronary Artery Disease  . Follow-up    44yr    HPI: [name]  is a 73 y.o. female  with coronary artery disease, had CABG in 1986 (records not available). She also has HTN, Hypercholesterolemia, DM type 2, Morbid Obesity, Rheumatoid arthritis, Fibromyalgia, OSA but unable to tolerate CPAP and prior h/o TIA at [facility] with dysarthria and confusional state in Jan 2015 and she had similar presentation in 2012 and evaluated  in [place], [place]. No further episodes of TIA. Neurologic work up did not reveal any stroke.   She was admitted with intra-abdominal abscess and underwent ileostomy, she was also found to have paroxysmal atrial fibrillation, now on chronic Eliquis therapy.  She was in the rehabilitation for almost 6 months, just returned home one week ago on 10/17/2018. She is presently on tube feeds and eating very little, had lost 90 Lbs but gained 30 Lbs back.   Past Medical History:  Diagnosis Date  . Abscess of sigmoid colon due to diverticulitis   . Anginal pain [facility])    sees Dr Einar Gip.   . Arthritis    rheumatoid ..   . CAD (coronary artery disease), native coronary artery 10/22/2018  . Diabetes mellitus without complication (HCC)    Metformin 2.3.2017  . Diverticulitis   . Diverticulitis of large intestine with perforation and abscess 04/05/2018  . Fibromyalgia   . GERD (gastroesophageal reflux disease)   . High cholesterol   . Liver disease 08- 2012   per Dr Dagmar Hait pt has enlarged liver  . Peripheral vascular disease (HCC)    legs  . Pneumonia 06/17/2016  . Sleep apnea    sleep study  oct 2012  . Stroke [facility]) 2011   [facility]      Past Surgical History:  Procedure Laterality Date  . [place]  . CHOLECYSTECTOMY  1996  . COLONOSCOPY N/A 02/13/2018   Procedure: COLONOSCOPY;  Surgeon: Rogene Houston, MD;  [facility];  Service: Endoscopy;  Laterality: N/A;  8:30  . COLOSTOMY    . CORONARY ARTERY BYPASS GRAFT  1986  . ESOPHAGEAL DILATION N/A 01/13/2018   Procedure: ESOPHAGEAL DILATION;  Surgeon: Rogene Houston, MD;  [facility];  Service: Endoscopy;  Laterality: N/A;  . ESOPHAGOGASTRODUODENOSCOPY N/A 01/13/2018   Procedure: ESOPHAGOGASTRODUODENOSCOPY (EGD);  Surgeon: Rogene Houston, MD;  [facility];  Service: Endoscopy;  Laterality: N/A;  . EYE SURGERY  2013   cat ext bilateral .  . EYE SURGERY  2002   laser  .  FOREIGN BODY REMOVAL  02/13/2018   Procedure: FOREIGN BODY REMOVAL;  Surgeon: Rogene Houston, MD;  [facility];  Service: Endoscopy;;  foreign body removal which appears to be food debri in a stem form removed from colon by DR. Rehman  . [place]- 2007   rod right leg - right wrist plate  . GAS/FLUID EXCHANGE  03/25/2012   Procedure: GAS/FLUID EXCHANGE;  Surgeon: Hayden Pedro, MD;  Location: [place];  Service: Ophthalmology;  Laterality: Right;  . GASTROSTOMY W/ FEEDING TUBE    . ileoseomy  04/2018  . IR SINUS/FIST TUBE CHK-NON GI  06/20/2018  . LAPAROTOMY N/A 04/30/2018   Procedure: EXPLORATORY LAPAROTOMY  LYSIS OF ADHESIONS SMALL BOWEL RESECTION WITH ANASTOMSIS LOOP ILEOSTOMY PLACEMENT OF DRAINS AND WOUND [place];  Surgeon: Donnie Mesa, MD;  Location: [place];  Service: General;  Laterality: N/A;  . PARS PLANA VITRECTOMY  03/25/2012   Procedure: PARS PLANA VITRECTOMY WITH 25 GAUGE;  Surgeon: Hayden Pedro, MD;  Location: [place];  Service: Ophthalmology;  Laterality: Right;    Social History   Socioeconomic History  . Marital status: Married    Spouse name: Herbie Baltimore  . Number of children: 2  . Years of education: 35  . Highest education level: Not on file  Occupational History  . Occupation: Retired  Scientific laboratory technician  . Financial resource strain: Not on file  . Food insecurity:    Worry: Not on file    Inability: Not on file  . Transportation needs:    Medical: Not on file    Non-medical: Not on file  Tobacco Use  . Smoking status: Former Smoker    Packs/day: 0.25    Years: 5.00    Pack years: 1.25    Types: Cigarettes    Last attempt to quit: 06/11/1984    Years since quitting: 34.3  . Smokeless tobacco: Never Used  Substance and Sexual Activity  . Alcohol use: Yes    Comment: occ  . Drug use: No    Types: Hydrocodone  . Sexual activity: Yes  Lifestyle  . Physical activity:    Days per week: Not on file    Minutes per session: Not on file  .  Stress: Not on file  Relationships  . Social connections:    Talks on phone: Not on file    Gets together: Not on file    Attends religious service: Not on file    Active member of club or organization: Not on file    Attends meetings of clubs or organizations: Not on file    Relationship status: Not on file  . Intimate partner violence:    Fear of current or ex partner: Not on file    Emotionally abused: Not on file    Physically abused: Not on file    Forced sexual activity: Not on file  Other Topics Concern  . Not on file  Social  History Narrative  . Not on file    Review of Systems  Constitution: Positive for malaise/fatigue. Negative for chills, decreased appetite and weight gain.  Cardiovascular: Positive for dyspnea on exertion and leg swelling. Negative for syncope.  Respiratory: Positive for snoring.   Endocrine: Negative for cold intolerance.  Hematologic/Lymphatic: Does not bruise/bleed easily.  Musculoskeletal: Positive for arthritis (RA) and back pain (Chronic). Negative for joint swelling.  Gastrointestinal: Positive for abdominal pain. Negative for anorexia and change in bowel habit.  Neurological: Negative for headaches and light-headedness.  Psychiatric/Behavioral: Negative for depression and substance abuse.  All other systems reviewed and are negative.     Objective  Blood pressure 113/78, pulse 72, height 5\' 6"  (1.676 m), weight 220 lb (99.8 kg). Body mass index is 35.51 kg/m.   Physical exam not performed or limited due to virtual visit.  Patient appeared to be in mild distress with increased RR and probably baseline, Neck was short, mild leg edema.   Please see exam details from prior visit is as below.  Physical Exam  Constitutional: She appears well-developed. No distress.  Morbidly obese  HENT:  Head: Atraumatic.  Eyes: Conjunctivae are normal.  Neck: Neck supple. No thyromegaly present.  Short neck and difficult to evaluate JVP   Cardiovascular: Normal rate, regular rhythm and normal heart sounds. Exam reveals decreased pulses. Exam reveals no gallop.  No murmur heard. Pulses:      Carotid pulses are 2+ on the right side and 2+ on the left side.      Dorsalis pedis pulses are 1+ on the right side and 1+ on the left side.       Posterior tibial pulses are 1+ on the right side and 1+ on the left side.  Femoral and popliteal pulse difficult to feel due to patient's body habitus.   Pulmonary/Chest: Effort normal and breath sounds normal.  Abdominal: Soft. Bowel sounds are normal.  Obese. Pannus present  Musculoskeletal: Normal range of motion.        General: No edema.  Neurological: She is alert.  Skin: Skin is warm and dry.  Psychiatric: She has a normal mood and affect.   Radiology: No results found.  Laboratory examination:    CMP Latest Ref Rng & Units 10/13/2018 06/17/2018 06/16/2018  Glucose 70 - 99 mg/dL 128(H) 120(H) 104(H)  BUN 8 - 23 mg/dL 50(H) 25(H) 29(H)  Creatinine 0.44 - 1.00 mg/dL 0.96 1.81(H) 1.75(H)  Sodium 135 - 145 mmol/L 138 139 139  Potassium 3.5 - 5.1 mmol/L 4.1 4.4 4.6  Chloride 98 - 111 mmol/L 81(L) 114(H) 117(H)  CO2 22 - 32 mmol/L 43(H) 17(L) 17(L)  Calcium 8.9 - 10.3 mg/dL 9.2 8.3(L) 8.5(L)  Total Protein 6.5 - 8.1 g/dL 5.3(L) - -  Total Bilirubin 0.3 - 1.2 mg/dL 0.3 - -  Alkaline Phos 38 - 126 U/L 164(H) - -  AST 15 - 41 U/L 75(H) - -  ALT 0 - 44 U/L 60(H) - -   CBC Latest Ref Rng & Units 10/13/2018 06/14/2018 06/13/2018  WBC 4.0 - 10.5 K/uL 11.8(H) 7.5 7.8  Hemoglobin 12.0 - 15.0 g/dL 11.1(L) 11.6(L) 11.2(L)  Hematocrit 36.0 - 46.0 % 35.5(L) 40.1 38.2  Platelets 150 - 400 K/uL 450(H) 330 338   Lipid Panel     Component Value Date/Time   CHOL 145 06/21/2013 0410   TRIG 185 (H) 05/05/2018 0351   HDL 45 06/21/2013 0410   CHOLHDL 3.2 06/21/2013 0410   VLDL 35 06/21/2013  0410   LDLCALC 65 06/21/2013 0410   HEMOGLOBIN A1C Lab Results  Component Value Date   HGBA1C 8.3 (H)  10/13/2018   MPG 191.51 10/13/2018   TSH Recent Labs    05/07/18 0348 06/15/18 0614  TSH 7.944* 8.390*    PRN Meds:. Medications Discontinued During This Encounter  Medication Reason  . warfarin (COUMADIN) 1 MG tablet Discontinued by provider  . temazepam (RESTORIL) 30 MG capsule Discontinued by provider  . pregabalin (LYRICA) 50 MG capsule Patient Preference  . predniSONE (DELTASONE) 5 MG tablet Completed Course  . pravastatin (PRAVACHOL) 40 MG tablet Discontinued by provider  . PARoxetine (PAXIL) 20 MG tablet Discontinued by provider  . isosorbide dinitrate (ISORDIL) 5 MG tablet Discontinued by provider  . ferrous sulfate 325 (65 FE) MG tablet Discontinued by provider  . clopidogrel (PLAVIX) 75 MG tablet Discontinued by provider  . Dexlansoprazole 30 MG capsule   . diclofenac sodium (VOLTAREN) 1 % GEL Discontinued by provider  . insulin aspart (NOVOLOG FLEXPEN) 100 UNIT/ML FlexPen Change in therapy   Current Meds  Medication Sig  . albuterol (ACCUNEB) 0.63 MG/3ML nebulizer solution as needed.  . diltiazem (CARDIZEM CD) 120 MG 24 hr capsule Take 1 capsule (120 mg total) by mouth daily. (Patient taking differently: Take 60 mg by mouth 3 (three) times daily. )  . ELIQUIS 5 MG TABS tablet 2 (two) times a day.  Marland Kitchen HUMULIN 70/30 (70-30) 100 UNIT/ML injection   . HYDROcodone-acetaminophen (NORCO) 10-325 MG tablet Take 1 tablet by mouth 4 times a day as needed  . Insulin Detemir (LEVEMIR FLEXPEN [place]) Inject 20 Units into the skin.  Marland Kitchen latanoprost (XALATAN) 0.005 % ophthalmic solution 1 drop at bedtime.  Marland Kitchen LORazepam (ATIVAN) 0.5 MG tablet every 8 (eight) hours.  . Magnesium 200 MG TABS Take 2 tablets by mouth 2 (two) times a day.   . metoprolol succinate (TOPROL-XL) 50 MG 24 hr tablet Take 1 tablet (50 mg total) by mouth daily. Take with or immediately following a meal.  . mirtazapine (REMERON) 30 MG tablet daily.  Marland Kitchen omeprazole (PRILOSEC) 20 MG capsule 2 (two) times a day.  . potassium  chloride SA (K-DUR,KLOR-CON) 20 MEQ tablet Take 15 mEq by mouth daily.   . promethazine (PHENERGAN) 25 MG tablet as needed.  . sertraline (ZOLOFT) 100 MG tablet daily.  Marland Kitchen SLOW-MAG 71.5-119 MG TBEC SR tablet 2 (two) times daily. Give 143 mg by mouth three times a day   . Tafluprost (ZIOPTAN) 0.0015 % SOLN Place 1 drop into both eyes at bedtime.   . timolol (BETIMOL) 0.5 % ophthalmic solution Place 1 drop into both eyes 2 (two) times daily.   Marland Kitchen torsemide (DEMADEX) 20 MG tablet daily.  . Vitamin D, Ergocalciferol, (DRISDOL) 1.25 MG (50000 UT) CAPS capsule Take 50,000 Units by mouth every Thursday.   . [DISCONTINUED] insulin aspart (NOVOLOG FLEXPEN) 100 UNIT/ML FlexPen Give per sliding scale 2 times a day, 140-199 - 2 units, 200-250 - 4 units, 251-299 - 6 units,  300-349 - 8 units,  350 or above 10 units. Dispense syringes and needles as needed, Ok to switch to PEN if approved. Substitute to any brand approved. DX DM2, Code E11.65    Cardiac Studies:    Lexiscan Myoview stress test 07/03/2013: 1. Resting EKG NSR, Poor R wave progression. Low voltage complexes. Pulmonary disease pattern. Stress EKG was non diagnostic for ischemia. No ST-T changes of ischemia noted with pharmacologic stress testing. 2. The perfusion study demonstrated normal isotope uptake  both at rest and stress. There was no evidence of ischemia or scar. Dynamic gated images reveal normal wall motion and endocardial thickening. Left ventricular ejection fraction was estimated to be 61% No significant change from [place]: 02/16/11: Non diagnostic EKG. Very small apical ischemia vs gut artifact. Normal EF. Low.  Echo 06/22/2013: - Left ventricle: The cavity size was normal. There was moderate concentric hypertrophy. Systolic function was vigorous. The estimated ejection fraction was in the range of 65% to 70%. Wall motion was normal; there were no regional wall motion abnormalities. Left ventricular diastolic function parameters  were normal. Mitral valve: Calcified annulus. Left atrium: The atrium was mildly to moderately dilated. Right ventricle: The cavity size was moderately dilated. Right atrium: The atrium was mildly dilated. Impression: EKG 07/17/2017: Normal sinus rhythm at rate of 73 bpm, normal axis, frequent PVCs in the form of trigeminy. Low-voltage complexes. No evidence of ischemia. No significant change from EKG 07/22/2015, frequent PVC new.   Assessment   CAD without angina pectoris S/P CABG at [place]: No records  Hx of CABG 1986: No records  History of TIA (transient ischemic attack ([facility]) 06/20/2013 and [place], [place] in 2012.  Paroxysmal atrial fibrillation (HCC) CHA2DS2-VASc Score is 5. Yearly risk of stroke 6.7%  Class 3 severe obesity due to excess calories with serious comorbidity and body mass index (BMI) of 40.0 to 44.9 in adult [facility])  EKG 08/14/2017: Normal sinus rhythm at rate of 94 bpm, normal axis.  No evidence of ischemia.  Low-voltage complexes.  Normal QT interval.  Recommendations:   Patient is here on annual visit and follow-up of coronary artery disease. On review of chart, she had been admitted with intra-abdominal abscess after her surgery for colectomy followed by colostomy For diverticulitis which was perforated.  She also developed intra-abdominal abscess.  Acute renal failure and paroxysmal atrial fibrillation, she was started on Eliquis.  She is just returning back home about 2 weeks ago, still looks fairly weak but has gained about 30 pounds in weight, had lost about 80 pounds.  I have extensively discussed with the patient regarding making lifestyle changes again and to avoid excessive to feeding, as patient is presently not taking much orally.  She'll discuss with the home health care.  She has had 2 episodes of TIA in the past and I suspect it was probably related to atrial fibrillation.  CAD remained stable without angina and in spite of complex surgical  procedures, she has not had any cardiac issues fortunately.  Her leg to see her in office back in 2 months for f/u. DM is still uncontrolled.    Adrian Prows, MD, [facility] 10/22/2018, 10:33 PM [facility]. [place] Pager: [phone] Office: [phone] If no answer Cell [phone]

## 2018-10-23 DIAGNOSIS — Z48815 Encounter for surgical aftercare following surgery on the digestive system: Secondary | ICD-10-CM | POA: Diagnosis not present

## 2018-10-23 DIAGNOSIS — K5752 Diverticulitis of both small and large intestine without perforation or abscess without bleeding: Secondary | ICD-10-CM | POA: Diagnosis not present

## 2018-10-23 DIAGNOSIS — Z431 Encounter for attention to gastrostomy: Secondary | ICD-10-CM | POA: Diagnosis not present

## 2018-10-23 DIAGNOSIS — E46 Unspecified protein-calorie malnutrition: Secondary | ICD-10-CM | POA: Diagnosis not present

## 2018-10-23 DIAGNOSIS — R1314 Dysphagia, pharyngoesophageal phase: Secondary | ICD-10-CM | POA: Diagnosis not present

## 2018-10-23 DIAGNOSIS — K220 Achalasia of cardia: Secondary | ICD-10-CM | POA: Diagnosis not present

## 2018-10-24 DIAGNOSIS — K5752 Diverticulitis of both small and large intestine without perforation or abscess without bleeding: Secondary | ICD-10-CM | POA: Diagnosis not present

## 2018-10-24 DIAGNOSIS — I959 Hypotension, unspecified: Secondary | ICD-10-CM | POA: Diagnosis not present

## 2018-10-24 DIAGNOSIS — Z48815 Encounter for surgical aftercare following surgery on the digestive system: Secondary | ICD-10-CM | POA: Diagnosis not present

## 2018-10-24 DIAGNOSIS — I482 Chronic atrial fibrillation, unspecified: Secondary | ICD-10-CM | POA: Diagnosis not present

## 2018-10-24 DIAGNOSIS — Z431 Encounter for attention to gastrostomy: Secondary | ICD-10-CM | POA: Diagnosis not present

## 2018-10-24 DIAGNOSIS — R1314 Dysphagia, pharyngoesophageal phase: Secondary | ICD-10-CM | POA: Diagnosis not present

## 2018-10-24 DIAGNOSIS — K220 Achalasia of cardia: Secondary | ICD-10-CM | POA: Diagnosis not present

## 2018-10-24 DIAGNOSIS — K5740 Diverticulitis of both small and large intestine with perforation and abscess without bleeding: Secondary | ICD-10-CM | POA: Diagnosis not present

## 2018-10-24 DIAGNOSIS — E46 Unspecified protein-calorie malnutrition: Secondary | ICD-10-CM | POA: Diagnosis not present

## 2018-10-27 DIAGNOSIS — G894 Chronic pain syndrome: Secondary | ICD-10-CM | POA: Diagnosis not present

## 2018-10-27 DIAGNOSIS — K220 Achalasia of cardia: Secondary | ICD-10-CM | POA: Diagnosis not present

## 2018-10-27 DIAGNOSIS — R1314 Dysphagia, pharyngoesophageal phase: Secondary | ICD-10-CM | POA: Diagnosis not present

## 2018-10-27 DIAGNOSIS — I480 Paroxysmal atrial fibrillation: Secondary | ICD-10-CM | POA: Diagnosis not present

## 2018-10-27 DIAGNOSIS — R1310 Dysphagia, unspecified: Secondary | ICD-10-CM | POA: Diagnosis not present

## 2018-10-27 DIAGNOSIS — Z48815 Encounter for surgical aftercare following surgery on the digestive system: Secondary | ICD-10-CM | POA: Diagnosis not present

## 2018-10-27 DIAGNOSIS — K5752 Diverticulitis of both small and large intestine without perforation or abscess without bleeding: Secondary | ICD-10-CM | POA: Diagnosis not present

## 2018-10-27 DIAGNOSIS — I10 Essential (primary) hypertension: Secondary | ICD-10-CM | POA: Diagnosis not present

## 2018-10-27 DIAGNOSIS — Z431 Encounter for attention to gastrostomy: Secondary | ICD-10-CM | POA: Diagnosis not present

## 2018-10-27 DIAGNOSIS — I959 Hypotension, unspecified: Secondary | ICD-10-CM | POA: Diagnosis not present

## 2018-10-27 DIAGNOSIS — R1084 Generalized abdominal pain: Secondary | ICD-10-CM | POA: Diagnosis not present

## 2018-10-27 DIAGNOSIS — R110 Nausea: Secondary | ICD-10-CM | POA: Diagnosis not present

## 2018-10-27 DIAGNOSIS — N3281 Overactive bladder: Secondary | ICD-10-CM | POA: Diagnosis not present

## 2018-10-27 DIAGNOSIS — E782 Mixed hyperlipidemia: Secondary | ICD-10-CM | POA: Diagnosis not present

## 2018-10-27 DIAGNOSIS — K210 Gastro-esophageal reflux disease with esophagitis: Secondary | ICD-10-CM | POA: Diagnosis not present

## 2018-10-27 DIAGNOSIS — E46 Unspecified protein-calorie malnutrition: Secondary | ICD-10-CM | POA: Diagnosis not present

## 2018-10-27 DIAGNOSIS — R1013 Epigastric pain: Secondary | ICD-10-CM | POA: Diagnosis not present

## 2018-10-27 DIAGNOSIS — E119 Type 2 diabetes mellitus without complications: Secondary | ICD-10-CM | POA: Diagnosis not present

## 2018-10-27 DIAGNOSIS — N390 Urinary tract infection, site not specified: Secondary | ICD-10-CM | POA: Diagnosis not present

## 2018-10-29 DIAGNOSIS — R1314 Dysphagia, pharyngoesophageal phase: Secondary | ICD-10-CM | POA: Diagnosis not present

## 2018-10-29 DIAGNOSIS — K220 Achalasia of cardia: Secondary | ICD-10-CM | POA: Diagnosis not present

## 2018-10-29 DIAGNOSIS — Z48815 Encounter for surgical aftercare following surgery on the digestive system: Secondary | ICD-10-CM | POA: Diagnosis not present

## 2018-10-29 DIAGNOSIS — Z431 Encounter for attention to gastrostomy: Secondary | ICD-10-CM | POA: Diagnosis not present

## 2018-10-29 DIAGNOSIS — K5752 Diverticulitis of both small and large intestine without perforation or abscess without bleeding: Secondary | ICD-10-CM | POA: Diagnosis not present

## 2018-10-29 DIAGNOSIS — E46 Unspecified protein-calorie malnutrition: Secondary | ICD-10-CM | POA: Diagnosis not present

## 2018-10-30 DIAGNOSIS — M0589 Other rheumatoid arthritis with rheumatoid factor of multiple sites: Secondary | ICD-10-CM | POA: Diagnosis not present

## 2018-10-30 DIAGNOSIS — M67441 Ganglion, right hand: Secondary | ICD-10-CM | POA: Diagnosis not present

## 2018-10-30 DIAGNOSIS — M25561 Pain in right knee: Secondary | ICD-10-CM | POA: Diagnosis not present

## 2018-10-30 DIAGNOSIS — M150 Primary generalized (osteo)arthritis: Secondary | ICD-10-CM | POA: Diagnosis not present

## 2018-10-30 DIAGNOSIS — K220 Achalasia of cardia: Secondary | ICD-10-CM | POA: Diagnosis not present

## 2018-10-30 DIAGNOSIS — Z6841 Body Mass Index (BMI) 40.0 and over, adult: Secondary | ICD-10-CM | POA: Diagnosis not present

## 2018-10-30 DIAGNOSIS — E46 Unspecified protein-calorie malnutrition: Secondary | ICD-10-CM | POA: Diagnosis not present

## 2018-10-30 DIAGNOSIS — N183 Chronic kidney disease, stage 3 (moderate): Secondary | ICD-10-CM | POA: Diagnosis not present

## 2018-10-30 DIAGNOSIS — M797 Fibromyalgia: Secondary | ICD-10-CM | POA: Diagnosis not present

## 2018-10-30 DIAGNOSIS — L989 Disorder of the skin and subcutaneous tissue, unspecified: Secondary | ICD-10-CM | POA: Diagnosis not present

## 2018-10-30 DIAGNOSIS — Z431 Encounter for attention to gastrostomy: Secondary | ICD-10-CM | POA: Diagnosis not present

## 2018-10-30 DIAGNOSIS — R1314 Dysphagia, pharyngoesophageal phase: Secondary | ICD-10-CM | POA: Diagnosis not present

## 2018-10-30 DIAGNOSIS — M79672 Pain in left foot: Secondary | ICD-10-CM | POA: Diagnosis not present

## 2018-10-30 DIAGNOSIS — Z48815 Encounter for surgical aftercare following surgery on the digestive system: Secondary | ICD-10-CM | POA: Diagnosis not present

## 2018-10-30 DIAGNOSIS — K5752 Diverticulitis of both small and large intestine without perforation or abscess without bleeding: Secondary | ICD-10-CM | POA: Diagnosis not present

## 2018-11-02 DIAGNOSIS — K5752 Diverticulitis of both small and large intestine without perforation or abscess without bleeding: Secondary | ICD-10-CM | POA: Diagnosis not present

## 2018-11-02 DIAGNOSIS — K220 Achalasia of cardia: Secondary | ICD-10-CM | POA: Diagnosis not present

## 2018-11-02 DIAGNOSIS — Z4800 Encounter for change or removal of nonsurgical wound dressing: Secondary | ICD-10-CM | POA: Diagnosis not present

## 2018-11-02 DIAGNOSIS — L89322 Pressure ulcer of left buttock, stage 2: Secondary | ICD-10-CM | POA: Diagnosis not present

## 2018-11-02 DIAGNOSIS — R1314 Dysphagia, pharyngoesophageal phase: Secondary | ICD-10-CM | POA: Diagnosis not present

## 2018-11-02 DIAGNOSIS — Z794 Long term (current) use of insulin: Secondary | ICD-10-CM | POA: Diagnosis not present

## 2018-11-02 DIAGNOSIS — I482 Chronic atrial fibrillation, unspecified: Secondary | ICD-10-CM | POA: Diagnosis not present

## 2018-11-02 DIAGNOSIS — Z433 Encounter for attention to colostomy: Secondary | ICD-10-CM | POA: Diagnosis not present

## 2018-11-02 DIAGNOSIS — I5033 Acute on chronic diastolic (congestive) heart failure: Secondary | ICD-10-CM | POA: Diagnosis not present

## 2018-11-02 DIAGNOSIS — I2510 Atherosclerotic heart disease of native coronary artery without angina pectoris: Secondary | ICD-10-CM | POA: Diagnosis not present

## 2018-11-02 DIAGNOSIS — N182 Chronic kidney disease, stage 2 (mild): Secondary | ICD-10-CM | POA: Diagnosis not present

## 2018-11-02 DIAGNOSIS — Z431 Encounter for attention to gastrostomy: Secondary | ICD-10-CM | POA: Diagnosis not present

## 2018-11-02 DIAGNOSIS — E46 Unspecified protein-calorie malnutrition: Secondary | ICD-10-CM | POA: Diagnosis not present

## 2018-11-02 DIAGNOSIS — E1122 Type 2 diabetes mellitus with diabetic chronic kidney disease: Secondary | ICD-10-CM | POA: Diagnosis not present

## 2018-11-02 DIAGNOSIS — Z48815 Encounter for surgical aftercare following surgery on the digestive system: Secondary | ICD-10-CM | POA: Diagnosis not present

## 2018-11-02 DIAGNOSIS — I110 Hypertensive heart disease with heart failure: Secondary | ICD-10-CM | POA: Diagnosis not present

## 2018-11-04 DIAGNOSIS — E46 Unspecified protein-calorie malnutrition: Secondary | ICD-10-CM | POA: Diagnosis not present

## 2018-11-04 DIAGNOSIS — Z431 Encounter for attention to gastrostomy: Secondary | ICD-10-CM | POA: Diagnosis not present

## 2018-11-04 DIAGNOSIS — K5752 Diverticulitis of both small and large intestine without perforation or abscess without bleeding: Secondary | ICD-10-CM | POA: Diagnosis not present

## 2018-11-04 DIAGNOSIS — Z48815 Encounter for surgical aftercare following surgery on the digestive system: Secondary | ICD-10-CM | POA: Diagnosis not present

## 2018-11-04 DIAGNOSIS — R1314 Dysphagia, pharyngoesophageal phase: Secondary | ICD-10-CM | POA: Diagnosis not present

## 2018-11-04 DIAGNOSIS — K220 Achalasia of cardia: Secondary | ICD-10-CM | POA: Diagnosis not present

## 2018-11-05 DIAGNOSIS — E46 Unspecified protein-calorie malnutrition: Secondary | ICD-10-CM | POA: Diagnosis not present

## 2018-11-05 DIAGNOSIS — K220 Achalasia of cardia: Secondary | ICD-10-CM | POA: Diagnosis not present

## 2018-11-05 DIAGNOSIS — R1314 Dysphagia, pharyngoesophageal phase: Secondary | ICD-10-CM | POA: Diagnosis not present

## 2018-11-05 DIAGNOSIS — K5752 Diverticulitis of both small and large intestine without perforation or abscess without bleeding: Secondary | ICD-10-CM | POA: Diagnosis not present

## 2018-11-05 DIAGNOSIS — Z48815 Encounter for surgical aftercare following surgery on the digestive system: Secondary | ICD-10-CM | POA: Diagnosis not present

## 2018-11-05 DIAGNOSIS — Z431 Encounter for attention to gastrostomy: Secondary | ICD-10-CM | POA: Diagnosis not present

## 2018-11-06 DIAGNOSIS — K220 Achalasia of cardia: Secondary | ICD-10-CM | POA: Diagnosis not present

## 2018-11-06 DIAGNOSIS — Z431 Encounter for attention to gastrostomy: Secondary | ICD-10-CM | POA: Diagnosis not present

## 2018-11-06 DIAGNOSIS — E46 Unspecified protein-calorie malnutrition: Secondary | ICD-10-CM | POA: Diagnosis not present

## 2018-11-06 DIAGNOSIS — R1314 Dysphagia, pharyngoesophageal phase: Secondary | ICD-10-CM | POA: Diagnosis not present

## 2018-11-06 DIAGNOSIS — K5752 Diverticulitis of both small and large intestine without perforation or abscess without bleeding: Secondary | ICD-10-CM | POA: Diagnosis not present

## 2018-11-06 DIAGNOSIS — Z48815 Encounter for surgical aftercare following surgery on the digestive system: Secondary | ICD-10-CM | POA: Diagnosis not present

## 2018-11-07 DIAGNOSIS — K219 Gastro-esophageal reflux disease without esophagitis: Secondary | ICD-10-CM | POA: Diagnosis not present

## 2018-11-07 DIAGNOSIS — K5740 Diverticulitis of both small and large intestine with perforation and abscess without bleeding: Secondary | ICD-10-CM | POA: Diagnosis not present

## 2018-11-07 DIAGNOSIS — Z431 Encounter for attention to gastrostomy: Secondary | ICD-10-CM | POA: Diagnosis not present

## 2018-11-07 DIAGNOSIS — E46 Unspecified protein-calorie malnutrition: Secondary | ICD-10-CM | POA: Diagnosis not present

## 2018-11-07 DIAGNOSIS — R140 Abdominal distension (gaseous): Secondary | ICD-10-CM | POA: Diagnosis not present

## 2018-11-07 DIAGNOSIS — Z48815 Encounter for surgical aftercare following surgery on the digestive system: Secondary | ICD-10-CM | POA: Diagnosis not present

## 2018-11-07 DIAGNOSIS — R627 Adult failure to thrive: Secondary | ICD-10-CM | POA: Diagnosis not present

## 2018-11-07 DIAGNOSIS — K5752 Diverticulitis of both small and large intestine without perforation or abscess without bleeding: Secondary | ICD-10-CM | POA: Diagnosis not present

## 2018-11-07 DIAGNOSIS — K220 Achalasia of cardia: Secondary | ICD-10-CM | POA: Diagnosis not present

## 2018-11-07 DIAGNOSIS — R1314 Dysphagia, pharyngoesophageal phase: Secondary | ICD-10-CM | POA: Diagnosis not present

## 2018-11-10 DIAGNOSIS — K5752 Diverticulitis of both small and large intestine without perforation or abscess without bleeding: Secondary | ICD-10-CM | POA: Diagnosis not present

## 2018-11-10 DIAGNOSIS — R110 Nausea: Secondary | ICD-10-CM | POA: Diagnosis not present

## 2018-11-10 DIAGNOSIS — R627 Adult failure to thrive: Secondary | ICD-10-CM | POA: Diagnosis not present

## 2018-11-10 DIAGNOSIS — R1084 Generalized abdominal pain: Secondary | ICD-10-CM | POA: Diagnosis not present

## 2018-11-10 DIAGNOSIS — R1314 Dysphagia, pharyngoesophageal phase: Secondary | ICD-10-CM | POA: Diagnosis not present

## 2018-11-10 DIAGNOSIS — E1165 Type 2 diabetes mellitus with hyperglycemia: Secondary | ICD-10-CM | POA: Diagnosis not present

## 2018-11-10 DIAGNOSIS — K219 Gastro-esophageal reflux disease without esophagitis: Secondary | ICD-10-CM | POA: Diagnosis not present

## 2018-11-10 DIAGNOSIS — K210 Gastro-esophageal reflux disease with esophagitis: Secondary | ICD-10-CM | POA: Diagnosis not present

## 2018-11-10 DIAGNOSIS — R1013 Epigastric pain: Secondary | ICD-10-CM | POA: Diagnosis not present

## 2018-11-10 DIAGNOSIS — R140 Abdominal distension (gaseous): Secondary | ICD-10-CM | POA: Diagnosis not present

## 2018-11-10 DIAGNOSIS — M545 Low back pain: Secondary | ICD-10-CM | POA: Diagnosis not present

## 2018-11-10 DIAGNOSIS — R350 Frequency of micturition: Secondary | ICD-10-CM | POA: Diagnosis not present

## 2018-11-10 DIAGNOSIS — E46 Unspecified protein-calorie malnutrition: Secondary | ICD-10-CM | POA: Diagnosis not present

## 2018-11-10 DIAGNOSIS — Z48815 Encounter for surgical aftercare following surgery on the digestive system: Secondary | ICD-10-CM | POA: Diagnosis not present

## 2018-11-10 DIAGNOSIS — R142 Eructation: Secondary | ICD-10-CM | POA: Diagnosis not present

## 2018-11-10 DIAGNOSIS — K220 Achalasia of cardia: Secondary | ICD-10-CM | POA: Diagnosis not present

## 2018-11-10 DIAGNOSIS — Z431 Encounter for attention to gastrostomy: Secondary | ICD-10-CM | POA: Diagnosis not present

## 2018-11-10 DIAGNOSIS — N390 Urinary tract infection, site not specified: Secondary | ICD-10-CM | POA: Diagnosis not present

## 2018-11-11 DIAGNOSIS — K5752 Diverticulitis of both small and large intestine without perforation or abscess without bleeding: Secondary | ICD-10-CM | POA: Diagnosis not present

## 2018-11-11 DIAGNOSIS — Z48815 Encounter for surgical aftercare following surgery on the digestive system: Secondary | ICD-10-CM | POA: Diagnosis not present

## 2018-11-11 DIAGNOSIS — Z431 Encounter for attention to gastrostomy: Secondary | ICD-10-CM | POA: Diagnosis not present

## 2018-11-11 DIAGNOSIS — R1314 Dysphagia, pharyngoesophageal phase: Secondary | ICD-10-CM | POA: Diagnosis not present

## 2018-11-11 DIAGNOSIS — E46 Unspecified protein-calorie malnutrition: Secondary | ICD-10-CM | POA: Diagnosis not present

## 2018-11-11 DIAGNOSIS — K220 Achalasia of cardia: Secondary | ICD-10-CM | POA: Diagnosis not present

## 2018-11-12 ENCOUNTER — Encounter (HOSPITAL_COMMUNITY): Payer: Self-pay | Admitting: Emergency Medicine

## 2018-11-12 ENCOUNTER — Other Ambulatory Visit: Payer: Self-pay

## 2018-11-12 ENCOUNTER — Inpatient Hospital Stay (HOSPITAL_COMMUNITY)
Admission: EM | Admit: 2018-11-12 | Discharge: 2018-12-10 | DRG: 291 | Disposition: E | Payer: Medicare Other | Attending: Internal Medicine | Admitting: Internal Medicine

## 2018-11-12 ENCOUNTER — Emergency Department (HOSPITAL_COMMUNITY): Payer: Medicare Other

## 2018-11-12 DIAGNOSIS — Z7189 Other specified counseling: Secondary | ICD-10-CM | POA: Diagnosis not present

## 2018-11-12 DIAGNOSIS — J90 Pleural effusion, not elsewhere classified: Secondary | ICD-10-CM

## 2018-11-12 DIAGNOSIS — I361 Nonrheumatic tricuspid (valve) insufficiency: Secondary | ICD-10-CM | POA: Diagnosis not present

## 2018-11-12 DIAGNOSIS — Z20828 Contact with and (suspected) exposure to other viral communicable diseases: Secondary | ICD-10-CM | POA: Diagnosis present

## 2018-11-12 DIAGNOSIS — Z888 Allergy status to other drugs, medicaments and biological substances status: Secondary | ICD-10-CM

## 2018-11-12 DIAGNOSIS — M797 Fibromyalgia: Secondary | ICD-10-CM | POA: Diagnosis present

## 2018-11-12 DIAGNOSIS — Z8261 Family history of arthritis: Secondary | ICD-10-CM

## 2018-11-12 DIAGNOSIS — G9389 Other specified disorders of brain: Secondary | ICD-10-CM | POA: Diagnosis not present

## 2018-11-12 DIAGNOSIS — I4891 Unspecified atrial fibrillation: Secondary | ICD-10-CM

## 2018-11-12 DIAGNOSIS — R069 Unspecified abnormalities of breathing: Secondary | ICD-10-CM | POA: Diagnosis not present

## 2018-11-12 DIAGNOSIS — J9621 Acute and chronic respiratory failure with hypoxia: Secondary | ICD-10-CM | POA: Diagnosis not present

## 2018-11-12 DIAGNOSIS — E876 Hypokalemia: Secondary | ICD-10-CM | POA: Diagnosis present

## 2018-11-12 DIAGNOSIS — E1122 Type 2 diabetes mellitus with diabetic chronic kidney disease: Secondary | ICD-10-CM | POA: Diagnosis present

## 2018-11-12 DIAGNOSIS — I130 Hypertensive heart and chronic kidney disease with heart failure and stage 1 through stage 4 chronic kidney disease, or unspecified chronic kidney disease: Secondary | ICD-10-CM | POA: Diagnosis not present

## 2018-11-12 DIAGNOSIS — Z8249 Family history of ischemic heart disease and other diseases of the circulatory system: Secondary | ICD-10-CM

## 2018-11-12 DIAGNOSIS — G4730 Sleep apnea, unspecified: Secondary | ICD-10-CM | POA: Diagnosis present

## 2018-11-12 DIAGNOSIS — Z87891 Personal history of nicotine dependence: Secondary | ICD-10-CM

## 2018-11-12 DIAGNOSIS — E785 Hyperlipidemia, unspecified: Secondary | ICD-10-CM | POA: Diagnosis present

## 2018-11-12 DIAGNOSIS — I469 Cardiac arrest, cause unspecified: Secondary | ICD-10-CM | POA: Diagnosis not present

## 2018-11-12 DIAGNOSIS — I959 Hypotension, unspecified: Secondary | ICD-10-CM | POA: Diagnosis not present

## 2018-11-12 DIAGNOSIS — Z515 Encounter for palliative care: Secondary | ICD-10-CM | POA: Diagnosis not present

## 2018-11-12 DIAGNOSIS — I25708 Atherosclerosis of coronary artery bypass graft(s), unspecified, with other forms of angina pectoris: Secondary | ICD-10-CM

## 2018-11-12 DIAGNOSIS — B9620 Unspecified Escherichia coli [E. coli] as the cause of diseases classified elsewhere: Secondary | ICD-10-CM | POA: Diagnosis present

## 2018-11-12 DIAGNOSIS — G894 Chronic pain syndrome: Secondary | ICD-10-CM | POA: Diagnosis present

## 2018-11-12 DIAGNOSIS — N183 Chronic kidney disease, stage 3 unspecified: Secondary | ICD-10-CM

## 2018-11-12 DIAGNOSIS — I2510 Atherosclerotic heart disease of native coronary artery without angina pectoris: Secondary | ICD-10-CM | POA: Diagnosis present

## 2018-11-12 DIAGNOSIS — I480 Paroxysmal atrial fibrillation: Secondary | ICD-10-CM | POA: Diagnosis present

## 2018-11-12 DIAGNOSIS — Z794 Long term (current) use of insulin: Secondary | ICD-10-CM | POA: Diagnosis not present

## 2018-11-12 DIAGNOSIS — E1165 Type 2 diabetes mellitus with hyperglycemia: Secondary | ICD-10-CM | POA: Diagnosis not present

## 2018-11-12 DIAGNOSIS — K219 Gastro-esophageal reflux disease without esophagitis: Secondary | ICD-10-CM | POA: Diagnosis present

## 2018-11-12 DIAGNOSIS — E7800 Pure hypercholesterolemia, unspecified: Secondary | ICD-10-CM | POA: Diagnosis present

## 2018-11-12 DIAGNOSIS — J811 Chronic pulmonary edema: Secondary | ICD-10-CM | POA: Diagnosis not present

## 2018-11-12 DIAGNOSIS — Z01818 Encounter for other preprocedural examination: Secondary | ICD-10-CM

## 2018-11-12 DIAGNOSIS — I5031 Acute diastolic (congestive) heart failure: Secondary | ICD-10-CM | POA: Diagnosis not present

## 2018-11-12 DIAGNOSIS — Z4682 Encounter for fitting and adjustment of non-vascular catheter: Secondary | ICD-10-CM | POA: Diagnosis not present

## 2018-11-12 DIAGNOSIS — R0602 Shortness of breath: Secondary | ICD-10-CM | POA: Diagnosis not present

## 2018-11-12 DIAGNOSIS — G8929 Other chronic pain: Secondary | ICD-10-CM | POA: Diagnosis present

## 2018-11-12 DIAGNOSIS — G92 Toxic encephalopathy: Secondary | ICD-10-CM | POA: Diagnosis not present

## 2018-11-12 DIAGNOSIS — Z66 Do not resuscitate: Secondary | ICD-10-CM | POA: Diagnosis not present

## 2018-11-12 DIAGNOSIS — Z1612 Extended spectrum beta lactamase (ESBL) resistance: Secondary | ICD-10-CM | POA: Diagnosis present

## 2018-11-12 DIAGNOSIS — T402X5A Adverse effect of other opioids, initial encounter: Secondary | ICD-10-CM | POA: Diagnosis not present

## 2018-11-12 DIAGNOSIS — L89322 Pressure ulcer of left buttock, stage 2: Secondary | ICD-10-CM | POA: Diagnosis present

## 2018-11-12 DIAGNOSIS — Z8673 Personal history of transient ischemic attack (TIA), and cerebral infarction without residual deficits: Secondary | ICD-10-CM

## 2018-11-12 DIAGNOSIS — L8990 Pressure ulcer of unspecified site, unspecified stage: Secondary | ICD-10-CM

## 2018-11-12 DIAGNOSIS — M069 Rheumatoid arthritis, unspecified: Secondary | ICD-10-CM | POA: Diagnosis present

## 2018-11-12 DIAGNOSIS — N390 Urinary tract infection, site not specified: Secondary | ICD-10-CM | POA: Diagnosis not present

## 2018-11-12 DIAGNOSIS — I5033 Acute on chronic diastolic (congestive) heart failure: Secondary | ICD-10-CM | POA: Diagnosis present

## 2018-11-12 DIAGNOSIS — R0902 Hypoxemia: Secondary | ICD-10-CM

## 2018-11-12 DIAGNOSIS — R1084 Generalized abdominal pain: Secondary | ICD-10-CM | POA: Diagnosis not present

## 2018-11-12 DIAGNOSIS — Z7901 Long term (current) use of anticoagulants: Secondary | ICD-10-CM | POA: Diagnosis not present

## 2018-11-12 DIAGNOSIS — Z933 Colostomy status: Secondary | ICD-10-CM

## 2018-11-12 DIAGNOSIS — R52 Pain, unspecified: Secondary | ICD-10-CM | POA: Diagnosis not present

## 2018-11-12 DIAGNOSIS — Z91011 Allergy to milk products: Secondary | ICD-10-CM

## 2018-11-12 DIAGNOSIS — Z951 Presence of aortocoronary bypass graft: Secondary | ICD-10-CM | POA: Diagnosis not present

## 2018-11-12 DIAGNOSIS — E1151 Type 2 diabetes mellitus with diabetic peripheral angiopathy without gangrene: Secondary | ICD-10-CM | POA: Diagnosis present

## 2018-11-12 DIAGNOSIS — Z9119 Patient's noncompliance with other medical treatment and regimen: Secondary | ICD-10-CM

## 2018-11-12 DIAGNOSIS — I4510 Unspecified right bundle-branch block: Secondary | ICD-10-CM | POA: Diagnosis present

## 2018-11-12 LAB — LACTIC ACID, PLASMA
Lactic Acid, Venous: 1.3 mmol/L (ref 0.5–1.9)
Lactic Acid, Venous: 1.4 mmol/L (ref 0.5–1.9)

## 2018-11-12 LAB — URINALYSIS, ROUTINE W REFLEX MICROSCOPIC
Bilirubin Urine: NEGATIVE
Glucose, UA: NEGATIVE mg/dL
Hgb urine dipstick: NEGATIVE
Ketones, ur: NEGATIVE mg/dL
Nitrite: NEGATIVE
Protein, ur: 30 mg/dL — AB
Specific Gravity, Urine: 1.014 (ref 1.005–1.030)
WBC, UA: >50 WBC/hpf — ABNORMAL HIGH (ref 0–5)
pH: 7 (ref 5.0–8.0)

## 2018-11-12 LAB — CBC WITH DIFFERENTIAL/PLATELET
Abs Immature Granulocytes: 0.04 10*3/uL (ref 0.00–0.07)
Basophils Absolute: 0.1 10*3/uL (ref 0.0–0.1)
Basophils Relative: 1 %
Eosinophils Absolute: 0.1 10*3/uL (ref 0.0–0.5)
Eosinophils Relative: 1 %
HCT: 40.6 % (ref 36.0–46.0)
Hemoglobin: 11.8 g/dL — ABNORMAL LOW (ref 12.0–15.0)
Immature Granulocytes: 0 %
Lymphocytes Relative: 7 %
Lymphs Abs: 0.9 10*3/uL (ref 0.7–4.0)
MCH: 29.4 pg (ref 26.0–34.0)
MCHC: 29.1 g/dL — ABNORMAL LOW (ref 30.0–36.0)
MCV: 101.2 fL — ABNORMAL HIGH (ref 80.0–100.0)
Monocytes Absolute: 1.2 10*3/uL — ABNORMAL HIGH (ref 0.1–1.0)
Monocytes Relative: 10 %
Neutro Abs: 10.6 10*3/uL — ABNORMAL HIGH (ref 1.7–7.7)
Neutrophils Relative %: 81 %
Platelets: 357 10*3/uL (ref 150–400)
RBC: 4.01 MIL/uL (ref 3.87–5.11)
RDW: 19.3 % — ABNORMAL HIGH (ref 11.5–15.5)
WBC: 12.9 10*3/uL — ABNORMAL HIGH (ref 4.0–10.5)
nRBC: 0 % (ref 0.0–0.2)

## 2018-11-12 LAB — MAGNESIUM: Magnesium: 1.4 mg/dL — ABNORMAL LOW (ref 1.7–2.4)

## 2018-11-12 LAB — COMPREHENSIVE METABOLIC PANEL
ALT: 37 U/L (ref 0–44)
AST: 51 U/L — ABNORMAL HIGH (ref 15–41)
Albumin: 2.8 g/dL — ABNORMAL LOW (ref 3.5–5.0)
Alkaline Phosphatase: 150 U/L — ABNORMAL HIGH (ref 38–126)
Anion gap: 18 — ABNORMAL HIGH (ref 5–15)
BUN: 49 mg/dL — ABNORMAL HIGH (ref 8–23)
CO2: 37 mmol/L — ABNORMAL HIGH (ref 22–32)
Calcium: 9.3 mg/dL (ref 8.9–10.3)
Chloride: 85 mmol/L — ABNORMAL LOW (ref 98–111)
Creatinine, Ser: 1.3 mg/dL — ABNORMAL HIGH (ref 0.44–1.00)
GFR calc Af Amer: 47 mL/min — ABNORMAL LOW (ref >60)
GFR calc non Af Amer: 40 mL/min — ABNORMAL LOW (ref >60)
Glucose, Bld: 130 mg/dL — ABNORMAL HIGH (ref 70–99)
Potassium: 4.5 mmol/L (ref 3.5–5.1)
Sodium: 140 mmol/L (ref 135–145)
Total Bilirubin: 0.5 mg/dL (ref 0.3–1.2)
Total Protein: 6.4 g/dL — ABNORMAL LOW (ref 6.5–8.1)

## 2018-11-12 LAB — BRAIN NATRIURETIC PEPTIDE: B Natriuretic Peptide: 244 pg/mL — ABNORMAL HIGH (ref 0.0–100.0)

## 2018-11-12 LAB — PROTIME-INR
INR: 1.3 — ABNORMAL HIGH (ref 0.8–1.2)
Prothrombin Time: 16.4 seconds — ABNORMAL HIGH (ref 11.4–15.2)

## 2018-11-12 LAB — MRSA PCR SCREENING: MRSA by PCR: NEGATIVE

## 2018-11-12 LAB — GLUCOSE, CAPILLARY
Glucose-Capillary: 110 mg/dL — ABNORMAL HIGH (ref 70–99)
Glucose-Capillary: 103 mg/dL — ABNORMAL HIGH (ref 70–99)
Glucose-Capillary: 114 mg/dL — ABNORMAL HIGH (ref 70–99)

## 2018-11-12 LAB — SARS CORONAVIRUS 2 BY RT PCR (HOSPITAL ORDER, PERFORMED IN ~~LOC~~ HOSPITAL LAB): SARS Coronavirus 2: NEGATIVE

## 2018-11-12 LAB — TROPONIN I: Troponin I: 0.03 ng/mL (ref <0.03)

## 2018-11-12 MED ORDER — FUROSEMIDE 10 MG/ML IJ SOLN
40.0000 mg | Freq: Two times a day (BID) | INTRAMUSCULAR | Status: DC
  Administered 2018-11-12 – 2018-11-14 (×4): 40 mg via INTRAVENOUS
  Filled 2018-11-12 (×4): qty 4

## 2018-11-12 MED ORDER — ACETAMINOPHEN 325 MG PO TABS: 650.0000 mg | ORAL_TABLET | ORAL | Status: DC | PRN

## 2018-11-12 MED ORDER — DILTIAZEM LOAD VIA INFUSION
10.0000 mg | Freq: Once | INTRAVENOUS | Status: AC
  Administered 2018-11-12: 10 mg via INTRAVENOUS
  Filled 2018-11-12: qty 10

## 2018-11-12 MED ORDER — METOPROLOL TARTRATE 50 MG PO TABS
50.0000 mg | ORAL_TABLET | Freq: Two times a day (BID) | ORAL | Status: DC
  Administered 2018-11-12 – 2018-11-14 (×5): 50 mg via ORAL
  Filled 2018-11-12 (×5): qty 1

## 2018-11-12 MED ORDER — POTASSIUM CHLORIDE 20 MEQ/15ML (10%) PO SOLN
20.0000 meq | Freq: Every day | ORAL | Status: DC
  Administered 2018-11-12: 20 meq
  Filled 2018-11-12: qty 30

## 2018-11-12 MED ORDER — SODIUM CHLORIDE 0.9 % IV SOLN
250.0000 mL | INTRAVENOUS | Status: DC | PRN
  Administered 2018-11-14 (×2): 250 mL via INTRAVENOUS

## 2018-11-12 MED ORDER — SODIUM CHLORIDE 0.9 % IV BOLUS
250.0000 mL | Freq: Once | INTRAVENOUS | Status: AC
  Administered 2018-11-12: 250 mL via INTRAVENOUS

## 2018-11-12 MED ORDER — OSMOLITE 1.5 CAL PO LIQD
1000.0000 mL | ORAL | Status: DC
  Administered 2018-11-12: 1000 mL
  Filled 2018-11-12 (×2): qty 1000

## 2018-11-12 MED ORDER — TIMOLOL MALEATE 0.5 % OP SOLN
1.0000 [drp] | Freq: Two times a day (BID) | OPHTHALMIC | Status: DC
  Administered 2018-11-12 – 2018-11-13 (×4): 1 [drp] via OPHTHALMIC
  Filled 2018-11-12 (×2): qty 5

## 2018-11-12 MED ORDER — MAGNESIUM OXIDE 400 (241.3 MG) MG PO TABS
ORAL_TABLET | Freq: Two times a day (BID) | ORAL | Status: DC
  Administered 2018-11-12 – 2018-11-14 (×5): 400 mg via ORAL
  Filled 2018-11-12 (×5): qty 1

## 2018-11-12 MED ORDER — PANTOPRAZOLE SODIUM 40 MG PO TBEC
40.0000 mg | DELAYED_RELEASE_TABLET | Freq: Every day | ORAL | Status: DC
  Administered 2018-11-12 – 2018-11-14 (×3): 40 mg via ORAL
  Filled 2018-11-12 (×3): qty 1

## 2018-11-12 MED ORDER — ONDANSETRON HCL 4 MG/2ML IJ SOLN
4.0000 mg | Freq: Four times a day (QID) | INTRAMUSCULAR | Status: DC | PRN
  Administered 2018-11-13: 4 mg via INTRAVENOUS
  Filled 2018-11-12: qty 2

## 2018-11-12 MED ORDER — MAGNESIUM SULFATE 2 GM/50ML IV SOLN
2.0000 g | Freq: Once | INTRAVENOUS | Status: AC
  Administered 2018-11-12: 2 g via INTRAVENOUS
  Filled 2018-11-12: qty 50

## 2018-11-12 MED ORDER — SODIUM CHLORIDE 0.9 % IV SOLN
INTRAVENOUS | Status: DC
  Administered 2018-11-12: 11:00:00 via INTRAVENOUS

## 2018-11-12 MED ORDER — SODIUM CHLORIDE 0.9% FLUSH
3.0000 mL | Freq: Two times a day (BID) | INTRAVENOUS | Status: DC
  Administered 2018-11-12 – 2018-11-14 (×5): 3 mL via INTRAVENOUS

## 2018-11-12 MED ORDER — SODIUM CHLORIDE 0.9% FLUSH: 3.0000 mL | INTRAVENOUS | Status: DC | PRN

## 2018-11-12 MED ORDER — SODIUM CHLORIDE 0.9 % IV SOLN
1.0000 g | INTRAVENOUS | Status: DC
  Administered 2018-11-12 – 2018-11-14 (×3): 1 g via INTRAVENOUS
  Filled 2018-11-12 (×3): qty 10

## 2018-11-12 MED ORDER — INSULIN GLARGINE 100 UNIT/ML ~~LOC~~ SOLN
10.0000 [IU] | Freq: Every day | SUBCUTANEOUS | Status: DC
  Administered 2018-11-12 – 2018-11-14 (×3): 10 [IU] via SUBCUTANEOUS
  Filled 2018-11-12 (×7): qty 0.1

## 2018-11-12 MED ORDER — DILTIAZEM HCL 100 MG IV SOLR
5.0000 mg/h | INTRAVENOUS | Status: DC
  Administered 2018-11-12: 7.5 mg/h via INTRAVENOUS
  Administered 2018-11-12: 5 mg/h via INTRAVENOUS
  Filled 2018-11-12 (×2): qty 100

## 2018-11-12 MED ORDER — INSULIN ASPART 100 UNIT/ML ~~LOC~~ SOLN: 0.0000 [IU] | Freq: Every day | SUBCUTANEOUS | Status: DC

## 2018-11-12 MED ORDER — POTASSIUM CHLORIDE CRYS ER 20 MEQ PO TBCR: 20.0000 meq | EXTENDED_RELEASE_TABLET | Freq: Every day | ORAL | Status: DC

## 2018-11-12 MED ORDER — INSULIN ASPART 100 UNIT/ML ~~LOC~~ SOLN
0.0000 [IU] | Freq: Three times a day (TID) | SUBCUTANEOUS | Status: DC
  Administered 2018-11-13: 2 [IU] via SUBCUTANEOUS
  Administered 2018-11-13: 3 [IU] via SUBCUTANEOUS
  Administered 2018-11-13 – 2018-11-14 (×2): 2 [IU] via SUBCUTANEOUS
  Administered 2018-11-14: 3 [IU] via SUBCUTANEOUS

## 2018-11-12 MED ORDER — APIXABAN 5 MG PO TABS
5.0000 mg | ORAL_TABLET | Freq: Two times a day (BID) | ORAL | Status: DC
  Administered 2018-11-12 – 2018-11-14 (×5): 5 mg via ORAL
  Filled 2018-11-12 (×5): qty 1

## 2018-11-12 MED ORDER — LATANOPROST 0.005 % OP SOLN
1.0000 [drp] | Freq: Every day | OPHTHALMIC | Status: DC
  Filled 2018-11-12: qty 2.5

## 2018-11-12 NOTE — ED Notes (Signed)
Date and time results received: 12/03/2018 0954 (use smartphrase ".now" to insert current time)  Test: Troponin Critical Value: 0.03  Name of Provider Notified: MD Thurnell Garbe  Orders Received? Or Actions Taken?: No new orders obtained.

## 2018-11-12 NOTE — H&P (Signed)
History and Physical  [name] CBJ:[id number] DOB: 1945-09-20 DOA: 11/26/2018   PCP: [name], MD   Patient coming from: Home  Chief Complaint: sob  HPI:  [name] is a 73 y.o. female with medical history of coronary artery disease, hyperlipidemia, stroke, hypertension, diabetes mellitus, rheumatoid arthritis, fibromyalgia/chronic pain presenting with3-week history of shortness of breath.  The patient has been having associated increasing lower extremity edema and orthopnea.  Unfortunately, the patient is a poor historian.  All of this history was obtained from review of the medical record and supplemented by the patient's daughter.  The patient was recently discharged from the nursing home in [place] approximately 6 weeks prior to this admission.  She was taking 60 mg of torsemide daily.  However in the past week, the patient had decrease it to 40 mg daily for unclear reasons.  The patient denies any chest pain, fever, chills, nausea, vomiting, diarrhea, abdominal pain, dysuria, hematuria.  The patient does not frequently weigh herself at home.  However, the patient was noted to have a cardiology visit at Dr. Einar Gip on 10/22/18 where she weighed 220 pounds.  According to the medical record, the patient has been poorly compliant with her diabetic diet as well as fluid restriction. The patient was recently admitted to the hospital from 04/05/2018 through 05/13/2018 due to perforated sigmoid diverticulitis with abscess. The patient hadan exploratory laparotomy with lysis of adhesions and small bowel resection with loop ileostomy on 04/30/2018 performed byDr. [name].  She was also admitted from 06/13/2018 through 06/17/2018 for new onset atrial fibrillation with RVR. In the emergency department, the patient was afebrile hemodynamically stable albeit with soft blood pressures.  She was noted to have atrial fibrillation with RVR with heart rate in the 140s.  The patient was started on diltiazem  drip.  She was started on IV furosemide.  Chest x-ray showed large left pleural effusion with bilateral interstitial prominence.  BMP showed a serum creatinine 1.30.  WBC was 12.9.  Urinalysis showed greater than 50 WBC.  Assessment/Plan: Acute diastolic CHF -Continue IV furosemide 40 mg IV twice daily -daily weights -accurate I/Os  Acute on chronic respiratory failure with hypoxia -normally on 2.5 L at home -currently stable on 4L -wean oxygen for sat >92%  Paroxysmal Atrial Fibrillation with RVR -continue apixaban -continue metoprolol as BP allows -continue diltiazem drip -consult cardiology as pt has soft BP and may need cardioversion/amio if no improvement  CKD stage 3 -baseline creatinine 1.0-1.3  Diabetes mellitus type 2, uncontrolled with hyperglycemia -10/13/2018 hemoglobin A1c 8.3 -Start reduced dose Lantus -NovoLog sliding scale -Holding glipizide  Pyuria -Start ceftriaxone pending urine culture data  Essential hypertension -Blood pressure is soft on diltiazem drip -Continue metoprolol succinate -Holding Isordil  Coronary artery disease -No chest pain presently -Continuemetoprolol succinate   Rheumatoid arthritis -Xeljanz on hold (on this x 3-4 yrs) -pt follows Dr. Amil Amen in outpatient setting  Hypomagnesemia -replete -continue po mag in addition to IV  Chronic pain syndrome/Fibromyalgia -continue home dose hydrocodone  History of stroke and TIA -Continue apixaban         Past Medical History:  Diagnosis Date  . Abscess of sigmoid colon due to diverticulitis   . Anginal pain [facility])    sees Dr Einar Gip.   . Arthritis    rheumatoid ..   . CAD (coronary artery disease), native coronary artery 10/22/2018  . Diabetes mellitus without complication (HCC)    Metformin 2.3.2017  . Diverticulitis   .  Diverticulitis of large intestine with perforation and abscess 04/05/2018  . Fibromyalgia   . GERD (gastroesophageal reflux disease)   . High  cholesterol   . Liver disease 08- 2012   per Dr Dagmar Hait pt has enlarged liver  . Peripheral vascular disease (HCC)    legs  . Pneumonia 06/17/2016  . Sleep apnea    sleep study  oct 2012  . Stroke [facility]) 2011   [facility]     Past Surgical History:  Procedure Laterality Date  . [place]  . CHOLECYSTECTOMY  1996  . COLONOSCOPY N/A 02/13/2018   Procedure: COLONOSCOPY;  Surgeon: Rogene Houston, MD;  [facility];  Service: Endoscopy;  Laterality: N/A;  8:30  . COLOSTOMY    . CORONARY ARTERY BYPASS GRAFT  1986  . ESOPHAGEAL DILATION N/A 01/13/2018   Procedure: ESOPHAGEAL DILATION;  Surgeon: Rogene Houston, MD;  [facility];  Service: Endoscopy;  Laterality: N/A;  . ESOPHAGOGASTRODUODENOSCOPY N/A 01/13/2018   Procedure: ESOPHAGOGASTRODUODENOSCOPY (EGD);  Surgeon: Rogene Houston, MD;  [facility];  Service: Endoscopy;  Laterality: N/A;  . EYE SURGERY  2013   cat ext bilateral .  . EYE SURGERY  2002   laser  . FOREIGN BODY REMOVAL  02/13/2018   Procedure: FOREIGN BODY REMOVAL;  Surgeon: Rogene Houston, MD;  [facility];  Service: Endoscopy;;  foreign body removal which appears to be food debri in a stem form removed from colon by DR. Rehman  . [place]- 2007   rod right leg - right wrist plate  . GAS/FLUID EXCHANGE  03/25/2012   Procedure: GAS/FLUID EXCHANGE;  Surgeon: Hayden Pedro, MD;  Location: [place];  Service: Ophthalmology;  Laterality: Right;  . GASTROSTOMY W/ FEEDING TUBE    . ileoseomy  04/2018  . IR SINUS/FIST TUBE CHK-NON GI  06/20/2018  . LAPAROTOMY N/A 04/30/2018   Procedure: EXPLORATORY LAPAROTOMY  LYSIS OF ADHESIONS SMALL BOWEL RESECTION WITH ANASTOMSIS LOOP ILEOSTOMY PLACEMENT OF DRAINS AND WOUND [place];  Surgeon: Donnie Mesa, MD;  Location: [place];  Service: General;  Laterality: N/A;  . PARS PLANA VITRECTOMY  03/25/2012   Procedure: PARS PLANA VITRECTOMY WITH 25 GAUGE;  Surgeon:  Hayden Pedro, MD;  Location: [place];  Service: Ophthalmology;  Laterality: Right;   Social History:  reports that she quit smoking about 34 years ago. Her smoking use included cigarettes. She has a 1.25 pack-year smoking history. She has never used smokeless tobacco. She reports current alcohol use. She reports that she does not use drugs.   Family History  Problem Relation Age of Onset  . Hypertension Daughter   . Rheum arthritis Maternal Grandmother      Allergies  Allergen Reactions  . Lactose Intolerance (Gi) Other (See Comments)    G.I. Upset  . Butrans [Buprenorphine] Rash and Other (See Comments)    Infected skin underneath application  . Simvastatin Rash     Prior to Admission medications   Medication Sig Start Date End Date Taking? Authorizing Provider  albuterol (ACCUNEB) 0.63 MG/3ML nebulizer solution Take 1 ampule by nebulization every 6 (six) hours as needed for wheezing or shortness of breath.  10/03/18  Yes [provider]  diltiazem (CARDIZEM CD) 120 MG 24 hr capsule Take 1 capsule (120 mg total) by mouth daily. Patient taking differently: Take 60 mg by mouth 3 (three) times daily.  05/13/18 05/13/19 Yes Thurnell Lose, MD  GLIPIZIDE XL 10 MG 24 hr tablet Take 1 tablet by mouth daily. 09/06/18  Yes [provider]  HUMULIN 70/30 (70-30) 100 UNIT/ML injection Inject 26 Units into the skin daily with breakfast.  10/03/18  Yes [provider]  LANTUS 100 UNIT/ML injection 20 Units at bedtime. 10/03/18  Yes [provider]  latanoprost (XALATAN) 0.005 % ophthalmic solution 1 drop at bedtime.   Yes [provider]  LORazepam (ATIVAN) 0.5 MG tablet every 8 (eight) hours. 10/03/18  Yes [provider]  Magnesium 200 MG TABS Take 2 tablets by mouth 2 (two) times a day.    Yes [provider]  metoprolol tartrate (LOPRESSOR) 50 MG tablet Take 1 tablet by mouth 2 (two) times a day. 10/03/18  Yes [provider]  Olmesartan-amLODIPine-HCTZ 40-5-25 MG TABS Take 1 tablet by mouth daily. 10/19/18  Yes [provider]  omeprazole (PRILOSEC) 20 MG capsule 2 (two) times a day. 10/03/18  Yes [provider]  potassium chloride SA (K-DUR,KLOR-CON) 20 MEQ tablet Take 15 mEq by mouth daily.    Yes [provider]  promethazine (PHENERGAN) 25 MG tablet Take 25 mg by mouth every 4 (four) hours as needed for nausea.  10/03/18  Yes [provider]  ELIQUIS 5 MG TABS tablet Take 5 mg by mouth 2 (two) times a day.  10/03/18   [provider]  HYDROcodone-acetaminophen (NORCO) 10-325 MG tablet Take 1 tablet by mouth 4 times a day as needed Patient not taking: Reported on 11/11/2018 06/13/18   Granville Lewis C, PA-C  Tafluprost (ZIOPTAN) 0.0015 % SOLN Place 1 drop into both eyes at bedtime.     [provider]  timolol (BETIMOL) 0.5 % ophthalmic solution Place 1 drop into both eyes 2 (two) times daily.     [provider]  Tofacitinib Citrate (XELJANZ) 5 MG TABS Take 1 tablet by mouth daily. On hold    [provider]  torsemide (DEMADEX) 20 MG tablet daily. 10/03/18   [provider]  Vitamin D, Ergocalciferol, (DRISDOL) 1.25 MG (50000 UT) CAPS capsule Take 50,000 Units by mouth every Thursday.     [provider]    Review of Systems:  Constitutional:  No weight loss, night sweats, Fevers, chills, fatigue.  Head&Eyes: No headache.  No vision loss.  No eye pain or scotoma ENT:  No Difficulty swallowing,Tooth/dental problems,Sore throat,  No ear ache, post nasal drip,  Cardio-vascular:  No chest painPND,  dizziness, palpitations  GI:  No  abdominal pain, nausea, vomiting, diarrhea, loss of appetite, hematochezia, melena, heartburn, indigestion, Resp:  No shortness of breath with exertion or at rest. No cough. No coughing up of blood .No wheezing.No chest wall deformity  Skin:  no rash or lesions.  GU:  no dysuria, change in color  of urine, no urgency or frequency. No flank pain.  Musculoskeletal:  No joint pain or swelling. No decreased range of motion. No back pain.  Psych:  No change in mood or affect. No depression or anxiety. Neurologic: No headache, no dysesthesia, no focal weakness, no vision loss. No syncope  Physical Exam: Vitals:   11/27/2018 0830 12/02/2018 0900 11/19/2018 0930 12/01/2018 0933  BP: 103/76 (!) 99/57 (!) 85/58 96/61  Pulse: (!) 124 (!) 113 (!) 116 (!) 116  Resp: 13 11 19 13   Temp:      TempSrc:      SpO2: 98% 97% 97% 98%  Weight:      Height:  General:  A&O x 3, NAD, nontoxic, pleasant/cooperative Head/Eye: No conjunctival hemorrhage, no icterus, /AT, No nystagmus ENT:  No icterus,  No thrush, good dentition, no pharyngeal exudate Neck:  No masses, no lymphadenpathy, no bruits CV:  RRR, no rub, no gallop, no S3 Lung:  Bibasilar crackles, no wheeze;  Diminished BS on right Abdomen: soft/NT, +BS, nondistended, no peritoneal signs Ext: No cyanosis, No rashes, No petechiae, No lymphangitis, 2 + LE edema Neuro: CNII-XII intact, strength 4/5 in bilateral upper and lower extremities, no dysmetria  Labs on Admission:  Basic Metabolic Panel: Recent Labs  Lab 12/06/2018 0908  NA 140  K 4.5  CL 85*  CO2 37*  GLUCOSE 130*  BUN 49*  CREATININE 1.30*  CALCIUM 9.3  MG 1.4*   Liver Function Tests: Recent Labs  Lab 11/27/2018 0908  AST 51*  ALT 37  ALKPHOS 150*  BILITOT 0.5  PROT 6.4*  ALBUMIN 2.8*   No results for input(s): LIPASE, AMYLASE in the last 168 hours. No results for input(s): AMMONIA in the last 168 hours. CBC: Recent Labs  Lab 12/04/2018 0908  WBC 12.9*  NEUTROABS 10.6*  HGB 11.8*  HCT 40.6  MCV 101.2*  PLT 357   Coagulation Profile: Recent Labs  Lab 11/26/2018 0908  INR 1.3*   Cardiac Enzymes: Recent Labs  Lab 11/16/2018 0908  TROPONINI 0.03*   BNP: Invalid input(s): POCBNP CBG: No results for input(s): GLUCAP in the last 168 hours. Urine  analysis:    Component Value Date/Time   COLORURINE YELLOW 11/27/2018 0721   APPEARANCEUR CLOUDY (A) 12/02/2018 0721   LABSPEC 1.014 11/26/2018 0721   PHURINE 7.0 11/28/2018 0721   GLUCOSEU NEGATIVE 11/26/2018 0721   HGBUR NEGATIVE 12/02/2018 0721   BILIRUBINUR NEGATIVE 11/14/2018 0721   KETONESUR NEGATIVE 12/05/2018 0721   PROTEINUR 30 (A) 12/03/2018 0721   UROBILINOGEN 0.2 06/20/2013 1832   NITRITE NEGATIVE 12/01/2018 0721   LEUKOCYTESUR LARGE (A) 11/20/2018 0721   Sepsis Labs: @LABRCNTIP (procalcitonin:4,lacticidven:4) ) Recent Results (from the past 240 hour(s))  SARS Coronavirus 2 (CEPHEID - Performed in [facility] hospital lab), Hosp Order     Status: None   Collection Time: 11/30/2018  7:21 AM  Result Value Ref Range Status   SARS Coronavirus 2 NEGATIVE NEGATIVE Final    Comment: (NOTE) If result is NEGATIVE SARS-CoV-2 target nucleic acids are NOT DETECTED. The SARS-CoV-2 RNA is generally detectable in upper and lower  respiratory specimens during the acute phase of infection. The lowest  concentration of SARS-CoV-2 viral copies this assay can detect is 250  copies / mL. A negative result does not preclude SARS-CoV-2 infection  and should not be used as the sole basis for treatment or other  patient management decisions.  A negative result may occur with  improper specimen collection / handling, submission of specimen other  than nasopharyngeal swab, presence of viral mutation(s) within the  areas targeted by this assay, and inadequate number of viral copies  (<250 copies / mL). A negative result must be combined with clinical  observations, patient history, and epidemiological information. If result is POSITIVE SARS-CoV-2 target nucleic acids are DETECTED. The SARS-CoV-2 RNA is generally detectable in upper and lower  respiratory specimens dur ing the acute phase of infection.  Positive  results are indicative of active infection with SARS-CoV-2.  Clinical   correlation with patient history and other diagnostic information is  necessary to determine patient infection status.  Positive results do  not rule out bacterial infection or  co-infection with other viruses. If result is PRESUMPTIVE POSTIVE SARS-CoV-2 nucleic acids MAY BE PRESENT.   A presumptive positive result was obtained on the submitted specimen  and confirmed on repeat testing.  While 2019 novel coronavirus  (SARS-CoV-2) nucleic acids may be present in the submitted sample  additional confirmatory testing may be necessary for epidemiological  and / or clinical management purposes  to differentiate between  SARS-CoV-2 and other Sarbecovirus currently known to infect humans.  If clinically indicated additional testing with an alternate test  methodology [phone]) is advised. The SARS-CoV-2 RNA is generally  detectable in upper and lower respiratory sp ecimens during the acute  phase of infection. The expected result is Negative. Fact Sheet for Patients:  StrictlyIdeas.no Fact Sheet for Healthcare Providers: BankingDealers.co.za This test is not yet approved or cleared by the [place] FDA and has been authorized for detection and/or diagnosis of SARS-CoV-2 by FDA under an Emergency Use Authorization (EUA).  This EUA will remain in effect (meaning this test can be used) for the duration of the COVID-19 declaration under Section 564(b)(1) of the Act, 21 U.S.C. section 360bbb-3(b)(1), unless the authorization is terminated or revoked sooner. Performed at [facility], [address] [place],  [address]      Radiological Exams on Admission: Dg Chest [facility] 1 View  Result Date: 12/04/2018 CLINICAL DATA:  Shortness of breath 3 days EXAM: PORTABLE CHEST 1 VIEW COMPARISON:  None. FINDINGS: Large right pleural effusion. Bilateral interstitial prominence. Trace left pleural effusion. No pneumothorax. Stable cardiomediastinal  silhouette. Prior median sternotomy. No acute osseous abnormality. IMPRESSION: 1. Large right pleural effusion.  Trace left pleural effusion. Electronically Signed   By: Kathreen Devoid   On: 12/05/2018 08:18    EKG: Independently reviewed. afib RVR, nonspecific T wave changes    Time spent:60 minutes Code Status:   FULL Family Communication:  No Family at bedside Disposition Plan: expect 2-3 day hospitalization Consults called: cardiology DVT Prophylaxis: apixaban  Orson Eva, DO  [facility] Pager [phone]  If 7PM-7AM, please contact night-coverage [email or web address] Password [facility] 11/20/2018, 10:47 AM

## 2018-11-12 NOTE — ED Provider Notes (Signed)
[facility] EMERGENCY DEPARTMENT Provider Note   CSN: [id number] Arrival date & time: 11/11/2018  0706    History   Chief Complaint Chief Complaint  Patient presents with   Shortness of Breath    HPI [name] is a 73 y.o. female.     HPI  Pt was seen at 58. Per EMS, pt's husband's report and pt: Pt c/o gradual onset and worsening of persistent SOB for the past 3 weeks, worse since last night. Has been associated with increasing LE's edema. Pt states she has had Tele visits by her PMD x2 where they "increased then decreased my water pill." Pt does not weigh herself. Denies CP/palpitations, no cough, no fevers, no known COVID+ exposures. Denies abd pain, no back pain, no N/V/D, no rash.    Past Medical History:  Diagnosis Date   Abscess of sigmoid colon due to diverticulitis    Anginal pain [facility])    sees Dr Einar Gip.    Arthritis    rheumatoid .Marland Kitchen    CAD (coronary artery disease), native coronary artery 10/22/2018   Diabetes mellitus without complication (HCC)    Metformin 2.3.2017   Diverticulitis    Diverticulitis of large intestine with perforation and abscess 04/05/2018   Fibromyalgia    GERD (gastroesophageal reflux disease)    High cholesterol    Liver disease 08- 2012   per Dr Dagmar Hait pt has enlarged liver   Peripheral vascular disease ([place])    legs   Pneumonia 06/17/2016   Sleep apnea    sleep study  oct 2012   Stroke [facility]) 2011   [facility]       Patient Active Problem List   Diagnosis Date Noted   CAD (coronary artery disease), native coronary artery 10/22/2018   Paroxysmal atrial fibrillation ([place]) 10/22/2018   Atrial fibrillation with RVR ([place]) 06/15/2018   Generalized weakness 06/15/2018   Weakness 06/14/2018   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) 06/13/2018   Muscular deconditioning 06/13/2018   Hypomagnesemia 06/13/2018   Fibromyalgia 04/06/2018   Anemia 04/05/2018   Esophageal dysphagia 01/03/2018   Rheumatoid arthritis  ([place]) 06/27/2016   Carpal tunnel syndrome 03/24/2014   Paresthesias/numbness 03/24/2014   Obesity, unspecified 09/23/2013   Pulmonary nodules 07/03/2013   TIA (transient ischemic attack) 06/20/2013   Left arm weakness 06/20/2013   DM (diabetes mellitus) ([place]) 06/20/2013   HTN (hypertension) 06/20/2013   Chronic pain 06/20/2013   Vitreous hemorrhage ([place]) 03/25/2012    Past Surgical History:  Procedure Laterality Date   [place]   COLONOSCOPY N/A 02/13/2018   Procedure: COLONOSCOPY;  Surgeon: Rogene Houston, MD;  Location: [facility];  Service: Endoscopy;  Laterality: N/A;  8:30   COLOSTOMY     CORONARY ARTERY BYPASS GRAFT  1986   ESOPHAGEAL DILATION N/A 01/13/2018   Procedure: ESOPHAGEAL DILATION;  Surgeon: Rogene Houston, MD;  Location: [facility];  Service: Endoscopy;  Laterality: N/A;   ESOPHAGOGASTRODUODENOSCOPY N/A 01/13/2018   Procedure: ESOPHAGOGASTRODUODENOSCOPY (EGD);  Surgeon: Rogene Houston, MD;  Location: [facility];  Service: Endoscopy;  Laterality: N/A;   EYE SURGERY  2013   cat ext bilateral .   EYE SURGERY  2002   laser   FOREIGN BODY REMOVAL  02/13/2018   Procedure: FOREIGN BODY REMOVAL;  Surgeon: Rogene Houston, MD;  Location: [facility];  Service: Endoscopy;;  foreign body removal which appears to be food debri in a stem form removed from  colon by DR. Sedillo- 2007   rod right leg - right wrist plate   GAS/FLUID EXCHANGE  03/25/2012   Procedure: GAS/FLUID EXCHANGE;  Surgeon: Hayden Pedro, MD;  Location: [place];  Service: Ophthalmology;  Laterality: Right;   GASTROSTOMY W/ FEEDING TUBE     ileoseomy  04/2018   IR SINUS/FIST TUBE CHK-NON GI  06/20/2018   LAPAROTOMY N/A 04/30/2018   Procedure: EXPLORATORY LAPAROTOMY  LYSIS OF ADHESIONS SMALL BOWEL RESECTION WITH ANASTOMSIS LOOP ILEOSTOMY PLACEMENT OF DRAINS AND WOUND VAC;  Surgeon: Donnie Mesa,  MD;  Location: [place];  Service: General;  Laterality: N/A;   PARS PLANA VITRECTOMY  03/25/2012   Procedure: PARS PLANA VITRECTOMY WITH 25 GAUGE;  Surgeon: Hayden Pedro, MD;  Location: [place];  Service: Ophthalmology;  Laterality: Right;     OB History   No obstetric history on file.      Home Medications    Prior to Admission medications   Medication Sig Start Date End Date Taking? Authorizing Provider  albuterol (ACCUNEB) 0.63 MG/3ML nebulizer solution as needed. 10/03/18   [provider]  diltiazem (CARDIZEM CD) 120 MG 24 hr capsule Take 1 capsule (120 mg total) by mouth daily. Patient taking differently: Take 60 mg by mouth 3 (three) times daily.  05/13/18 05/13/19  Thurnell Lose, MD  ELIQUIS 5 MG TABS tablet 2 (two) times a day. 10/03/18   [provider]  HUMULIN 70/30 (70-30) 100 UNIT/ML injection  10/03/18   [provider]  HYDROcodone-acetaminophen (NORCO) 10-325 MG tablet Take 1 tablet by mouth 4 times a day as needed 06/13/18   Granville Lewis C, PA-C  Insulin Detemir (LEVEMIR FLEXPEN ) Inject 20 Units into the skin.    [provider]  latanoprost (XALATAN) 0.005 % ophthalmic solution 1 drop at bedtime.    [provider]  LORazepam (ATIVAN) 0.5 MG tablet every 8 (eight) hours. 10/03/18   [provider]  Magnesium 200 MG TABS Take 2 tablets by mouth 2 (two) times a day.     [provider]  metoprolol succinate (TOPROL-XL) 50 MG 24 hr tablet Take 1 tablet (50 mg total) by mouth daily. Take with or immediately following a meal. 06/18/18   Tat, Shanon Brow, MD  mirtazapine (REMERON) 30 MG tablet daily. 10/03/18   [provider]  omeprazole (PRILOSEC) 20 MG capsule 2 (two) times a day. 10/03/18   [provider]  potassium chloride SA (K-DUR,KLOR-CON) 20 MEQ tablet Take 15 mEq by mouth daily.     [provider]  promethazine (PHENERGAN) 25 MG tablet as needed. 10/03/18   [provider]    sertraline (ZOLOFT) 100 MG tablet daily. 10/03/18   [provider]  SLOW-MAG 71.5-119 MG TBEC SR tablet 2 (two) times daily. Give 143 mg by mouth three times a day     [provider]  Tafluprost (ZIOPTAN) 0.0015 % SOLN Place 1 drop into both eyes at bedtime.     [provider]  timolol (BETIMOL) 0.5 % ophthalmic solution Place 1 drop into both eyes 2 (two) times daily.     [provider]  Tofacitinib Citrate (XELJANZ) 5 MG TABS Take 1 tablet by mouth daily. On hold    [provider]  torsemide (DEMADEX) 20 MG tablet daily. 10/03/18   [provider]  Vitamin D, Ergocalciferol, (DRISDOL) 1.25 MG (50000 UT) CAPS capsule Take 50,000 Units by mouth every Thursday.  [provider]    Family History Family History  Problem Relation Age of Onset   Hypertension Daughter    Rheum arthritis Maternal Grandmother     Social History Social History   Tobacco Use   Smoking status: Former Smoker    Packs/day: 0.25    Years: 5.00    Pack years: 1.25    Types: Cigarettes    Last attempt to quit: 06/11/1984    Years since quitting: 34.4   Smokeless tobacco: Never Used  Substance Use Topics   Alcohol use: Yes    Comment: occ   Drug use: No    Types: Hydrocodone     Allergies   Lactose intolerance (gi); Butrans [buprenorphine]; and Simvastatin   Review of Systems Review of Systems ROS: Statement: All systems negative except as marked or noted in the HPI; Constitutional: Negative for fever and chills. ; ; Eyes: Negative for eye pain, redness and discharge. ; ; ENMT: Negative for ear pain, hoarseness, nasal congestion, sinus pressure and sore throat. ; ; Cardiovascular: Negative for chest pain, palpitations, diaphoresis, +dyspnea and peripheral edema. ; ; Respiratory: Negative for cough, wheezing and stridor. ; ; Gastrointestinal: Negative for nausea, vomiting, diarrhea, abdominal pain, blood in stool, hematemesis,  jaundice and rectal bleeding. . ; ; Genitourinary: Negative for dysuria, flank pain and hematuria. ; ; Musculoskeletal: Negative for back pain and neck pain. Negative for swelling and trauma.; ; Skin: Negative for pruritus, rash, abrasions, blisters, bruising and skin lesion.; ; Neuro: Negative for headache, lightheadedness and neck stiffness. Negative for weakness, altered level of consciousness, altered mental status, extremity weakness, paresthesias, involuntary movement, seizure and syncope.       Physical Exam Updated Vital Signs BP 97/81    Pulse (!) 138    Temp 98.3 F (36.8 C) (Oral)    Resp 16    Ht 5\' 6"  (1.676 m)    Wt 96.3 kg    SpO2 99%    BMI 34.27 kg/m    Patient Vitals for the past 24 hrs:  BP Temp Temp src Pulse Resp SpO2 Height Weight  11/17/2018 1123 (!) 89/46 98.3 F (36.8 C) Oral -- -- -- -- --  11/20/2018 1100 (!) 76/65 -- -- (!) 108 17 99 % -- --  11/30/2018 1030 104/71 -- -- (!) 121 20 95 % -- --  11/10/2018 0933 96/61 -- -- (!) 116 13 98 % -- --  12/08/2018 0930 (!) 85/58 -- -- (!) 116 19 97 % -- --  11/28/2018 0900 (!) 99/57 -- -- (!) 113 11 97 % -- --  11/11/2018 0830 103/76 -- -- (!) 124 13 98 % -- --  11/24/2018 0800 (!) 82/67 -- -- (!) 118 17 96 % -- --  11/14/2018 0742 -- -- -- -- -- -- -- 96.3 kg  11/20/2018 0730 97/81 -- -- (!) 138 16 99 % -- --  12/07/2018 0711 (!) 121/57 98.3 F (36.8 C) Oral (!) 158 (!) 25 100 % -- --  11/23/2018 0708 -- -- -- -- -- -- 5\' 6"  (1.676 m) 99.8 kg     Physical Exam 0725: Physical examination:  Nursing notes reviewed; Vital signs and O2 SAT reviewed;  Constitutional: Well developed, Well nourished, in no acute distress; Head:  Normocephalic, atraumatic; Eyes: EOMI, PERRL, No scleral icterus; ENMT: Mouth and pharynx normal, Mucous membranes dry; Neck: Supple, Full range of motion, No lymphadenopathy; Cardiovascular: Tachycardic, irregular rate and rhythm, No gallop; Respiratory: Breath sounds diminished & equal bilaterally, No  wheezes.  Speaking  full sentences with ease, Normal respiratory effort/excursion; Chest: Nontender, Movement normal; Abdomen: Soft, Nontender, Nondistended, Normal bowel sounds; Genitourinary: No CVA tenderness; Extremities: Peripheral pulses normal, No tenderness, +2 pedal edema bilat. No calf tenderness or asymmetry.; Neuro: AA&Ox3, tangential historian. No facial droop. Speech clear. No gross focal motor deficits in extremities.; Skin: Color normal, Warm, Dry.   ED Treatments / Results  Labs (all labs ordered are listed, but only abnormal results are displayed)   EKG EKG Interpretation  Date/Time:  Wednesday November 12 2018 07:15:44 EDT Ventricular Rate:  149 PR Interval:    QRS Duration: 133 QT Interval:  329 QTC Calculation: 518 R Axis:   145 Text Interpretation:  Wide-QRS tachycardia Right bundle branch block When compared with ECG of 05/07/2018 and 05/27/2018 No significant change was found Confirmed by Francine Graven [phone]) on 12/02/2018 7:34:42 AM   Radiology   Procedures Procedures (including critical care time)  Medications Ordered in ED Medications  diltiazem (CARDIZEM) 1 mg/mL load via infusion 10 mg (10 mg Intravenous Bolus from Bag 11/26/2018 0755)    And  diltiazem (CARDIZEM) 100 mg in dextrose 5 % 100 mL (1 mg/mL) infusion (5 mg/hr Intravenous New Bag/Given 11/19/2018 0753)     Initial Impression / Assessment and Plan / ED Course  I have reviewed the triage vital signs and the nursing notes.  Pertinent labs & imaging results that were available during my care of the patient were reviewed by me and considered in my medical decision making (see chart for details).     MDM Reviewed: previous chart, nursing note and vitals Reviewed previous: labs and ECG Interpretation: labs, x-ray and ECG Total time providing critical care: 30-74 minutes. This excludes time spent performing separately reportable procedures and services. Consults: admitting MD    CRITICAL CARE Performed by:  Francine Graven Total critical care time: 35 minutes Critical care time was exclusive of separately billable procedures and treating other patients. Critical care was necessary to treat or prevent imminent or life-threatening deterioration. Critical care was time spent personally by me on the following activities: development of treatment plan with patient and/or surrogate as well as nursing, discussions with consultants, evaluation of patient's response to treatment, examination of patient, obtaining history from patient or surrogate, ordering and performing treatments and interventions, ordering and review of laboratory studies, ordering and review of radiographic studies, pulse oximetry and re-evaluation of patient's condition.  Results for orders placed or performed during the hospital encounter of 12/08/2018  SARS Coronavirus 2 (CEPHEID - Performed in [place] hospital lab), [facility] Order  Result Value Ref Range   SARS Coronavirus 2 NEGATIVE NEGATIVE  Brain natriuretic peptide  Result Value Ref Range   B Natriuretic Peptide 244.0 (H) 0.0 - 100.0 pg/mL  CBC with Differential  Result Value Ref Range   WBC 12.9 (H) 4.0 - 10.5 K/uL   RBC 4.01 3.87 - 5.11 MIL/uL   Hemoglobin 11.8 (L) 12.0 - 15.0 g/dL   HCT 40.6 36.0 - 46.0 %   MCV 101.2 (H) 80.0 - 100.0 fL   MCH 29.4 26.0 - 34.0 pg   MCHC 29.1 (L) 30.0 - 36.0 g/dL   RDW 19.3 (H) 11.5 - 15.5 %   Platelets 357 150 - 400 K/uL   nRBC 0.0 0.0 - 0.2 %   Neutrophils Relative % 81 %   Neutro Abs 10.6 (H) 1.7 - 7.7 K/uL   Lymphocytes Relative 7 %   Lymphs Abs 0.9 0.7 - 4.0 K/uL  Monocytes Relative 10 %   Monocytes Absolute 1.2 (H) 0.1 - 1.0 K/uL   Eosinophils Relative 1 %   Eosinophils Absolute 0.1 0.0 - 0.5 K/uL   Basophils Relative 1 %   Basophils Absolute 0.1 0.0 - 0.1 K/uL   Immature Granulocytes 0 %   Abs Immature Granulocytes 0.04 0.00 - 0.07 K/uL  Comprehensive metabolic panel  Result Value Ref Range   Sodium 140 135 - 145 mmol/L    Potassium 4.5 3.5 - 5.1 mmol/L   Chloride 85 (L) 98 - 111 mmol/L   CO2 37 (H) 22 - 32 mmol/L   Glucose, Bld 130 (H) 70 - 99 mg/dL   BUN 49 (H) 8 - 23 mg/dL   Creatinine, Ser 1.30 (H) 0.44 - 1.00 mg/dL   Calcium 9.3 8.9 - 10.3 mg/dL   Total Protein 6.4 (L) 6.5 - 8.1 g/dL   Albumin 2.8 (L) 3.5 - 5.0 g/dL   AST 51 (H) 15 - 41 U/L   ALT 37 0 - 44 U/L   Alkaline Phosphatase 150 (H) 38 - 126 U/L   Total Bilirubin 0.5 0.3 - 1.2 mg/dL   GFR calc non Af Amer 40 (L) >60 mL/min   GFR calc Af Amer 47 (L) >60 mL/min   Anion gap 18 (H) 5 - 15  Troponin I - Once  Result Value Ref Range   Troponin I 0.03 (HH) <0.03 ng/mL  Lactic acid, plasma  Result Value Ref Range   Lactic Acid, Venous 1.3 0.5 - 1.9 mmol/L  Protime-INR  Result Value Ref Range   Prothrombin Time 16.4 (H) 11.4 - 15.2 seconds   INR 1.3 (H) 0.8 - 1.2  Magnesium  Result Value Ref Range   Magnesium 1.4 (L) 1.7 - 2.4 mg/dL  Urinalysis, Routine w reflex microscopic  Result Value Ref Range   Color, Urine YELLOW YELLOW   APPearance CLOUDY (A) CLEAR   Specific Gravity, Urine 1.014 1.005 - 1.030   pH 7.0 5.0 - 8.0   Glucose, UA NEGATIVE NEGATIVE mg/dL   Hgb urine dipstick NEGATIVE NEGATIVE   Bilirubin Urine NEGATIVE NEGATIVE   Ketones, ur NEGATIVE NEGATIVE mg/dL   Protein, ur 30 (A) NEGATIVE mg/dL   Nitrite NEGATIVE NEGATIVE   Leukocytes,Ua LARGE (A) NEGATIVE   RBC / HPF 0-5 0 - 5 RBC/hpf   WBC, UA >50 (H) 0 - 5 WBC/hpf   Bacteria, UA MANY (A) NONE SEEN   Squamous Epithelial / LPF 0-5 0 - 5   WBC Clumps PRESENT    Mucus PRESENT    Budding Yeast PRESENT    Hyaline Casts, UA PRESENT    Uric Acid Crys, UA PRESENT    Dg Chest Port 1 View Result Date: 12/06/2018 CLINICAL DATA:  Shortness of breath 3 days EXAM: PORTABLE CHEST 1 VIEW COMPARISON:  None. FINDINGS: Large right pleural effusion. Bilateral interstitial prominence. Trace left pleural effusion. No pneumothorax. Stable cardiomediastinal silhouette. Prior median  sternotomy. No acute osseous abnormality. IMPRESSION: 1. Large right pleural effusion.  Trace left pleural effusion. Electronically Signed   By: Kathreen Devoid   On: 11/13/2018 08:18     [name] was evaluated in Emergency Department on 12/06/2018 for the symptoms described in the history of present illness. She was evaluated in the context of the global COVID-19 pandemic, which necessitated consideration that the patient might be at risk for infection with the SARS-CoV-2 virus that causes COVID-19. Institutional protocols and algorithms that pertain to the evaluation of patients at  risk for COVID-19 are in a state of rapid change based on information released by regulatory bodies including the CDC and federal and state organizations. These policies and algorithms were followed during the patient's care in the ED.    0730:  Pt with known RBBB, afib, on cardizem and eliquis at home. Pt states she did not take her meds today. Will dose IV cardizem bolus and gtt now for afib/RVR.  1015:  HR trending downward on IV cardizem gtt. BUN/Cr mildly elevated, and BP soft while on IV cardizem; judicious IVF given. BNP elevated, but no overt CHF on CXR. Magnesium repleted IV.  Udip with possible UTI, but pt denies dysuria, no fevers: UC pending.  T/C returned from [facility] Dr. Carles Collet, case discussed, including:  HPI, pertinent PM/SHx, VS/PE, dx testing, ED course and treatment:  Agreeable to admit.    Final Clinical Impressions(s) / ED Diagnoses   Final diagnoses:  None    ED Discharge Orders    None       Francine Graven, DO 11/13/18 1816

## 2018-11-12 NOTE — ED Notes (Signed)
Updated husband with pt's permission.

## 2018-11-12 NOTE — ED Triage Notes (Signed)
Pt from home with husband.  Been SOB x 3 weeks with worsening sob since last night.  Pt states " my husband popped a hole in my colostomy bag last night and it made me feel better"

## 2018-11-13 ENCOUNTER — Inpatient Hospital Stay (HOSPITAL_COMMUNITY): Payer: Medicare Other

## 2018-11-13 DIAGNOSIS — J90 Pleural effusion, not elsewhere classified: Secondary | ICD-10-CM

## 2018-11-13 DIAGNOSIS — N390 Urinary tract infection, site not specified: Secondary | ICD-10-CM

## 2018-11-13 DIAGNOSIS — J9621 Acute and chronic respiratory failure with hypoxia: Secondary | ICD-10-CM

## 2018-11-13 DIAGNOSIS — E1165 Type 2 diabetes mellitus with hyperglycemia: Secondary | ICD-10-CM

## 2018-11-13 DIAGNOSIS — I4891 Unspecified atrial fibrillation: Secondary | ICD-10-CM

## 2018-11-13 DIAGNOSIS — N183 Chronic kidney disease, stage 3 (moderate): Secondary | ICD-10-CM

## 2018-11-13 DIAGNOSIS — I5031 Acute diastolic (congestive) heart failure: Secondary | ICD-10-CM

## 2018-11-13 DIAGNOSIS — Z515 Encounter for palliative care: Secondary | ICD-10-CM

## 2018-11-13 DIAGNOSIS — I25708 Atherosclerosis of coronary artery bypass graft(s), unspecified, with other forms of angina pectoris: Secondary | ICD-10-CM

## 2018-11-13 DIAGNOSIS — I361 Nonrheumatic tricuspid (valve) insufficiency: Secondary | ICD-10-CM

## 2018-11-13 DIAGNOSIS — Z7189 Other specified counseling: Secondary | ICD-10-CM

## 2018-11-13 LAB — MAGNESIUM: Magnesium: 1.8 mg/dL (ref 1.7–2.4)

## 2018-11-13 LAB — GLUCOSE, CAPILLARY
Glucose-Capillary: 150 mg/dL — ABNORMAL HIGH (ref 70–99)
Glucose-Capillary: 140 mg/dL — ABNORMAL HIGH (ref 70–99)
Glucose-Capillary: 160 mg/dL — ABNORMAL HIGH (ref 70–99)
Glucose-Capillary: 123 mg/dL — ABNORMAL HIGH (ref 70–99)

## 2018-11-13 LAB — CBC
HCT: 37.2 % (ref 36.0–46.0)
Hemoglobin: 10.9 g/dL — ABNORMAL LOW (ref 12.0–15.0)
MCH: 29 pg (ref 26.0–34.0)
MCHC: 29.3 g/dL — ABNORMAL LOW (ref 30.0–36.0)
MCV: 98.9 fL (ref 80.0–100.0)
Platelets: 354 10*3/uL (ref 150–400)
RBC: 3.76 MIL/uL — ABNORMAL LOW (ref 3.87–5.11)
RDW: 19.3 % — ABNORMAL HIGH (ref 11.5–15.5)
WBC: 13.1 10*3/uL — ABNORMAL HIGH (ref 4.0–10.5)
nRBC: 0 % (ref 0.0–0.2)

## 2018-11-13 LAB — BASIC METABOLIC PANEL
Anion gap: 13 (ref 5–15)
BUN: 39 mg/dL — ABNORMAL HIGH (ref 8–23)
CO2: 38 mmol/L — ABNORMAL HIGH (ref 22–32)
Calcium: 8.7 mg/dL — ABNORMAL LOW (ref 8.9–10.3)
Chloride: 90 mmol/L — ABNORMAL LOW (ref 98–111)
Creatinine, Ser: 1.16 mg/dL — ABNORMAL HIGH (ref 0.44–1.00)
GFR calc Af Amer: 54 mL/min — ABNORMAL LOW (ref >60)
GFR calc non Af Amer: 46 mL/min — ABNORMAL LOW (ref >60)
Glucose, Bld: 138 mg/dL — ABNORMAL HIGH (ref 70–99)
Potassium: 3.4 mmol/L — ABNORMAL LOW (ref 3.5–5.1)
Sodium: 141 mmol/L (ref 135–145)

## 2018-11-13 LAB — ECHOCARDIOGRAM COMPLETE
Height: 66 in
Weight: 3460.34 oz

## 2018-11-13 MED ORDER — POTASSIUM CHLORIDE 20 MEQ/15ML (10%) PO SOLN
40.0000 meq | Freq: Every day | ORAL | Status: DC
  Administered 2018-11-13 – 2018-11-14 (×2): 40 meq
  Filled 2018-11-13 (×2): qty 30

## 2018-11-13 MED ORDER — OSMOLITE 1.5 CAL PO LIQD
1000.0000 mL | ORAL | Status: DC
  Administered 2018-11-13 – 2018-11-14 (×2): 1000 mL
  Filled 2018-11-13 (×5): qty 1000

## 2018-11-13 MED ORDER — PRO-STAT SUGAR FREE PO LIQD
30.0000 mL | Freq: Two times a day (BID) | ORAL | Status: DC
  Administered 2018-11-13 – 2018-11-14 (×2): 30 mL
  Filled 2018-11-13 (×2): qty 30

## 2018-11-13 MED ORDER — FREE WATER
200.0000 mL | Freq: Three times a day (TID) | Status: DC
  Administered 2018-11-13 – 2018-11-14 (×3): 200 mL

## 2018-11-13 MED ORDER — DILTIAZEM HCL 30 MG PO TABS
30.0000 mg | ORAL_TABLET | Freq: Four times a day (QID) | ORAL | Status: DC
  Administered 2018-11-13 – 2018-11-14 (×4): 30 mg via ORAL
  Filled 2018-11-13 (×4): qty 1

## 2018-11-13 MED ORDER — HYDROCODONE-ACETAMINOPHEN 10-325 MG PO TABS
1.0000 | ORAL_TABLET | Freq: Once | ORAL | Status: AC
  Administered 2018-11-13: 03:00:00 1 via ORAL
  Filled 2018-11-13: qty 1

## 2018-11-13 MED ORDER — ALUM & MAG HYDROXIDE-SIMETH 200-200-20 MG/5ML PO SUSP
30.0000 mL | ORAL | Status: DC | PRN
  Administered 2018-11-14: 30 mL via ORAL
  Filled 2018-11-13: qty 30

## 2018-11-13 NOTE — Progress Notes (Addendum)
Initial Nutrition Assessment DOCUMENTATION CODES:  Obesity unspecified  INTERVENTION:  Increase rate of Osmolite 1.5 to 45cc/hr (1080 ml per day) and add Prostat 30 ml BID to provide 1820 kcals, 98 gm protein, 823 ml free water daily.  Patient is not currently receiving any IVF support, will add modest amount of free water (200 cc TID) given her HF and defer titration to MD per their judgment    NUTRITION DIAGNOSIS:  Increased nutrient needs related to wound healing and her underlying chronic illnesses as evidenced by the estimated nutritional requirements for these respective conditions.   GOAL:  Patient will meet greater than or equal to 90% of their needs  MONITOR:  PO intake, Labs, TF tolerance, Weight trends, I & O's  REASON FOR ASSESSMENT:  Consult Enteral/tube feeding initiation and management, Assessment of nutrition requirement/status  ASSESSMENT:  73 y/o female PMHx CAD, CVA, HLD, HTN, DM2, RA, CKD3, Chronic pain. Hospitalized for perforated sigmoid diverticulitis 10/26-12/03 s/p SBR w/ ileostomy. Pt had been in multiple SNFs and an LTACH up until the end of arpil. Had peg placed during one of these stays. Now presents to ED w/ 3 weeks of SOB, increasing BLE edema and orthopnea. In ED, found to have Large L pleural effusion and be in Afib w/ RVR. Admitted for AoC HF   RD operating remotely due to covid precautions. Was able to reach patient by phone, but was only able to speak with her for a short while before she needed to hang up d/t SOB.   She reports she had her PEG placed 8 weeks ago during her stay at [place]. She says it was placed "because I wasn't eating enough". She says she still takes oral intake, but it is insignificant in amount. She sounds to eat only for pleasure/comfort now. She says her weight is 212. She is not certain about her home TF regimen, but thought her current orders sounded familiar.   RD reached out to [facility] and received patients home  TF orders. Have not heard back as of yet. Will addend this note when have obtained info regarding her home TF regimen. Addendum: Home health unable to locate pts detailed TF orders, however it is noted pt received a shipment of 94 cans of Osmolite 1.5 at start of May. If supplying for 1 month, this equates to roughly 3 cans/d. This would provide only 1065 kcals and 45g pro a day. If this is correct, she would have needed to be eating ~500 kcals a day to maintain her weight. Given her report today, it did not sound like she was accomplishing this and may be in part why her weight is continuing to trend down.   Weight wise, at the time of patient's major hospitalization last fall, she weighed 255-260 lbs. Per chart, the patient was admitted at 209.6 lbs. This equates to a loss of 45-50 lbs (~17% of bw) in just over 8 months. This meets criteria for malnutrition. Short term, her weight has been relatively stable, though still appears to show a gradual negative trend. She was 224 in Jan, 220 in June and now just under 210.   Pt reportedly takes little by mouth. Will increase TF to meet 100% of her needs and monitor tolerance. Add prostat BID for wound healing. She currently does not have any IVF running. TF only providing ~800 cc free water. Will provide conservative number of flushes given her AoC HF. MD can adjust as needed.   Labs: BG: 138-150,  WBC:12.9->13.1, Albumin:2.8, BUN/Creat:49/1.30->39/1.16 Meds: Lasix, Insulin, mag ox, ppi, kcl, IV abx,   Recent Labs  Lab 11/21/2018 0908 11/13/18 0414  NA 140 141  K 4.5 3.4*  CL 85* 90*  CO2 37* 38*  BUN 49* 39*  CREATININE 1.30* 1.16*  CALCIUM 9.3 8.7*  MG 1.4* 1.8  GLUCOSE 130* 138*   NUTRITION - FOCUSED PHYSICAL EXAM: Unable to conduct  Diet Order:   Diet Order            Diet Carb Modified Fluid consistency: Thin; Room service appropriate? Yes  Diet effective now             EDUCATION NEEDS:  Not appropriate for education at this time    Skin:  PU stage II to L, medial Buttocks  Last BM:  6/3 (output via ileostomy)   Height:  Ht Readings from Last 1 Encounters:  11/19/2018 5\' 6"  (1.676 m)   Weight:  Wt Readings from Last 1 Encounters:  11/13/18 98.1 kg   Wt Readings from Last 10 Encounters:  11/13/18 98.1 kg  10/22/18 99.8 kg  07/17/17 122.6 kg  06/13/18 101.6 kg  06/08/18 103.4 kg  06/06/18 103.4 kg  05/10/18 108.7 kg  02/13/18 116.1 kg  01/13/18 120.7 kg  12/24/17 121 kg   Ideal Body Weight:  59.1 kg  BMI:  Body mass index is 34.91 kg/m.  Estimated Nutritional Needs:  Kcal:  1650-1850 kcals (17-19 kcal/kg bw) Protein:  89-105g Pro (1.5-1.8g/kg ibw) Fluid:  1.7-1.9 L fluid (72ml/kcal)  Burtis Junes RD, LDN, CNSC Clinical Nutrition Available Tues-Sat via Pager: [phone] 11/13/2018 11:42 AM

## 2018-11-13 NOTE — Consult Note (Signed)
Consultation Note Date: 11/13/2018   Patient Name: [name]  DOB: 22-Jun-1945  MRN: [id number]  Age / Sex: 73 y.o., female  PCP: Melody Squibb, MD Referring Physician: Orson Eva, MD  Reason for Consultation: Establishing goals of care  HPI/Patient Profile: 73 y.o. female  with past medical history of CAD, CABG 1986, HLD, HTN, TIA, DM type 2, rheumatoid arthritis, chronic pain/fibromyalgia admitted on 11/14/2018 with shortness of breath x3 weeks. Prolonged hospitalization 04/05/18-05/13/18 due to perforated sigmoid diverticulitis with abscess s/p exploratory lap with lysis of adhesions and small bowel resection with loop ileostomy on 04/30/18. Also admitted from 06/13/18-06/17/18 for new onset afib RVR. Patient developed pneumonia requiring intubation. Spent ~1 month at [facility] s/p PEG tube placement and eventually discharge to [facility] in [place]. Patient has been home for about 6 weeks. Current hospital admission for acute diastolic heart failure, acute on chronic respiratory failure with hypoxia, afib RVR. Cardiology consulted. Palliative medicine consultation for goals of care.   Clinical Assessment and Goals of Care:  I have reviewed medical records, discussed with care team and met with patient at bedside to discuss diagnosis, GOC, EOL wishes, disposition and options.  Patient is awake, alert, oriented and able to participate in conversation. She is mildly short of breath at rest. Currently on 3L [place].   Introduced Palliative Medicine as specialized medical care for people living with serious illness. It focuses on providing relief from the symptoms and stress of a serious illness. The goal is to improve quality of life for both the patient and the family.  We discussed a brief life review of the patient. Married to husband, [name] for 52 years. They have two living children, son and daughter. Prior to admission in  October, patient ambulatory and independent.   Discussed course of health decline since October. Discussed events leading up to admission and course of hospitalization including diagnoses and interventions.   I attempted to elicit values and goals of care important to the patient. Patient tearful throughout conversation. She speaks of her children abandoning her and no longer wanting to care for her at home. She speaks of the importance of discharging back to her home environment and her hope to continue to work with therapy and gain the ability to walk again. She wishes more than anything to "get back to normal."   Advanced directives, concepts specific to code status, artifical feeding and hydration were discussed. Patient does not have a documented living will but verbalizes she would wish for husband to be HCPOA. Explored her wishes on heroic interventions including resuscitation/life support. At this point, patient desires FULL code. If she is critically ill and unable to make decisions for herself, [name] wishes for her husband to have discussions with care team and make medical decisions for her based on recommendations.   Discussed quality of life versus quantity. Patient acknowledges she would not wish to live as a "vegetable" in a nursing home. She occasionally prays that the 'lord will take me at night when I'm sleeping.'  She shares that her daughter believed this meant she wanted to kill herself. Patient denies suicidal ideation. Emotional/spiritual support provided.   She is hopeful her husband can handle caring for her since her child are no longer helping. They have arranged a caregiver throughout the week that will help with ADL's. She has a hospital bed, BSC and oxygen at home.   Encouraged ongoing discussions with her husband regarding [place] and EOL wishes. Patient requests I call her husband to update on plan of care. She does not want me to call her daughter.   Answered questions and  concerns. Therapeutic listening. Patient shares appreciation for the conversation and feels better after having someone to talk to.    1610: Spoke with husband, [name] via telephone. Introduced palliative care. Discussed plan of care including hospital diagnoses and interventions.   Husband also shares challenging family dynamics since [name] has required a caregiver. "Torn the family apart." He shares the plan for intermittent caregiver assistance once discharged back home. Updated him on my conversation with [name] regarding [place] and advance directives. Encouraged ongoing discussions about goals as medical conditions progress. Discussed what quality of life looks like for his wife and what is important moving forward. Answered questions and concerns.     SUMMARY OF RECOMMENDATIONS    GOC with patient and husband via telephone. Patient does not want PMT provider to call daughter.   Patient desires FULL code/FULL scope treatment.   Encouraged completion of advance directive and ongoing discussions with her husband regarding [place] and EOL wishes. Patient does acknowledge she would not wish to live as a "vegetable." If critically ill and incapable of making medical decisions, patient desires that her husband discuss with care team and make decisions based off care team recommendations.   Plan is for discharge home with home health services. Patient is hopeful to return home and continue physical therapy efforts. She is hopeful to be able to walk again.   Per patient and husband, her health decline and need for caregivers has "torn the family apart." Daughter and son minimally involved in care. Patient/husband planning to hire intermittent caregiver once home.   Could benefit from outpatient palliative referral but unfortunately limited in [facility].   Code Status/Advance Care Planning:  Full code  Symptom Management:   Per attending  Palliative Prophylaxis:   Aspiration, Delirium  Protocol, Frequent Pain Assessment, Oral Care and Turn Reposition  Additional Recommendations (Limitations, Scope, Preferences):  Full Scope Treatment  Psycho-social/Spiritual:   Desire for further Chaplaincy support:yes  Additional Recommendations: Caregiving  Support/Resources and Compassionate Wean Education  Prognosis:   Unable to determine  Discharge Planning: Home with Home Health      Primary Diagnoses: Present on Admission:  Acute diastolic CHF (congestive heart failure) (HCC)  Rheumatoid arthritis (HCC)  Paroxysmal atrial fibrillation ([place])  Hypomagnesemia   I have reviewed the medical record, interviewed the patient and family, and examined the patient. The following aspects are pertinent.  Past Medical History:  Diagnosis Date   Abscess of sigmoid colon due to diverticulitis    Anginal pain [facility])    sees Dr Einar Gip.    Arthritis    rheumatoid .Marland Kitchen    CAD (coronary artery disease), native coronary artery 10/22/2018   Diabetes mellitus without complication (HCC)    Metformin 2.3.2017   Diverticulitis    Diverticulitis of large intestine with perforation and abscess 04/05/2018   Fibromyalgia    GERD (gastroesophageal reflux disease)    High cholesterol  Liver disease 08- 2012   per Dr Dagmar Hait pt has enlarged liver   Peripheral vascular disease ([place])    legs   Pneumonia 06/17/2016   Sleep apnea    sleep study  oct 2012   Stroke [facility]) 2011   [place]      Social History   Socioeconomic History   Marital status: Married    Spouse name: [name]   Number of children: 2   Years of education: 12   Highest education level: Not on file  Occupational History   Occupation: Retired  Scientist, product/process development strain: Not on file   Food insecurity:    Worry: Not on file    Inability: Not on file   Transportation needs:    Medical: Not on file    Non-medical: Not on file  Tobacco Use   Smoking status: Former Smoker      Packs/day: 0.25    Years: 5.00    Pack years: 1.25    Types: Cigarettes    Last attempt to quit: 06/11/1984    Years since quitting: 34.4   Smokeless tobacco: Never Used  Substance and Sexual Activity   Alcohol use: Yes    Comment: occ   Drug use: No    Types: Hydrocodone   Sexual activity: Yes  Lifestyle   Physical activity:    Days per week: Not on file    Minutes per session: Not on file   Stress: Not on file  Relationships   Social connections:    Talks on phone: Not on file    Gets together: Not on file    Attends religious service: Not on file    Active member of club or organization: Not on file    Attends meetings of clubs or organizations: Not on file    Relationship status: Not on file  Other Topics Concern   Not on file  Social History Narrative   Not on file   Family History  Problem Relation Age of Onset   Hypertension Daughter    Rheum arthritis Maternal Grandmother    Scheduled Meds:  apixaban  5 mg Oral BID   diltiazem  30 mg Oral Q6H   feeding supplement (PRO-STAT SUGAR FREE 64)  30 mL Per Tube BID   free water  200 mL Per Tube Q8H   furosemide  40 mg Intravenous BID   insulin aspart  0-15 Units Subcutaneous TID WC   insulin aspart  0-5 Units Subcutaneous QHS   insulin glargine  10 Units Subcutaneous Daily   latanoprost  1 drop Both Eyes QHS   magnesium oxide   Oral BID   metoprolol tartrate  50 mg Oral BID   pantoprazole  40 mg Oral Daily   potassium chloride  40 mEq Per Tube Daily   sodium chloride flush  3 mL Intravenous Q12H   timolol  1 drop Both Eyes BID   Continuous Infusions:  sodium chloride     cefTRIAXone (ROCEPHIN)  IV 1 g (11/13/18 1106)   feeding supplement (OSMOLITE 1.5 CAL) 1,000 mL (11/13/18 1453)   PRN Meds:.sodium chloride, acetaminophen, ondansetron (ZOFRAN) IV, sodium chloride flush Medications Prior to Admission:  Prior to Admission medications   Medication Sig Start Date End Date  Taking? Authorizing Provider  albuterol (ACCUNEB) 0.63 MG/3ML nebulizer solution Take 1 ampule by nebulization every 6 (six) hours as needed for wheezing or shortness of breath.  10/03/18  Yes [provider]  diltiazem (CARDIZEM CD)  120 MG 24 hr capsule Take 1 capsule (120 mg total) by mouth daily. 05/13/18 05/13/19 Yes Thurnell Lose, MD  ELIQUIS 5 MG TABS tablet Take 5 mg by mouth 2 (two) times a day.  10/03/18  Yes [provider]  GLIPIZIDE XL 10 MG 24 hr tablet Take 1 tablet by mouth daily. 09/06/18  Yes [provider]  HUMULIN 70/30 (70-30) 100 UNIT/ML injection Inject 10 Units into the skin 2 (two) times daily with a meal.  10/03/18  Yes [provider]  HYDROcodone-acetaminophen (NORCO) 10-325 MG tablet Take 1 tablet by mouth 4 times a day as needed 06/13/18  Yes Lassen, Arlo C, PA-C  hydroxychloroquine (PLAQUENIL) 200 MG tablet Take 200 mg by mouth daily.   Yes [provider]  LANTUS 100 UNIT/ML injection Inject 20 Units into the skin at bedtime.  10/03/18  Yes [provider]  latanoprost (XALATAN) 0.005 % ophthalmic solution Place 1 drop into both eyes at bedtime.    Yes [provider]  LORazepam (ATIVAN) 0.5 MG tablet Take 0.5 mg by mouth every 8 (eight) hours as needed.  10/03/18  Yes [provider]  Magnesium 200 MG TABS Take 2 tablets by mouth 2 (two) times a day.    Yes [provider]  megestrol (MEGACE) 40 MG tablet Take 80 mg by mouth daily.   Yes [provider]  omeprazole (PRILOSEC) 20 MG capsule Take 20 mg by mouth 2 (two) times daily before a meal.  10/03/18  Yes [provider]  potassium chloride SA (K-DUR,KLOR-CON) 20 MEQ tablet Take 15 mEq by mouth daily.    Yes [provider]  predniSONE (DELTASONE) 5 MG tablet Take 5 mg by mouth daily with breakfast.   Yes [provider]  promethazine (PHENERGAN) 25 MG tablet Take 25 mg by mouth every 4 (four) hours as  needed for nausea.  10/03/18  Yes [provider]  Tafluprost (ZIOPTAN) 0.0015 % SOLN Place 1 drop into both eyes at bedtime.    Yes [provider]  timolol (BETIMOL) 0.5 % ophthalmic solution Place 1 drop into both eyes 2 (two) times daily.    Yes [provider]  torsemide (DEMADEX) 20 MG tablet Take 20 mg by mouth 3 (three) times daily.  10/03/18  Yes [provider]  metoprolol tartrate (LOPRESSOR) 50 MG tablet Take 1 tablet by mouth 2 (two) times a day. 10/03/18   [provider]  Olmesartan-amLODIPine-HCTZ 40-5-25 MG TABS Take 1 tablet by mouth daily. 10/19/18   [provider]  Vitamin D, Ergocalciferol, (DRISDOL) 1.25 MG (50000 UT) CAPS capsule Take 50,000 Units by mouth every Thursday.     [provider]   Allergies  Allergen Reactions   Lactose Intolerance (Gi) Other (See Comments)    G.IUnice Cobble [Buprenorphine] Rash and Other (See Comments)    Infected skin underneath application   Simvastatin Rash   Review of Systems  Constitutional: Positive for activity change and appetite change.  Respiratory: Positive for shortness of breath.   Neurological: Positive for weakness.    Physical Exam Vitals signs and nursing note reviewed.  Constitutional:      General: She is awake.     Appearance: She is ill-appearing.  HENT:     Head: Atraumatic.  Cardiovascular:     Rate and Rhythm: Rhythm irregularly irregular.  Pulmonary:     Effort: No tachypnea, accessory muscle usage or respiratory distress.     Comments:  Intermittent dyspnea at rest. 3L [place] Abdominal:     Tenderness: There is no abdominal tenderness.     Comments: PEG tube  Skin:    General: Skin is warm and dry.  Neurological:     Mental Status: She is alert and oriented to person, place, and time.  Psychiatric:        Attention and Perception: Attention normal.        Mood and Affect: Mood normal.        Speech: Speech normal.        Behavior:  Behavior normal.        Cognition and Memory: Cognition normal.    Vital Signs: BP (!) 125/94    Pulse (!) 123    Temp (!) 97 F (36.1 C) (Oral)    Resp 20    Ht _0  (1.676 m)    Wt 98.1 kg    SpO2 94%    BMI 34.91 kg/m  Pain Scale: 0-10 POSS *See Group Information*: 1-Acceptable,Awake and alert Pain Score: 0-No pain   SpO2: SpO2: 94 % O2 Device:SpO2: 94 % O2 Flow Rate: .O2 Flow Rate (L/min): 3 L/min  IO: Intake/output summary:   Intake/Output Summary (Last 24 hours) at 11/13/2018 2215 Last data filed at 11/13/2018 1641 Gross per 24 hour  Intake 951.54 ml  Output 900 ml  Net 51.54 ml    LBM: Last BM Date: 11/13/18 Baseline Weight: Weight: 99.8 kg Most recent weight: Weight: 98.1 kg     Palliative Assessment/Data: PPS 40%   Flowsheet Rows     Most Recent Value  Intake Tab  Referral Department  Hospitalist  Unit at Time of Referral  ICU  Palliative Care Primary Diagnosis  Cardiac  Date Notified  11/13/18  Palliative Care Type  New Palliative care  Reason for referral  Clarify Goals of Care  Date first seen by Palliative Care  11/13/18  # of days Palliative referral response time  0 Day(s)  Clinical Assessment  Palliative Performance Scale Score  40%  Psychosocial & Spiritual Assessment  Palliative Care Outcomes  Patient/Family meeting held?  Yes  Who was at the meeting?  patient and husband  Palliative Care Outcomes  Clarified goals of care, Provided psychosocial or spiritual support, ACP counseling assistance      Time In/Out: 1130-1230, 3903-0092 Time Total: 87mn Greater than 50%  of this time was spent counseling and coordinating care related to the above assessment and plan.  Signed by:  MIhor Dow DNP, FNP-C Palliative Medicine Team  Phone: [phone]: [phone]  Please contact Palliative Medicine Team phone at [phone] questions and concerns.  For individual provider: See AShea Evans

## 2018-11-13 NOTE — TOC Initial Note (Signed)
Transition of Care [facility]) - Initial/Assessment Note    Patient Details  Name: [name] MRN: [id number] Date of Birth: 1946/02/22  [facility] CM/SW Contact:    Shade Flood, LCSW Phone Number: 11/13/2018, 10:37 AM  Clinical Narrative:                  Pt admitted from home. Pt was discharged home in late April from [facility]. Pt was hospitalized in January, then went to [facility] for rehab, had to go to [place] from there and then went to [facility] after LTAC. Pt used up almost all of her Medicare SNF days. These Medicare days have likely not re-set as pt has to be without hospital or SNF care for a 60 consecutive day stretch before a new benefit period will begin. She does also have Tricare for Life and notes from [facility] indicate Medicaid was applied for.   Pt has been home with the assistance of her husband and has had [facility] for RN, PT, and OT. Pt is also on Home O2.  Anticipating pt will return home with HH at dc. TOC will follow.  Expected Discharge Plan: [place] Barriers to Discharge: Continued Medical Work up   Patient Goals and CMS Choice        Expected Discharge Plan and Services Expected Discharge Plan: [place] In-house Referral: Clinical Social Work     Living arrangements for the past 2 months: [place]                                      Prior Living Arrangements/Services Living arrangements for the past 2 months: Single Family Home Lives with:: Spouse Patient language and need for interpreter reviewed:: Yes Do you feel safe going back to the place where you live?: Yes      Need for Family Participation in Patient Care: Yes (Comment) Care giver support system in place?: Yes (comment) Current home services: DME, Home PT, Home RN, Home OT Criminal Activity/Legal Involvement Pertinent to Current Situation/Hospitalization: No - Comment as needed  Activities of Daily  Living Home Assistive Devices/Equipment: CBG Meter, Eyeglasses, Hospital bed, [employer], Ostomy supplies, Oxygen, Environmental consultant (specify type) ADL Screening (condition at time of admission) Patient's cognitive ability adequate to safely complete daily activities?: Yes Is the patient deaf or have difficulty hearing?: No Does the patient have difficulty seeing, even when wearing glasses/contacts?: No Does the patient have difficulty concentrating, remembering, or making decisions?: No Patient able to express need for assistance with ADLs?: Yes Does the patient have difficulty dressing or bathing?: Yes Independently performs ADLs?: No Communication: Independent Dressing (OT): Needs assistance Is this a change from baseline?: Pre-admission baseline Grooming: Needs assistance Is this a change from baseline?: Pre-admission baseline Feeding: Needs assistance Is this a change from baseline?: Pre-admission baseline Bathing: Needs assistance Is this a change from baseline?: Pre-admission baseline Toileting: Needs assistance Is this a change from baseline?: Pre-admission baseline In/Out Bed: Needs assistance Is this a change from baseline?: Pre-admission baseline Walks in Home: Needs assistance Is this a change from baseline?: Pre-admission baseline Does the patient have difficulty walking or climbing stairs?: Yes Weakness of Legs: Both Weakness of Arms/Hands: None  Permission Sought/Granted                  Emotional Assessment Appearance:: Appears stated age  Orientation: : Oriented to Self, Oriented to Place, Oriented to Situation Alcohol / Substance Use: Not Applicable Psych Involvement: No (comment)  Admission diagnosis:  Shortness of breath [R06.02] Hypoxia [R09.02] Pleural effusion on right [J90] Atrial fibrillation with rapid ventricular response (HCC) [I48.91] Patient Active Problem List   Diagnosis Date Noted  . Atrial fibrillation with rapid ventricular response ([place])    . Acute diastolic CHF (congestive heart failure) ([place]) 11/16/2018  . Acute on chronic respiratory failure with hypoxia ([place]) 11/23/2018  . Uncontrolled type 2 diabetes mellitus with hyperglycemia ([place]) 11/14/2018  . CAD (coronary artery disease), native coronary artery 10/22/2018  . Paroxysmal atrial fibrillation ([place]) 10/22/2018  . Atrial fibrillation with RVR ([place]) 06/15/2018  . Generalized weakness 06/15/2018  . Weakness 06/14/2018  . Ileostomy present ([place]) 06/13/2018  . Muscular deconditioning 06/13/2018  . Hypomagnesemia 06/13/2018  . CKD (chronic kidney disease) stage 3, GFR 30-59 ml/min (HCC) 04/06/2018  . Fibromyalgia 04/06/2018  . Anemia 04/05/2018  . Esophageal dysphagia 01/03/2018  . Rheumatoid arthritis ([place]) 06/27/2016  . Carpal tunnel syndrome 03/24/2014  . Paresthesias/numbness 03/24/2014  . Obesity, unspecified 09/23/2013  . Pulmonary nodules 07/03/2013  . TIA (transient ischemic attack) 06/20/2013  . Left arm weakness 06/20/2013  . DM (diabetes mellitus) ([place]) 06/20/2013  . HTN (hypertension) 06/20/2013  . Chronic pain 06/20/2013  . Vitreous hemorrhage ([place]) 03/25/2012   PCP:  [name], MD Pharmacy:   [facility] - [place], [address] [place] [address] Phone: [phone] Fax: [place], [place] [address] Phone: [phone] Fax: [phone]     Social Determinants of Health (SDOH) Interventions    Readmission Risk Interventions No flowsheet data found.

## 2018-11-13 NOTE — Progress Notes (Signed)
*  PRELIMINARY RESULTS* Echocardiogram 2D Echocardiogram has been performed by Gerald Stabs, RDCS.  [name] 11/13/2018, 12:51 PM

## 2018-11-13 NOTE — Progress Notes (Signed)
PROGRESS NOTE  [name] IHK:[id number] DOB: [birth year] DOA: 12/09/2018 PCP: [name], MD  Brief History:  73 y.o. female with medical history of coronary artery disease, hyperlipidemia, stroke, hypertension, diabetes mellitus, rheumatoid arthritis, fibromyalgia/chronic pain presenting with3-week history of shortness of breath.  The patient has been having associated increasing lower extremity edema and orthopnea.  Unfortunately, the patient is a difficult historian.   The patient was recently discharged from the nursing home in [place] approximately 6 weeks prior to this admission.  She was taking 60 mg of torsemide daily.  However in the past week, the patient had decrease it to 40 mg daily for unclear reasons.  The patient denies any chest pain, fever, chills, nausea, vomiting, diarrhea, abdominal pain, dysuria, hematuria.  The patient does not frequently weigh herself at home.  However, the patient was noted to have a cardiology visit at Dr. Einar Gip on 10/22/18 where she weighed 220 pounds.  The patient was recently admitted to the hospital from 04/05/2018 through 05/13/2018 due to perforated sigmoid diverticulitis with abscess. The patient hadan exploratory laparotomy with lysis of adhesions and small bowel resection with loop ileostomy on 04/30/2018 performed byDr. [name].  She was also admitted from 06/13/2018 through 06/17/2018 for new onset atrial fibrillation with RVR. After that admission, the patient went to [facility] rehab.  She subsequently developed pneumonia and required intubation.  She then spent a little over a month at [facility] where a PEG was placed.  Subsequently, she was discharged to [facility] in [place].   In the emergency department, the patient was afebrile hemodynamically stable albeit with soft blood pressures.  She was noted to have atrial fibrillation with RVR with heart rate in the 140s.  The patient was started on diltiazem drip.  She was started on IV furosemide.  Chest  x-ray showed large left pleural effusion with bilateral interstitial prominence.  BMP showed a serum creatinine 1.30.  WBC was 12.9.  Urinalysis showed greater than 50 WBC.  Assessment/Plan: Acute diastolic CHF -Continue IV furosemide 40 mg IV twice daily -daily weights--Neg 3.5 lbs since admission -accurate I/Os  Acute on chronic respiratory failure with hypoxia -normally on 2.5 L at home -currently stable on 4L>>3L -wean oxygen for sat >92%  Paroxysmal Atrial Fibrillation with RVR -continue apixaban -continue metoprolol as BP allows -continue diltiazem drip -consult cardiology as pt has soft BP and may need cardioversion/amio if no improvement  CKD stage 3 -baseline creatinine 1.0-1.3  Diabetes mellitus type 2, uncontrolled with hyperglycemia -10/13/2018 hemoglobin A1c 8.3 -Start reduced dose Lantus -NovoLog sliding scale -Holding glipizide  Pyuria -Start ceftriaxone pending urine culture data  Essential hypertension -Blood pressure is soft on diltiazem drip -Continue metoprolol succinate -Holding Isordil due to soft BPs  FEN -continue Osmolite enteral feeding  Coronary artery disease -No chest pain presently -Continuemetoprolol   Rheumatoid arthritis -Xeljanz on hold (previously on this x 3-4 yrs) -pt follows Dr. Amil Amen in outpatient setting  Hypomagnesemia -replete -continue po mag in addition to IV  Hypokalemia -replete  Chronic pain syndrome/Fibromyalgia -continue home dose hydrocodone  History of stroke and TIA -Continue apixaban  Stage 2 pressure injury -present on admission -local wound care  Goals of Care -daughter, patient and spouse appear to have varying goals -consult palliative to clarify and continue conversation    Disposition Plan:   Home in 2-3 days  Family Communication:   Family at bedside  Consultants:  Cardiology, palliative  Code Status:  FULL  DVT Prophylaxis:  apixaban   Procedures: As Listed in  Progress Note Above  Antibiotics: None  RN Pressure Injury Documentation:      Subjective: Pt continues to have sob, but states it is a little better.  Denies cp, f/c ,headache, n/v/d, abd pain  Objective: Vitals:   11/13/18 0400 11/13/18 0500 11/13/18 0600 11/13/18 0700  BP: 107/65 102/60 104/69 109/74  Pulse: 96 (!) 102 96 97  Resp: 13 20 14 17   Temp: (!) 97.4 F (36.3 C)     TempSrc: Oral     SpO2: 97% 97% 97% 98%  Weight:  98.1 kg    Height:        Intake/Output Summary (Last 24 hours) at 11/13/2018 0709 Last data filed at 11/13/2018 0700 Gross per 24 hour  Intake 2432.38 ml  Output 750 ml  Net 1682.38 ml   Weight change:  Exam:   General:  Pt is alert, follows commands appropriately, not in acute distress  HEENT: No icterus, No thrush, No neck mass, [place]/AT  Cardiovascular: RRR, S1/S2, no rubs, no gallops  Respiratory: bilateral crackles, no wheeze  Abdomen: Soft/+BS, non tender, non distended, no guarding  Extremities: 2 + LE edema, No lymphangitis, No petechiae, No rashes, no synovitis   Data Reviewed: I have personally reviewed following labs and imaging studies Basic Metabolic Panel: Recent Labs  Lab 11/26/2018 0908 11/13/18 0414  NA 140 141  K 4.5 3.4*  CL 85* 90*  CO2 37* 38*  GLUCOSE 130* 138*  BUN 49* 39*  CREATININE 1.30* 1.16*  CALCIUM 9.3 8.7*  MG 1.4*  --    Liver Function Tests: Recent Labs  Lab 11/21/2018 0908  AST 51*  ALT 37  ALKPHOS 150*  BILITOT 0.5  PROT 6.4*  ALBUMIN 2.8*   No results for input(s): LIPASE, AMYLASE in the last 168 hours. No results for input(s): AMMONIA in the last 168 hours. Coagulation Profile: Recent Labs  Lab 11/28/2018 0908  INR 1.3*   CBC: Recent Labs  Lab 11/13/2018 0908  WBC 12.9*  NEUTROABS 10.6*  HGB 11.8*  HCT 40.6  MCV 101.2*  PLT 357   Cardiac Enzymes: Recent Labs  Lab 11/11/2018 0908  TROPONINI 0.03*   BNP: Invalid input(s): POCBNP CBG: Recent Labs  Lab 12/03/2018 1209  11/29/2018 1647 11/14/2018 2107  GLUCAP 110* 103* 114*   HbA1C: No results for input(s): HGBA1C in the last 72 hours. Urine analysis:    Component Value Date/Time   COLORURINE YELLOW 11/22/2018 0721   APPEARANCEUR CLOUDY (A) 11/20/2018 0721   LABSPEC 1.014 11/23/2018 0721   PHURINE 7.0 12/08/2018 0721   GLUCOSEU NEGATIVE 11/21/2018 0721   HGBUR NEGATIVE 11/17/2018 0721   BILIRUBINUR NEGATIVE 11/23/2018 0721   KETONESUR NEGATIVE 11/22/2018 0721   PROTEINUR 30 (A) 11/28/2018 0721   UROBILINOGEN 0.2 06/20/2013 1832   NITRITE NEGATIVE 11/17/2018 0721   LEUKOCYTESUR LARGE (A) 11/14/2018 0721   Sepsis Labs: @LABRCNTIP (procalcitonin:4,lacticidven:4) ) Recent Results (from the past 240 hour(s))  SARS Coronavirus 2 (CEPHEID - Performed in [facility] hospital lab), Hosp Order     Status: None   Collection Time: 11/11/2018  7:21 AM  Result Value Ref Range Status   SARS Coronavirus 2 NEGATIVE NEGATIVE Final    Comment: (NOTE) If result is NEGATIVE SARS-CoV-2 target nucleic acids are NOT DETECTED. The SARS-CoV-2 RNA is generally detectable in upper and lower  respiratory specimens during the acute phase of infection. The lowest  concentration of SARS-CoV-2 viral copies this  assay can detect is 250  copies / mL. A negative result does not preclude SARS-CoV-2 infection  and should not be used as the sole basis for treatment or other  patient management decisions.  A negative result may occur with  improper specimen collection / handling, submission of specimen other  than nasopharyngeal swab, presence of viral mutation(s) within the  areas targeted by this assay, and inadequate number of viral copies  (<250 copies / mL). A negative result must be combined with clinical  observations, patient history, and epidemiological information. If result is POSITIVE SARS-CoV-2 target nucleic acids are DETECTED. The SARS-CoV-2 RNA is generally detectable in upper and lower  respiratory specimens  dur ing the acute phase of infection.  Positive  results are indicative of active infection with SARS-CoV-2.  Clinical  correlation with patient history and other diagnostic information is  necessary to determine patient infection status.  Positive results do  not rule out bacterial infection or co-infection with other viruses. If result is PRESUMPTIVE POSTIVE SARS-CoV-2 nucleic acids MAY BE PRESENT.   A presumptive positive result was obtained on the submitted specimen  and confirmed on repeat testing.  While 2019 novel coronavirus  (SARS-CoV-2) nucleic acids may be present in the submitted sample  additional confirmatory testing may be necessary for epidemiological  and / or clinical management purposes  to differentiate between  SARS-CoV-2 and other Sarbecovirus currently known to infect humans.  If clinically indicated additional testing with an alternate test  methodology [phone]) is advised. The SARS-CoV-2 RNA is generally  detectable in upper and lower respiratory sp ecimens during the acute  phase of infection. The expected result is Negative. Fact Sheet for Patients:  StrictlyIdeas.no Fact Sheet for Healthcare Providers: BankingDealers.co.za This test is not yet approved or cleared by the [place] FDA and has been authorized for detection and/or diagnosis of SARS-CoV-2 by FDA under an Emergency Use Authorization (EUA).  This EUA will remain in effect (meaning this test can be used) for the duration of the COVID-19 declaration under Section 564(b)(1) of the Act, 21 U.S.C. section 360bbb-3(b)(1), unless the authorization is terminated or revoked sooner. Performed at [facility], [address], [place], [place] [address]   MRSA PCR Screening     Status: None   Collection Time: 11/21/2018 11:45 AM  Result Value Ref Range Status   MRSA by PCR NEGATIVE NEGATIVE Final    Comment:        The GeneXpert MRSA Assay (FDA approved  for NASAL specimens only), is one component of a comprehensive MRSA colonization surveillance program. It is not intended to diagnose MRSA infection nor to guide or monitor treatment for MRSA infections. Performed at [facility], [address] [place], [place] [address]      Scheduled Meds:  apixaban  5 mg Oral BID   furosemide  40 mg Intravenous BID   insulin aspart  0-15 Units Subcutaneous TID WC   insulin aspart  0-5 Units Subcutaneous QHS   insulin glargine  10 Units Subcutaneous Daily   latanoprost  1 drop Both Eyes QHS   magnesium oxide   Oral BID   metoprolol tartrate  50 mg Oral BID   pantoprazole  40 mg Oral Daily   potassium chloride  40 mEq Per Tube Daily   sodium chloride flush  3 mL Intravenous Q12H   timolol  1 drop Both Eyes BID   Continuous Infusions:  sodium chloride     cefTRIAXone (ROCEPHIN)  IV Stopped (11/11/2018  1332)   diltiazem (CARDIZEM) infusion 5 mg/hr (11/13/18 0700)   feeding supplement (OSMOLITE 1.5 CAL) 1,000 mL (11/16/2018 1758)    Procedures/Studies: Dg Chest Port 1 View  Result Date: 12/02/2018 CLINICAL DATA:  Shortness of breath 3 days EXAM: PORTABLE CHEST 1 VIEW COMPARISON:  None. FINDINGS: Large right pleural effusion. Bilateral interstitial prominence. Trace left pleural effusion. No pneumothorax. Stable cardiomediastinal silhouette. Prior median sternotomy. No acute osseous abnormality. IMPRESSION: 1. Large right pleural effusion.  Trace left pleural effusion. Electronically Signed   By: Kathreen Devoid   On: 12/04/2018 08:18    Orson Eva, DO  [facility] Pager [phone]  If 7PM-7AM, please contact night-coverage [email or web address] Password [email or web address] 11/13/2018, 7:09 AM   LOS: 1 day

## 2018-11-13 NOTE — Consult Note (Signed)
Cardiology Consultation:   Patient ID: [name] MRN: [id number]; DOB: [birth year]  Admit date: 11/24/2018 Date of Consult: 11/13/2018  Primary Care Provider: [name], MD Primary Cardiologist: Adrian Prows, MD  Primary Electrophysiologist:  None    Patient Profile:   [name] is a 73 y.o. female with a hx of chronic diastolic heart failure and paroxysmal atrial fibrillation who is being seen today for the evaluation of acute on chronic diastolic heart failure and rapid atrial fibrillation at the request of Dr. Carles Collet.  History of Present Illness:   [name] is a 73 year old woman with a history of coronary disease and CABG in 1986, hypertension, hyperlipidemia, type 2 diabetes, morbid obesity, rheumatoid arthritis, paroxysmal atrial fibrillation, and history of TIA.  She has a history of abdominal abscess and underwent colectomy followed by colostomy for perforated sigmoid diverticulitis and was in rehab for almost 6 months in [place] and was discharged in early May.  She did a telehealth visit with Dr. Einar Gip on 10/22/2018.  He notes that she had lost 90 pounds but gained 30 pounds back.  She is a poor historian.  Upon review of the EMR, it appears she has had a 3-week history of progressive shortness of breath and weight gain.  She has had orthopnea and increasing bilateral leg edema.  She had been on torsemide 60 mg daily but had been on 40 mg daily for the past week.  She presented to the ED and was found to be in rapid atrial fibrillation with a heart rate in the 140 bpm range and was started on IV diltiazem.  She had some degree of CHF and was started on IV Lasix as well.  She thinks her breathing has improved.  She denies chest pain.  She said she does not have a good memory.  Past Medical History:  Diagnosis Date  . Abscess of sigmoid colon due to diverticulitis   . Anginal pain [facility])    sees Dr Einar Gip.   . Arthritis    rheumatoid ..   . CAD (coronary artery  disease), native coronary artery 10/22/2018  . Diabetes mellitus without complication (HCC)    Metformin 2.3.2017  . Diverticulitis   . Diverticulitis of large intestine with perforation and abscess 04/05/2018  . Fibromyalgia   . GERD (gastroesophageal reflux disease)   . High cholesterol   . Liver disease 08- 2012   per Dr Dagmar Hait pt has enlarged liver  . Peripheral vascular disease (HCC)    legs  . Pneumonia 06/17/2016  . Sleep apnea    sleep study  oct 2012  . Stroke [facility]) 2011   [facility]      Past Surgical History:  Procedure Laterality Date  . [place]  . CHOLECYSTECTOMY  1996  . COLONOSCOPY N/A 02/13/2018   Procedure: COLONOSCOPY;  Surgeon: Rogene Houston, MD;  [facility];  Service: Endoscopy;  Laterality: N/A;  8:30  . COLOSTOMY    . CORONARY ARTERY BYPASS GRAFT  1986  . ESOPHAGEAL DILATION N/A 01/13/2018   Procedure: ESOPHAGEAL DILATION;  Surgeon: Rogene Houston, MD;  [facility];  Service: Endoscopy;  Laterality: N/A;  . ESOPHAGOGASTRODUODENOSCOPY N/A 01/13/2018   Procedure: ESOPHAGOGASTRODUODENOSCOPY (EGD);  Surgeon: Rogene Houston, MD;  [facility];  Service: Endoscopy;  Laterality: N/A;  . EYE SURGERY  2013   cat ext bilateral .  . EYE SURGERY  2002   laser  .  FOREIGN BODY REMOVAL  02/13/2018   Procedure: FOREIGN BODY REMOVAL;  Surgeon: Rogene Houston, MD;  [facility];  Service: Endoscopy;;  foreign body removal which appears to be food debri in a stem form removed from colon by DR. Rehman  . [place]- 2007   rod right leg - right wrist plate  . GAS/FLUID EXCHANGE  03/25/2012   Procedure: GAS/FLUID EXCHANGE;  Surgeon: Hayden Pedro, MD;  Location: [place];  Service: Ophthalmology;  Laterality: Right;  . GASTROSTOMY W/ FEEDING TUBE    . ileoseomy  04/2018  . IR SINUS/FIST TUBE CHK-NON GI  06/20/2018  . LAPAROTOMY N/A 04/30/2018   Procedure: EXPLORATORY LAPAROTOMY   LYSIS OF ADHESIONS SMALL BOWEL RESECTION WITH ANASTOMSIS LOOP ILEOSTOMY PLACEMENT OF DRAINS AND WOUND [place];  Surgeon: Donnie Mesa, MD;  Location: [place];  Service: General;  Laterality: N/A;  . PARS PLANA VITRECTOMY  03/25/2012   Procedure: PARS PLANA VITRECTOMY WITH 25 GAUGE;  Surgeon: Hayden Pedro, MD;  Location: [place];  Service: Ophthalmology;  Laterality: Right;     Home Medications:  Prior to Admission medications   Medication Sig Start Date End Date Taking? Authorizing Provider  albuterol (ACCUNEB) 0.63 MG/3ML nebulizer solution Take 1 ampule by nebulization every 6 (six) hours as needed for wheezing or shortness of breath.  10/03/18  Yes [provider]  diltiazem (CARDIZEM CD) 120 MG 24 hr capsule Take 1 capsule (120 mg total) by mouth daily. 05/13/18 05/13/19 Yes Thurnell Lose, MD  ELIQUIS 5 MG TABS tablet Take 5 mg by mouth 2 (two) times a day.  10/03/18  Yes [provider]  GLIPIZIDE XL 10 MG 24 hr tablet Take 1 tablet by mouth daily. 09/06/18  Yes [provider]  HUMULIN 70/30 (70-30) 100 UNIT/ML injection Inject 10 Units into the skin 2 (two) times daily with a meal.  10/03/18  Yes [provider]  HYDROcodone-acetaminophen (NORCO) 10-325 MG tablet Take 1 tablet by mouth 4 times a day as needed 06/13/18  Yes Lassen, Arlo C, PA-C  hydroxychloroquine (PLAQUENIL) 200 MG tablet Take 200 mg by mouth daily.   Yes [provider]  LANTUS 100 UNIT/ML injection Inject 20 Units into the skin at bedtime.  10/03/18  Yes [provider]  latanoprost (XALATAN) 0.005 % ophthalmic solution Place 1 drop into both eyes at bedtime.    Yes [provider]  LORazepam (ATIVAN) 0.5 MG tablet Take 0.5 mg by mouth every 8 (eight) hours as needed.  10/03/18  Yes [provider]  Magnesium 200 MG TABS Take 2 tablets by mouth 2 (two) times a day.    Yes [provider]  megestrol (MEGACE) 40 MG tablet Take 80 mg by mouth daily.    Yes [provider]  omeprazole (PRILOSEC) 20 MG capsule Take 20 mg by mouth 2 (two) times daily before a meal.  10/03/18  Yes [provider]  potassium chloride SA (K-DUR,KLOR-CON) 20 MEQ tablet Take 15 mEq by mouth daily.    Yes [provider]  predniSONE (DELTASONE) 5 MG tablet Take 5 mg by mouth daily with breakfast.   Yes [provider]  promethazine (PHENERGAN) 25 MG tablet Take 25 mg by mouth every 4 (four) hours as needed for nausea.  10/03/18  Yes [provider]  Tafluprost (ZIOPTAN) 0.0015 % SOLN Place 1 drop into both eyes at bedtime.    Yes [provider]  timolol (BETIMOL) 0.5 %  ophthalmic solution Place 1 drop into both eyes 2 (two) times daily.    Yes [provider]  torsemide (DEMADEX) 20 MG tablet Take 20 mg by mouth 3 (three) times daily.  10/03/18  Yes [provider]  metoprolol tartrate (LOPRESSOR) 50 MG tablet Take 1 tablet by mouth 2 (two) times a day. 10/03/18   [provider]  Olmesartan-amLODIPine-HCTZ 40-5-25 MG TABS Take 1 tablet by mouth daily. 10/19/18   [provider]  Vitamin D, Ergocalciferol, (DRISDOL) 1.25 MG (50000 UT) CAPS capsule Take 50,000 Units by mouth every Thursday.     [provider]    Inpatient Medications: Scheduled Meds: . apixaban  5 mg Oral BID  . furosemide  40 mg Intravenous BID  . insulin aspart  0-15 Units Subcutaneous TID WC  . insulin aspart  0-5 Units Subcutaneous QHS  . insulin glargine  10 Units Subcutaneous Daily  . latanoprost  1 drop Both Eyes QHS  . magnesium oxide   Oral BID  . metoprolol tartrate  50 mg Oral BID  . pantoprazole  40 mg Oral Daily  . potassium chloride  40 mEq Per Tube Daily  . sodium chloride flush  3 mL Intravenous Q12H  . timolol  1 drop Both Eyes BID   Continuous Infusions: . sodium chloride    . cefTRIAXone (ROCEPHIN)  IV Stopped (11/19/2018 1332)  . diltiazem (CARDIZEM) infusion 5 mg/hr (11/13/18  0700)  . feeding supplement (OSMOLITE 1.5 CAL) 1,000 mL (11/20/2018 1758)   PRN Meds: sodium chloride, acetaminophen, ondansetron (ZOFRAN) IV, sodium chloride flush  Allergies:    Allergies  Allergen Reactions  . Lactose Intolerance (Gi) Other (See Comments)    G.I. Upset  . Butrans [Buprenorphine] Rash and Other (See Comments)    Infected skin underneath application  . Simvastatin Rash    Social History:   Social History   Socioeconomic History  . Marital status: Married    Spouse name: Herbie [place]  . Number of children: 2  . Years of education: 39  . Highest education level: Not on file  Occupational History  . Occupation: Retired  Scientific laboratory technician  . Financial resource strain: Not on file  . Food insecurity:    Worry: Not on file    Inability: Not on file  . Transportation needs:    Medical: Not on file    Non-medical: Not on file  Tobacco Use  . Smoking status: Former Smoker    Packs/day: 0.25    Years: 5.00    Pack years: 1.25    Types: Cigarettes    Last attempt to quit: 06/11/1984    Years since quitting: 34.4  . Smokeless tobacco: Never Used  Substance and Sexual Activity  . Alcohol use: Yes    Comment: occ  . Drug use: No    Types: Hydrocodone  . Sexual activity: Yes  Lifestyle  . Physical activity:    Days per week: Not on file    Minutes per session: Not on file  . Stress: Not on file  Relationships  . Social connections:    Talks on phone: Not on file    Gets together: Not on file    Attends religious service: Not on file    Active member of club or organization: Not on file    Attends meetings of clubs or organizations: Not on file    Relationship status: Not on file  . Intimate partner violence:    Fear of current or ex partner:  Not on file    Emotionally abused: Not on file    Physically abused: Not on file    Forced sexual activity: Not on file  Other Topics Concern  . Not on file  Social History Narrative  . Not on file    Family History:     Family History  Problem Relation Age of Onset  . Hypertension Daughter   . Rheum arthritis Maternal Grandmother      ROS:  Please see the history of present illness.   All other ROS reviewed and negative.     Physical Exam/Data:   Vitals:   11/13/18 0500 11/13/18 0600 11/13/18 0700 11/13/18 0800  BP: 102/60 104/69 109/74 (!) 115/99  Pulse: (!) 102 96 97 98  Resp: 20 14 17  (!) 24  Temp:      TempSrc:      SpO2: 97% 97% 98% 93%  Weight: 98.1 kg     Height:        Intake/Output Summary (Last 24 hours) at 11/13/2018 1001 Last data filed at 11/13/2018 0700 Gross per 24 hour  Intake 2432.38 ml  Output 750 ml  Net 1682.38 ml   Last 3 Weights 11/13/2018 11/23/2018 11/16/2018  Weight (lbs) 216 lb 4.3 oz 209 lb 10.5 oz 212 lb 4.9 oz  Weight (kg) 98.1 kg 95.1 kg 96.3 kg     Body mass index is 34.91 kg/m.  General:  Well nourished, well developed, in no acute distress HEENT: normal Lymph: no adenopathy Neck: no JVD Endocrine:  No thryomegaly Cardiac: Irregular rhythm, normal S1/S2, no S3, no murmurs, rubs, or gallops Lungs: Diminished breath sounds at bases Abd: soft, nontender, no hepatomegaly  Ext: 1+ pitting lower extremity edema bilaterally Musculoskeletal:  No deformities, BUE and BLE strength normal and equal Skin: warm and dry  Neuro:  CNs 2-12 intact, no focal abnormalities noted Psych:  Normal affect   EKG:  The EKG was personally reviewed and demonstrates: Rapid atrial fibrillation with underlying right bundle branch block Telemetry:  Telemetry was personally reviewed and demonstrates: Atrial fibrillation, heart rate 90 bpm range, PVCs  Relevant CV Studies:  Lexiscan Myoview stress test 07/03/2013: 1. Resting EKG NSR, Poor R wave progression. Low voltage complexes. Pulmonary disease pattern. Stress EKG was non diagnostic for ischemia. No ST-T changes of ischemia noted with pharmacologic stress testing. 2. The perfusion study demonstrated normal isotope uptake both at  rest and stress. There was no evidence of ischemia or scar. Dynamic gated images reveal normal wall motion and endocardial thickening. Left ventricular ejection fraction was estimated to be 61% No significant change from [place]: 02/16/11: Non diagnostic EKG. Very small apical ischemia vs gut artifact. Normal EF. Low.  Echo 06/22/2013: - Left ventricle: The cavity size was normal. There was moderate concentric hypertrophy. Systolic function was vigorous. The estimated ejection fraction was in the range of 65% to 70%. Wall motion was normal; there were no regional wall motion abnormalities. Left ventricular diastolic function parameters were normal. Mitral valve: Calcified annulus. Left atrium: The atrium was mildly to moderately dilated. Right ventricle: The cavity size was moderately dilated. Right atrium: The atrium was mildly dilated. Impression: EKG 07/17/2017: Normal sinus rhythm at rate of 73 bpm, normal axis, frequent PVCs in the form of trigeminy. Low-voltage complexes. No evidence of ischemia. No significant change from EKG 07/22/2015, frequent PVC new.  Laboratory Data:  Chemistry Recent Labs  Lab 12/01/2018 0908 11/13/18 0414  NA 140 141  K 4.5 3.4*  CL 85*  90*  CO2 37* 38*  GLUCOSE 130* 138*  BUN 49* 39*  CREATININE 1.30* 1.16*  CALCIUM 9.3 8.7*  GFRNONAA 40* 46*  GFRAA 47* 54*  ANIONGAP 18* 13    Recent Labs  Lab 12/08/2018 0908  PROT 6.4*  ALBUMIN 2.8*  AST 51*  ALT 37  ALKPHOS 150*  BILITOT 0.5   Hematology Recent Labs  Lab 11/24/2018 0908 11/13/18 0414  WBC 12.9* 13.1*  RBC 4.01 3.76*  HGB 11.8* 10.9*  HCT 40.6 37.2  MCV 101.2* 98.9  MCH 29.4 29.0  MCHC 29.1* 29.3*  RDW 19.3* 19.3*  PLT 357 354   Cardiac Enzymes Recent Labs  Lab 12/09/2018 0908  TROPONINI 0.03*   No results for input(s): TROPIPOC in the last 168 hours.  BNP Recent Labs  Lab 11/13/2018 0908  BNP 244.0*    DDimer No results for input(s): DDIMER in the last 168 hours.   Radiology/Studies:  Dg Chest Port 1 View  Result Date: 11/24/2018 CLINICAL DATA:  Shortness of breath 3 days EXAM: PORTABLE CHEST 1 VIEW COMPARISON:  None. FINDINGS: Large right pleural effusion. Bilateral interstitial prominence. Trace left pleural effusion. No pneumothorax. Stable cardiomediastinal silhouette. Prior median sternotomy. No acute osseous abnormality. IMPRESSION: 1. Large right pleural effusion.  Trace left pleural effusion. Electronically Signed   By: Kathreen Devoid   On: 12/05/2018 08:18    Assessment and Plan:   1.  Acute on chronic diastolic heart failure: Uncertain about I/O accuracy as it would indicate she is positive nearly 1.7 L.  Continue IV Lasix 40 mg twice daily.  BNP 244.  2.  Rapid atrial fibrillation: Heart rate currently in 90 bpm range on IV diltiazem 5 mg/h.  She normally takes long-acting diltiazem 120 mg daily at home along with Lopressor 50 mg twice daily.  I will stop IV diltiazem and transition to oral diltiazem 30 mg every 6 hours and monitor blood pressure concurrently.  An echocardiogram has been ordered and is pending.  3.  Coronary disease with history of CABG: Currently on Lopressor 50 mg twice daily.  No aspirin as she is on apixaban.  She reportedly developed a rash with simvastatin.  I will defer to her primary cardiologist regarding statin therapy.  Troponin nonspecifically elevated at 0.03 and indicative of demand ischemia in the context of rapid atrial fibrillation and decompensated diastolic heart failure and chronic kidney disease stage III.  An echocardiogram has been ordered and is pending.  4.  History of TIA: Continue Eliquis 5 mg twice daily.  5.  Hypertension: Most recent blood pressure 115/99.  It had been low yesterday.  6.  UTI: Urine culture positive for gram-negative rods.  She is on antimicrobial therapy.  Leukocytosis with white blood cells of 13.1.  7.  Chronic kidney disease stage III: Creatinine 1.3 yesterday, down to 1.16 today.         For questions or updates, please contact [place] Please consult [email or web address] for contact info under     Signed, Kate Sable, MD  11/13/2018 10:01 AM

## 2018-11-14 ENCOUNTER — Inpatient Hospital Stay (HOSPITAL_COMMUNITY): Payer: Medicare Other

## 2018-11-14 DIAGNOSIS — I959 Hypotension, unspecified: Secondary | ICD-10-CM

## 2018-11-14 DIAGNOSIS — I25708 Atherosclerosis of coronary artery bypass graft(s), unspecified, with other forms of angina pectoris: Secondary | ICD-10-CM

## 2018-11-14 DIAGNOSIS — L8990 Pressure ulcer of unspecified site, unspecified stage: Secondary | ICD-10-CM

## 2018-11-14 DIAGNOSIS — J90 Pleural effusion, not elsewhere classified: Secondary | ICD-10-CM

## 2018-11-14 DIAGNOSIS — I469 Cardiac arrest, cause unspecified: Secondary | ICD-10-CM

## 2018-11-14 LAB — MAGNESIUM
Magnesium: 1.9 mg/dL (ref 1.7–2.4)
Magnesium: 2.2 mg/dL (ref 1.7–2.4)

## 2018-11-14 LAB — BASIC METABOLIC PANEL
Anion gap: 14 (ref 5–15)
BUN: 43 mg/dL — ABNORMAL HIGH (ref 8–23)
CO2: 35 mmol/L — ABNORMAL HIGH (ref 22–32)
Calcium: 9 mg/dL (ref 8.9–10.3)
Chloride: 92 mmol/L — ABNORMAL LOW (ref 98–111)
Creatinine, Ser: 1.17 mg/dL — ABNORMAL HIGH (ref 0.44–1.00)
GFR calc Af Amer: 53 mL/min — ABNORMAL LOW (ref >60)
GFR calc non Af Amer: 46 mL/min — ABNORMAL LOW (ref >60)
Glucose, Bld: 194 mg/dL — ABNORMAL HIGH (ref 70–99)
Potassium: 4.6 mmol/L (ref 3.5–5.1)
Sodium: 141 mmol/L (ref 135–145)

## 2018-11-14 LAB — COMPREHENSIVE METABOLIC PANEL
ALT: 63 U/L — ABNORMAL HIGH (ref 0–44)
AST: 116 U/L — ABNORMAL HIGH (ref 15–41)
Albumin: 2.6 g/dL — ABNORMAL LOW (ref 3.5–5.0)
Alkaline Phosphatase: 164 U/L — ABNORMAL HIGH (ref 38–126)
Anion gap: 16 — ABNORMAL HIGH (ref 5–15)
BUN: 50 mg/dL — ABNORMAL HIGH (ref 8–23)
CO2: 34 mmol/L — ABNORMAL HIGH (ref 22–32)
Calcium: 9 mg/dL (ref 8.9–10.3)
Chloride: 92 mmol/L — ABNORMAL LOW (ref 98–111)
Creatinine, Ser: 1.5 mg/dL — ABNORMAL HIGH (ref 0.44–1.00)
GFR calc Af Amer: 39 mL/min — ABNORMAL LOW (ref >60)
GFR calc non Af Amer: 34 mL/min — ABNORMAL LOW (ref >60)
Glucose, Bld: 214 mg/dL — ABNORMAL HIGH (ref 70–99)
Potassium: 5.6 mmol/L — ABNORMAL HIGH (ref 3.5–5.1)
Sodium: 142 mmol/L (ref 135–145)
Total Bilirubin: 0.4 mg/dL (ref 0.3–1.2)
Total Protein: 6.5 g/dL (ref 6.5–8.1)

## 2018-11-14 LAB — CBC
HCT: 37.2 % (ref 36.0–46.0)
Hemoglobin: 10.8 g/dL — ABNORMAL LOW (ref 12.0–15.0)
MCH: 30.3 pg (ref 26.0–34.0)
MCHC: 29 g/dL — ABNORMAL LOW (ref 30.0–36.0)
MCV: 104.2 fL — ABNORMAL HIGH (ref 80.0–100.0)
Platelets: 314 10*3/uL (ref 150–400)
RBC: 3.57 MIL/uL — ABNORMAL LOW (ref 3.87–5.11)
RDW: 18.6 % — ABNORMAL HIGH (ref 11.5–15.5)
WBC: 18.7 10*3/uL — ABNORMAL HIGH (ref 4.0–10.5)
nRBC: 0 % (ref 0.0–0.2)

## 2018-11-14 LAB — PHOSPHORUS: Phosphorus: 6.9 mg/dL — ABNORMAL HIGH (ref 2.5–4.6)

## 2018-11-14 LAB — BLOOD GAS, ARTERIAL
Acid-Base Excess: 9.7 mmol/L — ABNORMAL HIGH (ref 0.0–2.0)
Bicarbonate: 30.6 mmol/L — ABNORMAL HIGH (ref 20.0–28.0)
FIO2: 100
O2 Saturation: 90.9 %
Patient temperature: 36.6
pCO2 arterial: 100 mmHg (ref 32.0–48.0)
pH, Arterial: 7.2 — ABNORMAL LOW (ref 7.350–7.450)
pO2, Arterial: 74.4 mmHg — ABNORMAL LOW (ref 83.0–108.0)

## 2018-11-14 LAB — GLUCOSE, CAPILLARY
Glucose-Capillary: 186 mg/dL — ABNORMAL HIGH (ref 70–99)
Glucose-Capillary: 150 mg/dL — ABNORMAL HIGH (ref 70–99)
Glucose-Capillary: 173 mg/dL — ABNORMAL HIGH (ref 70–99)

## 2018-11-14 LAB — URINE CULTURE: Culture: >=100000 — AB

## 2018-11-14 MED ORDER — DILTIAZEM HCL 30 MG PO TABS
30.0000 mg | ORAL_TABLET | Freq: Once | ORAL | Status: AC
  Administered 2018-11-14: 30 mg via ORAL
  Filled 2018-11-14: qty 1

## 2018-11-14 MED ORDER — SODIUM ZIRCONIUM CYCLOSILICATE 10 G PO PACK: 10.0000 g | PACK | Freq: Once | ORAL | Status: DC

## 2018-11-14 MED ORDER — MORPHINE 100MG IN NS 100ML (1MG/ML) PREMIX INFUSION
2.0000 mg/h | INTRAVENOUS | Status: DC
  Administered 2018-11-14: 2 mg/h via INTRAVENOUS
  Filled 2018-11-14 (×2): qty 100

## 2018-11-14 MED ORDER — HYDROCODONE-ACETAMINOPHEN 10-325 MG PO TABS
1.0000 | ORAL_TABLET | Freq: Four times a day (QID) | ORAL | Status: DC | PRN
  Administered 2018-11-14: 2 via ORAL
  Filled 2018-11-14: qty 2

## 2018-11-14 MED ORDER — INSULIN GLARGINE 100 UNIT/ML ~~LOC~~ SOLN
14.0000 [IU] | Freq: Every day | SUBCUTANEOUS | Status: DC
  Filled 2018-11-14 (×3): qty 0.14

## 2018-11-14 MED ORDER — CALCIUM GLUCONATE-NACL 1-0.675 GM/50ML-% IV SOLN: 1.0000 g | Freq: Once | INTRAVENOUS | Status: DC

## 2018-11-14 MED ORDER — SODIUM BICARBONATE 8.4 % IV SOLN: 50.0000 meq | Freq: Once | INTRAVENOUS | Status: DC

## 2018-11-14 MED ORDER — HYDROCODONE-ACETAMINOPHEN 10-325 MG PO TABS
1.0000 | ORAL_TABLET | Freq: Once | ORAL | Status: AC
  Administered 2018-11-14: 1 via ORAL
  Filled 2018-11-14: qty 1

## 2018-11-14 MED ORDER — NOREPINEPHRINE 4 MG/250ML-% IV SOLN
0.0000 ug/min | INTRAVENOUS | Status: DC
  Administered 2018-11-14: 2 ug/min via INTRAVENOUS

## 2018-11-14 MED ORDER — DILTIAZEM HCL 60 MG PO TABS
60.0000 mg | ORAL_TABLET | Freq: Four times a day (QID) | ORAL | Status: DC
  Administered 2018-11-14: 11:00:00 60 mg via ORAL
  Filled 2018-11-14: qty 1

## 2018-11-14 MED ORDER — SODIUM CHLORIDE 0.9 % IV SOLN
1.0000 g | Freq: Three times a day (TID) | INTRAVENOUS | Status: DC
  Administered 2018-11-14: 1 g via INTRAVENOUS
  Filled 2018-11-14: qty 1

## 2018-11-14 NOTE — Progress Notes (Signed)
Progress Note  Patient Name: [name] Date of Encounter: 11/14/2018  Primary Cardiologist: Adrian Prows, MD   Subjective   She is feeling better with respect to breathing and leg swelling. Denies chest pain.  Inpatient Medications    Scheduled Meds: . apixaban  5 mg Oral BID  . diltiazem  30 mg Oral Q6H  . feeding supplement (PRO-STAT SUGAR FREE 64)  30 mL Per Tube BID  . free water  200 mL Per Tube Q8H  . furosemide  40 mg Intravenous BID  . insulin aspart  0-15 Units Subcutaneous TID WC  . insulin aspart  0-5 Units Subcutaneous QHS  . insulin glargine  10 Units Subcutaneous Daily  . latanoprost  1 drop Both Eyes QHS  . magnesium oxide   Oral BID  . metoprolol tartrate  50 mg Oral BID  . pantoprazole  40 mg Oral Daily  . potassium chloride  40 mEq Per Tube Daily  . sodium chloride flush  3 mL Intravenous Q12H  . timolol  1 drop Both Eyes BID   Continuous Infusions: . sodium chloride    . cefTRIAXone (ROCEPHIN)  IV Stopped (11/13/18 1137)  . feeding supplement (OSMOLITE 1.5 CAL) 1,000 mL (11/13/18 1453)   PRN Meds: sodium chloride, acetaminophen, alum & mag hydroxide-simeth, ondansetron (ZOFRAN) IV, sodium chloride flush   Vital Signs    Vitals:   11/14/18 0500 11/14/18 0534 11/14/18 0600 11/14/18 0800  BP: (!) 123/94 (!) 123/94 (!) 143/91 (!) 148/98  Pulse: (!) 142  (!) 136 (!) 119  Resp: (!) 23  16 19   Temp:    98 F (36.7 C)  TempSrc:    Oral  SpO2: 94%  93% 91%  Weight: 96 kg     Height:        Intake/Output Summary (Last 24 hours) at 11/14/2018 0852 Last data filed at 11/14/2018 0700 Gross per 24 hour  Intake 106 ml  Output 500 ml  Net -394 ml   Filed Weights   11/13/2018 1154 11/13/18 0500 11/14/18 0500  Weight: 95.1 kg 98.1 kg 96 kg    Telemetry    Rapid atrial fib, 110-130 bpm - Personally Reviewed   Physical Exam   GEN: No acute distress.   Neck: No JVD Cardiac: Tachycardic, irregular, no murmurs, rubs, or gallops.  Respiratory:  Diminished BS at right base GI: Soft, nontender, non-distended  MS: Trace b/l LE edema; No deformity. Neuro:  Nonfocal  Psych: Normal affect   Labs    Chemistry Recent Labs  Lab 11/10/2018 0908 11/13/18 0414 11/14/18 0414  NA 140 141 141  K 4.5 3.4* 4.6  CL 85* 90* 92*  CO2 37* 38* 35*  GLUCOSE 130* 138* 194*  BUN 49* 39* 43*  CREATININE 1.30* 1.16* 1.17*  CALCIUM 9.3 8.7* 9.0  PROT 6.4*  --   --   ALBUMIN 2.8*  --   --   AST 51*  --   --   ALT 37  --   --   ALKPHOS 150*  --   --   BILITOT 0.5  --   --   GFRNONAA 40* 46* 46*  GFRAA 47* 54* 53*  ANIONGAP 18* 13 14     Hematology Recent Labs  Lab 12/06/2018 0908 11/13/18 0414  WBC 12.9* 13.1*  RBC 4.01 3.76*  HGB 11.8* 10.9*  HCT 40.6 37.2  MCV 101.2* 98.9  MCH 29.4 29.0  MCHC 29.1* 29.3*  RDW 19.3* 19.3*  PLT 357  354    Cardiac Enzymes Recent Labs  Lab 12/07/2018 0908  TROPONINI 0.03*   No results for input(s): TROPIPOC in the last 168 hours.   BNP Recent Labs  Lab 11/11/2018 0908  BNP 244.0*     DDimer No results for input(s): DDIMER in the last 168 hours.   Radiology    No results found.  Cardiac Studies   Echo 11/13/18:  1. The left ventricle has normal systolic function with an ejection fraction of 60-65%. The cavity size was normal. Mild posterior wall thickening. Left ventricular diastolic function could not be evaluated secondary to atrial fibrillation.  2. The right ventricle was not well visualized. The cavity was mildly enlarged. There is not assessed.  3. Left atrial size was moderately dilated.  4. The mitral valve is grossly normal. There is mild mitral annular calcification present.  5. The aortic valve is tricuspid.  6. The aortic root is normal in size and structure.  7. The interatrial septum was not assessed.  Patient Profile     73 y.o. female with a hx of chronic diastolic heart failure and paroxysmal atrial fibrillation who is being seen today for the evaluation of acute on  chronic diastolic heart failure and rapid atrial fibrillation at the request of Dr. Carles Collet.  Assessment & Plan    1.  Acute on chronic diastolic heart failure: Uncertain about I/O accuracy as it would indicate she is only negative 489 cc.  Continue IV Lasix 40 mg twice daily.  BNP 244.  2.  Rapid atrial fibrillation: Tachycardic today.  She normally takes long-acting diltiazem 120 mg daily at home along with Lopressor 50 mg twice daily. I started oral diltiazem 30 mg every 6 hours on 6/4 as she was only requiring IV 5 mg/hr to control HR. As she is also hypertensive, I will increase diltiazem to 60 mg q 6 hrs. If HR remains elevated over the weekend, consider giving IV amiodarone bolus and infusion for 24 hrs. Lopressor can also be increased as needed.  3.  Coronary disease with history of CABG: Currently on Lopressor 50 mg twice daily.  No aspirin as she is on apixaban.  She reportedly developed a rash with simvastatin.  I will defer to her primary cardiologist regarding statin therapy.  Troponin nonspecifically elevated at 0.03 and indicative of demand ischemia in the context of rapid atrial fibrillation and decompensated diastolic heart failure and chronic kidney disease stage III.    4.  History of TIA: Continue Eliquis 5 mg twice daily.  5.  Hypertension: Most recent blood pressure 148/98.   6.  UTI: Urine culture positive for gram-negative rods.  She is on antimicrobial therapy.  Leukocytosis with white blood cells of 13.1 on 6/4.  7.  Chronic kidney disease stage III: Creatinine 1.17 today.  For questions or updates, please contact [place] Please consult [email or web address] for contact info under Cardiology/STEMI.      Signed, Kate Sable, MD  11/14/2018, 8:52 AM

## 2018-11-14 NOTE — Progress Notes (Addendum)
Pt transported to CT at this time.

## 2018-11-14 NOTE — Progress Notes (Signed)
[facility] Per RN has chronic pain, pt requesting Norco.   A/P RA, chronic pain Anxiety Norco 10/325mg  po x1 Defer to palliative, day physician regarding which pain medication to use as well as whether to restart lorazepam for anxiety

## 2018-11-14 NOTE — Care Management Important Message (Signed)
Important Message  Patient Details  Name: [name] MRN: [id number] Date of Birth: 03-16-1946   Medicare Important Message Given:  Yes    Tommy Medal 11/14/2018, 1:41 PM

## 2018-11-14 NOTE — Progress Notes (Signed)
Pt is alert at this time on ventilator, she can follow commands. Has attempted to grab at ETT, mitts applied. Pt nodding to yes or no questions.

## 2018-11-14 NOTE — Progress Notes (Signed)
MD Tat notified, verbal order given to titrate Levophed for map of 90.

## 2018-11-14 NOTE — Progress Notes (Signed)
PROGRESS NOTE  [name] ZOX:[id number] DOB: [birth year] DOA: 12/09/2018 PCP: [name], MD  Brief History:  73 y.o.femalewith medical historyof coronary artery disease, hyperlipidemia, stroke, hypertension, diabetes mellitus, rheumatoid arthritis, fibromyalgia/chronic pain presenting with3-week history of shortness of breath. The patient has been having associated increasing lower extremity edema and orthopnea. Unfortunately, the patient is a difficult historian.  The patient was recently discharged from the nursing home in [place] approximately 6 weeks prior to this admission. She was taking 60 mg of torsemide daily. However in the past week, the patient had decrease it to 40 mg daily for unclear reasons. The patient denies any chest pain, fever, chills, nausea, vomiting, diarrhea, abdominal pain, dysuria, hematuria. The patient does not frequently weigh herself at home. However, the patient was noted to have a cardiology visit at Dr. Einar Gip on 5/13/20where she weighed 220 pounds. The patient was recently admitted to the hospital from 04/05/2018 through 05/13/2018 due to perforated sigmoid diverticulitis with abscess. The patient hadan exploratory laparotomy with lysis of adhesions and small bowel resection with loop ileostomy on 04/30/2018 performed byDr. [name].She was also admitted from 06/13/2018 through 06/17/2018 for new onset atrial fibrillation with RVR. After that admission, the patient went to [facility] rehab.  She subsequently developed pneumonia and required intubation.  She then spent a little over a month at [facility] where a PEG was placed.  Subsequently, she was discharged to [facility] in [place].   In the emergency department, the patient was afebrile hemodynamically stable albeit with soft blood pressures. She was noted to have atrial fibrillation with RVR with heart rate in the 140s. The patient was started on diltiazem drip. She was started on IV furosemide.  Chest x-ray showed large left pleural effusion with bilateral interstitial prominence. BMP showed a serum creatinine 1.30. WBC was 12.9. Urinalysis showed greater than 50 WBC.  Assessment/Plan:  Acute diastolic CHF -Continue IV furosemide 40 mg IV twice daily -I/Os--not accurate -daily weights--Neg 8.4 lbs since admission  Acute on chronic respiratory failure with hypoxia -normally on 2.5 L at home -currently stable on 5L -wean oxygen for sat >92%  Paroxysmal Atrial Fibrillation with RVR -continue apixaban -continue metoprolol as BP allows -continue diltiazem drip>>transitioned to po diltiazem 6/4 -increase po dilitazem to 60 mg q 6 -consult cardiology--appreciated  Acute toxic/metabolic Encephalopathy -RN reports pt was more alert at beginning of shift on 6/5 -Norco given at 0912 -pt now confused, somnolent but arousable -discontinue Norco for now -if need to restart-->reduce dose -CT brain  CKD stage 3 -baseline creatinine 1.0-1.3  Diabetes mellitus type 2, uncontrolled with hyperglycemia -10/13/2018 hemoglobin A1c 8.3 -Increase Lantus to 14 units -NovoLog sliding scale -Holding glipizide  ESBL EColi UTI -discontinue ceftriaxone  -start Merrem  Essential hypertension -Blood pressure is soft on diltiazem drip -Continue metoprolol succinate -Holding Isordil due to soft BPs  FEN -continue Osmolite enteral feeding--rate increased to 45 cc/hr per nutrition recommendations  Coronary artery disease -No chest pain presently -Continuemetoprolol   Rheumatoid arthritis -Morrie Sheldon on hold(previously on this x 3-4 yrs) -pt follows Dr. Amil Amen in outpatient setting  Hypomagnesemia -repleted -continue po mag in addition to IV  Hypokalemia -repleted  Chronic pain syndrome/Fibromyalgia -hold hydrocodone for now due to new AMS  History of stroke and TIA -Continueapixaban  Stage 2 pressure injury--buttock -present on admission -local wound care   Goals of Care -daughter, patient and spouse appear to have varying goals -consult palliative to clarify and continue  conversation-->remain full code     Disposition Plan:   Home in 2-3 days  Family Communication:   Family at bedside  Consultants:  Cardiology, pallative  Code Status:  FULL   DVT Prophylaxis:  apixaban   Procedures: As Listed in Progress Note Above  Antibiotics: None  RN Pressure Injury Documentation:       Subjective: Pt is somnolent but arousable.  She is confused, only intermittently answering questions and following commands.  No sob, cp, vomiting.  Remaining ROS unobtainable due to AMS  Objective: Vitals:   11/14/18 0800 11/14/18 1015 11/14/18 1100 11/14/18 1139  BP: (!) 148/98 131/82  121/74  Pulse: (!) 119 (!) 118  (!) 122  Resp: 19 18  20   Temp: 98 F (36.7 C)  98 F (36.7 C)   TempSrc: Oral  Oral   SpO2: 91% 94%  94%  Weight:      Height:        Intake/Output Summary (Last 24 hours) at 11/14/2018 1229 Last data filed at 11/14/2018 0845 Gross per 24 hour  Intake 160 ml  Output 500 ml  Net -340 ml   Weight change: -3.791 kg Exam:   General:  Pt is somnolent but arousable and intermittently following commands. not in acute distress  HEENT: No icterus, No thrush, No neck mass, [place]/AT  Cardiovascular: IRRR, S1/S2, no rubs, no gallops  Respiratory: bibasilar crackles, diminshed BS on right  Abdomen: Soft/+BS, non tender, non distended, no guarding  Extremities: 1+ LE edema, No lymphangitis, No petechiae, No rashes, no synovitis   Data Reviewed: I have personally reviewed following labs and imaging studies Basic Metabolic Panel: Recent Labs  Lab 11/25/2018 0908 11/13/18 0414 11/14/18 0414  NA 140 141 141  K 4.5 3.4* 4.6  CL 85* 90* 92*  CO2 37* 38* 35*  GLUCOSE 130* 138* 194*  BUN 49* 39* 43*  CREATININE 1.30* 1.16* 1.17*  CALCIUM 9.3 8.7* 9.0  MG 1.4* 1.8 1.9   Liver Function Tests: Recent Labs  Lab 12/03/2018  0908  AST 51*  ALT 37  ALKPHOS 150*  BILITOT 0.5  PROT 6.4*  ALBUMIN 2.8*   No results for input(s): LIPASE, AMYLASE in the last 168 hours. No results for input(s): AMMONIA in the last 168 hours. Coagulation Profile: Recent Labs  Lab 11/14/2018 0908  INR 1.3*   CBC: Recent Labs  Lab 11/23/2018 0908 11/13/18 0414  WBC 12.9* 13.1*  NEUTROABS 10.6*  --   HGB 11.8* 10.9*  HCT 40.6 37.2  MCV 101.2* 98.9  PLT 357 354   Cardiac Enzymes: Recent Labs  Lab 11/24/2018 0908  TROPONINI 0.03*   BNP: Invalid input(s): POCBNP CBG: Recent Labs  Lab 11/13/18 1137 11/13/18 1638 11/13/18 2131 11/14/18 0730 11/14/18 1102  GLUCAP 140* 160* 123* 186* 150*   HbA1C: No results for input(s): HGBA1C in the last 72 hours. Urine analysis:    Component Value Date/Time   COLORURINE YELLOW 12/02/2018 0721   APPEARANCEUR CLOUDY (A) 11/21/2018 0721   LABSPEC 1.014 11/19/2018 0721   PHURINE 7.0 12/05/2018 0721   GLUCOSEU NEGATIVE 11/28/2018 0721   HGBUR NEGATIVE 11/20/2018 0721   BILIRUBINUR NEGATIVE 11/17/2018 0721   KETONESUR NEGATIVE 11/13/2018 0721   PROTEINUR 30 (A) 11/16/2018 0721   UROBILINOGEN 0.2 06/20/2013 1832   NITRITE NEGATIVE 11/14/2018 0721   LEUKOCYTESUR LARGE (A) 11/24/2018 0721   Sepsis Labs: @LABRCNTIP (procalcitonin:4,lacticidven:4) ) Recent Results (from the past 240 hour(s))  Urine culture     Status: Abnormal  Collection Time: 11/24/2018  7:21 AM  Result Value Ref Range Status   Specimen Description   Final    URINE, CLEAN CATCH Performed at [facility], [address], [place], [place] [address]    Special Requests   Final    NONE Performed at [facility], [address], [place], [place] [address]    Culture (A)  Final    >=100,000 COLONIES/mL ESCHERICHIA COLI >=100,000 COLONIES/mL PROTEUS MIRABILIS Confirmed Extended Spectrum Beta-Lactamase Producer (ESBL).  In bloodstream infections from ESBL organisms, carbapenems are preferred over  piperacillin/tazobactam. They are shown to have a lower risk of mortality.    Report Status 11/14/2018 FINAL  Final   Organism ID, Bacteria ESCHERICHIA COLI (A)  Final   Organism ID, Bacteria PROTEUS MIRABILIS (A)  Final      Susceptibility   Escherichia coli - MIC*    AMPICILLIN >=32 RESISTANT Resistant     CEFAZOLIN >=64 RESISTANT Resistant     CEFTRIAXONE >=64 RESISTANT Resistant     CIPROFLOXACIN >=4 RESISTANT Resistant     GENTAMICIN >=16 RESISTANT Resistant     IMIPENEM <=0.25 SENSITIVE Sensitive     NITROFURANTOIN 256 RESISTANT Resistant     TRIMETH/SULFA >=320 RESISTANT Resistant     AMPICILLIN/SULBACTAM >=32 RESISTANT Resistant     PIP/TAZO 8 SENSITIVE Sensitive     Extended ESBL POSITIVE Resistant     * >=100,000 COLONIES/mL ESCHERICHIA COLI   Proteus mirabilis - MIC*    AMPICILLIN <=2 SENSITIVE Sensitive     CEFAZOLIN 8 SENSITIVE Sensitive     CEFTRIAXONE <=1 SENSITIVE Sensitive     CIPROFLOXACIN >=4 RESISTANT Resistant     GENTAMICIN <=1 SENSITIVE Sensitive     IMIPENEM 4 SENSITIVE Sensitive     NITROFURANTOIN 128 RESISTANT Resistant     TRIMETH/SULFA >=320 RESISTANT Resistant     AMPICILLIN/SULBACTAM <=2 SENSITIVE Sensitive     PIP/TAZO <=4 SENSITIVE Sensitive     * >=100,000 COLONIES/mL PROTEUS MIRABILIS  SARS Coronavirus 2 (CEPHEID - Performed in [place] hospital lab), Hosp Order     Status: None   Collection Time: 11/14/2018  7:21 AM  Result Value Ref Range Status   SARS Coronavirus 2 NEGATIVE NEGATIVE Final    Comment: (NOTE) If result is NEGATIVE SARS-CoV-2 target nucleic acids are NOT DETECTED. The SARS-CoV-2 RNA is generally detectable in upper and lower  respiratory specimens during the acute phase of infection. The lowest  concentration of SARS-CoV-2 viral copies this assay can detect is 250  copies / mL. A negative result does not preclude SARS-CoV-2 infection  and should not be used as the sole basis for treatment or other  patient management  decisions.  A negative result may occur with  improper specimen collection / handling, submission of specimen other  than nasopharyngeal swab, presence of viral mutation(s) within the  areas targeted by this assay, and inadequate number of viral copies  (<250 copies / mL). A negative result must be combined with clinical  observations, patient history, and epidemiological information. If result is POSITIVE SARS-CoV-2 target nucleic acids are DETECTED. The SARS-CoV-2 RNA is generally detectable in upper and lower  respiratory specimens dur ing the acute phase of infection.  Positive  results are indicative of active infection with SARS-CoV-2.  Clinical  correlation with patient history and other diagnostic information is  necessary to determine patient infection status.  Positive results do  not rule out bacterial infection or co-infection with other viruses. If result is PRESUMPTIVE POSTIVE SARS-CoV-2 nucleic  acids MAY BE PRESENT.   A presumptive positive result was obtained on the submitted specimen  and confirmed on repeat testing.  While 2019 novel coronavirus  (SARS-CoV-2) nucleic acids may be present in the submitted sample  additional confirmatory testing may be necessary for epidemiological  and / or clinical management purposes  to differentiate between  SARS-CoV-2 and other Sarbecovirus currently known to infect humans.  If clinically indicated additional testing with an alternate test  methodology [phone]) is advised. The SARS-CoV-2 RNA is generally  detectable in upper and lower respiratory sp ecimens during the acute  phase of infection. The expected result is Negative. Fact Sheet for Patients:  StrictlyIdeas.no Fact Sheet for Healthcare Providers: BankingDealers.co.za This test is not yet approved or cleared by the [place] FDA and has been authorized for detection and/or diagnosis of SARS-CoV-2 by FDA under an  Emergency Use Authorization (EUA).  This EUA will remain in effect (meaning this test can be used) for the duration of the COVID-19 declaration under Section 564(b)(1) of the Act, 21 U.S.C. section 360bbb-3(b)(1), unless the authorization is terminated or revoked sooner. Performed at [facility], [address], [place], [place] [address]   MRSA PCR Screening     Status: None   Collection Time: 11/13/2018 11:45 AM  Result Value Ref Range Status   MRSA by PCR NEGATIVE NEGATIVE Final    Comment:        The GeneXpert MRSA Assay (FDA approved for NASAL specimens only), is one component of a comprehensive MRSA colonization surveillance program. It is not intended to diagnose MRSA infection nor to guide or monitor treatment for MRSA infections. Performed at [facility], [address], [place], [place] [address]      Scheduled Meds: . apixaban  5 mg Oral BID  . diltiazem  60 mg Oral Q6H  . feeding supplement (PRO-STAT SUGAR FREE 64)  30 mL Per Tube BID  . free water  200 mL Per Tube Q8H  . furosemide  40 mg Intravenous BID  . insulin aspart  0-15 Units Subcutaneous TID WC  . insulin aspart  0-5 Units Subcutaneous QHS  . [START ON 12-05-2018] insulin glargine  14 Units Subcutaneous Daily  . latanoprost  1 drop Both Eyes QHS  . magnesium oxide   Oral BID  . metoprolol tartrate  50 mg Oral BID  . pantoprazole  40 mg Oral Daily  . potassium chloride  40 mEq Per Tube Daily  . sodium chloride flush  3 mL Intravenous Q12H  . timolol  1 drop Both Eyes BID   Continuous Infusions: . sodium chloride 250 mL (11/14/18 1104)  . feeding supplement (OSMOLITE 1.5 CAL) 1,000 mL (11/13/18 1453)  . meropenem (MERREM) IV      Procedures/Studies: Dg Chest Port 1 View  Result Date: 11/23/2018 CLINICAL DATA:  Shortness of breath 3 days EXAM: PORTABLE CHEST 1 VIEW COMPARISON:  None. FINDINGS: Large right pleural effusion. Bilateral interstitial prominence. Trace left pleural effusion. No pneumothorax.  Stable cardiomediastinal silhouette. Prior median sternotomy. No acute osseous abnormality. IMPRESSION: 1. Large right pleural effusion.  Trace left pleural effusion. Electronically Signed   By: Kathreen Devoid   On: 11/14/2018 08:18    Orson Eva, DO  [facility] Pager [phone]  If 7PM-7AM, please contact night-coverage [email or web address] Password [email or web address] 11/14/2018, 12:29 PM   LOS: 2 days

## 2018-11-14 NOTE — Progress Notes (Addendum)
After spouse had chance to visit patient, he wanted to speak with me again. We discussed goals of care and other advanced directives. I again updated spouse on the course of events. I explained that her overall prognosis remains poor in the setting of her multiple co-morbidities.  I explained that in the best of circumstances, even if the patient were to survive, she would not likely return to her pre-morbid state.  He stated that he has been struggling with the decision to make her DNR.  Ultimately, he explained that she has been having pain and suffering for the better part of the last 8-9 months with multiple hospitalizations and continued functional decline.  He expressed that he understood that her quality of life is poor and that "she would not want to live like this." He did not want the patient to have any more pain and suffering and expressed to me that he wanted to transition the patient's care to focus solely on her comfort and stopping further testing and blood draws. All medications not focused on patient's comfort will be discontinued.  Morphine drip to be intiated  DTat  Total time spent 80 minutes in addition to time spent this morning.  The patient is critically ill with multiple organ systems failure and requires high complexity decision making for assessment and support, frequent evaluation and titration of therapies, application of advanced monitoring technologies and extensive interpretation of multiple databases.  Critical care time 60 min

## 2018-11-14 NOTE — ED Provider Notes (Signed)
I was called to the patient's room as a CODE BLUE.  Patient recently admitted for shortness of breath and paroxysmal atrial fibrillation.  Patient was having a CT scan performed of her head due to altered level of consciousness.  Shortly after returning, she became apneic and went into the PEA.  CPR was initiated and a CODE BLUE was called.  I arrived to the room to find the patient being ventilated with a bag valve mask and CPR in progress.  The patient was unresponsive initially with a GCS of 3.  Patient had received 1 dose of epinephrine and received a second shortly after my arrival.  I was unable to palpate pulses and patient had no blood pressure.  At that point, CPR was continued and the decision was made to proceed with intubation.  A 7.5 endotracheal tube was easily placed using the glide scope.  Tube placement was confirmed with direct visualization, end-tidal CO2, and auscultation over the chest and abdomen.  No medications were given during intubation and patient had minimal gag reflex.  After several minutes of CPR, the patient did regain pulses and spontaneous circulation.  She was hypotensive and peripheral Levophed was started.  Shortly afterward, her blood pressure was 120/100.  At this point, care was handed off to Dr. Carles Collet who was present in the patient's room during resuscitative efforts.  He will discuss further measures with the family.  CRITICAL CARE Performed by: Veryl Speak Total critical care time: 45 minutes Critical care time was exclusive of separately billable procedures and treating other patients. Critical care was necessary to treat or prevent imminent or life-threatening deterioration. Critical care was time spent personally by me on the following activities: development of treatment plan with patient and/or surrogate as well as nursing, discussions with consultants, evaluation of patient's response to treatment, examination of patient, obtaining history from patient or  surrogate, ordering and performing treatments and interventions, ordering and review of laboratory studies, ordering and review of radiographic studies, pulse oximetry and re-evaluation of patient's condition.  INTUBATION Performed by: Veryl Speak  Required items: required blood products, implants, devices, and special equipment available Patient identity confirmed: provided demographic data and hospital-assigned identification number Time out: Immediately prior to procedure a "time out" was called to verify the correct patient, procedure, equipment, support staff and site/side marked as required.  Indications: Respiratory failure  Intubation method: Glidescope Laryngoscopy   Preoxygenation: BVM  Sedatives: none Paralytic: none  Tube Size: 7.5 cuffed  Post-procedure assessment: chest rise and ETCO2 monitor Breath sounds: equal and absent over the epigastrium Tube secured with: ETT holder Chest x-ray interpreted by radiologist and me.  Chest x-ray findings: endotracheal tube in appropriate position  Patient tolerated the procedure well with no immediate complications.       Veryl Speak, MD 11/14/18 Pauline Aus

## 2018-11-14 NOTE — Progress Notes (Signed)
Patient is to be extubated to comfort care when family has visited. Family still with patient nurse will notify for extubation.

## 2018-11-14 NOTE — Progress Notes (Signed)
Per MD Tat pt is to be made comfort care at this time. Family to visit pt and then extubation ordered.

## 2018-11-14 NOTE — Progress Notes (Signed)
Upon pt's return from CT pt is noted to be moaning, non responsive to verbal or painful stimuli. Pt is guppy breathing, lips and face are cyanotic, pulse oximetry is reading in the 40s and pt's [place] is attached to any oxygen source. This RN began to bag pt with no improvement in O2 saturation. MD Tat notified as well as respiratory. Pt was bradycardic in the 30s and upon pulse check pt had no pulse. CPR started.

## 2018-11-14 NOTE — Progress Notes (Signed)
Pt being assisted with BVM and CPR began d/t PEA. MD Tat at bedside, code blue called. Upon pulse check at 1508 pt in PEA. 1 mg Epi given and CPR started again. ER physician MD Delo at bedside assisting with code.  At 1510 pulse check pt remains in PEA, CPR restarted. Pt remains in PEA at 1512 and 1mg  Epi given. PEA noted at 1514 pulse check. At 1515 1MG  epi given d/t PEA. Upon 1516 pt's pulse returned as a ST rhythm. 1 amp of Bicarb given at 1516.

## 2018-11-14 NOTE — Consult Note (Signed)
[facility] Consult  Reason for Consult: Central line consult  Referring Physician:  Dr. Carles Collet   Chief Complaint    Shortness of Breath      [name] is a 73 y.o. female.  HPI: Patient is a 74 yo with CAD, CABG in 1986, HLD, HTN, TIA, DM type 2, RA, chronic pain from fibromyalgia who was admitted on 6/3 with shortness of breath. She has had multiple prolonged hospitalization in the past year.  She has had a PEG placed and has been in an LTACH and has been home for about 6 weeks. She had a cardiac arrest today after desatting and then becoming bradycardic. She had ROSC and was on levophed. I was asked to come place a central line in the patient.  The patient's family was in the waiting room, and I have discussed the central line placement with them and their options. We discussed the risk of central line placement including bleeding, infection, pneumothorax, and inability to place due to scarring from her prior lines. We discussed that they have the option to not have the central line placed.   On discussing with Dr. Carles Collet the patient and family discussed goals of care yesterday and had opted for doing everything. The husband now expresses great distress in proceeding and says that "she has been in pain for 6 months."   Past Medical History:  Diagnosis Date  . Abscess of sigmoid colon due to diverticulitis   . Anginal pain [facility])    sees Dr Einar Gip.   . Arthritis    rheumatoid ..   . CAD (coronary artery disease), native coronary artery 10/22/2018  . Diabetes mellitus without complication (HCC)    Metformin 2.3.2017  . Diverticulitis   . Diverticulitis of large intestine with perforation and abscess 04/05/2018  . Fibromyalgia   . GERD (gastroesophageal reflux disease)   . High cholesterol   . Liver disease 08- 2012   per Dr Dagmar Hait pt has enlarged liver  . Peripheral vascular disease (HCC)    legs  . Pneumonia 06/17/2016  . Sleep apnea    sleep study  oct 2012  .  Stroke [facility]) 2011   [facility]      Past Surgical History:  Procedure Laterality Date  . [place]  . CHOLECYSTECTOMY  1996  . COLONOSCOPY N/A 02/13/2018   Procedure: COLONOSCOPY;  Surgeon: Rogene [place], MD;  [facility];  Service: Endoscopy;  Laterality: N/A;  8:30  . COLOSTOMY    . CORONARY ARTERY BYPASS GRAFT  1986  . ESOPHAGEAL DILATION N/A 01/13/2018   Procedure: ESOPHAGEAL DILATION;  Surgeon: Rogene [place], MD;  [facility];  Service: Endoscopy;  Laterality: N/A;  . ESOPHAGOGASTRODUODENOSCOPY N/A 01/13/2018   Procedure: ESOPHAGOGASTRODUODENOSCOPY (EGD);  Surgeon: Rogene [place], MD;  [facility];  Service: Endoscopy;  Laterality: N/A;  . EYE SURGERY  2013   cat ext bilateral .  . EYE SURGERY  2002   laser  . FOREIGN BODY REMOVAL  02/13/2018   Procedure: FOREIGN BODY REMOVAL;  Surgeon: Rogene [place], MD;  [facility];  Service: Endoscopy;;  foreign body removal which appears to be food debri in a stem form removed from colon by DR. Rehman  . [place]- 2007   rod right leg - right wrist plate  . GAS/FLUID EXCHANGE  03/25/2012   Procedure: GAS/FLUID EXCHANGE;  Surgeon: Hayden Pedro, MD;  Location: [place] OR;  Service: Ophthalmology;  Laterality: Right;  . GASTROSTOMY W/ FEEDING TUBE    . ileoseomy  04/2018  . IR SINUS/FIST TUBE CHK-NON GI  06/20/2018  . LAPAROTOMY N/A 04/30/2018   Procedure: EXPLORATORY LAPAROTOMY  LYSIS OF ADHESIONS SMALL BOWEL RESECTION WITH ANASTOMSIS LOOP ILEOSTOMY PLACEMENT OF DRAINS AND WOUND [place];  Surgeon: Donnie Mesa, MD;  Location: [employer];  Service: General;  Laterality: N/A;  . PARS PLANA VITRECTOMY  03/25/2012   Procedure: PARS PLANA VITRECTOMY WITH 25 GAUGE;  Surgeon: Hayden Pedro, MD;  Location: [place];  Service: Ophthalmology;  Laterality: Right;    Family History  Problem Relation Age of Onset  . Hypertension Daughter   . Rheum arthritis Maternal  Grandmother     Social History   Tobacco Use  . Smoking status: Former Smoker    Packs/day: 0.25    Years: 5.00    Pack years: 1.25    Types: Cigarettes    Last attempt to quit: 06/11/1984    Years since quitting: 34.4  . Smokeless tobacco: Never Used  Substance Use Topics  . Alcohol use: Yes    Comment: occ  . Drug use: No    Types: Hydrocodone    Medications:  I have reviewed the patient's current medications. Prior to Admission:  Medications Prior to Admission  Medication Sig Dispense Refill Last Dose  . albuterol (ACCUNEB) 0.63 MG/3ML nebulizer solution Take 1 ampule by nebulization every 6 (six) hours as needed for wheezing or shortness of breath.    11/11/2018 at Unknown time  . diltiazem (CARDIZEM CD) 120 MG 24 hr capsule Take 1 capsule (120 mg total) by mouth daily. 30 capsule 0 11/11/2018 at Unknown time  . ELIQUIS 5 MG TABS tablet Take 5 mg by mouth 2 (two) times a day.    11/11/2018 at 2100  . GLIPIZIDE XL 10 MG 24 hr tablet Take 1 tablet by mouth daily.   11/11/2018 at Unknown time  . HUMULIN 70/30 (70-30) 100 UNIT/ML injection Inject 10 Units into the skin 2 (two) times daily with a meal.    11/11/2018 at 0730  . HYDROcodone-acetaminophen (NORCO) 10-325 MG tablet Take 1 tablet by mouth 4 times a day as needed 10 tablet 0   . hydroxychloroquine (PLAQUENIL) 200 MG tablet Take 200 mg by mouth daily.   11/11/2018 at Unknown time  . LANTUS 100 UNIT/ML injection Inject 20 Units into the skin at bedtime.      Marland Kitchen latanoprost (XALATAN) 0.005 % ophthalmic solution Place 1 drop into both eyes at bedtime.    Past Week at Unknown time  . LORazepam (ATIVAN) 0.5 MG tablet Take 0.5 mg by mouth every 8 (eight) hours as needed.    11/11/2018 at Unknown time  . Magnesium 200 MG TABS Take 2 tablets by mouth 2 (two) times a day.    11/11/2018 at Unknown time  . megestrol (MEGACE) 40 MG tablet Take 80 mg by mouth daily.   11/11/2018 at Unknown time  . omeprazole (PRILOSEC) 20 MG capsule Take 20 mg by mouth 2  (two) times daily before a meal.    11/11/2018 at Unknown time  . potassium chloride SA (K-DUR,KLOR-CON) 20 MEQ tablet Take 15 mEq by mouth daily.    11/11/2018 at Unknown time  . predniSONE (DELTASONE) 5 MG tablet Take 5 mg by mouth daily with breakfast.   11/11/2018 at Unknown time  . promethazine (PHENERGAN) 25 MG tablet Take 25 mg by mouth every 4 (  four) hours as needed for nausea.    Taking  . Tafluprost (ZIOPTAN) 0.0015 % SOLN Place 1 drop into both eyes at bedtime.    11/11/2018 at Unknown time  . timolol (BETIMOL) 0.5 % ophthalmic solution Place 1 drop into both eyes 2 (two) times daily.    11/11/2018 at Unknown time  . torsemide (DEMADEX) 20 MG tablet Take 20 mg by mouth 3 (three) times daily.    11/11/2018 at Unknown time  . metoprolol tartrate (LOPRESSOR) 50 MG tablet Take 1 tablet by mouth 2 (two) times a day.   Not Taking at Unknown time  . Olmesartan-amLODIPine-HCTZ 40-5-25 MG TABS Take 1 tablet by mouth daily.   Not Taking at Unknown time  . Vitamin D, Ergocalciferol, (DRISDOL) 1.25 MG (50000 UT) CAPS capsule Take 50,000 Units by mouth every Thursday.    Not Taking at Unknown time   Scheduled: . apixaban  5 mg Oral BID  . diltiazem  60 mg Oral Q6H  . feeding supplement (PRO-STAT SUGAR FREE 64)  30 mL Per Tube BID  . free water  200 mL Per Tube Q8H  . furosemide  40 mg Intravenous BID  . insulin aspart  0-15 Units Subcutaneous TID WC  . insulin aspart  0-5 Units Subcutaneous QHS  . [START ON 12-13-18] insulin glargine  14 Units Subcutaneous Daily  . latanoprost  1 drop Both Eyes QHS  . magnesium oxide   Oral BID  . metoprolol tartrate  50 mg Oral BID  . pantoprazole  40 mg Oral Daily  . sodium chloride flush  3 mL Intravenous Q12H  . timolol  1 drop Both Eyes BID   Continuous: . sodium chloride 250 mL (11/14/18 1409)  . feeding supplement (OSMOLITE 1.5 CAL) 1,000 mL (11/14/18 1409)  . meropenem (MERREM) IV 1 g (11/14/18 1410)   RSW:NIOEVO chloride, acetaminophen, alum & mag  hydroxide-simeth, ondansetron (ZOFRAN) IV, sodium chloride flush  Allergies  Allergen Reactions  . Lactose Intolerance (Gi) Other (See Comments)    G.I. Upset  . Butrans [Buprenorphine] Rash and Other (See Comments)    Infected skin underneath application  . Simvastatin Rash     ROS:  Review of systems not obtained due to patient factors. intubated patient   Blood pressure (!) 93/58, pulse 85, temperature 98 F (36.7 C), temperature source Oral, resp. rate 15, height 5\' 6"  (1.676 m), weight 96 kg, SpO2 96 %. Physical Exam Vitals signs reviewed.  Constitutional:      Comments: Following commands  HENT:     Head: Normocephalic.  Eyes:     Pupils: Pupils are equal, round, and reactive to light.  Cardiovascular:     Rate and Rhythm: Regular rhythm.  Pulmonary:     Effort: Pulmonary effort is normal.  Abdominal:     Palpations: Abdomen is soft.     Tenderness: There is no abdominal tenderness.  Skin:    General: Skin is warm and dry.  Neurological:     General: No focal deficit present.     Mental Status: She is alert.     Comments: Following commands, open eyes, moving extremities, GCS 11T  Psychiatric:        Behavior: Behavior normal.     Results: Results for orders placed or performed during the hospital encounter of 11/24/2018 (from the past 48 hour(s))  Glucose, capillary     Status: Abnormal   Collection Time: 12/06/2018  4:47 PM  Result Value Ref Range   Glucose-Capillary 103 (  H) 70 - 99 mg/dL  Glucose, capillary     Status: Abnormal   Collection Time: 11/22/2018  9:07 PM  Result Value Ref Range   Glucose-Capillary 114 (H) 70 - 99 mg/dL  Basic metabolic panel     Status: Abnormal   Collection Time: 11/13/18  4:14 AM  Result Value Ref Range   Sodium 141 135 - 145 mmol/L   Potassium 3.4 (L) 3.5 - 5.1 mmol/L    Comment: DELTA CHECK NOTED   Chloride 90 (L) 98 - 111 mmol/L   CO2 38 (H) 22 - 32 mmol/L   Glucose, Bld 138 (H) 70 - 99 mg/dL   BUN 39 (H) 8 - 23 mg/dL    Creatinine, Ser 1.16 (H) 0.44 - 1.00 mg/dL   Calcium 8.7 (L) 8.9 - 10.3 mg/dL   GFR calc non Af Amer 46 (L) >60 mL/min   GFR calc Af Amer 54 (L) >60 mL/min   Anion gap 13 5 - 15    Comment: Performed at [facility], [address] [place], [place] [address]  Magnesium     Status: None   Collection Time: 11/13/18  4:14 AM  Result Value Ref Range   Magnesium 1.8 1.7 - 2.4 mg/dL    Comment: Performed at [facility], [address], [place] [address]  CBC     Status: Abnormal   Collection Time: 11/13/18  4:14 AM  Result Value Ref Range   WBC 13.1 (H) 4.0 - 10.5 K/uL   RBC 3.76 (L) 3.87 - 5.11 MIL/uL   Hemoglobin 10.9 (L) 12.0 - 15.0 g/dL   HCT 37.2 36.0 - 46.0 %   MCV 98.9 80.0 - 100.0 fL   MCH 29.0 26.0 - 34.0 pg   MCHC 29.3 (L) 30.0 - 36.0 g/dL   RDW 19.3 (H) 11.5 - 15.5 %   Platelets 354 150 - 400 K/uL   nRBC 0.0 0.0 - 0.2 %    Comment: Performed at [facility], [address], [place], [place] [address]  Glucose, capillary     Status: Abnormal   Collection Time: 11/13/18  7:28 AM  Result Value Ref Range   Glucose-Capillary 150 (H) 70 - 99 mg/dL  Glucose, capillary     Status: Abnormal   Collection Time: 11/13/18 11:37 AM  Result Value Ref Range   Glucose-Capillary 140 (H) 70 - 99 mg/dL  Glucose, capillary     Status: Abnormal   Collection Time: 11/13/18  4:38 PM  Result Value Ref Range   Glucose-Capillary 160 (H) 70 - 99 mg/dL  Glucose, capillary     Status: Abnormal   Collection Time: 11/13/18  9:31 PM  Result Value Ref Range   Glucose-Capillary 123 (H) 70 - 99 mg/dL  Basic metabolic panel     Status: Abnormal   Collection Time: 11/14/18  4:14 AM  Result Value Ref Range   Sodium 141 135 - 145 mmol/L   Potassium 4.6 3.5 - 5.1 mmol/L    Comment: DELTA CHECK NOTED   Chloride 92 (L) 98 - 111 mmol/L   CO2 35 (H) 22 - 32 mmol/L   Glucose, Bld 194 (H) 70 - 99 mg/dL   BUN 43 (H) 8 - 23 mg/dL   Creatinine, Ser 1.17 (H) 0.44 - 1.00 mg/dL   Calcium 9.0 8.9 -  10.3 mg/dL   GFR calc non Af Amer 46 (L) >60 mL/min   GFR calc Af Amer 53 (L) >60 mL/min   Anion gap 14  5 - 15    Comment: Performed at [facility], [address] [place], [place] [address]  Magnesium     Status: None   Collection Time: 11/14/18  4:14 AM  Result Value Ref Range   Magnesium 1.9 1.7 - 2.4 mg/dL    Comment: Performed at [facility], [address], [place],  [address]  Glucose, capillary     Status: Abnormal   Collection Time: 11/14/18  7:30 AM  Result Value Ref Range   Glucose-Capillary 186 (H) 70 - 99 mg/dL  Glucose, capillary     Status: Abnormal   Collection Time: 11/14/18 11:02 AM  Result Value Ref Range   Glucose-Capillary 150 (H) 70 - 99 mg/dL  Glucose, capillary     Status: Abnormal   Collection Time: 11/14/18  3:05 PM  Result Value Ref Range   Glucose-Capillary 173 (H) 70 - 99 mg/dL  Blood gas, arterial     Status: Abnormal   Collection Time: 11/14/18  3:50 PM  Result Value Ref Range   FIO2 100.00    pH, Arterial 7.200 (L) 7.350 - 7.450   pCO2 arterial 100 (HH) 32.0 - 48.0 mmHg    Comment: CRITICAL RESULT CALLED TO, READ BACK BY AND VERIFIED WITH: MORGAN F. @ 1604 ON [id number] BY HENDERSON L.    pO2, Arterial 74.4 (L) 83.0 - 108.0 mmHg   Bicarbonate 30.6 (H) 20.0 - 28.0 mmol/L   Acid-Base Excess 9.7 (H) 0.0 - 2.0 mmol/L   O2 Saturation 90.9 %   Patient temperature 36.6    Allens test (pass/fail) BRACHIAL ARTERY (A) PASS    Comment: Performed at [facility], [address], [place],  [address]    Ct Head Wo Contrast  Result Date: 11/14/2018 CLINICAL DATA:  Query toxic/metabolic encephalopathy. EXAM: CT HEAD WITHOUT CONTRAST TECHNIQUE: Contiguous axial images were obtained from the base of the skull through the vertex without intravenous contrast. COMPARISON:  CT head 06/08/2018. FINDINGS: Significant motion degradation. Study is of marginal diagnostic utility. Brain: No visible acute stroke, hydrocephalus, hemorrhage, mass lesion, or  extra-axial fluid. Generalized atrophy. White matter hypoattenuation, likely small vessel disease. Vascular: No hyperdense vessel or unexpected calcification. Skull: Normal. Negative for fracture or focal lesion. Sinuses/Orbits: No acute finding. Other: Similar appearance to priors. IMPRESSION: Motion degraded exam demonstrating no definite acute intracranial findings. MRI is not recommended for further evaluation in this patient, until mental status improves, given the inability to remain motionless with CT. Electronically Signed   By: Staci Righter M.D.   On: 11/14/2018 15:08    Assessment & Plan:  [name] is a 73 y.o. female with hypotension after a cardiac arrest. I was asked to place a central line. The family is now saying that do not want this and do not want the patient to be in further pain. The patient who is following commands and shaking her head yes/no to questions is also indicating she does not want the line. She is off and on levophed at 5.    -No placement of central line at this time  -Dr. Carles Collet further discussing resuscitation for the patient and option of comfort care. The family does not want a central line.   Greater than 50% of the 30 minute visit was spent in counseling/ coordination of care regarding if they want to have the central line, and attempting to coordinate care with Dr. Carles Collet.    Virl Cagey 11/14/2018, 4:36 PM

## 2018-11-14 NOTE — Progress Notes (Signed)
Responded to nursing call:  Code Blue   Subjective: Pt was confused but awake and conversant at time of my evaluation at 1230 and when she went down for CT brain.  She was somnolent but she was arousable and following one step commands when she went to CT.   When she returned the patient was cyanotic and unresponsive with her [facility] oxygen off per RN.  She was bradycardic in the 30-40.  No pulse was palpated.  Code Blue was activated and ACLS protocol was initiated. Dr. Stark Jock was present to assist with ACLS protocol.  ROSC returned.    Vitals:   11/14/18 1015 11/14/18 1100 11/14/18 1139 11/14/18 1400  BP: 131/82  121/74 104/81  Pulse: (!) 118  (!) 122 (!) 104  Resp: 18  20 20   Temp:  98 F (36.7 C)    TempSrc:  Oral    SpO2: 94%  94% 92%  Weight:      Height:       CV--IRRR Lung--bibasilar rales Abd--soft+BS/ND   Assessment/Plan: Cardiopulmonary arrest -likely precipitated by hypoventilation from opioids resulting in hypercarbia and hypoxia -family updated -CMP, CBC, Mag, phos -Stat ABG -Stat CXR -consulted Dr. Constance Haw for central line placement, but family now does not want that after speaking with Dr. Constance Haw -EKG--personally reviewed--afib with RBBB, unchanged -case discussed with cardiology, Dr. Ezequiel Ganser not trend troponins  DTat     Orson Eva, DO [facility]

## 2018-11-17 MED FILL — Medication: Qty: 1 | Status: AC

## 2018-12-10 NOTE — Progress Notes (Signed)
Pt's spouse and son at bedside. Updated family, no needs expressed at this time. Pt is breathing with mouth wide open, occasional moaning, non responsive at this time. Monitor changed to comfort care.

## 2018-12-10 NOTE — Progress Notes (Signed)
Pt asystole and apneic. TOD 1000. E link RN notified at this time to notify [facility]. DM Tat paged.

## 2018-12-10 NOTE — Progress Notes (Signed)
Patient extubated placed on 5 lpm /[place] Patient alert has trouble talking, eyes open family in room.

## 2018-12-10 NOTE — Progress Notes (Signed)
PROGRESS NOTE  [name] Treat VQM:[id number] DOB: February 03, 1946 DOA: 11/13/2018 PCP: [name], MD  Brief History:  73 y.o.femalewith medical historyof coronary artery disease, hyperlipidemia, stroke, hypertension, diabetes mellitus, rheumatoid arthritis, fibromyalgia/chronic pain presenting with3-week history of shortness of breath. The patient has been having associated increasing lower extremity edema and orthopnea. Unfortunately, the patient is adifficulthistorian.  The patient was recently discharged from the nursing home in [place] approximately 6 weeks prior to this admission. She was taking 60 mg of torsemide daily. However in the past week, the patient had decrease it to 40 mg daily for unclear reasons. The patient denies any chest pain, fever, chills, nausea, vomiting, diarrhea, abdominal pain, dysuria, hematuria. The patient does not frequently weigh herself at home. However, the patient was noted to have a cardiology visit at Dr. Einar Gip on 5/13/20where she weighed 220 pounds. The patient was recently admitted to the hospital from 04/05/2018 through 05/13/2018 due to perforated sigmoid diverticulitis with abscess. The patient hadan exploratory laparotomy with lysis of adhesions and small bowel resection with loop ileostomy on 04/30/2018 performed byDr. [name].She was also admitted from 06/13/2018 through 06/17/2018 for new onset atrial fibrillation with RVR. After that admission, the patient went to [facility] rehab. She subsequently developed pneumonia and required intubation. She then spent a little over a month at [facility] where a PEG was placed. Subsequently, she was discharged to [facility] in [place].   In the emergency department, the patient was afebrile hemodynamically stable albeit with soft blood pressures. She was noted to have atrial fibrillation with RVR with heart rate in the 140s. The patient was started on diltiazem drip. She was started on IV furosemide.  Chest x-ray showed large left pleural effusion with bilateral interstitial prominence. BMP showed a serum creatinine 1.30. WBC was 12.9. Urinalysis showed greater than 50 WBC.  Cardiology was consulted initially to assist with management.  In the morning of 11/14/2018, the patient was complaining of her usual back and leg pain.  Her home hydrocodone was restarted.  The patient subsequently developed some somnolence and confusion, but was arousable and still followed commands.  CT of the brain was ordered.  When the patient came back from CT, she was noted to be bradycardic and cyanotic. "  CODE BLUE" was called, and ACLS protocol was initiated.  ROSC was obtained.  Subsequently, extensive goals of care discussion was undertaken with the patient's family, particularly with the patient's husband who was primary caregiver. I explained that her overall prognosis remains poor in the setting of her multiple co-morbidities.  I explained that in the best of circumstances, even if the patient were to survive, she would not likely return to her pre-morbid state.  He stated that he has been struggling with the decision to make her DNR.  Ultimately, he explained that she has been having pain and suffering for the better part of the last 8-9 months with multiple hospitalizations and continued functional decline.  He expressed that he understood that her quality of life is poor and that "she would not want to live like this." He did not want the patient to have any more pain and suffering and expressed to me that he wanted to transition the patient's care to focus solely on her comfort and stopping further testing and blood draws.  All medications not focused on patient's comfort will be discontinued.  Morphine drip was initiated and compassionate extubation was performed.  The patient remained comfortable  without any signs of pain or distress.   Assessment/Plan: Acute diastolic CHF -initially started IV furosemide 40  mg IV twice daily -I/Os--not accurate -daily weights--Neg 8.4 lbs since admission -pt has been transitioned to focus on comfort care after discussion of goals of care with spouse -as such, all labs and xrays were discontinued and all medications not focused on her comfort were stopped  Acute on chronic respiratory failure with hypoxia -normally on 2.5 L at home -currently stable on 5L -wean oxygen for sat >92% -pt was intubated after cardiopulmonary arrest -compassionate extubation performed after discussion of goals of care with spouse -pt has been transitioned to focus on comfort care after discussion of goals of care with spouse -as such, all labs and xrays were discontinued and all medications not focused on her comfort were stopped  Paroxysmal Atrial Fibrillation with RVR -continue apixaban -continue metoprolol as BP allows -continue diltiazem drip>>transitioned to po diltiazem 6/4 -increase po dilitazem to 60 mg q 6 -consult cardiology--appreciated -pt has been transitioned to focus on comfort care after discussion of goals of care with spouse -as such, all labs and xrays were discontinued and all medications not focused on her comfort were stopped  Acute toxic/metabolic Encephalopathy -RN reports pt was more alert at beginning of shift on 6/5 -Norco given at 0912 -pt confused, somnolent but arousable and followed commands -discontinued Norco for now -if need to restart-->reduce dose -CT brain--neg -pt has been transitioned to focus on comfort care after discussion of goals of care with spouse -as such, all labs and xrays were discontinued and all medications not focused on her comfort were stopped  CKD stage 3 -baseline creatinine 1.0-1.3 -pt has been transitioned to focus on comfort care after discussion of goals of care with spouse -as such, all labs and xrays were discontinued and all medications not focused on her comfort were stopped   Diabetes mellitus type  2, uncontrolled with hyperglycemia -10/13/2018 hemoglobin A1c 8.3 -Increase Lantus to 14 units -NovoLog sliding scale -Holding glipizide -pt has been transitioned to focus on comfort care after discussion of goals of care with spouse -as such, all labs and xrays were discontinued and all medications not focused on her comfort were stopped   ESBL EColi UTI -discontinue ceftriaxone  -started Merrem initially  Essential hypertension -Blood pressure is soft on diltiazem drip -Continue metoprolol succinate -Holding Isordildue to soft BPs  FEN -continue Osmolite enteral feeding--rate increased to 45 cc/hr per nutrition recommendations -pt has been transitioned to focus on comfort care after discussion of goals of care with spouse -as such, all labs and xrays were discontinued and all medications not focused on her comfort were stopped  Coronary artery disease -No chest pain presently -Continuemetoprolol initially -pt has been transitioned to focus on comfort care after discussion of goals of care with spouse -as such, all labs and xrays were discontinued and all medications not focused on her comfort were stopped  Rheumatoid arthritis -Morrie Sheldon on hold(previouslyon this x 3-4 yrs) -pt follows Dr. Amil Amen in outpatient setting  Hypomagnesemia -repleted  Hypokalemia -repleted  Chronic pain syndrome/Fibromyalgia -held hydrocodone for now due to new AMS  History of stroke and TIA -Continueapixaban  Stage 2 pressure injury--buttock -present on admission -local wound care  Goals of Care -FULL comfort confirmed with spouse at bedside -anticipatory care was discussed with spouse -spouse comforted and updated         Disposition Plan:  -In-hospital death Family Communication:   spouse at bedside updated 6/6  Consultants:  none  Code Status:  FULL Comfort  DVT Prophylaxis:  Full comfort   Procedures: As Listed in Progress Note  Above      Subjective: Pt comfortable.  She has shallow breathing.  No response to protopathic stimuli.  No reports of vomiting or diarrhea or uncontrolled pain  Objective: Vitals:   11/18/18 0600 Nov 18, 2018 0615 2018-11-18 0645 2018/11/18 0800  BP: 112/70 108/69 124/78   Pulse:  (!) 147 (!) 138   Resp: 19 18 (!) 21 17  Temp:      TempSrc:      SpO2:  (!) 89% (!) 88%   Weight:      Height:        Intake/Output Summary (Last 24 hours) at 11/18/18 0839 Last data filed at 11-18-2018 0735 Gross per 24 hour  Intake 212.08 ml  Output 550 ml  Net -337.92 ml   Weight change: -1.7 kg Exam:   General:  Pt resting comfortably  HEENT: No icterus, NCAT  Cardiovascular: IRRR  Respiratory: poor inspiratory effort. No wheeze  Abdomen: Soft/+BS  Extremities: 1+  LE edema   Data Reviewed: I have personally reviewed following labs and imaging studies Basic Metabolic Panel: Recent Labs  Lab 11/18/2018 0908 11/13/18 0414 11/14/18 0414 11/14/18 1604  NA 140 141 141 142  K 4.5 3.4* 4.6 5.6*  CL 85* 90* 92* 92*  CO2 37* 38* 35* 34*  GLUCOSE 130* 138* 194* 214*  BUN 49* 39* 43* 50*  CREATININE 1.30* 1.16* 1.17* 1.50*  CALCIUM 9.3 8.7* 9.0 9.0  MG 1.4* 1.8 1.9 2.2  PHOS  --   --   --  6.9*   Liver Function Tests: Recent Labs  Lab 12/02/2018 0908 11/14/18 1604  AST 51* 116*  ALT 37 63*  ALKPHOS 150* 164*  BILITOT 0.5 0.4  PROT 6.4* 6.5  ALBUMIN 2.8* 2.6*   No results for input(s): LIPASE, AMYLASE in the last 168 hours. No results for input(s): AMMONIA in the last 168 hours. Coagulation Profile: Recent Labs  Lab 12/05/2018 0908  INR 1.3*   CBC: Recent Labs  Lab 12/05/2018 0908 11/13/18 0414 11/14/18 1706  WBC 12.9* 13.1* 18.7*  NEUTROABS 10.6*  --   --   HGB 11.8* 10.9* 10.8*  HCT 40.6 37.2 37.2  MCV 101.2* 98.9 104.2*  PLT 357 354 314   Cardiac Enzymes: Recent Labs  Lab 11/14/2018 0908  TROPONINI 0.03*   BNP: Invalid input(s): POCBNP CBG: Recent Labs   Lab 11/13/18 1638 11/13/18 2131 11/14/18 0730 11/14/18 1102 11/14/18 1505  GLUCAP 160* 123* 186* 150* 173*   HbA1C: No results for input(s): HGBA1C in the last 72 hours. Urine analysis:    Component Value Date/Time   COLORURINE YELLOW 11/18/2018 0721   APPEARANCEUR CLOUDY (A) 11/21/2018 0721   LABSPEC 1.014 11/28/2018 0721   PHURINE 7.0 11/13/2018 0721   GLUCOSEU NEGATIVE 12/06/2018 0721   HGBUR NEGATIVE 11/23/2018 0721   BILIRUBINUR NEGATIVE 11/14/2018 0721   KETONESUR NEGATIVE 11/14/2018 0721   PROTEINUR 30 (A) 12/02/2018 0721   UROBILINOGEN 0.2 06/20/2013 1832   NITRITE NEGATIVE 11/24/2018 0721   LEUKOCYTESUR LARGE (A) 11/14/2018 0721   Sepsis Labs: @LABRCNTIP (procalcitonin:4,lacticidven:4) ) Recent Results (from the past 240 hour(s))  Urine culture     Status: Abnormal   Collection Time: 12/09/2018  7:21 AM  Result Value Ref Range Status   Specimen Description   Final    URINE, CLEAN CATCH Performed at [facility], [address], [place], [place]  [address]    Special Requests   Final    NONE Performed at [facility], [address] , [place] [address]    Culture (A)  Final    >=100,000 COLONIES/mL ESCHERICHIA COLI >=100,000 COLONIES/mL PROTEUS MIRABILIS Confirmed Extended Spectrum Beta-Lactamase Producer (ESBL).  In bloodstream infections from ESBL organisms, carbapenems are preferred over piperacillin/tazobactam. They are shown to have a lower risk of mortality.    Report Status 11/14/2018 FINAL  Final   Organism ID, Bacteria ESCHERICHIA COLI (A)  Final   Organism ID, Bacteria PROTEUS MIRABILIS (A)  Final      Susceptibility   Escherichia coli - MIC*    AMPICILLIN >=32 RESISTANT Resistant     CEFAZOLIN >=64 RESISTANT Resistant     CEFTRIAXONE >=64 RESISTANT Resistant     CIPROFLOXACIN >=4 RESISTANT Resistant     GENTAMICIN >=16 RESISTANT Resistant     IMIPENEM <=0.25 SENSITIVE Sensitive     NITROFURANTOIN 256 RESISTANT Resistant      TRIMETH/SULFA >=320 RESISTANT Resistant     AMPICILLIN/SULBACTAM >=32 RESISTANT Resistant     PIP/TAZO 8 SENSITIVE Sensitive     Extended ESBL POSITIVE Resistant     * >=100,000 COLONIES/mL ESCHERICHIA COLI   Proteus mirabilis - MIC*    AMPICILLIN <=2 SENSITIVE Sensitive     CEFAZOLIN 8 SENSITIVE Sensitive     CEFTRIAXONE <=1 SENSITIVE Sensitive     CIPROFLOXACIN >=4 RESISTANT Resistant     GENTAMICIN <=1 SENSITIVE Sensitive     IMIPENEM 4 SENSITIVE Sensitive     NITROFURANTOIN 128 RESISTANT Resistant     TRIMETH/SULFA >=320 RESISTANT Resistant     AMPICILLIN/SULBACTAM <=2 SENSITIVE Sensitive     PIP/TAZO <=4 SENSITIVE Sensitive     * >=100,000 COLONIES/mL PROTEUS MIRABILIS  SARS Coronavirus 2 (CEPHEID - Performed in [place] hospital lab), Hosp Order     Status: None   Collection Time: 11/29/2018  7:21 AM  Result Value Ref Range Status   SARS Coronavirus 2 NEGATIVE NEGATIVE Final    Comment: (NOTE) If result is NEGATIVE SARS-CoV-2 target nucleic acids are NOT DETECTED. The SARS-CoV-2 RNA is generally detectable in upper and lower  respiratory specimens during the acute phase of infection. The lowest  concentration of SARS-CoV-2 viral copies this assay can detect is 250  copies / mL. A negative result does not preclude SARS-CoV-2 infection  and should not be used as the sole basis for treatment or other  patient management decisions.  A negative result may occur with  improper specimen collection / handling, submission of specimen other  than nasopharyngeal swab, presence of viral mutation(s) within the  areas targeted by this assay, and inadequate number of viral copies  (<250 copies / mL). A negative result must be combined with clinical  observations, patient history, and epidemiological information. If result is POSITIVE SARS-CoV-2 target nucleic acids are DETECTED. The SARS-CoV-2 RNA is generally detectable in upper and lower  respiratory specimens dur ing the acute phase  of infection.  Positive  results are indicative of active infection with SARS-CoV-2.  Clinical  correlation with patient history and other diagnostic information is  necessary to determine patient infection status.  Positive results do  not rule out bacterial infection or co-infection with other viruses. If result is PRESUMPTIVE POSTIVE SARS-CoV-2 nucleic acids MAY BE PRESENT.   A presumptive positive result was obtained on the submitted specimen  and confirmed on repeat testing.  While 2019 novel coronavirus  (SARS-CoV-2) nucleic acids may be present in  the submitted sample  additional confirmatory testing may be necessary for epidemiological  and / or clinical management purposes  to differentiate between  SARS-CoV-2 and other Sarbecovirus currently known to infect humans.  If clinically indicated additional testing with an alternate test  methodology [phone]) is advised. The SARS-CoV-2 RNA is generally  detectable in upper and lower respiratory sp ecimens during the acute  phase of infection. The expected result is Negative. Fact Sheet for Patients:  StrictlyIdeas.no Fact Sheet for Healthcare Providers: BankingDealers.co.za This test is not yet approved or cleared by the [place] FDA and has been authorized for detection and/or diagnosis of SARS-CoV-2 by FDA under an Emergency Use Authorization (EUA).  This EUA will remain in effect (meaning this test can be used) for the duration of the COVID-19 declaration under Section 564(b)(1) of the Act, 21 U.S.C. section 360bbb-3(b)(1), unless the authorization is terminated or revoked sooner. Performed at [facility], [address], [place], [place] [address]   MRSA PCR Screening     Status: None   Collection Time: 11/27/2018 11:45 AM  Result Value Ref Range Status   MRSA by PCR NEGATIVE NEGATIVE Final    Comment:        The GeneXpert MRSA Assay (FDA approved for NASAL  specimens only), is one component of a comprehensive MRSA colonization surveillance program. It is not intended to diagnose MRSA infection nor to guide or monitor treatment for MRSA infections. Performed at [facility], [address], [place], [place] [address]      Scheduled Meds: Continuous Infusions:  morphine 2 mg/hr (Dec 04, 2018 0200)   norepinephrine (LEVOPHED) Adult infusion 2 mcg/min (11/14/18 1715)    Procedures/Studies: Ct Head Wo Contrast  Result Date: 11/14/2018 CLINICAL DATA:  Query toxic/metabolic encephalopathy. EXAM: CT HEAD WITHOUT CONTRAST TECHNIQUE: Contiguous axial images were obtained from the base of the skull through the vertex without intravenous contrast. COMPARISON:  CT head 06/08/2018. FINDINGS: Significant motion degradation. Study is of marginal diagnostic utility. Brain: No visible acute stroke, hydrocephalus, hemorrhage, mass lesion, or extra-axial fluid. Generalized atrophy. White matter hypoattenuation, likely small vessel disease. Vascular: No hyperdense vessel or unexpected calcification. Skull: Normal. Negative for fracture or focal lesion. Sinuses/Orbits: No acute finding. Other: Similar appearance to priors. IMPRESSION: Motion degraded exam demonstrating no definite acute intracranial findings. MRI is not recommended for further evaluation in this patient, until mental status improves, given the inability to remain motionless with CT. Electronically Signed   By: Staci Righter M.D.   On: 11/14/2018 15:08   Dg Chest Port 1 View  Result Date: 11/14/2018 CLINICAL DATA:  Intubation. EXAM: PORTABLE CHEST 1 VIEW COMPARISON:  Chest x-ray dated November 12, 2018. FINDINGS: Interval intubation with the endotracheal tube in the right mainstem bronchus. Recommend retracting 4.7 cm. Stable cardiomediastinal silhouette status post CABG. Unchanged diffuse interstitial thickening and large right pleural effusion. No pneumothorax. No acute osseous abnormality. IMPRESSION: 1.  Endotracheal tube in the right mainstem bronchus. Recommend retracting 4.7 cm. 2. Unchanged pulmonary interstitial edema and large right pleural effusion. These results will be called to the ordering clinician or representative by the Radiologist Assistant, and communication documented in the PACS or zVision Dashboard. Electronically Signed   By: Titus Dubin M.D.   On: 11/14/2018 17:55   Dg Chest Port 1 View  Result Date: 11/17/2018 CLINICAL DATA:  Shortness of breath 3 days EXAM: PORTABLE CHEST 1 VIEW COMPARISON:  None. FINDINGS: Large right pleural effusion. Bilateral interstitial prominence. Trace left pleural effusion. No pneumothorax. Stable cardiomediastinal  silhouette. Prior median sternotomy. No acute osseous abnormality. IMPRESSION: 1. Large right pleural effusion.  Trace left pleural effusion. Electronically Signed   By: Kathreen Devoid   On: 12/07/2018 08:18    Orson Eva, DO  [facility] Pager [phone]  If 7PM-7AM, please contact night-coverage [email or web address] Password [email or web address] 12-06-2018, 8:39 AM   LOS: 3 days

## 2018-12-10 NOTE — Death Summary Note (Signed)
DEATH SUMMARY   Patient Details  Name: [name] MRN: [id number] DOB: 1946-02-22  Admission/Discharge Information   Admit Date:  Dec 10, 2018  Date of Death: Date of Death: December 13, 2018  Time of Death: Time of Death: 1000  Length of Stay: 3  Referring Physician: [name], MD   Reason(s) for Hospitalization  CHF, dyspnea  Diagnoses  Preliminary cause of death:  Acute diastolic CHF Secondary Diagnoses (including complications and co-morbidities):  Acute diastolic CHF -initially started IV furosemide 40 mg IV twice daily -I/Os--not accurate -daily weights--Neg8.4lbs since admission -pt has been transitioned to focus on comfort care after discussion of goals of care with spouse -as such, all labs and xrays were discontinued and all medications not focused on her comfort were stopped  Acute on chronic respiratory failure with hypoxia -normally on 2.5 L at home -currently stable on5L -wean oxygen for sat >92% -pt was intubated after cardiopulmonary arrest -compassionate extubation performed after discussion of goals of care with spouse -pt has been transitioned to focus on comfort care after discussion of goals of care with spouse -as such, all labs and xrays were discontinued and all medications not focused on her comfort were stopped  Paroxysmal Atrial Fibrillation with RVR -continue apixaban -continue metoprolol as BP allows -continue diltiazem drip>>transitioned to po diltiazem 6/4 -increase po dilitazem to 60 mg q 6 -consult cardiology--appreciated -pt has been transitioned to focus on comfort care after discussion of goals of care with spouse -as such, all labs and xrays were discontinued and all medications not focused on her comfort were stopped  Acute toxic/metabolic Encephalopathy -RN reports pt was more alert at beginning of shift on 6/5 -Norco given at 0912 -pt confused, somnolent but arousable and followed commands -discontinued Norco for now -if need  to restart-->reduce dose -CT brain--neg -pt has been transitioned to focus on comfort care after discussion of goals of care with spouse -as such, all labs and xrays were discontinued and all medications not focused on her comfort were stopped  CKD stage 3 -baseline creatinine 1.0-1.3 -pt has been transitioned to focus on comfort care after discussion of goals of care with spouse -as such, all labs and xrays were discontinued and all medications not focused on her comfort were stopped   Diabetes mellitus type 2, uncontrolled with hyperglycemia -10/13/2018 hemoglobin A1c 8.3 -Increase Lantus to 14 units -NovoLog sliding scale -Holding glipizide -pt has been transitioned to focus on comfort care after discussion of goals of care with spouse -as such, all labs and xrays were discontinued and all medications not focused on her comfort were stopped   ESBL EColi UTI -discontinueceftriaxone  -started Merrem initially  Essential hypertension -Blood pressure is soft on diltiazem drip -Continue metoprolol succinate -Holding Isordildue to soft BPs  FEN -continue Osmolite enteral feeding--rate increased to 45 cc/hr per nutrition recommendations -pt has been transitioned to focus on comfort care after discussion of goals of care with spouse -as such, all labs and xrays were discontinued and all medications not focused on her comfort were stopped  Coronary artery disease -No chest pain presently -Continuemetoprolol initially -pt has been transitioned to focus on comfort care after discussion of goals of care with spouse -as such, all labs and xrays were discontinued and all medications not focused on her comfort were stopped  Rheumatoid arthritis -Morrie Sheldon on hold(previouslyon this x 3-4 yrs) -pt follows Dr. Amil Amen in outpatient setting  Hypomagnesemia -repleted  Hypokalemia -repleted  Chronic pain syndrome/Fibromyalgia -held hydrocodone for now due to new  AMS  History of stroke and TIA -Continueapixaban  Stage 2 pressure injury--buttock -present on admission -local wound care  Goals of Care -FULL comfort confirmed with spouse at bedside -anticipatory care was discussed with spouse -spouse comforted and updated   Brief Hospital Course (including significant findings, care, treatment, and services provided and events leading to death)  73 y.o.femalewith medical historyof coronary artery disease, hyperlipidemia, stroke, hypertension, diabetes mellitus, rheumatoid arthritis, fibromyalgia/chronic pain presenting with3-week history of shortness of breath. The patient has been having associated increasing lower extremity edema and orthopnea. Unfortunately, the patient is adifficulthistorian.  The patient was recently discharged from the nursing home in [place] approximately 6 weeks prior to this admission. She was taking 60 mg of torsemide daily. However in the past week, the patient had decrease it to 40 mg daily for unclear reasons. The patient denies any chest pain, fever, chills, nausea, vomiting, diarrhea, abdominal pain, dysuria, hematuria. The patient does not frequently weigh herself at home. However, the patient was noted to have a cardiology visit at Dr. Einar Gip on 5/13/20where she weighed 220 pounds. The patient was recently admitted to the hospital from 04/05/2018 through 05/13/2018 due to perforated sigmoid diverticulitis with abscess. The patient hadan exploratory laparotomy with lysis of adhesions and small bowel resection with loop ileostomy on 04/30/2018 performed byDr. [name].She was also admitted from 06/13/2018 through 06/17/2018 for new onset atrial fibrillation with RVR. After that admission, the patient went to [facility] rehab. She subsequently developed pneumonia and required intubation. She then spent a little over a month at [facility] where a PEG was placed. Subsequently, she was discharged to [facility] in [place].    In the emergency department, the patient was afebrile hemodynamically stable albeit with soft blood pressures. She was noted to have atrial fibrillation with RVR with heart rate in the 140s. The patient was started on diltiazem drip. She was started on IV furosemide. Chest x-ray showed large left pleural effusion with bilateral interstitial prominence. BMP showed a serum creatinine 1.30. WBC was 12.9. Urinalysis showed greater than 50 WBC.  Cardiology was consulted initially to assist with management.  In the morning of 11/14/2018, the patient was complaining of her usual back and leg pain.  Her home hydrocodone was restarted.  The patient subsequently developed some somnolence and confusion, but was arousable and still followed commands.  CT of the brain was ordered.  When the patient came back from CT, she was noted to be bradycardic and cyanotic. "  CODE BLUE" was called, and ACLS protocol was initiated.  ROSC was obtained.  Subsequently, extensive goals of care discussion was undertaken with the patient's family, particularly with the patient's husband who was primary caregiver. I explained that her overall prognosis remains poor in the setting of her multiple co-morbidities. I explained that in the best of circumstances, even if the patient were to survive, she would not likely return to her pre-morbid state. He stated that he has been struggling with the decision to make her DNR. Ultimately, he explained that she has been having pain and suffering for the better part of the last 8-9 months with multiple hospitalizations and continued functional decline. He expressed that he understood that her quality of life is poor and that "she would not want to live like this." He did not want the patient to have any more pain and suffering and expressed to me that he wanted to transition the patient's care to focus solely on her comfort and stopping further testing and blood draws.  All medications not  focused on patient's comfort will be discontinued. Morphine drip was initiated and compassionate extubation was performed.  The patient remained comfortable without any signs of pain or distress.   Pertinent Labs and Studies  Significant Diagnostic Studies Ct Head Wo Contrast  Result Date: 11/14/2018 CLINICAL DATA:  Query toxic/metabolic encephalopathy. EXAM: CT HEAD WITHOUT CONTRAST TECHNIQUE: Contiguous axial images were obtained from the base of the skull through the vertex without intravenous contrast. COMPARISON:  CT head 06/08/2018. FINDINGS: Significant motion degradation. Study is of marginal diagnostic utility. Brain: No visible acute stroke, hydrocephalus, hemorrhage, mass lesion, or extra-axial fluid. Generalized atrophy. White matter hypoattenuation, likely small vessel disease. Vascular: No hyperdense vessel or unexpected calcification. Skull: Normal. Negative for fracture or focal lesion. Sinuses/Orbits: No acute finding. Other: Similar appearance to priors. IMPRESSION: Motion degraded exam demonstrating no definite acute intracranial findings. MRI is not recommended for further evaluation in this patient, until mental status improves, given the inability to remain motionless with CT. Electronically Signed   By: Staci Righter M.D.   On: 11/14/2018 15:08   Dg Chest Port 1 View  Result Date: 11/14/2018 CLINICAL DATA:  Intubation. EXAM: PORTABLE CHEST 1 VIEW COMPARISON:  Chest x-ray dated November 12, 2018. FINDINGS: Interval intubation with the endotracheal tube in the right mainstem bronchus. Recommend retracting 4.7 cm. Stable cardiomediastinal silhouette status post CABG. Unchanged diffuse interstitial thickening and large right pleural effusion. No pneumothorax. No acute osseous abnormality. IMPRESSION: 1. Endotracheal tube in the right mainstem bronchus. Recommend retracting 4.7 cm. 2. Unchanged pulmonary interstitial edema and large right pleural effusion. These results will be called to the  ordering clinician or representative by the Radiologist Assistant, and communication documented in the PACS or zVision Dashboard. Electronically Signed   By: Titus Dubin M.D.   On: 11/14/2018 17:55   Dg Chest Port 1 View  Result Date: 12/06/2018 CLINICAL DATA:  Shortness of breath 3 days EXAM: PORTABLE CHEST 1 VIEW COMPARISON:  None. FINDINGS: Large right pleural effusion. Bilateral interstitial prominence. Trace left pleural effusion. No pneumothorax. Stable cardiomediastinal silhouette. Prior median sternotomy. No acute osseous abnormality. IMPRESSION: 1. Large right pleural effusion.  Trace left pleural effusion. Electronically Signed   By: Kathreen Devoid   On: 12/02/2018 08:18    Microbiology Recent Results (from the past 240 hour(s))  Urine culture     Status: Abnormal   Collection Time: 11/10/2018  7:21 AM  Result Value Ref Range Status   Specimen Description   Final    URINE, CLEAN CATCH Performed at [facility], [address] [place], [place] [address]    Special Requests   Final    NONE Performed at [facility], [address] [place], [place] [address]    Culture (A)  Final    >=100,000 COLONIES/mL ESCHERICHIA COLI >=100,000 COLONIES/mL PROTEUS MIRABILIS Confirmed Extended Spectrum Beta-Lactamase Producer (ESBL).  In bloodstream infections from ESBL organisms, carbapenems are preferred over piperacillin/tazobactam. They are shown to have a lower risk of mortality.    Report Status 11/14/2018 FINAL  Final   Organism ID, Bacteria ESCHERICHIA COLI (A)  Final   Organism ID, Bacteria PROTEUS MIRABILIS (A)  Final      Susceptibility   Escherichia coli - MIC*    AMPICILLIN >=32 RESISTANT Resistant     CEFAZOLIN >=64 RESISTANT Resistant     CEFTRIAXONE >=64 RESISTANT Resistant     CIPROFLOXACIN >=4 RESISTANT Resistant     GENTAMICIN >=16 RESISTANT Resistant     IMIPENEM <=0.25  SENSITIVE Sensitive     NITROFURANTOIN 256 RESISTANT Resistant     TRIMETH/SULFA >=320 RESISTANT  Resistant     AMPICILLIN/SULBACTAM >=32 RESISTANT Resistant     PIP/TAZO 8 SENSITIVE Sensitive     Extended ESBL POSITIVE Resistant     * >=100,000 COLONIES/mL ESCHERICHIA COLI   Proteus mirabilis - MIC*    AMPICILLIN <=2 SENSITIVE Sensitive     CEFAZOLIN 8 SENSITIVE Sensitive     CEFTRIAXONE <=1 SENSITIVE Sensitive     CIPROFLOXACIN >=4 RESISTANT Resistant     GENTAMICIN <=1 SENSITIVE Sensitive     IMIPENEM 4 SENSITIVE Sensitive     NITROFURANTOIN 128 RESISTANT Resistant     TRIMETH/SULFA >=320 RESISTANT Resistant     AMPICILLIN/SULBACTAM <=2 SENSITIVE Sensitive     PIP/TAZO <=4 SENSITIVE Sensitive     * >=100,000 COLONIES/mL PROTEUS MIRABILIS  SARS Coronavirus 2 (CEPHEID - Performed in [place] hospital lab), Hosp Order     Status: None   Collection Time: 12/09/2018  7:21 AM  Result Value Ref Range Status   SARS Coronavirus 2 NEGATIVE NEGATIVE Final    Comment: (NOTE) If result is NEGATIVE SARS-CoV-2 target nucleic acids are NOT DETECTED. The SARS-CoV-2 RNA is generally detectable in upper and lower  respiratory specimens during the acute phase of infection. The lowest  concentration of SARS-CoV-2 viral copies this assay can detect is 250  copies / mL. A negative result does not preclude SARS-CoV-2 infection  and should not be used as the sole basis for treatment or other  patient management decisions.  A negative result may occur with  improper specimen collection / handling, submission of specimen other  than nasopharyngeal swab, presence of viral mutation(s) within the  areas targeted by this assay, and inadequate number of viral copies  (<250 copies / mL). A negative result must be combined with clinical  observations, patient history, and epidemiological information. If result is POSITIVE SARS-CoV-2 target nucleic acids are DETECTED. The SARS-CoV-2 RNA is generally detectable in upper and lower  respiratory specimens dur ing the acute phase of infection.  Positive   results are indicative of active infection with SARS-CoV-2.  Clinical  correlation with patient history and other diagnostic information is  necessary to determine patient infection status.  Positive results do  not rule out bacterial infection or co-infection with other viruses. If result is PRESUMPTIVE POSTIVE SARS-CoV-2 nucleic acids MAY BE PRESENT.   A presumptive positive result was obtained on the submitted specimen  and confirmed on repeat testing.  While 2019 novel coronavirus  (SARS-CoV-2) nucleic acids may be present in the submitted sample  additional confirmatory testing may be necessary for epidemiological  and / or clinical management purposes  to differentiate between  SARS-CoV-2 and other Sarbecovirus currently known to infect humans.  If clinically indicated additional testing with an alternate test  methodology [id number]) is advised. The SARS-CoV-2 RNA is generally  detectable in upper and lower respiratory sp ecimens during the acute  phase of infection. The expected result is Negative. Fact Sheet for Patients:  StrictlyIdeas.no Fact Sheet for Healthcare Providers: BankingDealers.co.za This test is not yet approved or cleared by the [place] FDA and has been authorized for detection and/or diagnosis of SARS-CoV-2 by FDA under an Emergency Use Authorization (EUA).  This EUA will remain in effect (meaning this test can be used) for the duration of the COVID-19 declaration under Section 564(b)(1) of the Act, 21 U.S.C. section 360bbb-3(b)(1), unless the authorization is terminated or revoked sooner. Performed  at [facility], [address] [place], [place] [address]   MRSA PCR Screening     Status: None   Collection Time: 11/30/2018 11:45 AM  Result Value Ref Range Status   MRSA by PCR NEGATIVE NEGATIVE Final    Comment:        The GeneXpert MRSA Assay (FDA approved for NASAL specimens only), is one component of  a comprehensive MRSA colonization surveillance program. It is not intended to diagnose MRSA infection nor to guide or monitor treatment for MRSA infections. Performed at [facility], [address] [place], [place] [address]     Lab Basic Metabolic Panel: Recent Labs  Lab 11/19/2018 0908 11/13/18 0414 11/14/18 0414 11/14/18 1604  NA 140 141 141 142  K 4.5 3.4* 4.6 5.6*  CL 85* 90* 92* 92*  CO2 37* 38* 35* 34*  GLUCOSE 130* 138* 194* 214*  BUN 49* 39* 43* 50*  CREATININE 1.30* 1.16* 1.17* 1.50*  CALCIUM 9.3 8.7* 9.0 9.0  MG 1.4* 1.8 1.9 2.2  PHOS  --   --   --  6.9*   Liver Function Tests: Recent Labs  Lab 11/30/2018 0908 11/14/18 1604  AST 51* 116*  ALT 37 63*  ALKPHOS 150* 164*  BILITOT 0.5 0.4  PROT 6.4* 6.5  ALBUMIN 2.8* 2.6*   No results for input(s): LIPASE, AMYLASE in the last 168 hours. No results for input(s): AMMONIA in the last 168 hours. CBC: Recent Labs  Lab 11/13/2018 0908 11/13/18 0414 11/14/18 1706  WBC 12.9* 13.1* 18.7*  NEUTROABS 10.6*  --   --   HGB 11.8* 10.9* 10.8*  HCT 40.6 37.2 37.2  MCV 101.2* 98.9 104.2*  PLT 357 354 314   Cardiac Enzymes: Recent Labs  Lab 11/13/2018 0908  TROPONINI 0.03*   Sepsis Labs: Recent Labs  Lab 11/25/2018 0908 11/16/2018 1105 11/13/18 0414 11/14/18 1706  WBC 12.9*  --  13.1* 18.7*  LATICACIDVEN 1.3 1.4  --   --     Procedures/Operations  Intubation 11/14/18   Shanon Brow Balbina Depace 2018-11-27, 5:39 PM

## 2018-12-10 NOTE — Progress Notes (Signed)
Pt transported to morgue at this time with dentures and pillow. Morphine drip wasted with pharmacy at this time d/t previous form not in chart.

## 2018-12-10 DEATH — deceased

## 2019-03-07 IMAGING — US US EXTREM  UP VENOUS*R*
1 series · 13 of 24 positions shown · non-contrast
Comparison: None.

CLINICAL DATA: None



[Series 1: us extrem up venous*right* · 0.11mm/px · 13 of 45 slices shown]
[im 1/45]
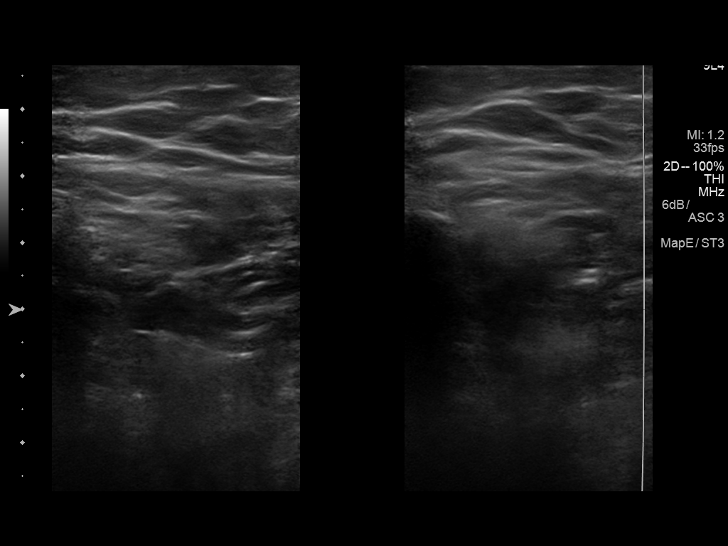
[im 4/45]
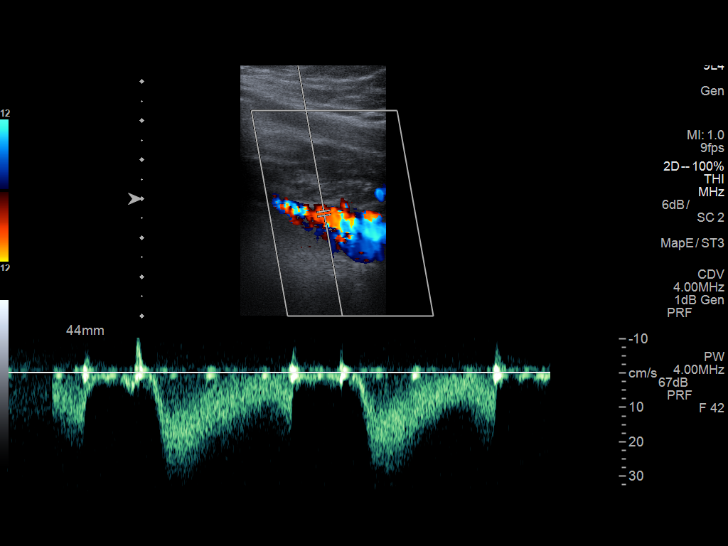
[im 8/45]
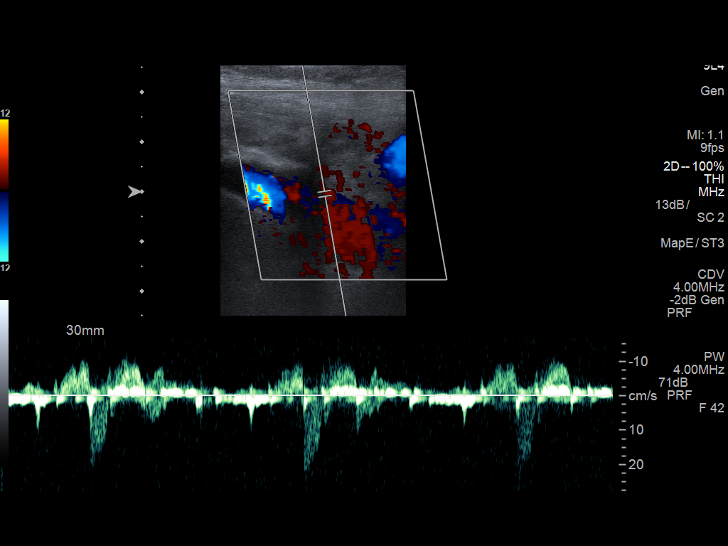
[im 12/45]
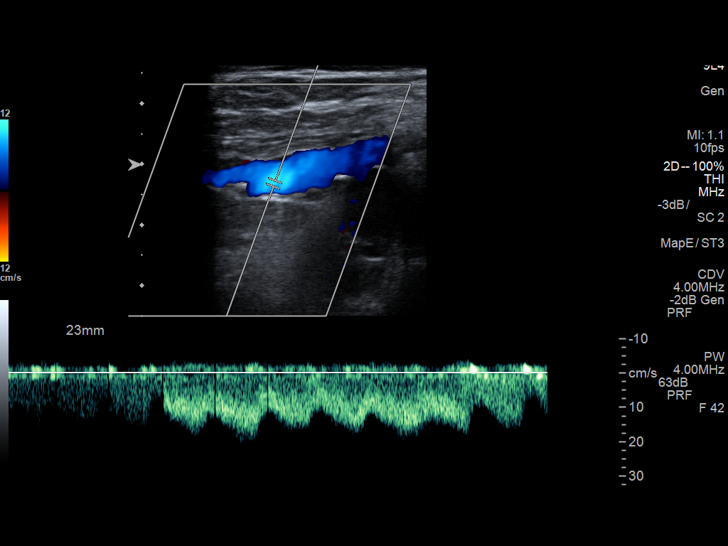
[im 16/45]
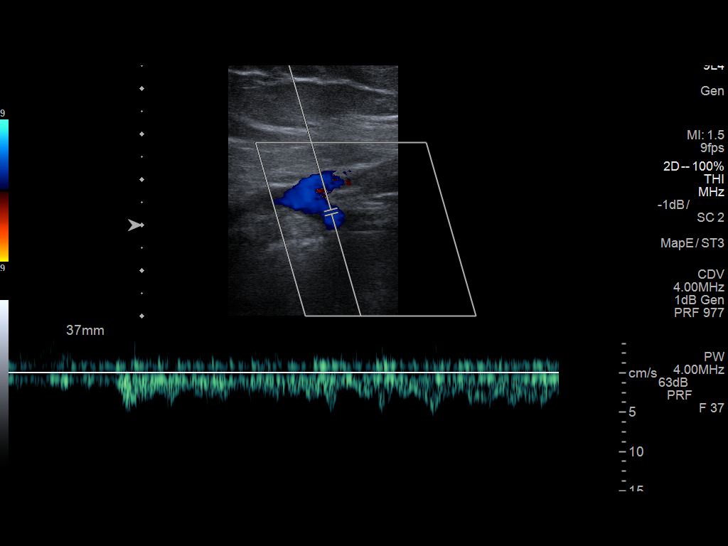
[im 20/45]
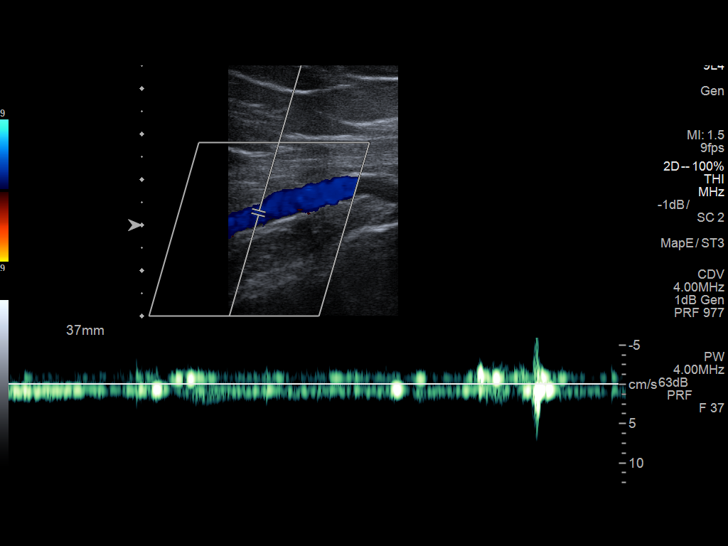
[im 23/45]
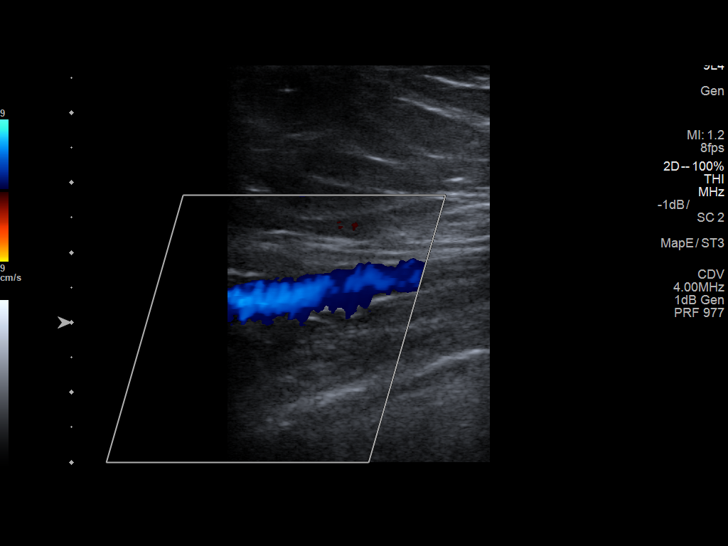
[im 25/45]
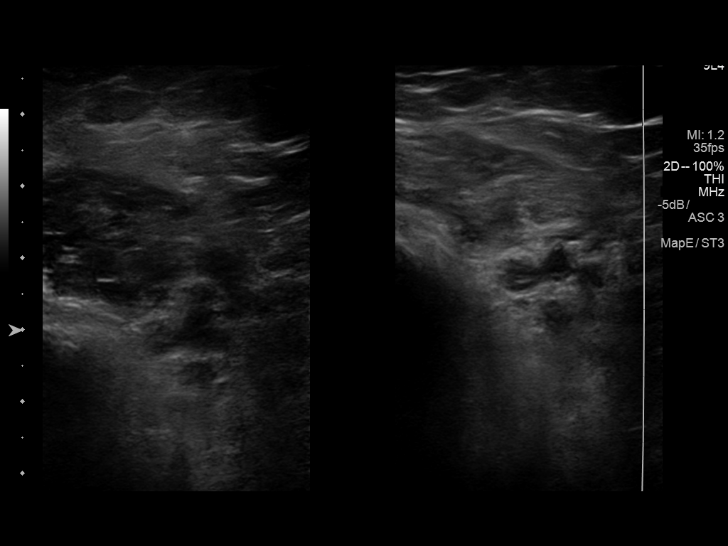
[im 29/45]
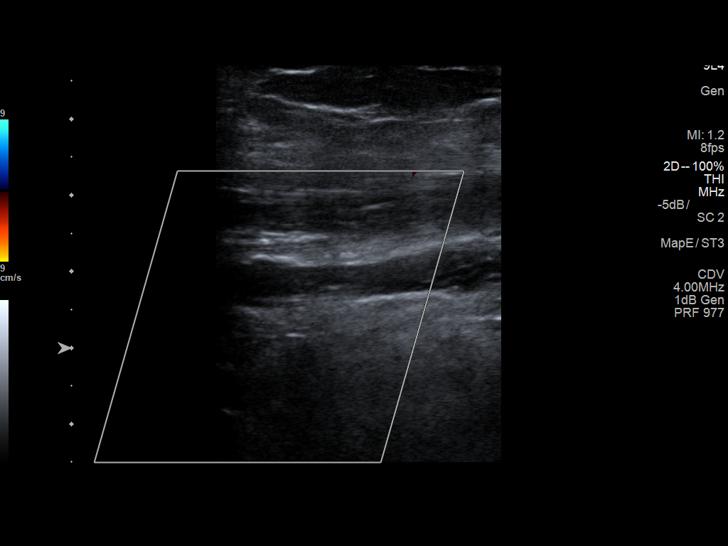
[im 33/45]
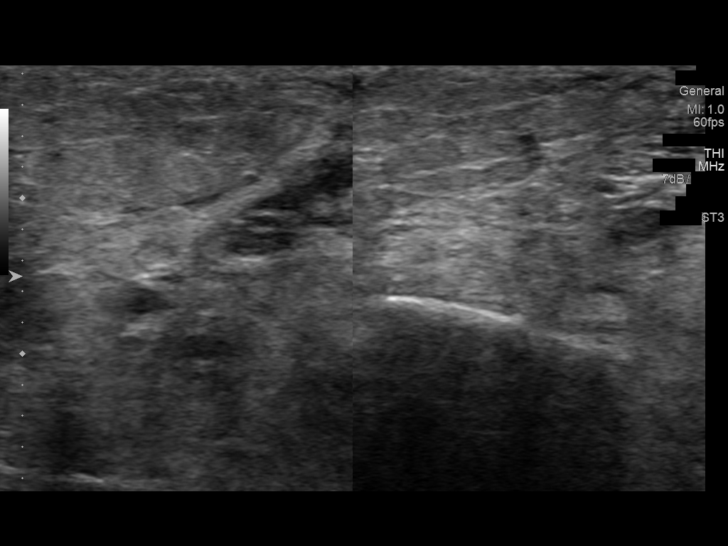
[im 37/45]
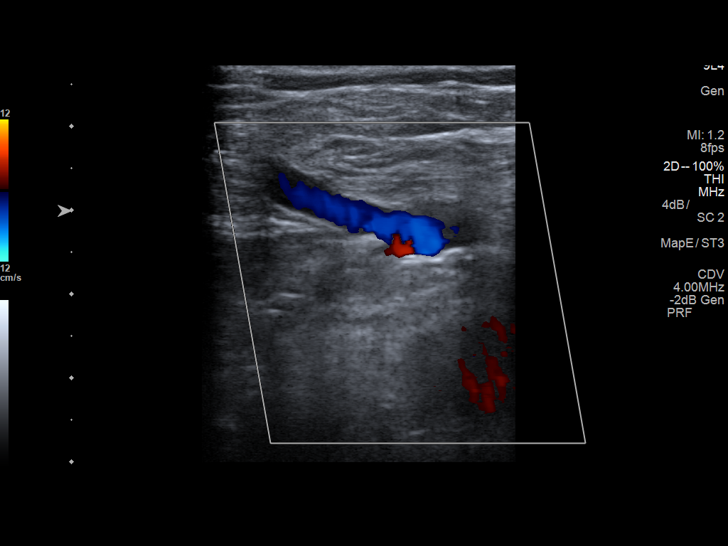
[im 41/45]
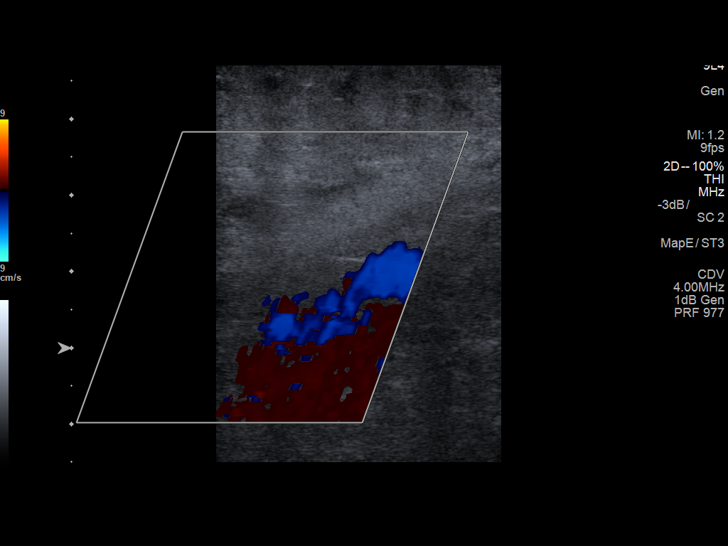
[im 45/45]
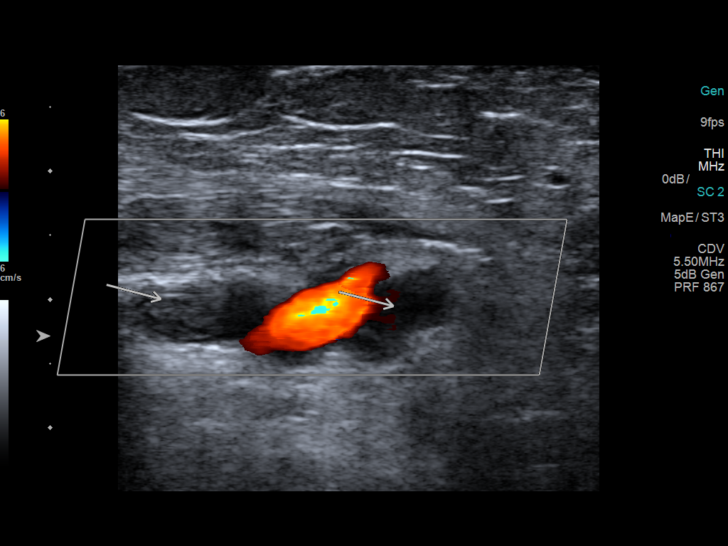

[13 of 24 positions shown; findings below may reference images not displayed]

FINDINGS: Contralateral Subclavian Vein: Respiratory phasicity is normal and
symmetric with the symptomatic side. No evidence of thrombus. Normal
compressibility.

Internal Jugular Vein: No evidence of thrombus. Normal
compressibility, respiratory phasicity and response to augmentation.

Subclavian Vein: No evidence of thrombus. Normal compressibility,
respiratory phasicity and response to augmentation.

Axillary Vein: No evidence of thrombus. Normal compressibility,
respiratory phasicity and response to augmentation.

Cephalic Vein: No evidence of thrombus. Normal compressibility,
respiratory phasicity and response to augmentation.

Basilic Vein: No evidence of thrombus. Normal compressibility,
respiratory phasicity and response to augmentation.

Brachial Veins: Occlusive thrombus of the right brachial vein,
paired brachial veins.

Radial Veins: No evidence of thrombus. Normal compressibility,
respiratory phasicity and response to augmentation.

Ulnar Veins: No evidence of thrombus. Normal compressibility,
respiratory phasicity and response to augmentation.

Other Findings:  Edema
IMPRESSION: Sonographic survey of the right upper extremity is positive for
occlusive DVT of the paired right brachial vein.

## 2020-03-14 IMAGING — CR DG CHEST 1V PORT
1 series · 1 of 1 positions shown · non-contrast
Comparison: Chest radiograph 04/06/2018.

CLINICAL DATA: PICC placement.

EXAM:
PORTABLE CHEST 1 VIEW

[portable]
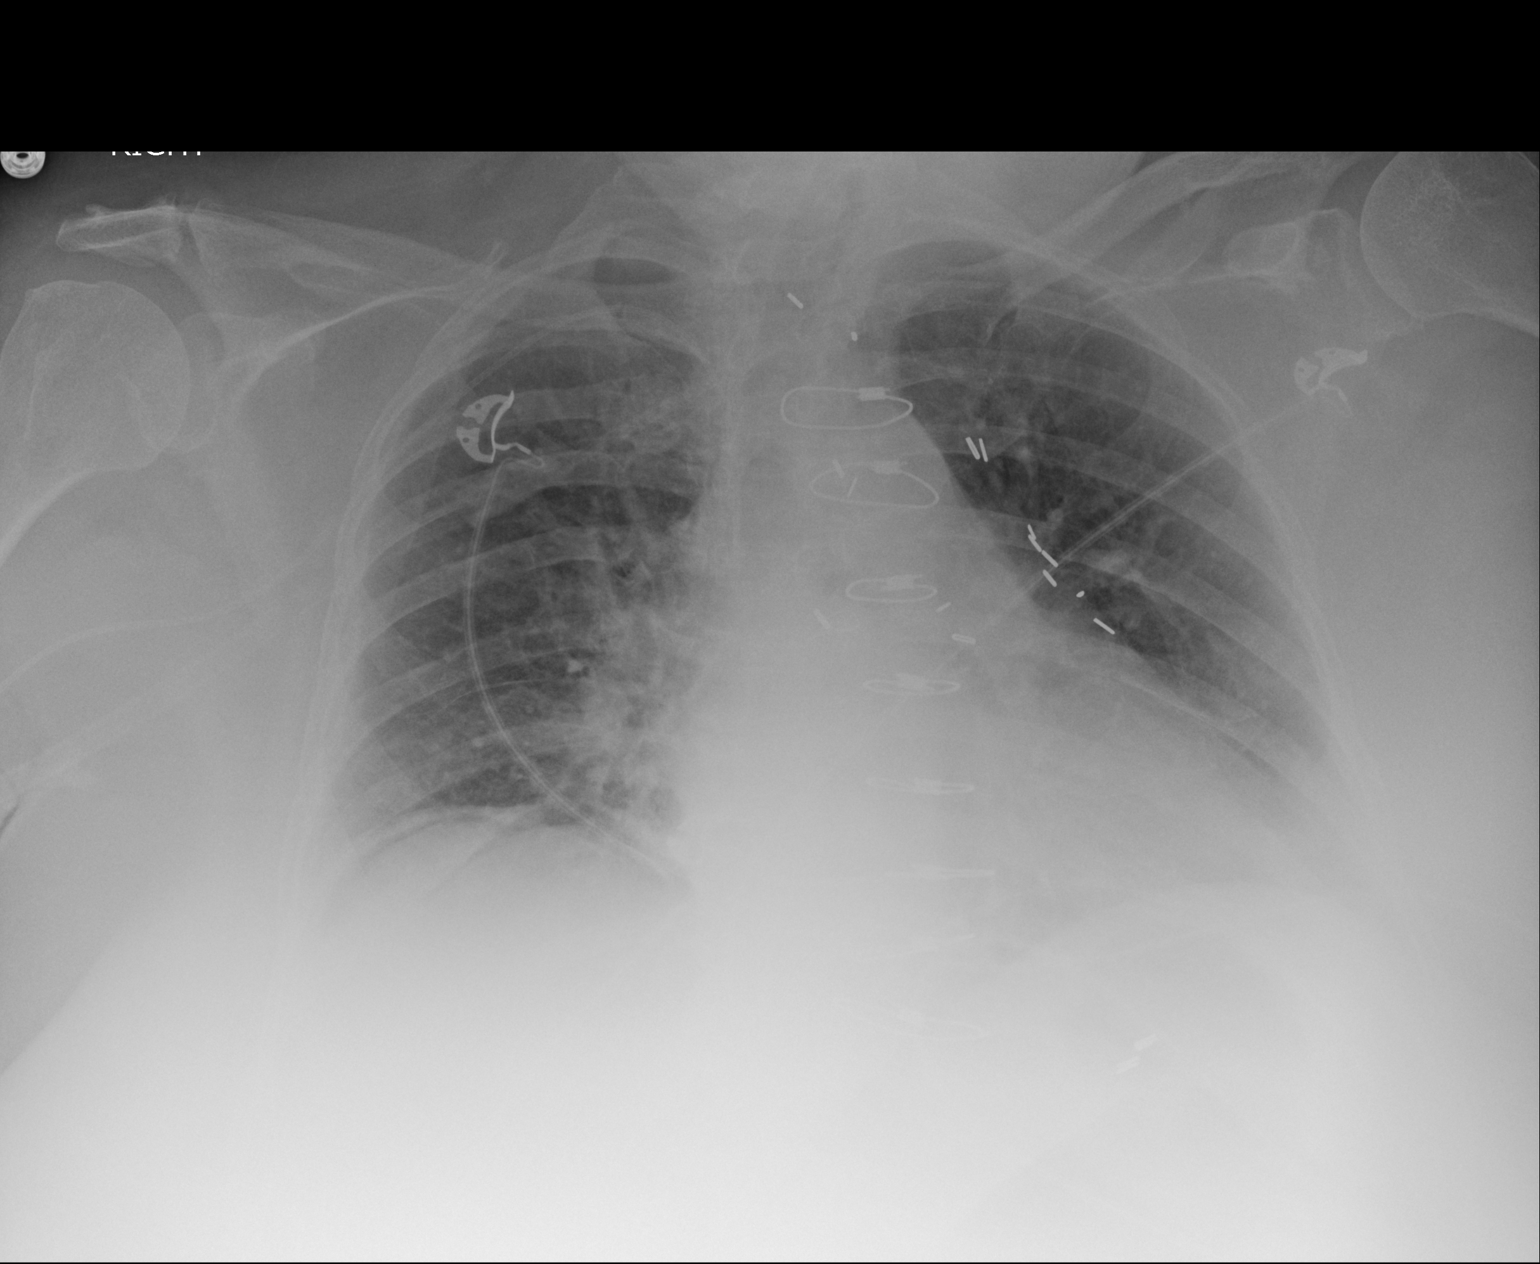

[1 of 1 positions shown; findings below may reference images not displayed]

FINDINGS: Cardiomegaly.  Mild vascular congestion.  Prior CABG.

RIGHT arm PICC line tip appears to lie distal SVC. This is marked
with an arrow. No pneumothorax.
IMPRESSION: RIGHT arm PICC line tip appears to lie distal SVC. No pneumothorax.
Cardiomegaly with mild vascular congestion, stable from priors.

## 2020-03-25 IMAGING — CT CT ABD-PELV W/O CM
2 of 4 series · 15 of 46 positions shown, 17 images · non-contrast
Comparison: 04/15/2018

CLINICAL DATA: Follow-up diverticulitis

EXAM:
CT ABDOMEN AND PELVIS WITHOUT CONTRAST
TECHNIQUE: Multidetector CT imaging of the abdomen and pelvis was performed
following the standard protocol without IV contrast. Sagittal and
coronal MPR images reconstructed from axial data set. Patient drank
dilute oral contrast for exam

[Series 3: abd/ pelvis 5.0 i30f 2 · axial · 0.98mm/px · z∈[+1108,+1544]mm · 12 of 99 slices shown, 14 images]
[im 8/99  soft-tissue]
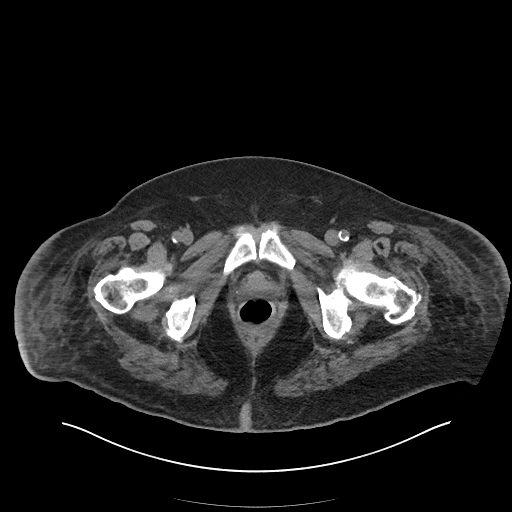
[im 8/99  bone]
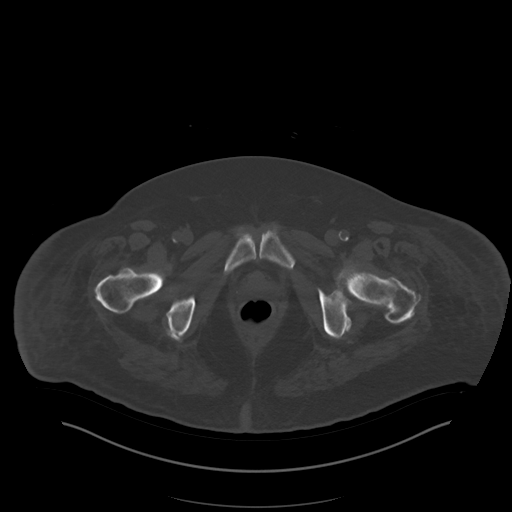
[im 16/99  soft-tissue]
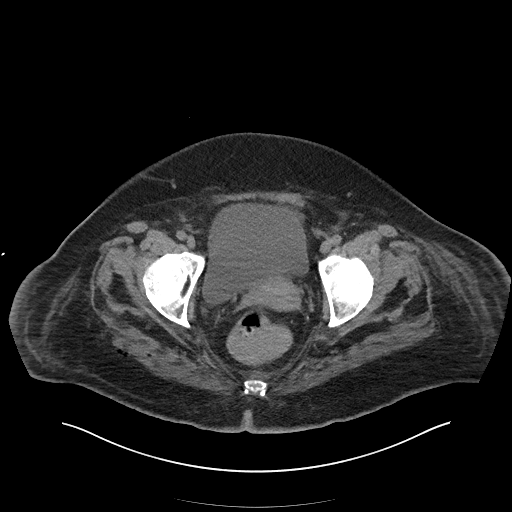
[im 24/99  soft-tissue]
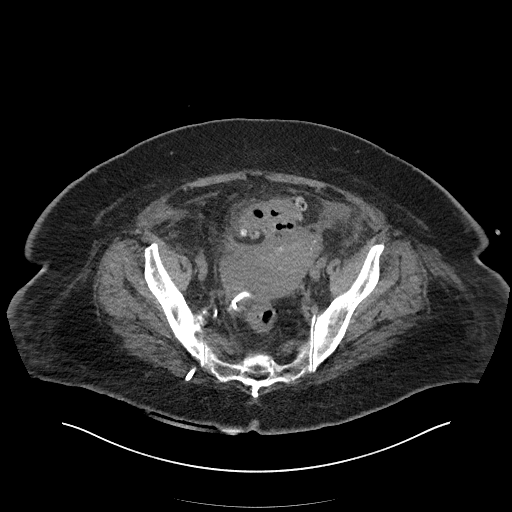
[im 32/99  soft-tissue]
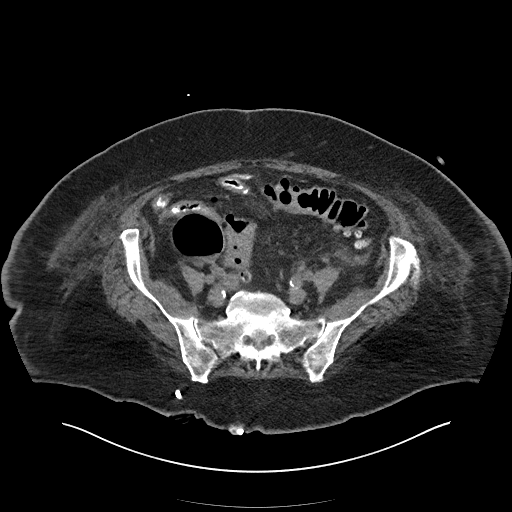
[im 40/99  soft-tissue]
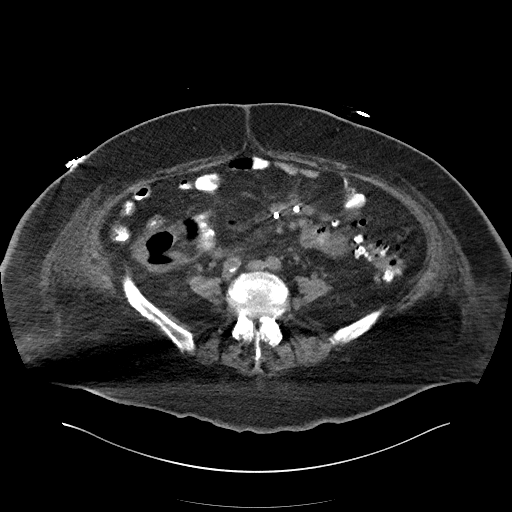
[im 48/99  soft-tissue]
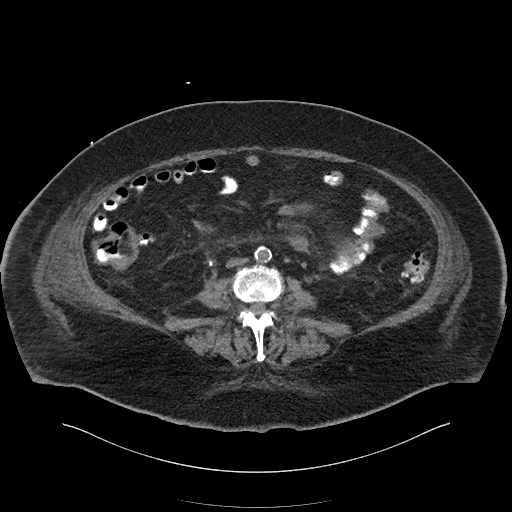
[im 55/99  soft-tissue]
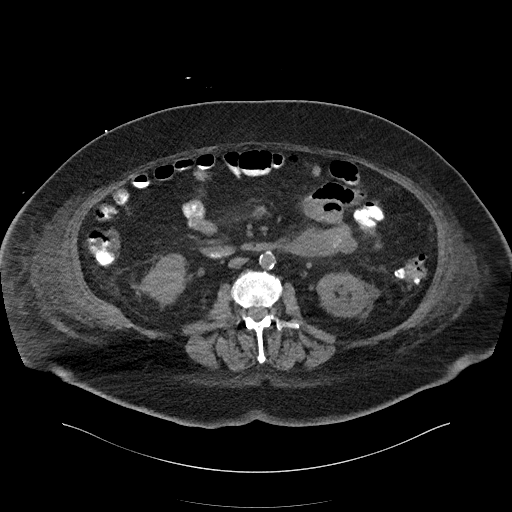
[im 63/99  soft-tissue]
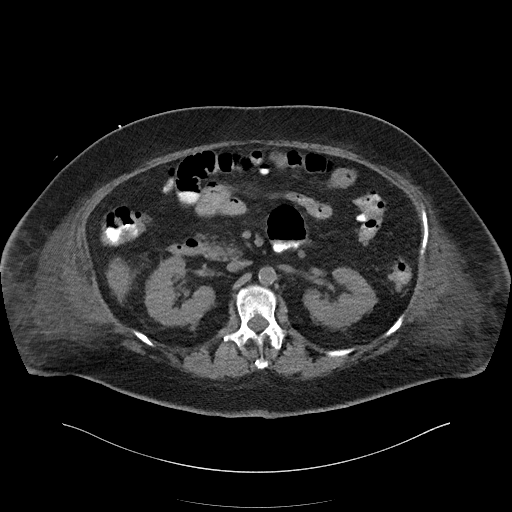
[im 71/99  soft-tissue]
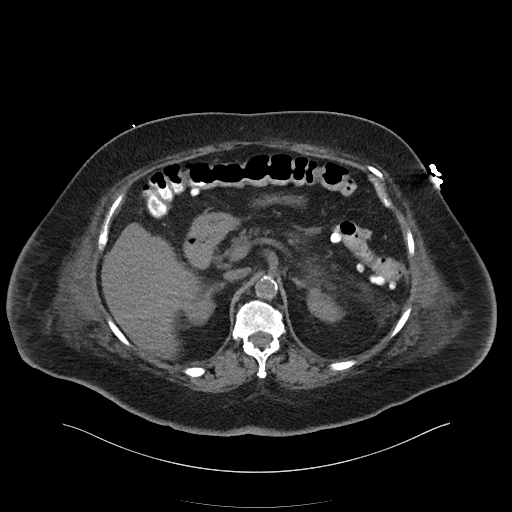
[im 71/99  bone]
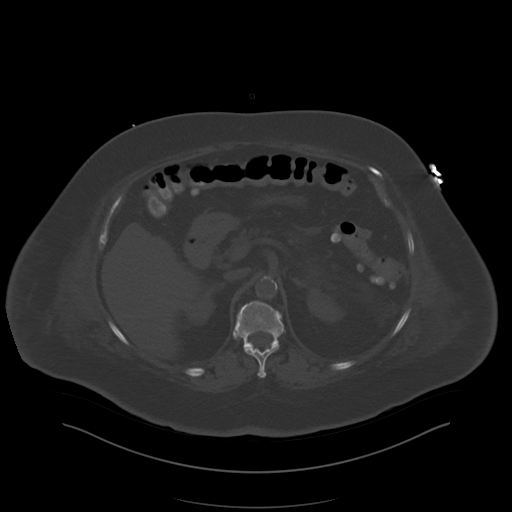
[im 79/99  soft-tissue]
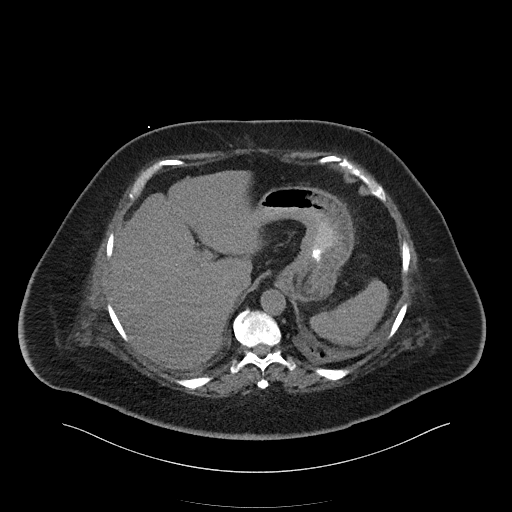
[im 87/99  soft-tissue]
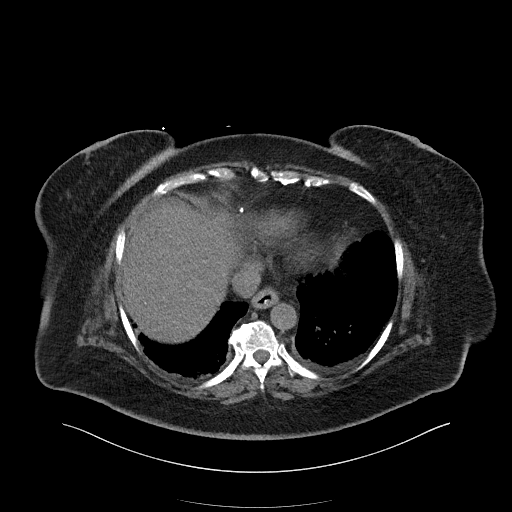
[im 95/99  soft-tissue]
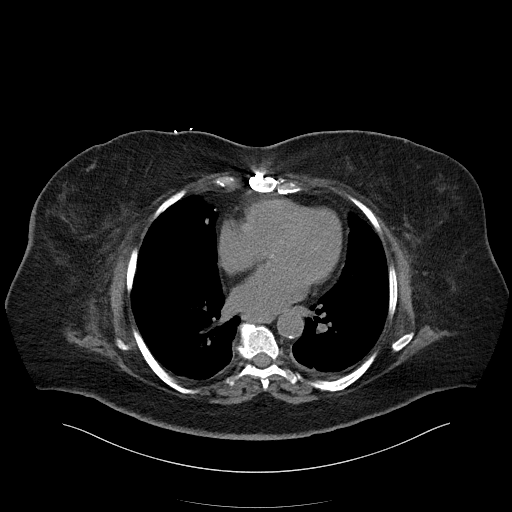

[Series 10: cor st · coronal · 0.99mm/px · 3 of 147 slices shown]
[im 65/147  soft-tissue]
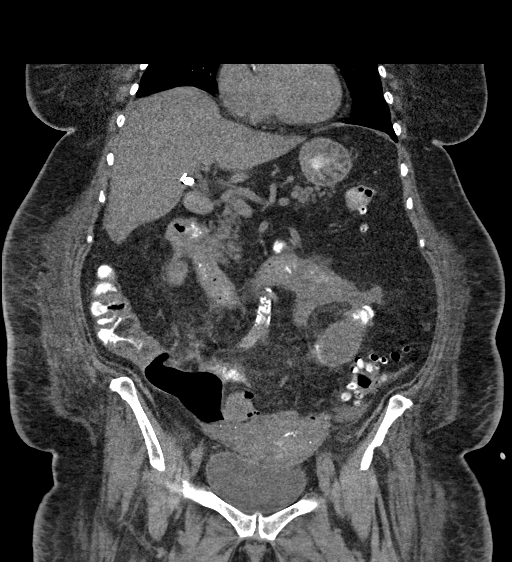
[im 82/147  soft-tissue]
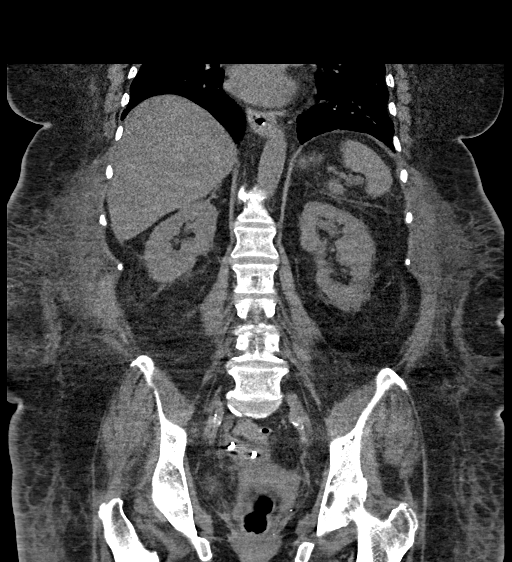
[im 98/147  soft-tissue]
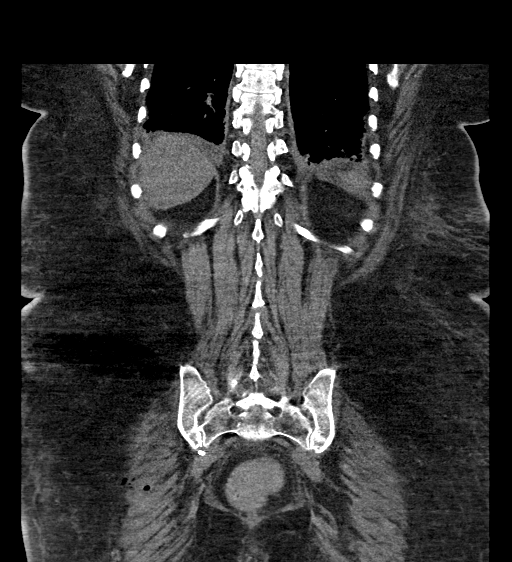

[15 of 46 positions shown; findings below may reference images not displayed]

FINDINGS: Lower chest: Bibasilar atelectasis and tiny pleural effusions.
Calcified granulomata at lung bases.

Hepatobiliary: Gallbladder surgically absent. No focal hepatic
abnormalities.

Pancreas: Normal appearance

Spleen: Normal appearance

Adrenals/Urinary Tract: Adrenal glands normal appearance. Tiny
nonobstructing calculus at inferior pole LEFT kidney. Kidneys,
ureters, and bladder otherwise normal appearance.

Stomach/Bowel: Stomach under distended with suboptimal assessment of
wall thickness. Diffuse diverticulosis of the transverse,
descending, and sigmoid colon. Persistent wall thickening of the
sigmoid colon. Wall thickening also identified at the cecum
extending to the ileocecal valve, slightly increased since previous
study. No evidence of bowel obstruction. Small bowel loops
unremarkable.

Vascular/Lymphatic: Atherosclerotic calcifications aorta and iliac
arteries. No adenopathy

Reproductive: Uterus unremarkable.  Ovaries poorly visualized.

Other: Pigtail drainage catheter in pelvis with minimal residual
fluid at the previously identified large pelvic abscess which was
drained. Pigtail drainage catheter within the mesentery in the mid
abdomen with minimal residual fluid within the previously seen
abscess collection. This residual fluid extends to a second
persistent collection of fluid within the mesentery adjacent to a
small bowel loop, consistent with residual abscess, 4.4 x 3.5 cm
previously 5.1 x 3.6 cm. Additional large gas and minimal fluid
containing collection in the RIGHT pelvis medial and inferior to the
cecum measures 8.2 x 5.0 x 6.8 cm previously 8.2 x 5.9 x 8.0 cm. No
new abscess collections. No free intraperitoneal air. Stranding of
tissue planes in the pelvis again identified with minimal fluid at
the LEFT pericolic gutter and perisplenic.

Musculoskeletal: No acute osseous findings.
IMPRESSION: Interval resolution of large pelvic abscess collection post
drainage.

Minimal residual mesenteric abscess collection post drainage though
the minimal residual fluid appears to communicate with a second
collection in the mesentery in the LEFT mid abdomen adjacent to a
small bowel loop measuring 4.4 x 3.5 cm slightly decreased since
previous exam.

Persistent large extraluminal gas collection in the RIGHT pelvis [DATE]
x 5.0 x 6.8 cm, only slightly decreased.

Diffuse colonic diverticulosis with persistent wall thickening of
the sigmoid colon.
